# Patient Record
Sex: Female | Born: 1940 | Race: White | Hispanic: No | Smoking: Never smoker
Health system: Southern US, Community
[De-identification: ages and names within clinical notes are randomized; demographics above are authoritative.]

## PROBLEM LIST (undated history)

## (undated) DIAGNOSIS — I2510 Atherosclerotic heart disease of native coronary artery without angina pectoris: Secondary | ICD-10-CM

## (undated) DIAGNOSIS — C179 Malignant neoplasm of small intestine, unspecified: Secondary | ICD-10-CM

## (undated) DIAGNOSIS — D519 Vitamin B12 deficiency anemia, unspecified: Secondary | ICD-10-CM

## (undated) DIAGNOSIS — I209 Angina pectoris, unspecified: Secondary | ICD-10-CM

## (undated) DIAGNOSIS — M1990 Unspecified osteoarthritis, unspecified site: Secondary | ICD-10-CM

## (undated) DIAGNOSIS — F329 Major depressive disorder, single episode, unspecified: Secondary | ICD-10-CM

## (undated) DIAGNOSIS — D649 Anemia, unspecified: Secondary | ICD-10-CM

## (undated) DIAGNOSIS — Z8619 Personal history of other infectious and parasitic diseases: Secondary | ICD-10-CM

## (undated) DIAGNOSIS — I219 Acute myocardial infarction, unspecified: Secondary | ICD-10-CM

## (undated) DIAGNOSIS — R51 Headache: Secondary | ICD-10-CM

## (undated) DIAGNOSIS — J189 Pneumonia, unspecified organism: Secondary | ICD-10-CM

## (undated) DIAGNOSIS — C9110 Chronic lymphocytic leukemia of B-cell type not having achieved remission: Secondary | ICD-10-CM

## (undated) DIAGNOSIS — H269 Unspecified cataract: Secondary | ICD-10-CM

## (undated) DIAGNOSIS — M720 Palmar fascial fibromatosis [Dupuytren]: Secondary | ICD-10-CM

## (undated) DIAGNOSIS — Z860100 Personal history of colon polyps, unspecified: Secondary | ICD-10-CM

## (undated) DIAGNOSIS — I97630 Postprocedural hematoma of a circulatory system organ or structure following a cardiac catheterization: Secondary | ICD-10-CM

## (undated) DIAGNOSIS — K5190 Ulcerative colitis, unspecified, without complications: Secondary | ICD-10-CM

## (undated) DIAGNOSIS — K219 Gastro-esophageal reflux disease without esophagitis: Secondary | ICD-10-CM

## (undated) DIAGNOSIS — E119 Type 2 diabetes mellitus without complications: Secondary | ICD-10-CM

## (undated) DIAGNOSIS — I10 Essential (primary) hypertension: Secondary | ICD-10-CM

## (undated) DIAGNOSIS — Z9289 Personal history of other medical treatment: Secondary | ICD-10-CM

## (undated) DIAGNOSIS — E785 Hyperlipidemia, unspecified: Secondary | ICD-10-CM

## (undated) DIAGNOSIS — I872 Venous insufficiency (chronic) (peripheral): Secondary | ICD-10-CM

## (undated) DIAGNOSIS — F32A Depression, unspecified: Secondary | ICD-10-CM

## (undated) DIAGNOSIS — D689 Coagulation defect, unspecified: Secondary | ICD-10-CM

## (undated) DIAGNOSIS — D62 Acute posthemorrhagic anemia: Secondary | ICD-10-CM

## (undated) DIAGNOSIS — Z8719 Personal history of other diseases of the digestive system: Secondary | ICD-10-CM

## (undated) DIAGNOSIS — K2970 Gastritis, unspecified, without bleeding: Secondary | ICD-10-CM

## (undated) DIAGNOSIS — F419 Anxiety disorder, unspecified: Secondary | ICD-10-CM

## (undated) DIAGNOSIS — T7840XA Allergy, unspecified, initial encounter: Secondary | ICD-10-CM

## (undated) DIAGNOSIS — Z9889 Other specified postprocedural states: Secondary | ICD-10-CM

## (undated) HISTORY — DX: Personal history of colonic polyps: Z86.010

## (undated) HISTORY — DX: Coagulation defect, unspecified: D68.9

## (undated) HISTORY — DX: Atherosclerotic heart disease of native coronary artery without angina pectoris: I25.10

## (undated) HISTORY — DX: Palmar fascial fibromatosis (dupuytren): M72.0

## (undated) HISTORY — PX: CARDIAC CATHETERIZATION: SHX172

## (undated) HISTORY — PX: POLYPECTOMY: SHX149

## (undated) HISTORY — DX: Malignant neoplasm of small intestine, unspecified: C17.9

## (undated) HISTORY — DX: Unspecified osteoarthritis, unspecified site: M19.90

## (undated) HISTORY — DX: Venous insufficiency (chronic) (peripheral): I87.2

## (undated) HISTORY — DX: Anxiety disorder, unspecified: F41.9

## (undated) HISTORY — DX: Personal history of colon polyps, unspecified: Z86.0100

## (undated) HISTORY — DX: Ulcerative colitis, unspecified, without complications: K51.90

## (undated) HISTORY — DX: Personal history of other infectious and parasitic diseases: Z86.19

## (undated) HISTORY — DX: Essential (primary) hypertension: I10

## (undated) HISTORY — PX: TONSILLECTOMY: SUR1361

## (undated) HISTORY — PX: FRACTURE SURGERY: SHX138

## (undated) HISTORY — PX: SMALL INTESTINE SURGERY: SHX150

## (undated) HISTORY — PX: EYE SURGERY: SHX253

## (undated) HISTORY — DX: Gastritis, unspecified, without bleeding: K29.70

## (undated) HISTORY — DX: Acute myocardial infarction, unspecified: I21.9

## (undated) HISTORY — DX: Gastro-esophageal reflux disease without esophagitis: K21.9

## (undated) HISTORY — PX: HERNIA REPAIR: SHX51

## (undated) HISTORY — DX: Chronic lymphocytic leukemia of B-cell type not having achieved remission: C91.10

## (undated) HISTORY — DX: Headache: R51

## (undated) HISTORY — PX: COLONOSCOPY: SHX174

## (undated) HISTORY — DX: Allergy, unspecified, initial encounter: T78.40XA

## (undated) HISTORY — DX: Depression, unspecified: F32.A

## (undated) HISTORY — DX: Hyperlipidemia, unspecified: E78.5

## (undated) HISTORY — DX: Unspecified cataract: H26.9

## (undated) HISTORY — DX: Major depressive disorder, single episode, unspecified: F32.9

---

## 1964-01-03 HISTORY — PX: TUBAL LIGATION: SHX77

## 1994-11-03 HISTORY — PX: PATELLA FRACTURE SURGERY: SHX735

## 1994-12-03 HISTORY — PX: TOTAL KNEE ARTHROPLASTY WITH HARDWARE REMOVAL: SHX6437

## 1995-01-03 HISTORY — PX: SHOULDER ARTHROSCOPY W/ ROTATOR CUFF REPAIR: SHX2400

## 1995-01-03 HISTORY — PX: CORONARY ARTERY BYPASS GRAFT: SHX141

## 1997-06-18 ENCOUNTER — Inpatient Hospital Stay (HOSPITAL_COMMUNITY): Admission: EM | Admit: 1997-06-18 | Discharge: 1997-06-22 | Payer: Self-pay | Admitting: Emergency Medicine

## 1997-07-28 ENCOUNTER — Ambulatory Visit (HOSPITAL_COMMUNITY): Admission: RE | Admit: 1997-07-28 | Discharge: 1997-07-28 | Payer: Self-pay | Admitting: Internal Medicine

## 1997-07-31 ENCOUNTER — Ambulatory Visit (HOSPITAL_COMMUNITY): Admission: RE | Admit: 1997-07-31 | Discharge: 1997-07-31 | Payer: Self-pay | Admitting: Internal Medicine

## 1997-09-11 ENCOUNTER — Encounter: Payer: Self-pay | Admitting: Internal Medicine

## 1997-09-11 ENCOUNTER — Ambulatory Visit (HOSPITAL_COMMUNITY): Admission: RE | Admit: 1997-09-11 | Discharge: 1997-09-11 | Payer: Self-pay | Admitting: Internal Medicine

## 1997-11-10 ENCOUNTER — Encounter: Payer: Self-pay | Admitting: Internal Medicine

## 1997-11-10 ENCOUNTER — Ambulatory Visit (HOSPITAL_COMMUNITY): Admission: RE | Admit: 1997-11-10 | Discharge: 1997-11-10 | Payer: Self-pay | Admitting: Internal Medicine

## 1998-01-02 DIAGNOSIS — C179 Malignant neoplasm of small intestine, unspecified: Secondary | ICD-10-CM

## 1998-01-02 HISTORY — DX: Malignant neoplasm of small intestine, unspecified: C17.9

## 1998-01-29 ENCOUNTER — Encounter: Payer: Self-pay | Admitting: Internal Medicine

## 1998-01-29 ENCOUNTER — Ambulatory Visit (HOSPITAL_COMMUNITY): Admission: RE | Admit: 1998-01-29 | Discharge: 1998-01-29 | Payer: Self-pay | Admitting: Internal Medicine

## 1998-04-19 ENCOUNTER — Emergency Department (HOSPITAL_COMMUNITY): Admission: EM | Admit: 1998-04-19 | Discharge: 1998-04-20 | Payer: Self-pay | Admitting: Emergency Medicine

## 1998-04-28 ENCOUNTER — Encounter: Payer: Self-pay | Admitting: Internal Medicine

## 1998-04-28 ENCOUNTER — Inpatient Hospital Stay (HOSPITAL_COMMUNITY): Admission: EM | Admit: 1998-04-28 | Discharge: 1998-05-08 | Payer: Self-pay | Admitting: Emergency Medicine

## 1998-04-29 ENCOUNTER — Encounter: Payer: Self-pay | Admitting: Internal Medicine

## 1998-04-30 ENCOUNTER — Encounter: Payer: Self-pay | Admitting: Internal Medicine

## 1998-05-01 ENCOUNTER — Encounter: Payer: Self-pay | Admitting: Internal Medicine

## 1998-06-03 HISTORY — PX: OTHER SURGICAL HISTORY: SHX169

## 1998-06-07 ENCOUNTER — Encounter: Payer: Self-pay | Admitting: *Deleted

## 1998-06-07 ENCOUNTER — Ambulatory Visit (HOSPITAL_COMMUNITY): Admission: RE | Admit: 1998-06-07 | Discharge: 1998-06-07 | Payer: Self-pay | Admitting: *Deleted

## 1998-11-24 ENCOUNTER — Ambulatory Visit (HOSPITAL_COMMUNITY): Admission: RE | Admit: 1998-11-24 | Discharge: 1998-11-24 | Payer: Self-pay | Admitting: Hematology and Oncology

## 1998-11-24 ENCOUNTER — Encounter: Payer: Self-pay | Admitting: Hematology and Oncology

## 1998-12-24 ENCOUNTER — Encounter: Admission: RE | Admit: 1998-12-24 | Discharge: 1998-12-24 | Payer: Self-pay | Admitting: Hematology and Oncology

## 1998-12-24 ENCOUNTER — Encounter: Payer: Self-pay | Admitting: Hematology and Oncology

## 1998-12-31 ENCOUNTER — Ambulatory Visit (HOSPITAL_BASED_OUTPATIENT_CLINIC_OR_DEPARTMENT_OTHER): Admission: RE | Admit: 1998-12-31 | Discharge: 1998-12-31 | Payer: Self-pay | Admitting: *Deleted

## 1999-05-03 ENCOUNTER — Other Ambulatory Visit: Admission: RE | Admit: 1999-05-03 | Discharge: 1999-05-03 | Payer: Self-pay | Admitting: Gynecology

## 1999-05-26 ENCOUNTER — Encounter: Admission: RE | Admit: 1999-05-26 | Discharge: 1999-05-26 | Payer: Self-pay | Admitting: Hematology and Oncology

## 1999-05-26 ENCOUNTER — Encounter: Payer: Self-pay | Admitting: Hematology and Oncology

## 2000-06-29 ENCOUNTER — Encounter: Payer: Self-pay | Admitting: Internal Medicine

## 2000-06-29 ENCOUNTER — Ambulatory Visit (HOSPITAL_COMMUNITY): Admission: RE | Admit: 2000-06-29 | Discharge: 2000-06-29 | Payer: Self-pay | Admitting: Internal Medicine

## 2000-09-18 ENCOUNTER — Encounter: Admission: RE | Admit: 2000-09-18 | Discharge: 2000-09-18 | Payer: Self-pay | Admitting: Hematology and Oncology

## 2000-09-18 ENCOUNTER — Encounter: Payer: Self-pay | Admitting: Hematology and Oncology

## 2000-10-04 ENCOUNTER — Encounter: Admission: RE | Admit: 2000-10-04 | Discharge: 2000-10-04 | Payer: Self-pay | Admitting: Hematology and Oncology

## 2000-10-04 ENCOUNTER — Encounter: Payer: Self-pay | Admitting: Hematology and Oncology

## 2003-12-30 ENCOUNTER — Ambulatory Visit: Payer: Self-pay | Admitting: Pulmonary Disease

## 2003-12-31 ENCOUNTER — Ambulatory Visit: Payer: Self-pay | Admitting: Pulmonary Disease

## 2004-04-01 ENCOUNTER — Ambulatory Visit: Payer: Self-pay | Admitting: Pulmonary Disease

## 2004-04-06 ENCOUNTER — Ambulatory Visit (HOSPITAL_COMMUNITY): Admission: RE | Admit: 2004-04-06 | Discharge: 2004-04-06 | Payer: Self-pay | Admitting: Cardiovascular Disease

## 2004-10-26 ENCOUNTER — Ambulatory Visit: Payer: Self-pay | Admitting: Pulmonary Disease

## 2004-11-21 ENCOUNTER — Ambulatory Visit: Payer: Self-pay | Admitting: Internal Medicine

## 2004-11-25 ENCOUNTER — Ambulatory Visit: Payer: Self-pay | Admitting: Cardiology

## 2004-12-15 ENCOUNTER — Ambulatory Visit: Payer: Self-pay | Admitting: Internal Medicine

## 2004-12-22 ENCOUNTER — Ambulatory Visit: Payer: Self-pay | Admitting: Internal Medicine

## 2005-01-05 ENCOUNTER — Ambulatory Visit (HOSPITAL_COMMUNITY): Admission: RE | Admit: 2005-01-05 | Discharge: 2005-01-05 | Payer: Self-pay | Admitting: Internal Medicine

## 2005-01-05 ENCOUNTER — Ambulatory Visit: Payer: Self-pay | Admitting: Internal Medicine

## 2005-03-06 ENCOUNTER — Ambulatory Visit: Payer: Self-pay | Admitting: Pulmonary Disease

## 2005-03-20 ENCOUNTER — Ambulatory Visit: Payer: Self-pay | Admitting: Pulmonary Disease

## 2005-11-28 ENCOUNTER — Ambulatory Visit: Payer: Self-pay | Admitting: Pulmonary Disease

## 2006-04-24 ENCOUNTER — Ambulatory Visit: Payer: Self-pay | Admitting: Internal Medicine

## 2006-04-24 LAB — CONVERTED CEMR LAB
Albumin: 4.1 g/dL (ref 3.5–5.2)
BUN: 25 mg/dL — ABNORMAL HIGH (ref 6–23)
Basophils Absolute: 0 10*3/uL (ref 0.0–0.1)
Basophils Relative: 0.4 % (ref 0.0–1.0)
Eosinophils Absolute: 0.1 10*3/uL (ref 0.0–0.6)
GFR calc Af Amer: 64 mL/min
GFR calc non Af Amer: 53 mL/min
Iron: 63 ug/dL (ref 42–145)
Lymphocytes Relative: 42 % (ref 12.0–46.0)
MCHC: 34.4 g/dL (ref 30.0–36.0)
MCV: 90.7 fL (ref 78.0–100.0)
Monocytes Relative: 9.9 % (ref 3.0–11.0)
Neutro Abs: 3.3 10*3/uL (ref 1.4–7.7)
Phosphorus: 4 mg/dL (ref 2.3–4.6)
Platelets: 272 10*3/uL (ref 150–400)
Potassium: 3.8 meq/L (ref 3.5–5.1)
Sodium: 141 meq/L (ref 135–145)

## 2006-04-27 ENCOUNTER — Ambulatory Visit: Payer: Self-pay | Admitting: Cardiology

## 2006-06-04 ENCOUNTER — Ambulatory Visit: Payer: Self-pay | Admitting: Pulmonary Disease

## 2006-06-04 LAB — CONVERTED CEMR LAB
Alkaline Phosphatase: 69 units/L (ref 39–117)
BUN: 22 mg/dL (ref 6–23)
Bilirubin, Direct: 0.1 mg/dL (ref 0.0–0.3)
CO2: 27 meq/L (ref 19–32)
Eosinophils Relative: 1.5 % (ref 0.0–5.0)
GFR calc Af Amer: 71 mL/min
HCT: 31.3 % — ABNORMAL LOW (ref 36.0–46.0)
LDL Cholesterol: 46 mg/dL (ref 0–99)
Monocytes Absolute: 0.6 10*3/uL (ref 0.2–0.7)
Neutro Abs: 3.7 10*3/uL (ref 1.4–7.7)
Platelets: 196 10*3/uL (ref 150–400)
Potassium: 4.4 meq/L (ref 3.5–5.1)
RBC: 3.38 M/uL — ABNORMAL LOW (ref 3.87–5.11)
RDW: 12.8 % (ref 11.5–14.6)
TSH: 0.39 microintl units/mL (ref 0.35–5.50)
Total CHOL/HDL Ratio: 2.9
Total Protein: 6.6 g/dL (ref 6.0–8.3)
Triglycerides: 155 mg/dL — ABNORMAL HIGH (ref 0–149)
WBC: 7 10*3/uL (ref 4.5–10.5)

## 2006-10-16 DIAGNOSIS — Z87898 Personal history of other specified conditions: Secondary | ICD-10-CM

## 2006-10-16 DIAGNOSIS — Z951 Presence of aortocoronary bypass graft: Secondary | ICD-10-CM

## 2006-10-16 DIAGNOSIS — E785 Hyperlipidemia, unspecified: Secondary | ICD-10-CM | POA: Insufficient documentation

## 2006-10-16 DIAGNOSIS — M1990 Unspecified osteoarthritis, unspecified site: Secondary | ICD-10-CM

## 2006-10-16 DIAGNOSIS — Z794 Long term (current) use of insulin: Secondary | ICD-10-CM

## 2006-10-16 DIAGNOSIS — K219 Gastro-esophageal reflux disease without esophagitis: Secondary | ICD-10-CM | POA: Insufficient documentation

## 2006-10-16 DIAGNOSIS — E1122 Type 2 diabetes mellitus with diabetic chronic kidney disease: Secondary | ICD-10-CM | POA: Insufficient documentation

## 2006-10-16 DIAGNOSIS — I10 Essential (primary) hypertension: Secondary | ICD-10-CM | POA: Insufficient documentation

## 2006-10-16 DIAGNOSIS — E118 Type 2 diabetes mellitus with unspecified complications: Secondary | ICD-10-CM

## 2006-12-20 ENCOUNTER — Ambulatory Visit: Payer: Self-pay | Admitting: Pulmonary Disease

## 2006-12-20 DIAGNOSIS — I872 Venous insufficiency (chronic) (peripheral): Secondary | ICD-10-CM | POA: Insufficient documentation

## 2006-12-20 DIAGNOSIS — C179 Malignant neoplasm of small intestine, unspecified: Secondary | ICD-10-CM | POA: Insufficient documentation

## 2006-12-20 DIAGNOSIS — D126 Benign neoplasm of colon, unspecified: Secondary | ICD-10-CM | POA: Insufficient documentation

## 2007-01-17 ENCOUNTER — Encounter: Payer: Self-pay | Admitting: Pulmonary Disease

## 2007-02-15 LAB — CONVERTED CEMR LAB
Albumin: 3.9 g/dL (ref 3.5–5.2)
Alkaline Phosphatase: 73 units/L (ref 39–117)
BUN: 20 mg/dL (ref 6–23)
Basophils Absolute: 0 10*3/uL (ref 0.0–0.1)
Cholesterol: 129 mg/dL (ref 0–200)
Direct LDL: 55.8 mg/dL
GFR calc Af Amer: 71 mL/min
Hemoglobin: 12.6 g/dL (ref 12.0–15.0)
Hgb A1c MFr Bld: 7.2 % — ABNORMAL HIGH (ref 4.6–6.0)
Iron: 66 ug/dL (ref 42–145)
Lymphocytes Relative: 37.5 % (ref 12.0–46.0)
MCHC: 35.1 g/dL (ref 30.0–36.0)
MCV: 92.1 fL (ref 78.0–100.0)
Monocytes Absolute: 0.5 10*3/uL (ref 0.2–0.7)
Monocytes Relative: 7.6 % (ref 3.0–11.0)
Neutro Abs: 3.5 10*3/uL (ref 1.4–7.7)
Potassium: 4.4 meq/L (ref 3.5–5.1)
Total CHOL/HDL Ratio: 3.5
Total Protein: 7 g/dL (ref 6.0–8.3)

## 2007-06-21 ENCOUNTER — Ambulatory Visit: Payer: Self-pay | Admitting: Pulmonary Disease

## 2007-06-22 LAB — CONVERTED CEMR LAB
ALT: 13 units/L (ref 0–35)
Albumin: 4 g/dL (ref 3.5–5.2)
BUN: 17 mg/dL (ref 6–23)
Basophils Relative: 0.2 % (ref 0.0–1.0)
Bilirubin, Direct: 0.1 mg/dL (ref 0.0–0.3)
CO2: 29 meq/L (ref 19–32)
Calcium: 9.4 mg/dL (ref 8.4–10.5)
Cholesterol: 113 mg/dL (ref 0–200)
Creatinine, Ser: 1 mg/dL (ref 0.4–1.2)
GFR calc Af Amer: 71 mL/min
Glucose, Bld: 210 mg/dL — ABNORMAL HIGH (ref 70–99)
HCT: 33.8 % — ABNORMAL LOW (ref 36.0–46.0)
Hemoglobin: 11.9 g/dL — ABNORMAL LOW (ref 12.0–15.0)
Hgb A1c MFr Bld: 8 % — ABNORMAL HIGH (ref 4.6–6.0)
Monocytes Absolute: 0.6 10*3/uL (ref 0.1–1.0)
Monocytes Relative: 7.6 % (ref 3.0–12.0)
Neutro Abs: 4.6 10*3/uL (ref 1.4–7.7)
RBC: 3.67 M/uL — ABNORMAL LOW (ref 3.87–5.11)
RDW: 12.1 % (ref 11.5–14.6)
Sodium: 143 meq/L (ref 135–145)
TSH: 0.38 microintl units/mL (ref 0.35–5.50)
Total CHOL/HDL Ratio: 3.3
Total Protein: 6.8 g/dL (ref 6.0–8.3)
Triglycerides: 162 mg/dL — ABNORMAL HIGH (ref 0–149)

## 2007-06-24 ENCOUNTER — Encounter: Payer: Self-pay | Admitting: Pulmonary Disease

## 2007-08-01 ENCOUNTER — Telehealth (INDEPENDENT_AMBULATORY_CARE_PROVIDER_SITE_OTHER): Payer: Self-pay | Admitting: *Deleted

## 2007-08-30 ENCOUNTER — Ambulatory Visit: Payer: Self-pay | Admitting: Internal Medicine

## 2007-09-02 LAB — CONVERTED CEMR LAB
Bilirubin Urine: NEGATIVE
Hemoglobin, Urine: NEGATIVE
Ketones, ur: NEGATIVE mg/dL
Leukocytes, UA: NEGATIVE
Nitrite: NEGATIVE
Urobilinogen, UA: 0.2 (ref 0.0–1.0)

## 2007-09-17 ENCOUNTER — Ambulatory Visit: Payer: Self-pay | Admitting: Pulmonary Disease

## 2007-09-20 LAB — CONVERTED CEMR LAB
Fecal Occult Blood: NEGATIVE
OCCULT 3: NEGATIVE

## 2007-11-14 ENCOUNTER — Encounter: Payer: Self-pay | Admitting: Pulmonary Disease

## 2007-12-09 ENCOUNTER — Ambulatory Visit: Payer: Self-pay | Admitting: Pulmonary Disease

## 2008-06-29 ENCOUNTER — Ambulatory Visit: Payer: Self-pay | Admitting: Internal Medicine

## 2008-06-30 ENCOUNTER — Ambulatory Visit: Payer: Self-pay | Admitting: Adult Health

## 2008-07-02 LAB — CONVERTED CEMR LAB
AST: 22 units/L (ref 0–37)
Albumin: 3.7 g/dL (ref 3.5–5.2)
Basophils Relative: 0.4 % (ref 0.0–3.0)
CO2: 28 meq/L (ref 19–32)
Chloride: 108 meq/L (ref 96–112)
Cholesterol: 128 mg/dL (ref 0–200)
Direct LDL: 51 mg/dL
Eosinophils Relative: 2 % (ref 0.0–5.0)
HCT: 34.2 % — ABNORMAL LOW (ref 36.0–46.0)
Hgb A1c MFr Bld: 7.9 % — ABNORMAL HIGH (ref 4.6–6.5)
Lymphs Abs: 2.4 10*3/uL (ref 0.7–4.0)
MCV: 94 fL (ref 78.0–100.0)
Monocytes Absolute: 0.6 10*3/uL (ref 0.1–1.0)
Monocytes Relative: 9.5 % (ref 3.0–12.0)
Neutrophils Relative %: 51.1 % (ref 43.0–77.0)
Nitrite: NEGATIVE
Platelets: 175 10*3/uL (ref 150.0–400.0)
Potassium: 4.8 meq/L (ref 3.5–5.1)
RBC: 3.64 M/uL — ABNORMAL LOW (ref 3.87–5.11)
Specific Gravity, Urine: 1.02 (ref 1.000–1.030)
TSH: 0.95 microintl units/mL (ref 0.35–5.50)
Total Bilirubin: 0.6 mg/dL (ref 0.3–1.2)
Total CHOL/HDL Ratio: 3
Total Protein, Urine: NEGATIVE mg/dL
Triglycerides: 298 mg/dL — ABNORMAL HIGH (ref 0.0–149.0)
VLDL: 59.6 mg/dL — ABNORMAL HIGH (ref 0.0–40.0)
WBC: 6.6 10*3/uL (ref 4.5–10.5)
pH: 5.5 (ref 5.0–8.0)

## 2008-07-28 ENCOUNTER — Encounter: Payer: Self-pay | Admitting: Internal Medicine

## 2008-08-15 ENCOUNTER — Inpatient Hospital Stay (HOSPITAL_COMMUNITY): Admission: EM | Admit: 2008-08-15 | Discharge: 2008-08-17 | Payer: Self-pay | Admitting: Emergency Medicine

## 2008-08-15 ENCOUNTER — Encounter (INDEPENDENT_AMBULATORY_CARE_PROVIDER_SITE_OTHER): Payer: Self-pay | Admitting: *Deleted

## 2008-08-15 ENCOUNTER — Ambulatory Visit: Payer: Self-pay | Admitting: Pulmonary Disease

## 2008-08-17 ENCOUNTER — Telehealth (INDEPENDENT_AMBULATORY_CARE_PROVIDER_SITE_OTHER): Payer: Self-pay | Admitting: *Deleted

## 2008-08-17 ENCOUNTER — Telehealth: Payer: Self-pay | Admitting: Internal Medicine

## 2008-08-27 DIAGNOSIS — Z8719 Personal history of other diseases of the digestive system: Secondary | ICD-10-CM

## 2008-08-27 DIAGNOSIS — K439 Ventral hernia without obstruction or gangrene: Secondary | ICD-10-CM | POA: Insufficient documentation

## 2008-09-01 ENCOUNTER — Ambulatory Visit: Payer: Self-pay | Admitting: Internal Medicine

## 2008-09-02 HISTORY — PX: LAPAROSCOPIC ASSISTED VENTRAL HERNIA REPAIR: SHX6312

## 2008-09-02 LAB — CONVERTED CEMR LAB
Ferritin: 30.4 ng/mL (ref 10.0–291.0)
Iron: 63 ug/dL (ref 42–145)

## 2008-09-09 ENCOUNTER — Ambulatory Visit (HOSPITAL_COMMUNITY): Admission: RE | Admit: 2008-09-09 | Discharge: 2008-09-09 | Payer: Self-pay | Admitting: Internal Medicine

## 2008-09-11 ENCOUNTER — Encounter: Payer: Self-pay | Admitting: Internal Medicine

## 2008-09-11 ENCOUNTER — Encounter: Payer: Self-pay | Admitting: Pulmonary Disease

## 2008-09-18 ENCOUNTER — Encounter: Admission: RE | Admit: 2008-09-18 | Discharge: 2008-09-18 | Payer: Self-pay | Admitting: Surgery

## 2008-09-22 ENCOUNTER — Inpatient Hospital Stay (HOSPITAL_COMMUNITY): Admission: RE | Admit: 2008-09-22 | Discharge: 2008-09-27 | Payer: Self-pay | Admitting: Surgery

## 2008-09-22 ENCOUNTER — Ambulatory Visit: Payer: Self-pay | Admitting: Pulmonary Disease

## 2008-09-30 ENCOUNTER — Telehealth (INDEPENDENT_AMBULATORY_CARE_PROVIDER_SITE_OTHER): Payer: Self-pay | Admitting: *Deleted

## 2008-10-14 ENCOUNTER — Encounter: Payer: Self-pay | Admitting: Internal Medicine

## 2008-11-05 ENCOUNTER — Ambulatory Visit: Payer: Self-pay | Admitting: Pulmonary Disease

## 2008-11-05 LAB — CONVERTED CEMR LAB
AST: 26 units/L (ref 0–37)
Alkaline Phosphatase: 53 units/L (ref 39–117)
Basophils Absolute: 0.1 10*3/uL (ref 0.0–0.1)
Calcium: 9.7 mg/dL (ref 8.4–10.5)
Eosinophils Relative: 1.7 % (ref 0.0–5.0)
GFR calc non Af Amer: 39.61 mL/min (ref >60)
Glucose, Bld: 189 mg/dL — ABNORMAL HIGH (ref 70–99)
HDL: 48.9 mg/dL (ref >39.00)
Hemoglobin: 11.8 g/dL — ABNORMAL LOW (ref 12.0–15.0)
Lymphocytes Relative: 33.5 % (ref 12.0–46.0)
Monocytes Relative: 5.8 % (ref 3.0–12.0)
Neutro Abs: 6 10*3/uL (ref 1.4–7.7)
Platelets: 241 10*3/uL (ref 150.0–400.0)
RDW: 12.4 % (ref 11.5–14.6)
Sodium: 141 meq/L (ref 135–145)
Total Bilirubin: 0.7 mg/dL (ref 0.3–1.2)
Total CHOL/HDL Ratio: 4
Triglycerides: 347 mg/dL — ABNORMAL HIGH (ref 0.0–149.0)
VLDL: 69.4 mg/dL — ABNORMAL HIGH (ref 0.0–40.0)
WBC: 10.4 10*3/uL (ref 4.5–10.5)

## 2008-11-17 ENCOUNTER — Telehealth: Payer: Self-pay | Admitting: Pulmonary Disease

## 2008-11-17 LAB — CONVERTED CEMR LAB: Vit D, 25-Hydroxy: 25 ng/mL — ABNORMAL LOW (ref 30–89)

## 2008-11-18 ENCOUNTER — Telehealth: Payer: Self-pay | Admitting: Pulmonary Disease

## 2008-12-04 ENCOUNTER — Encounter: Payer: Self-pay | Admitting: Internal Medicine

## 2008-12-21 ENCOUNTER — Encounter: Payer: Self-pay | Admitting: Pulmonary Disease

## 2009-05-04 ENCOUNTER — Ambulatory Visit: Payer: Self-pay | Admitting: Pulmonary Disease

## 2009-05-05 ENCOUNTER — Ambulatory Visit: Payer: Self-pay | Admitting: Pulmonary Disease

## 2009-05-09 LAB — CONVERTED CEMR LAB
Albumin: 3.8 g/dL (ref 3.5–5.2)
Basophils Relative: 0.3 % (ref 0.0–3.0)
Chloride: 105 meq/L (ref 96–112)
Cholesterol: 147 mg/dL (ref 0–200)
Eosinophils Relative: 1.8 % (ref 0.0–5.0)
HCT: 33 % — ABNORMAL LOW (ref 36.0–46.0)
HDL: 40.3 mg/dL (ref >39.00)
Hgb A1c MFr Bld: 7.4 % — ABNORMAL HIGH (ref 4.6–6.5)
Lymphs Abs: 4.5 10*3/uL — ABNORMAL HIGH (ref 0.7–4.0)
MCV: 93.4 fL (ref 78.0–100.0)
Monocytes Absolute: 0.6 10*3/uL (ref 0.1–1.0)
Neutrophils Relative %: 32.7 % — ABNORMAL LOW (ref 43.0–77.0)
Potassium: 4.7 meq/L (ref 3.5–5.1)
RBC: 3.53 M/uL — ABNORMAL LOW (ref 3.87–5.11)
Sodium: 142 meq/L (ref 135–145)
Total Protein: 6.4 g/dL (ref 6.0–8.3)
VLDL: 61.4 mg/dL — ABNORMAL HIGH (ref 0.0–40.0)
WBC: 7.7 10*3/uL (ref 4.5–10.5)

## 2009-05-13 ENCOUNTER — Encounter: Payer: Self-pay | Admitting: Pulmonary Disease

## 2009-05-19 ENCOUNTER — Ambulatory Visit: Payer: Self-pay | Admitting: Internal Medicine

## 2009-05-20 ENCOUNTER — Ambulatory Visit: Payer: Self-pay | Admitting: Internal Medicine

## 2009-05-20 DIAGNOSIS — E539 Vitamin B deficiency, unspecified: Secondary | ICD-10-CM

## 2009-06-02 ENCOUNTER — Ambulatory Visit: Payer: Self-pay | Admitting: Internal Medicine

## 2009-06-03 ENCOUNTER — Encounter: Payer: Self-pay | Admitting: Internal Medicine

## 2009-06-16 ENCOUNTER — Ambulatory Visit: Payer: Self-pay | Admitting: Gastroenterology

## 2009-06-16 ENCOUNTER — Telehealth (INDEPENDENT_AMBULATORY_CARE_PROVIDER_SITE_OTHER): Payer: Self-pay | Admitting: *Deleted

## 2009-07-12 ENCOUNTER — Telehealth (INDEPENDENT_AMBULATORY_CARE_PROVIDER_SITE_OTHER): Payer: Self-pay | Admitting: *Deleted

## 2009-07-15 ENCOUNTER — Ambulatory Visit: Payer: Self-pay | Admitting: Gastroenterology

## 2009-07-30 ENCOUNTER — Encounter: Payer: Self-pay | Admitting: Pulmonary Disease

## 2009-08-16 ENCOUNTER — Ambulatory Visit: Payer: Self-pay | Admitting: Internal Medicine

## 2009-08-18 LAB — CONVERTED CEMR LAB
CEA: 0.9 ng/mL (ref 0.0–5.0)
Saturation Ratios: 23.9 % (ref 20.0–50.0)
Vitamin B-12: 250 pg/mL (ref 211–911)

## 2009-09-17 ENCOUNTER — Ambulatory Visit: Payer: Self-pay | Admitting: Internal Medicine

## 2009-10-13 ENCOUNTER — Encounter: Admission: RE | Admit: 2009-10-13 | Discharge: 2009-10-13 | Payer: Self-pay | Admitting: Pulmonary Disease

## 2009-10-15 ENCOUNTER — Ambulatory Visit: Payer: Self-pay | Admitting: Internal Medicine

## 2009-11-16 ENCOUNTER — Telehealth: Payer: Self-pay | Admitting: Internal Medicine

## 2009-11-16 ENCOUNTER — Ambulatory Visit: Payer: Self-pay | Admitting: Internal Medicine

## 2009-11-22 ENCOUNTER — Encounter: Payer: Self-pay | Admitting: Internal Medicine

## 2009-11-22 ENCOUNTER — Encounter: Payer: Self-pay | Admitting: Pulmonary Disease

## 2009-12-16 ENCOUNTER — Ambulatory Visit: Payer: Self-pay | Admitting: Internal Medicine

## 2009-12-31 ENCOUNTER — Ambulatory Visit: Payer: Self-pay | Admitting: Pulmonary Disease

## 2010-01-14 ENCOUNTER — Ambulatory Visit
Admission: RE | Admit: 2010-01-14 | Discharge: 2010-01-14 | Payer: Self-pay | Source: Home / Self Care | Attending: Internal Medicine | Admitting: Internal Medicine

## 2010-02-01 NOTE — Assessment & Plan Note (Signed)
Summary: MONTHLY B12 SHOT...LSW  Nurse Visit   Allergies: 1)  ! Advil 2)  Codeine 3)  * Aleve 4)  Demerol  Medication Administration  Injection # 1:    Medication: Vit B12 1000 mcg    Diagnosis: VITAMIN B12 DEFICIENCY (ICD-266.2)    Route: IM    Site: L deltoid    Exp Date: 12/2011    Lot #: [id number]    Mfr: American Regent    Patient tolerated injection without complications    Given by: Christie Nottingham CMA (AAMA) (July 15, 2009 10:01 AM)  Orders Added: 1)  Vit B12 1000 mcg [J3420]

## 2010-02-01 NOTE — Letter (Signed)
Summary: Patient Notice- Polyp Results  [facility]  [address] [place], [place] [address]   Phone: [phone]  Fax: [phone]        June 03, 2009 MRN: [id number]    [facility] [address] [place], [place]  [address]    Dear Ms. Rosalene Billings,  I am pleased to inform you that the colon polyp(s) removed during your recent colonoscopy was (were) found to be benign (no cancer detected) upon pathologic examination.The polyp is tubular adenoma ( precancerous)  I recommend you have a repeat colonoscopy examination in 5_ years to look for recurrent polyps, as having colon polyps increases your risk for having recurrent polyps or even colon cancer in the future.  Should you develop new or worsening symptoms of abdominal pain, bowel habit changes or bleeding from the rectum or bowels, please schedule an evaluation with either your primary care physician or with me.  Additional information/recommendations:  _x_ No further action with gastroenterology is needed at this time. Please      follow-up with your primary care physician for your other healthcare      needs.  __ Please call [phone] to schedule a return visit to review your      situation.  __ Please keep your follow-up visit as already scheduled.  __ Continue treatment plan as outlined the day of your exam.  Please call us if you are having persistent problems or have questions about your condition that have not been fully answered at this time.  Sincerely,  Hart Carwin MD  This letter has been electronically signed by your physician.  Appended Document: Patient Notice- Polyp Results Recall is in IDX for 06/2014. Letter mailed to patient.

## 2010-02-01 NOTE — Progress Notes (Signed)
Summary: Lab Results   Phone Note Call from Patient Call back at Home Phone [phone]   Caller: Patient Call For: Dr. Juanda Chance Reason for Call: Talk to Nurse Details for Reason: Lab Results Summary of Call: Pt. called to see if lab results were available. Pt. feels the B12 injections are not working for her.  Initial call taken by: Schuyler Amor,  November 16, 2009 2:41 PM  Follow-up for Phone Call        I have left a message on patient's voicemail that b12 levels are not available to [place] yet and Dr Juanda Chance must review them once they are back and that may take 1-2 days. Patient advised we will call her when results are available and have been reviewed at which time we can give her recommendations. Follow-up by: Lamona Curl CMA Duncan Dull),  November 16, 2009 3:04 PM

## 2010-02-01 NOTE — Letter (Signed)
Summary: [facility]  [facility]   Imported By: Sherian Rein 06/01/2009 13:26:54  _____________________________________________________________________  External Attachment:    Type:   Image     Comment:   External Document

## 2010-02-01 NOTE — Assessment & Plan Note (Signed)
Summary: B12 SHOT  Nurse Visit   Allergies: 1)  ! Advil 2)  Codeine 3)  * Aleve 4)  Demerol  Medication Administration  Injection # 1:    Medication: Vit B12 1000 mcg    Diagnosis: VITAMIN B12 DEFICIENCY (ICD-266.2)    Route: IM    Site: L deltoid    Exp Date: 7/13    Lot #: [id number]    Mfr: American Regent    Patient tolerated injection without complications    Given by: Lamona Curl CMA (AAMA) (September 17, 2009 11:37 AM)  Orders Added: 1)  Vit B12 1000 mcg [J3420]   Medication Administration  Injection # 1:    Medication: Vit B12 1000 mcg    Diagnosis: VITAMIN B12 DEFICIENCY (ICD-266.2)    Route: IM    Site: L deltoid    Exp Date: 7/13    Lot #: [id number]    Mfr: American Regent    Patient tolerated injection without complications    Given by: Lamona Curl CMA (AAMA) (September 17, 2009 11:37 AM)  Orders Added: 1)  Vit B12 1000 mcg [J3420]

## 2010-02-01 NOTE — Assessment & Plan Note (Signed)
Summary: rov 6 months///kp   CC:  6 month follow up--needs rx for cloraZepate and actos--both ears are stopped up and drain at night--abd pain at times that hurts most all of the time.  History of Present Illness: 70 y/o WF here for a follow up visit... she has mult med problems as noted below...   ~  November 05, 2008:  she was last seen in office 6/09 & hosp x2 recently- 8/13-16 and 9/21-26/10 w/ ventral hernia containing incarcerated colon... had lap ventral hernia repair by [facility] on 09/22/08... she has improved but still weak, wt down 5#, feeling sl depressed & asking for Lexapro Rx... she's already had the 2010 flu vaccine...    ~  May 04, 2009:  17mo f/u & feeling better overall but still has some GI issuesand needs ROV w/ DrDBrodie... hasn't been taking Prilosec & will write for OMEP40 daily before dinner... also c/o hear discomfort & exam shows 4+wax w/ prev disimpaction by [facility] & she will f/u w/ him as well... she denies CP, BP controlled on meds, due for f/u FLP & DM labs- she wants to start back on Actos she says...    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 denies CP, palpit, syncope, edema, etc.  ~  Myoview 12/08 showed perfusion defect w/ infarct/ scar & perinfarct ischemia...  ~  subseq Cath 12/08 showed progressive CIRC dis & patent grafts- med Rx...  ~  seen by [name]/ [facility] 9/10 w/ Myoview & 2DEcho- cleared for ventral hernia surg...  HYPERTENSION (ICD-401.9) - on ATENOLOL 50mg /d, ALTACE 10mg /d, HCTZ 25mg /d... BP= 140/80, feeling better, taking meds regularly... denies HA, visual changes, CP, palipit, syncope, dyspnea, edema, etc...   VENOUS INSUFFICIENCY (ICD-459.81) - on low sodium diet, elevates legs, wear support hose when able...  HYPERLIPIDEMIA (ICD-272.4) - on SIMVASTATIN 40mg /d & NIASPAN 500mg /d... tolerates meds fair & takes her ASA prior  to the Niacin Qhs... prev on Lip20 but stopped due to $$...  ~  FLP 6/08 on Lip20 showed TChol 118, TG 155, HDL 41, LDL 46  ~  FLP 12/08 on Lip20 showed TChol 129, TG 201, HDL 37, LDL 56  ~  FLP 6/09 on Lip20 showed TChol 113, TG 162, HDL 34, LDL 47  ~  FLP 11/10 on Simva40+Niasp500 showed TChol 183, TG 347, HDL 49, LDL 72...  ~  FLP 5/11 on Simva40+Niasp500 showed   DM (ICD-250.00) - on METFORMIN 1000mg Bid, & AMARYL 4mg /d...  ~  labs 6/08 showed BS 110, HgA1C 6.6  ~  labs 12/08 showed BS= 170, HgA1c= 7.2 (she was up 10# in weight)  ~  labs 6/09 showed BS= 210, A1c= 8.0.Marland Kitchen. rec> add Actos 15mg /d but pt never did due to $$.  ~  labs 11/10 showed BS= 189, A1c= 7.6  ~  she refuses Januvia- "it made my sugar go higher"  ~  5/11> she is asking to restart ACTOS 30mg /d because home BS= 150-200 range.  ~  labs 5/11 showed > pending  GERD (ICD-530.81) - prev on Prilosec OTC Prn- rec ch to OMEPRAZOLE 40mg /d before dinner...  ~  last EGD 12/06 by DrDBrodie showed mild antral gastritis, RUT neg, ?from ASA... Rx'd Pepcid 40mg /d.  ~  5/11:  c/o indigestion in eves> rec Omep40 before dinner & antireflux regimen... f/u w/ GI.  COLONIC POLYPS (ICD-211.3)  ~  colonoscopy 12/06 by DrDBrodie showed 8mm  polyp in sigmoid area= ?path not avail...  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 post op oncology DrShinn w/ adjuvant chemotherapy- 5-FU/ Leucovorin x 26weeks finished 12/00...  ~  she has been followed by DrDBrodie since DrShinn left [place]...  ~  small bowel capsule endoscopy 1/07 was neg- no recurrent tumor seen, 2 tiny AVM's noted...  ~  CT Abd/ Pelvis 8/10 w/ large ventral hernia containing transverse colon & mesentery- no obstruction, ?hep steatosis,  lumbar DJD.  ~  UGI & Sm Bowel Series 9/10 showed no HH or reflux seem, ventral hernia w/ transverse colon, no obstruction or mass in sm bowel.  HERNIA, VENTRAL (ICD-553.20) ** see 9/10 Hosp>> lap ventral hernia repairw/ reduction incarcerated colon...  DEGENERATIVE JOINT DISEASE (ICD-715.90) - she is s/p left knee arthroscopy 1996...  HEADACHE (ICD-784.0) - hx mixed HA's in the past...  DEPRESSION (ICD-311) - husb passed away several yrs ago...  ~  11/10: pt requesting LEXAPRO 10mg /d... in f/u she notes improvement & decided to stop Rx.  SHINGLES, HX OF (ICD-V13.8)  ANEMIA, HX OF (ICD-V12.3)  ~  labs in hosp 9/10 showed Hg down to 9.7 at disch...  ~  labs 11/10 showed Hg= 11.8  ~  labs 5/11 showed    Allergies: 1)  ! Advil 2)  Codeine 3)  * Aleve 4)  Demerol  Comments:  Nurse/Medical Assistant: The patient's medications and allergies were reviewed with the patient and were updated in the Medication and Allergy Lists.  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 mother died age 31 from heart attack father died age 77 from massive heart attack 1 sibling 1 brother died age 27 from emphysema Family History Rheumatoid Arthritis No FH of Colon Cancer:  Social  History: Reviewed history from 08/27/2008 and no changes required. retired widowed 2 children never smoked exercises 3 x a week drinks caffeinated sodas  Review of Systems      See HPI       The patient complains of decreased hearing, dyspnea on exertion, and severe indigestion/heartburn.  The patient denies anorexia, fever, weight loss, weight gain, vision loss, hoarseness, chest pain, syncope, peripheral edema, prolonged cough, headaches, hemoptysis, abdominal pain, melena, hematochezia, hematuria, incontinence, muscle weakness, suspicious skin lesions, transient blindness, difficulty walking, depression, unusual weight change, abnormal bleeding, enlarged lymph nodes, and angioedema.    Vital Signs:  Patient profile:   70 year old female Height:      66.5 inches Weight:      160.50 pounds BMI:     25.61 O2 Sat:      98 % on Room air Temp:     97.1 degrees F oral Pulse rate:   73 / minute BP sitting:   140 / 80  (left arm) Cuff  size:   regular  Vitals Entered By: Randell Loop CMA (May 04, 2009 11:35 AM)  O2 Sat at Rest %:  98 O2 Flow:  Room air CC: 6 month follow up--needs rx for cloraZepate and actos--both ears are stopped up and drain at night--abd pain at times that hurts most all of the time Is Patient Diabetic? Yes Pain Assessment Patient in pain? yes      Onset of pain  abd pain at times Comments meds updated today   Physical Exam  Additional Exam:  WD, WN, 70 y/o WF in NAD... GENERAL:  Alert & oriented; pleasant & cooperative... HEENT:  [place]/AT, EOM-wnl, PERRLA, EACs- sl red w/ cerumen; NOSE-clear, THROAT-mild redness, no exudate NECK:  Supple w/ adeq ROM; no JVD; normal carotid impulses w/o bruits; no thyromegaly or nodules palpated; no lymphadenopathy. CHEST:  Clear to P & A; without wheezes/ rales/ or rhonchi heard... HEART:  Regular Rhythm; without murmurs/ rubs/ or gallops detected... ABDOMEN: scar of prev surg, abd panniculus, normal bowel sounds; no  organomegaly or masses palpated... EXT: mild dupytren's scarring in both palms, no obvious arthritic changes, no varicose veins/ venous insuffic/ or edema. NEURO:  CN's intact; motor testing normal; sensory testing normal; gait normal & balance OK. DERM:  No lesions noted; no rash etc...    MISC. Report  Procedure date:  05/04/2009  Findings:      Pt will return to our lab one morning this week for FASTINg blood work & we will notify her of the results...     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 continue Rx, diet, exercise. Her updated medication list for this problem includes:    Aspirin 81 Mg Tbec (Aspirin) .Marland Kitchen... Take by mouth once daily    Plavix 75 Mg Tabs (Clopidogrel bisulfate) .Marland Kitchen... Take 1 tablet by mouth once a day    Atenolol 50 Mg Tabs (Atenolol) .Marland Kitchen... Take by mouth once daily    Altace 10 Mg Tabs (Ramipril) .Marland Kitchen... Take by mouth once daily    Hydrochlorothiazide 25 Mg Tabs (Hydrochlorothiazide) .Marland Kitchen... Take by mouth once daily  Problem # 2:  HYPERTENSION (ICD-401.9) Controlled on meds-  continue same + better diet, exercise. Her updated medication list for this problem includes:    Atenolol 50 Mg Tabs (Atenolol) .Marland Kitchen... Take by mouth once daily    Altace 10 Mg Tabs (Ramipril) .Marland Kitchen... Take by mouth once daily    Hydrochlorothiazide 25 Mg Tabs (Hydrochlorothiazide) .Marland Kitchen... Take by mouth once daily  Problem # 3:  HYPERLIPIDEMIA (ICD-272.4) Due for f/u FLP & will return later this week. Her updated medication list for this problem includes:    Simvastatin 40 Mg Tabs (Simvastatin) ..... One tablet by mouth once daily    Niaspan 500 Mg Tbcr (Niacin (antihyperlipidemic)) .Marland Kitchen... Take by mouth once daily  Problem # 4:  DM (ICD-250.00) Due for f/u labs and she is asking to restart the ACTOS 30mg /d... Her updated medication list for this problem includes:    Aspirin 81 Mg Tbec  (Aspirin) .Marland Kitchen... Take by mouth once daily    Altace 10 Mg Tabs (Ramipril) .Marland Kitchen... Take by mouth once daily    Metformin Hcl 1000 Mg Tabs (Metformin hcl) .Marland Kitchen... Take by mouth two times a day    Glimepiride 4 Mg Tabs (Glimepiride) .Marland Kitchen... Take by mouth once daily    Actos 30 Mg Tabs (Pioglitazone hcl) .Marland Kitchen... Take 1 tab by  mouth once daily...  Orders: Prescription Created Electronically [phone])  Problem # 5:  GERD (ICD-530.81) We discussed starting OMEPRAZOLE 40mg  before dinner & due for f/u w/ GI DrDBrodie... Her updated medication list for this problem includes:    Omeprazole 40 Mg Cpdr (Omeprazole) .Marland Kitchen... Take 1 cap by mouth once daily 30 min before dinner...  Orders: Gastroenterology Referral (GI)  Problem # 6:  COLONIC POLYPS (ICD-211.3) Last colon was 12/06 w/ f/u planned 46yrs...  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  Problem # 8:  OTHER MEDICAL PROBLEMS AS NOTED>>>  Complete Medication List: 1)  Aspirin 81 Mg Tbec (Aspirin) .... Take by mouth once daily 2)  Plavix 75 Mg Tabs (Clopidogrel bisulfate) .... Take 1 tablet by mouth once a day 3)  Atenolol 50 Mg Tabs (Atenolol) .... Take by mouth once daily 4)  Altace 10 Mg Tabs (Ramipril) .... Take by mouth once daily 5)  Hydrochlorothiazide 25 Mg Tabs (Hydrochlorothiazide) .... Take by mouth once daily 6)  Metformin Hcl 1000 Mg Tabs (Metformin hcl) .... Take by mouth two times a day 7)  Glimepiride 4 Mg Tabs (Glimepiride) .... Take by mouth once daily 8)  Simvastatin 40 Mg Tabs (Simvastatin) .... One tablet by mouth once daily 9)  Niaspan 500 Mg Tbcr (Niacin (antihyperlipidemic)) .... Take by mouth once daily 10)  Hydrocodone-acetaminophen 5-500 Mg Tabs (Hydrocodone-acetaminophen) .Marland Kitchen.. 1 tab by mouth q6-8h as needed pain - not to exceed 3 per day. 11)  Clorazepate Dipotassium 7.5 Mg Tabs (Clorazepate dipotassium) .... Take one to three  tabs by mouth as needed 12)  Vitamin D 1000 Unit Tabs (Cholecalciferol) .... Take 2 tablets by mouth once daily 13)  Actos 30 Mg Tabs (Pioglitazone hcl) .... Take 1 tab by mouth once daily... 14)  Omeprazole 40 Mg Cpdr (Omeprazole) .... Take 1 cap by mouth once daily 30 min before dinner...  Patient Instructions: 1)  Today we updated your med list- see below.... 2)  We wrote a new perscription for OMEPRAZOLE to take 30 min before the evening meal... this should help the indigestion.Marland KitchenMarland Kitchen 3)  We will arrange for a follow up appt w/ DrDBrodie for you.... 4)  We also wrote a new perscription for the ACTOS for your Diabetes.Marland KitchenMarland Kitchen 5)  Don't forget to return to our lab one morning this week for your FASTING blood work... please call the "phone tree" in a few days for your lab results.Marland KitchenMarland Kitchen 6)  Stay as active as poss, and stay on the low carb, low fat diet.Marland KitchenMarland Kitchen 7)  Call for any questions... 8)  Please schedule a follow-up appointment in 6 months. Prescriptions: OMEPRAZOLE 40 MG CPDR (OMEPRAZOLE) take 1 cap by mouth once daily 30 min before dinner...  #30 x prn   Entered and Authorized by:   Michele Mcalpine MD   Signed by:   Michele Mcalpine MD on 05/04/2009   Method used:   Print then Give to Patient   RxID:   [id number] ACTOS 30 MG TABS (PIOGLITAZONE HCL) take 1 tab by mouth once daily...  #30 x prn   Entered and Authorized by:   Michele Mcalpine MD   Signed by:   Michele Mcalpine MD on 05/04/2009   Method used:   Print then Give to Patient   RxID:   [id number] CLORAZEPATE DIPOTASSIUM 7.5 MG  TABS (CLORAZEPATE DIPOTASSIUM) take one to three tabs by  mouth as needed  #90 x prn   Entered and Authorized by:   Michele Mcalpine MD   Signed by:   Michele Mcalpine MD on 05/04/2009   Method used:   Print then Give to Patient   RxID:   [id number]

## 2010-02-01 NOTE — Medication Information (Signed)
Summary: [facility]ification/[facility]  Clarification/[facility]   Imported By: Lester New Alluwe 08/05/2009 09:06:59  _____________________________________________________________________  External Attachment:    Type:   Image     Comment:   External Document

## 2010-02-01 NOTE — Miscellaneous (Signed)
Summary: Omeprazole Rx  Clinical Lists Changes  Medications: Added new medication of OMEPRAZOLE 40 MG CPDR (OMEPRAZOLE) take 1 by mouth once daily 30 minutes before breakfast - Signed Rx of OMEPRAZOLE 40 MG CPDR (OMEPRAZOLE) take 1 by mouth once daily 30 minutes before breakfast;  #90 x 3;  Signed;  Entered by: Jennye Boroughs RN;  Authorized by: Hart Carwin MD;  Method used: Print then Give to Patient    Prescriptions: OMEPRAZOLE 40 MG CPDR (OMEPRAZOLE) take 1 by mouth once daily 30 minutes before breakfast  #90 x 3   Entered by:   Jennye Boroughs RN   Authorized by:   Hart Carwin MD   Signed by:   Jennye Boroughs RN on 06/02/2009   Method used:   Print then Give to Patient   RxID:   [id number]

## 2010-02-01 NOTE — Assessment & Plan Note (Signed)
Summary: b12 injection/ monthly/sheri  Nurse Visit   Allergies: 1)  ! Advil 2)  Codeine 3)  * Aleve 4)  Demerol  Medication Administration  Injection # 1:    Medication: Vit B12 1000 mcg    Diagnosis: VITAMIN B12 DEFICIENCY (ICD-266.2)    Route: IM    Site: L deltoid    Exp Date: 02/03/2011    Lot #: [id number]    Mfr: American Regent    Patient tolerated injection without complications    Given by: Lowry Ram NCMA (May 20, 2009 11:04 AM)  Orders Added: 1)  Vit B12 1000 mcg [J3420] Pt's next appt scheduled for 06-16-09 at 10AM for monthly B112 Injection.

## 2010-02-01 NOTE — Letter (Signed)
Summary: Diabetic Instructions  [facility]  [address] [place], [place] [address]   Phone: [phone]  Fax: [phone]    [name] 09-10-1940 MRN: [id number]   _x  _   ORAL DIABETIC MEDICATION INSTRUCTIONS  The day before your procedure:   Take your diabetic pill as you do normally  The day of your procedure:   Do not take your diabetic pill    We will check your blood sugar levels during the admission process and again in Recovery before discharging you home  ________________________________________________________________________

## 2010-02-01 NOTE — Letter (Signed)
Summary: Patient Notice-Endo Biopsy Results  [facility]  [address] [place], [place] [address]   Phone: [phone]  Fax: [phone]        June 03, 2009 MRN: [id number]    [facility] [address] [place], [place]  [address]    Dear Ms. Rosalene Billings,  I am pleased to inform you that the biopsies taken during your recent endoscopic examination did not show any evidence of cancer upon pathologic examination.  Additional information/recommendations:  __No further action is needed at this time.  Please follow-up with      your primary care physician for your other healthcare needs.  __ Please call [phone] to schedule a return visit to review      your condition.  _x_ Continue with the treatment plan as outlined on the day of your      exam.  _x_ You should have a repeat endoscopic examination for this problem              in 5_/years.   Please call us if you are having persistent problems or have questions about your condition that have not been fully answered at this time.  Sincerely,  Hart Carwin MD  This letter has been electronically signed by your physician.  Appended Document: Patient Notice-Endo Biopsy Results letter mailed.

## 2010-02-01 NOTE — Progress Notes (Signed)
Summary: rx's  Phone Note Call from Patient Call back at Home Phone [phone]   Caller: Patient Call For: nadel Summary of Call: pt dropped off rx's to [facility]. fax # [phone]. contact # for [facility] is [phone] NOTE: no one in triage at the moment (lunch) so i put this envelope in the refill req box on the 1st desk (as you walk in to triage).  Initial call taken by: Tivis Ringer, CNA,  July 12, 2009 12:39 PM  Follow-up for Phone Call        Rxs were faxed to [facility] to the above number requested. Follow-up by: Vernie Murders,  July 12, 2009 2:40 PM

## 2010-02-01 NOTE — Letter (Signed)
Summary: [facility] Instructions  [facility]  [address] [place], [place] [address]   Phone: [phone]  Fax: [phone]       [name]    1940/07/14    MRN: [id number]       Procedure Day /Date: 06/02/09 Wednesday     Arrival Time: 9:00 am     Procedure Time: 10:00 am     Location of Procedure:                    _ x_  [facility] (4th Floor)  PREPARATION FOR COLONOSCOPY WITH MIRALAX  Starting 5 days prior to your procedure (05/28/09) do not eat nuts, seeds, popcorn, corn, beans, peas,  salads, or any raw vegetables.  Do not take any fiber supplements (e.g. Metamucil, Citrucel, and Benefiber). ____________________________________________________________________________________________________   THE DAY BEFORE YOUR PROCEDURE         DATE: 06/01/09 DAY: Tuesday  1   Drink clear liquids the entire day-NO SOLID FOOD  2   Do not drink anything colored red or purple.  Avoid juices with pulp.  No orange juice.  3   Drink at least 64 oz. (8 glasses) of fluid/clear liquids during the day to prevent dehydration and help the prep work efficiently.  CLEAR LIQUIDS INCLUDE: Water Jello Ice Popsicles Tea (sugar ok, no milk/cream) Powdered fruit flavored drinks Coffee (sugar ok, no milk/cream) Gatorade Juice: apple, white grape, white cranberry  Lemonade Clear bullion, consomm, broth Carbonated beverages (any kind) Strained chicken noodle soup Hard Candy  4   Mix the entire bottle of Miralax with 64 oz. of Gatorade/Powerade in the morning and put in the refrigerator to chill.  5   At 3:00 pm take 2 Dulcolax/Bisacodyl tablets.  6   At 4:30 pm take one Reglan/Metoclopramide tablet.  7  Starting at 5:00 pm drink one 8 oz glass of the Miralax mixture every 15-20 minutes until you have finished drinking the entire 64 oz.  You should finish drinking prep around 7:30 or 8:00 pm.  8   If you are nauseated, you may take the 2nd Reglan/Metoclopramide  tablet at 6:30 pm.        9    At 8:00 pm take 2 more DULCOLAX/Bisacodyl tablets.     THE DAY OF YOUR PROCEDURE      DATE:  06/02/09 DAY: Wednesday  You may drink clear liquids until 8:00 am  (2 HOURS BEFORE PROCEDURE).   MEDICATION INSTRUCTIONS  Unless otherwise instructed, you should take regular prescription medications with a small sip of water as early as possible the morning of your procedure.  Continue taking your Plavix for your procedure per Dr. Lina Sar.        OTHER INSTRUCTIONS  You will need a responsible adult at least 71 years of age to accompany you and drive you home.   This person must remain in the waiting room during your procedure.  Wear loose fitting clothing that is easily removed.  Leave jewelry and other valuables at home.  However, you may wish to bring a book to read or an iPod/MP3 player to listen to music as you wait for your procedure to start.  Remove all body piercing jewelry and leave at home.  Total time from sign-in until discharge is approximately 2-3 hours.  You should go home directly after your procedure and rest.  You can resume normal activities the day after your procedure.  The day of your procedure  you should not:   Drive   Make legal decisions   Operate machinery   Drink alcohol   Return to work  You will receive specific instructions about eating, activities and medications before you leave.   The above instructions have been reviewed and explained to me by   Lamona Curl CMA Duncan Dull)  May 19, 2009 8:31 AM     I fully understand and can verbalize these instructions _____________________________ Date 05/19/09

## 2010-02-01 NOTE — Assessment & Plan Note (Signed)
Summary: MONTHLY B12 SHOT...LSW.  Nurse Visit   Allergies: 1)  ! Advil 2)  Codeine 3)  * Aleve 4)  Demerol  Medication Administration  Injection # 1:    Medication: Vit B12 1000 mcg    Diagnosis: VITAMIN B12 DEFICIENCY (ICD-266.2)    Route: IM    Site: L deltoid    Exp Date: 04/03/2011    Lot #: [id number]    Mfr: [employer]    Patient tolerated injection without complications    Given by: Harlow Mares CMA (AAMA) (August 16, 2009 9:49 AM)  Orders Added: 1)  Vit B12 1000 mcg [J3420]

## 2010-02-01 NOTE — Procedures (Signed)
Summary: Upper Endoscopy  Patient: [name] Note: All result statuses are Final unless otherwise noted.  Tests: (1) Upper Endoscopy (EGD)   EGD Upper Endoscopy       DONE     [facility]     [address]     [place], [place]  [address]           ENDOSCOPY PROCEDURE REPORT           PATIENT:  [name], [name]  MR#:  [id number]     BIRTHDATE:  1940-07-25, 68 yrs. old  GENDER:  female           ENDOSCOPIST:  Hedwig Morton. Juanda Chance, MD     Referred by:  Alroy Dust, M.D.           PROCEDURE DATE:  06/02/2009     PROCEDURE:  EGD with biopsy     ASA CLASS:  Class III     INDICATIONS:  abdominal pain hematochezia, hx of jejunal     adenocarcinoma resected in 2000, she is on Plavix,     anemia H/H 11.5/33.3           MEDICATIONS:   Versed 7 mg, Fentanyl 75 mcg     TOPICAL ANESTHETIC:  Exactacain Spray           DESCRIPTION OF PROCEDURE:   After the risks benefits and     alternatives of the procedure were thoroughly explained, informed     consent was obtained.  The LB GIF-H180 [id number] endoscope was     introduced through the mouth and advanced to the second portion of     the duodenum, without limitations.  The instrument was slowly     withdrawn as the mucosa was fully examined.     <<PROCEDUREIMAGES>>           Severe gastritis was found in the antrum. bleeding mucosa,     multiple erosions, edema, With standard forceps, a biopsy was     obtained and sent to pathology (see image2, image5, and image6).     Retained food was present (see image8). small amount of food     particles in the stomach  Otherwise the examination was normal     (see image9, image7, image4, image3, and image1).    Retroflexed     views revealed no abnormalities.    The scope was then withdrawn     from the patient and the procedure completed.           COMPLICATIONS:  None           ENDOSCOPIC IMPRESSION:     1) Severe gastritis in the antrum     2) Food, retained     3) Otherwise normal  examination     gastritis in the antrum likely causing low grade GI blood loss,     Plavix is a contributing factor     RECOMMENDATIONS:     1) Await pathology results     Omeprazole 40 mg po qd     continue Plavix     follow H/H,give Iron supplements           REPEAT EXAM:  In 5 year(s) for.           ______________________________     Hedwig Morton. Juanda Chance, MD           CC:           n.     eSIGNED:  Hedwig Morton. Sylvanus Telford at 06/02/2009 10:50 AM           Page 2 of 3   [place], [place], [id number]  Note: An exclamation mark (!) indicates a result that was not dispersed into the flowsheet. Document Creation Date: 06/02/2009 10:51 AM _______________________________________________________________________  (1) Order result status: Final Collection or observation date-time: 06/02/2009 10:27 Requested date-time:  Receipt date-time:  Reported date-time:  Referring Physician:   Ordering Physician: Lina Sar [phone]) Specimen Source:  Source: Launa Grill Order Number: [id number] Lab site:   Appended Document: Upper Endoscopy recall EGD 5 years  Appended Document: Upper Endoscopy     Procedures Next Due Date:    EGD: 06/2014

## 2010-02-01 NOTE — Procedures (Signed)
Summary: Colonoscopy  Patient: [name] Note: All result statuses are Final unless otherwise noted.  Tests: (1) Colonoscopy (COL)   COL Colonoscopy           DONE     [facility]     [address]     [place], [place]  [address]           COLONOSCOPY PROCEDURE REPORT           PATIENT:  [name], [name]  MR#:  [id number]     BIRTHDATE:  1940-07-08, 68 yrs. old  GENDER:  female     ENDOSCOPIST:  Hedwig Morton. Juanda Chance, MD     REF. BY:  Alroy Dust, M.D.     PROCEDURE DATE:  06/02/2009     PROCEDURE:  Colonoscopy [id number]     ASA CLASS:  Class III     INDICATIONS:  hematochezia, anemia hx of adenoca of the jejunum     resected in 2000     last colon 2006,polyp snared     MEDICATIONS:   Versed 3 mg, Fentanyl 25 mcg           DESCRIPTION OF PROCEDURE:   After the risks benefits and     alternatives of the procedure were thoroughly explained, informed     consent was obtained.  Digital rectal exam was performed and     revealed no rectal masses.   The LB CF-H180AL [id number] endoscope     was introduced through the anus and advanced to the cecum, which     was identified by both the appendix and ileocecal valve, without     limitations.  The quality of the prep was good, using MiraLax.     The instrument was then slowly withdrawn as the colon was fully     examined.     <<PROCEDUREIMAGES>>           FINDINGS:  Two polyps were found in the cecum. 2 and 3 mm sessile     polyps removed from cecum The polyps were removed using cold     biopsy forceps (see image3 and image4).  Internal hemorrhoids were     found (see image5).  This was otherwise a normal examination of     the colon (see image2 and image1).   Retroflexed views in the     rectum revealed no abnormalities.    The scope was then withdrawn     from the patient and the procedure completed.           COMPLICATIONS:  None     ENDOSCOPIC IMPRESSION:     1) Two polyps in the cecum     2) Internal hemorrhoids     3)  Otherwise normal examination     RECOMMENDATIONS:     1) Await pathology results     continue Plavix     continue Iron supplements, follow H/H with Dr Kriste Basque     REPEAT EXAM:  In 5 year(s) for.           ______________________________     Hedwig Morton. Juanda Chance, MD           CC:           n.     eSIGNED:   Hedwig Morton. Brodie at 06/02/2009 10:56 AM           [place], [place], [place] [id number]  Note: An exclamation mark (!) indicates a result that was not dispersed into the  flowsheet. Document Creation Date: 06/02/2009 10:57 AM _______________________________________________________________________  (1) Order result status: Final Collection or observation date-time: 06/02/2009 10:39 Requested date-time:  Receipt date-time:  Reported date-time:  Referring Physician:   Ordering Physician: Lina Sar [phone]) Specimen Source:  Source: Launa Grill Order Number: [id number] Lab site:   Appended Document: Colonoscopy recall colon 5 years  Appended Document: Colonoscopy     Procedures Next Due Date:    Colonoscopy: 06/2014

## 2010-02-01 NOTE — Assessment & Plan Note (Signed)
Summary: Monthly B12 Injection  Nurse Visit   Allergies: 1)  ! Advil 2)  Codeine 3)  * Aleve 4)  Demerol  Medication Administration  Injection # 1:    Medication: Vit B12 1000 mcg    Diagnosis: VITAMIN B12 DEFICIENCY (ICD-266.2)    Route: IM    Site: L deltoid    Exp Date: 02/2011    Lot #: [id number]    Mfr: [employer]    Comments: pt to schedule next monthly b12 at front desk    Patient tolerated injection without complications    Given by: Chales Abrahams CMA Duncan Dull) (June 16, 2009 10:23 AM)  Orders Added: 1)  Vit B12 1000 mcg [J3420]

## 2010-02-01 NOTE — Progress Notes (Signed)
Summary: rx refills  Phone Note Call from Patient Call back at Home Phone [phone]   Caller: Patient Call For: nadel Summary of Call: pt dropped off her med list. wants rx's for 3 months supply for all her rx meds EXCEPT omeprazole (which she just filled -thru dr Juanda Chance). please mail rx's to her home address.  Initial call taken by: Tivis Ringer, CNA,  June 16, 2009 10:38 AM  Follow-up for Phone Call        rx have been printed out and placed in the mail to pts home address per her request. Randell Loop CMA  June 16, 2009 3:38 PM     Prescriptions: ACTOS 30 MG TABS (PIOGLITAZONE HCL) take 1 tab by mouth once daily...  #90 x 3   Entered by:   Randell Loop CMA   Authorized by:   Michele Mcalpine MD   Signed by:   Randell Loop CMA on 06/16/2009   Method used:   Print then Give to Patient   RxID:   [id number] NIASPAN 500 MG  TBCR (NIACIN (ANTIHYPERLIPIDEMIC)) take by mouth once daily  #90 x 3   Entered by:   Randell Loop CMA   Authorized by:   Michele Mcalpine MD   Signed by:   Randell Loop CMA on 06/16/2009   Method used:   Print then Give to Patient   RxID:   [id number] SIMVASTATIN 40 MG TABS (SIMVASTATIN) one tablet by mouth once daily  #90 x 3   Entered by:   Randell Loop CMA   Authorized by:   Michele Mcalpine MD   Signed by:   Randell Loop CMA on 06/16/2009   Method used:   Print then Give to Patient   RxID:   [id number] METFORMIN HCL 1000 MG  TABS (METFORMIN HCL) take by mouth two times a day  #180 x 3   Entered by:   Randell Loop CMA   Authorized by:   Michele Mcalpine MD   Signed by:   Randell Loop CMA on 06/16/2009   Method used:   Print then Give to Patient   RxID:   [id number] GLIMEPIRIDE 4 MG  TABS (GLIMEPIRIDE) take by mouth once daily  #90 x 3   Entered by:   Randell Loop CMA   Authorized by:   Michele Mcalpine MD   Signed by:   Randell Loop CMA on 06/16/2009   Method used:   Print then Give to Patient   RxID:    [id number] HYDROCHLOROTHIAZIDE 25 MG  TABS (HYDROCHLOROTHIAZIDE) take by mouth once daily  #90 x 3   Entered by:   Randell Loop CMA   Authorized by:   Michele Mcalpine MD   Signed by:   Randell Loop CMA on 06/16/2009   Method used:   Print then Give to Patient   RxID:   [id number] ALTACE 10 MG  TABS (RAMIPRIL) take by mouth once daily  #90 x 3   Entered by:   Randell Loop CMA   Authorized by:   Michele Mcalpine MD   Signed by:   Randell Loop CMA on 06/16/2009   Method used:   Print then Give to Patient   RxID:   [id number] ATENOLOL 50 MG  TABS (ATENOLOL) take by mouth once daily  #90 x 3   Entered by:   Randell Loop CMA   Authorized by:   Michele Mcalpine MD   Signed by:   Randell Loop CMA  on 06/16/2009   Method used:   Print then Give to Patient   RxID:   [id number] PLAVIX 75 MG  TABS (CLOPIDOGREL BISULFATE) Take 1 tablet by mouth once a day  #90 x 3   Entered by:   Randell Loop CMA   Authorized by:   Michele Mcalpine MD   Signed by:   Randell Loop CMA on 06/16/2009   Method used:   Print then Give to Patient   RxID:   [id number]

## 2010-02-01 NOTE — Assessment & Plan Note (Signed)
Summary: MONTHLY B12 SHOT/JMS  Nurse Visit   Allergies: 1)  ! Advil 2)  Codeine 3)  * Aleve 4)  Demerol  Immunizations Administered:  Influenza Vaccine # 1:    Vaccine Type: Fluvirin    Site: right deltoid    Mfr: [employer]    Dose: 0.5 ml    Route: IM    Given by: Christie Nottingham CMA (AAMA)    Exp. Date: 06/03/2010    Lot #: [id number]    VIS given: 07/27/09 version given October 15, 2009.  Flu Vaccine Consent Questions:    Do you have a history of severe allergic reactions to this vaccine? no    Any prior history of allergic reactions to egg and/or gelatin? no    Do you have a sensitivity to the preservative Thimersol? no    Do you have a past history of Guillan-Barre Syndrome? no    Do you currently have an acute febrile illness? no    Have you ever had a severe reaction to latex? no    Vaccine information given and explained to patient? yes    Are you currently pregnant? no  Medication Administration  Injection # 1:    Medication: Vit B12 1000 mcg    Diagnosis: VITAMIN B12 DEFICIENCY (ICD-266.2)    Route: IM    Site: R deltoid    Exp Date: 07/03/2011    Lot #: [id number]    Mfr: [employer]    Patient tolerated injection without complications    Given by: Christie Nottingham CMA Duncan Dull) (October 15, 2009 11:42 AM)  Orders Added: 1)  Vit B12 1000 mcg [J3420]  Pt will come in for a Vitamin b-12 level that day of her next monthly b-12 injection.

## 2010-02-01 NOTE — Assessment & Plan Note (Signed)
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 She is status post laparoscopic repair of a large ventral hernia by Dr. Ezzard Standing in September 2010. She is on Plavix after a coronary artery bypass graft in 1997. Patient has had recent renal insufficiency with a creatinine of 1.5 and a BUN 31. She has also been mildly anemic with a hemoglobin of 11.5 and a hematocrit of 33.3. Her last CEA level was 1.2 in August 2010. She comes today with a 2 month history of periumbilical abdominal pain which is not related to meals but is worse when she works in the yard, when she turns at night or when she bends over. It is an achy pain rather than a crampy abdominal pain. Her bowel habits have been regular. Her weight has been stable. She has been having increasing acid reflux at night as well.   GI Review of Systems    Reports abdominal pain, acid reflux, and  bloating.     Location of  Abdominal pain: generalized.    Denies belching, chest pain, dysphagia with liquids, dysphagia with solids, heartburn, loss of appetite, nausea, vomiting, vomiting blood, weight loss, and  weight gain.      Reports rectal bleeding.     Denies anal fissure, black tarry stools, change in bowel habit, constipation, diarrhea, diverticulosis, fecal incontinence, heme positive stool, hemorrhoids, irritable bowel syndrome, jaundice, light color stool, liver problems, and  rectal pain.    Current Medications (verified): 1)  Aspirin 81 Mg  Tbec (Aspirin) .... Take By Mouth Once Daily 2)  Plavix 75 Mg  Tabs (Clopidogrel  Bisulfate) .... Take 1 Tablet By Mouth Once A Day 3)  Atenolol 50 Mg  Tabs (Atenolol) .... Take By Mouth Once Daily 4)  Altace 10 Mg  Tabs (Ramipril) .... Take By Mouth Once Daily 5)  Hydrochlorothiazide 25 Mg  Tabs (Hydrochlorothiazide) .... Take By Mouth Once Daily 6)  Metformin Hcl 1000 Mg  Tabs (Metformin Hcl) .... Take By Mouth Two Times A Day 7)  Glimepiride 4 Mg  Tabs (Glimepiride) .... Take By Mouth Once Daily 8)  Simvastatin 40 Mg Tabs (Simvastatin) .... One Tablet By Mouth Once Daily 9)  Niaspan 500 Mg  Tbcr (Niacin (Antihyperlipidemic)) .... Take By Mouth Once Daily 10)  Hydrocodone-Acetaminophen 5-500 Mg  Tabs (Hydrocodone-Acetaminophen) .Marland Kitchen.. 1 Tab By Mouth Q6-8h As Needed Pain - Not To Exceed 3 Per Day. 11)  Clorazepate Dipotassium 7.5 Mg  Tabs (Clorazepate Dipotassium) .... Take One To Three Tabs By Mouth As Needed 12)  Vitamin D 1000 Unit Tabs (Cholecalciferol) .... Take 2 Tablets By Mouth Once Daily 13)  Actos 30 Mg Tabs (Pioglitazone Hcl) .... Take 1 Tab By Mouth Once Daily... 14)  Omeprazole 40 Mg Cpdr (Omeprazole) .... Take 1 Cap By Mouth Once Daily 30 Min Before Dinner... 15)  Multivitamins  Tabs (Multiple Vitamin) .... Once Daily  Allergies (verified): 1)  ! Advil 2)  Codeine 3)  * Aleve 4)  Demerol  Past History:  Past Medical History: Reviewed history from 05/04/2009 and no changes required. 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 mother died age 78 from heart attack father died age 68 from massive heart attack 1 sibling 1 brother died age 46 from emphysema Family History Rheumatoid Arthritis No FH of Colon Cancer:  Social History: Reviewed history from 08/27/2008 and no changes required. retired widowed 2 children never smoked exercises 3 x a week drinks caffeinated sodas  Review of Systems       The patient complains of allergy/sinus, back pain, fatigue, headaches-new, muscle pains/cramps, night sweats, and swelling of feet/legs.  The patient denies anemia, anxiety-new, arthritis/joint pain, blood in urine, breast changes/lumps, change in vision, confusion, cough, coughing up blood, depression-new, fainting, fever, hearing problems, heart murmur, heart rhythm changes, itching, menstrual pain, nosebleeds, pregnancy symptoms, shortness of breath, skin rash, sleeping problems, sore throat, swollen lymph glands, thirst - excessive , urination - excessive , urination changes/pain, urine leakage, vision changes, and voice change.         Pertinent positive and negative review of systems were noted in the above HPI. All other ROS was otherwise negative.   Vital Signs:  Patient profile:   70 year old female Height:      66.5 inches Weight:      164 pounds BMI:     26.17 Pulse rate:   68 / minute Pulse rhythm:   regular BP sitting:   124 / 72  (left arm) Cuff size:   regular  Vitals Entered By: June McMurray CMA Duncan Dull) (May 19, 2009 8:11 AM)  Physical Exam  General:  Well developed, well nourished, no acute distress. Mouth:  No deformity or lesions, dentition normal. Neck:  Supple; no masses or thyromegaly. Chest Wall:  well-healed sternotomy scar. Lungs:  Clear throughout to auscultation. Heart:   Regular rate and rhythm; no murmurs, rubs,  or bruits. Abdomen:  well-healed surgical scars mild tenderness in the periumbilical area, normal active bowel sounds,no palpable mass or distention. Rectal:  solid Hemoccult negative stool. Extremities:  No clubbing, cyanosis, edema or deformities noted. Skin:  Dupuytren contractures. Psych:  Alert and cooperative. Normal mood and affect.   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 Her last upper endoscopy and colonoscopy was in 2006. Patient is due for repeat endoscopic exams. Her abdominal pain is more suggestive of adhesions then of an intestinal obstruction. She will take Prilosec 40 mg daily as per Dr. Kriste Basque. We are checking her CEA level and iron levels today. Orders: TLB-CEA (Carcinoembryonic Antigen) (82378-CEA) TLB-B12, Serum-Total ONLY (82607-B12) TLB-IBC Pnl (Iron/FE;Transferrin) (83550-IBC) Colon/Endo (Colon/Endo)  Problem # 2:  GASTRITIS, HX OF (ICD-V12.79) Patient's last upper endoscopy was in 2006 and a small bowel capsule endoscopy showed AVMs. We will assess patient for gastroesophageal reflux, gastritis or AVMs.  Problem # 3:  COLONIC POLYPS (ICD-211.3) Assessment: Comment Only We will plan for a repeat colonoscopy at this time. Orders: Colon/Endo (Colon/Endo)  Patient  Instructions: 1)  upper endoscopy. 2)  Colonoscopy. 3)  Continue Plavix prior to the Endo  and colonoscopy. 4)  Prilosec 40 mg daily. 5)  CEA, Iron studies and B12 to be drawn today. 6)  If pain continues, depending on the results of the endoscopic studies, consider CT scan of the abdomen. 7)  The medication list was reviewed and reconciled.  All changed / newly prescribed medications were explained.  A complete medication list was provided to the patient / caregiver. Prescriptions: DULCOLAX 5 MG  TBEC (BISACODYL)  Day before procedure take 2 at 3pm and 2 at 8pm.  #4 x 0   Entered by:   Lamona Curl CMA (AAMA)   Authorized by:   Hart Carwin MD   Signed by:   Lamona Curl CMA (AAMA) on 05/19/2009   Method used:   Electronically to        [facility] [address] [id number]* (retail)       [address] [place], [place]  [address]       Ph: [phone]       Fax: [phone]   RxID:   [id number] REGLAN 10 MG  TABS (METOCLOPRAMIDE HCL) As per prep instructions.  #2 x 0   Entered by:   Lamona Curl CMA (AAMA)   Authorized by:   Hart Carwin MD   Signed by:   Lamona Curl CMA (AAMA) on 05/19/2009   Method used:   Electronically to        [id number]* (retail)       [address] [place], [place]  [address]       Ph: [phone]       Fax: [phone]   RxID:   [id number] MIRALAX   POWD (POLYETHYLENE GLYCOL 3350) As per prep  instructions.  #255gm x 0   Entered by:   Lamona Curl CMA (AAMA)   Authorized by:   Hart Carwin MD   Signed by:   Lamona Curl CMA (AAMA) on 05/19/2009   Method used:   Electronically to        [id number]* (retail)       [address] [place], [place]  [address]       Ph: [phone]       Fax: [phone]   RxID:   [id number]

## 2010-02-01 NOTE — Assessment & Plan Note (Signed)
Summary: MONTHLY B12 W/LABS/JMS  Nurse Visit   Allergies: 1)  ! Advil 2)  Codeine 3)  * Aleve 4)  Demerol  Medication Administration  Injection # 1:    Medication: Vit B12 1000 mcg    Diagnosis: VITAMIN B12 DEFICIENCY (ICD-266.2)    Route: IM    Site: L deltoid    Exp Date: 08/03/2011    Lot #: [id number]    Mfr: [employer]    Comments: Patient wants to wait for lab results before scheduling another B12 injection. Called patient per Karen Kitchens to explain that we have to schedule next injection, that can be cancelled if results are positive and injection is not needed.    Patient tolerated injection without complications    Given by: June McMurray CMA Duncan Dull) (November 16, 2009 1:54 PM)  Orders Added: 1)  Vit B12 1000 mcg [J3420]   Medication Administration  Injection # 1:    Medication: Vit B12 1000 mcg    Diagnosis: VITAMIN B12 DEFICIENCY (ICD-266.2)    Route: IM    Site: L deltoid    Exp Date: 08/03/2011    Lot #: [id number]    Mfr: [employer]    Comments: Patient wants to wait for lab results before scheduling another B12 injection. Called patient per Karen Kitchens to explain that we have to schedule next injection, that can be cancelled if results are positive and injection is not needed.    Patient tolerated injection without complications    Given by: June McMurray CMA Duncan Dull) (November 16, 2009 1:54 PM)  Orders Added: 1)  Vit B12 1000 mcg [J3420]

## 2010-02-03 NOTE — Assessment & Plan Note (Signed)
Summary: rov 6 months///kp   Primary Care Provider:  Alroy Dust, MD  CC:  8 month ROV & review of mult medical problems....  History of Present Illness: 70 y/o WF here for a follow up visit... she has mult med problems as noted below...   ~  November 05, 2008:  she was last seen in office 6/09 & hosp x2 recently- 8/13-16 and 9/21-26/10 w/ ventral hernia containing incarcerated colon... had lap ventral hernia repair by [facility] on 09/22/08... she has improved but still weak, wt down 5#, feeling sl depressed & asking for Lexapro Rx... she's already had the 2010 flu vaccine...    ~  May 04, 2009:  35mo f/u & feeling better overall but still has some GI issues and needs ROV w/ DrDBrodie... hasn't been taking Prilosec & will write for OMEP40 daily before dinner... also c/o ear discomfort & exam shows 4+wax w/ prev disimpaction by [facility] & she will f/u w/ him as well... she denies CP, BP controlled on meds, due for f/u FLP & DM labs- she wants to start back on Actos she says...   ~  December 31, 2009:  54mo ROV- c/o sinus symptoms & we discussed OTC antihist, saline, Mucinex, & Flonase Qhs... she had f/u DrWeintraub 11/11 (HBP, ASHD, Hyperlipid, DM) w/ labs done> see below... she also had f/u DrDBrodie 6/11 w/ EGD showing antral gastritis (neg HPylori) & retained food- rx'd OMEP40 but she has since stopped this med; & Colonoscopy w/ 2 polyps & int hems- path= tub adenomas & f/u planned 3yrs... Fe normal at 93, B12 was borderline low at 250 (could be Metformin related) & she started B12 shots thru GI dept...  BP controlled on meds;  she will f/u FLP later;  recent A1c= 7.4 on Metform & Glimep- she refuses Januvia & Actos;  requesting refill Vicodin today.    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 denies CP, palpit, syncope, edema, etc.  ~  Myoview 12/08 showed perfusion defect  w/ infarct/ scar & perinfarct ischemia...  ~  subseq Cath 12/08 showed progressive CIRC dis & patent grafts- med Rx...  ~  seen by [name]/ [facility] 9/10 w/ Myoview & 2DEcho- cleared for ventral hernia surg.  ~  she continues regular f/u w/ [name], [facility]... his notes are reviewed.  HYPERTENSION (ICD-401.9) - on ATENOLOL 50mg /d, ALTACE 10mg /d, HCTZ 25mg /d... BP= 114/64, feeling better, taking meds regularly... denies HA, visual changes, CP, palipit, syncope, dyspnea, edema, etc...   VENOUS INSUFFICIENCY (ICD-459.81) - on low sodium diet, elevates legs, wear support hose when able...  HYPERLIPIDEMIA (ICD-272.4) - on SIMVASTATIN 40mg /d & NIASPAN 500mg /d... tolerates meds fair & takes her ASA prior to the Niacin Qhs... prev on Lip20 but stopped due to $$...  ~  FLP 6/08 on Lip20 showed TChol 118, TG 155, HDL 41, LDL 46  ~  FLP 12/08 on Lip20 showed TChol 129, TG 201, HDL 37, LDL 56  ~  FLP 6/09 on Lip20 showed TChol 113, TG 162, HDL 34, LDL 47  ~  FLP 11/10 on Simva40+Niasp500 showed TChol 183, TG 347, HDL 49, LDL 72  ~  FLP 5/11 on Simva40+Niasp500 showed TChol 147, TG 307, HDL 40, LDL 49... rec better low fat diet.  DM (ICD-250.00) - on METFORMIN 1000mg Bid, & AMARYL 4mg /d... she refuses Januvia ($$), and Actos (worried about bladder cancer).  ~  labs 6/08  showed BS 110, HgA1C 6.6  ~  labs 12/08 showed BS= 170, HgA1c= 7.2  ~  labs 6/09 showed BS= 210, A1c= 8.0.Marland Kitchen. rec> add Actos 15mg /d but pt never did due to $$.  ~  labs 11/10 showed BS= 189, A1c= 7.6  ~  she refuses Januvia- "it made my sugar go higher"  ~  5/11> she is asking to restart ACTOS 30mg /d because home BS= 150-200 range.  ~  labs 5/11 showed BS= 162, A1c= 7.4... later refused Actos due to risk of bladder cancer.  ~  labs 11/11 by [facility] showed BS= 117, A1c= 7.4.Marland KitchenMarland Kitchen rec to continue Metform & Glimep.  GERD (ICD-530.81) - prev on Prilosec OTC Prn- rec ch to OMEPRAZOLE 40mg /d before dinner...  ~  last EGD 12/06 by DrDBrodie showed mild antral  gastritis, RUT neg, ?from ASA... Rx'd Pepcid 40mg /d.  ~  5/11:  c/o indigestion in eve> rec Omep40 before dinner & antireflux regimen... f/u w/ GI.  ~  EGD 6/11 by DrDBrodie w/ antral gastritis & retained food, neg HPylori- Rx'd PPI.  COLONIC POLYPS (ICD-211.3)  ~  colonoscopy 12/06 by DrDBrodie showed 8mm polyp in sigmoid area= ?path not avail...  ~  colonoscopy 6/11 showed 2 polyps & int hems, bx= tub adenomas & f/u planned 61yrs.  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 post op oncology DrShinn w/ adjuvant chemotherapy- 5-FU/ Leucovorin x 26weeks finished 12/00...  ~  she has been followed by DrDBrodie since DrShinn left [place]...  ~  small bowel capsule endoscopy 1/07 was neg- no recurrent tumor seen, 2 tiny AVM's noted...  ~  CT Abd/ Pelvis 8/10 w/ large ventral hernia containing transverse colon & mesentery- no obstruction, ?hep steatosis, lumbar DJD.  ~  UGI & Sm Bowel Series 9/10 showed no HH or reflux seem, ventral hernia w/ transverse colon, no obstruction or mass in sm bowel. HERNIA, VENTRAL (ICD-553.20) ** see 9/10 Hosp>> lap ventral hernia repairw/ reduction incarcerated colon...  DEGENERATIVE JOINT DISEASE (ICD-715.90) - she is s/p left knee arthroscopy 05/29/1994...  ~  labs 5/11 showed Vit D level = 43  HEADACHE (ICD-784.0) - hx mixed HA's in the past...  ANXIETY & DEPRESSION (ICD-311) - husb passed away in May 29, 2002... she has CHLORAZEPATE 7.5mg  as needed...  ~  11/10: pt requesting LEXAPRO 10mg /d... in f/u she notes improvement & decided to stop Rx.  SHINGLES, HX OF (ICD-V13.8)  ANEMIA, HX OF (ICD-V12.3) & Borderline VITAMIN B12 DEFICIENCY (ICD-266.2)  ~  labs in hosp 9/10 showed Hg down to 9.7 at disch...  ~  labs 11/10 showed  Hg= 11.8  ~  labs 5/11 showed Hg= 11.5, MCV= 93, Fe= 93 (24%sat), B12= 250  ~  labs 11/11 by [facility] showed Hg= 12.2, B12= 544   Preventive Screening-Counseling & Management  Alcohol-Tobacco     Smoking Status: never  Allergies: 1)  ! Advil 2)  Codeine 3)  * Aleve 4)  Demerol  Comments:  Nurse/Medical Assistant: The patient's medications and allergies were reviewed with the patient and were updated in the Medication and Allergy Lists.  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 mother died age 76 from heart attack father died age 12 from massive heart attack 1 sibling 1 brother died age 62 from emphysema Family History Rheumatoid Arthritis No FH of Colon Cancer:  Social History: Reviewed history from 08/27/2008 and no changes required. retired widowed 2 children never smoked exercises 3 x a week drinks caffeinated sodas  Review of Systems      See HPI       The patient complains of depression.  The patient denies anorexia, fever, weight loss, weight gain, vision loss, decreased hearing, hoarseness, chest pain, syncope, dyspnea on exertion, peripheral edema, prolonged cough, headaches,  hemoptysis, abdominal pain, melena, hematochezia, severe indigestion/heartburn, hematuria, incontinence, muscle weakness, suspicious skin lesions, transient blindness, difficulty walking, unusual weight change, abnormal bleeding, enlarged lymph nodes, and angioedema.    Vital Signs:  Patient profile:   70 year old female Height:      66.5 inches Weight:      173.38 pounds O2 Sat:      100 % on Room air Temp:     96.9 degrees F oral Pulse rate:   60 / minute BP sitting:   114 / 64  (right arm) Cuff size:   regular  Vitals Entered By: Randell Loop CMA (December 31, 2009 11:28 AM)  O2 Sat at Rest %:  100 O2 Flow:  Room air CC: 8 month ROV & review of mult medical problems... Is Patient Diabetic? Yes Pain Assessment Patient in pain? yes      Onset of pain  sinus pressure and pain Comments meds updated today with pt   Physical Exam  Additional Exam:  WD, WN, 70 y/o WF in NAD... GENERAL:  Alert & oriented; pleasant & cooperative... HEENT:  [address]/AT, EOM-wnl, PERRLA, EACs- sl red w/ cerumen; NOSE-clear, THROAT-mild redness, no exudate NECK:  Supple w/ adeq ROM; no JVD; normal carotid impulses w/o bruits; no thyromegaly or nodules palpated; no lymphadenopathy. CHEST:  Clear to P & A; without wheezes/ rales/ or rhonchi heard... HEART:  Regular Rhythm; without murmurs/ rubs/ or gallops detected... ABDOMEN: scar of prev surg, abd panniculus, normal bowel sounds; no organomegaly or masses palpated... EXT: mild dupytren's scarring in both palms, no obvious arthritic changes, no varicose veins/ venous insuffic/ or edema. NEURO:  CN's intact; motor testing normal; sensory testing normal; gait normal & balance OK. DERM:  No lesions noted; no rash etc...    MISC. Report  Procedure date:  12/31/2009  Findings:      DATA REVIEWED:  ~  EMR note & EGD, Colon, Labs per DrDBrodie from 5/11 & 6/11...  ~  Office visit & labs from DrWeintraub [facility] 11/11...   Impression &  Recommendations:  Problem # 1:  HYPERTENSION (ICD-401.9) BP controlled>  continue meds. Her updated medication list for this problem includes:    Atenolol 50 Mg Tabs (Atenolol) .Marland Kitchen... Take by mouth once daily    Altace 10 Mg Tabs (Ramipril) .Marland Kitchen... Take by mouth once daily    Hydrochlorothiazide 25 Mg Tabs (Hydrochlorothiazide) .Marland Kitchen... Take by mouth once daily  Problem # 2:  ARTERIOSCLEROTIC HEART DISEASE (ICD-414.00) Followed  by [name]>  stable, continue his Rx... Her updated medication list for this problem includes:    Aspirin 81 Mg Tbec (Aspirin) .Marland Kitchen... Take by mouth once daily    Plavix 75 Mg Tabs (Clopidogrel bisulfate) .Marland Kitchen... Take 1 tablet by mouth once a day    Atenolol 50 Mg Tabs (Atenolol) .Marland Kitchen... Take by mouth once daily    Altace 10 Mg Tabs (Ramipril) .Marland Kitchen... Take by mouth once daily    Hydrochlorothiazide 25 Mg Tabs (Hydrochlorothiazide) .Marland Kitchen... Take by mouth once daily  Problem # 3:  HYPERLIPIDEMIA (ICD-272.4) FLP stable on Simva40... not sure the Niaspan is adding anything & she will discuss discontinuing this med w/ [name]. Her updated medication list for this problem includes:    Simvastatin 40 Mg Tabs (Simvastatin) ..... One tablet by mouth once daily    Niaspan 500 Mg Tbcr (Niacin (antihyperlipidemic)) .Marland Kitchen... Take by mouth once daily  Problem # 4:  DM (ICD-250.00) She wouldn't take the Actos after all... stable on Metform + Amaryl but A1c= 7.4, rrefuses insulin as well... we discussed diet, exercise, etc. The following medications were removed from the medication list:    Actos 30 Mg Tabs (Pioglitazone hcl) .Marland Kitchen... Take 1 tab by mouth once daily... Her updated medication list for this problem includes:    Aspirin 81 Mg Tbec (Aspirin) .Marland Kitchen... Take by mouth once daily    Altace 10 Mg Tabs (Ramipril) .Marland Kitchen... Take by mouth once daily    Metformin Hcl 1000 Mg Tabs (Metformin hcl) .Marland Kitchen... Take by mouth two times a day    Glimepiride 4 Mg Tabs (Glimepiride) .Marland Kitchen... Take by mouth once daily  Problem  # 5:  GASTRITIS, HX OF (ICD-V12.79) She was on the PPI but stopped this on her own...  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   Problem # 7:  VITAMIN B12 DEFICIENCY (ICD-266.2) DrDBrodie still has her on Vit B12 IM Qmo, & plans Rx thru 5/12... we can consider oral supplementation after that due to Metformin Rx at 1000mg  Bid...  Problem # 8:  OTHER MEDICAL PROBLEMS AS NOTED>>>  Complete Medication List: 1)  Aspirin 81 Mg Tbec (Aspirin) .... Take by mouth once daily 2)  Plavix 75 Mg Tabs (Clopidogrel bisulfate) .... Take 1 tablet by mouth once a day 3)  Atenolol 50 Mg Tabs (Atenolol) .... Take by mouth once daily 4)  Altace 10 Mg Tabs (Ramipril) .... Take by mouth once daily 5)  Hydrochlorothiazide 25 Mg Tabs (Hydrochlorothiazide) .... Take by mouth once daily 6)  Metformin Hcl 1000 Mg Tabs (Metformin hcl) .... Take by mouth two times a day 7)  Glimepiride 4 Mg Tabs (Glimepiride) .... Take by mouth once daily 8)  Simvastatin 40 Mg Tabs (Simvastatin) .... One tablet by mouth once daily 9)  Niaspan 500 Mg Tbcr (Niacin (antihyperlipidemic)) .... Take by mouth once daily 10)  Hydrocodone-acetaminophen 5-500 Mg Tabs (Hydrocodone-acetaminophen) .Marland Kitchen.. 1 tab by mouth q6-8h as needed pain - not to exceed 3 per day. 11)  Clorazepate Dipotassium 7.5 Mg Tabs (Clorazepate dipotassium) .... Take one to three tabs by mouth as needed 12)  Multivitamins Tabs (Multiple vitamin) .... Once daily 13)  Vitamin D 1000 Unit Tabs (Cholecalciferol) .... Take 1  tablets by mouth once daily 14)  Fluticasone Propionate 50 Mcg/act Susp (Fluticasone propionate) .... 2 spin each nostril at bedtime...  Patient Instructions: 1)  Today we updated your med list- see below.... 2)  We refilled your Vicodin per request... 3)  Keep up the good work w/ diet & exercise... 4)  Call for any problems.Marland KitchenMarland Kitchen 5)  Please schedule  a follow-up appointment in 6 months. Prescriptions: FLUTICASONE PROPIONATE 50 MCG/ACT SUSP (FLUTICASONE PROPIONATE) 2 spin each nostril at bedtime...  #1 x prn   Entered and Authorized by:   Michele Mcalpine MD   Signed by:   Michele Mcalpine MD on 12/31/2009   Method used:   Print then Give to Patient   RxID:   [id number] HYDROCODONE-ACETAMINOPHEN 5-500 MG  TABS (HYDROCODONE-ACETAMINOPHEN) 1 tab by mouth q6-8H as needed pain - not to exceed 3 per day.  #100 x 6   Entered and Authorized by:   Michele Mcalpine MD   Signed by:   Michele Mcalpine MD on 12/31/2009   Method used:   Print then Give to Patient   RxID:   [id number]    Immunization History:  Pneumovax Immunization History:    Pneumovax:  historical (10/13/2008)

## 2010-02-03 NOTE — Letter (Signed)
Summary: [facility]   Imported By: Lester McKinnon 12/16/2009 10:08:20  _____________________________________________________________________  External Attachment:    Type:   Image     Comment:   External Document

## 2010-02-03 NOTE — Assessment & Plan Note (Signed)
Summary: EAVWUJW J19/147.8  Nurse Visit   Allergies: 1)  ! Advil 2)  Codeine 3)  * Aleve 4)  Demerol  Medication Administration  Injection # 1:    Medication: Vit B12 1000 mcg    Diagnosis: VITAMIN B12 DEFICIENCY (ICD-266.2)    Route: IM    Site: L deltoid    Exp Date: 10/2011    Lot #: [id number]    Mfr: [employer] Regent    Patient tolerated injection without complications    Given by: Milford Cage NCMA (January 14, 2010 10:12 AM)  Orders Added: 1)  Vit B12 1000 mcg [J3420]

## 2010-02-03 NOTE — Assessment & Plan Note (Signed)
Summary: 266.2  Nurse Visit   Medication Administration  Injection # 1:    Medication: Vit B12 1000 mcg    Diagnosis: VITAMIN B12 DEFICIENCY (ICD-266.2)    Route: IM    Site: L deltoid    Exp Date: 10/2011    Lot #: [id number]    Mfr: [employer] Regent    Comments: Pt will return on 01/13/10 for next injection    Patient tolerated injection without complications    Given by: Francee Piccolo CMA Duncan Dull) (December 16, 2009 10:24 AM)  Orders Added: 1)  Vit B12 1000 mcg [J3420]

## 2010-02-18 ENCOUNTER — Encounter (INDEPENDENT_AMBULATORY_CARE_PROVIDER_SITE_OTHER): Payer: Medicare Other

## 2010-02-18 ENCOUNTER — Encounter: Payer: Self-pay | Admitting: Internal Medicine

## 2010-02-18 DIAGNOSIS — E538 Deficiency of other specified B group vitamins: Secondary | ICD-10-CM

## 2010-02-23 NOTE — Assessment & Plan Note (Signed)
Summary: MONTHLY B12 SHOT...LSW  Nurse Visit   Allergies: 1)  ! Advil 2)  Codeine 3)  * Aleve 4)  Demerol  Medication Administration  Injection # 1:    Medication: Vit B12 1000 mcg    Diagnosis: VITAMIN B12 DEFICIENCY (ICD-266.2)    Route: IM    Site: L deltoid    Exp Date: 11/2011    Lot #: [id number]    Mfr: [employer] Regent    Comments: pt to schedule next monthly b12 at front desk    Patient tolerated injection without complications    Given by: Chales Abrahams CMA Duncan Dull) (February 18, 2010 10:36 AM)  Orders Added: 1)  Vit B12 1000 mcg [J3420]

## 2010-03-03 ENCOUNTER — Telehealth (INDEPENDENT_AMBULATORY_CARE_PROVIDER_SITE_OTHER): Payer: Self-pay | Admitting: *Deleted

## 2010-03-10 NOTE — Progress Notes (Signed)
  Phone Note Other Incoming   Request: Send information Summary of Call: Request for records received from [place] [facility] [employer]. Request forwarded to [facility].  All records

## 2010-03-21 ENCOUNTER — Encounter (INDEPENDENT_AMBULATORY_CARE_PROVIDER_SITE_OTHER): Payer: Self-pay | Admitting: *Deleted

## 2010-03-21 ENCOUNTER — Encounter (INDEPENDENT_AMBULATORY_CARE_PROVIDER_SITE_OTHER): Payer: Medicare Other

## 2010-03-21 DIAGNOSIS — E538 Deficiency of other specified B group vitamins: Secondary | ICD-10-CM

## 2010-03-21 LAB — GLUCOSE, CAPILLARY: Glucose-Capillary: 139 mg/dL — ABNORMAL HIGH (ref 70–99)

## 2010-03-31 NOTE — Assessment & Plan Note (Signed)
Summary: monthly b12  Nurse Visit   Allergies: 1)  ! Advil 2)  Codeine 3)  * Aleve 4)  Demerol  Medication Administration  Injection # 1:    Medication: Vit B12 1000 mcg    Diagnosis: VITAMIN B12 DEFICIENCY (ICD-266.2)    Route: IM    Site: L deltoid    Exp Date: 11/2011    Lot #: [id number]    Mfr: [employer] Regent    Patient tolerated injection without complications    Given by: Merri Ray CMA (AAMA) (March 21, 2010 11:42 AM)  Orders Added: 1)  Vit B12 1000 mcg [J3420]

## 2010-04-08 LAB — GLUCOSE, CAPILLARY
Glucose-Capillary: 118 mg/dL — ABNORMAL HIGH (ref 70–99)
Glucose-Capillary: 120 mg/dL — ABNORMAL HIGH (ref 70–99)
Glucose-Capillary: 127 mg/dL — ABNORMAL HIGH (ref 70–99)
Glucose-Capillary: 130 mg/dL — ABNORMAL HIGH (ref 70–99)
Glucose-Capillary: 138 mg/dL — ABNORMAL HIGH (ref 70–99)
Glucose-Capillary: 148 mg/dL — ABNORMAL HIGH (ref 70–99)
Glucose-Capillary: 161 mg/dL — ABNORMAL HIGH (ref 70–99)
Glucose-Capillary: 164 mg/dL — ABNORMAL HIGH (ref 70–99)
Glucose-Capillary: 172 mg/dL — ABNORMAL HIGH (ref 70–99)
Glucose-Capillary: 177 mg/dL — ABNORMAL HIGH (ref 70–99)
Glucose-Capillary: 184 mg/dL — ABNORMAL HIGH (ref 70–99)
Glucose-Capillary: 221 mg/dL — ABNORMAL HIGH (ref 70–99)
Glucose-Capillary: 230 mg/dL — ABNORMAL HIGH (ref 70–99)

## 2010-04-08 LAB — COMPREHENSIVE METABOLIC PANEL
ALT: 16 U/L (ref 0–35)
AST: 19 U/L (ref 0–37)
Albumin: 3 g/dL — ABNORMAL LOW (ref 3.5–5.2)
Alkaline Phosphatase: 49 U/L (ref 39–117)
Alkaline Phosphatase: 66 U/L (ref 39–117)
BUN: 11 mg/dL (ref 6–23)
BUN: 25 mg/dL — ABNORMAL HIGH (ref 6–23)
CO2: 26 mEq/L (ref 19–32)
Chloride: 104 mEq/L (ref 96–112)
Chloride: 107 mEq/L (ref 96–112)
GFR calc Af Amer: >60 mL/min (ref >60)
GFR calc non Af Amer: 42 mL/min — ABNORMAL LOW (ref >60)
Glucose, Bld: 196 mg/dL — ABNORMAL HIGH (ref 70–99)
Potassium: 4.2 mEq/L (ref 3.5–5.1)
Potassium: 4.2 mEq/L (ref 3.5–5.1)
Total Bilirubin: 0.5 mg/dL (ref 0.3–1.2)
Total Bilirubin: 0.8 mg/dL (ref 0.3–1.2)
Total Protein: 5.7 g/dL — ABNORMAL LOW (ref 6.0–8.3)

## 2010-04-08 LAB — DIFFERENTIAL
Basophils Absolute: 0 10*3/uL (ref 0.0–0.1)
Basophils Absolute: 0 10*3/uL (ref 0.0–0.1)
Basophils Relative: 0 % (ref 0–1)
Basophils Relative: 0 % (ref 0–1)
Eosinophils Absolute: 0 10*3/uL (ref 0.0–0.7)
Eosinophils Absolute: 0.1 10*3/uL (ref 0.0–0.7)
Eosinophils Relative: 0 % (ref 0–5)
Monocytes Absolute: 0.9 10*3/uL (ref 0.1–1.0)
Monocytes Relative: 9 % (ref 3–12)
Neutro Abs: 5.1 10*3/uL (ref 1.7–7.7)
Neutrophils Relative %: 52 % (ref 43–77)

## 2010-04-08 LAB — CBC
HCT: 36.4 % (ref 36.0–46.0)
Hemoglobin: 12.5 g/dL (ref 12.0–15.0)
MCHC: 34.8 g/dL (ref 30.0–36.0)
MCV: 94.1 fL (ref 78.0–100.0)
RBC: 2.96 MIL/uL — ABNORMAL LOW (ref 3.87–5.11)
RBC: 3.86 MIL/uL — ABNORMAL LOW (ref 3.87–5.11)
RDW: 12.3 % (ref 11.5–15.5)

## 2010-04-09 LAB — STOOL CULTURE

## 2010-04-09 LAB — COMPREHENSIVE METABOLIC PANEL
ALT: 23 U/L (ref 0–35)
AST: 29 U/L (ref 0–37)
Albumin: 4.4 g/dL (ref 3.5–5.2)
Alkaline Phosphatase: 68 U/L (ref 39–117)
BUN: 27 mg/dL — ABNORMAL HIGH (ref 6–23)
CO2: 23 mEq/L (ref 19–32)
Calcium: 10.3 mg/dL (ref 8.4–10.5)
Chloride: 106 mEq/L (ref 96–112)
Creatinine, Ser: 1.35 mg/dL — ABNORMAL HIGH (ref 0.4–1.2)
GFR calc Af Amer: 47 mL/min — ABNORMAL LOW (ref >60)
GFR calc non Af Amer: 39 mL/min — ABNORMAL LOW (ref >60)
Glucose, Bld: 228 mg/dL — ABNORMAL HIGH (ref 70–99)
Potassium: 5.3 mEq/L — ABNORMAL HIGH (ref 3.5–5.1)
Sodium: 140 mEq/L (ref 135–145)
Total Bilirubin: 0.7 mg/dL (ref 0.3–1.2)
Total Protein: 7.5 g/dL (ref 6.0–8.3)

## 2010-04-09 LAB — GLUCOSE, CAPILLARY
Glucose-Capillary: 134 mg/dL — ABNORMAL HIGH (ref 70–99)
Glucose-Capillary: 185 mg/dL — ABNORMAL HIGH (ref 70–99)
Glucose-Capillary: 164 mg/dL — ABNORMAL HIGH (ref 70–99)
Glucose-Capillary: 171 mg/dL — ABNORMAL HIGH (ref 70–99)
Glucose-Capillary: 207 mg/dL — ABNORMAL HIGH (ref 70–99)
Glucose-Capillary: 220 mg/dL — ABNORMAL HIGH (ref 70–99)
Glucose-Capillary: 223 mg/dL — ABNORMAL HIGH (ref 70–99)

## 2010-04-09 LAB — URINALYSIS, ROUTINE W REFLEX MICROSCOPIC
Bilirubin Urine: NEGATIVE
Glucose, UA: 100 mg/dL — AB
Hgb urine dipstick: NEGATIVE
Ketones, ur: NEGATIVE mg/dL
Nitrite: NEGATIVE
Protein, ur: NEGATIVE mg/dL
Specific Gravity, Urine: 1.017 (ref 1.005–1.030)
Urobilinogen, UA: 0.2 mg/dL (ref 0.0–1.0)
pH: 5 (ref 5.0–8.0)

## 2010-04-09 LAB — CBC
HCT: 38.6 % (ref 36.0–46.0)
Hemoglobin: 11.2 g/dL — ABNORMAL LOW (ref 12.0–15.0)
Hemoglobin: 13.1 g/dL (ref 12.0–15.0)
MCHC: 34 g/dL (ref 30.0–36.0)
MCV: 95.2 fL (ref 78.0–100.0)
Platelets: 143 10*3/uL — ABNORMAL LOW (ref 150–400)
Platelets: 199 10*3/uL (ref 150–400)
RBC: 3.6 MIL/uL — ABNORMAL LOW (ref 3.87–5.11)
RBC: 4.06 MIL/uL (ref 3.87–5.11)
RDW: 12.5 % (ref 11.5–15.5)
RDW: 12.6 % (ref 11.5–15.5)
WBC: 7.6 10*3/uL (ref 4.0–10.5)
WBC: 16.3 10*3/uL — ABNORMAL HIGH (ref 4.0–10.5)

## 2010-04-09 LAB — BASIC METABOLIC PANEL
BUN: 15 mg/dL (ref 6–23)
BUN: 12 mg/dL (ref 6–23)
CO2: 25 mEq/L (ref 19–32)
Calcium: 8.7 mg/dL (ref 8.4–10.5)
Calcium: 8.7 mg/dL (ref 8.4–10.5)
Chloride: 108 mEq/L (ref 96–112)
Creatinine, Ser: 1.03 mg/dL (ref 0.4–1.2)
GFR calc Af Amer: >60 mL/min (ref >60)
GFR calc non Af Amer: 53 mL/min — ABNORMAL LOW (ref >60)
GFR calc non Af Amer: 52 mL/min — ABNORMAL LOW (ref >60)
Glucose, Bld: 162 mg/dL — ABNORMAL HIGH (ref 70–99)
Glucose, Bld: 228 mg/dL — ABNORMAL HIGH (ref 70–99)
Potassium: 3.8 mEq/L (ref 3.5–5.1)
Sodium: 140 mEq/L (ref 135–145)
Sodium: 141 mEq/L (ref 135–145)

## 2010-04-09 LAB — DIFFERENTIAL
Eosinophils Absolute: 0.1 10*3/uL (ref 0.0–0.7)
Lymphs Abs: 2 10*3/uL (ref 0.7–4.0)
Monocytes Relative: 7 % (ref 3–12)
Neutrophils Relative %: 80 % — ABNORMAL HIGH (ref 43–77)

## 2010-04-09 LAB — APTT: aPTT: 24 seconds (ref 24–37)

## 2010-04-09 LAB — PROTIME-INR
INR: 1 (ref 0.00–1.49)
Prothrombin Time: 13.2 seconds (ref 11.6–15.2)

## 2010-04-09 LAB — URINE MICROSCOPIC-ADD ON

## 2010-04-09 LAB — CLOSTRIDIUM DIFFICILE EIA: C difficile Toxins A+B, EIA: NEGATIVE

## 2010-04-09 LAB — CK TOTAL AND CKMB (NOT AT ARMC)
CK, MB: 3.2 ng/mL (ref 0.3–4.0)
Relative Index: 2.1 (ref 0.0–2.5)

## 2010-04-09 LAB — POCT CARDIAC MARKERS
CKMB, poc: 3.3 ng/mL (ref 1.0–8.0)
Troponin i, poc: <0.05 ng/mL (ref 0.00–0.09)

## 2010-04-09 LAB — LIPASE, BLOOD: Lipase: 48 U/L (ref 11–59)

## 2010-04-22 ENCOUNTER — Ambulatory Visit (INDEPENDENT_AMBULATORY_CARE_PROVIDER_SITE_OTHER): Payer: Medicare Other | Admitting: Internal Medicine

## 2010-04-22 ENCOUNTER — Other Ambulatory Visit: Payer: Self-pay | Admitting: Internal Medicine

## 2010-04-22 DIAGNOSIS — E538 Deficiency of other specified B group vitamins: Secondary | ICD-10-CM

## 2010-04-22 MED ORDER — CYANOCOBALAMIN 1000 MCG/ML IJ SOLN
1000.0000 ug | Freq: Once | INTRAMUSCULAR | Status: AC
  Administered 2010-04-22: 1000 ug via INTRAMUSCULAR

## 2010-05-17 NOTE — H&P (Signed)
[name], [name]            ACCOUNT NO.:  [id number]   MEDICAL RECORD NO.:  [id number]          PATIENT TYPE:  INP   LOCATION:  1823                         FACILITY:  [facility]   PHYSICIAN:  Felipa Evener, MD  DATE OF BIRTH:  [birth year]   DATE OF ADMISSION:  08/14/2008  DATE OF DISCHARGE:                              HISTORY & PHYSICAL   CHIEF COMPLAINTS:  Nausea, vomiting, and diarrhea along with abdominal  pain.   HISTORY OF PRESENT ILLNESS:  Ms. [name] is a 70 year old white female,  never smoker.  Primary care, patient of Dr. Alroy Dust, GI, patient of  Dr. Lina Sar and Cardiac, patient of Dr. Susa Griffins who has a  history of small-bowel carcinoma with resection in 2000 and followed by  6 months of chemotherapy.  She presents with a sudden onset at 2030  hours on August 14, 2008, of nausea, vomiting, diarrhea, and abdominal  pain.  She does note she has had increased abdominal tenderness over 3-4  months that had prevented her from sleeping on her abdomen, which is her  normal method of sleep.  She denies fevers, chills, or sweats, recent  foreign travel, or exposure to any sick individual.  She also denies  rectal bleeding.  Her stools were described as being watery.  She has  point tenderness at the site of a large ventral hernia on her abdomen.  The hernia has been documented in 2008 per GI, Dr. Juanda Chance.  Due to her  nausea, vomiting, diarrhea, abdominal pain, hernia, and her history of  small-bowel carcinoma, she will be admitted for further evaluation and  treatment.   ALLERGIES:  ADVIL, DEMEROL, and TYLENOL No. 3.   PAST MEDICAL HISTORY:  Significant for,  1. Diabetes mellitus.  2. Small bowel carcinoma with resection in 2000.  3. Coronary artery disease status post coronary bypass graft in 1996.  4. Left knee fracture x2 with methicillin-resistant staph aureus      infection requiring redo surgery and prolonged vancomycin      treatment.  5.  Hypertension.  6. Elevated lipids.   MEDICATIONS:  1. Hydrochlorothiazide 25 mg daily.  2. Ramipril 10 mg daily.  3. Simvastatin 20 mg daily.  4. Atenolol 50 mg daily.  5. Aspirin 81 mg daily.  6. Niaspan 500 mg daily.  7. Multivitamins 1 tablet daily.  8. Glimepiride 4 mg daily.  9. Metformin 100 mg daily.  10.Plavix 75 mg daily.   SOCIAL HISTORY:  She is a never smoker.  She is widow.  She just stopped  working for [employer] approximately 4 or 5 months ago.  She has 2 children.   FAMILY HISTORY:  Mother and father both died at early ages of coronary  artery disease.   REVIEW OF SYSTEMS:  Review of systems has been taken extensively.  See  HPI for details.   PHYSICAL EXAMINATION:  GENERAL:  Well-nourished, well-developed white  female in no acute distress at rest.  VITAL SIGNS:  Blood pressure 149/73, heart rate is 81 and sinus,  respiratory rate is 18 and unlabored, sats are 99% on room air,  temperature 97.0.  HEENT:  Without JVD or adenopathy.  Oral mucosa is pink and moist.  CHEST:  Clear to auscultation.  CARDIAC:  Heart sounds are regular rate and rhythm.  ABDOMEN:  Slightly distended, tender especially to touch at the large  ventral hernia site.  She has decreased bowel sounds.  EXTREMITIES:  Warm.  Left knee shows multiple surgical interventions.   LABORATORY DATA:  WBC is elevated at 16.3, hemoglobin 13.1, hematocrit  36.6, platelets are 199.  Sodium 140, potassium is elevated at 5.3,  chloride is 106, CO2 of 23, BUN is 27, creatinine is 1.35, of note at  her baseline, creatinine is 1.0 on July 11, 2008, glucose is 228 and is  elevated.  Troponin-I is less than 0.05.  Her MB is elevated at 304.  CK-  MB is 3.3.  CT of her abdomen was negative except for large ventral  hernia.  INR is 1.0.  AST is 29.  Lipase is 48.  Lactic acid is 3.0.   IMPRESSION AND PLAN:  1. Nausea, vomiting, diarrhea, and abdominal pain in this 70 year old      white female with a large ventral  hernia and a history of small-      bowel carcinoma, therefore,      a.     We will admit for further evaluation and treatment per       Surgery and GI Services.      b.     IV fluids for rehydration.      c.     Antiemetics.      d.     Pain control.  2. Hypertension.  We will hold her anti-hypertensives for now.  3. Diabetes mellitus.  We will,      a.     Hold all her p.o. hypoglycemics at this time.      b.     Place her on sliding-scale sensitive on a q.4 h. basis.  4. History of small bowel carcinoma.  Again she will have followup      with GI and Surgery.  There is no evidence of recurrence on her CT      scan.  5. History of coronary artery disease with coronary artery bypass      graft x5 in 1996 and with increased MB it is most likely secondary      to abdominal process, but we will,      a.     Recheck MBs in the a.m.      b.     Have to get a 12-lead EKG.      c.     Placed her in the unit for completeness.     Devra Dopp, MSN, ACNP      Felipa Evener, MD  Electronically Signed   SM/MEDQ  D:  08/15/2008  T:  08/15/2008  Job:  [id number]

## 2010-05-17 NOTE — Consult Note (Signed)
[name], [name]            ACCOUNT NO.:  [id number]   MEDICAL RECORD NO.:  [id number]          PATIENT TYPE:  INP   LOCATION:  6742                         FACILITY:  [facility]   PHYSICIAN:  Wilmon Arms. Corliss Skains, M.D. DATE OF BIRTH:  05/19/1940   DATE OF CONSULTATION:  08/15/2008  DATE OF DISCHARGE:                                 CONSULTATION   CONSULTING PHYSICIAN:  Felipa Evener, MD   REASON FOR EVALUATION:  Abdominal pain, nausea, vomiting, and diarrhea.   PRIMARY CARE PHYSICIAN:  Lonzo Cloud. Kriste Basque, MD   CARDIOLOGIST:  Pearletha Furl. Alanda Amass, MD   GASTROENTEROLOGIST:  Hedwig Morton. Juanda Chance, MD   HISTORY OF PRESENT ILLNESS:  This is a healthy 70 year old female, who  has a history of small bowel carcinoma, status post small bowel  resection in 2000 by Dr. Rozetta Nunnery.  The patient states that over the  last several years, she has developed an enlarging ventral hernia, which  has been documented by Dr. Juanda Chance.  However, she is now referred for  surgical evaluation.  It has enlarged, and the patient can no longer  sleep on her abdomen as she is used to.  Over the last several months,  she has noticed decreasing amounts of energy.  Over the last several  days, she noticed some vague abdominal discomfort and some mild nausea.  The patient is fairly active and she did a lot of yard work yesterday.  Late in the evening, she began experiencing acute onset of nausea,  vomiting, and diarrhea as well as mid abdominal pain.  This feels  similar to her presentation prior to her small bowel cancer in 2000,  this was true, she became quite concerned, and presented to the  emergency department.  She was admitted by Dr. Molli Knock for hydration and  further evaluation.   A CT scan showed a large midline ventral hernia, but no sign of  obstruction or incarceration.   CURRENT MEDICATIONS:  1. HCTZ 25 mg daily.  2. Ramipril 10 mg daily.  3. Simvastatin 20 mg daily.  4. Atenolol 50 mg daily.  5.  Aspirin 81 mg daily.  6. Niaspan 500 mg daily.  7. Daily multivitamin.  8. Glimepiride 4 mg daily.  9. Metformin 100 mg daily.  10.Plavix 75 mg daily.   ALLERGIES:  TYLENOL NO. 3, DEMEROL, and ADVIL.   PAST MEDICAL HISTORY:  1. Diabetes.  2. Small bowel carcinoma.  3. Coronary artery disease.  4. History of MRSA wound infection.  5. Hypertension.  6. Hyperlipidemia.   PAST SURGICAL HISTORY:  1. Coronary artery bypass graft in 1996.  2. Small bowel resection in 2000.   SOCIAL HISTORY:  The patient is a nonsmoker.  She is widowed.   FAMILY HISTORY:  Positive for coronary artery disease in both parents.   PHYSICAL EXAMINATION:  VITAL SIGNS:  Temperature 97.9, pulse 72, blood  pressure 126/64, respirations 18, and sats 97% on room air.  GENERAL:  This is a well-developed and well-nourished female, in no  apparent distress.  HEENT:  EOMI.  Sclerae anicteric.  NECK:  No mass.  No thyromegaly.  LUNGS:  Clear.  Normal respiratory effort.  HEART:  Regular rate and rhythm.  No murmur.  ABDOMEN:  Positive bowel sounds, soft, visible palpable midline ventral  hernia located above the umbilicus.  When she is relaxed supine, this is  mostly reducible.  It does cause some discomfort with reducing the  hernia.  However, the abdomen does not seem tense or tight.   LABORATORIES:  White count 16.3, hemoglobin 13.1, and platelet count  199.  Potassium 5.3, BUN 27, and creatinine 1.35.   CT scan shows a large ventral hernia with no sign of obstruction.  Plain  films also showed no sign of obstruction.  The studies were otherwise  negative.   IMPRESSION:  There is no clear sign of obstruction from the ventral  hernia.  The hernia remains reducible.  We will continue to follow with  you.  The patient may start clear liquids.  She will need repair of her  hernia soon, but this should be schedule on elective basis.  She may  follow up with [place] as an outpatient.  We will continue follow along  with  you here in the hospital.  We will continue with IV hydration and slowly  advancing her diet.      Wilmon Arms. Tsuei, M.D.  Electronically Signed     MKT/MEDQ  D:  08/15/2008  T:  08/15/2008  Job:  [id number]   cc:   Lonzo Cloud. Kriste Basque, MD  Richard A. Alanda Amass, M.D.  Hedwig Morton. Juanda Chance, MD

## 2010-05-17 NOTE — Discharge Summary (Signed)
[name]            ACCOUNT NO.:  [id number]   MEDICAL RECORD NO.:  [id number]          PATIENT TYPE:  INP   LOCATION:  6742                         FACILITY:  [facility]   PHYSICIAN:  Felipa Evener, MD  DATE OF BIRTH:  04-14-1940   DATE OF ADMISSION:  08/14/2008  DATE OF DISCHARGE:  08/17/2008                               DISCHARGE SUMMARY   DISCHARGE DIAGNOSES:  1. Ventral hernia with abdominal pain, nausea, and vomiting.  2. Dehydration.  3. Hypertension.  4. Diabetes.  5. History of coronary artery disease.   HISTORY OF PRESENT ILLNESS:  Please see full history and physical  dictated by Dr. Jefm Miles on the day of admission.  [name] is a  70 year old female who presented on August 15, 2008, with sudden onset  of abdominal pain, nausea, vomiting, and diarrhea, did note that she has  had increased abdominal tenderness over the past several months, which  occasionally disrupted her sleeping, however, the abdominal pain as well  as nausea and vomiting did begin abruptly.  She denied fevers, chills,  sweats, or any sick exposures.  She also denied any rectal bleeding.  The patient was admitted for further evaluation as well as a surgical  evaluation for her large ventral hernia.   RADIOLOGY DATA:  At the time of admission August 15, 2008.  CT abdomen  and pelvis showed stable large ventral abdominal hernia containing  transverse colon without evidence of bowel obstruction.  Stable  abdominal viscera had no acute findings in the pelvis.   LABORATORY DATA:  At time of admission on August 15, 2008.  CBC white  blood cells 16.3, hemoglobin 13.1, hematocrit 38.6, and platelets 199.  Cardiac panel:  CK-MB of 3.3 and troponin less than 0.05.  PT 13.2 and  INR 1.0.  Complete metabolic panel:  Sodium 140, potassium 5.3, glucose  228, BUN 27, creatinine 1.35, lipase of 48, and lactic acid 3.0.  Urinalysis was negative except for moderate leukocyte.   Most recent  laboratory data at the time of discharge on August 17, 2008:  Basic metabolic panel:  Sodium 140, potassium 3.8, glucose 162, BUN 15,  and creatinine 1.03.  CBC:  White blood cells 7.6, hemoglobin 11.8,  hematocrit 34.4, and platelets 176.  Hemoglobin A1c was 8.3.   CULTURE DATA:  Stool culture preliminary report from August 15, 2008,  shows no suspicious colonies of C. diff toxin from August 15, 2008,  final report is negative.   HOSPITAL COURSE BY DISCHARGE DIAGNOSES:  1. Ventral hernia with abdominal pain, nausea, and vomiting:  At time      of discharge, this has significantly improved.  [facility], Dr. Jamey Ripa did consult on the patient during this      admission and did recommend an elective repair of [name]'s      large ventral hernia.  She will set up an outpatient appointment      for further evaluation by surgery and discussion of an elective      ventral hernia repair.  The patient's white blood cells are back  to      normal.  At time of discharge, she is afebrile.  She is tolerating      a p.o. diet with minimal abdominal pain, nausea and vomiting have      resolved.  2. Dehydration:  This is also improved.  Renal function is back to the      patient's baseline.  After approximately 2 days of IV hydration,      she is again tolerating full p.o. diet without any nausea or      vomiting.  The patient is encouraged to continue eating and      drinking well.  3. Hypertension.  Initially, on admission, the patient was slightly      dehydrated and her blood pressure was borderline.  As she has been      rehydrated, blood pressure has come back to normal.  Her home      antihypertensives were restarted.  She has been tolerating those      well and she will go home on her normal antihypertensives.  4. Diabetes.  Again, when the patient was initially admitted and was      nauseous and having some vomiting, her oral diabetic medications      were held.   However, as she began to tolerate p.o.'s, her blood      sugars increased rapidly and she was started back on her home      diabetes meds with some improvement in her blood sugar.  She will      be discharged on her regular diabetic medications.  She was treated      on an inpatient basis with sliding scale insulin.  5. History of coronary artery disease and coronary artery bypass      graft.  A 12-lead was performed during this hospitalization was      unchanged from the patient's previous.  Her cardiac enzymes were      within normal limits.  She will go home and continue to take her      aspirin and Plavix.   DISCHARGE DIET:  The patient is to be discharged on regular diabetic  carbohydrate-modified diet as tolerated.  She is to call the office if  she continues to have any nausea, vomiting, or is unable to keep food or  fluids down.   ACTIVITY:  The patient is discharged with activity as tolerated.   DISCHARGE MEDICATIONS:  1. Metformin 1000 mg p.o. of b.i.d. with meals.  2. Atenolol 50 mg p.o. daily.  3. Ramipril 10 mg p.o. daily.  4. Niaspan 500 mg extended release p.o. daily.  5. Aspirin 81 mg p.o. daily.  6. B12 2000 mEq p.o. daily.  7. Plavix 75 mg p.o. daily.  8. Multivitamin p.o. daily.  9. Simvastatin 40 mg p.o. daily.  10.Glimepiride 4 mg p.o. daily.  11.Hydrochlorothiazide 25 mg p.o. daily.  12.Flonase 2 sprays each nostril daily.  Note, this is a new      medication for this patient.  A prescription was given.   FOLLOWUP:  The patient is to follow up with Dr. Ezzard Standing at [facility] in approximately 2-4 weeks.  She has been given the  phone number and she is to make this appointment herself.  The patient  is also to return to Dr. Kriste Basque, her primary care physician in  approximately 4-6 weeks, phone number was given to the patient to make  this appointment herself.   DISPOSITION:  The patient  is to be discharged home in improved  condition.  She has  met maximum benefit from her inpatient  hospitalization.  She is no longer having nausea and vomiting, and her  abdominal pain has subsided.  She will follow up an outpatient basis  with [facility] for work up for an elective ventral hernia  repair.      Dirk Dress, NP      Marius Ditch Jefm Miles, MD  Electronically Signed    KW/MEDQ  D:  08/17/2008  T:  08/17/2008  Job:  [id number]   cc:   Lonzo Cloud. Kriste Basque, MD  Sandria Bales. Ezzard Standing, M.D.

## 2010-05-18 ENCOUNTER — Telehealth: Payer: Self-pay | Admitting: *Deleted

## 2010-05-18 ENCOUNTER — Other Ambulatory Visit (INDEPENDENT_AMBULATORY_CARE_PROVIDER_SITE_OTHER): Payer: Medicare Other

## 2010-05-18 DIAGNOSIS — E538 Deficiency of other specified B group vitamins: Secondary | ICD-10-CM

## 2010-05-18 NOTE — Telephone Encounter (Signed)
Message copied by Jesse Fall on Wed May 18, 2010  1:52 PM ------      Message from: [place], [place]      Created: Wed May 18, 2010  1:42 PM       Please call pt with normal B 12 level. OK to stop x 6 months, then recheck. B12

## 2010-05-18 NOTE — Telephone Encounter (Signed)
Patient given lab results and Dr. Regino Schultze recommendation. Lab in EPIC and note to remind patient. Patient states her B12 level was 544 last time and Dr. Juanda Chance wanted her to get 6 more months of B12. Today, B 12 level is 465 and she said to stop for 6 months and recheck. She wants to know why stop now? Please, advise.

## 2010-05-19 ENCOUNTER — Telehealth: Payer: Self-pay | Admitting: *Deleted

## 2010-05-19 NOTE — Telephone Encounter (Signed)
Per Dr. Juanda Chance may stop VItamin B 12

## 2010-05-20 NOTE — Assessment & Plan Note (Signed)
[facility]                         GASTROENTEROLOGY OFFICE NOTE   [name], [name]                     MRN:          [id number]  DATE:04/24/2006                            DOB:          11-Aug-1940    [name] is a delightful, 70 year old, white female with a history  of small bowel carcinoma in 2000 resected and doing quite well. 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 She, one week ago, developed crampy abdominal pain  and diarrhea. She also has been nauseated. There has been no fever or  rectal bleeding. She has been able to work every day so far. Stools are  watery. She denies any foreign travel or taking any recent antibiotics.  Her weight has decreased about 3 pounds over the past week. In the last  24-48 hours, the patient has been clinically improved. She has not been  eating much except for liquids.   MEDICATIONS:  1. Glimepiride 4 mg p.o. daily.  2. Altace 10 mg p.o. daily.  3. Lipitor 20 mg p.o. daily.  4. Atenolol 50 mg p.o. daily.  5. Niaspan 500 mg p.o. daily.  6. HCTZ 25 mg p.o. daily.  7. Protegra vitamin.  8. Aspirin 81 mg p.o. daily.   PHYSICAL EXAMINATION:  VITAL SIGNS:  Blood pressure 120/74, pulse 64 and  weight 152.8 pounds.  GENERAL:  She was in no distress.  HEENT:  Her color was good. Sclera is nonicteric.  LUNGS:  Clear to auscultation.  COR:  Normal S1, normal S2.  ABDOMEN:  Soft, protuberant with large ventral hernia which was rather  firm and could not be easily reduced. This was above the umbilicus and  extended over across the mid abdomen. It was tender in several areas  especially the superior aspect of the hernia and left aspect. The loops  of the bowel which were palpable through the large hernia. There was no  rebound. No erythema of the overlying skin. Bowel sounds  were normal  active.  RECTAL:  Showed yellow Hemoccult negative stool.   IMPRESSION:  5. 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  2. Large ventral hernia postoperatively. Rule out partial obstruction      due to encapsulated hernia. There is no evidence of acute abdomen.   PLAN:  1. CT scan of the abdomen with IV and oral contrast.  2. CBC. CEA level and BMET today.  3. Phenergan 12.5 mg, dispensed 20, one p.o. q.6 h p.r.n. nausea.  4. Cipro 250 p.o. b.i.d. empirically for diarrhea.  5. Stay on liquids and low-residue diet until nausea subsides.  6. Depending on the results of the CT scan, she may need  further      evaluation.     Hedwig Morton. Juanda Chance, MD  Electronically Signed    DMB/MedQ  DD: 04/24/2006  DT: 04/24/2006  Job #: [id number]   cc:   Lonzo Cloud. Kriste Basque, MD

## 2010-05-20 NOTE — Procedures (Signed)
[facility]  Patient:    [name], [name]                   MRN: [id number] Proc. Date: 06/29/00 Adm. Date:  [id number] Attending:  Mervin Hack CC:         Genene Churn. Cyndie Chime, M.D.  Lowell C. Catha Gosselin, M.D.   Procedure Report  PROCEDURE:  Enteroscopy and colonoscopy.  SURGEON:  Hedwig Morton. Juanda Chance, M.D.  INDICATIONS:  This is a 70 year old white female who was diagnosed with adenocarcinoma of the small bowel approximately three years ago after she presented with intermittent small bowel obstruction and Hemoccult positive stools.  She had a resection and has done very well in terms of symptoms.  She has gained weight and denies any abdominal pain.  On physical exam on June 04, 2000, her stool was Hemoccult negative.  She is now undergoing follow up enteroscopy and colonoscopy.  Previous exams prior to the surgery did fail to reach the lesion in the small bowel.  ENDOSCOPE:  Olympus single channel video endoscope.  SEDATION:  Versed 8 mg IV and Demerol 80 mg IV.  FINDINGS:  The Olympus single channel video endoscope was passed under direct vision through the posterior pharynx into the esophagus.  The patient was monitored by pulse oximeter and oxygen saturations were normal.  The proximal and distal esophageal mucosa was unremarkable.  The squamocolumnar junction appears normal.  There was no hiatal hernia or stricturing.  Stomach:  The stomach was insufflated air.  There were a few superficial erosions scattered throughout the prepyloric antrum.  These were of no clinical significance.  Retroflexion of the endoscope revealed normal fundus and cardia.  Pyloric outlet was normal.  Duodenum:  The duodenal bulb was unremarkable.  The endoscope passed easily through the descending duodenum, second portion, third portion, through the ligament of Treitz through the jejunum to the level of 140 cm from the incisors.  The lumen appears normal.   There was no dilatation, inflammatory changes, or obstruction.  The endoscope passed over the entire length of the scope into the small bowel.  We were unable to reach the anastomosis which was clearly localized distal to the 140 cm from the incisors.  The endoscope was then slowly retracted as the mucosa was observed and no abnormality was noted.  IMPRESSION:  Normal enteroscopy to 140 cm from the incisors.  PLAN:  Colonoscopy. DD:  06/29/00 TD:  06/29/00 Job: [id number]

## 2010-05-20 NOTE — Procedures (Signed)
[facility]  Patient:    [name], [name]                   MRN: [id number] Proc. Date: 06/29/00 Adm. Date:  [id number] Attending:  Mervin Hack CC:         Genene Churn. Cyndie Chime, M.D.  Lowell C. Catha Gosselin, M.D.   Procedure Report  PROCEDURE:  Colonoscopy.  SURGEON:  Hedwig Morton. Juanda Chance, M.D.  ENDOSCOPE:  Olympus single channel video endoscope.  SEDATION:  Additional Versed 2 mg IV and Demerol 20 mg IV.  FINDINGS:  The Olympus single channel videoscope was passed from the rectum to the sigmoid colon.  The patient again monitored by pulse oximeter.  Oxygen saturation remains normal.  Her prep was excellent.  Anal canal and rectal ampulla was normal.  There were no significant hemorrhoids.  The sigmoid colon also showed normal mucosa.  There were no significant diverticula.  The patient has a history of colon polyps, but there were no recurrent polyps on this exam.  The splenic flexure, transverse colon, and hepatic flexure was unremarkable.  The right colon and cecal pouch were reached without difficulty.  The ileocecal valve as well as the appendiceal ________ was normal.  The colonoscope was then retracted and the colon decompressed.  The patient tolerated the procedure well.  IMPRESSION:  Normal colonoscopy to the cecum.  PLAN:  Since the patients enteroscopy and colonoscopy is normal, she will follow up with her oncologist.  We suggest that she has a repeat exam in three years or as per suggestion of Dr. Catha Gosselin.  She is currently asymptomatic.  We will see her on a yearly basis.  She needs mainly frequent Hemoccult cards to rule out occult GI blood loss and possible CT scan of the abdomen if she develops specific symptoms. DD:  06/29/00 TD:  06/29/00 Job: [id number]

## 2010-05-20 NOTE — Telephone Encounter (Signed)
Patient notified that per Dr. Juanda Chance, she had has 2 normal Vit. B 12 levels so she may stop the injections for 6 months then recheck.

## 2010-06-29 ENCOUNTER — Encounter: Payer: Self-pay | Admitting: Pulmonary Disease

## 2010-06-30 ENCOUNTER — Other Ambulatory Visit (INDEPENDENT_AMBULATORY_CARE_PROVIDER_SITE_OTHER): Payer: Medicare Other

## 2010-06-30 ENCOUNTER — Ambulatory Visit (INDEPENDENT_AMBULATORY_CARE_PROVIDER_SITE_OTHER): Payer: Medicare Other | Admitting: Pulmonary Disease

## 2010-06-30 DIAGNOSIS — I10 Essential (primary) hypertension: Secondary | ICD-10-CM

## 2010-06-30 DIAGNOSIS — E538 Deficiency of other specified B group vitamins: Secondary | ICD-10-CM

## 2010-06-30 DIAGNOSIS — I2510 Atherosclerotic heart disease of native coronary artery without angina pectoris: Secondary | ICD-10-CM

## 2010-06-30 DIAGNOSIS — F329 Major depressive disorder, single episode, unspecified: Secondary | ICD-10-CM

## 2010-06-30 DIAGNOSIS — Z79899 Other long term (current) drug therapy: Secondary | ICD-10-CM

## 2010-06-30 DIAGNOSIS — I872 Venous insufficiency (chronic) (peripheral): Secondary | ICD-10-CM

## 2010-06-30 DIAGNOSIS — F3289 Other specified depressive episodes: Secondary | ICD-10-CM

## 2010-06-30 DIAGNOSIS — M1990 Unspecified osteoarthritis, unspecified site: Secondary | ICD-10-CM

## 2010-06-30 DIAGNOSIS — E119 Type 2 diabetes mellitus without complications: Secondary | ICD-10-CM

## 2010-06-30 DIAGNOSIS — D126 Benign neoplasm of colon, unspecified: Secondary | ICD-10-CM

## 2010-06-30 DIAGNOSIS — K219 Gastro-esophageal reflux disease without esophagitis: Secondary | ICD-10-CM

## 2010-06-30 DIAGNOSIS — E785 Hyperlipidemia, unspecified: Secondary | ICD-10-CM

## 2010-06-30 LAB — BASIC METABOLIC PANEL
BUN: 32 mg/dL — ABNORMAL HIGH (ref 6–23)
Creatinine, Ser: 1.5 mg/dL — ABNORMAL HIGH (ref 0.4–1.2)
GFR: 35.31 mL/min — ABNORMAL LOW (ref >60.00)

## 2010-06-30 LAB — HEMOGLOBIN A1C: Hgb A1c MFr Bld: 9.4 % — ABNORMAL HIGH (ref 4.6–6.5)

## 2010-06-30 NOTE — Progress Notes (Signed)
Subjective:    Patient ID: [employer], female    DOB: Jul 22, 1940, 70 y.o.   MRN: [id number]  HPI 69 y/o WF here for a follow up visit... she has mult med problems as noted below...  ~  November 05, 2008:  she was last seen in office 6/09 & hosp x2 recently- 8/13-16 and 9/21-26/10 w/ ventral hernia containing incarcerated colon... had lap ventral hernia repair by [facility] on 09/22/08... she has improved but still weak, wt down 5#, feeling sl depressed & asking for Lexapro Rx... she's already had the 2010 flu vaccine...   ~  May 04, 2009:  15mo f/u & feeling better overall but still has some GI issues and needs ROV w/ DrDBrodie... hasn't been taking Prilosec & will write for OMEP40 daily before dinner... also c/o ear discomfort & exam shows 4+wax w/ prev disimpaction by [facility] & she will f/u w/ him as well... she denies CP, BP controlled on meds, due for f/u FLP & DM labs- she wants to start back on Actos she says...  ~  December 31, 2009:  88mo ROV- c/o sinus symptoms & we discussed OTC antihist, saline, Mucinex, & Flonase Qhs... she had f/u DrWeintraub 11/11 (HBP, ASHD, Hyperlipid, DM) w/ labs done> see below... she also had f/u DrDBrodie 6/11 w/ EGD showing antral gastritis (neg HPylori) & retained food- rx'd OMEP40 but she has since stopped this med; & Colonoscopy w/ 2 polyps & int hems- path= tub adenomas & f/u planned 88yrs... Fe normal at 93, B12 was borderline low at 250 (could be Metformin related) & she started B12 shots thru GI dept...  BP controlled on meds;  she will f/u FLP later;  recent A1c= 7.4 on Metform & Glimep- she refuses Januvia & Actos;  requesting refill Vicodin today.  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 & stopped her Simva40 at that time, he did some blood work & the pt tells  me that everything was normal x her BS=250 range (we don't have copies);  Since stopping the Simva40 she feels perhaps sl better; she is also c/o some loose stool/ diarrhea & she is on Metformin 1000mg  bid> asked to incr fiber intake for now & watch this; she also reiterated her concern over DrBrodie stopping her B12 shots> avail labs in EPIC shows that 1st B12 in 5/11 was 250 & DrBrodie started monthly B12 shots (see below); pt is concerned about stopping this Rx but she was never given a trial of oral medication> rec starting 1021mcg/d oral B12 supplement & we will f/u level...  We discussed the need for BMet (BS=207) & A1c (9.4) now for f/u of her DM & medication adjustment accordingly (rec> incr Glimep 4mg Bid & add Onglyza 5mg /d); she will try to get labs from [facility] for [place] to review; I will see her back here in 68mo for recheck of all this...   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 denies CP, palpit, syncope, edema, etc. ~  Myoview 12/08 showed perfusion defect w/ infarct/ scar & perinfarct ischemia... ~  subseq Cath 12/08 showed progressive CIRC dis & patent grafts- med Rx... ~  seen  by [name]/ [facility] 9/10 w/ Myoview & 2DEcho- cleared for ventral hernia surg. ~  she continues regular f/u w/ [name], [facility]... his notes are reviewed.  HYPERTENSION (ICD-401.9) - on ATENOLOL 50mg /d, ALTACE 10mg /d, HCTZ 25mg /d... BP= 114/70 & taking meds regularly... denies HA, visual changes, CP, palipit, syncope, dyspnea, edema, etc...   VENOUS INSUFFICIENCY (ICD-459.81) - on low sodium diet, elevates legs, wear support hose when able...  HYPERLIPIDEMIA (ICD-272.4) - on SIMVASTATIN 40mg /d & NIASPAN 500mg /d... tolerates meds fair & takes her ASA prior to the Niacin Qhs... prev on Lip20 but stopped due to $$... ~  FLP 6/08 on Lip20 showed TChol 118, TG 155, HDL 41, LDL 46 ~  FLP 12/08 on Lip20 showed  TChol 129, TG 201, HDL 37, LDL 56 ~  FLP 6/09 on Lip20 showed TChol 113, TG 162, HDL 34, LDL 47 ~  FLP 11/10 on Simva40+Niasp500 showed TChol 183, TG 347, HDL 49, LDL 72 ~  FLP 5/11 on Simva40+Niasp500 showed TChol 147, TG 307, HDL 40, LDL 49... rec better low fat diet.  DM (ICD-250.00) - on METFORMIN 1000mg Bid, & AMARYL 4mg /d... she refuses Januvia ($$), and Actos (worried about bladder cancer). ~  labs 6/08 showed BS 110, HgA1C 6.6 ~  labs 12/08 showed BS= 170, HgA1c= 7.2 ~  labs 6/09 showed BS= 210, A1c= 8.0.Marland Kitchen. rec> add Actos 15mg /d but pt never did due to $$. ~  labs 11/10 showed BS= 189, A1c= 7.6 ~  she refuses Januvia- "it made my sugar go higher" ~  5/11> she is asking to restart ACTOS 30mg /d because home BS= 150-200 range. ~  labs 5/11 showed BS= 162, A1c= 7.4... later refused Actos due to risk of bladder cancer. ~  labs 11/11 by [facility] showed BS= 117, A1c= 7.4.Marland KitchenMarland Kitchen rec to continue Metform & Glimep. ~  Labs 6/12 showed BS= 207, A1c= 9.4.Marland KitchenMarland Kitchen rec to continue Metform, incr Glimep4mg Bid & add Onglyza5mg /d.  GERD (ICD-530.81) - prev on Prilosec OTC Prn- rec ch to OMEPRAZOLE 40mg /d before dinner... ~  last EGD 12/06 by DrDBrodie showed mild antral gastritis, RUT neg, ?from ASA... Rx'd Pepcid 40mg /d. ~  5/11:  c/o indigestion in eve> rec Omep40 before dinner & antireflux regimen... f/u w/ GI. ~  EGD 6/11 by DrDBrodie w/ antral gastritis & retained food, neg HPylori- Rx'd PPI.  COLONIC POLYPS (ICD-211.3) ~  colonoscopy 12/06 by DrDBrodie showed 8mm polyp in sigmoid area= ?path not avail... ~  colonoscopy 6/11 showed 2 polyps & int hems, bx= tub adenomas & f/u planned 54yrs.  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 post op oncology DrShinn w/  adjuvant chemotherapy- 5-FU/ Leucovorin x 26weeks finished 12/00... ~  she has been followed by DrDBrodie since DrShinn left [place]... ~  small bowel capsule endoscopy 1/07 was neg- no recurrent tumor seen, 2 tiny AVM's noted... ~  CT Abd/ Pelvis 8/10 w/ large ventral hernia containing transverse colon & mesentery- no obstruction, ?hep steatosis, lumbar DJD. ~  UGI & Sm Bowel Series 9/10 showed no HH or reflux seem, ventral hernia w/ transverse colon, no obstruction or mass in sm bowel. HERNIA, VENTRAL (ICD-553.20) ** see 9/10 Hosp>> lap ventral hernia repairw/ reduction incarcerated colon...  DEGENERATIVE JOINT DISEASE (ICD-715.90) - she is s/p left knee arthroscopy 1996... ~  labs 5/11 showed Vit D level =  43  HEADACHE (ICD-784.0) - hx mixed HA's in the past...  ANXIETY & DEPRESSION (ICD-311) - husb passed away in Jun 01, 2002... she has CHLORAZEPATE 7.5mg  as needed... ~  11/10: pt requesting LEXAPRO 10mg /d... in f/u she notes improvement & decided to stop Rx.  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 ~  labs 11/10 showed Hg= 11.8 ~  labs 5/11 showed Hg= 11.5, MCV= 93, Fe= 93 (24%sat), B12= 250 (DrDBrodie started B12 IM monthly. ~  labs 11/11 by [facility] showed Hg= 12.2, B12= 544 & DrBrodie continued the monthly shots. ~  Labs 06/01/2022 showed B12= 465 & pt indicates that DrBrodie stopped the B12 shots... ~  6/12:  I reviewed her chart & discussed w/ pt> we decided to Rx w/ OTC Women's Formula MVI AND Vit B12 1051mcg/d orally for now...   Past Surgical History  Procedure Date  . Cabg x 5 1997  . Cesarean section   . Left knee surgery   . Tonsillectomy   . Tubal ligation   . Resection of small bowel carcinoma 06/1998    Dr. Samuella Cota  . Lap ventral hernia repair with incarcerated colon 09/2008    Dr. Ezzard Standing    Outpatient Encounter Prescriptions as of 06/30/2010  Medication Sig  Dispense Refill  . aspirin 81 MG tablet Take 81 mg by mouth daily.        Marland Kitchen atenolol (TENORMIN) 50 MG tablet Take 50 mg by mouth daily.        . cholecalciferol (VITAMIN D) 1000 UNITS tablet Take 1,000 Units by mouth daily.        . clopidogrel (PLAVIX) 75 MG tablet Take 75 mg by mouth daily.        . clorazepate (TRANXENE) 7.5 MG tablet Take 7.5 mg by mouth 3 (three) times daily.        . fluticasone (FLONASE) 50 MCG/ACT nasal spray Place 2 sprays into the nose daily.        Marland Kitchen glimepiride (AMARYL) 4 MG tablet Take 4 mg by mouth daily before breakfast.        . hydrochlorothiazide 25 MG tablet Take 25 mg by mouth daily.        Marland Kitchen HYDROcodone-acetaminophen (VICODIN) 5-500 MG per tablet Take 1 tablet by mouth every 6 (six) hours as needed. For pain---not to exceed 3 per day       . metFORMIN (GLUCOPHAGE) 1000 MG tablet Take 1,000 mg by mouth 2 (two) times daily with a meal.        . Multiple Vitamin (MULTIVITAMIN) capsule Take 1 capsule by mouth daily.        . niacin (NIASPAN) 500 MG CR tablet Take 500 mg by mouth at bedtime.        . ramipril (ALTACE) 10 MG capsule Take 10 mg by mouth daily.        . simvastatin (ZOCOR) 40 MG tablet Take 40 mg by mouth at bedtime.          Allergies  Allergen Reactions  . Codeine     REACTION: makes her nervous  . Ibuprofen     REACTION: nervous  . Meperidine Hcl     REACTION: nasuea and vomitting  . Naproxen Sodium     REACTION: nervous    Review of Systems       See HPI - all other systems neg except  as noted...      The patient complains of depression.  The patient denies anorexia, fever, weight loss, weight gain, vision loss, decreased hearing, hoarseness, chest pain, syncope, dyspnea on exertion, peripheral edema, prolonged cough, headaches, hemoptysis, abdominal pain, melena, hematochezia, severe indigestion/heartburn, hematuria, incontinence, muscle weakness, suspicious skin lesions, transient blindness, difficulty walking, unusual weight  change, abnormal bleeding, enlarged lymph nodes, and angioedema.     Objective:   Physical Exam      WD, WN, 70 y/o WF in NAD... GENERAL:  Alert & oriented; pleasant & cooperative... HEENT:  [place]/AT, EOM-wnl, PERRLA, EACs- sl red w/ cerumen; NOSE-clear, THROAT-mild redness, no exudate NECK:  Supple w/ adeq ROM; no JVD; normal carotid impulses w/o bruits; no thyromegaly or nodules palpated; no lymphadenopathy. CHEST:  Clear to P & A; without wheezes/ rales/ or rhonchi heard... HEART:  Regular Rhythm; without murmurs/ rubs/ or gallops detected... ABDOMEN: scar of prev surg, abd panniculus, normal bowel sounds; no organomegaly or masses palpated... EXT: mild dupytren's scarring in both palms, no obvious arthritic changes, no varicose veins/ venous insuffic/ or edema. NEURO:  CN's intact; motor testing normal; sensory testing normal; gait normal & balance OK. DERM:  No lesions noted; no rash etc...   Assessment & Plan:   DM>  Poorly controlled & we reviewed diet, exercise, & wt reduction;  W/ A1c up to 9.5 she is warned that Insulin is her next step> for now continue Metformin 1000mg Bid, incr Glimepiride to 4mg Bid, ands add ONGLYZA 5mg /d...  ASHD>  Followed by [name] & his 6/12 note is reviewed... He plans f/u 2DEcho & Stress test...  HBP>  Controlled on meds, continue same, get wt down some...  CHOL>  [name] held her Statin Rx (Simva40) due to weakness, she says sl better off this med, & she will f/u w/ him & his suggestions...  GI>  GERD, Colon Polyps>  On Omep40mg /d & followed by DrDBrodie...  DJD>  She uses Vicodin as needed...  Anxiety>  She takes Tranxene as needed...  Anemia>  We reviewed her prev B12 labs & decided to start her on oral B12 supplement ~1043mcg/d... We will f/u labs later.

## 2010-06-30 NOTE — Patient Instructions (Signed)
Today we updated your med list in EPIC...    Be sure to take a good Women's Formula Multivit daily & Vit B12 daily...  Today we did your follow up blood work to check your diabetes...    Please call the [facility] in a few days for your results...    Dial [id number] & when prompted enter your patient number followed by the # symbol...    Your patient number is:  [id number]#  Let's get on track w/ our low carb, no sweets diet!!!  Increase your exercise program...  Call for any questions...  Let's plan a recheck to be sure your sugars are improving in 3 months.Marland KitchenMarland Kitchen

## 2010-07-01 ENCOUNTER — Other Ambulatory Visit: Payer: Self-pay | Admitting: *Deleted

## 2010-07-01 MED ORDER — SAXAGLIPTIN HCL 5 MG PO TABS: 5.0000 mg | ORAL_TABLET | Freq: Every day | ORAL | Status: DC

## 2010-07-01 MED ORDER — GLIMEPIRIDE 4 MG PO TABS: 4.0000 mg | ORAL_TABLET | Freq: Two times a day (BID) | ORAL | Status: DC

## 2010-07-10 ENCOUNTER — Other Ambulatory Visit: Payer: Self-pay | Admitting: Adult Health

## 2010-07-11 ENCOUNTER — Encounter: Payer: Self-pay | Admitting: Pulmonary Disease

## 2010-07-14 NOTE — Telephone Encounter (Signed)
Electronic refill request from [facility] [address] for: metformin 1000mg  bid, hctz 25mg  qd, niaspan 500 qd, plavix 75mg  qd, ramipril 10mg  qd, and atenolol 50mg  qd.  Pt last seen by SN 6.28.12 and has upcoming appt sched for 10.1.12.  90day supply given with 3 refills on each.

## 2010-08-18 ENCOUNTER — Other Ambulatory Visit: Payer: Self-pay | Admitting: Pulmonary Disease

## 2010-08-23 ENCOUNTER — Other Ambulatory Visit: Payer: Self-pay | Admitting: *Deleted

## 2010-08-23 MED ORDER — HYDROCODONE-ACETAMINOPHEN 5-500 MG PO TABS: 1.0000 | ORAL_TABLET | Freq: Four times a day (QID) | ORAL | Status: DC | PRN

## 2010-09-01 ENCOUNTER — Ambulatory Visit (INDEPENDENT_AMBULATORY_CARE_PROVIDER_SITE_OTHER): Payer: Medicare Other | Admitting: Pulmonary Disease

## 2010-09-01 ENCOUNTER — Encounter: Payer: Self-pay | Admitting: Pulmonary Disease

## 2010-09-01 DIAGNOSIS — E785 Hyperlipidemia, unspecified: Secondary | ICD-10-CM

## 2010-09-01 DIAGNOSIS — I10 Essential (primary) hypertension: Secondary | ICD-10-CM

## 2010-09-01 DIAGNOSIS — R109 Unspecified abdominal pain: Secondary | ICD-10-CM

## 2010-09-01 DIAGNOSIS — K589 Irritable bowel syndrome without diarrhea: Secondary | ICD-10-CM

## 2010-09-01 DIAGNOSIS — I2510 Atherosclerotic heart disease of native coronary artery without angina pectoris: Secondary | ICD-10-CM

## 2010-09-01 DIAGNOSIS — C179 Malignant neoplasm of small intestine, unspecified: Secondary | ICD-10-CM

## 2010-09-01 DIAGNOSIS — F329 Major depressive disorder, single episode, unspecified: Secondary | ICD-10-CM

## 2010-09-01 DIAGNOSIS — E119 Type 2 diabetes mellitus without complications: Secondary | ICD-10-CM

## 2010-09-01 DIAGNOSIS — Z23 Encounter for immunization: Secondary | ICD-10-CM

## 2010-09-01 MED ORDER — DICYCLOMINE HCL 20 MG PO TABS: 20.0000 mg | ORAL_TABLET | Freq: Three times a day (TID) | ORAL | Status: DC | PRN

## 2010-09-01 MED ORDER — TETANUS-DIPHTH-ACELL PERTUSSIS 5-2.5-18.5 LF-MCG/0.5 IM SUSP: 0.5000 mL | Freq: Once | INTRAMUSCULAR | Status: DC

## 2010-09-01 NOTE — Progress Notes (Signed)
Subjective:    Patient ID: [employer], female    DOB: Feb 12, 1940, 70 y.o.   MRN: [id number]  HPI  70 y/o WF here for a follow up visit... she has mult med problems as noted below...  ~  November 05, 2008:  she was last seen in office 6/09 & hosp x2 recently- 8/13-16 and 9/21-26/10 w/ ventral hernia containing incarcerated colon... had lap ventral hernia repair by [facility] on 09/22/08... she has improved but still weak, wt down 5#, feeling sl depressed & asking for Lexapro Rx... she's already had the 2010 flu vaccine...   ~  May 04, 2009:  74mo f/u & feeling better overall but still has some GI issues and needs ROV w/ DrDBrodie... hasn't been taking Prilosec & will write for OMEP40 daily before dinner... also c/o ear discomfort & exam shows 4+wax w/ prev disimpaction by [facility] & she will f/u w/ him as well... she denies CP, BP controlled on meds, due for f/u FLP & DM labs- she wants to start back on Actos she says...  ~  December 31, 2009:  40mo ROV- c/o sinus symptoms & we discussed OTC antihist, saline, Mucinex, & Flonase Qhs... she had f/u DrWeintraub 11/11 (HBP, ASHD, Hyperlipid, DM) w/ labs done> see below... she also had f/u DrDBrodie 6/11 w/ EGD showing antral gastritis (neg HPylori) & retained food- rx'd OMEP40 but she has since stopped this med; & Colonoscopy w/ 2 polyps & int hems- path= tub adenomas & f/u planned 35yrs... Fe normal at 93, B12 was borderline low at 250 (could be Metformin related) & she started B12 shots thru GI dept...  BP controlled on meds;  she will f/u FLP later;  recent A1c= 7.4 on Metform & Glimep- she refuses Januvia & Actos;  requesting refill Vicodin today.  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 & stopped her Simva40 at that time, he did some blood work & the pt  tells me that everything was normal x her BS=250 range (we don't have copies);  Since stopping the Simva40 she feels perhaps sl better; she is also c/o some loose stool/ diarrhea & she is on Metformin 1000mg  bid> asked to incr fiber intake for now & watch this; she also reiterated her concern over DrBrodie stopping her B12 shots> avail labs in EPIC shows that 1st B12 in 5/11 was 250 & DrBrodie started monthly B12 shots (see below); pt is concerned about stopping this Rx but she was never given a trial of oral medication> rec starting 1042mcg/d oral B12 supplement & we will f/u level...  We discussed the need for BMet (BS=207) & A1c (9.4) now for f/u of her DM & medication adjustment accordingly (rec> incr Glimep 4mg Bid & add Onglyza 5mg /d); she will try to get labs from [facility] for [place] to review; I will see her back here in 31mo for recheck of all this...  ~  September 01, 2010:  23mo ROV & ADD-ON FOR RASH> noticed painful spot RUQ area & she was concerned for shingles- exam shows min red over site if trocar insertion from prev lap surg, min tender, no rash, no blisters, no signs shingles, etc;  Min tender palp lower abd, not really over site of scar, incr BS> suggestive IBS-like  symptoms & we discussed fiber, moist heat, try BENTYL 20mg  Tid prn...    She reports stress fx left foot from walking (doing this for exercise to help her DM etc); given cream to use by [facility]endall at [facility]; she has Vicodin to use prn mod pain;  Hasn't been checking BS at home due to lancet device broken;  Requesting TDAP today & Rx for Shingles vaccine...   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 denies CP, palpit, syncope, edema, etc. ~  Myoview 12/08 showed perfusion defect w/ infarct/ scar & perinfarct ischemia... ~  subseq Cath 12/08 showed progressive CIRC dis & patent grafts- med Rx... ~  seen by [name]/ [facility]  9/10 w/ Myoview & 2DEcho- cleared for ventral hernia surg. ~  she continues regular f/u w/ [name], [facility]... his notes are reviewed.  HYPERTENSION (ICD-401.9) - on ATENOLOL 50mg /d, ALTACE 10mg /d, HCTZ 25mg /d... BP= 114/70 & taking meds regularly... denies HA, visual changes, CP, palipit, syncope, dyspnea, edema, etc...   VENOUS INSUFFICIENCY (ICD-459.81) - on low sodium diet, elevates legs, wear support hose when able...  HYPERLIPIDEMIA (ICD-272.4) - on SIMVASTATIN 40mg /d & NIASPAN 500mg /d... tolerates meds fair & takes her ASA prior to the Niacin Qhs... prev on Lip20 but stopped due to $$... ~  FLP 6/08 on Lip20 showed TChol 118, TG 155, HDL 41, LDL 46 ~  FLP 12/08 on Lip20 showed TChol 129, TG 201, HDL 37, LDL 56 ~  FLP 6/09 on Lip20 showed TChol 113, TG 162, HDL 34, LDL 47 ~  FLP 11/10 on Simva40+Niasp500 showed TChol 183, TG 347, HDL 49, LDL 72 ~  FLP 5/11 on Simva40+Niasp500 showed TChol 147, TG 307, HDL 40, LDL 49... rec better low fat diet.  DM (ICD-250.00) - on METFORMIN 1000mg Bid, & AMARYL 4mg /d... she refuses Januvia ($$), and Actos (worried about bladder cancer). ~  labs 6/08 showed BS 110, HgA1C 6.6 ~  labs 12/08 showed BS= 170, HgA1c= 7.2 ~  labs 6/09 showed BS= 210, A1c= 8.0.Marland Kitchen. rec> add Actos 15mg /d but pt never did due to $$. ~  labs 11/10 showed BS= 189, A1c= 7.6 ~  she refuses Januvia- "it made my sugar go higher" ~  5/11> she is asking to restart ACTOS 30mg /d because home BS= 150-200 range. ~  labs 5/11 showed BS= 162, A1c= 7.4... later refused Actos due to risk of bladder cancer. ~  labs 11/11 by [facility] showed BS= 117, A1c= 7.4.Marland KitchenMarland Kitchen rec to continue Metform & Glimep. ~  Labs 6/12 showed BS= 207, A1c= 9.4.Marland KitchenMarland Kitchen rec to continue Metform, incr Glimep4mg Bid & add Onglyza5mg /d.  GERD (ICD-530.81) - prev on Prilosec OTC Prn- rec ch to OMEPRAZOLE 40mg /d before dinner... ~  last EGD 12/06 by DrDBrodie showed mild antral gastritis, RUT neg, ?from ASA... Rx'd Pepcid 40mg /d. ~  5/11:  c/o  indigestion in eve> rec Omep40 before dinner & antireflux regimen... f/u w/ GI. ~  EGD 6/11 by DrDBrodie w/ antral gastritis & retained food, neg HPylori- Rx'd PPI.  COLONIC POLYPS (ICD-211.3) & Prob IBS >> ~  colonoscopy 12/06 by DrDBrodie showed 8mm polyp in sigmoid area= ?path not avail... ~  colonoscopy 6/11 showed 2 polyps & int hems, bx= tub adenomas & f/u planned 75yrs. ~  8/12:  Symptoms suggesting IBS & we discussed fiber, and trial BENTYL 20mg  Tid as needed for cramping pain...  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 post op oncology DrShinn w/ adjuvant chemotherapy- 5-FU/ Leucovorin x 26weeks finished 12/00... ~  she has been followed by DrDBrodie since DrShinn left [place]... ~  small bowel capsule endoscopy 1/07 was neg- no recurrent tumor seen, 2 tiny AVM's noted... ~  CT Abd/ Pelvis 8/10 w/ large ventral hernia containing transverse colon & mesentery- no obstruction, ?hep steatosis, lumbar DJD. ~  UGI & Sm Bowel Series 9/10 showed no HH or reflux seem, ventral hernia w/ transverse colon, no obstruction or mass in sm bowel. HERNIA, VENTRAL (ICD-553.20) ** see 9/10 Hosp>> lap ventral hernia repairw/ reduction incarcerated colon...  DEGENERATIVE JOINT DISEASE (ICD-715.90) - she is s/p left knee arthroscopy 24-May-1994... ~  labs 5/11 showed Vit D level = 43  HEADACHE (ICD-784.0) - hx mixed HA's in the past...  ANXIETY & DEPRESSION (ICD-311) - husb passed away in 05-24-02... she has CHLORAZEPATE 7.5mg  as needed... ~  11/10: pt requesting LEXAPRO 10mg /d... in f/u she notes improvement & decided to stop Rx.  SHINGLES, HX OF (ICD-V13.8)  ANEMIA, HX OF (ICD-V12.3) & Borderline VITAMIN B12 DEFICIENCY (ICD-266.2) ~  labs in hosp 9/10 showed Hg  down to 9.7 at disch... ~  labs 11/10 showed Hg= 11.8 ~  labs 5/11 showed Hg= 11.5, MCV= 93, Fe= 93 (24%sat), B12= 250 (DrDBrodie started B12 IM monthly. ~  labs 11/11 by [facility] showed Hg= 12.2, B12= 544 & DrBrodie continued the monthly shots. ~  Labs 05/24/2022 showed B12= 465 & pt indicates that DrBrodie stopped the B12 shots... ~  6/12:  I reviewed her chart & discussed w/ pt> we decided to Rx w/ OTC Women's Formula MVI AND Vit B12 1037mcg/d orally for now...   Past Surgical History  Procedure Date  . Cabg x 5 1997  . Cesarean section   . Left knee surgery   . Tonsillectomy   . Tubal ligation   . Resection of small bowel carcinoma 06/1998    Dr. Samuella Cota  . Lap ventral hernia repair with incarcerated colon 09/2008    Dr. Ezzard Standing    Outpatient Encounter Prescriptions as of 09/01/2010  Medication Sig Dispense Refill  . aspirin 81 MG tablet Take 81 mg by mouth daily.        Marland Kitchen atenolol (TENORMIN) 50 MG tablet TAKE 1 TABLET BY MOUTH DAILY  90 tablet  3  . cholecalciferol (VITAMIN D) 1000 UNITS tablet Take 1,000 Units by mouth daily.        . clorazepate (TRANXENE) 7.5 MG tablet Take 7.5 mg by mouth 3 (three) times daily.        . fluticasone (FLONASE) 50 MCG/ACT nasal spray Place 2 sprays into the nose daily.        Marland Kitchen glimepiride (AMARYL) 4 MG tablet Take 1 tablet (4 mg total) by mouth 2 (two) times daily.  60 tablet  11  . hydrochlorothiazide 25 MG tablet TAKE 1 TABLET BY MOUTH ONCE DAILY  90 tablet  3  . HYDROcodone-acetaminophen (VICODIN) 5-500 MG per tablet Take 1 tablet by mouth every 6 (six) hours as needed for pain (do not exceed 3 tablets per day).  90 tablet  5  . metFORMIN (GLUCOPHAGE) 1000 MG tablet TAKE 1 TABLET BY MOUTH TWICE DAILY  180 tablet  3  . Multiple Vitamin (MULTIVITAMIN) capsule Take 1 capsule  by mouth daily.        Marland Kitchen NIASPAN 500 MG CR tablet TAKE 1 TABLET BY MOUTH DAILY  90 tablet  3  . PLAVIX 75 MG tablet TAKE 1 TABLET BY MOUTH DAILY  90 tablet  3  . ramipril  (ALTACE) 10 MG capsule TAKE ONE CAPSULE BY MOUTH DAILY  90 capsule  3  . saxagliptin HCl (ONGLYZA) 5 MG TABS tablet Take 1 tablet (5 mg total) by mouth daily.  30 tablet  11  . vitamin B-12 (CYANOCOBALAMIN) 1000 MCG tablet Take 1,000 mcg by mouth daily.        . simvastatin (ZOCOR) 40 MG tablet Take 40 mg by mouth at bedtime. ON HOLD SINCE JUNE        Allergies  Allergen Reactions  . Codeine     REACTION: makes her nervous  . Ibuprofen     REACTION: nervous  . Meperidine Hcl     REACTION: nasuea and vomitting  . Naproxen Sodium     REACTION: nervous    Review of Systems       See HPI - all other systems neg except as noted...      The patient complains of depression.  The patient denies anorexia, fever, weight loss, weight gain, vision loss, decreased hearing, hoarseness, chest pain, syncope, dyspnea on exertion, peripheral edema, prolonged cough, headaches, hemoptysis, abdominal pain, melena, hematochezia, severe indigestion/heartburn, hematuria, incontinence, muscle weakness, suspicious skin lesions, transient blindness, difficulty walking, unusual weight change, abnormal bleeding, enlarged lymph nodes, and angioedema.     Objective:   Physical Exam      WD, WN, 70 y/o WF in NAD... GENERAL:  Alert & oriented; pleasant & cooperative... HEENT:  [place]/AT, EOM-wnl, PERRLA, EACs- sl red w/ cerumen; NOSE-clear, THROAT-mild redness, no exudate NECK:  Supple w/ adeq ROM; no JVD; normal carotid impulses w/o bruits; no thyromegaly or nodules palpated; no lymphadenopathy. CHEST:  Clear to P & A; without wheezes/ rales/ or rhonchi heard... HEART:  Regular Rhythm; without murmurs/ rubs/ or gallops detected... ABDOMEN: scar of prev surg, abd panniculus, normal bowel sounds; min LLQ discomfort, no organomegaly or masses palpated... EXT: mild dupytren's scarring in both palms, no obvious arthritic changes, no varicose veins/ venous insuffic/ or edema. NEURO:  CN's intact; motor testing normal;  sensory testing normal; gait normal & balance OK. DERM:  No lesions noted; min red at RUQ site of prev trocar insertion for laparoscopic surg- no blister, no rash, not tender...   Assessment & Plan:   "Rash" & Abd discomfort at site of remote laparoscopic trocar insertion RUQ- really quite min symptom & no signif rash- rec to treat w/ topical moist heat & use Vicodin as needed;  She has signs & symptoms suggesting IBS> discussed fiber & trial BENTYL 20mg  Tid prn...  DM>  Poorly controlled & we reviewed diet, exercise, & wt reduction;  A1c up to 9.5 & she is warned that Insulin is her next step> for now continue Metformin 1000mg Bid, incr Glimepiride to 4mg Bid, ands add ONGLYZA 5mg /d ==> she will get new Lancet device from her home delivery service.  ASHD>  Followed by [name] & his 6/12 note is reviewed... He plans f/u 2DEcho & Stress test...  HBP>  Controlled on meds, continue same, get wt down some...  CHOL>  [name] held her Statin Rx (Simva40) due to weakness, she says sl better off this med, & she will f/u w/ him & his suggestions...  GI>  GERD, Colon Polyps>  On  Omep40mg /d & followed by DrDBrodie...  DJD>  She uses Vicodin as needed...  Anxiety>  She takes Tranxene as needed...  Anemia>  We reviewed her prev B12 labs & decided to start her on oral B12 supplement ~1041mcg/d... We will f/u labs later.

## 2010-09-01 NOTE — Patient Instructions (Signed)
Today we updated your med list in EPIC...  The painful area on your abd is NOT shingles, it is right over the site of prev instrumentation w/ your laparoscopic surg and likely related to inflammation> REC heat to the area, and use your VICODIN pain pill as needed...  You may also use the new BENTYL (Dicyclomine) up to three times daily as needed for abd cramping...  We gave you the TDAP today (combination Tetanus vaccine- good for 10 yrs)...  We wrote you a prescription for the Shingles vaccine you may fill at any area Pharmacy shot clinic...  Call for any questions...  Let's keep our planned f/u in October.Marland KitchenMarland Kitchen

## 2010-10-03 ENCOUNTER — Ambulatory Visit (INDEPENDENT_AMBULATORY_CARE_PROVIDER_SITE_OTHER): Payer: Medicare Other | Admitting: Pulmonary Disease

## 2010-10-03 ENCOUNTER — Encounter: Payer: Self-pay | Admitting: Pulmonary Disease

## 2010-10-03 ENCOUNTER — Other Ambulatory Visit (INDEPENDENT_AMBULATORY_CARE_PROVIDER_SITE_OTHER): Payer: Medicare Other

## 2010-10-03 DIAGNOSIS — I2510 Atherosclerotic heart disease of native coronary artery without angina pectoris: Secondary | ICD-10-CM

## 2010-10-03 DIAGNOSIS — F329 Major depressive disorder, single episode, unspecified: Secondary | ICD-10-CM | POA: Insufficient documentation

## 2010-10-03 DIAGNOSIS — I10 Essential (primary) hypertension: Secondary | ICD-10-CM

## 2010-10-03 DIAGNOSIS — E119 Type 2 diabetes mellitus without complications: Secondary | ICD-10-CM

## 2010-10-03 DIAGNOSIS — Z23 Encounter for immunization: Secondary | ICD-10-CM

## 2010-10-03 DIAGNOSIS — K219 Gastro-esophageal reflux disease without esophagitis: Secondary | ICD-10-CM

## 2010-10-03 DIAGNOSIS — F419 Anxiety disorder, unspecified: Secondary | ICD-10-CM | POA: Insufficient documentation

## 2010-10-03 DIAGNOSIS — M1990 Unspecified osteoarthritis, unspecified site: Secondary | ICD-10-CM

## 2010-10-03 DIAGNOSIS — E785 Hyperlipidemia, unspecified: Secondary | ICD-10-CM

## 2010-10-03 DIAGNOSIS — F341 Dysthymic disorder: Secondary | ICD-10-CM

## 2010-10-03 LAB — BASIC METABOLIC PANEL
BUN: 27 mg/dL — ABNORMAL HIGH (ref 6–23)
GFR: 48.94 mL/min — ABNORMAL LOW (ref >60.00)
Potassium: 4.5 mEq/L (ref 3.5–5.1)
Sodium: 140 mEq/L (ref 135–145)

## 2010-10-03 LAB — HEMOGLOBIN A1C: Hgb A1c MFr Bld: 7.6 % — ABNORMAL HIGH (ref 4.6–6.5)

## 2010-10-03 MED ORDER — HYDROCODONE-ACETAMINOPHEN 5-500 MG PO TABS: 1.0000 | ORAL_TABLET | Freq: Four times a day (QID) | ORAL | Status: DC | PRN

## 2010-10-03 NOTE — Patient Instructions (Signed)
Today we updated your med list in EPIC...    We refilled your Vicodin per request...  Today we did your follow up fasting blood work...    Please call the PHONE TREE in a few days for your results...    Dial [id number] & when prompted enter your patient number followed by the # symbol...    Your patient number is:  [id number]#  We gave you the 2012 FLU vaccine today & wrote another prescription for the Shingles shot...  Call for any questions...  Let's plan another follow up visit after the holidays in about 4 months w/ FASTING blood work at that time.

## 2010-10-04 ENCOUNTER — Encounter: Payer: Self-pay | Admitting: Pulmonary Disease

## 2010-10-04 NOTE — Progress Notes (Signed)
Subjective:    Patient ID: [employer], female    DOB: [birth year], 70 y.o.   MRN: [id number]  HPI 70 y/o WF here for a follow up visit... she has mult med problems as noted below...  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 & stopped her Simva40 at that time, he did some blood work & the pt tells me that everything was normal x her BS=250 range (we don't have copies);  Since stopping the Simva40 she feels perhaps sl better; she is also c/o some loose stool/ diarrhea & she is on Metformin 1000mg  bid> asked to incr fiber intake for now & watch this; she also reiterated her concern over DrBrodie stopping her B12 shots> avail labs in EPIC shows that 1st B12 in 5/11 was 250 & DrBrodie started monthly B12 shots (see below); pt is concerned about stopping this Rx but she was never given a trial of oral medication> rec starting 1032mcg/d oral B12 supplement & we will f/u level...  We discussed the need for BMet (BS=207) & A1c (9.4) now for f/u of her DM & medication adjustment accordingly (rec> incr Glimep 4mg Bid & add Onglyza 5mg /d); she will try to get labs from [facility] for [place] to review; I will see her back here in 8mo for recheck of all this...  ~  September 01, 2010:  78mo ROV & ADD-ON FOR RASH> noticed painful spot RUQ area & she was concerned for shingles- exam shows min red over site if trocar insertion from prev lap surg, min tender, no rash, no blisters, no signs shingles, etc;  Min tender palp lower abd, not really over site of scar, incr BS> suggestive IBS-like symptoms & we discussed fiber, moist heat, try BENTYL 20mg  Tid prn...    She reports stress fx left foot from walking (doing this for exercise to help her DM etc); given cream to use by DrKendall at [facility]; she has Vicodin to use prn mod pain;  Hasn't  been checking BS at home due to lancet device broken;  Requesting TDAP today & Rx for Shingles vaccine...  ~  October 03, 2010:  21mo ROV & she reports that IBS symptoms improved w/ Bentyl prn; here for 8mo f/u from June visit when BS=207 & A1c=9.4 (we incr Glimep4 to Bid & added Ongly5); labs today show BS=145 & A1c=7.6, improved & we will continue same meds, better diet, get wt down!!!  OK FLU vaccine today...   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 denies CP, palpit, syncope, edema, etc. ~  Myoview 12/08 showed perfusion defect w/ infarct/ scar & perinfarct ischemia... ~  subseq Cath 12/08 showed progressive CIRC dis & patent grafts- med Rx... ~  seen by [name]/ [facility] 9/10 w/ Myoview & 2DEcho- cleared for ventral hernia surg. ~  she continues regular f/u w/ [name], [facility]... his notes are reviewed.  HYPERTENSION (ICD-401.9) - on ATENOLOL 50mg /d, ALTACE 10mg /d, HCTZ 25mg /d... BP= 114/70 & taking meds regularly... denies HA, visual changes, CP, palipit, syncope, dyspnea, edema, etc...   VENOUS INSUFFICIENCY (ICD-459.81) - on low sodium diet, elevates legs, wear support hose when able...  HYPERLIPIDEMIA (ICD-272.4) - on SIMVASTATIN  40mg /d & NIASPAN 500mg /d> takes her ASA prior to the Niacin Qhs; prev on Lip20 but stopped due to $$... ~  FLP 6/08 on Lip20 showed TChol 118, TG 155, HDL 41, LDL 46 ~  FLP 12/08 on Lip20 showed TChol 129, TG 201, HDL 37, LDL 56 ~  FLP 6/09 on Lip20 showed TChol 113, TG 162, HDL 34, LDL 47 ~  FLP 11/10 on Simva40+Niasp500 showed TChol 183, TG 347, HDL 49, LDL 72 ~  FLP 5/11 on Simva40+Niasp500 showed TChol 147, TG 307, HDL 40, LDL 49... rec better low fat diet. ~  6/12:  She tells me that [name] stopped her Simva40 due to ?myopathy; she states feeling min better off statin.  DM (ICD-250.00) - on METFORMIN 1000mg Bid, & AMARYL 4mg /d... she refuses  Januvia ($$), and Actos (worried about bladder cancer). ~  labs 6/08 showed BS 110, HgA1C 6.6 ~  labs 12/08 showed BS= 170, HgA1c= 7.2 ~  labs 6/09 showed BS= 210, A1c= 8.0.Marland Kitchen. rec> add Actos 15mg /d but pt never did due to $$. ~  labs 11/10 showed BS= 189, A1c= 7.6 ~  she refuses Januvia- "it made my sugar go higher" ~  5/11> she is asking to restart ACTOS 30mg /d because home BS= 150-200 range. ~  labs 5/11 showed BS= 162, A1c= 7.4... later refused Actos due to risk of bladder cancer. ~  labs 11/11 by [facility] showed BS= 117, A1c= 7.4.Marland KitchenMarland Kitchen rec to continue Metform & Glimep. ~  Labs 6/12 showed BS= 207, A1c= 9.4.Marland KitchenMarland Kitchen rec to continue Metform, incr Glimep4mg Bid & add Onglyza5mg /d. ~  Labs 10/12 showed BS= 145, A1c= 7.6.Marland KitchenMarland Kitchen Continue same, get wt down...  GERD (ICD-530.81) - prev on Prilosec OTC Prn- rec ch to OMEPRAZOLE 40mg /d before dinner... ~  last EGD 12/06 by DrDBrodie showed mild antral gastritis, RUT neg, ?from ASA... Rx'd Pepcid 40mg /d. ~  5/11:  c/o indigestion in eve> rec Omep40 before dinner & antireflux regimen... f/u w/ GI. ~  EGD 6/11 by DrDBrodie w/ antral gastritis & retained food, neg HPylori- Rx'd PPI.  COLONIC POLYPS (ICD-211.3) & Prob IBS >> ~  colonoscopy 12/06 by DrDBrodie showed 8mm polyp in sigmoid area= ?path not avail... ~  colonoscopy 6/11 showed 2 polyps & int hems, bx= tub adenomas & f/u planned 81yrs. ~  8/12:  Symptoms suggesting IBS & we discussed fiber, and trial BENTYL 20mg  Tid as needed for cramping pain...  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 post op oncology DrShinn w/ adjuvant chemotherapy- 5-FU/ Leucovorin x 26weeks finished 12/00... ~  she has been followed by DrDBrodie since DrShinn left [place]... ~  small  bowel capsule endoscopy 1/07 was neg- no recurrent tumor seen, 2 tiny AVM's noted... ~  CT Abd/ Pelvis 8/10 w/ large ventral hernia containing transverse colon & mesentery- no obstruction, ?hep steatosis, lumbar DJD. ~  UGI & Sm Bowel Series 9/10 showed no HH or reflux seem, ventral hernia w/ transverse colon, no obstruction or mass in sm bowel. HERNIA, VENTRAL (ICD-553.20) << see 9/10 Hosp >> lap ventral hernia repair w/ reduction incarcerated colon by DrNewman.  DEGENERATIVE JOINT DISEASE (ICD-715.90) - she is s/p left knee arthroscopy 1996... ~  labs 5/11 showed Vit D level = 43  HEADACHE (ICD-784.0) - hx mixed HA's in the past...  ANXIETY &  DEPRESSION (ICD-311) - husb passed away in 2002-06-01... she has CHLORAZEPATE 7.5mg  as needed... ~  11/10: pt requesting LEXAPRO 10mg /d... in f/u she notes improvement & decided to stop Rx.  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 ~  labs 11/10 showed Hg= 11.8 ~  labs 5/11 showed Hg= 11.5, MCV= 93, Fe= 93 (24%sat), B12= 250 (DrDBrodie started B12 IM monthly. ~  labs 11/11 by [facility] showed Hg= 12.2, B12= 544 & DrBrodie continued the monthly shots. ~  Labs 06-01-22 showed B12= 465 & pt indicates that DrBrodie stopped the B12 shots... ~  6/12:  I reviewed her chart & discussed w/ pt> we decided to Rx w/ OTC Women's Formula MVI AND Vit B12 1054mcg/d orally for now...   Past Surgical History  Procedure Date  . Cabg x 5 1997  . Cesarean section   . Left knee surgery   . Tonsillectomy   . Tubal ligation   . Resection of small bowel carcinoma 06/1998    Dr. Samuella Cota  . Lap ventral hernia repair with incarcerated colon 09/2008    Dr. Ezzard Standing    Outpatient Encounter Prescriptions as of 10/03/2010  Medication Sig Dispense Refill  . aspirin 81 MG tablet Take 81 mg by mouth daily.        Marland Kitchen atenolol (TENORMIN) 50 MG tablet TAKE 1 TABLET BY MOUTH  DAILY  90 tablet  3  . cholecalciferol (VITAMIN D) 1000 UNITS tablet Take 1,000 Units by mouth daily.        . clorazepate (TRANXENE) 7.5 MG tablet Take 7.5 mg by mouth 3 (three) times daily.        Marland Kitchen dicyclomine (BENTYL) 20 MG tablet Take 1 tablet (20 mg total) by mouth 3 (three) times daily as needed (for abdominal cramping).  50 tablet  5  . fluticasone (FLONASE) 50 MCG/ACT nasal spray Place 2 sprays into the nose daily.        Marland Kitchen glimepiride (AMARYL) 4 MG tablet Take 1 tablet (4 mg total) by mouth 2 (two) times daily.  60 tablet  11  . hydrochlorothiazide 25 MG tablet TAKE 1 TABLET BY MOUTH ONCE DAILY  90 tablet  3  . HYDROcodone-acetaminophen (VICODIN) 5-500 MG per tablet Take 1 tablet by mouth every 6 (six) hours as needed for pain (do not exceed 3 tablets per day).  90 tablet  5  . metFORMIN (GLUCOPHAGE) 1000 MG tablet TAKE 1 TABLET BY MOUTH TWICE DAILY  180 tablet  3  . Multiple Vitamin (MULTIVITAMIN) capsule Take 1 capsule by mouth daily.        Marland Kitchen NIASPAN 500 MG CR tablet TAKE 1 TABLET BY MOUTH DAILY  90 tablet  3  . PLAVIX 75 MG tablet TAKE 1 TABLET BY MOUTH DAILY  90 tablet  3  . ramipril (ALTACE) 10 MG capsule TAKE ONE CAPSULE BY MOUTH DAILY  90 capsule  3  . saxagliptin HCl (ONGLYZA) 5 MG TABS tablet Take 1 tablet (5 mg total) by mouth daily.  30 tablet  11  . vitamin B-12 (CYANOCOBALAMIN) 1000 MCG tablet Take 1,000 mcg by mouth daily.        Marland Kitchen DISCONTD: HYDROcodone-acetaminophen (VICODIN) 5-500 MG per tablet Take 1 tablet by mouth every 6 (six) hours as needed for pain (do not exceed 3 tablets per day).  90 tablet  5  . simvastatin (ZOCOR) 40  MG tablet Take 40 mg by mouth at bedtime. ON HOLD SINCE JUNE       Facility-Administered Encounter Medications as of 10/03/2010  Medication Dose Route Frequency Provider Last Rate Last Dose  . TDaP (BOOSTRIX) injection 0.5 mL  0.5 mL Intramuscular Once Michele Mcalpine, MD        Allergies  Allergen Reactions  . Codeine     REACTION: makes her  nervous  . Ibuprofen     REACTION: nervous  . Meperidine Hcl     REACTION: nasuea and vomitting  . Naproxen Sodium     REACTION: nervous    Review of Systems       See HPI - all other systems neg except as noted...      The patient complains of depression.  The patient denies anorexia, fever, weight loss, weight gain, vision loss, decreased hearing, hoarseness, chest pain, syncope, dyspnea on exertion, peripheral edema, prolonged cough, headaches, hemoptysis, abdominal pain, melena, hematochezia, severe indigestion/heartburn, hematuria, incontinence, muscle weakness, suspicious skin lesions, transient blindness, difficulty walking, unusual weight change, abnormal bleeding, enlarged lymph nodes, and angioedema.     Objective:   Physical Exam      WD, WN, 70 y/o WF in NAD... GENERAL:  Alert & oriented; pleasant & cooperative... HEENT:  [place]/AT, EOM-wnl, PERRLA, EACs- sl red w/ cerumen; NOSE-clear, THROAT-mild redness, no exudate NECK:  Supple w/ adeq ROM; no JVD; normal carotid impulses w/o bruits; no thyromegaly or nodules palpated; no lymphadenopathy. CHEST:  Clear to P & A; without wheezes/ rales/ or rhonchi heard... HEART:  Regular Rhythm; without murmurs/ rubs/ or gallops detected... ABDOMEN: scar of prev surg, abd panniculus, normal bowel sounds; min LLQ discomfort, no organomegaly or masses palpated... EXT: mild dupytren's scarring in both palms, no obvious arthritic changes, no varicose veins/ venous insuffic/ or edema. NEURO:  CN's intact; motor testing normal; sensory testing normal; gait normal & balance OK. DERM:  No lesions noted; min red at RUQ site of prev trocar insertion for laparoscopic surg- no blister, no rash, not tender...   Assessment & Plan:   DM>  Now on Metform1000Bid, Glimep4mg Bid & Onglyza5mg  (she won't buy it, uses samples); A1c improved to 7.6 and we will continue same Rx, get on diet, get wt down!!!   ASHD>  Followed by [name] & his 6/12 note is reviewed...  He plans f/u 2DEcho & Stress test...  HBP>  Controlled on meds, continue same, get wt down some...  CHOL>  [name] held her Statin Rx (Simva40) due to weakness, she says sl better off this med, & she will f/u w/ him & his suggestions...  GI>  GERD, Colon Polyps, Hx small bowel cancer 2000, s/p ventral hernia repair 2010>  On Omep40mg /d and BENTYL 20mg  Prn & followed by DrDBrodie...  DJD>  She uses Vicodin as needed...  Anxiety>  She takes Tranxene as needed...  Anemia>  We reviewed her prev B12 labs & decided to start her on oral B12 supplement ~1093mcg/d... We will f/u labs later.

## 2010-10-10 ENCOUNTER — Encounter: Payer: Self-pay | Admitting: Pulmonary Disease

## 2010-10-27 ENCOUNTER — Other Ambulatory Visit: Payer: Self-pay | Admitting: Pulmonary Disease

## 2010-10-27 DIAGNOSIS — Z1231 Encounter for screening mammogram for malignant neoplasm of breast: Secondary | ICD-10-CM

## 2010-11-14 ENCOUNTER — Telehealth: Payer: Self-pay | Admitting: *Deleted

## 2010-11-14 NOTE — Telephone Encounter (Signed)
Left a message for patient that she is due for labs on 11/18/10

## 2010-11-14 NOTE — Telephone Encounter (Signed)
Message copied by Daphine Deutscher on Mon Nov 14, 2010  8:38 AM ------      Message from: Daphine Deutscher      Created: Wed May 18, 2010  2:03 PM       Call and remind patient due for Vit B 12 level(Brodie)on 11/18/10

## 2010-11-16 ENCOUNTER — Ambulatory Visit
Admission: RE | Admit: 2010-11-16 | Discharge: 2010-11-16 | Disposition: A | Discharge status: Home / Self Care | Payer: Medicare Other | Source: Ambulatory Visit | Attending: Pulmonary Disease | Admitting: Pulmonary Disease

## 2010-11-16 DIAGNOSIS — Z1231 Encounter for screening mammogram for malignant neoplasm of breast: Secondary | ICD-10-CM

## 2010-11-29 ENCOUNTER — Telehealth: Payer: Self-pay | Admitting: *Deleted

## 2010-11-29 NOTE — Telephone Encounter (Signed)
Message copied by Richardson Chiquito on Tue Nov 29, 2010  9:02 AM ------      Message from: Daphine Deutscher      Created: Mon Nov 14, 2010  8:40 AM       Did patient have labs on 11/18/10(b12 for DB)

## 2010-11-29 NOTE — Telephone Encounter (Signed)
OK , I will discuss it at her next visit.Marland Kitchen Please let Dr Kriste Basque know as well.

## 2010-11-29 NOTE — Telephone Encounter (Signed)
I have called and spoken to patient to advise her that she is due to have b12 level checked. She states that she does not need to have her level checked because the b12 shots "did nothing" for her. I advised her that we still need to see if her levels have remained stable. She refuses to take any more b12 injections even if she is b12 deficient and does not wish to come for labwork. I will make Dr Juanda Chance aware.

## 2011-01-31 DIAGNOSIS — R0989 Other specified symptoms and signs involving the circulatory and respiratory systems: Secondary | ICD-10-CM | POA: Diagnosis not present

## 2011-02-06 ENCOUNTER — Encounter: Payer: Self-pay | Admitting: Pulmonary Disease

## 2011-02-06 ENCOUNTER — Other Ambulatory Visit (INDEPENDENT_AMBULATORY_CARE_PROVIDER_SITE_OTHER): Payer: Medicare Other

## 2011-02-06 ENCOUNTER — Ambulatory Visit (INDEPENDENT_AMBULATORY_CARE_PROVIDER_SITE_OTHER): Payer: Medicare Other | Admitting: Pulmonary Disease

## 2011-02-06 DIAGNOSIS — F341 Dysthymic disorder: Secondary | ICD-10-CM

## 2011-02-06 DIAGNOSIS — I2510 Atherosclerotic heart disease of native coronary artery without angina pectoris: Secondary | ICD-10-CM | POA: Diagnosis not present

## 2011-02-06 DIAGNOSIS — E538 Deficiency of other specified B group vitamins: Secondary | ICD-10-CM | POA: Diagnosis not present

## 2011-02-06 DIAGNOSIS — M1990 Unspecified osteoarthritis, unspecified site: Secondary | ICD-10-CM

## 2011-02-06 DIAGNOSIS — F411 Generalized anxiety disorder: Secondary | ICD-10-CM | POA: Diagnosis not present

## 2011-02-06 DIAGNOSIS — E785 Hyperlipidemia, unspecified: Secondary | ICD-10-CM

## 2011-02-06 DIAGNOSIS — I10 Essential (primary) hypertension: Secondary | ICD-10-CM | POA: Diagnosis not present

## 2011-02-06 DIAGNOSIS — E119 Type 2 diabetes mellitus without complications: Secondary | ICD-10-CM

## 2011-02-06 DIAGNOSIS — I872 Venous insufficiency (chronic) (peripheral): Secondary | ICD-10-CM

## 2011-02-06 DIAGNOSIS — E559 Vitamin D deficiency, unspecified: Secondary | ICD-10-CM | POA: Diagnosis not present

## 2011-02-06 DIAGNOSIS — F419 Anxiety disorder, unspecified: Secondary | ICD-10-CM

## 2011-02-06 DIAGNOSIS — Z862 Personal history of diseases of the blood and blood-forming organs and certain disorders involving the immune mechanism: Secondary | ICD-10-CM | POA: Diagnosis not present

## 2011-02-06 DIAGNOSIS — K219 Gastro-esophageal reflux disease without esophagitis: Secondary | ICD-10-CM

## 2011-02-06 LAB — CBC WITH DIFFERENTIAL/PLATELET
Basophils Relative: 0.5 % (ref 0.0–3.0)
Eosinophils Absolute: 0.1 10*3/uL (ref 0.0–0.7)
Eosinophils Relative: 0.7 % (ref 0.0–5.0)
HCT: 37.8 % (ref 36.0–46.0)
Hemoglobin: 13 g/dL (ref 12.0–15.0)
Lymphs Abs: 4.3 10*3/uL — ABNORMAL HIGH (ref 0.7–4.0)
MCHC: 34.4 g/dL (ref 30.0–36.0)
MCV: 93.4 fl (ref 78.0–100.0)
Monocytes Absolute: 0.7 10*3/uL (ref 0.1–1.0)
Neutro Abs: 5.9 10*3/uL (ref 1.4–7.7)
RBC: 4.05 Mil/uL (ref 3.87–5.11)

## 2011-02-06 LAB — HEPATIC FUNCTION PANEL
ALT: 17 U/L (ref 0–35)
Bilirubin, Direct: 0.1 mg/dL (ref 0.0–0.3)
Total Bilirubin: 0.6 mg/dL (ref 0.3–1.2)

## 2011-02-06 LAB — LIPID PANEL
Cholesterol: 122 mg/dL (ref 0–200)
HDL: 51.3 mg/dL (ref >39.00)
Total CHOL/HDL Ratio: 2
VLDL: 45.4 mg/dL — ABNORMAL HIGH (ref 0.0–40.0)

## 2011-02-06 LAB — BASIC METABOLIC PANEL
Calcium: 9.9 mg/dL (ref 8.4–10.5)
Chloride: 107 mEq/L (ref 96–112)
Creatinine, Ser: 1.2 mg/dL (ref 0.4–1.2)
GFR: 47.47 mL/min — ABNORMAL LOW (ref >60.00)

## 2011-02-06 NOTE — Patient Instructions (Signed)
Today we updated your med list in our EPIC system...    Continue your current medications the same...  Today we did your follow up fasting blood work...    Please call the PHONE TREE in a few days for your results...    Dial [id number] & when prompted enter your patient number followed by the # symbol...    Your patient number is:  [id number]#  Stay as active as possible...  Call for any questions...  Let's plan a follow up visit in 6months.Marland KitchenMarland Kitchen

## 2011-02-06 NOTE — Progress Notes (Signed)
Subjective:    Patient ID: [employer], female    DOB: [birth year], 71 y.o.   MRN: [id number]  HPI 71 y/o WF here for a follow up visit... she has mult med problems as noted below...  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 & stopped her Simva40 at that time, he did some blood work & the pt tells me that everything was normal x her BS=250 range (we don't have copies);  Since stopping the Simva40 she feels perhaps sl better; she is also c/o some loose stool/ diarrhea & she is on Metformin 1000mg  bid> asked to incr fiber intake for now & watch this; she also reiterated her concern over DrBrodie stopping her B12 shots> avail labs in EPIC shows that 1st B12 in 5/11 was 250 & DrBrodie started monthly B12 shots (see below); pt is concerned about stopping this Rx but she was never given a trial of oral medication> rec starting 1072mcg/d oral B12 supplement & we will f/u level...  We discussed the need for BMet (BS=207) & A1c (9.4) now for f/u of her DM & medication adjustment accordingly (rec> incr Glimep 4mg Bid & add Onglyza 5mg /d); she will try to get labs from [facility] for [place] to review; I will see her back here in 59mo for recheck of all this...  ~  September 01, 2010:  29mo ROV & ADD-ON FOR RASH> noticed painful spot RUQ area & she was concerned for shingles- exam shows min red over site of trocar insertion from prev lap surg, min tender, no rash, no blisters, no signs shingles, etc;  Min tender palp lower abd, not really over site of scar, incr BS> suggestive IBS-like symptoms & we discussed fiber, moist heat, try BENTYL 20mg  Tid prn...    She reports stress fx left foot from walking (doing this for exercise to help her DM etc); given cream to use by DrKendall at [facility]; she has Vicodin to use prn mod pain;  Hasn't  been checking BS at home due to lancet device broken;  Requesting TDAP today & Rx for Shingles vaccine...  ~  October 03, 2010:  90mo ROV & she reports that IBS symptoms improved w/ Bentyl prn; here for 59mo f/u from June visit when BS=207 & A1c=9.4 (we incr Glimep4 to Bid & added Ongly5); labs today show BS=145 & A1c=7.6, improved & we will continue same meds, better diet, get wt down!!!  OK FLU vaccine today...  ~  February 06, 2011:  24mo ROV & she reports feeling well and "doing fine"> here for f/u blood work (BS171, A1c7.6)... See prob list below>>    She saw [name] 12/12> he had stopped her Simva40 last yr & reported dramatic improvement off this med, so he started Cres10 in it's place & tol well so far;  He was also planning CDopplers at her request to allay her anxiety about a friend w/ Carotid dis... 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 denies CP, palpit, syncope, edema, etc. ~  Myoview 12/08 showed perfusion defect w/ infarct/ scar & perinfarct ischemia... ~  subseq Cath 12/08 showed progressive CIRC dis & patent grafts- med Rx... ~  seen by [name]/ [facility] 9/10 w/ Myoview & 2DEcho- cleared for ventral hernia surg. ~  she continues regular f/u w/ [name], [facility]... his notes are reviewed.  HYPERTENSION (ICD-401.9) - on ATENOLOL 50mg /d, ALTACE 10mg /d, HCTZ 25mg /d... BP= 140/76 & taking meds regularly... denies HA, visual changes, CP, palipit, syncope, dyspnea, edema, etc...  ~  Last CXR 9/10 showed prev CABG, normal heart size, clear lungs...  VENOUS INSUFFICIENCY (ICD-459.81) - on low sodium diet, elevates legs, wear support hose when able...  HYPERLIPIDEMIA (ICD-272.4) - now on CRESTOR 10mg /d & NIASPAN  500mg /d> takes her ASA prior to the Niacin Qhs; prev on Lip20 (stopped due to $$) & Simva40 (stopped due to myalgias)... ~  FLP 6/08 on Lip20 showed TChol 118, TG 155, HDL 41, LDL 46 ~  FLP 12/08 on Lip20 showed TChol 129, TG 201, HDL 37, LDL 56 ~  FLP 6/09 on Lip20 showed TChol 113, TG 162, HDL 34, LDL 47 ~  FLP 11/10 on Simva40+Niasp500 showed TChol 183, TG 347, HDL 49, LDL 72 ~  FLP 5/11 on Simva40+Niasp500 showed TChol 147, TG 307, HDL 40, LDL 49... rec better low fat diet. ~  6/12:  She tells me that [name] stopped her Simva40 due to ?myopathy; she states feeling better off statin. ~  FLP 2/13 on Cres10+Niasp500 showed TChol 122, TG 227, HDL 51, LDL 40... Needs better low fat diet.  DM (ICD-250.00) - on METFORMIN 1000mg Bid, AMARYL 4mg /d, & ONGL?YZA 5mg /d... she refuses Januvia ($$), and Actos (worried about bladder cancer). ~  labs 6/08 showed BS 110, HgA1C 6.6 ~  labs 12/08 showed BS= 170, HgA1c= 7.2 ~  labs 6/09 showed BS= 210, A1c= 8.0.Marland Kitchen. rec> add Actos 15mg /d but pt never did due to $$. ~  labs 11/10 showed BS= 189, A1c= 7.6 ~  she refuses Januvia- "it made my sugar go higher" ~  5/11> she is asking to restart ACTOS 30mg /d because home BS= 150-200 range. ~  labs 5/11 showed BS= 162, A1c= 7.4... later refused Actos due to risk of bladder cancer. ~  labs 11/11 by [facility] showed BS= 117, A1c= 7.4.Marland KitchenMarland Kitchen rec to continue Metform & Glimep. ~  Labs 6/12 showed BS= 207, A1c= 9.4.Marland KitchenMarland Kitchen rec to continue Metform, incr Glimep4mg Bid & add Onglyza5mg /d. ~  Labs 10/12 showed BS= 145, A1c= 7.6.Marland KitchenMarland Kitchen Continue same, get wt down... ~  Labs 2/13showed BS= 171, A1c= 7.6.Marland KitchenMarland Kitchen Continue same, get wt down.  GERD (ICD-530.81) - prev on Prilosec OTC Prn- rec ch to OMEPRAZOLE 40mg /d before dinner... ~  last EGD 12/06 by DrDBrodie showed mild antral gastritis, RUT neg, ?from ASA... Rx'd Pepcid 40mg /d. ~  5/11:  c/o indigestion in eve> rec Omep40 before dinner & antireflux regimen... f/u w/ GI. ~  EGD 6/11 by DrDBrodie w/ antral  gastritis & retained food, neg HPylori- Rx'd PPI.  COLONIC POLYPS (ICD-211.3) & Prob IBS >> ~  colonoscopy 12/06 by DrDBrodie showed 8mm polyp in sigmoid area= ?path not avail... ~  colonoscopy 6/11 showed 2 polyps & int hems, bx= tub adenomas & f/u planned 69yrs. ~  8/12:  Symptoms suggesting IBS & we discussed fiber, and trial BENTYL 20mg  Tid as needed for cramping pain...  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 post op oncology DrShinn w/ adjuvant chemotherapy- 5-FU/ Leucovorin x 26weeks finished 12/00... ~  she has been followed by DrDBrodie since DrShinn left [place]... ~  small bowel capsule endoscopy 1/07 was neg- no recurrent tumor seen, 2 tiny AVM's noted... ~  CT Abd/ Pelvis 8/10 w/ large ventral hernia containing transverse colon & mesentery- no obstruction, ?hep steatosis, lumbar DJD. ~  UGI & Sm Bowel Series 9/10 showed no HH or reflux seem, ventral hernia w/ transverse colon, no obstruction or mass in sm bowel. HERNIA, VENTRAL (ICD-553.20) << see 9/10 Hosp >> lap ventral hernia repair w/ reduction incarcerated colon by DrNewman.  DEGENERATIVE JOINT DISEASE (ICD-715.90) - she is s/p left knee arthroscopy 06-30-1994... ~  labs 5/11 showed Vit D level = 43  HEADACHE (ICD-784.0) - hx mixed HA's in the past...  ANXIETY & DEPRESSION (ICD-311) - husb passed away in Jun 30, 2002... she has CHLORAZEPATE 7.5mg  as needed... ~  11/10: pt requesting LEXAPRO 10mg /d... in f/u she notes improvement & decided to stop Rx.  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 ~  labs 11/10 showed Hg= 11.8 ~  labs 5/11 showed Hg= 11.5, MCV= 93, Fe= 93  (24%sat), B12= 250 (DrDBrodie started B12 IM monthly. ~  labs 11/11 by [facility] showed Hg= 12.2, B12= 544 & DrBrodie continued the monthly shots. ~  Labs 5/12 showed B12= 465 & pt indicates that DrBrodie stopped the B12 shots... ~  6/12:  I reviewed her chart & discussed w/ pt> we decided to Rx w/ OTC Women's Formula MVI AND Vit B12 1065mcg/d orally for now... ~  Labs 2/13 showed Hg= 13.0, MCV= 93, VitB12= 510... Ok to continue oral B12 supplement...   Past Surgical History  Procedure Date  . Cabg x 5 1997  . Cesarean section   . Left knee surgery   . Tonsillectomy   . Tubal ligation   . Resection of small bowel carcinoma 06/1998    Dr. Samuella Cota  . Lap ventral hernia repair with incarcerated colon 09/2008    Dr. Ezzard Standing    Outpatient Encounter Prescriptions as of 02/06/2011  Medication Sig Dispense Refill  . aspirin 81 MG tablet Take 81 mg by mouth daily.        Marland Kitchen atenolol (TENORMIN) 50 MG tablet TAKE 1 TABLET BY MOUTH DAILY  90 tablet  3  . cholecalciferol (VITAMIN D) 1000 UNITS tablet Take 1,000 Units by mouth daily.        . clorazepate (TRANXENE) 7.5 MG tablet Take 7.5 mg by mouth as needed.       . dicyclomine (BENTYL) 20 MG tablet Take 1 tablet (20 mg total) by mouth 3 (three) times daily as needed (for abdominal cramping).  50 tablet  5  . glimepiride (AMARYL) 4 MG tablet Take 1 tablet (4 mg total) by mouth 2 (two) times daily.  60 tablet  11  . hydrochlorothiazide 25 MG tablet TAKE 1 TABLET BY MOUTH ONCE DAILY  90 tablet  3  . HYDROcodone-acetaminophen (VICODIN) 5-500 MG per tablet Take 1 tablet by mouth every 6 (six) hours as needed for pain (do not exceed 3 tablets per day).  90 tablet  5  . metFORMIN (GLUCOPHAGE) 1000 MG tablet TAKE 1 TABLET  BY MOUTH TWICE DAILY  180 tablet  3  . Multiple Vitamin (MULTIVITAMIN) capsule Take 1 capsule by mouth daily.        Marland Kitchen NIASPAN 500 MG CR tablet TAKE 1 TABLET BY MOUTH DAILY  90 tablet  3  . PLAVIX 75 MG tablet TAKE 1 TABLET BY MOUTH DAILY   90 tablet  3  . ramipril (ALTACE) 10 MG capsule TAKE ONE CAPSULE BY MOUTH DAILY  90 capsule  3  . saxagliptin HCl (ONGLYZA) 5 MG TABS tablet Take 1 tablet (5 mg total) by mouth daily.  30 tablet  11  . CRESTOR 10 MG tablet Take by mouth Daily.      . fluticasone (FLONASE) 50 MCG/ACT nasal spray Place 2 sprays into the nose daily.        . simvastatin (ZOCOR) 40 MG tablet Take 40 mg by mouth at bedtime. ON HOLD SINCE JUNE      . vitamin B-12 (CYANOCOBALAMIN) 1000 MCG tablet Take 1,000 mcg by mouth daily.         Facility-Administered Encounter Medications as of 02/06/2011  Medication Dose Route Frequency Provider Last Rate Last Dose  . TDaP (BOOSTRIX) injection 0.5 mL  0.5 mL Intramuscular Once Michele Mcalpine, MD        Allergies  Allergen Reactions  . Codeine     REACTION: makes her nervous  . Ibuprofen     REACTION: nervous  . Meperidine Hcl     REACTION: nasuea and vomitting  . Naproxen Sodium     REACTION: nervous    Review of Systems       See HPI - all other systems neg except as noted...      The patient complains of depression.  The patient denies anorexia, fever, weight loss, weight gain, vision loss, decreased hearing, hoarseness, chest pain, syncope, dyspnea on exertion, peripheral edema, prolonged cough, headaches, hemoptysis, abdominal pain, melena, hematochezia, severe indigestion/heartburn, hematuria, incontinence, muscle weakness, suspicious skin lesions, transient blindness, difficulty walking, unusual weight change, abnormal bleeding, enlarged lymph nodes, and angioedema.     Objective:   Physical Exam      WD, WN, 71 y/o WF in NAD... GENERAL:  Alert & oriented; pleasant & cooperative... HEENT:  [place]/AT, EOM-wnl, PERRLA, EACs- sl red w/ cerumen; NOSE-clear, THROAT-mild redness, no exudate NECK:  Supple w/ adeq ROM; no JVD; normal carotid impulses w/o bruits; no thyromegaly or nodules palpated; no lymphadenopathy. CHEST:  Clear to P & A; without wheezes/ rales/ or  rhonchi heard... HEART:  Regular Rhythm; without murmurs/ rubs/ or gallops detected... ABDOMEN: scar of prev surg, abd panniculus, normal bowel sounds; min LLQ discomfort, no organomegaly or masses palpated... EXT: mild dupytren's scarring in both palms, no obvious arthritic changes, no varicose veins/ venous insuffic/ or edema. NEURO:  CN's intact; motor testing normal; sensory testing normal; gait normal & balance OK. DERM:  No lesions noted; min red at RUQ site of prev trocar insertion for laparoscopic surg- no blister, no rash, not tender...  RADIOLOGY DATA:  Reviewed in the EPIC EMR & discussed w/ the patient...    >>Last CXR 9/10 showed prev CABG, normal heart size, clear lungs...  LABORATORY DATA:  Reviewed in the EPIC EMR & discussed w/ the patient... LABS 2/13 showed:  FLP- looks good on Cres10 but TG227 & advise low fat diet;  Chems- ok x BS171 A1c7.6 on continue same;  CBC- ok;  TSH- ok;  VitD=51;  VitB12=510   Assessment & Plan:  ASHD>  Followed by [name] & his 12/12 note is reviewed...  HBP>  Controlled on meds, continue same, get wt down some...  CHOL>  [name] held her Statin Rx (Simva40) due to weakness, she says sl better off this med, & he switched her to Crestor10 & FLP looks good x TG> needs better low fat diet.  DM>  Now on Metform1000Bid, Glimep4mg Bid & Onglyza5mg  (she won't buy it, uses samples); A1c improved to 7.6 and we will continue same Rx, get on diet, get wt down!!!  GI>  GERD, Colon Polyps, Hx small bowel cancer 2000, s/p ventral hernia repair 2010>  On Omep40mg /d and BENTYL 20mg  Prn & followed by DrDBrodie...  DJD>  She uses Vicodin as needed...  Anxiety>  She takes Tranxene as needed...  Anemia>  Hg is normal now & on oral B12 supplement ~1050mcg/d w/ B12 level = 510 ok...   Patient's Medications  New Prescriptions   AMOXICILLIN-CLAVULANATE (AUGMENTIN) 875-125 MG PER TABLET    Take 1 tablet by mouth 2 (two) times daily.   PREDNISONE (STERAPRED  UNI-PAK) 5 MG TABS    Take as directed  Previous Medications   ASPIRIN 81 MG TABLET    Take 81 mg by mouth daily.     ATENOLOL (TENORMIN) 50 MG TABLET    TAKE 1 TABLET BY MOUTH DAILY   CHOLECALCIFEROL (VITAMIN D) 1000 UNITS TABLET    Take 1,000 Units by mouth daily.     CLORAZEPATE (TRANXENE) 7.5 MG TABLET    Take 7.5 mg by mouth as needed.    CRESTOR 10 MG TABLET    Take by mouth Daily.   DICYCLOMINE (BENTYL) 20 MG TABLET    Take 1 tablet (20 mg total) by mouth 3 (three) times daily as needed (for abdominal cramping).   FLUTICASONE (FLONASE) 50 MCG/ACT NASAL SPRAY    Place 2 sprays into the nose daily.     GLIMEPIRIDE (AMARYL) 4 MG TABLET    Take 1 tablet (4 mg total) by mouth 2 (two) times daily.   HYDROCHLOROTHIAZIDE 25 MG TABLET    TAKE 1 TABLET BY MOUTH ONCE DAILY   HYDROCODONE-ACETAMINOPHEN (VICODIN) 5-500 MG PER TABLET    Take 1 tablet by mouth every 6 (six) hours as needed for pain (do not exceed 3 tablets per day).   METFORMIN (GLUCOPHAGE) 1000 MG TABLET    TAKE 1 TABLET BY MOUTH TWICE DAILY   MULTIPLE VITAMIN (MULTIVITAMIN) CAPSULE    Take 1 capsule by mouth daily.     NIASPAN 500 MG CR TABLET    TAKE 1 TABLET BY MOUTH DAILY   PLAVIX 75 MG TABLET    TAKE 1 TABLET BY MOUTH DAILY   RAMIPRIL (ALTACE) 10 MG CAPSULE    TAKE ONE CAPSULE BY MOUTH DAILY   SAXAGLIPTIN HCL (ONGLYZA) 5 MG TABS TABLET    Take 1 tablet (5 mg total) by mouth daily.   SIMVASTATIN (ZOCOR) 40 MG TABLET    Take 40 mg by mouth at bedtime. ON HOLD SINCE JUNE   VITAMIN B-12 (CYANOCOBALAMIN) 1000 MCG TABLET    Take 1,000 mcg by mouth daily.    Modified Medications   No medications on file  Discontinued Medications   No medications on file

## 2011-02-07 LAB — HEMOGLOBIN A1C: Hgb A1c MFr Bld: 7.6 % — ABNORMAL HIGH (ref 4.6–6.5)

## 2011-03-01 ENCOUNTER — Telehealth: Payer: Self-pay | Admitting: Pulmonary Disease

## 2011-03-01 MED ORDER — AMOXICILLIN-POT CLAVULANATE 875-125 MG PO TABS: 1.0000 | ORAL_TABLET | Freq: Two times a day (BID) | ORAL | Status: AC

## 2011-03-01 MED ORDER — PREDNISONE (PAK) 5 MG PO TABS: ORAL_TABLET | ORAL | Status: DC

## 2011-03-01 NOTE — Telephone Encounter (Signed)
Per SN-okay to give Prednisone 5mg  6 day pak as directed no refills, Augmentin 875mg  #14 1 po bid no refills, and Align 1 po qd.

## 2011-03-01 NOTE — Telephone Encounter (Signed)
LMTCB

## 2011-03-01 NOTE — Telephone Encounter (Signed)
Pt is returning triage's call.  [name] ° °

## 2011-03-01 NOTE — Telephone Encounter (Signed)
ATC line busy x 4, WCB

## 2011-03-01 NOTE — Telephone Encounter (Signed)
Called and spoke with pt. She is c/o sinus congestion and facial pressure x 4 days. She states that her nose is burning and she has pain behind her eyes.  She also states has dry cough and PND.  She has been taking benadryl and otc cough med without much relief.  Please advise recs thanks! Allergies  Allergen Reactions  . Codeine     REACTION: makes her nervous  . Ibuprofen     REACTION: nervous  . Meperidine Hcl     REACTION: nasuea and vomitting  . Naproxen Sodium     REACTION: nervous

## 2011-03-01 NOTE — Telephone Encounter (Signed)
Spoke with pt and notified of recs per SN. She verbalized understanding and rxs were sent to pharm.

## 2011-05-09 ENCOUNTER — Telehealth: Payer: Self-pay | Admitting: Pulmonary Disease

## 2011-05-09 MED ORDER — HYDROCOD POLST-CHLORPHEN POLST 10-8 MG/5ML PO LQCR: 5.0000 mL | Freq: Two times a day (BID) | ORAL | Status: DC

## 2011-05-09 MED ORDER — AMOXICILLIN-POT CLAVULANATE 875-125 MG PO TABS: 1.0000 | ORAL_TABLET | Freq: Two times a day (BID) | ORAL | Status: AC

## 2011-05-09 NOTE — Telephone Encounter (Signed)
Per SN---ok to call in augmentin 875mg   #14   1 po bid until gone and tussionex  4 oz   1 tsp every 12 hours prn cough.  Called and spoke with pt and she is aware of both meds sent to the pharmacy.

## 2011-05-09 NOTE — Telephone Encounter (Signed)
Called, spoke with pt who states she believes she has a "sinus infection."  C/o head congestion with dark yellow mucus which now seems like it has gone into her chest.  Also, reports she has a nonprod cough, sinus pressure, and PND.  Denies wheezing, chest tightness, or chest pain.  Symptoms started on Sunday and where "really bad" then but now have improved some.  Pt is taking allergy and sinus relief and [place] DM cough syrup which are helping.  She would like to know if she needs an abx and possibly a different cough syrup.  Dr. Kriste Basque, pls advise.  Thank you.    [facility] [address]  Allergies  Allergen Reactions  . Codeine     REACTION: makes her nervous  . Ibuprofen     REACTION: nervous  . Meperidine Hcl     REACTION: nasuea and vomitting  . Naproxen Sodium     REACTION: nervous

## 2011-05-11 ENCOUNTER — Ambulatory Visit (INDEPENDENT_AMBULATORY_CARE_PROVIDER_SITE_OTHER): Payer: Medicare Other | Admitting: Adult Health

## 2011-05-11 ENCOUNTER — Encounter: Payer: Self-pay | Admitting: Adult Health

## 2011-05-11 VITALS — BP 112/60 | HR 76 | Temp 96.9°F | Ht 66.0 in | Wt 162.2 lb

## 2011-05-11 DIAGNOSIS — J209 Acute bronchitis, unspecified: Secondary | ICD-10-CM | POA: Diagnosis not present

## 2011-05-11 MED ORDER — LEVALBUTEROL HCL 0.63 MG/3ML IN NEBU
0.6300 mg | INHALATION_SOLUTION | Freq: Once | RESPIRATORY_TRACT | Status: AC
  Administered 2011-05-11: 0.63 mg via RESPIRATORY_TRACT

## 2011-05-11 MED ORDER — METHYLPREDNISOLONE ACETATE 80 MG/ML IJ SUSP
80.0000 mg | Freq: Once | INTRAMUSCULAR | Status: AC
  Administered 2011-05-11: 80 mg via INTRAMUSCULAR

## 2011-05-11 NOTE — Patient Instructions (Signed)
Mucinex DM Twice daily  As needed  Cough/congestion.  Fluids and rest  Tylenol As needed   Saline nasal rinses As needed   Finish Augmentin as planned  Nasonex 2 puffs Twice daily  Until sample is gone.  Please contact office for sooner follow up if symptoms do not improve or worsen or seek emergency care  follow up Dr. Kriste Basque  As planned

## 2011-05-11 NOTE — Assessment & Plan Note (Signed)
Flare  Albuterol neb in office  Depo 80mg  IM x 1   Plan;  Mucinex DM Twice daily  As needed  Cough/congestion.  Fluids and rest  Tylenol As needed   Saline nasal rinses As needed   Finish Augmentin as planned  Nasonex 2 puffs Twice daily  Until sample is gone.  Please contact office for sooner follow up if symptoms do not improve or worsen or seek emergency care  follow up Dr. Kriste Basque  As planned

## 2011-05-11 NOTE — Progress Notes (Signed)
Subjective:    Patient ID: [employer], female    DOB: Apr 07, 1940, 71 y.o.   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 & stopped her Simva40 at that time, he did some blood work & the pt tells me that everything was normal x her BS=250 range (we don't have copies);  Since stopping the Simva40 she feels perhaps sl better; she is also c/o some loose stool/ diarrhea & she is on Metformin 1000mg  bid> asked to incr fiber intake for now & watch this; she also reiterated her concern over DrBrodie stopping her B12 shots> avail labs in EPIC shows that 1st B12 in 5/11 was 250 & DrBrodie started monthly B12 shots (see below); pt is concerned about stopping this Rx but she was never given a trial of oral medication> rec starting 1033mcg/d oral B12 supplement & we will f/u level...  We discussed the need for BMet (BS=207) & A1c (9.4) now for f/u of her DM & medication adjustment accordingly (rec> incr Glimep 4mg Bid & add Onglyza 5mg /d); she will try to get labs from [facility] for [place] to review; I will see her back here in 525mo for recheck of all this...  ~  September 01, 2010:  30mo ROV & ADD-ON FOR RASH> noticed painful spot RUQ area & she was concerned for shingles- exam shows min red over site of trocar insertion from prev lap surg, min tender, no rash, no blisters, no signs shingles, etc;  Min tender palp lower abd, not really over site of scar, incr BS> suggestive IBS-like symptoms & we discussed fiber, moist heat, try BENTYL 20mg  Tid prn...    She reports stress fx left foot from walking (doing this for exercise to help her DM etc); given cream to [facility] by [facility]Kendall at [facility]; she has Vicodin to use prn mod pain;  Hasn't been checking BS at home due to lancet device broken;  Requesting TDAP  today & Rx for Shingles vaccine...  ~  October 03, 2010:  25mo ROV & she reports that IBS symptoms improved w/ Bentyl prn; here for 525mo f/u from June visit when BS=207 & A1c=9.4 (we incr Glimep4 to Bid & added Ongly5); labs today show BS=145 & A1c=7.6, improved & we will continue same meds, better diet, get wt down!!!  OK FLU vaccine today...  ~  February 06, 2011:  44mo ROV & she reports feeling well and "doing fine"> here for f/u blood work (BS171, A1c7.6)... See prob list below>>    She saw [name] 12/12> he had stopped her Simva40 last yr & reported dramatic improvement off this med, so he started Cres10 in it's place & tol well so far;  He was also planning CDopplers at her request to allay her anxiety about a friend w/ Carotid dis... LABS 2/13 showed:  FLP- looks good on Cres10 but TG227 & advise low fat diet;  Chems- ok x BS171 A1c7.6 on continue same;  CBC- ok;  TSH- ok;  VitD=51;  VitB12=510  05/11/2011 Acute OV  Complains of head congestion, hoarseness, sore throat, PND, dry cough, chest congestion, chills x5days - currently taking augmentin from 5.7.13 phone note. Complains of persistent cough, nasal congestion, and drainage. She  has not taken any over-the-counter medications. She is using Tussionex for cough. She denies any fever, hemoptysis, chest pain, orthopnea, PND, or leg swelling. She is requesting a steroid shot today to help her get over the worst  symptoms.  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 denies CP, palpit, syncope, edema, etc. ~  Myoview 12/08 showed perfusion defect w/ infarct/ scar & perinfarct ischemia... ~  subseq Cath 12/08 showed progressive CIRC dis & patent grafts- med Rx... ~  seen by [name]/ [facility] 9/10 w/ Myoview & 2DEcho- cleared for ventral hernia surg. ~  she continues regular f/u w/ [name], [facility]... his notes are reviewed.  HYPERTENSION  (ICD-401.9) - on ATENOLOL 50mg /d, ALTACE 10mg /d, HCTZ 25mg /d... BP= 140/76 & taking meds regularly... denies HA, visual changes, CP, palipit, syncope, dyspnea, edema, etc...  ~  Last CXR 9/10 showed prev CABG, normal heart size, clear lungs...  VENOUS INSUFFICIENCY (ICD-459.81) - on low sodium diet, elevates legs, wear support hose when able...  HYPERLIPIDEMIA (ICD-272.4) - now on CRESTOR 10mg /d & NIASPAN 500mg /d> takes her ASA prior to the Niacin Qhs; prev on Lip20 (stopped due to $$) & Simva40 (stopped due to myalgias)... ~  FLP 6/08 on Lip20 showed TChol 118, TG 155, HDL 41, LDL 46 ~  FLP 12/08 on Lip20 showed TChol 129, TG 201, HDL 37, LDL 56 ~  FLP 6/09 on Lip20 showed TChol 113, TG 162, HDL 34, LDL 47 ~  FLP 11/10 on Simva40+Niasp500 showed TChol 183, TG 347, HDL 49, LDL 72 ~  FLP 5/11 on Simva40+Niasp500 showed TChol 147, TG 307, HDL 40, LDL 49... rec better low fat diet. ~  6/12:  She tells me that [name] stopped her Simva40 due to ?myopathy; she states feeling better off statin. ~  FLP 2/13 on Cres10+Niasp500 showed TChol 122, TG 227, HDL 51, LDL 40... Needs better low fat diet.  DM (ICD-250.00) - on METFORMIN 1000mg Bid, AMARYL 4mg /d, & ONGL?YZA 5mg /d... she refuses Januvia ($$), and Actos (worried about bladder cancer). ~  labs 6/08 showed BS 110, HgA1C 6.6 ~  labs 12/08 showed BS= 170, HgA1c= 7.2 ~  labs 6/09 showed BS= 210, A1c= 8.0.Marland Kitchen. rec> add Actos 15mg /d but pt never did due to $$. ~  labs 11/10 showed BS= 189, A1c= 7.6 ~  she refuses Januvia- "it made my sugar go higher" ~  5/11> she is asking to restart ACTOS 30mg /d because home BS= 150-200 range. ~  labs 5/11 showed BS= 162, A1c= 7.4... later refused Actos due to risk of bladder cancer. ~  labs 11/11 by [facility] showed BS= 117, A1c= 7.4.Marland KitchenMarland Kitchen rec to continue Metform & Glimep. ~  Labs 6/12 showed BS= 207, A1c= 9.4.Marland KitchenMarland Kitchen rec to continue Metform, incr Glimep4mg Bid & add Onglyza5mg /d. ~  Labs 10/12 showed BS= 145, A1c= 7.6.Marland KitchenMarland Kitchen Continue same,  get wt down... ~  Labs 2/13showed BS= 171, A1c= 7.6.Marland KitchenMarland Kitchen Continue same, get wt down.  GERD (ICD-530.81) - prev on Prilosec OTC Prn- rec ch to OMEPRAZOLE 40mg /d before dinner... ~  last EGD 12/06 by DrDBrodie showed mild antral gastritis, RUT neg, ?from ASA... Rx'd Pepcid 40mg /d. ~  5/11:  c/o indigestion in eve> rec Omep40 before dinner & antireflux regimen... f/u w/ GI. ~  EGD 6/11 by DrDBrodie w/ antral gastritis & retained food, neg HPylori- Rx'd PPI.  COLONIC POLYPS (ICD-211.3) & Prob IBS >> ~  colonoscopy 12/06 by DrDBrodie showed 8mm  polyp in sigmoid area= ?path not avail... ~  colonoscopy 6/11 showed 2 polyps & int hems, bx= tub adenomas & f/u planned 55yrs. ~  8/12:  Symptoms suggesting IBS & we discussed fiber, and trial BENTYL 20mg  Tid as needed for cramping pain...  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 post op oncology DrShinn w/ adjuvant chemotherapy- 5-FU/ Leucovorin x 26weeks finished 12/00... ~  she has been followed by DrDBrodie since DrShinn left [place]... ~  small bowel capsule endoscopy 1/07 was neg- no recurrent tumor seen, 2 tiny AVM's noted... ~  CT Abd/ Pelvis 8/10 w/ large ventral hernia containing transverse colon & mesentery- no obstruction, ?hep steatosis, lumbar DJD. ~  UGI & Sm Bowel Series 9/10 showed no HH or reflux seem, ventral hernia w/ transverse colon, no obstruction or mass in sm bowel. HERNIA, VENTRAL (ICD-553.20) << see 9/10 Hosp >> lap ventral hernia repair w/ reduction incarcerated colon by DrNewman.  DEGENERATIVE JOINT DISEASE (ICD-715.90) - she is s/p left knee arthroscopy Jun 28, 1994... ~  labs 5/11 showed Vit D level = 43  HEADACHE (ICD-784.0) - hx mixed HA's in the past...  ANXIETY & DEPRESSION  (ICD-311) - husb passed away in 06-28-02... she has CHLORAZEPATE 7.5mg  as needed... ~  11/10: pt requesting LEXAPRO 10mg /d... in f/u she notes improvement & decided to stop Rx.  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 ~  labs 11/10 showed Hg= 11.8 ~  labs 5/11 showed Hg= 11.5, MCV= 93, Fe= 93 (24%sat), B12= 250 (DrDBrodie started B12 IM monthly. ~  labs 11/11 by [facility] showed Hg= 12.2, B12= 544 & DrBrodie continued the monthly shots. ~  Labs 5/12 showed B12= 465 & pt indicates that DrBrodie stopped the B12 shots... ~  6/12:  I reviewed her chart & discussed w/ pt> we decided to Rx w/ OTC Women's Formula MVI AND Vit B12 1044mcg/d orally for now... ~  Labs 2/13 showed Hg= 13.0, MCV= 93, VitB12= 510... Ok to continue oral B12 supplement...   Past Surgical History  Procedure Date  . Cabg x 5 1997  . Cesarean section   . Left knee surgery   . Tonsillectomy   . Tubal ligation   . Resection of small bowel carcinoma 06/1998    Dr. Samuella Cota  . Lap ventral hernia repair with incarcerated colon 09/2008    Dr. Ezzard Standing    Outpatient Encounter Prescriptions as of 05/11/2011  Medication Sig Dispense Refill  . amoxicillin-clavulanate (AUGMENTIN) 875-125 MG per tablet Take 1 tablet by mouth 2 (two) times daily.  14 tablet  0  . aspirin 81 MG tablet Take 81 mg by mouth daily.        Marland Kitchen atenolol (TENORMIN) 50 MG tablet TAKE 1 TABLET BY MOUTH DAILY  90 tablet  3  . chlorpheniramine-HYDROcodone (TUSSIONEX PENNKINETIC ER) 10-8 MG/5ML LQCR Take 5 mLs by mouth every 12 (twelve) hours.  140 mL  0  . cholecalciferol (VITAMIN D) 1000 UNITS tablet Take 1,000 Units by mouth daily.        . clorazepate (TRANXENE) 7.5 MG tablet Take 7.5 mg by mouth as needed.       . CRESTOR 10 MG tablet  Take by mouth Daily.      Marland Kitchen dicyclomine (BENTYL) 20 MG tablet Take 1 tablet (20 mg total) by mouth 3 (three) times  daily as needed (for abdominal cramping).  50 tablet  5  . fluticasone (FLONASE) 50 MCG/ACT nasal spray Place 2 sprays into the nose daily.        Marland Kitchen glimepiride (AMARYL) 4 MG tablet Take 1 tablet (4 mg total) by mouth 2 (two) times daily.  60 tablet  11  . hydrochlorothiazide 25 MG tablet TAKE 1 TABLET BY MOUTH ONCE DAILY  90 tablet  3  . HYDROcodone-acetaminophen (VICODIN) 5-500 MG per tablet Take 1 tablet by mouth every 6 (six) hours as needed for pain (do not exceed 3 tablets per day).  90 tablet  5  . metFORMIN (GLUCOPHAGE) 1000 MG tablet TAKE 1 TABLET BY MOUTH TWICE DAILY  180 tablet  3  . Multiple Vitamin (MULTIVITAMIN) capsule Take 1 capsule by mouth daily.        Marland Kitchen NIASPAN 500 MG CR tablet TAKE 1 TABLET BY MOUTH DAILY  90 tablet  3  . PLAVIX 75 MG tablet TAKE 1 TABLET BY MOUTH DAILY  90 tablet  3  . ramipril (ALTACE) 10 MG capsule TAKE ONE CAPSULE BY MOUTH DAILY  90 capsule  3  . saxagliptin HCl (ONGLYZA) 5 MG TABS tablet Take 1 tablet (5 mg total) by mouth daily.  30 tablet  11  . DISCONTD: predniSONE (STERAPRED UNI-PAK) 5 MG TABS Take as directed  21 tablet  0  . DISCONTD: simvastatin (ZOCOR) 40 MG tablet Take 40 mg by mouth at bedtime. ON HOLD SINCE JUNE      . DISCONTD: vitamin B-12 (CYANOCOBALAMIN) 1000 MCG tablet Take 1,000 mcg by mouth daily.         Facility-Administered Encounter Medications as of 05/11/2011  Medication Dose Route Frequency Provider Last Rate Last Dose  . DISCONTD: TDaP (BOOSTRIX) injection 0.5 mL  0.5 mL Intramuscular Once Michele Mcalpine, MD        Allergies  Allergen Reactions  . Codeine     REACTION: makes her nervous  . Ibuprofen     REACTION: nervous  . Meperidine Hcl     REACTION: nasuea and vomitting  . Naproxen Sodium     REACTION: nervous    Review of Systems       Constitutional:   No  weight loss, night sweats,  Fevers, chills, +fatigue, or  lassitude.  HEENT:   No headaches,  Difficulty swallowing,  Tooth/dental problems, or  Sore throat,                 No sneezing, itching, ear ache,  +nasal congestion, post nasal drip,   CV:  No chest pain,  Orthopnea, PND, swelling in lower extremities, anasarca, dizziness, palpitations, syncope.   GI  No heartburn, indigestion, abdominal pain, nausea, vomiting, diarrhea, change in bowel habits, loss of appetite, bloody stools.   Resp:   No coughing up of blood.   No chest wall deformity  Skin: no rash or lesions.  GU: no dysuria, change in color of urine, no urgency or frequency.  No flank pain, no hematuria   MS:  No joint pain or swelling.  No decreased range of motion.  No back pain.  Psych:  No change in mood or affect. No depression or anxiety.  No memory loss.       Objective:   Physical Exam      WD,  WN, 71 y/o WF in NAD... GENERAL:  Alert & oriented; pleasant & cooperative... HEENT:  [place]/AT, EOM-wnl, PERRLA, EACs- sl red w/ cerumen; NOSE-clear drainage , THROAT-mild redness, no exudate NECK:  Supple w/ adeq ROM; no JVD; normal carotid impulses w/o bruits; no thyromegaly or nodules palpated; no lymphadenopathy. CHEST:  Coarse BS  without wheezes/ rales/ or rhonchi heard... HEART:  Regular Rhythm; without murmurs/ rubs/ or gallops detected... ABDOMEN: scar of prev surg,   normal bowel sounds;     EXT:  , no varicose veins/ venous insuffic/ or edema. NEURO:    gait normal & balance OK. DERM:  No lesions noted;    Assessment & Plan:

## 2011-05-11 NOTE — Progress Notes (Signed)
Addended by: Boone Master E on: 05/11/2011 03:14 PM   Modules accepted: Orders

## 2011-05-22 ENCOUNTER — Telehealth: Payer: Self-pay | Admitting: Pulmonary Disease

## 2011-05-22 MED ORDER — METHYLPREDNISOLONE 4 MG PO KIT: PACK | ORAL | Status: AC

## 2011-05-22 NOTE — Telephone Encounter (Signed)
Per SN---ok for pt to have medrol dosepak  #1  Take as directed with no refills.  thanks

## 2011-05-22 NOTE — Telephone Encounter (Signed)
Pt was seen on 05/11/11 by TP and was advised to:  Patient Instructions     Mucinex DM Twice daily As needed Cough/congestion.  Fluids and rest  Tylenol As needed  Saline nasal rinses As needed  Finish Augmentin as planned  Nasonex 2 puffs Twice daily Until sample is gone.  Please contact office for sooner follow up if symptoms do not improve or worsen or seek emergency care  follow up Dr. Kriste Basque As planned    Called, spoke with pt who states she is some better since this but not completely well.  She c/o chest congestion. nonprod cough, feels weak, has no energy, loss of appetite, PND, increased SOB, and wheezing.  Denies f/c/s.  She has taken mucinex dm with no relief and reports no relief from tussionex cough syrup either.  She is requesting further recs -- Dr. Kriste Basque, pls advise.  Thank you.  [facility] on [address]   Allergies verified:  Allergies  Allergen Reactions  . Codeine     REACTION: makes her nervous  . Ibuprofen     REACTION: nervous  . Meperidine Hcl     REACTION: nasuea and vomitting  . Naproxen Sodium     REACTION: nervous

## 2011-05-22 NOTE — Telephone Encounter (Signed)
Called, spoke with pt.  I informed her of below per Dr. Kriste Basque.  She verbalized understanding of this and will call back if symptoms do not improve or worsen or will seek emergency if needed.  Dr. Kriste Basque, while trying to send medrol dosepak, epic only gave the option for 4mg  tablets with #21 tablets.  I just want to clarify this is what you want.  Thank you.

## 2011-06-05 ENCOUNTER — Other Ambulatory Visit: Payer: Self-pay | Admitting: Adult Health

## 2011-06-20 DIAGNOSIS — I70219 Atherosclerosis of native arteries of extremities with intermittent claudication, unspecified extremity: Secondary | ICD-10-CM | POA: Diagnosis not present

## 2011-06-20 DIAGNOSIS — I4510 Unspecified right bundle-branch block: Secondary | ICD-10-CM | POA: Diagnosis not present

## 2011-06-20 DIAGNOSIS — R5381 Other malaise: Secondary | ICD-10-CM | POA: Diagnosis not present

## 2011-06-20 DIAGNOSIS — R079 Chest pain, unspecified: Secondary | ICD-10-CM | POA: Diagnosis not present

## 2011-06-20 DIAGNOSIS — R5383 Other fatigue: Secondary | ICD-10-CM | POA: Diagnosis not present

## 2011-06-20 DIAGNOSIS — E782 Mixed hyperlipidemia: Secondary | ICD-10-CM | POA: Diagnosis not present

## 2011-06-20 DIAGNOSIS — Z79899 Other long term (current) drug therapy: Secondary | ICD-10-CM | POA: Diagnosis not present

## 2011-06-25 ENCOUNTER — Other Ambulatory Visit: Payer: Self-pay | Admitting: Pulmonary Disease

## 2011-07-05 ENCOUNTER — Other Ambulatory Visit: Payer: Self-pay | Admitting: Pulmonary Disease

## 2011-07-07 ENCOUNTER — Other Ambulatory Visit: Payer: Self-pay | Admitting: Pulmonary Disease

## 2011-07-12 DIAGNOSIS — I70219 Atherosclerosis of native arteries of extremities with intermittent claudication, unspecified extremity: Secondary | ICD-10-CM | POA: Diagnosis not present

## 2011-07-14 ENCOUNTER — Other Ambulatory Visit: Payer: Self-pay | Admitting: Pulmonary Disease

## 2011-08-07 ENCOUNTER — Other Ambulatory Visit (INDEPENDENT_AMBULATORY_CARE_PROVIDER_SITE_OTHER): Payer: Medicare Other

## 2011-08-07 ENCOUNTER — Other Ambulatory Visit: Payer: Self-pay | Admitting: Pulmonary Disease

## 2011-08-07 ENCOUNTER — Encounter: Payer: Self-pay | Admitting: Pulmonary Disease

## 2011-08-07 ENCOUNTER — Ambulatory Visit (INDEPENDENT_AMBULATORY_CARE_PROVIDER_SITE_OTHER): Payer: Medicare Other | Admitting: Pulmonary Disease

## 2011-08-07 VITALS — BP 142/70 | HR 70 | Temp 97.0°F | Ht 66.0 in | Wt 159.8 lb

## 2011-08-07 DIAGNOSIS — H60399 Other infective otitis externa, unspecified ear: Secondary | ICD-10-CM

## 2011-08-07 DIAGNOSIS — E785 Hyperlipidemia, unspecified: Secondary | ICD-10-CM

## 2011-08-07 DIAGNOSIS — K219 Gastro-esophageal reflux disease without esophagitis: Secondary | ICD-10-CM

## 2011-08-07 DIAGNOSIS — K589 Irritable bowel syndrome without diarrhea: Secondary | ICD-10-CM

## 2011-08-07 DIAGNOSIS — I10 Essential (primary) hypertension: Secondary | ICD-10-CM

## 2011-08-07 DIAGNOSIS — I2510 Atherosclerotic heart disease of native coronary artery without angina pectoris: Secondary | ICD-10-CM | POA: Diagnosis not present

## 2011-08-07 DIAGNOSIS — I872 Venous insufficiency (chronic) (peripheral): Secondary | ICD-10-CM | POA: Diagnosis not present

## 2011-08-07 DIAGNOSIS — F32A Depression, unspecified: Secondary | ICD-10-CM

## 2011-08-07 DIAGNOSIS — F419 Anxiety disorder, unspecified: Secondary | ICD-10-CM

## 2011-08-07 DIAGNOSIS — M1990 Unspecified osteoarthritis, unspecified site: Secondary | ICD-10-CM

## 2011-08-07 DIAGNOSIS — H6090 Unspecified otitis externa, unspecified ear: Secondary | ICD-10-CM

## 2011-08-07 DIAGNOSIS — D126 Benign neoplasm of colon, unspecified: Secondary | ICD-10-CM

## 2011-08-07 DIAGNOSIS — E119 Type 2 diabetes mellitus without complications: Secondary | ICD-10-CM | POA: Diagnosis not present

## 2011-08-07 LAB — LIPID PANEL
Cholesterol: 106 mg/dL (ref 0–200)
HDL: 54 mg/dL (ref >39.00)
LDL Cholesterol: 14 mg/dL (ref 0–99)
Total CHOL/HDL Ratio: 2
Triglycerides: 192 mg/dL — ABNORMAL HIGH (ref 0.0–149.0)
VLDL: 38.4 mg/dL (ref 0.0–40.0)

## 2011-08-07 LAB — BASIC METABOLIC PANEL
BUN: 32 mg/dL — ABNORMAL HIGH (ref 6–23)
CO2: 28 mEq/L (ref 19–32)
Calcium: 9.6 mg/dL (ref 8.4–10.5)
GFR: 34.68 mL/min — ABNORMAL LOW (ref >60.00)
Glucose, Bld: 168 mg/dL — ABNORMAL HIGH (ref 70–99)
Sodium: 140 mEq/L (ref 135–145)

## 2011-08-07 MED ORDER — CLORAZEPATE DIPOTASSIUM 7.5 MG PO TABS: 7.5000 mg | ORAL_TABLET | ORAL | Status: DC | PRN

## 2011-08-07 MED ORDER — NEOMYCIN-POLYMYXIN-HC 3.5-10000-1 OT SOLN: OTIC | Status: DC

## 2011-08-07 MED ORDER — AMOXICILLIN-POT CLAVULANATE 875-125 MG PO TABS: 1.0000 | ORAL_TABLET | Freq: Two times a day (BID) | ORAL | Status: AC

## 2011-08-07 NOTE — Patient Instructions (Addendum)
Today we updated your med list in our EPIC system...    Continue your current medications the same...    We refilled the meds you requested...  For your EAR infection:    Start the AUGMENTIN 875mg - one tab twice daily x 10d...    Start the CORTISPORIN OTIC drops- 2-3 drops in each ear three times daily...  Today we did your follow up labs...    We will call you w/ the results...  Call for any questions...  Let's plan another follow up visit in 6 months.Marland KitchenMarland Kitchen

## 2011-08-07 NOTE — Progress Notes (Signed)
Subjective:    Patient ID: [employer], female    DOB: 11/26/1940, 71 y.o.   MRN: [id number]  HPI 71 y/o WF here for a follow up visit... she has mult med problems as noted below...  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 & stopped her Simva40 at that time, he did some blood work & the pt tells me that everything was normal x her BS=250 range (we don't have copies);  Since stopping the Simva40 she feels perhaps sl better; she is also c/o some loose stool/ diarrhea & she is on Metformin 1000mg  bid> asked to incr fiber intake for now & watch this; she also reiterated her concern over DrBrodie stopping her B12 shots> avail labs in EPIC shows that 1st B12 in 5/11 was 250 & DrBrodie started monthly B12 shots (see below); pt is concerned about stopping this Rx but she was never given a trial of oral medication> rec starting 1066mcg/d oral B12 supplement & we will f/u level...  We discussed the need for BMet (BS=207) & A1c (9.4) now for f/u of her DM & medication adjustment accordingly (rec> incr Glimep 4mg Bid & add Onglyza 5mg /d); she will try to get labs from [facility] for [place] to review; I will see her back here in 19mo for recheck of all this...  ~  September 01, 2010:  62mo ROV & ADD-ON FOR RASH> noticed painful spot RUQ area & she was concerned for shingles- exam shows min red over site of trocar insertion from prev lap surg, min tender, no rash, no blisters, no signs shingles, etc;  Min tender palp lower abd, not really over site of scar, incr BS> suggestive IBS-like symptoms & we discussed fiber, moist heat, try BENTYL 20mg  Tid prn...    She reports stress fx left foot from walking (doing this for exercise to help her DM etc); given cream to use by [facility]all at [facility]; she has Vicodin to use prn mod pain;  Hasn't  been checking BS at home due to lancet device broken;  Requesting TDAP today & Rx for Shingles vaccine...  ~  October 03, 2010:  40mo ROV & she reports that IBS symptoms improved w/ Bentyl prn; here for 19mo f/u from June visit when BS=207 & A1c=9.4 (we incr Glimep4 to Bid & added Ongly5); labs today show BS=145 & A1c=7.6, improved & we will continue same meds, better diet, get wt down!!!  OK FLU vaccine today...  ~  February 06, 2011:  23mo ROV & she reports feeling well and "doing fine"> here for f/u blood work (BS171, A1c7.6)... See prob list below>>    She saw [name] 12/12> he had stopped her Simva40 last yr & reported dramatic improvement off this med, so he started Cres10 in it's place & tol well so far;  He was also planning CDopplers at her request to allay her anxiety about a friend w/ Carotid dis... LABS 2/13 showed:  FLP- looks good on Cres10 but TG227 & advise low fat diet;  Chems- ok x BS171 A1c7.6 on continue same;  CBC- ok;  TSH- ok;  VitD=51;  VitB12=510  ~  August 5,2013:  740mo ROV & Zamantha is c/o L>R ear pain x several days> exam shows left otitis externa &  we discussed treating this vigorously w/ Augmentin orally & Cortisporin Otic topically (she has seen [name] in the past for ear wax)...     >She saw [name] 6/13> ASHD, s/p CABG, HBP- on ASA/ Plavix, Aten50, Altace10, Hct25; he rec adding Protonix40 but she never filled this Rx; he placed her on MagOx400 for leg cramps & tested LE Dopplers but we don't have this result...    >CHOL> on Cres10 & Niasp500; FLP shows that Chol values are good but TG=192 & we discussed better low fat diet...    >DM> on Metform500-2Bid, Glimep4Bid, Ongly5; Labs today- BS=168, A1c=8.0, and we discussed adding Actos30 but she refused more meds (can't afford she says) & I noted that next step would be to go to Levemir Insulin...     >Renal Insuffic> likely due to HBP & DM; Creat has been 1.2 to 1.6 but she has taken extra Lasix that she has stashed from [name] &  I've asked her NOT to do this due to renal insuffic...   We reviewed prob list, meds, xrays and labs> see below for updates >> LABS 8/13:  FLP- ok on Cres10 x TG=192;  Chems- BS=168 A1c=8.0 on ;  BUN=32 Creat=1.6 on Lisin/ Hct...     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 denies CP, palpit, syncope, edema, etc. ~  Myoview 12/08 showed perfusion defect w/ infarct/ scar & perinfarct ischemia... ~  subseq Cath 12/08 showed progressive CIRC dis & patent grafts- med Rx... ~  [facility] by [name]/ [facility] 9/10 w/ Myoview & 2DEcho- cleared for ventral hernia surg. ~  she continues regular f/u w/ [name], [facility]... his notes are reviewed.  HYPERTENSION (ICD-401.9) - on ATENOLOL 50mg /d, ALTACE 10mg /d, HCTZ 25mg /d...  ~  Last CXR 9/10 showed prev CABG, normal heart size, clear lungs... ~  2/13:  BP= 140/76 & taking meds regularly... denies HA, visual changes, CP, palipit, syncope, dyspnea, edema, etc...  ~  8/13:  BP= 142/70 & denies CP, palpit, SOB, edema, etc...  VENOUS INSUFFICIENCY (ICD-459.81) - on low sodium diet, elevates legs, wear support hose when able...  HYPERLIPIDEMIA (ICD-272.4) - now on CRESTOR 10mg /d & NIASPAN 500mg /d> takes her ASA prior to the Niacin Qhs; prev on Lip20 (stopped due to $$) & Simva40 (stopped due to myalgias)... ~  FLP 6/08 on Lip20 showed TChol 118, TG 155, HDL 41, LDL 46 ~  FLP 12/08 on Lip20 showed TChol 129, TG 201, HDL 37, LDL 56 ~  FLP 6/09 on Lip20 showed TChol 113, TG 162, HDL 34, LDL 47 ~  FLP 11/10 on Simva40+Niasp500 showed TChol 183, TG 347, HDL 49, LDL 72 ~  FLP 5/11 on Simva40+Niasp500 showed TChol 147, TG 307, HDL 40, LDL 49... rec better low fat diet. ~  6/12:  She tells me that [name] stopped her Simva40 due to ?myopathy; she states feeling better off statin. ~  FLP 2/13 on Cres10+Niasp500 showed TChol 122, TG 227, HDL 51, LDL 40... Needs  better low fat diet. ~  FLP 8/13 on Cres10+Niasp500 showed TChol 106, TG 192, HDL 54, LDL 14  DM (ICD-250.00) - on METFORMIN 1000mg Bid, AMARYL 4mg /d, & ONGL?YZA 5mg /d... she refuses Januvia ($$), and Actos (worried about bladder cancer). ~  labs 6/08 showed BS 110, HgA1C 6.6 ~  labs 12/08 showed BS= 170, HgA1c= 7.2 ~  labs 6/09 showed BS= 210, A1c= 8.0.Marland Kitchen. rec> add Actos 15mg /d but  pt never did due to $$. ~  labs 11/10 showed BS= 189, A1c= 7.6 ~  she refuses Januvia- "it made my sugar go higher" ~  5/11> she is asking to restart ACTOS 30mg /d because home BS= 150-200 range. ~  labs 5/11 showed BS= 162, A1c= 7.4... later refused Actos due to risk of bladder cancer. ~  labs 11/11 by [facility] showed BS= 117, A1c= 7.4.Marland KitchenMarland Kitchen rec to continue Metform & Glimep. ~  Labs 6/12 showed BS= 207, A1c= 9.4.Marland KitchenMarland Kitchen rec to continue Metform, incr Glimep4mg Bid & add Onglyza5mg /d. ~  Labs 10/12 showed BS= 145, A1c= 7.6.Marland KitchenMarland Kitchen Continue same, get wt down... ~  Labs 2/13 showed BS= 171, A1c= 7.6.Marland KitchenMarland Kitchen Continue same, get wt down. ~  Labs 8/13 on showed BS= 168, A1c= 8.0.Marland KitchenMarland Kitchen rec to add Actos30 but she refuses more meds & we discussed need to start Insulin if not better on ret.  GERD (ICD-530.81) - prev on Prilosec OTC Prn- rec ch to OMEPRAZOLE 40mg /d before dinner... ~  last EGD 12/06 by DrDBrodie showed mild antral gastritis, RUT neg, ?from ASA... Rx'd Pepcid 40mg /d. ~  5/11:  c/o indigestion in eve> rec Omep40 before dinner & antireflux regimen... f/u w/ GI. ~  EGD 6/11 by DrDBrodie w/ antral gastritis & retained food, neg HPylori- Rx'd PPI.  COLONIC POLYPS (ICD-211.3) & Prob IBS >> ~  colonoscopy 12/06 by DrDBrodie showed 8mm polyp in sigmoid area= ?path not avail... ~  colonoscopy 6/11 showed 2 polyps & int hems, bx= tub adenomas & f/u planned 25yrs. ~  8/12:  Symptoms suggesting IBS & we discussed fiber, and trial BENTYL 20mg  Tid as needed for cramping pain...  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 post op oncology DrShinn w/ adjuvant chemotherapy- 5-FU/ Leucovorin x 26weeks finished 12/00... ~  she has been followed by DrDBrodie since DrShinn left [place]... ~  small bowel capsule endoscopy 1/07 was neg- no recurrent tumor seen, 2 tiny AVM's noted... ~  CT Abd/ Pelvis 8/10 w/ large ventral hernia containing transverse colon & mesentery- no obstruction, ?hep steatosis, lumbar DJD. ~  UGI & Sm Bowel Series 9/10 showed no HH or reflux seem, ventral hernia w/ transverse colon, no obstruction or mass in sm bowel. HERNIA, VENTRAL (ICD-553.20) << see 9/10 Hosp >> lap ventral hernia repair w/ reduction incarcerated colon by DrNewman.  DEGENERATIVE JOINT DISEASE (ICD-715.90) - on VICODIN as needed; she is s/p left knee arthroscopy 1994-06-15... ~  labs 5/11 showed Vit D level = 43  HEADACHE (ICD-784.0) - hx mixed HA's in the past...  ANXIETY & DEPRESSION (ICD-311) - husb passed away in 06-15-2002... she has CHLORAZEPATE 7.5mg  as needed... ~  11/10: pt requesting Lexapro 10mg /d... in f/u she notes improvement & decided to stop Rx.  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 ~  labs 11/10 showed Hg= 11.8 ~  labs 5/11 showed Hg= 11.5, MCV= 93, Fe= 93 (24%sat), B12= 250 (DrDBrodie started B12 IM monthly. ~  [facility] showed Hg= 12.2, B12= 544 & DrBrodie continued the monthly shots. ~  Labs 5/12 showed B12= 465 & pt indicates that DrBrodie stopped the B12 shots... ~  6/12:  I  reviewed her chart & discussed w/ pt> we decided to Rx w/ OTC Women's Formula MVI AND Vit B12 1054mcg/d orally for now... ~  Labs 2/13 showed Hg= 13.0, MCV= 93, VitB12= 510... Ok to  continue oral B12 supplement...   Past Surgical History  Procedure Date  . Cabg x 5 1997  . Cesarean section   . Left knee surgery   . Tonsillectomy   . Tubal ligation   . Resection of small bowel carcinoma 06/1998    Dr. Samuella Cota  . Lap ventral hernia repair with incarcerated colon 09/2008    Dr. Ezzard Standing    Outpatient Encounter Prescriptions as of 08/07/2011  Medication Sig Dispense Refill  . aspirin 325 MG tablet Take 325 mg by mouth daily.      Marland Kitchen atenolol (TENORMIN) 50 MG tablet TAKE 1 TABLET BY MOUTH EVERY DAY  90 tablet  2  . cholecalciferol (VITAMIN D) 1000 UNITS tablet Take 1,000 Units by mouth daily.        . clorazepate (TRANXENE) 7.5 MG tablet Take 7.5 mg by mouth as needed.       . CRESTOR 10 MG tablet Take by mouth Daily.      Marland Kitchen dicyclomine (BENTYL) 20 MG tablet Take 1 tablet (20 mg total) by mouth 3 (three) times daily as needed (for abdominal cramping).  50 tablet  5  . glimepiride (AMARYL) 4 MG tablet TAKE 1 TABLET BY MOUTH TWICE DAILY  60 tablet  10  . hydrochlorothiazide (HYDRODIURIL) 25 MG tablet TAKE 1 TABLET BY MOUTH EVERY DAY  90 tablet  2  . HYDROcodone-acetaminophen (VICODIN) 5-500 MG per tablet TAKE 1 TABLET BY MOUTH EVERY 6 HOURS AS NEEDED FOR PAIN . MAX OF 3 TABLETS DAILY  90 tablet  1  . metFORMIN (GLUCOPHAGE) 1000 MG tablet TAKE 1 TABLET BY MOUTH TWICE DAILY  180 tablet  2  . Multiple Vitamin (MULTIVITAMIN) capsule Take 1 capsule by mouth daily.        Marland Kitchen NIASPAN 500 MG CR tablet TAKE 1 TABLET BY MOUTH EVERY DAY  90 tablet  2  . ONGLYZA 5 MG TABS tablet TAKE 1 TABLET BY MOUTH DAILY  30 tablet  10  . PLAVIX 75 MG tablet TAKE 1 TABLET BY MOUTH DAILY  90 tablet  3  . ramipril (ALTACE) 10 MG capsule TAKE ONE CAPSULE BY MOUTH DAILY  90 capsule  2  . DISCONTD: aspirin 81 MG tablet Take 81 mg by mouth daily.        Marland Kitchen DISCONTD: chlorpheniramine-HYDROcodone (TUSSIONEX PENNKINETIC ER) 10-8 MG/5ML LQCR Take 5 mLs by mouth every 12 (twelve) hours.  140 mL  0  . DISCONTD:  fluticasone (FLONASE) 50 MCG/ACT nasal spray Place 2 sprays into the nose daily.        Marland Kitchen DISCONTD: glimepiride (AMARYL) 4 MG tablet TAKE 1 TABLET BY MOUTH ONCE DAILY  90 tablet  1    Allergies  Allergen Reactions  . Codeine     REACTION: makes her nervous  . Ibuprofen     REACTION: nervous  . Meperidine Hcl     REACTION: nasuea and vomitting  . Naproxen Sodium     REACTION: nervous    Review of Systems       See HPI - all other systems neg except as noted...      The patient complains of depression.  The patient denies anorexia, fever, weight loss, weight gain, vision loss, decreased hearing, hoarseness, chest pain, syncope, dyspnea on exertion,  peripheral edema, prolonged cough, headaches, hemoptysis, abdominal pain, melena, hematochezia, severe indigestion/heartburn, hematuria, incontinence, muscle weakness, suspicious skin lesions, transient blindness, difficulty walking, unusual weight change, abnormal bleeding, enlarged lymph nodes, and angioedema.     Objective:   Physical Exam      WD, WN, 71 y/o WF in NAD... GENERAL:  Alert & oriented; pleasant & cooperative... HEENT:  [place]/AT, EOM-wnl, PERRLA, EACs- sl red w/ cerumen; NOSE-clear, THROAT-mild redness, no exudate NECK:  Supple w/ adeq ROM; no JVD; normal carotid impulses w/o bruits; no thyromegaly or nodules palpated; no lymphadenopathy. CHEST:  Clear to P & A; without wheezes/ rales/ or rhonchi heard... HEART:  Regular Rhythm; without murmurs/ rubs/ or gallops detected... ABDOMEN: scar of prev surg, abd panniculus, normal bowel sounds; min LLQ discomfort, no organomegaly or masses palpated... EXT: mild dupytren's scarring in both palms, no obvious arthritic changes, no varicose veins/ venous insuffic/ or edema. NEURO:  CN's intact; motor testing normal; sensory testing normal; gait normal & balance OK. DERM:  No lesions noted; min red at RUQ site of prev trocar insertion for laparoscopic surg- no blister, no rash, not  tender...  RADIOLOGY DATA:  Reviewed in the EPIC EMR & discussed w/ the patient...    >>Last CXR 9/10 showed prev CABG, normal heart size, clear lungs...  LABORATORY DATA:  Reviewed in the EPIC EMR & discussed w/ the patient... LABS 2/13 showed:  FLP- looks good on Cres10 but TG227 & advise low fat diet;  Chems- ok x BS171 A1c7.6 on continue same;  CBC- ok;  TSH- ok;  VitD=51;  VitB12=510   Assessment & Plan:   External Otitis>  We discussed treatment w/ Augmentin 875Bid & Cortisporin Otic drops; if not responding we will consult [name]...   ASHD>  Followed by [name] & his 6/13 note is reviewed...  HBP>  Controlled on meds, continue same, get wt down some...  CHOL>  [name] held her Statin Rx (Simva40) due to weakness, she says sl better off this med, & he switched her to Crestor10 & FLP looks good x TG> needs better low fat diet.  DM>  Now on Metform1000Bid, Glimep4mg Bid & Onglyza5mg  (she won't buy it, uses samples); A1c improved to 7.6 and we will continue same Rx, get on diet, get wt down!!!  GI>  GERD, Colon Polyps, Hx small bowel cancer 2000, s/p ventral hernia repair 2010>  On Omep40mg /d and BENTYL 20mg  Prn & followed by DrDBrodie...  DJD>  She uses Vicodin as needed...  Anxiety>  She takes Tranxene as needed...  Anemia>  Hg is normal now & on oral B12 supplement ~1036mcg/d w/ B12 level = 510 ok...   Patient's Medications  New Prescriptions   AMOXICILLIN-CLAVULANATE (AUGMENTIN) 875-125 MG PER TABLET    Take 1 tablet by mouth 2 (two) times daily.   NEOMYCIN-POLYMYXIN-HYDROCORTISONE (CORTISPORIN) OTIC SOLUTION    2-3 drops in each ear three times daily  Previous Medications   ASPIRIN 325 MG TABLET    Take 325 mg by mouth daily.   ATENOLOL (TENORMIN) 50 MG TABLET    TAKE 1 TABLET BY MOUTH EVERY DAY   CHOLECALCIFEROL (VITAMIN D) 1000 UNITS TABLET    Take 1,000 Units by mouth daily.     CRESTOR 10 MG TABLET    Take by mouth Daily.   DICYCLOMINE (BENTYL) 20 MG TABLET     Take 1 tablet (20 mg total) by mouth 3 (three) times daily as needed (for abdominal cramping).   GLIMEPIRIDE (AMARYL) 4 MG TABLET  TAKE 1 TABLET BY MOUTH TWICE DAILY   HYDROCHLOROTHIAZIDE (HYDRODIURIL) 25 MG TABLET    TAKE 1 TABLET BY MOUTH EVERY DAY   HYDROCODONE-ACETAMINOPHEN (VICODIN) 5-500 MG PER TABLET    TAKE 1 TABLET BY MOUTH EVERY 6 HOURS AS NEEDED FOR PAIN . MAX OF 3 TABLETS DAILY   METFORMIN (GLUCOPHAGE) 1000 MG TABLET    TAKE 1 TABLET BY MOUTH TWICE DAILY   MULTIPLE VITAMIN (MULTIVITAMIN) CAPSULE    Take 1 capsule by mouth daily.     NIASPAN 500 MG CR TABLET    TAKE 1 TABLET BY MOUTH EVERY DAY   ONGLYZA 5 MG TABS TABLET    TAKE 1 TABLET BY MOUTH DAILY   RAMIPRIL (ALTACE) 10 MG CAPSULE    TAKE ONE CAPSULE BY MOUTH DAILY  Modified Medications   Modified Medication Previous Medication   CLOPIDOGREL (PLAVIX) 75 MG TABLET PLAVIX 75 MG tablet      TAKE ONE TABLET BY MOUTH DAILY    TAKE 1 TABLET BY MOUTH DAILY   CLORAZEPATE (TRANXENE) 7.5 MG TABLET clorazepate (TRANXENE) 7.5 MG tablet      Take 1 tablet (7.5 mg total) by mouth as needed.    Take 7.5 mg by mouth as needed.   Discontinued Medications   ASPIRIN 81 MG TABLET    Take 81 mg by mouth daily.     CHLORPHENIRAMINE-HYDROCODONE (TUSSIONEX PENNKINETIC ER) 10-8 MG/5ML LQCR    Take 5 mLs by mouth every 12 (twelve) hours.   FLUTICASONE (FLONASE) 50 MCG/ACT NASAL SPRAY    Place 2 sprays into the nose daily.     GLIMEPIRIDE (AMARYL) 4 MG TABLET    TAKE 1 TABLET BY MOUTH ONCE DAILY

## 2011-08-15 ENCOUNTER — Telehealth: Payer: Self-pay | Admitting: Pulmonary Disease

## 2011-08-15 NOTE — Telephone Encounter (Signed)
Received a fax from [facility] [address] for [place] for clorazepate 7.5mg . I called [phone] and gave pt ID # [id number]. Spoke with Mardelle Matte and gave ICD 9 code of 300.2, Anxiety. Medication approved through 08-14-12. Pharmacy is aware. Carron Curie, CMA

## 2011-10-14 ENCOUNTER — Telehealth: Payer: Self-pay | Admitting: Pulmonary Disease

## 2011-10-14 ENCOUNTER — Other Ambulatory Visit: Payer: Self-pay | Admitting: Pulmonary Disease

## 2011-10-14 NOTE — Telephone Encounter (Signed)
I received a call from the pharmacy; the patient needed it a refill on her Glimepiride.   I verbally refilled it for 90 tablets, prn refills

## 2011-10-18 ENCOUNTER — Other Ambulatory Visit: Payer: Self-pay | Admitting: Pulmonary Disease

## 2011-10-18 ENCOUNTER — Telehealth: Payer: Self-pay | Admitting: Pulmonary Disease

## 2011-10-18 DIAGNOSIS — I10 Essential (primary) hypertension: Secondary | ICD-10-CM

## 2011-10-18 DIAGNOSIS — E119 Type 2 diabetes mellitus without complications: Secondary | ICD-10-CM

## 2011-10-18 MED ORDER — GLIMEPIRIDE 4 MG PO TABS: 4.0000 mg | ORAL_TABLET | Freq: Two times a day (BID) | ORAL | Status: DC

## 2011-10-18 MED ORDER — HYDROCODONE-ACETAMINOPHEN 5-325 MG PO TABS: 1.0000 | ORAL_TABLET | Freq: Three times a day (TID) | ORAL | Status: DC | PRN

## 2011-10-18 NOTE — Telephone Encounter (Signed)
Spoke with pt and she is aware that rx for the norco has been called to the pharmacy and i advised her that i would have to call the insurance company to get the to override her rx for the glimepiride since it was sent in incorrectly.  Pt stated that she has been taking this med bid and ran out early and they would not refill it again.  i called the pharmacy and i have sent in new rx with correct directions and the pharmacy will call with any new problems for this new rx that has been sent in.

## 2011-10-19 NOTE — Telephone Encounter (Signed)
i called and spoke with pharmacy and they stated that the rx did go through.  i have called and spoke with pt and she is aware of the norco that has been called to the pharmacy and the glimepiride 1 po bid has been sent in as well. Nothing further is needed.

## 2011-11-07 ENCOUNTER — Other Ambulatory Visit (INDEPENDENT_AMBULATORY_CARE_PROVIDER_SITE_OTHER): Payer: Medicare Other

## 2011-11-07 DIAGNOSIS — I10 Essential (primary) hypertension: Secondary | ICD-10-CM

## 2011-11-07 DIAGNOSIS — E119 Type 2 diabetes mellitus without complications: Secondary | ICD-10-CM

## 2011-11-07 LAB — BASIC METABOLIC PANEL
BUN: 29 mg/dL — ABNORMAL HIGH (ref 6–23)
CO2: 24 mEq/L (ref 19–32)
Glucose, Bld: 159 mg/dL — ABNORMAL HIGH (ref 70–99)
Potassium: 4.4 mEq/L (ref 3.5–5.1)
Sodium: 139 mEq/L (ref 135–145)

## 2011-11-08 ENCOUNTER — Encounter: Payer: Self-pay | Admitting: *Deleted

## 2011-11-09 ENCOUNTER — Encounter: Payer: Self-pay | Admitting: Pulmonary Disease

## 2011-11-09 ENCOUNTER — Ambulatory Visit (INDEPENDENT_AMBULATORY_CARE_PROVIDER_SITE_OTHER): Payer: Medicare Other | Admitting: Pulmonary Disease

## 2011-11-09 VITALS — BP 124/70 | HR 69 | Temp 98.1°F | Ht 66.0 in | Wt 156.2 lb

## 2011-11-09 DIAGNOSIS — Z862 Personal history of diseases of the blood and blood-forming organs and certain disorders involving the immune mechanism: Secondary | ICD-10-CM

## 2011-11-09 DIAGNOSIS — I2510 Atherosclerotic heart disease of native coronary artery without angina pectoris: Secondary | ICD-10-CM | POA: Diagnosis not present

## 2011-11-09 DIAGNOSIS — Z23 Encounter for immunization: Secondary | ICD-10-CM

## 2011-11-09 DIAGNOSIS — I872 Venous insufficiency (chronic) (peripheral): Secondary | ICD-10-CM

## 2011-11-09 DIAGNOSIS — D126 Benign neoplasm of colon, unspecified: Secondary | ICD-10-CM

## 2011-11-09 DIAGNOSIS — R51 Headache: Secondary | ICD-10-CM

## 2011-11-09 DIAGNOSIS — F329 Major depressive disorder, single episode, unspecified: Secondary | ICD-10-CM

## 2011-11-09 DIAGNOSIS — K219 Gastro-esophageal reflux disease without esophagitis: Secondary | ICD-10-CM

## 2011-11-09 DIAGNOSIS — E119 Type 2 diabetes mellitus without complications: Secondary | ICD-10-CM

## 2011-11-09 DIAGNOSIS — I10 Essential (primary) hypertension: Secondary | ICD-10-CM

## 2011-11-09 DIAGNOSIS — K589 Irritable bowel syndrome without diarrhea: Secondary | ICD-10-CM

## 2011-11-09 DIAGNOSIS — E785 Hyperlipidemia, unspecified: Secondary | ICD-10-CM

## 2011-11-09 DIAGNOSIS — M1990 Unspecified osteoarthritis, unspecified site: Secondary | ICD-10-CM

## 2011-11-09 DIAGNOSIS — C179 Malignant neoplasm of small intestine, unspecified: Secondary | ICD-10-CM

## 2011-11-09 MED ORDER — OMEPRAZOLE 40 MG PO CPDR: 40.0000 mg | DELAYED_RELEASE_CAPSULE | Freq: Every day | ORAL | Status: DC

## 2011-11-09 MED ORDER — ROSUVASTATIN CALCIUM 10 MG PO TABS: 10.0000 mg | ORAL_TABLET | Freq: Every day | ORAL | Status: DC

## 2011-11-09 MED ORDER — SAXAGLIPTIN HCL 5 MG PO TABS: 5.0000 mg | ORAL_TABLET | Freq: Every day | ORAL | Status: DC

## 2011-11-09 NOTE — Progress Notes (Signed)
Subjective:    Patient ID: [employer], female    DOB: Apr 20, 1940, 71 y.o.   MRN: [id number]  HPI 71 y/o WF here for a follow up visit... she has mult med problems as noted below...  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 & stopped her Simva40 at that time, he did some blood work & the pt tells me that everything was normal x her BS=250 range (we don't have copies);  Since stopping the Simva40 she feels perhaps sl better; she is also c/o some loose stool/ diarrhea & she is on Metformin 1000mg  bid> asked to incr fiber intake for now & watch this; she also reiterated her concern over DrBrodie stopping her B12 shots> avail labs in EPIC shows that 1st B12 in 5/11 was 250 & DrBrodie started monthly B12 shots (see below); pt is concerned about stopping this Rx but she was never given a trial of oral medication> rec starting 1075mcg/d oral B12 supplement & we will f/u level...  We discussed the need for BMet (BS=207) & A1c (9.4) now for f/u of her DM & medication adjustment accordingly (rec> incr Glimep 4mg Bid & add Onglyza 5mg /d); she will try to get labs from [facility] for [place] to review; I will see her back here in 539mo for recheck of all this...  ~  September 01, 2010:  30mo ROV & ADD-ON FOR RASH> noticed painful spot RUQ area & she was concerned for shingles- exam shows min red over site of trocar insertion from prev lap surg, min tender, no rash, no blisters, no signs shingles, etc;  Min tender palp lower abd, not really over site of scar, incr BS> suggestive IBS-like symptoms & we discussed fiber, moist heat, try BENTYL 20mg  Tid prn...    She reports stress fx left foot from walking (doing this for exercise to help her DM etc); given cream to use by [facility]Kendall [facility]; she has Vicodin to use prn mod pain;  Hasn't  been checking BS at home due to lancet device broken;  Requesting TDAP today & Rx for Shingles vaccine...  ~  October 03, 2010:  27mo ROV & she reports that IBS symptoms improved w/ Bentyl prn; here for 539mo f/u from June visit when BS=207 & A1c=9.4 (we incr Glimep4 to Bid & added Ongly5); labs today show BS=145 & A1c=7.6, improved & we will continue same meds, better diet, get wt down!!!  OK FLU vaccine today...  ~  February 06, 2011:  63mo ROV & she reports feeling well and "doing fine"> here for f/u blood work (BS171, A1c7.6)... See prob list below>>    She saw [name] 12/12> he had stopped her Simva40 last yr & reported dramatic improvement off this med, so he started Cres10 in it's place & tol well so far;  He was also planning CDopplers at her request to allay her anxiety about a friend w/ Carotid dis... LABS 2/13 showed:  FLP- looks good on Cres10 but TG227 & advise low fat diet;  Chems- ok x BS171 A1c7.6 on continue same;  CBC- ok;  TSH- ok;  VitD=51;  VitB12=510  ~  August 5,2013:  65mo ROV & [name] is c/o L>R ear pain x several days> exam shows left otitis externa &  we discussed treating this vigorously w/ Augmentin orally & Cortisporin Otic topically (she has seen [name] in the past for ear wax)...     >She saw [name] 6/13> ASHD, s/p CABG, HBP- on ASA/ Plavix, Aten50, Altace10, Hct25; he rec adding Protonix40 but she never filled this Rx; he placed her on MagOx400 for leg cramps & tested LE Dopplers but we don't have this result...    >CHOL> on Cres10 & Niasp500; FLP shows that Chol values are good but TG=192 & we discussed better low fat diet...    >DM> on Metform500-2Bid, Glimep4Bid, Ongly5; Labs today- BS=168, A1c=8.0, and we discussed adding Actos30 but she refused more meds (can't afford she says) & I noted that next step would be to go to Levemir Insulin...     >Renal Insuffic> likely due to HBP & DM; Creat has been 1.2 to 1.6 but she has taken extra Lasix that she has stashed from [name] &  I've asked her NOT to do this due to renal insuffic...   We reviewed prob list, meds, xrays and labs> see below for updates >> LABS 8/13:  FLP- ok on Cres10 x TG=192;  Chems- BS=168 A1c=8.0 on ;  BUN=32 Creat=1.6 on Lisin/ Hct...   ~  November 09, 2011:  46mo ROV & [name] is c/o fall allergies w/ nasal congestion etc; we reviewed Recs for Allegra180 & nasal saline Q1-2H while awake...    HBP> on Aten50, Altace5, Hct25; BP= 124/70 & she denies CP, palpit, dizzy, SOB, edema, etc...    ASHD> on ASA/ Plavix; she is s/p CABG 1997; followed by [name]- denies angina, palpit, syncope etc...    VI> on low sodium diet, elevation, support hose, etc...    Hyperlipid> on Cres10, Niasp500; last FLP 8/13 showed TChol 106, TG 192, HDL 54, LDL 14; needs better low fat diet...    DM> on Metform1000Bid, Glim4Bid, Onglyza50; labs showed BS=159, A1c=7.9 and she does not want to increase her meds...    GI> GERD, IBS, Polyps> on Bentyl20 prn; c/o incr reflux symptoms, but off his PPI; rec to incr Omep40Bid...    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    DJD> On Vicodin, MVI, VitD1000; she claims her arthritis is "bad" but the Vicodin helps...    Others... On Vicodin, MVI, VitD1000... We reviewed prob list, meds, xrays and labs> see below for updates >> ok flu shot today... 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 denies CP, palpit, syncope, edema, etc. ~  Myoview 12/08 showed perfusion defect w/ infarct/ scar & perinfarct ischemia... ~  subseq Cath 12/08 showed progressive CIRC dis & patent grafts- med Rx... ~  seen by [name]/ [facility] 9/10 w/ Myoview & 2DEcho- cleared for ventral hernia surg. ~  she continues regular f/u w/ [name], [facility]... his  notes are reviewed.  HYPERTENSION (ICD-401.9) - on ATENOLOL 50mg /d, ALTACE 10mg /d, HCTZ 25mg /d...  ~  Last CXR 9/10 showed prev CABG, normal heart size, clear lungs... ~  2/13:  BP= 140/76 & taking meds regularly... denies HA, visual changes, CP, palipit, syncope, dyspnea, edema, etc...  ~  8/13:  BP= 142/70 & denies CP, palpit, SOB, edema, etc... ~  11/13:  BP= 124/70 & she remains largely asymptomatic...  VENOUS INSUFFICIENCY (ICD-459.81) - on low sodium diet, elevates legs, wear support hose when able...  HYPERLIPIDEMIA (ICD-272.4) - now on CRESTOR 10mg /d & NIASPAN 500mg /d> takes her ASA prior to the Niacin Qhs; prev on Lip20 (stopped due to $$) & Simva40 (stopped due to myalgias)... ~  FLP 6/08 on Lip20 showed TChol 118, TG 155, HDL 41, LDL 46 ~  FLP 12/08 on Lip20 showed TChol 129, TG 201, HDL 37, LDL 56 ~  FLP 6/09 on Lip20 showed TChol 113, TG 162, HDL 34, LDL 47 ~  FLP 11/10 on Simva40+Niasp500 showed TChol 183, TG 347, HDL 49, LDL 72 ~  FLP 5/11 on Simva40+Niasp500 showed TChol 147, TG 307, HDL 40, LDL 49... rec better low fat diet. ~  6/12:  She tells me that [name] stopped her Simva40 due to ?myopathy; she states feeling better off statin. ~  FLP 2/13 on Cres10+Niasp500 showed TChol 122, TG 227, HDL 51, LDL 40... Needs better low fat diet. ~  FLP 8/13 on Cres10+Niasp500 showed TChol 106, TG 192, HDL 54, LDL 14  DM (ICD-250.00) - on METFORMIN 1000mg Bid, AMARYL 4mg /d, & ONGL?YZA 5mg /d... she refuses Januvia ($$), and Actos (worried about bladder cancer). ~  labs 6/08 showed BS 110, HgA1C 6.6 ~  labs 12/08 showed BS= 170, HgA1c= 7.2 ~  labs 6/09 showed BS= 210, A1c= 8.0.Marland Kitchen. rec> add Actos 15mg /d but pt never did due to $$. ~  labs 11/10 showed BS= 189, A1c= 7.6 ~  she refuses Januvia- "it made my sugar go higher" ~  5/11> she is asking to restart ACTOS 30mg /d because home BS= 150-200 range. ~  labs 5/11 showed BS= 162, A1c= 7.4... later refused Actos due to risk of bladder cancer. ~   labs 11/11 by [facility] showed BS= 117, A1c= 7.4.Marland KitchenMarland Kitchen rec to continue Metform & Glimep. ~  Labs 6/12 showed BS= 207, A1c= 9.4.Marland KitchenMarland Kitchen rec to continue Metform, incr Glimep4mg Bid & add Onglyza5mg /d. ~  Labs 10/12 showed BS= 145, A1c= 7.6.Marland KitchenMarland Kitchen Continue same, get wt down... ~  Labs 2/13 showed BS= 171, A1c= 7.6.Marland KitchenMarland Kitchen Continue same, get wt down. ~  Labs 8/13 on showed BS= 168, A1c= 8.0.Marland KitchenMarland Kitchen rec to add Actos30 but she refuses more meds & we discussed need to start Insulin if not better on ret. ~  Labs 11/13 on 3 meds showed BS= 159, A1c= 7.9 & she does not wish to increase her meds at this point...  GERD (ICD-530.81) - prev on Prilosec OTC Prn- rec ch to OMEPRAZOLE 40mg /d before dinner... ~  last EGD 12/06 by DrDBrodie showed mild antral gastritis, RUT neg, ?from ASA... Rx'd Pepcid 40mg /d. ~  5/11:  c/o indigestion in eve> rec Omep40 before dinner & antireflux regimen... f/u w/ GI. ~  EGD 6/11 by DrDBrodie w/ antral gastritis & retained food, neg HPylori- Rx'd PPI.  COLONIC POLYPS (ICD-211.3) & Prob IBS >> ~  colonoscopy 12/06 by DrDBrodie showed 8mm polyp in sigmoid area= ?path not avail... ~  colonoscopy 6/11 showed 2 polyps & int hems, bx= tub adenomas & f/u planned 53yrs. ~  8/12:  Symptoms suggesting IBS & we discussed fiber, and trial BENTYL 20mg  Tid as needed for cramping pain...  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 post op oncology  DrShinn w/ adjuvant chemotherapy- 5-FU/ Leucovorin x 26weeks finished 12/00... ~  she has been followed by DrDBrodie since DrShinn left [place]... ~  small bowel capsule endoscopy 1/07 was neg- no recurrent tumor seen, 2 tiny AVM's noted... ~  CT Abd/ Pelvis 8/10 w/ large ventral hernia containing transverse colon &  mesentery- no obstruction, ?hep steatosis, lumbar DJD. ~  UGI & Sm Bowel Series 9/10 showed no HH or reflux seem, ventral hernia w/ transverse colon, no obstruction or mass in sm bowel. HERNIA, VENTRAL (ICD-553.20) << see 9/10 Hosp >> lap ventral hernia repair w/ reduction incarcerated colon by DrNewman.  DEGENERATIVE JOINT DISEASE (ICD-715.90) - on VICODIN as needed; she is s/p left knee arthroscopy 06/05/1994... ~  labs 5/11 showed Vit D level = 43  HEADACHE (ICD-784.0) - hx mixed HA's in the past...  ANXIETY & DEPRESSION (ICD-311) - husb passed away in 06/05/2002... she has CHLORAZEPATE 7.5mg  as needed... ~  11/10: pt requesting Lexapro 10mg /d... in f/u she notes improvement & decided to stop Rx.  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 ~  labs 11/10 showed Hg= 11.8 ~  labs 5/11 showed Hg= 11.5, MCV= 93, Fe= 93 (24%sat), B12= 250 (DrDBrodie started B12 IM monthly. ~  labs 11/11 by [facility] showed Hg= 12.2, B12= 544 & DrBrodie continued the monthly shots. ~  Labs 5/12 showed B12= 465 & pt indicates that DrBrodie stopped the B12 shots... ~  6/12:  I reviewed her chart & discussed w/ pt> we decided to Rx w/ OTC Women's Formula MVI AND Vit B12 1058mcg/d orally for now... ~  Labs 2/13 showed Hg= 13.0, MCV= 93, VitB12= 510... Ok to continue oral B12 supplement...   Past Surgical History  Procedure Date  . Cabg x 5 1997  . Cesarean section   . Left knee surgery   . Tonsillectomy   . Tubal ligation   . Resection of small bowel carcinoma 06/1998    Dr. Samuella Cota  . Lap ventral hernia repair with incarcerated colon 09/2008    Dr. Ezzard Standing    Outpatient Encounter Prescriptions as of 11/09/2011  Medication Sig Dispense Refill  . aspirin 325 MG tablet Take 325 mg by mouth daily.      Marland Kitchen atenolol (TENORMIN) 50 MG tablet TAKE 1 TABLET BY MOUTH EVERY DAY  90 tablet  2  . cholecalciferol  (VITAMIN D) 1000 UNITS tablet Take 1,000 Units by mouth daily.        . clopidogrel (PLAVIX) 75 MG tablet TAKE ONE TABLET BY MOUTH DAILY  90 tablet  2  . clorazepate (TRANXENE) 7.5 MG tablet Take 1 tablet (7.5 mg total) by mouth as needed.  30 tablet  5  . CRESTOR 10 MG tablet Take by mouth Daily.      Marland Kitchen dicyclomine (BENTYL) 20 MG tablet Take 1 tablet (20 mg total) by mouth 3 (three) times daily as needed (for abdominal cramping).  50 tablet  5  . glimepiride (AMARYL) 4 MG tablet Take 1 tablet (4 mg total) by mouth 2 (two) times daily.  180 tablet  3  . hydrochlorothiazide (HYDRODIURIL) 25 MG tablet TAKE 1 TABLET BY MOUTH EVERY DAY  90 tablet  2  . HYDROcodone-acetaminophen (NORCO/VICODIN) 5-325 MG per tablet Take 1 tablet by mouth 3 (three) times daily as needed for pain.  90 tablet  5  . metFORMIN (GLUCOPHAGE) 1000 MG  tablet TAKE 1 TABLET BY MOUTH TWICE DAILY  180 tablet  2  . Multiple Vitamin (MULTIVITAMIN) capsule Take 1 capsule by mouth daily.        Marland Kitchen neomycin-polymyxin-hydrocortisone (CORTISPORIN) otic solution 2-3 drops in each ear three times daily  10 mL  2  . NIASPAN 500 MG CR tablet TAKE 1 TABLET BY MOUTH EVERY DAY  90 tablet  2  . ONGLYZA 5 MG TABS tablet TAKE 1 TABLET BY MOUTH DAILY  30 tablet  10  . ramipril (ALTACE) 10 MG capsule TAKE ONE CAPSULE BY MOUTH DAILY  90 capsule  2    Allergies  Allergen Reactions  . Codeine     REACTION: makes her nervous  . Ibuprofen     REACTION: nervous  . Meperidine Hcl     REACTION: nasuea and vomitting  . Naproxen Sodium     REACTION: nervous    Review of Systems       See HPI - all other systems neg except as noted...      The patient complains of depression.  The patient denies anorexia, fever, weight loss, weight gain, vision loss, decreased hearing, hoarseness, chest pain, syncope, dyspnea on exertion, peripheral edema, prolonged cough, headaches, hemoptysis, abdominal pain, melena, hematochezia, severe indigestion/heartburn,  hematuria, incontinence, muscle weakness, suspicious skin lesions, transient blindness, difficulty walking, unusual weight change, abnormal bleeding, enlarged lymph nodes, and angioedema.     Objective:   Physical Exam      WD, WN, 71 y/o WF in NAD... GENERAL:  Alert & oriented; pleasant & cooperative... HEENT:  [place]/AT, EOM-wnl, PERRLA, EACs- sl red w/ cerumen; NOSE-clear, THROAT-mild redness, no exudate NECK:  Supple w/ adeq ROM; no JVD; normal carotid impulses w/o bruits; no thyromegaly or nodules palpated; no lymphadenopathy. CHEST:  Clear to P & A; without wheezes/ rales/ or rhonchi heard... HEART:  Regular Rhythm; without murmurs/ rubs/ or gallops detected... ABDOMEN: scar of prev surg, abd panniculus, normal bowel sounds; min LLQ discomfort, no organomegaly or masses palpated... EXT: mild dupytren's scarring in both palms, no obvious arthritic changes, no varicose veins/ venous insuffic/ or edema. NEURO:  CN's intact; motor testing normal; sensory testing normal; gait normal & balance OK. DERM:  No lesions noted; min red at RUQ site of prev trocar insertion for laparoscopic surg- no blister, no rash, not tender...  RADIOLOGY DATA:  Reviewed in the EPIC EMR & discussed w/ the patient...    >>Last CXR 9/10 showed prev CABG, normal heart size, clear lungs...  LABORATORY DATA:  Reviewed in the EPIC EMR & discussed w/ the patient... LABS 2/13 showed:  FLP- looks good on Cres10 but TG227 & advise low fat diet;  Chems- ok x BS171 A1c7.6 on continue same;  CBC- ok;  TSH- ok;  VitD=51;  VitB12=510   Assessment & Plan:    ASHD>  Followed by [name] & his 6/13 note is reviewed...  HBP>  Controlled on meds, continue same, get wt down some...  CHOL>  [name] held her Statin Rx (Simva40) due to weakness, she says sl better off this med, & he switched her to Crestor10 & FLP looks good x TG> needs better low fat diet.  DM>  Now on Metform1000Bid, Glimep4mg Bid & Onglyza5mg  (she won't buy it,  uses samples); A1c improved to 7.6 and we will continue same Rx, get on diet, get wt down!!!  GI>  GERD, Colon Polyps, Hx small bowel cancer 2000, s/p ventral hernia repair 2010>  On Omep40mg /d and BENTYL  20mg  Prn & followed by DrDBrodie...  DJD>  She uses Vicodin as needed...  Anxiety>  She takes Tranxene as needed...  Anemia>  Hg is normal now & on oral B12 supplement ~1067mcg/d w/ B12 level = 510 ok...   Patient's Medications  New Prescriptions   OMEPRAZOLE (PRILOSEC) 40 MG CAPSULE    Take 1 capsule (40 mg total) by mouth daily.  Previous Medications   ASPIRIN 325 MG TABLET    Take 325 mg by mouth daily.   ATENOLOL (TENORMIN) 50 MG TABLET    TAKE 1 TABLET BY MOUTH EVERY DAY   CHOLECALCIFEROL (VITAMIN D) 1000 UNITS TABLET    Take 1,000 Units by mouth daily.     CLOPIDOGREL (PLAVIX) 75 MG TABLET    TAKE ONE TABLET BY MOUTH DAILY   CLORAZEPATE (TRANXENE) 7.5 MG TABLET    Take 1 tablet (7.5 mg total) by mouth as needed.   DICYCLOMINE (BENTYL) 20 MG TABLET    Take 1 tablet (20 mg total) by mouth 3 (three) times daily as needed (for abdominal cramping).   GLIMEPIRIDE (AMARYL) 4 MG TABLET    Take 1 tablet (4 mg total) by mouth 2 (two) times daily.   HYDROCHLOROTHIAZIDE (HYDRODIURIL) 25 MG TABLET    TAKE 1 TABLET BY MOUTH EVERY DAY   HYDROCODONE-ACETAMINOPHEN (NORCO/VICODIN) 5-325 MG PER TABLET    Take 1 tablet by mouth 3 (three) times daily as needed for pain.   METFORMIN (GLUCOPHAGE) 1000 MG TABLET    TAKE 1 TABLET BY MOUTH TWICE DAILY   MULTIPLE VITAMIN (MULTIVITAMIN) CAPSULE    Take 1 capsule by mouth daily.     NEOMYCIN-POLYMYXIN-HYDROCORTISONE (CORTISPORIN) OTIC SOLUTION    2-3 drops in each ear three times daily   NIASPAN 500 MG CR TABLET    TAKE 1 TABLET BY MOUTH EVERY DAY   RAMIPRIL (ALTACE) 10 MG CAPSULE    TAKE ONE CAPSULE BY MOUTH DAILY  Modified Medications   Modified Medication Previous Medication   ROSUVASTATIN (CRESTOR) 10 MG TABLET CRESTOR 10 MG tablet      Take 1 tablet  (10 mg total) by mouth daily.    Take by mouth Daily.   SAXAGLIPTIN HCL (ONGLYZA) 5 MG TABS TABLET ONGLYZA 5 MG TABS tablet      Take 1 tablet (5 mg total) by mouth daily.    TAKE 1 TABLET BY MOUTH DAILY  Discontinued Medications   No medications on file

## 2011-11-09 NOTE — Patient Instructions (Addendum)
Today we updated your med list in our EPIC system...    Continue your current medications the same...  For your reflux we decided to re-start your OMEPRAZOLE 40mg  - taken daily 30 min before a meal...  We gave you the 2013 Flu vaccine today...  Keep up the good work w/ diet & exercise... The goal is to lose 10-15 lbs...  Continue the same 3 diabetic meds regularly...  Call for any questions...  Let's plan a follow up visit in about 6 months.Marland KitchenMarland Kitchen

## 2012-02-06 ENCOUNTER — Ambulatory Visit: Payer: Medicare Other | Admitting: Pulmonary Disease

## 2012-03-14 ENCOUNTER — Other Ambulatory Visit: Payer: Self-pay | Admitting: Pulmonary Disease

## 2012-03-14 MED ORDER — CLOPIDOGREL BISULFATE 75 MG PO TABS: 75.0000 mg | ORAL_TABLET | Freq: Every day | ORAL | Status: DC

## 2012-04-02 ENCOUNTER — Other Ambulatory Visit: Payer: Self-pay | Admitting: Pulmonary Disease

## 2012-05-08 ENCOUNTER — Other Ambulatory Visit (INDEPENDENT_AMBULATORY_CARE_PROVIDER_SITE_OTHER): Payer: Medicare Other

## 2012-05-08 ENCOUNTER — Encounter: Payer: Self-pay | Admitting: Pulmonary Disease

## 2012-05-08 ENCOUNTER — Ambulatory Visit (INDEPENDENT_AMBULATORY_CARE_PROVIDER_SITE_OTHER): Payer: Medicare Other | Admitting: Pulmonary Disease

## 2012-05-08 VITALS — BP 140/78 | HR 65 | Temp 98.0°F | Ht 66.0 in | Wt 163.4 lb

## 2012-05-08 DIAGNOSIS — E119 Type 2 diabetes mellitus without complications: Secondary | ICD-10-CM | POA: Diagnosis not present

## 2012-05-08 DIAGNOSIS — F411 Generalized anxiety disorder: Secondary | ICD-10-CM

## 2012-05-08 DIAGNOSIS — Z862 Personal history of diseases of the blood and blood-forming organs and certain disorders involving the immune mechanism: Secondary | ICD-10-CM

## 2012-05-08 DIAGNOSIS — I2510 Atherosclerotic heart disease of native coronary artery without angina pectoris: Secondary | ICD-10-CM | POA: Diagnosis not present

## 2012-05-08 DIAGNOSIS — I872 Venous insufficiency (chronic) (peripheral): Secondary | ICD-10-CM

## 2012-05-08 DIAGNOSIS — M1990 Unspecified osteoarthritis, unspecified site: Secondary | ICD-10-CM

## 2012-05-08 DIAGNOSIS — E785 Hyperlipidemia, unspecified: Secondary | ICD-10-CM

## 2012-05-08 DIAGNOSIS — K219 Gastro-esophageal reflux disease without esophagitis: Secondary | ICD-10-CM

## 2012-05-08 DIAGNOSIS — H9201 Otalgia, right ear: Secondary | ICD-10-CM

## 2012-05-08 DIAGNOSIS — D126 Benign neoplasm of colon, unspecified: Secondary | ICD-10-CM

## 2012-05-08 DIAGNOSIS — F329 Major depressive disorder, single episode, unspecified: Secondary | ICD-10-CM

## 2012-05-08 DIAGNOSIS — I10 Essential (primary) hypertension: Secondary | ICD-10-CM | POA: Diagnosis not present

## 2012-05-08 DIAGNOSIS — C179 Malignant neoplasm of small intestine, unspecified: Secondary | ICD-10-CM

## 2012-05-08 DIAGNOSIS — R51 Headache: Secondary | ICD-10-CM

## 2012-05-08 DIAGNOSIS — F419 Anxiety disorder, unspecified: Secondary | ICD-10-CM

## 2012-05-08 LAB — CBC WITH DIFFERENTIAL/PLATELET
Basophils Relative: 0.4 % (ref 0.0–3.0)
Eosinophils Relative: 1.5 % (ref 0.0–5.0)
HCT: 37.2 % (ref 36.0–46.0)
Lymphs Abs: 6 10*3/uL — ABNORMAL HIGH (ref 0.7–4.0)
MCV: 90.6 fl (ref 78.0–100.0)
Monocytes Absolute: 0.7 10*3/uL (ref 0.1–1.0)
Neutro Abs: 5.4 10*3/uL (ref 1.4–7.7)
Platelets: 196 10*3/uL (ref 150.0–400.0)
WBC: 12.3 10*3/uL — ABNORMAL HIGH (ref 4.5–10.5)

## 2012-05-08 LAB — BASIC METABOLIC PANEL
BUN: 30 mg/dL — ABNORMAL HIGH (ref 6–23)
GFR: 34.61 mL/min — ABNORMAL LOW (ref >60.00)
Potassium: 4.6 mEq/L (ref 3.5–5.1)
Sodium: 138 mEq/L (ref 135–145)

## 2012-05-08 LAB — LIPID PANEL
HDL: 45.1 mg/dL (ref >39.00)
VLDL: 41.8 mg/dL — ABNORMAL HIGH (ref 0.0–40.0)

## 2012-05-08 LAB — HEPATIC FUNCTION PANEL
AST: 20 U/L (ref 0–37)
Total Bilirubin: 0.5 mg/dL (ref 0.3–1.2)

## 2012-05-08 LAB — LDL CHOLESTEROL, DIRECT: Direct LDL: 43.1 mg/dL

## 2012-05-08 MED ORDER — HYDROCODONE-ACETAMINOPHEN 5-325 MG PO TABS: 1.0000 | ORAL_TABLET | Freq: Three times a day (TID) | ORAL | Status: DC | PRN

## 2012-05-08 MED ORDER — ACYCLOVIR 200 MG PO CAPS: 200.0000 mg | ORAL_CAPSULE | Freq: Four times a day (QID) | ORAL | Status: DC
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
## 2012-05-08 NOTE — Patient Instructions (Signed)
Today we updated your med list in our EPIC system...    Continue your current medications the same...    We refilled your Hydrocodone...    We wrote a new prescription for Acyclovir to take one cap 4 times daily for the fever blisters...  Today we did your follow up FASTING blood work...    We will contact you w/ the results when available...   Let's get on react w/ our diet...  Call for any questions...  Let's plan a follow up visit in 37mo, sooner if needed for problems.Marland KitchenMarland Kitchen

## 2012-05-10 DIAGNOSIS — H6120 Impacted cerumen, unspecified ear: Secondary | ICD-10-CM | POA: Diagnosis not present

## 2012-05-10 DIAGNOSIS — H60399 Other infective otitis externa, unspecified ear: Secondary | ICD-10-CM | POA: Diagnosis not present

## 2012-05-17 ENCOUNTER — Ambulatory Visit (INDEPENDENT_AMBULATORY_CARE_PROVIDER_SITE_OTHER): Payer: Medicare Other | Admitting: Adult Health

## 2012-05-17 ENCOUNTER — Encounter: Payer: Self-pay | Admitting: Adult Health

## 2012-05-17 VITALS — BP 132/84 | HR 66 | Temp 97.6°F | Ht 66.0 in | Wt 166.0 lb

## 2012-05-17 DIAGNOSIS — E119 Type 2 diabetes mellitus without complications: Secondary | ICD-10-CM

## 2012-05-17 MED ORDER — GLUCOSE BLOOD VI STRP: ORAL_STRIP | Status: DC

## 2012-05-17 MED ORDER — INSULIN GLARGINE 100 UNIT/ML ~~LOC~~ SOLN: 15.0000 [IU] | Freq: Every day | SUBCUTANEOUS | Status: DC

## 2012-05-17 MED ORDER — ONETOUCH ULTRA SYSTEM W/DEVICE KIT: 1.0000 | PACK | Freq: Once | Status: DC

## 2012-05-17 MED ORDER — METFORMIN HCL 1000 MG PO TABS: 500.0000 mg | ORAL_TABLET | Freq: Two times a day (BID) | ORAL | Status: DC

## 2012-05-17 MED ORDER — INSULIN PEN NEEDLE 32G X 6 MM MISC: Status: DC

## 2012-05-17 NOTE — Assessment & Plan Note (Signed)
Uncontrolled DM w/ worsening renal insufficiency  Pt education given , to begin daily glucose checks   Plan  Begin Lantus Insulin 15 units At bedtime   Keep unopened lantus pen in refrig.  Keep opened lantus pen at room temp.  May stop Onglyza -do not take tonight  Decrease Metformin 1000mg  1/2 tab  Twice daily   Low sweet diet.  Check blood sugars daily in am before eating. , call if sugars >250 or <80  Hold amaryl and metformin tonight -may restart tomorrow at recommended dose.  Check blood sugar tonight before bedtime.  Please contact office for sooner follow up if symptoms do not improve or worsen or seek emergency care  follow up in 4 weeks with Dr. Kriste Basque  Or Jaxen Samples

## 2012-05-17 NOTE — Addendum Note (Signed)
Addended by: Boone Master E on: 05/17/2012 05:54 PM   Modules accepted: Orders

## 2012-05-17 NOTE — Progress Notes (Signed)
Subjective:    Patient ID: [employer], female    DOB: 1940/04/24, 72 y.o.   MRN: [id number]  HPI 72 y/o WF         05/17/2012 Acute OV  Returns for follow up for DM  Seen last week for follow up with Dr. Kriste Basque   A1C trending up at 8.8 with worsening scr at 1.6 .  Set up to begin Lantus insulin today .  We discussed her DM w/ pt education on diet and meds.  Insulin instruction with return shot given per pt.  She can not afford Onglyza . She meets her gap insurance mid year with large out of pocket costs We discussed decreasing some of her to help with cost since we are beginning insulin therapy BS today is 243 in office.  She denies chest pain, edema polyuria, polydipsia.  Not checking blood sugars .     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 denies CP, palpit, syncope, edema, etc. ~  Myoview 12/08 showed perfusion defect w/ infarct/ scar & perinfarct ischemia... ~  subseq Cath 12/08 showed progressive CIRC dis & patent grafts- med Rx... ~  seen by [name]/ [facility] 9/10 w/ Myoview & 2DEcho- cleared for ventral hernia surg. ~  she continues regular f/u w/ [name], [facility]... his notes are reviewed.  HYPERTENSION (ICD-401.9) - on ATENOLOL 50mg /d, ALTACE 10mg /d, HCTZ 25mg /d... BP= 140/76 & taking meds regularly... denies HA, visual changes, CP, palipit, syncope, dyspnea, edema, etc...  ~  Last CXR 9/10 showed prev CABG, normal heart size, clear lungs...  VENOUS INSUFFICIENCY (ICD-459.81) - on low sodium diet, elevates legs, wear support hose when able...  HYPERLIPIDEMIA (ICD-272.4) - now on CRESTOR 10mg /d & NIASPAN 500mg /d> takes her ASA prior to the Niacin Qhs; prev on Lip20 (stopped due to $$) & Simva40 (stopped due to myalgias)... ~  FLP 6/08 on Lip20 showed TChol 118, TG 155, HDL 41, LDL 46 ~  FLP 12/08 on Lip20 showed TChol 129, TG 201, HDL 37, LDL 56 ~  FLP 6/09  on Lip20 showed TChol 113, TG 162, HDL 34, LDL 47 ~  FLP 11/10 on Simva40+Niasp500 showed TChol 183, TG 347, HDL 49, LDL 72 ~  FLP 5/11 on Simva40+Niasp500 showed TChol 147, TG 307, HDL 40, LDL 49... rec better low fat diet. ~  6/12:  She tells me that [name] stopped her Simva40 due to ?myopathy; she states feeling better off statin. ~  FLP 2/13 on Cres10+Niasp500 showed TChol 122, TG 227, HDL 51, LDL 40... Needs better low fat diet.  DM (ICD-250.00) - on METFORMIN 1000mg Bid, AMARYL 4mg /d, & ONGL?YZA 5mg /d... she refuses Januvia ($$), and Actos (worried about bladder cancer). ~  labs 6/08 showed BS 110, HgA1C 6.6 ~  labs 12/08 showed BS= 170, HgA1c= 7.2 ~  labs 6/09 showed BS= 210, A1c= 8.0.Marland Kitchen. rec> add Actos 15mg /d but pt never did due to $$. ~  labs 11/10 showed BS= 189, A1c= 7.6 ~  she refuses Januvia- "it made my sugar go higher" ~  5/11> she is asking to restart ACTOS 30mg /d because home BS= 150-200 range. ~  labs 5/11 showed BS= 162, A1c= 7.4... later refused Actos due to risk of bladder cancer. ~  labs 11/11 by [facility] showed BS= 117, A1c= 7.4.Marland KitchenMarland Kitchen rec to continue Metform & Glimep. ~  Labs 6/12 showed BS= 207, A1c= 9.4.Marland KitchenMarland Kitchen rec to continue Metform, incr Glimep4mg Bid &  add Onglyza5mg /d. ~  Labs 10/12 showed BS= 145, A1c= 7.6.Marland KitchenMarland Kitchen Continue same, get wt down... ~  Labs 2/13showed BS= 171, A1c= 7.6.Marland KitchenMarland Kitchen Continue same, get wt down.  GERD (ICD-530.81) - prev on Prilosec OTC Prn- rec ch to OMEPRAZOLE 40mg /d before dinner... ~  last EGD 12/06 by DrDBrodie showed mild antral gastritis, RUT neg, ?from ASA... Rx'd Pepcid 40mg /d. ~  May 20, 2022:  c/o indigestion in eve> rec Omep40 before dinner & antireflux regimen... f/u w/ GI. ~  EGD 6/11 by DrDBrodie w/ antral gastritis & retained food, neg HPylori- Rx'd PPI.  COLONIC POLYPS (ICD-211.3) & Prob IBS >> ~  colonoscopy 12/06 by DrDBrodie showed 8mm polyp in sigmoid area= ?path not avail... ~  colonoscopy 6/11 showed 2 polyps & int hems, bx= tub adenomas & f/u planned  54yrs. ~  8/12:  Symptoms suggesting IBS & we discussed fiber, and trial BENTYL 20mg  Tid as needed for cramping pain...  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 post op oncology DrShinn w/ adjuvant chemotherapy- 5-FU/ Leucovorin x 26weeks finished 12/00... ~  she has been followed by DrDBrodie since DrShinn left [place]... ~  small bowel capsule endoscopy 1/07 was neg- no recurrent tumor seen, 2 tiny AVM's noted... ~  CT Abd/ Pelvis 8/10 w/ large ventral hernia containing transverse colon & mesentery- no obstruction, ?hep steatosis, lumbar DJD. ~  UGI & Sm Bowel Series 9/10 showed no HH or reflux seem, ventral hernia w/ transverse colon, no obstruction or mass in sm bowel. HERNIA, VENTRAL (ICD-553.20) << see 9/10 Hosp >> lap ventral hernia repair w/ reduction incarcerated colon by DrNewman.  DEGENERATIVE JOINT DISEASE (ICD-715.90) - she is s/p left knee arthroscopy 05-20-1994... ~  labs 05-20-22 showed Vit D level = 43  HEADACHE (ICD-784.0) - hx mixed HA's in the past...  ANXIETY & DEPRESSION (ICD-311) - husb passed away in May 20, 2002... she has CHLORAZEPATE 7.5mg  as needed... ~  11/10: pt requesting LEXAPRO 10mg /d... in f/u she notes improvement & decided to stop Rx.  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 ~  labs 11/10 showed Hg= 11.8 ~  labs 2022-05-20 showed Hg= 11.5, MCV= 93, Fe= 93 (24%sat), B12= 250 (DrDBrodie started B12 IM monthly. ~  labs 11/11 by [facility] showed Hg= 12.2, B12= 544 & DrBrodie continued the monthly shots. ~  Labs 5/12 showed B12= 465 & pt indicates that DrBrodie stopped the B12 shots... ~  6/12:  I reviewed her  chart & discussed w/ pt> we decided to Rx w/ OTC Women's Formula MVI AND Vit B12 1058mcg/d orally for now... ~  Labs 2/13 showed Hg= 13.0, MCV= 93, VitB12= 510... Ok to continue oral B12 supplement...   Past Surgical History  Procedure Laterality Date  . Cabg x 5  1997  . Cesarean section    . Left knee surgery    . Tonsillectomy    . Tubal ligation    . 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 Samuella Cota  . Lap ventral hernia repair with incarcerated colon  09/2008    Dr. Ezzard Standing    Outpatient Encounter Prescriptions as of 05/17/2012  Medication Sig Dispense Refill  . acyclovir (ZOVIRAX)  200 MG capsule Take 1 capsule (200 mg total) by mouth 4 (four) times daily. As directed  100 capsule  5  . aspirin 325 MG tablet Take 325 mg by mouth daily.      Marland Kitchen atenolol (TENORMIN) 50 MG tablet TAKE 1 TABLET BY MOUTH EVERY DAY  90 tablet  0  . cholecalciferol (VITAMIN D) 1000 UNITS tablet Take 1,000 Units by mouth daily.        . clopidogrel (PLAVIX) 75 MG tablet Take 1 tablet (75 mg total) by mouth daily.  90 tablet  3  . clorazepate (TRANXENE) 7.5 MG tablet Take 1 tablet (7.5 mg total) by mouth as needed.  30 tablet  5  . dicyclomine (BENTYL) 20 MG tablet Take 1 tablet (20 mg total) by mouth 3 (three) times daily as needed (for abdominal cramping).  50 tablet  5  . glimepiride (AMARYL) 4 MG tablet Take 1 tablet (4 mg total) by mouth 2 (two) times daily.  180 tablet  3  . hydrochlorothiazide (HYDRODIURIL) 25 MG tablet Take 12.5 mg by mouth daily.       Marland Kitchen HYDROcodone-acetaminophen (NORCO/VICODIN) 5-325 MG per tablet Take 1 tablet by mouth 3 (three) times daily as needed for pain.  90 tablet  5  . metFORMIN (GLUCOPHAGE) 1000 MG tablet TAKE 1 TABLET BY MOUTH TWICE DAILY  180 tablet  0  . Multiple Vitamin (MULTIVITAMIN) capsule Take 1 capsule by mouth daily.        Marland Kitchen neomycin-polymyxin-hydrocortisone (CORTISPORIN) otic solution 2-3 drops in each ear three times daily  10 mL  2  . NIASPAN 500 MG CR  tablet TAKE 1 TABLET BY MOUTH EVERY DAY  90 tablet  2  . omeprazole (PRILOSEC) 40 MG capsule Take 1 capsule (40 mg total) by mouth daily.  90 capsule  3  . ramipril (ALTACE) 10 MG capsule TAKE ONE CAPSULE BY MOUTH DAILY  90 capsule  0  . rosuvastatin (CRESTOR) 10 MG tablet Take 1 tablet (10 mg total) by mouth daily.  21 tablet  0  . saxagliptin HCl (ONGLYZA) 5 MG TABS tablet Take 1 tablet (5 mg total) by mouth daily.  21 tablet  0   No facility-administered encounter medications on file as of 05/17/2012.    Allergies  Allergen Reactions  . Codeine     REACTION: makes her nervous  . Ibuprofen     REACTION: nervous  . Meperidine Hcl     REACTION: nasuea and vomitting  . Naproxen Sodium     REACTION: nervous    Review of Systems       Constitutional:   No  weight loss, night sweats,  Fevers, chills, +fatigue, or  lassitude.  HEENT:   No headaches,  Difficulty swallowing,  Tooth/dental problems, or  Sore throat,                No sneezing, itching, ear ache, nasal congestion, post nasal drip,   CV:  No chest pain,  Orthopnea, PND, swelling in lower extremities, anasarca, dizziness, palpitations, syncope.   GI  No heartburn, indigestion, abdominal pain, nausea, vomiting, diarrhea, change in bowel habits, loss of appetite, bloody stools.   Resp:   No coughing up of blood.   No chest wall deformity  Skin: no rash or lesions.  GU: no dysuria, change in color of urine, no urgency or frequency.  No flank pain, no hematuria   MS:  No joint pain or swelling.  No decreased range of motion.  No back pain.  Psych:  No change in mood or affect. No depression or anxiety.  No memory loss.       Objective:   Physical Exam      WD, WN, 72  y/o WF in NAD... GENERAL:  Alert & oriented; pleasant & cooperative... HEENT:  [place]/AT, NECK:  Supple w/ adeq ROM; no JVD; normal carotid impulses w/o bruits; no thyromegaly or nodules palpated; no lymphadenopathy. CHEST:  CTA   without wheezes/  rales/ or rhonchi heard... HEART:  Regular Rhythm; without murmurs/ rubs/ or gallops detected... ABDOMEN: scar of prev surg,   normal bowel sounds;     EXT:  , no varicose veins/ venous insuffic/ or edema. NEURO:    gait normal & balance OK. DERM:  No lesions noted;    Assessment & Plan:

## 2012-05-17 NOTE — Patient Instructions (Addendum)
Begin Lantus Insulin 15 units At bedtime   Keep unopened lantus pen in refrig.  Keep opened lantus pen at room temp.  May stop Onglyza -do not take tonight  Decrease Metformin 1000mg  1/2 tab  Twice daily   Low sweet diet.  Check blood sugars daily in am before eating. , call if sugars >250 or <80  Hold amaryl and metformin tonight -may restart tomorrow at recommended dose.  Check blood sugar tonight before bedtime.  Please contact office for sooner follow up if symptoms do not improve or worsen or seek emergency care  follow up in 4 weeks with Dr. Kriste Basque  Or Halima Fogal

## 2012-06-19 ENCOUNTER — Encounter: Payer: Self-pay | Admitting: Adult Health

## 2012-06-19 ENCOUNTER — Other Ambulatory Visit (INDEPENDENT_AMBULATORY_CARE_PROVIDER_SITE_OTHER): Payer: Medicare Other

## 2012-06-19 ENCOUNTER — Ambulatory Visit (INDEPENDENT_AMBULATORY_CARE_PROVIDER_SITE_OTHER): Payer: Medicare Other | Admitting: Adult Health

## 2012-06-19 VITALS — BP 122/80 | HR 61 | Temp 97.5°F | Ht 65.5 in | Wt 166.0 lb

## 2012-06-19 DIAGNOSIS — E119 Type 2 diabetes mellitus without complications: Secondary | ICD-10-CM | POA: Diagnosis not present

## 2012-06-19 LAB — BASIC METABOLIC PANEL
BUN: 30 mg/dL — ABNORMAL HIGH (ref 6–23)
CO2: 24 mEq/L (ref 19–32)
Calcium: 9.7 mg/dL (ref 8.4–10.5)
Glucose, Bld: 260 mg/dL — ABNORMAL HIGH (ref 70–99)
Potassium: 4.3 mEq/L (ref 3.5–5.1)
Sodium: 140 mEq/L (ref 135–145)

## 2012-06-19 MED ORDER — ONETOUCH DELICA LANCETS 33G MISC: 1.0000 | Freq: Every morning | Status: DC

## 2012-06-19 MED ORDER — DICYCLOMINE HCL 20 MG PO TABS: 20.0000 mg | ORAL_TABLET | Freq: Three times a day (TID) | ORAL | Status: DC | PRN

## 2012-06-19 MED ORDER — CLORAZEPATE DIPOTASSIUM 7.5 MG PO TABS: 7.5000 mg | ORAL_TABLET | ORAL | Status: DC | PRN

## 2012-06-19 MED ORDER — INSULIN GLARGINE 100 UNIT/ML SOLOSTAR PEN: 25.0000 [IU] | PEN_INJECTOR | Freq: Every evening | SUBCUTANEOUS | Status: DC | PRN

## 2012-06-19 MED ORDER — INSULIN GLARGINE 100 UNIT/ML ~~LOC~~ SOLN: 25.0000 [IU] | Freq: Every day | SUBCUTANEOUS | Status: DC

## 2012-06-19 NOTE — Patient Instructions (Addendum)
Increase Lantus Insulin 25 units At bedtime   Keep unopened lantus pen in refrig.  Keep opened lantus pen at room temp.  Low sweet diet.  Check blood sugars daily in am before eating. , call if sugars >250 or <80  Please contact office for sooner follow up if symptoms do not improve or worsen or seek emergency care  Follow up in 2 months weeks with Dr. Kriste Basque    Late add:  BS 260 on bmet  Advised pt to increase Lantus 2 units every 3 d until BS consistently <200. Pt aware

## 2012-06-21 ENCOUNTER — Telehealth: Payer: Self-pay | Admitting: *Deleted

## 2012-06-21 NOTE — Progress Notes (Signed)
Quick Note:  Pt aware of lab results per call from TP See 6.20.14 phone note for details regarding the Lantus ______

## 2012-06-21 NOTE — Progress Notes (Signed)
Subjective:    Patient ID: [employer], female    DOB: [birth year], 72 y.o.   MRN: [id number]  HPI 72 y/o WF         05/17/12 Acute OV  Returns for follow up for DM  Seen last week for follow up with Dr. Kriste Basque   A1C trending up at 8.8 with worsening scr at 1.6 .  Set up to begin Lantus insulin today .  We discussed her DM w/ pt education on diet and meds.  Insulin instruction with return shot given per pt.  She can not afford Onglyza . She meets her gap insurance mid year with large out of pocket costs We discussed decreasing some of her to help with cost since we are beginning insulin therapy BS today is 243 in office.  She denies chest pain, edema polyuria, polydipsia.  Not checking blood sugars .  >>begin Lantus 15u , stop onglyza , metformin decreased 500mg  Twice daily    06/19/12 Follow up DM  4 week follow up new insulin start - reports BS have been running from 190-265.  has been doing 15units of lantus QHS. Last visit unable to afford Onglyza. A1C tr up at 8.8.  She was started on Lantus at 15 units daily  Says she is still seeing sugars ~200s .  No low sugars.  No polyuria/polydipsia.   . Metformin was decreased 500mg  Twice daily  Last visit.  Scr improved today at 1.4 No chest pain or edema.   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 denies CP, palpit, syncope, edema, etc. ~  Myoview 12/08 showed perfusion defect w/ infarct/ scar & perinfarct ischemia... ~  subseq Cath 12/08 showed progressive CIRC dis & patent grafts- med Rx... ~  seen by [name]/ [facility] 9/10 w/ Myoview & 2DEcho- cleared for ventral hernia surg. ~  she continues regular f/u w/ [name], [facility]... his notes are reviewed.  HYPERTENSION (ICD-401.9) - on ATENOLOL 50mg /d, ALTACE 10mg /d, HCTZ 25mg /d... BP= 140/76 & taking meds regularly... denies HA, visual changes, CP, palipit, syncope,  dyspnea, edema, etc...  ~  Last CXR 9/10 showed prev CABG, normal heart size, clear lungs...  VENOUS INSUFFICIENCY (ICD-459.81) - on low sodium diet, elevates legs, wear support hose when able...  HYPERLIPIDEMIA (ICD-272.4) - now on CRESTOR 10mg /d & NIASPAN 500mg /d> takes her ASA prior to the Niacin Qhs; prev on Lip20 (stopped due to $$) & Simva40 (stopped due to myalgias)... ~  FLP 6/08 on Lip20 showed TChol 118, TG 155, HDL 41, LDL 46 ~  FLP 12/08 on Lip20 showed TChol 129, TG 201, HDL 37, LDL 56 ~  FLP 6/09 on Lip20 showed TChol 113, TG 162, HDL 34, LDL 47 ~  FLP 11/10 on Simva40+Niasp500 showed TChol 183, TG 347, HDL 49, LDL 72 ~  FLP 5/11 on Simva40+Niasp500 showed TChol 147, TG 307, HDL 40, LDL 49... rec better low fat diet. ~  6/12:  She tells me that [name] stopped her Simva40 due to ?myopathy; she states feeling better off statin. ~  FLP 2/13 on Cres10+Niasp500 showed TChol 122, TG 227, HDL 51, LDL 40... Needs better low fat diet.  DM (ICD-250.00) - on METFORMIN 1000mg Bid, AMARYL 4mg /d, & ONGL?YZA 5mg /d... she refuses Januvia ($$), and Actos (worried about bladder cancer). ~  labs 6/08 showed BS 110, HgA1C 6.6 ~  labs 12/08 showed BS= 170, HgA1c= 7.2 ~  labs  6/09 showed BS= 210, A1c= 8.0.Marland Kitchen. rec> add Actos 15mg /d but pt never did due to $$. ~  labs 11/10 showed BS= 189, A1c= 7.6 ~  she refuses Januvia- "it made my sugar go higher" ~  06-06-22 she is asking to restart ACTOS 30mg /d because home BS= 150-200 range. ~  labs 2022/06/06 showed BS= 162, A1c= 7.4... later refused Actos due to risk of bladder cancer. ~  labs 11/11 by [facility] showed BS= 117, A1c= 7.4.Marland KitchenMarland Kitchen rec to continue Metform & Glimep. ~  Labs 6/12 showed BS= 207, A1c= 9.4.Marland KitchenMarland Kitchen rec to continue Metform, incr Glimep4mg Bid & add Onglyza5mg /d. ~  Labs 10/12 showed BS= 145, A1c= 7.6.Marland KitchenMarland Kitchen Continue same, get wt down... ~  Labs 2/13showed BS= 171, A1c= 7.6.Marland KitchenMarland Kitchen Continue same, get wt down.  GERD (ICD-530.81) - prev on Prilosec OTC Prn- rec ch to  OMEPRAZOLE 40mg /d before dinner... ~  last EGD 12/06 by DrDBrodie showed mild antral gastritis, RUT neg, ?from ASA... Rx'd Pepcid 40mg /d. ~  2022-06-06:  c/o indigestion in eve> rec Omep40 before dinner & antireflux regimen... f/u w/ GI. ~  EGD 6/11 by DrDBrodie w/ antral gastritis & retained food, neg HPylori- Rx'd PPI.  COLONIC POLYPS (ICD-211.3) & Prob IBS >> ~  colonoscopy 12/06 by DrDBrodie showed 8mm polyp in sigmoid area= ?path not avail... ~  colonoscopy 6/11 showed 2 polyps & int hems, bx= tub adenomas & f/u planned 7yrs. ~  8/12:  Symptoms suggesting IBS & we discussed fiber, and trial BENTYL 20mg  Tid as needed for cramping pain...  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 post op oncology DrShinn w/ adjuvant chemotherapy- 5-FU/ Leucovorin x 26weeks finished 12/00... ~  she has been followed by DrDBrodie since DrShinn left [place]... ~  small bowel capsule endoscopy 1/07 was neg- no recurrent tumor seen, 2 tiny AVM's noted... ~  CT Abd/ Pelvis 8/10 w/ large ventral hernia containing transverse colon & mesentery- no obstruction, ?hep steatosis, lumbar DJD. ~  UGI & Sm Bowel Series 9/10 showed no HH or reflux seem, ventral hernia w/ transverse colon, no obstruction or mass in sm bowel. HERNIA, VENTRAL (ICD-553.20) << see 9/10 Hosp >> lap ventral hernia repair w/ reduction incarcerated colon by DrNewman.  DEGENERATIVE JOINT DISEASE (ICD-715.90) - she is s/p left knee arthroscopy 06-06-94... ~  labs 06-06-2022 showed Vit D level = 43  HEADACHE (ICD-784.0) - hx mixed HA's in the past...  ANXIETY & DEPRESSION (ICD-311) - husb passed away in 06-06-2002... she has CHLORAZEPATE 7.5mg  as needed... ~  11/10: pt requesting LEXAPRO 10mg /d... in f/u she notes  improvement & decided to stop Rx.  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 ~  labs 11/10 showed Hg= 11.8 ~  labs 06-Jun-2022 showed Hg= 11.5, MCV= 93, Fe= 93 (24%sat), B12= 250 (DrDBrodie started B12 IM monthly. ~  labs 11/11 by [facility] showed Hg= 12.2, B12= 544 & DrBrodie continued the monthly shots. ~  Labs 5/12 showed B12= 465 & pt indicates that DrBrodie stopped the B12 shots... ~  6/12:  I reviewed her chart & discussed w/ pt> we decided to Rx w/ OTC Women's Formula MVI AND Vit B12 1027mcg/d orally for now... ~  Labs 2/13 showed Hg= 13.0, MCV= 93, VitB12= 510... Ok to continue oral B12 supplement...   Past Surgical History  Procedure Laterality Date  . Cabg x 5  1997  . Cesarean section    . Left knee surgery    . Tonsillectomy    . Tubal ligation    . 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 Samuella Cota  . Lap ventral hernia repair with incarcerated colon  09/2008    Dr. Ezzard Standing    Outpatient Encounter Prescriptions as of 06/19/2012  Medication Sig Dispense Refill  . acyclovir (ZOVIRAX) 200 MG capsule Take 1 capsule (200 mg total) by mouth 4 (four) times daily. As directed  100 capsule  5  . aspirin 325 MG tablet Take 325 mg by mouth daily.      Marland Kitchen atenolol (TENORMIN) 50 MG tablet TAKE 1 TABLET BY MOUTH EVERY DAY  90 tablet  0  . Blood Glucose Monitoring Suppl (ONE TOUCH ULTRA SYSTEM KIT) W/DEVICE KIT 1 kit by Does not apply route once.  1 each  0  . cholecalciferol (VITAMIN D) 1000 UNITS tablet Take 1,000 Units by mouth daily.        . clopidogrel (PLAVIX) 75 MG tablet Take 1 tablet (75 mg total) by mouth daily.  90 tablet  3  . clorazepate (TRANXENE) 7.5 MG tablet Take 1 tablet (7.5 mg total) by mouth as needed.  30 tablet  5  . glimepiride (AMARYL) 4 MG tablet Take 1 tablet (4 mg total) by mouth 2 (two) times daily.  180 tablet  3  . glucose blood test strip  Use as instructed  100 each  12  . hydrochlorothiazide (HYDRODIURIL) 25 MG tablet Take 12.5 mg by mouth daily.       Marland Kitchen HYDROcodone-acetaminophen (NORCO/VICODIN) 5-325 MG per tablet Take 1 tablet by mouth 3 (three) times daily as needed for pain.  90 tablet  5  . Insulin Glargine (LANTUS SOLOSTAR) 100 UNIT/ML SOPN Inject 25 Units into the skin at bedtime and may repeat dose one time if needed.  3 mL  12  . Insulin Pen Needle (NOVOFINE) 32G X 6 MM MISC Use as directed to check blood sugar  100 each  6  . metFORMIN (GLUCOPHAGE) 1000 MG tablet Take 0.5 tablets (500 mg total) by mouth 2 (two) times daily with a meal.      . Multiple Vitamin (MULTIVITAMIN) capsule Take 1 capsule by mouth daily.        Marland Kitchen neomycin-polymyxin-hydrocortisone (CORTISPORIN) otic solution 2-3 drops in each ear three times daily  10 mL  2  . NIASPAN 500 MG CR tablet TAKE 1 TABLET BY MOUTH EVERY DAY  90 tablet  2  . omeprazole (PRILOSEC) 40 MG capsule Take 1 capsule (40 mg total) by mouth daily.  90 capsule  3  . ramipril (ALTACE) 10 MG capsule TAKE ONE CAPSULE BY MOUTH DAILY  90 capsule  0  . rosuvastatin (CRESTOR) 10 MG tablet Take 1 tablet (10 mg total) by mouth daily.  21 tablet  0  . [DISCONTINUED] clorazepate (TRANXENE) 7.5 MG tablet Take 1 tablet (7.5 mg total) by mouth as needed.  30 tablet  5  . [DISCONTINUED] dicyclomine (BENTYL) 20 MG tablet Take 1 tablet (20 mg total) by mouth 3 (three) times daily as needed (for abdominal cramping).  50 tablet  5  . [DISCONTINUED] insulin glargine (LANTUS) 100 UNIT/ML injection Inject 0.25 mLs (25 Units total) into the skin at bedtime.  10 mL  12  . dicyclomine (BENTYL) 20 MG tablet Take 1 tablet (20 mg total) by mouth 3 (three) times daily as needed (for abdominal cramping).  50 tablet  5  . ONETOUCH DELICA LANCETS 33G MISC 1 Device by Does not apply route every morning.  100 each  12  . [DISCONTINUED] insulin glargine (LANTUS) 100 UNIT/ML injection Inject 0.15 mLs (15 Units total)  into the skin at bedtime.  10 mL  12  . [DISCONTINUED] saxagliptin HCl (ONGLYZA) 5 MG TABS tablet Take 1 tablet (5 mg total) by mouth daily.  21 tablet  0   No facility-administered encounter medications on file as of 06/19/2012.    Allergies  Allergen Reactions  . Codeine     REACTION: makes her nervous  . Ibuprofen     REACTION: nervous  . Meperidine Hcl     REACTION: nasuea and vomitting  . Naproxen Sodium     REACTION: nervous    Review of Systems       Constitutional:   No  weight loss, night sweats,  Fevers, chills, +fatigue, or  lassitude.  HEENT:   No headaches,  Difficulty swallowing,  Tooth/dental problems, or  Sore throat,                No sneezing, itching, ear ache, nasal congestion, post nasal drip,   CV:  No chest pain,  Orthopnea, PND, swelling in lower extremities, anasarca, dizziness, palpitations, syncope.   GI  No heartburn, indigestion, abdominal pain, nausea, vomiting, diarrhea, change in bowel habits, loss of appetite, bloody stools.   Resp:   No coughing up of blood.   No chest wall deformity  Skin: no rash or lesions.  GU: no dysuria, change in color of urine, no urgency or frequency.  No flank pain, no hematuria   MS:  No joint pain or swelling.  No decreased range of motion.  No back pain.  Psych:  No change in mood or affect. No depression or anxiety.  No memory loss.       Objective:   Physical Exam      WD, WN, 72  y/o WF in NAD... GENERAL:  Alert & oriented; pleasant & cooperative... HEENT:  [place]/AT, NECK:  Supple w/ adeq ROM; no JVD; normal carotid impulses w/o bruits; no thyromegaly or nodules palpated; no lymphadenopathy. CHEST:  CTA   without wheezes/ rales/ or rhonchi heard... HEART:  Regular Rhythm; without murmurs/ rubs/ or gallops detected... ABDOMEN: scar of prev surg,   normal bowel sounds;     EXT:  , no varicose veins/ venous insuffic/ or edema. NEURO:    gait normal & balance OK. DERM:  No lesions noted;    Assessment  & Plan:

## 2012-06-21 NOTE — Telephone Encounter (Signed)
Message copied by Lowell Bouton on Fri Jun 21, 2012  5:24 PM ------      Message from: Julio Sicks      Created: Fri Jun 21, 2012  1:38 PM       Please call pharm Jess      Lantus pen is too expensive for pt.       Check to see if vials are cheaper        ------

## 2012-06-21 NOTE — Assessment & Plan Note (Signed)
Not at goal , BS still tr in 200s  Diet discussed  Cost is issue wth rx.    Plan  Increase Lantus Insulin 25 units At bedtime   Keep unopened lantus pen in refrig.  Keep opened lantus pen at room temp.  Low sweet diet.  Check blood sugars daily in am before eating. , call if sugars >250 or <80  Please contact office for sooner follow up if symptoms do not improve or worsen or seek emergency care  Follow up in 2 months weeks with Dr. Kriste Basque

## 2012-06-21 NOTE — Telephone Encounter (Signed)
[facility] [employer], spoke with pharmacist Riley Lam Per pharmacist, pt's copay was $156 for 5 solostar pens Unable to run a script for insulin vials as it is too soon > pt can either call number on the back of her insurance card or we can try this again when it's time for a refill  LMOM TCB x1 for patient to discuss this with her; would probably be best if she calls me when time for her next refill

## 2012-06-27 NOTE — Telephone Encounter (Signed)
Pt returned call. [name]  

## 2012-06-28 NOTE — Telephone Encounter (Signed)
Called spoke with patient She will call here when close to time for Lantus refill so that we may see if vials are more affordable than the solostar pens Pt lost her AVS Discussed with her again how to increase her lantus as directed w/ TP and written on AVS AVS mailed to pt's home address Will sign off

## 2012-07-02 ENCOUNTER — Other Ambulatory Visit: Payer: Self-pay | Admitting: Pulmonary Disease

## 2012-07-11 ENCOUNTER — Other Ambulatory Visit: Payer: Self-pay | Admitting: Pulmonary Disease

## 2012-07-12 ENCOUNTER — Other Ambulatory Visit: Payer: Self-pay | Admitting: *Deleted

## 2012-07-12 DIAGNOSIS — Z950 Presence of cardiac pacemaker: Secondary | ICD-10-CM

## 2012-07-12 DIAGNOSIS — E782 Mixed hyperlipidemia: Secondary | ICD-10-CM | POA: Diagnosis not present

## 2012-07-12 DIAGNOSIS — I4510 Unspecified right bundle-branch block: Secondary | ICD-10-CM | POA: Diagnosis not present

## 2012-07-12 DIAGNOSIS — I10 Essential (primary) hypertension: Secondary | ICD-10-CM | POA: Diagnosis not present

## 2012-07-12 DIAGNOSIS — R0989 Other specified symptoms and signs involving the circulatory and respiratory systems: Secondary | ICD-10-CM

## 2012-07-18 ENCOUNTER — Ambulatory Visit (HOSPITAL_COMMUNITY)
Admission: RE | Admit: 2012-07-18 | Discharge: 2012-07-18 | Disposition: A | Discharge status: Home / Self Care | Payer: Medicare Other | Source: Ambulatory Visit | Attending: Cardiovascular Disease | Admitting: Cardiovascular Disease

## 2012-07-18 DIAGNOSIS — E785 Hyperlipidemia, unspecified: Secondary | ICD-10-CM | POA: Diagnosis not present

## 2012-07-18 DIAGNOSIS — I10 Essential (primary) hypertension: Secondary | ICD-10-CM | POA: Diagnosis not present

## 2012-07-18 DIAGNOSIS — E119 Type 2 diabetes mellitus without complications: Secondary | ICD-10-CM | POA: Insufficient documentation

## 2012-07-18 DIAGNOSIS — K219 Gastro-esophageal reflux disease without esophagitis: Secondary | ICD-10-CM | POA: Insufficient documentation

## 2012-07-18 DIAGNOSIS — I2510 Atherosclerotic heart disease of native coronary artery without angina pectoris: Secondary | ICD-10-CM | POA: Diagnosis not present

## 2012-07-18 DIAGNOSIS — R0989 Other specified symptoms and signs involving the circulatory and respiratory systems: Secondary | ICD-10-CM | POA: Insufficient documentation

## 2012-07-18 DIAGNOSIS — R011 Cardiac murmur, unspecified: Secondary | ICD-10-CM | POA: Diagnosis not present

## 2012-07-18 DIAGNOSIS — Z950 Presence of cardiac pacemaker: Secondary | ICD-10-CM

## 2012-07-18 NOTE — Progress Notes (Signed)
2D Echo Performed 07/18/2012    Clearence Ped, RCS

## 2012-07-18 NOTE — Progress Notes (Signed)
Carotid Duplex Completed. Jenni Thew, RDMS, RVT  

## 2012-08-21 ENCOUNTER — Other Ambulatory Visit (INDEPENDENT_AMBULATORY_CARE_PROVIDER_SITE_OTHER): Payer: Medicare Other

## 2012-08-21 ENCOUNTER — Ambulatory Visit (INDEPENDENT_AMBULATORY_CARE_PROVIDER_SITE_OTHER): Payer: Medicare Other | Admitting: Pulmonary Disease

## 2012-08-21 ENCOUNTER — Encounter: Payer: Self-pay | Admitting: Pulmonary Disease

## 2012-08-21 VITALS — BP 144/80 | HR 54 | Temp 97.7°F | Ht 66.0 in | Wt 165.2 lb

## 2012-08-21 DIAGNOSIS — F341 Dysthymic disorder: Secondary | ICD-10-CM

## 2012-08-21 DIAGNOSIS — I872 Venous insufficiency (chronic) (peripheral): Secondary | ICD-10-CM | POA: Diagnosis not present

## 2012-08-21 DIAGNOSIS — I2510 Atherosclerotic heart disease of native coronary artery without angina pectoris: Secondary | ICD-10-CM

## 2012-08-21 DIAGNOSIS — E119 Type 2 diabetes mellitus without complications: Secondary | ICD-10-CM | POA: Diagnosis not present

## 2012-08-21 DIAGNOSIS — I10 Essential (primary) hypertension: Secondary | ICD-10-CM

## 2012-08-21 DIAGNOSIS — F329 Major depressive disorder, single episode, unspecified: Secondary | ICD-10-CM

## 2012-08-21 DIAGNOSIS — E785 Hyperlipidemia, unspecified: Secondary | ICD-10-CM

## 2012-08-21 DIAGNOSIS — K219 Gastro-esophageal reflux disease without esophagitis: Secondary | ICD-10-CM

## 2012-08-21 DIAGNOSIS — N289 Disorder of kidney and ureter, unspecified: Secondary | ICD-10-CM

## 2012-08-21 DIAGNOSIS — M1990 Unspecified osteoarthritis, unspecified site: Secondary | ICD-10-CM

## 2012-08-21 LAB — BASIC METABOLIC PANEL
BUN: 25 mg/dL — ABNORMAL HIGH (ref 6–23)
Creatinine, Ser: 1.7 mg/dL — ABNORMAL HIGH (ref 0.4–1.2)
GFR: 31.97 mL/min — ABNORMAL LOW (ref >60.00)
Potassium: 4.9 mEq/L (ref 3.5–5.1)

## 2012-08-21 LAB — CBC WITH DIFFERENTIAL/PLATELET
Basophils Relative: 0.4 % (ref 0.0–3.0)
Eosinophils Relative: 1.3 % (ref 0.0–5.0)
Monocytes Relative: 5.8 % (ref 3.0–12.0)
Neutrophils Relative %: 43.4 % (ref 43.0–77.0)
Platelets: 226 10*3/uL (ref 150.0–400.0)
RBC: 4.01 Mil/uL (ref 3.87–5.11)
WBC: 11.9 10*3/uL — ABNORMAL HIGH (ref 4.5–10.5)

## 2012-08-21 LAB — HEMOGLOBIN A1C: Hgb A1c MFr Bld: 8.9 % — ABNORMAL HIGH (ref 4.6–6.5)

## 2012-08-21 NOTE — Patient Instructions (Addendum)
Today we updated your med list in our EPIC system...    Continue your current medications the same...  We decided to increase the LANTUS insulin to 30 units daily...    Continue the Metformin & Glimepiride the same...   Today we rechecked your Diabetic labs and a blood count...    We will contact you w/ the results when available...   Remember to stay on the low carb diabetic diet...  Call for any questions...  Let's plan a follow up visit in 3-70mo, sooner if needed for problems.Marland KitchenMarland Kitchen
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
## 2012-10-08 ENCOUNTER — Encounter: Payer: Self-pay | Admitting: Cardiovascular Disease

## 2012-11-01 ENCOUNTER — Other Ambulatory Visit: Payer: Self-pay | Admitting: Pulmonary Disease

## 2012-11-12 ENCOUNTER — Other Ambulatory Visit (INDEPENDENT_AMBULATORY_CARE_PROVIDER_SITE_OTHER): Payer: Medicare Other

## 2012-11-12 ENCOUNTER — Encounter: Payer: Self-pay | Admitting: Pulmonary Disease

## 2012-11-12 ENCOUNTER — Ambulatory Visit (INDEPENDENT_AMBULATORY_CARE_PROVIDER_SITE_OTHER): Payer: Medicare Other | Admitting: Pulmonary Disease

## 2012-11-12 VITALS — BP 128/62 | HR 59 | Temp 97.8°F | Ht 66.0 in | Wt 166.8 lb

## 2012-11-12 DIAGNOSIS — E119 Type 2 diabetes mellitus without complications: Secondary | ICD-10-CM

## 2012-11-12 DIAGNOSIS — F419 Anxiety disorder, unspecified: Secondary | ICD-10-CM

## 2012-11-12 DIAGNOSIS — Z23 Encounter for immunization: Secondary | ICD-10-CM

## 2012-11-12 DIAGNOSIS — K589 Irritable bowel syndrome without diarrhea: Secondary | ICD-10-CM

## 2012-11-12 DIAGNOSIS — K219 Gastro-esophageal reflux disease without esophagitis: Secondary | ICD-10-CM

## 2012-11-12 DIAGNOSIS — F329 Major depressive disorder, single episode, unspecified: Secondary | ICD-10-CM

## 2012-11-12 DIAGNOSIS — M1990 Unspecified osteoarthritis, unspecified site: Secondary | ICD-10-CM

## 2012-11-12 DIAGNOSIS — E785 Hyperlipidemia, unspecified: Secondary | ICD-10-CM

## 2012-11-12 DIAGNOSIS — N289 Disorder of kidney and ureter, unspecified: Secondary | ICD-10-CM

## 2012-11-12 DIAGNOSIS — I10 Essential (primary) hypertension: Secondary | ICD-10-CM

## 2012-11-12 DIAGNOSIS — R51 Headache: Secondary | ICD-10-CM

## 2012-11-12 DIAGNOSIS — C179 Malignant neoplasm of small intestine, unspecified: Secondary | ICD-10-CM

## 2012-11-12 DIAGNOSIS — I2510 Atherosclerotic heart disease of native coronary artery without angina pectoris: Secondary | ICD-10-CM | POA: Diagnosis not present

## 2012-11-12 DIAGNOSIS — D126 Benign neoplasm of colon, unspecified: Secondary | ICD-10-CM

## 2012-11-12 LAB — BASIC METABOLIC PANEL
CO2: 23 mEq/L (ref 19–32)
Chloride: 104 mEq/L (ref 96–112)
Potassium: 4.2 mEq/L (ref 3.5–5.1)
Sodium: 136 mEq/L (ref 135–145)
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
## 2012-11-12 NOTE — Patient Instructions (Signed)
Today we updated your med list in our EPIC system...    Continue your current medications the same...  Today we did your follow up DM blood work...    We will contact you w/ the results when available & determine any changes in your meds...  Let's get on track w/ our LOW CARB diavetic diet...    Look up "the glycemic index" and avoid foods w/ the high Glycemic indicies...  Call for any questions...  Let's plan a follow up visit in 40mo, sooner if needed for problems.Marland KitchenMarland Kitchen

## 2012-12-04 ENCOUNTER — Telehealth: Payer: Self-pay | Admitting: Pulmonary Disease

## 2012-12-04 MED ORDER — HYDROCODONE-ACETAMINOPHEN 5-325 MG PO TABS: 1.0000 | ORAL_TABLET | Freq: Three times a day (TID) | ORAL | Status: DC | PRN

## 2012-12-04 NOTE — Telephone Encounter (Signed)
rx was given to the pt. Nothing further is needed.

## 2012-12-04 NOTE — Telephone Encounter (Signed)
Last OV 11/12/12 Pending OV 05/12/13 Last fill 05/08/12 #90 with 5 additional fills  SN - please advise. Thanks.

## 2012-12-04 NOTE — Telephone Encounter (Signed)
rx has been printed and placed on SN cart to be signed.  Will call pt once this is done.

## 2013-01-07 ENCOUNTER — Other Ambulatory Visit: Payer: Self-pay | Admitting: Pulmonary Disease

## 2013-01-07 ENCOUNTER — Telehealth: Payer: Self-pay | Admitting: Pulmonary Disease

## 2013-01-07 MED ORDER — HYDROCODONE-ACETAMINOPHEN 5-325 MG PO TABS: 1.0000 | ORAL_TABLET | Freq: Three times a day (TID) | ORAL | Status: DC | PRN

## 2013-01-07 NOTE — Telephone Encounter (Signed)
rx has been printed out and placed on SN cart to be signed.  Will call pt once this is ready to be picked up.  

## 2013-01-07 NOTE — Telephone Encounter (Signed)
Rx signed by SN & given to patient.  Nothing further needed at this time.  [name]

## 2013-01-18 ENCOUNTER — Other Ambulatory Visit: Payer: Self-pay | Admitting: Pulmonary Disease

## 2013-02-17 ENCOUNTER — Telehealth: Payer: Self-pay | Admitting: Pulmonary Disease

## 2013-02-17 MED ORDER — HYDROCODONE-ACETAMINOPHEN 5-325 MG PO TABS: 1.0000 | ORAL_TABLET | Freq: Three times a day (TID) | ORAL | Status: DC | PRN

## 2013-02-17 NOTE — Telephone Encounter (Signed)
Spoke with pt. Advised her will print off rx and have sign this. Pt wants this mailed to her. Confirmed mailing. Address nothing further needed Will forward to Leigh to f/u on

## 2013-03-19 DIAGNOSIS — Z23 Encounter for immunization: Secondary | ICD-10-CM | POA: Diagnosis not present

## 2013-03-25 ENCOUNTER — Encounter: Payer: Self-pay | Admitting: Cardiovascular Disease

## 2013-03-25 ENCOUNTER — Ambulatory Visit (INDEPENDENT_AMBULATORY_CARE_PROVIDER_SITE_OTHER): Payer: Medicare Other | Admitting: Cardiovascular Disease

## 2013-03-25 VITALS — BP 148/88 | HR 58 | Ht 66.0 in | Wt 167.5 lb

## 2013-03-25 DIAGNOSIS — E785 Hyperlipidemia, unspecified: Secondary | ICD-10-CM | POA: Diagnosis not present

## 2013-03-25 DIAGNOSIS — Z79899 Other long term (current) drug therapy: Secondary | ICD-10-CM | POA: Diagnosis not present

## 2013-03-25 DIAGNOSIS — I10 Essential (primary) hypertension: Secondary | ICD-10-CM

## 2013-03-25 DIAGNOSIS — I2510 Atherosclerotic heart disease of native coronary artery without angina pectoris: Secondary | ICD-10-CM | POA: Diagnosis not present

## 2013-03-25 LAB — LIPID PANEL
CHOLESTEROL: 104 mg/dL (ref 0–200)
HDL: 40 mg/dL (ref >39)
LDL Cholesterol: 25 mg/dL (ref 0–99)
Total CHOL/HDL Ratio: 2.6 Ratio
Triglycerides: 193 mg/dL — ABNORMAL HIGH (ref <150)
VLDL: 39 mg/dL (ref 0–40)

## 2013-03-25 LAB — HEPATIC FUNCTION PANEL
ALK PHOS: 75 U/L (ref 39–117)
ALT: 18 U/L (ref 0–35)
AST: 22 U/L (ref 0–37)
Albumin: 4.1 g/dL (ref 3.5–5.2)
BILIRUBIN DIRECT: 0.1 mg/dL (ref 0.0–0.3)
Indirect Bilirubin: 0.2 mg/dL (ref 0.2–1.2)
Total Bilirubin: 0.3 mg/dL (ref 0.2–1.2)
Total Protein: 6.8 g/dL (ref 6.0–8.3)

## 2013-03-25 NOTE — Assessment & Plan Note (Signed)
Controlled on current medications 

## 2013-03-25 NOTE — Assessment & Plan Note (Signed)
History of coronary artery bypass grafting in 1997 by Dr. Tharon Aquas Trigt. Her last catheterization performed by Dr. Rollene Fare 12/13/06 revealed patent grafts with normal LV function. She had a LIMA to the LAD, a vein to a high marginal branch, and a vein to the distal right coronary artery.her last Myoview stress test performed 3 years ago was nonischemic. She denies chest pain or shortness of breath.

## 2013-03-25 NOTE — Patient Instructions (Signed)
Your physician wants you to follow-up in: 1 year with Dr Gwenlyn Found. You will receive a reminder letter in the mail two months in advance. If you don't receive a letter, please call our office to schedule the follow-up appointment.  Dr Cathlean Cower [phone] Dr Cristie Hem Plotnikov [phone] Dr Emi Belfast [phone]

## 2013-03-25 NOTE — Assessment & Plan Note (Signed)
On statin therapy with recent lipid profile performed 05/08/12 revealing a total cholesterol of 121, HDL 45.

## 2013-03-25 NOTE — Progress Notes (Signed)
03/25/2013 [place]   06-19-1940  [id number]  Primary Physician Noralee Space, MD Primary Cardiologist: Lorretta Harp MD [name]   HPI:  [name] is a 73 year old mildly overweight widowed Caucasian female mother of 2 living children (son at age 51 died of an overdose), grandmother of 5 grandchildren is a patient of Dr. Terance Ice. I'm assuming her care. Her cardiac risk factor profile is walkover treated diabetes, hypertension and hyperlipidemia. She has a strong family history of heart disease with a father who died of a myocardial infarction at age 55 and her mother at age 53. She had a heart attack in 1997 at which time I performed cardiac catheterization. Apparently I placed a balloon pump at that time and sent her to open heart surgery. Dr. Collier Salina eventuate a coronary artery bypass grafting x5. She had her last catheterization performed by Dr. Rollene Fare December 2008 revealing patent grafts with normal LV function. She had a negative Myoview back in 2012. She is currently asymptomatic.   Current Outpatient Prescriptions  Medication Sig Dispense Refill  . aspirin 325 MG tablet Take 325 mg by mouth daily.      Marland Kitchen atenolol (TENORMIN) 50 MG tablet TAKE 1 TABLET BY MOUTH EVERY DAY  90 tablet  2  . Blood Glucose Monitoring Suppl (ONE TOUCH ULTRA SYSTEM KIT) W/DEVICE KIT 1 kit by Does not apply route once.  1 each  0  . cholecalciferol (VITAMIN D) 1000 UNITS tablet Take 1,000 Units by mouth daily.        . clopidogrel (PLAVIX) 75 MG tablet Take 1 tablet (75 mg total) by mouth daily.  90 tablet  3  . clorazepate (TRANXENE) 7.5 MG tablet Take 1 tablet (7.5 mg total) by mouth as needed.  30 tablet  5  . dicyclomine (BENTYL) 20 MG tablet Take 1 tablet (20 mg total) by mouth 3 (three) times daily as needed (for abdominal cramping).  50 tablet  5  . glimepiride (AMARYL) 4 MG tablet TAKE 1 TABLET BY MOUTH TWICE DAILY  180 tablet  3  . glucose blood test strip Use  as instructed  100 each  12  . hydrochlorothiazide (HYDRODIURIL) 25 MG tablet Take 12.5 mg by mouth daily.       Marland Kitchen HYDROcodone-acetaminophen (NORCO/VICODIN) 5-325 MG per tablet Take 1 tablet by mouth 3 (three) times daily as needed.  90 tablet  0  . Insulin Glargine 100 UNIT/ML SOPN Inject 40 Units into the skin at bedtime.       . Insulin Pen Needle (NOVOFINE) 32G X 6 MM MISC Use as directed to check blood sugar  100 each  6  . metFORMIN (GLUCOPHAGE) 1000 MG tablet Take 0.5 tablets (500 mg total) by mouth 2 (two) times daily with a meal.      . Multiple Vitamin (MULTIVITAMIN) capsule Take 1 capsule by mouth daily.        Marland Kitchen neomycin-polymyxin-hydrocortisone (CORTISPORIN) otic solution 2-3 drops in each ear three times daily  10 mL  2  . NIASPAN 500 MG CR tablet TAKE 1 TABLET BY MOUTH EVERY DAY  90 tablet  2  . omeprazole (PRILOSEC) 40 MG capsule Take 1 capsule (40 mg total) by mouth daily.  90 capsule  3  . ONETOUCH DELICA LANCETS 74Q MISC 1 Device by Does not apply route every morning.  100 each  12  . ramipril (ALTACE) 10 MG capsule TAKE ONE CAPSULE BY MOUTH DAILY  90 capsule  1  .  rosuvastatin (CRESTOR) 10 MG tablet Take 1 tablet (10 mg total) by mouth daily.  21 tablet  0   No current facility-administered medications for this visit.    Allergies  Allergen Reactions  . Codeine     REACTION: makes her nervous  . Ibuprofen     REACTION: nervous  . Meperidine Hcl     REACTION: nasuea and vomitting  . Naproxen Sodium     REACTION: nervous    History   Social History  . Marital Status: Widowed    Spouse Name: [name]    Number of Children: 2  . Years of Education: [name]   Occupational History  . retired    Social History Main Topics  . Smoking status: Never Smoker   . Smokeless tobacco: Not on file  . Alcohol Use: No  . Drug Use: No  . Sexual Activity: Not on file   Other Topics Concern  . Not on file   Social History Narrative  . No narrative on file     Review of  Systems: General: negative for chills, fever, night sweats or weight changes.  Cardiovascular: negative for chest pain, dyspnea on exertion, edema, orthopnea, palpitations, paroxysmal nocturnal dyspnea or shortness of breath Dermatological: negative for rash Respiratory: negative for cough or wheezing Urologic: negative for hematuria Abdominal: negative for nausea, vomiting, diarrhea, bright red blood per rectum, melena, or hematemesis Neurologic: negative for visual changes, syncope, or dizziness All other systems reviewed and are otherwise negative except as noted above.    Blood pressure 148/88, pulse 58, height 5' 6"  (1.676 m), weight 167 lb 8 oz (75.978 kg).  General appearance: alert and no distress Neck: no adenopathy, no carotid bruit, no JVD, supple, symmetrical, trachea midline and thyroid not enlarged, symmetric, no tenderness/mass/nodules Lungs: clear to auscultation bilaterally Heart: regular rate and rhythm, S1, S2 normal, no murmur, click, rub or gallop Abdomen: soft, non-tender; bowel sounds normal; no masses,  no organomegaly Extremities: extremities normal, atraumatic, no cyanosis or edema  EKG sinus bradycardia at 58 with right bundle-branch block.  ASSESSMENT AND PLAN:   ARTERIOSCLEROTIC HEART DISEASE History of coronary artery bypass grafting in 1997 by Dr. Tharon Aquas Trigt. Her last catheterization performed by Dr. Rollene Fare 12/13/06 revealed patent grafts with normal LV function. She had a LIMA to the LAD, a vein to a high marginal branch, and a vein to the distal right coronary artery.her last Myoview stress test performed 3 years ago was nonischemic. She denies chest pain or shortness of breath.  HYPERTENSION Controlled on current medications  HYPERLIPIDEMIA On statin therapy with recent lipid profile performed 05/08/12 revealing a total cholesterol of 121, HDL 45.      Lorretta Harp MD FACP,FACC,FAHA, [facility] 03/25/2013 8:51 AM

## 2013-03-26 ENCOUNTER — Telehealth: Payer: Self-pay | Admitting: Pulmonary Disease

## 2013-03-26 NOTE — Telephone Encounter (Signed)
Called and spoke with pt and she is aware that she can call to [facility] to try and see who is taking on new pts.  Pt was given the number to [place] office and to the Dr. Ernie Hew.  Pt is aware.

## 2013-03-26 NOTE — Telephone Encounter (Signed)
Pt also states she needs refill on her meds - HCT, Clopidogrel, Niacin, Ramipril, Crestor, Atenolol, Hydocodone, Glimepiride.  She would like samples of Crestor.  Uses [facility] at [address].

## 2013-03-27 NOTE — Telephone Encounter (Signed)
Pt is still waiting to get samples and rx for Vicodin. Pt has not heard from nurse. Pt will stop by tomorrow

## 2013-03-27 NOTE — Telephone Encounter (Signed)
LMTCB

## 2013-03-28 MED ORDER — HYDROCODONE-ACETAMINOPHEN 5-325 MG PO TABS: 1.0000 | ORAL_TABLET | Freq: Three times a day (TID) | ORAL | Status: DC | PRN

## 2013-03-28 NOTE — Telephone Encounter (Signed)
Pt returned call & can be reached [phone].  [name]

## 2013-03-28 NOTE — Telephone Encounter (Signed)
Called and spoke with pt and she is aware of the rx for the hydrocodone that is up front to be picked up.  Pt may come by today to pick this up.  If not then it will be Monday.  Nothing further is needed and pt is aware

## 2013-04-01 ENCOUNTER — Encounter: Payer: Self-pay | Admitting: *Deleted

## 2013-04-20 ENCOUNTER — Other Ambulatory Visit: Payer: Self-pay | Admitting: Pulmonary Disease

## 2013-05-06 ENCOUNTER — Telehealth: Payer: Self-pay | Admitting: Pulmonary Disease

## 2013-05-06 MED ORDER — ROSUVASTATIN CALCIUM 10 MG PO TABS: 10.0000 mg | ORAL_TABLET | Freq: Every day | ORAL | Status: DC

## 2013-05-06 MED ORDER — HYDROCODONE-ACETAMINOPHEN 5-325 MG PO TABS: 1.0000 | ORAL_TABLET | Freq: Three times a day (TID) | ORAL | Status: DC | PRN

## 2013-05-06 NOTE — Telephone Encounter (Signed)
Pt called back and she is aware of samples up front and the rx for the hydrocodone.  rx has been sent to her pharmacy.  Pt will call Dr. Ival Bible office to see if they will take her insurance.  She stated that she will call me back next week to let me know.  If she is not able to get an appt with new pcp soon, then we will schedule her an appt with SN.  Pt is aware. Nothing further is needed.

## 2013-05-06 NOTE — Telephone Encounter (Signed)
rx has been printed out and placed on SN cart to be signed for the vicodin.

## 2013-05-06 NOTE — Telephone Encounter (Signed)
rx has been signed by SN and left up front with samples of the crestor.  Need to speak with pt about new pcp.

## 2013-05-12 ENCOUNTER — Ambulatory Visit: Payer: Medicare Other | Admitting: Pulmonary Disease

## 2013-06-17 ENCOUNTER — Telehealth: Payer: Self-pay | Admitting: Pulmonary Disease

## 2013-06-17 MED ORDER — HYDROCODONE-ACETAMINOPHEN 5-325 MG PO TABS: 1.0000 | ORAL_TABLET | Freq: Three times a day (TID) | ORAL | Status: DC | PRN

## 2013-06-17 NOTE — Telephone Encounter (Signed)
rx has been printed out and placed on SN cart to be signed.  Will call pt once this is ready.

## 2013-06-17 NOTE — Telephone Encounter (Signed)
Spoke with pt and given name and numbers for Dr Ernie Hew and Elvia Collum.  Pt states that she will call one of them today to schedule appt.  Pt states she is out of Vicodin and is requesting to come pick up rx.  Last filled on 05/06/13 for # 90 tablets.  Please advise.

## 2013-06-17 NOTE — Telephone Encounter (Signed)
rx has been signed by SN and i have called and lmom to make the pt aware of rx is ready.

## 2013-06-18 ENCOUNTER — Telehealth: Payer: Self-pay | Admitting: Pulmonary Disease

## 2013-06-18 MED ORDER — ROSUVASTATIN CALCIUM 10 MG PO TABS: 10.0000 mg | ORAL_TABLET | Freq: Every day | ORAL | Status: DC

## 2013-06-18 NOTE — Telephone Encounter (Signed)
Pt has bene giving samples. Nothing further needed

## 2013-06-19 ENCOUNTER — Encounter: Payer: Medicare Other | Admitting: Family Medicine

## 2013-06-21 ENCOUNTER — Other Ambulatory Visit: Payer: Self-pay | Admitting: Adult Health

## 2013-06-28 ENCOUNTER — Other Ambulatory Visit: Payer: Self-pay | Admitting: Pulmonary Disease

## 2013-07-04 ENCOUNTER — Other Ambulatory Visit: Payer: Self-pay | Admitting: Adult Health

## 2013-07-04 ENCOUNTER — Other Ambulatory Visit: Payer: Self-pay | Admitting: Pulmonary Disease

## 2013-07-07 ENCOUNTER — Telehealth: Payer: Self-pay | Admitting: Pulmonary Disease

## 2013-07-07 MED ORDER — CLOPIDOGREL BISULFATE 75 MG PO TABS: ORAL_TABLET | ORAL | Status: DC

## 2013-07-07 MED ORDER — INSULIN PEN NEEDLE 32G X 6 MM MISC: Status: DC

## 2013-07-07 MED ORDER — NIACIN ER (ANTIHYPERLIPIDEMIC) 500 MG PO TBCR: EXTENDED_RELEASE_TABLET | ORAL | Status: DC

## 2013-07-07 MED ORDER — INSULIN GLARGINE 100 UNIT/ML SOLOSTAR PEN: 40.0000 [IU] | PEN_INJECTOR | Freq: Every day | SUBCUTANEOUS | Status: DC

## 2013-07-07 MED ORDER — HYDROCHLOROTHIAZIDE 25 MG PO TABS: 12.5000 mg | ORAL_TABLET | Freq: Every day | ORAL | Status: DC

## 2013-07-07 MED ORDER — GLUCOSE BLOOD VI STRP: ORAL_STRIP | Status: DC

## 2013-07-07 MED ORDER — ATENOLOL 50 MG PO TABS: ORAL_TABLET | ORAL | Status: DC

## 2013-07-07 MED ORDER — HYDROCODONE-ACETAMINOPHEN 5-325 MG PO TABS: 1.0000 | ORAL_TABLET | Freq: Three times a day (TID) | ORAL | Status: DC | PRN

## 2013-07-07 MED ORDER — ROSUVASTATIN CALCIUM 10 MG PO TABS: 10.0000 mg | ORAL_TABLET | Freq: Every day | ORAL | Status: DC

## 2013-07-07 NOTE — Telephone Encounter (Signed)
Called and spoke with pt and she is aware of refills that have been sent to the pharmacy per pts request.  rx for the hydrocodone has been printed out and will leave up front for the pt to pick up tomorrow.

## 2013-07-21 ENCOUNTER — Other Ambulatory Visit: Payer: Self-pay | Admitting: Pulmonary Disease

## 2013-08-05 ENCOUNTER — Other Ambulatory Visit: Payer: Self-pay | Admitting: Pulmonary Disease

## 2013-08-25 ENCOUNTER — Encounter: Payer: Medicare Other | Admitting: Family Medicine

## 2013-08-26 ENCOUNTER — Encounter: Payer: Self-pay | Admitting: Internal Medicine

## 2013-08-26 ENCOUNTER — Ambulatory Visit (INDEPENDENT_AMBULATORY_CARE_PROVIDER_SITE_OTHER): Payer: Medicare Other | Admitting: Internal Medicine

## 2013-08-26 ENCOUNTER — Ambulatory Visit: Payer: Medicare Other

## 2013-08-26 VITALS — BP 132/82 | HR 66 | Temp 98.1°F | Ht 66.0 in | Wt 171.5 lb

## 2013-08-26 DIAGNOSIS — I10 Essential (primary) hypertension: Secondary | ICD-10-CM

## 2013-08-26 DIAGNOSIS — M1990 Unspecified osteoarthritis, unspecified site: Secondary | ICD-10-CM | POA: Diagnosis not present

## 2013-08-26 DIAGNOSIS — I2510 Atherosclerotic heart disease of native coronary artery without angina pectoris: Secondary | ICD-10-CM

## 2013-08-26 DIAGNOSIS — D72829 Elevated white blood cell count, unspecified: Secondary | ICD-10-CM

## 2013-08-26 DIAGNOSIS — E785 Hyperlipidemia, unspecified: Secondary | ICD-10-CM

## 2013-08-26 DIAGNOSIS — E119 Type 2 diabetes mellitus without complications: Secondary | ICD-10-CM

## 2013-08-26 LAB — BASIC METABOLIC PANEL
BUN: 23 mg/dL (ref 6–23)
CO2: 27 meq/L (ref 19–32)
Calcium: 9.8 mg/dL (ref 8.4–10.5)
Chloride: 103 mEq/L (ref 96–112)
Creatinine, Ser: 1.4 mg/dL — ABNORMAL HIGH (ref 0.4–1.2)
GFR: 38.44 mL/min — ABNORMAL LOW (ref >60.00)
GLUCOSE: 304 mg/dL — AB (ref 70–99)
Potassium: 4.4 mEq/L (ref 3.5–5.1)
SODIUM: 137 meq/L (ref 135–145)

## 2013-08-26 LAB — CBC WITH DIFFERENTIAL/PLATELET
Basophils Absolute: 0.1 10*3/uL (ref 0.0–0.1)
Basophils Relative: 0.4 % (ref 0.0–3.0)
EOS PCT: 1.3 % (ref 0.0–5.0)
Eosinophils Absolute: 0.2 10*3/uL (ref 0.0–0.7)
HCT: 36.7 % (ref 36.0–46.0)
HEMOGLOBIN: 12.2 g/dL (ref 12.0–15.0)
Lymphs Abs: 8.6 10*3/uL — ABNORMAL HIGH (ref 0.7–4.0)
MCHC: 33.4 g/dL (ref 30.0–36.0)
MCV: 93.4 fl (ref 78.0–100.0)
Monocytes Absolute: 0.8 10*3/uL (ref 0.1–1.0)
Monocytes Relative: 5.6 % (ref 3.0–12.0)
NEUTROS PCT: 30.8 % — AB (ref 43.0–77.0)
Neutro Abs: 4.3 10*3/uL (ref 1.4–7.7)
PLATELETS: 185 10*3/uL (ref 150.0–400.0)
RBC: 3.93 Mil/uL (ref 3.87–5.11)
RDW: 13 % (ref 11.5–15.5)
WBC: 14 10*3/uL — ABNORMAL HIGH (ref 4.0–10.5)

## 2013-08-26 LAB — HEMOGLOBIN A1C: HEMOGLOBIN A1C: 10.2 % — AB (ref 4.6–6.5)

## 2013-08-26 MED ORDER — HYDROCODONE-ACETAMINOPHEN 5-325 MG PO TABS: 1.0000 | ORAL_TABLET | Freq: Three times a day (TID) | ORAL | Status: DC | PRN

## 2013-08-26 NOTE — Assessment & Plan Note (Signed)
On hydrocodone as needed, Most pain at the left knee and hands

## 2013-08-26 NOTE — Progress Notes (Signed)
Pre-visit discussion using our clinic review tool. No additional management support is needed unless otherwise documented below in the visit note.  

## 2013-08-26 NOTE — Progress Notes (Signed)
Subjective:    Patient ID: [employer], female    DOB: [birth year], 73 y.o.   MRN: [id number]  DOS:  08/26/2013 Type of visit - description: New patient, to get established , Chart reviewed History: History of diabetes, on Lantus for several months, CBGs with insulin initially improve but now in the mornings are around 200. Anxiety, on Tranxene as needed, doing well. Pain management, has primarily hand and left knee pain on hydrocodone as needed High cholesterol, good medication compliance including Crestor Niaspan , niacin Hypertension, good medication compliance, ambulatory BPs are similar to today's BP which is very good. CAD, last visit with her new cardiologist Dr. Gwenlyn Found in March 2015, she was felt to be stable   ROS She is physically active mostly doing yardwork. Tries to follow a healthy diet although no formal visitation about diet in years. Denies chest pain or difficulty breathing. No nausea, vomiting, diarrhea  Past Medical History  Diagnosis Date  . Hypertension   . CAD (coronary artery disease)     Dr Gwenlyn Found  . Venous insufficiency   . Hyperlipidemia   . Diabetes mellitus   . GERD (gastroesophageal reflux disease)   . Gastritis   . Hx of colonic polyps   . Malignant neoplasm of small intestine   . Ventral hernia   . DJD (degenerative joint disease)   . Headache(784.0)   . Depression   . History of shingles   . History of anemia   . Vitamin B12 deficiency     Past Surgical History  Procedure Laterality Date  . Cabg x 5  1997  . Cesarean section    . Left knee surgery    . Tonsillectomy    . Tubal ligation    . 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 March Rummage  . Lap ventral hernia repair with incarcerated colon  09/2008    Dr. Lucia Gaskins    History   Social History  . Marital Status: Widowed    Spouse Name: N/A    Number of Children: 2  . Years of Education: N/A   Occupational History  . retired-- [employer]    .     Social History  Main Topics  . Smoking status: Never Smoker   . Smokeless tobacco: Never Used  . Alcohol Use: No  . Drug Use: No  . Sexual Activity: Not on file   Other Topics Concern  . Not on file   Social History Narrative   Lives by herself, lost husband ~ 2004        Medication List       This list is accurate as of: 08/26/13 11:59 PM.  Always use your most recent med list.               aspirin 325 MG tablet  Take 325 mg by mouth daily.     atenolol 50 MG tablet  Commonly known as:  TENORMIN  TAKE 1 TABLET BY MOUTH DAILY     cholecalciferol 1000 UNITS tablet  Commonly known as:  VITAMIN D  Take 1,000 Units by mouth daily.     clopidogrel 75 MG tablet  Commonly known as:  PLAVIX  TAKE 1 TABLET BY MOUTH DAILY     clorazepate 7.5 MG tablet  Commonly known as:  TRANXENE  Take 1 tablet (7.5 mg total) by mouth as needed.     dicyclomine 20 MG tablet  Commonly known as:  BENTYL  Take 1 tablet (  20 mg total) by mouth 3 (three) times daily as needed (for abdominal cramping).     glimepiride 4 MG tablet  Commonly known as:  AMARYL  TAKE 1 TABLET BY MOUTH TWICE DAILY     glucose blood test strip  Commonly known as:  ONE TOUCH ULTRA TEST  USE AS DIRECTED     hydrochlorothiazide 25 MG tablet  Commonly known as:  HYDRODIURIL  Take 0.5 tablets (12.5 mg total) by mouth daily.     HYDROcodone-acetaminophen 5-325 MG per tablet  Commonly known as:  NORCO/VICODIN  Take 1 tablet by mouth 3 (three) times daily as needed.     Insulin Glargine 100 UNIT/ML Solostar Pen  Commonly known as:  LANTUS  Inject 40 Units into the skin at bedtime.     Insulin Pen Needle 32G X 6 MM Misc  Commonly known as:  NOVOFINE  Use as directed to check blood sugar     metFORMIN 1000 MG tablet  Commonly known as:  GLUCOPHAGE  Take 0.5 tablets (500 mg total) by mouth 2 (two) times daily with a meal.     multivitamin capsule  Take 1 capsule by mouth daily.     neomycin-polymyxin-hydrocortisone otic  solution  Commonly known as:  CORTISPORIN  2-3 drops in each ear three times daily     niacin 500 MG CR tablet  Commonly known as:  NIASPAN  TAKE 1 TABLET BY MOUTH EVERY DAY     omeprazole 40 MG capsule  Commonly known as:  PRILOSEC  Take 1 capsule (40 mg total) by mouth daily.     ONE TOUCH ULTRA SYSTEM KIT W/DEVICE Kit  1 kit by Does not apply route once.     ONETOUCH DELICA LANCETS 81J Misc  1 Device by Does not apply route every morning.     ramipril 10 MG capsule  Commonly known as:  ALTACE  TAKE ONE CAPSULE BY MOUTH DAILY     rosuvastatin 10 MG tablet  Commonly known as:  CRESTOR  Take 1 tablet (10 mg total) by mouth daily.           Objective:   Physical Exam BP 132/82  Pulse 66  Temp(Src) 98.1 F (36.7 C) (Oral)  Ht 5' 6"  (1.676 m)  Wt 171 lb 8 oz (77.792 kg)  BMI 27.69 kg/m2  SpO2 96% General -- alert, well-developed, NAD.  HEENT-- Not pale.  Lungs -- normal respiratory effort, no intercostal retractions, no accessory muscle use, and normal breath sounds.  Heart-- normal rate, regular rhythm, no murmur.   Extremities-- no pretibial edema bilaterally  Neurologic--  alert & oriented X3. Speech normal, gait appropriate for age, strength symmetric and appropriate for age.  Psych-- Cognition and judgment appear intact. Cooperative with normal attention span and concentration. No anxious or depressed appearing.      Assessment & Plan:   F2F > 30 min

## 2013-08-26 NOTE — Assessment & Plan Note (Addendum)
Long  history of diabetes, started Lantus approximately 05-2012. At the same time the patient reports metformin dose was reduced. Last creatinine 1.4. Last A1c ~ 9. Plan: Labs consider increase Lantus, discontinue metformin if creatinine elevated and introduce other medications such as tradjenta Further advice which results

## 2013-08-26 NOTE — Assessment & Plan Note (Signed)
Seems to be well controlled, continue with aspirin, beta blockers and ACE inhibitors. Will work on her diabetes control

## 2013-08-26 NOTE — Assessment & Plan Note (Signed)
Well-controlled with current medications, reassess on return to the office

## 2013-08-26 NOTE — Patient Instructions (Signed)
Get your blood work before you leave   Two great books to learn about diabetes Diabetes fro [employer]umm[employer] "The [facility] Diabetes diet" book     Next visit is for routine check up   in 2-3 months ,  fasting Please make an appointment

## 2013-08-26 NOTE — Assessment & Plan Note (Signed)
Well-controlled on Tenormin, Altace and HCTZ. Check a BMP

## 2013-08-27 LAB — PATHOLOGIST SMEAR REVIEW

## 2013-08-29 NOTE — Addendum Note (Signed)
Addended by: Wilfrid Lund on: 08/29/2013 10:44 AM   Modules accepted: Orders

## 2013-09-01 ENCOUNTER — Other Ambulatory Visit: Payer: Self-pay

## 2013-09-01 DIAGNOSIS — E119 Type 2 diabetes mellitus without complications: Secondary | ICD-10-CM

## 2013-09-01 MED ORDER — INSULIN GLARGINE 100 UNIT/ML SOLOSTAR PEN: 50.0000 [IU] | PEN_INJECTOR | Freq: Every day | SUBCUTANEOUS | Status: DC

## 2013-09-09 DIAGNOSIS — H6120 Impacted cerumen, unspecified ear: Secondary | ICD-10-CM | POA: Diagnosis not present

## 2013-09-09 DIAGNOSIS — H60399 Other infective otitis externa, unspecified ear: Secondary | ICD-10-CM | POA: Diagnosis not present

## 2013-09-18 ENCOUNTER — Other Ambulatory Visit: Payer: Self-pay | Admitting: *Deleted

## 2013-09-18 ENCOUNTER — Ambulatory Visit (INDEPENDENT_AMBULATORY_CARE_PROVIDER_SITE_OTHER): Payer: Medicare Other | Admitting: Endocrinology

## 2013-09-18 ENCOUNTER — Encounter: Payer: Self-pay | Admitting: Endocrinology

## 2013-09-18 VITALS — BP 124/78 | HR 66 | Temp 98.3°F | Resp 16 | Ht 66.0 in | Wt 170.6 lb

## 2013-09-18 DIAGNOSIS — IMO0001 Reserved for inherently not codable concepts without codable children: Secondary | ICD-10-CM

## 2013-09-18 DIAGNOSIS — N183 Chronic kidney disease, stage 3 unspecified: Secondary | ICD-10-CM

## 2013-09-18 DIAGNOSIS — E1165 Type 2 diabetes mellitus with hyperglycemia: Principal | ICD-10-CM

## 2013-09-18 DIAGNOSIS — I2510 Atherosclerotic heart disease of native coronary artery without angina pectoris: Secondary | ICD-10-CM | POA: Diagnosis not present

## 2013-09-18 DIAGNOSIS — I10 Essential (primary) hypertension: Secondary | ICD-10-CM | POA: Diagnosis not present

## 2013-09-18 MED ORDER — "INSULIN SYRINGE 31G X 5/16"" 0.3 ML MISC": Status: DC

## 2013-09-18 MED ORDER — INSULIN REGULAR HUMAN 100 UNIT/ML IJ SOLN: 8.0000 [IU] | Freq: Three times a day (TID) | INTRAMUSCULAR | Status: DC

## 2013-09-18 NOTE — Progress Notes (Signed)
Patient ID: [employer], female   DOB: August 24, 1940, 73 y.o.   MRN: [id number]            Reason for Appointment: Consultation for Type 2 Diabetes  Referring physician:  History of Present Illness:          Diagnosis: Type 2 diabetes mellitus, date of diagnosis: 1998       Past history:   She was treated with metformin at diagnosis when this had been continued until 2015 Subsequently Amaryl was also added several years ago and this has been continued Her blood sugars were under fair control between 2010 and early 2013 with A1c ranging from 7.4-9.4, mostly under 8% However since 08/2011 her A1c has been mostly over 8%  Insulin was added in 2014 with small doses of Lantus and this has been progressively increased  Recent history:  Metformin stopped in 8/15 because of renal dysfunction and her insulin dose was increased from 40 up to 50 units of Lantus once a day She typically checks her blood sugars only in the morning She does not think her sugars have been under control for several months with persistently high A1c readings She also has been unable to lose any weight and is at her maximum weight currently Because of persistently poor control she has been referred here for further management She does desire more information on meal planning and she considers her diet as her main problem       Oral hypoglycemic drugs the patient is taking are: Amaryl 4 mg twice a day      Side effects from medications have been: None INSULIN regimen is described as: 50 units hs since 8/15  Compliance with the medical regimen: good  Hypoglycemia: none  Glucose monitoring:  done one time a day         Glucometer: One Touch.      Blood Glucose readings by time of day   PREMEAL Breakfast Lunch Dinner Bedtime  Overall   Glucose range: 158-292      Median:         Self-care: The diet that the patient has been following is: none, usually eating low fat meals Meals: 3 meals per day at irregular  times. Breakfast is a Cookie, usually eating sandwiches for meals, snacks will be with crackers or ice cream           Exercise: Walking at times and doing some household activities         [place] visit, most recent: never               Weight history: 150-170  Wt Readings from Last 3 Encounters:  09/18/13 170 lb 9.6 oz (77.384 kg)  08/26/13 171 lb 8 oz (77.792 kg)  03/25/13 167 lb 8 oz (75.978 kg)    Glycemic control:   Lab Results  Component Value Date   HGBA1C 10.2* 08/26/2013   HGBA1C 9.2* 11/12/2012   HGBA1C 8.9* 08/21/2012   Lab Results  Component Value Date   LDLCALC 25 03/25/2013   CREATININE 1.4* 08/26/2013        Medication List       This list is accurate as of: 09/18/13  9:23 AM.  Always use your most recent med list.               aspirin 325 MG tablet  Take 325 mg by mouth daily.     atenolol 50 MG tablet  Commonly known as:  TENORMIN  TAKE 1 TABLET BY MOUTH DAILY     cholecalciferol 1000 UNITS tablet  Commonly known as:  VITAMIN D  Take 1,000 Units by mouth daily.     clopidogrel 75 MG tablet  Commonly known as:  PLAVIX  TAKE 1 TABLET BY MOUTH DAILY     clorazepate 7.5 MG tablet  Commonly known as:  TRANXENE  Take 1 tablet (7.5 mg total) by mouth as needed.     dicyclomine 20 MG tablet  Commonly known as:  BENTYL  Take 1 tablet (20 mg total) by mouth 3 (three) times daily as needed (for abdominal cramping).     glimepiride 4 MG tablet  Commonly known as:  AMARYL  TAKE 1 TABLET BY MOUTH TWICE DAILY     glucose blood test strip  Commonly known as:  ONE TOUCH ULTRA TEST  USE AS DIRECTED     hydrochlorothiazide 25 MG tablet  Commonly known as:  HYDRODIURIL  Take 0.5 tablets (12.5 mg total) by mouth daily.     HYDROcodone-acetaminophen 5-325 MG per tablet  Commonly known as:  NORCO/VICODIN  Take 1 tablet by mouth 3 (three) times daily as needed.     Insulin Glargine 100 UNIT/ML Solostar Pen  Commonly known as:  LANTUS  Inject 50  Units into the skin at bedtime.     Insulin Pen Needle 32G X 6 MM Misc  Commonly known as:  NOVOFINE  Use as directed to check blood sugar     multivitamin capsule  Take 1 capsule by mouth daily.     neomycin-polymyxin-hydrocortisone otic solution  Commonly known as:  CORTISPORIN  2-3 drops in each ear three times daily     niacin 500 MG CR tablet  Commonly known as:  NIASPAN  TAKE 1 TABLET BY MOUTH EVERY DAY     omeprazole 40 MG capsule  Commonly known as:  PRILOSEC  Take 1 capsule (40 mg total) by mouth daily.     ONE TOUCH ULTRA SYSTEM KIT W/DEVICE Kit  1 kit by Does not apply route once.     ONETOUCH DELICA LANCETS 88F Misc  1 Device by Does not apply route every morning.     ramipril 10 MG capsule  Commonly known as:  ALTACE  TAKE ONE CAPSULE BY MOUTH DAILY     rosuvastatin 10 MG tablet  Commonly known as:  CRESTOR  Take 1 tablet (10 mg total) by mouth daily.        Allergies:  Allergies  Allergen Reactions  . Codeine     REACTION: makes her nervous  . Ibuprofen     REACTION: nervous  . Meperidine Hcl     REACTION: nasuea and vomitting  . Naproxen Sodium     REACTION: nervous    Past Medical History  Diagnosis Date  . Hypertension   . CAD (coronary artery disease)     Dr Gwenlyn Found  . Venous insufficiency   . Hyperlipidemia   . Diabetes mellitus   . GERD (gastroesophageal reflux disease)   . Gastritis   . Hx of colonic polyps   . Malignant neoplasm of small intestine   . Ventral hernia   . DJD (degenerative joint disease)   . Headache(784.0)   . Depression   . History of shingles   . History of anemia   . Vitamin B12 deficiency     Past Surgical History  Procedure Laterality Date  . Cabg x 5  1997  . Cesarean section    .  Left knee surgery    . Tonsillectomy    . Tubal ligation    . 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 March Rummage  . Lap ventral hernia repair with incarcerated colon  09/2008    Dr. Lucia Gaskins    No family history  on file.  Social History:  reports that she has never smoked. She has never used smokeless tobacco. She reports that she does not drink alcohol or use illicit drugs.    Review of Systems       Vision is normal. Most recent eye exam was in 2013       Lipids: Has had excellent control of hypercholesterolemia with Crestor which she has taken for several years, on lipid-lowering therapy since her coronary bypass surgery       Lab Results  Component Value Date   CHOL 104 03/25/2013   HDL 40 03/25/2013   LDLCALC 25 03/25/2013   LDLDIRECT 43.1 05/08/2012   TRIG 193* 03/25/2013   CHOLHDL 2.6 03/25/2013                  Skin: No rash or infections     Thyroid:  No  unusual fatigue.     The blood pressure has been well controlled and she is taking HCTZ, ramipril and atenolol      No swelling of feet recently.     No shortness of breath or chest tightness  on exertion.     Bowel habits: Normal, may take Benadryl for cramps.      She has finger joint  pains and morning stiffness.          No history of Numbness, tingling or burning in feet but occasional tingling in her left leg which has had severe injuries in the past   LABS:    Physical Examination:  BP 124/78  Pulse 66  Temp(Src) 98.3 F (36.8 C)  Resp 16  Ht 5' 6"  (1.676 m)  Wt 170 lb 9.6 oz (77.384 kg)  BMI 27.55 kg/m2  SpO2 95%  She is averagely built and nourished  HEENT:         Eye exam shows normal external appearance. Fundus exam shows no retinopathy. Oral exam shows normal mucosa .  NECK:         General:  Neck exam shows no lymphadenopathy. Carotids are normal to palpation and no bruit heard.  Thyroid is not enlarged and no nodules felt.   LUNGS:         Chest is symmetrical. Lungs are clear to auscultation.Marland Kitchen   HEART:         Heart sounds:  S1 and S2 are normal. No murmurs or clicks heard., no S3 or S4.   ABDOMEN:   There is no distention present. Liver and spleen are not palpable. No other mass or tenderness  present.  EXTREMITIES:     There is no edema. No skin lesions present.Marland Kitchen  NEUROLOGICAL:   Vibration sense is  reduced in toes. Ankle jerks are absent bilaterally.          Diabetic foot exam: Decreased monofilament in toes and plantar surfaces, skin exam is normal Pedal pulses absent left DP   MUSCULOSKELETAL:       There is no enlargement or deformity of the joints. She has mild Dupuytren's contractures on her hands in the midline.  Spine is normal to inspection without kyphosis.Marland Kitchen   SKIN:       No rash  or lesions        ASSESSMENT:  Diabetes type 2, uncontrolled with minimal obesity and A1c recently 9-10% range Although she does have some insulin resistance she is only taking basal insulin and only checking her sugar in the morning fasting Discussed that she likely has significant postprandial hyperglycemia also which is not controlled She does need better me planning and agrees to see the dietitian Although she does not exercise much she is relatively active for her age and other medical problems Also unlikely that Amaryl is effective in her pancreatic insulin secretion and she will need mealtime insulin coverage She is also a candidate for either adding either Victoza or Invokana but the cost of this would be prohibitive since she is in the donut hole  Complications: Peripheral neuropathy with mild sensory loss Microalbuminuria needs to be ruled out She has had macrovascular disease with CAD  Hypertension: Well controlled, she is also on an ACE inhibitor  Hyperlipidemia: Well controlled on Crestor  PLAN:   Start regular insulin before meals, she can start doing this with her evening meal first. Discussed timing of the injection as well as action to control postprandial hyperglycemia as well as duration of action and potential for hypoglycemia. Discussed adjusting the dose based on postprandial readings in the next few days  She will followup with nurse educator for more detailed  diabetes education including foot care as well as discussion on adjusting her insulin doses  She will need to check her blood sugars after breakfast or lunch by rotation also and consider mealtime coverage at those meals also  Potentially she could be a candidate for a V go pump which would be more convenient and effective, cost may be a factor  Better meal planning and balanced meals  More consistent walking for exercise  Consider Victoza in January when she is not concerned about out-of-pocket expense  Patient Instructions  Please check blood sugars at least half the time about 2 hours after any meal and 3-4 times a week on waking up.  Please bring blood sugar monitor to each visit  Continue Lantus 50 units at night REGULAR insulin: Take this 15-30 minutes before evening meal. Start taking 8 units tonight  If the blood sugar about 2 hours after her evening meal is still over 200 by Sunday increase the regular insulin by 2 units  Walk as tolerated daily Avoid high fat and high carbohydrate meals, make sure you have some protein at each meal  Counseling time over 50% of today's 60 minute visit   Devaris Quirk 09/18/2013, 9:23 AM   Note: This office note was prepared with Estate agent. Any transcriptional errors that result from this process are unintentional.

## 2013-09-18 NOTE — Patient Instructions (Signed)
Please check blood sugars at least half the time about 2 hours after any meal and 3-4 times a week on waking up.  Please bring blood sugar monitor to each visit  Continue Lantus 50 units at night REGULAR insulin: Take this 15-30 minutes before evening meal. Start taking 8 units tonight  If the blood sugar about 2 hours after her evening meal is still over 200 by Sunday increase the regular insulin by 2 units  Walk as tolerated daily Avoid high fat and high carbohydrate meals, make sure you have some protein at each meal

## 2013-09-23 ENCOUNTER — Other Ambulatory Visit (INDEPENDENT_AMBULATORY_CARE_PROVIDER_SITE_OTHER): Payer: Medicare Other

## 2013-09-23 ENCOUNTER — Other Ambulatory Visit: Payer: Self-pay | Admitting: Pulmonary Disease

## 2013-09-23 ENCOUNTER — Encounter: Payer: Medicare Other | Attending: Endocrinology | Admitting: Nutrition

## 2013-09-23 VITALS — Wt 168.2 lb

## 2013-09-23 DIAGNOSIS — IMO0001 Reserved for inherently not codable concepts without codable children: Secondary | ICD-10-CM | POA: Diagnosis not present

## 2013-09-23 DIAGNOSIS — E1165 Type 2 diabetes mellitus with hyperglycemia: Principal | ICD-10-CM

## 2013-09-23 DIAGNOSIS — Z713 Dietary counseling and surveillance: Secondary | ICD-10-CM | POA: Diagnosis not present

## 2013-09-23 DIAGNOSIS — Z794 Long term (current) use of insulin: Secondary | ICD-10-CM | POA: Insufficient documentation

## 2013-09-23 LAB — BASIC METABOLIC PANEL
BUN: 26 mg/dL — AB (ref 6–23)
CALCIUM: 9.8 mg/dL (ref 8.4–10.5)
CO2: 25 mEq/L (ref 19–32)
Chloride: 106 mEq/L (ref 96–112)
Creatinine, Ser: 1.3 mg/dL — ABNORMAL HIGH (ref 0.4–1.2)
GFR: 41.44 mL/min — ABNORMAL LOW (ref >60.00)
GLUCOSE: 213 mg/dL — AB (ref 70–99)
Potassium: 4.2 mEq/L (ref 3.5–5.1)
Sodium: 137 mEq/L (ref 135–145)

## 2013-09-23 LAB — MICROALBUMIN / CREATININE URINE RATIO
Creatinine,U: 91.8 mg/dL
Microalb Creat Ratio: 4.7 mg/g (ref 0.0–30.0)
Microalb, Ur: 4.3 mg/dL — ABNORMAL HIGH (ref 0.0–1.9)

## 2013-09-23 NOTE — Progress Notes (Signed)
Pt. Reports that she has reduced the amount of carbs eaten in the past 2 weeks.  Wt. Is down 2.5 pounds since last visit.  Gave much praise for this. Taking Lantus: qHS as directed, but only taking Humalog for 2 meals, not three. Blood sugars Fasting: low 200s, today 176.  2hr. pc still [id number].   Discussed the need to take the Humalog  Before all meals as directed.  She agreed to do this.   REviewed the need to wait 30 min. After injecting before eating, and she is doing this.

## 2013-09-23 NOTE — Patient Instructions (Signed)
Take Humalog 30 min. Before all meals Increase the dose to 10u if the 2hr. After meals are still over 200.

## 2013-09-26 ENCOUNTER — Other Ambulatory Visit: Payer: Self-pay

## 2013-09-26 ENCOUNTER — Telehealth: Payer: Self-pay | Admitting: Internal Medicine

## 2013-09-26 DIAGNOSIS — E119 Type 2 diabetes mellitus without complications: Secondary | ICD-10-CM

## 2013-09-26 MED ORDER — CLORAZEPATE DIPOTASSIUM 7.5 MG PO TABS: 7.5000 mg | ORAL_TABLET | ORAL | Status: DC | PRN

## 2013-09-26 MED ORDER — OMEPRAZOLE 40 MG PO CPDR: 40.0000 mg | DELAYED_RELEASE_CAPSULE | Freq: Every day | ORAL | Status: DC

## 2013-09-26 MED ORDER — HYDROCHLOROTHIAZIDE 25 MG PO TABS: 12.5000 mg | ORAL_TABLET | Freq: Every day | ORAL | Status: DC

## 2013-09-26 MED ORDER — CLOPIDOGREL BISULFATE 75 MG PO TABS: ORAL_TABLET | ORAL | Status: DC

## 2013-09-26 MED ORDER — RAMIPRIL 10 MG PO CAPS: ORAL_CAPSULE | ORAL | Status: DC

## 2013-09-26 MED ORDER — INSULIN GLARGINE 100 UNIT/ML SOLOSTAR PEN: 50.0000 [IU] | PEN_INJECTOR | Freq: Every day | SUBCUTANEOUS | Status: DC

## 2013-09-26 MED ORDER — ATENOLOL 50 MG PO TABS: ORAL_TABLET | ORAL | Status: DC

## 2013-09-26 MED ORDER — DICYCLOMINE HCL 20 MG PO TABS: 20.0000 mg | ORAL_TABLET | Freq: Three times a day (TID) | ORAL | Status: DC | PRN

## 2013-09-26 MED ORDER — ROSUVASTATIN CALCIUM 10 MG PO TABS: 10.0000 mg | ORAL_TABLET | Freq: Every day | ORAL | Status: DC

## 2013-09-26 MED ORDER — NIACIN ER (ANTIHYPERLIPIDEMIC) 500 MG PO TBCR: EXTENDED_RELEASE_TABLET | ORAL | Status: DC

## 2013-09-26 MED ORDER — GLIMEPIRIDE 4 MG PO TABS: ORAL_TABLET | ORAL | Status: DC

## 2013-09-26 MED ORDER — INSULIN REGULAR HUMAN 100 UNIT/ML IJ SOLN: 8.0000 [IU] | Freq: Three times a day (TID) | INTRAMUSCULAR | Status: DC

## 2013-09-26 NOTE — Telephone Encounter (Signed)
Caller name: Coral Relation to pt: self  Call back number: [phone] Pharmacy: [facility] on [address]  Reason for call:    Patient states that she needs refills of all medications to be sent in.

## 2013-09-26 NOTE — Telephone Encounter (Signed)
All medications refilled.

## 2013-09-29 ENCOUNTER — Other Ambulatory Visit: Payer: Self-pay | Admitting: Pulmonary Disease

## 2013-10-09 ENCOUNTER — Telehealth: Payer: Self-pay | Admitting: Internal Medicine

## 2013-10-09 MED ORDER — INSULIN PEN NEEDLE 32G X 6 MM MISC: Status: DC

## 2013-10-09 NOTE — Telephone Encounter (Signed)
Insulin needles sent to [facility].

## 2013-10-09 NOTE — Telephone Encounter (Signed)
LMOM at [place] for [facility] to call back regarding medication.

## 2013-10-09 NOTE — Telephone Encounter (Signed)
Caller name: Eloni  Call back McCreary at [place] [facility]   Reason for call:  Pt is needing these RX's sent to the pharmacy:  Insulin Pen Needle (NOVOFINE) 32G X 6 MM MISC

## 2013-10-09 NOTE — Telephone Encounter (Signed)
Please call Megan at [facility]. Chelsea needs clarification regarding this medication that was sent in. Thanks!

## 2013-10-16 ENCOUNTER — Other Ambulatory Visit: Payer: Self-pay | Admitting: *Deleted

## 2013-10-16 ENCOUNTER — Ambulatory Visit (INDEPENDENT_AMBULATORY_CARE_PROVIDER_SITE_OTHER): Payer: Medicare Other | Admitting: Endocrinology

## 2013-10-16 ENCOUNTER — Encounter: Payer: Self-pay | Admitting: Endocrinology

## 2013-10-16 VITALS — BP 138/82 | HR 62 | Temp 97.8°F | Resp 16 | Ht 66.0 in | Wt 170.4 lb

## 2013-10-16 DIAGNOSIS — I2510 Atherosclerotic heart disease of native coronary artery without angina pectoris: Secondary | ICD-10-CM | POA: Diagnosis not present

## 2013-10-16 DIAGNOSIS — I10 Essential (primary) hypertension: Secondary | ICD-10-CM | POA: Diagnosis not present

## 2013-10-16 DIAGNOSIS — E1165 Type 2 diabetes mellitus with hyperglycemia: Secondary | ICD-10-CM | POA: Diagnosis not present

## 2013-10-16 DIAGNOSIS — IMO0002 Reserved for concepts with insufficient information to code with codable children: Secondary | ICD-10-CM

## 2013-10-16 MED ORDER — GLUCOSE BLOOD VI STRP: ORAL_STRIP | Status: DC

## 2013-10-16 NOTE — Patient Instructions (Addendum)
Please check blood sugars at least half the time about 2 hours after any meal and 4-5 times per week on waking up. Please bring blood sugar monitor to each visit  Regular insulin 12 units before each meal; sugars after meals should be under 180  Metformin 500mg  twice daily, stop Glimeperide

## 2013-10-16 NOTE — Progress Notes (Signed)
Patient ID: [employer], female   DOB: 1940/02/28, 73 y.o.   MRN: [id number]            Reason for Appointment: Followup for Type 2 Diabetes  Referring physician:  History of Present Illness:          Diagnosis: Type 2 diabetes mellitus, date of diagnosis: 1998       Past history:   She was treated with metformin at diagnosis when this had been continued until 2015 Subsequently Amaryl was also added several years ago and this has been continued Her blood sugars were under fair control between 2010 and early 2013 with A1c ranging from 7.4-9.4, mostly under 8% However since 08/2011 her A1c has been mostly over 8%  Insulin was added in 2014 with small doses of Lantus and this has been progressively increased Metformin stopped in 8/15 because of renal dysfunction and her insulin dose was increased from 40 up to 50 units of Lantus once a day  Recent history:  She was started on mealtime insulin when her initial consultation in 9/15 She was seen by the nurse educator in followup and was advised to increase her dose to 10 units or more if her postprandial readings were high However she still does not understand the concept of mealtime insulin and postprandial hyperglycemia She is only taking her 8 units and generally taking it at breakfast and suppertime  She will occasionally forget the doses especially when not at home Despite her blood sugars being as high as 334 after eating she has not increased her mealtime dose She has however started checking her blood sugars after meals periodically and mostly after dinner Her blood sugar is still averaging over 200 at home Fasting blood sugars: Not clear why these are variable, she has been consistent with her Lantus; overall are slightly better       Oral hypoglycemic drugs the patient is taking are: Amaryl 4 mg twice a day      Side effects from medications have been: None INSULIN regimen is described as: 50 units Lantus, Regular 8  units Compliance with the medical regimen: good  Hypoglycemia: none  Glucose monitoring:  done one time a day         Glucometer: One Touch.      Blood Glucose readings by time of day   PREMEAL Breakfast  PCL   pcs Overall  Glucose range:  76-239   231, 314    179-334    Mean/median:  162     242  198    Self-care: The diet that the patient has been following is: none, usually eating low fat meals Meals: 2-3 meals per day at irregular times.  Breakfast is a Cookie, usually eating sandwiches for meals, snacks will be with crackers or ice cream           Exercise: Walking at times and doing some household activities         [place] visit, most recent: never               Weight history: 150-170 previously  Wt Readings from Last 3 Encounters:  10/16/13 170 lb 6.4 oz (77.293 kg)  09/23/13 168 lb 3.2 oz (76.295 kg)  09/18/13 170 lb 9.6 oz (77.384 kg)    Glycemic control:   Lab Results  Component Value Date   HGBA1C 10.2* 08/26/2013   HGBA1C 9.2* 11/12/2012   HGBA1C 8.9* 08/21/2012   Lab Results  Component Value Date  MICROALBUR 4.3* 09/23/2013   LDLCALC 25 03/25/2013   CREATININE 1.3* 09/23/2013        Medication List       This list is accurate as of: 10/16/13  3:31 PM.  Always use your most recent med list.               aspirin 325 MG tablet  Take 325 mg by mouth daily.     atenolol 50 MG tablet  Commonly known as:  TENORMIN  TAKE 1 TABLET BY MOUTH DAILY     cholecalciferol 1000 UNITS tablet  Commonly known as:  VITAMIN D  Take 1,000 Units by mouth daily.     clopidogrel 75 MG tablet  Commonly known as:  PLAVIX  TAKE 1 TABLET BY MOUTH DAILY     clorazepate 7.5 MG tablet  Commonly known as:  TRANXENE  Take 1 tablet (7.5 mg total) by mouth as needed.     dicyclomine 20 MG tablet  Commonly known as:  BENTYL  Take 1 tablet (20 mg total) by mouth 3 (three) times daily as needed (for abdominal cramping).     glucose blood test strip  Commonly known  as:  ONE TOUCH ULTRA TEST  USE AS DIRECTED to check blood sugar 3 times per day dx code E11.65     hydrochlorothiazide 25 MG tablet  Commonly known as:  HYDRODIURIL  Take 0.5 tablets (12.5 mg total) by mouth daily.     HYDROcodone-acetaminophen 5-325 MG per tablet  Commonly known as:  NORCO/VICODIN  Take 1 tablet by mouth 3 (three) times daily as needed.     Insulin Glargine 100 UNIT/ML Solostar Pen  Commonly known as:  LANTUS  Inject 50 Units into the skin at bedtime.     Insulin Pen Needle 32G X 6 MM Misc  Commonly known as:  NOVOFINE  Use as directed to check blood sugar     insulin regular 100 units/mL injection  Commonly known as:  HUMULIN R  Inject 0.08 mLs (8 Units total) into the skin 3 (three) times daily before meals.     INSULIN SYRINGE .3CC/31GX5/16" 31G X 5/16" 0.3 ML Misc  Use three needles per day     multivitamin capsule  Take 1 capsule by mouth daily.     neomycin-polymyxin-hydrocortisone otic solution  Commonly known as:  CORTISPORIN  2-3 drops in each ear three times daily     niacin 500 MG CR tablet  Commonly known as:  NIASPAN  TAKE 1 TABLET BY MOUTH EVERY DAY     omeprazole 40 MG capsule  Commonly known as:  PRILOSEC  Take 1 capsule (40 mg total) by mouth daily.     ONE TOUCH ULTRA SYSTEM KIT W/DEVICE Kit  1 kit by Does not apply route once.     ONETOUCH DELICA LANCETS 58N Misc  1 Device by Does not apply route every morning.     ramipril 10 MG capsule  Commonly known as:  ALTACE  TAKE ONE CAPSULE BY MOUTH DAILY     rosuvastatin 10 MG tablet  Commonly known as:  CRESTOR  Take 1 tablet (10 mg total) by mouth daily.        Allergies:  Allergies  Allergen Reactions  . Codeine     REACTION: makes her nervous  . Ibuprofen     REACTION: nervous  . Meperidine Hcl     REACTION: nasuea and vomitting  . Naproxen Sodium     REACTION: nervous  Past Medical History  Diagnosis Date  . Hypertension   . CAD (coronary artery disease)      Dr Gwenlyn Found  . Venous insufficiency   . Hyperlipidemia   . Diabetes mellitus   . GERD (gastroesophageal reflux disease)   . Gastritis   . Hx of colonic polyps   . Malignant neoplasm of small intestine   . Ventral hernia   . DJD (degenerative joint disease)   . Headache(784.0)   . Depression   . History of shingles   . History of anemia   . Vitamin B12 deficiency     Past Surgical History  Procedure Laterality Date  . Cabg x 5  1997  . Cesarean section    . Left knee surgery    . Tonsillectomy    . Tubal ligation    . 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 March Rummage  . Lap ventral hernia repair with incarcerated colon  09/2008    Dr. Lucia Gaskins    No family history on file.  Social History:  reports that she has never smoked. She has never used smokeless tobacco. She reports that she does not drink alcohol or use illicit drugs.    Review of Systems       Vision is normal. Most recent eye exam was in 2013       Lipids: Has had excellent control of hypercholesterolemia with Crestor which she has taken for several years, on lipid-lowering therapy since her coronary bypass surgery       Lab Results  Component Value Date   CHOL 104 03/25/2013   HDL 40 03/25/2013   LDLCALC 25 03/25/2013   LDLDIRECT 43.1 05/08/2012   TRIG 193* 03/25/2013   CHOLHDL 2.6 03/25/2013                 The blood pressure has been well controlled and she is taking HCTZ, ramipril and atenolol       No history of Numbness, tingling or burning in feet but occasional tingling in her left leg which has had severe injuries in the past Diabetic foot exam in 9/15 showed:  Decreased monofilament in toes and plantar surfaces, skin exam is normal. Pedal pulses: absent left DP    Physical Examination:  BP 138/82  Pulse 62  Temp(Src) 97.8 F (36.6 C)  Resp 16  Ht 5' 6"  (1.676 m)  Wt 170 lb 6.4 oz (77.293 kg)  BMI 27.52 kg/m2  SpO2 96%       not indicated    ASSESSMENT:  Diabetes type 2,  uncontrolled with BMI 27 and A1c recently 9-10% range She has started taking small doses of mealtime insulin but her sugars are still not controlled She does have some insulin resistance and is inquiring about 50 units of basal insulin Discussed with her that she would probably need about 25 or more units of mealtime coverage for control Also emphasized the need to control postprandial readings with her regular insulin with target of at least 180 or less Also discussed that since her renal function is nearly normal she should be able to take metformin safely and this will benefit her especially because of the potential for continued weight gain with increasing insulin doses She is not benefiting from Amaryl and can stop this  She is also a candidate for the V.-go pump which will simplify her management  and improve her control and compliance This was demonstrated to her and discussed advantages  PLAN:  Start increasing the regular insulin to 12 units and may need further adjustment based on the two-hour glucose  More blood sugars after breakfast  Bring blood sugar meter on each visit  Needs followup eye exam  Brochures on V.-go pump given and she will call for insurance benefit determination and if interested contact the nurse educator to train her on this  Patient Instructions  Please check blood sugars at least half the time about 2 hours after any meal and 4-5 times per week on waking up. Please bring blood sugar monitor to each visit  Regular insulin 12 units before each meal; sugars after meals should be under 180  Metformin 59m twice daily, stop Glimeperide  Counseling time over 50% of today's 25 minute visit   [name] 10/16/2013, 3:31 PM   Note: This office note was prepared with [employer] agent Any transcriptional errors that result from this process are unintentional. workup

## 2013-10-22 ENCOUNTER — Other Ambulatory Visit: Payer: Self-pay | Admitting: *Deleted

## 2013-10-22 ENCOUNTER — Telehealth: Payer: Self-pay | Admitting: Endocrinology

## 2013-10-22 MED ORDER — GLUCOSE BLOOD VI STRP: ORAL_STRIP | Status: DC

## 2013-10-22 NOTE — Telephone Encounter (Signed)
rx faxed

## 2013-10-22 NOTE — Telephone Encounter (Signed)
Pt needs new rx for her test strip at least 3 per day one touch ultra blue. Called into [facility] on [address]

## 2013-10-25 ENCOUNTER — Other Ambulatory Visit: Payer: Self-pay | Admitting: Internal Medicine

## 2013-10-30 ENCOUNTER — Telehealth: Payer: Self-pay | Admitting: Hematology & Oncology

## 2013-10-30 NOTE — Telephone Encounter (Signed)
Left vm w NEW PATIENT today to remind them of their appointment with Dr. Ennever. Also, advised them to bring all medication bottles and insurance card information. ° °

## 2013-10-31 ENCOUNTER — Ambulatory Visit: Payer: Medicare Other

## 2013-10-31 ENCOUNTER — Other Ambulatory Visit (HOSPITAL_BASED_OUTPATIENT_CLINIC_OR_DEPARTMENT_OTHER): Payer: Medicare Other | Admitting: Lab

## 2013-10-31 ENCOUNTER — Encounter: Payer: Self-pay | Admitting: Family

## 2013-10-31 ENCOUNTER — Other Ambulatory Visit (HOSPITAL_COMMUNITY)
Admission: RE | Admit: 2013-10-31 | Discharge: 2013-10-31 | Disposition: A | Discharge status: Home / Self Care | Payer: Medicare Other | Source: Ambulatory Visit | Attending: Hematology & Oncology | Admitting: Hematology & Oncology

## 2013-10-31 ENCOUNTER — Ambulatory Visit (HOSPITAL_BASED_OUTPATIENT_CLINIC_OR_DEPARTMENT_OTHER): Payer: Medicare Other | Admitting: Family

## 2013-10-31 VITALS — BP 183/81 | HR 59 | Temp 97.8°F | Resp 20 | Ht 66.0 in | Wt 170.0 lb

## 2013-10-31 DIAGNOSIS — C9110 Chronic lymphocytic leukemia of B-cell type not having achieved remission: Secondary | ICD-10-CM | POA: Diagnosis not present

## 2013-10-31 DIAGNOSIS — Z85038 Personal history of other malignant neoplasm of large intestine: Secondary | ICD-10-CM | POA: Diagnosis not present

## 2013-10-31 DIAGNOSIS — D7289 Other specified disorders of white blood cells: Secondary | ICD-10-CM | POA: Diagnosis not present

## 2013-10-31 LAB — CHCC SATELLITE - SMEAR

## 2013-10-31 LAB — CBC WITH DIFFERENTIAL (CANCER CENTER ONLY)
BASO#: 0 10*3/uL (ref 0.0–0.2)
BASO%: 0.2 % (ref 0.0–2.0)
EOS%: 1.3 % (ref 0.0–7.0)
Eosinophils Absolute: 0.2 10*3/uL (ref 0.0–0.5)
HCT: 36.6 % (ref 34.8–46.6)
HGB: 12.4 g/dL (ref 11.6–15.9)
LYMPH#: 9.3 10*3/uL — ABNORMAL HIGH (ref 0.9–3.3)
LYMPH%: 61.9 % — ABNORMAL HIGH (ref 14.0–48.0)
MCH: 31.6 pg (ref 26.0–34.0)
MCHC: 33.9 g/dL (ref 32.0–36.0)
MCV: 93 fL (ref 81–101)
MONO#: 0.9 10*3/uL (ref 0.1–0.9)
MONO%: 6.2 % (ref 0.0–13.0)
NEUT#: 4.6 10*3/uL (ref 1.5–6.5)
NEUT%: 30.4 % — ABNORMAL LOW (ref 39.6–80.0)
PLATELETS: 186 10*3/uL (ref 145–400)
RBC: 3.92 10*6/uL (ref 3.70–5.32)
RDW: 12.7 % (ref 11.1–15.7)
WBC: 15.1 10*3/uL — AB (ref 3.9–10.0)

## 2013-10-31 LAB — CMP (CANCER CENTER ONLY)
ALT: 27 U/L (ref 10–47)
AST: 27 U/L (ref 11–38)
Albumin: 3.8 g/dL (ref 3.3–5.5)
Alkaline Phosphatase: 73 U/L (ref 26–84)
BILIRUBIN TOTAL: 0.6 mg/dL (ref 0.20–1.60)
BUN, Bld: 20 mg/dL (ref 7–22)
CALCIUM: 9.9 mg/dL (ref 8.0–10.3)
CHLORIDE: 101 meq/L (ref 98–108)
CO2: 26 meq/L (ref 18–33)
Creat: 1.1 mg/dl (ref 0.6–1.2)
GLUCOSE: 202 mg/dL — AB (ref 73–118)
Potassium: 4.1 mEq/L (ref 3.3–4.7)
Sodium: 141 mEq/L (ref 128–145)
Total Protein: 7.4 g/dL (ref 6.4–8.1)

## 2013-10-31 LAB — TECHNOLOGIST REVIEW CHCC SATELLITE

## 2013-11-03 ENCOUNTER — Ambulatory Visit (INDEPENDENT_AMBULATORY_CARE_PROVIDER_SITE_OTHER): Payer: Medicare Other | Admitting: Internal Medicine

## 2013-11-03 ENCOUNTER — Ambulatory Visit (INDEPENDENT_AMBULATORY_CARE_PROVIDER_SITE_OTHER): Payer: Medicare Other

## 2013-11-03 ENCOUNTER — Encounter: Payer: Self-pay | Admitting: Internal Medicine

## 2013-11-03 VITALS — BP 132/78 | HR 62 | Temp 97.8°F | Wt 169.4 lb

## 2013-11-03 DIAGNOSIS — M150 Primary generalized (osteo)arthritis: Secondary | ICD-10-CM

## 2013-11-03 DIAGNOSIS — I25810 Atherosclerosis of coronary artery bypass graft(s) without angina pectoris: Secondary | ICD-10-CM | POA: Diagnosis not present

## 2013-11-03 DIAGNOSIS — E1110 Type 2 diabetes mellitus with ketoacidosis without coma: Secondary | ICD-10-CM

## 2013-11-03 DIAGNOSIS — C9290 Myeloid leukemia, unspecified, not having achieved remission: Secondary | ICD-10-CM

## 2013-11-03 DIAGNOSIS — C9210 Chronic myeloid leukemia, BCR/ABL-positive, not having achieved remission: Secondary | ICD-10-CM

## 2013-11-03 DIAGNOSIS — Z23 Encounter for immunization: Secondary | ICD-10-CM

## 2013-11-03 DIAGNOSIS — Z79899 Other long term (current) drug therapy: Secondary | ICD-10-CM | POA: Diagnosis not present

## 2013-11-03 DIAGNOSIS — M159 Polyosteoarthritis, unspecified: Secondary | ICD-10-CM

## 2013-11-03 DIAGNOSIS — I2510 Atherosclerotic heart disease of native coronary artery without angina pectoris: Secondary | ICD-10-CM | POA: Diagnosis not present

## 2013-11-03 DIAGNOSIS — E1310 Other specified diabetes mellitus with ketoacidosis without coma: Secondary | ICD-10-CM

## 2013-11-03 DIAGNOSIS — Z79891 Long term (current) use of opiate analgesic: Secondary | ICD-10-CM | POA: Diagnosis not present

## 2013-11-03 DIAGNOSIS — I10 Essential (primary) hypertension: Secondary | ICD-10-CM

## 2013-11-03 LAB — FLOW CYTOMETRY - CHCC SATELLITE

## 2013-11-03 MED ORDER — HYDROCODONE-ACETAMINOPHEN 5-325 MG PO TABS: 1.0000 | ORAL_TABLET | Freq: Three times a day (TID) | ORAL | Status: DC | PRN

## 2013-11-03 MED ORDER — BETAMETHASONE DIPROPIONATE 0.05 % EX CREA: TOPICAL_CREAM | Freq: Two times a day (BID) | CUTANEOUS | Status: DC

## 2013-11-03 NOTE — Progress Notes (Signed)
Hematology/Oncology Consultation   Name: [name]      MRN: [id number]    Location: Room/bed info not found  Date: 11/03/2013 Time:9:00 AM   REFERRING PHYSICIAN:  Dresser  REASON FOR CONSULT:  Leukocytosis   DIAGNOSIS:  CLL  HISTORY OF PRESENT ILLNESS:  [name] is a very pleasant female with a recent history of leukocytosis (11.1-15.1). She is asymptomatic at this time and has no complaints. She has a history of colon cancer. This was diagnosed in 2000. She had 6 months of 5FU chemo and a colon resection. She has had no issues since then. She has had no infections. She does not smoke or drink alcohol. She has no bleeding or clotting disorders that she knows of. She Her last mammogram was on 2013 and she states that it was negative. She is a retired Scientist, water quality. She is [place]. She is widowed and has 2 living children. She had one child that was stillborn, one that passed away when they were 82 days old and unfortunately a son that committed suicide years ago. She has no other cancer history in the family.  She denies fever, chills, n/v, cough, rash, headache, dizziness, SOB, chest pain, palpitations, abdominal pain, constipation, diarrhea, blood in urine or stool. She has had no swelling, tenderness, numbness or tingling in her extremities. She has had no bleeding or pain. Her appetite is good and she is well hydrated. Her weight is stable.     ROS: All other 10 point review of systems is negative.   PAST MEDICAL HISTORY:   Past Medical History  Diagnosis Date  . Hypertension   . CAD (coronary artery disease)     Dr Gwenlyn Found  . Venous insufficiency   . Hyperlipidemia   . Diabetes mellitus   . GERD (gastroesophageal reflux disease)   . Gastritis   . Hx of colonic polyps   . Malignant neoplasm of small intestine   . Ventral hernia   . DJD (degenerative joint disease)   . Headache(784.0)   . Depression   . History of shingles   . History of anemia   . Vitamin B12  deficiency     ALLERGIES: Allergies  Allergen Reactions  . Codeine     REACTION: makes her nervous  . Ibuprofen     REACTION: nervous  . Meperidine Hcl     REACTION: nasuea and vomitting  . Naproxen Sodium     REACTION: nervous      MEDICATIONS:  Current Outpatient Prescriptions on File Prior to Visit  Medication Sig Dispense Refill  . aspirin 325 MG tablet Take 325 mg by mouth daily.    Marland Kitchen atenolol (TENORMIN) 50 MG tablet TAKE 1 TABLET BY MOUTH DAILY 90 tablet 1  . Blood Glucose Monitoring Suppl (ONE TOUCH ULTRA SYSTEM KIT) W/DEVICE KIT 1 kit by Does not apply route once. 1 each 0  . cholecalciferol (VITAMIN D) 1000 UNITS tablet Take 1,000 Units by mouth daily.      . clopidogrel (PLAVIX) 75 MG tablet TAKE 1 TABLET BY MOUTH DAILY 90 tablet 1  . clorazepate (TRANXENE) 7.5 MG tablet Take 1 tablet (7.5 mg total) by mouth as needed. 30 tablet 5  . dicyclomine (BENTYL) 20 MG tablet Take 1 tablet (20 mg total) by mouth 3 (three) times daily as needed (for abdominal cramping). 50 tablet 5  . glucose blood (ONE TOUCH ULTRA TEST) test strip USE AS DIRECTED to check blood sugar 3 times per day dx code  E11.65 100 each 3  . hydrochlorothiazide (HYDRODIURIL) 25 MG tablet Take 0.5 tablets (12.5 mg total) by mouth daily. 90 tablet 1  . HYDROcodone-acetaminophen (NORCO/VICODIN) 5-325 MG per tablet Take 1 tablet by mouth 3 (three) times daily as needed. 90 tablet 0  . Insulin Glargine (LANTUS) 100 UNIT/ML Solostar Pen Inject 50 Units into the skin at bedtime. 15 mL 3  . Insulin Pen Needle (NOVOFINE) 32G X 6 MM MISC Use as directed to check blood sugar 100 each 12  . insulin regular (HUMULIN R) 100 units/mL injection Inject 0.08 mLs (8 Units total) into the skin 3 (three) times daily before meals. 10 mL 3  . Insulin Syringe-Needle U-100 (INSULIN SYRINGE .3CC/31GX5/16") 31G X 5/16" 0.3 ML MISC Use three needles per day 100 each 3  . LANTUS SOLOSTAR 100 UNIT/ML Solostar Pen INJECT 50 UNITS INTO THE SKIN  AT BEDTIME 15 mL 2  . Multiple Vitamin (MULTIVITAMIN) capsule Take 1 capsule by mouth daily.      Marland Kitchen neomycin-polymyxin-hydrocortisone (CORTISPORIN) otic solution 2-3 drops in each ear three times daily 10 mL 2  . niacin (NIASPAN) 500 MG CR tablet TAKE 1 TABLET BY MOUTH EVERY DAY 90 tablet 0  . omeprazole (PRILOSEC) 40 MG capsule Take 1 capsule (40 mg total) by mouth daily. 90 capsule 1  . ONETOUCH DELICA LANCETS 44R MISC 1 Device by Does not apply route every morning. 100 each 12  . ramipril (ALTACE) 10 MG capsule TAKE ONE CAPSULE BY MOUTH DAILY 90 capsule 1  . rosuvastatin (CRESTOR) 10 MG tablet Take 1 tablet (10 mg total) by mouth daily. 90 tablet 1   No current facility-administered medications on file prior to visit.     PAST SURGICAL HISTORY Past Surgical History  Procedure Laterality Date  . Cabg x 5  1997  . Cesarean section    . Left knee surgery    . Tonsillectomy    . Tubal ligation    . 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 March Rummage  . Lap ventral hernia repair with incarcerated colon  09/2008    Dr. Lucia Gaskins    FAMILY HISTORY: No family history on file.  SOCIAL HISTORY:  reports that she has never smoked. She has never used smokeless tobacco. She reports that she does not drink alcohol or use illicit drugs.  PERFORMANCE STATUS: The patient's performance status is 0 - Asymptomatic  PHYSICAL EXAM: Most Recent Vital Signs: Blood pressure 183/81, pulse 59, temperature 97.8 F (36.6 C), resp. rate 20, height 5' 6"  (1.676 m), weight 170 lb (77.111 kg). BP 183/81 mmHg  Pulse 59  Temp(Src) 97.8 F (36.6 C)  Resp 20  Ht 5' 6"  (1.676 m)  Wt 170 lb (77.111 kg)  BMI 27.45 kg/m2  General Appearance:    Alert, cooperative, no distress, appears stated age  Head:    Normocephalic, without obvious abnormality, atraumatic  Eyes:    PERRL, conjunctiva/corneas clear, EOM's intact, fundi    benign, both eyes        Throat:   Lips, mucosa, and tongue normal; teeth and  gums normal  Neck:   Supple, symmetrical, trachea midline, no adenopathy;    thyroid:  no enlargement/tenderness/nodules; no carotid   bruit or JVD  Back:     Symmetric, no curvature, ROM normal, no CVA tenderness  Lungs:     Clear to auscultation bilaterally, respirations unlabored  Chest Wall:    No tenderness or deformity   Heart:  Regular rate and rhythm, S1 and S2 normal, no murmur, rub   or gallop     Abdomen:     Soft, non-tender, bowel sounds active all four quadrants,    no masses, no organomegaly        Extremities:   Extremities normal, atraumatic, no cyanosis or edema  Pulses:   2+ and symmetric all extremities  Skin:   Skin color, texture, turgor normal, no rashes or lesions  Lymph nodes:   Cervical, supraclavicular, and axillary nodes normal  Neurologic:   CNII-XII intact, normal strength, sensation and reflexes    throughout   LABORATORY DATA:  No results found for this or any previous visit (from the past 48 hour(s)).    RADIOGRAPHY: No results found.     PATHOLOGY: None  ASSESSMENT/PLAN: Ms. Cumba is a very pleasant female with a recent history of leukocytosis (11.1-15.1). She is asymptomatic at this time and has no complaints. She has a history of colon cancer. This was diagnosed in 2000. She had 6 months of 5FU chemo and a colon resection.  Her blood smear showed lymphocytosis and atypical lymphocytes. Her WBCs today are 15.1. 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 We will wait and see what the rest of her labs show.   At this time we will continue to monitor her closely.  We will see her back in 4 months for labs and follow-up.  All questions were answered. The patient knows to call the clinic with any problems, questions or concerns. We can certainly see the patient much sooner if necessary. The patient was discussed with and also seen by Dr. Marin Olp and he is in agreement with the aforementioned.   [facility]   Addendum:   I saw and examined the  patient with Sarah. 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 This was confirmed by flow cytometry.  Her blood smear is definitely consistent with CLL. She has a lot of smudge cells. She has some atypical lymphocytes. There is no schistocytes or spherocytes. I see no blasts. Platelets looked okay.  Her labs look pretty good. She is asymptomatic.  Her physical exam is pretty much unremarkable. There is no lymphadenopathy. There is no palpable splenomegaly. She has no rashes.  Neurological exam is pretty much non-focal.  We can just follow for right now. There is no indication for [place] to do any type of treatment.  We spent about 45 minutes with her. We reviewed the lab her with her. We explained what CLL was.  We reassured her that everything was going to be okay and that we hopefully will not have to do any treatments for a couple years.  We will plan to see her back in another 4 months.

## 2013-11-03 NOTE — Patient Instructions (Addendum)
Please go to the lab and get a urine sample (UDS)     Please come back to the office in 6 months for a physical exam. Come back fasting

## 2013-11-03 NOTE — Progress Notes (Signed)
Pre visit review using our clinic review tool, if applicable. No additional management support is needed unless otherwise documented below in the visit note. 

## 2013-11-03 NOTE — Assessment & Plan Note (Signed)
Now under the care of endocrinology, on insulin

## 2013-11-03 NOTE — Assessment & Plan Note (Signed)
BP well controlled, recent BMP okay, no change

## 2013-11-03 NOTE — Progress Notes (Signed)
Subjective:    Patient ID: [employer], female    DOB: 08-19-1940, 73 y.o.   MRN: [id number]  DOS:  11/03/2013 Type of visit - description : rov Interval history: Since the last time she was here, she was diagnosed with CML. Good medication compliance Currently under the care off endocrinology, CBGs in the morning is still slightly elevated in the 150s 160s. DJD, needs a refill on hydrocodone.    ROS Denies chest pain or difficulty breathing Occasionally has heartburn but no dysphagia or odynophagia   Past Medical History  Diagnosis Date  . Hypertension   . CAD (coronary artery disease)     Dr Gwenlyn Found  . Venous insufficiency   . Hyperlipidemia   . Diabetes mellitus   . GERD (gastroesophageal reflux disease)   . Gastritis   . Hx of colonic polyps   . Malignant neoplasm of small intestine   . Ventral hernia   . DJD (degenerative joint disease)   . Headache(784.0)   . Depression   . History of shingles   . History of anemia   . Vitamin B12 deficiency     Past Surgical History  Procedure Laterality Date  . Cabg x 5  1997  . Cesarean section    . Left knee surgery    . Tonsillectomy    . Tubal ligation    . 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 March Rummage  . Lap ventral hernia repair with incarcerated colon  09/2008    Dr. Lucia Gaskins    History   Social History  . Marital Status: Widowed    Spouse Name: N/A    Number of Children: 2  . Years of Education: N/A   Occupational History  . retired-- [employer]    .     Social History Main Topics  . Smoking status: Never Smoker   . Smokeless tobacco: Never Used  . Alcohol Use: No  . Drug Use: No  . Sexual Activity: Not on file   Other Topics Concern  . Not on file   Social History Narrative   Lives by herself, lost husband ~ 2004        Medication List       This list is accurate as of: 11/03/13  6:34 PM.  Always use your most recent med list.               aspirin 325 MG tablet    Take 325 mg by mouth daily.     atenolol 50 MG tablet  Commonly known as:  TENORMIN  TAKE 1 TABLET BY MOUTH DAILY     betamethasone dipropionate 0.05 % cream  Commonly known as:  DIPROLENE  Apply topically 2 (two) times daily. To the L wrist x 10 days     cholecalciferol 1000 UNITS tablet  Commonly known as:  VITAMIN D  Take 1,000 Units by mouth daily.     clopidogrel 75 MG tablet  Commonly known as:  PLAVIX  TAKE 1 TABLET BY MOUTH DAILY     clorazepate 7.5 MG tablet  Commonly known as:  TRANXENE  Take 1 tablet (7.5 mg total) by mouth as needed.     dicyclomine 20 MG tablet  Commonly known as:  BENTYL  Take 1 tablet (20 mg total) by mouth 3 (three) times daily as needed (for abdominal cramping).     glucose blood test strip  Commonly known as:  ONE TOUCH ULTRA TEST  USE AS  DIRECTED to check blood sugar 3 times per day dx code E11.65     hydrochlorothiazide 25 MG tablet  Commonly known as:  HYDRODIURIL  Take 0.5 tablets (12.5 mg total) by mouth daily.     HYDROcodone-acetaminophen 5-325 MG per tablet  Commonly known as:  NORCO/VICODIN  Take 1 tablet by mouth 3 (three) times daily as needed.     Insulin Glargine 100 UNIT/ML Solostar Pen  Commonly known as:  LANTUS  Inject 50 Units into the skin at bedtime.     LANTUS SOLOSTAR 100 UNIT/ML Solostar Pen  Generic drug:  Insulin Glargine  INJECT 50 UNITS INTO THE SKIN AT BEDTIME     Insulin Pen Needle 32G X 6 MM Misc  Commonly known as:  NOVOFINE  Use as directed to check blood sugar     insulin regular 100 units/mL injection  Commonly known as:  HUMULIN R  Inject 0.08 mLs (8 Units total) into the skin 3 (three) times daily before meals.     INSULIN SYRINGE .3CC/31GX5/16" 31G X 5/16" 0.3 ML Misc  Use three needles per day     multivitamin capsule  Take 1 capsule by mouth daily.     neomycin-polymyxin-hydrocortisone otic solution  Commonly known as:  CORTISPORIN  2-3 drops in each ear three times daily      niacin 500 MG CR tablet  Commonly known as:  NIASPAN  TAKE 1 TABLET BY MOUTH EVERY DAY     omeprazole 40 MG capsule  Commonly known as:  PRILOSEC  Take 1 capsule (40 mg total) by mouth daily.     ONE TOUCH ULTRA SYSTEM KIT W/DEVICE Kit  1 kit by Does not apply route once.     ONETOUCH DELICA LANCETS 54S Misc  1 Device by Does not apply route every morning.     ramipril 10 MG capsule  Commonly known as:  ALTACE  TAKE ONE CAPSULE BY MOUTH DAILY     rosuvastatin 10 MG tablet  Commonly known as:  CRESTOR  Take 1 tablet (10 mg total) by mouth daily.           Objective:   Physical Exam BP 132/78 mmHg  Pulse 62  Temp(Src) 97.8 F (36.6 C) (Oral)  Wt 169 lb 6 oz (76.828 kg)  SpO2 98%  General -- alert, well-developed, NAD.  Lungs -- normal respiratory effort, no intercostal retractions, no accessory muscle use, and normal breath sounds.  Heart-- normal rate, regular rhythm, no murmur.   Extremities-- no pretibial edema bilaterally  Neurologic--  alert & oriented X3.   Psych-- Cognition and judgment appear intact. Cooperative with normal attention span and concentration. No anxious or depressed appearing.       Assessment & Plan:   Rash-- Also complaining of a itchy rash on the left wrist after she did yard work. There is a patch 32 cm at the left wrist of erythema, scaliness. Likely contact dermatitis: Prescribe beclomethasone

## 2013-11-03 NOTE — Assessment & Plan Note (Signed)
Refill hydrocortisone, check a UDS

## 2013-11-03 NOTE — Assessment & Plan Note (Addendum)
Due to leukocytosis, was seen by hematology, was given the diagnosis of CML. Patient is counseled, will see hematology again in February 2016

## 2013-11-04 LAB — PROTEIN ELECTROPHORESIS, SERUM, WITH REFLEX
ALPHA-1-GLOBULIN: 4.3 % (ref 2.9–4.9)
Albumin ELP: 55 % — ABNORMAL LOW (ref 55.8–66.1)
Alpha-2-Globulin: 12.8 % — ABNORMAL HIGH (ref 7.1–11.8)
Beta 2: 5.7 % (ref 3.2–6.5)
Beta Globulin: 7.2 % (ref 4.7–7.2)
GAMMA GLOBULIN: 15 % (ref 11.1–18.8)
Total Protein, Serum Electrophoresis: 6.9 g/dL (ref 6.0–8.3)

## 2013-11-04 LAB — HAPTOGLOBIN: HAPTOGLOBIN: 172 mg/dL (ref 45–215)

## 2013-11-04 LAB — LACTATE DEHYDROGENASE: LDH: 182 U/L (ref 94–250)

## 2013-11-04 LAB — IGG, IGA, IGM
IGG (IMMUNOGLOBIN G), SERUM: 999 mg/dL (ref 690–1700)
IgA: 277 mg/dL (ref 69–380)
IgM, Serum: 49 mg/dL — ABNORMAL LOW (ref 52–322)

## 2013-11-06 ENCOUNTER — Other Ambulatory Visit (INDEPENDENT_AMBULATORY_CARE_PROVIDER_SITE_OTHER): Payer: Medicare Other

## 2013-11-06 DIAGNOSIS — IMO0002 Reserved for concepts with insufficient information to code with codable children: Secondary | ICD-10-CM

## 2013-11-06 DIAGNOSIS — E1165 Type 2 diabetes mellitus with hyperglycemia: Secondary | ICD-10-CM | POA: Diagnosis not present

## 2013-11-06 LAB — COMPREHENSIVE METABOLIC PANEL
ALT: 21 U/L (ref 0–35)
AST: 25 U/L (ref 0–37)
Albumin: 3.5 g/dL (ref 3.5–5.2)
Alkaline Phosphatase: 64 U/L (ref 39–117)
BILIRUBIN TOTAL: 0.4 mg/dL (ref 0.2–1.2)
BUN: 24 mg/dL — AB (ref 6–23)
CO2: 27 mEq/L (ref 19–32)
CREATININE: 1.5 mg/dL — AB (ref 0.4–1.2)
Calcium: 9.7 mg/dL (ref 8.4–10.5)
Chloride: 106 mEq/L (ref 96–112)
GFR: 36.91 mL/min — ABNORMAL LOW (ref >60.00)
Glucose, Bld: 159 mg/dL — ABNORMAL HIGH (ref 70–99)
Potassium: 4.3 mEq/L (ref 3.5–5.1)
Sodium: 141 mEq/L (ref 135–145)
Total Protein: 7.4 g/dL (ref 6.0–8.3)

## 2013-11-06 LAB — MICROALBUMIN / CREATININE URINE RATIO
Creatinine,U: 164.4 mg/dL
MICROALB/CREAT RATIO: 5.1 mg/g (ref 0.0–30.0)
Microalb, Ur: 8.4 mg/dL — ABNORMAL HIGH (ref 0.0–1.9)

## 2013-11-06 LAB — HEMOGLOBIN A1C: Hgb A1c MFr Bld: 8.7 % — ABNORMAL HIGH (ref 4.6–6.5)

## 2013-11-13 ENCOUNTER — Ambulatory Visit: Payer: Medicare Other | Admitting: Endocrinology

## 2013-11-14 ENCOUNTER — Ambulatory Visit (INDEPENDENT_AMBULATORY_CARE_PROVIDER_SITE_OTHER): Payer: Medicare Other | Admitting: Endocrinology

## 2013-11-14 ENCOUNTER — Encounter: Payer: Self-pay | Admitting: Endocrinology

## 2013-11-14 VITALS — BP 128/82 | HR 66 | Temp 98.6°F | Resp 14 | Ht 66.0 in | Wt 168.2 lb

## 2013-11-14 DIAGNOSIS — I10 Essential (primary) hypertension: Secondary | ICD-10-CM | POA: Diagnosis not present

## 2013-11-14 DIAGNOSIS — E1165 Type 2 diabetes mellitus with hyperglycemia: Secondary | ICD-10-CM | POA: Diagnosis not present

## 2013-11-14 DIAGNOSIS — N289 Disorder of kidney and ureter, unspecified: Secondary | ICD-10-CM | POA: Diagnosis not present

## 2013-11-14 DIAGNOSIS — I2510 Atherosclerotic heart disease of native coronary artery without angina pectoris: Secondary | ICD-10-CM | POA: Diagnosis not present

## 2013-11-14 DIAGNOSIS — IMO0002 Reserved for concepts with insufficient information to code with codable children: Secondary | ICD-10-CM

## 2013-11-14 NOTE — Patient Instructions (Signed)
If sugar after supper stays over 180 go up to 14 regular at supper  Reduce Regular at breakfast to 10  Increase fluids by 2-3 cups daily  Lantus 48 units

## 2013-11-14 NOTE — Progress Notes (Signed)
Patient ID: [employer], female   DOB: 06/01/1940, 73 y.o.   MRN: [id number]            Reason for Appointment: Followup for Type 2 Diabetes  Referring physician: Larose Kells  History of Present Illness:          Diagnosis: Type 2 diabetes mellitus, date of diagnosis: 1998       Past history:   She was treated with metformin at diagnosis when this had been continued until 2015 Subsequently Amaryl was also added several years ago and this has been continued Her blood sugars were under fair control between 2010 and early 2013 with A1c ranging from 7.4-9.4, mostly under 8% However since 08/2011 her A1c has been mostly over 8%  Insulin was added in 2014 with small doses of Lantus and this has been progressively increased Metformin stopped in 8/15 because of renal dysfunction and her insulin dose was increased from 40 up to 50 units of Lantus once a day She was started on mealtime insulin on her initial consultation in 9/15 because of high postprandial readings, sometimes over 300  Recent history:  Her blood sugars are overall somewhat better than on her last visit She was told to increase her mealtime insulin, previously taking 8 units She is now taking 12 units of regular insulin before her main meals which are a late breakfast and suppertime  Also has been trying to be more consistent with taking the insulin She was also started on metformin 500 mg twice a day on her last visit since renal function was nearly normal For some reason her fasting blood sugars have improved significantly in the last 3 days, previously as high as 217 Postprandial readings: she is checking them mostly after her evening meal, about 2-3 hours after eating.  These are mostly high although this week blood sugar was 160 She has only one reading after breakfast which was 80; she usually has a small meal at breakfast She does not think she usually has lunch       Oral hypoglycemic drugs the patient is taking are:  Metform      Side effects from medications have been: None INSULIN regimen is described as: 50 units Lantus, Regular 12 units Compliance with the medical regimen: good  Hypoglycemia: none  Glucose monitoring:  done one time a day         Glucometer: One Touch.      Blood Glucose readings by time of day   PREMEAL Breakfast midday Dinner Bedtime Overall  Glucose range: 104-217 80-166 192 160-267   median: 158   183 178   Self-care: The diet that the patient has been following is: none, usually eating low fat meals Meals: 2-3 meals per day at irregular times. Dinner 7 pm Breakfast is a toast, sausage, usually eating sandwiches for meals, snacks will be with crackers or ice cream           Exercise: Walking at times and doing more household activities         [place] visit, most recent: never               Weight history: 150-170 previously  Wt Readings from Last 3 Encounters:  11/14/13 168 lb 3.2 oz (76.295 kg)  11/03/13 169 lb 6 oz (76.828 kg)  10/31/13 170 lb (77.111 kg)    Glycemic control:   Lab Results  Component Value Date   HGBA1C 8.7* 11/06/2013   HGBA1C 10.2* 08/26/2013  HGBA1C 9.2* 11/12/2012   Lab Results  Component Value Date   MICROALBUR 8.4* 11/06/2013   LDLCALC 25 03/25/2013   CREATININE 1.5* 11/06/2013        Medication List       This list is accurate as of: 11/14/13  1:03 PM.  Always use your most recent med list.               aspirin 325 MG tablet  Take 325 mg by mouth daily.     atenolol 50 MG tablet  Commonly known as:  TENORMIN  TAKE 1 TABLET BY MOUTH DAILY     betamethasone dipropionate 0.05 % cream  Commonly known as:  DIPROLENE  Apply topically 2 (two) times daily. To the L wrist x 10 days     cholecalciferol 1000 UNITS tablet  Commonly known as:  VITAMIN D  Take 1,000 Units by mouth daily.     clopidogrel 75 MG tablet  Commonly known as:  PLAVIX  TAKE 1 TABLET BY MOUTH DAILY     clorazepate 7.5 MG tablet  Commonly  known as:  TRANXENE  Take 1 tablet (7.5 mg total) by mouth as needed.     dicyclomine 20 MG tablet  Commonly known as:  BENTYL  Take 1 tablet (20 mg total) by mouth 3 (three) times daily as needed (for abdominal cramping).     glucose blood test strip  Commonly known as:  ONE TOUCH ULTRA TEST  USE AS DIRECTED to check blood sugar 3 times per day dx code E11.65     hydrochlorothiazide 25 MG tablet  Commonly known as:  HYDRODIURIL  Take 0.5 tablets (12.5 mg total) by mouth daily.     HYDROcodone-acetaminophen 5-325 MG per tablet  Commonly known as:  NORCO/VICODIN  Take 1 tablet by mouth 3 (three) times daily as needed.     Insulin Glargine 100 UNIT/ML Solostar Pen  Commonly known as:  LANTUS  Inject 50 Units into the skin at bedtime.     Insulin Pen Needle 32G X 6 MM Misc  Commonly known as:  NOVOFINE  Use as directed to check blood sugar     insulin regular 100 units/mL injection  Commonly known as:  HUMULIN R  Inject 0.08 mLs (8 Units total) into the skin 3 (three) times daily before meals.     INSULIN SYRINGE .3CC/31GX5/16" 31G X 5/16" 0.3 ML Misc  Use three needles per day     metFORMIN 500 MG tablet  Commonly known as:  GLUCOPHAGE  Take 500 mg by mouth 2 (two) times daily with a meal.     multivitamin capsule  Take 1 capsule by mouth daily.     neomycin-polymyxin-hydrocortisone otic solution  Commonly known as:  CORTISPORIN  2-3 drops in each ear three times daily     niacin 500 MG CR tablet  Commonly known as:  NIASPAN  TAKE 1 TABLET BY MOUTH EVERY DAY     omeprazole 40 MG capsule  Commonly known as:  PRILOSEC  Take 1 capsule (40 mg total) by mouth daily.     ONE TOUCH ULTRA SYSTEM KIT W/DEVICE Kit  1 kit by Does not apply route once.     ONETOUCH DELICA LANCETS 62I Misc  1 Device by Does not apply route every morning.     ramipril 10 MG capsule  Commonly known as:  ALTACE  TAKE ONE CAPSULE BY MOUTH DAILY     rosuvastatin 10 MG tablet  Commonly  known as:  CRESTOR  Take 1 tablet (10 mg total) by mouth daily.        Allergies:  Allergies  Allergen Reactions  . Codeine     REACTION: makes her nervous  . Ibuprofen     REACTION: nervous  . Meperidine Hcl     REACTION: nasuea and vomitting  . Naproxen Sodium     REACTION: nervous    Past Medical History  Diagnosis Date  . Hypertension   . CAD (coronary artery disease)     Dr Gwenlyn Found  . Venous insufficiency   . Hyperlipidemia   . Diabetes mellitus   . GERD (gastroesophageal reflux disease)   . Gastritis   . Hx of colonic polyps   . Malignant neoplasm of small intestine   . Ventral hernia   . DJD (degenerative joint disease)   . Headache(784.0)   . Depression   . History of shingles   . History of anemia   . Vitamin B12 deficiency     Past Surgical History  Procedure Laterality Date  . Cabg x 5  1997  . Cesarean section    . Left knee surgery    . Tonsillectomy    . Tubal ligation    . 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 March Rummage  . Lap ventral hernia repair with incarcerated colon  09/2008    Dr. Lucia Gaskins    No family history on file.  Social History:  reports that she has never smoked. She has never used smokeless tobacco. She reports that she does not drink alcohol or use illicit drugs.    Review of Systems       Vision is normal. Most recent eye exam was in 2013       Lipids: Has had excellent control of hypercholesterolemia with Crestor which she has taken for several years, on lipid-lowering therapy since her coronary bypass surgery       Lab Results  Component Value Date   CHOL 104 03/25/2013   HDL 40 03/25/2013   LDLCALC 25 03/25/2013   LDLDIRECT 43.1 05/08/2012   TRIG 193* 03/25/2013   CHOLHDL 2.6 03/25/2013                 The blood pressure has been well controlled and she is taking HCTZ, ramipril and atenolol       No history of Numbness, tingling or burning in feet but occasional tingling in her left leg which has had  severe injuries in the past Diabetic foot exam in 9/15 showed:  Decreased monofilament in toes and plantar surfaces, skin exam is normal. Pedal pulses: absent left DP  She has a rash on her wrist and is not taking a steroid cream but this is a small area  Physical Examination:  BP 128/82 mmHg  Pulse 66  Temp(Src) 98.6 F (37 C)  Resp 14  Ht 5' 6" (1.676 m)  Wt 168 lb 3.2 oz (76.295 kg)  BMI 27.16 kg/m2  SpO2 97%  Standing blood pressure 124/80 No pedal edema        ASSESSMENT:  Diabetes type 2, uncontrolled with BMI 27   She has improvement in her blood sugars with taking mealtime insulin along with her Lantus For some reason her blood sugars are significantly better in the morning the last 3 days and less increase after dinner this week However she still tends to have readings over 200 after the evening meal and not clear if  these are improving Most likely she has had improvement with starting metformin which she is tolerating  Renal insufficiency: her creatinine was normal about 2 weeks ago and not clear why it is 1.5, she has not had any nonsteroidal anti-inflammatory drugs or change in medications; However she has been more active outside and does not drink enough liquids  PLAN:   Start increasing the regular insulin after supper if the postprandial readings are still around 180 or more.  She will need to take 2-3 units more when eating out or eating a larger meal  Reduce morning regular insulin by 2 units  Reduce Lantus by 2 units  More blood sugars after breakfast and lunch and check them about 2 hours after eating.  Discussed blood sugar targets  Bring blood sugar meter on each visit  Increase fluid intake especially when working outside  Continue to monitor renal function  Continue taking metformin low dose as creatinine clearance is only modestly affected  Patient Instructions  If sugar after supper stays over 180 go up to 14 regular at supper  Reduce  Regular at breakfast to 10  Increase fluids by 2-3 cups daily  Lantus 48 units  Counseling time over 50% of today's 25 minute visit   Parry Po 11/14/2013, 1:03 PM   Note: This office note was prepared with Estate agent. Any transcriptional errors that result from this process are unintentional. workup

## 2013-11-22 ENCOUNTER — Other Ambulatory Visit: Payer: Self-pay | Admitting: Endocrinology

## 2013-12-29 ENCOUNTER — Ambulatory Visit (INDEPENDENT_AMBULATORY_CARE_PROVIDER_SITE_OTHER): Payer: Medicare Other | Admitting: Endocrinology

## 2013-12-29 ENCOUNTER — Encounter: Payer: Self-pay | Admitting: Endocrinology

## 2013-12-29 VITALS — BP 163/83 | HR 68 | Temp 98.1°F | Resp 14 | Ht 66.0 in | Wt 167.2 lb

## 2013-12-29 DIAGNOSIS — I10 Essential (primary) hypertension: Secondary | ICD-10-CM | POA: Diagnosis not present

## 2013-12-29 DIAGNOSIS — I2510 Atherosclerotic heart disease of native coronary artery without angina pectoris: Secondary | ICD-10-CM

## 2013-12-29 DIAGNOSIS — E1165 Type 2 diabetes mellitus with hyperglycemia: Secondary | ICD-10-CM

## 2013-12-29 DIAGNOSIS — IMO0002 Reserved for concepts with insufficient information to code with codable children: Secondary | ICD-10-CM

## 2013-12-29 DIAGNOSIS — N289 Disorder of kidney and ureter, unspecified: Secondary | ICD-10-CM | POA: Diagnosis not present

## 2013-12-29 LAB — BASIC METABOLIC PANEL
BUN: 26 mg/dL — ABNORMAL HIGH (ref 6–23)
CO2: 26 meq/L (ref 19–32)
CREATININE: 1.2 mg/dL (ref 0.4–1.2)
Calcium: 9.5 mg/dL (ref 8.4–10.5)
Chloride: 106 mEq/L (ref 96–112)
GFR: 44.9 mL/min — ABNORMAL LOW (ref >60.00)
Glucose, Bld: 145 mg/dL — ABNORMAL HIGH (ref 70–99)
Potassium: 4.4 mEq/L (ref 3.5–5.1)
Sodium: 141 mEq/L (ref 135–145)

## 2013-12-29 NOTE — Progress Notes (Signed)
Quick Note:  Please let patient know that the kidney test result is normal and no further action needed ______

## 2013-12-29 NOTE — Patient Instructions (Addendum)
Please check blood sugars at least half the time about 2 hours after any meal and 3-4 times per week on waking up. Please bring blood sugar monitor to each visit  Change lantus to am on waking up, if am sugar stays high in am > 140 go up to 54 units  Regular insulin 14 units with meals, take 30 min before the meal  Start walking daily 10 min or so 1-2x daily

## 2013-12-29 NOTE — Progress Notes (Signed)
Patient ID: [employer], female   DOB: 1940-03-08, 73 y.o.   MRN: [id number]            Reason for Appointment: Followup for Type 2 Diabetes  Referring physician: Larose Kells  History of Present Illness:          Diagnosis: Type 2 diabetes mellitus, date of diagnosis: 1998       Past history:   She was treated with metformin at diagnosis when this had been continued until 2015 Subsequently Amaryl was also added several years ago and this has been continued Her blood sugars were under fair control between 2010 and early 2013 with A1c ranging from 7.4-9.4, mostly under 8% However since 08/2011 her A1c has been mostly over 8%  Insulin was added in 2014 with small doses of Lantus and this has been progressively increased Metformin stopped in 8/15 because of renal dysfunction and her insulin dose was increased from 40 up to 50 units of Lantus once a day She was started on mealtime insulin on her initial consultation in 9/15 because of high postprandial readings, sometimes over 300  Recent history:  Her blood sugars are overall somewhat  Higher than on her last visit.    She says that after her last visit in 11/15 and her blood sugars had improved but there have been much higher recently that she does not know why   Although she thinks she is taking her Regular Insulin with her meals she is probably not taking it at lunchtime as all 3 of her readings in the afternoons are over 200.  Her meals are quite variable and not clear when she is actually eating her second meals.  Also her fasting blood sugars are quite variable and she is not able to explain this.  Usually compliant with   50 units of Lantus at night.  She thinks she is fairly consistent with her mealtime insulin  And is taking this 10 minutes before eating.  She thinks her diet has been generally fairly good and she has not been eating many sweets. She is  Still taking 12 units of regular insulin before her main meals which are a late  breakfast and suppertime  Postprandial readings: she has marked variability in her blood sugars in the evenings.  She thinks the 2 readings she has had over 300 were related to not watching her diet.  Also not clear what her readings are after breakfast, usually eating cereal in the morning She does not think she usually has lunch       Oral hypoglycemic drugs the patient is taking are:  Metformin 500bid    Side effects from medications have been: None INSULIN regimen is described as: 50 units Lantus, Regular 12 units Compliance with the medical regimen: good  Hypoglycemia: none  Glucose monitoring:  done one time a day         Glucometer: One Touch.      Blood Glucose readings by time of day   PRE-MEAL Breakfast  PC Lunch  PC Dinner Bedtime Overall  Glucose range:  124-247  261- 270  145- 322  100-331  100-331  Mean:  153  262   198 191   Self-care: The diet that the patient has been following is: none, usually eating low fat meals Meals: 2-3 meals per day at irregular times. Dinner 7 pm Breakfast is a toast, sausage, usually eating sandwiches for meals, snacks will be with crackers  Exercise: Walking less recently; doing more household activities         [place] visit, most recent: never.  She saw the CDE in 9/15                Weight history: 150-170 previously  Wt Readings from Last 3 Encounters:  12/29/13 167 lb 3.2 oz (75.841 kg)  11/14/13 168 lb 3.2 oz (76.295 kg)  11/03/13 169 lb 6 oz (76.828 kg)    Glycemic control:   Lab Results  Component Value Date   HGBA1C 8.7* 11/06/2013   HGBA1C 10.2* 08/26/2013   HGBA1C 9.2* 11/12/2012   Lab Results  Component Value Date   MICROALBUR 8.4* 11/06/2013   LDLCALC 25 03/25/2013   CREATININE 1.5* 11/06/2013        Medication List       This list is accurate as of: 12/29/13 10:30 AM.  Always use your most recent med list.               aspirin 325 MG tablet  Take 325 mg by mouth daily.     atenolol 50 MG  tablet  Commonly known as:  TENORMIN  TAKE 1 TABLET BY MOUTH DAILY     betamethasone dipropionate 0.05 % cream  Commonly known as:  DIPROLENE  Apply topically 2 (two) times daily. To the L wrist x 10 days     cholecalciferol 1000 UNITS tablet  Commonly known as:  VITAMIN D  Take 1,000 Units by mouth daily.     clopidogrel 75 MG tablet  Commonly known as:  PLAVIX  TAKE 1 TABLET BY MOUTH DAILY     clorazepate 7.5 MG tablet  Commonly known as:  TRANXENE  Take 1 tablet (7.5 mg total) by mouth as needed.     dicyclomine 20 MG tablet  Commonly known as:  BENTYL  Take 1 tablet (20 mg total) by mouth 3 (three) times daily as needed (for abdominal cramping).     glucose blood test strip  Commonly known as:  ONE TOUCH ULTRA TEST  USE AS DIRECTED to check blood sugar 3 times per day dx code E11.65     HUMULIN R 100 units/mL injection  Generic drug:  insulin regular  INJECT 8 UNITS INTO THE SKIN THREE TIMES DAILY BEFORE MEALS     hydrochlorothiazide 25 MG tablet  Commonly known as:  HYDRODIURIL  Take 0.5 tablets (12.5 mg total) by mouth daily.     HYDROcodone-acetaminophen 5-325 MG per tablet  Commonly known as:  NORCO/VICODIN  Take 1 tablet by mouth 3 (three) times daily as needed.     Insulin Glargine 100 UNIT/ML Solostar Pen  Commonly known as:  LANTUS  Inject 50 Units into the skin at bedtime.     Insulin Pen Needle 32G X 6 MM Misc  Commonly known as:  NOVOFINE  Use as directed to check blood sugar     INSULIN SYRINGE .3CC/31GX5/16" 31G X 5/16" 0.3 ML Misc  Use three needles per day     metFORMIN 500 MG tablet  Commonly known as:  GLUCOPHAGE  Take 500 mg by mouth 2 (two) times daily with a meal.     multivitamin capsule  Take 1 capsule by mouth daily.     neomycin-polymyxin-hydrocortisone otic solution  Commonly known as:  CORTISPORIN  2-3 drops in each ear three times daily     niacin 500 MG CR tablet  Commonly known as:  NIASPAN  TAKE 1 TABLET BY MOUTH  EVERY  DAY     omeprazole 40 MG capsule  Commonly known as:  PRILOSEC  Take 1 capsule (40 mg total) by mouth daily.     ONE TOUCH ULTRA SYSTEM KIT W/DEVICE Kit  1 kit by Does not apply route once.     ONETOUCH DELICA LANCETS 30Q Misc  1 Device by Does not apply route every morning.     ramipril 10 MG capsule  Commonly known as:  ALTACE  TAKE ONE CAPSULE BY MOUTH DAILY     rosuvastatin 10 MG tablet  Commonly known as:  CRESTOR  Take 1 tablet (10 mg total) by mouth daily.        Allergies:  Allergies  Allergen Reactions  . Codeine     REACTION: makes her nervous  . Ibuprofen     REACTION: nervous  . Meperidine Hcl     REACTION: nasuea and vomitting  . Naproxen Sodium     REACTION: nervous    Past Medical History  Diagnosis Date  . Hypertension   . CAD (coronary artery disease)     Dr Gwenlyn Found  . Venous insufficiency   . Hyperlipidemia   . Diabetes mellitus   . GERD (gastroesophageal reflux disease)   . Gastritis   . Hx of colonic polyps   . Malignant neoplasm of small intestine   . Ventral hernia   . DJD (degenerative joint disease)   . Headache(784.0)   . Depression   . History of shingles   . History of anemia   . Vitamin B12 deficiency     Past Surgical History  Procedure Laterality Date  . Cabg x 5  1997  . Cesarean section    . Left knee surgery    . Tonsillectomy    . Tubal ligation    . 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 March Rummage  . Lap ventral hernia repair with incarcerated colon  09/2008    Dr. Lucia Gaskins    No family history on file.  Social History:  reports that she has never smoked. She has never used smokeless tobacco. She reports that she does not drink alcohol or use illicit drugs.    Review of Systems         Lipids: Has had excellent control of hypercholesterolemia with Crestor which she has taken for several years, on lipid-lowering therapy since her coronary bypass surgery.  Complaining about the cost       Lab Results    Component Value Date   CHOL 104 03/25/2013   HDL 40 03/25/2013   LDLCALC 25 03/25/2013   LDLDIRECT 43.1 05/08/2012   TRIG 193* 03/25/2013   CHOLHDL 2.6 03/25/2013                 The blood pressure has been previously well controlled and she is taking 12.5 mg HCTZ, ramipril and atenolol  Her blood pressure is unusually high.  She is not checking this at home      No history of Numbness, tingling or burning in feet but occasional tingling in her left leg which has had severe injuries in the past Diabetic foot exam in 9/15 showed:  Decreased monofilament in toes and plantar surfaces, skin exam is normal. Pedal pulses: absent left DP   Physical Examination:  BP 163/83 mmHg  Pulse 68  Temp(Src) 98.1 F (36.7 C)  Resp 14  Ht _0  (1.676 m)  Wt 167 lb 3.2 oz (75.841 kg)  BMI  27.00 kg/m2  SpO2 97%  Repeat blood pressure 160/85 No pedal edema present        ASSESSMENT:  Diabetes type 2, uncontrolled with BMI 27   She has had worsening of her control with her blood sugars averaging nearly 200 now at home. Although she has had occasional inconsistency in her diet she has variable readings and evenings and also not clear of her Lantus is working 24 hours. She may not be covering her lunch consistently and does not give a good history about her day-to-day routine. She has also not been exercising She will probably need larger doses of insulin to cover her meals and to take the insulin 30 minutes before eating She may need to be seen by dietitian again  Renal insufficiency: her creatinine was higher on the last visit and will recheck this today May need nephrology consultation   PLAN:   Start taking Lantus in the mornings and may need a higher dose or to change this to [place]  More blood sugars after breakfast  Go up at least 2 units on the Regular Insulin  Although she is a good candidate for adding Victoza she is concerned about the cost  Start at least a little  walking  She is concerned about her history of stress fractures and may need bone density  Check renal function today  Patient Instructions  Please check blood sugars at least half the time about 2 hours after any meal and 3-4 times per week on waking up. Please bring blood sugar monitor to each visit  Change lantus to am on waking up, if am sugar stays high in am > 140 go up to 54 units  Regular insulin 14 units with meals, take 30 min before the meal  Start walking daily 10 min or so 1-2x daily    Counseling time over 50% of today's 25 minute visit   Hope Holst 12/29/2013, 10:30 AM   Note: This office note was prepared with Estate agent. Any transcriptional errors that result from this process are unintentional.

## 2013-12-30 ENCOUNTER — Other Ambulatory Visit: Payer: Self-pay | Admitting: Internal Medicine

## 2013-12-31 LAB — FRUCTOSAMINE: Fructosamine: 287 umol/L — ABNORMAL HIGH (ref 190–270)

## 2014-01-03 ENCOUNTER — Other Ambulatory Visit: Payer: Self-pay | Admitting: Pulmonary Disease

## 2014-01-28 ENCOUNTER — Other Ambulatory Visit: Payer: Self-pay

## 2014-01-28 ENCOUNTER — Telehealth: Payer: Self-pay | Admitting: Internal Medicine

## 2014-01-28 NOTE — Telephone Encounter (Signed)
Caller name: Asare, Breiana Relation to pt: self  Call back number: [phone] Pharmacy:  Reason for call:  Pt requesting   HYDROcodone-acetaminophen (NORCO/VICODIN) 5-325 MG per tablet    Please leave detail message pt has a doctor appointment at 2:30

## 2014-01-28 NOTE — Telephone Encounter (Signed)
Pt is requesting refills on Hydrocodone. Dr. Larose Kells Pt.  Last OV: 11/03/2013 Last Fill: 11/03/2013 # [phone] UDS: None  Please advise.

## 2014-01-29 MED ORDER — HYDROCODONE-ACETAMINOPHEN 5-325 MG PO TABS: 1.0000 | ORAL_TABLET | Freq: Three times a day (TID) | ORAL | Status: DC | PRN

## 2014-01-29 NOTE — Telephone Encounter (Signed)
Pt of Dr. Larose Kells who needs refill of hydrocodone. Wrote #[phone]. Will sign and pt can pick up.

## 2014-02-05 ENCOUNTER — Other Ambulatory Visit (INDEPENDENT_AMBULATORY_CARE_PROVIDER_SITE_OTHER): Payer: Medicare Other

## 2014-02-05 DIAGNOSIS — IMO0002 Reserved for concepts with insufficient information to code with codable children: Secondary | ICD-10-CM

## 2014-02-05 DIAGNOSIS — E1165 Type 2 diabetes mellitus with hyperglycemia: Secondary | ICD-10-CM

## 2014-02-05 LAB — COMPREHENSIVE METABOLIC PANEL
ALT: 16 U/L (ref 0–35)
AST: 19 U/L (ref 0–37)
Albumin: 3.9 g/dL (ref 3.5–5.2)
Alkaline Phosphatase: 85 U/L (ref 39–117)
BILIRUBIN TOTAL: 0.3 mg/dL (ref 0.2–1.2)
BUN: 20 mg/dL (ref 6–23)
CALCIUM: 9.7 mg/dL (ref 8.4–10.5)
CHLORIDE: 107 meq/L (ref 96–112)
CO2: 28 meq/L (ref 19–32)
CREATININE: 1.22 mg/dL — AB (ref 0.40–1.20)
GFR: 45.74 mL/min — ABNORMAL LOW (ref >60.00)
GLUCOSE: 218 mg/dL — AB (ref 70–99)
Potassium: 4.6 mEq/L (ref 3.5–5.1)
SODIUM: 140 meq/L (ref 135–145)
Total Protein: 6.8 g/dL (ref 6.0–8.3)

## 2014-02-05 LAB — HEMOGLOBIN A1C: Hgb A1c MFr Bld: 8.4 % — ABNORMAL HIGH (ref 4.6–6.5)

## 2014-02-10 ENCOUNTER — Other Ambulatory Visit: Payer: Self-pay | Admitting: *Deleted

## 2014-02-10 ENCOUNTER — Encounter: Payer: Self-pay | Admitting: Endocrinology

## 2014-02-10 ENCOUNTER — Ambulatory Visit (INDEPENDENT_AMBULATORY_CARE_PROVIDER_SITE_OTHER): Payer: Medicare Other | Admitting: Endocrinology

## 2014-02-10 VITALS — BP 170/86 | HR 67 | Temp 98.1°F | Resp 16 | Ht 66.0 in | Wt 172.8 lb

## 2014-02-10 DIAGNOSIS — E1165 Type 2 diabetes mellitus with hyperglycemia: Secondary | ICD-10-CM

## 2014-02-10 DIAGNOSIS — I10 Essential (primary) hypertension: Secondary | ICD-10-CM

## 2014-02-10 DIAGNOSIS — N289 Disorder of kidney and ureter, unspecified: Secondary | ICD-10-CM

## 2014-02-10 DIAGNOSIS — IMO0002 Reserved for concepts with insufficient information to code with codable children: Secondary | ICD-10-CM

## 2014-02-10 MED ORDER — HYDROCHLOROTHIAZIDE 25 MG PO TABS: ORAL_TABLET | ORAL | Status: DC

## 2014-02-10 MED ORDER — GLUCOSE BLOOD VI STRP: ORAL_STRIP | Status: DC

## 2014-02-10 MED ORDER — "INSULIN SYRINGE 31G X 5/16"" 0.3 ML MISC": Status: DC

## 2014-02-10 MED ORDER — LIRAGLUTIDE 18 MG/3ML ~~LOC~~ SOPN: PEN_INJECTOR | SUBCUTANEOUS | Status: DC

## 2014-02-10 NOTE — Progress Notes (Signed)
Patient ID: [employer], female   DOB: 06/11/1940, 74 y.o.   MRN: [id number]            Reason for Appointment: Followup for Type 2 Diabetes  Referring physician: Larose Kells  History of Present Illness:          Diagnosis: Type 2 diabetes mellitus, date of diagnosis: 1998       Past history:   She was treated with metformin at diagnosis when this had been continued until 2015 Subsequently Amaryl was also added several years ago and this has been continued Her blood sugars were under fair control between 2010 and early 2013 with A1c ranging from 7.4-9.4, mostly under 8% However since 08/2011 her A1c has been mostly over 8%  Insulin was added in 2014 with small doses of Lantus and this has been progressively increased Metformin stopped in 8/15 because of renal dysfunction and her insulin dose was increased from 40 up to 50 units of Lantus once a day She was started on mealtime insulin on her initial consultation in 9/15 because of high postprandial readings, sometimes over 300  Recent history:  Her blood sugars are still poorly controlled despite increasing her Regular Insulin and changing her Lantus to the morning. Previously was having high readings later in the day but morning readings are averaging about 150. Metformin dose was limited because of renal dysfunction. Although she was trying to exercise last month she has not done any recently. Also appears to have gained weight. Fasting blood sugars: These are consistently high except for the last 2 days and averaging about 200.  She does not know why some days blood sugars are much higher.  overall somewhat  Higher than on her last visit.     Postprandial readings: she has not checked as many readings after meals and mostly after supper.  These are still fairly consistently high with the lowest reading 160 last night. Blood sugars after her first meal are only mildly increased but occasionally can be as high as 351 This is despite her  trying to take the Regular Insulin about 20-30 minute before meals and increasing the dose to 14 units. She says that at times she has difficulty controlling her appetite but does not eat a lot of snacks. She does not think she usually has lunch and mostly eating a meal at 10 AM and 6 PM  Although her A1c is slightly better her average blood sugar recently is higher than before       Oral hypoglycemic drugs the patient is taking are:  Metformin 500bid    Side effects from medications have been: None INSULIN regimen is described as: 50 units Lantus, Regular 12 units Compliance with the medical regimen: good  Hypoglycemia: none  Glucose monitoring:  done one time a day         Glucometer: One Touch.      Blood Glucose readings by time of day   PRE-MEAL Breakfast  1-3 PM  Dinner  PCS  Overall  Glucose range:  122-275   181-374   175, 246   149-293    Median:      203    Self-care: The diet that the patient has been following is: none, usually eating low fat meals Meals: 2-3 meals per day at irregular times. Dinner 6-7 pm Breakfast is a toast, sausage, usually eating sandwiches for meals, snacks will be with crackers         Exercise: Walking at mall until  recently Dietician visit, most recent: never.  She saw the CDE in 9/15                Weight history: 150-170 previously  Wt Readings from Last 3 Encounters:  02/10/14 172 lb 12.8 oz (78.382 kg)  12/29/13 167 lb 3.2 oz (75.841 kg)  11/14/13 168 lb 3.2 oz (76.295 kg)    Glycemic control:   Lab Results  Component Value Date   HGBA1C 8.4* 02/05/2014   HGBA1C 8.7* 11/06/2013   HGBA1C 10.2* 08/26/2013   Lab Results  Component Value Date   MICROALBUR 8.4* 11/06/2013   LDLCALC 25 03/25/2013   CREATININE 1.22* 02/05/2014        Medication List       This list is accurate as of: 02/10/14  5:35 PM.  Always use your most recent med list.               aspirin 325 MG tablet  Take 325 mg by mouth daily.     atenolol 50  MG tablet  Commonly known as:  TENORMIN  TAKE 1 TABLET BY MOUTH DAILY     betamethasone dipropionate 0.05 % cream  Commonly known as:  DIPROLENE  Apply topically 2 (two) times daily. To the L wrist x 10 days     cholecalciferol 1000 UNITS tablet  Commonly known as:  VITAMIN D  Take 1,000 Units by mouth daily.     clopidogrel 75 MG tablet  Commonly known as:  PLAVIX  TAKE 1 TABLET BY MOUTH DAILY     clorazepate 7.5 MG tablet  Commonly known as:  TRANXENE  Take 1 tablet (7.5 mg total) by mouth as needed.     dicyclomine 20 MG tablet  Commonly known as:  BENTYL  Take 1 tablet (20 mg total) by mouth 3 (three) times daily as needed (for abdominal cramping).     glucose blood test strip  Commonly known as:  ONE TOUCH ULTRA TEST  USE AS DIRECTED to check blood sugar 3 times per day dx code E11.65     HUMULIN R 100 units/mL injection  Generic drug:  insulin regular  INJECT 8 UNITS INTO THE SKIN THREE TIMES DAILY BEFORE MEALS     hydrochlorothiazide 25 MG tablet  Commonly known as:  HYDRODIURIL  Take 1 tablet daily     HYDROcodone-acetaminophen 5-325 MG per tablet  Commonly known as:  NORCO/VICODIN  Take 1 tablet by mouth 3 (three) times daily as needed.     Insulin Glargine 100 UNIT/ML Solostar Pen  Commonly known as:  LANTUS  Inject 50 Units into the skin at bedtime.     Insulin Pen Needle 32G X 6 MM Misc  Commonly known as:  NOVOFINE  Use as directed to check blood sugar     NOVOFINE 32G X 6 MM Misc  Generic drug:  Insulin Pen Needle  USE AS DIRECTED TO CHECK BLOOD SUGAR     INSULIN SYRINGE .3CC/31GX5/16" 31G X 5/16" 0.3 ML Misc  Use three needles per day     Liraglutide 18 MG/3ML Sopn  Commonly known as:  VICTOZA  Inject 1.2 mg daily     metFORMIN 500 MG tablet  Commonly known as:  GLUCOPHAGE  Take 500 mg by mouth 2 (two) times daily with a meal.     multivitamin capsule  Take 1 capsule by mouth daily.     neomycin-polymyxin-hydrocortisone otic solution    Commonly known as:  CORTISPORIN  2-3 drops  in each ear three times daily     niacin 500 MG CR tablet  Commonly known as:  NIASPAN  TAKE 1 TABLET BY MOUTH DAILY     omeprazole 40 MG capsule  Commonly known as:  PRILOSEC  Take 1 capsule (40 mg total) by mouth daily.     ONE TOUCH ULTRA SYSTEM KIT W/DEVICE Kit  1 kit by Does not apply route once.     ONETOUCH DELICA LANCETS 84X Misc  1 Device by Does not apply route every morning.     ramipril 10 MG capsule  Commonly known as:  ALTACE  TAKE ONE CAPSULE BY MOUTH DAILY     rosuvastatin 10 MG tablet  Commonly known as:  CRESTOR  Take 1 tablet (10 mg total) by mouth daily.        Allergies:  Allergies  Allergen Reactions  . Codeine     REACTION: makes her nervous  . Ibuprofen     REACTION: nervous  . Meperidine Hcl     REACTION: nasuea and vomitting  . Naproxen Sodium     REACTION: nervous    Past Medical History  Diagnosis Date  . Hypertension   . CAD (coronary artery disease)     Dr Gwenlyn Found  . Venous insufficiency   . Hyperlipidemia   . Diabetes mellitus   . GERD (gastroesophageal reflux disease)   . Gastritis   . Hx of colonic polyps   . Malignant neoplasm of small intestine   . Ventral hernia   . DJD (degenerative joint disease)   . Headache(784.0)   . Depression   . History of shingles   . History of anemia   . Vitamin B12 deficiency     Past Surgical History  Procedure Laterality Date  . Cabg x 5  1997  . Cesarean section    . Left knee surgery    . Tonsillectomy    . Tubal ligation    . 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 March Rummage  . Lap ventral hernia repair with incarcerated colon  09/2008    Dr. Lucia Gaskins    No family history on file.  Social History:  reports that she has never smoked. She has never used smokeless tobacco. She reports that she does not drink alcohol or use illicit drugs.    Review of Systems         Lipids: Has had excellent control of hypercholesterolemia  with Crestor which she has taken for several years, on lipid-lowering therapy since her coronary bypass surgery.         Lab Results  Component Value Date   CHOL 104 03/25/2013   HDL 40 03/25/2013   LDLCALC 25 03/25/2013   LDLDIRECT 43.1 05/08/2012   TRIG 193* 03/25/2013   CHOLHDL 2.6 03/25/2013                 The blood pressure has been previously well controlled and she is taking 12.5 mg HCTZ, ramipril and atenolol  Her blood pressure is again significantly high and she has not seen her PCP recently for this.  She is not checking this at home      No history of Numbness, tingling or burning in feet but occasional tingling in her left leg which has had severe injuries in the past Diabetic foot exam in 9/15 showed:  Decreased monofilament in toes and plantar surfaces, skin exam is normal. Pedal pulses: absent left DP   Physical Examination:  BP 170/86 mmHg  Pulse 67  Temp(Src) 98.1 F (36.7 C)  Resp 16  Ht 5' 6" (1.676 m)  Wt 172 lb 12.8 oz (78.382 kg)  BMI 27.90 kg/m2  SpO2 95%  Repeat blood pressure 170/86 No pedal edema present        ASSESSMENT:  Diabetes type 2, uncontrolled with BMI 27   She has had worsening of her control with her blood sugars averaging over 200 now at home. Since her fasting blood sugars are high with taking Lantus in the morning it is likely not working 24 hours. Also appears to be somewhat insulin resistant with taking relatively large amounts of insulin Postprandial readings are not much higher than her fasting readings and are showing a median of about 220 with some significant variability.  She is a good candidate for adding a GLP-1 drug to help her control and unable weight loss. Discussed with the patient the nature of GLP-1 drugs, the actions on various organ systems, how they benefit blood glucose control, as well as the benefit of weight loss and  increase satiety . Explained possible side effects especially nausea and vomiting  initially; discussed safety information in package insert.  Described the injection technique and dosage titration of Victoza  starting with 0.6 mg once a day at the same time for the first week and then increasing to 1.2 mg if no symptoms of nausea.  Educational brochure on Victoza and co-pay card given  She may need to be seen by dietitian again, otherwise will have her see the nurse educator to help insulin adjustment and revealed a today management  HYPERTENSION: This appears to be poorly controlled for a second time and will increase her HCTZ for now.  She is reluctant to add another medication  Renal insufficiency: her creatinine is relatively better     PLAN:   Start taking Lantus 54 units for now and may need to adjust this based on fasting blood sugar trend  May need to change this to [facility].  Start Victoza as above, discussed possibly needing to reduce insulin once this is more effective.  More blood sugars after meals in general  Although she is a good candidate for adding Victoza she is concerned about the cost  Start walking at the mall again  Increase HCTZ to 25 mg and follow-up with PCP  Patient Instructions  Start VICTOZA injection as shown once daily at the same time of the day.  Dial the dose to 0.6 mg on the pen for the first week.   You may inject in the stomach, thigh or arm. You may experience nausea in the first few days which usually goes away.   You will feel fullness of the stomach with starting the medication and should try to keep the portions at meals small. After 1 week increase the dose to 1.68m daily if no nausea present.   If any questions or concerns are present call the office or the [facility] at [phone] Visit [email or web address].[email or web address] more useful information  Increase Lantus 54 units, if sugars < 120 in am reduce to 50 units  May need to reduce Regular to 10-12 units if sugars stay <150 after meals  Take  full tab of HCTZ     Counseling time over 50% of today's 25 minute visit   , 02/10/2014, 5:35 PM   Note: This office note was prepared with Dragon voice recognition system technology. Any transcriptional errors that result from this process are  unintentional.

## 2014-02-10 NOTE — Patient Instructions (Addendum)
Start VICTOZA injection as shown once daily at the same time of the day.  Dial the dose to 0.6 mg on the pen for the first week.   You may inject in the stomach, thigh or arm. You may experience nausea in the first few days which usually goes away.   You will feel fullness of the stomach with starting the medication and should try to keep the portions at meals small. After 1 week increase the dose to 1.2mg  daily if no nausea present.   If any questions or concerns are present call the office or the [place] helpline at [phone]. Visit [email or web address].info/ for more useful information  Increase Lantus 54 units, if sugars < 120 in am reduce to 50 units  May need to reduce Regular to 10-12 units if sugars stay <150 after meals  Take full tab of HCTZ

## 2014-02-20 ENCOUNTER — Other Ambulatory Visit: Payer: Self-pay | Admitting: Pulmonary Disease

## 2014-02-24 ENCOUNTER — Encounter: Payer: Medicare Other | Attending: Endocrinology | Admitting: Nutrition

## 2014-02-24 ENCOUNTER — Other Ambulatory Visit: Payer: Self-pay | Admitting: *Deleted

## 2014-02-24 DIAGNOSIS — Z794 Long term (current) use of insulin: Secondary | ICD-10-CM | POA: Diagnosis not present

## 2014-02-24 DIAGNOSIS — E1110 Type 2 diabetes mellitus with ketoacidosis without coma: Secondary | ICD-10-CM

## 2014-02-24 DIAGNOSIS — Z713 Dietary counseling and surveillance: Secondary | ICD-10-CM | POA: Insufficient documentation

## 2014-02-24 DIAGNOSIS — E1310 Other specified diabetes mellitus with ketoacidosis without coma: Secondary | ICD-10-CM | POA: Diagnosis not present

## 2014-02-24 DIAGNOSIS — C9210 Chronic myeloid leukemia, BCR/ABL-positive, not having achieved remission: Secondary | ICD-10-CM

## 2014-02-24 NOTE — Patient Instructions (Signed)
1. Have a small breakfast of 1/2 to one serving of carbohydrate, and one ounce of protein.      2.  Start back walking for 20-40 min. 4x/wk.  3.  Reduce the R insulin at supper from 14u to 12u.   4.  Limit high fat foods/meals to once a week. 5.  If exercising after a meal, reduce the R insulin by 1-2u.

## 2014-02-24 NOTE — Progress Notes (Signed)
She did not bring her meter.  She says that her blood sugar was 67 last night at 9:30PM (3hr. PcS), and she ate 2 scoops of ice cream, and it was 134 this AM.  Says this is the 2nd time she was low at this time of night She reports going up on the dose of Victoza last night to 1.2, and that she is eating less. " Could only eat 1/2 of her supper at [employer] of spagetti with broccoli and cheese.  Feels like she is eating less now, but continues to take 14u of R insulin before her two meals.  Typical day: 10-11 Up.  Coffee with spenda and cream 12-1PM: first meal:  Sandwich or ham biscuit, or whatever is in the fridge from supper the night before.  Usually some protein with veg. And a starch.  Diet drink 4PM: occasionally has a afternoon snack of 4 crackers with peanut butter.   7-9PM: supper usually eaten out of meat, or seafood with bake potato and diet drink 11-12MN sometimes a late night snack (2-3 times/wk. Of single serving popcorn. 2AM:  Goes to sleep. Denies snacking between meals, or deserts.   Exercise: none.  Was walking, but non in 3-4 months.  Discussed the importance of exercise, and the need to reduce the R insulin before the meal, which she will be exercising afterwards.  She reported good understanding of this.   Insulin dose: Lantus: 54u q AM, Humulin R 14u acl and acS. Victoza 1.2 at HS.  Suggestions given: 1. Have a little breakfast.  Gave suggestions for 10-15 grams of carb and one ounce of protein.      2.  Start back walking for 20-40 min. 4x/wk.  3.  Reduce the R insulin at supper from 14u to 12u.   4.  Limit high fat foods/meals to once a week.  She was given a list of carbs, proteins and fat to choose, for meals, and was told to have 1 carb serving at breakfast, 2 at lunch and 2-3 at supper.  From the protein list, she will take 1 at breakfast, and 2-3 at lunch and 3 at supper.  She was encouraged to limit the fat at each meal to the least amount possible.  We reviewed what  fats were, and she reported good understanding of this.  She had no final questions.

## 2014-02-25 ENCOUNTER — Other Ambulatory Visit (HOSPITAL_BASED_OUTPATIENT_CLINIC_OR_DEPARTMENT_OTHER): Payer: Medicare Other | Admitting: Lab

## 2014-02-25 ENCOUNTER — Ambulatory Visit (HOSPITAL_BASED_OUTPATIENT_CLINIC_OR_DEPARTMENT_OTHER): Payer: Medicare Other | Admitting: Hematology & Oncology

## 2014-02-25 ENCOUNTER — Encounter: Payer: Self-pay | Admitting: Hematology & Oncology

## 2014-02-25 ENCOUNTER — Other Ambulatory Visit (HOSPITAL_COMMUNITY)
Admission: RE | Admit: 2014-02-25 | Discharge: 2014-02-25 | Disposition: A | Discharge status: Home / Self Care | Payer: Medicare Other | Source: Ambulatory Visit | Attending: Hematology & Oncology | Admitting: Hematology & Oncology

## 2014-02-25 VITALS — BP 167/81 | HR 78 | Temp 98.0°F | Resp 14 | Ht 66.0 in | Wt 169.0 lb

## 2014-02-25 DIAGNOSIS — D729 Disorder of white blood cells, unspecified: Secondary | ICD-10-CM | POA: Diagnosis not present

## 2014-02-25 DIAGNOSIS — C9290 Myeloid leukemia, unspecified, not having achieved remission: Secondary | ICD-10-CM | POA: Diagnosis not present

## 2014-02-25 DIAGNOSIS — C9210 Chronic myeloid leukemia, BCR/ABL-positive, not having achieved remission: Secondary | ICD-10-CM

## 2014-02-25 DIAGNOSIS — C9110 Chronic lymphocytic leukemia of B-cell type not having achieved remission: Secondary | ICD-10-CM | POA: Insufficient documentation

## 2014-02-25 DIAGNOSIS — C8590 Non-Hodgkin lymphoma, unspecified, unspecified site: Secondary | ICD-10-CM | POA: Insufficient documentation

## 2014-02-25 HISTORY — DX: Chronic lymphocytic leukemia of B-cell type not having achieved remission: C91.10

## 2014-02-25 LAB — TECHNOLOGIST REVIEW CHCC SATELLITE

## 2014-02-25 LAB — CBC WITH DIFFERENTIAL (CANCER CENTER ONLY)
BASO#: 0 10*3/uL (ref 0.0–0.2)
BASO%: 0.2 % (ref 0.0–2.0)
EOS%: 1.3 % (ref 0.0–7.0)
Eosinophils Absolute: 0.2 10*3/uL (ref 0.0–0.5)
HCT: 38.5 % (ref 34.8–46.6)
HGB: 12.8 g/dL (ref 11.6–15.9)
LYMPH#: 10.5 10*3/uL — AB (ref 0.9–3.3)
LYMPH%: 64 % — ABNORMAL HIGH (ref 14.0–48.0)
MCH: 30.9 pg (ref 26.0–34.0)
MCHC: 33.2 g/dL (ref 32.0–36.0)
MCV: 93 fL (ref 81–101)
MONO#: 1.2 10*3/uL — AB (ref 0.1–0.9)
MONO%: 7.3 % (ref 0.0–13.0)
NEUT#: 4.5 10*3/uL (ref 1.5–6.5)
NEUT%: 27.2 % — ABNORMAL LOW (ref 39.6–80.0)
PLATELETS: 200 10*3/uL (ref 145–400)
RBC: 4.14 10*6/uL (ref 3.70–5.32)
RDW: 13 % (ref 11.1–15.7)
WBC: 16.5 10*3/uL — AB (ref 3.9–10.0)

## 2014-02-25 LAB — CHCC SATELLITE - SMEAR

## 2014-02-25 NOTE — Progress Notes (Signed)
Hematology and Oncology Follow Up Visit  [name] [id number] [birth year] 74 y.o. 02/25/2014   Principle Diagnosis:   CLL -stage A  Remote history of colon cancer  Current Therapy:    Observation     Interim History:  [name] is back for follow-up. This is her second office visit. We first saw her back in October 2015. She has CLL. She's been totally asymptomatic with this. If she has have diabetes. She is on insulin for this.  She has had no problems with fever. There has been no problems with swollen lymph nodes. She has not had abdominal pain. She is at had not had any change in bowel or bladder habits.  There's been no rashes. She's had no leg swelling.  There's been no neuropathy issues.  Medications:  Current outpatient prescriptions:  .  aspirin 325 MG tablet, Take 325 mg by mouth daily., Disp: , Rfl:  .  atenolol (TENORMIN) 50 MG tablet, TAKE 1 TABLET BY MOUTH DAILY, Disp: 90 tablet, Rfl: 1 .  Blood Glucose Monitoring Suppl (ONE TOUCH ULTRA SYSTEM KIT) W/DEVICE KIT, 1 kit by Does not apply route once., Disp: 1 each, Rfl: 0 .  cholecalciferol (VITAMIN D) 1000 UNITS tablet, Take 1,000 Units by mouth daily.  , Disp: , Rfl:  .  clopidogrel (PLAVIX) 75 MG tablet, TAKE 1 TABLET BY MOUTH DAILY, Disp: 90 tablet, Rfl: 1 .  dicyclomine (BENTYL) 20 MG tablet, Take 1 tablet (20 mg total) by mouth 3 (three) times daily as needed (for abdominal cramping)., Disp: 50 tablet, Rfl: 5 .  docusate sodium (COLACE) 100 MG capsule, Take 100 mg by mouth 2 (two) times daily., Disp: , Rfl:  .  glucose blood (ONE TOUCH ULTRA TEST) test strip, USE AS DIRECTED to check blood sugar 3 times per day dx code E11.65, Disp: 100 each, Rfl: 3 .  HUMULIN R 100 UNIT/ML injection, INJECT 8 UNITS INTO THE SKIN THREE TIMES DAILY BEFORE MEALS (Patient taking differently: INJECT 12 UNITS INTO THE SKIN THREE TIMES DAILY BEFORE MEALS), Disp: 10 mL, Rfl: 3 .  hydrochlorothiazide (HYDRODIURIL) 25 MG tablet,  Take 1 tablet daily, Disp: 90 tablet, Rfl: 1 .  HYDROcodone-acetaminophen (NORCO/VICODIN) 5-325 MG per tablet, Take 1 tablet by mouth 3 (three) times daily as needed., Disp: 45 tablet, Rfl: 0 .  Insulin Glargine (LANTUS) 100 UNIT/ML Solostar Pen, Inject 50 Units into the skin at bedtime., Disp: 15 mL, Rfl: 3 .  Insulin Syringe-Needle U-100 (INSULIN SYRINGE .3CC/31GX5/16") 31G X 5/16" 0.3 ML MISC, Use three needles per day, Disp: 100 each, Rfl: 3 .  Liraglutide (VICTOZA) 18 MG/3ML SOPN, Inject 1.2 mg daily, Disp: 6 mL, Rfl: 3 .  metFORMIN (GLUCOPHAGE) 500 MG tablet, Take 500 mg by mouth 2 (two) times daily with a meal., Disp: , Rfl:  .  Multiple Vitamin (MULTIVITAMIN) capsule, Take 1 capsule by mouth daily.  , Disp: , Rfl:  .  niacin (NIASPAN) 500 MG CR tablet, TAKE 1 TABLET BY MOUTH DAILY, Disp: 90 tablet, Rfl: 2 .  omeprazole (PRILOSEC) 40 MG capsule, Take 1 capsule (40 mg total) by mouth daily., Disp: 90 capsule, Rfl: 1 .  ramipril (ALTACE) 10 MG capsule, TAKE ONE CAPSULE BY MOUTH DAILY, Disp: 90 capsule, Rfl: 1 .  rosuvastatin (CRESTOR) 10 MG tablet, Take 1 tablet (10 mg total) by mouth daily., Disp: 90 tablet, Rfl: 1 .  betamethasone dipropionate (DIPROLENE) 0.05 % cream, Apply topically 2 (two) times daily. To the L wrist x 10  days (Patient not taking: Reported on 02/25/2014), Disp: 30 g, Rfl: 0 .  clorazepate (TRANXENE) 7.5 MG tablet, Take 1 tablet (7.5 mg total) by mouth as needed. (Patient not taking: Reported on 02/25/2014), Disp: 30 tablet, Rfl: 5 .  neomycin-polymyxin-hydrocortisone (CORTISPORIN) otic solution, 2-3 drops in each ear three times daily (Patient not taking: Reported on 02/25/2014), Disp: 10 mL, Rfl: 2  Allergies:  Allergies  Allergen Reactions  . Codeine     REACTION: makes her nervous  . Ibuprofen     REACTION: nervous  . Meperidine Hcl     REACTION: nasuea and vomitting  . Naproxen Sodium     REACTION: nervous    Past Medical History, Surgical history, Social  history, and Family History were reviewed and updated.  Review of Systems: As above  Physical Exam:  height is _0  (1.676 m) and weight is 169 lb (76.658 kg). Her oral temperature is 98 F (36.7 C). Her blood pressure is 167/81 and her pulse is 78. Her respiration is 14.   Well-developed well-nourished white female. Head and neck exam shows no ocular or oral lesions. She has no palpable cervical or supraclavicular lymph nodes. Lungs are clear. Cardiac exam regular rate and rhythm with no murmurs, rubs or bruits. Abdomen is soft. She has good bowel sounds. There is no fluid wave. There is no palpable liver or spleen tip. She has well-healed laparotomy scar. Extremities shows no clubbing, cyanosis or edema. Skin exam no rashes, ecchymoses or petechia. Neurological exam is nonfocal.  Lab Results  Component Value Date   WBC 16.5* 02/25/2014   HGB 12.8 02/25/2014   HCT 38.5 02/25/2014   MCV 93 02/25/2014   PLT 200 02/25/2014     Chemistry      Component Value Date/Time   NA 140 02/05/2014 0852   NA 141 10/31/2013 1041   K 4.6 02/05/2014 0852   K 4.1 10/31/2013 1041   CL 107 02/05/2014 0852   CL 101 10/31/2013 1041   CO2 28 02/05/2014 0852   CO2 26 10/31/2013 1041   BUN 20 02/05/2014 0852   BUN 20 10/31/2013 1041   CREATININE 1.22* 02/05/2014 0852   CREATININE 1.1 10/31/2013 1041      Component Value Date/Time   CALCIUM 9.7 02/05/2014 0852   CALCIUM 9.9 10/31/2013 1041   ALKPHOS 85 02/05/2014 0852   ALKPHOS 73 10/31/2013 1041   AST 19 02/05/2014 0852   AST 27 10/31/2013 1041   ALT 16 02/05/2014 0852   ALT 27 10/31/2013 1041   BILITOT 0.3 02/05/2014 0852   BILITOT 0.60 10/31/2013 1041         Impression and Plan: [name] is 74 year old white female with CLL. She has stage A disease. She is asymptomatic. There is no indication that we have to treat her. She is not anemic or thrombocytopenic.  I think we probably get her back in 6 months. I don't see that would  need any blood work in between visits.   [name], MD 2/24/20162:10 PM

## 2014-02-27 LAB — COMPREHENSIVE METABOLIC PANEL
ALT: 17 U/L (ref 0–35)
AST: 19 U/L (ref 0–37)
Albumin: 4.2 g/dL (ref 3.5–5.2)
Alkaline Phosphatase: 87 U/L (ref 39–117)
BILIRUBIN TOTAL: 0.4 mg/dL (ref 0.2–1.2)
BUN: 28 mg/dL — AB (ref 6–23)
CALCIUM: 9.9 mg/dL (ref 8.4–10.5)
CHLORIDE: 103 meq/L (ref 96–112)
CO2: 27 mEq/L (ref 19–32)
Creatinine, Ser: 1.33 mg/dL — ABNORMAL HIGH (ref 0.50–1.10)
Glucose, Bld: 142 mg/dL — ABNORMAL HIGH (ref 70–99)
POTASSIUM: 4.2 meq/L (ref 3.5–5.3)
Sodium: 140 mEq/L (ref 135–145)
Total Protein: 7.2 g/dL (ref 6.0–8.3)

## 2014-02-27 LAB — IGG, IGA, IGM
IGG (IMMUNOGLOBIN G), SERUM: 1030 mg/dL (ref 690–1700)
IgA: 281 mg/dL (ref 69–380)
IgM, Serum: 41 mg/dL — ABNORMAL LOW (ref 52–322)

## 2014-02-27 LAB — PROTEIN ELECTROPHORESIS, SERUM
ALPHA-2-GLOBULIN: 13.7 % — AB (ref 7.1–11.8)
Albumin ELP: 54.3 % — ABNORMAL LOW (ref 55.8–66.1)
Alpha-1-Globulin: 4.4 % (ref 2.9–4.9)
BETA 2: 6.2 % (ref 3.2–6.5)
Beta Globulin: 7 % (ref 4.7–7.2)
GAMMA GLOBULIN: 14.4 % (ref 11.1–18.8)
Total Protein, Serum Electrophoresis: 7.2 g/dL (ref 6.0–8.3)

## 2014-02-27 LAB — HAPTOGLOBIN: Haptoglobin: 241 mg/dL — ABNORMAL HIGH (ref 43–212)

## 2014-02-27 LAB — LACTATE DEHYDROGENASE: LDH: 156 U/L (ref 94–250)

## 2014-03-02 ENCOUNTER — Other Ambulatory Visit: Payer: Self-pay

## 2014-03-02 MED ORDER — INSULIN PEN NEEDLE 32G X 4 MM MISC: Status: DC

## 2014-03-02 NOTE — Telephone Encounter (Signed)
Rx request for Novofine 32G pen needles 100'2- use as directed to check blood sugar #100  Pharm:  [facility]   Rx sent to pharmacy.

## 2014-03-03 ENCOUNTER — Encounter: Payer: Self-pay | Admitting: Endocrinology

## 2014-03-03 ENCOUNTER — Ambulatory Visit (INDEPENDENT_AMBULATORY_CARE_PROVIDER_SITE_OTHER): Payer: Medicare Other | Admitting: Endocrinology

## 2014-03-03 ENCOUNTER — Other Ambulatory Visit: Payer: Self-pay | Admitting: *Deleted

## 2014-03-03 VITALS — BP 131/86 | HR 80 | Temp 98.0°F | Resp 14 | Ht 66.0 in | Wt 167.6 lb

## 2014-03-03 DIAGNOSIS — E1165 Type 2 diabetes mellitus with hyperglycemia: Secondary | ICD-10-CM

## 2014-03-03 DIAGNOSIS — K529 Noninfective gastroenteritis and colitis, unspecified: Secondary | ICD-10-CM | POA: Diagnosis not present

## 2014-03-03 DIAGNOSIS — I10 Essential (primary) hypertension: Secondary | ICD-10-CM | POA: Diagnosis not present

## 2014-03-03 DIAGNOSIS — IMO0002 Reserved for concepts with insufficient information to code with codable children: Secondary | ICD-10-CM

## 2014-03-03 MED ORDER — PROMETHAZINE HCL 12.5 MG PO TABS: ORAL_TABLET | ORAL | Status: DC

## 2014-03-03 NOTE — Progress Notes (Signed)
Patient ID: [name], female   DOB: 1940-10-06, 74 y.o.   MRN: [id number]            Reason for Appointment: Followup for Type 2 Diabetes  Referring physician: Larose Kells  History of Present Illness:          Diagnosis: Type 2 diabetes mellitus, date of diagnosis: 1998       Past history:   She was treated with metformin at diagnosis when this had been continued until 2015 Subsequently Amaryl was also added several years ago and this has been continued Her blood sugars were under fair control between 2010 and early 2013 with A1c ranging from 7.4-9.4, mostly under 8% However since 08/2011 her A1c has been mostly over 8%  Insulin was added in 2014 with small doses of Lantus and this has been progressively increased Metformin stopped in 8/15 because of renal dysfunction and her insulin dose was increased from 40 up to 50 units of Lantus once a day She was started on mealtime insulin on her initial consultation in 9/15 because of high postprandial readings, sometimes over 300  Recent history:  Her blood sugars were  poorly controlled on her last visit despite increasing her Regular Insulin and changing her Lantus to the morning. Because of this she was given a trial of Victoza 1.2 mg daily which she started in early 02/2014 She did have mild nausea and initially but this was better in a few days and she was able to go up to 1.2 mg without any further nausea.  She has taken this dose for at least a week now Also her blood sugars appear to be improving overall and fluctuating much less She is able to reduce her portion somewhat Has been on 4 units more of the Lantus as directed and taking this in the morning and Victoza at night  Current blood sugar patterns: Fasting blood sugars: These are not as high and only mild increased, previously were averaging around 200 She has relatively high readings at times in the morning compared to the night before   Postprandial readings: she still has  not checked as many readings after meals. Has a couple of mildly increased readings before supper at about 5-6 PM Has variable readings after supper but  lower on an average compared to the last visit She does not check any readings after breakfast even though she has a relatively high carbohydrate meal in the morning. She does not think she usually has lunch and mostly eating a meal at 10 AM and 6 PM      Oral hypoglycemic drugs the patient is taking are:  Metformin 500bid    Side effects from medications have been: None INSULIN regimen is described as: 54 units Lantus in a.m., Regular 12 units before meals Compliance with the medical regimen: good  Hypoglycemia: Minimal, had one low sugar of 67 after supper  Glucose monitoring:  done one time a day         Glucometer: One Touch.      Blood Glucose readings by time of day   PRE-MEAL Breakfast Lunch Dinner  PCS  Overall  Glucose range:  107-186  ?    186, 199   67-226    Mean  160     142  153    Self-care: The diet that the patient has been following is: none, usually eating low fat meals Meals: 2-3 meals per day at irregular times. Dinner 6-7 pm Breakfast is a  toast, sausage but sometimes oatmeal, usually eating sandwiches for meals, snacks will be with crackers         Exercise: Walking at mall at times Dietician visit, most recent: never.  She saw the CDE in 9/15                Weight history: 150-170 previously  167.6  Wt Readings from Last 3 Encounters:  03/03/14 167 lb 9.6 oz (76.023 kg)  02/25/14 169 lb (76.658 kg)  02/10/14 172 lb 12.8 oz (78.382 kg)    Glycemic control:   Lab Results  Component Value Date   HGBA1C 8.4* 02/05/2014   HGBA1C 8.7* 11/06/2013   HGBA1C 10.2* 08/26/2013   Lab Results  Component Value Date   MICROALBUR 8.4* 11/06/2013   LDLCALC 25 03/25/2013   CREATININE 1.33* 02/25/2014        Medication List       This list is accurate as of: 03/03/14  3:32 PM.  Always use your most recent med  list.               aspirin 325 MG tablet  Take 325 mg by mouth daily.     atenolol 50 MG tablet  Commonly known as:  TENORMIN  TAKE 1 TABLET BY MOUTH DAILY     betamethasone dipropionate 0.05 % cream  Commonly known as:  DIPROLENE  Apply topically 2 (two) times daily. To the L wrist x 10 days     cholecalciferol 1000 UNITS tablet  Commonly known as:  VITAMIN D  Take 1,000 Units by mouth daily.     clopidogrel 75 MG tablet  Commonly known as:  PLAVIX  TAKE 1 TABLET BY MOUTH DAILY     clorazepate 7.5 MG tablet  Commonly known as:  TRANXENE  Take 1 tablet (7.5 mg total) by mouth as needed.     dicyclomine 20 MG tablet  Commonly known as:  BENTYL  Take 1 tablet (20 mg total) by mouth 3 (three) times daily as needed (for abdominal cramping).     docusate sodium 100 MG capsule  Commonly known as:  COLACE  Take 100 mg by mouth 2 (two) times daily.     glucose blood test strip  Commonly known as:  ONE TOUCH ULTRA TEST  USE AS DIRECTED to check blood sugar 3 times per day dx code E11.65     HUMULIN R 100 units/mL injection  Generic drug:  insulin regular  INJECT 8 UNITS INTO THE SKIN THREE TIMES DAILY BEFORE MEALS     hydrochlorothiazide 25 MG tablet  Commonly known as:  HYDRODIURIL  Take 1 tablet daily     HYDROcodone-acetaminophen 5-325 MG per tablet  Commonly known as:  NORCO/VICODIN  Take 1 tablet by mouth 3 (three) times daily as needed.     Insulin Glargine 100 UNIT/ML Solostar Pen  Commonly known as:  LANTUS  Inject 50 Units into the skin at bedtime.     Insulin Pen Needle 32G X 4 MM Misc  Commonly known as:  BD PEN NEEDLE NANO U/F  Use 3 needles per day.     INSULIN SYRINGE .3CC/31GX5/16" 31G X 5/16" 0.3 ML Misc  Use three needles per day     Liraglutide 18 MG/3ML Sopn  Commonly known as:  VICTOZA  Inject 1.2 mg daily     metFORMIN 500 MG tablet  Commonly known as:  GLUCOPHAGE  Take 500 mg by mouth 2 (two) times daily with a meal.  multivitamin capsule  Take 1 capsule by mouth daily.     neomycin-polymyxin-hydrocortisone otic solution  Commonly known as:  CORTISPORIN  2-3 drops in each ear three times daily     niacin 500 MG CR tablet  Commonly known as:  NIASPAN  TAKE 1 TABLET BY MOUTH DAILY     omeprazole 40 MG capsule  Commonly known as:  PRILOSEC  Take 1 capsule (40 mg total) by mouth daily.     ONE TOUCH ULTRA SYSTEM KIT W/DEVICE Kit  1 kit by Does not apply route once.     promethazine 12.5 MG tablet  Commonly known as:  PHENERGAN  1-2 tabs 3x daily as needed for nausea     ramipril 10 MG capsule  Commonly known as:  ALTACE  TAKE ONE CAPSULE BY MOUTH DAILY     rosuvastatin 10 MG tablet  Commonly known as:  CRESTOR  Take 1 tablet (10 mg total) by mouth daily.        Allergies:  Allergies  Allergen Reactions  . Codeine     REACTION: makes her nervous  . Ibuprofen     REACTION: nervous  . Meperidine Hcl     REACTION: nasuea and vomitting  . Naproxen Sodium     REACTION: nervous    Past Medical History  Diagnosis Date  . Hypertension   . CAD (coronary artery disease)     Dr Gwenlyn Found  . Venous insufficiency   . Hyperlipidemia   . Diabetes mellitus   . GERD (gastroesophageal reflux disease)   . Gastritis   . Hx of colonic polyps   . Malignant neoplasm of small intestine   . Ventral hernia   . DJD (degenerative joint disease)   . Headache(784.0)   . Depression   . History of shingles   . History of anemia   . Vitamin B12 deficiency   . 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 Cabg x 5  1997  . Cesarean section    . Left knee surgery    . Tonsillectomy    . Tubal ligation    . 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 March Rummage  . Lap ventral hernia repair with incarcerated colon  09/2008    Dr. Lucia Gaskins    No family history on file.  Social History:  reports that she has never smoked. She has never used  smokeless tobacco. She reports that she does not drink alcohol or use illicit drugs.    Review of Systems   She is complaining about having nausea and abdominal pain since last night. She thinks she had significant abdominal pain diffusely associated with significant nausea late at night.  Also had mild diarrhea. Her abdominal pain is improved but she has a little nausea persisting.  No fever Had not had any nausea for at least a week previously with taking Victoza and no previous abdominal pain       Lipids: Has had excellent control of hypercholesterolemia with Crestor which she has taken for several years, on lipid-lowering therapy since her coronary bypass surgery.         Lab Results  Component Value Date   CHOL 104 03/25/2013   HDL 40 03/25/2013   LDLCALC 25 03/25/2013   LDLDIRECT 43.1 05/08/2012   TRIG 193* 03/25/2013   CHOLHDL 2.6 03/25/2013  The blood pressure has been previously well controlled and she on 25 mg HCTZ, ramipril and atenolol  HCTZ was increased on her last visit Her blood pressure is better today      No history of Numbness, tingling or burning in feet but occasional tingling in her left leg which has had severe injuries in the past Diabetic foot exam in 9/15 showed:  Decreased monofilament in toes and plantar surfaces, skin exam is normal. Pedal pulses: absent left DP   Physical Examination:  BP 131/86 mmHg  Pulse 80  Temp(Src) 98 F (36.7 C)  Resp 14  Ht 5' 6" (1.676 m)  Wt 167 lb 9.6 oz (76.023 kg)  BMI 27.06 kg/m2  SpO2 98%  She does not look dehydrated.  No pallor Bowel sounds do not appear increased. She has no tenderness of the abdomen No pedal edema present        ASSESSMENT:  Diabetes type 2, uncontrolled with BMI 27   She has had overall much better blood sugars with adding Victoza to her basal bolus regimen She is tolerating 1.2 mg daily Her average blood sugar is down at least 50-60 mg since starting the  Victoza Also blood sugars are fluctuating less See history of present illness for detailed discussion  Currently since she has mildly increased fasting blood sugar she may do well with switching her Lantus to the evening Not clear if she has coverage for Toujeo on her insurance Also she does need to check more readings after meals and adjust Regular Insulin based on meal size  Nausea/abdominal discomfort and mild diarrhea: Appears to have mild gastroenteritis and is improved this morning, does not appear to have pancreatitis clinically  HYPERTENSION: This appears to be better controlled   PLAN:   Start taking Lantus in the morning and adjust the dose as directed based on fasting blood sugars  May hold Victoza this evening since she has some nausea  Discussed how to adjust Regular Insulin for meal size   Start walking at the mall again  May take Phenergan as needed and if not improved discuss GI symptoms with PCP    Patient Instructions  Change lantus to 9 pm; if am sugar stays > 140 then go 56 units  Skip Victoza tonight  Regular insulin 9-10 units for small meals and for large meals upto 14 units  Please check blood sugars at least half the time about 2 hours after any meal and 3 times per week on waking up. Please bring blood sugar monitor to each visit. Recommended blood sugar levels about 2 hours after meal is 140-180 and on waking up 90-130      Counseling time over 50% of today's 25 minute visit   [name] 03/03/2014, 3:32 PM   Note: This office note was prepared with Estate agent. Any transcriptional errors that result from this process are unintentional.

## 2014-03-03 NOTE — Patient Instructions (Addendum)
Change lantus to 9 pm; if am sugar stays > 140 then go 56 units  Skip Victoza tonight  Regular insulin 9-10 units for small meals and for large meals upto 14 units  Please check blood sugars at least half the time about 2 hours after any meal and 3 times per week on waking up. Please bring blood sugar monitor to each visit. Recommended blood sugar levels about 2 hours after meal is 140-180 and on waking up 90-130

## 2014-03-04 LAB — FLOW CYTOMETRY - CHCC SATELLITE

## 2014-03-30 ENCOUNTER — Other Ambulatory Visit: Payer: Self-pay | Admitting: Internal Medicine

## 2014-04-01 ENCOUNTER — Telehealth: Payer: Self-pay | Admitting: Cardiovascular Disease

## 2014-04-01 DIAGNOSIS — E785 Hyperlipidemia, unspecified: Secondary | ICD-10-CM

## 2014-04-01 MED ORDER — ATORVASTATIN CALCIUM 20 MG PO TABS: 20.0000 mg | ORAL_TABLET | Freq: Every day | ORAL | Status: DC

## 2014-04-01 NOTE — Telephone Encounter (Signed)
Pt would like to get another medicine in the place of Crestor,it is too expensive. Please call the new medicine to [facility]-[phone].

## 2014-04-01 NOTE — Telephone Encounter (Signed)
Returned call to patient Dr.Berry advised to change crestor to lipitor 20 mg daily.Recheck fasting lipid and liver profile in 6 to 8 weeks.Lab order mailed.

## 2014-04-01 NOTE — Telephone Encounter (Signed)
Returned call to patient she stated crestor is too expensive.Stated she would like Dr.Berry to prescribe lipitor.Stated she cannot take zocor.Message sent to [facility].

## 2014-04-01 NOTE — Telephone Encounter (Signed)
Change Crestor 10 to Lipitor 20 mg, recheck lipid and liver profile in 6-8 weeks

## 2014-04-11 ENCOUNTER — Encounter: Payer: Self-pay | Admitting: Family Medicine

## 2014-04-11 ENCOUNTER — Ambulatory Visit (INDEPENDENT_AMBULATORY_CARE_PROVIDER_SITE_OTHER): Payer: Medicare Other | Admitting: Family Medicine

## 2014-04-11 VITALS — BP 150/90 | Temp 97.8°F | Wt 165.0 lb

## 2014-04-11 DIAGNOSIS — J329 Chronic sinusitis, unspecified: Secondary | ICD-10-CM | POA: Diagnosis not present

## 2014-04-11 DIAGNOSIS — B9789 Other viral agents as the cause of diseases classified elsewhere: Secondary | ICD-10-CM

## 2014-04-11 DIAGNOSIS — B349 Viral infection, unspecified: Secondary | ICD-10-CM | POA: Diagnosis not present

## 2014-04-11 MED ORDER — BENZONATATE 100 MG PO CAPS: 100.0000 mg | ORAL_CAPSULE | Freq: Three times a day (TID) | ORAL | Status: DC | PRN

## 2014-04-11 NOTE — Patient Instructions (Addendum)
Follow up with Dr. Larose Kells as both blood pressure #s slightly elevated today  Go ahead and schedule a follow up with him on Monday or Tuesday  You did not meet criteria for bacterial sinusitis. You do have viral sinusitis-if your symptoms get better then get worse or you have persistent symptoms beyond 10 days you may need antibiotics  I am calling in a cough medicine for you-pick it up if not too expensive, otherwise the OTC medicines you have are ok to take

## 2014-04-11 NOTE — Progress Notes (Signed)
[facility]  Subjective:  [name] is a 74 y.o. year old very pleasant female patient who presents with sinusitis concern.   Symptoms started 3 days ago with sinus congestion, stuffy nose, maxillary sinus pressure. Cough is dry. Scratchy sore throat improving. Slightly better than yesterday can now breath out of left side. Cough has not responded well to delsym. Keeping her up at night. No sick contacts.  -previous treatments: mucinex and delsym have been used. Low energy.   ROS-denies fever, SOB, NV, tooth pain  Pertinent Past Medical History- CLL, DM  With last a1c 8.4, HLD, HTN, IBS, CKD III. History of being seen by Dr. Velora Heckler in past after a sinusitis that later progressed to pneumonia. Denies history of seasonal allergies.   Medications- reviewed  Current Outpatient Prescriptions  Medication Sig Dispense Refill  . atenolol (TENORMIN) 50 MG tablet TAKE 1 TABLET BY MOUTH DAILY 90 tablet 1  . atorvastatin (LIPITOR) 20 MG tablet Take 1 tablet (20 mg total) by mouth daily. 30 tablet 6  . Blood Glucose Monitoring Suppl (ONE TOUCH ULTRA SYSTEM KIT) W/DEVICE KIT 1 kit by Does not apply route once. 1 each 0  . cholecalciferol (VITAMIN D) 1000 UNITS tablet Take 1,000 Units by mouth daily.      . clopidogrel (PLAVIX) 75 MG tablet TAKE 1 TABLET BY MOUTH DAILY 90 tablet 1  . clorazepate (TRANXENE) 7.5 MG tablet Take 1 tablet (7.5 mg total) by mouth as needed. 30 tablet 5  . docusate sodium (COLACE) 100 MG capsule Take 100 mg by mouth 2 (two) times daily.    Marland Kitchen glucose blood (ONE TOUCH ULTRA TEST) test strip USE AS DIRECTED to check blood sugar 3 times per day dx code E11.65 100 each 3  . HUMULIN R 100 UNIT/ML injection INJECT 8 UNITS INTO THE SKIN THREE TIMES DAILY BEFORE MEALS (Patient taking differently: INJECT 12 UNITS INTO THE SKIN THREE TIMES DAILY BEFORE MEALS) 10 mL 3  . hydrochlorothiazide (HYDRODIURIL) 25 MG tablet Take 1 tablet daily 90 tablet 1  . Insulin  Glargine (LANTUS) 100 UNIT/ML Solostar Pen Inject 50 Units into the skin at bedtime. 15 mL 3  . Insulin Pen Needle (BD PEN NEEDLE NANO U/F) 32G X 4 MM MISC Use 3 needles per day. 100 each 3  . Insulin Syringe-Needle U-100 (INSULIN SYRINGE .3CC/31GX5/16") 31G X 5/16" 0.3 ML MISC Use three needles per day 100 each 3  . Liraglutide (VICTOZA) 18 MG/3ML SOPN Inject 1.2 mg daily 6 mL 3  . metFORMIN (GLUCOPHAGE) 500 MG tablet Take 500 mg by mouth 2 (two) times daily with a meal.    . Multiple Vitamin (MULTIVITAMIN) capsule Take 1 capsule by mouth daily.      . niacin (NIASPAN) 500 MG CR tablet TAKE 1 TABLET BY MOUTH DAILY 90 tablet 2  . omeprazole (PRILOSEC) 40 MG capsule Take 1 capsule (40 mg total) by mouth daily. 90 capsule 1  . ramipril (ALTACE) 10 MG capsule Take 1 capsule (10 mg total) by mouth daily. 90 capsule 2  . aspirin 325 MG tablet Take 325 mg by mouth daily.    Marland Kitchen dicyclomine (BENTYL) 20 MG tablet Take 1 tablet (20 mg total) by mouth 3 (three) times daily as needed (for abdominal cramping). (Patient not taking: Reported on 04/11/2014) 50 tablet 5  . HYDROcodone-acetaminophen (NORCO/VICODIN) 5-325 MG per tablet Take 1 tablet by mouth 3 (three) times daily as needed. (Patient not taking: Reported on 04/11/2014) 45 tablet 0  .  neomycin-polymyxin-hydrocortisone (CORTISPORIN) otic solution 2-3 drops in each ear three times daily (Patient not taking: Reported on 02/25/2014) 10 mL 2   No current facility-administered medications for this visit.    Objective: BP 150/90 mmHg  Temp(Src) 97.8 F (36.6 C)  Wt 165 lb (74.844 kg) Gen: NAD, resting comfortably HEENT: Turbinates erythematous with clear drainage only, TM normal, pharynx mildly erythematous with no tonsilar exudate or edema, maxilary sinus tenderness noted CV: RRR no murmurs rubs or gallops Lungs: CTAB no crackles, wheeze, rhonchi Abdomen: soft/nontender/nondistended/normal bowel sounds.   Ext: no edema Skin: warm, dry, no rash    Assessment/Plan:  Viral sinusitis Does not meet criteria for bacterial sinusitis. No other signs bacterial infection on exam or history. Trial tessalon pearls for cough as codeine allergy. Avoid prednisone with a1c 8.4. Rest will be her best option along with OTC meds she has.    We discussed that a serious infection or illness is unlikely. We also discussed other potential etiologies none suggestive of bacterial infection at this time. We discussed treatment side effects, likely course, antibiotic misuse, transmission, and signs of developing a serious illness.  Finally, we reviewed reasons to return to care including if symptoms worsen or persist or new concerns arise.  Meds ordered this encounter  Medications  . benzonatate (TESSALON) 100 MG capsule    Sig: Take 1 capsule (100 mg total) by mouth 3 (three) times daily as needed for cough.    Dispense:  20 capsule    Refill:  0

## 2014-04-16 ENCOUNTER — Other Ambulatory Visit (INDEPENDENT_AMBULATORY_CARE_PROVIDER_SITE_OTHER): Payer: Medicare Other

## 2014-04-16 DIAGNOSIS — IMO0002 Reserved for concepts with insufficient information to code with codable children: Secondary | ICD-10-CM

## 2014-04-16 DIAGNOSIS — E1165 Type 2 diabetes mellitus with hyperglycemia: Secondary | ICD-10-CM

## 2014-04-16 LAB — BASIC METABOLIC PANEL
BUN: 22 mg/dL (ref 6–23)
CHLORIDE: 103 meq/L (ref 96–112)
CO2: 29 mEq/L (ref 19–32)
Calcium: 10 mg/dL (ref 8.4–10.5)
Creatinine, Ser: 1.38 mg/dL — ABNORMAL HIGH (ref 0.40–1.20)
GFR: 39.66 mL/min — ABNORMAL LOW (ref >60.00)
Glucose, Bld: 170 mg/dL — ABNORMAL HIGH (ref 70–99)
Potassium: 4.4 mEq/L (ref 3.5–5.1)
Sodium: 139 mEq/L (ref 135–145)

## 2014-04-16 LAB — HEMOGLOBIN A1C: Hgb A1c MFr Bld: 7.7 % — ABNORMAL HIGH (ref 4.6–6.5)

## 2014-04-21 ENCOUNTER — Other Ambulatory Visit: Payer: Self-pay | Admitting: *Deleted

## 2014-04-21 ENCOUNTER — Encounter: Payer: Self-pay | Admitting: Endocrinology

## 2014-04-21 ENCOUNTER — Ambulatory Visit (INDEPENDENT_AMBULATORY_CARE_PROVIDER_SITE_OTHER): Payer: Medicare Other | Admitting: Endocrinology

## 2014-04-21 VITALS — BP 124/68 | HR 82 | Temp 97.6°F | Resp 16 | Ht 66.0 in | Wt 168.0 lb

## 2014-04-21 DIAGNOSIS — IMO0002 Reserved for concepts with insufficient information to code with codable children: Secondary | ICD-10-CM

## 2014-04-21 DIAGNOSIS — E1165 Type 2 diabetes mellitus with hyperglycemia: Secondary | ICD-10-CM | POA: Diagnosis not present

## 2014-04-21 DIAGNOSIS — I10 Essential (primary) hypertension: Secondary | ICD-10-CM | POA: Diagnosis not present

## 2014-04-21 MED ORDER — METFORMIN HCL 500 MG PO TABS: 500.0000 mg | ORAL_TABLET | Freq: Two times a day (BID) | ORAL | Status: DC

## 2014-04-21 MED ORDER — "INSULIN SYRINGE 31G X 5/16"" 0.5 ML MISC": Status: DC

## 2014-04-21 NOTE — Progress Notes (Signed)
Patient ID: [employer], female   DOB: 1940/07/01, 74 y.o.   MRN: [id number]            Reason for Appointment: Followup for Type 2 Diabetes  Referring physician: Larose Kells  History of Present Illness:          Diagnosis: Type 2 diabetes mellitus, date of diagnosis: 1998       Past history:   She was treated with metformin at diagnosis when this had been continued until 2015 Subsequently Amaryl was also added several years ago and this has been continued Her blood sugars were under fair control between 2010 and early 2013 with A1c ranging from 7.4-9.4, mostly under 8% However since 08/2011 her A1c has been mostly over 8%  Insulin was added in 2014 with small doses of Lantus and this has been progressively increased She was started on mealtime insulin on her initial consultation in 9/15 because of high postprandial readings, sometimes over 300  Recent history:  Her blood sugars are relatively difficult to control despite taking large doses of insulin.   On the last visit since blood sugars were higher in the afternoon that in the morning her Lantus was changed to the morning time Currently she is taking Regular Insulin before meals also With adding Victoza in 02/2014 her blood sugars overall improved significantly Her A1c is now 7.7, previously 8.4 Today she is complaining about the cost of her insulin despite having insurance and now being in the donut hole  Current blood sugar patterns and problems identified: Fasting blood sugars: These are relatively higher now with some readings over 200 and averaging about 190 She has relatively high readings at times in the morning compared to the night before  Postprandial readings: she still has not checked as many readings after meals. She is not checking readings usually before or after lunch but blood sugars are mostly high after 5 PM with only one or 2 good readings Has variable readings after supper with a couple of readings over 200  also Readings after breakfast are not significantly high More recently has had respiratory infection and did have a reading of 119 when she did not eat much      Oral hypoglycemic drugs the patient is taking are:  Metformin 500 bid    Side effects from medications have been: None INSULIN regimen is described as: 50 units Lantus in a.m., Regular 12 units before meals Compliance with the medical regimen: good  Hypoglycemia: Minimal, had one low sugar of 67 after supper  Glucose monitoring:  done one time a day         Glucometer: One Touch.      Blood Glucose readings by time of day   PRE-MEAL Breakfast  9-10 AM   3-6 PM   8 PM-11 PM  Overall  Glucose range:  157-234   169-219   184-255   119-227    Mean:  192    180   192     Self-care: The diet that the patient has been following is: none, usually eating low fat meals Meals: 2-3 meals per day at irregular times. Dinner 6-7 pm Breakfast is a toast and sausage but sometimes oatmeal, usually eating sandwiches for meals, snacks will be with crackers         Exercise: Walking at mall at times Dietician visit, most recent: never.  She saw the CDE in 9/15  Weight history: 150-170 previously  Wt Readings from Last 3 Encounters:  04/21/14 168 lb (76.204 kg)  04/11/14 165 lb (74.844 kg)  03/03/14 167 lb 9.6 oz (76.023 kg)    Glycemic control:   Lab Results  Component Value Date   HGBA1C 7.7* 04/16/2014   HGBA1C 8.4* 02/05/2014   HGBA1C 8.7* 11/06/2013   Lab Results  Component Value Date   MICROALBUR 8.4* 11/06/2013   LDLCALC 25 03/25/2013   CREATININE 1.38* 04/16/2014        Medication List       This list is accurate as of: 04/21/14  8:39 PM.  Always use your most recent med list.               aspirin 325 MG tablet  Take 325 mg by mouth daily.     atenolol 50 MG tablet  Commonly known as:  TENORMIN  TAKE 1 TABLET BY MOUTH DAILY     atorvastatin 20 MG tablet  Commonly known as:  LIPITOR  Take 1  tablet (20 mg total) by mouth daily.     benzonatate 100 MG capsule  Commonly known as:  TESSALON  Take 1 capsule (100 mg total) by mouth 3 (three) times daily as needed for cough.     cholecalciferol 1000 UNITS tablet  Commonly known as:  VITAMIN D  Take 1,000 Units by mouth daily.     clopidogrel 75 MG tablet  Commonly known as:  PLAVIX  TAKE 1 TABLET BY MOUTH DAILY     clorazepate 7.5 MG tablet  Commonly known as:  TRANXENE  Take 1 tablet (7.5 mg total) by mouth as needed.     dicyclomine 20 MG tablet  Commonly known as:  BENTYL  Take 1 tablet (20 mg total) by mouth 3 (three) times daily as needed (for abdominal cramping).     docusate sodium 100 MG capsule  Commonly known as:  COLACE  Take 100 mg by mouth 2 (two) times daily.     glucose blood test strip  Commonly known as:  ONE TOUCH ULTRA TEST  USE AS DIRECTED to check blood sugar 3 times per day dx code E11.65     HUMULIN R 100 units/mL injection  Generic drug:  insulin regular  INJECT 8 UNITS INTO THE SKIN THREE TIMES DAILY BEFORE MEALS     hydrochlorothiazide 25 MG tablet  Commonly known as:  HYDRODIURIL  Take 1 tablet daily     HYDROcodone-acetaminophen 5-325 MG per tablet  Commonly known as:  NORCO/VICODIN  Take 1 tablet by mouth 3 (three) times daily as needed.     Insulin Glargine 100 UNIT/ML Solostar Pen  Commonly known as:  LANTUS  Inject 50 Units into the skin at bedtime.     Insulin Pen Needle 32G X 4 MM Misc  Commonly known as:  BD PEN NEEDLE NANO U/F  Use 3 needles per day.     INSULIN SYRINGE .5CC/31GX5/16" 31G X 5/16" 0.5 ML Misc  Use 5 syringes per day     Liraglutide 18 MG/3ML Sopn  Commonly known as:  VICTOZA  Inject 1.2 mg daily     metFORMIN 500 MG tablet  Commonly known as:  GLUCOPHAGE  Take 1 tablet (500 mg total) by mouth 2 (two) times daily with a meal.     multivitamin capsule  Take 1 capsule by mouth daily.     neomycin-polymyxin-hydrocortisone otic solution  Commonly  known as:  CORTISPORIN  2-3 drops in each  ear three times daily     niacin 500 MG CR tablet  Commonly known as:  NIASPAN  TAKE 1 TABLET BY MOUTH DAILY     omeprazole 40 MG capsule  Commonly known as:  PRILOSEC  Take 1 capsule (40 mg total) by mouth daily.     ONE TOUCH ULTRA SYSTEM KIT W/DEVICE Kit  1 kit by Does not apply route once.     ramipril 10 MG capsule  Commonly known as:  ALTACE  Take 1 capsule (10 mg total) by mouth daily.        Allergies:  Allergies  Allergen Reactions  . Codeine     REACTION: makes her nervous  . Ibuprofen     REACTION: nervous  . Meperidine Hcl     REACTION: nasuea and vomitting  . Naproxen Sodium     REACTION: nervous    Past Medical History  Diagnosis Date  . Hypertension   . CAD (coronary artery disease)     Dr Gwenlyn Found  . Venous insufficiency   . Hyperlipidemia   . Diabetes mellitus   . GERD (gastroesophageal reflux disease)   . Gastritis   . Hx of colonic polyps   . Malignant neoplasm of small intestine   . Ventral hernia   . DJD (degenerative joint disease)   . Headache(784.0)   . Depression   . History of shingles   . History of anemia   . Vitamin B12 deficiency   . 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 Cabg x 5  1997  . Cesarean section    . Left knee surgery    . Tonsillectomy    . Tubal ligation    . 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 March Rummage  . Lap ventral hernia repair with incarcerated colon  09/2008    Dr. Lucia Gaskins    Family History  Problem Relation Age of Onset  . Heart disease Mother   . Heart disease Father   . Hypertension Child     Social History:  reports that she has never smoked. She has never used smokeless tobacco. She reports that she does not drink alcohol or use illicit drugs.    Review of Systems   She is complaining about cough and respiratory symptoms and has discussed with PCP open-ended was viral        Lipids: Has had excellent control of hypercholesterolemia with Crestor which she has taken for several years, on lipid-lowering therapy since her coronary bypass surgery.         Lab Results  Component Value Date   CHOL 104 03/25/2013   HDL 40 03/25/2013   LDLCALC 25 03/25/2013   LDLDIRECT 43.1 05/08/2012   TRIG 193* 03/25/2013   CHOLHDL 2.6 03/25/2013                  The blood pressure has been  well controlled and she on 25 mg HCTZ, ramipril and atenolol  No hypokalemia  Lab Results  Component Value Date   CREATININE 1.38* 04/16/2014   BUN 22 04/16/2014   NA 139 04/16/2014   K 4.4 04/16/2014   CL 103 04/16/2014   CO2 29 04/16/2014   Mild CKD: Stable   Lab Results  Component Value Date   CREATININE 1.38* 04/16/2014   CREATININE 1.33* 02/25/2014   CREATININE 1.22* 02/05/2014       No history of Numbness, tingling  or burning in feet but occasional tingling in her left leg which has had severe injuries in the past Diabetic foot exam in 9/15 showed:  Decreased monofilament in toes and plantar surfaces, skin exam is normal. Pedal pulses: absent left DP   Physical Examination:  BP 124/68 mmHg  Pulse 82  Temp(Src) 97.6 F (36.4 C)  Resp 16  Ht 5' 6"  (1.676 m)  Wt 168 lb (76.204 kg)  BMI 27.13 kg/m2  SpO2 97%   No pedal edema present        ASSESSMENT:  Diabetes type 2, uncontrolled with BMI 27   See history of present illness for detailed discussion of her current blood sugar patterns, management and problems identified  She has higher readings on this visit even though her A1c appears to be better than usual  Previously had better blood sugars with adding Victoza to her basal bolus regimen However with changing her Lantus to the morning her fasting readings have gone up indicating less than 24 control However she has reduced her Lantus by at least 4 units on her own probably because of the cost She is complaining of significant expense of current medications  including her regular insulin She is compliant with her insulin and generally compliant with diet and exercise  HYPERTENSION: This appears to be consistently controlled  Mild CKD: Has minimal increase in creatinine now  PLAN:   She will switch her Lantus to NPH when she runs out  Meanwhile will add 8 units in the evening and given her instructions of how to adjust the dose based on fasting blood sugar trend over 3 days  Since the sugars are higher in the afternoon she'll increase her lunchtime dose to 14 regular  Reminded her that she does need to check more readings after meals and adjust Regular Insulin based on meal size  When Lantus runs out she will switch to NPH twice a day as detailed in the patient instructions; she will start with 34 units in the morning and 18 at night and adjust evening dose based on fasting reading  She'll continue taking Regular Insulin but will try getting the generic brand from [facility]  Detailed review of insulin instructions were done with the patient  Given her forms and application for indigent program for Victoza  Follow-up with diabetes nurse educator about a week after she starts the new regimen  Counseling time over 50% of today's 25 minute visit    Counseling time over 50% of today's 25 minute visit   [place] 04/21/2014, 8:39 PM   Note: This office note was prepared with Dragon voice recognition system technology. Any transcriptional errors that result from this process are unintentional.

## 2014-04-21 NOTE — Patient Instructions (Addendum)
Lantus 50 units in am and ADD 8 units at supper also, adjust pm dose up 2 units more if am sugar still >140.  May need to reduce the dose down to 6 units if morning sugars start getting below 90  HUMULIN R: Increase the dose at lunchtime to 14 units unless eating a smaller meal and continue 12 units with other meals This will be replaced with NOVOLIN R insulin in exactly the same doses when you run out of the Humulin  When Lantus runs out start taking NOVOLIN N insulin twice a day as follows:  Morning dose at breakfast time: 34 units, may take with the regular insulin and may be mixed in the same syringe BEDTIME dose: 18 units.  This will need to be adjusted up or down 2 units based on the morning sugar which needs to be between 90-130  Continue Victoza

## 2014-04-22 ENCOUNTER — Encounter: Payer: Self-pay | Admitting: Internal Medicine

## 2014-04-30 ENCOUNTER — Other Ambulatory Visit: Payer: Self-pay

## 2014-05-20 ENCOUNTER — Encounter: Payer: Medicare Other | Admitting: Nutrition

## 2014-05-27 ENCOUNTER — Ambulatory Visit (INDEPENDENT_AMBULATORY_CARE_PROVIDER_SITE_OTHER): Payer: Medicare Other | Admitting: Cardiovascular Disease

## 2014-05-27 ENCOUNTER — Encounter: Payer: Self-pay | Admitting: Cardiovascular Disease

## 2014-05-27 VITALS — BP 164/86 | HR 86 | Ht 66.5 in | Wt 167.9 lb

## 2014-05-27 DIAGNOSIS — I10 Essential (primary) hypertension: Secondary | ICD-10-CM

## 2014-05-27 DIAGNOSIS — E785 Hyperlipidemia, unspecified: Secondary | ICD-10-CM | POA: Diagnosis not present

## 2014-05-27 DIAGNOSIS — I2510 Atherosclerotic heart disease of native coronary artery without angina pectoris: Secondary | ICD-10-CM

## 2014-05-27 MED ORDER — LOSARTAN POTASSIUM 100 MG PO TABS: 100.0000 mg | ORAL_TABLET | Freq: Every day | ORAL | Status: DC

## 2014-05-27 NOTE — Progress Notes (Signed)
05/27/2014 [name]   08-26-1940  [id number]  Primary Physician Kathlene November, MD Primary Cardiologist: Lorretta Harp MD Renae Gloss   HPI:  [name] is a 74 year old mildly overweight widowed Caucasian female mother of 2 living children (son at age 36 died of an overdose), grandmother of 5 grandchildren is a patient of Dr. Terance Ice. I last saw her in the office 03/25/13.Her cardiac risk factor profile is walkover treated diabetes, hypertension and hyperlipidemia. She has a strong family history of heart disease with a father who died of a myocardial infarction at age 52 and her mother at age 50. She had a heart attack in 1997 at which time I performed cardiac catheterization. Apparently I placed a balloon pump at that time and sent her to open heart surgery. Dr. Collier Salina eventuate a coronary artery bypass grafting x5. She had her last catheterization performed by Dr. Rollene Fare December 2008 revealing patent grafts with normal LV function. She had a negative Myoview back in 2012. She is currently asymptomatic. Since I saw her last she has been completely asymptomatic.   Current Outpatient Prescriptions  Medication Sig Dispense Refill  . aspirin 325 MG tablet Take 325 mg by mouth daily.    Marland Kitchen atenolol (TENORMIN) 50 MG tablet TAKE 1 TABLET BY MOUTH DAILY 90 tablet 1  . atorvastatin (LIPITOR) 20 MG tablet Take 1 tablet (20 mg total) by mouth daily. 30 tablet 6  . benzonatate (TESSALON) 100 MG capsule Take 1 capsule (100 mg total) by mouth 3 (three) times daily as needed for cough. 20 capsule 0  . Blood Glucose Monitoring Suppl (ONE TOUCH ULTRA SYSTEM KIT) W/DEVICE KIT 1 kit by Does not apply route once. 1 each 0  . cholecalciferol (VITAMIN D) 1000 UNITS tablet Take 1,000 Units by mouth daily.      . clopidogrel (PLAVIX) 75 MG tablet TAKE 1 TABLET BY MOUTH DAILY 90 tablet 1  . clorazepate (TRANXENE) 7.5 MG tablet Take 1 tablet (7.5 mg total) by mouth as needed. 30  tablet 5  . dicyclomine (BENTYL) 20 MG tablet Take 1 tablet (20 mg total) by mouth 3 (three) times daily as needed (for abdominal cramping). 50 tablet 5  . docusate sodium (COLACE) 100 MG capsule Take 100 mg by mouth 2 (two) times daily.    Marland Kitchen glucose blood (ONE TOUCH ULTRA TEST) test strip USE AS DIRECTED to check blood sugar 3 times per day dx code E11.65 100 each 3  . hydrochlorothiazide (HYDRODIURIL) 25 MG tablet Take 1 tablet daily 90 tablet 1  . HYDROcodone-acetaminophen (NORCO/VICODIN) 5-325 MG per tablet Take 1 tablet by mouth 3 (three) times daily as needed. 45 tablet 0  . insulin NPH Human (HUMULIN N,NOVOLIN N) 100 UNIT/ML injection Inject 34 units into the skin every morning and inject 18 units into the skin at bedtime.    . Insulin Pen Needle (BD PEN NEEDLE NANO U/F) 32G X 4 MM MISC Use 3 needles per day. 100 each 3  . insulin regular (NOVOLIN R,HUMULIN R) 100 units/mL injection Inject into the skin 3 (three) times daily before meals. Inject 12 units every morning and evening with meals and inject 14 units with lunch.    . Insulin Syringe-Needle U-100 (INSULIN SYRINGE .5CC/31GX5/16") 31G X 5/16" 0.5 ML MISC Use 5 syringes per day 150 each 3  . Liraglutide (VICTOZA) 18 MG/3ML SOPN Inject 1.2 mg daily 6 mL 3  . metFORMIN (GLUCOPHAGE) 500 MG tablet Take 1 tablet (500 mg total)  by mouth 2 (two) times daily with a meal. 60 tablet 3  . Multiple Vitamin (MULTIVITAMIN) capsule Take 1 capsule by mouth daily.      Marland Kitchen neomycin-polymyxin-hydrocortisone (CORTISPORIN) otic solution 2-3 drops in each ear three times daily 10 mL 2  . niacin (NIASPAN) 500 MG CR tablet TAKE 1 TABLET BY MOUTH DAILY 90 tablet 2  . omeprazole (PRILOSEC) 40 MG capsule Take 1 capsule (40 mg total) by mouth daily. 90 capsule 1  . losartan (COZAAR) 100 MG tablet Take 1 tablet (100 mg total) by mouth daily. 30 tablet 6   No current facility-administered medications for this visit.    Allergies  Allergen Reactions  . Codeine       REACTION: makes her nervous  . Ibuprofen     REACTION: nervous  . Meperidine Hcl     REACTION: nasuea and vomitting  . Naproxen Sodium     REACTION: nervous    History   Social History  . Marital Status: Widowed    Spouse Name: N/A  . Number of Children: 2  . Years of Education: N/A   Occupational History  . retired-- [employer]    .     Social History Main Topics  . Smoking status: Never Smoker   . Smokeless tobacco: Never Used     Comment: NEVER SMOKED  . Alcohol Use: No  . Drug Use: No  . Sexual Activity: Not on file   Other Topics Concern  . Not on file   Social History Narrative   Lives by herself, lost husband ~ 2004     Review of Systems: General: negative for chills, fever, night sweats or weight changes.  Cardiovascular: negative for chest pain, dyspnea on exertion, edema, orthopnea, palpitations, paroxysmal nocturnal dyspnea or shortness of breath Dermatological: negative for rash Respiratory: negative for cough or wheezing Urologic: negative for hematuria Abdominal: negative for nausea, vomiting, diarrhea, bright red blood per rectum, melena, or hematemesis Neurologic: negative for visual changes, syncope, or dizziness All other systems reviewed and are otherwise negative except as noted above.    Blood pressure 164/86, pulse 86, height 5' 6.5" (1.689 m), weight 167 lb 14.4 oz (76.159 kg).  General appearance: alert and no distress Neck: no adenopathy, no carotid bruit, no JVD, supple, symmetrical, trachea midline and thyroid not enlarged, symmetric, no tenderness/mass/nodules Lungs: clear to auscultation bilaterally Heart: regular rate and rhythm, S1, S2 normal, no murmur, click, rub or gallop Extremities: extremities normal, atraumatic, no cyanosis or edema  EKG normal sinus rhythm at 86 with right bundle-branch block. Procedure reviewed this EKG  ASSESSMENT AND PLAN:   Hyperlipidemia History of hyperlipidemia on atorvastatin. We will  recheck a lipid and liver profile   Essential hypertension History of hypertension and blood pressure measured today at 164/86. She is on atenolol and ramipril as well as Hydrocort thiazide. She does have a dry hacking cough. I'm going to transition her from an ACE inhibitor to losartan and instructed her to take daily blood pressures. She'll be seen by Erasmo Downer in the office in one month one to 2 months for follow-up   Coronary atherosclerosis History of CAD status post acute myocardial infarction in 1997 which time I performed crit catheterization on Saturday and referred her for open heart surgery. I placed an intra-aortic balloon pump at that time. Dr. Tharon Aquas Trigt placed 5 grafts. Her last Myoview was in 2012 which was nonischemic. She denies chest pain or shortness of breath.  Lorretta Harp MD FACP,FACC,FAHA, [facility] 05/27/2014 3:10 PM

## 2014-05-27 NOTE — Assessment & Plan Note (Signed)
History of CAD status post acute myocardial infarction in 1997 which time I performed crit catheterization on Saturday and referred her for open heart surgery. I placed an intra-aortic balloon pump at that time. Dr. Tharon Aquas Trigt placed 5 grafts. Her last Myoview was in 2012 which was nonischemic. She denies chest pain or shortness of breath.

## 2014-05-27 NOTE — Patient Instructions (Signed)
We request that you follow-up in: 2 months with the pharmacist, Erasmo Downer and in 1 year with Dr Andria Rhein will receive a reminder letter in the mail two months in advance. If you don't receive a letter, please call our office to schedule the follow-up appointment.   Stop Altace Start Losartan 100mg  daily

## 2014-05-27 NOTE — Assessment & Plan Note (Signed)
History of hyperlipidemia on atorvastatin. We will recheck a lipid and liver profile 

## 2014-05-27 NOTE — Assessment & Plan Note (Signed)
History of hypertension and blood pressure measured today at 164/86. She is on atenolol and ramipril as well as Hydrocort thiazide. She does have a dry hacking cough. I'm going to transition her from an ACE inhibitor to losartan and instructed her to take daily blood pressures. She'll be seen by [name] in the office in one month one to 2 months for follow-up

## 2014-06-03 ENCOUNTER — Ambulatory Visit (INDEPENDENT_AMBULATORY_CARE_PROVIDER_SITE_OTHER): Payer: Medicare Other | Admitting: Endocrinology

## 2014-06-03 ENCOUNTER — Encounter: Payer: Self-pay | Admitting: Endocrinology

## 2014-06-03 VITALS — BP 158/92 | HR 84 | Temp 97.9°F | Resp 14 | Ht 66.5 in | Wt 168.6 lb

## 2014-06-03 DIAGNOSIS — I10 Essential (primary) hypertension: Secondary | ICD-10-CM

## 2014-06-03 DIAGNOSIS — E1165 Type 2 diabetes mellitus with hyperglycemia: Secondary | ICD-10-CM

## 2014-06-03 DIAGNOSIS — I2510 Atherosclerotic heart disease of native coronary artery without angina pectoris: Secondary | ICD-10-CM | POA: Diagnosis not present

## 2014-06-03 DIAGNOSIS — N289 Disorder of kidney and ureter, unspecified: Secondary | ICD-10-CM

## 2014-06-03 DIAGNOSIS — IMO0002 Reserved for concepts with insufficient information to code with codable children: Secondary | ICD-10-CM

## 2014-06-03 NOTE — Progress Notes (Signed)
Patient ID: [employer], female   DOB: December 15, 1940, 74 y.o.   MRN: [id number]            Reason for Appointment: Followup for Type 2 Diabetes  Referring physician: Larose Kells  History of Present Illness:          Diagnosis: Type 2 diabetes mellitus, date of diagnosis: 1998       Past history:   She was treated with metformin at diagnosis when this had been continued until 2015 Subsequently Amaryl was also added several years ago and this has been continued Her blood sugars were under fair control between 2010 and early 2013 with A1c ranging from 7.4-9.4, mostly under 8% However since 08/2011 her A1c has been mostly over 8%  Insulin was added in 2014 with small doses of Lantus and this has been progressively increased She was started on mealtime insulin on her initial consultation in 9/15 because of high postprandial readings, sometimes over 300  Recent history:   INSULIN regimen: Regular 14 units before meals.  NPH 34 units in the morning and 18 at night  Her blood sugars are still relatively difficult to control despite taking large doses of insulin along with Victoza.   With adding Victoza in 02/2014 her blood sugars were somewhat better with A1c coming down below 8% However she was concerned about the cost of her Lantus insulin in April and this was changed to NPH insulin twice a day Today she is complaining about the number of injections she has to take  Current blood sugar patterns and problems identified: Fasting blood sugars: These are still mostly high but overall slightly better compared to Lantus Postprandial readings: she still has not checked blood sugars after lunch and only occasionally after breakfast; these appear to be fairly good but not consistently as many readings after meals. She is not checking readings usually before or after lunch but blood sugars are mostly high after 5 PM with only one or 2 good readings Has variable readings after supper but mostly running  high with the highest reading 322 last night with eating spaghetti      Oral hypoglycemic drugs the patient is taking are:  Metformin 500 bid    Side effects from medications have been: None Compliance with the medical regimen: good, she is taking her insulin consistently  Hypoglycemia: None  Glucose monitoring:  done 1-2 time a day         Glucometer: One Touch.      Blood Glucose readings by time of day from download:   Mean values apply above for all meters except median for One Touch  PRE-MEAL Fasting Lunch Dinner  PCS  Overall  Glucose range: 157-218  166-222  ?  193   131-322    Mean/median:  180      187     Self-care: The diet that the patient has been following is: none, usually eating low fat meals Meals: 2-3 meals per day at irregular times. Dinner 6-7 pm Breakfast is a toast and sausage but sometimes oatmeal, usually eating sandwiches for meals, snacks will be with crackers         Dietician visit, most recent: never.  She saw the CDE in 02/2014              Exercise:  none recently because of fatigue  Weight history: 150-170 previously  Wt Readings from Last 3 Encounters:  06/03/14 168 lb 9.6 oz (76.476 kg)  05/27/14 167 lb 14.4  oz (76.159 kg)  04/21/14 168 lb (76.204 kg)    Glycemic control:   Lab Results  Component Value Date   HGBA1C 7.7* 04/16/2014   HGBA1C 8.4* 02/05/2014   HGBA1C 8.7* 11/06/2013   Lab Results  Component Value Date   MICROALBUR 8.4* 11/06/2013   LDLCALC 25 03/25/2013   CREATININE 1.38* 04/16/2014        Medication List       This list is accurate as of: 06/03/14 11:53 AM.  Always use your most recent med list.               aspirin 325 MG tablet  Take 325 mg by mouth daily.     atenolol 50 MG tablet  Commonly known as:  TENORMIN  TAKE 1 TABLET BY MOUTH DAILY     atorvastatin 20 MG tablet  Commonly known as:  LIPITOR  Take 1 tablet (20 mg total) by mouth daily.     benzonatate 100 MG capsule  Commonly known as:   TESSALON  Take 1 capsule (100 mg total) by mouth 3 (three) times daily as needed for cough.     cholecalciferol 1000 UNITS tablet  Commonly known as:  VITAMIN D  Take 1,000 Units by mouth daily.     clopidogrel 75 MG tablet  Commonly known as:  PLAVIX  TAKE 1 TABLET BY MOUTH DAILY     clorazepate 7.5 MG tablet  Commonly known as:  TRANXENE  Take 1 tablet (7.5 mg total) by mouth as needed.     dicyclomine 20 MG tablet  Commonly known as:  BENTYL  Take 1 tablet (20 mg total) by mouth 3 (three) times daily as needed (for abdominal cramping).     docusate sodium 100 MG capsule  Commonly known as:  COLACE  Take 100 mg by mouth 2 (two) times daily.     glucose blood test strip  Commonly known as:  ONE TOUCH ULTRA TEST  USE AS DIRECTED to check blood sugar 3 times per day dx code E11.65     hydrochlorothiazide 25 MG tablet  Commonly known as:  HYDRODIURIL  Take 1 tablet daily     HYDROcodone-acetaminophen 5-325 MG per tablet  Commonly known as:  NORCO/VICODIN  Take 1 tablet by mouth 3 (three) times daily as needed.     insulin NPH Human 100 UNIT/ML injection  Commonly known as:  HUMULIN N,NOVOLIN N  Inject 34 units into the skin every morning and inject 18 units into the skin at bedtime.     Insulin Pen Needle 32G X 4 MM Misc  Commonly known as:  BD PEN NEEDLE NANO U/F  Use 3 needles per day.     insulin regular 100 units/mL injection  Commonly known as:  NOVOLIN R,HUMULIN R  Inject into the skin 3 (three) times daily before meals. Inject 12 units every morning and evening with meals and inject 14 units with lunch.     INSULIN SYRINGE .5CC/31GX5/16" 31G X 5/16" 0.5 ML Misc  Use 5 syringes per day     Liraglutide 18 MG/3ML Sopn  Commonly known as:  VICTOZA  Inject 1.2 mg daily     losartan 100 MG tablet  Commonly known as:  COZAAR  Take 1 tablet (100 mg total) by mouth daily.     metFORMIN 500 MG tablet  Commonly known as:  GLUCOPHAGE  Take 1 tablet (500 mg total)  by mouth 2 (two) times daily with a meal.  multivitamin capsule  Take 1 capsule by mouth daily.     neomycin-polymyxin-hydrocortisone otic solution  Commonly known as:  CORTISPORIN  2-3 drops in each ear three times daily     niacin 500 MG CR tablet  Commonly known as:  NIASPAN  TAKE 1 TABLET BY MOUTH DAILY     omeprazole 40 MG capsule  Commonly known as:  PRILOSEC  Take 1 capsule (40 mg total) by mouth daily.     ONE TOUCH ULTRA SYSTEM KIT W/DEVICE Kit  1 kit by Does not apply route once.        Allergies:  Allergies  Allergen Reactions  . Codeine     REACTION: makes her nervous  . Ibuprofen     REACTION: nervous  . Meperidine Hcl     REACTION: nasuea and vomitting  . Naproxen Sodium     REACTION: nervous    Past Medical History  Diagnosis Date  . Hypertension   . CAD (coronary artery disease)     Dr Gwenlyn Found  . Venous insufficiency   . Hyperlipidemia   . Diabetes mellitus   . GERD (gastroesophageal reflux disease)   . Gastritis   . Hx of colonic polyps   . Malignant neoplasm of small intestine   . Ventral hernia   . DJD (degenerative joint disease)   . Headache(784.0)   . Depression   . History of shingles   . History of anemia   . Vitamin B12 deficiency   . 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 Cabg x 5  1997  . Cesarean section    . Left knee surgery    . Tonsillectomy    . Tubal ligation    . 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 March Rummage  . Lap ventral hernia repair with incarcerated colon  09/2008    Dr. Lucia Gaskins    Family History  Problem Relation Age of Onset  . Heart disease Mother   . Heart disease Father   . Hypertension Child     Social History:  reports that she has never smoked. She has never used smokeless tobacco. She reports that she does not drink alcohol or use illicit drugs.    Review of Systems   Feels tired and is not able to do much, feels like  she has to rest more.  Has not discussed with PCP  She is complaining about cough for 2 months and her ramipril was changed to losartan but she still has a cough       Lipids: Has had excellent control of hypercholesterolemia with Crestor which she has taken for several years, on lipid-lowering therapy since her coronary bypass surgery.         Lab Results  Component Value Date   CHOL 104 03/25/2013   HDL 40 03/25/2013   LDLCALC 25 03/25/2013   LDLDIRECT 43.1 05/08/2012   TRIG 193* 03/25/2013   CHOLHDL 2.6 03/25/2013                  The blood pressure has been not well controlled recently and she on 25 mg HCTZ, Losartan, and atenolol.  Is going to be followed by cardiologist for this  No hypokalemia  Lab Results  Component Value Date   CREATININE 1.38* 04/16/2014   BUN 22 04/16/2014   NA 139 04/16/2014   K 4.4 04/16/2014   CL 103 04/16/2014  CO2 29 04/16/2014   Mild CKD: Stable   Lab Results  Component Value Date   CREATININE 1.38* 04/16/2014   CREATININE 1.33* 02/25/2014   CREATININE 1.22* 02/05/2014       No history of Numbness, tingling or burning in feet but occasional tingling in her left leg which has had severe injuries in the past Diabetic foot exam in 9/15 showed:  Decreased monofilament in toes and plantar surfaces, skin exam is normal. Pedal pulses: absent left DP  She is asking about itching and rash on her right forearm and in spots in different places recently  Physical Examination:  BP 158/92 mmHg  Pulse 84  Temp(Src) 97.9 F (36.6 C)  Resp 14  Ht 5' 6.5" (1.689 m)  Wt 168 lb 9.6 oz (76.476 kg)  BMI 26.81 kg/m2  SpO2 98%  She has small areas of light reddish macular rash on the right forearm        ASSESSMENT:  Diabetes type 2, uncontrolled with BMI 27   See history of present illness for detailed discussion of her current blood sugar patterns, management and problems identified  She has been getting blood sugars about the same with  using NPH compared to Lantus She is still requiring large doses of insulin and blood sugars are mostly high Most of her high readings appear to be fasting and after supper but she has not done enough readings during the day Blood sugars are higher on Monday she is eating more carbohydrate and high her glycemic index foods Since she has benefited from Victoza to some degree will continue this but asked her to apply for assistance from the drug company  She was shown the V-go pump and discussed that this would allow her better control with less insulin as well as avoiding all the injection she is taking but she is concerned about the cost.  Currently being in the donut hole she will not be covered  HYPERTENSION: This appears to be not consistently controlled, to be followed by cardiologist   PLAN:   She will increase the dose of NPH by 4 units in the morning and 2 at night, will not increase nighttime dose aggressively because of potential for overnight hypoglycemia  She will go up at least 2 units on her regular insulin at lunch and supper  Needs to avoid high glycemic index foods  Encouraged her to start walking when she can as she needs to be exercising  She will follow-up with her PCP for various issues such as fatigue, skin rash, cough, blood pressure control  Patient Instructions  N insulin 38 units in the morning and 20 at night  Increase Regular insulin to 16 at lunch and supper and stay on 14 at breakfast  Check blood sugars on waking up .Marland Kitchen 3-4 .Marland Kitchen times a week Also check blood sugars about 2 hours after a meal and do this after different meals by rotation  Recommended blood sugar levels on waking up is 90-130 and about 2 hours after meal is 140-180 Please bring blood sugar monitor to each visit.    Counseling time on subjects discussed above is over 50% of today's 25 minute visit   Tesean Stump 06/03/2014, 11:53 AM   Note: This office note was prepared with [employer]. Any transcriptional errors that result from this process are unintentional.

## 2014-06-03 NOTE — Patient Instructions (Signed)
N insulin 38 units in the morning and 20 at night  Increase Regular insulin to 16 at lunch and supper and stay on 14 at breakfast  Check blood sugars on waking up .Marland Kitchen 3-4 .Marland Kitchen times a week Also check blood sugars about 2 hours after a meal and do this after different meals by rotation  Recommended blood sugar levels on waking up is 90-130 and about 2 hours after meal is 140-180 Please bring blood sugar monitor to each visit.

## 2014-06-06 ENCOUNTER — Other Ambulatory Visit: Payer: Self-pay | Admitting: Endocrinology

## 2014-06-08 ENCOUNTER — Ambulatory Visit (INDEPENDENT_AMBULATORY_CARE_PROVIDER_SITE_OTHER): Payer: Medicare Other | Admitting: Internal Medicine

## 2014-06-08 ENCOUNTER — Encounter: Payer: Self-pay | Admitting: Internal Medicine

## 2014-06-08 VITALS — BP 138/78 | HR 76 | Temp 97.6°F | Ht 67.0 in | Wt 168.2 lb

## 2014-06-08 DIAGNOSIS — I10 Essential (primary) hypertension: Secondary | ICD-10-CM | POA: Diagnosis not present

## 2014-06-08 DIAGNOSIS — M150 Primary generalized (osteo)arthritis: Secondary | ICD-10-CM | POA: Diagnosis not present

## 2014-06-08 DIAGNOSIS — I2510 Atherosclerotic heart disease of native coronary artery without angina pectoris: Secondary | ICD-10-CM

## 2014-06-08 DIAGNOSIS — M159 Polyosteoarthritis, unspecified: Secondary | ICD-10-CM

## 2014-06-08 LAB — BASIC METABOLIC PANEL
BUN: 25 mg/dL — ABNORMAL HIGH (ref 6–23)
CALCIUM: 10.2 mg/dL (ref 8.4–10.5)
CO2: 25 meq/L (ref 19–32)
Chloride: 105 mEq/L (ref 96–112)
Creatinine, Ser: 1.3 mg/dL — ABNORMAL HIGH (ref 0.40–1.20)
GFR: 42.47 mL/min — AB (ref >60.00)
GLUCOSE: 149 mg/dL — AB (ref 70–99)
Potassium: 3.9 mEq/L (ref 3.5–5.1)
Sodium: 138 mEq/L (ref 135–145)

## 2014-06-08 MED ORDER — HYDROCODONE-ACETAMINOPHEN 5-325 MG PO TABS: 1.0000 | ORAL_TABLET | Freq: Three times a day (TID) | ORAL | Status: DC | PRN

## 2014-06-08 NOTE — Progress Notes (Signed)
Subjective:    Patient ID: [name], female    DOB: [birth year], 74 y.o.   MRN: [id number]  DOS:  06/08/2014 Type of visit - description :  Acute Interval history: Started with cough early in April, dry, day or night, persistent. Was seen at the [facility], rx Tessalon with no help. She was seen by cardiology and on 05/27/2014 ACE inhibitors were changed to losartan. Since then the cough is slightly better  Developed a rash on the right arm, leg and abdomen, used a steroid cream and is now better.  Hypertension, the last 2 times she went to visit one of her doctors BP has been elevated 158/93 and 164/86. One of the reading was before she was changed to losartan.    Review of Systems + Postnasal dripping, no sinus pain or congestion No chest pain or difficulty breathing No nausea, vomiting, diarrhea. Occasionally she has heartburn  Past Medical History  Diagnosis Date  . Hypertension   . CAD (coronary artery disease)     Dr Gwenlyn Found  . Venous insufficiency   . Hyperlipidemia   . Diabetes mellitus   . GERD (gastroesophageal reflux disease)   . Gastritis   . Hx of colonic polyps   . Malignant neoplasm of small intestine   . Ventral hernia   . DJD (degenerative joint disease)   . Headache(784.0)   . Depression   . History of shingles   . History of anemia   . Vitamin B12 deficiency   . 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 Cabg x 5  1997  . Cesarean section    . Left knee surgery    . Tonsillectomy    . Tubal ligation    . 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 March Rummage  . Lap ventral hernia repair with incarcerated colon  09/2008    Dr. Lucia Gaskins    History   Social History  . Marital Status: Widowed    Spouse Name: N/A  . Number of Children: 2  . Years of Education: N/A   Occupational History  . retired-- [employer]    .     Social History Main Topics  . Smoking status: Never  Smoker   . Smokeless tobacco: Never Used     Comment: NEVER SMOKED  . Alcohol Use: No  . Drug Use: No  . Sexual Activity: Not on file   Other Topics Concern  . Not on file   Social History Narrative   Lives by herself, lost husband ~ 2004        Medication List       This list is accurate as of: 06/08/14  7:13 PM.  Always use your most recent med list.               aspirin 325 MG tablet  Take 325 mg by mouth daily.     atenolol 50 MG tablet  Commonly known as:  TENORMIN  TAKE 1 TABLET BY MOUTH DAILY     atorvastatin 20 MG tablet  Commonly known as:  LIPITOR  Take 1 tablet (20 mg total) by mouth daily.     cholecalciferol 1000 UNITS tablet  Commonly known as:  VITAMIN D  Take 1,000 Units by mouth daily.     clopidogrel 75 MG tablet  Commonly known as:  PLAVIX  TAKE 1 TABLET BY MOUTH DAILY     clorazepate  7.5 MG tablet  Commonly known as:  TRANXENE  Take 1 tablet (7.5 mg total) by mouth as needed.     dicyclomine 20 MG tablet  Commonly known as:  BENTYL  Take 1 tablet (20 mg total) by mouth 3 (three) times daily as needed (for abdominal cramping).     docusate sodium 100 MG capsule  Commonly known as:  COLACE  Take 100 mg by mouth 2 (two) times daily.     glucose blood test strip  Commonly known as:  ONE TOUCH ULTRA TEST  USE AS DIRECTED to check blood sugar 3 times per day dx code E11.65     hydrochlorothiazide 25 MG tablet  Commonly known as:  HYDRODIURIL  Take 1 tablet daily     HYDROcodone-acetaminophen 5-325 MG per tablet  Commonly known as:  NORCO/VICODIN  Take 1 tablet by mouth 3 (three) times daily as needed.     insulin NPH Human 100 UNIT/ML injection  Commonly known as:  HUMULIN N,NOVOLIN N  Inject 34 units into the skin every morning and inject 18 units into the skin at bedtime.     Insulin Pen Needle 32G X 4 MM Misc  Commonly known as:  BD PEN NEEDLE NANO U/F  Use 3 needles per day.     insulin regular 100 units/mL injection    Commonly known as:  NOVOLIN R,HUMULIN R  Inject into the skin 3 (three) times daily before meals. Inject 12 units every morning and evening with meals and inject 14 units with lunch.     INSULIN SYRINGE .5CC/31GX5/16" 31G X 5/16" 0.5 ML Misc  Use 5 syringes per day     Liraglutide 18 MG/3ML Sopn  Commonly known as:  VICTOZA  Inject 1.2 mg daily     losartan 100 MG tablet  Commonly known as:  COZAAR  Take 1 tablet (100 mg total) by mouth daily.     metFORMIN 500 MG tablet  Commonly known as:  GLUCOPHAGE  TAKE 1 TABLET(500 MG) BY MOUTH TWICE DAILY WITH A MEAL     multivitamin capsule  Take 1 capsule by mouth daily.     neomycin-polymyxin-hydrocortisone otic solution  Commonly known as:  CORTISPORIN  2-3 drops in each ear three times daily     niacin 500 MG CR tablet  Commonly known as:  NIASPAN  TAKE 1 TABLET BY MOUTH DAILY     omeprazole 40 MG capsule  Commonly known as:  PRILOSEC  Take 1 capsule (40 mg total) by mouth daily.     ONE TOUCH ULTRA SYSTEM KIT W/DEVICE Kit  1 kit by Does not apply route once.           Objective:   Physical Exam BP 138/78 mmHg  Pulse 76  Temp(Src) 97.6 F (36.4 C) (Oral)  Ht _0  (1.702 m)  Wt 168 lb 4 oz (76.318 kg)  BMI 26.35 kg/m2  SpO2 97%  General:   Well developed, well nourished . NAD.  HEENT:  Normocephalic . Face symmetric, atraumatic Right TM normal, left TM hard to assess due to now broken out. No discharge Lungs:  CTA B Normal respiratory effort, no intercostal retractions, no accessory muscle use. Heart: RRR,  no murmur.  No pretibial edema bilaterally  Skin: Not pale. Not jaundice. Places and the abdomen, arm and leg where she was itching and had a rash looked normal today. Neurologic:  alert & oriented X3.  Speech normal, gait appropriate for age and unassisted Psych--  Cognition and judgment appear intact.  Cooperative with normal attention span and concentration.  Behavior appropriate. No anxious or  depressed appearing.       Assessment & Plan:    Cough, Improving since ACE inhibitors were stopped, recommend the following: Flonase for one month Prilosec for one month If not back to normal she will let me know  Rash: Resolved

## 2014-06-08 NOTE — Assessment & Plan Note (Signed)
Refill pain medication

## 2014-06-08 NOTE — Patient Instructions (Signed)
Get your blood work before you leave   Flonase or Nasacort 2 sprays in each side of the nose daily for a month Prilosec 20 mg OTC: 1 tablet before breakfast every morning for a month If the cough does not continue to gradually improve let me know  Check the  blood pressure 2 or 3 times a  Week  Be sure your blood pressure is between 110/65 and  145/85.  if it is consistently higher or lower, let me know

## 2014-06-08 NOTE — Assessment & Plan Note (Addendum)
Recently switch from ACE inhibitors to losartan (05/27/2014) d/t cough, cough subsiding, BP has been elevated twice recently but today is very good. Plan: Continue current meds Check a BMP Monitor BPs See instructions

## 2014-06-08 NOTE — Progress Notes (Signed)
Pre visit review using our clinic review tool, if applicable. No additional management support is needed unless otherwise documented below in the visit note. 

## 2014-06-20 ENCOUNTER — Other Ambulatory Visit: Payer: Self-pay | Admitting: Internal Medicine

## 2014-06-20 ENCOUNTER — Other Ambulatory Visit: Payer: Self-pay | Admitting: Endocrinology

## 2014-06-25 ENCOUNTER — Other Ambulatory Visit: Payer: Self-pay | Admitting: Pulmonary Disease

## 2014-07-13 ENCOUNTER — Other Ambulatory Visit: Payer: Self-pay | Admitting: Internal Medicine

## 2014-07-16 ENCOUNTER — Ambulatory Visit: Payer: Medicare Other | Admitting: Endocrinology

## 2014-07-22 DIAGNOSIS — E785 Hyperlipidemia, unspecified: Secondary | ICD-10-CM | POA: Diagnosis not present

## 2014-07-23 LAB — LIPID PANEL
CHOL/HDL RATIO: 2.8 ratio
CHOLESTEROL: 126 mg/dL (ref 0–200)
HDL: 45 mg/dL — AB (ref >=46)
LDL Cholesterol: 40 mg/dL (ref 0–99)
Triglycerides: 205 mg/dL — ABNORMAL HIGH (ref <150)
VLDL: 41 mg/dL — AB (ref 0–40)

## 2014-07-27 ENCOUNTER — Encounter: Payer: Self-pay | Admitting: *Deleted

## 2014-07-27 ENCOUNTER — Ambulatory Visit (INDEPENDENT_AMBULATORY_CARE_PROVIDER_SITE_OTHER): Payer: Medicare Other | Admitting: Pharmacist Clinician (PhC)/ Clinical Pharmacy Specialist

## 2014-07-27 ENCOUNTER — Encounter: Payer: Self-pay | Admitting: Pharmacist Clinician (PhC)/ Clinical Pharmacy Specialist

## 2014-07-27 ENCOUNTER — Other Ambulatory Visit: Payer: Self-pay | Admitting: Cardiovascular Disease

## 2014-07-27 VITALS — BP 180/100 | HR 76 | Ht 66.5 in | Wt 169.7 lb

## 2014-07-27 DIAGNOSIS — I10 Essential (primary) hypertension: Secondary | ICD-10-CM

## 2014-07-27 DIAGNOSIS — I2510 Atherosclerotic heart disease of native coronary artery without angina pectoris: Secondary | ICD-10-CM

## 2014-07-27 MED ORDER — IRBESARTAN 300 MG PO TABS: 300.0000 mg | ORAL_TABLET | Freq: Every day | ORAL | Status: DC

## 2014-07-27 NOTE — Assessment & Plan Note (Signed)
Today her BP is actually higher at 180/100.  She states compliance with medications and is not eager to add more, as she already has difficulty with her prescription expenses due to costs of insulin.  Will switch her losartan to irbesartan 300 mg once daily.  Suggested she try and find her home BP cuff and check daily readings between now and her next appointment with me.  Gave detailed directions in print on home BP monitoring.  Will see her back in 3 weeks for follow up.

## 2014-07-27 NOTE — Telephone Encounter (Signed)
Rx(s) sent to pharmacy electronically.  

## 2014-07-27 NOTE — Patient Instructions (Signed)
Return for a a follow up appointment in 3 weeks   Your blood pressure today is 180/100  Check your blood pressure at home daily (if able) and keep record of the readings.  Take your BP meds as follows: stop taking losartan 100 mg and start with irbesartan 300mg  once daily; continue all other medication  Bring all of your meds, your BP cuff and your record of home blood pressures to your next appointment.  Exercise as you're able, try to walk approximately 30 minutes per day.  Keep salt intake to a minimum, especially watch canned and prepared boxed foods.  Eat more fresh fruits and vegetables and fewer canned items.  Avoid eating in fast food restaurants.    HOW TO TAKE YOUR BLOOD PRESSURE: . Rest 5 minutes before taking your blood pressure. .  Don't smoke or drink caffeinated beverages for at least 30 minutes before. . Take your blood pressure before (not after) you eat. . Sit comfortably with your back supported and both feet on the floor (don't cross your legs). . Elevate your arm to heart level on a table or a desk. . Use the proper sized cuff. It should fit smoothly and snugly around your bare upper arm. There should be enough room to slip a fingertip under the cuff. The bottom edge of the cuff should be 1 inch above the crease of the elbow. . Ideally, take 3 measurements at one sitting and record the average.

## 2014-07-27 NOTE — Progress Notes (Signed)
07/27/2014 [place] 1940/01/24 [id number]   HPI:  [name] is a 74 y.o. female patient of Dr Gwenlyn Found, with a [place] below who presents today for hypertension clinic evaluation.  When she saw Dr. Gwenlyn Found in May her BP was elevated somewhat, but she was having problems with a persistent dry hacking cough.  He switched her from ramipril to losartan 100 mg daily.  Pt reports her cough dissipated within 1-2 weeks.  Has been to 2 different MD appointments since then, one with a BP of 158/93, the other 138/72.  Cardiac Hx: MI with CABG x 5 in 1997,   Family Hx: both parents died from [place], dad at 67, mom at 6.    Social Hx: does not use tobacco or alcohol, drinks 2 cans of diet soda (with caffeine) daily  Diet: eats many meals out or take out.  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   Home BP readings: none, thinks she has a cuff at home, hasn't found it   Current antihypertensive medications: losartan 100 mg qd, hctz 25 mg qd, atenolol 50 mg qd   Current Outpatient Prescriptions  Medication Sig Dispense Refill  . aspirin 325 MG tablet Take 325 mg by mouth daily.    Marland Kitchen atenolol (TENORMIN) 50 MG tablet Take 1 tablet (50 mg total) by mouth daily. 90 tablet 2  . atorvastatin (LIPITOR) 20 MG tablet Take 1 tablet (20 mg total) by mouth daily. 30 tablet 6  . Blood Glucose Monitoring Suppl (ONE TOUCH ULTRA SYSTEM KIT) W/DEVICE KIT 1 kit by Does not apply route once. (Patient not taking: Reported on 06/08/2014) 1 each 0  . cholecalciferol (VITAMIN D) 1000 UNITS tablet Take 1,000 Units by mouth daily.      . clopidogrel (PLAVIX) 75 MG tablet Take 1 tablet (75 mg total) by mouth daily. 90 tablet 1  . clorazepate (TRANXENE) 7.5 MG tablet Take 1 tablet (7.5 mg total) by mouth as needed. 30 tablet 5  . dicyclomine (BENTYL) 20 MG tablet Take 1 tablet (20 mg total) by mouth 3 (three) times  daily as needed (for abdominal cramping). 50 tablet 5  . docusate sodium (COLACE) 100 MG capsule Take 100 mg by mouth 2 (two) times daily.    Marland Kitchen glucose blood (ONE TOUCH ULTRA TEST) test strip USE AS DIRECTED to check blood sugar 3 times per day dx code E11.65 (Patient not taking: Reported on 06/08/2014) 100 each 3  . hydrochlorothiazide (HYDRODIURIL) 25 MG tablet Take 1 tablet daily 90 tablet 1  . HYDROcodone-acetaminophen (NORCO/VICODIN) 5-325 MG per tablet Take 1 tablet by mouth 3 (three) times daily as needed. 90 tablet 0  . insulin NPH Human (HUMULIN N,NOVOLIN N) 100 UNIT/ML injection Inject 34 units into the skin every morning and inject 18 units into the skin at bedtime.    . Insulin Pen Needle (BD PEN NEEDLE NANO U/F) 32G X 4 MM MISC Use 3 needles per day. (Patient not taking: Reported on 06/08/2014) 100 each 3  . insulin regular (NOVOLIN R,HUMULIN R) 100 units/mL injection Inject into the skin 3 (three) times daily before meals. Inject 12 units every morning and evening with meals and inject 14 units with lunch.    . Insulin Syringe-Needle U-100 (INSULIN SYRINGE .5CC/31GX5/16") 31G X 5/16" 0.5 ML MISC Use 5 syringes per day (Patient not taking: Reported on 06/08/2014) 150 each 3  .  irbesartan (AVAPRO) 300 MG tablet TAKE 1 TABLET(300 MG) BY MOUTH DAILY 90 tablet 1  . losartan (COZAAR) 100 MG tablet Take 1 tablet (100 mg total) by mouth daily. 30 tablet 6  . metFORMIN (GLUCOPHAGE) 500 MG tablet TAKE 1 TABLET(500 MG) BY MOUTH TWICE DAILY WITH A MEAL 180 tablet 2  . Multiple Vitamin (MULTIVITAMIN) capsule Take 1 capsule by mouth daily.      Marland Kitchen neomycin-polymyxin-hydrocortisone (CORTISPORIN) otic solution 2-3 drops in each ear three times daily 10 mL 2  . niacin (NIASPAN) 500 MG CR tablet TAKE 1 TABLET BY MOUTH DAILY 90 tablet 2  . omeprazole (PRILOSEC) 40 MG capsule Take 1 capsule (40 mg total) by mouth daily. 90 capsule 1  . VICTOZA 18 MG/3ML SOPN INJECT 1.2 MG AS DIRECTED DAILY 6 mL 5   No current  facility-administered medications for this visit.    Allergies  Allergen Reactions  . Codeine     REACTION: makes her nervous  . Ibuprofen     REACTION: nervous  . Meperidine Hcl     REACTION: nasuea and vomitting  . Naproxen Sodium     REACTION: nervous    Past Medical History  Diagnosis Date  . Hypertension   . CAD (coronary artery disease)     Dr Gwenlyn Found  . Venous insufficiency   . Hyperlipidemia   . Diabetes mellitus   . GERD (gastroesophageal reflux disease)   . Gastritis   . Hx of colonic polyps   . Malignant neoplasm of small intestine   . Ventral hernia   . DJD (degenerative joint disease)   . Headache(784.0)   . Depression   . History of shingles   . History of anemia   . Vitamin B12 deficiency   . 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    Tommy Medal PharmD CPP [facility]

## 2014-07-28 ENCOUNTER — Encounter: Payer: Self-pay | Admitting: Gastroenterology

## 2014-08-06 ENCOUNTER — Other Ambulatory Visit: Payer: Self-pay | Admitting: Cardiovascular Disease

## 2014-08-06 NOTE — Telephone Encounter (Signed)
REFILL 

## 2014-08-19 ENCOUNTER — Encounter: Payer: Self-pay | Admitting: Endocrinology

## 2014-08-19 ENCOUNTER — Ambulatory Visit (INDEPENDENT_AMBULATORY_CARE_PROVIDER_SITE_OTHER): Payer: Medicare Other | Admitting: Endocrinology

## 2014-08-19 ENCOUNTER — Other Ambulatory Visit (INDEPENDENT_AMBULATORY_CARE_PROVIDER_SITE_OTHER): Payer: Medicare Other | Admitting: *Deleted

## 2014-08-19 VITALS — BP 120/68 | HR 77 | Temp 97.8°F | Resp 12 | Wt 168.6 lb

## 2014-08-19 DIAGNOSIS — E1165 Type 2 diabetes mellitus with hyperglycemia: Secondary | ICD-10-CM

## 2014-08-19 DIAGNOSIS — E1310 Other specified diabetes mellitus with ketoacidosis without coma: Secondary | ICD-10-CM

## 2014-08-19 DIAGNOSIS — I2510 Atherosclerotic heart disease of native coronary artery without angina pectoris: Secondary | ICD-10-CM | POA: Diagnosis not present

## 2014-08-19 DIAGNOSIS — IMO0002 Reserved for concepts with insufficient information to code with codable children: Secondary | ICD-10-CM

## 2014-08-19 DIAGNOSIS — E1110 Type 2 diabetes mellitus with ketoacidosis without coma: Secondary | ICD-10-CM

## 2014-08-19 LAB — POCT GLYCOSYLATED HEMOGLOBIN (HGB A1C): Hemoglobin A1C: 7.2

## 2014-08-19 NOTE — Progress Notes (Signed)
Patient ID: [name], female   DOB: [birth year], 74 y.o.   MRN: [id number]            Reason for Appointment: Followup for Type 2 Diabetes  Referring physician: Larose Kells  History of Present Illness:          Diagnosis: Type 2 diabetes mellitus, date of diagnosis: 1998       Past history:   She was treated with metformin at diagnosis when this had been continued until 2015 Subsequently Amaryl was also added several years ago and this has been continued Her blood sugars were under fair control between 2010 and early 2013 with A1c ranging from 7.4-9.4, mostly under 8% However since 08/2011 her A1c has been mostly over 8%  Insulin was added in 2014 with small doses of Lantus and this has been progressively increased She was started on mealtime insulin on her initial consultation in 9/15 because of high postprandial readings, sometimes over 300 With adding Victoza in 02/2014 her blood sugars were somewhat better with A1c coming down below 8%  Recent history:   INSULIN regimen: Regular 14--16 units before meals.  NPH 38 units in the morning and 20at night Because of the cost of her Lantus insulin in April and this was changed to NPH insulin twice a day  Her blood sugars are finally improving with gradually increasing her insulin doses and continuing Victoza A1c is now down to 7.2  Current blood sugar patterns and problems identified: Fasting blood sugars: These are still mostly high but somewhat inconsistent and appear to be relatively higher for no apparent reason; may be relatively higher early in the morning Postprandial readings: she still has not checked blood sugars after lunch and only occasionally after breakfast; these appear to be fairly good but not consistently.  Her regular insulin was increased at suppertime 1 last visit as many readings after meals. She is not checking readings usually before or after lunch  At sugars around supper time are relatively good but not  consistent Blood sugars after her evening meal are variable but relatively better than before      Oral hypoglycemic drugs the patient is taking are:  Metformin 500 bid    Side effects from medications have been: None Compliance with the medical regimen: good, she is taking her insulin consistently  Hypoglycemia: None  Glucose monitoring:  done 1-2 time a day         Glucometer: One Touch.      Blood Glucose readings by time of day from download:   Mean values apply above for all meters except median for One Touch  PRE-MEAL Fasting Lunch Dinner Bedtime Overall  Glucose range:  117-198    90-203   121-219    Mean/median:  165     172  163   Self-care: The diet that the patient has been following is: none, usually eating low fat meals Meals: 2-3 meals per day at irregular times. Dinner 6-7 pm Breakfast is a toast and sausage but sometimes oatmeal at 10 am, usually eating sandwiches for meals, snacks will be with crackers         Dietician visit, most recent: never.  She saw the CDE in 02/2014              Exercise: yardwork, is more active recently  Weight history: 150-170 previously  Wt Readings from Last 3 Encounters:  08/20/14 166 lb 11.2 oz (75.615 kg)  08/19/14 168 lb 9.6 oz (76.476  kg)  07/27/14 169 lb 11.2 oz (76.975 kg)    Glycemic control:   Lab Results  Component Value Date   HGBA1C 7.2 08/19/2014   HGBA1C 7.7* 04/16/2014   HGBA1C 8.4* 02/05/2014   Lab Results  Component Value Date   MICROALBUR 8.4* 11/06/2013   LDLCALC 40 07/22/2014   CREATININE 1.30* 06/08/2014        Medication List       This list is accurate as of: 08/19/14 11:59 PM.  Always use your most recent med list.               aspirin 325 MG tablet  Take 325 mg by mouth daily.     atenolol 50 MG tablet  Commonly known as:  TENORMIN  Take 1 tablet (50 mg total) by mouth daily.     atorvastatin 20 MG tablet  Commonly known as:  LIPITOR  TAKE 1 TABLET(20 MG) BY MOUTH DAILY      cholecalciferol 1000 UNITS tablet  Commonly known as:  VITAMIN D  Take 1,000 Units by mouth daily.     clopidogrel 75 MG tablet  Commonly known as:  PLAVIX  Take 1 tablet (75 mg total) by mouth daily.     clorazepate 7.5 MG tablet  Commonly known as:  TRANXENE  Take 1 tablet (7.5 mg total) by mouth as needed.     dicyclomine 20 MG tablet  Commonly known as:  BENTYL  Take 1 tablet (20 mg total) by mouth 3 (three) times daily as needed (for abdominal cramping).     docusate sodium 100 MG capsule  Commonly known as:  COLACE  Take 100 mg by mouth 2 (two) times daily.     glucose blood test strip  Commonly known as:  ONE TOUCH ULTRA TEST  USE AS DIRECTED to check blood sugar 3 times per day dx code E11.65     hydrochlorothiazide 25 MG tablet  Commonly known as:  HYDRODIURIL  Take 1 tablet daily     HYDROcodone-acetaminophen 5-325 MG per tablet  Commonly known as:  NORCO/VICODIN  Take 1 tablet by mouth 3 (three) times daily as needed.     insulin NPH Human 100 UNIT/ML injection  Commonly known as:  HUMULIN N,NOVOLIN N  Inject 34 units into the skin every morning and inject 18 units into the skin at bedtime.     Insulin Pen Needle 32G X 4 MM Misc  Commonly known as:  BD PEN NEEDLE NANO U/F  Use 3 needles per day.     insulin regular 100 units/mL injection  Commonly known as:  NOVOLIN R,HUMULIN R  Inject into the skin 3 (three) times daily before meals. Inject 12 units every morning and evening with meals and inject 14 units with lunch.     INSULIN SYRINGE .5CC/31GX5/16" 31G X 5/16" 0.5 ML Misc  Use 5 syringes per day     irbesartan 300 MG tablet  Commonly known as:  AVAPRO  TAKE 1 TABLET(300 MG) BY MOUTH DAILY     losartan 100 MG tablet  Commonly known as:  COZAAR  Take 1 tablet (100 mg total) by mouth daily.     metFORMIN 500 MG tablet  Commonly known as:  GLUCOPHAGE  TAKE 1 TABLET(500 MG) BY MOUTH TWICE DAILY WITH A MEAL     multivitamin capsule  Take 1 capsule  by mouth daily.     neomycin-polymyxin-hydrocortisone otic solution  Commonly known as:  CORTISPORIN  2-3 drops in each  ear three times daily     niacin 500 MG CR tablet  Commonly known as:  NIASPAN  TAKE 1 TABLET BY MOUTH DAILY     omeprazole 40 MG capsule  Commonly known as:  PRILOSEC  Take 1 capsule (40 mg total) by mouth daily.     ONE TOUCH ULTRA SYSTEM KIT W/DEVICE Kit  1 kit by Does not apply route once.     VICTOZA 18 MG/3ML Sopn  Generic drug:  Liraglutide  INJECT 1.2 MG AS DIRECTED DAILY        Allergies:  Allergies  Allergen Reactions  . Codeine     REACTION: makes her nervous  . Ibuprofen     REACTION: nervous  . Meperidine Hcl     REACTION: nasuea and vomitting  . Naproxen Sodium     REACTION: nervous    Past Medical History  Diagnosis Date  . Hypertension   . CAD (coronary artery disease)     Dr Gwenlyn Found  . Venous insufficiency   . Hyperlipidemia   . Diabetes mellitus   . GERD (gastroesophageal reflux disease)   . Gastritis   . Hx of colonic polyps   . Malignant neoplasm of small intestine   . Ventral hernia   . DJD (degenerative joint disease)   . Headache(784.0)   . Depression   . History of shingles   . History of anemia   . Vitamin B12 deficiency   . 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 Cabg x 5  1997  . Cesarean section    . Left knee surgery    . Tonsillectomy    . Tubal ligation    . 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 March Rummage  . Lap ventral hernia repair with incarcerated colon  09/2008    Dr. Lucia Gaskins    Family History  Problem Relation Age of Onset  . Heart disease Mother   . Heart disease Father   . Hypertension Child     Social History:  reports that she has never smoked. She has never used smokeless tobacco. She reports that she does not drink alcohol or use illicit drugs.    Review of Systems        Lipids: Has had excellent control of  hypercholesterolemia with Crestor which she has taken for several years, on lipid-lowering therapy since her coronary bypass surgery.         Lab Results  Component Value Date   CHOL 126 07/22/2014   HDL 45* 07/22/2014   LDLCALC 40 07/22/2014   LDLDIRECT 43.1 05/08/2012   TRIG 205* 07/22/2014   CHOLHDL 2.8 07/22/2014                  The blood pressure has been  well controlled recently and she on 25 mg HCTZ, Losartan, and atenolol.   Is going to be followed by cardiologist for this  No hypokalemia  Lab Results  Component Value Date   CREATININE 1.30* 06/08/2014   BUN 25* 06/08/2014   NA 138 06/08/2014   K 3.9 06/08/2014   CL 105 06/08/2014   CO2 25 06/08/2014   Mild CKD: Stable   Lab Results  Component Value Date   CREATININE 1.30* 06/08/2014   CREATININE 1.38* 04/16/2014   CREATININE 1.33* 02/25/2014       No history of Numbness, tingling or burning in feet but occasional tingling in her  left leg which has had severe injuries in the past Diabetic foot exam in 9/15 showed:  Decreased monofilament in toes and plantar surfaces, skin exam is normal. Pedal pulses: absent left DP  She is asking about itching and rash on her extremities again, has relatively large spots and does not get any pus in them.  Has not discussed with PCP  Physical Examination:  BP 120/68 mmHg  Pulse 77  Temp(Src) 97.8 F (36.6 C) (Oral)  Resp 12  Wt 168 lb 9.6 oz (76.476 kg)  SpO2 95%  She has scattered maculopapular noninfected lesions on her left lower leg        ASSESSMENT:  Diabetes type 2, uncontrolled with BMI 27   See history of present illness for detailed discussion of her current blood sugar patterns, management and problems identified  She has been getting better blood sugar control now with gradually increasing her NPH and regular insulin  She is also taking Victoza For some reason she has not been given more than 1000 mg a day of metformin even though her renal function is  relatively good  She does fairly well with compliance and is not concerned about the cost of NPH and Regular Insulin as well as multiple injections Previously had been reluctant to try the V-go pump because of the cost  Currently her highest blood sugars are in the morning before breakfast  HYPERTENSION: This appears to be not consistently controlled, to be followed by cardiologist    PLAN:  Increase metformin to 1000 mg in the evening More consistent glucose monitoring after breakfast Consider increasing NPH of fasting readings continue to be high  She will follow-up with her PCP for various issues such as  skin rash,blood pressure control  Patient Instructions  Metformin 1 am and 2 pm  Check blood sugars on waking up .Marland Kitchen3-4  .Marland Kitchen times a week Also check blood sugars about 2 hours after a meal and do this after different meals by rotation  Recommended blood sugar levels on waking up is 90-130 and about 2 hours after meal is 140-180 Please bring blood sugar monitor to each visit.      Davione Lenker 08/20/2014, 12:38 PM   Note: This office note was prepared with Dragon voice recognition system technology. Any transcriptional errors that result from this process are unintentional.

## 2014-08-19 NOTE — Patient Instructions (Signed)
Metformin 1 am and 2 pm  Check blood sugars on waking up .Marland Kitchen3-4  .Marland Kitchen times a week Also check blood sugars about 2 hours after a meal and do this after different meals by rotation  Recommended blood sugar levels on waking up is 90-130 and about 2 hours after meal is 140-180 Please bring blood sugar monitor to each visit.

## 2014-08-20 ENCOUNTER — Ambulatory Visit (INDEPENDENT_AMBULATORY_CARE_PROVIDER_SITE_OTHER): Payer: Medicare Other | Admitting: Pharmacist Clinician (PhC)/ Clinical Pharmacy Specialist

## 2014-08-20 ENCOUNTER — Encounter: Payer: Self-pay | Admitting: Pharmacist Clinician (PhC)/ Clinical Pharmacy Specialist

## 2014-08-20 ENCOUNTER — Telehealth: Payer: Self-pay | Admitting: Internal Medicine

## 2014-08-20 VITALS — BP 106/62 | HR 72 | Ht 66.5 in | Wt 166.7 lb

## 2014-08-20 DIAGNOSIS — I2510 Atherosclerotic heart disease of native coronary artery without angina pectoris: Secondary | ICD-10-CM

## 2014-08-20 DIAGNOSIS — I10 Essential (primary) hypertension: Secondary | ICD-10-CM

## 2014-08-20 NOTE — Assessment & Plan Note (Signed)
Her blood pressure is much improved after switching to irbesartan.  She feels no dizziness, lightheadedness or weakness from the lower BP readings.  I have encouraged her to continue checking her BP at home 3-4 times each week, and let us know if the numbers start trending <100 regularly or >140.  Otherwise she will continue on the current regimen.  Her home BP cuff read within 10 points of the office reading, her home cuff actually lower.

## 2014-08-20 NOTE — Progress Notes (Signed)
08/20/2014 [place] 29-Nov-1940 [id number]   HPI:  [name] is a 74 y.o. female patient of Dr Gwenlyn Found, with a PMH below who presents today for hypertension clinic follow up.  When she saw Dr. Gwenlyn Found in May her BP was elevated somewhat, but she was having problems with a persistent dry hacking cough.  He switched her from ramipril to losartan 100 mg daily.  While the cough disappeared, her BP increased, as was 180/100 at her last visit with me.  We increased the irbesartan to 300 mg daily and she returns today for follow uo..  Cardiac Hx: MI with CABG x 5 in 1997,   Family Hx: both parents died from [place], dad at 47, mom at 74.    Social Hx: does not use tobacco or alcohol, drinks 2 cans of diet soda (with caffeine) daily  Diet: eats many meals out or take out.  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   Home BP readings: [employer] machine with large cuff (her arm is within the parameters of the cuff, but at the very small end), readings from 97-137/60-79.  Average of the 9 readings is 116/67  Current antihypertensive medications: irbesartan 300 mg qd, hctz 25 mg qd, atenolol 50 mg qd   Current Outpatient Prescriptions  Medication Sig Dispense Refill  . aspirin 325 MG tablet Take 325 mg by mouth daily.    Marland Kitchen atenolol (TENORMIN) 50 MG tablet Take 1 tablet (50 mg total) by mouth daily. 90 tablet 2  . atorvastatin (LIPITOR) 20 MG tablet TAKE 1 TABLET(20 MG) BY MOUTH DAILY 30 tablet 6  . Blood Glucose Monitoring Suppl (ONE TOUCH ULTRA SYSTEM KIT) W/DEVICE KIT 1 kit by Does not apply route once. 1 each 0  . cholecalciferol (VITAMIN D) 1000 UNITS tablet Take 1,000 Units by mouth daily.      . clopidogrel (PLAVIX) 75 MG tablet Take 1 tablet (75 mg total) by mouth daily. 90 tablet 1  . clorazepate (TRANXENE) 7.5 MG tablet Take 1 tablet (7.5 mg total) by mouth as needed. 30  tablet 5  . dicyclomine (BENTYL) 20 MG tablet Take 1 tablet (20 mg total) by mouth 3 (three) times daily as needed (for abdominal cramping). 50 tablet 5  . docusate sodium (COLACE) 100 MG capsule Take 100 mg by mouth 2 (two) times daily.    Marland Kitchen glucose blood (ONE TOUCH ULTRA TEST) test strip USE AS DIRECTED to check blood sugar 3 times per day dx code E11.65 100 each 3  . hydrochlorothiazide (HYDRODIURIL) 25 MG tablet Take 1 tablet daily 90 tablet 1  . HYDROcodone-acetaminophen (NORCO/VICODIN) 5-325 MG per tablet Take 1 tablet by mouth 3 (three) times daily as needed. 90 tablet 0  . insulin NPH Human (HUMULIN N,NOVOLIN N) 100 UNIT/ML injection Inject 34 units into the skin every morning and inject 18 units into the skin at bedtime.    . Insulin Pen Needle (BD PEN NEEDLE NANO U/F) 32G X 4 MM MISC Use 3 needles per day. 100 each 3  . insulin regular (NOVOLIN R,HUMULIN R) 100 units/mL injection Inject into the skin 3 (three) times daily before meals. Inject 12 units every morning and evening with meals and inject 14 units with lunch.    . Insulin Syringe-Needle U-100 (INSULIN SYRINGE .5CC/31GX5/16") 31G X 5/16" 0.5 ML MISC Use 5 syringes per day 150 each 3  .  irbesartan (AVAPRO) 300 MG tablet TAKE 1 TABLET(300 MG) BY MOUTH DAILY 90 tablet 1  . losartan (COZAAR) 100 MG tablet Take 1 tablet (100 mg total) by mouth daily. 30 tablet 6  . metFORMIN (GLUCOPHAGE) 500 MG tablet TAKE 1 TABLET(500 MG) BY MOUTH TWICE DAILY WITH A MEAL 180 tablet 2  . Multiple Vitamin (MULTIVITAMIN) capsule Take 1 capsule by mouth daily.      Marland Kitchen neomycin-polymyxin-hydrocortisone (CORTISPORIN) otic solution 2-3 drops in each ear three times daily 10 mL 2  . niacin (NIASPAN) 500 MG CR tablet TAKE 1 TABLET BY MOUTH DAILY 90 tablet 2  . omeprazole (PRILOSEC) 40 MG capsule Take 1 capsule (40 mg total) by mouth daily. 90 capsule 1  . VICTOZA 18 MG/3ML SOPN INJECT 1.2 MG AS DIRECTED DAILY 6 mL 5   No current facility-administered  medications for this visit.    Allergies  Allergen Reactions  . Codeine     REACTION: makes her nervous  . Ibuprofen     REACTION: nervous  . Meperidine Hcl     REACTION: nasuea and vomitting  . Naproxen Sodium     REACTION: nervous    Past Medical History  Diagnosis Date  . Hypertension   . CAD (coronary artery disease)     Dr Gwenlyn Found  . Venous insufficiency   . Hyperlipidemia   . Diabetes mellitus   . GERD (gastroesophageal reflux disease)   . Gastritis   . Hx of colonic polyps   . Malignant neoplasm of small intestine   . Ventral hernia   . DJD (degenerative joint disease)   . Headache(784.0)   . Depression   . History of shingles   . History of anemia   . Vitamin B12 deficiency   . 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    Tommy Medal PharmD CPP [facility]

## 2014-08-20 NOTE — Telephone Encounter (Signed)
Pre visit letter mailed 08/07/14

## 2014-08-20 NOTE — Patient Instructions (Addendum)
Your blood pressure today is 106/62  (goal <150/90)  Check your blood pressure at home 3-4 times each week and keep record of the readings.  Take your BP meds as follows: continue all of your current medications  Bring all of your meds, your BP cuff and your record of home blood pressures to your next appointment.  Exercise as you're able, try to walk approximately 30 minutes per day.  Keep salt intake to a minimum, especially watch canned and prepared boxed foods.  Eat more fresh fruits and vegetables and fewer canned items.  Avoid eating in fast food restaurants.    HOW TO TAKE YOUR BLOOD PRESSURE: . Rest 5 minutes before taking your blood pressure. .  Don't smoke or drink caffeinated beverages for at least 30 minutes before. . Take your blood pressure before (not after) you eat. . Sit comfortably with your back supported and both feet on the floor (don't cross your legs). . Elevate your arm to heart level on a table or a desk. . Use the proper sized cuff. It should fit smoothly and snugly around your bare upper arm. There should be enough room to slip a fingertip under the cuff. The bottom edge of the cuff should be 1 inch above the crease of the elbow. . Ideally, take 3 measurements at one sitting and record the average.

## 2014-08-26 ENCOUNTER — Telehealth: Payer: Self-pay

## 2014-08-26 ENCOUNTER — Other Ambulatory Visit (HOSPITAL_BASED_OUTPATIENT_CLINIC_OR_DEPARTMENT_OTHER): Payer: Medicare Other

## 2014-08-26 ENCOUNTER — Encounter: Payer: Self-pay | Admitting: Hematology & Oncology

## 2014-08-26 ENCOUNTER — Ambulatory Visit (HOSPITAL_BASED_OUTPATIENT_CLINIC_OR_DEPARTMENT_OTHER): Payer: Medicare Other | Admitting: Hematology & Oncology

## 2014-08-26 VITALS — BP 144/68 | HR 74 | Temp 97.4°F | Resp 18 | Ht 66.0 in | Wt 169.0 lb

## 2014-08-26 DIAGNOSIS — C9002 Multiple myeloma in relapse: Secondary | ICD-10-CM

## 2014-08-26 DIAGNOSIS — C9110 Chronic lymphocytic leukemia of B-cell type not having achieved remission: Secondary | ICD-10-CM | POA: Diagnosis not present

## 2014-08-26 DIAGNOSIS — Z85038 Personal history of other malignant neoplasm of large intestine: Secondary | ICD-10-CM

## 2014-08-26 LAB — CBC WITH DIFFERENTIAL (CANCER CENTER ONLY)
BASO#: 0.1 10*3/uL (ref 0.0–0.2)
BASO%: 0.3 % (ref 0.0–2.0)
EOS%: 1.4 % (ref 0.0–7.0)
Eosinophils Absolute: 0.3 10*3/uL (ref 0.0–0.5)
HCT: 35.1 % (ref 34.8–46.6)
HGB: 11.9 g/dL (ref 11.6–15.9)
LYMPH#: 13.6 10*3/uL — ABNORMAL HIGH (ref 0.9–3.3)
LYMPH%: 72.5 % — AB (ref 14.0–48.0)
MCH: 32.1 pg (ref 26.0–34.0)
MCHC: 33.9 g/dL (ref 32.0–36.0)
MCV: 95 fL (ref 81–101)
MONO#: 1.2 10*3/uL — ABNORMAL HIGH (ref 0.1–0.9)
MONO%: 6.1 % (ref 0.0–13.0)
NEUT#: 3.7 10*3/uL (ref 1.5–6.5)
NEUT%: 19.7 % — AB (ref 39.6–80.0)
Platelets: 190 10*3/uL (ref 145–400)
RBC: 3.71 10*6/uL (ref 3.70–5.32)
RDW: 13.4 % (ref 11.1–15.7)
WBC: 18.7 10*3/uL — ABNORMAL HIGH (ref 3.9–10.0)

## 2014-08-26 LAB — CHCC SATELLITE - SMEAR

## 2014-08-26 NOTE — Telephone Encounter (Signed)
LMOVM

## 2014-08-26 NOTE — Progress Notes (Signed)
Hematology and Oncology Follow Up Visit  [place] [id number] [birth year] 74 y.o. 08/26/2014   Principle Diagnosis:   CLL -stage A  Remote history of colon cancer  Current Therapy:    Observation     Interim History:  [name] is back for follow-up. She is doing okay. She's gained a little bit of weight. She try to watch out for her blood sugars.  She's had some sores on her legs. These are macular 5 areas. They seem to heal up slowly. I told her to try some cortisone cream. She has some at home.  She's had no problems with fever. There's been no bleeding. She is on Plavix. She's not noted any bruising.  There's been no change in bowel or bladder habits.  She's not noted any swollen lymph glands.  Overall, her performance status is ECOG 1. Medications:  Current outpatient prescriptions:  .  aspirin 325 MG tablet, Take 325 mg by mouth daily., Disp: , Rfl:  .  atenolol (TENORMIN) 50 MG tablet, Take 1 tablet (50 mg total) by mouth daily., Disp: 90 tablet, Rfl: 2 .  atorvastatin (LIPITOR) 20 MG tablet, TAKE 1 TABLET(20 MG) BY MOUTH DAILY, Disp: 30 tablet, Rfl: 6 .  Blood Glucose Monitoring Suppl (ONE TOUCH ULTRA SYSTEM KIT) W/DEVICE KIT, 1 kit by Does not apply route once., Disp: 1 each, Rfl: 0 .  cholecalciferol (VITAMIN D) 1000 UNITS tablet, Take 1,000 Units by mouth daily.  , Disp: , Rfl:  .  clopidogrel (PLAVIX) 75 MG tablet, Take 1 tablet (75 mg total) by mouth daily., Disp: 90 tablet, Rfl: 1 .  clorazepate (TRANXENE) 7.5 MG tablet, Take 1 tablet (7.5 mg total) by mouth as needed., Disp: 30 tablet, Rfl: 5 .  dicyclomine (BENTYL) 20 MG tablet, Take 1 tablet (20 mg total) by mouth 3 (three) times daily as needed (for abdominal cramping)., Disp: 50 tablet, Rfl: 5 .  docusate sodium (COLACE) 100 MG capsule, Take 100 mg by mouth 2 (two) times daily., Disp: , Rfl:  .  glucose blood (ONE TOUCH ULTRA TEST) test strip, USE AS DIRECTED to check blood sugar 3 times per day dx  code E11.65, Disp: 100 each, Rfl: 3 .  hydrochlorothiazide (HYDRODIURIL) 25 MG tablet, Take 1 tablet daily, Disp: 90 tablet, Rfl: 1 .  HYDROcodone-acetaminophen (NORCO/VICODIN) 5-325 MG per tablet, Take 1 tablet by mouth 3 (three) times daily as needed., Disp: 90 tablet, Rfl: 0 .  insulin NPH Human (HUMULIN N,NOVOLIN N) 100 UNIT/ML injection, Inject 34 units into the skin every morning and inject 18 units into the skin at bedtime., Disp: , Rfl:  .  Insulin Pen Needle (BD PEN NEEDLE NANO U/F) 32G X 4 MM MISC, Use 3 needles per day., Disp: 100 each, Rfl: 3 .  insulin regular (NOVOLIN R,HUMULIN R) 100 units/mL injection, Inject into the skin 3 (three) times daily before meals. Inject 12 units every morning and evening with meals and inject 14 units with lunch., Disp: , Rfl:  .  Insulin Syringe-Needle U-100 (INSULIN SYRINGE .5CC/31GX5/16") 31G X 5/16" 0.5 ML MISC, Use 5 syringes per day, Disp: 150 each, Rfl: 3 .  irbesartan (AVAPRO) 300 MG tablet, TAKE 1 TABLET(300 MG) BY MOUTH DAILY, Disp: 90 tablet, Rfl: 1 .  metFORMIN (GLUCOPHAGE) 500 MG tablet, TAKE 1 TABLET(500 MG) BY MOUTH TWICE DAILY WITH A MEAL (Patient taking differently: TAKE 2 TABLETS AT BEDTIME), Disp: 180 tablet, Rfl: 2 .  Multiple Vitamin (MULTIVITAMIN) capsule, Take 1 capsule by  mouth daily.  , Disp: , Rfl:  .  neomycin-polymyxin-hydrocortisone (CORTISPORIN) otic solution, 2-3 drops in each ear three times daily, Disp: 10 mL, Rfl: 2 .  niacin (NIASPAN) 500 MG CR tablet, TAKE 1 TABLET BY MOUTH DAILY, Disp: 90 tablet, Rfl: 2 .  omeprazole (PRILOSEC) 40 MG capsule, Take 1 capsule (40 mg total) by mouth daily., Disp: 90 capsule, Rfl: 1 .  VICTOZA 18 MG/3ML SOPN, INJECT 1.2 MG AS DIRECTED DAILY, Disp: 6 mL, Rfl: 5  Allergies:  Allergies  Allergen Reactions  . Codeine     REACTION: makes her nervous  . Ibuprofen     REACTION: nervous  . Meperidine Hcl     REACTION: nasuea and vomitting  . Naproxen Sodium     REACTION: nervous    Past  Medical History, Surgical history, Social history, and Family History were reviewed and updated.  Review of Systems: As above  Physical Exam:  height is _0  (1.676 m) and weight is 169 lb (76.658 kg). Her oral temperature is 97.4 F (36.3 C). Her blood pressure is 144/68 and her pulse is 74. Her respiration is 18.   Well-developed well-nourished white female. Head and neck exam shows no ocular or oral lesions. She has no palpable cervical or supraclavicular lymph nodes. Lungs are clear. Cardiac exam regular rate and rhythm with no murmurs, rubs or bruits. Abdomen is soft. She has good bowel sounds. There is no fluid wave. There is no palpable liver or spleen tip. She has well-healed laparotomy scar. Extremities shows no clubbing, cyanosis or edema. Skin exam no rashes, ecchymoses or petechia. She has some healing macular type lesions on her left thigh. Neurological exam is nonfocal.  Lab Results  Component Value Date   WBC 18.7* 08/26/2014   HGB 11.9 08/26/2014   HCT 35.1 08/26/2014   MCV 95 08/26/2014   PLT 190 08/26/2014     Chemistry      Component Value Date/Time   NA 138 06/08/2014 0942   NA 141 10/31/2013 1041   K 3.9 06/08/2014 0942   K 4.1 10/31/2013 1041   CL 105 06/08/2014 0942   CL 101 10/31/2013 1041   CO2 25 06/08/2014 0942   CO2 26 10/31/2013 1041   BUN 25* 06/08/2014 0942   BUN 20 10/31/2013 1041   CREATININE 1.30* 06/08/2014 0942   CREATININE 1.1 10/31/2013 1041      Component Value Date/Time   CALCIUM 10.2 06/08/2014 0942   CALCIUM 9.9 10/31/2013 1041   ALKPHOS 87 02/25/2014 1003   ALKPHOS 73 10/31/2013 1041   AST 19 02/25/2014 1003   AST 27 10/31/2013 1041   ALT 17 02/25/2014 1003   ALT 27 10/31/2013 1041   BILITOT 0.4 02/25/2014 1003   BILITOT 0.60 10/31/2013 1041         Impression and Plan: [name] is 74 year old white female with CLL. She has stage A disease. She is asymptomatic. There is no indication that we have to treat her. She is  not anemic or thrombocytopenic.  I think we probably get her back in 6 months. Her white cell count is up a little bit. This has been going up slowly. We will have to be careful with this.  She is on quite a few medications. She has to be careful taking all these medicines.   I don't see that would need any blood work in between visits.   [name], MD 8/24/201611:23 AM

## 2014-08-26 NOTE — Telephone Encounter (Signed)
Pre visit call completed 

## 2014-08-28 ENCOUNTER — Ambulatory Visit (INDEPENDENT_AMBULATORY_CARE_PROVIDER_SITE_OTHER): Payer: Medicare Other | Admitting: Internal Medicine

## 2014-08-28 ENCOUNTER — Encounter: Payer: Self-pay | Admitting: Internal Medicine

## 2014-08-28 VITALS — BP 118/76 | HR 73 | Temp 97.6°F | Ht 67.0 in | Wt 170.1 lb

## 2014-08-28 DIAGNOSIS — Z0000 Encounter for general adult medical examination without abnormal findings: Secondary | ICD-10-CM

## 2014-08-28 DIAGNOSIS — I2510 Atherosclerotic heart disease of native coronary artery without angina pectoris: Secondary | ICD-10-CM

## 2014-08-28 DIAGNOSIS — M150 Primary generalized (osteo)arthritis: Secondary | ICD-10-CM

## 2014-08-28 DIAGNOSIS — N959 Unspecified menopausal and perimenopausal disorder: Secondary | ICD-10-CM

## 2014-08-28 DIAGNOSIS — E785 Hyperlipidemia, unspecified: Secondary | ICD-10-CM | POA: Diagnosis not present

## 2014-08-28 DIAGNOSIS — Z1231 Encounter for screening mammogram for malignant neoplasm of breast: Secondary | ICD-10-CM

## 2014-08-28 DIAGNOSIS — E539 Vitamin B deficiency, unspecified: Secondary | ICD-10-CM | POA: Diagnosis not present

## 2014-08-28 DIAGNOSIS — I10 Essential (primary) hypertension: Secondary | ICD-10-CM | POA: Diagnosis not present

## 2014-08-28 DIAGNOSIS — M720 Palmar fascial fibromatosis [Dupuytren]: Secondary | ICD-10-CM

## 2014-08-28 DIAGNOSIS — N63 Unspecified lump in unspecified breast: Secondary | ICD-10-CM

## 2014-08-28 DIAGNOSIS — M159 Polyosteoarthritis, unspecified: Secondary | ICD-10-CM

## 2014-08-28 DIAGNOSIS — Z79891 Long term (current) use of opiate analgesic: Secondary | ICD-10-CM | POA: Diagnosis not present

## 2014-08-28 MED ORDER — HYDROCODONE-ACETAMINOPHEN 5-325 MG PO TABS: 1.0000 | ORAL_TABLET | Freq: Three times a day (TID) | ORAL | Status: DC | PRN

## 2014-08-28 NOTE — Patient Instructions (Addendum)
Continue taking the same medications as you are doing  Next visit in 6 months, fasting for a routine office visit     Prevention of falls. OUTDOORS  Repair cracks and edges of walkways and driveways.  Remove high doorway thresholds.  Trim shrubbery on the main path into your home.  Have good outside lighting.  Clear walkways of tools, rocks, debris, and clutter.  Check that handrails are not broken and are securely fastened. Both sides of steps should have handrails.  Have leaves, snow, and ice cleared regularly.  Use sand or salt on walkways during winter months.  In the garage, clean up grease or oil spills. BATHROOM  Install night lights.  Install grab bars by the toilet and in the tub and shower.  Use non-skid mats or decals in the tub or shower.  Place a plastic non-slip stool in the shower to sit on, if needed.  Keep floors dry and clean up all water on the floor immediately.  Remove soap buildup in the tub or shower on a regular basis.  Secure bath mats with non-slip, double-sided rug tape.  Remove throw rugs and tripping hazards from the floors. BEDROOMS  Install night lights.  Make sure a bedside light is easy to reach.  Do not use oversized bedding.  Keep a telephone by your bedside.  Have a firm chair with side arms to use for getting dressed.  Remove throw rugs and tripping hazards from the floor. KITCHEN  Keep handles on pots and pans turned toward the center of the stove. Use back burners when possible.  Clean up spills quickly and allow time for drying.  Avoid walking on wet floors.  Avoid hot utensils and knives.  Position shelves so they are not too high or low.  Place commonly used objects within easy reach.  If necessary, use a sturdy step stool with a grab bar when reaching.  Keep electrical cables out of the way.  Do not use floor polish or wax that makes floors slippery. If you must use wax, use non-skid floor  wax.  Remove throw rugs and tripping hazards from the floor. STAIRWAYS  Never leave objects on stairs.  Place handrails on both sides of stairways and use them. Fix any loose handrails. Make sure handrails on both sides of the stairways are as long as the stairs.  Check carpeting to make sure it is firmly attached along stairs. Make repairs to worn or loose carpet promptly.  Avoid placing throw rugs at the top or bottom of stairways, or properly secure the rug with carpet tape to prevent slippage. Get rid of throw rugs, if possible.  Have an electrician put in a light switch at the top and bottom of the stairs. OTHER FALL PREVENTION TIPS  Wear low-heel or rubber-soled shoes that are supportive and fit well. Wear closed toe shoes.  When using a stepladder, make sure it is fully opened and both spreaders are firmly locked. Do not climb a closed stepladder.  Add color or contrast paint or tape to grab bars and handrails in your home. Place contrasting color strips on first and last steps.  Learn and use mobility aids as needed. Install an electrical emergency response system.  Turn on lights to avoid dark areas. Replace light bulbs that burn out immediately. Get light switches that glow.  Arrange furniture to create clear pathways. Keep furniture in the same place.  Firmly attach carpet with non-skid or double-sided tape.  Eliminate uneven floor surfaces.  Select a carpet pattern that does not visually hide the edge of steps.  Be aware of all pets. OTHER HOME SAFETY TIPS  Set the water temperature for 120 F (48.8 C).  Keep emergency numbers on or near the telephone.  Keep smoke detectors on every level of the home and near sleeping areas. Document Released: 12/09/2001 Document Revised: 06/20/2011 Document Reviewed: 03/10/2011 [facility] Patient Information 2015 [place], [place]. This information is not intended to replace advice given to you by your health care provider. Make  sure you discuss any questions you have with your health care provider.   Preventive Care for Adults Ages 45 and over  Blood pressure check.** / Every 1 to 2 years.  Lipid and cholesterol check.**/ Every 5 years beginning at age 8.  Lung cancer screening. / Every year if you are aged 12-80 years and have a 30-pack-year history of smoking and currently smoke or have quit within the past 15 years. Yearly screening is stopped once you have quit smoking for at least 15 years or develop a health problem that would prevent you from having lung cancer treatment.  Fecal occult blood test (FOBT) of stool. / Every year beginning at age 59 and continuing until age 70. You may not have to do this test if you get a colonoscopy every 10 years.  Flexible sigmoidoscopy** or colonoscopy.** / Every 5 years for a flexible sigmoidoscopy or every 10 years for a colonoscopy beginning at age 23 and continuing until age 74.  Hepatitis C blood test.** / For all people born from [birth year] through 1966 and any individual with known risks for hepatitis C.  Abdominal aortic aneurysm (AAA) screening.** / A one-time screening for ages 75 to 62 years who are current or former smokers.  Skin self-exam. / Monthly.  Influenza vaccine. / Every year.  Tetanus, diphtheria, and acellular pertussis (Tdap/Td) vaccine.** / 1 dose of Td every 10 years.  Varicella vaccine.** / Consult your health care provider.  Zoster vaccine.** / 1 dose for adults aged 64 years or older.  Pneumococcal 13-valent conjugate (PCV13) vaccine.** / Consult your health care provider.  Pneumococcal polysaccharide (PPSV23) vaccine.** / 1 dose for all adults aged 83 years and older.  Meningococcal vaccine.** / Consult your health care provider.  Hepatitis A vaccine.** / Consult your health care provider.  Hepatitis B vaccine.** / Consult your health care provider.  Haemophilus influenzae type b (Hib) vaccine.** / Consult your health care  provider. **Family history and personal history of risk and conditions may change your health care provider's recommendations. Document Released: 02/14/2001 Document Revised: 12/24/2012 Document Reviewed: 05/16/2010 [facility] Patient Information 2015 [place], [place]. This information is not intended to replace advice given to you by your health care provider. Make sure you discuss any questions you have with your health care provider.

## 2014-08-28 NOTE — Progress Notes (Signed)
Pre visit review using our clinic review tool, if applicable. No additional management support is needed unless otherwise documented below in the visit note. 

## 2014-08-28 NOTE — Progress Notes (Signed)
Subjective:    Patient ID: [place], female    DOB: April 18, 1940, 74 y.o.   MRN: [id number]  DOS:  08/28/2014 Type of visit - description :  Here for Medicare AWV:  1. Risk factors based on Past M, S, F history: reviewed 2. Physical Activities:  Takes walks qd  3. Depression/mood: neg screening  4. Hearing:  No problems noted or reported  5. ADL's: independent, drives  6. Fall Risk: no recent falls, prevention discussed , see AVS 7. home Safety: does feel safe at home  8. Height, weight, & visual acuity: see VS, no recent eye check ---- referral done  9. Counseling: provided 10. Labs ordered based on risk factors: if needed  11. Referral Coordination: if needed 12. Care Plan, see assessment and plan , written personalized plan provided , see AVS 13. Cognitive Assessment: motor skills and cognition appropriate for age 16. Care team updated  15. End-of-life care regards  HC-POA  In addition, today we discussed the following: Diabetes: Per endocrinology Hypertension: Good compliance with medication, BP today is very good. High cholesterol, on 2 medications, good DJD: on going L leg pain despite taking hydrocodone    Review of Systems Constitutional: No fever. No chills. No unexplained wt changes. No unusual sweats  HEENT: No dental problems, no ear discharge, no facial swelling, no voice changes. No eye discharge, no eye  redness , no  intolerance to light   Respiratory: No wheezing , no  difficulty breathing. No cough , no mucus production  Cardiovascular: No CP, no leg swelling , no  Palpitations  GI: no nausea, no vomiting, no diarrhea , no  abdominal pain.  No blood in the stools. No dysphagia, no odynophagia    Endocrine: No polyphagia, no polyuria , no polydipsia  GU: No dysuria, gross hematuria, difficulty urinating. No urinary urgency, no frequency. No vaginal bleeding or vaginal discharge  Musculoskeletal: No joint swellings or unusual aches or  pains  Skin: No change in the color of the skin, palor , no  Rash. Had 2 boils on the left leg, they resolved but  skin is not completely back to normal.  Allergic, immunologic: No environmental allergies , no  food allergies  Neurological: No dizziness no  syncope. No headaches. No diplopia, no slurred, no slurred speech, no motor deficits, no facial  Numbness  Hematological: No enlarged lymph nodes, no easy bruising , no unusual bleedings  Psychiatry: No suicidal ideas, no hallucinations, no beavior problems, no confusion.  No unusual/severe anxiety, no depression   Past Medical History  Diagnosis Date  . Hypertension   . CAD (coronary artery disease)     Dr Gwenlyn Found  . Venous insufficiency   . Hyperlipidemia   . Diabetes mellitus   . GERD (gastroesophageal reflux disease)   . Gastritis   . Hx of colonic polyps   . Malignant neoplasm of small intestine   . Ventral hernia   . DJD (degenerative joint disease)   . Headache(784.0)   . Depression   . History of shingles   . History of anemia   . Vitamin B12 deficiency   . 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 Cabg x 5  1997  . Cesarean section    . Left knee surgery    . Tonsillectomy    . Tubal ligation    . 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 March Rummage  .  Lap ventral hernia repair with incarcerated colon  09/2008    Dr. Lucia Gaskins    Social History   Social History  . Marital Status: Widowed    Spouse Name: N/A  . Number of Children: 2  . Years of Education: N/A   Occupational History  . retired-- [employer]    .     Social History Main Topics  . Smoking status: Never Smoker   . Smokeless tobacco: Never Used     Comment: NEVER SMOKED  . Alcohol Use: No  . Drug Use: No  . Sexual Activity: Not on file   Other Topics Concern  . Not on file   Social History Narrative   Lives by herself, lost husband ~ 2004     Family History   Problem Relation Age of Onset  . Heart disease Mother   . Heart disease Father   . Hypertension Child   . Colon cancer Neg Hx   . Breast cancer Neg Hx        Medication List       This list is accurate as of: 08/28/14  8:18 AM.  Always use your most recent med list.               aspirin 325 MG tablet  Take 325 mg by mouth daily.     atenolol 50 MG tablet  Commonly known as:  TENORMIN  Take 1 tablet (50 mg total) by mouth daily.     atorvastatin 20 MG tablet  Commonly known as:  LIPITOR  TAKE 1 TABLET(20 MG) BY MOUTH DAILY     cholecalciferol 1000 UNITS tablet  Commonly known as:  VITAMIN D  Take 1,000 Units by mouth daily.     clopidogrel 75 MG tablet  Commonly known as:  PLAVIX  Take 1 tablet (75 mg total) by mouth daily.     clorazepate 7.5 MG tablet  Commonly known as:  TRANXENE  Take 1 tablet (7.5 mg total) by mouth as needed.     dicyclomine 20 MG tablet  Commonly known as:  BENTYL  Take 1 tablet (20 mg total) by mouth 3 (three) times daily as needed (for abdominal cramping).     docusate sodium 100 MG capsule  Commonly known as:  COLACE  Take 100 mg by mouth 2 (two) times daily.     glucose blood test strip  Commonly known as:  ONE TOUCH ULTRA TEST  USE AS DIRECTED to check blood sugar 3 times per day dx code E11.65     hydrochlorothiazide 25 MG tablet  Commonly known as:  HYDRODIURIL  Take 1 tablet daily     HYDROcodone-acetaminophen 5-325 MG per tablet  Commonly known as:  NORCO/VICODIN  Take 1 tablet by mouth 3 (three) times daily as needed.     insulin NPH Human 100 UNIT/ML injection  Commonly known as:  HUMULIN N,NOVOLIN N  Inject 34 units into the skin every morning and inject 18 units into the skin at bedtime.     Insulin Pen Needle 32G X 4 MM Misc  Commonly known as:  BD PEN NEEDLE NANO U/F  Use 3 needles per day.     insulin regular 100 units/mL injection  Commonly known as:  NOVOLIN R,HUMULIN R  Inject into the skin 3 (three)  times daily before meals. Inject 12 units every morning and evening with meals and inject 14 units with lunch.     INSULIN SYRINGE .5CC/31GX5/16" 31G X 5/16" 0.5 ML  Misc  Use 5 syringes per day     irbesartan 300 MG tablet  Commonly known as:  AVAPRO  TAKE 1 TABLET(300 MG) BY MOUTH DAILY     metFORMIN 500 MG tablet  Commonly known as:  GLUCOPHAGE  TAKE 1 TABLET(500 MG) BY MOUTH TWICE DAILY WITH A MEAL     multivitamin capsule  Take 1 capsule by mouth daily.     neomycin-polymyxin-hydrocortisone otic solution  Commonly known as:  CORTISPORIN  2-3 drops in each ear three times daily     niacin 500 MG CR tablet  Commonly known as:  NIASPAN  TAKE 1 TABLET BY MOUTH DAILY     omeprazole 40 MG capsule  Commonly known as:  PRILOSEC  Take 1 capsule (40 mg total) by mouth daily.     ONE TOUCH ULTRA SYSTEM KIT W/DEVICE Kit  1 kit by Does not apply route once.     VICTOZA 18 MG/3ML Sopn  Generic drug:  Liraglutide  INJECT 1.2 MG AS DIRECTED DAILY           Objective:   Physical Exam BP 118/76 mmHg  Pulse 73  Temp(Src) 97.6 F (36.4 C) (Oral)  Ht 5' 7"  (1.702 m)  Wt 170 lb 2 oz (77.168 kg)  BMI 26.64 kg/m2  SpO2 95% General:   Well developed, well nourished . NAD.  HEENT:  Normocephalic . Face symmetric, atraumatic Neck: No thyromegaly Lungs:  CTA B Normal respiratory effort, no intercostal retractions, no accessory muscle use. Breast: Right breast without no other mass Left breast: Has a well-healed surgical scar on the upper medial quadrant (from a port-o-cath) the area around  is quite nodular and slightly tender. Axillary areas: No lymphadenopathies Heart: RRR,  no murmur.  no pretibial edema bilaterally  MSK: Hands with fibrotic cords B Abdomen:  Not distended, soft, non-tender. No rebound or rigidity. No bruit  Skin: Not pale. Not jaundice. And the external aspect of the left thigh has what seems to be dry up small boil, less than 1 cm in size. No dark  lesions. Neurologic:  alert & oriented X3.  Speech normal, gait appropriate for age and unassisted Psych--  Cognition and judgment appear intact.  Cooperative with normal attention span and concentration.  Behavior appropriate. No anxious or depressed appearing.    Assessment & Plan:      Resolved boil, L leg, recommend observation

## 2014-08-30 ENCOUNTER — Encounter: Payer: Self-pay | Admitting: Internal Medicine

## 2014-08-30 DIAGNOSIS — Z0000 Encounter for general adult medical examination without abnormal findings: Secondary | ICD-10-CM | POA: Insufficient documentation

## 2014-08-30 DIAGNOSIS — M720 Palmar fascial fibromatosis [Dupuytren]: Secondary | ICD-10-CM | POA: Insufficient documentation

## 2014-08-30 HISTORY — DX: Palmar fascial fibromatosis (dupuytren): M72.0

## 2014-08-30 NOTE — Assessment & Plan Note (Signed)
asx

## 2014-08-30 NOTE — Assessment & Plan Note (Signed)
Hypertension: was  switch   losartan to irbesartan 300 mg once daily ~07-27-14, subsequent BMP satisfactory with a  creatinine of 1.3, close to baseline. Continue with present care

## 2014-08-30 NOTE — Assessment & Plan Note (Signed)
choesterol: Last cholesterol panel excellent. Continue current medications

## 2014-08-30 NOTE — Assessment & Plan Note (Signed)
DJD: Ongoing pain, left leg proximal to the knee. Refer to orthopedic surgery

## 2014-08-30 NOTE — Assessment & Plan Note (Signed)
Dupuytren's contractures: Was seen by orthopedics before, at the time there was no indication for surgery, the patient is not interested on further eval at this time.

## 2014-08-30 NOTE — Assessment & Plan Note (Signed)
Vit B 12 has been consistently wnl, will recheck in the fututre

## 2014-08-30 NOTE — Assessment & Plan Note (Addendum)
Tdap--09/01/10 ;PNA--2010 ;prevnar--2015; Shingles--03/19/13    Pap--no recent PAP, married one time, had numerous Paps before, no h/o abnormal PAPs, asx  MMG--last on record 2012, refer for a MMG, breast exam today shows some abnormalities in the left breast, will also get ultrasound  Bone Density--DUE, order entered  CCS--cscope 2011, due 2016, already working on get that done   diet and exercise discussed

## 2014-09-01 ENCOUNTER — Encounter: Payer: Self-pay | Admitting: Internal Medicine

## 2014-09-04 ENCOUNTER — Telehealth: Payer: Self-pay

## 2014-09-04 NOTE — Telephone Encounter (Signed)
UDS: 08/28/2014  Positive for Hydrocodone   Low risk per Dr. Larose Kells 09/04/2014

## 2014-09-17 DIAGNOSIS — H25013 Cortical age-related cataract, bilateral: Secondary | ICD-10-CM | POA: Diagnosis not present

## 2014-09-17 DIAGNOSIS — H2513 Age-related nuclear cataract, bilateral: Secondary | ICD-10-CM | POA: Diagnosis not present

## 2014-09-17 DIAGNOSIS — E119 Type 2 diabetes mellitus without complications: Secondary | ICD-10-CM | POA: Diagnosis not present

## 2014-09-17 LAB — HM DIABETES EYE EXAM

## 2014-09-18 ENCOUNTER — Telehealth: Payer: Self-pay | Admitting: Endocrinology

## 2014-09-18 ENCOUNTER — Other Ambulatory Visit: Payer: Self-pay | Admitting: Endocrinology

## 2014-09-18 ENCOUNTER — Other Ambulatory Visit: Payer: Self-pay | Admitting: *Deleted

## 2014-09-18 MED ORDER — METFORMIN HCL 500 MG PO TABS: ORAL_TABLET | ORAL | Status: DC

## 2014-09-18 NOTE — Telephone Encounter (Signed)
Pharmacy states metformin is 3 times daily please send new script to [facility] on [address]

## 2014-09-18 NOTE — Telephone Encounter (Signed)
rx sent

## 2014-09-23 ENCOUNTER — Encounter: Payer: Self-pay | Admitting: Physician Assistant

## 2014-09-27 ENCOUNTER — Other Ambulatory Visit: Payer: Self-pay | Admitting: Internal Medicine

## 2014-10-01 ENCOUNTER — Telehealth: Payer: Self-pay | Admitting: *Deleted

## 2014-10-01 ENCOUNTER — Encounter: Payer: Self-pay | Admitting: Physician Assistant

## 2014-10-01 ENCOUNTER — Ambulatory Visit (INDEPENDENT_AMBULATORY_CARE_PROVIDER_SITE_OTHER): Payer: Medicare Other | Admitting: Physician Assistant

## 2014-10-01 VITALS — BP 118/76 | HR 76 | Ht 65.25 in | Wt 168.0 lb

## 2014-10-01 DIAGNOSIS — R1310 Dysphagia, unspecified: Secondary | ICD-10-CM

## 2014-10-01 DIAGNOSIS — Z7901 Long term (current) use of anticoagulants: Secondary | ICD-10-CM | POA: Diagnosis not present

## 2014-10-01 DIAGNOSIS — Z1211 Encounter for screening for malignant neoplasm of colon: Secondary | ICD-10-CM | POA: Diagnosis not present

## 2014-10-01 DIAGNOSIS — I2510 Atherosclerotic heart disease of native coronary artery without angina pectoris: Secondary | ICD-10-CM | POA: Diagnosis not present

## 2014-10-01 DIAGNOSIS — Z86010 Personal history of colonic polyps: Secondary | ICD-10-CM

## 2014-10-01 NOTE — Patient Instructions (Signed)
You have been scheduled for an endoscopy and colonoscopy. Please follow the written instructions given to you at your visit today. We have given you a free prepsample. If you use inhalers (even only as needed), please bring them with you on the day of your procedure.

## 2014-10-01 NOTE — Telephone Encounter (Signed)
  10/01/2014   RE: [name] DOB: 1940/06/16 MRN: [id number]   Dear  Dr. Quay Burow,    We have scheduled the above patient for an endoscopic procedure. Our records show that she is on anticoagulation therapy.   Please advise as to how long the patient may come off her therapy of Plavix prior to the procedure, which is scheduled for 11-03-2014.  Please fax back/ or route the completed form to [facility] at [phone].   Sincerely,    Amy Esterwood PA-C

## 2014-10-01 NOTE — Progress Notes (Signed)
Patient ID: [name], female   DOB: 1940-04-27, 74 y.o.   MRN: [id number]   Subjective:    Patient ID: [name], female    DOB: [birth year], 74 y.o.   MRN: [id number]  HPI [name] is a pleasant 74 year old female, former patient of Dr. Sydell Axon Brodie's who comes in today to discuss recall colonoscopy. She is on Plavix and aspirin for history of coronary disease. Patient has history of CLL, hypertension, coronary artery disease status post CABG 55. 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  She has history of adenomatous colon polyps and had colonoscopy in June 2011 with 2 polyps removed from the cecum both of which were tubular adenomas also has internal hemorrhoids. She was slated for 5 year interval follow-up. Patient is not having any problems with abdominal pain and changes in bowel habits melena or hematochezia. She does mention that she's having frequent episodes of a sensation of her food lodging. She says this happens very often eating out and is happening more and more often at home with solid food. She says she eats a bite or 2 of food becomes lodged and she's unable to continue eating. She will eventually have to regurgitate and then brings up food and a lot of mucus. Generally no difficulty with liquids. Some choking episodes- she gets burning and indigestion. She is on Prilosec 40 mg chronically. She has  had prior EGD in 2011 with finding of severe gastritis in the antrum otherwise a negative exam.  Review of Systems Pertinent positive and negative review of systems were noted in the above HPI section.  All other review of systems was otherwise negative.  Outpatient Encounter Prescriptions as of 10/01/2014  Medication Sig  . aspirin 325 MG tablet Take 325 mg by mouth daily.  Marland Kitchen atenolol (TENORMIN) 50 MG tablet Take 1 tablet (50 mg total) by mouth daily.  Marland Kitchen atorvastatin (LIPITOR) 20 MG tablet TAKE  1 TABLET(20 MG) BY MOUTH DAILY  . cholecalciferol (VITAMIN D) 1000 UNITS tablet Take 1,000 Units by mouth daily.    . clopidogrel (PLAVIX) 75 MG tablet Take 1 tablet (75 mg total) by mouth daily.  . clorazepate (TRANXENE) 7.5 MG tablet Take 1 tablet (7.5 mg total) by mouth as needed.  . dicyclomine (BENTYL) 20 MG tablet Take 1 tablet (20 mg total) by mouth 3 (three) times daily as needed (for abdominal cramping).  Marland Kitchen docusate sodium (COLACE) 100 MG capsule Take 100 mg by mouth 2 (two) times daily.  Marland Kitchen glucose blood (ONE TOUCH ULTRA TEST) test strip USE AS DIRECTED to check blood sugar 3 times per day dx code E11.65  . hydrochlorothiazide (HYDRODIURIL) 25 MG tablet TAKE 1 TABLET BY MOUTH DAILY  . HYDROcodone-acetaminophen (NORCO/VICODIN) 5-325 MG per tablet Take 1 tablet by mouth 3 (three) times daily as needed.  . insulin NPH Human (HUMULIN N,NOVOLIN N) 100 UNIT/ML injection Inject 34 units into the skin every morning and inject 18 units into the skin at bedtime.  . insulin regular (NOVOLIN R,HUMULIN R) 100 units/mL injection Inject into the skin 3 (three) times daily before meals. Inject 12 units every morning and evening with meals and inject 14 units with lunch.  . irbesartan (AVAPRO) 300 MG tablet TAKE 1 TABLET(300 MG) BY MOUTH DAILY  . metFORMIN (GLUCOPHAGE) 500 MG tablet Take 1 tablet in the AM and 2 tablets in the PM.  . Multiple Vitamin (MULTIVITAMIN) capsule Take 1 capsule  by mouth daily.    Marland Kitchen neomycin-polymyxin-hydrocortisone (CORTISPORIN) otic solution 2-3 drops in each ear three times daily  . niacin (NIASPAN) 500 MG CR tablet Take 1 tablet (500 mg total) by mouth daily.  Marland Kitchen omeprazole (PRILOSEC) 40 MG capsule Take 1 capsule (40 mg total) by mouth daily. (Patient taking differently: Take 40 mg by mouth as needed. )  . VICTOZA 18 MG/3ML SOPN INJECT 1.2 MG AS DIRECTED DAILY  . Blood Glucose Monitoring Suppl (ONE TOUCH ULTRA SYSTEM KIT) W/DEVICE KIT 1 kit by Does not apply route once.  (Patient not taking: Reported on 10/01/2014)  . Insulin Pen Needle (BD PEN NEEDLE NANO U/F) 32G X 4 MM MISC Use 3 needles per day. (Patient not taking: Reported on 10/01/2014)  . Insulin Syringe-Needle U-100 (INSULIN SYRINGE .5CC/31GX5/16") 31G X 5/16" 0.5 ML MISC Use 5 syringes per day (Patient not taking: Reported on 10/01/2014)   No facility-administered encounter medications on file as of 10/01/2014.   Allergies  Allergen Reactions  . Aleve [Naproxen Sodium]     Or Advil  . Codeine     REACTION: makes her nervous, orTylenol #3  . Ibuprofen     REACTION: nervous  . Meperidine Hcl     REACTION: nasuea and vomitting  . Naproxen Sodium     REACTION: nervous   Patient Active Problem List   Diagnosis Date Noted  . Annual physical exam 08/30/2014  . 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 CLL (chronic lymphocytic leukemia) 02/25/2014  . Chronic kidney disease, stage III (moderate) 09/18/2013  . Anxiety and depression 10/03/2010  . IBS (irritable bowel syndrome) 09/01/2010  . Vitamin B deficiency 05/20/2009  . HERNIA, VENTRAL 08/27/2008  . ANEMIA, HX OF 08/27/2008  . GASTRITIS, HX OF 08/27/2008  . MALIGNANT NEOPLASM SMALL INTESTINE UNSPEC SITE 12/20/2006  . COLONIC POLYPS 12/20/2006  . VENOUS INSUFFICIENCY 12/20/2006  . DM ketoacidosis type II, uncontrolled-- per endocrinology 10/16/2006  . Hyperlipidemia 10/16/2006  . Essential hypertension 10/16/2006  . Coronary atherosclerosis 10/16/2006  . GERD 10/16/2006  . 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 HEADACHE 10/16/2006  . SHINGLES, HX OF 10/16/2006   Social History   Social History  . Marital Status: Widowed    Spouse Name: N/A  . Number of Children: 2  . Years of Education: N/A   Occupational History  . retired-- [employer]    .     Social History Main Topics  . Smoking status: Never Smoker   . Smokeless tobacco: Never Used     Comment: NEVER SMOKED  . Alcohol Use: No  . Drug Use: No  . Sexual  Activity: Not on file   Other Topics Concern  . Not on file   Social History Narrative   Lives by herself, lost husband ~ 2004    Ms. Wolgamott's family history includes Emphysema (age of onset: 38) in her brother; Heart disease in her maternal aunt and maternal uncle; Heart disease (age of onset: 67) in her father; Heart disease (age of onset: 38) in her mother; Hypertension in her child. There is no history of Colon cancer or Breast cancer.      Objective:    Filed Vitals:   10/01/14 0854  BP: 118/76  Pulse: 76    Physical Exam  well-developed elderly female in no acute distress, quite pleasant blood pressure 118/76 pulse 76 height 5 foot 5 weight 168. HEENT nontraumatic normocephalic EOMI PERRLA sclera anicteric, Supple; no JVD, Cardiovascular; reg;ular rate and rhythm with  S1-S2 to does have a sternal incisional scar, Pulmonary; clear bilaterally, Abdomen; soft nontender nondistended bowel sounds are active there is no palpable mass or hepatosplenomegaly she does and midline incisional scar, Rectal; exam not done, Extremities; no clubbing cyanosis or edema skin warm and dry, Neuropsych; mood and affect appropriate       Assessment & Plan:   #1 74 yo female with hx of adenomatous polyps- due for follow up colonoscopy #2 Solid food dysphagia with episodes of transient  impaction- r/o stricture #3 chronic antiplatelet therapy- ASA and Plavix #3 CAD-s/p CABG  #4 hx of small bowel cancer with obstruction 2000- s/p resection/chemo #5 CLL #6 AODM- ID  Plan; Will schedule for Colonoscopy and EGD with possible dilation with Dr Silverio Decamp . Procedures discussed in detail  with pt and she is agreeable to proceed.  Will communicate with her cardiologist Dr. Gwenlyn Found regarding holding Plavix and  ASA for 5 days prior to procedures to assure this is reasonable for this pt.  She will continue Omeprazole 40 mg po daily in am  Discussed careful chewing, and cutting food in to small pieces,  avoiding meat etc in interim   [name] 10/01/2014   Cc: Colon Branch, MD

## 2014-10-04 NOTE — Progress Notes (Signed)
Agree with Ms. Genia Harold assessment and plan.  I would add a barium swallow with tablet also - she is on for EGD/colonoscopy 11/1 with Dr.Namdigan and Ba swallow will help triage the dysphagia in the interim.   Gatha Mayer, MD, [facility]

## 2014-10-05 NOTE — Telephone Encounter (Signed)
Can safely interrupt plavix Rx for procedure

## 2014-10-06 NOTE — Progress Notes (Signed)
Please call pt and see if she has had any more episodes of food lodging since I saw her .Marland Kitchen If so please order a Barium swallow with tablet.Rance Muir suggesting she might need EGD sooner than Nov 1 st when she is scheduled for EGD and Colon

## 2014-10-07 NOTE — Telephone Encounter (Signed)
Lm for the patient on # [phone].  Advised her to stop the Plavix on 10-27 and resume it on 11-04-2014.  Asked on my message for her to call me if she has any questions.

## 2014-10-07 NOTE — Telephone Encounter (Signed)
Called the patient on her cell # [phone], and advised her to hold the Plavix on 10-27 and resume it on 11-04-2014, the day after her colonoscopy.  Patient verbalized understanding the instructions.

## 2014-10-09 ENCOUNTER — Encounter: Payer: Self-pay | Admitting: *Deleted

## 2014-10-09 DIAGNOSIS — M65342 Trigger finger, left ring finger: Secondary | ICD-10-CM | POA: Diagnosis not present

## 2014-10-09 DIAGNOSIS — M65332 Trigger finger, left middle finger: Secondary | ICD-10-CM | POA: Diagnosis not present

## 2014-10-09 DIAGNOSIS — M720 Palmar fascial fibromatosis [Dupuytren]: Secondary | ICD-10-CM | POA: Diagnosis not present

## 2014-10-09 DIAGNOSIS — M1812 Unilateral primary osteoarthritis of first carpometacarpal joint, left hand: Secondary | ICD-10-CM | POA: Diagnosis not present

## 2014-10-12 ENCOUNTER — Encounter: Payer: Medicare Other | Admitting: Gastroenterology

## 2014-10-16 DIAGNOSIS — M25562 Pain in left knee: Secondary | ICD-10-CM | POA: Diagnosis not present

## 2014-10-16 DIAGNOSIS — M170 Bilateral primary osteoarthritis of knee: Secondary | ICD-10-CM | POA: Diagnosis not present

## 2014-10-19 DIAGNOSIS — H31093 Other chorioretinal scars, bilateral: Secondary | ICD-10-CM | POA: Diagnosis not present

## 2014-10-19 DIAGNOSIS — H43813 Vitreous degeneration, bilateral: Secondary | ICD-10-CM | POA: Diagnosis not present

## 2014-10-19 DIAGNOSIS — E113291 Type 2 diabetes mellitus with mild nonproliferative diabetic retinopathy without macular edema, right eye: Secondary | ICD-10-CM | POA: Diagnosis not present

## 2014-10-19 DIAGNOSIS — E113212 Type 2 diabetes mellitus with mild nonproliferative diabetic retinopathy with macular edema, left eye: Secondary | ICD-10-CM | POA: Diagnosis not present

## 2014-11-01 ENCOUNTER — Telehealth: Payer: Self-pay | Admitting: Internal Medicine

## 2014-11-01 NOTE — Telephone Encounter (Signed)
At the time of her last visitI recommended a MMG and [place], don't see results, please ask pt if had tests done, if not  >>>> schedule

## 2014-11-02 NOTE — Telephone Encounter (Signed)
We'll discuss at the next opportunity

## 2014-11-02 NOTE — Telephone Encounter (Signed)
Per referral notes, Pt declined to have MMG and [place].

## 2014-11-03 ENCOUNTER — Encounter: Payer: Self-pay | Admitting: Gastroenterology

## 2014-11-03 ENCOUNTER — Ambulatory Visit (AMBULATORY_SURGERY_CENTER): Payer: Medicare Other | Admitting: Gastroenterology

## 2014-11-03 VITALS — BP 168/73 | HR 69 | Temp 97.4°F | Resp 17 | Ht 65.0 in | Wt 168.0 lb

## 2014-11-03 DIAGNOSIS — E119 Type 2 diabetes mellitus without complications: Secondary | ICD-10-CM | POA: Diagnosis not present

## 2014-11-03 DIAGNOSIS — K219 Gastro-esophageal reflux disease without esophagitis: Secondary | ICD-10-CM | POA: Diagnosis not present

## 2014-11-03 DIAGNOSIS — R1310 Dysphagia, unspecified: Secondary | ICD-10-CM | POA: Diagnosis present

## 2014-11-03 DIAGNOSIS — D122 Benign neoplasm of ascending colon: Secondary | ICD-10-CM

## 2014-11-03 DIAGNOSIS — D123 Benign neoplasm of transverse colon: Secondary | ICD-10-CM

## 2014-11-03 DIAGNOSIS — Z86010 Personal history of colonic polyps: Secondary | ICD-10-CM

## 2014-11-03 DIAGNOSIS — K2950 Unspecified chronic gastritis without bleeding: Secondary | ICD-10-CM | POA: Diagnosis not present

## 2014-11-03 DIAGNOSIS — K2990 Gastroduodenitis, unspecified, without bleeding: Secondary | ICD-10-CM

## 2014-11-03 DIAGNOSIS — I2510 Atherosclerotic heart disease of native coronary artery without angina pectoris: Secondary | ICD-10-CM | POA: Diagnosis not present

## 2014-11-03 DIAGNOSIS — K2970 Gastritis, unspecified, without bleeding: Secondary | ICD-10-CM

## 2014-11-03 DIAGNOSIS — K228 Other specified diseases of esophagus: Secondary | ICD-10-CM | POA: Diagnosis not present

## 2014-11-03 DIAGNOSIS — Z951 Presence of aortocoronary bypass graft: Secondary | ICD-10-CM | POA: Diagnosis not present

## 2014-11-03 DIAGNOSIS — K589 Irritable bowel syndrome without diarrhea: Secondary | ICD-10-CM | POA: Diagnosis not present

## 2014-11-03 LAB — GLUCOSE, CAPILLARY
GLUCOSE-CAPILLARY: 113 mg/dL — AB (ref 65–99)
Glucose-Capillary: 83 mg/dL (ref 65–99)

## 2014-11-03 MED ORDER — SODIUM CHLORIDE 0.9 % IV SOLN: 500.0000 mL | INTRAVENOUS | Status: DC

## 2014-11-03 NOTE — Progress Notes (Signed)
Stable to RR 

## 2014-11-03 NOTE — Op Note (Addendum)
[place]  [address] [place], [address]   COLONOSCOPY PROCEDURE REPORT  PATIENT: [name], [name]  MR#: [id number] BIRTHDATE: 19-Nov-1940 , 74  yrs. old GENDER: female ENDOSCOPIST: Harl Bowie, MD REFERRED ET:KKOE Larose Kells, M.D. PROCEDURE DATE:  11/03/2014 PROCEDURE:   Colonoscopy with snare polypectomy and Colonoscopy with cold biopsy polypectomy First Screening Colonoscopy - Avg.  risk and is 50 yrs.  old or older - No.  Prior Negative Screening - Now for repeat screening. N/A  History of Adenoma - Now for follow-up colonoscopy & has been > or = to 3 yrs.  Yes hx of adenoma.  Has been 3 or more years since last colonoscopy.  Polyps removed today? Yes ASA CLASS:   Class II INDICATIONS:Surveillance due to prior colonic neoplasia and PH Colon Adenoma. MEDICATIONS: Propofol 250 mg IV  DESCRIPTION OF PROCEDURE:   After the risks benefits and alternatives of the procedure were thoroughly explained, informed consent was obtained.  The digital rectal exam revealed no abnormalities of the rectum.   The LB [id number]  endoscope was introduced through the anus and advanced to the terminal ileum which was intubated for a short distance. No adverse events experienced.   The quality of the prep was excellent.  The instrument was then slowly withdrawn as the colon was fully examined. Estimated blood loss is zero unless otherwise noted in this procedure report.  COLON FINDINGS: Terminal ileum appeared normal.  2 mm sessile polyp in ascending colon, 3 mm sessile polyp in transverse colon removed with cold biopsy forceps.  5 mm sessile polyp in ascending colon and 7 mm sessile polyp in transverse colon removed by cold snare, tissue retrieved.  Small internal hemorrhoids, non bleeding. Retroflexed views revealed internal hemorrhoids. The time to cecum = 3.0 Withdrawal time = 15.8   The scope was withdrawn and the procedure completed. COMPLICATIONS: There were  no immediate complications.  ENDOSCOPIC IMPRESSION: 5 small sub cm sessile polyps removed Small internal hemorrhoids  RECOMMENDATIONS: 1.  If the polyp(s) removed today are proven to be adenomatous (pre-cancerous) polyps, you will need a colonoscopy in 3 years. Otherwise you should continue to follow colorectal cancer screening guidelines for "routine risk" patients with a colonoscopy in 10 years.  You will receive a letter within 1-2 weeks with the results of your biopsy as well as final recommendations.  Please call my office if you have not received a letter after 3 weeks. 2.  Await pathology results 3. Ok to restart Plavix tomorrow  eSigned:  Harl Bowie, MD 11/03/2014 9:25 AM Revised: 11/03/2014 9:25 AM

## 2014-11-03 NOTE — Progress Notes (Signed)
Pt was in recovery longer, waiting on Dr and going over instructions took longer.

## 2014-11-03 NOTE — Patient Instructions (Addendum)

## 2014-11-03 NOTE — Progress Notes (Signed)
Called to room to assist during endoscopic procedure.  Patient ID and intended procedure confirmed with present staff. Received instructions for my participation in the procedure from the performing physician.  

## 2014-11-03 NOTE — Op Note (Addendum)
[place]  [address]. [place], [address]   ENDOSCOPY PROCEDURE REPORT  PATIENT: [name], [name]  MR#: [id number] BIRTHDATE: Nov 15, 1940 , 74  yrs. old GENDER: female ENDOSCOPIST: Harl Bowie, MD REFERRED BY:  Kathlene November, M.D. PROCEDURE DATE:  11/03/2014 PROCEDURE:  EGD w/ biopsy ASA CLASS:     Class II INDICATIONS:  dysphagia. MEDICATIONS: Propofol 150 mg IV and Lidocaine 40 mg IV TOPICAL ANESTHETIC: none  DESCRIPTION OF PROCEDURE: After the risks benefits and alternatives of the procedure were thoroughly explained, informed consent was obtained.  The LB [id number] endoscope was introduced through the mouth and advanced to the second portion of the duodenum , Without limitations.  The instrument was slowly withdrawn as the mucosa was fully examined.    GE junction at 36 cm, LA grade B erosive esophagitis.  No distal esophageal stricture or ring.  No difficulty traversing the GE junction. Random biopsies were obtained from distal and proximal esophagus. Multiple erosions with gastritis, predominantly in the antrum. small sliding hiatal hernia.  Random biopsies were obtained from antrum and body of stomach. Mild duodenitis in the bulb, second part of duodenum appeared normal.  Retroflexed views revealed a hiatal hernia.     The scope was then withdrawn from the patient and the procedure completed.  COMPLICATIONS: There were no immediate complications.  ENDOSCOPIC IMPRESSION: LA grade B esophagitis Erosive gastritis  RECOMMENDATIONS: Prilosec 40 mg daily Follow up biopsy results OK to restart Plavix tomorrow    eSigned:  Harl Bowie, MD 11/03/2014 9:24 AM Revised: 11/03/2014 9:24 AM

## 2014-11-04 ENCOUNTER — Telehealth: Payer: Self-pay

## 2014-11-04 NOTE — Telephone Encounter (Signed)
  Follow up Call-  Call back number 11/03/2014  Post procedure Call Back phone  # [phone]  Permission to leave phone message Yes     Patient questions:  Do you have a fever, pain , or abdominal swelling? No. Pain Score  0 *  Have you tolerated food without any problems? Yes.    Have you been able to return to your normal activities? Yes.    Do you have any questions about your discharge instructions: Diet   No. Medications  No. Follow up visit  No.  Do you have questions or concerns about your Care? No.  Actions: * If pain score is 4 or above: No action needed, pain <4.  No problems per the pt. maw

## 2014-11-06 DIAGNOSIS — M720 Palmar fascial fibromatosis [Dupuytren]: Secondary | ICD-10-CM | POA: Diagnosis not present

## 2014-11-06 DIAGNOSIS — M65342 Trigger finger, left ring finger: Secondary | ICD-10-CM | POA: Diagnosis not present

## 2014-11-06 DIAGNOSIS — M65331 Trigger finger, right middle finger: Secondary | ICD-10-CM | POA: Diagnosis not present

## 2014-11-13 ENCOUNTER — Encounter: Payer: Self-pay | Admitting: Gastroenterology

## 2014-11-13 DIAGNOSIS — Z23 Encounter for immunization: Secondary | ICD-10-CM | POA: Diagnosis not present

## 2014-11-16 ENCOUNTER — Other Ambulatory Visit (INDEPENDENT_AMBULATORY_CARE_PROVIDER_SITE_OTHER): Payer: Medicare Other

## 2014-11-16 ENCOUNTER — Other Ambulatory Visit: Payer: Medicare Other

## 2014-11-16 DIAGNOSIS — E1165 Type 2 diabetes mellitus with hyperglycemia: Secondary | ICD-10-CM | POA: Diagnosis not present

## 2014-11-16 DIAGNOSIS — IMO0002 Reserved for concepts with insufficient information to code with codable children: Secondary | ICD-10-CM

## 2014-11-16 LAB — BASIC METABOLIC PANEL
BUN: 23 mg/dL (ref 6–23)
CALCIUM: 9.3 mg/dL (ref 8.4–10.5)
CO2: 28 mEq/L (ref 19–32)
Chloride: 110 mEq/L (ref 96–112)
Creatinine, Ser: 1.49 mg/dL — ABNORMAL HIGH (ref 0.40–1.20)
GFR: 36.24 mL/min — AB (ref >60.00)
Glucose, Bld: 107 mg/dL — ABNORMAL HIGH (ref 70–99)
POTASSIUM: 4.5 meq/L (ref 3.5–5.1)
SODIUM: 144 meq/L (ref 135–145)

## 2014-11-16 LAB — MICROALBUMIN / CREATININE URINE RATIO
Creatinine,U: 81.5 mg/dL
MICROALB UR: 2.8 mg/dL — AB (ref 0.0–1.9)
MICROALB/CREAT RATIO: 3.4 mg/g (ref 0.0–30.0)

## 2014-11-16 LAB — HEMOGLOBIN A1C: Hgb A1c MFr Bld: 6.6 % — ABNORMAL HIGH (ref 4.6–6.5)

## 2014-11-19 ENCOUNTER — Encounter: Payer: Self-pay | Admitting: Endocrinology

## 2014-11-19 ENCOUNTER — Ambulatory Visit (INDEPENDENT_AMBULATORY_CARE_PROVIDER_SITE_OTHER): Payer: Medicare Other | Admitting: Endocrinology

## 2014-11-19 VITALS — BP 130/68 | HR 74 | Temp 98.4°F | Resp 14 | Ht 65.25 in | Wt 168.0 lb

## 2014-11-19 DIAGNOSIS — I10 Essential (primary) hypertension: Secondary | ICD-10-CM | POA: Diagnosis not present

## 2014-11-19 DIAGNOSIS — I2510 Atherosclerotic heart disease of native coronary artery without angina pectoris: Secondary | ICD-10-CM

## 2014-11-19 DIAGNOSIS — Z794 Long term (current) use of insulin: Secondary | ICD-10-CM | POA: Diagnosis not present

## 2014-11-19 DIAGNOSIS — N289 Disorder of kidney and ureter, unspecified: Secondary | ICD-10-CM

## 2014-11-19 DIAGNOSIS — E1165 Type 2 diabetes mellitus with hyperglycemia: Secondary | ICD-10-CM

## 2014-11-19 NOTE — Patient Instructions (Signed)
Reduce Metformin to 1 twice daily; use 22 N at bedtime if am sugars stay > 150  Reduce Avapro to 1/2 tab

## 2014-11-19 NOTE — Progress Notes (Signed)
Patient ID: [name], female   DOB: 1940/01/26, 74 y.o.   MRN: [id number]            Reason for Appointment: Followup for Type 2 Diabetes  Referring physician: Larose Kells  History of Present Illness:          Diagnosis: Type 2 diabetes mellitus, date of diagnosis: 1998       Past history:   She was treated with metformin at diagnosis when this had been continued until 2015 Subsequently Amaryl was also added several years ago and this has been continued Her blood sugars were under fair control between 2010 and early 2013 with A1c ranging from 7.4-9.4, mostly under 8% However since 08/2011 her A1c has been mostly over 8%  Insulin was added in 2014 with small doses of Lantus and this has been progressively increased She was started on mealtime insulin on her initial consultation in 9/15 because of high postprandial readings, sometimes over 300 With adding Victoza in 02/2014 her blood sugars were somewhat better with A1c coming down below 8%  Recent history:   INSULIN regimen: Regular 16 units before meals.  NPH 38 units in the morning and 20 at night Because of the cost of her Lantus insulin in April and this was changed to NPH insulin twice a day  Her blood sugars are more consistently controlled  with gradually increasing her insulin doses and continuing Victoza Recently he also appears to be doing better with her exercise regimen and watching diet A1c is now down to 6.6, previously 7.2  Current blood sugar patterns, daily management and problems identified:  Fasting blood sugars: These are still occasionally high but overall fairly well controlled now and better than on her last visit without any change in insulin. She may be benefiting from adding another metformin in the evening  Postprandial readings: she has only a few readings in the evenings after supper and these look fairly good Most of her sugars are being checked before supper, these are only occasionally  high Mealtime insulin: She is taking the same dose at breakfast and supper even though her meal is a relatively smaller in the morning which he does not report any symptomatic hypoglycemia      Oral hypoglycemic drugs the patient is taking are:  Metformin 500 a.m. and 1000 mg at dinner Side effects from medications have been: None Compliance with the medical regimen: good, she is taking her insulin consistently  Hypoglycemia: None  Glucose monitoring:  done 1-2 times a day         Glucometer: One Touch.      Blood Glucose readings by time of day from download:   Mean values apply above for all meters except median for One Touch  PRE-MEAL Fasting Lunch Dinner Bedtime Overall  Glucose range: 91-171   78-187   106-172    Mean/median: 130   133   142  132    Self-care: The diet that the patient has been following is: none, usually eating low fat meals Meals: 2-3 meals per day at irregular times. Dinner 6-7 pm Breakfast is a poptart but sometimes oatmeal at 10 am, usually eating sandwiches for meals, snacks will be with crackers         Dietician visit, most recent: never.  She saw the CDE in 02/2014              Exercise: yardwork, is walking recently  Weight history: 150-170 previously  Wt Readings from Last  3 Encounters:  11/19/14 168 lb (76.204 kg)  11/03/14 168 lb (76.204 kg)  10/01/14 168 lb (76.204 kg)    Glycemic control:   Lab Results  Component Value Date   HGBA1C 6.6* 11/16/2014   HGBA1C 7.2 08/19/2014   HGBA1C 7.7* 04/16/2014   Lab Results  Component Value Date   MICROALBUR 2.8* 11/16/2014   LDLCALC 40 07/22/2014   CREATININE 1.49* 11/16/2014        Medication List       This list is accurate as of: 11/19/14  9:07 PM.  Always use your most recent med list.               aspirin 325 MG tablet  Take 325 mg by mouth daily.     atenolol 50 MG tablet  Commonly known as:  TENORMIN  Take 1 tablet (50 mg total) by mouth daily.     atorvastatin 20 MG  tablet  Commonly known as:  LIPITOR  TAKE 1 TABLET(20 MG) BY MOUTH DAILY     cholecalciferol 1000 UNITS tablet  Commonly known as:  VITAMIN D  Take 1,000 Units by mouth daily.     clopidogrel 75 MG tablet  Commonly known as:  PLAVIX  Take 1 tablet (75 mg total) by mouth daily.     clorazepate 7.5 MG tablet  Commonly known as:  TRANXENE  Take 1 tablet (7.5 mg total) by mouth as needed.     dicyclomine 20 MG tablet  Commonly known as:  BENTYL  Take 1 tablet (20 mg total) by mouth 3 (three) times daily as needed (for abdominal cramping).     docusate sodium 100 MG capsule  Commonly known as:  COLACE  Take 100 mg by mouth 2 (two) times daily.     FLUZONE HIGH-DOSE 0.5 ML Susy  Generic drug:  Influenza Vac Split High-Dose  ADM 0.5ML IM UTD     glucose blood test strip  Commonly known as:  ONE TOUCH ULTRA TEST  USE AS DIRECTED to check blood sugar 3 times per day dx code E11.65     hydrochlorothiazide 25 MG tablet  Commonly known as:  HYDRODIURIL  TAKE 1 TABLET BY MOUTH DAILY     HYDROcodone-acetaminophen 5-325 MG tablet  Commonly known as:  NORCO/VICODIN  Take 1 tablet by mouth 3 (three) times daily as needed.     insulin NPH Human 100 UNIT/ML injection  Commonly known as:  HUMULIN N,NOVOLIN N  Inject 38 units into the skin every morning and inject 20 units into the skin at bedtime.     Insulin Pen Needle 32G X 4 MM Misc  Commonly known as:  BD PEN NEEDLE NANO U/F  Use 3 needles per day.     insulin regular 100 units/mL injection  Commonly known as:  NOVOLIN R,HUMULIN R  Inject into the skin 3 (three) times daily before meals. Inject 12 units every morning and evening with meals and inject 16 units with lunch.     INSULIN SYRINGE .5CC/31GX5/16" 31G X 5/16" 0.5 ML Misc  Use 5 syringes per day     irbesartan 300 MG tablet  Commonly known as:  AVAPRO  TAKE 1 TABLET(300 MG) BY MOUTH DAILY     metFORMIN 500 MG tablet  Commonly known as:  GLUCOPHAGE  Take 1 tablet in  the AM and 2 tablets in the PM.     multivitamin capsule  Take 1 capsule by mouth daily.     neomycin-polymyxin-hydrocortisone otic  solution  Commonly known as:  CORTISPORIN  2-3 drops in each ear three times daily     niacin 500 MG CR tablet  Commonly known as:  NIASPAN  Take 1 tablet (500 mg total) by mouth daily.     omeprazole 40 MG capsule  Commonly known as:  PRILOSEC  Take 1 capsule (40 mg total) by mouth daily.     ONE TOUCH ULTRA SYSTEM KIT W/DEVICE Kit  1 kit by Does not apply route once.     VICTOZA 18 MG/3ML Sopn  Generic drug:  Liraglutide  INJECT 1.2 MG AS DIRECTED DAILY        Allergies:  Allergies  Allergen Reactions  . Aleve [Naproxen Sodium]     Or Advil  . Codeine     REACTION: makes her nervous, orTylenol #3  . Ibuprofen     REACTION: nervous  . Meperidine Hcl     REACTION: nasuea and vomitting  . Naproxen Sodium     REACTION: nervous    Past Medical History  Diagnosis Date  . Hypertension   . CAD (coronary artery disease)     Dr Gwenlyn Found  . Venous insufficiency   . Hyperlipidemia   . Diabetes mellitus   . GERD (gastroesophageal reflux disease)   . Gastritis   . Hx of colonic polyps   . 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 Ventral hernia   . DJD (degenerative joint disease)   . Headache(784.0)   . Depression   . History of shingles   . History of anemia   . Vitamin B12 deficiency   . 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 Cabg x 5  1997  . Cesarean section    . Knee surgery Left 1996    11-1994 and revision 12-1994 (infex, hardware removed )  . Tonsillectomy    . Tubal ligation    . 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 March Rummage  . Lap ventral hernia repair with incarcerated colon  09/2008    Dr. Lucia Gaskins  . Rotator cuff  repair Right 1997    Family History  Problem Relation Age of Onset  . Heart disease Mother 10  . Heart disease Father 18    MI  . Hypertension Child   . Colon cancer Neg Hx   . Breast cancer Neg Hx   . Heart disease Maternal Aunt     x 2, all deceased  . Heart disease Maternal Uncle     x 4, all deceased  . Emphysema Brother 65    Social History:  reports that she has never smoked. She has never used smokeless tobacco. She reports that she does not drink alcohol or use illicit drugs.    Review of Systems        Lipids: Has had excellent control of hypercholesterolemia with Crestor which she has taken for several years She has a history of coronary bypass surgery.   Also has been started on Niaspan by PCP       Lab Results  Component Value Date   CHOL 126 07/22/2014   HDL 45* 07/22/2014   LDLCALC 40 07/22/2014   LDLDIRECT 43.1 05/08/2012   TRIG 205* 07/22/2014   CHOLHDL 2.8 07/22/2014  The blood pressure has been  well controlled  and she on 25 mg HCTZ, Avapro, and atenolol.  No hypokalemia  Lab Results  Component Value Date   CREATININE 1.49* 11/16/2014   BUN 23 11/16/2014   NA 144 11/16/2014   K 4.5 11/16/2014   CL 110 11/16/2014   CO2 28 11/16/2014    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 creatinine is relatively higher than before for no apparent reason.  Does not take any Aleve or aspirin/Motrin Appears that her losartan was changed to Avapro in 7/16    Lab Results  Component Value Date   CREATININE 1.49* 11/16/2014   CREATININE 1.30* 06/08/2014   CREATININE 1.38* 04/16/2014       No history of Numbness, tingling or burning in feet but occasional tingling in her left leg which has had severe injuries in the past Diabetic foot exam in 9/15 showed:  Decreased monofilament in toes and plantar surfaces, skin exam is normal. Pedal pulses: absent left DP   Physical Examination:  BP 130/68 mmHg  Pulse 74  Temp(Src) 98.4 F (36.9 C)  Resp 14  Ht 5' 5.25"  (1.657 m)  Wt 168 lb (76.204 kg)  BMI 27.75 kg/m2  SpO2 99%  No ankle edema Standing blood pressure same        ASSESSMENT:  Diabetes type 2, uncontrolled with BMI 27   See history of present illness for detailed discussion of her current blood sugar patterns, management and problems identified  She has been getting better blood sugar control now with increasing her metformin as well as being more active and starting a walking program. Glucose readings are better both fasting and postprandial evening without any change in her insulin on the last visit She is also taking Victoza consistently  RENAL insufficiency: Her creatinine is higher for no apparent reason.  No orthostatic drop in blood pressure and she is not on any nephrotoxic drugs. Her losartan as been changed to Avapro which is more potent ARB drug  HYPERTENSION: This appears to be better now    PLAN:  Trial of 150 mg of Avapro instead of 300 to see if renal function will improve She will try to check her blood pressure at home periodically Recheck renal function in one month  No change in NPH insulin but will reduce her regular insulin by 2 units in the morning as postprandial readings are lower and she has smaller meal in the morning Reduce metformin back to 1000 daily for now and consider increasing if creatinine better next month  More consistent glucose monitoring after all meals Discussed blood sugar targets Reminded her to take her regular insulin 30 minutes before eating  Patient Instructions  Reduce Metformin to 1 twice daily; use 22 N at bedtime if am sugars stay > 150  Reduce Avapro to 1/2 tab   Counseling time on subjects discussed above is over 50% of today's 25 minute visit   Taelyn Nemes 11/19/2014, 9:07 PM   Note: This office note was prepared with Estate agent. Any transcriptional errors that result from this process are unintentional.

## 2014-11-20 DIAGNOSIS — E113212 Type 2 diabetes mellitus with mild nonproliferative diabetic retinopathy with macular edema, left eye: Secondary | ICD-10-CM | POA: Diagnosis not present

## 2014-11-20 DIAGNOSIS — E113291 Type 2 diabetes mellitus with mild nonproliferative diabetic retinopathy without macular edema, right eye: Secondary | ICD-10-CM | POA: Diagnosis not present

## 2014-11-24 ENCOUNTER — Telehealth: Payer: Self-pay | Admitting: *Deleted

## 2014-11-24 NOTE — Telephone Encounter (Signed)
===  View-only below this line===  ----- Message -----    From: Mauri Pole, MD    Sent: 11/24/2014   1:45 PM      To: Oda Kilts, CMA Subject: RE: eSOPHAGEAL mANO                            Can we please schedule her for office visit with me first at next available appt and I can discuss the next thing to do based on her symptoms.  Thanks VN ----- Message -----    From: Oda Kilts, CMA    Sent: 11/23/2014   9:13 AM      To: Mauri Pole, MD Subject: FW: eSOPHAGEAL mANO                              ----- Message -----    From: Oda Kilts, CMA    Sent: 11/23/2014   9:12 AM      To: Oda Kilts, CMA Subject: RE: eSOPHAGEAL mANO                            Dr Silverio Decamp, Does this patient still need an Esophageal mano? ----- Message -----    From: Oda Kilts, CMA    Sent: 11/06/2014      To: Oda Kilts, CMA Subject: eSOPHAGEAL mANO                                ORDER ESOPHAGEAL MANO ON PT IF BIOPSYIES ARE NORMAL FROM EGD       L/M for patient of appointment date and time    11/30/2014 at 1:45pm

## 2014-11-30 ENCOUNTER — Other Ambulatory Visit: Payer: Self-pay

## 2014-11-30 ENCOUNTER — Ambulatory Visit (INDEPENDENT_AMBULATORY_CARE_PROVIDER_SITE_OTHER): Payer: Medicare Other | Admitting: Gastroenterology

## 2014-11-30 ENCOUNTER — Encounter: Payer: Self-pay | Admitting: Gastroenterology

## 2014-11-30 VITALS — BP 150/80 | HR 80 | Ht 65.25 in | Wt 164.5 lb

## 2014-11-30 DIAGNOSIS — K2100 Gastro-esophageal reflux disease with esophagitis, without bleeding: Secondary | ICD-10-CM

## 2014-11-30 DIAGNOSIS — I2510 Atherosclerotic heart disease of native coronary artery without angina pectoris: Secondary | ICD-10-CM

## 2014-11-30 DIAGNOSIS — R1310 Dysphagia, unspecified: Secondary | ICD-10-CM | POA: Diagnosis not present

## 2014-11-30 DIAGNOSIS — D369 Benign neoplasm, unspecified site: Secondary | ICD-10-CM | POA: Diagnosis not present

## 2014-11-30 MED ORDER — OMEPRAZOLE 40 MG PO CPDR
40.0000 mg | DELAYED_RELEASE_CAPSULE | Freq: Every day | ORAL | Status: DC
Start: 2014-11-30 — End: 2015-12-17

## 2014-11-30 NOTE — Progress Notes (Signed)
[name]    [id number]    28-Aug-1940  Primary Care Physician:Jose Larose Kells, MD  Referring Physician: Colon Branch, MD [address] [place], [place] [address]  Chief complaint:  GERD HPI:   74 year old white female, former patient of Dr. Sydell Axon Brodie's who is here for follow-up after recent EGD and colonoscopy.  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  She feels her dysphagia has improved significantly since she is taking omeprazole on consistent basis every day, no longer regurgitating or choking on solids. She complains of cough with postnasal drip for the past 2 weeks. Denies any heartburn and acid taste or reflux related symptoms. On EGD was noted to have erosive esophagitis and gastritis, biopsy were negative for H. Pylori and showed reflux related esophagitis. She did not have a ring or stricture in the esophagus and no dilation was performed. She has history of adenomatous colon polyps and colonoscopy in June 2011 with 2 polyps removed from the cecum both of which were tubular adenomas also has internal hemorrhoids. On most recent colonoscopy for sessile tubular adenomas were removed.     Outpatient Encounter Prescriptions as of 11/30/2014  Medication Sig  . aspirin 325 MG tablet Take 325 mg by mouth daily.  Marland Kitchen atenolol (TENORMIN) 50 MG tablet Take 1 tablet (50 mg total) by mouth daily.  Marland Kitchen atorvastatin (LIPITOR) 20 MG tablet TAKE 1 TABLET(20 MG) BY MOUTH DAILY  . Blood Glucose Monitoring Suppl (ONE TOUCH ULTRA SYSTEM KIT) W/DEVICE KIT 1 kit by Does not apply route once.  . cholecalciferol (VITAMIN D) 1000 UNITS tablet Take 1,000 Units by mouth daily.    . clopidogrel (PLAVIX) 75 MG tablet Take 1 tablet (75 mg total) by mouth daily.  . clorazepate (TRANXENE) 7.5 MG tablet Take 1 tablet (7.5 mg total) by mouth as needed.  . dicyclomine (BENTYL) 20 MG tablet Take 1 tablet  (20 mg total) by mouth 3 (three) times daily as needed (for abdominal cramping).  Marland Kitchen docusate sodium (COLACE) 100 MG capsule Take 100 mg by mouth 2 (two) times daily.  Marland Kitchen FLUZONE HIGH-DOSE 0.5 ML SUSY ADM 0.5ML IM UTD  . glucose blood (ONE TOUCH ULTRA TEST) test strip USE AS DIRECTED to check blood sugar 3 times per day dx code E11.65  . hydrochlorothiazide (HYDRODIURIL) 25 MG tablet TAKE 1 TABLET BY MOUTH DAILY  . HYDROcodone-acetaminophen (NORCO/VICODIN) 5-325 MG per tablet Take 1 tablet by mouth 3 (three) times daily as needed.  . insulin NPH Human (HUMULIN N,NOVOLIN N) 100 UNIT/ML injection Inject 38 units into the skin every morning and inject 20 units into the skin at bedtime.  . Insulin Pen Needle (BD PEN NEEDLE NANO U/F) 32G X 4 MM MISC Use 3 needles per day.  . insulin regular (NOVOLIN R,HUMULIN R) 100 units/mL injection Inject into the skin 3 (three) times daily before meals. Inject 12 units every morning and evening with meals and inject 16 units with lunch.  . Insulin Syringe-Needle U-100 (INSULIN SYRINGE .5CC/31GX5/16") 31G X 5/16" 0.5 ML MISC Use 5 syringes per day  . irbesartan (AVAPRO) 300 MG tablet TAKE 1 TABLET(300 MG) BY MOUTH DAILY  . metFORMIN (GLUCOPHAGE) 500 MG tablet Take 1 tablet in the AM and 2 tablets in the PM.  . Multiple Vitamin (MULTIVITAMIN) capsule Take 1 capsule by mouth daily.    Marland Kitchen neomycin-polymyxin-hydrocortisone (CORTISPORIN)  otic solution 2-3 drops in each ear three times daily  . niacin (NIASPAN) 500 MG CR tablet Take 1 tablet (500 mg total) by mouth daily.  Marland Kitchen omeprazole (PRILOSEC) 40 MG capsule Take 1 capsule (40 mg total) by mouth daily.  Marland Kitchen VICTOZA 18 MG/3ML SOPN INJECT 1.2 MG AS DIRECTED DAILY  . [DISCONTINUED] omeprazole (PRILOSEC) 40 MG capsule Take 1 capsule (40 mg total) by mouth daily. (Patient taking differently: Take 40 mg by mouth as needed. )   No facility-administered encounter medications on file as of 11/30/2014.    Allergies as of 11/30/2014  - Review Complete 11/30/2014  Allergen Reaction Noted  . Aleve [naproxen sodium]  10/01/2014  . Codeine    . Ibuprofen    . Meperidine hcl    . Naproxen sodium      Past Medical History  Diagnosis Date  . Hypertension   . CAD (coronary artery disease)     Dr Gwenlyn Found  . Venous insufficiency   . Hyperlipidemia   . Diabetes mellitus   . GERD (gastroesophageal reflux disease)   . Gastritis   . Hx of colonic polyps   . 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 Ventral hernia   . DJD (degenerative joint disease)   . Headache(784.0)   . Depression   . History of shingles   . History of anemia   . Vitamin B12 deficiency   . 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[facility])     as a child    Past Surgical History  Procedure Laterality Date  . Cabg x 5  1997  . Cesarean section    . Knee surgery Left 1996    11-1994 and revision 12-1994 (infex, hardware removed )  . Tonsillectomy    . Tubal ligation    . 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 March Rummage  . Lap ventral hernia repair with incarcerated colon  09/2008    Dr. Lucia Gaskins  . Rotator cuff repair Right 1997    Family History  Problem Relation Age of Onset  . Heart disease Mother 47  . Heart disease Father 45    MI  . Hypertension Child   . Colon cancer Neg Hx   . Breast cancer Neg Hx   . Heart disease Maternal Aunt     x 2, all deceased  . Heart disease Maternal Uncle     x 4, all deceased  . Emphysema Brother 44    Social History   Social History  . Marital Status: Widowed    Spouse Name: N/A  . Number of Children: 2  . Years of Education: N/A   Occupational History  . retired-- [employer]    .     Social History Main Topics  . Smoking status: Never Smoker   . Smokeless tobacco: Never Used     Comment: NEVER SMOKED  . Alcohol Use: No  . Drug Use: No  . Sexual Activity: Not on file     Other Topics Concern  . Not on file   Social History Narrative   Lives by herself, lost husband ~ 2004      Review of systems: Review of Systems  Constitutional: Negative for fever and chills.  HENT: post nasal drip, cough  + Eyes: Negative for blurred vision.  Respiratory: Negative for  shortness of breath and wheezing.   Cardiovascular:  Negative for chest pain and palpitations.  Gastrointestinal: as per HPI Genitourinary: Negative for dysuria, urgency, frequency and hematuria.  Musculoskeletal: Negative for myalgias, back pain and joint pain.  Skin: Negative for itching and rash.  Neurological: Negative for dizziness, tremors, focal weakness, seizures and loss of consciousness.  Endo/Heme/Allergies: Negative for environmental allergies.  Psychiatric/Behavioral: Negative for depression, suicidal ideas and hallucinations.  All other systems reviewed and are negative.   Physical Exam: Filed Vitals:   11/30/14 1340  BP: 150/80  Pulse: 80   Gen:      No acute distress HEENT:  EOMI, sclera anicteric Neck:     No masses; no thyromegaly Lungs:    Clear to auscultation bilaterally; normal respiratory effort CV:         Regular rate and rhythm; no murmurs Abd:      + bowel sounds; soft, non-tender; no palpable masses, no distension Ext:    No edema; adequate peripheral perfusion Skin:      Warm and dry; no rash Neuro: alert and oriented x 3 Psych: normal mood and affect  Data Reviewed:  As per HPI   Assessment and Plan/Recommendations:  74 year old white female with history of small bowel adenocarcinoma status post resection and chemotherapy here for follow-up visit Dysphagia improved with once daily PPI, will hold off esophageal manometry for now Advised patient to continue omeprazole 40 mg daily, 30 minutes before breakfast Avoid meals 3 hours before bedtime and sleep with head end elevation She is due for recall colonoscopy in 3 years  Return in 3  months      K. Denzil Magnuson , MD [phone] Mon-Fri 8a-5p [phone] after 5p, weekends, holidays

## 2014-11-30 NOTE — Patient Instructions (Signed)
Follow up in 3 months We will refill your omeprazole today

## 2014-12-01 ENCOUNTER — Encounter: Payer: Self-pay | Admitting: Internal Medicine

## 2014-12-01 ENCOUNTER — Ambulatory Visit (INDEPENDENT_AMBULATORY_CARE_PROVIDER_SITE_OTHER): Payer: Medicare Other | Admitting: Internal Medicine

## 2014-12-01 VITALS — BP 132/84 | HR 83 | Temp 97.5°F | Ht 65.25 in | Wt 165.5 lb

## 2014-12-01 DIAGNOSIS — I2510 Atherosclerotic heart disease of native coronary artery without angina pectoris: Secondary | ICD-10-CM

## 2014-12-01 DIAGNOSIS — J0100 Acute maxillary sinusitis, unspecified: Secondary | ICD-10-CM

## 2014-12-01 DIAGNOSIS — Z09 Encounter for follow-up examination after completed treatment for conditions other than malignant neoplasm: Secondary | ICD-10-CM | POA: Diagnosis not present

## 2014-12-01 MED ORDER — AMOXICILLIN 500 MG PO CAPS: 1000.0000 mg | ORAL_CAPSULE | Freq: Two times a day (BID) | ORAL | Status: DC

## 2014-12-01 MED ORDER — HYDROCODONE-ACETAMINOPHEN 5-325 MG PO TABS: 1.0000 | ORAL_TABLET | Freq: Three times a day (TID) | ORAL | Status: DC | PRN

## 2014-12-01 MED ORDER — FLUTICASONE PROPIONATE 50 MCG/ACT NA SUSP: 2.0000 | Freq: Every day | NASAL | Status: DC

## 2014-12-01 MED ORDER — AZELASTINE HCL 0.1 % NA SOLN: 2.0000 | Freq: Every evening | NASAL | Status: DC | PRN

## 2014-12-01 NOTE — Progress Notes (Signed)
Pre visit review using our clinic review tool, if applicable. No additional management support is needed unless otherwise documented below in the visit note. 

## 2014-12-01 NOTE — Progress Notes (Signed)
Subjective:    Patient ID: Franklin, female    DOB: [birth year], 74 y.o.   MRN: [id number]  DOS:  12/01/2014 Type of visit - description : Acute Interval history: Symptoms started a few days ago with cough, is getting more intense, persistent to the point that she can't stop it and can't sleep. Also having sinus congestion and a lot of postnasal dripping, she thinks they dripping is making the cough worse. Mild relief with Robitussin . + Sore throat.    Review of Systems No fever or chills No chest pain Nausea only with the spells of cough, no vomiting. Some shortness of breath with cough as well.  Past Medical History  Diagnosis Date  . Hypertension   . CAD (coronary artery disease)     Dr Gwenlyn Found  . Venous insufficiency   . Hyperlipidemia   . Diabetes mellitus   . GERD (gastroesophageal reflux disease)   . Gastritis   . Hx of colonic polyps   . 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 Ventral hernia   . DJD (degenerative joint disease)   . Headache(784.0)   . Depression   . History of shingles   . History of anemia   . Vitamin B12 deficiency   . 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 Cabg x 5  1997  . Cesarean section    . Knee surgery Left 1996    11-1994 and revision 12-1994 (infex, hardware removed )  . Tonsillectomy    . Tubal ligation    . 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 March Rummage  . Lap ventral hernia repair with incarcerated colon  09/2008    Dr. Lucia Gaskins  . Rotator cuff repair Right 1997    Social History   Social History  . Marital Status: Widowed    Spouse Name: N/A  . Number of Children: 2  . Years of Education: N/A   Occupational History  . retired-- [employer]    .     Social History Main Topics  . Smoking status: Never  Smoker   . Smokeless tobacco: Never Used     Comment: NEVER SMOKED  . Alcohol Use: No  . Drug Use: No  . Sexual Activity: Not on file   Other Topics Concern  . Not on file   Social History Narrative   Lives by herself, lost husband ~ 2004        Medication List       This list is accurate as of: 12/01/14 11:59 PM.  Always use your most recent med list.               amoxicillin 500 MG capsule  Commonly known as:  AMOXIL  Take 2 capsules (1,000 mg total) by mouth 2 (two) times daily.     aspirin 325 MG tablet  Take 325 mg by mouth daily.     atenolol 50 MG tablet  Commonly known as:  TENORMIN  Take 1 tablet (50 mg total) by mouth daily.     atorvastatin 20 MG tablet  Commonly known as:  LIPITOR  TAKE 1 TABLET(20 MG) BY MOUTH DAILY     azelastine 0.1 % nasal spray  Commonly known as:  ASTELIN  Place 2 sprays  into both nostrils at bedtime as needed for rhinitis. Use in each nostril as directed     cholecalciferol 1000 UNITS tablet  Commonly known as:  VITAMIN D  Take 1,000 Units by mouth daily.     clopidogrel 75 MG tablet  Commonly known as:  PLAVIX  Take 1 tablet (75 mg total) by mouth daily.     clorazepate 7.5 MG tablet  Commonly known as:  TRANXENE  Take 1 tablet (7.5 mg total) by mouth as needed.     dicyclomine 20 MG tablet  Commonly known as:  BENTYL  Take 1 tablet (20 mg total) by mouth 3 (three) times daily as needed (for abdominal cramping).     docusate sodium 100 MG capsule  Commonly known as:  COLACE  Take 100 mg by mouth 2 (two) times daily.     fluticasone 50 MCG/ACT nasal spray  Commonly known as:  FLONASE  Place 2 sprays into both nostrils daily.     glucose blood test strip  Commonly known as:  ONE TOUCH ULTRA TEST  USE AS DIRECTED to check blood sugar 3 times per day dx code E11.65     hydrochlorothiazide 25 MG tablet  Commonly known as:  HYDRODIURIL  TAKE 1 TABLET BY MOUTH DAILY     HYDROcodone-acetaminophen 5-325 MG tablet   Commonly known as:  NORCO/VICODIN  Take 1 tablet by mouth 3 (three) times daily as needed.     insulin NPH Human 100 UNIT/ML injection  Commonly known as:  HUMULIN N,NOVOLIN N  Inject 38 units into the skin every morning and inject 20 units into the skin at bedtime.     Insulin Pen Needle 32G X 4 MM Misc  Commonly known as:  BD PEN NEEDLE NANO U/F  Use 3 needles per day.     insulin regular 100 units/mL injection  Commonly known as:  NOVOLIN R,HUMULIN R  Inject into the skin 3 (three) times daily before meals. Inject 12 units every morning and evening with meals and inject 16 units with lunch.     INSULIN SYRINGE .5CC/31GX5/16" 31G X 5/16" 0.5 ML Misc  Use 5 syringes per day     irbesartan 300 MG tablet  Commonly known as:  AVAPRO  TAKE 1 TABLET(300 MG) BY MOUTH DAILY     metFORMIN 500 MG tablet  Commonly known as:  GLUCOPHAGE  Take 1 tablet in the AM and 2 tablets in the PM.     multivitamin capsule  Take 1 capsule by mouth daily.     neomycin-polymyxin-hydrocortisone otic solution  Commonly known as:  CORTISPORIN  2-3 drops in each ear three times daily     niacin 500 MG CR tablet  Commonly known as:  NIASPAN  Take 1 tablet (500 mg total) by mouth daily.     omeprazole 40 MG capsule  Commonly known as:  PRILOSEC  Take 1 capsule (40 mg total) by mouth daily.     ONE TOUCH ULTRA SYSTEM KIT W/DEVICE Kit  1 kit by Does not apply route once.     VICTOZA 18 MG/3ML Sopn  Generic drug:  Liraglutide  INJECT 1.2 MG AS DIRECTED DAILY           Objective:   Physical Exam BP 132/84 mmHg  Pulse 83  Temp(Src) 97.5 F (36.4 C) (Oral)  Ht 5' 5.25" (1.657 m)  Wt 165 lb 8 oz (75.07 kg)  BMI 27.34 kg/m2  SpO2 97% General:   Well developed, well  nourished . NAD.  HEENT:  Normocephalic . Face symmetric, atraumatic. TMs normal. Nose not congested, sinuses slightly TTP throughout. Lungs:  CTA B Normal respiratory effort, no intercostal retractions, no accessory muscle  use. Frequent cough noted but again no distress Heart: RRR,  no murmur.  No pretibial edema bilaterally  Skin: Not pale. Not jaundice Neurologic:  alert & oriented X3.  Speech normal, gait appropriate for age and unassisted Psych--  Cognition and judgment appear intact.  Cooperative with normal attention span and concentration.  Behavior appropriate. No anxious or depressed appearing.      Assessment & Plan:  Assessment> DM  Dr Dwyane Dee  HTN Hyperlipidemia CRI creat ~1.4 Depression DJD -- hydrocodone  Chronic lymphocytic leukemia-2016 H/o anemia  B12 def CAD, Dr Gwenlyn Found MI 97 >> CABG, cath 12-13-2006, myoview 2012 no ischemic Venous insuff GI:  Dr Silverio Decamp ---GERD ---IBS ---Gastritis ---H/o ulcerative colitis as a child H/o shingles    PLAN Sinusitis: Persisting cough likely due to sinusitis, she also has abundant postnasal dripping per history. Plan: see AVS

## 2014-12-01 NOTE — Patient Instructions (Signed)
Rest, fluids , tylenol  For cough:  Take Mucinex DM twice a day as needed until better If the cough is persistent, okay to take hydrocodone which not only help with pain but also with cough  For nasal congestion Use OTC   Flonase : 2 nasal sprays on each side of the nose daily in the morning until you feel better Also use Astelin 2 sprays in each side of the nose every night     Take the antibiotic as prescribed  (Amoxicillin)  Call if not gradually better over the next  10 days  Call anytime if the symptoms are severe

## 2014-12-02 DIAGNOSIS — Z09 Encounter for follow-up examination after completed treatment for conditions other than malignant neoplasm: Secondary | ICD-10-CM | POA: Insufficient documentation

## 2014-12-02 NOTE — Assessment & Plan Note (Signed)
Sinusitis: Persisting cough likely due to sinusitis, she also has abundant postnasal dripping per history. Plan: see AVS

## 2014-12-07 ENCOUNTER — Encounter: Payer: Self-pay | Admitting: Internal Medicine

## 2014-12-11 DIAGNOSIS — M65331 Trigger finger, right middle finger: Secondary | ICD-10-CM | POA: Diagnosis not present

## 2014-12-11 DIAGNOSIS — M65332 Trigger finger, left middle finger: Secondary | ICD-10-CM | POA: Diagnosis not present

## 2014-12-11 DIAGNOSIS — M720 Palmar fascial fibromatosis [Dupuytren]: Secondary | ICD-10-CM | POA: Diagnosis not present

## 2014-12-11 DIAGNOSIS — M65341 Trigger finger, right ring finger: Secondary | ICD-10-CM | POA: Diagnosis not present

## 2014-12-15 ENCOUNTER — Other Ambulatory Visit: Payer: Self-pay | Admitting: Internal Medicine

## 2014-12-16 ENCOUNTER — Other Ambulatory Visit: Payer: Self-pay | Admitting: Endocrinology

## 2014-12-17 ENCOUNTER — Other Ambulatory Visit (INDEPENDENT_AMBULATORY_CARE_PROVIDER_SITE_OTHER): Payer: Medicare Other

## 2014-12-17 DIAGNOSIS — IMO0002 Reserved for concepts with insufficient information to code with codable children: Secondary | ICD-10-CM

## 2014-12-17 DIAGNOSIS — E1165 Type 2 diabetes mellitus with hyperglycemia: Secondary | ICD-10-CM | POA: Diagnosis not present

## 2014-12-17 DIAGNOSIS — N289 Disorder of kidney and ureter, unspecified: Secondary | ICD-10-CM | POA: Diagnosis not present

## 2014-12-17 LAB — HEMOGLOBIN A1C: Hgb A1c MFr Bld: 6.5 % (ref 4.6–6.5)

## 2014-12-17 LAB — BASIC METABOLIC PANEL
BUN: 26 mg/dL — AB (ref 6–23)
CALCIUM: 9.3 mg/dL (ref 8.4–10.5)
CO2: 30 meq/L (ref 19–32)
Chloride: 105 mEq/L (ref 96–112)
Creatinine, Ser: 1.46 mg/dL — ABNORMAL HIGH (ref 0.40–1.20)
GFR: 37.09 mL/min — AB (ref >60.00)
GLUCOSE: 143 mg/dL — AB (ref 70–99)
POTASSIUM: 4.4 meq/L (ref 3.5–5.1)
SODIUM: 140 meq/L (ref 135–145)

## 2014-12-17 NOTE — Progress Notes (Signed)
Quick Note:  Please let patient know that the kidney test is about the same, stay on same medications ______

## 2014-12-31 ENCOUNTER — Telehealth: Payer: Self-pay | Admitting: Internal Medicine

## 2014-12-31 NOTE — Telephone Encounter (Signed)
Scheduled appt for patient next week

## 2014-12-31 NOTE — Telephone Encounter (Signed)
Pt last seen 1 month ago, if Sxs persisting will need to be seen again.

## 2014-12-31 NOTE — Telephone Encounter (Signed)
Caller name: Self   Can be reached: [phone]  Pharmacy:  [id number] - [place], [place] [phone] (Phone) [phone] (Fax)         Reason for call: Patient called requesting a cough medication. States she has taken the rx that was given to her by Dr. Larose Kells 11/29 but she still has a severe cough

## 2015-01-03 DIAGNOSIS — I209 Angina pectoris, unspecified: Secondary | ICD-10-CM

## 2015-01-03 HISTORY — DX: Angina pectoris, unspecified: I20.9

## 2015-01-07 ENCOUNTER — Ambulatory Visit: Payer: Medicare Other | Admitting: Medical

## 2015-01-16 ENCOUNTER — Other Ambulatory Visit: Payer: Self-pay | Admitting: Cardiovascular Disease

## 2015-02-05 ENCOUNTER — Other Ambulatory Visit: Payer: Self-pay | Admitting: Endocrinology

## 2015-02-16 ENCOUNTER — Other Ambulatory Visit (INDEPENDENT_AMBULATORY_CARE_PROVIDER_SITE_OTHER): Payer: Medicare Other

## 2015-02-16 DIAGNOSIS — E1165 Type 2 diabetes mellitus with hyperglycemia: Secondary | ICD-10-CM | POA: Diagnosis not present

## 2015-02-16 DIAGNOSIS — IMO0002 Reserved for concepts with insufficient information to code with codable children: Secondary | ICD-10-CM

## 2015-02-16 LAB — COMPREHENSIVE METABOLIC PANEL
ALBUMIN: 4.1 g/dL (ref 3.5–5.2)
ALK PHOS: 88 U/L (ref 39–117)
ALT: 15 U/L (ref 0–35)
AST: 19 U/L (ref 0–37)
BUN: 23 mg/dL (ref 6–23)
CO2: 28 mEq/L (ref 19–32)
CREATININE: 1.32 mg/dL — AB (ref 0.40–1.20)
Calcium: 9.5 mg/dL (ref 8.4–10.5)
Chloride: 106 mEq/L (ref 96–112)
GFR: 41.65 mL/min — ABNORMAL LOW (ref >60.00)
Glucose, Bld: 118 mg/dL — ABNORMAL HIGH (ref 70–99)
Potassium: 4.2 mEq/L (ref 3.5–5.1)
SODIUM: 142 meq/L (ref 135–145)
TOTAL PROTEIN: 7.1 g/dL (ref 6.0–8.3)
Total Bilirubin: 0.3 mg/dL (ref 0.2–1.2)

## 2015-02-17 DIAGNOSIS — H43813 Vitreous degeneration, bilateral: Secondary | ICD-10-CM | POA: Diagnosis not present

## 2015-02-17 DIAGNOSIS — E113212 Type 2 diabetes mellitus with mild nonproliferative diabetic retinopathy with macular edema, left eye: Secondary | ICD-10-CM | POA: Diagnosis not present

## 2015-02-17 DIAGNOSIS — H35432 Paving stone degeneration of retina, left eye: Secondary | ICD-10-CM | POA: Diagnosis not present

## 2015-02-17 DIAGNOSIS — E113291 Type 2 diabetes mellitus with mild nonproliferative diabetic retinopathy without macular edema, right eye: Secondary | ICD-10-CM | POA: Diagnosis not present

## 2015-02-19 ENCOUNTER — Encounter: Payer: Self-pay | Admitting: Endocrinology

## 2015-02-19 ENCOUNTER — Ambulatory Visit (INDEPENDENT_AMBULATORY_CARE_PROVIDER_SITE_OTHER): Payer: Medicare Other | Admitting: Endocrinology

## 2015-02-19 VITALS — BP 124/76 | HR 73 | Temp 97.9°F | Resp 16 | Ht 65.25 in | Wt 169.2 lb

## 2015-02-19 DIAGNOSIS — Z794 Long term (current) use of insulin: Secondary | ICD-10-CM | POA: Diagnosis not present

## 2015-02-19 DIAGNOSIS — E1165 Type 2 diabetes mellitus with hyperglycemia: Secondary | ICD-10-CM | POA: Diagnosis not present

## 2015-02-19 NOTE — Progress Notes (Signed)
Patient ID: [name], female   DOB: [birth year], 75 y.o.   MRN: [id number]            Reason for Appointment: Followup for Type 2 Diabetes  Referring physician: Larose Kells  History of Present Illness:          Diagnosis: Type 2 diabetes mellitus, date of diagnosis: 1998       Past history:   She was treated with metformin at diagnosis when this had been continued until 2015 Subsequently Amaryl was also added several years ago and this has been continued Her blood sugars were under fair control between 2010 and early 2013 with A1c ranging from 7.4-9.4, mostly under 8% However since 08/2011 her A1c has been mostly over 8%  Insulin was added in 2014 with small doses of Lantus and this has been progressively increased She was started on mealtime insulin on her initial consultation in 9/15 because of high postprandial readings, sometimes over 300 With adding Victoza in 02/2014 her blood sugars were somewhat better with A1c coming down below 8%  Recent history:   INSULIN regimen: Regular 14--16 units before meals.  NPH 38 units in the morning and 20 at night Non-insulin hypoglycemic drugs the patient is taking are:  Metformin 500 milligrams twice a day and Victoza 1.2 mg daily   Her blood sugars are fairly well controlled with current regimen above Metformin was reduced on her last visit because of cracking clearance of 37 A1c has been consistently under 7  Current blood sugar patterns, daily management and problems identified:  Fasting blood sugars: These are still somewhat high but inconsistent  Postprandial readings: she has only 2 glucose readings in the evenings after supper and these look fairly good Most of her sugars are being checked before supper, these are  occasionally high and she does not know why Has a few readings late morning which are not significantly high Mealtime insulin: She is taking 2 units less at breakfast since her last visit No coverage for lunch No  symptomatic hypoglycemia  She has gained weight recently      Side effects from medications have been: None Compliance with the medical regimen: good, she is taking her insulin consistently before meals   Glucose monitoring:  done 1-2 times a day         Glucometer: One Touch.      Blood Glucose readings by time of day from download:   Mean values apply above for all meters except median for One Touch  PRE-MEAL Fasting Lunch Dinner Bedtime Overall  Glucose range: 126-183   80-275     Mean/median: 136   134   141   POST-MEAL PC Breakfast PC Lunch PC Dinner  Glucose range:  142, 145    149, 163   Mean/median:       Self-care: The diet that the patient has been following is: none, usually eating low fat meals Meals: 2-3 meals per day at irregular times. Dinner 6-7 pm Breakfast is a poptart but sometimes oatmeal at 10 am, usually eating sandwiches for meals, snacks will be with crackers         Dietician visit, most recent: never.  She saw the CDE in 02/2014              Exercise:  less recently, is walking when it is warm  Weight history: 150-170 previously  Wt Readings from Last 3 Encounters:  02/19/15 169 lb 3.2 oz (76.749 kg)  12/01/14 165  lb 8 oz (75.07 kg)  11/30/14 164 lb 8 oz (74.617 kg)    Glycemic control:   Lab Results  Component Value Date   HGBA1C 6.5 12/17/2014   HGBA1C 6.6* 11/16/2014   HGBA1C 7.2 08/19/2014   Lab Results  Component Value Date   MICROALBUR 2.8* 11/16/2014   LDLCALC 40 07/22/2014   CREATININE 1.32* 02/16/2015    OTHER active problems  discussed in review of systems     Medication List       This list is accurate as of: 02/19/15  9:46 AM.  Always use your most recent med list.               amoxicillin 500 MG capsule  Commonly known as:  AMOXIL  Take 2 capsules (1,000 mg total) by mouth 2 (two) times daily.     aspirin 325 MG tablet  Take 325 mg by mouth daily.     atenolol 50 MG tablet  Commonly known as:  TENORMIN    Take 1 tablet (50 mg total) by mouth daily.     atorvastatin 20 MG tablet  Commonly known as:  LIPITOR  TAKE 1 TABLET(20 MG) BY MOUTH DAILY     azelastine 0.1 % nasal spray  Commonly known as:  ASTELIN  Place 2 sprays into both nostrils at bedtime as needed for rhinitis. Use in each nostril as directed     cholecalciferol 1000 units tablet  Commonly known as:  VITAMIN D  Take 1,000 Units by mouth daily.     clopidogrel 75 MG tablet  Commonly known as:  PLAVIX  Take 1 tablet (75 mg total) by mouth daily.     clorazepate 7.5 MG tablet  Commonly known as:  TRANXENE  Take 1 tablet (7.5 mg total) by mouth as needed.     dicyclomine 20 MG tablet  Commonly known as:  BENTYL  Take 1 tablet (20 mg total) by mouth 3 (three) times daily as needed (for abdominal cramping).     docusate sodium 100 MG capsule  Commonly known as:  COLACE  Take 100 mg by mouth 2 (two) times daily.     fluticasone 50 MCG/ACT nasal spray  Commonly known as:  FLONASE  Place 2 sprays into both nostrils daily.     hydrochlorothiazide 25 MG tablet  Commonly known as:  HYDRODIURIL  TAKE 1 TABLET BY MOUTH DAILY     HYDROcodone-acetaminophen 5-325 MG tablet  Commonly known as:  NORCO/VICODIN  Take 1 tablet by mouth 3 (three) times daily as needed.     insulin NPH Human 100 UNIT/ML injection  Commonly known as:  HUMULIN N,NOVOLIN N  Inject 38 units into the skin every morning and inject 20 units into the skin at bedtime.     Insulin Pen Needle 32G X 4 MM Misc  Commonly known as:  BD PEN NEEDLE NANO U/F  Use 3 needles per day.     insulin regular 100 units/mL injection  Commonly known as:  NOVOLIN R,HUMULIN R  Inject into the skin 3 (three) times daily before meals. Inject 12 units every morning and evening with meals and inject 16 units with lunch.     INSULIN SYRINGE .5CC/31GX5/16" 31G X 5/16" 0.5 ML Misc  Use 5 syringes per day     irbesartan 300 MG tablet  Commonly known as:  AVAPRO  TAKE 1  TABLET BY MOUTH DAILY     metFORMIN 500 MG tablet  Commonly known as:  GLUCOPHAGE  Take 1 tablet in the AM and 2 tablets in the PM.     multivitamin capsule  Take 1 capsule by mouth daily.     neomycin-polymyxin-hydrocortisone otic solution  Commonly known as:  CORTISPORIN  2-3 drops in each ear three times daily     niacin 500 MG CR tablet  Commonly known as:  NIASPAN  Take 1 tablet (500 mg total) by mouth daily.     omeprazole 40 MG capsule  Commonly known as:  PRILOSEC  Take 1 capsule (40 mg total) by mouth daily.     ONE TOUCH ULTRA SYSTEM KIT w/Device Kit  1 kit by Does not apply route once.     ONE TOUCH ULTRA TEST test strip  Generic drug:  glucose blood  USE TO CHECK BLOOD SUGARS TWICE DAILY AS DIRECTED     VICTOZA 18 MG/3ML Sopn  Generic drug:  Liraglutide  INJECT 1.2MG AS DIRECTED EVERY DAY        Allergies:  Allergies  Allergen Reactions  . Aleve [Naproxen Sodium]     Or Advil  . Codeine     REACTION: makes her nervous, orTylenol #3  . Ibuprofen     REACTION: nervous  . Meperidine Hcl     REACTION: nasuea and vomitting  . Naproxen Sodium     REACTION: nervous    Past Medical History  Diagnosis Date  . Hypertension   . CAD (coronary artery disease)     Dr Gwenlyn Found  . Venous insufficiency   . Hyperlipidemia   . Diabetes mellitus   . GERD (gastroesophageal reflux disease)   . Gastritis   . Hx of colonic polyps   . 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 Ventral hernia   . DJD (degenerative joint disease)   . Headache(784.0)   . Depression   . History of shingles   . History of anemia   . Vitamin B12 deficiency   . CLL (chronic lymphocytic leukemia) ([place]) 02/25/2014  . 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 Cabg x 5  1997  . Cesarean section    . Knee surgery Left 1996    11-1994 and revision 12-1994  (infex, hardware removed )  . Tonsillectomy    . Tubal ligation    . 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 March Rummage  . Lap ventral hernia repair with incarcerated colon  09/2008    Dr. Lucia Gaskins  . Rotator cuff repair Right 1997    Family History  Problem Relation Age of Onset  . Heart disease Mother 67  . Heart disease Father 53    MI  . Hypertension Child   . Colon cancer Neg Hx   . Breast cancer Neg Hx   . Heart disease Maternal Aunt     x 2, all deceased  . Heart disease Maternal Uncle     x 4, all deceased  . Emphysema Brother 62    Social History:  reports that she has never smoked. She has never used smokeless tobacco. She reports that she does not drink alcohol or use illicit drugs.    Review of Systems        Lipids: Has had excellent control of hypercholesterolemia with long-term use of Crestor She has a history of coronary bypass surgery.   Also has  been  on Niaspan by PCP       Lab Results  Component Value Date   CHOL 126 07/22/2014   HDL 45* 07/22/2014   LDLCALC 40 07/22/2014   LDLDIRECT 43.1 05/08/2012   TRIG 205* 07/22/2014   CHOLHDL 2.8 07/22/2014                  The blood pressure has been  well controlled  and she on 25 mg HCTZ, 150 mg Avapro, and atenolol.  No hypokalemia  Lab Results  Component Value Date   CREATININE 1.32* 02/16/2015   BUN 23 02/16/2015   NA 142 02/16/2015   K 4.2 02/16/2015   CL 106 02/16/2015   CO2 28 02/16/2015    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 creatinine was relatively higher previously but appears to be better with reducing Avapro down to 150 mg    Lab Results  Component Value Date   CREATININE 1.32* 02/16/2015   CREATININE 1.46* 12/17/2014   CREATININE 1.49* 11/16/2014       No history of Numbness, tingling or burning in feet but occasional tingling in her left leg which has had severe injuries in the past  Diabetic foot exam in 9/15 showed:  Decreased monofilament in toes and plantar surfaces, skin exam is  normal. Pedal pulses: absent left DP  She has had some laser treatments to her eyes, possibly from regular edema  Physical Examination:  BP 124/76 mmHg  Pulse 73  Temp(Src) 97.9 F (36.6 C)  Resp 16  Ht 5' 5.25" (1.657 m)  Wt 169 lb 3.2 oz (76.749 kg)  BMI 27.95 kg/m2  SpO2 96%  Diabetes type 2, uncontrolled with BMI 27   See history of present illness for detailed discussion of her current blood sugar patterns, management and problems identified  Although she has not had an A1c on this visit her blood sugars overall look similar to her last visit Has relatively higher fasting readings at times but this may improve with increasing her metformin back again  She has been checking readings mostly at breakfast and supper and not enough post prandial readings  RENAL insufficiency: Her creatinine is better with reducing Avapro to 150 mg   PLAN:  Increased metformin to 1500 mg No change in insulin More readings after supper Increase exercise as tolerated Continue Victoza 1.2 mg  Patient Instructions  Metformin 1 in am and 2 at dinner  Check blood sugars on waking up   times a week Also check blood sugars about 2 hours after a meal and do this after different meals by rotation  Recommended blood sugar levels on waking up is 90-130 and about 2 hours after meal is 130-160  Please bring your blood sugar monitor to each visit, thank you      [facility] 02/19/2015, 9:46 AM   Note: This office note was prepared with Estate agent. Any transcriptional errors that result from this process are unintentional.

## 2015-02-19 NOTE — Patient Instructions (Signed)
Metformin 1 in am and 2 at dinner  Check blood sugars on waking up   times a week Also check blood sugars about 2 hours after a meal and do this after different meals by rotation  Recommended blood sugar levels on waking up is 90-130 and about 2 hours after meal is 130-160  Please bring your blood sugar monitor to each visit, thank you

## 2015-02-24 ENCOUNTER — Other Ambulatory Visit (HOSPITAL_BASED_OUTPATIENT_CLINIC_OR_DEPARTMENT_OTHER): Payer: Medicare Other

## 2015-02-24 ENCOUNTER — Ambulatory Visit (INDEPENDENT_AMBULATORY_CARE_PROVIDER_SITE_OTHER): Payer: Medicare Other | Admitting: Internal Medicine

## 2015-02-24 ENCOUNTER — Encounter: Payer: Self-pay | Admitting: Hematology & Oncology

## 2015-02-24 ENCOUNTER — Encounter: Payer: Self-pay | Admitting: Internal Medicine

## 2015-02-24 ENCOUNTER — Ambulatory Visit (HOSPITAL_BASED_OUTPATIENT_CLINIC_OR_DEPARTMENT_OTHER): Payer: Medicare Other | Admitting: Hematology & Oncology

## 2015-02-24 VITALS — BP 149/70 | HR 69 | Temp 97.7°F | Resp 18 | Ht 65.0 in | Wt 167.0 lb

## 2015-02-24 VITALS — BP 128/76 | HR 81 | Temp 97.5°F | Ht 65.25 in | Wt 167.5 lb

## 2015-02-24 DIAGNOSIS — I10 Essential (primary) hypertension: Secondary | ICD-10-CM

## 2015-02-24 DIAGNOSIS — D72829 Elevated white blood cell count, unspecified: Secondary | ICD-10-CM

## 2015-02-24 DIAGNOSIS — F418 Other specified anxiety disorders: Secondary | ICD-10-CM

## 2015-02-24 DIAGNOSIS — C9110 Chronic lymphocytic leukemia of B-cell type not having achieved remission: Secondary | ICD-10-CM

## 2015-02-24 DIAGNOSIS — M150 Primary generalized (osteo)arthritis: Secondary | ICD-10-CM

## 2015-02-24 DIAGNOSIS — F329 Major depressive disorder, single episode, unspecified: Secondary | ICD-10-CM

## 2015-02-24 DIAGNOSIS — F419 Anxiety disorder, unspecified: Secondary | ICD-10-CM

## 2015-02-24 DIAGNOSIS — Z09 Encounter for follow-up examination after completed treatment for conditions other than malignant neoplasm: Secondary | ICD-10-CM

## 2015-02-24 DIAGNOSIS — M159 Polyosteoarthritis, unspecified: Secondary | ICD-10-CM

## 2015-02-24 LAB — CBC WITH DIFFERENTIAL (CANCER CENTER ONLY)
BASO#: 0 10*3/uL (ref 0.0–0.2)
BASO%: 0.2 % (ref 0.0–2.0)
EOS ABS: 0.2 10*3/uL (ref 0.0–0.5)
EOS%: 1.1 % (ref 0.0–7.0)
HCT: 34.8 % (ref 34.8–46.6)
HGB: 11.7 g/dL (ref 11.6–15.9)
LYMPH#: 13.6 10*3/uL — ABNORMAL HIGH (ref 0.9–3.3)
LYMPH%: 70.8 % — AB (ref 14.0–48.0)
MCH: 31.5 pg (ref 26.0–34.0)
MCHC: 33.6 g/dL (ref 32.0–36.0)
MCV: 94 fL (ref 81–101)
MONO#: 0.7 10*3/uL (ref 0.1–0.9)
MONO%: 3.6 % (ref 0.0–13.0)
NEUT#: 4.7 10*3/uL (ref 1.5–6.5)
NEUT%: 24.3 % — AB (ref 39.6–80.0)
PLATELETS: 207 10*3/uL (ref 145–400)
RBC: 3.72 10*6/uL (ref 3.70–5.32)
RDW: 13.7 % (ref 11.1–15.7)
WBC: 19.2 10*3/uL — ABNORMAL HIGH (ref 3.9–10.0)

## 2015-02-24 LAB — COMPREHENSIVE METABOLIC PANEL
ALT: 18 U/L (ref 0–55)
ANION GAP: 13 meq/L — AB (ref 3–11)
AST: 22 U/L (ref 5–34)
Albumin: 3.6 g/dL (ref 3.5–5.0)
Alkaline Phosphatase: 104 U/L (ref 40–150)
BILIRUBIN TOTAL: 0.36 mg/dL (ref 0.20–1.20)
BUN: 18.4 mg/dL (ref 7.0–26.0)
CHLORIDE: 109 meq/L (ref 98–109)
CO2: 22 meq/L (ref 22–29)
Calcium: 9 mg/dL (ref 8.4–10.4)
Creatinine: 1.4 mg/dL — ABNORMAL HIGH (ref 0.6–1.1)
EGFR: 38 mL/min/{1.73_m2} — AB (ref >90)
Glucose: 123 mg/dl (ref 70–140)
Potassium: 3.8 mEq/L (ref 3.5–5.1)
Sodium: 144 mEq/L (ref 136–145)
TOTAL PROTEIN: 7 g/dL (ref 6.4–8.3)

## 2015-02-24 LAB — CHCC SATELLITE - SMEAR

## 2015-02-24 LAB — TECHNOLOGIST REVIEW CHCC SATELLITE

## 2015-02-24 MED ORDER — HYDROCODONE-ACETAMINOPHEN 5-325 MG PO TABS: 1.0000 | ORAL_TABLET | Freq: Three times a day (TID) | ORAL | Status: DC | PRN

## 2015-02-24 NOTE — Patient Instructions (Signed)
GO TO THE FRONT DESK  Schedule a complete physical exam to be done in 6 months  Please be fasting

## 2015-02-24 NOTE — Progress Notes (Signed)
Hematology and Oncology Follow Up Visit  Cochran [id number] 1940/02/13 74 y.o. 02/24/2015   Principle Diagnosis:   CLL -stage A  Remote history of colon cancer  Current Therapy:    Observation     Interim History:  [name] is back for follow-up. She is doing okay. She's gained a little bit of weight. She try to watch out for her blood sugars.  She's had no problems with fever. She did have a bowel type syndrome after Thanksgiving. This lasted probably for about 4 or 5 weeks. She is feeling much better.   There's been no bleeding. She is on Plavix. She's not noted any bruising.  There's been no change in bowel or bladder habits.  She's not noted any swollen lymph glands.  Overall, her performance status is ECOG 1. Medications:  Current outpatient prescriptions:  .  aspirin 325 MG tablet, Take 325 mg by mouth daily., Disp: , Rfl:  .  atenolol (TENORMIN) 50 MG tablet, Take 1 tablet (50 mg total) by mouth daily., Disp: 90 tablet, Rfl: 2 .  atorvastatin (LIPITOR) 20 MG tablet, TAKE 1 TABLET(20 MG) BY MOUTH DAILY, Disp: 30 tablet, Rfl: 6 .  azelastine (ASTELIN) 0.1 % nasal spray, Place 2 sprays into both nostrils at bedtime as needed for rhinitis. Use in each nostril as directed, Disp: 30 mL, Rfl: 3 .  Blood Glucose Monitoring Suppl (ONE TOUCH ULTRA SYSTEM KIT) W/DEVICE KIT, 1 kit by Does not apply route once., Disp: 1 each, Rfl: 0 .  cholecalciferol (VITAMIN D) 1000 UNITS tablet, Take 1,000 Units by mouth daily.  , Disp: , Rfl:  .  clopidogrel (PLAVIX) 75 MG tablet, Take 1 tablet (75 mg total) by mouth daily., Disp: 90 tablet, Rfl: 2 .  clorazepate (TRANXENE) 7.5 MG tablet, Take 1 tablet (7.5 mg total) by mouth as needed., Disp: 30 tablet, Rfl: 5 .  dicyclomine (BENTYL) 20 MG tablet, Take 1 tablet (20 mg total) by mouth 3 (three) times daily as needed (for abdominal cramping)., Disp: 50 tablet, Rfl: 5 .  docusate sodium (COLACE) 100 MG capsule, Take 100 mg by mouth 2  (two) times daily., Disp: , Rfl:  .  fluticasone (FLONASE) 50 MCG/ACT nasal spray, Place 2 sprays into both nostrils daily., Disp: 16 g, Rfl: 3 .  hydrochlorothiazide (HYDRODIURIL) 25 MG tablet, TAKE 1 TABLET BY MOUTH DAILY, Disp: 90 tablet, Rfl: 1 .  HYDROcodone-acetaminophen (NORCO/VICODIN) 5-325 MG tablet, Take 1 tablet by mouth 3 (three) times daily as needed., Disp: 90 tablet, Rfl: 0 .  insulin NPH Human (HUMULIN N,NOVOLIN N) 100 UNIT/ML injection, Inject 38 units into the skin every morning and inject 20 units into the skin at bedtime., Disp: , Rfl:  .  Insulin Pen Needle (BD PEN NEEDLE NANO U/F) 32G X 4 MM MISC, Use 3 needles per day., Disp: 100 each, Rfl: 3 .  insulin regular (NOVOLIN R,HUMULIN R) 100 units/mL injection, Inject into the skin 3 (three) times daily before meals. Inject 12 units every morning and evening with meals and inject 16 units with lunch., Disp: , Rfl:  .  Insulin Syringe-Needle U-100 (INSULIN SYRINGE .5CC/31GX5/16") 31G X 5/16" 0.5 ML MISC, Use 5 syringes per day, Disp: 150 each, Rfl: 3 .  irbesartan (AVAPRO) 300 MG tablet, TAKE 1 TABLET BY MOUTH DAILY, Disp: 90 tablet, Rfl: 1 .  metFORMIN (GLUCOPHAGE) 500 MG tablet, Take 1 tablet in the AM and 2 tablets in the PM., Disp: 270 tablet, Rfl: 1 .  Multiple Vitamin (MULTIVITAMIN) capsule, Take 1 capsule by mouth daily.  , Disp: , Rfl:  .  neomycin-polymyxin-hydrocortisone (CORTISPORIN) otic solution, 2-3 drops in each ear three times daily, Disp: 10 mL, Rfl: 2 .  niacin (NIASPAN) 500 MG CR tablet, Take 1 tablet (500 mg total) by mouth daily., Disp: 90 tablet, Rfl: 1 .  omeprazole (PRILOSEC) 40 MG capsule, Take 1 capsule (40 mg total) by mouth daily., Disp: 90 capsule, Rfl: 3 .  ONE TOUCH ULTRA TEST test strip, USE TO CHECK BLOOD SUGARS TWICE DAILY AS DIRECTED, Disp: 100 each, Rfl: 3 .  VICTOZA 18 MG/3ML SOPN, INJECT 1.2MG AS DIRECTED EVERY DAY, Disp: 6 mL, Rfl: 3  Allergies:  Allergies  Allergen Reactions  . Aleve  [Naproxen Sodium]     Or Advil  . Codeine     REACTION: makes her nervous, orTylenol #3  . Ibuprofen     REACTION: nervous  . Meperidine Hcl     REACTION: nasuea and vomitting  . Naproxen Sodium     REACTION: nervous    Past Medical History, Surgical history, Social history, and Family History were reviewed and updated.  Review of Systems: As above  Physical Exam:  height is _0  (1.651 m) and weight is 167 lb (75.751 kg). Her oral temperature is 97.7 F (36.5 C). Her blood pressure is 149/70 and her pulse is 69. Her respiration is 18.   Well-developed well-nourished white female. Head and neck exam shows no ocular or oral lesions. She has no palpable cervical or supraclavicular lymph nodes. Lungs are clear. Cardiac exam regular rate and rhythm with no murmurs, rubs or bruits. Abdomen is soft. She has good bowel sounds. There is no fluid wave. There is no palpable liver or spleen tip. She has well-healed laparotomy scar. Extremities shows no clubbing, cyanosis or edema. Skin exam no rashes, ecchymoses or petechia. She has some healing macular type lesions on her left thigh. Neurological exam is nonfocal.  Lab Results  Component Value Date   WBC 19.2* 02/24/2015   HGB 11.7 02/24/2015   HCT 34.8 02/24/2015   MCV 94 02/24/2015   PLT 207 02/24/2015     Chemistry      Component Value Date/Time   NA 142 02/16/2015 0858   NA 141 10/31/2013 1041   K 4.2 02/16/2015 0858   K 4.1 10/31/2013 1041   CL 106 02/16/2015 0858   CL 101 10/31/2013 1041   CO2 28 02/16/2015 0858   CO2 26 10/31/2013 1041   BUN 23 02/16/2015 0858   BUN 20 10/31/2013 1041   CREATININE 1.32* 02/16/2015 0858   CREATININE 1.1 10/31/2013 1041      Component Value Date/Time   CALCIUM 9.5 02/16/2015 0858   CALCIUM 9.9 10/31/2013 1041   ALKPHOS 88 02/16/2015 0858   ALKPHOS 73 10/31/2013 1041   AST 19 02/16/2015 0858   AST 27 10/31/2013 1041   ALT 15 02/16/2015 0858   ALT 27 10/31/2013 1041   BILITOT 0.3  02/16/2015 0858   BILITOT 0.60 10/31/2013 1041         Impression and Plan: [name] is 75 year old white female with CLL. She has stage A disease. She is asymptomatic. There is no indication that we have to treat her. She is not anemic or thrombocytopenic.  I think we probably get her back in 6 months. Her white cell count is up a little bit. This has been going up slowly. We will have to be careful with this.  She is on quite a few medications. She has to be careful taking all these medicines.   I don't see that would need any blood work in between visits.   [name], MD 2/22/201710:43 AM

## 2015-02-24 NOTE — Progress Notes (Signed)
Pre visit review using our clinic review tool, if applicable. No additional management support is needed unless otherwise documented below in the visit note. 

## 2015-02-24 NOTE — Progress Notes (Signed)
Subjective:    Patient ID: [place], female    DOB: Apr 30, 1940, 75 y.o.   MRN: [id number]  DOS:  02/24/2015 Type of visit - description : Routine office visit Interval history: No major concerns, feeling great. Medications reviewed, good compliance. Recent labs reviewed, all seem to be very good. No amb BPs but BP is documented in the chart are very good  BP Readings from Last 3 Encounters:  02/24/15 149/70  02/24/15 128/76  02/19/15 124/76     Review of Systems Denies chest pain or difficulty breathing. No lower extremity edema No nausea, vomiting, diarrhea. No respiratory symptoms  Past Medical History  Diagnosis Date  . Hypertension   . CAD (coronary artery disease)     Dr Gwenlyn Found  . Venous insufficiency   . Hyperlipidemia   . Diabetes mellitus   . GERD (gastroesophageal reflux disease)   . Gastritis   . Hx of colonic polyps   . 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 Ventral hernia   . DJD (degenerative joint disease)   . Headache(784.0)   . Depression   . History of shingles   . History of anemia   . Vitamin B12 deficiency   . CLL (chronic lymphocytic leukemia) ([place]) 02/25/2014  . 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 Cabg x 5  1997  . Cesarean section    . Knee surgery Left 1996    11-1994 and revision 12-1994 (infex, hardware removed )  . Tonsillectomy    . Tubal ligation    . 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 March Rummage  . Lap ventral hernia repair with incarcerated colon  09/2008    Dr. Lucia Gaskins  . Rotator cuff repair Right 1997    Social History   Social History  . Marital Status: Widowed    Spouse Name: N/A  . Number of Children: 2  . Years of Education: N/A   Occupational History  . retired-- [employer]    .     Social History Main Topics  . Smoking status: Never Smoker   .  Smokeless tobacco: Never Used     Comment: NEVER SMOKED  . Alcohol Use: No  . Drug Use: No  . Sexual Activity: Not on file   Other Topics Concern  . Not on file   Social History Narrative   Lives by herself, lost husband ~ 2004        Medication List       This list is accurate as of: 02/24/15 11:59 PM.  Always use your most recent med list.               aspirin 325 MG tablet  Take 325 mg by mouth daily.     atenolol 50 MG tablet  Commonly known as:  TENORMIN  Take 1 tablet (50 mg total) by mouth daily.     atorvastatin 20 MG tablet  Commonly known as:  LIPITOR  TAKE 1 TABLET(20 MG) BY MOUTH DAILY     azelastine 0.1 % nasal spray  Commonly known as:  ASTELIN  Place 2 sprays into both nostrils at bedtime as needed for rhinitis. Use in each nostril as directed     cholecalciferol 1000 units tablet  Commonly known as:  VITAMIN D  Take 1,000 Units by  mouth daily.     clopidogrel 75 MG tablet  Commonly known as:  PLAVIX  Take 1 tablet (75 mg total) by mouth daily.     clorazepate 7.5 MG tablet  Commonly known as:  TRANXENE  Take 1 tablet (7.5 mg total) by mouth as needed.     dicyclomine 20 MG tablet  Commonly known as:  BENTYL  Take 1 tablet (20 mg total) by mouth 3 (three) times daily as needed (for abdominal cramping).     docusate sodium 100 MG capsule  Commonly known as:  COLACE  Take 100 mg by mouth 2 (two) times daily.     fluticasone 50 MCG/ACT nasal spray  Commonly known as:  FLONASE  Place 2 sprays into both nostrils daily.     hydrochlorothiazide 25 MG tablet  Commonly known as:  HYDRODIURIL  TAKE 1 TABLET BY MOUTH DAILY     HYDROcodone-acetaminophen 5-325 MG tablet  Commonly known as:  NORCO/VICODIN  Take 1 tablet by mouth 3 (three) times daily as needed.     insulin NPH Human 100 UNIT/ML injection  Commonly known as:  HUMULIN N,NOVOLIN N  Inject 38 units into the skin every morning and inject 20 units into the skin at bedtime.      Insulin Pen Needle 32G X 4 MM Misc  Commonly known as:  BD PEN NEEDLE NANO U/F  Use 3 needles per day.     insulin regular 100 units/mL injection  Commonly known as:  NOVOLIN R,HUMULIN R  Inject into the skin 3 (three) times daily before meals. Inject 12 units every morning and evening with meals and inject 16 units with lunch.     INSULIN SYRINGE .5CC/31GX5/16" 31G X 5/16" 0.5 ML Misc  Use 5 syringes per day     irbesartan 300 MG tablet  Commonly known as:  AVAPRO  TAKE 1 TABLET BY MOUTH DAILY     metFORMIN 500 MG tablet  Commonly known as:  GLUCOPHAGE  Take 1 tablet in the AM and 2 tablets in the PM.     multivitamin capsule  Take 1 capsule by mouth daily.     neomycin-polymyxin-hydrocortisone otic solution  Commonly known as:  CORTISPORIN  2-3 drops in each ear three times daily     niacin 500 MG CR tablet  Commonly known as:  NIASPAN  Take 1 tablet (500 mg total) by mouth daily.     omeprazole 40 MG capsule  Commonly known as:  PRILOSEC  Take 1 capsule (40 mg total) by mouth daily.     ONE TOUCH ULTRA SYSTEM KIT w/Device Kit  1 kit by Does not apply route once.     ONE TOUCH ULTRA TEST test strip  Generic drug:  glucose blood  USE TO CHECK BLOOD SUGARS TWICE DAILY AS DIRECTED     VICTOZA 18 MG/3ML Sopn  Generic drug:  Liraglutide  INJECT 1.2MG AS DIRECTED EVERY DAY           Objective:   Physical Exam BP 128/76 mmHg  Pulse 81  Temp(Src) 97.5 F (36.4 C) (Oral)  Ht 5' 5.25" (1.657 m)  Wt 167 lb 8 oz (75.978 kg)  BMI 27.67 kg/m2  SpO2 98% General:   Well developed, well nourished . NAD.  HEENT:  Normocephalic . Face symmetric, atraumatic Lungs:  CTA B Normal respiratory effort, no intercostal retractions, no accessory muscle use. Heart: RRR,  no murmur.  No pretibial edema bilaterally  Skin: Not pale. Not jaundice Neurologic:  alert & oriented X3.  Speech normal, gait appropriate for age and unassisted Psych--  Cognition and judgment appear  intact.  Cooperative with normal attention span and concentration.  Behavior appropriate. No anxious or depressed appearing.      Assessment & Plan:   Assessment> DM  Dr Dwyane Dee  HTN Hyperlipidemia CRI creat ~1.4 Depression/anxiety: tranxene qhs prn (takes rarely) DJD -- hydrocodone  Chronic lymphocytic leukemia-2016 H/o anemia  B12 def CAD, Dr Gwenlyn Found [place] 97 >> CABG, cath 12-13-2006, myoview 2012 no ischemic Venous insuff GI:  Dr Silverio Decamp ---GERD ---IBS ---Gastritis ---H/o ulcerative colitis as a child H/o shingles    PLAN DM: Follow-up closely by endocrinology HTN: Recent BMP normal, continue Tenormin, avapro DJD: Symptoms well-controlled with sporadic Vicodin. Refill printed. Depression anxiety: Takes Tranxene sometimes at night, will call for a refill when needed. RTC 6 months, CPX

## 2015-02-25 NOTE — Assessment & Plan Note (Signed)
DM: Follow-up closely by endocrinology HTN: Recent BMP normal, continue Tenormin, avapro DJD: Symptoms well-controlled with sporadic Vicodin. Refill printed. Depression anxiety: Takes Tranxene sometimes at night, will call for a refill when needed. RTC 6 months, CPX

## 2015-03-13 ENCOUNTER — Other Ambulatory Visit: Payer: Self-pay | Admitting: Endocrinology

## 2015-03-20 ENCOUNTER — Other Ambulatory Visit: Payer: Self-pay | Admitting: Internal Medicine

## 2015-03-24 ENCOUNTER — Other Ambulatory Visit: Payer: Self-pay | Admitting: Endocrinology

## 2015-03-24 ENCOUNTER — Telehealth: Payer: Self-pay | Admitting: Endocrinology

## 2015-03-24 NOTE — Telephone Encounter (Signed)
rx has been sent 

## 2015-03-24 NOTE — Telephone Encounter (Signed)
Pt forgot her victoza and she is not at home and will not be home until Sunday afternoon please advise

## 2015-04-14 DIAGNOSIS — M7541 Impingement syndrome of right shoulder: Secondary | ICD-10-CM | POA: Diagnosis not present

## 2015-04-23 ENCOUNTER — Other Ambulatory Visit: Payer: Self-pay | Admitting: Endocrinology

## 2015-05-18 ENCOUNTER — Other Ambulatory Visit (INDEPENDENT_AMBULATORY_CARE_PROVIDER_SITE_OTHER): Payer: Medicare Other

## 2015-05-18 DIAGNOSIS — E1165 Type 2 diabetes mellitus with hyperglycemia: Secondary | ICD-10-CM

## 2015-05-18 DIAGNOSIS — Z794 Long term (current) use of insulin: Secondary | ICD-10-CM

## 2015-05-18 LAB — BASIC METABOLIC PANEL
BUN: 36 mg/dL — AB (ref 6–23)
CO2: 24 mEq/L (ref 19–32)
CREATININE: 1.66 mg/dL — AB (ref 0.40–1.20)
Calcium: 9.3 mg/dL (ref 8.4–10.5)
Chloride: 107 mEq/L (ref 96–112)
GFR: 31.95 mL/min — AB (ref >60.00)
Glucose, Bld: 123 mg/dL — ABNORMAL HIGH (ref 70–99)
POTASSIUM: 4.1 meq/L (ref 3.5–5.1)
Sodium: 142 mEq/L (ref 135–145)

## 2015-05-18 LAB — HEMOGLOBIN A1C: Hgb A1c MFr Bld: 7 % — ABNORMAL HIGH (ref 4.6–6.5)

## 2015-05-20 ENCOUNTER — Encounter: Payer: Self-pay | Admitting: Endocrinology

## 2015-05-20 ENCOUNTER — Ambulatory Visit (INDEPENDENT_AMBULATORY_CARE_PROVIDER_SITE_OTHER): Payer: Medicare Other | Admitting: Endocrinology

## 2015-05-20 VITALS — BP 128/70 | HR 79 | Wt 168.0 lb

## 2015-05-20 DIAGNOSIS — E1165 Type 2 diabetes mellitus with hyperglycemia: Secondary | ICD-10-CM

## 2015-05-20 DIAGNOSIS — Z794 Long term (current) use of insulin: Secondary | ICD-10-CM

## 2015-05-20 NOTE — Progress Notes (Signed)
Patient ID: Windell Moulding Gellatly, female   DOB: 1940/11/13, 75 y.o.   MRN: [id number]            Reason for Appointment: Followup for Type 2 Diabetes  Referring physician: Larose Kells  History of Present Illness:          Diagnosis: Type 2 diabetes mellitus, date of diagnosis: 1998       Past history:   She was treated with metformin at diagnosis when this had been continued until 2015 Subsequently Amaryl was also added several years ago and this has been continued Her blood sugars were under fair control between 2010 and early 2013 with A1c ranging from 7.4-9.4, mostly under 8% However since 08/2011 her A1c has been mostly over 8%  Insulin was added in 2014 with small doses of Lantus and this has been progressively increased She was started on mealtime insulin on her initial consultation in 9/15 because of high postprandial readings, sometimes over 300 With adding Victoza in 02/2014 her blood sugars were somewhat better with A1c coming down below 8%  Recent history:   INSULIN regimen: Regular 16 units before meals.  NPH 38 units in the morning and 20 hs  Non-insulin hypoglycemic drugs the patient is taking are:  Metformin 500 a.m., 1000 mg p.m. and Victoza 1.2 mg daily  Her blood sugars are overall higher than before and more labile She thinks blood sugars were higher after getting her shoulder injected with a steroid by a month ago This is despite taking 1500 mg of metformin also and Victoza A1c has been consistently under 7, now 7%  Current blood sugar patterns, daily management and problems identified:  Fasting blood sugars: These are mostly high but variable, occasionally over 200  Postprandial readings: she has only occasional blood sugars after her evening meals and difficult sort them out since she is sometimes eating late Does have a couple of readings over 220 Also variable readings in the afternoon with some high readings She does take her insulin at lunchtime if she is eating  a meal  She has been variably active and occasionally when she is active all day her blood sugars seem to be better in the evenings and the next day Her treatment program is limited by coverage on Medicare and the doughnut hole      Side effects from medications have been: None Compliance with the medical regimen: good, she is taking her insulin consistently before meals   Glucose monitoring:  done 1-2 times a day         Glucometer: One Touch.      Blood Glucose readings by time of day from download:   Mean values apply above for all meters except median for One Touch  PRE-MEAL Fasting Lunch Dinner Bedtime Overall  Glucose range: 110-259   100-233  159-222    Mean/median: 161  163 184 160   Self-care: The diet that the patient has been following is: none, usually eating low fat meals Meals: 2-3 meals per day at irregular times. Dinner 6-7 pm Breakfast is a poptart but sometimes oatmeal at 10 am, usually eating sandwiches for meals, snacks will be with crackers         Dietician visit, most recent: never.  She saw the CDE in 02/2014              Exercise:  less recently, is walking when it is warm  Weight history: 150-170 previously  Wt Readings from Last 3 Encounters:  05/20/15 168 lb (76.204 kg)  02/24/15 167 lb (75.751 kg)  02/24/15 167 lb 8 oz (75.978 kg)    Glycemic control:   Lab Results  Component Value Date   HGBA1C 7.0* 05/18/2015   HGBA1C 6.5 12/17/2014   HGBA1C 6.6* 11/16/2014   Lab Results  Component Value Date   MICROALBUR 2.8* 11/16/2014   LDLCALC 40 07/22/2014   CREATININE 1.66* 05/18/2015    OTHER active problems  discussed in review of systems     Medication List       This list is accurate as of: 05/20/15 12:31 PM.  Always use your most recent med list.               aspirin 325 MG tablet  Take 325 mg by mouth daily.     atenolol 50 MG tablet  Commonly known as:  TENORMIN  Take 1 tablet (50 mg total) by mouth daily.     atorvastatin  20 MG tablet  Commonly known as:  LIPITOR  TAKE 1 TABLET(20 MG) BY MOUTH DAILY     azelastine 0.1 % nasal spray  Commonly known as:  ASTELIN  Place 2 sprays into both nostrils at bedtime as needed for rhinitis. Use in each nostril as directed     cholecalciferol 1000 units tablet  Commonly known as:  VITAMIN D  Take 1,000 Units by mouth daily.     clopidogrel 75 MG tablet  Commonly known as:  PLAVIX  Take 1 tablet (75 mg total) by mouth daily.     clorazepate 7.5 MG tablet  Commonly known as:  TRANXENE  Take 1 tablet (7.5 mg total) by mouth as needed.     dicyclomine 20 MG tablet  Commonly known as:  BENTYL  Take 1 tablet (20 mg total) by mouth 3 (three) times daily as needed (for abdominal cramping).     docusate sodium 100 MG capsule  Commonly known as:  COLACE  Take 100 mg by mouth 2 (two) times daily.     fluticasone 50 MCG/ACT nasal spray  Commonly known as:  FLONASE  Place 2 sprays into both nostrils daily.     hydrochlorothiazide 25 MG tablet  Commonly known as:  HYDRODIURIL  TAKE 1 TABLET BY MOUTH DAILY     HYDROcodone-acetaminophen 5-325 MG tablet  Commonly known as:  NORCO/VICODIN  Take 1 tablet by mouth 3 (three) times daily as needed.     insulin NPH Human 100 UNIT/ML injection  Commonly known as:  HUMULIN N,NOVOLIN N  Inject 38 units into the skin every morning and inject 20 units into the skin at bedtime.     insulin regular 100 units/mL injection  Commonly known as:  NOVOLIN R,HUMULIN R  Inject 16 Units into the skin 3 (three) times daily before meals.     INSULIN SYRINGE .5CC/31GX5/16" 31G X 5/16" 0.5 ML Misc  Use 5 syringes per day     irbesartan 300 MG tablet  Commonly known as:  AVAPRO  TAKE 1 TABLET BY MOUTH DAILY     metFORMIN 500 MG tablet  Commonly known as:  GLUCOPHAGE  TAKE 1 TABLET BY MOUTH EVERY MORNING AND 2 TABLETS EVERY EVENING     multivitamin capsule  Take 1 capsule by mouth daily.     neomycin-polymyxin-hydrocortisone otic  solution  Commonly known as:  CORTISPORIN  2-3 drops in each ear three times daily     niacin 500 MG CR tablet  Commonly known as:  NIASPAN  Take 1 tablet (500 mg total) by mouth daily.     NOVOFINE 32G X 6 MM Misc  Generic drug:  Insulin Pen Needle  USE TO INJECT INSULIN THREE TIMES DAILY     omeprazole 40 MG capsule  Commonly known as:  PRILOSEC  Take 1 capsule (40 mg total) by mouth daily.     ONE TOUCH ULTRA SYSTEM KIT w/Device Kit  1 kit by Does not apply route once.     ONE TOUCH ULTRA TEST test strip  Generic drug:  glucose blood  USE TO CHECK BLOOD SUGARS TWICE DAILY AS DIRECTED     VICTOZA 18 MG/3ML Sopn  Generic drug:  Liraglutide  INJECT 1.2MG AS DIRECTED EVERY DAY        Allergies:  Allergies  Allergen Reactions  . Aleve [Naproxen Sodium]     Or Advil  . Codeine     REACTION: makes her nervous, orTylenol #3  . Ibuprofen     REACTION: nervous  . Meperidine Hcl     REACTION: nasuea and vomitting  . Naproxen Sodium     REACTION: nervous    Past Medical History  Diagnosis Date  . Hypertension   . CAD (coronary artery disease)     Dr Gwenlyn Found  . Venous insufficiency   . Hyperlipidemia   . Diabetes mellitus   . GERD (gastroesophageal reflux disease)   . Gastritis   . Hx of colonic polyps   . 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 Ventral hernia   . DJD (degenerative joint disease)   . Headache(784.0)   . Depression   . History of shingles   . History of anemia   . Vitamin B12 deficiency   . 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 Cabg x 5  1997  . Cesarean section    . Knee surgery Left 1996    11-1994 and revision 12-1994 (infex, hardware removed )  . Tonsillectomy    . Tubal ligation    . 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 March Rummage  .  Lap ventral hernia repair with incarcerated colon  09/2008    Dr. Lucia Gaskins  . Rotator cuff repair Right 1997    Family History  Problem Relation Age of Onset  . Heart disease Mother 5  . Heart disease Father 39    MI  . Hypertension Child   . Colon cancer Neg Hx   . Breast cancer Neg Hx   . Heart disease Maternal Aunt     x 2, all deceased  . Heart disease Maternal Uncle     x 4, all deceased  . Emphysema Brother 76    Social History:  reports that she has never smoked. She has never used smokeless tobacco. She reports that she does not drink alcohol or use illicit drugs.    Review of Systems        Lipids: Has had excellent control of hypercholesterolemia with long-term use of Crestor She has a history of coronary bypass surgery.   Also has been  on Niaspan, Prescribed by PCP and is concerned about the cost       Lab Results  Component Value Date   CHOL 126 07/22/2014   HDL 45* 07/22/2014   [place]  40 07/22/2014   LDLDIRECT 43.1 05/08/2012   TRIG 205* 07/22/2014   CHOLHDL 2.8 07/22/2014                  The blood pressure has been  well controlled  and she on 25 mg HCTZ, 150 mg Avapro, and atenolol.  No hypokalemia  Lab Results  Component Value Date   CREATININE 1.66* 05/18/2015   BUN 36* 05/18/2015   NA 142 05/18/2015   K 4.1 05/18/2015   CL 107 05/18/2015   CO2 24 05/18/2015    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 creatinine was relatively higher at times, does not see a nephrologist    Lab Results  Component Value Date   CREATININE 1.66* 05/18/2015   CREATININE 1.4* 02/24/2015   CREATININE 1.32* 02/16/2015       No history of Numbness, tingling or burning in feet but occasional tingling in her left leg   Diabetic foot exam in 9/15 showed:  Decreased monofilament in toes and plantar surfaces, skin exam is normal. Pedal pulses: absent left DP   Physical Examination:  BP 128/70 mmHg  Pulse 79  Wt 168 lb (76.204 kg)  SpO2 96%  Diabetes type 2, uncontrolled with BMI  27   See history of present illness for detailed discussion of her current blood sugar patterns, management and problems identified  Her blood sugars are more difficult to control and somewhat labile at various times including fasting She thinks her blood sugars are higher after getting her shoulder injected with steroid but even fasting blood sugars can be high at times Not clear when her sugars are high in the evening since her mealtimes are variable  Still using relatively large amount of insulin even though she is taking Victoza and metformin  Discussed today that she will get better and more consistent control with the V-go pump compared to NPH and regular regimen which causes variability and also probably difficulty losing weight She is not very keen on doing this even though it was explained how this would be beneficial and limited injections.  She is concerned about the cost   RENAL insufficiency: Her creatinine is relatively higher, unclear why   PLAN:  If she decides to do the V-go pump will give her a trial. Otherwise increase suppertime coverage by 2 units at least More consistent monitoring after meals including lunch and supper No change in NPH as yet Call if blood sugars are not well controlled, discussed blood sugar targets Discussed trying to get patient assistance from the company if she goes in the doughnut hole for the Victoza  Patient Instructions  Increase supper dose to 18  More sugars after supper   Counseling time on subjects discussed above is over 50% of today's 25 minute visit   Jailey Booton 05/20/2015, 12:31 PM   Note: This office note was prepared with Dragon voice recognition system technology. Any transcriptional errors that result from this process are unintentional.

## 2015-05-20 NOTE — Patient Instructions (Signed)
Increase supper dose to 18  More sugars after supper

## 2015-05-28 ENCOUNTER — Other Ambulatory Visit: Payer: Self-pay | Admitting: Endocrinology

## 2015-06-07 ENCOUNTER — Telehealth: Payer: Self-pay | Admitting: Internal Medicine

## 2015-06-07 MED ORDER — HYDROCHLOROTHIAZIDE 25 MG PO TABS: 25.0000 mg | ORAL_TABLET | Freq: Every day | ORAL | Status: DC

## 2015-06-07 NOTE — Telephone Encounter (Signed)
Rx sent 

## 2015-06-07 NOTE — Telephone Encounter (Signed)
Caller name: Self  Can be reached: [phone]    Reason for call: Request refill on hydrochlorothiazide (HYDRODIURIL) 25 MG tablet [id number]

## 2015-06-18 DIAGNOSIS — H35432 Paving stone degeneration of retina, left eye: Secondary | ICD-10-CM | POA: Diagnosis not present

## 2015-06-18 DIAGNOSIS — E113291 Type 2 diabetes mellitus with mild nonproliferative diabetic retinopathy without macular edema, right eye: Secondary | ICD-10-CM | POA: Diagnosis not present

## 2015-06-18 DIAGNOSIS — E113212 Type 2 diabetes mellitus with mild nonproliferative diabetic retinopathy with macular edema, left eye: Secondary | ICD-10-CM | POA: Diagnosis not present

## 2015-06-18 DIAGNOSIS — H43813 Vitreous degeneration, bilateral: Secondary | ICD-10-CM | POA: Diagnosis not present

## 2015-06-24 ENCOUNTER — Other Ambulatory Visit: Payer: Self-pay | Admitting: Cardiovascular Disease

## 2015-06-24 NOTE — Telephone Encounter (Signed)
Rx(s) sent to pharmacy electronically.  

## 2015-07-27 ENCOUNTER — Ambulatory Visit (INDEPENDENT_AMBULATORY_CARE_PROVIDER_SITE_OTHER): Payer: Medicare Other | Admitting: Cardiovascular Disease

## 2015-07-27 ENCOUNTER — Encounter: Payer: Self-pay | Admitting: Cardiovascular Disease

## 2015-07-27 VITALS — BP 144/80 | HR 79 | Ht 66.0 in | Wt 170.4 lb

## 2015-07-27 DIAGNOSIS — I2510 Atherosclerotic heart disease of native coronary artery without angina pectoris: Secondary | ICD-10-CM | POA: Diagnosis not present

## 2015-07-27 DIAGNOSIS — Z79899 Other long term (current) drug therapy: Secondary | ICD-10-CM | POA: Diagnosis not present

## 2015-07-27 DIAGNOSIS — R079 Chest pain, unspecified: Secondary | ICD-10-CM

## 2015-07-27 DIAGNOSIS — I10 Essential (primary) hypertension: Secondary | ICD-10-CM

## 2015-07-27 DIAGNOSIS — E785 Hyperlipidemia, unspecified: Secondary | ICD-10-CM | POA: Diagnosis not present

## 2015-07-27 LAB — LIPID PANEL
Cholesterol: 128 mg/dL (ref 125–200)
HDL: 48 mg/dL (ref >=46)
LDL CALC: 45 mg/dL (ref <130)
Total CHOL/HDL Ratio: 2.7 Ratio (ref <=5.0)
Triglycerides: 176 mg/dL — ABNORMAL HIGH (ref <150)
VLDL: 35 mg/dL — ABNORMAL HIGH (ref <30)

## 2015-07-27 LAB — HEPATIC FUNCTION PANEL
ALBUMIN: 4.1 g/dL (ref 3.6–5.1)
ALK PHOS: 103 U/L (ref 33–130)
ALT: 17 U/L (ref 6–29)
AST: 22 U/L (ref 10–35)
Bilirubin, Direct: 0.1 mg/dL (ref <=0.2)
Indirect Bilirubin: 0.2 mg/dL (ref 0.2–1.2)
TOTAL PROTEIN: 6.8 g/dL (ref 6.1–8.1)
Total Bilirubin: 0.3 mg/dL (ref 0.2–1.2)

## 2015-07-27 NOTE — Patient Instructions (Signed)
Medication Instructions:  Your physician recommends that you continue on your current medications as directed. Please refer to the Current Medication list given to you today.   Labwork: Your physician recommends that you return for lab work AT [place]. The lab can be found on the FIRST FLOOR of out building in [address]   Testing/Procedures: Your physician has requested that you have a lexiscan myoview. For further information please visit [email or web address]. Please follow instruction sheet, as given.    Follow-Up: Your physician wants you to follow-up in: [place]. You will receive a reminder letter in the mail two months in advance. If you don't receive a letter, please call our office to schedule the follow-up appointment.    Pharmacologic Stress Echocardiogram A pharmacologic stress echocardiogram is a heart (cardiac) test used to check the function of your heart. This test may also be called a pharmacologic stress echocardiography. Pharmacologic means that a medicine is used to increase your heart rate and blood pressure.  This stress test will check how well your heart muscle and valves are working and determine if your heart muscle is getting enough blood. Some people exercise on a treadmill, which naturally increases or stresses the functioning of their heart. For those people unable to exercise on a treadmill, a medicine is used. This medicine stimulates your heart and will cause your heart to beat harder and more quickly, as if you were exercising.  An echocardiogram uses sound waves (ultrasound) to produce an image of your heart. If your heart does not work normally, it may indicate coronary artery disease with poor coronary blood supply. The coronary arteries are the arteries that bring blood and oxygen to your heart. LET [facility] CARE PROVIDER KNOW ABOUT:  Any allergies you have.  All medicines you are taking,  including vitamins, herbs, eye drops, creams, and over-the-counter medicines.  Previous problems you or members of your family have had with the use of anesthetics.  Any blood disorders you have.  Previous surgeries you have had.  Medical conditions you have.  Possibility of pregnancy, if this applies. RISKS AND COMPLICATIONS Generally, this is a safe procedure. However, as with any procedure, complications can occur. Possible complications can include:  You develop pain or pressure in the following areas:  Chest.  Jaw or neck.  Between your shoulder blades.  Radiating down your left arm.  Headache.  Dizziness or light-headedness.  Shortness of breath.  Increased or irregular heartbeat.  Low blood pressure.  Nausea or vomiting.  Flushing.  Redness going up the arm and slight pain during injection of medicine.  Heart attack (rare). BEFORE THE PROCEDURE  Avoid all forms of caffeine for 24 hours before your test or as directed by your health care provider. This includes coffee, tea (even decaffeinated tea), caffeinated sodas, chocolate, cocoa, and certain pain medicines.  Follow your health care provider's instructions regarding eating and drinking before the test.  Take your medicines as directed at regular times with water unless instructed otherwise. Exceptions may include:  If you have diabetes, ask how you are to take your insulin or pills. It is common to adjust insulin dosing the morning of the test.  If you are taking beta-blocker medicines, it is important to talk to your health care provider about these medicines well before the date of your test. Taking beta-blocker medicines may interfere with the test. In some cases, these medicines need to be changed or  stopped 24 hours or more before the test.  If you wear a nitroglycerin patch, it may need to be removed prior to the test. Ask your health care provider if the patch should be removed before the  test.  If you use an inhaler for any breathing condition, bring it with you to the test.  If you are an outpatient, bring a snack so you can eat right after the stress phase of the test.  Do not smoke for 4 hours prior to the test or as directed by your health care provider.  Wear comfortable shoes and clothing. Let your health care provider know if you were unable to complete or follow the preparations for your test. PROCEDURE   Multiple electrodes will be put on your chest. If needed, small areas of your chest may be shaved to get better contact with the electrodes. Once the electrodes are attached to your body, multiple wires will be attached to the electrodes, and your heart rate will be monitored.  An IV access will be started, and medicine will be given.  You will have an echocardiogram done at rest and done again at peak heart rate.  To produce an image of the heart, gel is applied to your chest, and a wand-like tool (transducer) is moved over the chest. The transducer sends the sound waves through the chest to create the moving images of your heart. AFTER THE PROCEDURE  Your heart rate and blood pressure will be monitored after the test.  You may return to your normal schedule, including diet, activities, and medicines, unless your health care provider tells you otherwise.   This information is not intended to replace advice given to you by your health care provider. Make sure you discuss any questions you have with your health care provider.   Document Released: 03/11/2003 Document Revised: 12/24/2012 Document Reviewed: 08/26/2012 Elsevier Interactive Patient Education [employer].    If you need a refill on your cardiac medications before your next appointment, please call your pharmacy.

## 2015-07-27 NOTE — Progress Notes (Signed)
   07/27/2015 [name]   09/09/1940  [id number]  Primary Physician Jose Paz, MD Primary Cardiologist:  J  MD FACP, FACC, FAHA, FSCAI  HPI:   [name] is a 74-year-old mildly overweight widowed Caucasian female mother of 2 living children (son at age 34 died of an overdose), grandmother of 5 grandchildren is a patient of Dr. Richard Weintraub. I last saw her in the office 05/27/14. Her cardiac risk factor profile is walkover treated diabetes, hypertension and hyperlipidemia. She has a strong family history of heart disease with a father who died of a myocardial infarction at age 53 and her mother at age 73. She had a heart attack in 1997 at which time I performed cardiac catheterization. Apparently I placed a balloon pump at that time and sent her to open heart surgery. Dr. Peter eventuate a coronary artery bypass grafting x5. She had her last catheterization performed by Dr. Weintraub December 2008 revealing patent grafts with normal LV function. She had a negative Myoview back in 2012. Since I saw her years ago she's had several episodes of chest pain most recent one this past Sunday which was more intense and prolonged.   Current Outpatient Prescriptions  Medication Sig Dispense Refill  . aspirin 325 MG tablet Take 325 mg by mouth daily.    . atenolol (TENORMIN) 50 MG tablet Take 1 tablet (50 mg total) by mouth daily. 90 tablet 2  . atorvastatin (LIPITOR) 20 MG tablet TAKE 1 TABLET(20 MG) BY MOUTH DAILY 30 tablet 9  . azelastine (ASTELIN) 0.1 % nasal spray Place 2 sprays into both nostrils at bedtime as needed for rhinitis. Use in each nostril as directed 30 mL 3  . Blood Glucose Monitoring Suppl (ONE TOUCH ULTRA SYSTEM KIT) W/DEVICE KIT 1 kit by Does not apply route once. 1 each 0  . cholecalciferol (VITAMIN D) 1000 UNITS tablet Take 1,000 Units by mouth daily.      . clopidogrel (PLAVIX) 75 MG tablet Take 1 tablet (75 mg total) by mouth daily. 90 tablet 2  .  clorazepate (TRANXENE) 7.5 MG tablet Take 1 tablet (7.5 mg total) by mouth as needed. 30 tablet 5  . dicyclomine (BENTYL) 20 MG tablet Take 1 tablet (20 mg total) by mouth 3 (three) times daily as needed (for abdominal cramping). 50 tablet 5  . docusate sodium (COLACE) 100 MG capsule Take 100 mg by mouth 2 (two) times daily.    . fluticasone (FLONASE) 50 MCG/ACT nasal spray Place 2 sprays into both nostrils daily. 16 g 3  . hydrochlorothiazide (HYDRODIURIL) 25 MG tablet Take 1 tablet (25 mg total) by mouth daily. 90 tablet 2  . HYDROcodone-acetaminophen (NORCO/VICODIN) 5-325 MG tablet Take 1 tablet by mouth 3 (three) times daily as needed. 90 tablet 0  . insulin NPH Human (HUMULIN N,NOVOLIN N) 100 UNIT/ML injection Inject 38 units into the skin every morning and inject 20 units into the skin at bedtime.    . insulin regular (NOVOLIN R,HUMULIN R) 100 units/mL injection Inject 16 Units into the skin 3 (three) times daily before meals.     . Insulin Syringe-Needle U-100 (INSULIN SYRINGE .5CC/31GX5/16") 31G X 5/16" 0.5 ML MISC Use 5 syringes per day 150 each 3  . irbesartan (AVAPRO) 300 MG tablet TAKE 1 TABLET BY MOUTH DAILY 90 tablet 3  . metFORMIN (GLUCOPHAGE) 500 MG tablet TAKE 1 TABLET BY MOUTH EVERY MORNING AND 2 TABLETS EVERY EVENING 270 tablet 1  . Multiple Vitamin (MULTIVITAMIN) capsule Take   1 capsule by mouth daily.      . neomycin-polymyxin-hydrocortisone (CORTISPORIN) otic solution 2-3 drops in each ear three times daily 10 mL 2  . niacin (NIASPAN) 500 MG CR tablet Take 1 tablet (500 mg total) by mouth daily. 90 tablet 2  . NOVOFINE 32G X 6 MM MISC USE TO INJECT INSULIN THREE TIMES DAILY 100 each 3  . omeprazole (PRILOSEC) 40 MG capsule Take 1 capsule (40 mg total) by mouth daily. 90 capsule 3  . ONE TOUCH ULTRA TEST test strip USE TO CHECK BLOOD SUGARS TWICE DAILY AS DIRECTED 100 each 3  . VICTOZA 18 MG/3ML SOPN INJECT 1.2MG AS DIRECTED EVERY DAY 6 mL 5   No current facility-administered  medications for this visit.     Allergies  Allergen Reactions  . Aleve [Naproxen Sodium]     Or Advil  . Codeine     REACTION: makes her nervous, orTylenol #3  . Ibuprofen     REACTION: nervous  . Meperidine Hcl     REACTION: nasuea and vomitting  . Naproxen Sodium     REACTION: nervous    Social History   Social History  . Marital status: Widowed    Spouse name: N/A  . Number of children: 2  . Years of education: N/A   Occupational History  . retired-- [employer]    .  Retired   Social History Main Topics  . Smoking status: Never Smoker  . Smokeless tobacco: Never Used     Comment: NEVER SMOKED  . Alcohol use No  . Drug use: No  . Sexual activity: Not on file   Other Topics Concern  . Not on file   Social History Narrative   Lives by herself, lost husband ~ 2004     Review of Systems: General: negative for chills, fever, night sweats or weight changes.  Cardiovascular: negative for chest pain, dyspnea on exertion, edema, orthopnea, palpitations, paroxysmal nocturnal dyspnea or shortness of breath Dermatological: negative for rash Respiratory: negative for cough or wheezing Urologic: negative for hematuria Abdominal: negative for nausea, vomiting, diarrhea, bright red blood per rectum, melena, or hematemesis Neurologic: negative for visual changes, syncope, or dizziness All other systems reviewed and are otherwise negative except as noted above.    Blood pressure (!) 144/80, pulse 79, height 5' 6" (1.676 m), weight 170 lb 6.4 oz (77.3 kg).  General appearance: alert and no distress Neck: no adenopathy, no carotid bruit, no JVD, supple, symmetrical, trachea midline and thyroid not enlarged, symmetric, no tenderness/mass/nodules Lungs: clear to auscultation bilaterally Heart: regular rate and rhythm, S1, S2 normal, no murmur, click, rub or gallop  EKG normal sinus rhythm at 79 with right bundle branch block. I personally reviewed this EKG.  ASSESSMENT  AND PLAN:   Hyperlipidemia History of hyperlipidemia on statin therapy with her last liver profile performed 07/22/14 revealing total cholesterol 126, LDL 40 and HDL of 45. We will recheck a lipid and liver profile  Essential hypertension History of hypertension blood pressure measured 144/80. She is on atenolol, hydrochlorothiazide and Avapro. Continued current meds at current dosing  Coronary atherosclerosis History of CAD status post urgent placement of an intra-aortic balloon pump in 1997 followed by coronary artery bypass grafting X 5 by Dr. Peter Van Trigt. Cardiac catheterization performed 2008 by Dr. Weintraub revealed widely patent grafts with normal LV function. Her last Myoview was performed in 2012 and was negative. Since I saw her a year ago she's had several episodes of chest   pain the most recent one was this past Sunday. I'm going to repeat a pharmacologic Myoview stress test to reassess her 76 year old grafts.Lorretta Harp MD FACP,FACC,FAHA, [facility] 07/27/2015 9:43 AM

## 2015-07-27 NOTE — Assessment & Plan Note (Signed)
History of hypertension blood pressure measured 144/80. She is on atenolol, hydrochlorothiazide and Avapro. Continued current meds at current dosing

## 2015-07-27 NOTE — Assessment & Plan Note (Signed)
History of CAD status post urgent placement of an intra-aortic balloon pump in 1997 followed by coronary artery bypass grafting X 5 by Dr. Tharon Aquas Trigt. Cardiac catheterization performed 2008 by Dr. Rollene Fare revealed widely patent grafts with normal LV function. Her last Myoview was performed in 2012 and was negative. Since I saw her a year ago she's had several episodes of chest pain the most recent one was this past Sunday. I'm going to repeat a pharmacologic Myoview stress test to reassess her 76 year old grafts.[name]

## 2015-07-27 NOTE — Addendum Note (Signed)
Addended by: Zebedee Iba on: 07/27/2015 10:15 AM   Modules accepted: Orders

## 2015-07-27 NOTE — Assessment & Plan Note (Signed)
History of hyperlipidemia on statin therapy with her last liver profile performed 07/22/14 revealing total cholesterol 126, LDL 40 and HDL of 45. We will recheck a lipid and liver profile

## 2015-07-28 ENCOUNTER — Encounter: Payer: Self-pay | Admitting: *Deleted

## 2015-07-30 DIAGNOSIS — E113291 Type 2 diabetes mellitus with mild nonproliferative diabetic retinopathy without macular edema, right eye: Secondary | ICD-10-CM | POA: Diagnosis not present

## 2015-07-30 DIAGNOSIS — E113212 Type 2 diabetes mellitus with mild nonproliferative diabetic retinopathy with macular edema, left eye: Secondary | ICD-10-CM | POA: Diagnosis not present

## 2015-07-30 HISTORY — PX: REFRACTIVE SURGERY: SHX103

## 2015-08-03 ENCOUNTER — Telehealth (HOSPITAL_COMMUNITY): Payer: Self-pay

## 2015-08-03 NOTE — Telephone Encounter (Signed)
I did not leave detailed message. I was unable to locate DPR for [facility] in EPIC.  Awaiting return call at this time. I will attempt again tomorrow if pt has not called back by that time.

## 2015-08-04 ENCOUNTER — Telehealth (HOSPITAL_COMMUNITY): Payer: Self-pay

## 2015-08-04 NOTE — Telephone Encounter (Signed)
Encounter complete. 

## 2015-08-05 ENCOUNTER — Ambulatory Visit (HOSPITAL_COMMUNITY)
Admission: RE | Admit: 2015-08-05 | Discharge: 2015-08-05 | Disposition: A | Discharge status: Home / Self Care | Payer: Medicare Other | Source: Ambulatory Visit | Attending: Cardiology | Admitting: Cardiology

## 2015-08-05 DIAGNOSIS — E785 Hyperlipidemia, unspecified: Secondary | ICD-10-CM

## 2015-08-05 DIAGNOSIS — R079 Chest pain, unspecified: Secondary | ICD-10-CM

## 2015-08-05 DIAGNOSIS — E119 Type 2 diabetes mellitus without complications: Secondary | ICD-10-CM | POA: Insufficient documentation

## 2015-08-05 DIAGNOSIS — Z79899 Other long term (current) drug therapy: Secondary | ICD-10-CM

## 2015-08-05 DIAGNOSIS — R0609 Other forms of dyspnea: Secondary | ICD-10-CM | POA: Diagnosis not present

## 2015-08-05 DIAGNOSIS — I10 Essential (primary) hypertension: Secondary | ICD-10-CM | POA: Diagnosis not present

## 2015-08-05 DIAGNOSIS — R5383 Other fatigue: Secondary | ICD-10-CM | POA: Diagnosis not present

## 2015-08-05 DIAGNOSIS — R9439 Abnormal result of other cardiovascular function study: Secondary | ICD-10-CM | POA: Insufficient documentation

## 2015-08-05 LAB — MYOCARDIAL PERFUSION IMAGING
CHL CUP NUCLEAR SRS: 1
CHL CUP NUCLEAR SSS: 9
LV sys vol: 27 mL
LVDIAVOL: 65 mL (ref 46–106)
NUC STRESS TID: 1.16
Peak HR: 84 {beats}/min
Rest HR: 68 {beats}/min
SDS: 8

## 2015-08-05 MED ORDER — TECHNETIUM TC 99M TETROFOSMIN IV KIT
31.2000 | PACK | Freq: Once | INTRAVENOUS | Status: AC | PRN
  Administered 2015-08-05: 31.2 via INTRAVENOUS
  Filled 2015-08-05: qty 31

## 2015-08-05 MED ORDER — REGADENOSON 0.4 MG/5ML IV SOLN
0.4000 mg | Freq: Once | INTRAVENOUS | Status: AC
  Administered 2015-08-05: 0.4 mg via INTRAVENOUS

## 2015-08-05 MED ORDER — TECHNETIUM TC 99M TETROFOSMIN IV KIT
10.2000 | PACK | Freq: Once | INTRAVENOUS | Status: AC | PRN
  Administered 2015-08-05: 10 via INTRAVENOUS
  Filled 2015-08-05: qty 10

## 2015-08-05 MED ORDER — AMINOPHYLLINE 25 MG/ML IV SOLN
75.0000 mg | Freq: Once | INTRAVENOUS | Status: AC
  Administered 2015-08-05: 75 mg via INTRAVENOUS

## 2015-08-05 NOTE — Telephone Encounter (Signed)
Encounter complete. 

## 2015-08-17 ENCOUNTER — Other Ambulatory Visit (INDEPENDENT_AMBULATORY_CARE_PROVIDER_SITE_OTHER): Payer: Medicare Other

## 2015-08-17 ENCOUNTER — Other Ambulatory Visit: Payer: Medicare Other

## 2015-08-17 DIAGNOSIS — Z794 Long term (current) use of insulin: Secondary | ICD-10-CM

## 2015-08-17 DIAGNOSIS — E1165 Type 2 diabetes mellitus with hyperglycemia: Secondary | ICD-10-CM | POA: Diagnosis not present

## 2015-08-17 LAB — COMPREHENSIVE METABOLIC PANEL
ALK PHOS: 76 U/L (ref 39–117)
ALT: 17 U/L (ref 0–35)
AST: 21 U/L (ref 0–37)
Albumin: 4.4 g/dL (ref 3.5–5.2)
BILIRUBIN TOTAL: 0.5 mg/dL (ref 0.2–1.2)
BUN: 28 mg/dL — AB (ref 6–23)
CO2: 25 meq/L (ref 19–32)
CREATININE: 1.67 mg/dL — AB (ref 0.40–1.20)
Calcium: 10 mg/dL (ref 8.4–10.5)
Chloride: 108 mEq/L (ref 96–112)
GFR: 31.71 mL/min — AB (ref >60.00)
GLUCOSE: 98 mg/dL (ref 70–99)
Potassium: 4.4 mEq/L (ref 3.5–5.1)
Sodium: 143 mEq/L (ref 135–145)
TOTAL PROTEIN: 7.3 g/dL (ref 6.0–8.3)

## 2015-08-18 LAB — FRUCTOSAMINE: Fructosamine: 262 umol/L (ref 0–285)

## 2015-08-20 ENCOUNTER — Other Ambulatory Visit: Payer: Self-pay | Admitting: Endocrinology

## 2015-08-20 ENCOUNTER — Ambulatory Visit: Payer: Medicare Other | Admitting: Endocrinology

## 2015-08-23 ENCOUNTER — Other Ambulatory Visit: Payer: Self-pay

## 2015-08-23 MED ORDER — LIRAGLUTIDE 18 MG/3ML ~~LOC~~ SOPN: PEN_INJECTOR | SUBCUTANEOUS | 2 refills | Status: DC

## 2015-08-25 ENCOUNTER — Other Ambulatory Visit: Payer: Self-pay

## 2015-08-25 MED ORDER — GLUCOSE BLOOD VI STRP: ORAL_STRIP | 3 refills | Status: DC

## 2015-08-26 ENCOUNTER — Encounter: Payer: Self-pay | Admitting: Endocrinology

## 2015-08-26 ENCOUNTER — Ambulatory Visit (INDEPENDENT_AMBULATORY_CARE_PROVIDER_SITE_OTHER): Payer: Medicare Other | Admitting: Endocrinology

## 2015-08-26 VITALS — BP 117/70 | HR 74 | Ht 66.0 in | Wt 169.0 lb

## 2015-08-26 DIAGNOSIS — E1165 Type 2 diabetes mellitus with hyperglycemia: Secondary | ICD-10-CM

## 2015-08-26 DIAGNOSIS — R5383 Other fatigue: Secondary | ICD-10-CM | POA: Diagnosis not present

## 2015-08-26 DIAGNOSIS — I2510 Atherosclerotic heart disease of native coronary artery without angina pectoris: Secondary | ICD-10-CM

## 2015-08-26 DIAGNOSIS — Z794 Long term (current) use of insulin: Secondary | ICD-10-CM

## 2015-08-26 DIAGNOSIS — I10 Essential (primary) hypertension: Secondary | ICD-10-CM | POA: Diagnosis not present

## 2015-08-26 DIAGNOSIS — N289 Disorder of kidney and ureter, unspecified: Secondary | ICD-10-CM | POA: Diagnosis not present

## 2015-08-26 NOTE — Progress Notes (Signed)
Patient ID: [name], female   DOB: 1940-05-30, 75 y.o.   MRN: [id number]            Reason for Appointment: Followup for Type 2 Diabetes  Referring physician: Larose Kells  History of Present Illness:          Diagnosis: Type 2 diabetes mellitus, date of diagnosis: 1998       Past history:   She was treated with metformin at diagnosis when this had been continued until 2015 Subsequently Amaryl was also added several years ago and this has been continued Her blood sugars were under fair control between 2010 and early 2013 with A1c ranging from 7.4-9.4, mostly under 8% However since 08/2011 her A1c has been mostly over 8%  Insulin was added in 2014 with small doses of Lantus and this has been progressively increased She was started on mealtime insulin on her initial consultation in 9/15 because of high postprandial readings, sometimes over 300 With adding Victoza in 02/2014 her blood sugars were somewhat better with A1c coming down below 8%  Recent history:   INSULIN regimen: Regular 18 units before meals.  NPH 38 units in the morning and 20 hs  Non-insulin hypoglycemic drugs the patient is taking are:  Metformin 500 a.m., 1000 mg p.m. and Victoza 1.2 mg daily  A1c has been consistently under 7 previously, was 7% on her last visit but fructosamine of 262 indicates better control  Current blood sugar patterns, daily management and problems identified:  Fasting blood sugars: These are relatively better but variable , averaging about 142  Postprandial readings: she has only occasional blood sugars after her evening meal Does have a only rare number of readings over 200 On her last visit her suppertime dose was increased to 16 units but she has also increased her breakfast dose She does take her insulin at lunchtime if she is eating a meal but usually does not  She has been variably active and less recently because of the hot weather Her treatment program is limited by coverage on  Medicare and the doughnut hole However she continues to take Victoza      No hypoglycemia  Side effects from medications have been: None Compliance with the medical regimen: good, she is taking her insulin consistently before meals   Glucose monitoring:  done 1-2 times a day         Glucometer: One Touch.      Blood Glucose readings by time of day from download:  Mean values apply above for all meters except median for One Touch  PRE-MEAL Fasting Lunch Dinner Bedtime Overall  Glucose range: 92-195   90-173  82-209   Mean/median: 142  125   141     Self-care: The diet that the patient has been following is: none, usually eating low fat meals Meals: 2-3 meals per day at irregular times. Dinner 6-7 pm Breakfast is a cereal but sometimes cheese toast; oatmeal at 10 am, usually eating sandwiches for meals, snacks will be with crackers         Dietician visit, most recent: never.  She saw the CDE in 02/2014              Exercise:  less recently, is walking when it is not hot  Weight history: 150-170 previously  Wt Readings from Last 3 Encounters:  08/26/15 169 lb (76.7 kg)  08/05/15 170 lb (77.1 kg)  07/27/15 170 lb 6.4 oz (77.3 kg)    Glycemic  control:   Lab Results  Component Value Date   HGBA1C 7.0 (H) 05/18/2015   HGBA1C 6.5 12/17/2014   HGBA1C 6.6 (H) 11/16/2014   Lab Results  Component Value Date   MICROALBUR 2.8 (H) 11/16/2014   LDLCALC 45 07/27/2015   CREATININE 1.67 (H) 08/17/2015    OTHER active problems  discussed in review of systems     Medication List       Accurate as of 08/26/15  8:40 AM. Always use your most recent med list.          aspirin 325 MG tablet Take 325 mg by mouth daily.   atenolol 50 MG tablet Commonly known as:  TENORMIN Take 1 tablet (50 mg total) by mouth daily.   atorvastatin 20 MG tablet Commonly known as:  LIPITOR TAKE 1 TABLET(20 MG) BY MOUTH DAILY   azelastine 0.1 % nasal spray Commonly known as:  ASTELIN Place 2  sprays into both nostrils at bedtime as needed for rhinitis. Use in each nostril as directed   cholecalciferol 1000 units tablet Commonly known as:  VITAMIN D Take 1,000 Units by mouth daily.   clopidogrel 75 MG tablet Commonly known as:  PLAVIX Take 1 tablet (75 mg total) by mouth daily.   clorazepate 7.5 MG tablet Commonly known as:  TRANXENE Take 1 tablet (7.5 mg total) by mouth as needed.   dicyclomine 20 MG tablet Commonly known as:  BENTYL Take 1 tablet (20 mg total) by mouth 3 (three) times daily as needed (for abdominal cramping).   docusate sodium 100 MG capsule Commonly known as:  COLACE Take 100 mg by mouth 2 (two) times daily.   fluticasone 50 MCG/ACT nasal spray Commonly known as:  FLONASE Place 2 sprays into both nostrils daily.   glucose blood test strip Commonly known as:  ONE TOUCH ULTRA TEST USE TO CHECK BLOOD SUGARS TWICE DAILY AS DIRECTED   hydrochlorothiazide 25 MG tablet Commonly known as:  HYDRODIURIL Take 1 tablet (25 mg total) by mouth daily.   HYDROcodone-acetaminophen 5-325 MG tablet Commonly known as:  NORCO/VICODIN Take 1 tablet by mouth 3 (three) times daily as needed.   insulin NPH Human 100 UNIT/ML injection Commonly known as:  HUMULIN N,NOVOLIN N Inject 38 units into the skin every morning and inject 20 units into the skin at bedtime.   insulin regular 100 units/mL injection Commonly known as:  NOVOLIN R,HUMULIN R Inject 16 Units into the skin 3 (three) times daily before meals.   INSULIN SYRINGE .5CC/31GX5/16" 31G X 5/16" 0.5 ML Misc Use 5 syringes per day   irbesartan 300 MG tablet Commonly known as:  AVAPRO TAKE 1 TABLET BY MOUTH DAILY   metFORMIN 500 MG tablet Commonly known as:  GLUCOPHAGE TAKE 1 TABLET BY MOUTH EVERY MORNING AND 2 TABLETS EVERY EVENING   multivitamin capsule Take 1 capsule by mouth daily.   neomycin-polymyxin-hydrocortisone otic solution Commonly known as:  CORTISPORIN 2-3 drops in each ear three  times daily   niacin 500 MG CR tablet Commonly known as:  NIASPAN Take 1 tablet (500 mg total) by mouth daily.   NOVOFINE 32G X 6 MM Misc Generic drug:  Insulin Pen Needle USE TO INJECT INSULIN THREE TIMES DAILY   omeprazole 40 MG capsule Commonly known as:  PRILOSEC Take 1 capsule (40 mg total) by mouth daily.   ONE TOUCH ULTRA SYSTEM KIT w/Device Kit 1 kit by Does not apply route once.   VICTOZA 18 MG/3ML Sopn Generic drug:  Liraglutide INJECT 1.2MG AS DIRECTED EVERY DAY   Liraglutide 18 MG/3ML Sopn Commonly known as:  VICTOZA Inject 1.2 mg daily       Allergies:  Allergies  Allergen Reactions  . Aleve [Naproxen Sodium]     Or Advil  . Codeine     REACTION: makes her nervous, orTylenol #3  . Ibuprofen     REACTION: nervous  . Meperidine Hcl     REACTION: nasuea and vomitting  . Naproxen Sodium     REACTION: nervous    Past Medical History:  Diagnosis Date  . CAD (coronary artery disease)    Dr Gwenlyn Found  . 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 Depression   . Diabetes mellitus   . DJD (degenerative joint disease)   . 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 Gastritis   . GERD (gastroesophageal reflux disease)   . Headache(784.0)   . History of anemia   . History of shingles   . Hx of colonic polyps   . Hyperlipidemia   . Hypertension   . 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 Venous insufficiency   . Ventral hernia   . Vitamin B12 deficiency     Past Surgical History:  Procedure Laterality Date  . cabg x 5  1997  . CESAREAN SECTION    . KNEE SURGERY Left 1996   11-1994 and revision 12-1994 (infex, hardware removed )  . lap ventral hernia repair with incarcerated colon  09/2008   Dr. Lucia Gaskins  . 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 March Rummage  . ROTATOR CUFF REPAIR Right 1997  . TONSILLECTOMY    . TUBAL LIGATION      Family History    Problem Relation Age of Onset  . Heart disease Mother 66  . Heart disease Father 52    MI  . Hypertension Child   . Heart attack Child   . Heart disease Maternal Aunt     x 2, all deceased  . Heart disease Maternal Uncle     x 4, all deceased  . Emphysema Brother 19  . Colon cancer Neg Hx   . Breast cancer Neg Hx     Social History:  reports that she has never smoked. She has never used smokeless tobacco. She reports that she does not drink alcohol or use drugs.    Review of Systems    Asking about fatigue      Lipids: Has had excellent control of hypercholesterolemia with long-term use of Crestor She has a history of coronary bypass surgery.   Also has been  on Niaspan, Prescribed by PCP       Lab Results  Component Value Date   CHOL 128 07/27/2015   HDL 48 07/27/2015   LDLCALC 45 07/27/2015   LDLDIRECT 43.1 05/08/2012   TRIG 176 (H) 07/27/2015   CHOLHDL 2.7 07/27/2015                  The blood pressure has been  well controlled  and she on 25 mg HCTZ, 150 mg Avapro, and atenolol.  No hypokalemia  Lab Results  Component Value Date   CREATININE 1.67 (H) 08/17/2015   BUN 28 (H) 08/17/2015   NA 143 08/17/2015   K 4.4 08/17/2015   CL 108 08/17/2015   CO2 25 08/17/2015    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 creatinine  Is again relatively higher, does not see a nephrologist    Lab Results  Component Value Date   CREATININE 1.67 (H) 08/17/2015   CREATININE 1.66 (H) 05/18/2015   CREATININE 1.4 (H) 02/24/2015       No history of Numbness, tingling or burning in feet but occasional tingling in her left leg   Diabetic foot exam in 9/15 showed:  Decreased monofilament in toes and plantar surfaces, skin exam is normal. Pedal pulses: absent left DP   Physical Examination:  BP 117/70   Pulse 74   Ht 5' 6"  (1.676 m)   Wt 169 lb (76.7 kg)   BMI 27.28 kg/m   Standing blood pressure 126/72 No edema present  Diabetes type 2, uncontrolled with BMI 27   See history of present  illness for detailed discussion of her current blood sugar patterns, management and problems identified  Her blood sugars are more difficult to control and somewhat labile at various times including fasting She thinks her blood sugars are higher after getting her shoulder injected with steroid but even fasting blood sugars can be high at times Not clear when her sugars are high in the evening since her mealtimes are variable  Still using relatively large amount of insulin even though she is taking Victoza and metformin  RENAL insufficiency: Her creatinine is relatively higher, unclear why, may be related to low normal blood pressure May benefit from reducing HCTZ to half tablet or every other day, but also defer further management to PCP  Fatigue: She will follow-up with PCP, to be seen on Monday for physical  HYPERTENSION: Blood pressure is controlled although may be low normal for her age   PLAN:  Discussed trying to get patient assistance from the company for the Victoza She'll continue the same insulin regimen She will have to reduce her metformin for now down to 500 mg twice a day since GFR is below 35 now She will call if she has any variability in her blood sugars or hypoglycemia Reduce HCTZ as above Follow-up with PCP   Patient Instructions  Check blood sugars on waking up    Also check blood sugars about 2 hours after a meal and do this after different meals by rotation  Recommended blood sugar levels on waking up is 90-130 and about 2 hours after meal is 130-160  Please bring your blood sugar monitor to each visit, thank you  Reduce metformin to 1 am and pm  HCTZ every 2 days      Counseling time on subjects discussed above is over 50% of today's 25 minute visit  Julieana Eshleman 08/26/2015, 8:40 AM   Note: This office note was prepared with Estate agent. Any transcriptional errors that result from this process are unintentional.

## 2015-08-26 NOTE — Patient Instructions (Addendum)
Check blood sugars on waking up    Also check blood sugars about 2 hours after a meal and do this after different meals by rotation  Recommended blood sugar levels on waking up is 90-130 and about 2 hours after meal is 130-160  Please bring your blood sugar monitor to each visit, thank you  Reduce metformin to 1 am and pm  HCTZ every 2 days

## 2015-08-30 ENCOUNTER — Encounter: Payer: Self-pay | Admitting: Family

## 2015-08-30 ENCOUNTER — Other Ambulatory Visit (HOSPITAL_BASED_OUTPATIENT_CLINIC_OR_DEPARTMENT_OTHER): Payer: Medicare Other

## 2015-08-30 ENCOUNTER — Encounter: Payer: Self-pay | Admitting: Internal Medicine

## 2015-08-30 ENCOUNTER — Ambulatory Visit (INDEPENDENT_AMBULATORY_CARE_PROVIDER_SITE_OTHER): Payer: Medicare Other | Admitting: Internal Medicine

## 2015-08-30 ENCOUNTER — Ambulatory Visit (HOSPITAL_BASED_OUTPATIENT_CLINIC_OR_DEPARTMENT_OTHER): Payer: Medicare Other | Admitting: Family

## 2015-08-30 ENCOUNTER — Other Ambulatory Visit: Payer: Self-pay | Admitting: Family

## 2015-08-30 VITALS — BP 122/61 | HR 71 | Temp 97.4°F | Resp 16 | Ht 66.0 in | Wt 171.0 lb

## 2015-08-30 VITALS — BP 126/72 | HR 72 | Temp 97.4°F | Resp 14 | Ht 66.0 in | Wt 172.0 lb

## 2015-08-30 DIAGNOSIS — C9110 Chronic lymphocytic leukemia of B-cell type not having achieved remission: Secondary | ICD-10-CM

## 2015-08-30 DIAGNOSIS — C179 Malignant neoplasm of small intestine, unspecified: Secondary | ICD-10-CM

## 2015-08-30 DIAGNOSIS — M159 Polyosteoarthritis, unspecified: Secondary | ICD-10-CM

## 2015-08-30 DIAGNOSIS — F418 Other specified anxiety disorders: Secondary | ICD-10-CM | POA: Diagnosis not present

## 2015-08-30 DIAGNOSIS — E119 Type 2 diabetes mellitus without complications: Secondary | ICD-10-CM | POA: Diagnosis not present

## 2015-08-30 DIAGNOSIS — I10 Essential (primary) hypertension: Secondary | ICD-10-CM

## 2015-08-30 DIAGNOSIS — F32A Depression, unspecified: Secondary | ICD-10-CM

## 2015-08-30 DIAGNOSIS — M150 Primary generalized (osteo)arthritis: Secondary | ICD-10-CM | POA: Diagnosis not present

## 2015-08-30 DIAGNOSIS — E539 Vitamin B deficiency, unspecified: Secondary | ICD-10-CM

## 2015-08-30 DIAGNOSIS — I2510 Atherosclerotic heart disease of native coronary artery without angina pectoris: Secondary | ICD-10-CM | POA: Diagnosis not present

## 2015-08-30 DIAGNOSIS — Z1231 Encounter for screening mammogram for malignant neoplasm of breast: Secondary | ICD-10-CM

## 2015-08-30 DIAGNOSIS — F419 Anxiety disorder, unspecified: Secondary | ICD-10-CM

## 2015-08-30 DIAGNOSIS — F329 Major depressive disorder, single episode, unspecified: Secondary | ICD-10-CM

## 2015-08-30 DIAGNOSIS — Z780 Asymptomatic menopausal state: Secondary | ICD-10-CM

## 2015-08-30 LAB — MANUAL DIFFERENTIAL (CHCC SATELLITE)
ALC: 15.7 10*3/uL — AB (ref 0.6–2.2)
ANC (CHCC MAN DIFF): 7 10*3/uL — AB (ref 1.5–6.7)
EOS: 1 % (ref 0–7)
LYMPH: 65 % — ABNORMAL HIGH (ref 14–48)
MONO: 5 % (ref 0–13)
PLT EST ~~LOC~~: ADEQUATE
SEG: 29 % — AB (ref 40–75)

## 2015-08-30 LAB — COMPREHENSIVE METABOLIC PANEL
ALBUMIN: 3.7 g/dL (ref 3.5–5.0)
ALK PHOS: 99 U/L (ref 40–150)
ALT: 19 U/L (ref 0–55)
AST: 27 U/L (ref 5–34)
Anion Gap: 10 mEq/L (ref 3–11)
BUN: 26.9 mg/dL — ABNORMAL HIGH (ref 7.0–26.0)
CO2: 23 mEq/L (ref 22–29)
Calcium: 9.7 mg/dL (ref 8.4–10.4)
Chloride: 111 mEq/L — ABNORMAL HIGH (ref 98–109)
Creatinine: 1.6 mg/dL — ABNORMAL HIGH (ref 0.6–1.1)
EGFR: 31 mL/min/{1.73_m2} — AB (ref >90)
GLUCOSE: 106 mg/dL (ref 70–140)
POTASSIUM: 4.7 meq/L (ref 3.5–5.1)
SODIUM: 144 meq/L (ref 136–145)
Total Bilirubin: 0.44 mg/dL (ref 0.20–1.20)
Total Protein: 7.1 g/dL (ref 6.4–8.3)

## 2015-08-30 LAB — CBC WITH DIFFERENTIAL (CANCER CENTER ONLY)
HEMATOCRIT: 35.8 % (ref 34.8–46.6)
HGB: 11.9 g/dL (ref 11.6–15.9)
MCH: 31.9 pg (ref 26.0–34.0)
MCHC: 33.2 g/dL (ref 32.0–36.0)
MCV: 96 fL (ref 81–101)
PLATELETS: 192 10*3/uL (ref 145–400)
RBC: 3.73 10*6/uL (ref 3.70–5.32)
RDW: 14 % (ref 11.1–15.7)
WBC: 24.1 10*3/uL — ABNORMAL HIGH (ref 3.9–10.0)

## 2015-08-30 LAB — CHCC SATELLITE - SMEAR

## 2015-08-30 MED ORDER — HYDROCODONE-ACETAMINOPHEN 5-325 MG PO TABS: 1.0000 | ORAL_TABLET | Freq: Three times a day (TID) | ORAL | 0 refills | Status: DC | PRN

## 2015-08-30 MED ORDER — CLORAZEPATE DIPOTASSIUM 7.5 MG PO TABS: 7.5000 mg | ORAL_TABLET | ORAL | 5 refills | Status: DC | PRN

## 2015-08-30 NOTE — Progress Notes (Signed)
Hematology and Oncology Follow Up Visit  [place] [id number] 1940/02/08 74 y.o. 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 She continues to do well. She has some occasional fatigue that comes and goes.  There has been  no issue with infections. Her WBC count is 24.1. No anemia or thrombocytopenia.  No fever, chills, n/v, cough, rash, dizziness, headache, vision changes, SOB, chest pain, palpitations, abdominal pain or changes in bowel or bladder habits.  No episodes of bleeding or bruising. No lymphadenopathy found on exam.  She states that her blood sugars have been fairly well controlled. Hgb A1c in May was 7.0. She has maintained a good appetite and is staying well hydrated. Her weight is stable.   Medications:    Medication List       Accurate as of 08/30/15 11:33 AM. Always use your most recent med list.          aspirin 325 MG tablet Take 325 mg by mouth daily.   atenolol 50 MG tablet Commonly known as:  TENORMIN Take 1 tablet (50 mg total) by mouth daily.   atorvastatin 20 MG tablet Commonly known as:  LIPITOR TAKE 1 TABLET(20 MG) BY MOUTH DAILY   azelastine 0.1 % nasal spray Commonly known as:  ASTELIN Place 2 sprays into both nostrils at bedtime as needed for rhinitis. Use in each nostril as directed   cholecalciferol 1000 units tablet Commonly known as:  VITAMIN D Take 1,000 Units by mouth daily.   clopidogrel 75 MG tablet Commonly known as:  PLAVIX Take 1 tablet (75 mg total) by mouth daily.   clorazepate 7.5 MG tablet Commonly known as:  TRANXENE Take 1 tablet (7.5 mg total) by mouth as needed.   dicyclomine 20 MG tablet Commonly known as:  BENTYL Take 1 tablet (20 mg total) by mouth 3 (three) times daily as needed (for abdominal cramping).   docusate sodium 100 MG capsule Commonly known as:   COLACE Take 100 mg by mouth 2 (two) times daily.   fluticasone 50 MCG/ACT nasal spray Commonly known as:  FLONASE Place 2 sprays into both nostrils daily.   glucose blood test strip Commonly known as:  ONE TOUCH ULTRA TEST USE TO CHECK BLOOD SUGARS TWICE DAILY AS DIRECTED   HYDROcodone-acetaminophen 5-325 MG tablet Commonly known as:  NORCO/VICODIN Take 1 tablet by mouth 3 (three) times daily as needed.   insulin NPH Human 100 UNIT/ML injection Commonly known as:  HUMULIN N,NOVOLIN N Inject 38 units into the skin every morning and inject 20 units into the skin at bedtime.   insulin regular 100 units/mL injection Commonly known as:  NOVOLIN R,HUMULIN R Inject 16 Units into the skin 3 (three) times daily before meals.   INSULIN SYRINGE .5CC/31GX5/16" 31G X 5/16" 0.5 ML Misc Use 5 syringes per day   irbesartan 300 MG tablet Commonly known as:  AVAPRO TAKE 1 TABLET BY MOUTH DAILY   metFORMIN 500 MG tablet Commonly known as:  GLUCOPHAGE TAKE 1 TABLET BY MOUTH EVERY MORNING AND 2 TABLETS EVERY EVENING   multivitamin capsule Take 1 capsule by mouth daily.   neomycin-polymyxin-hydrocortisone otic solution Commonly known as:  CORTISPORIN 2-3 drops in each ear three times daily   niacin 500 MG CR tablet Commonly known as:  NIASPAN Take 1 tablet (500 mg total) by mouth  daily.   NOVOFINE 32G X 6 MM Misc Generic drug:  Insulin Pen Needle USE TO INJECT INSULIN THREE TIMES DAILY   omeprazole 40 MG capsule Commonly known as:  PRILOSEC Take 1 capsule (40 mg total) by mouth daily.   ONE TOUCH ULTRA SYSTEM KIT w/Device Kit 1 kit by Does not apply route once.   VICTOZA 18 MG/3ML Sopn Generic drug:  Liraglutide INJECT 1.2MG AS DIRECTED EVERY DAY   Liraglutide 18 MG/3ML Sopn Commonly known as:  VICTOZA Inject 1.2 mg daily       Allergies:  Allergies  Allergen Reactions  . Aleve [Naproxen Sodium]     Or Advil  . Codeine     REACTION: makes her nervous, orTylenol #3   . Ibuprofen     REACTION: nervous  . Meperidine Hcl     REACTION: nasuea and vomitting  . Naproxen Sodium     REACTION: nervous    Past Medical History, Surgical history, Social history, and Family History were reviewed and updated.  Review of Systems: All other 10 point review of systems is negative.   Physical Exam:  height is 5' 6"  (1.676 m) and weight is 171 lb (77.6 kg). Her oral temperature is 97.4 F (36.3 C). Her blood pressure is 122/61 and her pulse is 71. Her respiration is 16.   Wt Readings from Last 3 Encounters:  08/30/15 171 lb (77.6 kg)  08/30/15 172 lb (78 kg)  08/26/15 169 lb (76.7 kg)    Ocular: Sclerae unicteric, pupils equal, round and reactive to light Ear-nose-throat: Oropharynx clear, dentition fair Lymphatic: No cervical supraclavicular or axillary adenopathy Lungs no rales or rhonchi, good excursion bilaterally Heart regular rate and rhythm, no murmur appreciated Abd soft, nontender, positive bowel sounds, no liver or spleen tip palpated on exam  MSK no focal spinal tenderness, no joint edema Neuro: non-focal, well-oriented, appropriate affect Breasts: [place]  Lab Results  Component Value Date   WBC 19.2 (H) 02/24/2015   HGB 11.7 02/24/2015   HCT 34.8 02/24/2015   MCV 94 02/24/2015   PLT 207 02/24/2015   Lab Results  Component Value Date   FERRITIN 30.4 09/01/2008   IRON 93 05/19/2009   IRONPCTSAT 23.9 05/19/2009   Lab Results  Component Value Date   RBC 3.72 02/24/2015   No results found for: Nils Pyle [facility] Lab Results  Component Value Date   IGGSERUM 1,030 02/25/2014   IGA 281 02/25/2014   IGMSERUM 41 (L) 02/25/2014   Lab Results  Component Value Date   TOTALPROTELP 7.2 02/25/2014   ALBUMINELP 54.3 (L) 02/25/2014   A1GS 4.4 02/25/2014   A2GS 13.7 (H) 02/25/2014   BETS 7.0 02/25/2014   BETA2SER 6.2 02/25/2014   GAMS 14.4 02/25/2014   MSPIKE NOT DET 02/25/2014   SPEI * 02/25/2014     Chemistry       Component Value Date/Time   NA 143 08/17/2015 0943   NA 144 02/24/2015 0933   K 4.4 08/17/2015 0943   K 3.8 02/24/2015 0933   CL 108 08/17/2015 0943   CL 101 10/31/2013 1041   CO2 25 08/17/2015 0943   CO2 22 02/24/2015 0933   BUN 28 (H) 08/17/2015 0943   BUN 18.4 02/24/2015 0933   CREATININE 1.67 (H) 08/17/2015 0943   CREATININE 1.4 (H) 02/24/2015 0933      Component Value Date/Time   CALCIUM 10.0 08/17/2015 0943   CALCIUM 9.0 02/24/2015 0933   ALKPHOS 76 08/17/2015 0943   ALKPHOS 104  02/24/2015 0933   AST 21 08/17/2015 0943   AST 22 02/24/2015 0933   ALT 17 08/17/2015 0943   ALT 18 02/24/2015 0933   BILITOT 0.5 08/17/2015 0943   BILITOT 0.36 02/24/2015 0933     Impression and Plan: [name] is 75 yo white female with stage A CLL. She has done well and continues to be asymptomatic. Her WBC count is elevated at 24.1. She has not experienced any anemia or thrombocytopenia.  We will continue to follow along with her and had planned to see her back in 6 months but she has requested that her follow-up be in 1 year. I spoke with Dr. Marin Olp and he feels that this will be ok.  She does see her PCP regularly and they will notify us of any abnormalities with her blood work.  She knows to watch out for weight loss/loss of appetite, increased fatigue, fevers and other symptoms indicative of worsening disease and will contact our office with any questions or concerns. We can certainly see her much sooner if needed.   Eliezer Bottom, NP 8/28/201711:33 AM

## 2015-08-30 NOTE — Assessment & Plan Note (Signed)
DM: Per endocrinology HTN: Recent creatinine slightly increased, endocrinology recommended to decrease HCTZ dose. BP still satisfactory. Plan: Stop HCTZ. Monitor BPs. Will check a BMP in 3 months Depression anxiety: Refill Tranxene today  DJD: Refill hydrocodone today CAD: C/o  chest pain, stress test was intermediate risk, no crescendo sx, has a routine appointment to see cardiology soon B12 deficiency: Labs on RTC RTC 3 months

## 2015-08-30 NOTE — Progress Notes (Signed)
Pre visit review using our clinic review tool, if applicable. No additional management support is needed unless otherwise documented below in the visit note. 

## 2015-08-30 NOTE — Assessment & Plan Note (Signed)
Tdap--09/01/10 ;PNA--2010 ;prevnar--2015; Shingles--03/19/13    Pap--no further screening is recommended  MMG--last on record 2012,   breast exam 2016 abnormal, did not proceed w/ MMG-US. Today exam show no abnormalities. Will schedule a mammogram.  Bone Density--not done last year, will schedule again.  CCS--cscope 2011, again 11-2014: + Polyps, next per GI  diet and exercise discussed

## 2015-08-30 NOTE — Progress Notes (Signed)
Subjective:    Patient ID: Gorman, female    DOB: 06-04-1940, 75 y.o.   MRN: [id number]  DOS:  08/30/2015 Type of visit - description :  Here for Medicare AWV:   1. Risk factors based on Past M, S, F history: reviewed 2. Physical Activities: not very active d/t weather, plans to go back to [place] routine exercise  3. Depression/mood: neg screening  4. Hearing:  No problems noted or reported  5. ADL's: independent, drives  6. Fall Risk: no recent falls, prevention discussed , see AVS 7. home Safety: does feel safe at home  8. Height, weight, & visual acuity: see VS, no recent eye sx or concerns  9. Counseling: provided 10. Labs ordered based on risk factors: if needed  11. Referral Coordination: if needed 12. Care Plan, see assessment and plan , written personalized plan provided , see AVS 13. Cognitive Assessment: motor skills and cognition appropriate for age 69. Care team updated  15. End-of-life care --  rec to get a HC-POA   In addition, today we discussed the following: DM: Note from endocrinology reviewed HTN: Good medication compliance, ambulatory BPs within normal per patient DJD: Rarely takes hydrocodone, due for refills Insomnia: on Tranxene as needed, due for a refill. CAD: Chart reviewed. Had a recent stress test. Hyperlipidemia: Good med compliance.    Review of Systems  Constitutional: No fever. No chills. No unexplained wt changes. No unusual sweats  HEENT: No dental problems, no ear discharge, no facial swelling, no voice changes. No eye discharge, no eye  redness , no  intolerance to light   Respiratory: No wheezing , no  difficulty breathing. No cough , no mucus production  Cardiovascular: Reports occasional chest pain, it could be at the left or right self the chest, sharp, lasts few seconds. Symptoms are not getting more frequent or intense. Recently had a stress test which is reviewed., no leg swelling , no  Palpitations  GI: no nausea, no  vomiting, no diarrhea , no  abdominal pain.  No blood in the stools. No dysphagia, no odynophagia    Endocrine: No polyphagia, no polyuria , no polydipsia  GU: No dysuria, gross hematuria, difficulty urinating. No urinary urgency, no frequency.  Musculoskeletal: No joint swellings or unusual aches or pains  Skin: No change in the color of the skin, palor , no  Rash  Allergic, immunologic: No environmental allergies , no  food allergies  Neurological: No dizziness no  syncope. No headaches. No diplopia, no slurred, no slurred speech, no motor deficits, no facial  Numbness  Hematological: No enlarged lymph nodes, no easy bruising , no unusual bleedings  Psychiatry: No suicidal ideas, no hallucinations, no beavior problems, no confusion.  No unusual/severe anxiety, no depression  Past Medical History:  Diagnosis Date  . CAD (coronary artery disease)    Dr Gwenlyn Found  . CLL (chronic lymphocytic leukemia) ([place]) 02/25/2014  . Depression   . Diabetes mellitus   . DJD (degenerative joint disease)   . 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 Gastritis   . GERD (gastroesophageal reflux disease)   . Headache(784.0)   . History of anemia   . History of shingles   . Hx of colonic polyps   . Hyperlipidemia   . Hypertension   . 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 Venous insufficiency   . Ventral hernia   .  Vitamin B12 deficiency     Past Surgical History:  Procedure Laterality Date  . cabg x 5  1997  . CESAREAN SECTION    . EYE SURGERY Left 07/30/2015   Laser  . KNEE SURGERY Left 1996   11-1994 and revision 12-1994 (infex, hardware removed )  . lap ventral hernia repair with incarcerated colon  09/2008   Dr. Lucia Gaskins  . 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 March Rummage  . ROTATOR CUFF REPAIR Right 1997  . TONSILLECTOMY    . TUBAL LIGATION      Social History   Social History  . Marital  status: Widowed    Spouse name: N/A  . Number of children: 2  . Years of education: N/A   Occupational History  . retired-- [employer]    .  Retired   Social History Main Topics  . Smoking status: Never Smoker  . Smokeless tobacco: Never Used     Comment: NEVER SMOKED  . Alcohol use No  . Drug use: No  . Sexual activity: Not on file   Other Topics Concern  . Not on file   Social History Narrative   Lives by herself, lost husband ~ 2004      Family History  Problem Relation Age of Onset  . Heart disease Mother 64  . Heart disease Father 44    MI  . Hypertension Child   . Heart attack Child   . Heart disease Maternal Aunt     x 2, all deceased  . Heart disease Maternal Uncle     x 4, all deceased  . Emphysema Brother 9  . Colon cancer Neg Hx   . Breast cancer Neg Hx        Medication List       Accurate as of 08/30/15  5:39 PM. Always use your most recent med list.          aspirin 325 MG tablet Take 325 mg by mouth daily.   atenolol 50 MG tablet Commonly known as:  TENORMIN Take 1 tablet (50 mg total) by mouth daily.   atorvastatin 20 MG tablet Commonly known as:  LIPITOR TAKE 1 TABLET(20 MG) BY MOUTH DAILY   azelastine 0.1 % nasal spray Commonly known as:  ASTELIN Place 2 sprays into both nostrils at bedtime as needed for rhinitis. Use in each nostril as directed   cholecalciferol 1000 units tablet Commonly known as:  VITAMIN D Take 1,000 Units by mouth daily.   clopidogrel 75 MG tablet Commonly known as:  PLAVIX Take 1 tablet (75 mg total) by mouth daily.   clorazepate 7.5 MG tablet Commonly known as:  TRANXENE Take 1 tablet (7.5 mg total) by mouth as needed.   dicyclomine 20 MG tablet Commonly known as:  BENTYL Take 1 tablet (20 mg total) by mouth 3 (three) times daily as needed (for abdominal cramping).   docusate sodium 100 MG capsule Commonly known as:  COLACE Take 100 mg by mouth 2 (two) times daily.   fluticasone 50 MCG/ACT  nasal spray Commonly known as:  FLONASE Place 2 sprays into both nostrils daily.   glucose blood test strip Commonly known as:  ONE TOUCH ULTRA TEST USE TO CHECK BLOOD SUGARS TWICE DAILY AS DIRECTED   HYDROcodone-acetaminophen 5-325 MG tablet Commonly known as:  NORCO/VICODIN Take 1 tablet by mouth 3 (three) times daily as needed.   insulin NPH Human 100 UNIT/ML injection Commonly known as:  HUMULIN N,NOVOLIN  N Inject 38 units into the skin every morning and inject 20 units into the skin at bedtime.   insulin regular 100 units/mL injection Commonly known as:  NOVOLIN R,HUMULIN R Inject 16 Units into the skin 3 (three) times daily before meals.   INSULIN SYRINGE .5CC/31GX5/16" 31G X 5/16" 0.5 ML Misc Use 5 syringes per day   irbesartan 300 MG tablet Commonly known as:  AVAPRO TAKE 1 TABLET BY MOUTH DAILY   metFORMIN 500 MG tablet Commonly known as:  GLUCOPHAGE TAKE 1 TABLET BY MOUTH EVERY MORNING AND 2 TABLETS EVERY EVENING   multivitamin capsule Take 1 capsule by mouth daily.   neomycin-polymyxin-hydrocortisone otic solution Commonly known as:  CORTISPORIN 2-3 drops in each ear three times daily   niacin 500 MG CR tablet Commonly known as:  NIASPAN Take 1 tablet (500 mg total) by mouth daily.   NOVOFINE 32G X 6 MM Misc Generic drug:  Insulin Pen Needle USE TO INJECT INSULIN THREE TIMES DAILY   omeprazole 40 MG capsule Commonly known as:  PRILOSEC Take 1 capsule (40 mg total) by mouth daily.   ONE TOUCH ULTRA SYSTEM KIT w/Device Kit 1 kit by Does not apply route once.   VICTOZA 18 MG/3ML Sopn Generic drug:  Liraglutide INJECT 1.2MG AS DIRECTED EVERY DAY   Liraglutide 18 MG/3ML Sopn Commonly known as:  VICTOZA Inject 1.2 mg daily          Objective:   Physical Exam BP 126/72 (BP Location: Left Arm, Patient Position: Sitting, Cuff Size: Normal)   Pulse 72   Temp 97.4 F (36.3 C) (Oral)   Resp 14   Ht _0  (1.676 m)   Wt 172 lb (78 kg)   SpO2 98%    BMI 27.76 kg/m   General:   Well developed, well nourished . NAD.  Neck: No  thyromegaly  HEENT:  Normocephalic . Face symmetric, atraumatic Lungs:  CTA B Normal respiratory effort, no intercostal retractions, no accessory muscle use. Heart: RRR,  no murmur.  No pretibial edema bilaterally  Abdomen:  Not distended, soft, non-tender. No rebound or rigidity.   Breast: no dominant mass, skin and nipples normal to inspection on palpation, axillary areas without mass or lymphadenopathy Skin: Exposed areas without rash. Not pale. Not jaundice Neurologic:  alert & oriented X3.  Speech normal, gait appropriate for age and unassisted Strength symmetric and appropriate for age.  Psych: Cognition and judgment appear intact.  Cooperative with normal attention span and concentration.  Behavior appropriate. No anxious or depressed appearing.    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 BP still satisfactory. Plan: Stop HCTZ. Monitor BPs. Will check a BMP in 3 months Depression anxiety: Refill Tranxene today  DJD: Refill hydrocodone today CAD: C/o  chest pain, stress test was intermediate risk, no crescendo sx, has a routine appointment to see cardiology soon B12 deficiency: Labs on RTC RTC 3 months

## 2015-08-30 NOTE — Patient Instructions (Signed)
GO TO THE FRONT DESK Schedule your next appointment for a  routine checkup in 3 months. No fasting  Stop hydrochlorothiazide completely    Check the  blood pressure 2 or 3 times a week Goal BP between 110/65 and  145/85. If it is consistently higher or lower, let me know       Fall Prevention and Home Safety Falls cause injuries and can affect all age groups. It is possible to use preventive measures to significantly decrease the likelihood of falls. There are many simple measures which can make your home safer and prevent falls. OUTDOORS  Repair cracks and edges of walkways and driveways.  Remove high doorway thresholds.  Trim shrubbery on the main path into your home.  Have good outside lighting.  Clear walkways of tools, rocks, debris, and clutter.  Check that handrails are not broken and are securely fastened. Both sides of steps should have handrails.  Have leaves, snow, and ice cleared regularly.  Use sand or salt on walkways during winter months.  In the garage, clean up grease or oil spills. BATHROOM  Install night lights.  Install grab bars by the toilet and in the tub and shower.  Use non-skid mats or decals in the tub or shower.  Place a plastic non-slip stool in the shower to sit on, if needed.  Keep floors dry and clean up all water on the floor immediately.  Remove soap buildup in the tub or shower on a regular basis.  Secure bath mats with non-slip, double-sided rug tape.  Remove throw rugs and tripping hazards from the floors. BEDROOMS  Install night lights.  Make sure a bedside light is easy to reach.  Do not use oversized bedding.  Keep a telephone by your bedside.  Have a firm chair with side arms to use for getting dressed.  Remove throw rugs and tripping hazards from the floor. KITCHEN  Keep handles on pots and pans turned toward the center of the stove. Use back burners when possible.  Clean up spills quickly and allow time  for drying.  Avoid walking on wet floors.  Avoid hot utensils and knives.  Position shelves so they are not too high or low.  Place commonly used objects within easy reach.  If necessary, use a sturdy step stool with a grab bar when reaching.  Keep electrical cables out of the way.  Do not use floor polish or wax that makes floors slippery. If you must use wax, use non-skid floor wax.  Remove throw rugs and tripping hazards from the floor. STAIRWAYS  Never leave objects on stairs.  Place handrails on both sides of stairways and use them. Fix any loose handrails. Make sure handrails on both sides of the stairways are as long as the stairs.  Check carpeting to make sure it is firmly attached along stairs. Make repairs to worn or loose carpet promptly.  Avoid placing throw rugs at the top or bottom of stairways, or properly secure the rug with carpet tape to prevent slippage. Get rid of throw rugs, if possible.  Have an electrician put in a light switch at the top and bottom of the stairs. OTHER FALL PREVENTION TIPS  Wear low-heel or rubber-soled shoes that are supportive and fit well. Wear closed toe shoes.  When using a stepladder, make sure it is fully opened and both spreaders are firmly locked. Do not climb a closed stepladder.  Add color or contrast paint or tape to grab bars and  handrails in your home. Place contrasting color strips on first and last steps.  Learn and use mobility aids as needed. Install an electrical emergency response system.  Turn on lights to avoid dark areas. Replace light bulbs that burn out immediately. Get light switches that glow.  Arrange furniture to create clear pathways. Keep furniture in the same place.  Firmly attach carpet with non-skid or double-sided tape.  Eliminate uneven floor surfaces.  Select a carpet pattern that does not visually hide the edge of steps.  Be aware of all pets. OTHER HOME SAFETY TIPS  Set the water  temperature for 120 F (48.8 C).  Keep emergency numbers on or near the telephone.  Keep smoke detectors on every level of the home and near sleeping areas. Document Released: 12/09/2001 Document Revised: 06/20/2011 Document Reviewed: 03/10/2011 [facility] Patient Information 2015 [place], [place]. This information is not intended to replace advice given to you by your health care provider. Make sure you discuss any questions you have with your health care provider.   Preventive Care for Adults Ages 69 and over  Blood pressure check.** / Every 1 to 2 years.  Lipid and cholesterol check.**/ Every 5 years beginning at age 13.  Lung cancer screening. / Every year if you are aged 67-80 years and have a 30-pack-year history of smoking and currently smoke or have quit within the past 15 years. Yearly screening is stopped once you have quit smoking for at least 15 years or develop a health problem that would prevent you from having lung cancer treatment.  Fecal occult blood test (FOBT) of stool. / Every year beginning at age 12 and continuing until age 30. You may not have to do this test if you get a colonoscopy every 10 years.  Flexible sigmoidoscopy** or colonoscopy.** / Every 5 years for a flexible sigmoidoscopy or every 10 years for a colonoscopy beginning at age 49 and continuing until age 4.  Hepatitis C blood test.** / For all people born from [birth year] through 1966 and any individual with known risks for hepatitis C.  Abdominal aortic aneurysm (AAA) screening.** / A one-time screening for ages 49 to 45 years who are current or former smokers.  Skin self-exam. / Monthly.  Influenza vaccine. / Every year.  Tetanus, diphtheria, and acellular pertussis (Tdap/Td) vaccine.** / 1 dose of Td every 10 years.  Varicella vaccine.** / Consult your health care provider.  Zoster vaccine.** / 1 dose for adults aged 66 years or older.  Pneumococcal 13-valent conjugate (PCV13) vaccine.** / Consult  your health care provider.  Pneumococcal polysaccharide (PPSV23) vaccine.** / 1 dose for all adults aged 51 years and older.  Meningococcal vaccine.** / Consult your health care provider.  Hepatitis A vaccine.** / Consult your health care provider.  Hepatitis B vaccine.** / Consult your health care provider.  Haemophilus influenzae type b (Hib) vaccine.** / Consult your health care provider. **Family history and personal history of risk and conditions may change your health care provider's recommendations. Document Released: 02/14/2001 Document Revised: 12/24/2012 Document Reviewed: 05/16/2010 [facility] Patient Information 2015 [place], [place]. This information is not intended to replace advice given to you by your health care provider. Make sure you discuss any questions you have with your health care provider.

## 2015-09-07 ENCOUNTER — Encounter: Payer: Self-pay | Admitting: Cardiovascular Disease

## 2015-09-07 ENCOUNTER — Ambulatory Visit
Admission: RE | Admit: 2015-09-07 | Discharge: 2015-09-07 | Disposition: A | Discharge status: Home / Self Care | Payer: Medicare Other | Source: Ambulatory Visit | Attending: Cardiovascular Disease | Admitting: Cardiovascular Disease

## 2015-09-07 ENCOUNTER — Other Ambulatory Visit: Payer: Self-pay | Admitting: *Deleted

## 2015-09-07 ENCOUNTER — Ambulatory Visit (INDEPENDENT_AMBULATORY_CARE_PROVIDER_SITE_OTHER): Payer: Medicare Other | Admitting: Cardiovascular Disease

## 2015-09-07 VITALS — BP 144/86 | HR 72 | Ht 66.0 in | Wt 173.0 lb

## 2015-09-07 DIAGNOSIS — R9439 Abnormal result of other cardiovascular function study: Secondary | ICD-10-CM

## 2015-09-07 DIAGNOSIS — Z79899 Other long term (current) drug therapy: Secondary | ICD-10-CM | POA: Diagnosis not present

## 2015-09-07 DIAGNOSIS — I2510 Atherosclerotic heart disease of native coronary artery without angina pectoris: Secondary | ICD-10-CM

## 2015-09-07 DIAGNOSIS — D689 Coagulation defect, unspecified: Secondary | ICD-10-CM

## 2015-09-07 DIAGNOSIS — Z01818 Encounter for other preprocedural examination: Secondary | ICD-10-CM

## 2015-09-07 DIAGNOSIS — R079 Chest pain, unspecified: Secondary | ICD-10-CM | POA: Diagnosis not present

## 2015-09-07 LAB — BASIC METABOLIC PANEL
BUN: 16 mg/dL (ref 7–25)
CALCIUM: 9.3 mg/dL (ref 8.6–10.4)
CO2: 23 mmol/L (ref 20–31)
CREATININE: 1.39 mg/dL — AB (ref 0.60–0.93)
Chloride: 115 mmol/L — ABNORMAL HIGH (ref 98–110)
Glucose, Bld: 104 mg/dL — ABNORMAL HIGH (ref 65–99)
Potassium: 4.5 mmol/L (ref 3.5–5.3)
Sodium: 146 mmol/L (ref 135–146)

## 2015-09-07 NOTE — Patient Instructions (Signed)
Medication Instructions:  Your physician recommends that you continue on your current medications as directed. Please refer to the Current Medication list given to you today.  PRIOR TO PROCEDURE: 1- DO NOT TAKE YOUR Metformin the day before or morning of the procedure. 2- Take 1/2 your evening dose of NPH insulin the night before procedure. 2- Do not take any of your sliding scale or meal insulin the day of the procedure.   Testing/Procedures: Your physician has requested that you have a LEFT cardiac catheterization WITH GRAFTING. Cardiac catheterization is used to diagnose and/or treat various heart conditions. Doctors may recommend this procedure for a number of different reasons. The most common reason is to evaluate chest pain. Chest pain can be a symptom of coronary artery disease (CAD), and cardiac catheterization can show whether plaque is narrowing or blocking your heart's arteries. This procedure is also used to evaluate the valves, as well as measure the blood flow and oxygen levels in different parts of your heart. For further information please visit [email or web address].   Following your catheterization, you will not be allowed to drive for 3 days.  No lifting, pushing, or pulling greater that 10 pounds is allowed for 1 week.  You will be required to have the following tests prior to the procedure:  1. Blood work-the blood work can be done no more than 14 days prior to the procedure.  It can be done at any [facility] lab.  There is one downstairs on the first floor of this building and one in the [facility] building [phone]).  2. Chest Xray-the chest xray order has already been placed at the [place].      Puncture site RIGHT FEMORAL   If you need a refill on your cardiac medications before your next appointment, please call your pharmacy.

## 2015-09-07 NOTE — Assessment & Plan Note (Signed)
[name] returns today for follow-up of her Myoview stress test which was performed on 08/05/15 which showed an area of anterolateral ischemia. Given her recent episode of chest pain with several subsequent minor episodes and the fact that her bypass grafts are 76 years old decided to proceed with outpatient cardiac catheterization. She does have moderate renal insufficiency and therefore we will need to limit her contrast load. The patient understands that risks included but are not limited to stroke (1 in 1000), death (1 in 67), kidney failure [usually temporary] (1 in 500), bleeding (1 in 200), allergic reaction [possibly serious] (1 in 200). The patient understands and agrees to proceed

## 2015-09-07 NOTE — Progress Notes (Signed)
09/07/2015 [facility]   11/20/1940  [id number]  Primary Physician Kathlene November, MD Primary Cardiologist: Lorretta Harp MD Renae Gloss  HPI:  Ms. Biven is a 75 year old mildly overweight widowed Caucasian female mother of 2 living children (son at age 74 died of an overdose), grandmother of 5 grandchildren is a patient of Dr. Terance Ice. I last saw her in the office 07/27/15. Her cardiac risk factor profile is walkover treated diabetes, hypertension and hyperlipidemia. She has a strong family history of heart disease with a father who died of a myocardial infarction at age 29 and her mother at age 38. She had a heart attack in 1997 at which time I performed cardiac catheterization. Apparently I placed a balloon pump at that time and sent her to open heart surgery. Dr. Tharon Aquas Trigt performed coronary artery bypass grafting x5. She had her last catheterization performed by Dr. Rollene Fare December 2008 revealing patent grafts with normal LV function. She had a negative Myoview back in 2012. Since I saw her years ago she's had several episodes of chest pain most recent one this past Sunday which was more intense and prolonged.She had a Myoview stress test performed 08/05/15 that showed new anterolateral ischemia. She's had several episodes of chest pain since I saw her back a month ago.   Current Outpatient Prescriptions  Medication Sig Dispense Refill  . aspirin 325 MG tablet Take 325 mg by mouth daily.    Marland Kitchen atenolol (TENORMIN) 50 MG tablet Take 1 tablet (50 mg total) by mouth daily. 90 tablet 2  . atorvastatin (LIPITOR) 20 MG tablet TAKE 1 TABLET(20 MG) BY MOUTH DAILY 30 tablet 9  . azelastine (ASTELIN) 0.1 % nasal spray Place 2 sprays into both nostrils at bedtime as needed for rhinitis. Use in each nostril as directed 30 mL 3  . Blood Glucose Monitoring Suppl (ONE TOUCH ULTRA SYSTEM KIT) W/DEVICE KIT 1 kit by Does not apply route once. 1 each 0  . cholecalciferol  (VITAMIN D) 1000 UNITS tablet Take 1,000 Units by mouth daily.      . clopidogrel (PLAVIX) 75 MG tablet Take 1 tablet (75 mg total) by mouth daily. 90 tablet 2  . clorazepate (TRANXENE) 7.5 MG tablet Take 1 tablet (7.5 mg total) by mouth as needed. 30 tablet 5  . dicyclomine (BENTYL) 20 MG tablet Take 1 tablet (20 mg total) by mouth 3 (three) times daily as needed (for abdominal cramping). 50 tablet 5  . docusate sodium (COLACE) 100 MG capsule Take 100 mg by mouth 2 (two) times daily.    . fluticasone (FLONASE) 50 MCG/ACT nasal spray Place 2 sprays into both nostrils daily. 16 g 3  . glucose blood (ONE TOUCH ULTRA TEST) test strip USE TO CHECK BLOOD SUGARS TWICE DAILY AS DIRECTED 100 each 3  . HYDROcodone-acetaminophen (NORCO/VICODIN) 5-325 MG tablet Take 1 tablet by mouth 3 (three) times daily as needed. 90 tablet 0  . insulin NPH Human (HUMULIN N,NOVOLIN N) 100 UNIT/ML injection Inject 38 units into the skin every morning and inject 20 units into the skin at bedtime.    . insulin regular (NOVOLIN R,HUMULIN R) 100 units/mL injection Inject 16 Units into the skin 3 (three) times daily before meals.     . Insulin Syringe-Needle U-100 (INSULIN SYRINGE .5CC/31GX5/16") 31G X 5/16" 0.5 ML MISC Use 5 syringes per day 150 each 3  . irbesartan (AVAPRO) 300 MG tablet TAKE 1 TABLET BY MOUTH DAILY 90 tablet  3  . Liraglutide (VICTOZA) 18 MG/3ML SOPN Inject 1.2 mg daily 6 pen 2  . metFORMIN (GLUCOPHAGE) 500 MG tablet TAKE 1 TABLET BY MOUTH EVERY MORNING AND 2 TABLETS EVERY EVENING (Patient taking differently: TAKE 1 TABLET BY MOUTH EVERY MORNING AND 1 TABLETS EVERY EVENING) 270 tablet 1  . Multiple Vitamin (MULTIVITAMIN) capsule Take 1 capsule by mouth daily.      Marland Kitchen neomycin-polymyxin-hydrocortisone (CORTISPORIN) otic solution 2-3 drops in each ear three times daily 10 mL 2  . niacin (NIASPAN) 500 MG CR tablet Take 1 tablet (500 mg total) by mouth daily. 90 tablet 2  . NOVOFINE 32G X 6 MM MISC USE TO INJECT  INSULIN THREE TIMES DAILY 100 each 3  . omeprazole (PRILOSEC) 40 MG capsule Take 1 capsule (40 mg total) by mouth daily. 90 capsule 3  . VICTOZA 18 MG/3ML SOPN INJECT 1.'2MG'$  AS DIRECTED EVERY DAY 6 mL 5   No current facility-administered medications for this visit.     Allergies  Allergen Reactions  . Aleve [Naproxen Sodium]     Or Advil  . Codeine     REACTION: makes her nervous, orTylenol #3  . Ibuprofen     REACTION: nervous  . Meperidine Hcl     REACTION: nasuea and vomitting  . Naproxen Sodium     REACTION: nervous    Social History   Social History  . Marital status: Widowed    Spouse name: N/A  . Number of children: 2  . Years of education: N/A   Occupational History  . retired-- [employer]    .  Retired   Social History Main Topics  . Smoking status: Never Smoker  . Smokeless tobacco: Never Used     Comment: NEVER SMOKED  . Alcohol use No  . Drug use: No  . Sexual activity: Not on file   Other Topics Concern  . Not on file   Social History Narrative   Lives by herself, lost husband ~ 2004      Review of Systems: General: negative for chills, fever, night sweats or weight changes.  Cardiovascular: negative for chest pain, dyspnea on exertion, edema, orthopnea, palpitations, paroxysmal nocturnal dyspnea or shortness of breath Dermatological: negative for rash Respiratory: negative for cough or wheezing Urologic: negative for hematuria Abdominal: negative for nausea, vomiting, diarrhea, bright red blood per rectum, melena, or hematemesis Neurologic: negative for visual changes, syncope, or dizziness All other systems reviewed and are otherwise negative except as noted above.    Blood pressure (!) 144/86, pulse 72, height '5\' 6"'$  (1.676 m), weight 173 lb (78.5 kg).  General appearance: alert and no distress Neck: no adenopathy, no carotid bruit, no JVD, supple, symmetrical, trachea midline and thyroid not enlarged, symmetric, no  tenderness/mass/nodules Lungs: clear to auscultation bilaterally Heart: regular rate and rhythm, S1, S2 normal, no murmur, click, rub or gallop Extremities: extremities normal, atraumatic, no cyanosis or edema  EKG not performed today  ASSESSMENT AND PLAN:   Coronary atherosclerosis Mrs. Penagos returns today for follow-up of her Myoview stress test which was performed on 08/05/15 which showed an area of anterolateral ischemia. Given her recent episode of chest pain with several subsequent minor episodes and the fact that her bypass grafts are 76 years old decided to proceed with outpatient cardiac catheterization. She does have moderate renal insufficiency and therefore we will need to limit her contrast load. The patient understands that risks included but are not limited to stroke (1 in 1000), death (1 in  1000), kidney failure [usually temporary] (1 in 500), bleeding (1 in 200), allergic reaction [possibly serious] (1 in 200). The patient understands and agrees to proceed      Lorretta Harp MD [facility], [facility] 09/07/2015 10:18 AM

## 2015-09-08 LAB — CBC WITH DIFFERENTIAL/PLATELET
Basophils Absolute: 0 cells/uL (ref 0–200)
Basophils Relative: 0 %
EOS ABS: 205 {cells}/uL (ref 15–500)
Eosinophils Relative: 1 %
HEMATOCRIT: 34.2 % — AB (ref 35.0–45.0)
Hemoglobin: 12 g/dL (ref 11.7–15.5)
LYMPHS PCT: 72 %
Lymphs Abs: 14760 cells/uL — ABNORMAL HIGH (ref 850–3900)
MCH: 32.4 pg (ref 27.0–33.0)
MCHC: 35.1 g/dL (ref 32.0–36.0)
MCV: 92.4 fL (ref 80.0–100.0)
MONO ABS: 820 {cells}/uL (ref 200–950)
MONOS PCT: 4 %
MPV: 10.7 fL (ref 7.5–12.5)
NEUTROS PCT: 23 %
Neutro Abs: 4715 cells/uL (ref 1500–7800)
Platelets: 194 10*3/uL (ref 140–400)
RBC: 3.7 MIL/uL — ABNORMAL LOW (ref 3.80–5.10)
RDW: 13.9 % (ref 11.0–15.0)
WBC: 20.5 10*3/uL — AB (ref 3.8–10.8)

## 2015-09-08 LAB — PROTIME-INR
INR: 1
PROTHROMBIN TIME: 11 s (ref 9.0–11.5)

## 2015-09-08 LAB — TSH: TSH: 1.41 m[IU]/L

## 2015-09-08 LAB — PATHOLOGIST SMEAR REVIEW

## 2015-09-08 LAB — APTT: APTT: 25 s (ref 22–34)

## 2015-09-11 ENCOUNTER — Other Ambulatory Visit: Payer: Self-pay | Admitting: Internal Medicine

## 2015-09-11 ENCOUNTER — Other Ambulatory Visit: Payer: Self-pay | Admitting: Endocrinology

## 2015-09-16 ENCOUNTER — Encounter (HOSPITAL_COMMUNITY)
Admission: RE | Disposition: A | Discharge status: Home / Self Care | Payer: Self-pay | Source: Ambulatory Visit | Attending: Cardiovascular Disease

## 2015-09-16 ENCOUNTER — Encounter (HOSPITAL_COMMUNITY): Payer: Self-pay | Admitting: General Practice

## 2015-09-16 ENCOUNTER — Ambulatory Visit (HOSPITAL_COMMUNITY)
Admission: RE | Admit: 2015-09-16 | Discharge: 2015-09-17 | Disposition: A | Discharge status: Home / Self Care | Payer: Medicare Other | Source: Ambulatory Visit | Attending: Cardiovascular Disease | Admitting: Cardiovascular Disease

## 2015-09-16 DIAGNOSIS — I25719 Atherosclerosis of autologous vein coronary artery bypass graft(s) with unspecified angina pectoris: Secondary | ICD-10-CM | POA: Diagnosis not present

## 2015-09-16 DIAGNOSIS — Z7982 Long term (current) use of aspirin: Secondary | ICD-10-CM | POA: Diagnosis not present

## 2015-09-16 DIAGNOSIS — Z7902 Long term (current) use of antithrombotics/antiplatelets: Secondary | ICD-10-CM | POA: Insufficient documentation

## 2015-09-16 DIAGNOSIS — E119 Type 2 diabetes mellitus without complications: Secondary | ICD-10-CM | POA: Insufficient documentation

## 2015-09-16 DIAGNOSIS — Z7984 Long term (current) use of oral hypoglycemic drugs: Secondary | ICD-10-CM | POA: Diagnosis not present

## 2015-09-16 DIAGNOSIS — D62 Acute posthemorrhagic anemia: Secondary | ICD-10-CM | POA: Diagnosis not present

## 2015-09-16 DIAGNOSIS — Y840 Cardiac catheterization as the cause of abnormal reaction of the patient, or of later complication, without mention of misadventure at the time of the procedure: Secondary | ICD-10-CM | POA: Insufficient documentation

## 2015-09-16 DIAGNOSIS — E785 Hyperlipidemia, unspecified: Secondary | ICD-10-CM | POA: Diagnosis not present

## 2015-09-16 DIAGNOSIS — I25708 Atherosclerosis of coronary artery bypass graft(s), unspecified, with other forms of angina pectoris: Secondary | ICD-10-CM | POA: Diagnosis not present

## 2015-09-16 DIAGNOSIS — I97630 Postprocedural hematoma of a circulatory system organ or structure following a cardiac catheterization: Secondary | ICD-10-CM | POA: Diagnosis not present

## 2015-09-16 DIAGNOSIS — R9439 Abnormal result of other cardiovascular function study: Secondary | ICD-10-CM

## 2015-09-16 DIAGNOSIS — I2584 Coronary atherosclerosis due to calcified coronary lesion: Secondary | ICD-10-CM | POA: Diagnosis not present

## 2015-09-16 DIAGNOSIS — I2582 Chronic total occlusion of coronary artery: Secondary | ICD-10-CM | POA: Insufficient documentation

## 2015-09-16 DIAGNOSIS — I2510 Atherosclerotic heart disease of native coronary artery without angina pectoris: Secondary | ICD-10-CM | POA: Diagnosis present

## 2015-09-16 DIAGNOSIS — I252 Old myocardial infarction: Secondary | ICD-10-CM | POA: Insufficient documentation

## 2015-09-16 DIAGNOSIS — E1169 Type 2 diabetes mellitus with other specified complication: Secondary | ICD-10-CM | POA: Diagnosis present

## 2015-09-16 DIAGNOSIS — I10 Essential (primary) hypertension: Secondary | ICD-10-CM | POA: Diagnosis present

## 2015-09-16 DIAGNOSIS — Z8249 Family history of ischemic heart disease and other diseases of the circulatory system: Secondary | ICD-10-CM | POA: Insufficient documentation

## 2015-09-16 DIAGNOSIS — I209 Angina pectoris, unspecified: Secondary | ICD-10-CM

## 2015-09-16 DIAGNOSIS — E1159 Type 2 diabetes mellitus with other circulatory complications: Secondary | ICD-10-CM | POA: Diagnosis present

## 2015-09-16 DIAGNOSIS — Z794 Long term (current) use of insulin: Secondary | ICD-10-CM | POA: Insufficient documentation

## 2015-09-16 HISTORY — DX: Vitamin B12 deficiency anemia, unspecified: D51.9

## 2015-09-16 HISTORY — DX: Type 2 diabetes mellitus without complications: E11.9

## 2015-09-16 HISTORY — DX: Personal history of other medical treatment: Z92.89

## 2015-09-16 HISTORY — DX: Personal history of other diseases of the digestive system: Z87.19

## 2015-09-16 HISTORY — DX: Angina pectoris, unspecified: I20.9

## 2015-09-16 HISTORY — DX: Unspecified osteoarthritis, unspecified site: M19.90

## 2015-09-16 HISTORY — DX: Pneumonia, unspecified organism: J18.9

## 2015-09-16 HISTORY — DX: Postprocedural hematoma of a circulatory system organ or structure following a cardiac catheterization: I97.630

## 2015-09-16 HISTORY — DX: Acute posthemorrhagic anemia: D62

## 2015-09-16 HISTORY — PX: CARDIAC CATHETERIZATION: SHX172

## 2015-09-16 HISTORY — DX: Anemia, unspecified: D64.9

## 2015-09-16 HISTORY — DX: Other specified postprocedural states: Z98.890

## 2015-09-16 LAB — GLUCOSE, CAPILLARY
GLUCOSE-CAPILLARY: 217 mg/dL — AB (ref 65–99)
GLUCOSE-CAPILLARY: 140 mg/dL — AB (ref 65–99)
Glucose-Capillary: 158 mg/dL — ABNORMAL HIGH (ref 65–99)
Glucose-Capillary: 155 mg/dL — ABNORMAL HIGH (ref 65–99)

## 2015-09-16 SURGERY — RIGHT/LEFT HEART CATH AND CORONARY/GRAFT ANGIOGRAPHY
Anesthesia: LOCAL

## 2015-09-16 MED ORDER — SODIUM CHLORIDE 0.9 % IV SOLN: 250.0000 mL | INTRAVENOUS | Status: DC | PRN

## 2015-09-16 MED ORDER — ACETAMINOPHEN 325 MG PO TABS: 650.0000 mg | ORAL_TABLET | ORAL | Status: DC | PRN

## 2015-09-16 MED ORDER — SODIUM CHLORIDE 0.9% FLUSH
3.0000 mL | Freq: Two times a day (BID) | INTRAVENOUS | Status: DC
  Administered 2015-09-16: 3 mL via INTRAVENOUS

## 2015-09-16 MED ORDER — SODIUM CHLORIDE 0.9% FLUSH: 3.0000 mL | INTRAVENOUS | Status: DC | PRN

## 2015-09-16 MED ORDER — IRBESARTAN 300 MG PO TABS
300.0000 mg | ORAL_TABLET | Freq: Every day | ORAL | Status: DC
  Administered 2015-09-17: 09:00:00 300 mg via ORAL
  Filled 2015-09-16: qty 1

## 2015-09-16 MED ORDER — ONDANSETRON HCL 4 MG/2ML IJ SOLN: 4.0000 mg | Freq: Four times a day (QID) | INTRAMUSCULAR | Status: DC | PRN

## 2015-09-16 MED ORDER — HYDRALAZINE HCL 20 MG/ML IJ SOLN: 10.0000 mg | INTRAMUSCULAR | Status: DC | PRN

## 2015-09-16 MED ORDER — HYDROCODONE-ACETAMINOPHEN 5-325 MG PO TABS: 1.0000 | ORAL_TABLET | Freq: Four times a day (QID) | ORAL | Status: DC | PRN

## 2015-09-16 MED ORDER — ASPIRIN 81 MG PO CHEW
CHEWABLE_TABLET | ORAL | Status: AC
  Administered 2015-09-16: 81 mg via ORAL
  Filled 2015-09-16: qty 1

## 2015-09-16 MED ORDER — MIDAZOLAM HCL 2 MG/2ML IJ SOLN
INTRAMUSCULAR | Status: DC | PRN
  Administered 2015-09-16: 1 mg via INTRAVENOUS

## 2015-09-16 MED ORDER — LIDOCAINE HCL (PF) 1 % IJ SOLN
INTRAMUSCULAR | Status: AC
  Filled 2015-09-16: qty 30

## 2015-09-16 MED ORDER — ONDANSETRON HCL 4 MG/2ML IJ SOLN
4.0000 mg | Freq: Four times a day (QID) | INTRAMUSCULAR | Status: DC | PRN
  Administered 2015-09-16: 4 mg via INTRAVENOUS

## 2015-09-16 MED ORDER — AZELASTINE HCL 0.1 % NA SOLN: 2.0000 | Freq: Every evening | NASAL | Status: DC | PRN

## 2015-09-16 MED ORDER — HEPARIN (PORCINE) IN NACL 2-0.9 UNIT/ML-% IJ SOLN
INTRAMUSCULAR | Status: DC | PRN
  Administered 2015-09-16: 1500 mL

## 2015-09-16 MED ORDER — ACETAMINOPHEN 325 MG PO TABS
650.0000 mg | ORAL_TABLET | ORAL | Status: DC | PRN
  Administered 2015-09-16 – 2015-09-17 (×2): 650 mg via ORAL
  Filled 2015-09-16 (×2): qty 2

## 2015-09-16 MED ORDER — HYDRALAZINE HCL 20 MG/ML IJ SOLN
INTRAMUSCULAR | Status: DC | PRN
  Administered 2015-09-16: 10 mg via INTRAVENOUS

## 2015-09-16 MED ORDER — IOPAMIDOL (ISOVUE-370) INJECTION 76%
INTRAVENOUS | Status: AC
  Filled 2015-09-16: qty 125

## 2015-09-16 MED ORDER — LIRAGLUTIDE 18 MG/3ML ~~LOC~~ SOPN
1.2000 mg | PEN_INJECTOR | Freq: Every day | SUBCUTANEOUS | Status: DC
Start: 2015-09-16 — End: 2015-09-16

## 2015-09-16 MED ORDER — SODIUM CHLORIDE 0.9 % WEIGHT BASED INFUSION: 1.0000 mL/kg/h | INTRAVENOUS | Status: DC

## 2015-09-16 MED ORDER — INSULIN ASPART 100 UNIT/ML ~~LOC~~ SOLN
18.0000 [IU] | Freq: Two times a day (BID) | SUBCUTANEOUS | Status: DC
  Administered 2015-09-17: 18 [IU] via SUBCUTANEOUS

## 2015-09-16 MED ORDER — INSULIN NPH (HUMAN) (ISOPHANE) 100 UNIT/ML ~~LOC~~ SUSP
38.0000 [IU] | Freq: Every day | SUBCUTANEOUS | Status: DC
  Administered 2015-09-17: 38 [IU] via SUBCUTANEOUS
  Filled 2015-09-16: qty 10

## 2015-09-16 MED ORDER — FENTANYL CITRATE (PF) 100 MCG/2ML IJ SOLN
25.0000 ug | Freq: Once | INTRAMUSCULAR | Status: AC
  Administered 2015-09-16: 25 ug via INTRAVENOUS

## 2015-09-16 MED ORDER — INSULIN NPH (HUMAN) (ISOPHANE) 100 UNIT/ML ~~LOC~~ SUSP
20.0000 [IU] | Freq: Every day | SUBCUTANEOUS | Status: DC
  Administered 2015-09-16: 22:00:00 20 [IU] via SUBCUTANEOUS
  Filled 2015-09-16: qty 10

## 2015-09-16 MED ORDER — ASPIRIN 81 MG PO CHEW
81.0000 mg | CHEWABLE_TABLET | ORAL | Status: AC
  Administered 2015-09-16: 81 mg via ORAL

## 2015-09-16 MED ORDER — SODIUM CHLORIDE 0.9 % WEIGHT BASED INFUSION
3.0000 mL/kg/h | INTRAVENOUS | Status: DC
  Administered 2015-09-16: 3 mL/kg/h via INTRAVENOUS

## 2015-09-16 MED ORDER — SODIUM CHLORIDE 0.9 % WEIGHT BASED INFUSION: 1.0000 mL/kg/h | INTRAVENOUS | Status: AC

## 2015-09-16 MED ORDER — DOCUSATE SODIUM 100 MG PO CAPS
200.0000 mg | ORAL_CAPSULE | Freq: Every day | ORAL | Status: DC | PRN
Start: 2015-09-16 — End: 2015-09-17

## 2015-09-16 MED ORDER — IOPAMIDOL (ISOVUE-370) INJECTION 76%
INTRAVENOUS | Status: DC | PRN
  Administered 2015-09-16: 75 mL via INTRAVENOUS

## 2015-09-16 MED ORDER — MIDAZOLAM HCL 2 MG/2ML IJ SOLN
INTRAMUSCULAR | Status: AC
  Filled 2015-09-16: qty 2

## 2015-09-16 MED ORDER — CLOPIDOGREL BISULFATE 75 MG PO TABS
75.0000 mg | ORAL_TABLET | Freq: Every day | ORAL | Status: DC
  Administered 2015-09-17: 09:00:00 75 mg via ORAL
  Filled 2015-09-16: qty 1

## 2015-09-16 MED ORDER — SODIUM CHLORIDE 0.9% FLUSH
3.0000 mL | Freq: Two times a day (BID) | INTRAVENOUS | Status: DC
  Administered 2015-09-16 (×2): 3 mL via INTRAVENOUS

## 2015-09-16 MED ORDER — ATENOLOL 50 MG PO TABS
50.0000 mg | ORAL_TABLET | Freq: Every day | ORAL | Status: DC
  Administered 2015-09-17: 50 mg via ORAL
  Filled 2015-09-16: qty 1

## 2015-09-16 MED ORDER — FENTANYL CITRATE (PF) 100 MCG/2ML IJ SOLN
INTRAMUSCULAR | Status: AC
  Filled 2015-09-16: qty 2

## 2015-09-16 MED ORDER — ONDANSETRON HCL 4 MG/2ML IJ SOLN
INTRAMUSCULAR | Status: AC
  Filled 2015-09-16: qty 2

## 2015-09-16 MED ORDER — LIDOCAINE HCL (PF) 1 % IJ SOLN
INTRAMUSCULAR | Status: DC | PRN
  Administered 2015-09-16: 20 mL

## 2015-09-16 MED ORDER — ATORVASTATIN CALCIUM 20 MG PO TABS
20.0000 mg | ORAL_TABLET | Freq: Every day | ORAL | Status: DC
  Administered 2015-09-16: 22:00:00 20 mg via ORAL
  Filled 2015-09-16: qty 1

## 2015-09-16 MED ORDER — HEPARIN (PORCINE) IN NACL 2-0.9 UNIT/ML-% IJ SOLN
INTRAMUSCULAR | Status: AC
  Filled 2015-09-16: qty 1000

## 2015-09-16 MED ORDER — HYDRALAZINE HCL 20 MG/ML IJ SOLN
INTRAMUSCULAR | Status: AC
  Filled 2015-09-16: qty 1

## 2015-09-16 MED ORDER — FENTANYL CITRATE (PF) 100 MCG/2ML IJ SOLN
INTRAMUSCULAR | Status: DC | PRN
  Administered 2015-09-16: 25 ug via INTRAVENOUS

## 2015-09-16 SURGICAL SUPPLY — 9 items
CATH INFINITI 5FR MULTPACK ANG (CATHETERS) ×2 IMPLANT
KIT HEART LEFT (KITS) ×2 IMPLANT
KIT HEART RIGHT NAMIC (KITS) ×2 IMPLANT
PACK CARDIAC CATHETERIZATION (CUSTOM PROCEDURE TRAY) ×2 IMPLANT
SHEATH PINNACLE 5F 10CM (SHEATH) ×2 IMPLANT
SYR MEDRAD MARK V 150ML (SYRINGE) ×2 IMPLANT
TRANSDUCER W/STOPCOCK (MISCELLANEOUS) ×2 IMPLANT
WIRE EMERALD 3MM-J .035X150CM (WIRE) ×2 IMPLANT
WIRE HI TORQ VERSACORE-J 145CM (WIRE) ×2 IMPLANT

## 2015-09-16 NOTE — H&P (View-Only) (Signed)
09/07/2015 [facility]   [birth year]  [id number]  Primary Physician Kathlene November, MD Primary Cardiologist: Lorretta Harp MD Renae Gloss  HPI:  [name] is a 75 year old mildly overweight widowed Caucasian female mother of 2 living children (son at age 10 died of an overdose), grandmother of 5 grandchildren is a patient of Dr. Terance Ice. I last saw her in the office 07/27/15. Her cardiac risk factor profile is walkover treated diabetes, hypertension and hyperlipidemia. She has a strong family history of heart disease with a father who died of a myocardial infarction at age 7 and her mother at age 62. She had a heart attack in 1997 at which time I performed cardiac catheterization. Apparently I placed a balloon pump at that time and sent her to open heart surgery. Dr. Tharon Aquas Trigt performed coronary artery bypass grafting x5. She had her last catheterization performed by Dr. Rollene Fare December 2008 revealing patent grafts with normal LV function. She had a negative Myoview back in 2012. Since I saw her years ago she's had several episodes of chest pain most recent one this past Sunday which was more intense and prolonged.She had a Myoview stress test performed 08/05/15 that showed new anterolateral ischemia. She's had several episodes of chest pain since I saw her back a month ago.   Current Outpatient Prescriptions  Medication Sig Dispense Refill  . aspirin 325 MG tablet Take 325 mg by mouth daily.    Marland Kitchen atenolol (TENORMIN) 50 MG tablet Take 1 tablet (50 mg total) by mouth daily. 90 tablet 2  . atorvastatin (LIPITOR) 20 MG tablet TAKE 1 TABLET(20 MG) BY MOUTH DAILY 30 tablet 9  . azelastine (ASTELIN) 0.1 % nasal spray Place 2 sprays into both nostrils at bedtime as needed for rhinitis. Use in each nostril as directed 30 mL 3  . Blood Glucose Monitoring Suppl (ONE TOUCH ULTRA SYSTEM KIT) W/DEVICE KIT 1 kit by Does not apply route once. 1 each 0  . cholecalciferol  (VITAMIN D) 1000 UNITS tablet Take 1,000 Units by mouth daily.      . clopidogrel (PLAVIX) 75 MG tablet Take 1 tablet (75 mg total) by mouth daily. 90 tablet 2  . clorazepate (TRANXENE) 7.5 MG tablet Take 1 tablet (7.5 mg total) by mouth as needed. 30 tablet 5  . dicyclomine (BENTYL) 20 MG tablet Take 1 tablet (20 mg total) by mouth 3 (three) times daily as needed (for abdominal cramping). 50 tablet 5  . docusate sodium (COLACE) 100 MG capsule Take 100 mg by mouth 2 (two) times daily.    . fluticasone (FLONASE) 50 MCG/ACT nasal spray Place 2 sprays into both nostrils daily. 16 g 3  . glucose blood (ONE TOUCH ULTRA TEST) test strip USE TO CHECK BLOOD SUGARS TWICE DAILY AS DIRECTED 100 each 3  . HYDROcodone-acetaminophen (NORCO/VICODIN) 5-325 MG tablet Take 1 tablet by mouth 3 (three) times daily as needed. 90 tablet 0  . insulin NPH Human (HUMULIN N,NOVOLIN N) 100 UNIT/ML injection Inject 38 units into the skin every morning and inject 20 units into the skin at bedtime.    . insulin regular (NOVOLIN R,HUMULIN R) 100 units/mL injection Inject 16 Units into the skin 3 (three) times daily before meals.     . Insulin Syringe-Needle U-100 (INSULIN SYRINGE .5CC/31GX5/16") 31G X 5/16" 0.5 ML MISC Use 5 syringes per day 150 each 3  . irbesartan (AVAPRO) 300 MG tablet TAKE 1 TABLET BY MOUTH DAILY 90 tablet  3  . Liraglutide (VICTOZA) 18 MG/3ML SOPN Inject 1.2 mg daily 6 pen 2  . metFORMIN (GLUCOPHAGE) 500 MG tablet TAKE 1 TABLET BY MOUTH EVERY MORNING AND 2 TABLETS EVERY EVENING (Patient taking differently: TAKE 1 TABLET BY MOUTH EVERY MORNING AND 1 TABLETS EVERY EVENING) 270 tablet 1  . Multiple Vitamin (MULTIVITAMIN) capsule Take 1 capsule by mouth daily.      Marland Kitchen neomycin-polymyxin-hydrocortisone (CORTISPORIN) otic solution 2-3 drops in each ear three times daily 10 mL 2  . niacin (NIASPAN) 500 MG CR tablet Take 1 tablet (500 mg total) by mouth daily. 90 tablet 2  . NOVOFINE 32G X 6 MM MISC USE TO INJECT  INSULIN THREE TIMES DAILY 100 each 3  . omeprazole (PRILOSEC) 40 MG capsule Take 1 capsule (40 mg total) by mouth daily. 90 capsule 3  . VICTOZA 18 MG/3ML SOPN INJECT 1.'2MG'$  AS DIRECTED EVERY DAY 6 mL 5   No current facility-administered medications for this visit.     Allergies  Allergen Reactions  . Aleve [Naproxen Sodium]     Or Advil  . Codeine     REACTION: makes her nervous, orTylenol #3  . Ibuprofen     REACTION: nervous  . Meperidine Hcl     REACTION: nasuea and vomitting  . Naproxen Sodium     REACTION: nervous    Social History   Social History  . Marital status: Widowed    Spouse name: N/A  . Number of children: 2  . Years of education: N/A   Occupational History  . retired-- [employer]    .  Retired   Social History Main Topics  . Smoking status: Never Smoker  . Smokeless tobacco: Never Used     Comment: NEVER SMOKED  . Alcohol use No  . Drug use: No  . Sexual activity: Not on file   Other Topics Concern  . Not on file   Social History Narrative   Lives by herself, lost husband ~ 2004      Review of Systems: General: negative for chills, fever, night sweats or weight changes.  Cardiovascular: negative for chest pain, dyspnea on exertion, edema, orthopnea, palpitations, paroxysmal nocturnal dyspnea or shortness of breath Dermatological: negative for rash Respiratory: negative for cough or wheezing Urologic: negative for hematuria Abdominal: negative for nausea, vomiting, diarrhea, bright red blood per rectum, melena, or hematemesis Neurologic: negative for visual changes, syncope, or dizziness All other systems reviewed and are otherwise negative except as noted above.    Blood pressure (!) 144/86, pulse 72, height '5\' 6"'$  (1.676 m), weight 173 lb (78.5 kg).  General appearance: alert and no distress Neck: no adenopathy, no carotid bruit, no JVD, supple, symmetrical, trachea midline and thyroid not enlarged, symmetric, no  tenderness/mass/nodules Lungs: clear to auscultation bilaterally Heart: regular rate and rhythm, S1, S2 normal, no murmur, click, rub or gallop Extremities: extremities normal, atraumatic, no cyanosis or edema  EKG not performed today  ASSESSMENT AND PLAN:   Coronary atherosclerosis [name] returns today for follow-up of her Myoview stress test which was performed on 08/05/15 which showed an area of anterolateral ischemia. Given her recent episode of chest pain with several subsequent minor episodes and the fact that her bypass grafts are 76 years old decided to proceed with outpatient cardiac catheterization. She does have moderate renal insufficiency and therefore we will need to limit her contrast load. The patient understands that risks included but are not limited to stroke (1 in 1000), death (1 in  1000), kidney failure [usually temporary] (1 in 500), bleeding (1 in 200), allergic reaction [possibly serious] (1 in 200). The patient understands and agrees to proceed      Lorretta Harp MD [facility], [facility] 09/07/2015 10:18 AM

## 2015-09-16 NOTE — Progress Notes (Signed)
Pt's bedrest up. Pt ambulated with standby assistance 600 ft. No c/o pain or discomort.  Rt groin level 0

## 2015-09-16 NOTE — Care Management Note (Addendum)
Case Management Note  Patient Details  Name: [name] MRN: [id number] Date of Birth: 1940/10/18  Subjective/Objective:   Patient s/p heart cath, pta indep, she is from home, she has pcp and medication coverage and transportation at dc. NCM will cont to follow for dc needs.                 Action/Plan:   Expected Discharge Date:                  Expected Discharge Plan:  Home/Self Care  In-House Referral:     Discharge planning Services  CM Consult  Post Acute Care Choice:    Choice offered to:     DME Arranged:    DME Agency:     HH Arranged:    HH Agency:     Status of Service:  In process, will continue to follow  If discussed at Long Length of Stay Meetings, dates discussed:    Additional Comments:  Zenon Mayo, RN 09/16/2015, 2:33 PM

## 2015-09-16 NOTE — Progress Notes (Signed)
Site area: Right groin a 5 french arterial sheath was removed  Site Prior to Removal:  Level 2  Pressure Applied For 35 MINUTES    Bedrest Beginning at 1200n  Manual:   Yes.    Patient Status During Pull:  stable  Post Pull Groin Site:  Level 0  Post Pull Instructions Given:  Yes.    Post Pull Pulses Present:  Yes.    Dressing Applied:  Yes.    Comments:  BP dropped to 98/49 ,  Bolus  Fluids of 500cc given, and pt in [place].  BP increased to 132/53.

## 2015-09-16 NOTE — Interval H&P Note (Signed)
Cath Lab Visit (complete for each Cath Lab visit)  Clinical Evaluation Leading to the Procedure:   ACS: No.  Non-ACS:    Anginal Classification: CCS III  Anti-ischemic medical therapy: Minimal Therapy (1 class of medications)  Non-Invasive Test Results: Intermediate-risk stress test findings: cardiac mortality 1-3%/year  Prior CABG: Previous CABG      History and Physical Interval Note:  09/16/2015 9:48 AM  [place]  has presented today for surgery, with the diagnosis of cad, abnormal nuclear stress test  The various methods of treatment have been discussed with the patient and family. After consideration of risks, benefits and other options for treatment, the patient has consented to  Procedure(s): Right/Left Heart Cath and Coronary/Graft Angiography (N/A) as a surgical intervention .  The patient's history has been reviewed, patient examined, no change in status, stable for surgery.  I have reviewed the patient's chart and labs.  Questions were answered to the patient's satisfaction.     Quay Burow

## 2015-09-17 ENCOUNTER — Other Ambulatory Visit: Payer: Self-pay | Admitting: Cardiology

## 2015-09-17 ENCOUNTER — Encounter (HOSPITAL_COMMUNITY): Payer: Self-pay | Admitting: Cardiology

## 2015-09-17 DIAGNOSIS — R9439 Abnormal result of other cardiovascular function study: Secondary | ICD-10-CM | POA: Diagnosis not present

## 2015-09-17 DIAGNOSIS — I97630 Postprocedural hematoma of a circulatory system organ or structure following a cardiac catheterization: Secondary | ICD-10-CM

## 2015-09-17 DIAGNOSIS — E875 Hyperkalemia: Secondary | ICD-10-CM

## 2015-09-17 DIAGNOSIS — I2582 Chronic total occlusion of coronary artery: Secondary | ICD-10-CM | POA: Diagnosis not present

## 2015-09-17 DIAGNOSIS — Z9889 Other specified postprocedural states: Secondary | ICD-10-CM

## 2015-09-17 DIAGNOSIS — D62 Acute posthemorrhagic anemia: Secondary | ICD-10-CM

## 2015-09-17 DIAGNOSIS — I209 Angina pectoris, unspecified: Secondary | ICD-10-CM | POA: Diagnosis not present

## 2015-09-17 DIAGNOSIS — I25719 Atherosclerosis of autologous vein coronary artery bypass graft(s) with unspecified angina pectoris: Secondary | ICD-10-CM | POA: Diagnosis not present

## 2015-09-17 DIAGNOSIS — I252 Old myocardial infarction: Secondary | ICD-10-CM | POA: Diagnosis not present

## 2015-09-17 DIAGNOSIS — I2584 Coronary atherosclerosis due to calcified coronary lesion: Secondary | ICD-10-CM | POA: Diagnosis not present

## 2015-09-17 HISTORY — DX: Acute posthemorrhagic anemia: D62

## 2015-09-17 HISTORY — DX: Other specified postprocedural states: Z98.890

## 2015-09-17 HISTORY — DX: Postprocedural hematoma of a circulatory system organ or structure following a cardiac catheterization: I97.630

## 2015-09-17 LAB — BASIC METABOLIC PANEL
ANION GAP: 5 (ref 5–15)
BUN: 13 mg/dL (ref 6–20)
CO2: 25 mmol/L (ref 22–32)
CREATININE: 1.31 mg/dL — AB (ref 0.44–1.00)
Calcium: 8.5 mg/dL — ABNORMAL LOW (ref 8.9–10.3)
Chloride: 113 mmol/L — ABNORMAL HIGH (ref 101–111)
GFR calc non Af Amer: 38 mL/min — ABNORMAL LOW (ref >60)
GFR, EST AFRICAN AMERICAN: 45 mL/min — AB (ref >60)
Glucose, Bld: 144 mg/dL — ABNORMAL HIGH (ref 65–99)
Potassium: 4 mmol/L (ref 3.5–5.1)
SODIUM: 143 mmol/L (ref 135–145)

## 2015-09-17 LAB — CBC
HCT: 27.5 % — ABNORMAL LOW (ref 36.0–46.0)
Hemoglobin: 8.8 g/dL — ABNORMAL LOW (ref 12.0–15.0)
MCH: 30.8 pg (ref 26.0–34.0)
MCHC: 32 g/dL (ref 30.0–36.0)
MCV: 96.2 fL (ref 78.0–100.0)
PLATELETS: 160 10*3/uL (ref 150–400)
RBC: 2.86 MIL/uL — ABNORMAL LOW (ref 3.87–5.11)
RDW: 14.4 % (ref 11.5–15.5)
WBC: 15.3 10*3/uL — AB (ref 4.0–10.5)

## 2015-09-17 LAB — GLUCOSE, CAPILLARY
GLUCOSE-CAPILLARY: 138 mg/dL — AB (ref 65–99)
Glucose-Capillary: 141 mg/dL — ABNORMAL HIGH (ref 65–99)

## 2015-09-17 MED ORDER — ASPIRIN EC 81 MG PO TBEC
81.0000 mg | DELAYED_RELEASE_TABLET | Freq: Every day | ORAL | Status: DC
Start: 2015-09-17 — End: 2015-09-17

## 2015-09-17 MED ORDER — TRAMADOL HCL 50 MG PO TABS: 50.0000 mg | ORAL_TABLET | Freq: Four times a day (QID) | ORAL | 0 refills | Status: DC | PRN

## 2015-09-17 MED ORDER — METFORMIN HCL 500 MG PO TABS: ORAL_TABLET | ORAL | 0 refills | Status: DC

## 2015-09-17 MED ORDER — ISOSORBIDE MONONITRATE ER 30 MG PO TB24: 15.0000 mg | ORAL_TABLET | Freq: Every day | ORAL | 6 refills | Status: DC

## 2015-09-17 MED ORDER — ASPIRIN 81 MG PO CHEW: 81.0000 mg | CHEWABLE_TABLET | Freq: Every day | ORAL | Status: DC

## 2015-09-17 MED ORDER — NIACIN ER (ANTIHYPERLIPIDEMIC) 500 MG PO TBCR: 500.0000 mg | EXTENDED_RELEASE_TABLET | Freq: Every evening | ORAL | Status: DC

## 2015-09-17 MED ORDER — ASPIRIN 81 MG PO TBEC: 81.0000 mg | DELAYED_RELEASE_TABLET | Freq: Every day | ORAL | Status: DC

## 2015-09-17 MED ORDER — LIRAGLUTIDE 18 MG/3ML ~~LOC~~ SOPN: 1.2000 mg | PEN_INJECTOR | Freq: Every day | SUBCUTANEOUS | Status: DC

## 2015-09-17 MED ORDER — ACETAMINOPHEN 325 MG PO TABS: 650.0000 mg | ORAL_TABLET | ORAL | Status: DC | PRN

## 2015-09-17 NOTE — Progress Notes (Signed)
Subjective:  No CP/SOB. S/P cath with right groin hematoma, looks good today  Objective:  Temp:  [96.7 F (35.9 C)-98.3 F (36.8 C)] 97.7 F (36.5 C) (09/15 0800) Pulse Rate:  [74-101] 74 (09/15 0800) Resp:  [7-38] 16 (09/15 0800) BP: (86-186)/(34-94) 125/59 (09/15 0800) SpO2:  [92 %-100 %] 96 % (09/15 0800) Weight:  [178 lb 4.8 oz (80.9 kg)] 178 lb 4.8 oz (80.9 kg) (09/15 0449) Weight change:   Intake/Output from previous day: 09/14 0701 - 09/15 0700 In: 360 [P.O.:360] Out: 1250 [Urine:1250]  Intake/Output from this shift: Total I/O In: 360 [P.O.:360] Out: -   Physical Exam: General appearance: alert and no distress Neck: no adenopathy, no carotid bruit, no JVD, supple, symmetrical, trachea midline and thyroid not enlarged, symmetric, no tenderness/mass/nodules Lungs: clear to auscultation bilaterally Heart: regular rate and rhythm, S1, S2 normal, no murmur, click, rub or gallop Extremities: extremities normal, atraumatic, no cyanosis or edema  Lab Results: Results for orders placed or performed during the hospital encounter of 09/16/15 (from the past 48 hour(s))  Glucose, capillary     Status: Abnormal   Collection Time: 09/16/15  7:33 AM  Result Value Ref Range   Glucose-Capillary 158 (H) 65 - 99 mg/dL  Glucose, capillary     Status: Abnormal   Collection Time: 09/16/15 11:14 AM  Result Value Ref Range   Glucose-Capillary 155 (H) 65 - 99 mg/dL  Glucose, capillary     Status: Abnormal   Collection Time: 09/16/15  4:58 PM  Result Value Ref Range   Glucose-Capillary 217 (H) 65 - 99 mg/dL  Glucose, capillary     Status: Abnormal   Collection Time: 09/16/15  9:06 PM  Result Value Ref Range   Glucose-Capillary 140 (H) 65 - 99 mg/dL  CBC     Status: Abnormal   Collection Time: 09/17/15  4:38 AM  Result Value Ref Range   WBC 15.3 (H) 4.0 - 10.5 K/uL   RBC 2.86 (L) 3.87 - 5.11 MIL/uL   Hemoglobin 8.8 (L) 12.0 - 15.0 g/dL   HCT 27.5 (L) 36.0 - 46.0 %   MCV  96.2 78.0 - 100.0 fL   MCH 30.8 26.0 - 34.0 pg   MCHC 32.0 30.0 - 36.0 g/dL   RDW 14.4 11.5 - 15.5 %   Platelets 160 150 - 400 K/uL  Basic metabolic panel     Status: Abnormal   Collection Time: 09/17/15  4:38 AM  Result Value Ref Range   Sodium 143 135 - 145 mmol/L   Potassium 4.0 3.5 - 5.1 mmol/L   Chloride 113 (H) 101 - 111 mmol/L   CO2 25 22 - 32 mmol/L   Glucose, Bld 144 (H) 65 - 99 mg/dL   BUN 13 6 - 20 mg/dL   Creatinine, Ser 1.31 (H) 0.44 - 1.00 mg/dL   Calcium 8.5 (L) 8.9 - 10.3 mg/dL   GFR calc non Af Amer 38 (L) >60 mL/min   GFR calc Af Amer 45 (L) >60 mL/min    Comment: (NOTE) The eGFR has been calculated using the CKD EPI equation. This calculation has not been validated in all clinical situations. eGFR's persistently <60 mL/min signify possible Chronic Kidney Disease.    Anion gap 5 5 - 15  Glucose, capillary     Status: Abnormal   Collection Time: 09/17/15  6:07 AM  Result Value Ref Range   Glucose-Capillary 141 (H) 65 - 99 mg/dL    Imaging: Imaging results have  been reviewed  Tele- NSR  Assessment/Plan:   1. Active Problems: 2.   Positive cardiac stress test 3.   Ischemic chest pain ([place]) 4.   Coronary artery disease involving coronary bypass graft 5.   CAD (coronary artery disease) 6.   Time Spent Directly with Patient:  20 minutes  Length of Stay:  LOS: 0 days   S/P LHC revealing patent grafts and nl LV fxn. She did have a hematoma post procedure which was beautifully managed!!! Groin looks good. HGB decreased 12---> 8.8. Exam benign. Cause of DOE and abn MV not clear. Will start on low dose imdur. OK for DC home this AM. ROV with [name] 2 weeks and with me 4-6 weeks  Quay Burow 09/17/2015, 9:12 AM

## 2015-09-17 NOTE — Care Management Note (Signed)
Case Management Note  Patient Details  Name: [name] MRN: [id number] Date of Birth: 1940-10-25  Subjective/Objective:   Patient s/p heart cath, pta indep, she is from home, she has pcp and medication coverage and transportation at dc, no needs.                 Action/Plan:   Expected Discharge Date:                  Expected Discharge Plan:  Home/Self Care  In-House Referral:     Discharge planning Services  CM Consult  Post Acute Care Choice:    Choice offered to:     DME Arranged:    DME Agency:     HH Arranged:    HH Agency:     Status of Service:  Completed, signed off  If discussed at [employer] of Stay Meetings, dates discussed:    Additional Comments:  Zenon Mayo, RN 09/17/2015, 12:14 PM

## 2015-09-17 NOTE — Discharge Instructions (Signed)
Call [place] at [phone] if any bleeding, swelling or drainage at cath site.  May shower, no tub baths for 48 hours for groin sticks. No lifting over 5 pounds for 3 days.  No Driving for 3 days.    We added Imdur to meds ( isosorbide mononitrate) to help prevent SOB.  Heart Healthy Diabetic diet  Hold Metformin until Sunday the 17th it may interact with cath dye otherwise  Have lab work done Tuesday at the [place] office to check anemia and kidney function.

## 2015-09-17 NOTE — Discharge Summary (Signed)
Physician Discharge Summary       Patient ID: [name] MRN: [id number] DOB/AGE: 02-03-1940 75 y.o.  Admit date: 09/16/2015 Discharge date: 09/17/2015 Primary Cardiologist:Dr. Gwenlyn Myers    Discharge Diagnoses:  Principal Problem:   Positive cardiac stress test Active Problems:   Ischemic chest pain (HCC)   S/P cardiac cath 09/16/15   Hyperlipidemia   Essential hypertension   Coronary artery disease involving coronary bypass graft   CAD (coronary artery disease)   Acute blood loss anemia   Postoperative hematoma involving circulatory system following cardiac catheterization   Discharged Condition: good  Procedures: 09/16/15 by Dr. Gwenlyn Myers Procedures   Right/Left Heart Cath and Coronary/Graft Angiography  Conclusion     Ost LM to LM lesion, 90 %stenosed.  Ost Ramus to Ramus lesion, 99 %stenosed.  Ost Cx to Prox Cx lesion, 80 %stenosed.  Prox LAD to Mid LAD lesion, 100 %stenosed.  Mid RCA to Dist RCA lesion, 100 %stenosed.  Ost LAD to Prox LAD lesion, 80 %stenosed.  Ost RCA to Prox RCA lesion, 70 %stenosed.  Prox RCA to Mid RCA lesion, 70 %stenosed.  Post Atrio lesion, 80 %stenosed.  The left ventricular ejection fraction is 50-55% by visual estimate.  The left ventricular systolic function is normal.      Hospital Course: 75 year old mildly overweight widowed Caucasian female mother of 2 living children (son at age 64 died of an overdose), grandmother of 5 grandchildren was a patient of Dr. Terance Myers.  Her cardiac risk factor profile is treated diabetes, hypertension and hyperlipidemia. She has a strong family history of heart disease with a father who died of a myocardial infarction at age 58 and her mother at age 37. She had a heart attack in 1997 at which time cardiac catheterization was done. Apparently she had a balloon pump at that time and was sent to open heart surgery. Dr. Tharon Aquas Myers performed coronary artery bypass grafting x5. She had  her last catheterization performed by Dr. Rollene Myers December 2008 revealing patent grafts with normal LV function. She had a negative Myoview back in 2012.  She's had several episodes of chest pain most recent one this past Sunday (prior to OV) which was more intense and prolonged.She had a Myoview stress test performed 08/05/15 that showed new anterolateral ischemia. She's had several episodes of chest pain since.  She was seen and plans made for cardiac cath.  Results as above  Dr. Gwenlyn Myers suspected the anterolateral ischemia is related to ischemia in the circumflex territory which is not bypassed. This is too small for intervention. She also has distal left main disease as well. Medical therapy will be recommended.  Imdur 15 mg added.  Post procedure she did have a hematoma which was beautifully managed!!! Groin looks good. HGB decreased 12---> 8.8. Exam benign. Cause of DOE and abn MV not clear. Will start on low dose imdur. She was seen and Myers stable for DC home this AM. ROV with [name] 2 weeks and with Dr. Adora Myers 4-6 weeks  With walking prior to discharge her groin was painful so Ultram was prescribed enough for 1 week.  I asked family to stay with her for 24 hours and to call if pain increased.  With drop in Hgb due to acute blood loss anemia will recheck CBC and BMP on the 19th.   We also decreased ASA to 81 mg.   Consults: None  Significant Diagnostic Studies:  BMP Latest Ref Rng & Units 09/17/2015 09/07/2015 08/30/2015  Glucose 65 - 99 mg/dL 144(H) 104(H) 106  BUN 6 - 20 mg/dL 13 16 26.9(H)  Creatinine 0.44 - 1.00 mg/dL 1.31(H) 1.39(H) 1.6(H)  Sodium 135 - 145 mmol/L 143 146 144  Potassium 3.5 - 5.1 mmol/L 4.0 4.5 4.7  Chloride 101 - 111 mmol/L 113(H) 115(H) -  CO2 22 - 32 mmol/L _0 Calcium 8.9 - 10.3 mg/dL 8.5(L) 9.3 9.7   CBC Latest Ref Rng & Units 09/17/2015 09/07/2015 08/30/2015  WBC 4.0 - 10.5 K/uL 15.3(H) 20.5(H) 24.1(H)  Hemoglobin 12.0 - 15.0 g/dL 8.8(L) 12.0 11.9    Hematocrit 36.0 - 46.0 % 27.5(L) 34.2(L) 35.8  Platelets 150 - 400 K/uL 160 194 192     Discharge Exam: Blood pressure (!) 139/58, pulse 73, temperature 97.9 F (36.6 C), temperature source Oral, resp. rate 16, height _1  (1.676 m), weight 178 lb 4.8 oz (80.9 kg), SpO2 95 %.   Disposition: Home     Medication List    STOP taking these medications   aspirin 325 MG tablet Replaced by:  aspirin 81 MG EC tablet     TAKE these medications   acetaminophen 325 MG tablet Commonly known as:  TYLENOL Take 2 tablets (650 mg total) by mouth every 4 (four) hours as needed for headache or mild pain.   aspirin 81 MG EC tablet Take 1 tablet (81 mg total) by mouth daily. Replaces:  aspirin 325 MG tablet   atenolol 50 MG tablet Commonly known as:  TENORMIN Take 1 tablet (50 mg total) by mouth daily.   atorvastatin 20 MG tablet Commonly known as:  LIPITOR TAKE 1 TABLET(20 MG) BY MOUTH DAILY   azelastine 0.1 % nasal spray Commonly known as:  ASTELIN Place 2 sprays into both nostrils at bedtime as needed for rhinitis. Use in each nostril as directed   cholecalciferol 1000 units tablet Commonly known as:  VITAMIN D Take 1,000 Units by mouth daily.   clopidogrel 75 MG tablet Commonly known as:  PLAVIX Take 1 tablet (75 mg total) by mouth daily.   clorazepate 7.5 MG tablet Commonly known as:  TRANXENE Take 1 tablet (7.5 mg total) by mouth as needed. What changed:  when to take this  reasons to take this   dicyclomine 20 MG tablet Commonly known as:  BENTYL Take 1 tablet (20 mg total) by mouth 3 (three) times daily as needed (for abdominal cramping).   docusate sodium 100 MG capsule Commonly known as:  COLACE Take 200 mg by mouth daily as needed for mild constipation.   fluticasone 50 MCG/ACT nasal spray Commonly known as:  FLONASE Place 2 sprays into both nostrils daily. What changed:  when to take this  reasons to take this   glucose blood test strip Commonly  known as:  ONE TOUCH ULTRA TEST USE TO CHECK BLOOD SUGARS TWICE DAILY AS DIRECTED   HYDROcodone-acetaminophen 5-325 MG tablet Commonly known as:  NORCO/VICODIN Take 1 tablet by mouth 3 (three) times daily as needed. What changed:  reasons to take this   insulin NPH Human 100 UNIT/ML injection Commonly known as:  HUMULIN N,NOVOLIN N 20-38 Units 2 (two) times daily before a meal. Inject 38 units into the skin every morning and inject 20 units into the skin at bedtime.   insulin regular 100 units/mL injection Commonly known as:  NOVOLIN R,HUMULIN R Inject 18 Units into the skin 2 (two) times daily before a meal.   INSULIN SYRINGE .5CC/31GX5/16" 31G X 5/16" 0.5 ML  Misc Use 5 syringes per day   irbesartan 300 MG tablet Commonly known as:  AVAPRO TAKE 1 TABLET BY MOUTH DAILY   isosorbide mononitrate 30 MG 24 hr tablet Commonly known as:  IMDUR Take 0.5 tablets (15 mg total) by mouth daily.   Liraglutide 18 MG/3ML Sopn Commonly known as:  VICTOZA Inject 0.2 mLs (1.2 mg total) into the skin at bedtime. Inject 1.2 mg daily What changed:  how much to take  how to take this  when to take this  Another medication with the same name was removed. Continue taking this medication, and follow the directions you see here.   metFORMIN 500 MG tablet Commonly known as:  GLUCOPHAGE TAKE 1 TABLET BY MOUTH EVERY MORNING AND 1 TABLETS EVERY EVENING Start taking on:  09/19/2015 What changed:  See the new instructions.   multivitamin capsule Take 1 capsule by mouth daily.   neomycin-polymyxin-hydrocortisone otic solution Commonly known as:  CORTISPORIN 2-3 drops in each ear three times daily What changed:  how much to take  how to take this  when to take this  reasons to take this  additional instructions   niacin 500 MG CR tablet Commonly known as:  NIASPAN Take 1 tablet (500 mg total) by mouth every evening.   NOVOFINE 32G X 6 MM Misc Generic drug:  Insulin Pen Needle USE  TO INJECT INSULIN THREE TIMES DAILY   omeprazole 40 MG capsule Commonly known as:  PRILOSEC Take 1 capsule (40 mg total) by mouth daily.   ONE TOUCH ULTRA SYSTEM KIT w/Device Kit 1 kit by Does not apply route once.   traMADol 50 MG tablet Commonly known as:  ULTRAM Take 1 tablet (50 mg total) by mouth every 6 (six) hours as needed for moderate pain or severe pain.      Follow-up Information    Sara Ransom, PA-C Follow up on 09/28/2015.   Specialties:  Cardiology, Radiology Why:  at 11:00AM   Contact information: [place] [address] [phone]        Quay Burow, MD Follow up on 10/22/2015.   Specialties:  Cardiology, Radiology Why:  at 10:30 AM   Contact information: [address] [place] [address] [phone]            Discharge Instructions: Call [facility] at [phone] if any bleeding, swelling or drainage at cath site.  May shower, no tub baths for 48 hours for groin sticks. No lifting over 5 pounds for 3 days.  No Driving for 3 days.    We added Imdur to meds ( isosorbide mononitrate) to help prevent SOB.  Heart Healthy Diabetic diet  Hold Metformin until Sunday the 17th it may interact with cath dye otherwise  Have lab work done Tuesday at the [place] office to check anemia and kidney function. Signed: Cecilie Kicks Nurse Practitioner-Certified [facility]: [facility] 09/17/2015, 2:59 PM  Time spent on discharge : > 30 minutes.

## 2015-09-21 DIAGNOSIS — D62 Acute posthemorrhagic anemia: Secondary | ICD-10-CM | POA: Diagnosis not present

## 2015-09-21 DIAGNOSIS — E875 Hyperkalemia: Secondary | ICD-10-CM | POA: Diagnosis not present

## 2015-09-21 LAB — CBC
HEMATOCRIT: 29.8 % — AB (ref 35.0–45.0)
Hemoglobin: 10 g/dL — ABNORMAL LOW (ref 11.7–15.5)
MCH: 31.3 pg (ref 27.0–33.0)
MCHC: 33.6 g/dL (ref 32.0–36.0)
MCV: 93.4 fL (ref 80.0–100.0)
MPV: 10.4 fL (ref 7.5–12.5)
Platelets: 243 10*3/uL (ref 140–400)
RBC: 3.19 MIL/uL — AB (ref 3.80–5.10)
RDW: 13.7 % (ref 11.0–15.0)
WBC: 18 10*3/uL — ABNORMAL HIGH (ref 3.8–10.8)

## 2015-09-22 LAB — BASIC METABOLIC PANEL WITH GFR
BUN: 12 mg/dL (ref 7–25)
CALCIUM: 9.4 mg/dL (ref 8.6–10.4)
CO2: 24 mmol/L (ref 20–31)
Chloride: 109 mmol/L (ref 98–110)
Creat: 1.32 mg/dL — ABNORMAL HIGH (ref 0.60–0.93)
GFR, EST AFRICAN AMERICAN: 45 mL/min — AB (ref >=60)
GFR, EST NON AFRICAN AMERICAN: 39 mL/min — AB (ref >=60)
GLUCOSE: 156 mg/dL — AB (ref 65–99)
POTASSIUM: 4.3 mmol/L (ref 3.5–5.3)
Sodium: 143 mmol/L (ref 135–146)

## 2015-09-28 ENCOUNTER — Encounter: Payer: Self-pay | Admitting: Cardiology

## 2015-09-28 ENCOUNTER — Ambulatory Visit (INDEPENDENT_AMBULATORY_CARE_PROVIDER_SITE_OTHER): Payer: Medicare Other | Admitting: Cardiology

## 2015-09-28 DIAGNOSIS — Z9889 Other specified postprocedural states: Secondary | ICD-10-CM

## 2015-09-28 DIAGNOSIS — I97630 Postprocedural hematoma of a circulatory system organ or structure following a cardiac catheterization: Secondary | ICD-10-CM

## 2015-09-28 DIAGNOSIS — Z951 Presence of aortocoronary bypass graft: Secondary | ICD-10-CM | POA: Diagnosis not present

## 2015-09-28 DIAGNOSIS — I2510 Atherosclerotic heart disease of native coronary artery without angina pectoris: Secondary | ICD-10-CM

## 2015-09-28 DIAGNOSIS — C9110 Chronic lymphocytic leukemia of B-cell type not having achieved remission: Secondary | ICD-10-CM

## 2015-09-28 DIAGNOSIS — N183 Chronic kidney disease, stage 3 unspecified: Secondary | ICD-10-CM

## 2015-09-28 DIAGNOSIS — E1310 Other specified diabetes mellitus with ketoacidosis without coma: Secondary | ICD-10-CM

## 2015-09-28 NOTE — Assessment & Plan Note (Signed)
Patent grafts with native, unprotected, CFX disease and normal LVF-medical Rx

## 2015-09-28 NOTE — Patient Instructions (Signed)
Medication Instructions:  Your physician recommends that you continue on your current medications as directed. Please refer to the Current Medication list given to you today.  Labwork: None   Testing/Procedures: None   Follow-Up: Your physician recommends that you schedule a follow-up appointment in: [place].   Any Other Special Instructions Will Be Listed Below (If Applicable).     If you need a refill on your cardiac medications before your next appointment, please call your pharmacy.

## 2015-09-28 NOTE — Assessment & Plan Note (Signed)
Followed by Dr. Ennever 

## 2015-09-28 NOTE — Progress Notes (Signed)
09/28/2015 [place]   [birth year]  [id number]  Primary Physician Kathlene November, MD Primary Cardiologist: Dr Gwenlyn Found  HPI:  75 y/o female followed by Dr Gwenlyn Found with a long history of CAD. She had CABG in 1997. Recently she had some chest pain worrisome for angina. A Myoview was abnormal in anterior ischemia. She underwent coronary angiogram 09/16/15 which revealed patent grafts with disease in an unprotected CFX. His LVF was normal. The procedure was complicated by a post cath hematoma. F/U HGb was stalbe-10.0. The plan is for continued medical Rx. She is in the office today for follow up. Since discharge she has done pretty well, no angina but she does have some fatigue.    Current Outpatient Prescriptions  Medication Sig Dispense Refill  . aspirin EC 81 MG EC tablet Take 1 tablet (81 mg total) by mouth daily.    Marland Kitchen atenolol (TENORMIN) 50 MG tablet Take 1 tablet (50 mg total) by mouth daily. 90 tablet 2  . atorvastatin (LIPITOR) 20 MG tablet TAKE 1 TABLET(20 MG) BY MOUTH DAILY 30 tablet 9  . azelastine (ASTELIN) 0.1 % nasal spray Place 2 sprays into both nostrils at bedtime as needed for rhinitis. Use in each nostril as directed 30 mL 3  . Blood Glucose Monitoring Suppl (ONE TOUCH ULTRA SYSTEM KIT) W/DEVICE KIT 1 kit by Does not apply route once. 1 each 0  . cholecalciferol (VITAMIN D) 1000 UNITS tablet Take 1,000 Units by mouth daily.      . clopidogrel (PLAVIX) 75 MG tablet Take 1 tablet (75 mg total) by mouth daily. 90 tablet 1  . clorazepate (TRANXENE) 7.5 MG tablet Take 1 tablet (7.5 mg total) by mouth as needed. (Patient taking differently: Take 7.5 mg by mouth daily as needed for anxiety. ) 30 tablet 5  . docusate sodium (COLACE) 100 MG capsule Take 200 mg by mouth daily as needed for mild constipation.     . fluticasone (FLONASE) 50 MCG/ACT nasal spray Place 2 sprays into both nostrils daily. (Patient taking differently: Place 2 sprays into both nostrils daily as needed for allergies.  ) 16 g 3  . glucose blood (ONE TOUCH ULTRA TEST) test strip USE TO CHECK BLOOD SUGARS TWICE DAILY AS DIRECTED 100 each 3  . HYDROcodone-acetaminophen (NORCO/VICODIN) 5-325 MG tablet Take 1 tablet by mouth 3 (three) times daily as needed. (Patient taking differently: Take 1 tablet by mouth 3 (three) times daily as needed for moderate pain. ) 90 tablet 0  . insulin NPH Human (HUMULIN N,NOVOLIN N) 100 UNIT/ML injection 20-38 Units 2 (two) times daily before a meal. Inject 38 units into the skin every morning and inject 20 units into the skin at bedtime.    . insulin regular (NOVOLIN R,HUMULIN R) 100 units/mL injection Inject 18 Units into the skin 2 (two) times daily before a meal.     . irbesartan (AVAPRO) 300 MG tablet TAKE 1 TABLET BY MOUTH DAILY 90 tablet 3  . isosorbide mononitrate (IMDUR) 30 MG 24 hr tablet Take 0.5 tablets (15 mg total) by mouth daily. 16 tablet 6  . Liraglutide (VICTOZA) 18 MG/3ML SOPN Inject 0.2 mLs (1.2 mg total) into the skin at bedtime. Inject 1.2 mg daily    . metFORMIN (GLUCOPHAGE) 500 MG tablet TAKE 1 TABLET BY MOUTH EVERY MORNING AND 1 TABLETS EVERY EVENING 270 tablet 0  . Multiple Vitamin (MULTIVITAMIN) capsule Take 1 capsule by mouth daily.      Marland Kitchen neomycin-polymyxin-hydrocortisone (CORTISPORIN) otic solution 2-3  drops in each ear three times daily (Patient taking differently: Place 3 drops into both ears 3 (three) times daily as needed (Ear Pain). 2-3 drops in each ear three times daily) 10 mL 2  . niacin (NIASPAN) 500 MG CR tablet Take 1 tablet (500 mg total) by mouth every evening.    . omeprazole (PRILOSEC) 40 MG capsule Take 1 capsule (40 mg total) by mouth daily. 90 capsule 3  . traMADol (ULTRAM) 50 MG tablet Take 1 tablet (50 mg total) by mouth every 6 (six) hours as needed for moderate pain or severe pain. 25 tablet 0   No current facility-administered medications for this visit.     Allergies  Allergen Reactions  . Codeine     REACTION: makes her nervous,  orTylenol #3  . Ibuprofen     REACTION: nervous  . Meperidine Hcl     REACTION: nasuea and vomitting  . Naproxen Sodium     REACTION: nervous    Social History   Social History  . Marital status: Widowed    Spouse name: N/A  . Number of children: 2  . Years of education: N/A   Occupational History  . retired-- [employer]    .  Retired   Social History Main Topics  . Smoking status: Never Smoker  . Smokeless tobacco: Never Used  . Alcohol use No  . Drug use: No  . Sexual activity: No   Other Topics Concern  . Not on file   Social History Narrative   Lives by herself, lost husband ~ 2004      Review of Systems: General: negative for chills, fever, night sweats or weight changes.  Cardiovascular: negative for chest pain, dyspnea on exertion, edema, orthopnea, palpitations, paroxysmal nocturnal dyspnea or shortness of breath Dermatological: negative for rash Respiratory: negative for cough or wheezing Urologic: negative for hematuria Abdominal: negative for nausea, vomiting, diarrhea, bright red blood per rectum, melena, or hematemesis Neurologic: negative for visual changes, syncope, or dizziness All other systems reviewed and are otherwise negative except as noted above.    Blood pressure 122/82, pulse 79, height 5' 6" (1.676 m), weight 172 lb 9.6 oz (78.3 kg).  General appearance: alert, cooperative, no distress and mildly obese Neck: no carotid bruit and no JVD Lungs: clear to auscultation bilaterally Heart: regular rate and rhythm Extremities: extremities normal, atraumatic, no cyanosis or edema Neurologic: Grossly normal   ASSESSMENT AND PLAN:   Hx of CABG CABG 1997-patent LIMA-LAD, SVG-RI, SVG-PDA Sept 2017  S/P cardiac cath 09/16/15 Patent grafts with native, unprotected, CFX disease and normal LVF-medical Rx  Chronic kidney disease, stage III (moderate) SCr 1.32 post cath  Post cath hematoma Satble in the offcie today  DM ketoacidosis type  II, uncontrolled-- per endocrinology Dr Kumar follows  CLL (chronic lymphocytic leukemia) Followed by Dr Ennever   PLAN  Same Rx, keep f/u with Dr Berry end of Oct.    PA-C 09/28/2015 12:00 PM 

## 2015-09-28 NOTE — Assessment & Plan Note (Signed)
SCr 1.32 post cath

## 2015-09-28 NOTE — Assessment & Plan Note (Signed)
Satble in the offcie today

## 2015-09-28 NOTE — Assessment & Plan Note (Signed)
CABG 1997 -patent LIMA-LAD, SVG-RI, SVG-PDA Sept 2017 

## 2015-09-28 NOTE — Assessment & Plan Note (Signed)
Dr Dwyane Dee follows

## 2015-10-06 NOTE — Addendum Note (Signed)
Addended by: Zebedee Iba on: 10/06/2015 09:50 AM   Modules accepted: Orders

## 2015-10-22 ENCOUNTER — Encounter: Payer: Self-pay | Admitting: Cardiovascular Disease

## 2015-10-22 ENCOUNTER — Telehealth: Payer: Self-pay | Admitting: Internal Medicine

## 2015-10-22 ENCOUNTER — Ambulatory Visit (INDEPENDENT_AMBULATORY_CARE_PROVIDER_SITE_OTHER): Payer: Medicare Other | Admitting: Cardiovascular Disease

## 2015-10-22 DIAGNOSIS — I10 Essential (primary) hypertension: Secondary | ICD-10-CM | POA: Diagnosis not present

## 2015-10-22 DIAGNOSIS — Z951 Presence of aortocoronary bypass graft: Secondary | ICD-10-CM

## 2015-10-22 DIAGNOSIS — Z780 Asymptomatic menopausal state: Secondary | ICD-10-CM

## 2015-10-22 DIAGNOSIS — I2510 Atherosclerotic heart disease of native coronary artery without angina pectoris: Secondary | ICD-10-CM | POA: Diagnosis not present

## 2015-10-22 DIAGNOSIS — E7800 Pure hypercholesterolemia, unspecified: Secondary | ICD-10-CM

## 2015-10-22 NOTE — Telephone Encounter (Signed)
Bone density re-ordered. Dx: screening for osteoporosis, postmenopausal.

## 2015-10-22 NOTE — Telephone Encounter (Signed)
[name] with [facility] [phone]  Pt wanting to schedule bone density but DX on order needs to be updated. She is requesting call back.

## 2015-10-22 NOTE — Assessment & Plan Note (Signed)
History of hypertension blood pressure measured at 134/76. She is on atenolol and Avapro. She terminates at current dosing

## 2015-10-22 NOTE — Assessment & Plan Note (Signed)
History of coronary artery disease status post bypass grafting in 1997 by Dr. Prescott Gum after catheterization performed by myself reveals surgical anatomy. I did place an intra-aortic balloon pump at that time. She noted catheterization by Dr. Rollene Fare December 2008 revealing patent grafts with normal LV function. Because of recent chest pain she had a stress test performed 08/05/15 that showed new anterolateral ischemia. Because of this she underwent outpatient cardiac catheterization by myself 09/16/15 revealed patent grafts with a nonviable bypassed small nondominant circumflex. Medical therapy was recommended.

## 2015-10-22 NOTE — Patient Instructions (Signed)
Medications:  Your physician recommends that you continue on your current medications as directed. Please refer to the Current Medication list given to you today.   Follow-Up:  Your physician wants you to follow-up in: 6 months with [facility] and 12 months with Dr. Gwenlyn Found. You will receive a reminder letter in the mail two months in advance. If you don't receive a letter, please call our office to schedule the follow-up appointment.  If you need a refill on your cardiac medications before your next appointment, please call your pharmacy.

## 2015-10-22 NOTE — Progress Notes (Signed)
10/22/2015 [place]   03/30/1940  [id number]  Primary Physician Kathlene November, MD Primary Cardiologist: Lorretta Harp MD Renae Gloss  HPI:  Ms. [name] is a 75 year old mildly overweight widowed Caucasian female mother of 2 living children (son at age 26 died of an overdose), grandmother of 5 grandchildren is a patient of Dr. Terance Ice. I last saw her in the office 09/07/15. Her cardiac risk factor profile is walkover treated diabetes, hypertension and hyperlipidemia. She has a strong family history of heart disease with a father who died of a myocardial infarction at age 66 and her mother at age 79. She had a heart attack in 1997 at which time I performed cardiac catheterization. Apparently I placed a balloon pump at that time and sent her to open heart surgery. Dr. Tharon Aquas Trigt performed coronary artery bypass grafting x5. She had her last catheterization performed by Dr. Rollene Fare December 2008 revealing patent grafts with normal LV function. She had a negative Myoview back in 2012. Since I saw her years ago she's had several episodes of chest pain most recent one this past Sunday which was more intense and prolonged.She had a Myoview stress test performed 08/05/15 that showed new anterolateral ischemia. She's had several episodes of chest pain since I saw her back a month ago. She underwent outpatient cardiac catheterization 09/16/15 revealing patent grafts with an unbypassed nondominant circumflex are normal LV function. Medical therapy was recommended. She really has had no significant chest pain since her heart catheterization..   Current Outpatient Prescriptions  Medication Sig Dispense Refill  . aspirin EC 81 MG EC tablet Take 1 tablet (81 mg total) by mouth daily.    Marland Kitchen atenolol (TENORMIN) 50 MG tablet Take 1 tablet (50 mg total) by mouth daily. 90 tablet 2  . atorvastatin (LIPITOR) 20 MG tablet TAKE 1 TABLET(20 MG) BY MOUTH DAILY 30 tablet 9  . azelastine  (ASTELIN) 0.1 % nasal spray Place 2 sprays into both nostrils at bedtime as needed for rhinitis. Use in each nostril as directed 30 mL 3  . Blood Glucose Monitoring Suppl (ONE TOUCH ULTRA SYSTEM KIT) W/DEVICE KIT 1 kit by Does not apply route once. 1 each 0  . cholecalciferol (VITAMIN D) 1000 UNITS tablet Take 1,000 Units by mouth daily.      . clopidogrel (PLAVIX) 75 MG tablet Take 1 tablet (75 mg total) by mouth daily. 90 tablet 1  . clorazepate (TRANXENE) 7.5 MG tablet Take 1 tablet (7.5 mg total) by mouth as needed. (Patient taking differently: Take 7.5 mg by mouth daily as needed for anxiety. ) 30 tablet 5  . docusate sodium (COLACE) 100 MG capsule Take 200 mg by mouth daily as needed for mild constipation.     . fluticasone (FLONASE) 50 MCG/ACT nasal spray Place 2 sprays into both nostrils daily. (Patient taking differently: Place 2 sprays into both nostrils daily as needed for allergies. ) 16 g 3  . glucose blood (ONE TOUCH ULTRA TEST) test strip USE TO CHECK BLOOD SUGARS TWICE DAILY AS DIRECTED 100 each 3  . HYDROcodone-acetaminophen (NORCO/VICODIN) 5-325 MG tablet Take 1 tablet by mouth 3 (three) times daily as needed. (Patient taking differently: Take 1 tablet by mouth 3 (three) times daily as needed for moderate pain. ) 90 tablet 0  . insulin NPH Human (HUMULIN N,NOVOLIN N) 100 UNIT/ML injection 20-38 Units 2 (two) times daily before a meal. Inject 38 units into the skin every morning and inject  20 units into the skin at bedtime.    . insulin regular (NOVOLIN R,HUMULIN R) 100 units/mL injection Inject 18 Units into the skin 2 (two) times daily before a meal.     . irbesartan (AVAPRO) 300 MG tablet TAKE 1 TABLET BY MOUTH DAILY 90 tablet 3  . isosorbide mononitrate (IMDUR) 30 MG 24 hr tablet Take 0.5 tablets (15 mg total) by mouth daily. 16 tablet 6  . Liraglutide (VICTOZA) 18 MG/3ML SOPN Inject 0.2 mLs (1.2 mg total) into the skin at bedtime. Inject 1.2 mg daily    . metFORMIN (GLUCOPHAGE)  500 MG tablet TAKE 1 TABLET BY MOUTH EVERY MORNING AND 1 TABLETS EVERY EVENING 270 tablet 0  . Multiple Vitamin (MULTIVITAMIN) capsule Take 1 capsule by mouth daily.      Marland Kitchen neomycin-polymyxin-hydrocortisone (CORTISPORIN) otic solution 2-3 drops in each ear three times daily (Patient taking differently: Place 3 drops into both ears 3 (three) times daily as needed (Ear Pain). 2-3 drops in each ear three times daily) 10 mL 2  . niacin (NIASPAN) 500 MG CR tablet Take 1 tablet (500 mg total) by mouth every evening.    Marland Kitchen omeprazole (PRILOSEC) 40 MG capsule Take 1 capsule (40 mg total) by mouth daily. 90 capsule 3  . traMADol (ULTRAM) 50 MG tablet Take 1 tablet (50 mg total) by mouth every 6 (six) hours as needed for moderate pain or severe pain. 25 tablet 0   No current facility-administered medications for this visit.     Allergies  Allergen Reactions  . Codeine     REACTION: makes her nervous, orTylenol #3  . Ibuprofen     REACTION: nervous  . Meperidine Hcl     REACTION: nasuea and vomitting  . Naproxen Sodium     REACTION: nervous    Social History   Social History  . Marital status: Widowed    Spouse name: N/A  . Number of children: 2  . Years of education: N/A   Occupational History  . retired-- [employer]    .  Retired   Social History Main Topics  . Smoking status: Never Smoker  . Smokeless tobacco: Never Used  . Alcohol use No  . Drug use: No  . Sexual activity: No   Other Topics Concern  . Not on file   Social History Narrative   Lives by herself, lost husband ~ 2004      Review of Systems: General: negative for chills, fever, night sweats or weight changes.  Cardiovascular: negative for chest pain, dyspnea on exertion, edema, orthopnea, palpitations, paroxysmal nocturnal dyspnea or shortness of breath Dermatological: negative for rash Respiratory: negative for cough or wheezing Urologic: negative for hematuria Abdominal: negative for nausea, vomiting,  diarrhea, bright red blood per rectum, melena, or hematemesis Neurologic: negative for visual changes, syncope, or dizziness All other systems reviewed and are otherwise negative except as noted above.    Blood pressure 134/76, pulse 70, height 5' 6"  (1.676 m), weight 175 lb 3.2 oz (79.5 kg).  General appearance: alert and no distress Neck: no adenopathy, no carotid bruit, no JVD, supple, symmetrical, trachea midline and thyroid not enlarged, symmetric, no tenderness/mass/nodules Lungs: clear to auscultation bilaterally Heart: regular rate and rhythm, S1, S2 normal, no murmur, click, rub or gallop Extremities: extremities normal, atraumatic, no cyanosis or edema  EKG not performed today  ASSESSMENT AND PLAN:   Hyperlipidemia History of hyperlipidemia on statin therapy with recent lipid profile performed 07/27/15 revealed a total cholesterol 120, LDL  45 and HDL 48.  Essential hypertension History of hypertension blood pressure measured at 134/76. She is on atenolol and Avapro. She terminates at current dosing  Hx of CABG History of coronary artery disease status post bypass grafting in 1997 by Dr. Prescott Gum after catheterization performed by myself reveals surgical anatomy. I did place an intra-aortic balloon pump at that time. She noted catheterization by Dr. Rollene Fare December 2008 revealing patent grafts with normal LV function. Because of recent chest pain she had a stress test performed 08/05/15 that showed new anterolateral ischemia. Because of this she underwent outpatient cardiac catheterization by myself 09/16/15 revealed patent grafts with a nonviable bypassed small nondominant circumflex. Medical therapy was recommended.      Lorretta Harp MD FACP,FACC,FAHA, [facility] 10/22/2015 11:24 AM

## 2015-10-22 NOTE — Assessment & Plan Note (Signed)
History of hyperlipidemia on statin therapy with recent lipid profile performed 07/27/15 revealed a total cholesterol 120, LDL 45 and HDL 48.

## 2015-10-26 ENCOUNTER — Other Ambulatory Visit: Payer: Self-pay | Admitting: Endocrinology

## 2015-10-26 MED ORDER — AZELASTINE HCL 0.1 % NA SOLN: 2.0000 | Freq: Every evening | NASAL | 1 refills | Status: DC | PRN

## 2015-10-26 NOTE — Addendum Note (Signed)
Addended byDamita Dunnings D on: 10/26/2015 02:15 PM   Modules accepted: Orders

## 2015-10-26 NOTE — Telephone Encounter (Signed)
Spoke w/ Pt, informed her that [facility] unable to perform Dexa Scan. Informed her that [facility] center not accepting postmenopausal Dx code. Informed to keep [facility] appt but that we will change dexa scan order to [facility] [place] agreed to change order to [facility].

## 2015-10-28 ENCOUNTER — Other Ambulatory Visit: Payer: Self-pay | Admitting: Endocrinology

## 2015-11-02 DIAGNOSIS — E113291 Type 2 diabetes mellitus with mild nonproliferative diabetic retinopathy without macular edema, right eye: Secondary | ICD-10-CM | POA: Diagnosis not present

## 2015-11-02 DIAGNOSIS — E113212 Type 2 diabetes mellitus with mild nonproliferative diabetic retinopathy with macular edema, left eye: Secondary | ICD-10-CM | POA: Diagnosis not present

## 2015-11-02 DIAGNOSIS — H43813 Vitreous degeneration, bilateral: Secondary | ICD-10-CM | POA: Diagnosis not present

## 2015-11-02 DIAGNOSIS — H35432 Paving stone degeneration of retina, left eye: Secondary | ICD-10-CM | POA: Diagnosis not present

## 2015-11-04 ENCOUNTER — Other Ambulatory Visit: Payer: Self-pay | Admitting: Endocrinology

## 2015-11-09 DIAGNOSIS — E113212 Type 2 diabetes mellitus with mild nonproliferative diabetic retinopathy with macular edema, left eye: Secondary | ICD-10-CM | POA: Diagnosis not present

## 2015-11-12 ENCOUNTER — Ambulatory Visit
Admission: RE | Admit: 2015-11-12 | Discharge: 2015-11-12 | Disposition: A | Discharge status: Home / Self Care | Payer: Medicare Other | Source: Ambulatory Visit | Attending: Internal Medicine | Admitting: Internal Medicine

## 2015-11-12 ENCOUNTER — Other Ambulatory Visit: Payer: Medicare Other

## 2015-11-12 DIAGNOSIS — Z1231 Encounter for screening mammogram for malignant neoplasm of breast: Secondary | ICD-10-CM | POA: Diagnosis not present

## 2015-11-18 ENCOUNTER — Other Ambulatory Visit: Payer: Self-pay | Admitting: Internal Medicine

## 2015-11-23 ENCOUNTER — Other Ambulatory Visit (INDEPENDENT_AMBULATORY_CARE_PROVIDER_SITE_OTHER): Payer: Medicare Other

## 2015-11-23 DIAGNOSIS — Z794 Long term (current) use of insulin: Secondary | ICD-10-CM | POA: Diagnosis not present

## 2015-11-23 DIAGNOSIS — E1165 Type 2 diabetes mellitus with hyperglycemia: Secondary | ICD-10-CM | POA: Diagnosis not present

## 2015-11-23 LAB — HEMOGLOBIN A1C: Hgb A1c MFr Bld: 6.2 % (ref 4.6–6.5)

## 2015-11-23 LAB — BASIC METABOLIC PANEL
BUN: 15 mg/dL (ref 6–23)
CO2: 28 mEq/L (ref 19–32)
Calcium: 9.5 mg/dL (ref 8.4–10.5)
Chloride: 109 mEq/L (ref 96–112)
Creatinine, Ser: 1.3 mg/dL — ABNORMAL HIGH (ref 0.40–1.20)
GFR: 42.3 mL/min — AB (ref >60.00)
Glucose, Bld: 94 mg/dL (ref 70–99)
POTASSIUM: 3.9 meq/L (ref 3.5–5.1)
SODIUM: 145 meq/L (ref 135–145)

## 2015-11-29 ENCOUNTER — Encounter: Payer: Self-pay | Admitting: Endocrinology

## 2015-11-29 ENCOUNTER — Ambulatory Visit (INDEPENDENT_AMBULATORY_CARE_PROVIDER_SITE_OTHER): Payer: Medicare Other | Admitting: Endocrinology

## 2015-11-29 VITALS — BP 140/84 | HR 76 | Ht 66.0 in | Wt 175.0 lb

## 2015-11-29 DIAGNOSIS — E1165 Type 2 diabetes mellitus with hyperglycemia: Secondary | ICD-10-CM

## 2015-11-29 DIAGNOSIS — N289 Disorder of kidney and ureter, unspecified: Secondary | ICD-10-CM

## 2015-11-29 DIAGNOSIS — Z794 Long term (current) use of insulin: Secondary | ICD-10-CM

## 2015-11-29 DIAGNOSIS — I2510 Atherosclerotic heart disease of native coronary artery without angina pectoris: Secondary | ICD-10-CM

## 2015-11-29 DIAGNOSIS — I10 Essential (primary) hypertension: Secondary | ICD-10-CM

## 2015-11-29 NOTE — Progress Notes (Signed)
Patient ID: [name], female   DOB: 1940/04/16, 75 y.o.   MRN: [id number]            Reason for Appointment: Followup for Type 2 Diabetes  Referring physician: Larose Kells  History of Present Illness:          Diagnosis: Type 2 diabetes mellitus, date of diagnosis: 1998       Past history:   [name] was treated with metformin at diagnosis when this had been continued until 2015 Subsequently Amaryl was also added several years ago and this has been continued [name] blood sugars were under fair control between 2010 and early 2013 with A1c ranging from 7.4-9.4, mostly under 8% However since 08/2011 [name] A1c has been mostly over 8%  Insulin was added in 2014 with small doses of Lantus and this has been progressively increased [name] was started on mealtime insulin on [name] initial consultation in 9/15 because of high postprandial readings, sometimes over 300 With adding Victoza in 02/2014 [name] blood sugars were somewhat better with A1c coming down below 8%  Recent history:   INSULIN regimen: Regular 18 units before meals.  NPH 38 units in the morning and 20 hs  Non-insulin hypoglycemic drugs the patient is taking are:  Metformin 500 a.m., 1000 mg p.m. and Victoza 1.2 mg daily  A1c has been consistently under 7 It is now 6.2, improved  Current blood sugar patterns, daily management and problems identified:  Fasting blood sugars: These are relatively stable although does have occasional relatively high readings On an average blood sugars are probably higher in the morning recently, usually checking around 10-11 AM  Postprandial readings:  [name] does some readings in the afternoon but these are more before [name] late lunch and difficult to know which readings are after [name] afternoon meal Blood sugars are quite variable before suppertime around 6-8 PM [name] does take [name] insulin at lunchtime if [name] is eating a meal but usually does not  [name] has been variably active and mostly with yardwork        HYPOGLYCEMIA: [name] had an episode of feeling weak and shaky at church once, generally is getting up earlier in the morning on Sundays Also has a low sugar of 62 documented at about 4 PM from being late at lunch May sometimes carry candy with [name] but no other treatments for potential hypoglycemia  Side effects from medications have been: None Compliance with the medical regimen: good, [name] is taking [name] insulin consistently before meals   Glucose monitoring:  done 1-2 times a day         Glucometer: One Touch.      Blood Glucose readings by time of day from download:  Mean values apply above for all meters except median for One Touch  PRE-MEAL Fasting Lunch Dinner Bedtime Overall  Glucose range: 98-254  62-165  76-323  10 3-212    Mean/median: 155   125   143   POST-MEAL PC Breakfast PC Lunch PC Dinner  Glucose range:  116-219    Mean/median:        Self-care: The diet that the patient has been following is: none, usually eating low fat meals Meals: 2-3 meals per day at irregular times. Dinner 6-7 pm Breakfast is a cereal but sometimes cheese toast or oatmeal at 10 am, usually eating sandwiches for meals, snacks will be with crackers         Dietician visit, most recent: never.  [name] saw the CDE in  02/2014              Exercise: none recently, Planning to do some leaf cleanup this week.   Weight history: 150-170 previously  Wt Readings from Last 3 Encounters:  11/29/15 175 lb (79.4 kg)  10/22/15 175 lb 3.2 oz (79.5 kg)  09/28/15 172 lb 9.6 oz (78.3 kg)    Glycemic control:   Lab Results  Component Value Date   HGBA1C 6.2 11/23/2015   HGBA1C 7.0 (H) 05/18/2015   HGBA1C 6.5 12/17/2014   Lab Results  Component Value Date   MICROALBUR 2.8 (H) 11/16/2014   LDLCALC 45 07/27/2015   CREATININE 1.30 (H) 11/23/2015     OTHER active problems  discussed in review of systems     Medication List       Accurate as of 11/29/15  9:40 AM. Always use your most recent med list.           aspirin 81 MG EC tablet Take 1 tablet (81 mg total) by mouth daily.   atenolol 50 MG tablet Commonly known as:  TENORMIN Take 1 tablet (50 mg total) by mouth daily.   atorvastatin 20 MG tablet Commonly known as:  LIPITOR TAKE 1 TABLET(20 MG) BY MOUTH DAILY   azelastine 0.1 % nasal spray Commonly known as:  ASTELIN Place 2 sprays into both nostrils at bedtime as needed for rhinitis. Use in each nostril as directed   cholecalciferol 1000 units tablet Commonly known as:  VITAMIN D Take 1,000 Units by mouth daily.   clopidogrel 75 MG tablet Commonly known as:  PLAVIX Take 1 tablet (75 mg total) by mouth daily.   clorazepate 7.5 MG tablet Commonly known as:  TRANXENE Take 1 tablet (7.5 mg total) by mouth as needed.   docusate sodium 100 MG capsule Commonly known as:  COLACE Take 200 mg by mouth daily as needed for mild constipation.   fluticasone 50 MCG/ACT nasal spray Commonly known as:  FLONASE Place 2 sprays into both nostrils daily as needed for allergies or rhinitis.   glucose blood test strip Commonly known as:  ONE TOUCH ULTRA TEST USE TO CHECK BLOOD SUGARS TWICE DAILY AS DIRECTED   HYDROcodone-acetaminophen 5-325 MG tablet Commonly known as:  NORCO/VICODIN Take 1 tablet by mouth 3 (three) times daily as needed.   insulin NPH Human 100 UNIT/ML injection Commonly known as:  HUMULIN N,NOVOLIN N 20-38 Units 2 (two) times daily before a meal. Inject 38 units into the skin every morning and inject 20 units into the skin at bedtime.   insulin regular 250 units/2.32m (100 units/mL) injection Commonly known as:  NOVOLIN R,HUMULIN R Inject 18 Units into the skin 2 (two) times daily before a meal.   INSULIN SYRINGE .5CC/31GX5/16" 31G X 5/16" 0.5 ML Misc USE TO INJECT INSULIN 5 TIMES PER DAY   irbesartan 300 MG tablet Commonly known as:  AVAPRO TAKE 1 TABLET BY MOUTH DAILY   isosorbide mononitrate 30 MG 24 hr tablet Commonly known as:  IMDUR Take 0.5  tablets (15 mg total) by mouth daily.   liraglutide 18 MG/3ML Sopn Commonly known as:  VICTOZA Inject 0.2 mLs (1.2 mg total) into the skin at bedtime. Inject 1.2 mg daily   metFORMIN 500 MG tablet Commonly known as:  GLUCOPHAGE TAKE 1 TABLET BY MOUTH EVERY MORNING AND 1 TABLETS EVERY EVENING   multivitamin capsule Take 1 capsule by mouth daily.   neomycin-polymyxin-hydrocortisone otic solution Commonly known as:  CORTISPORIN 2-3 drops in each  ear three times daily   niacin 500 MG CR tablet Commonly known as:  NIASPAN Take 1 tablet (500 mg total) by mouth every evening.   omeprazole 40 MG capsule Commonly known as:  PRILOSEC Take 1 capsule (40 mg total) by mouth daily.   ONE TOUCH ULTRA SYSTEM KIT w/Device Kit 1 kit by Does not apply route once.   traMADol 50 MG tablet Commonly known as:  ULTRAM Take 1 tablet (50 mg total) by mouth every 6 (six) hours as needed for moderate pain or severe pain.       Allergies:  Allergies  Allergen Reactions  . Codeine     REACTION: makes [name] nervous, orTylenol #3  . Ibuprofen     REACTION: nervous  . Meperidine Hcl     REACTION: nasuea and vomitting  . Naproxen Sodium     REACTION: nervous    Past Medical History:  Diagnosis Date  . Acute blood loss anemia 09/17/2015  . Anemia   . Anginal pain ([place]) 2017  . Arthritis    "hands and knees mostly" (09/16/2015)  . B12 deficiency anemia   . CAD (coronary artery disease)    Dr Gwenlyn Found  . 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 Depression   . DJD (degenerative joint disease)   . 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 Gastritis   . GERD (gastroesophageal reflux disease)   . Headache(784.0)   . History of blood transfusion    "I"ve had 20 some; thru birth of children, loss of blood, last 2 were in ~ 1999 before my cancer surgery" (09/16/2015)  . History of hiatal hernia   . History of shingles   . Hx of colonic polyps   . Hyperlipidemia   .  Hypertension   . 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 Pneumonia    X 1  . Postoperative hematoma involving circulatory system following cardiac catheterization 09/17/2015  . S/P cardiac cath 09/16/15 09/17/2015  . Type II diabetes mellitus ([place])   . 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[facility])    as a child  . Venous insufficiency     Past Surgical History:  Procedure Laterality Date  . CARDIAC CATHETERIZATION  1997; 2008; 09/16/2015  . CARDIAC CATHETERIZATION N/A 09/16/2015   Procedure: Right/Left Heart Cath and Coronary/Graft Angiography;  Surgeon: Lorretta Harp, MD;  Location: [facility];  Service: Cardiovascular;  Laterality: N/A;  . CESAREAN SECTION  1966  . CORONARY ARTERY BYPASS GRAFT  1997   CABG X5  . FRACTURE SURGERY    . HERNIA REPAIR    . LAPAROSCOPIC ASSISTED VENTRAL HERNIA REPAIR  09/2008    with incarcerated colon;  Dr. Lucia Gaskins  . PATELLA FRACTURE SURGERY Left 11/1994   "crushed my knee"; put in a plate & 6 screws"  . REFRACTIVE SURGERY Left 07/30/2015  . 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 March Rummage  . SHOULDER ARTHROSCOPY W/ ROTATOR CUFF REPAIR Right 1997  . TONSILLECTOMY  1960s  . TOTAL KNEE ARTHROPLASTY WITH HARDWARE REMOVAL Left 12/1994    (infex, hardware removed )  . TUBAL LIGATION  1966    Family History  Problem Relation Age of Onset  . Heart disease Mother 59  . Heart disease Father 20    MI  . Hypertension Child   . Heart attack Child   . Heart disease Maternal Aunt     x 2, all deceased  . Heart disease Maternal  Uncle     x 4, all deceased  . Emphysema Brother 44  . Colon cancer Neg Hx   . Breast cancer Neg Hx     Social History:  reports that [name] has never smoked. [name] has never used smokeless tobacco. [name] reports that [name] does not drink alcohol or use drugs.    Review of Systems         Lipids: Has had excellent control of hypercholesterolemia with long-term use of Crestor [name] has a history of  coronary bypass surgery.   Also has been  on Niaspan, Prescribed by PCP       Lab Results  Component Value Date   CHOL 128 07/27/2015   HDL 48 07/27/2015   LDLCALC 45 07/27/2015   LDLDIRECT 43.1 05/08/2012   TRIG 176 (H) 07/27/2015   CHOLHDL 2.7 07/27/2015                  The blood pressure has been  well controlled and [name] on 150 mg Avapro, and atenolol.  No hypokalemia  Lab Results  Component Value Date   CREATININE 1.30 (H) 11/23/2015   BUN 15 11/23/2015   NA 145 11/23/2015   K 3.9 11/23/2015   CL 109 11/23/2015   CO2 28 11/23/2015    Mild CKD: [name] creatinine Is Upper normal usually This is stable now    Lab Results  Component Value Date   CREATININE 1.30 (H) 11/23/2015   CREATININE 1.32 (H) 09/21/2015   CREATININE 1.31 (H) 09/17/2015       No history of Numbness, tingling or burning in feet   Diabetic foot exam in 9/15 showed:  Decreased monofilament in toes and plantar surfaces, skin exam is normal. Pedal pulses: absent left DP   Physical Examination:  BP 140/84   Pulse 76   Ht _0  (1.676 m)   Wt 175 lb (79.4 kg)   SpO2 98%   BMI 28.25 kg/m   No edema present  Diabetes type 2, uncontrolled with BMI 27   See history of present illness for detailed discussion of [name] current blood sugar patterns, management and problems identified  Although [name] A1c is excellent at 6.2 [name] blood sugars are fluctuating especially in the afternoon Overall control is adequate for [name] age and duration of diabetes  This is because of [name] variability and mealtimes, activity level and inconsistent once meal [name] has had a couple of relatively low sugars midday and afternoon and some of the readings before suppertime are as low as 76 Most likely the variability in blood sugars are related to [name] taking NPH and Regular Insulin [name] will not be able to afford newer analog insulin formulations Currently metformin doses 500 mg twice a day, reduced because of mild renal  dysfunction  RENAL insufficiency: [name] creatinine is relatively better, not taking HCTZ now However blood pressure is higher now  HYPERTENSION: Blood pressure is higher today, previously had been low normal  PLAN:   Reduce both NPH and regular by 2 units in the morning  Emphasized need to eat [name] lunch or mid afternoon snack on time to avoid hypoglycemia.  [name] needs to carry glucose tablets in case [name] gets hypoglycemic, discussed that [name] does have variability effects of [name] NPH and Regular Insulin from day to day  [name] will continue Victoza.  Discussed blood sugar targets at various times and needs to check more readings after supper  If [name] has more physical activity [name] will need to  take an extra snack before going out  Will need to continue following [name] renal function, does not have diabetic nephropathy Follow-up with PCP for management of hypertension, may need to add another medication   Patient Instructions  Check blood sugars on waking up 3-x per week   Also check blood sugars about 2-3 hours after a meal and do this after different meals by rotation  Recommended blood sugar levels on waking up is 90-130 and about 2 hours after meal is 130-160  Please bring your blood sugar monitor to each visit, thank you  Reduce am N to 36 ams R to 16  Use 2-3 glucose tabs when sugar low    Counseling time on subjects discussed above is over 50% of today's 25 minute visit  Giannamarie Paulus 11/29/2015, 9:40 AM   Note: This office note was prepared with Estate agent. Any transcriptional errors that result from this process are unintentional.

## 2015-11-29 NOTE — Patient Instructions (Addendum)
Check blood sugars on waking up 3-x per week   Also check blood sugars about 2-3 hours after a meal and do this after different meals by rotation  Recommended blood sugar levels on waking up is 90-130 and about 2 hours after meal is 130-160  Please bring your blood sugar monitor to each visit, thank you  Reduce am N to 36 ams R to 16  Use 2-3 glucose tabs when sugar low

## 2015-11-30 ENCOUNTER — Ambulatory Visit (INDEPENDENT_AMBULATORY_CARE_PROVIDER_SITE_OTHER): Payer: Medicare Other | Admitting: Internal Medicine

## 2015-11-30 ENCOUNTER — Encounter: Payer: Self-pay | Admitting: Internal Medicine

## 2015-11-30 VITALS — BP 128/76 | HR 76 | Temp 97.5°F | Resp 14 | Ht 66.0 in | Wt 173.0 lb

## 2015-11-30 DIAGNOSIS — I2510 Atherosclerotic heart disease of native coronary artery without angina pectoris: Secondary | ICD-10-CM

## 2015-11-30 DIAGNOSIS — Z780 Asymptomatic menopausal state: Secondary | ICD-10-CM

## 2015-11-30 DIAGNOSIS — M159 Polyosteoarthritis, unspecified: Secondary | ICD-10-CM

## 2015-11-30 DIAGNOSIS — M8580 Other specified disorders of bone density and structure, unspecified site: Secondary | ICD-10-CM

## 2015-11-30 DIAGNOSIS — M150 Primary generalized (osteo)arthritis: Secondary | ICD-10-CM

## 2015-11-30 DIAGNOSIS — I25810 Atherosclerosis of coronary artery bypass graft(s) without angina pectoris: Secondary | ICD-10-CM

## 2015-11-30 DIAGNOSIS — Z23 Encounter for immunization: Secondary | ICD-10-CM | POA: Diagnosis not present

## 2015-11-30 DIAGNOSIS — D62 Acute posthemorrhagic anemia: Secondary | ICD-10-CM | POA: Diagnosis not present

## 2015-11-30 DIAGNOSIS — I10 Essential (primary) hypertension: Secondary | ICD-10-CM

## 2015-11-30 MED ORDER — HYDROCODONE-ACETAMINOPHEN 5-325 MG PO TABS: 1.0000 | ORAL_TABLET | Freq: Three times a day (TID) | ORAL | 0 refills | Status: DC | PRN

## 2015-11-30 NOTE — Patient Instructions (Addendum)
GO TO THE LAB : Get the blood work  . Also provide a urine sample for a UDS   GO TO THE FRONT DESK Schedule your next appointment for a  Check up in 4-5 months     Increase Tenormin 50 mg to 1.5 tablets a day   Check the  blood pressure daily. Call with readings in 2 weeks. Call anytime if side effects. Be sure your blood pressure is between 110/65 and  130/85.

## 2015-11-30 NOTE — Progress Notes (Signed)
Subjective:    Patient ID: [place], female    DOB: [birth year], 75 y.o.   MRN: [id number]  DOS:  11/30/2015 Type of visit - description :  Interval history: Since the last office visit, she had a cardiac catheterization 09/16/2015, medical therapy was indicated. Procedure was complicated by a hematoma, anemia and low blood pressure. Saw endocrinology, medications were adjusted. HTN: Check BP is consistently, BPs for this month: 147/70, 162/76, 140/84. Pulse in the 70s.  Review of Systems In general feeling well, she did report that since the summer is getting more tired and having more difficulty breathing with exertion. Denies exertional chest pain, no cough. Sometimes wheezes>> at rest, usually when seated in a chair not  when she lays down in bed. No lower extremity edema, palpitations, paroxysmal nocturnal dyspnea. No orthopnea.  Past Medical History:  Diagnosis Date  . Acute blood loss anemia 09/17/2015  . Anemia   . Anginal pain ([place]) 2017  . Arthritis    "hands and knees mostly" (09/16/2015)  . B12 deficiency anemia   . CAD (coronary artery disease)    Dr Gwenlyn Found  . CLL (chronic lymphocytic leukemia) ([place]) 02/25/2014  . Depression   . DJD (degenerative joint disease)   . 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 Gastritis   . GERD (gastroesophageal reflux disease)   . Headache(784.0)   . History of blood transfusion    "I"ve had 20 some; thru birth of children, loss of blood, last 2 were in ~ 1999 before my cancer surgery" (09/16/2015)  . History of hiatal hernia   . History of shingles   . Hx of colonic polyps   . Hyperlipidemia   . Hypertension   . 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 Pneumonia    X 1  . Postoperative hematoma involving circulatory system following cardiac catheterization 09/17/2015  . S/P cardiac cath 09/16/15 09/17/2015  . Type II diabetes mellitus ([place])   . 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 Venous insufficiency     Past Surgical History:  Procedure Laterality Date  . CARDIAC CATHETERIZATION  1997; 2008; 09/16/2015  . CARDIAC CATHETERIZATION N/A 09/16/2015   Procedure: Right/Left Heart Cath and Coronary/Graft Angiography;  Surgeon: Lorretta Harp, MD;  Location: [facility];  Service: Cardiovascular;  Laterality: N/A;  . CESAREAN SECTION  1966  . CORONARY ARTERY BYPASS GRAFT  1997   CABG X5  . FRACTURE SURGERY    . HERNIA REPAIR    . LAPAROSCOPIC ASSISTED VENTRAL HERNIA REPAIR  09/2008    with incarcerated colon;  Dr. Lucia Gaskins  . PATELLA FRACTURE SURGERY Left 11/1994   "crushed my knee"; put in a plate & 6 screws"  . REFRACTIVE SURGERY Left 07/30/2015  . 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 March Rummage  . SHOULDER ARTHROSCOPY W/ ROTATOR CUFF REPAIR Right 1997  . TONSILLECTOMY  1960s  . TOTAL KNEE ARTHROPLASTY WITH HARDWARE REMOVAL Left 12/1994    (infex, hardware removed )  . TUBAL LIGATION  1966    Social History   Social History  . Marital status: Widowed    Spouse name: N/A  . Number of children: 2  . Years of education: N/A   Occupational History  . retired-- [employer]    .  Retired   Social History Main Topics  . Smoking status: Never Smoker  . Smokeless tobacco: Never Used  . Alcohol  use No  . Drug use: No  . Sexual activity: No   Other Topics Concern  . Not on file   Social History Narrative   Lives by herself, lost husband ~ 2004         Medication List       Accurate as of 11/30/15 11:59 PM. Always use your most recent med list.          aspirin 81 MG EC tablet Take 1 tablet (81 mg total) by mouth daily.   atenolol 50 MG tablet Commonly known as:  TENORMIN Take 75 mg by mouth daily.   atorvastatin 20 MG tablet Commonly known as:  LIPITOR TAKE 1 TABLET(20 MG) BY MOUTH DAILY   azelastine 0.1 % nasal spray Commonly known as:  ASTELIN Place 2 sprays into both nostrils at bedtime as needed for  rhinitis. Use in each nostril as directed   cholecalciferol 1000 units tablet Commonly known as:  VITAMIN D Take 1,000 Units by mouth daily.   clopidogrel 75 MG tablet Commonly known as:  PLAVIX Take 1 tablet (75 mg total) by mouth daily.   clorazepate 7.5 MG tablet Commonly known as:  TRANXENE Take 1 tablet (7.5 mg total) by mouth as needed.   docusate sodium 100 MG capsule Commonly known as:  COLACE Take 200 mg by mouth daily as needed for mild constipation.   fluticasone 50 MCG/ACT nasal spray Commonly known as:  FLONASE Place 2 sprays into both nostrils daily as needed for allergies or rhinitis.   glucose blood test strip Commonly known as:  ONE TOUCH ULTRA TEST USE TO CHECK BLOOD SUGARS TWICE DAILY AS DIRECTED   HYDROcodone-acetaminophen 5-325 MG tablet Commonly known as:  NORCO/VICODIN Take 1 tablet by mouth 3 (three) times daily as needed for moderate pain.   insulin NPH Human 100 UNIT/ML injection Commonly known as:  HUMULIN N,NOVOLIN N 20-36 Units 2 (two) times daily before a meal. Inject 36 units into the skin every morning and inject 20 units into the skin at bedtime.   insulin regular 250 units/2.36m (100 units/mL) injection Commonly known as:  NOVOLIN R,HUMULIN R Inject 16 Units into the skin 2 (two) times daily before a meal.   INSULIN SYRINGE .5CC/31GX5/16" 31G X 5/16" 0.5 ML Misc USE TO INJECT INSULIN 5 TIMES PER DAY   irbesartan 300 MG tablet Commonly known as:  AVAPRO TAKE 1 TABLET BY MOUTH DAILY   isosorbide mononitrate 30 MG 24 hr tablet Commonly known as:  IMDUR Take 0.5 tablets (15 mg total) by mouth daily.   liraglutide 18 MG/3ML Sopn Commonly known as:  VICTOZA Inject 0.2 mLs (1.2 mg total) into the skin at bedtime. Inject 1.2 mg daily   metFORMIN 500 MG tablet Commonly known as:  GLUCOPHAGE TAKE 1 TABLET BY MOUTH EVERY MORNING AND 1 TABLETS EVERY EVENING   multivitamin capsule Take 1 capsule by mouth daily.     neomycin-polymyxin-hydrocortisone otic solution Commonly known as:  CORTISPORIN 2-3 drops in each ear three times daily   niacin 500 MG CR tablet Commonly known as:  NIASPAN Take 1 tablet (500 mg total) by mouth every evening.   omeprazole 40 MG capsule Commonly known as:  PRILOSEC Take 1 capsule (40 mg total) by mouth daily.   ONE TOUCH ULTRA SYSTEM KIT w/Device Kit 1 kit by Does not apply route once.          Objective:   Physical Exam BP 128/76 (BP Location: Left Arm, Patient Position: Sitting, Cuff  Size: Normal)   Pulse 76   Temp 97.5 F (36.4 C) (Oral)   Resp 14   Ht 5' 6"  (1.676 m)   Wt 173 lb (78.5 kg)   SpO2 96%   BMI 27.92 kg/m  General:   Well developed, well nourished . NAD.  HEENT:  Normocephalic . Face symmetric, atraumatic Lungs:  CTA B Normal respiratory effort, no intercostal retractions, no accessory muscle use. Heart: RRR,  no murmur.  No pretibial edema bilaterally  Skin: Not pale. Not jaundice Neurologic:  alert & oriented X3.  Speech normal, gait appropriate for age and unassisted Psych--  Cognition and judgment appear intact.  Cooperative with normal attention span and concentration.  Behavior appropriate. No anxious or depressed appearing.      Assessment & Plan:    Assessment> DM  Dr Dwyane Dee  +retinopathy  HTN Hyperlipidemia CRI creat ~1.4 Depression/anxiety: tranxene qhs prn (takes rarely) DJD -- hydrocodone (rx pcp) Chronic lymphocytic leukemia-2016, Dr Marin Olp H/o anemia  B12 def CAD, Dr Gwenlyn Found MI 89 >> CABG, cath 12-13-2006, myoview 2012 no ischemic CP: Stress test 08/05/2015, indeterminate risk study, + lateral ischemia: Cardiac catheterization 09/16/2015: Rx medical Venous insuff GI:  Dr Silverio Decamp ---GERD ---IBS ---Gastritis ---H/o ulcerative colitis as a child H/o shingles    PLAN DM: per Endocrinology, sees the eye doctor regularly. HTN: on  Imdur, Avapro, Tenormin 50 mg daily. Last BMP satisfactory.  Ambulatory BP readings in the high 140s and sometimes 160s. Heart rate in the 70s. Room for improvement. Increase Tenormin to 75 mg, check BP is. See instructions. DJD: Well control with hydrocodone, usually takes one tablet a day. Contract and a UDS today CAD: Recent cardiac cath 09/16/2015 reviewed, RX medical therapy. She reports some DOE, since the summer. Last chest x-rays wnl, never smoker. No edema. She did have some anemia after the cardiac catheterization. Recheck CBC, for now recommend observation for DOE; ? deconditioning.  Flu shot today  DEXA reordered today RTC 4-5 months   Today, I spent more than  26  min with the patient: >50% of the time counseling regards dyspnea on exertion,  recommend observation after a thorough review of the chart. Multiple questions answered to the best of my ability.

## 2015-11-30 NOTE — Progress Notes (Signed)
Pre visit review using our clinic review tool, if applicable. No additional management support is needed unless otherwise documented below in the visit note. 

## 2015-12-01 ENCOUNTER — Telehealth: Payer: Self-pay | Admitting: *Deleted

## 2015-12-01 LAB — CBC WITH DIFFERENTIAL/PLATELET
BASOS ABS: 0 10*3/uL (ref 0.0–0.1)
BASOS PCT: 0.1 % (ref 0.0–3.0)
EOS ABS: 0.2 10*3/uL (ref 0.0–0.7)
Eosinophils Relative: 1.1 % (ref 0.0–5.0)
HCT: 34.9 % — ABNORMAL LOW (ref 36.0–46.0)
HEMOGLOBIN: 11.5 g/dL — AB (ref 12.0–15.0)
Lymphocytes Relative: 67 % — ABNORMAL HIGH (ref 12.0–46.0)
Lymphs Abs: 12.4 10*3/uL — ABNORMAL HIGH (ref 0.7–4.0)
MCHC: 33 g/dL (ref 30.0–36.0)
MCV: 92.2 fl (ref 78.0–100.0)
MONO ABS: 0.9 10*3/uL (ref 0.1–1.0)
Monocytes Relative: 5 % (ref 3.0–12.0)
Neutro Abs: 4.9 10*3/uL (ref 1.4–7.7)
Neutrophils Relative %: 26.8 % — ABNORMAL LOW (ref 43.0–77.0)
Platelets: 197 10*3/uL (ref 150.0–400.0)
RBC: 3.79 Mil/uL — ABNORMAL LOW (ref 3.87–5.11)
RDW: 14.4 % (ref 11.5–15.5)

## 2015-12-01 NOTE — Telephone Encounter (Signed)
Is okay, history of CLL

## 2015-12-01 NOTE — Telephone Encounter (Signed)
FYI

## 2015-12-01 NOTE — Assessment & Plan Note (Signed)
DM: per Endocrinology, sees the eye doctor regularly. HTN: on  Imdur, Avapro, Tenormin 50 mg daily. Last BMP satisfactory. Ambulatory BP readings in the high 140s and sometimes 160s. Heart rate in the 70s. Room for improvement. Increase Tenormin to 75 mg, check BP is. See instructions. DJD: Well control with hydrocodone, usually takes one tablet a day. Contract and a UDS today CAD: Recent cardiac cath 09/16/2015 reviewed, RX medical therapy. She reports some DOE, since the summer. Last chest x-rays wnl, never smoker. No edema. She did have some anemia after the cardiac catheterization. Recheck CBC, for now recommend observation for DOE; ? deconditioning.  Flu shot today  DEXA reordered today RTC 4-5 months

## 2015-12-01 NOTE — Telephone Encounter (Signed)
[facility] reporting critical 18.4 white count

## 2015-12-02 DIAGNOSIS — E113212 Type 2 diabetes mellitus with mild nonproliferative diabetic retinopathy with macular edema, left eye: Secondary | ICD-10-CM | POA: Diagnosis not present

## 2015-12-02 DIAGNOSIS — Z79899 Other long term (current) drug therapy: Secondary | ICD-10-CM | POA: Diagnosis not present

## 2015-12-02 DIAGNOSIS — E113291 Type 2 diabetes mellitus with mild nonproliferative diabetic retinopathy without macular edema, right eye: Secondary | ICD-10-CM | POA: Diagnosis not present

## 2015-12-02 DIAGNOSIS — Z79891 Long term (current) use of opiate analgesic: Secondary | ICD-10-CM | POA: Diagnosis not present

## 2015-12-03 ENCOUNTER — Other Ambulatory Visit: Payer: Medicare Other

## 2015-12-09 ENCOUNTER — Telehealth: Payer: Self-pay

## 2015-12-09 NOTE — Telephone Encounter (Signed)
UDS: 12/02/2015  Negative for Hydrocodone: PRN   Low risk per Dr. Larose Kells 12/09/2015

## 2015-12-10 ENCOUNTER — Ambulatory Visit (INDEPENDENT_AMBULATORY_CARE_PROVIDER_SITE_OTHER)
Admission: RE | Admit: 2015-12-10 | Discharge: 2015-12-10 | Disposition: A | Discharge status: Home / Self Care | Payer: Medicare Other | Source: Ambulatory Visit | Attending: Internal Medicine | Admitting: Internal Medicine

## 2015-12-10 DIAGNOSIS — Z780 Asymptomatic menopausal state: Secondary | ICD-10-CM | POA: Diagnosis not present

## 2015-12-13 ENCOUNTER — Other Ambulatory Visit: Payer: Self-pay | Admitting: Endocrinology

## 2015-12-17 ENCOUNTER — Telehealth: Payer: Self-pay | Admitting: Internal Medicine

## 2015-12-17 ENCOUNTER — Other Ambulatory Visit: Payer: Self-pay | Admitting: Gastroenterology

## 2015-12-17 ENCOUNTER — Other Ambulatory Visit: Payer: Self-pay | Admitting: Internal Medicine

## 2015-12-17 MED ORDER — ATENOLOL 100 MG PO TABS: 100.0000 mg | ORAL_TABLET | Freq: Every day | ORAL | 1 refills | Status: DC

## 2015-12-17 NOTE — Telephone Encounter (Signed)
Recommend to increase atenolol to 100 mg daily, send a new prescription if needed. Call in 10 days with readings.

## 2015-12-17 NOTE — Telephone Encounter (Signed)
FYI

## 2015-12-17 NOTE — Telephone Encounter (Signed)
Caller name: Relationship to patient: Can be reached: Pharmacy:  Reason for call: Patient called to give BP readings to provider. States the lowest it has been since 12/01/2015 is 11/29: 130/62   Pulse Rate 79 12/12: 129/73   Pulse Rate 72  The highest it has been is: 12/8: 170/81   Pulse Rate 81  And 166/78   Pulse Rate 81 12/10: 165/77  Pulse Rate 71  Today it is 139/64  Pulse Rate 66  Patient states if you need to leave her a message you can leave it on her VM if she does not answer

## 2015-12-17 NOTE — Telephone Encounter (Signed)
LMOM informing Pt of recommendations. Rx sent to [facility]. Instructed Pt to call in 10 days w/ BP readings and if she has any questions/concerns.

## 2015-12-19 ENCOUNTER — Other Ambulatory Visit: Payer: Self-pay | Admitting: Endocrinology

## 2015-12-20 ENCOUNTER — Telehealth: Payer: Self-pay | Admitting: *Deleted

## 2015-12-20 NOTE — Telephone Encounter (Signed)
Scheduled appt for 01/07/16.

## 2016-01-06 NOTE — Progress Notes (Addendum)
Subjective:   [name] is a 76 y.o. female who presents for Medicare Annual (Subsequent) preventive examination.  Review of Systems:  No ROS.  Medicare Wellness Visit. Cardiac Risk Factors include: advanced age (>72mn, >>14women);diabetes mellitus;dyslipidemia;hypertension Sleep patterns: Falls asleep easy. Has trouble staying asleep. Feels rested most of the time. Home Safety/Smoke Alarms: Smoke and carbon monoxide detectors in place.  Living environment; residence and Firearm Safety: Lives alone in 1 story home. No guns. Feels safe. Seat Belt Safety/Bike Helmet: Wears seat belt.   Counseling:   Eye Exam- Wears glasses for reading. Follows with Dr.Sanders and Dr.Bevis. Dental- upper and lower dentures.Pt states she does gum care.  Female:   Pap- No longer getting pap screening per pt.      Mammo- Last 11/15/15:  BI-RADS CATEGORY  1: Negative.  Dexa scan- Last 12/13/15: Osteoporosis. Repeat 2 yrs per report.       CCS- Last exam 11/04/14: Polyps removed were precancerous. Repeat in 3 yrs per report.    Objective:     Vitals: BP (!) 152/78 (BP Location: Left Arm, Patient Position: Sitting, Cuff Size: Large)   Pulse 76   Ht 5' 6"  (1.676 m)   Wt 171 lb 6.4 oz (77.7 kg)   SpO2 98%   BMI 27.66 kg/m   Body mass index is 27.66 kg/m.   Tobacco History  Smoking Status  . Never Smoker  Smokeless Tobacco  . Never Used     Counseling given: No   Past Medical History:  Diagnosis Date  . Acute blood loss anemia 09/17/2015  . Anemia   . Anginal pain ([place] 2017  . Arthritis    "hands and knees mostly" (09/16/2015)  . B12 deficiency anemia   . CAD (coronary artery disease)    Dr BGwenlyn Found . CLL (ch[place]ocytic leukemia) ([place] 02/25/2014  . Depression   . DJD (degenerative joint disease)   . 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 Gastritis   . GERD (gastroesophageal reflux disease)   . Headache(784.0)   . History of blood transfusion    "I"ve had  20 some; thru birth of children, loss of blood, last 2 were in ~ 1999 before my cancer surgery" (09/16/2015)  . History of hiatal hernia   . History of shingles   . Hx of colonic polyps   . Hyperlipidemia   . Hypertension   . 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[place] 2000  . Pneumonia    X 1  . Postoperative hematoma involving circulatory system following cardiac catheterization 09/17/2015  . S/P cardiac cath 09/16/15 09/17/2015  . Type II diabetes mellitus ([place]   . 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 Venous insufficiency    Past Surgical History:  Procedure Laterality Date  . CARDIAC CATHETERIZATION  1997; 2008; 09/16/2015  . CARDIAC CATHETERIZATION N/A 09/16/2015   Procedure: Right/Left Heart Cath and Coronary/Graft Angiography;  Surgeon: JLorretta Harp MD;  [facility];  Service: Cardiovascular;  Laterality: N/A;  . CESAREAN SECTION  1966  . CORONARY ARTERY BYPASS GRAFT  1997   CABG X5  . EYE SURGERY     2 laser surgeries on left eye with implant  . FRACTURE SURGERY    . HERNIA REPAIR    . LAPAROSCOPIC ASSISTED VENTRAL HERNIA REPAIR  09/2008    with incarcerated colon;  Dr. NLucia Gaskins . PATELLA FRACTURE SURGERY Left 11/1994   "crushed my knee";  put in a plate & 6 screws"  . REFRACTIVE SURGERY Left 07/30/2015  . 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 March Rummage  . SHOULDER ARTHROSCOPY W/ ROTATOR CUFF REPAIR Right 1997  . TONSILLECTOMY  1960s  . TOTAL KNEE ARTHROPLASTY WITH HARDWARE REMOVAL Left 12/1994    (infex, hardware removed )  . TUBAL LIGATION  1966   Family History  Problem Relation Age of Onset  . Heart disease Mother 8  . Heart disease Father 47    MI  . Hypertension Child   . Heart attack Child   . Heart disease Maternal Aunt     x 2, all deceased  . Heart disease Maternal Uncle     x 4, all deceased  . Emphysema Brother 63  . Colon cancer Neg Hx   . Breast cancer Neg Hx    History  Sexual Activity  .  Sexual activity: No    Outpatient Encounter Prescriptions as of 01/07/2016  Medication Sig  . aspirin EC 81 MG EC tablet Take 1 tablet (81 mg total) by mouth daily.  Marland Kitchen atenolol (TENORMIN) 100 MG tablet Take 1 tablet (100 mg total) by mouth daily.  Marland Kitchen atorvastatin (LIPITOR) 20 MG tablet TAKE 1 TABLET(20 MG) BY MOUTH DAILY  . azelastine (ASTELIN) 0.1 % nasal spray Place 2 sprays into both nostrils at bedtime as needed for rhinitis. Use in each nostril as directed  . Blood Glucose Monitoring Suppl (ONE TOUCH ULTRA SYSTEM KIT) W/DEVICE KIT 1 kit by Does not apply route once.  . cholecalciferol (VITAMIN D) 1000 UNITS tablet Take 1,000 Units by mouth daily.    . clopidogrel (PLAVIX) 75 MG tablet Take 1 tablet (75 mg total) by mouth daily.  . clorazepate (TRANXENE) 7.5 MG tablet Take 1 tablet (7.5 mg total) by mouth as needed. (Patient taking differently: Take 7.5 mg by mouth daily as needed for anxiety. )  . docusate sodium (COLACE) 100 MG capsule Take 200 mg by mouth daily as needed for mild constipation.   . fluticasone (FLONASE) 50 MCG/ACT nasal spray Place 2 sprays into both nostrils daily as needed for allergies or rhinitis.  Marland Kitchen glucose blood (ONE TOUCH ULTRA TEST) test strip USE TO CHECK BLOOD SUGARS TWICE DAILY AS DIRECTED  . HYDROcodone-acetaminophen (NORCO/VICODIN) 5-325 MG tablet Take 1 tablet by mouth 3 (three) times daily as needed for moderate pain.  Marland Kitchen insulin regular (NOVOLIN R,HUMULIN R) 100 units/mL injection Inject 16 Units into the skin 2 (two) times daily before a meal.   . Insulin Syringe-Needle U-100 (INSULIN SYRINGE .5CC/31GX5/16") 31G X 5/16" 0.5 ML MISC USE TO INJECT INSULIN 5 TIMES PER DAY  . irbesartan (AVAPRO) 300 MG tablet TAKE 1 TABLET BY MOUTH DAILY  . isosorbide mononitrate (IMDUR) 30 MG 24 hr tablet Take 0.5 tablets (15 mg total) by mouth daily.  . Liraglutide (VICTOZA) 18 MG/3ML SOPN Inject 0.2 mLs (1.2 mg total) into the skin at bedtime. Inject 1.2 mg daily  . metFORMIN  (GLUCOPHAGE) 500 MG tablet TAKE 1 TABLET BY MOUTH EVERY MORNING AND 1 TABLETS EVERY EVENING  . Multiple Vitamin (MULTIVITAMIN) capsule Take 1 capsule by mouth daily.    Marland Kitchen neomycin-polymyxin-hydrocortisone (CORTISPORIN) otic solution 2-3 drops in each ear three times daily (Patient taking differently: Place 3 drops into both ears 3 (three) times daily as needed (Ear Pain). 2-3 drops in each ear three times daily)  . niacin (NIASPAN) 500 MG CR tablet Take 1 tablet (500 mg total) by mouth daily.  Marland Kitchen  omeprazole (PRILOSEC) 40 MG capsule TAKE 1 CAPSULE(40 MG) BY MOUTH DAILY  . VICTOZA 18 MG/3ML SOPN ADMINISTER 1.2 MG UNDER THE SKIN DAILY  . insulin NPH Human (HUMULIN N,NOVOLIN N) 100 UNIT/ML injection 20-36 Units 2 (two) times daily before a meal. Inject 36 units into the skin every morning and inject 20 units into the skin at bedtime.  . metFORMIN (GLUCOPHAGE) 500 MG tablet TAKE 1 TABLET BY MOUTH EVERY MORNING AND 2 TABLETS EVERY EVENING   No facility-administered encounter medications on file as of 01/07/2016.     Activities of Daily Living In your present state of health, do you have any difficulty performing the following activities: 01/07/2016 09/16/2015  Hearing? N -  Vision? N -  Difficulty concentrating or making decisions? N -  Walking or climbing stairs? N -  Dressing or bathing? N -  Doing errands, shopping? N N  Preparing Food and eating ? N -  Using the Toilet? N -  In the past six months, have you accidently leaked urine? N -  Do you have problems with loss of bowel control? N -  Managing your Medications? N -  Managing your Finances? N -  Housekeeping or managing your Housekeeping? N -  Some recent data might be hidden    Patient Care Team: Colon Branch, MD as PCP - General (Internal Medicine) [name], MD as Consulting Physician (Oncology) Elayne Snare, MD as Consulting Physician (Endocrinology)    Assessment:    Physical assessment deferred to PCP.  Exercise Activities  and Dietary recommendations Current Exercise Habits: The patient does not participate in regular exercise at present, Exercise limited by: None identified   Diet (meal preparation, eat out, water intake, caffeinated beverages, dairy products, fruits and vegetables): in general, a "healthy" diet  , on average, 2 meals per day  Goals    . increase water intake.      Fall Risk Fall Risk  01/07/2016 08/30/2015 08/30/2015 02/24/2015 08/28/2014  Falls in the past year? No No No No No   Depression Screen PHQ 2/9 Scores 01/07/2016 08/30/2015 08/28/2014 04/11/2014  PHQ - 2 Score 0 0 0 0     Cognitive Function MMSE - Mini Mental State Exam 01/07/2016  Orientation to time 5  Orientation to Place 5  Registration 3  Attention/ Calculation 5  Recall 3  Language- name 2 objects 2  Language- repeat 1  Language- follow 3 step command 3  Language- read & follow direction 1  Write a sentence 1  Copy design 0  Total score 29        Immunization History  Administered Date(s) Administered  . H1N1 12/10/2007  . Influenza Split 10/03/2010, 11/09/2011  . Influenza Whole 10/26/2004, 11/05/2008, 10/15/2009  . Influenza, High Dose Seasonal PF 11/30/2015  . Influenza,inj,Quad PF,36+ Mos 11/12/2012, 11/03/2013  . Influenza-Unspecified 11/16/2014  . Pneumococcal Conjugate-13 03/19/2013  . Pneumococcal Polysaccharide-23 10/13/2008  . Tdap 09/01/2010  . Zoster 03/19/2013   Screening Tests Health Maintenance  Topic Date Due  . FOOT EXAM  09/19/2014  . OPHTHALMOLOGY EXAM  09/17/2015  . HEMOGLOBIN A1C  05/22/2016  . COLONOSCOPY  11/02/2017  . TETANUS/TDAP  08/31/2020  . INFLUENZA VACCINE  Completed  . DEXA SCAN  Completed  . ZOSTAVAX  Completed  . PNA vac Low Risk Adult  Completed      Plan:     Pick up new medication from pharmacy.  Continue checking blood pressure at home and keeping a record of it.  Follow up 02/11/16 @1045  for blood pressure check with nurse.  Continue to eat heart healthy diet  (full of fruits, vegetables, whole grains, lean protein, water--limit salt, fat, and sugar intake) and increase physical activity as tolerated. Continue doing brain stimulating activities (puzzles, reading, adult coloring books, staying active) to keep memory sharp.   During the course of the visit the patient was educated and counseled about the following appropriate screening and preventive services:   Vaccines to include Pneumoccal, Influenza, Hepatitis B, Td, Zostavax, HCV: UTD  Cardiovascular Disease: BP today 152/78. Pt verbalizes compliance with current medication regimen.Dr.Paz notified. Orders given for pt to start Amlodipine 75m po daily and return in 6 weeks for nurse visit/ BP check. Pt agreeable.  Colorectal cancer screening: UTD  Bone density screening UTD  Diabetes screening Foot exam done today with normal findings.  Glaucoma screening Done by Dr.Sanders per pt.  Mammography/PAP  Nutrition counseling   Patient Instructions (the written plan) was given to the patient.   BNaaman PlummerAStickney [place] 01/07/2016  JKathlene November MD

## 2016-01-06 NOTE — Progress Notes (Signed)
Pre visit review using our clinic review tool, if applicable. No additional management support is needed unless otherwise documented below in the visit note. 

## 2016-01-07 ENCOUNTER — Ambulatory Visit (INDEPENDENT_AMBULATORY_CARE_PROVIDER_SITE_OTHER): Payer: Medicare Other | Admitting: *Deleted

## 2016-01-07 ENCOUNTER — Encounter: Payer: Self-pay | Admitting: *Deleted

## 2016-01-07 VITALS — BP 152/78 | HR 76 | Ht 66.0 in | Wt 171.4 lb

## 2016-01-07 DIAGNOSIS — Z0000 Encounter for general adult medical examination without abnormal findings: Secondary | ICD-10-CM | POA: Diagnosis not present

## 2016-01-07 DIAGNOSIS — I10 Essential (primary) hypertension: Secondary | ICD-10-CM

## 2016-01-07 MED ORDER — AMLODIPINE BESYLATE 5 MG PO TABS: 5.0000 mg | ORAL_TABLET | Freq: Every day | ORAL | 3 refills | Status: DC

## 2016-01-07 NOTE — Patient Instructions (Signed)
Pick up new medication from pharmacy.  Continue checking blood pressure at home and keeping a record of it. Follow up 02/11/16 @1045  for blood pressure check with nurse.  Continue to eat heart healthy diet (full of fruits, vegetables, whole grains, lean protein, water--limit salt, fat, and sugar intake) and increase physical activity as tolerated. Continue doing brain stimulating activities (puzzles, reading, adult coloring books, staying active) to keep memory sharp.

## 2016-02-08 DIAGNOSIS — E113219 Type 2 diabetes mellitus with mild nonproliferative diabetic retinopathy with macular edema, unspecified eye: Secondary | ICD-10-CM | POA: Diagnosis not present

## 2016-02-08 DIAGNOSIS — H25011 Cortical age-related cataract, right eye: Secondary | ICD-10-CM | POA: Diagnosis not present

## 2016-02-08 DIAGNOSIS — H2513 Age-related nuclear cataract, bilateral: Secondary | ICD-10-CM | POA: Diagnosis not present

## 2016-02-08 DIAGNOSIS — H2512 Age-related nuclear cataract, left eye: Secondary | ICD-10-CM | POA: Diagnosis not present

## 2016-02-08 DIAGNOSIS — H18411 Arcus senilis, right eye: Secondary | ICD-10-CM | POA: Diagnosis not present

## 2016-02-08 DIAGNOSIS — H25041 Posterior subcapsular polar age-related cataract, right eye: Secondary | ICD-10-CM | POA: Diagnosis not present

## 2016-02-11 ENCOUNTER — Telehealth: Payer: Self-pay | Admitting: Internal Medicine

## 2016-02-11 ENCOUNTER — Ambulatory Visit (INDEPENDENT_AMBULATORY_CARE_PROVIDER_SITE_OTHER): Payer: Medicare Other | Admitting: Internal Medicine

## 2016-02-11 VITALS — BP 136/70 | HR 74

## 2016-02-11 DIAGNOSIS — I10 Essential (primary) hypertension: Secondary | ICD-10-CM | POA: Diagnosis not present

## 2016-02-11 NOTE — Patient Instructions (Addendum)
Per Dr. Larose Kells: Continue current medications & regimen. Keep follow-up appointment with PCP on 04/04/16 at 10:45 AM.

## 2016-02-11 NOTE — Progress Notes (Signed)
Pre visit review using our clinic review tool, if applicable. No additional management support is needed unless otherwise documented below in the visit note.  Patient presents in clinic for blood pressure check per Clinical Support note 01/07/16. Verified current medications & regimen. Also, patient brought in a daily log of her blood pressure readings. Last 3 readings are as follow: 02/04/16 BP 130/68 P 70; 02/06/16 BP 133/66 P 69; 02/09/16 BP 127/63 P 71. During today's visit the readings were: BP 148/76 P 73 & BP 136/70 P 74. At this time, the patient denies headache, lightheadedness, dizziness & etc.  Per Dr. Larose Kells: Continue current medications & regimen. Keep follow-up appointment with PCP on 04/04/16 at 10:45 AM.  Informed patient of the provider's recommendations. She verbalized understanding and did not have any concerns before leaving the nurse visit.  Kathlene November, MD

## 2016-02-11 NOTE — Telephone Encounter (Signed)
Please advise 

## 2016-02-11 NOTE — Telephone Encounter (Signed)
Caller name: Relationship to patient:Self Can be reached: [phone]  Pharmacy:  Reason for call: Patient is having Eye surgery on 2/19 and needs a release form completed. States her eye doctor is faxing a clearance form to be completed and needs it completed by Tuesday 2/13. Plse adv if patient needs to come in for Surgical Clearance or if form can be completed based on her last visit (11/28).

## 2016-02-12 ENCOUNTER — Other Ambulatory Visit: Payer: Self-pay | Admitting: Internal Medicine

## 2016-02-14 NOTE — Telephone Encounter (Signed)
Let me know when we get the form

## 2016-02-14 NOTE — Telephone Encounter (Signed)
Paperwork received. Forwarded to PCP.

## 2016-02-14 NOTE — Telephone Encounter (Signed)
Form faxed to [place] [facility] [place] at [phone].

## 2016-02-14 NOTE — Telephone Encounter (Signed)
To have cataract surgery, she will have only topical anesthesia and will not need to stop any medication. She is clear for surgery.

## 2016-02-15 NOTE — Telephone Encounter (Signed)
Received fax confirmation 02/15/2016 at 0742.

## 2016-02-16 DIAGNOSIS — E113291 Type 2 diabetes mellitus with mild nonproliferative diabetic retinopathy without macular edema, right eye: Secondary | ICD-10-CM | POA: Diagnosis not present

## 2016-02-16 DIAGNOSIS — Q141 Congenital malformation of retina: Secondary | ICD-10-CM | POA: Diagnosis not present

## 2016-02-16 DIAGNOSIS — E113212 Type 2 diabetes mellitus with mild nonproliferative diabetic retinopathy with macular edema, left eye: Secondary | ICD-10-CM | POA: Diagnosis not present

## 2016-02-16 DIAGNOSIS — H43813 Vitreous degeneration, bilateral: Secondary | ICD-10-CM | POA: Diagnosis not present

## 2016-02-17 ENCOUNTER — Other Ambulatory Visit: Payer: Self-pay | Admitting: Endocrinology

## 2016-02-21 DIAGNOSIS — H2512 Age-related nuclear cataract, left eye: Secondary | ICD-10-CM | POA: Diagnosis not present

## 2016-02-22 DIAGNOSIS — H2511 Age-related nuclear cataract, right eye: Secondary | ICD-10-CM | POA: Diagnosis not present

## 2016-02-25 ENCOUNTER — Other Ambulatory Visit (INDEPENDENT_AMBULATORY_CARE_PROVIDER_SITE_OTHER): Payer: Medicare Other

## 2016-02-25 DIAGNOSIS — Z794 Long term (current) use of insulin: Secondary | ICD-10-CM | POA: Diagnosis not present

## 2016-02-25 DIAGNOSIS — E1165 Type 2 diabetes mellitus with hyperglycemia: Secondary | ICD-10-CM | POA: Diagnosis not present

## 2016-02-25 LAB — COMPREHENSIVE METABOLIC PANEL
ALT: 14 U/L (ref 0–35)
AST: 16 U/L (ref 0–37)
Albumin: 4 g/dL (ref 3.5–5.2)
Alkaline Phosphatase: 101 U/L (ref 39–117)
BILIRUBIN TOTAL: 0.4 mg/dL (ref 0.2–1.2)
BUN: 22 mg/dL (ref 6–23)
CALCIUM: 9.4 mg/dL (ref 8.4–10.5)
CO2: 27 meq/L (ref 19–32)
Chloride: 108 mEq/L (ref 96–112)
Creatinine, Ser: 1.38 mg/dL — ABNORMAL HIGH (ref 0.40–1.20)
GFR: 39.46 mL/min — AB (ref >60.00)
Glucose, Bld: 113 mg/dL — ABNORMAL HIGH (ref 70–99)
POTASSIUM: 4.1 meq/L (ref 3.5–5.1)
Sodium: 143 mEq/L (ref 135–145)
TOTAL PROTEIN: 6.9 g/dL (ref 6.0–8.3)

## 2016-02-25 LAB — MICROALBUMIN / CREATININE URINE RATIO
CREATININE, U: 157.7 mg/dL
MICROALB/CREAT RATIO: 14.2 mg/g (ref 0.0–30.0)
Microalb, Ur: 22.4 mg/dL — ABNORMAL HIGH (ref 0.0–1.9)

## 2016-02-25 LAB — HEMOGLOBIN A1C: Hgb A1c MFr Bld: 6.9 % — ABNORMAL HIGH (ref 4.6–6.5)

## 2016-02-29 ENCOUNTER — Encounter: Payer: Self-pay | Admitting: Endocrinology

## 2016-02-29 ENCOUNTER — Ambulatory Visit (INDEPENDENT_AMBULATORY_CARE_PROVIDER_SITE_OTHER): Payer: Medicare Other | Admitting: Endocrinology

## 2016-02-29 VITALS — BP 150/70 | HR 75 | Ht 66.0 in | Wt 175.0 lb

## 2016-02-29 DIAGNOSIS — E1165 Type 2 diabetes mellitus with hyperglycemia: Secondary | ICD-10-CM | POA: Diagnosis not present

## 2016-02-29 DIAGNOSIS — Z794 Long term (current) use of insulin: Secondary | ICD-10-CM | POA: Diagnosis not present

## 2016-02-29 NOTE — Progress Notes (Signed)
Patient ID: [name], female   DOB: [birth year], 76 y.o.   MRN: [id number]            Reason for Appointment: Followup for Type 2 Diabetes  Referring physician: Larose Kells  History of Present Illness:          Diagnosis: Type 2 diabetes mellitus, date of diagnosis: 1998       Past history:   She was treated with metformin at diagnosis when this had been continued until 2015 Subsequently Amaryl was also added several years ago and this has been continued Her blood sugars were under fair control between 2010 and early 2013 with A1c ranging from 7.4-9.4, mostly under 8% However since 08/2011 her A1c has been mostly over 8%  Insulin was added in 2014 with small doses of Lantus and this has been progressively increased She was started on mealtime insulin on her initial consultation in 9/15 because of high postprandial readings, sometimes over 300 With adding Victoza in 02/2014 her blood sugars were somewhat better with A1c coming down below 8%  Recent history:   INSULIN regimen: Regular 16 units before meals.  NPH 36 units in the morning and 20 hs  Non-insulin hypoglycemic drugs the patient is taking are:  Metformin 500 a.m., 1000 mg p.m. and Victoza 1.2 mg daily  A1c has been consistently under 7 It is now 6.9   Current blood sugar patterns, daily management and problems identified:  Fasting blood sugars: These are fluctuating somewhat although averaging slightly higher at 152; she is checking them at variable times between 6 AM 10:30 AM  Postprandial readings:  She does some readings in the afternoon but these are either before her late lunch or before supper Usually not checking readings after evening meal Blood sugars are quite variable around 6-7 PM and recently relatively higher She does take her insulin at lunchtime if she is eating a meal but usually does not  She has been variably active and much less in winter      HYPOGLYCEMIA: Minimal recently, only one relatively  low reading of 67 at 6 PM Side effects from medications have been: None Compliance with the medical regimen: good, she is taking her insulin consistently before meals   Glucose monitoring:  done 1-2 times a day         Glucometer: One Touch.      Blood Glucose readings by time of day from download:  Mean values apply above for all meters except median for One Touch  PRE-MEAL Fasting Lunch Dinner Bedtime Overall  Glucose range: 114-206  67-210  134, 161    Mean/median: 152  155   153     Self-care: The diet that the patient has been following is: none, usually eating low fat meals Meals: 2-3 meals per day at irregular times. Dinner 6-7 pm Breakfast is a cereal but sometimes cheese toast or oatmeal at 10 am, usually eating sandwiches for meals, snacks will be with crackers          Dietician visit, most recent: never.  She saw the CDE in 02/2014              Exercise: none recently  Weight history: 150-170 previously  Wt Readings from Last 3 Encounters:  02/29/16 175 lb (79.4 kg)  01/07/16 171 lb 6.4 oz (77.7 kg)  11/30/15 173 lb (78.5 kg)    Glycemic control:   Lab Results  Component Value Date   HGBA1C 6.9 (H) 02/25/2016  HGBA1C 6.2 11/23/2015   HGBA1C 7.0 (H) 05/18/2015   Lab Results  Component Value Date   MICROALBUR 22.4 (H) 02/25/2016   LDLCALC 45 07/27/2015   CREATININE 1.38 (H) 02/25/2016     OTHER active problems  discussed in review of systems   Allergies as of 02/29/2016      Reactions   Codeine    REACTION: makes her nervous, orTylenol #3   Ibuprofen    REACTION: nervous   Meperidine Hcl    REACTION: nasuea and vomitting   Naproxen Sodium    REACTION: nervous      Medication List       Accurate as of 02/29/16  8:50 PM. Always use your most recent med list.          amLODipine 5 MG tablet Commonly known as:  NORVASC Take 1 tablet (5 mg total) by mouth daily.   aspirin 81 MG EC tablet Take 1 tablet (81 mg total) by mouth daily.     atenolol 100 MG tablet Commonly known as:  TENORMIN Take 1 tablet (100 mg total) by mouth daily.   atorvastatin 20 MG tablet Commonly known as:  LIPITOR TAKE 1 TABLET(20 MG) BY MOUTH DAILY   azelastine 0.1 % nasal spray Commonly known as:  ASTELIN Place 2 sprays into both nostrils at bedtime as needed for rhinitis. Use in each nostril as directed   cholecalciferol 1000 units tablet Commonly known as:  VITAMIN D Take 1,000 Units by mouth daily.   clopidogrel 75 MG tablet Commonly known as:  PLAVIX Take 1 tablet (75 mg total) by mouth daily.   clorazepate 7.5 MG tablet Commonly known as:  TRANXENE Take 1 tablet (7.5 mg total) by mouth as needed.   docusate sodium 100 MG capsule Commonly known as:  COLACE Take 200 mg by mouth daily as needed for mild constipation.   fluticasone 50 MCG/ACT nasal spray Commonly known as:  FLONASE Place 2 sprays into both nostrils daily as needed for allergies or rhinitis.   glucose blood test strip Commonly known as:  ONE TOUCH ULTRA TEST USE TO CHECK BLOOD SUGARS TWICE DAILY AS DIRECTED   HYDROcodone-acetaminophen 5-325 MG tablet Commonly known as:  NORCO/VICODIN Take 1 tablet by mouth 3 (three) times daily as needed for moderate pain.   insulin NPH Human 100 UNIT/ML injection Commonly known as:  HUMULIN N,NOVOLIN N 20-36 Units 2 (two) times daily before a meal. Inject 36 units into the skin every morning and inject 20 units into the skin at bedtime.   insulin regular 250 units/2.30m (100 units/mL) injection Commonly known as:  NOVOLIN R,HUMULIN R Inject 16 Units into the skin 2 (two) times daily before a meal.   INSULIN SYRINGE .5CC/31GX5/16" 31G X 5/16" 0.5 ML Misc USE TO INJECT INSULIN 5 TIMES PER DAY   irbesartan 300 MG tablet Commonly known as:  AVAPRO TAKE 1 TABLET BY MOUTH DAILY   isosorbide mononitrate 30 MG 24 hr tablet Commonly known as:  IMDUR Take 0.5 tablets (15 mg total) by mouth daily.   metFORMIN 500 MG  tablet Commonly known as:  GLUCOPHAGE TAKE 1 TABLET BY MOUTH EVERY MORNING AND 1 TABLETS EVERY EVENING   multivitamin capsule Take 1 capsule by mouth daily.   neomycin-polymyxin-hydrocortisone otic solution Commonly known as:  CORTISPORIN 2-3 drops in each ear three times daily   niacin 500 MG CR tablet Commonly known as:  NIASPAN Take 1 tablet (500 mg total) by mouth daily.   omeprazole 40  MG capsule Commonly known as:  PRILOSEC TAKE 1 CAPSULE(40 MG) BY MOUTH DAILY   ONE TOUCH ULTRA SYSTEM KIT w/Device Kit 1 kit by Does not apply route once.   VICTOZA 18 MG/3ML Sopn Generic drug:  liraglutide ADMINISTER 1.2 MG UNDER THE SKIN DAILY       Allergies:  Allergies  Allergen Reactions  . Codeine     REACTION: makes her nervous, orTylenol #3  . Ibuprofen     REACTION: nervous  . Meperidine Hcl     REACTION: nasuea and vomitting  . Naproxen Sodium     REACTION: nervous    Past Medical History:  Diagnosis Date  . Acute blood loss anemia 09/17/2015  . Anemia   . Anginal pain ([place]) 2017  . Arthritis    "hands and knees mostly" (09/16/2015)  . B12 deficiency anemia   . CAD (coronary artery disease)    Dr Gwenlyn Found  . 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 Depression   . DJD (degenerative joint disease)   . 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 Gastritis   . GERD (gastroesophageal reflux disease)   . Headache(784.0)   . History of blood transfusion    "I"ve had 20 some; thru birth of children, loss of blood, last 2 were in ~ 1999 before my cancer surgery" (09/16/2015)  . History of hiatal hernia   . History of shingles   . Hx of colonic polyps   . Hyperlipidemia   . Hypertension   . 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 Pneumonia    X 1  . Postoperative hematoma involving circulatory system following cardiac catheterization 09/17/2015  . S/P cardiac cath 09/16/15 09/17/2015  . Type II diabetes mellitus ([place])   . 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 Venous insufficiency     Past Surgical History:  Procedure Laterality Date  . CARDIAC CATHETERIZATION  1997; 2008; 09/16/2015  . CARDIAC CATHETERIZATION N/A 09/16/2015   Procedure: Right/Left Heart Cath and Coronary/Graft Angiography;  Surgeon: Lorretta Harp, MD;  Location: [facility];  Service: Cardiovascular;  Laterality: N/A;  . CESAREAN SECTION  1966  . CORONARY ARTERY BYPASS GRAFT  1997   CABG X5  . EYE SURGERY     2 laser surgeries on left eye with implant  . FRACTURE SURGERY    . HERNIA REPAIR    . LAPAROSCOPIC ASSISTED VENTRAL HERNIA REPAIR  09/2008    with incarcerated colon;  Dr. Lucia Gaskins  . PATELLA FRACTURE SURGERY Left 11/1994   "crushed my knee"; put in a plate & 6 screws"  . REFRACTIVE SURGERY Left 07/30/2015  . 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 March Rummage  . SHOULDER ARTHROSCOPY W/ ROTATOR CUFF REPAIR Right 1997  . TONSILLECTOMY  1960s  . TOTAL KNEE ARTHROPLASTY WITH HARDWARE REMOVAL Left 12/1994    (infex, hardware removed )  . TUBAL LIGATION  1966    Family History  Problem Relation Age of Onset  . Heart disease Mother 49  . Heart disease Father 58    MI  . Hypertension Child   . Heart attack Child   . Heart disease Maternal Aunt     x 2, all deceased  . Heart disease Maternal Uncle     x 4, all deceased  . Emphysema Brother 26  . Colon cancer Neg Hx   . Breast cancer Neg Hx  Social History:  reports that she has never smoked. She has never used smokeless tobacco. She reports that she does not drink alcohol or use drugs.    Review of Systems         Lipids: Has had excellent control of hypercholesterolemia with long-term use of Crestor She has a history of coronary bypass surgery.   Also has been  on Niaspan, Prescribed by PCP, last HDL 48       Lab Results  Component Value Date   CHOL 128 07/27/2015   HDL 48 07/27/2015   LDLCALC 45 07/27/2015   LDLDIRECT 43.1  05/08/2012   TRIG 176 (H) 07/27/2015   CHOLHDL 2.7 07/27/2015                  The blood pressure has been  well controlled and she on 376m Avapro, amlodipine and atenolol.  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 Pedal pulses: absent left DP   Physical Examination:  BP (!) 150/70   Pulse 75   Ht 5' 6"  (1.676 m)   Wt 175 lb (79.4 kg)   SpO2 98%   BMI 28.25 kg/m   No edema present  Diabetes type 2, uncontrolled with BMI 27   See history of present illness for detailed discussion of her current blood sugar patterns, management and problems identified  Although her A1c is fairly good at 6.9 she has again variability in her blood sugars especially late afternoon  This is likely related to her using regular insulin and NPH Also has some variability in her mealtimes and diet Recently not very active excellent at 6.2 her blood sugars are fluctuating especially in the afternoon Overall control is adequate for her age and duration of diabetes  This is because of her variability and mealtimes, activity level and inconsistent once meal She has had a couple of relatively low sugars midday and afternoon and some of the readings before suppertime are as  low as 76 Most likely the variability in blood sugars are related to her taking NPH and Regular Insulin She will not be able to afford newer analog insulin formulations Currently metformin dose is 500 mg twice a day, reduced because of mild renal dysfunction  RENAL insufficiency: Her creatinine is still minimally increased  HYPERTENSION: Blood pressure is higher today, followed by PCP and now on amlodipine also Having mild dependent edema related to amlodipine  PLAN:   No change in insulin at this time, although her blood sugars maybe higher at times in the afternoon because of her variable mealtimes and previous tendency of her to have low  sugars will not increase NPH in the morning  However she does need to check more readings after evening meal to help adjust the dose of her suppertime Regular Insulin  Continue Victoza and metformin unchanged  Follow-up with PCP for management of hypertension, needs lipids on her next visit Needs foot exam on the next visit  There are no Patient Instructions on file for this visit.   Pieper Kasik 02/29/2016, 8:50 PM   Note: This office note was prepared with Dragon voice recognition system technology. Any transcriptional errors that result from this process are unintentional.

## 2016-03-01 ENCOUNTER — Ambulatory Visit: Payer: Medicare Other | Admitting: Family

## 2016-03-01 ENCOUNTER — Other Ambulatory Visit: Payer: Medicare Other

## 2016-03-06 DIAGNOSIS — H2511 Age-related nuclear cataract, right eye: Secondary | ICD-10-CM | POA: Diagnosis not present

## 2016-03-09 ENCOUNTER — Other Ambulatory Visit: Payer: Self-pay | Admitting: Endocrinology

## 2016-03-09 ENCOUNTER — Other Ambulatory Visit: Payer: Self-pay | Admitting: Internal Medicine

## 2016-03-16 ENCOUNTER — Other Ambulatory Visit: Payer: Self-pay | Admitting: Endocrinology

## 2016-03-20 ENCOUNTER — Other Ambulatory Visit: Payer: Self-pay | Admitting: Gastroenterology

## 2016-04-04 ENCOUNTER — Encounter: Payer: Self-pay | Admitting: Internal Medicine

## 2016-04-04 ENCOUNTER — Ambulatory Visit (INDEPENDENT_AMBULATORY_CARE_PROVIDER_SITE_OTHER): Payer: Medicare Other | Admitting: Internal Medicine

## 2016-04-04 VITALS — BP 128/78 | HR 78 | Temp 97.7°F | Resp 14 | Ht 66.0 in | Wt 178.4 lb

## 2016-04-04 DIAGNOSIS — J3489 Other specified disorders of nose and nasal sinuses: Secondary | ICD-10-CM

## 2016-04-04 DIAGNOSIS — I10 Essential (primary) hypertension: Secondary | ICD-10-CM

## 2016-04-04 DIAGNOSIS — R0602 Shortness of breath: Secondary | ICD-10-CM

## 2016-04-04 MED ORDER — HYDROCODONE-ACETAMINOPHEN 5-325 MG PO TABS: 1.0000 | ORAL_TABLET | Freq: Three times a day (TID) | ORAL | 0 refills | Status: DC | PRN

## 2016-04-04 MED ORDER — AMLODIPINE BESYLATE 5 MG PO TABS: 5.0000 mg | ORAL_TABLET | Freq: Every day | ORAL | 1 refills | Status: DC

## 2016-04-04 NOTE — Progress Notes (Signed)
Subjective:    Patient ID: [name], female    DOB: [birth year], 76 y.o.   MRN: [id number]  DOS:  04/04/2016 Type of visit - description : rov Interval history: Good medication compliance. Labs reviewed, up-to-date on all labs Skin lesion @ nose for a while, denies any itching or bleeding. She again complained of decreased energy, when she does yard work or walks up hill in her yard she needs to sit down and rest. She has DOE but denies exertional chest pain.  Review of Systems  Denies cough, wheezing. No claudication Mild left lower extremity edema worse at the end of the day  Past Medical History:  Diagnosis Date  . Acute blood loss anemia 09/17/2015  . Anemia   . Anginal pain ([place]) 2017  . Arthritis    "hands and knees mostly" (09/16/2015)  . B12 deficiency anemia   . CAD (coronary artery disease)    Dr Gwenlyn Found  . CLL (chronic lymphocytic leukemia) ([place]) 02/25/2014  . Depression   . DJD (degenerative joint disease)   . 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 Gastritis   . GERD (gastroesophageal reflux disease)   . Headache(784.0)   . History of blood transfusion    "I"ve had 20 some; thru birth of children, loss of blood, last 2 were in ~ 1999 before my cancer surgery" (09/16/2015)  . History of hiatal hernia   . History of shingles   . Hx of colonic polyps   . Hyperlipidemia   . Hypertension   . 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 Pneumonia    X 1  . Postoperative hematoma involving circulatory system following cardiac catheterization 09/17/2015  . S/P cardiac cath 09/16/15 09/17/2015  . Type II diabetes mellitus ([place])   . 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[facility])    as a child  . Venous insufficiency     Past Surgical History:  Procedure Laterality Date  . CARDIAC CATHETERIZATION  1997; 2008; 09/16/2015  . CARDIAC CATHETERIZATION N/A 09/16/2015   Procedure: Right/Left Heart Cath and Coronary/Graft Angiography;  Surgeon:  Lorretta Harp, MD;  Location: [place];  Service: Cardiovascular;  Laterality: N/A;  . CESAREAN SECTION  1966  . CORONARY ARTERY BYPASS GRAFT  1997   CABG X5  . EYE SURGERY     2 laser surgeries on left eye with implant  . EYE SURGERY Bilateral    cataracts  . FRACTURE SURGERY    . HERNIA REPAIR    . LAPAROSCOPIC ASSISTED VENTRAL HERNIA REPAIR  09/2008    with incarcerated colon;  Dr. Lucia Gaskins  . PATELLA FRACTURE SURGERY Left 11/1994   "crushed my knee"; put in a plate & 6 screws"  . REFRACTIVE SURGERY Left 07/30/2015  . 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 March Rummage  . SHOULDER ARTHROSCOPY W/ ROTATOR CUFF REPAIR Right 1997  . TONSILLECTOMY  1960s  . TOTAL KNEE ARTHROPLASTY WITH HARDWARE REMOVAL Left 12/1994    (infex, hardware removed )  . TUBAL LIGATION  1966    Social History   Social History  . Marital status: Widowed    Spouse name: N/A  . Number of children: 2  . Years of education: N/A   Occupational History  . retired-- [employer]    .  Retired   Social History Main Topics  . Smoking status: Never Smoker  . Smokeless tobacco: Never Used  . Alcohol use No  . Drug use:  No  . Sexual activity: No   Other Topics Concern  . Not on file   Social History Narrative   Lives by herself, lost husband ~ 2004       Allergies as of 04/04/2016      Reactions   Codeine    REACTION: makes her nervous, orTylenol #3   Ibuprofen    REACTION: nervous   Meperidine Hcl    REACTION: nasuea and vomitting   Naproxen Sodium    REACTION: nervous      Medication List       Accurate as of 04/04/16 11:59 PM. Always use your most recent med list.          amLODipine 5 MG tablet Commonly known as:  NORVASC Take 1 tablet (5 mg total) by mouth daily.   aspirin 81 MG EC tablet Take 1 tablet (81 mg total) by mouth daily.   atenolol 100 MG tablet Commonly known as:  TENORMIN Take 1 tablet (100 mg total) by mouth daily.   atorvastatin 20 MG  tablet Commonly known as:  LIPITOR TAKE 1 TABLET(20 MG) BY MOUTH DAILY   azelastine 0.1 % nasal spray Commonly known as:  ASTELIN Place 2 sprays into both nostrils at bedtime as needed for rhinitis. Use in each nostril as directed   cholecalciferol 1000 units tablet Commonly known as:  VITAMIN D Take 1,000 Units by mouth daily.   clopidogrel 75 MG tablet Commonly known as:  PLAVIX Take 1 tablet (75 mg total) by mouth daily.   clorazepate 7.5 MG tablet Commonly known as:  TRANXENE Take 1 tablet (7.5 mg total) by mouth as needed.   docusate sodium 100 MG capsule Commonly known as:  COLACE Take 200 mg by mouth daily as needed for mild constipation.   fluticasone 50 MCG/ACT nasal spray Commonly known as:  FLONASE Place 2 sprays into both nostrils daily as needed for allergies or rhinitis.   glucose blood test strip Commonly known as:  ONE TOUCH ULTRA TEST USE TO CHECK BLOOD SUGARS TWICE DAILY AS DIRECTED   HYDROcodone-acetaminophen 5-325 MG tablet Commonly known as:  NORCO/VICODIN Take 1 tablet by mouth 3 (three) times daily as needed for moderate pain.   insulin NPH Human 100 UNIT/ML injection Commonly known as:  HUMULIN N,NOVOLIN N 20-36 Units 2 (two) times daily before a meal. Inject 36 units into the skin every morning and inject 20 units into the skin at bedtime.   insulin regular 250 units/2.57m (100 units/mL) injection Commonly known as:  NOVOLIN R,HUMULIN R Inject 16 Units into the skin 2 (two) times daily before a meal.   INSULIN SYRINGE .5CC/31GX5/16" 31G X 5/16" 0.5 ML Misc USE TO INJECT INSULIN 5 TIMES PER DAY   irbesartan 300 MG tablet Commonly known as:  AVAPRO TAKE 1 TABLET BY MOUTH DAILY   isosorbide mononitrate 30 MG 24 hr tablet Commonly known as:  IMDUR Take 0.5 tablets (15 mg total) by mouth daily.   metFORMIN 500 MG tablet Commonly known as:  GLUCOPHAGE TAKE 1 TABLET BY MOUTH EVERY MORNING AND 2 TABLETS EVERY EVENING   multivitamin  capsule Take 1 capsule by mouth daily.   neomycin-polymyxin-hydrocortisone otic solution Commonly known as:  CORTISPORIN 2-3 drops in each ear three times daily   niacin 500 MG CR tablet Commonly known as:  NIASPAN Take 1 tablet (500 mg total) by mouth daily.   omeprazole 40 MG capsule Commonly known as:  PRILOSEC TAKE ONE CAPSULE BY MOUTH DAILY  ONE TOUCH ULTRA SYSTEM KIT w/Device Kit 1 kit by Does not apply route once.   VICTOZA 18 MG/3ML Sopn Generic drug:  liraglutide ADMINISTER 1.2 MG UNDER THE SKIN DAILY          Objective:   Physical Exam BP 128/78 (BP Location: Left Arm, Patient Position: Sitting, Cuff Size: Normal)   Pulse 78   Temp 97.7 F (36.5 C) (Oral)   Resp 14   Ht _0  (1.676 m)   Wt 178 lb 6 oz (80.9 kg)   SpO2 97%   BMI 28.79 kg/m  General:   Well developed, well nourished . NAD.  HEENT:  Normocephalic . Face symmetric, atraumatic. Has a slightly hyperpigmented, papular, round, 0.8 cm skin lesion @ nose  Lungs:  CTA B Normal respiratory effort, no intercostal retractions, no accessory muscle use. Heart: RRR,  no murmur.  Trace pretibial edema on the left  Skin: Not pale. Not jaundice Neurologic:  alert & oriented X3.  Speech normal, gait appropriate for age and unassisted Psych--  Cognition and judgment appear intact.  Cooperative with normal attention span and concentration.  Behavior appropriate. No anxious or depressed appearing.      Assessment & Plan:   Assessment> DM  Dr Dwyane Dee  +retinopathy  HTN Hyperlipidemia CRI creat ~1.4  (GFR ~ 38) Depression/anxiety: tranxene qhs prn (takes rarely) DJD -- hydrocodone (rx pcp) Chronic lymphocytic leukemia-2016, Dr Marin Olp H/o anemia  B12 def CAD, Dr Gwenlyn Found MI 97 >> CABG, cath 12-13-2006, myoview 2012 no ischemic CP: Stress test 08/05/2015, indeterminate risk study, + lateral ischemia: Cardiac catheterization 09/16/2015: Rx medical Venous insuff GI:  Dr  Silverio Decamp ---GERD ---IBS ---Gastritis ---H/o ulcerative colitis as a child H/o shingles    PLAN HTN: Seems controlled, continue Imdur, Avapro, Tenormin. Last BMP okay DOE: For a while, not associated with cough or wheezing, she has no asthma or COPD. Chest x-ray and cardiac catheterization okay 09-2015. O2 sat did not drop severely w/ exertion today  (97%  >> 93%). Likely deconditioning. Discussed cardiac rehabilitation-- will refer . Bone density test : T score -0.8 on December 2017, h/o a foot FX d/t walking years ago (likely a stress fracture), discussed treatment, agreed on exercise and cont Vit d Skin lesion, nose: Refer to dermatology RTC 4 months, CPX

## 2016-04-04 NOTE — Progress Notes (Signed)
Pre visit review using our clinic review tool, if applicable. No additional management support is needed unless otherwise documented below in the visit note. 

## 2016-04-04 NOTE — Patient Instructions (Signed)
GO TO THE LAB : Get the blood work     GO TO THE FRONT DESK Schedule your next appointment for a   routine checkup in 4 months 

## 2016-04-05 NOTE — Assessment & Plan Note (Signed)
HTN: Seems controlled, continue Imdur, Avapro, Tenormin. Last BMP okay DOE: For a while, not associated with cough or wheezing, she has no asthma or COPD. Chest x-ray and cardiac catheterization okay 09-2015. O2 sat did not drop severely w/ exertion today  (97%  >> 93%). Likely deconditioning. Discussed cardiac rehabilitation-- will refer . Bone density test : T score -0.8 on December 2017, h/o a foot FX d/t walking years ago (likely a stress fracture), discussed treatment, agreed on exercise and cont Vit d Skin lesion, nose: Refer to dermatology RTC 4 months, CPX

## 2016-04-13 ENCOUNTER — Other Ambulatory Visit: Payer: Self-pay | Admitting: Endocrinology

## 2016-04-17 ENCOUNTER — Telehealth: Payer: Self-pay | Admitting: Internal Medicine

## 2016-04-17 ENCOUNTER — Other Ambulatory Visit (HOSPITAL_COMMUNITY): Payer: Self-pay | Admitting: *Deleted

## 2016-04-17 DIAGNOSIS — R0602 Shortness of breath: Secondary | ICD-10-CM

## 2016-04-17 DIAGNOSIS — I25810 Atherosclerosis of coronary artery bypass graft(s) without angina pectoris: Secondary | ICD-10-CM

## 2016-04-17 DIAGNOSIS — I208 Other forms of angina pectoris: Secondary | ICD-10-CM

## 2016-04-17 NOTE — Telephone Encounter (Signed)
Referral reordered.

## 2016-04-17 NOTE — Telephone Encounter (Signed)
Cardiac rehab , dx CAD, SOB

## 2016-04-17 NOTE — Telephone Encounter (Signed)
Caller name: [place] Relation to pt: nurse  Call back number: [place]:  Reason for call: Pt was referral with a diagnose of shortness of breath, [place] wants to know if provider wanted pt to have pulmonary rehab or cardiac rehab instead, if cardiac rehab nurse from [facility] stated referral needs to be changed with diagnose. Please advise.

## 2016-04-17 NOTE — Telephone Encounter (Signed)
Please advise 

## 2016-04-18 ENCOUNTER — Other Ambulatory Visit: Payer: Self-pay | Admitting: Dermatology

## 2016-04-18 DIAGNOSIS — L218 Other seborrheic dermatitis: Secondary | ICD-10-CM | POA: Diagnosis not present

## 2016-04-18 DIAGNOSIS — D485 Neoplasm of uncertain behavior of skin: Secondary | ICD-10-CM | POA: Diagnosis not present

## 2016-04-18 DIAGNOSIS — C44311 Basal cell carcinoma of skin of nose: Secondary | ICD-10-CM | POA: Diagnosis not present

## 2016-04-19 ENCOUNTER — Encounter: Payer: Self-pay | Admitting: Cardiology

## 2016-04-19 ENCOUNTER — Telehealth (HOSPITAL_COMMUNITY): Payer: Self-pay | Admitting: Internal Medicine

## 2016-04-19 ENCOUNTER — Ambulatory Visit (INDEPENDENT_AMBULATORY_CARE_PROVIDER_SITE_OTHER): Payer: Medicare Other | Admitting: Cardiology

## 2016-04-19 VITALS — BP 142/82 | HR 74 | Ht 66.0 in | Wt 177.0 lb

## 2016-04-19 DIAGNOSIS — Z951 Presence of aortocoronary bypass graft: Secondary | ICD-10-CM | POA: Diagnosis not present

## 2016-04-19 DIAGNOSIS — N183 Chronic kidney disease, stage 3 unspecified: Secondary | ICD-10-CM

## 2016-04-19 DIAGNOSIS — Z794 Long term (current) use of insulin: Secondary | ICD-10-CM | POA: Diagnosis not present

## 2016-04-19 DIAGNOSIS — E118 Type 2 diabetes mellitus with unspecified complications: Secondary | ICD-10-CM

## 2016-04-19 DIAGNOSIS — R0602 Shortness of breath: Secondary | ICD-10-CM

## 2016-04-19 DIAGNOSIS — R0609 Other forms of dyspnea: Secondary | ICD-10-CM | POA: Diagnosis not present

## 2016-04-19 DIAGNOSIS — IMO0001 Reserved for inherently not codable concepts without codable children: Secondary | ICD-10-CM

## 2016-04-19 DIAGNOSIS — E785 Hyperlipidemia, unspecified: Secondary | ICD-10-CM

## 2016-04-19 DIAGNOSIS — I208 Other forms of angina pectoris: Secondary | ICD-10-CM

## 2016-04-19 DIAGNOSIS — C9110 Chronic lymphocytic leukemia of B-cell type not having achieved remission: Secondary | ICD-10-CM

## 2016-04-19 DIAGNOSIS — I10 Essential (primary) hypertension: Secondary | ICD-10-CM | POA: Diagnosis not present

## 2016-04-19 DIAGNOSIS — C9190 Lymphoid leukemia, unspecified not having achieved remission: Secondary | ICD-10-CM | POA: Diagnosis not present

## 2016-04-19 DIAGNOSIS — R0600 Dyspnea, unspecified: Secondary | ICD-10-CM

## 2016-04-19 MED ORDER — ISOSORBIDE MONONITRATE ER 30 MG PO TB24: 30.0000 mg | ORAL_TABLET | Freq: Every day | ORAL | 6 refills | Status: DC

## 2016-04-19 NOTE — Assessment & Plan Note (Signed)
CABG 1997 -patent LIMA-LAD, SVG-RI, SVG-PDA with unprotected native CFX disease- plan is for medical Rx

## 2016-04-19 NOTE — Assessment & Plan Note (Signed)
Last HGb A1c- 6.9

## 2016-04-19 NOTE — Assessment & Plan Note (Signed)
LDL was 45 July 2017

## 2016-04-19 NOTE — Progress Notes (Signed)
04/19/2016 [name]   04/18/1940  [id number]  Primary Physician Kathlene November, MD Primary Cardiologist: Dr Gwenlyn Found  HPI:  76 y/o female followed by Dr Gwenlyn Found with a long history of CAD. She had CABG in 1997. A Myoview done in Aug 2017 for chest pain was abnormal. She underwent coronary angiogram 09/16/15 which revealed patent grafts with disease in an unprotected CFX. Her LVF was normal. The plan was for continued medical Rx and Dr Gwenlyn Found added low dose Imdur (15 mg). She is in the office today for follow up. She has noted some atypical "sharp" shooting chest pain, but more concerning is she has noticed increased DOE doing housework and yard work. She denies orthopnea. She says she has noticed LE edema but I did not see this on exam today.    Current Outpatient Prescriptions  Medication Sig Dispense Refill  . amLODipine (NORVASC) 5 MG tablet Take 1 tablet (5 mg total) by mouth daily. 90 tablet 1  . aspirin EC 81 MG EC tablet Take 1 tablet (81 mg total) by mouth daily.    Marland Kitchen atenolol (TENORMIN) 100 MG tablet Take 1 tablet (100 mg total) by mouth daily. 90 tablet 1  . atorvastatin (LIPITOR) 20 MG tablet TAKE 1 TABLET(20 MG) BY MOUTH DAILY 30 tablet 9  . azelastine (ASTELIN) 0.1 % nasal spray Place 2 sprays into both nostrils at bedtime as needed for rhinitis. Use in each nostril as directed 30 mL 1  . Blood Glucose Monitoring Suppl (ONE TOUCH ULTRA SYSTEM KIT) W/DEVICE KIT 1 kit by Does not apply route once. 1 each 0  . cholecalciferol (VITAMIN D) 1000 UNITS tablet Take 1,000 Units by mouth daily.      . clopidogrel (PLAVIX) 75 MG tablet Take 1 tablet (75 mg total) by mouth daily. 90 tablet 2  . clorazepate (TRANXENE) 7.5 MG tablet Take 1 tablet (7.5 mg total) by mouth as needed. (Patient taking differently: Take 7.5 mg by mouth daily as needed for anxiety. ) 30 tablet 5  . docusate sodium (COLACE) 100 MG capsule Take 200 mg by mouth daily as needed for mild constipation.     . fluticasone  (FLONASE) 50 MCG/ACT nasal spray Place 2 sprays into both nostrils daily as needed for allergies or rhinitis. 16 g 5  . HYDROcodone-acetaminophen (NORCO/VICODIN) 5-325 MG tablet Take 1 tablet by mouth 3 (three) times daily as needed for moderate pain. 90 tablet 0  . insulin NPH Human (HUMULIN N,NOVOLIN N) 100 UNIT/ML injection 20-36 Units 2 (two) times daily before a meal. Inject 36 units into the skin every morning and inject 20 units into the skin at bedtime.    . insulin regular (NOVOLIN R,HUMULIN R) 100 units/mL injection Inject 16 Units into the skin 2 (two) times daily before a meal.     . Insulin Syringe-Needle U-100 (INSULIN SYRINGE .5CC/31GX5/16") 31G X 5/16" 0.5 ML MISC USE TO INJECT INSULIN 5 TIMES PER DAY 200 each 0  . irbesartan (AVAPRO) 300 MG tablet TAKE 1 TABLET BY MOUTH DAILY 90 tablet 3  . isosorbide mononitrate (IMDUR) 30 MG 24 hr tablet Take 1 tablet (30 mg total) by mouth daily. 30 tablet 6  . metFORMIN (GLUCOPHAGE) 500 MG tablet TAKE 1 TABLET BY MOUTH EVERY MORNING AND 2 TABLETS EVERY EVENING 270 tablet 0  . Multiple Vitamin (MULTIVITAMIN) capsule Take 1 capsule by mouth daily.      Marland Kitchen neomycin-polymyxin-hydrocortisone (CORTISPORIN) otic solution 2-3 drops in each ear three times daily (Patient  taking differently: Place 3 drops into both ears 3 (three) times daily as needed (Ear Pain). 2-3 drops in each ear three times daily) 10 mL 2  . niacin (NIASPAN) 500 MG CR tablet Take 1 tablet (500 mg total) by mouth daily. 90 tablet 1  . omeprazole (PRILOSEC) 40 MG capsule TAKE ONE CAPSULE BY MOUTH DAILY 90 capsule 0  . ONE TOUCH ULTRA TEST test strip USE TO CHECK BLOOD SUGARS TWICE DAILY AS DIRECTED 100 each 2  . VICTOZA 18 MG/3ML SOPN ADMINISTER 1.2 MG UNDER THE SKIN DAILY 6 mL 0  . mometasone (ELOCON) 0.1 % cream Use as directed     No current facility-administered medications for this visit.     Allergies  Allergen Reactions  . Codeine     REACTION: makes her nervous, orTylenol  #3  . Ibuprofen     REACTION: nervous  . Meperidine Hcl     REACTION: nasuea and vomitting  . Naproxen Sodium     REACTION: nervous    Past Medical History:  Diagnosis Date  . Acute blood loss anemia 09/17/2015  . Anemia   . Anginal pain ([place]) 2017  . Arthritis    "hands and knees mostly" (09/16/2015)  . B12 deficiency anemia   . CAD (coronary artery disease)    Dr Gwenlyn Found  . CLL (chronic lymphocytic leukemia) ([place]) 02/25/2014  . Depression   . DJD (degenerative joint disease)   . 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 Gastritis   . GERD (gastroesophageal reflux disease)   . Headache(784.0)   . History of blood transfusion    "I"ve had 20 some; thru birth of children, loss of blood, last 2 were in ~ 1999 before my cancer surgery" (09/16/2015)  . History of hiatal hernia   . History of shingles   . Hx of colonic polyps   . Hyperlipidemia   . Hypertension   . 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 Pneumonia    X 1  . Postoperative hematoma involving circulatory system following cardiac catheterization 09/17/2015  . S/P cardiac cath 09/16/15 09/17/2015  . Type II diabetes mellitus ([place])   . 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 Venous insufficiency     Social History   Social History  . Marital status: Widowed    Spouse name: N/A  . Number of children: 2  . Years of education: N/A   Occupational History  . retired-- [employer]    .  Retired   Social History Main Topics  . Smoking status: Never Smoker  . Smokeless tobacco: Never Used  . Alcohol use No  . Drug use: No  . Sexual activity: No   Other Topics Concern  . Not on file   Social History Narrative   Lives by herself, lost husband ~ 2004      Family History  Problem Relation Age of Onset  . Heart disease Mother 87  . Heart disease Father 74    MI  . Hypertension Child   . Heart attack Child   . Heart disease Maternal Aunt     x 2, all deceased  .  Heart disease Maternal Uncle     x 4, all deceased  . Emphysema Brother 12  . Colon cancer Neg Hx   . Breast cancer Neg Hx      Review of Systems: General: negative for chills, fever, night sweats or weight changes.  Cardiovascular:  negative for chest pain, dyspnea on exertion, edema, orthopnea, palpitations, paroxysmal nocturnal dyspnea or shortness of breath Dermatological: negative for rash Respiratory: negative for cough or wheezing Urologic: negative for hematuria Abdominal: negative for nausea, vomiting, diarrhea, bright red blood per rectum, melena, or hematemesis Neurologic: negative for visual changes, syncope, or dizziness All other systems reviewed and are otherwise negative except as noted above.    Blood pressure (!) 142/82, pulse 74, height 5' 6"  (1.676 m), weight 177 lb (80.3 kg).  General appearance: alert, cooperative, no distress and mildly obese Neck: no carotid bruit and no JVD Lungs: clear to auscultation bilaterally Heart: regular rate and rhythm Extremities: no edema Skin: Skin color, texture, turgor normal. No rashes or lesions Neurologic: Grossly normal  EKG NSR, incomplete RBBB  ASSESSMENT AND PLAN:   DOE (dyspnea on exertion) R/O anginal equivalent  Hx of CABG CABG 1997 -patent LIMA-LAD, SVG-RI, SVG-PDA with unprotected native CFX disease- plan is for medical Rx  Chronic kidney disease, stage III (moderate) Last SCr 1.38 Feb 2018  Essential hypertension Controlled  Dyslipidemia LDL was 45 July 2017  CLL (chronic lymphocytic leukemia) Followed by Dr York Pellant WBC 18K Nov 2017  Insulin dependent diabetes mellitus with complications (HCC) Last HGb A1c- 6.9   PLAN  Ms Metayer has chest pain that sounds atypical but she also has what she describes as new DOE which is more concerning. I suggested an echo and that she increase her Imdur to 30 mg daily. In a couple weeks she could increase her Imdur further to 60 mg daily if she felt it  helped.   [name] PA-C 04/19/2016 10:30 AM

## 2016-04-19 NOTE — Assessment & Plan Note (Signed)
Controlled.  

## 2016-04-19 NOTE — Telephone Encounter (Signed)
Patient insurance active and benefits verified. Medicare A/B - no co-payment, deductible $183.00/$183.00 has been met, 20% co-insurance, no pre-authorization and no limit on visit. Passport/reference # [id number]. AARP medicare supplement - follows medicare guidelines - no co-payment, no deductible, no co-insurance, no out of pocket, no limit on visit and no pre-authorization. Passport/reference # [id number].

## 2016-04-19 NOTE — Assessment & Plan Note (Signed)
Last SCr 1.38 Feb 2018

## 2016-04-19 NOTE — Patient Instructions (Addendum)
Medication Instructions:  INCREASE IMDUR TO 30 MG DAILY (WHOLE TABLET)  If you need a refill on your cardiac medications before your next appointment, please call your pharmacy.  Testing/Procedures: Echocardiogram - Your physician has requested that you have an echocardiogram. Echocardiography is a painless test that uses sound waves to create images of your heart. It provides your doctor with information about the size and shape of your heart and how well your heart's chambers and valves are working. This procedure takes approximately one hour. There are no restrictions for this procedure. This will be performed at our [facility] location - [address].  Follow-Up: Your physician wants you to follow-up in: [place] will receive a reminder letter in the mail two months in advance. If you don't receive a letter, please call our office July 2018 to schedule theOctober 2018   follow-up appointment.   Special Instructions: IF AFTER 2 WEEKS IF SYMPTOMS ARE BETTER BUT ARE STILL THERE OK TO INCREASE TO 60 MG DAILY (2 TABLETS) PLEASE CALL AND LET us KNOW ABOUT THE CHANGE    Thank you for choosing [facility] at [facility]!!

## 2016-04-19 NOTE — Telephone Encounter (Signed)
I called and left message on patient voicemail to call office about scheduling and participating in Cardiac Rehab. I left my contact information information on patient voicemail to return call.

## 2016-04-19 NOTE — Assessment & Plan Note (Signed)
Followed by Dr York Pellant WBC 18K Nov 2017

## 2016-04-19 NOTE — Assessment & Plan Note (Signed)
R/O anginal equivalent 

## 2016-05-01 ENCOUNTER — Other Ambulatory Visit: Payer: Self-pay | Admitting: Internal Medicine

## 2016-05-01 DIAGNOSIS — I10 Essential (primary) hypertension: Secondary | ICD-10-CM

## 2016-05-02 ENCOUNTER — Other Ambulatory Visit: Payer: Self-pay | Admitting: Internal Medicine

## 2016-05-02 DIAGNOSIS — I10 Essential (primary) hypertension: Secondary | ICD-10-CM

## 2016-05-03 ENCOUNTER — Ambulatory Visit (HOSPITAL_COMMUNITY): Payer: Medicare Other | Attending: Cardiology

## 2016-05-03 ENCOUNTER — Other Ambulatory Visit: Payer: Self-pay | Admitting: Internal Medicine

## 2016-05-03 ENCOUNTER — Telehealth: Payer: Self-pay | Admitting: *Deleted

## 2016-05-03 ENCOUNTER — Other Ambulatory Visit: Payer: Self-pay

## 2016-05-03 DIAGNOSIS — I10 Essential (primary) hypertension: Secondary | ICD-10-CM

## 2016-05-03 DIAGNOSIS — R0602 Shortness of breath: Secondary | ICD-10-CM | POA: Diagnosis not present

## 2016-05-03 NOTE — Telephone Encounter (Signed)
Left msg to call regarding results.

## 2016-05-03 NOTE — Telephone Encounter (Signed)
-----   Message from Erlene Quan, [place] sent at 05/03/2016  1:44 PM EDT ----- Please let the pt know her echo was OK- if she is doing OK f/u with Dr Gwenlyn Found in 6 months, if not she should see Dr Gwenlyn Found sooner.  [name] PA-C 05/03/2016 1:44 PM

## 2016-05-09 NOTE — Telephone Encounter (Signed)
i've spoken w patient regarding results - see f/u documentation on results.

## 2016-05-10 ENCOUNTER — Other Ambulatory Visit: Payer: Self-pay | Admitting: Internal Medicine

## 2016-05-15 ENCOUNTER — Ambulatory Visit (INDEPENDENT_AMBULATORY_CARE_PROVIDER_SITE_OTHER): Payer: Medicare Other | Admitting: Internal Medicine

## 2016-05-15 ENCOUNTER — Encounter: Payer: Self-pay | Admitting: Internal Medicine

## 2016-05-15 ENCOUNTER — Other Ambulatory Visit: Payer: Self-pay | Admitting: Endocrinology

## 2016-05-15 VITALS — BP 132/76 | HR 86 | Temp 98.6°F | Resp 14 | Ht 66.0 in | Wt 173.4 lb

## 2016-05-15 DIAGNOSIS — E785 Hyperlipidemia, unspecified: Secondary | ICD-10-CM

## 2016-05-15 DIAGNOSIS — J069 Acute upper respiratory infection, unspecified: Secondary | ICD-10-CM

## 2016-05-15 DIAGNOSIS — I10 Essential (primary) hypertension: Secondary | ICD-10-CM

## 2016-05-15 DIAGNOSIS — I208 Other forms of angina pectoris: Secondary | ICD-10-CM

## 2016-05-15 MED ORDER — AMOXICILLIN 500 MG PO CAPS: 1000.0000 mg | ORAL_CAPSULE | Freq: Two times a day (BID) | ORAL | 0 refills | Status: DC

## 2016-05-15 NOTE — Patient Instructions (Addendum)
Rest, fluids , tylenol  For cough:  Take Mucinex DM twice a day as needed until better  For nasal congestion: Use OTC Nasocort or Flonase : 2 nasal sprays on each side of the nose in the morning until you feel better   Take the antibiotic as prescribed  (Amoxicillin) only if you are not gradually improving in the next 4-5 days  Call if not gradually better over the next  10 days  Call anytime if the symptoms are severe  ======================  No more amlodipine but please check your blood pressure weekly. If is more than 140/90 consistently let me know  Okay to stop Niaspan

## 2016-05-15 NOTE — Progress Notes (Signed)
Pre visit review using our clinic review tool, if applicable. No additional management support is needed unless otherwise documented below in the visit note. 

## 2016-05-15 NOTE — Progress Notes (Signed)
Subjective:    Patient ID: Mar[place], female    DOB: [birth year], 76 y.o.   MRN: [id number]  DOS:  05/15/2016 Type of visit - description : acute Interval history:  Symptoms started a couple of days ago with sinus congestion, hoarseness. Was able to blow a small amount of mucus from the nose. She is coughing up some, very small amount of thick yellow sputum . Also, has not been able to get amlodipine for a while (apparently a problem with the pharmacy), no ambulatory BPs, wonders if she still needs the medication   Review of Systems No actual fever Some nausea and vomited once yesterday. Occasional wheezing, no difficulty breathing  Past Medical History:  Diagnosis Date  . Acute blood loss anemia 09/17/2015  . Anemia   . Anginal pain ([place]) 2017  . Arthritis    "hands and knees mostly" (09/16/2015)  . B12 deficiency anemia   . CAD (coronary artery disease)    Dr Gwenlyn Found  . CLL (chronic lymphocytic leukemia) ([place]) 02/25/2014  . Depression   . DJD (degenerative joint disease)   . 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 Gastritis   . GERD (gastroesophageal reflux disease)   . Headache(784.0)   . History of blood transfusion    "I"ve had 20 some; thru birth of children, loss of blood, last 2 were in ~ 1999 before my cancer surgery" (09/16/2015)  . History of hiatal hernia   . History of shingles   . Hx of colonic polyps   . Hyperlipidemia   . Hypertension   . 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 Pneumonia    X 1  . Postoperative hematoma involving circulatory system following cardiac catheterization 09/17/2015  . S/P cardiac cath 09/16/15 09/17/2015  . Type II diabetes mellitus ([place])   . 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 Venous insufficiency     Past Surgical History:  Procedure Laterality Date  . CARDIAC CATHETERIZATION  1997; 2008; 09/16/2015  . CARDIAC CATHETERIZATION N/A 09/16/2015   Procedure: Right/Left Heart  Cath and Coronary/Graft Angiography;  Surgeon: Lorretta Harp, MD;  Location: [place] [facility];  Service: Cardiovascular;  Laterality: N/A;  . CESAREAN SECTION  1966  . CORONARY ARTERY BYPASS GRAFT  1997   CABG X5  . EYE SURGERY     2 laser surgeries on left eye with implant  . EYE SURGERY Bilateral    cataracts  . FRACTURE SURGERY    . HERNIA REPAIR    . LAPAROSCOPIC ASSISTED VENTRAL HERNIA REPAIR  09/2008    with incarcerated colon;  Dr. Lucia Gaskins  . PATELLA FRACTURE SURGERY Left 11/1994   "crushed my knee"; put in a plate & 6 screws"  . REFRACTIVE SURGERY Left 07/30/2015  . 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 March Rummage  . SHOULDER ARTHROSCOPY W/ ROTATOR CUFF REPAIR Right 1997  . TONSILLECTOMY  1960s  . TOTAL KNEE ARTHROPLASTY WITH HARDWARE REMOVAL Left 12/1994    (infex, hardware removed )  . TUBAL LIGATION  1966    Social History   Social History  . Marital status: Widowed    Spouse name: N/A  . Number of children: 2  . Years of education: N/A   Occupational History  . retired-- [employer]    .  Retired   Social History Main Topics  . Smoking status: Never Smoker  . Smokeless tobacco: Never Used  .  Alcohol use No  . Drug use: No  . Sexual activity: No   Other Topics Concern  . Not on file   Social History Narrative   Lives by herself, lost husband ~ 2004       Allergies as of 05/15/2016      Reactions   Codeine    REACTION: makes her nervous, orTylenol #3   Ibuprofen    REACTION: nervous   Meperidine Hcl    REACTION: nasuea and vomitting   Naproxen Sodium    REACTION: nervous      Medication List       Accurate as of 05/15/16 11:59 PM. Always use your most recent med list.          amoxicillin 500 MG capsule Commonly known as:  AMOXIL Take 2 capsules (1,000 mg total) by mouth 2 (two) times daily.   aspirin 81 MG EC tablet Take 1 tablet (81 mg total) by mouth daily.   atenolol 100 MG tablet Commonly known as:  TENORMIN Take 1  tablet (100 mg total) by mouth daily.   atorvastatin 20 MG tablet Commonly known as:  LIPITOR TAKE 1 TABLET(20 MG) BY MOUTH DAILY   azelastine 0.1 % nasal spray Commonly known as:  ASTELIN Place 2 sprays into both nostrils at bedtime as needed for rhinitis. Use in each nostril as directed   cholecalciferol 1000 units tablet Commonly known as:  VITAMIN D Take 1,000 Units by mouth daily.   clopidogrel 75 MG tablet Commonly known as:  PLAVIX Take 1 tablet (75 mg total) by mouth daily.   clorazepate 7.5 MG tablet Commonly known as:  TRANXENE Take 1 tablet (7.5 mg total) by mouth as needed.   docusate sodium 100 MG capsule Commonly known as:  COLACE Take 200 mg by mouth daily as needed for mild constipation.   fluticasone 50 MCG/ACT nasal spray Commonly known as:  FLONASE Place 2 sprays into both nostrils daily as needed for allergies or rhinitis.   HYDROcodone-acetaminophen 5-325 MG tablet Commonly known as:  NORCO/VICODIN Take 1 tablet by mouth 3 (three) times daily as needed for moderate pain.   insulin NPH Human 100 UNIT/ML injection Commonly known as:  HUMULIN N,NOVOLIN N 20-36 Units 2 (two) times daily before a meal. Inject 36 units into the skin every morning and inject 20 units into the skin at bedtime.   insulin regular 100 units/mL injection Commonly known as:  NOVOLIN R,HUMULIN R Inject 16 Units into the skin 2 (two) times daily before a meal.   INSULIN SYRINGE .5CC/31GX5/16" 31G X 5/16" 0.5 ML Misc USE TO INJECT INSULIN 5 TIMES PER DAY   irbesartan 300 MG tablet Commonly known as:  AVAPRO TAKE 1 TABLET BY MOUTH DAILY   isosorbide mononitrate 30 MG 24 hr tablet Commonly known as:  IMDUR Take 1 tablet (30 mg total) by mouth daily.   metFORMIN 500 MG tablet Commonly known as:  GLUCOPHAGE TAKE 1 TABLET BY MOUTH EVERY MORNING AND 2 TABLETS EVERY EVENING   mometasone 0.1 % cream Commonly known as:  ELOCON Use as directed   multivitamin capsule Take 1  capsule by mouth daily.   neomycin-polymyxin-hydrocortisone otic solution Commonly known as:  CORTISPORIN 2-3 drops in each ear three times daily   omeprazole 40 MG capsule Commonly known as:  PRILOSEC TAKE ONE CAPSULE BY MOUTH DAILY   ONE TOUCH ULTRA SYSTEM KIT w/Device Kit 1 kit by Does not apply route once.   ONE TOUCH ULTRA TEST test  strip Generic drug:  glucose blood USE TO CHECK BLOOD SUGARS TWICE DAILY AS DIRECTED   VICTOZA 18 MG/3ML Sopn Generic drug:  liraglutide ADMINISTER 1.2 MG UNDER THE SKIN DAILY          Objective:   Physical Exam BP 132/76 (BP Location: Left Arm, Patient Position: Sitting, Cuff Size: Normal)   Pulse 86   Temp 98.6 F (37 C) (Oral)   Resp 14   Ht 5' 6"  (1.676 m)   Wt 173 lb 6 oz (78.6 kg)   SpO2 97%   BMI 27.98 kg/m  General:   Well developed, well nourished . NAD.  HEENT:  Normocephalic . Face symmetric, atraumatic. TMs normal, throat symmetric and not red, nose congested, sinuses no TTP. Voice hoarse Lungs:  Very few rhonchi with cough only. No wheezing. Normal respiratory effort, no intercostal retractions, no accessory muscle use. Heart: RRR,  no murmur.  No pretibial edema bilaterally  Skin: Not pale. Not jaundice Neurologic:  alert & oriented X3.  Speech normal, gait appropriate for age and unassisted Psych--  Cognition and judgment appear intact.  Cooperative with normal attention span and concentration.  Behavior appropriate. No anxious or depressed appearing.      Assessment & Plan:   Assessment DM  Dr Dwyane Dee  +retinopathy  HTN Hyperlipidemia CRI creat ~1.4  (GFR ~ 38) Depression/anxiety: tranxene qhs prn (takes rarely) DJD -- hydrocodone (rx pcp) Chronic lymphocytic leukemia-2016, Dr Marin Olp H/o anemia  B12 def CAD, Dr Gwenlyn Found MI 97 >> CABG, cath 12-13-2006, myoview 2012 no ischemic CP: Stress test 08/05/2015, indeterminate risk study, + lateral ischemia: Cardiac catheterization 09/16/2015: Rx  medical Venous insuff GI:  Dr Silverio Decamp ---GERD, IBS, h/o Gastritis ---H/o ulcerative colitis as a child H/o shingles  BCC Dr Allyson Sabal, bx 04-2015  PLAN URI, mild bronchitis: Supportive treatment, if not better will start amoxicillin. HTN: Started amlodipine ~ 01-2016, unable to get it for several weeks, BP remains normal, for now recommend to stop amlodipine, will restart if needed, continue Imdur, Avapro and Tenormin.  High cholesterol: Continue Lipitor, stop Niaspan

## 2016-05-16 ENCOUNTER — Ambulatory Visit (HOSPITAL_COMMUNITY): Payer: Medicare Other

## 2016-05-16 NOTE — Assessment & Plan Note (Signed)
URI, mild bronchitis: Supportive treatment, if not better will start amoxicillin. HTN: Started amlodipine ~ 01-2016, unable to get it for several weeks, BP remains normal, for now recommend to stop amlodipine, will restart if needed, continue Imdur, Avapro and Tenormin.  High cholesterol: Continue Lipitor, stop Niaspan

## 2016-05-22 ENCOUNTER — Ambulatory Visit (HOSPITAL_COMMUNITY): Payer: Medicare Other

## 2016-05-24 ENCOUNTER — Ambulatory Visit (HOSPITAL_COMMUNITY): Payer: Medicare Other

## 2016-05-26 ENCOUNTER — Other Ambulatory Visit (INDEPENDENT_AMBULATORY_CARE_PROVIDER_SITE_OTHER): Payer: Medicare Other

## 2016-05-26 ENCOUNTER — Ambulatory Visit (HOSPITAL_COMMUNITY): Payer: Medicare Other

## 2016-05-26 DIAGNOSIS — E1165 Type 2 diabetes mellitus with hyperglycemia: Secondary | ICD-10-CM

## 2016-05-26 DIAGNOSIS — Z794 Long term (current) use of insulin: Secondary | ICD-10-CM

## 2016-05-26 LAB — COMPREHENSIVE METABOLIC PANEL
ALT: 19 U/L (ref 0–35)
AST: 19 U/L (ref 0–37)
Albumin: 4.2 g/dL (ref 3.5–5.2)
Alkaline Phosphatase: 83 U/L (ref 39–117)
BUN: 20 mg/dL (ref 6–23)
CALCIUM: 9.9 mg/dL (ref 8.4–10.5)
CO2: 26 meq/L (ref 19–32)
Chloride: 105 mEq/L (ref 96–112)
Creatinine, Ser: 1.44 mg/dL — ABNORMAL HIGH (ref 0.40–1.20)
GFR: 37.54 mL/min — AB (ref >60.00)
Glucose, Bld: 148 mg/dL — ABNORMAL HIGH (ref 70–99)
Potassium: 4.3 mEq/L (ref 3.5–5.1)
Sodium: 140 mEq/L (ref 135–145)
Total Bilirubin: 0.5 mg/dL (ref 0.2–1.2)
Total Protein: 7.2 g/dL (ref 6.0–8.3)

## 2016-05-26 LAB — LIPID PANEL
CHOL/HDL RATIO: 3
Cholesterol: 104 mg/dL (ref 0–200)
HDL: 30.9 mg/dL — AB (ref >39.00)
NONHDL: 73.12
TRIGLYCERIDES: 257 mg/dL — AB (ref 0.0–149.0)
VLDL: 51.4 mg/dL — ABNORMAL HIGH (ref 0.0–40.0)

## 2016-05-26 LAB — LDL CHOLESTEROL, DIRECT: Direct LDL: 36 mg/dL

## 2016-05-26 LAB — HEMOGLOBIN A1C: Hgb A1c MFr Bld: 6.8 % — ABNORMAL HIGH (ref 4.6–6.5)

## 2016-05-30 ENCOUNTER — Encounter: Payer: Self-pay | Admitting: Endocrinology

## 2016-05-30 ENCOUNTER — Ambulatory Visit (INDEPENDENT_AMBULATORY_CARE_PROVIDER_SITE_OTHER): Payer: Medicare Other | Admitting: Endocrinology

## 2016-05-30 VITALS — BP 146/84 | HR 80 | Ht 66.0 in | Wt 165.0 lb

## 2016-05-30 DIAGNOSIS — I208 Other forms of angina pectoris: Secondary | ICD-10-CM

## 2016-05-30 DIAGNOSIS — N289 Disorder of kidney and ureter, unspecified: Secondary | ICD-10-CM | POA: Diagnosis not present

## 2016-05-30 DIAGNOSIS — E1165 Type 2 diabetes mellitus with hyperglycemia: Secondary | ICD-10-CM | POA: Diagnosis not present

## 2016-05-30 DIAGNOSIS — E782 Mixed hyperlipidemia: Secondary | ICD-10-CM | POA: Diagnosis not present

## 2016-05-30 DIAGNOSIS — I10 Essential (primary) hypertension: Secondary | ICD-10-CM | POA: Diagnosis not present

## 2016-05-30 DIAGNOSIS — Z794 Long term (current) use of insulin: Secondary | ICD-10-CM | POA: Diagnosis not present

## 2016-05-30 NOTE — Progress Notes (Signed)
Patient ID: [name], female   DOB: 1940/03/05, 76 y.o.   MRN: [id number]            Reason for Appointment: Followup for Type 2 Diabetes  Referring physician: Larose Kells  History of Present Illness:          Diagnosis: Type 2 diabetes mellitus, date of diagnosis: 1998       Past history:   She was treated with metformin at diagnosis when this had been continued until 2015 Subsequently Amaryl was also added several years ago and this has been continued Her blood sugars were under fair control between 2010 and early 2013 with A1c ranging from 7.4-9.4, mostly under 8% However since 08/2011 her A1c has been mostly over 8%  Insulin was added in 2014 with small doses of Lantus and this has been progressively increased She was started on mealtime insulin on her initial consultation in 9/15 because of high postprandial readings, sometimes over 300 With adding Victoza in 02/2014 her blood sugars were somewhat better with A1c coming down below 8%  Recent history:   INSULIN regimen: Regular 16 units before meals.  NPH 36 units in the morning and 20 hs  Non-insulin hypoglycemic drugs the patient is taking are:  Metformin 500 a.m., 1000 mg p.m. and Victoza 1.2 mg daily  A1c has been consistently under 7, now 6.8  Current blood sugar patterns, daily management and problems identified:  Fasting blood sugars: These are fluctuating again although mostly relatively high again, however today glucose was 83 fasting  Postprandial readings:  She does some readings in the afternoon but none after supper She has significant variability in the afternoon blood sugars depending on her diet which has recently been inconsistent and she is getting more liquids and until recently very little solid food because of decreased appetite The last 5 readings are about 170+  Insulin regimen:  She is usually taking regular insulin before meals as directed but not always 30 minutes before  Since she is eating  mostly 2 meals a day she is usually taking 2 injections of regular insulin  She has been variably active and much less in winter      HYPOGLYCEMIA: None including overnight, lowest blood sugar 70 4 in the afternoon  Glucose monitoring:  done 1-2 times a day         Glucometer: One Touch.      Blood Glucose readings by time of day from download:  Mean values apply above for all meters except median for One Touch  PRE-MEAL Fasting Lunch Dinner 7 PM-10PM  Overall  Glucose range: 83-219   74-240  90-216    Mean/median:     144+/-41     Self-care: The diet that the patient has been following is: none, usually eating low fat meals Meals: 2-3 meals per day at irregular times. Dinner 6-7 pm Breakfast is cereal but sometimes cheese toast or oatmeal, Recently yogurt, often eating sandwiches for meals, snacks will be with crackers          Dietician visit, most recent: never.  She saw the CDE in 02/2014              Exercise: none   Weight history: 150-170 previously  Wt Readings from Last 3 Encounters:  05/30/16 165 lb (74.8 kg)  05/15/16 173 lb 6 oz (78.6 kg)  04/19/16 177 lb (80.3 kg)    Glycemic control:   Lab Results  Component Value Date   HGBA1C  6.8 (H) 05/26/2016   HGBA1C 6.9 (H) 02/25/2016   HGBA1C 6.2 11/23/2015   Lab Results  Component Value Date   MICROALBUR 22.4 (H) 02/25/2016   LDLCALC 45 07/27/2015   CREATININE 1.44 (H) 05/26/2016     OTHER active problems  discussed in review of systems   Allergies as of 05/30/2016      Reactions   Codeine    REACTION: makes her nervous, orTylenol #3   Ibuprofen    REACTION: nervous   Meperidine Hcl    REACTION: nasuea and vomitting   Naproxen Sodium    REACTION: nervous      Medication List       Accurate as of 05/30/16  9:00 PM. Always use your most recent med list.          amoxicillin 500 MG capsule Commonly known as:  AMOXIL Take 2 capsules (1,000 mg total) by mouth 2 (two) times daily.   aspirin 81 MG  EC tablet Take 1 tablet (81 mg total) by mouth daily.   atenolol 100 MG tablet Commonly known as:  TENORMIN Take 1 tablet (100 mg total) by mouth daily.   atorvastatin 20 MG tablet Commonly known as:  LIPITOR TAKE 1 TABLET(20 MG) BY MOUTH DAILY   azelastine 0.1 % nasal spray Commonly known as:  ASTELIN Place 2 sprays into both nostrils at bedtime as needed for rhinitis. Use in each nostril as directed   cholecalciferol 1000 units tablet Commonly known as:  VITAMIN D Take 1,000 Units by mouth daily.   clopidogrel 75 MG tablet Commonly known as:  PLAVIX Take 1 tablet (75 mg total) by mouth daily.   clorazepate 7.5 MG tablet Commonly known as:  TRANXENE Take 1 tablet (7.5 mg total) by mouth as needed.   docusate sodium 100 MG capsule Commonly known as:  COLACE Take 200 mg by mouth daily as needed for mild constipation.   fluticasone 50 MCG/ACT nasal spray Commonly known as:  FLONASE Place 2 sprays into both nostrils daily as needed for allergies or rhinitis.   HYDROcodone-acetaminophen 5-325 MG tablet Commonly known as:  NORCO/VICODIN Take 1 tablet by mouth 3 (three) times daily as needed for moderate pain.   insulin NPH Human 100 UNIT/ML injection Commonly known as:  HUMULIN N,NOVOLIN N 20-36 Units 2 (two) times daily before a meal. Inject 36 units into the skin every morning and inject 20 units into the skin at bedtime.   insulin regular 100 units/mL injection Commonly known as:  NOVOLIN R,HUMULIN R Inject 16 Units into the skin 2 (two) times daily before a meal.   INSULIN SYRINGE .5CC/31GX5/16" 31G X 5/16" 0.5 ML Misc USE TO INJECT INSULIN 5 TIMES PER DAY   irbesartan 300 MG tablet Commonly known as:  AVAPRO TAKE 1 TABLET BY MOUTH DAILY   isosorbide mononitrate 30 MG 24 hr tablet Commonly known as:  IMDUR Take 1 tablet (30 mg total) by mouth daily.   metFORMIN 500 MG tablet Commonly known as:  GLUCOPHAGE TAKE 1 TABLET BY MOUTH EVERY MORNING AND 2 TABLETS  EVERY EVENING   mometasone 0.1 % cream Commonly known as:  ELOCON Use as directed   multivitamin capsule Take 1 capsule by mouth daily.   neomycin-polymyxin-hydrocortisone otic solution Commonly known as:  CORTISPORIN 2-3 drops in each ear three times daily   omeprazole 40 MG capsule Commonly known as:  PRILOSEC TAKE ONE CAPSULE BY MOUTH DAILY   ONE TOUCH ULTRA SYSTEM KIT w/Device Kit 1 kit by  Does not apply route once.   ONE TOUCH ULTRA TEST test strip Generic drug:  glucose blood USE TO CHECK BLOOD SUGARS TWICE DAILY AS DIRECTED   VICTOZA 18 MG/3ML Sopn Generic drug:  liraglutide ADMINISTER 1.2 MG UNDER THE SKIN DAILY       Allergies:  Allergies  Allergen Reactions  . Codeine     REACTION: makes her nervous, orTylenol #3  . Ibuprofen     REACTION: nervous  . Meperidine Hcl     REACTION: nasuea and vomitting  . Naproxen Sodium     REACTION: nervous    Past Medical History:  Diagnosis Date  . Acute blood loss anemia 09/17/2015  . Anemia   . Anginal pain ([place]) 2017  . Arthritis    "hands and knees mostly" (09/16/2015)  . B12 deficiency anemia   . CAD (coronary artery disease)    Dr Gwenlyn Found  . 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 Depression   . DJD (degenerative joint disease)   . 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 Gastritis   . GERD (gastroesophageal reflux disease)   . Headache(784.0)   . History of blood transfusion    "I"ve had 20 some; thru birth of children, loss of blood, last 2 were in ~ 1999 before my cancer surgery" (09/16/2015)  . History of hiatal hernia   . History of shingles   . Hx of colonic polyps   . Hyperlipidemia   . Hypertension   . 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 Pneumonia    X 1  . Postoperative hematoma involving circulatory system following cardiac catheterization 09/17/2015  . S/P cardiac cath 09/16/15 09/17/2015  . Type II diabetes mellitus ([place])   . Ulcerative colitis in  pediatric patient [facility])    as a child  . Venous insufficiency     Past Surgical History:  Procedure Laterality Date  . CARDIAC CATHETERIZATION  1997; 2008; 09/16/2015  . CARDIAC CATHETERIZATION N/A 09/16/2015   Procedure: Right/Left Heart Cath and Coronary/Graft Angiography;  Surgeon: Lorretta Harp, MD;  Location: [place] [facility];  Service: Cardiovascular;  Laterality: N/A;  . CESAREAN SECTION  1966  . CORONARY ARTERY BYPASS GRAFT  1997   CABG X5  . EYE SURGERY     2 laser surgeries on left eye with implant  . EYE SURGERY Bilateral    cataracts  . FRACTURE SURGERY    . HERNIA REPAIR    . LAPAROSCOPIC ASSISTED VENTRAL HERNIA REPAIR  09/2008    with incarcerated colon;  Dr. Lucia Gaskins  . PATELLA FRACTURE SURGERY Left 11/1994   "crushed my knee"; put in a plate & 6 screws"  . REFRACTIVE SURGERY Left 07/30/2015  . 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 March Rummage  . SHOULDER ARTHROSCOPY W/ ROTATOR CUFF REPAIR Right 1997  . TONSILLECTOMY  1960s  . TOTAL KNEE ARTHROPLASTY WITH HARDWARE REMOVAL Left 12/1994    (infex, hardware removed )  . TUBAL LIGATION  1966    Family History  Problem Relation Age of Onset  . Heart disease Mother 34  . Heart disease Father 61       MI  . Hypertension Child   . Heart attack Child   . Heart disease Maternal Aunt        x 2, all deceased  . Heart disease Maternal Uncle        x 4, all deceased  . Emphysema Brother 50  .  Colon cancer Neg Hx   . Breast cancer Neg Hx     Social History:  reports that she has never smoked. She has never used smokeless tobacco. She reports that she does not drink alcohol or use drugs.    Review of Systems    She recently had upper as per day infection including sinusitis and laryngitis and lost significant amount of weight because of decreased appetite, is improving      Lipids: Has had excellent control of hypercholesterolemia with long-term use of Crestor She has a history of coronary bypass  surgery.   Also has been  on Niaspan, Prescribed by PCP       Lab Results  Component Value Date   CHOL 104 05/26/2016   HDL 30.90 (L) 05/26/2016   LDLCALC 45 07/27/2015   LDLDIRECT 36.0 05/26/2016   TRIG 257.0 (H) 05/26/2016   CHOLHDL 3 05/26/2016                  The blood pressure has been  well controlled and she on 368m Avapro and atenolol.  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 Is upper normal usually This is Slightly higher and she is asking about what chronic kidney disease stage III means and this was explained in detail    Lab Results  Component Value Date   CREATININE 1.44 (H) 05/26/2016   CREATININE 1.38 (H) 02/25/2016   CREATININE 1.30 (H) 11/23/2015       No history of Numbness, tingling or burning in feet   Diabetic foot exam Done in 5/18    Physical Examination:  BP (!) 146/84   Pulse 80   Ht _0  (1.676 m)   Wt 165 lb (74.8 kg)   SpO2 97%   BMI 26.63 kg/m   No edema present  Diabetic Foot Exam - Simple   Simple Foot Form Diabetic Foot exam was performed with the following findings:  Yes 05/30/2016  9:16 AM  Visual Inspection No deformities, no ulcerations, no other skin breakdown bilaterally:  Yes See comments:  Yes Sensation Testing Intact to touch and monofilament testing bilaterally:  Yes See comments:  Yes Pulse Check Posterior Tibialis and Dorsalis pulse intact bilaterally:  Yes Comments Bunions bilaterally, minimal redness on the left Mild inconsistent decrease in distal monofilament sensation      Diabetes type 2, uncontrolled with BMI 27   See history of present illness for  detailed discussion of her current blood sugar patterns, management and problems identified  Considering her age, duration of diabetes and use of generic insulin her blood sugar control is fairly good She has overall high readings in the mornings and more recently higher readings at suppertime also Most of this is related to acute illness, use of more high glycemic index liquids rather than balanced meals and blood sugars are overall variable but no consistent trends She is again not checking readings after evening meal She is also on metformin and Victoza  Exam shows mild NEUROPATHY  RENAL insufficiency: Her creatinine is slightly higher, likely to be  from acute illness and some dehydration  HYPERTENSION: Blood pressure is higher today, followed by PCP   Edema better with her weight loss and not taking amlodipine  PLAN:   No change in insulin doses at this time  Discussed that we will need to adjust her suppertime dose when she has more readings after supper and discussed postprandial sugars targets at that time  Again reminded her to take insulin 30 minutes before eating and the evening NPH at bedtime  Discussed balanced meals with some protein at every meal  Victoza to half dosage until her appetite is improving  May not need to change metformin with current renal function  She needs to check her feet regularly  Follow-up with PCP for management of hypertension, Meanwhile needs to start checking blood pressure regularly at home and report readings to PCP if needed, may need to start back on amlodipine  Patient Instructions  Check blood sugars on waking up  3x weekly  Also check blood sugars about 2 hours after a meal and do this after different meals by rotation  Recommended blood sugar levels on waking up is 90-130 and about 2 hours after meal is 130-160  Please bring your blood sugar monitor to each visit, thank you  Reduce Victoza to 0.48m till appetite back then 1.2  when eating better   Check BP 3/7 days    Currently face-to-face time for evaluation and management of multiple problems, physical exam,, counseling, discussion of with patient about her chronic condition, review of labs and records = 25 minutes  [name] 05/30/2016, 9:00 PM   Note: This office note was prepared with [employer] agent Any transcriptional errors that result from this process are unintentional.

## 2016-05-30 NOTE — Patient Instructions (Addendum)
Check blood sugars on waking up  3x weekly  Also check blood sugars about 2 hours after a meal and do this after different meals by rotation  Recommended blood sugar levels on waking up is 90-130 and about 2 hours after meal is 130-160  Please bring your blood sugar monitor to each visit, thank you  Reduce Victoza to 0.6mg  till appetite back then 1.2 when eating better   Check BP 3/7 days

## 2016-05-31 ENCOUNTER — Ambulatory Visit (HOSPITAL_COMMUNITY): Payer: Medicare Other

## 2016-06-02 ENCOUNTER — Ambulatory Visit (HOSPITAL_COMMUNITY): Payer: Medicare Other

## 2016-06-02 HISTORY — PX: MOHS SURGERY: SUR867

## 2016-06-05 ENCOUNTER — Ambulatory Visit (HOSPITAL_COMMUNITY): Payer: Medicare Other

## 2016-06-06 ENCOUNTER — Other Ambulatory Visit: Payer: Self-pay | Admitting: Endocrinology

## 2016-06-07 ENCOUNTER — Ambulatory Visit (HOSPITAL_COMMUNITY): Payer: Medicare Other

## 2016-06-09 ENCOUNTER — Ambulatory Visit (HOSPITAL_COMMUNITY): Payer: Medicare Other

## 2016-06-11 ENCOUNTER — Other Ambulatory Visit: Payer: Self-pay | Admitting: Internal Medicine

## 2016-06-12 ENCOUNTER — Other Ambulatory Visit: Payer: Self-pay | Admitting: Cardiovascular Disease

## 2016-06-12 ENCOUNTER — Other Ambulatory Visit: Payer: Self-pay | Admitting: Endocrinology

## 2016-06-12 ENCOUNTER — Other Ambulatory Visit: Payer: Self-pay | Admitting: Gastroenterology

## 2016-06-12 ENCOUNTER — Ambulatory Visit (HOSPITAL_COMMUNITY): Payer: Medicare Other

## 2016-06-13 DIAGNOSIS — Q141 Congenital malformation of retina: Secondary | ICD-10-CM | POA: Diagnosis not present

## 2016-06-13 DIAGNOSIS — H43813 Vitreous degeneration, bilateral: Secondary | ICD-10-CM | POA: Diagnosis not present

## 2016-06-13 DIAGNOSIS — E113291 Type 2 diabetes mellitus with mild nonproliferative diabetic retinopathy without macular edema, right eye: Secondary | ICD-10-CM | POA: Diagnosis not present

## 2016-06-13 DIAGNOSIS — E113212 Type 2 diabetes mellitus with mild nonproliferative diabetic retinopathy with macular edema, left eye: Secondary | ICD-10-CM | POA: Diagnosis not present

## 2016-06-14 ENCOUNTER — Ambulatory Visit (HOSPITAL_COMMUNITY): Payer: Medicare Other

## 2016-06-14 DIAGNOSIS — C44311 Basal cell carcinoma of skin of nose: Secondary | ICD-10-CM | POA: Diagnosis not present

## 2016-06-14 DIAGNOSIS — D489 Neoplasm of uncertain behavior, unspecified: Secondary | ICD-10-CM | POA: Diagnosis not present

## 2016-06-16 ENCOUNTER — Ambulatory Visit (HOSPITAL_COMMUNITY): Payer: Medicare Other

## 2016-06-19 ENCOUNTER — Ambulatory Visit (HOSPITAL_COMMUNITY): Payer: Medicare Other

## 2016-06-21 ENCOUNTER — Ambulatory Visit (HOSPITAL_COMMUNITY): Payer: Medicare Other

## 2016-06-23 ENCOUNTER — Ambulatory Visit (HOSPITAL_COMMUNITY): Payer: Medicare Other

## 2016-06-26 ENCOUNTER — Ambulatory Visit (HOSPITAL_COMMUNITY): Payer: Medicare Other

## 2016-06-28 ENCOUNTER — Ambulatory Visit (HOSPITAL_COMMUNITY): Payer: Medicare Other

## 2016-06-30 ENCOUNTER — Ambulatory Visit (HOSPITAL_COMMUNITY): Payer: Medicare Other

## 2016-07-03 ENCOUNTER — Ambulatory Visit (HOSPITAL_COMMUNITY): Payer: Medicare Other

## 2016-07-07 ENCOUNTER — Ambulatory Visit (HOSPITAL_COMMUNITY): Payer: Medicare Other

## 2016-07-10 ENCOUNTER — Ambulatory Visit (HOSPITAL_COMMUNITY): Payer: Medicare Other

## 2016-07-12 ENCOUNTER — Ambulatory Visit (HOSPITAL_COMMUNITY): Payer: Medicare Other

## 2016-07-12 DIAGNOSIS — E113212 Type 2 diabetes mellitus with mild nonproliferative diabetic retinopathy with macular edema, left eye: Secondary | ICD-10-CM | POA: Diagnosis not present

## 2016-07-14 ENCOUNTER — Ambulatory Visit (HOSPITAL_COMMUNITY): Payer: Medicare Other

## 2016-07-17 ENCOUNTER — Ambulatory Visit (HOSPITAL_COMMUNITY): Payer: Medicare Other

## 2016-07-19 ENCOUNTER — Ambulatory Visit (HOSPITAL_COMMUNITY): Payer: Medicare Other

## 2016-07-21 ENCOUNTER — Ambulatory Visit (HOSPITAL_COMMUNITY): Payer: Medicare Other

## 2016-07-24 ENCOUNTER — Ambulatory Visit (HOSPITAL_COMMUNITY): Payer: Medicare Other

## 2016-07-26 ENCOUNTER — Ambulatory Visit (HOSPITAL_COMMUNITY): Payer: Medicare Other

## 2016-07-28 ENCOUNTER — Ambulatory Visit (HOSPITAL_COMMUNITY): Payer: Medicare Other

## 2016-07-31 ENCOUNTER — Ambulatory Visit (HOSPITAL_COMMUNITY): Payer: Medicare Other

## 2016-08-02 ENCOUNTER — Ambulatory Visit (HOSPITAL_COMMUNITY): Payer: Medicare Other

## 2016-08-03 ENCOUNTER — Ambulatory Visit (INDEPENDENT_AMBULATORY_CARE_PROVIDER_SITE_OTHER): Payer: Medicare Other | Admitting: Internal Medicine

## 2016-08-03 ENCOUNTER — Encounter: Payer: Self-pay | Admitting: Internal Medicine

## 2016-08-03 VITALS — BP 134/72 | HR 81 | Temp 98.4°F | Resp 14 | Ht 66.0 in | Wt 171.1 lb

## 2016-08-03 DIAGNOSIS — I10 Essential (primary) hypertension: Secondary | ICD-10-CM

## 2016-08-03 DIAGNOSIS — C9110 Chronic lymphocytic leukemia of B-cell type not having achieved remission: Secondary | ICD-10-CM

## 2016-08-03 DIAGNOSIS — E785 Hyperlipidemia, unspecified: Secondary | ICD-10-CM | POA: Diagnosis not present

## 2016-08-03 DIAGNOSIS — I208 Other forms of angina pectoris: Secondary | ICD-10-CM | POA: Diagnosis not present

## 2016-08-03 DIAGNOSIS — C9190 Lymphoid leukemia, unspecified not having achieved remission: Secondary | ICD-10-CM | POA: Diagnosis not present

## 2016-08-03 DIAGNOSIS — M150 Primary generalized (osteo)arthritis: Secondary | ICD-10-CM | POA: Diagnosis not present

## 2016-08-03 DIAGNOSIS — M159 Polyosteoarthritis, unspecified: Secondary | ICD-10-CM

## 2016-08-03 MED ORDER — HYDROCODONE-ACETAMINOPHEN 5-325 MG PO TABS: 1.0000 | ORAL_TABLET | Freq: Three times a day (TID) | ORAL | 0 refills | Status: DC | PRN

## 2016-08-03 NOTE — Assessment & Plan Note (Signed)
HTN: Recent BMP satisfactory, continue Tenormin, Avapro. Hyperlipidemia: On Lipitor, last FLP satisfactory DJD: Refill hydrocodone. CLL: Reports she will see Dr. Marin Olp. Denies night sweats or weight loss. BCC: Had moh's surgery a few weeks ago, doing well. Allergies: Reports "behind the eyes headache" associated with postnasal dripping. Recommend to use Astelin and Flonase consistently, call if no better. Exam is benign. RTC 4-5 months.

## 2016-08-03 NOTE — Progress Notes (Signed)
Pre visit review using our clinic review tool, if applicable. No additional management support is needed unless otherwise documented below in the visit note. 

## 2016-08-03 NOTE — Assessment & Plan Note (Signed)
Bone density test on 12/2015, T score -0.8. Elected conservative treatment. Mammogram done 11-2015

## 2016-08-03 NOTE — Progress Notes (Signed)
Subjective:    Patient ID: [name], female    DOB: [birth year], 76 y.o.   MRN: [id number]  DOS:  08/03/2016 Type of visit - description : rov Interval history: In general feeling well. 2 days ago developed some headache located behind the eyes associated with postnasal dripping. She feels is allergies. Symptoms overall  are better. HTN: Good compliance with medication, BPs at home reportedly satisfactory. Saw endocrinology, last A1c very good.   Review of Systems Denies fever chills No nasal discharge Denies weight loss or night sweats  Past Medical History:  Diagnosis Date  . Acute blood loss anemia 09/17/2015  . Anemia   . Anginal pain ([place]) 2017  . Arthritis    "hands and knees mostly" (09/16/2015)  . B12 deficiency anemia   . CAD (coronary artery disease)    Dr Gwenlyn Found  . CLL (chronic lymphocytic leukemia) ([place]) 02/25/2014  . Depression   . DJD (degenerative joint disease)   . 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 Gastritis   . GERD (gastroesophageal reflux disease)   . Headache(784.0)   . History of blood transfusion    "I"ve had 20 some; thru birth of children, loss of blood, last 2 were in ~ 1999 before my cancer surgery" (09/16/2015)  . History of hiatal hernia   . History of shingles   . Hx of colonic polyps   . Hyperlipidemia   . Hypertension   . 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 Pneumonia    X 1  . Postoperative hematoma involving circulatory system following cardiac catheterization 09/17/2015  . S/P cardiac cath 09/16/15 09/17/2015  . Type II diabetes mellitus ([place])   . 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[facility])    as a child  . Venous insufficiency     Past Surgical History:  Procedure Laterality Date  . CARDIAC CATHETERIZATION  1997; 2008; 09/16/2015  . CARDIAC CATHETERIZATION N/A 09/16/2015   Procedure: Right/Left Heart Cath and Coronary/Graft Angiography;  Surgeon: Lorretta Harp, MD;  Location: [place] [facility];  Service: Cardiovascular;  Laterality: N/A;  . CESAREAN SECTION  1966  . CORONARY ARTERY BYPASS GRAFT  1997   CABG X5  . EYE SURGERY     2 laser surgeries on left eye with implant  . EYE SURGERY Bilateral    cataracts  . FRACTURE SURGERY    . HERNIA REPAIR    . LAPAROSCOPIC ASSISTED VENTRAL HERNIA REPAIR  09/2008    with incarcerated colon;  Dr. Lucia Gaskins  . MOHS SURGERY  06/2016   BCC  . PATELLA FRACTURE SURGERY Left 11/1994   "crushed my knee"; put in a plate & 6 screws"  . REFRACTIVE SURGERY Left 07/30/2015  . 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 March Rummage  . SHOULDER ARTHROSCOPY W/ ROTATOR CUFF REPAIR Right 1997  . TONSILLECTOMY  1960s  . TOTAL KNEE ARTHROPLASTY WITH HARDWARE REMOVAL Left 12/1994    (infex, hardware removed )  . TUBAL LIGATION  1966    Social History   Social History  . Marital status: Widowed    Spouse name: N/A  . Number of children: 2  . Years of education: N/A   Occupational History  . retired-- [employer]    .  Retired   Social History Main Topics  . Smoking status: Never Smoker  . Smokeless tobacco: Never Used  . Alcohol use No  . Drug use: No  . Sexual activity: No  Other Topics Concern  . Not on file   Social History Narrative   Lives by herself, lost husband ~ 2004       Allergies as of 08/03/2016      Reactions   Codeine    REACTION: makes her nervous, orTylenol #3   Ibuprofen    REACTION: nervous   Meperidine Hcl    REACTION: nasuea and vomitting   Naproxen Sodium    REACTION: nervous      Medication List       Accurate as of 08/03/16  8:37 PM. Always use your most recent med list.          aspirin 81 MG EC tablet Take 1 tablet (81 mg total) by mouth daily.   atenolol 100 MG tablet Commonly known as:  TENORMIN Take 1 tablet (100 mg total) by mouth daily.   atorvastatin 20 MG tablet Commonly known as:  LIPITOR TAKE 1 TABLET(20 MG) BY MOUTH DAILY   azelastine 0.1 % nasal spray Commonly known as:   ASTELIN Place 2 sprays into both nostrils at bedtime as needed for rhinitis. Use in each nostril as directed   cholecalciferol 1000 units tablet Commonly known as:  VITAMIN D Take 1,000 Units by mouth daily.   clopidogrel 75 MG tablet Commonly known as:  PLAVIX Take 1 tablet (75 mg total) by mouth daily.   clorazepate 7.5 MG tablet Commonly known as:  TRANXENE Take 1 tablet (7.5 mg total) by mouth as needed.   docusate sodium 100 MG capsule Commonly known as:  COLACE Take 200 mg by mouth daily as needed for mild constipation.   fluticasone 50 MCG/ACT nasal spray Commonly known as:  FLONASE Place 2 sprays into both nostrils daily as needed for allergies or rhinitis.   HYDROcodone-acetaminophen 5-325 MG tablet Commonly known as:  NORCO/VICODIN Take 1 tablet by mouth 3 (three) times daily as needed for moderate pain.   insulin NPH Human 100 UNIT/ML injection Commonly known as:  HUMULIN N,NOVOLIN N 20-36 Units 2 (two) times daily before a meal. Inject 36 units into the skin every morning and inject 20 units into the skin at bedtime.   insulin regular 100 units/mL injection Commonly known as:  NOVOLIN R,HUMULIN R Inject 16 Units into the skin 2 (two) times daily before a meal.   INSULIN SYRINGE .5CC/31GX5/16" 31G X 5/16" 0.5 ML Misc USE TO INJECT INSULIN 5 TIMES PER DAY   irbesartan 300 MG tablet Commonly known as:  AVAPRO TAKE 1 TABLET BY MOUTH DAILY   isosorbide mononitrate 30 MG 24 hr tablet Commonly known as:  IMDUR Take 1 tablet (30 mg total) by mouth daily.   metFORMIN 500 MG tablet Commonly known as:  GLUCOPHAGE TAKE 1 TABLET BY MOUTH EVERY MORNING AND 2 TABLETS EVERY EVENING   mometasone 0.1 % cream Commonly known as:  ELOCON Use as directed   multivitamin capsule Take 1 capsule by mouth daily.   neomycin-polymyxin-hydrocortisone OTIC solution Commonly known as:  CORTISPORIN 2-3 drops in each ear three times daily   omeprazole 40 MG capsule Commonly  known as:  PRILOSEC TAKE 1 CAPSULE BY MOUTH DAILY   ONE TOUCH ULTRA SYSTEM KIT w/Device Kit 1 kit by Does not apply route once.   ONE TOUCH ULTRA TEST test strip Generic drug:  glucose blood USE TO CHECK BLOOD SUGARS TWICE DAILY AS DIRECTED   VICTOZA 18 MG/3ML Sopn Generic drug:  liraglutide ADMINISTER 1.2 MG UNDER THE SKIN DAILY  Objective:   Physical Exam BP 134/72 (BP Location: Left Arm, Patient Position: Sitting, Cuff Size: Small)   Pulse 81   Temp 98.4 F (36.9 C) (Oral)   Resp 14   Ht 5' 6"  (1.676 m)   Wt 171 lb 2 oz (77.6 kg)   SpO2 98%   BMI 27.62 kg/m  General:   Well developed, well nourished . NAD.  HEENT:  Normocephalic . Face symmetric, atraumatic. Nose not congested. Sinuses no TTP. Lungs:  CTA B Normal respiratory effort, no intercostal retractions, no accessory muscle use. Heart: RRR,  no murmur.  No pretibial edema bilaterally  Skin: Not pale. Not jaundice Neurologic:  alert & oriented X3.  Speech normal, gait appropriate for age and unassisted Psych--  Cognition and judgment appear intact.  Cooperative with normal attention span and concentration.  Behavior appropriate. No anxious or depressed appearing.      Assessment & Plan:  Assessment DM  Dr Dwyane Dee  +retinopathy  HTN Hyperlipidemia CRI creat ~1.4  (GFR ~ 38) Depression/anxiety: tranxene qhs prn (takes rarely) DJD -- hydrocodone (rx pcp) Chronic lymphocytic leukemia-2016, Dr Marin Olp H/o anemia  B12 def CAD, Dr Gwenlyn Found MI 97 >> CABG, cath 12-13-2006, myoview 2012 no ischemic CP: Stress test 08/05/2015, indeterminate risk study, + lateral ischemia: Cardiac catheterization 09/16/2015: Rx medical Venous insuff GI:  Dr Silverio Decamp ---GERD, IBS, h/o Gastritis ---H/o ulcerative colitis as a child H/o shingles  BCC Dr Allyson Sabal, bx 04-2015, MOH's 06-2016   PLAN HTN: Recent BMP satisfactory, continue Tenormin, Avapro. Hyperlipidemia: On Lipitor, last FLP satisfactory DJD: Refill  hydrocodone. CLL: Reports she will see Dr. Marin Olp. Denies night sweats or weight loss. BCC: Had moh's surgery a few weeks ago, doing well. Allergies: Reports "behind the eyes headache" associated with postnasal dripping. Recommend to use Astelin and Flonase consistently, call if no better. Exam is benign. RTC 4-5 months.

## 2016-08-03 NOTE — Patient Instructions (Signed)
Next visit in 4-5 months, please make an appointment.   For your allergis:  Use Flonase and Astelin every day until you feel better.  Okay to use Mucinex DM if you develop some cough.  Call me if you are not gradually improving  Call if he has severe symptoms, fever, chills.

## 2016-08-04 ENCOUNTER — Ambulatory Visit (HOSPITAL_COMMUNITY): Payer: Medicare Other

## 2016-08-04 ENCOUNTER — Ambulatory Visit: Payer: Medicare Other | Admitting: Internal Medicine

## 2016-08-07 ENCOUNTER — Ambulatory Visit (HOSPITAL_COMMUNITY): Payer: Medicare Other

## 2016-08-09 ENCOUNTER — Ambulatory Visit (HOSPITAL_COMMUNITY): Payer: Medicare Other

## 2016-08-11 ENCOUNTER — Ambulatory Visit (HOSPITAL_COMMUNITY): Payer: Medicare Other

## 2016-08-14 ENCOUNTER — Ambulatory Visit (HOSPITAL_COMMUNITY): Payer: Medicare Other

## 2016-08-16 ENCOUNTER — Ambulatory Visit (HOSPITAL_COMMUNITY): Payer: Medicare Other

## 2016-08-18 ENCOUNTER — Ambulatory Visit (HOSPITAL_COMMUNITY): Payer: Medicare Other

## 2016-08-21 ENCOUNTER — Ambulatory Visit (HOSPITAL_COMMUNITY): Payer: Medicare Other

## 2016-08-23 ENCOUNTER — Ambulatory Visit (HOSPITAL_COMMUNITY): Payer: Medicare Other

## 2016-08-25 ENCOUNTER — Other Ambulatory Visit: Payer: Medicare Other

## 2016-08-28 ENCOUNTER — Other Ambulatory Visit: Payer: Self-pay | Admitting: Cardiovascular Disease

## 2016-08-30 ENCOUNTER — Ambulatory Visit: Payer: Medicare Other

## 2016-08-30 ENCOUNTER — Other Ambulatory Visit (HOSPITAL_BASED_OUTPATIENT_CLINIC_OR_DEPARTMENT_OTHER): Payer: Medicare Other

## 2016-08-30 ENCOUNTER — Telehealth: Payer: Self-pay | Admitting: *Deleted

## 2016-08-30 ENCOUNTER — Other Ambulatory Visit: Payer: Self-pay | Admitting: Family

## 2016-08-30 ENCOUNTER — Ambulatory Visit (HOSPITAL_BASED_OUTPATIENT_CLINIC_OR_DEPARTMENT_OTHER): Payer: Medicare Other | Admitting: Family

## 2016-08-30 ENCOUNTER — Ambulatory Visit: Payer: Medicare Other | Admitting: Endocrinology

## 2016-08-30 ENCOUNTER — Other Ambulatory Visit: Payer: Self-pay | Admitting: *Deleted

## 2016-08-30 VITALS — BP 178/81 | HR 74 | Temp 98.1°F | Resp 16 | Wt 172.0 lb

## 2016-08-30 DIAGNOSIS — Z862 Personal history of diseases of the blood and blood-forming organs and certain disorders involving the immune mechanism: Secondary | ICD-10-CM

## 2016-08-30 DIAGNOSIS — D509 Iron deficiency anemia, unspecified: Secondary | ICD-10-CM | POA: Insufficient documentation

## 2016-08-30 DIAGNOSIS — D508 Other iron deficiency anemias: Secondary | ICD-10-CM

## 2016-08-30 DIAGNOSIS — C9110 Chronic lymphocytic leukemia of B-cell type not having achieved remission: Secondary | ICD-10-CM | POA: Diagnosis not present

## 2016-08-30 LAB — COMPREHENSIVE METABOLIC PANEL
ALT: 20 U/L (ref 0–55)
AST: 23 U/L (ref 5–34)
Albumin: 3.6 g/dL (ref 3.5–5.0)
Alkaline Phosphatase: 113 U/L (ref 40–150)
Anion Gap: 8 mEq/L (ref 3–11)
BUN: 20.5 mg/dL (ref 7.0–26.0)
CALCIUM: 9.8 mg/dL (ref 8.4–10.4)
CHLORIDE: 110 meq/L — AB (ref 98–109)
CO2: 24 mEq/L (ref 22–29)
CREATININE: 1.4 mg/dL — AB (ref 0.6–1.1)
EGFR: 38 mL/min/{1.73_m2} — ABNORMAL LOW (ref >90)
GLUCOSE: 117 mg/dL (ref 70–140)
Potassium: 4.5 mEq/L (ref 3.5–5.1)
SODIUM: 143 meq/L (ref 136–145)
Total Bilirubin: 0.38 mg/dL (ref 0.20–1.20)
Total Protein: 6.9 g/dL (ref 6.4–8.3)

## 2016-08-30 LAB — CBC WITH DIFFERENTIAL (CANCER CENTER ONLY)
HCT: 34.4 % — ABNORMAL LOW (ref 34.8–46.6)
HGB: 11.3 g/dL — ABNORMAL LOW (ref 11.6–15.9)
MCH: 31.1 pg (ref 26.0–34.0)
MCHC: 32.8 g/dL (ref 32.0–36.0)
MCV: 95 fL (ref 81–101)
PLATELETS: 195 10*3/uL (ref 145–400)
RBC: 3.63 10*6/uL — AB (ref 3.70–5.32)
RDW: 14.1 % (ref 11.1–15.7)
WBC: 25.5 10*3/uL — AB (ref 3.9–10.0)

## 2016-08-30 LAB — MANUAL DIFFERENTIAL (CHCC SATELLITE)
ALC: 18.6 10*3/uL — ABNORMAL HIGH (ref 0.6–2.2)
ANC (CHCC MAN DIFF): 6.1 10*3/uL (ref 1.5–6.7)
EOS: 1 % (ref 0–7)
LYMPH: 73 % — ABNORMAL HIGH (ref 14–48)
MONO: 2 % (ref 0–13)
PLT EST ~~LOC~~: ADEQUATE
RBC COMMENTS: NORMAL
SEG: 24 % — ABNORMAL LOW (ref 40–75)

## 2016-08-30 LAB — FERRITIN: FERRITIN: 18 ng/mL (ref 9–269)

## 2016-08-30 LAB — CHCC SATELLITE - SMEAR

## 2016-08-30 LAB — IRON AND TIBC
%SAT: 14 % — AB (ref 21–57)
Iron: 51 ug/dL (ref 41–142)
TIBC: 358 ug/dL (ref 236–444)
UIBC: 306 ug/dL (ref 120–384)

## 2016-08-30 NOTE — Progress Notes (Signed)
Hematology and Oncology Follow Up Visit  [place] [id number] Jun 27, 1940 76 y.o. 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 She is doing well but having some intermittent fatigue and occasional SOB. She states that has a child and young woman she had history of iron deficiency anemia, so those lab have been added today. She states that she required multiple blood transfusions during her youth.  Her WBC count is stable at 25.5. Hgb 11.3 with an MCV of 95. We will see what her iron studies show.  No episodes of bleeding or bruising. No lymphadenopathy found on exam.  She has had a few episodes of chest pain and cardiology has increased her Atenolol and added another medication as well. She follows up with them again on October 19th.  She denies fever, chills, n/v, cough, rash, dizziness, headache, vision changes, palpitations, abdominal pain or changes in bowel or bladder habits.  No numbness or tingling in her extremities. 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 She receives injections periodically.  No c/o numbness and tingling.  She has maintained a good appetite and is staying hydrated. Her weight is stable.   ECOG Performance Status: 1 - Symptomatic but completely ambulatory  Medications:  Allergies as of 08/30/2016      Reactions   Codeine    REACTION: makes her nervous, orTylenol #3   Ibuprofen    REACTION: nervous   Meperidine Hcl    REACTION: nasuea and vomitting   Naproxen Sodium    REACTION: nervous      Medication List       Accurate as of 08/30/16  1:14 PM. Always use your most recent med list.          aspirin 81 MG EC tablet Take 1 tablet (81 mg total) by mouth daily.   atenolol 100 MG tablet Commonly known as:  TENORMIN Take 1 tablet (100 mg total)  by mouth daily.   atorvastatin 20 MG tablet Commonly known as:  LIPITOR TAKE 1 TABLET(20 MG) BY MOUTH DAILY   azelastine 0.1 % nasal spray Commonly known as:  ASTELIN Place 2 sprays into both nostrils at bedtime as needed for rhinitis. Use in each nostril as directed   cholecalciferol 1000 units tablet Commonly known as:  VITAMIN D Take 1,000 Units by mouth daily.   clopidogrel 75 MG tablet Commonly known as:  PLAVIX Take 1 tablet (75 mg total) by mouth daily.   clorazepate 7.5 MG tablet Commonly known as:  TRANXENE Take 1 tablet (7.5 mg total) by mouth as needed.   docusate sodium 100 MG capsule Commonly known as:  COLACE Take 200 mg by mouth daily as needed for mild constipation.   fluticasone 50 MCG/ACT nasal spray Commonly known as:  FLONASE Place 2 sprays into both nostrils daily as needed for allergies or rhinitis.   HYDROcodone-acetaminophen 5-325 MG tablet Commonly known as:  NORCO/VICODIN Take 1 tablet by mouth 3 (three) times daily as needed for moderate pain.   insulin NPH Human 100 UNIT/ML injection Commonly known as:  HUMULIN N,NOVOLIN N 20-36 Units 2 (two) times daily before a meal. Inject 36 units into the skin every morning and inject 20 units into the skin at bedtime.   insulin regular 100 units/mL injection Commonly known as:  NOVOLIN R,HUMULIN  R Inject 16 Units into the skin 2 (two) times daily before a meal.   INSULIN SYRINGE .5CC/31GX5/16" 31G X 5/16" 0.5 ML Misc USE TO INJECT INSULIN 5 TIMES PER DAY   irbesartan 300 MG tablet Commonly known as:  AVAPRO TAKE 1 TABLET BY MOUTH DAILY   isosorbide mononitrate 30 MG 24 hr tablet Commonly known as:  IMDUR Take 1 tablet (30 mg total) by mouth daily.   metFORMIN 500 MG tablet Commonly known as:  GLUCOPHAGE TAKE 1 TABLET BY MOUTH EVERY MORNING AND 2 TABLETS EVERY EVENING   mometasone 0.1 % cream Commonly known as:  ELOCON Use as directed   multivitamin capsule Take 1 capsule by mouth daily.    neomycin-polymyxin-hydrocortisone OTIC solution Commonly known as:  CORTISPORIN 2-3 drops in each ear three times daily   omeprazole 40 MG capsule Commonly known as:  PRILOSEC TAKE 1 CAPSULE BY MOUTH DAILY   ONE TOUCH ULTRA SYSTEM KIT w/Device Kit 1 kit by Does not apply route once.   ONE TOUCH ULTRA TEST test strip Generic drug:  glucose blood USE TO CHECK BLOOD SUGARS TWICE DAILY AS DIRECTED   VICTOZA 18 MG/3ML Sopn Generic drug:  liraglutide ADMINISTER 1.2 MG UNDER THE SKIN DAILY            Discharge Care Instructions        Start     Ordered   08/30/16 0000  Ferritin     08/30/16 1052   08/30/16 0000  Iron and TIBC     08/30/16 1052      Allergies:  Allergies  Allergen Reactions  . Codeine     REACTION: makes her nervous, orTylenol #3  . Ibuprofen     REACTION: nervous  . Meperidine Hcl     REACTION: nasuea and vomitting  . Naproxen Sodium     REACTION: nervous    Past Medical History, Surgical history, Social history, and Family History were reviewed and updated.  Review of Systems: All other 10 point review of systems is negative.   Physical Exam:  weight is 172 lb (78 kg). Her oral temperature is 98.1 F (36.7 C). Her blood pressure is 178/81 (abnormal) and her pulse is 74. Her respiration is 16 and oxygen saturation is 97%.   Wt Readings from Last 3 Encounters:  08/30/16 172 lb (78 kg)  08/03/16 171 lb 2 oz (77.6 kg)  05/30/16 165 lb (74.8 kg)    Ocular: Sclerae unicteric, pupils equal, round and reactive to light Ear-nose-throat: Oropharynx clear, dentition fair Lymphatic: No cervical, supraclavicular or axillary adenopathy Lungs no rales or rhonchi, good excursion bilaterally Heart regular rate and rhythm, no murmur appreciated Abd soft, nontender, positive bowel sounds, no liver or spleen tip palpated on exam, no fluid wave  MSK no focal spinal tenderness, no joint edema Neuro: non-focal, well-oriented, appropriate affect Breasts:  Deferred   Lab Results  Component Value Date   WBC 25.5 (H) 08/30/2016   HGB 11.3 (L) 08/30/2016   HCT 34.4 (L) 08/30/2016   MCV 95 08/30/2016   PLT 195 08/30/2016   Lab Results  Component Value Date   FERRITIN 30.4 09/01/2008   IRON 93 05/19/2009   IRONPCTSAT 23.9 05/19/2009   Lab Results  Component Value Date   RBC 3.63 (L) 08/30/2016   No results found for: Nils Pyle, [facility] Lab Results  Component Value Date   IGGSERUM 1,030 02/25/2014   IGA 281 02/25/2014   IGMSERUM 41 (L) 02/25/2014   Lab Results  Component Value Date   TOTALPROTELP 7.2 02/25/2014   ALBUMINELP 54.3 (L) 02/25/2014   A1GS 4.4 02/25/2014   A2GS 13.7 (H) 02/25/2014   BETS 7.0 02/25/2014   BETA2SER 6.2 02/25/2014   GAMS 14.4 02/25/2014   MSPIKE NOT DET 02/25/2014   SPEI * 02/25/2014     Chemistry      Component Value Date/Time   NA 140 05/26/2016 0946   NA 144 08/30/2015 1054   K 4.3 05/26/2016 0946   K 4.7 08/30/2015 1054   CL 105 05/26/2016 0946   CL 101 10/31/2013 1041   CO2 26 05/26/2016 0946   CO2 23 08/30/2015 1054   BUN 20 05/26/2016 0946   BUN 26.9 (H) 08/30/2015 1054   CREATININE 1.44 (H) 05/26/2016 0946   CREATININE 1.32 (H) 09/21/2015 1241   CREATININE 1.6 (H) 08/30/2015 1054      Component Value Date/Time   CALCIUM 9.9 05/26/2016 0946   CALCIUM 9.7 08/30/2015 1054   ALKPHOS 83 05/26/2016 0946   ALKPHOS 99 08/30/2015 1054   AST 19 05/26/2016 0946   AST 27 08/30/2015 1054   ALT 19 05/26/2016 0946   ALT 19 08/30/2015 1054   BILITOT 0.5 05/26/2016 0946   BILITOT 0.44 08/30/2015 1054      Impression and Plan: [name] is a 76 yo caucasian female with stage A CLL. She continues to do well and WBC count is stable at 25.5. Hgb 11.3 with MCV of 95.  She is symptomatic at this time with fatigue and occasional SOB with exertion. She has history of anemia so we will check her iron studies. Results are pending and we will bring her back in next week for  infusion if needed.  We will continue to follow-up with her and plan to see her back again in another year.  She will contact our office with any questions or concerns. We can certainly see her sooner if need be.   Eliezer Bottom, NP 8/29/20181:14 PM

## 2016-08-30 NOTE — Telephone Encounter (Addendum)
Patient aware of results. Appointment made.   ----- Message from Eliezer Bottom, NP sent at 08/30/2016  2:13 PM EDT ----- Regarding: Iron  She will need IV iron this week or next. LOS sent to [facility]. Thanks!  Sarah  ----- Message ----- From: Interface, Lab In Three Zero One Sent: 08/30/2016   1:59 PM To: Eliezer Bottom, NP

## 2016-08-31 ENCOUNTER — Ambulatory Visit (HOSPITAL_BASED_OUTPATIENT_CLINIC_OR_DEPARTMENT_OTHER): Payer: Medicare Other

## 2016-08-31 VITALS — BP 188/76 | HR 74 | Temp 97.9°F | Resp 17

## 2016-08-31 DIAGNOSIS — D509 Iron deficiency anemia, unspecified: Secondary | ICD-10-CM

## 2016-08-31 DIAGNOSIS — D508 Other iron deficiency anemias: Secondary | ICD-10-CM

## 2016-08-31 MED ORDER — SODIUM CHLORIDE 0.9 % IV SOLN
510.0000 mg | Freq: Once | INTRAVENOUS | Status: AC
  Administered 2016-08-31: 510 mg via INTRAVENOUS
  Filled 2016-08-31: qty 17

## 2016-08-31 MED ORDER — SODIUM CHLORIDE 0.9 % IV SOLN
Freq: Once | INTRAVENOUS | Status: AC
  Administered 2016-08-31: 13:00:00 via INTRAVENOUS

## 2016-08-31 NOTE — Progress Notes (Signed)
Patient reports taking her medications around 9am. 188/73 when repeated. She has no symptoms of HTN. Will be given documentation of VS when discharged and will take them to her PCP

## 2016-08-31 NOTE — Patient Instructions (Signed)

## 2016-09-04 ENCOUNTER — Other Ambulatory Visit: Payer: Self-pay | Admitting: Endocrinology

## 2016-09-04 ENCOUNTER — Other Ambulatory Visit: Payer: Self-pay | Admitting: Cardiovascular Disease

## 2016-09-05 NOTE — Telephone Encounter (Signed)
Rx(s) sent to pharmacy electronically.  

## 2016-09-14 ENCOUNTER — Other Ambulatory Visit (INDEPENDENT_AMBULATORY_CARE_PROVIDER_SITE_OTHER): Payer: Medicare Other

## 2016-09-14 ENCOUNTER — Other Ambulatory Visit: Payer: Self-pay | Admitting: Gastroenterology

## 2016-09-14 DIAGNOSIS — E1165 Type 2 diabetes mellitus with hyperglycemia: Secondary | ICD-10-CM | POA: Diagnosis not present

## 2016-09-14 DIAGNOSIS — Z794 Long term (current) use of insulin: Secondary | ICD-10-CM

## 2016-09-14 LAB — COMPREHENSIVE METABOLIC PANEL
ALBUMIN: 4.1 g/dL (ref 3.5–5.2)
ALK PHOS: 92 U/L (ref 39–117)
ALT: 17 U/L (ref 0–35)
AST: 21 U/L (ref 0–37)
BUN: 23 mg/dL (ref 6–23)
CO2: 25 mEq/L (ref 19–32)
CREATININE: 1.33 mg/dL — AB (ref 0.40–1.20)
Calcium: 9.8 mg/dL (ref 8.4–10.5)
Chloride: 109 mEq/L (ref 96–112)
GFR: 41.11 mL/min — ABNORMAL LOW (ref >60.00)
GLUCOSE: 64 mg/dL — AB (ref 70–99)
Potassium: 4.2 mEq/L (ref 3.5–5.1)
SODIUM: 143 meq/L (ref 135–145)
TOTAL PROTEIN: 6.7 g/dL (ref 6.0–8.3)
Total Bilirubin: 0.4 mg/dL (ref 0.2–1.2)

## 2016-09-15 ENCOUNTER — Other Ambulatory Visit: Payer: Medicare Other

## 2016-09-19 ENCOUNTER — Ambulatory Visit (INDEPENDENT_AMBULATORY_CARE_PROVIDER_SITE_OTHER): Payer: Medicare Other | Admitting: Endocrinology

## 2016-09-19 ENCOUNTER — Encounter: Payer: Self-pay | Admitting: Endocrinology

## 2016-09-19 VITALS — BP 150/94 | HR 73 | Ht 66.0 in | Wt 170.8 lb

## 2016-09-19 DIAGNOSIS — I10 Essential (primary) hypertension: Secondary | ICD-10-CM | POA: Diagnosis not present

## 2016-09-19 DIAGNOSIS — E1165 Type 2 diabetes mellitus with hyperglycemia: Secondary | ICD-10-CM

## 2016-09-19 DIAGNOSIS — I208 Other forms of angina pectoris: Secondary | ICD-10-CM | POA: Diagnosis not present

## 2016-09-19 DIAGNOSIS — N289 Disorder of kidney and ureter, unspecified: Secondary | ICD-10-CM

## 2016-09-19 DIAGNOSIS — Z794 Long term (current) use of insulin: Secondary | ICD-10-CM | POA: Diagnosis not present

## 2016-09-19 LAB — POCT GLYCOSYLATED HEMOGLOBIN (HGB A1C): HEMOGLOBIN A1C: 5.7

## 2016-09-19 MED ORDER — AMLODIPINE BESYLATE 5 MG PO TABS
5.0000 mg | ORAL_TABLET | Freq: Every day | ORAL | 3 refills | Status: DC
Start: 2016-09-19 — End: 2017-01-11

## 2016-09-19 MED ORDER — LIRAGLUTIDE 18 MG/3ML ~~LOC~~ SOPN: PEN_INJECTOR | SUBCUTANEOUS | 3 refills | Status: DC

## 2016-09-19 NOTE — Patient Instructions (Addendum)
N insulin 36 units  Snack mid-day  Victoza 0.6 + 5 clicks

## 2016-09-19 NOTE — Progress Notes (Signed)
Patient ID: [name], female   DOB: [birth year], 76 y.o.   MRN: [id number]            Reason for Appointment: Followup for Type 2 Diabetes   History of Present Illness:          Diagnosis: Type 2 diabetes mellitus, date of diagnosis: 1998       Past history:   She was treated with metformin at diagnosis when this had been continued until 2015 Subsequently Amaryl was also added several years ago and this has been continued Her blood sugars were under fair control between 2010 and early 2013 with A1c ranging from 7.4-9.4, mostly under 8% However since 08/2011 her A1c has been mostly over 8%  Insulin was added in 2014 with small doses of Lantus and this has been progressively increased She was started on mealtime insulin on her initial consultation in 9/15 because of high postprandial readings, sometimes over 300 With adding Victoza in 02/2014 her blood sugars were somewhat better with A1c coming down below 8%  Recent history:   INSULIN regimen: Regular 16 units before meals.  NPH 38 units in the morning and 20 hs  Non-insulin hypoglycemic drugs the patient is taking are:  Metformin 500 a.m., 1000 mg p.m. and Victoza 1.2 mg daily  A1c has been consistently under 7, now lower than usual at 5.7  Current blood sugar patterns, daily management and problems identified:  Fasting blood sugars: Recently fasting blood sugars are fairly within the target range, usually more than 100 but not fluctuating as much  Postprandial readings:  She does some readings after supper These are relatively well controlled She thinks that blood sugars maybe higher when she is occasionally eating sweets which she likes Now not checking readings after breakfast and usually not in the afternoon when she is not eating much However blood sugar was 64 in the lab in the early afternoon  Insulin regimen:  She is taking 38 units of NPH in the morning instead of 36 that was prescribed before  Currently  consistent with taking the night dose at bedtime  Since she is eating mostly 2 meals a day she is usually taking 2 injections of regular insulin, not reducing the dose in the morning even if eating smaller meals  Does not adjust the dose based on what she is eating       HYPOGLYCEMIA: None including overnight, lowest blood sugar 77 in the afternoon  Glucose monitoring:  done 1-2 times a day         Glucometer: One Touch.      Blood Glucose readings by time of day from download:  Mean values apply above for all meters except median for One Touch  PRE-MEAL Fasting Lunch Dinner Bedtime Overall  Glucose range: 108-155   102-178   Mean/median: 130  119  127    Self-care: The diet that the patient has been following is: none, usually eating low fat meals Meals: 2-3 meals per day at irregular times. Dinner 6-7 pm Breakfast is cereal but sometimes cheese toast or oatmeal, Recently yogurt, often eating sandwiches for meals, snacks will be with crackers          Dietician visit, most recent: never.  She saw the CDE in 02/2014              Exercise: none   Weight history: 150-170 previously  Wt Readings from Last 3 Encounters:  09/19/16 170 lb 12.8 oz (77.5 kg)  08/30/16 172 lb (78 kg)  08/03/16 171 lb 2 oz (77.6 kg)    Glycemic control:   Lab Results  Component Value Date   HGBA1C 5.7 09/19/2016   HGBA1C 6.8 (H) 05/26/2016   HGBA1C 6.9 (H) 02/25/2016   Lab Results  Component Value Date   MICROALBUR 22.4 (H) 02/25/2016   LDLCALC 45 07/27/2015   CREATININE 1.33 (H) 09/14/2016     OTHER active problems  discussed in review of systems   Allergies as of 09/19/2016      Reactions   Codeine    REACTION: makes her nervous, orTylenol #3   Ibuprofen    REACTION: nervous   Meperidine Hcl    REACTION: nasuea and vomitting   Naproxen Sodium    REACTION: nervous      Medication List       Accurate as of 09/19/16  3:11 PM. Always use your most recent med list.            amLODipine 5 MG tablet Commonly known as:  NORVASC Take 1 tablet (5 mg total) by mouth daily.   aspirin 81 MG EC tablet Take 1 tablet (81 mg total) by mouth daily.   atenolol 100 MG tablet Commonly known as:  TENORMIN Take 1 tablet (100 mg total) by mouth daily.   atorvastatin 20 MG tablet Commonly known as:  LIPITOR TAKE 1 TABLET(20 MG) BY MOUTH DAILY   azelastine 0.1 % nasal spray Commonly known as:  ASTELIN Place 2 sprays into both nostrils at bedtime as needed for rhinitis. Use in each nostril as directed   cholecalciferol 1000 units tablet Commonly known as:  VITAMIN D Take 1,000 Units by mouth daily.   clopidogrel 75 MG tablet Commonly known as:  PLAVIX Take 1 tablet (75 mg total) by mouth daily.   clorazepate 7.5 MG tablet Commonly known as:  TRANXENE Take 1 tablet (7.5 mg total) by mouth as needed.   docusate sodium 100 MG capsule Commonly known as:  COLACE Take 200 mg by mouth daily as needed for mild constipation.   fluticasone 50 MCG/ACT nasal spray Commonly known as:  FLONASE Place 2 sprays into both nostrils daily as needed for allergies or rhinitis.   HYDROcodone-acetaminophen 5-325 MG tablet Commonly known as:  NORCO/VICODIN Take 1 tablet by mouth 3 (three) times daily as needed for moderate pain.   insulin NPH Human 100 UNIT/ML injection Commonly known as:  HUMULIN N,NOVOLIN N 20-38 Units 2 (two) times daily before a meal. Inject 38 units into the skin every morning and inject 20 units into the skin at bedtime.   insulin regular 100 units/mL injection Commonly known as:  NOVOLIN R,HUMULIN R Inject 16 Units into the skin 2 (two) times daily before a meal.   INSULIN SYRINGE .5CC/31GX5/16" 31G X 5/16" 0.5 ML Misc USE TO INJECT INSULIN 5 TIMES PER DAY   irbesartan 300 MG tablet Commonly known as:  AVAPRO TAKE 1 TABLET BY MOUTH DAILY   isosorbide mononitrate 30 MG 24 hr tablet Commonly known as:  IMDUR Take 1 tablet (30 mg total) by mouth  daily.   liraglutide 18 MG/3ML Sopn Commonly known as:  VICTOZA ADMINISTER 1.2 MG UNDER THE SKIN DAILY   metFORMIN 500 MG tablet Commonly known as:  GLUCOPHAGE TAKE 1 TABLET BY MOUTH EVERY MORNING AND 2 TABLETS EVERY EVENING   mometasone 0.1 % cream Commonly known as:  ELOCON Use as directed   multivitamin capsule Take 1 capsule by mouth daily.  neomycin-polymyxin-hydrocortisone OTIC solution Commonly known as:  CORTISPORIN 2-3 drops in each ear three times daily   omeprazole 40 MG capsule Commonly known as:  PRILOSEC TAKE 1 CAPSULE BY MOUTH DAILY   ONE TOUCH ULTRA SYSTEM KIT w/Device Kit 1 kit by Does not apply route once.   ONE TOUCH ULTRA TEST test strip Generic drug:  glucose blood USE TO CHECK BLOOD SUGARS TWICE DAILY AS DIRECTED            Discharge Care Instructions        Start     Ordered   09/19/16 0000  POCT glycosylated hemoglobin (Hb A1C)     09/19/16 0911   09/19/16 0000  liraglutide (VICTOZA) 18 MG/3ML SOPN     09/19/16 0911   09/19/16 0000  amLODipine (NORVASC) 5 MG tablet  Daily     09/19/16 0917      Allergies:  Allergies  Allergen Reactions  . Codeine     REACTION: makes her nervous, orTylenol #3  . Ibuprofen     REACTION: nervous  . Meperidine Hcl     REACTION: nasuea and vomitting  . Naproxen Sodium     REACTION: nervous    Past Medical History:  Diagnosis Date  . Acute blood loss anemia 09/17/2015  . Anemia   . Anginal pain ([place]) 2017  . Arthritis    "hands and knees mostly" (09/16/2015)  . B12 deficiency anemia   . CAD (coronary artery disease)    Dr Gwenlyn Found  . CLL (chronic lymphocytic leukemia) ([place]) 02/25/2014  . Depression   . DJD (degenerative joint disease)   . 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 Gastritis   . GERD (gastroesophageal reflux disease)   . Headache(784.0)   . History of blood transfusion    "I"ve had 20 some; thru birth of children, loss of blood, last 2 were in ~ 1999 before my  cancer surgery" (09/16/2015)  . History of hiatal hernia   . History of shingles   . Hx of colonic polyps   . Hyperlipidemia   . Hypertension   . 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 Pneumonia    X 1  . Postoperative hematoma involving circulatory system following cardiac catheterization 09/17/2015  . S/P cardiac cath 09/16/15 09/17/2015  . Type II diabetes mellitus ([place])   . 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 Venous insufficiency     Past Surgical History:  Procedure Laterality Date  . CARDIAC CATHETERIZATION  1997; 2008; 09/16/2015  . CARDIAC CATHETERIZATION N/A 09/16/2015   Procedure: Right/Left Heart Cath and Coronary/Graft Angiography;  Surgeon: Lorretta Harp, MD;  Location: [facility];  Service: Cardiovascular;  Laterality: N/A;  . CESAREAN SECTION  1966  . CORONARY ARTERY BYPASS GRAFT  1997   CABG X5  . EYE SURGERY     2 laser surgeries on left eye with implant  . EYE SURGERY Bilateral    cataracts  . FRACTURE SURGERY    . HERNIA REPAIR    . LAPAROSCOPIC ASSISTED VENTRAL HERNIA REPAIR  09/2008    with incarcerated colon;  Dr. Lucia Gaskins  . MOHS SURGERY  06/2016   BCC  . PATELLA FRACTURE SURGERY Left 11/1994   "crushed my knee"; put in a plate & 6 screws"  . REFRACTIVE SURGERY Left 07/30/2015  . 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 March Rummage  . SHOULDER ARTHROSCOPY W/ ROTATOR CUFF  REPAIR Right 1997  . TONSILLECTOMY  1960s  . TOTAL KNEE ARTHROPLASTY WITH HARDWARE REMOVAL Left 12/1994    (infex, hardware removed )  . TUBAL LIGATION  1966    Family History  Problem Relation Age of Onset  . Heart disease Mother 57  . Heart disease Father 51       MI  . Hypertension Child   . Heart attack Child   . Heart disease Maternal Aunt        x 2, all deceased  . Heart disease Maternal Uncle        x 4, all deceased  . Emphysema Brother 78  . Colon cancer Neg Hx   . Breast cancer Neg Hx     Social History:   reports that she has never smoked. She has never used smokeless tobacco. She reports that she does not drink alcohol or use drugs.    Review of Systems    Still complaining of fatigue, maybe slightly better with an iron infusion given by hematologist      Lipids: Has had excellent control of hypercholesterolemia with long-term use of Crestor She has a history of coronary bypass surgery.   Also has been  on Niaspan, Prescribed by PCP       Lab Results  Component Value Date   CHOL 104 05/26/2016   HDL 30.90 (L) 05/26/2016   LDLCALC 45 07/27/2015   LDLDIRECT 36.0 05/26/2016   TRIG 257.0 (H) 05/26/2016   CHOLHDL 3 05/26/2016                  The blood pressure has been Treated with atenolol 100 mg and currently on half of 300 mg Avapro because of renal dysfunction Previously on amlodipine and HCTZ also No recent edema Not checking at home Has not had a recent follow-up with PCP for this   BP Readings from Last 3 Encounters:  09/19/16 (!) 150/94  08/31/16 (!) 188/76  08/30/16 (!) 178/81     Lab Results  Component Value Date   CREATININE 1.33 (H) 09/14/2016   BUN 23 09/14/2016   NA 143 09/14/2016   K 4.2 09/14/2016   CL 109 09/14/2016   CO2 25 09/14/2016    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 creatinine Is upper normal usually, Slightly better now No history of hyperkalemia    Lab Results  Component Value Date   CREATININE 1.33 (H) 09/14/2016   CREATININE 1.4 (H) 08/30/2016   CREATININE 1.44 (H) 05/26/2016       No history of Numbness, tingling or burning in feet   Diabetic foot exam Done in 5/18 showing mild neuropathy    Physical Examination:  BP (!) 150/94   Pulse 73   Ht _0  (1.676 m)   Wt 170 lb 12.8 oz (77.5 kg)   SpO2 95%   BMI 27.57 kg/m   Repeat blood pressure 180/80 left arm  No edema present   Diabetes type 2, uncontrolled with BMI 27   See history of present illness for detailed discussion of her current blood sugar patterns, management and problems  identified  Her A1c is surprisingly low at 5.7 Although she is showing less fluctuation in her blood sugars still has sporadically higher readings after evening meal and sometimes low normal or early evening Checking blood sugars mostly in the morning and after supper but has had any symptomatic glucose of 64 in the afternoon from taking a relatively higher dose of NPH in the morning She is complaining  about the cost of Victoza Otherwise doing well with maintaining her weight Only occasionally will go off her diet causing readings above 180 Symptomatic hypoglycemia   RENAL insufficiency: Her creatinine is slightly improved  HYPERTENSION: Blood pressure is significantly high recently including with the hematologist She will go back to 5 mg, amlodipine along with her other 2 medications Recommended that she follow-up with PCP couple of weeks for further adjustment   PLAN:   Since she has had a low normal blood sugar in the afternoon she can cut back on her NPH by 2 units in the morning  Also since she is concerned about the cost of Victoza she can use 0.9 mg instead of 1.2 and showed her how to do 5 clicks beyond the 0.6  He does need to check some readings after breakfast  Also if she is having any symptoms suggestive of low blood sugar she should monitor glucose at that time  Make sure she has protein at breakfast  Encourage her to be active with regular walking  May take additional 2-4 units of regular insulin at suppertime if planning to eat dessert   Today's face-to-face time for evaluation and management of multiple problems, physical exam,, counseling, review of medications, review of labs and records = 25 minutes  [name] 09/19/2016, 3:11 PM   Note: This office note was prepared with Dragon voice recognition system technology. Any transcriptional errors that result from this process are unintentional.

## 2016-09-26 ENCOUNTER — Other Ambulatory Visit: Payer: Self-pay | Admitting: Cardiovascular Disease

## 2016-10-10 DIAGNOSIS — E113212 Type 2 diabetes mellitus with mild nonproliferative diabetic retinopathy with macular edema, left eye: Secondary | ICD-10-CM | POA: Diagnosis not present

## 2016-10-10 DIAGNOSIS — E113291 Type 2 diabetes mellitus with mild nonproliferative diabetic retinopathy without macular edema, right eye: Secondary | ICD-10-CM | POA: Diagnosis not present

## 2016-10-10 LAB — HM DIABETES EYE EXAM

## 2016-10-12 ENCOUNTER — Other Ambulatory Visit (HOSPITAL_BASED_OUTPATIENT_CLINIC_OR_DEPARTMENT_OTHER): Payer: Medicare Other

## 2016-10-12 ENCOUNTER — Other Ambulatory Visit: Payer: Medicare Other

## 2016-10-12 ENCOUNTER — Ambulatory Visit (HOSPITAL_BASED_OUTPATIENT_CLINIC_OR_DEPARTMENT_OTHER): Payer: Medicare Other | Admitting: Hematology & Oncology

## 2016-10-12 ENCOUNTER — Ambulatory Visit: Payer: Medicare Other | Admitting: Family

## 2016-10-12 VITALS — BP 157/67 | HR 74 | Temp 98.2°F | Resp 18 | Wt 173.2 lb

## 2016-10-12 DIAGNOSIS — C9190 Lymphoid leukemia, unspecified not having achieved remission: Secondary | ICD-10-CM | POA: Diagnosis not present

## 2016-10-12 DIAGNOSIS — D508 Other iron deficiency anemias: Secondary | ICD-10-CM

## 2016-10-12 DIAGNOSIS — D72829 Elevated white blood cell count, unspecified: Secondary | ICD-10-CM | POA: Diagnosis not present

## 2016-10-12 DIAGNOSIS — Z85038 Personal history of other malignant neoplasm of large intestine: Secondary | ICD-10-CM

## 2016-10-12 DIAGNOSIS — C9110 Chronic lymphocytic leukemia of B-cell type not having achieved remission: Secondary | ICD-10-CM

## 2016-10-12 DIAGNOSIS — D509 Iron deficiency anemia, unspecified: Secondary | ICD-10-CM

## 2016-10-12 DIAGNOSIS — I519 Heart disease, unspecified: Secondary | ICD-10-CM | POA: Diagnosis not present

## 2016-10-12 DIAGNOSIS — Z23 Encounter for immunization: Secondary | ICD-10-CM | POA: Diagnosis not present

## 2016-10-12 DIAGNOSIS — C179 Malignant neoplasm of small intestine, unspecified: Secondary | ICD-10-CM

## 2016-10-12 LAB — CMP (CANCER CENTER ONLY)
ALBUMIN: 3.6 g/dL (ref 3.3–5.5)
ALT(SGPT): 32 U/L (ref 10–47)
AST: 28 U/L (ref 11–38)
Alkaline Phosphatase: 99 U/L — ABNORMAL HIGH (ref 26–84)
BUN: 20 mg/dL (ref 7–22)
CHLORIDE: 109 meq/L — AB (ref 98–108)
CO2: 27 mEq/L (ref 18–33)
Calcium: 9.6 mg/dL (ref 8.0–10.3)
Creat: 1.5 mg/dl — ABNORMAL HIGH (ref 0.6–1.2)
Glucose, Bld: 136 mg/dL — ABNORMAL HIGH (ref 73–118)
POTASSIUM: 4.1 meq/L (ref 3.3–4.7)
SODIUM: 147 meq/L — AB (ref 128–145)
TOTAL PROTEIN: 7 g/dL (ref 6.4–8.1)
Total Bilirubin: 0.6 mg/dl (ref 0.20–1.60)

## 2016-10-12 LAB — CBC WITH DIFFERENTIAL (CANCER CENTER ONLY)
HCT: 37.9 % (ref 34.8–46.6)
HEMOGLOBIN: 12.6 g/dL (ref 11.6–15.9)
MCH: 31.5 pg (ref 26.0–34.0)
MCHC: 33.2 g/dL (ref 32.0–36.0)
MCV: 95 fL (ref 81–101)
Platelets: 197 10*3/uL (ref 145–400)
RBC: 4 10*6/uL (ref 3.70–5.32)
RDW: 14 % (ref 11.1–15.7)
WBC: 32.9 10*3/uL — AB (ref 3.9–10.0)

## 2016-10-12 LAB — MANUAL DIFFERENTIAL (CHCC SATELLITE)
ALC: 20.4 10*3/uL — ABNORMAL HIGH (ref 0.6–2.2)
ANC (CHCC HP manual diff): 11.2 10*3/uL — ABNORMAL HIGH (ref 1.5–6.7)
EOS: 1 % (ref 0–7)
LYMPH: 62 % — AB (ref 14–48)
MONO: 3 % (ref 0–13)
PLT EST ~~LOC~~: ADEQUATE
SEG: 34 % — AB (ref 40–75)

## 2016-10-12 LAB — CHCC SATELLITE - SMEAR

## 2016-10-12 MED ORDER — INFLUENZA VAC SPLIT QUAD 0.5 ML IM SUSY
0.5000 mL | PREFILLED_SYRINGE | Freq: Once | INTRAMUSCULAR | Status: AC
  Administered 2016-10-12: 0.5 mL via INTRAMUSCULAR

## 2016-10-12 MED ORDER — INFLUENZA VAC SPLIT QUAD 0.5 ML IM SUSY
PREFILLED_SYRINGE | INTRAMUSCULAR | Status: AC
  Filled 2016-10-12: qty 0.5

## 2016-10-12 NOTE — Progress Notes (Signed)
Hematology and Oncology Follow Up Visit  Reader [id number] December 11, 1940 76 y.o. 10/12/2016   Principle Diagnosis:  CLL -stage A Remote history of colon cancer Iron deficiency anemia  Current Therapy:   IV iron as indicated-patient received a dose in September 2018   Interim History:  [name] is here today for her follow-up. She's not feeling too well. She feels tired. She does not have a lot of energy.  She has had no chest pain. She's had no nausea or vomiting. She's had no change in bowel or bladder habits.  She has had no fever. There's been no rashes.  Her appetite has been okay. There's been no weight loss or weight gain. She's had no headache. There's been no mouth sores.  She did get some iron back in early September.  At that point, her ferritin was only 18 with an iron saturation of 14%. She get 1 dose of Feraheme. This did make her feel better. However, she still does not feel as well as she should.   Overall, her performance status is ECOG 1.   Medications:  Allergies as of 10/12/2016      Reactions   Codeine    REACTION: makes her nervous, orTylenol #3   Ibuprofen    REACTION: nervous   Meperidine Hcl    REACTION: nasuea and vomitting   Naproxen Sodium    REACTION: nervous      Medication List       Accurate as of 10/12/16  3:16 PM. Always use your most recent med list.          amLODipine 5 MG tablet Commonly known as:  NORVASC Take 1 tablet (5 mg total) by mouth daily.   aspirin 81 MG EC tablet Take 1 tablet (81 mg total) by mouth daily.   atenolol 100 MG tablet Commonly known as:  TENORMIN Take 1 tablet (100 mg total) by mouth daily.   atorvastatin 20 MG tablet Commonly known as:  LIPITOR TAKE 1 TABLET(20 MG) BY MOUTH DAILY   azelastine 0.1 % nasal spray Commonly known as:  ASTELIN Place 2 sprays into both nostrils at bedtime as needed for rhinitis. Use in each nostril as directed   cholecalciferol 1000 units  tablet Commonly known as:  VITAMIN D Take 1,000 Units by mouth daily.   clopidogrel 75 MG tablet Commonly known as:  PLAVIX Take 1 tablet (75 mg total) by mouth daily.   clorazepate 7.5 MG tablet Commonly known as:  TRANXENE Take 1 tablet (7.5 mg total) by mouth as needed.   docusate sodium 100 MG capsule Commonly known as:  COLACE Take 200 mg by mouth daily as needed for mild constipation.   fluticasone 50 MCG/ACT nasal spray Commonly known as:  FLONASE Place 2 sprays into both nostrils daily as needed for allergies or rhinitis.   HYDROcodone-acetaminophen 5-325 MG tablet Commonly known as:  NORCO/VICODIN Take 1 tablet by mouth 3 (three) times daily as needed for moderate pain.   insulin NPH Human 100 UNIT/ML injection Commonly known as:  HUMULIN N,NOVOLIN N 20-38 Units 2 (two) times daily before a meal. Inject 38 units into the skin every morning and inject 20 units into the skin at bedtime.   insulin regular 100 units/mL injection Commonly known as:  NOVOLIN R,HUMULIN R Inject 16 Units into the skin 2 (two) times daily before a meal.   INSULIN SYRINGE .5CC/31GX5/16" 31G X 5/16" 0.5 ML Misc USE TO INJECT INSULIN 5 TIMES PER DAY  irbesartan 300 MG tablet Commonly known as:  AVAPRO TAKE 1 TABLET BY MOUTH DAILY   isosorbide mononitrate 30 MG 24 hr tablet Commonly known as:  IMDUR Take 1 tablet (30 mg total) by mouth daily.   liraglutide 18 MG/3ML Sopn Commonly known as:  VICTOZA ADMINISTER 1.2 MG UNDER THE SKIN DAILY   metFORMIN 500 MG tablet Commonly known as:  GLUCOPHAGE TAKE 1 TABLET BY MOUTH EVERY MORNING AND 2 TABLETS EVERY EVENING   mometasone 0.1 % cream Commonly known as:  ELOCON Use as directed   multivitamin capsule Take 1 capsule by mouth daily.   neomycin-polymyxin-hydrocortisone OTIC solution Commonly known as:  CORTISPORIN 2-3 drops in each ear three times daily   omeprazole 40 MG capsule Commonly known as:  PRILOSEC TAKE 1 CAPSULE BY  MOUTH DAILY   ONE TOUCH ULTRA SYSTEM KIT w/Device Kit 1 kit by Does not apply route once.   ONE TOUCH ULTRA TEST test strip Generic drug:  glucose blood USE TO CHECK BLOOD SUGARS TWICE DAILY AS DIRECTED       Allergies:  Allergies  Allergen Reactions  . Codeine     REACTION: makes her nervous, orTylenol #3  . Ibuprofen     REACTION: nervous  . Meperidine Hcl     REACTION: nasuea and vomitting  . Naproxen Sodium     REACTION: nervous    Past Medical History, Surgical history, Social history, and Family History were reviewed and updated.  Review of Systems: As stated in the interim history  Physical Exam:  weight is 173 lb 4 oz (78.6 kg). Her oral temperature is 98.2 F (36.8 C). Her blood pressure is 157/67 (abnormal) and her pulse is 74. Her respiration is 18 and oxygen saturation is 98%.   Wt Readings from Last 3 Encounters:  10/12/16 173 lb 4 oz (78.6 kg)  09/19/16 170 lb 12.8 oz (77.5 kg)  08/30/16 172 lb (78 kg)    Well-developed but elderly white female in no obvious distress. Head and neck exam shows no ocular or oral lesions. She has no obvious adenopathy in the neck. Lungs are clear bilaterally. Cardiac exam regular rate and rhythm with no murmurs, rubs or bruits. Axillary exam shows no obvious bilateral axillary adenopathy. Abdomen is soft. She has decent bowel sounds. There is no fluid wave. There is no palpable liver or spleen tip. Back exam shows no tenderness over the spine, ribs or hips. Extremities shows no clubbing, cyanosis or edema. Neurological exam shows no focal neurological deficits. Skin exam shows no rashes, ecchymoses or petechia. red   Lab Results  Component Value Date   WBC 25.5 (H) 08/30/2016   HGB 11.3 (L) 08/30/2016   HCT 34.4 (L) 08/30/2016   MCV 95 08/30/2016   PLT 195 08/30/2016   Lab Results  Component Value Date   FERRITIN 18 08/30/2016   IRON 51 08/30/2016   TIBC 358 08/30/2016   UIBC 306 08/30/2016   IRONPCTSAT 14 (L)  08/30/2016   Lab Results  Component Value Date   RBC 3.63 (L) 08/30/2016   No results found for: Nils Pyle [facility] Lab Results  Component Value Date   IGGSERUM 1,030 02/25/2014   IGA 281 02/25/2014   IGMSERUM 41 (L) 02/25/2014   Lab Results  Component Value Date   TOTALPROTELP 7.2 02/25/2014   ALBUMINELP 54.3 (L) 02/25/2014   A1GS 4.4 02/25/2014   A2GS 13.7 (H) 02/25/2014   BETS 7.0 02/25/2014   BETA2SER 6.2 02/25/2014   GAMS 14.4  02/25/2014   MSPIKE NOT DET 02/25/2014   SPEI * 02/25/2014     Chemistry      Component Value Date/Time   NA 147 (H) 10/12/2016 1346   NA 143 08/30/2016 0954   K 4.1 10/12/2016 1346   K 4.5 08/30/2016 0954   CL 109 (H) 10/12/2016 1346   CO2 27 10/12/2016 1346   CO2 24 08/30/2016 0954   BUN 20 10/12/2016 1346   BUN 20.5 08/30/2016 0954   CREATININE 1.5 (H) 10/12/2016 1346   CREATININE 1.4 (H) 08/30/2016 0954      Component Value Date/Time   CALCIUM 9.6 10/12/2016 1346   CALCIUM 9.8 08/30/2016 0954   ALKPHOS 99 (H) 10/12/2016 1346   ALKPHOS 113 08/30/2016 0954   AST 28 10/12/2016 1346   AST 23 08/30/2016 0954   ALT 32 10/12/2016 1346   ALT 20 08/30/2016 0954   BILITOT 0.60 10/12/2016 1346   BILITOT 0.38 08/30/2016 0954      Impression and Plan: [name] is a 76 yo caucasian female with stage A CLL.   I am a little worried about the white cell count going up. I have to think that her CLL might becoming more active and this might be causing her symptoms.  I want to get a CT scan. I want to see what this looks like. If we see a lot of lymphadenopathy, then we might have to consider her for treatment. I probably would consider chlorambucil/Gazyva.  She also has cardiac disease. 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 So we have other things that we have to watch out for.  I'll like to see her back in 3 weeks.  I spent about 40 minutes with her today.    [name],  MD 10/11/20183:16 PM

## 2016-10-13 ENCOUNTER — Other Ambulatory Visit: Payer: Self-pay | Admitting: Cardiology

## 2016-10-13 LAB — IRON AND TIBC
%SAT: 31 % (ref 21–57)
Iron: 90 ug/dL (ref 41–142)
TIBC: 291 ug/dL (ref 236–444)
UIBC: 201 ug/dL (ref 120–384)

## 2016-10-13 LAB — RETICULOCYTES: RETICULOCYTE COUNT: 1.8 % (ref 0.6–2.6)

## 2016-10-13 LAB — FERRITIN: Ferritin: 118 ng/ml (ref 9–269)

## 2016-10-13 NOTE — Telephone Encounter (Signed)
REFILL 

## 2016-10-18 ENCOUNTER — Ambulatory Visit (HOSPITAL_COMMUNITY)
Admission: RE | Admit: 2016-10-18 | Discharge: 2016-10-18 | Disposition: A | Discharge status: Home / Self Care | Payer: Medicare Other | Source: Ambulatory Visit | Attending: Hematology & Oncology | Admitting: Hematology & Oncology

## 2016-10-18 ENCOUNTER — Encounter (HOSPITAL_COMMUNITY): Payer: Self-pay

## 2016-10-18 DIAGNOSIS — C9110 Chronic lymphocytic leukemia of B-cell type not having achieved remission: Secondary | ICD-10-CM

## 2016-10-18 DIAGNOSIS — I2510 Atherosclerotic heart disease of native coronary artery without angina pectoris: Secondary | ICD-10-CM | POA: Diagnosis not present

## 2016-10-18 DIAGNOSIS — Z23 Encounter for immunization: Secondary | ICD-10-CM

## 2016-10-18 DIAGNOSIS — C179 Malignant neoplasm of small intestine, unspecified: Secondary | ICD-10-CM

## 2016-10-18 MED ORDER — IOPAMIDOL (ISOVUE-300) INJECTION 61%
100.0000 mL | Freq: Once | INTRAVENOUS | Status: AC | PRN
  Administered 2016-10-18: 80 mL via INTRAVENOUS

## 2016-10-18 MED ORDER — IOPAMIDOL (ISOVUE-300) INJECTION 61%
INTRAVENOUS | Status: AC
  Filled 2016-10-18: qty 100

## 2016-10-19 ENCOUNTER — Telehealth: Payer: Self-pay | Admitting: *Deleted

## 2016-10-19 NOTE — Telephone Encounter (Addendum)
Patient is aware of results  ----- Message from [name], MD sent at 10/19/2016  8:09 AM EDT ----- Call - the Ct scan is totally normal!!  No CLL, cancer, etc..  pete

## 2016-10-20 ENCOUNTER — Encounter: Payer: Self-pay | Admitting: Cardiovascular Disease

## 2016-10-20 ENCOUNTER — Ambulatory Visit (INDEPENDENT_AMBULATORY_CARE_PROVIDER_SITE_OTHER): Payer: Medicare Other | Admitting: Cardiovascular Disease

## 2016-10-20 VITALS — BP 182/86 | HR 74 | Ht 66.0 in | Wt 172.6 lb

## 2016-10-20 DIAGNOSIS — I10 Essential (primary) hypertension: Secondary | ICD-10-CM | POA: Diagnosis not present

## 2016-10-20 DIAGNOSIS — E785 Hyperlipidemia, unspecified: Secondary | ICD-10-CM

## 2016-10-20 DIAGNOSIS — I208 Other forms of angina pectoris: Secondary | ICD-10-CM | POA: Diagnosis not present

## 2016-10-20 DIAGNOSIS — Z951 Presence of aortocoronary bypass graft: Secondary | ICD-10-CM | POA: Diagnosis not present

## 2016-10-20 NOTE — Progress Notes (Signed)
10/20/2016 [place]   1940/02/12  [id number]  Primary Physician Colon Branch, MD Primary Cardiologist: Lorretta Harp MD Lupe Carney, [place]  HPI:  [name] is a 76 y.o. female mildly overweight widowed Caucasian female mother of 2 living children (son at age 46 died of an overdose), grandmother of 5 grandchildren is a patient of Dr. Terance Ice. I last saw her in the office 10/22/15. Her cardiac risk factor profile is walkover treated diabetes, hypertension and hyperlipidemia. She has a strong family history of heart disease with a father who died of a myocardial infarction at age 32 and her mother at age 68. She had a heart attack in 1997 at which time I performed cardiac catheterization. Apparently I placed a balloon pump at that time and sent her to open heart surgery. Dr. Tharon Aquas Trigt performed coronary artery bypass grafting x5. She had her last catheterization performed by Dr. Rollene Fare December 2008 revealing patent grafts with normal LV function. She had a negative Myoview back in 2012. Since I saw her years ago she's had several episodes of chest pain most recent one this past Sunday which was more intense and prolonged.She had a Myoview stress test performed 08/05/15 that showed new anterolateral ischemia. She's had several episodes of chest pain since I saw her back a month ago. She underwent outpatient cardiac catheterization 09/16/15 revealing patent grafts with an unbypassed nondominant circumflex are normal LV function. Medical therapy was recommended. She really has had no significant chest pain since her heart catheterization. She saw [name] in the office 04/19/16 with atypical chest pain, dyspnea or lower extremity edema. A 2-D echo was ordered and was normal. 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 She did get iron infusion by Dr. Elnoria Howard . Her symptoms have somewhat improved.   No outpatient  prescriptions have been marked as taking for the 10/20/16 encounter (Office Visit) with Lorretta Harp, MD.     Allergies  Allergen Reactions  . Codeine     REACTION: makes her nervous, orTylenol #3  . Ibuprofen     REACTION: nervous  . Meperidine Hcl     REACTION: nasuea and vomitting  . Naproxen Sodium     REACTION: nervous    Social History   Social History  . Marital status: Widowed    Spouse name: N/A  . Number of children: 2  . Years of education: N/A   Occupational History  . retired-- [employer]    .  Retired   Social History Main Topics  . Smoking status: Never Smoker  . Smokeless tobacco: Never Used  . Alcohol use No  . Drug use: No  . Sexual activity: No   Other Topics Concern  . Not on file   Social History Narrative   Lives by herself, lost husband ~ 2004      Review of Systems: General: negative for chills, fever, night sweats or weight changes.  Cardiovascular: negative for chest pain, dyspnea on exertion, edema, orthopnea, palpitations, paroxysmal nocturnal dyspnea or shortness of breath Dermatological: negative for rash Respiratory: negative for cough or wheezing Urologic: negative for hematuria Abdominal: negative for nausea, vomiting, diarrhea, bright red blood per rectum, melena, or hematemesis Neurologic: negative for visual changes, syncope, or dizziness All other systems reviewed and are otherwise negative except as noted above.    Blood pressure (!) 182/86, pulse 74, height 5\' 6"  (1.676 m), weight  172 lb 9.6 oz (78.3 kg).  General appearance: alert and no distress Neck: no adenopathy, no carotid bruit, no JVD, supple, symmetrical, trachea midline and thyroid not enlarged, symmetric, no tenderness/mass/nodules Lungs: clear to auscultation bilaterally Heart: regular rate and rhythm, S1, S2 normal, no murmur, click, rub or gallop Extremities: extremities normal, atraumatic, no cyanosis or edema Pulses: 2+ and symmetric Skin: Skin  color, texture, turgor normal. No rashes or lesions Neurologic: Alert and oriented X 3, normal strength and tone. Normal symmetric reflexes. Normal coordination and gait  EKG sinus rhythm at 74 with fibrillar branch block. I personally reviewed his EKG.  ASSESSMENT AND PLAN:   Dyslipidemia History of dyslipidemia on statin therapy with recent lipid profile performed 05/26/16 revealing a total cholesterol of 4, triglyceride level of 257 and HDL of 90.  Essential hypertension History of essential hypertension with blood pressure measured today of 182/86 falling to 136/64 at the end of the visit.. Normally it runs between 00/34/9179 systolic. She is on atenolol and amlodipine as well as Avapro. Continued current meds at current dosing.  Hx of CABG History of coronary bypass grafting in 1997. I performed cardiac catheterization at that time and place an intra-aortic balloon pump. She went urgently to bypass grafting performed by Dr. Prescott Gum where she had 5 grafts placed. She underwent outpatient cardiac 09/16/15 revealing patent grafts with an unbypassed nondominant circumflex and normal LV function. Medical therapy was recommended. She is currently a symptomatically.      Lorretta Harp MD FACP,FACC,FAHA, [facility] 10/20/2016 9:24 AM

## 2016-10-20 NOTE — Assessment & Plan Note (Signed)
History of dyslipidemia on statin therapy with recent lipid profile performed 05/26/16 revealing a total cholesterol of 4, triglyceride level of 257 and HDL of 90.

## 2016-10-20 NOTE — Patient Instructions (Signed)
Medication Instructions: Your physician recommends that you continue on your current medications as directed. Please refer to the Current Medication list given to you today.   Follow-Up: We request that you follow-up in: 6 months with Luke Kilroy, PA and in 12 months with Dr Berry  You will receive a reminder letter in the mail two months in advance. If you don't receive a letter, please call our office to schedule the follow-up appointment.  If you need a refill on your cardiac medications before your next appointment, please call your pharmacy.  

## 2016-10-20 NOTE — Assessment & Plan Note (Addendum)
History of essential hypertension with blood pressure measured today of 182/86 falling to 136/64 at the end of the visit.. Normally it runs between 75/30/0511 systolic. She is on atenolol and amlodipine as well as Avapro. Continued current meds at current dosing.

## 2016-10-20 NOTE — Assessment & Plan Note (Signed)
History of coronary bypass grafting in 1997. I performed cardiac catheterization at that time and place an intra-aortic balloon pump. She went urgently to bypass grafting performed by Dr. Prescott Gum where she had 5 grafts placed. She underwent outpatient cardiac 09/16/15 revealing patent grafts with an unbypassed nondominant circumflex and normal LV function. Medical therapy was recommended. She is currently a symptomatically.

## 2016-10-25 ENCOUNTER — Telehealth: Payer: Self-pay | Admitting: *Deleted

## 2016-10-25 NOTE — Telephone Encounter (Signed)
Received Medical records from [facility]; forwarded to provider/SLS 10/24

## 2016-10-26 ENCOUNTER — Other Ambulatory Visit: Payer: Self-pay | Admitting: Cardiovascular Disease

## 2016-10-30 ENCOUNTER — Encounter: Payer: Self-pay | Admitting: Internal Medicine

## 2016-11-03 ENCOUNTER — Other Ambulatory Visit (HOSPITAL_BASED_OUTPATIENT_CLINIC_OR_DEPARTMENT_OTHER): Payer: Medicare Other

## 2016-11-03 ENCOUNTER — Ambulatory Visit (HOSPITAL_BASED_OUTPATIENT_CLINIC_OR_DEPARTMENT_OTHER): Payer: Medicare Other | Admitting: Hematology & Oncology

## 2016-11-03 VITALS — BP 168/72 | HR 73 | Temp 97.8°F | Resp 16 | Wt 175.0 lb

## 2016-11-03 DIAGNOSIS — Z23 Encounter for immunization: Secondary | ICD-10-CM | POA: Diagnosis not present

## 2016-11-03 DIAGNOSIS — C9110 Chronic lymphocytic leukemia of B-cell type not having achieved remission: Secondary | ICD-10-CM

## 2016-11-03 DIAGNOSIS — C179 Malignant neoplasm of small intestine, unspecified: Secondary | ICD-10-CM

## 2016-11-03 DIAGNOSIS — D509 Iron deficiency anemia, unspecified: Secondary | ICD-10-CM | POA: Diagnosis not present

## 2016-11-03 DIAGNOSIS — C9190 Lymphoid leukemia, unspecified not having achieved remission: Secondary | ICD-10-CM | POA: Diagnosis not present

## 2016-11-03 LAB — CBC WITH DIFFERENTIAL (CANCER CENTER ONLY)
HCT: 38.6 % (ref 34.8–46.6)
HGB: 12.6 g/dL (ref 11.6–15.9)
MCH: 31.1 pg (ref 26.0–34.0)
MCHC: 32.6 g/dL (ref 32.0–36.0)
MCV: 95 fL (ref 81–101)
PLATELETS: 204 10*3/uL (ref 145–400)
RBC: 4.05 10*6/uL (ref 3.70–5.32)
RDW: 13.9 % (ref 11.1–15.7)
WBC: 30.7 10*3/uL — AB (ref 3.9–10.0)

## 2016-11-03 LAB — CMP (CANCER CENTER ONLY)
ALK PHOS: 112 U/L — AB (ref 26–84)
ALT: 29 U/L (ref 10–47)
AST: 33 U/L (ref 11–38)
Albumin: 3.7 g/dL (ref 3.3–5.5)
BUN: 25 mg/dL — AB (ref 7–22)
CO2: 27 mEq/L (ref 18–33)
Calcium: 10.1 mg/dL (ref 8.0–10.3)
Chloride: 110 mEq/L — ABNORMAL HIGH (ref 98–108)
Creat: 1.6 mg/dl — ABNORMAL HIGH (ref 0.6–1.2)
GLUCOSE: 95 mg/dL (ref 73–118)
POTASSIUM: 3.9 meq/L (ref 3.3–4.7)
Sodium: 147 mEq/L — ABNORMAL HIGH (ref 128–145)
Total Bilirubin: 0.6 mg/dl (ref 0.20–1.60)
Total Protein: 7.4 g/dL (ref 6.4–8.1)

## 2016-11-03 LAB — LACTATE DEHYDROGENASE: LDH: 194 U/L (ref 125–245)

## 2016-11-03 LAB — FERRITIN: Ferritin: 105 ng/ml (ref 9–269)

## 2016-11-03 LAB — IRON AND TIBC
%SAT: 26 % (ref 21–57)
IRON: 79 ug/dL (ref 41–142)
TIBC: 310 ug/dL (ref 236–444)
UIBC: 231 ug/dL (ref 120–384)

## 2016-11-03 LAB — MANUAL DIFFERENTIAL (CHCC SATELLITE)
ALC: 22.1 10*3/uL — AB (ref 0.6–2.2)
ANC (CHCC HP manual diff): 7 10*3/uL — ABNORMAL HIGH (ref 1.5–6.7)
BASO: 1 % (ref 0–2)
EOS: 1 % (ref 0–7)
LYMPH: 72 % — ABNORMAL HIGH (ref 14–48)
MONO: 3 % (ref 0–13)
PLT EST ~~LOC~~: ADEQUATE
SEG: 23 % — ABNORMAL LOW (ref 40–75)

## 2016-11-03 LAB — CHCC SATELLITE - SMEAR

## 2016-11-03 LAB — CEA (IN HOUSE-CHCC): CEA (CHCC-IN HOUSE): 1.2 ng/mL (ref 0.00–5.00)

## 2016-11-03 NOTE — Progress Notes (Signed)
Hematology and Oncology Follow Up Visit  [place] [id number] 04/01/1940 76 y.o. 11/03/2016   Principle Diagnosis:  CLL -stage A Remote history of colon cancer Iron deficiency anemia  Current Therapy:   IV iron as indicated-patient received a dose in September 2018   Interim History:  [name] is here today for her follow-up.  She is feeling better.  The IV iron that we gave her has helped.  When I saw her, her white cell count was up a little bit.  We went ahead and did CT scans of her neck down to her pelvis.  She had some small lymph nodes in the neck.  Nothing was over 5 mm.  She had no splenomegaly.  There is no bulky adenopathy.  She manages the [place] okay.  She did not lose power.  She has had no fever.  She has had no rashes.  She said that she developed an area on her lower legs that was erythematous.  It sounds like this may have been some transient vasculitis.  I know this can happen with CLL.  She has not noted any palpable lymph nodes.  There is been no change in bowel or bladder habits.    Overall, her performance status is ECOG 1.   Medications:  Allergies as of 11/03/2016      Reactions   Codeine    REACTION: makes her nervous, orTylenol #3   Ibuprofen    REACTION: nervous   Meperidine Hcl    REACTION: nasuea and vomitting   Naproxen Sodium    REACTION: nervous      Medication List       Accurate as of 11/03/16 11:21 AM. Always use your most recent med list.          amLODipine 5 MG tablet Commonly known as:  NORVASC Take 1 tablet (5 mg total) by mouth daily.   aspirin 81 MG EC tablet Take 1 tablet (81 mg total) by mouth daily.   atenolol 100 MG tablet Commonly known as:  TENORMIN Take 1 tablet (100 mg total) by mouth daily.   atorvastatin 20 MG tablet Commonly known as:  LIPITOR TAKE 1 TABLET(20 MG) BY MOUTH DAILY   azelastine 0.1 % nasal spray Commonly known as:  ASTELIN Place 2 sprays into both nostrils at bedtime as  needed for rhinitis. Use in each nostril as directed   cholecalciferol 1000 units tablet Commonly known as:  VITAMIN D Take 1,000 Units by mouth daily.   clopidogrel 75 MG tablet Commonly known as:  PLAVIX Take 1 tablet (75 mg total) by mouth daily.   clorazepate 7.5 MG tablet Commonly known as:  TRANXENE Take 1 tablet (7.5 mg total) by mouth as needed.   docusate sodium 100 MG capsule Commonly known as:  COLACE Take 200 mg by mouth daily as needed for mild constipation.   fluticasone 50 MCG/ACT nasal spray Commonly known as:  FLONASE Place 2 sprays into both nostrils daily as needed for allergies or rhinitis.   HYDROcodone-acetaminophen 5-325 MG tablet Commonly known as:  NORCO/VICODIN Take 1 tablet by mouth 3 (three) times daily as needed for moderate pain.   insulin NPH Human 100 UNIT/ML injection Commonly known as:  HUMULIN N,NOVOLIN N 20-38 Units 2 (two) times daily before a meal. Inject 38 units into the skin every morning and inject 20 units into the skin at bedtime.   insulin regular 100 units/mL injection Commonly known as:  NOVOLIN R,HUMULIN R Inject 16 Units  into the skin 2 (two) times daily before a meal.   INSULIN SYRINGE .5CC/31GX5/16" 31G X 5/16" 0.5 ML Misc USE TO INJECT INSULIN 5 TIMES PER DAY   irbesartan 300 MG tablet Commonly known as:  AVAPRO TAKE 1 TABLET BY MOUTH DAILY   isosorbide mononitrate 30 MG 24 hr tablet Commonly known as:  IMDUR TAKE 1 TABLET(30 MG) BY MOUTH DAILY   liraglutide 18 MG/3ML Sopn Commonly known as:  VICTOZA ADMINISTER 1.2 MG UNDER THE SKIN DAILY   metFORMIN 500 MG tablet Commonly known as:  GLUCOPHAGE TAKE 1 TABLET BY MOUTH EVERY MORNING AND 2 TABLETS EVERY EVENING   mometasone 0.1 % cream Commonly known as:  ELOCON Use as directed   multivitamin capsule Take 1 capsule by mouth daily.   neomycin-polymyxin-hydrocortisone OTIC solution Commonly known as:  CORTISPORIN 2-3 drops in each ear three times daily     omeprazole 40 MG capsule Commonly known as:  PRILOSEC TAKE 1 CAPSULE BY MOUTH DAILY   ONE TOUCH ULTRA SYSTEM KIT w/Device Kit 1 kit by Does not apply route once.   ONE TOUCH ULTRA TEST test strip Generic drug:  glucose blood USE TO CHECK BLOOD SUGARS TWICE DAILY AS DIRECTED       Allergies:  Allergies  Allergen Reactions  . Codeine     REACTION: makes her nervous, orTylenol #3  . Ibuprofen     REACTION: nervous  . Meperidine Hcl     REACTION: nasuea and vomitting  . Naproxen Sodium     REACTION: nervous    Past Medical History, Surgical history, Social history, and Family History were reviewed and updated.  Review of Systems: As stated in the interim history  Physical Exam:  vitals were not taken for this visit.  Wt Readings from Last 3 Encounters:  10/20/16 172 lb 9.6 oz (78.3 kg)  10/12/16 173 lb 4 oz (78.6 kg)  09/19/16 170 lb 12.8 oz (77.5 kg)    I examined Ms. Production manager.  Results of my examination are noted below: Well-developed but elderly white female in no obvious distress.  Her vital signs show a temperature of 97.8.  Pulse 73.  Blood pressure 168/72.  Weight is 175 pounds.  Head and neck exam shows no ocular or oral lesions. She has no obvious adenopathy in the neck. Lungs are clear bilaterally. Cardiac exam regular rate and rhythm with no murmurs, rubs or bruits. Axillary exam shows no obvious bilateral axillary adenopathy. Abdomen is soft. She has decent bowel sounds. There is no fluid wave. There is no palpable liver or spleen tip. Back exam shows no tenderness over the spine, ribs or hips. Extremities shows no clubbing, cyanosis or edema. Neurological exam shows no focal neurological deficits. Skin exam shows no rashes, ecchymoses or petechia. red   Lab Results  Component Value Date   WBC 30.7 (H) 11/03/2016   HGB 12.6 11/03/2016   HCT 38.6 11/03/2016   MCV 95 11/03/2016   PLT 204 11/03/2016   Lab Results  Component Value Date   FERRITIN 118  10/12/2016   IRON 90 10/12/2016   TIBC 291 10/12/2016   UIBC 201 10/12/2016   IRONPCTSAT 31 10/12/2016   Lab Results  Component Value Date   RBC 4.05 11/03/2016   No results found for: Nils Pyle, [facility] Lab Results  Component Value Date   IGGSERUM 1,030 02/25/2014   IGA 281 02/25/2014   IGMSERUM 41 (L) 02/25/2014   Lab Results  Component Value Date   TOTALPROTELP 7.2  02/25/2014   ALBUMINELP 54.3 (L) 02/25/2014   A1GS 4.4 02/25/2014   A2GS 13.7 (H) 02/25/2014   BETS 7.0 02/25/2014   BETA2SER 6.2 02/25/2014   GAMS 14.4 02/25/2014   MSPIKE NOT DET 02/25/2014   SPEI * 02/25/2014     Chemistry      Component Value Date/Time   NA 147 (H) 11/03/2016 1018   NA 143 08/30/2016 0954   K 3.9 11/03/2016 1018   K 4.5 08/30/2016 0954   CL 110 (H) 11/03/2016 1018   CO2 27 11/03/2016 1018   CO2 24 08/30/2016 0954   BUN 25 (H) 11/03/2016 1018   BUN 20.5 08/30/2016 0954   CREATININE 1.6 (H) 11/03/2016 1018   CREATININE 1.4 (H) 08/30/2016 0954      Component Value Date/Time   CALCIUM 10.1 11/03/2016 1018   CALCIUM 9.8 08/30/2016 0954   ALKPHOS 112 (H) 11/03/2016 1018   ALKPHOS 113 08/30/2016 0954   AST 33 11/03/2016 1018   AST 23 08/30/2016 0954   ALT 29 11/03/2016 1018   ALT 20 08/30/2016 0954   BILITOT 0.60 11/03/2016 1018   BILITOT 0.38 08/30/2016 0954      Impression and Plan: [name] is a 76 yo caucasian female with stage A CLL.   I am happy that her white cell count has come down a little bit.  I am also quite pleased that her CT scans really did not show any issues with adenopathy.  I think we can now get her back in 4 months.  We will get her through the holidays and get her through the wintertime.  I went over her lab work with her.  I went over the x-rays with her.    I spent about 40 minutes with her today.    [name], MD 11/2/201811:21 AM

## 2016-11-04 LAB — IGG, IGA, IGM
IGA/IMMUNOGLOBULIN A, SERUM: 243 mg/dL (ref 64–422)
IGM (IMMUNOGLOBIN M), SRM: 27 mg/dL (ref 26–217)
IgG, Qn, Serum: 844 mg/dL (ref 700–1600)

## 2016-11-04 LAB — BETA 2 MICROGLOBULIN, SERUM: Beta-2: 2.8 mg/L — ABNORMAL HIGH (ref 0.6–2.4)

## 2016-11-06 ENCOUNTER — Telehealth: Payer: Self-pay

## 2016-11-06 LAB — KAPPA/LAMBDA LIGHT CHAINS
IG KAPPA FREE LIGHT CHAIN: 33.5 mg/L — AB (ref 3.3–19.4)
IG LAMBDA FREE LIGHT CHAIN: 27.4 mg/L — AB (ref 5.7–26.3)
KAPPA/LAMBDA FLC RATIO: 1.22 (ref 0.26–1.65)

## 2016-11-06 NOTE — Telephone Encounter (Addendum)
  Call - the iron level is ok!!  [name]  Above message given to pt via phone. Pt verbalizes understanding and appreciation. dph

## 2016-11-08 LAB — PROTEIN ELECTROPHORESIS, SERUM, WITH REFLEX
A/G Ratio: 1.2 (ref 0.7–1.7)
ALPHA 1: 0.2 g/dL (ref 0.0–0.4)
ALPHA 2: 1 g/dL (ref 0.4–1.0)
Albumin: 3.7 g/dL (ref 2.9–4.4)
Beta: 1.1 g/dL (ref 0.7–1.3)
GAMMA GLOBULIN: 0.8 g/dL (ref 0.4–1.8)
Globulin, Total: 3 g/dL (ref 2.2–3.9)
Total Protein: 6.7 g/dL (ref 6.0–8.5)

## 2016-11-13 ENCOUNTER — Other Ambulatory Visit: Payer: Self-pay | Admitting: Cardiology

## 2016-11-13 NOTE — Telephone Encounter (Signed)
REFILL 

## 2016-11-24 ENCOUNTER — Other Ambulatory Visit: Payer: Self-pay | Admitting: Endocrinology

## 2016-11-27 ENCOUNTER — Emergency Department (HOSPITAL_COMMUNITY): Payer: No Typology Code available for payment source

## 2016-11-27 ENCOUNTER — Other Ambulatory Visit: Payer: Self-pay | Admitting: Endocrinology

## 2016-11-27 ENCOUNTER — Emergency Department (HOSPITAL_COMMUNITY)
Admission: EM | Admit: 2016-11-27 | Discharge: 2016-11-27 | Disposition: A | Discharge status: Home / Self Care | Payer: No Typology Code available for payment source | Attending: Emergency Medicine | Admitting: Emergency Medicine

## 2016-11-27 ENCOUNTER — Encounter (HOSPITAL_COMMUNITY): Payer: Self-pay | Admitting: Emergency Medicine

## 2016-11-27 DIAGNOSIS — S59902A Unspecified injury of left elbow, initial encounter: Secondary | ICD-10-CM | POA: Diagnosis not present

## 2016-11-27 DIAGNOSIS — Z951 Presence of aortocoronary bypass graft: Secondary | ICD-10-CM | POA: Insufficient documentation

## 2016-11-27 DIAGNOSIS — N183 Chronic kidney disease, stage 3 (moderate): Secondary | ICD-10-CM | POA: Insufficient documentation

## 2016-11-27 DIAGNOSIS — I2510 Atherosclerotic heart disease of native coronary artery without angina pectoris: Secondary | ICD-10-CM | POA: Insufficient documentation

## 2016-11-27 DIAGNOSIS — M25522 Pain in left elbow: Secondary | ICD-10-CM | POA: Diagnosis not present

## 2016-11-27 DIAGNOSIS — E1122 Type 2 diabetes mellitus with diabetic chronic kidney disease: Secondary | ICD-10-CM | POA: Insufficient documentation

## 2016-11-27 DIAGNOSIS — M25512 Pain in left shoulder: Secondary | ICD-10-CM | POA: Diagnosis not present

## 2016-11-27 DIAGNOSIS — Z96652 Presence of left artificial knee joint: Secondary | ICD-10-CM | POA: Diagnosis not present

## 2016-11-27 DIAGNOSIS — S4992XA Unspecified injury of left shoulder and upper arm, initial encounter: Secondary | ICD-10-CM | POA: Diagnosis not present

## 2016-11-27 DIAGNOSIS — I129 Hypertensive chronic kidney disease with stage 1 through stage 4 chronic kidney disease, or unspecified chronic kidney disease: Secondary | ICD-10-CM | POA: Insufficient documentation

## 2016-11-27 DIAGNOSIS — M79602 Pain in left arm: Secondary | ICD-10-CM | POA: Diagnosis not present
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
## 2016-11-27 NOTE — Discharge Instructions (Signed)
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
## 2016-12-01 ENCOUNTER — Other Ambulatory Visit: Payer: Self-pay | Admitting: Cardiovascular Disease

## 2016-12-01 ENCOUNTER — Other Ambulatory Visit: Payer: Self-pay | Admitting: Internal Medicine

## 2016-12-04 ENCOUNTER — Encounter: Payer: Self-pay | Admitting: Internal Medicine

## 2016-12-04 ENCOUNTER — Ambulatory Visit (INDEPENDENT_AMBULATORY_CARE_PROVIDER_SITE_OTHER): Payer: Medicare Other | Admitting: Internal Medicine

## 2016-12-04 VITALS — BP 132/80 | HR 81 | Temp 98.1°F | Resp 14 | Ht 66.0 in | Wt 175.0 lb

## 2016-12-04 DIAGNOSIS — M150 Primary generalized (osteo)arthritis: Secondary | ICD-10-CM

## 2016-12-04 DIAGNOSIS — I10 Essential (primary) hypertension: Secondary | ICD-10-CM | POA: Diagnosis not present

## 2016-12-04 DIAGNOSIS — C9190 Lymphoid leukemia, unspecified not having achieved remission: Secondary | ICD-10-CM | POA: Diagnosis not present

## 2016-12-04 DIAGNOSIS — C9110 Chronic lymphocytic leukemia of B-cell type not having achieved remission: Secondary | ICD-10-CM

## 2016-12-04 DIAGNOSIS — Z79899 Other long term (current) drug therapy: Secondary | ICD-10-CM | POA: Diagnosis not present

## 2016-12-04 DIAGNOSIS — I208 Other forms of angina pectoris: Secondary | ICD-10-CM

## 2016-12-04 DIAGNOSIS — F419 Anxiety disorder, unspecified: Secondary | ICD-10-CM | POA: Diagnosis not present

## 2016-12-04 DIAGNOSIS — M159 Polyosteoarthritis, unspecified: Secondary | ICD-10-CM

## 2016-12-04 MED ORDER — HYDROCODONE-ACETAMINOPHEN 5-325 MG PO TABS: 1.0000 | ORAL_TABLET | Freq: Three times a day (TID) | ORAL | 0 refills | Status: DC | PRN
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
## 2016-12-04 NOTE — Progress Notes (Signed)
Pre visit review using our clinic review tool, if applicable. No additional management support is needed unless otherwise documented below in the visit note. 

## 2016-12-04 NOTE — Patient Instructions (Addendum)
GO TO THE LAB : Please provide a urine sample  GO TO THE FRONT DESK Schedule your next appointment for a yearly checkup in 4-5 months.  Fasting   Rest, fluids , tylenol  For cough:  Take Mucinex DM twice a day as needed until better  For nasal congestion: Use OTC Nasocort or Flonase : 2 nasal sprays on each side of the nose in the morning until you feel better    Call if not gradually better over the next  10 days  Call anytime if the symptoms are severe
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
## 2016-12-09 LAB — PAIN MGMT, PROFILE 8 W/CONF, U
6 Acetylmorphine: NEGATIVE ng/mL (ref <10)
ALCOHOL METABOLITES: NEGATIVE ng/mL (ref <500)
AMINOCLONAZEPAM: NEGATIVE ng/mL (ref <25)
AMPHETAMINES: NEGATIVE ng/mL (ref <500)
Alphahydroxyalprazolam: NEGATIVE ng/mL (ref <25)
Alphahydroxymidazolam: NEGATIVE ng/mL (ref <50)
Alphahydroxytriazolam: NEGATIVE ng/mL (ref <50)
BUPRENORPHINE, URINE: NEGATIVE ng/mL (ref <5)
Benzodiazepines: NEGATIVE ng/mL (ref <100)
COCAINE METABOLITE: NEGATIVE ng/mL (ref <150)
CODEINE: NEGATIVE ng/mL (ref <50)
Creatinine: 35.3 mg/dL
HYDROMORPHONE: 61 ng/mL — AB (ref <50)
HYDROXYETHYLFLURAZEPAM: NEGATIVE ng/mL (ref <50)
Hydrocodone: NEGATIVE ng/mL (ref <50)
LORAZEPAM: NEGATIVE ng/mL (ref <50)
MARIJUANA METABOLITE: NEGATIVE ng/mL (ref <20)
MDMA: NEGATIVE ng/mL (ref <500)
MORPHINE: NEGATIVE ng/mL (ref <50)
Nordiazepam: NEGATIVE ng/mL (ref <50)
Norhydrocodone: NEGATIVE ng/mL (ref <50)
Opiates: POSITIVE ng/mL — AB (ref <100)
Oxazepam: NEGATIVE ng/mL (ref <50)
Oxidant: NEGATIVE ug/mL (ref <200)
Oxycodone: NEGATIVE ng/mL (ref <100)
TEMAZEPAM: NEGATIVE ng/mL (ref <50)
pH: 5.97 (ref 4.5–9.0)

## 2016-12-10 ENCOUNTER — Other Ambulatory Visit: Payer: Self-pay | Admitting: Gastroenterology

## 2016-12-14 ENCOUNTER — Telehealth: Payer: Self-pay | Admitting: Endocrinology

## 2016-12-15 ENCOUNTER — Other Ambulatory Visit: Payer: Medicare Other

## 2016-12-18 ENCOUNTER — Other Ambulatory Visit: Payer: Self-pay | Admitting: Internal Medicine

## 2016-12-18 DIAGNOSIS — Z1231 Encounter for screening mammogram for malignant neoplasm of breast: Secondary | ICD-10-CM

## 2016-12-19 ENCOUNTER — Ambulatory Visit: Payer: Medicare Other | Admitting: Endocrinology

## 2017-01-11 ENCOUNTER — Other Ambulatory Visit: Payer: Self-pay | Admitting: Endocrinology

## 2017-01-16 ENCOUNTER — Ambulatory Visit
Admission: RE | Admit: 2017-01-16 | Discharge: 2017-01-16 | Disposition: A | Discharge status: Home / Self Care | Payer: Medicare Other | Source: Ambulatory Visit | Attending: Internal Medicine | Admitting: Internal Medicine

## 2017-01-16 DIAGNOSIS — Z1231 Encounter for screening mammogram for malignant neoplasm of breast: Secondary | ICD-10-CM | POA: Diagnosis not present

## 2017-01-26 ENCOUNTER — Other Ambulatory Visit (INDEPENDENT_AMBULATORY_CARE_PROVIDER_SITE_OTHER): Payer: Medicare Other

## 2017-01-26 DIAGNOSIS — Z794 Long term (current) use of insulin: Secondary | ICD-10-CM | POA: Diagnosis not present

## 2017-01-26 DIAGNOSIS — E1165 Type 2 diabetes mellitus with hyperglycemia: Secondary | ICD-10-CM

## 2017-01-26 LAB — BASIC METABOLIC PANEL
BUN: 25 mg/dL — ABNORMAL HIGH (ref 6–23)
CHLORIDE: 105 meq/L (ref 96–112)
CO2: 27 meq/L (ref 19–32)
CREATININE: 1.39 mg/dL — AB (ref 0.40–1.20)
Calcium: 9.4 mg/dL (ref 8.4–10.5)
GFR: 39.04 mL/min — ABNORMAL LOW (ref >60.00)
Glucose, Bld: 184 mg/dL — ABNORMAL HIGH (ref 70–99)
POTASSIUM: 4.5 meq/L (ref 3.5–5.1)
SODIUM: 139 meq/L (ref 135–145)

## 2017-01-26 LAB — MICROALBUMIN / CREATININE URINE RATIO
CREATININE, U: 107 mg/dL
MICROALB/CREAT RATIO: 30.7 mg/g — AB (ref 0.0–30.0)
Microalb, Ur: 32.8 mg/dL — ABNORMAL HIGH (ref 0.0–1.9)

## 2017-01-26 LAB — HEMOGLOBIN A1C: HEMOGLOBIN A1C: 6.5 % (ref 4.6–6.5)

## 2017-01-31 ENCOUNTER — Other Ambulatory Visit: Payer: Self-pay | Admitting: Endocrinology

## 2017-01-31 ENCOUNTER — Encounter: Payer: Self-pay | Admitting: Endocrinology

## 2017-01-31 ENCOUNTER — Ambulatory Visit (INDEPENDENT_AMBULATORY_CARE_PROVIDER_SITE_OTHER): Payer: Medicare Other | Admitting: Endocrinology

## 2017-01-31 VITALS — BP 133/71 | HR 73 | Ht 66.0 in | Wt 175.0 lb

## 2017-01-31 DIAGNOSIS — R809 Proteinuria, unspecified: Secondary | ICD-10-CM | POA: Diagnosis not present

## 2017-01-31 DIAGNOSIS — E1129 Type 2 diabetes mellitus with other diabetic kidney complication: Secondary | ICD-10-CM

## 2017-01-31 DIAGNOSIS — Z794 Long term (current) use of insulin: Secondary | ICD-10-CM | POA: Diagnosis not present

## 2017-01-31 DIAGNOSIS — E1165 Type 2 diabetes mellitus with hyperglycemia: Secondary | ICD-10-CM

## 2017-01-31 NOTE — Patient Instructions (Signed)
Check blood sugars on waking up  3/7  Also check blood sugars about 2 hours after a meal and do this after different meals by rotation  Recommended blood sugar levels on waking up is 90-130 and about 2 hours after meal is 130-180  Please bring your blood sugar monitor to each visit, thank you

## 2017-01-31 NOTE — Progress Notes (Signed)
Patient ID: [name], female   DOB: 02/01/1940, 77 y.o.   MRN: [id number]            Reason for Appointment: Followup for Type 2 Diabetes   History of Present Illness:          Diagnosis: Type 2 diabetes mellitus, date of diagnosis: 1998       Past history:   She was treated with metformin at diagnosis when this had been continued until 2015 Subsequently Amaryl was also added several years ago and this has been continued Her blood sugars were under fair control between 2010 and early 2013 with A1c ranging from 7.4-9.4, mostly under 8% However since 08/2011 her A1c has been mostly over 8%  Insulin was added in 2014 with small doses of Lantus and this has been progressively increased She was started on mealtime insulin on her initial consultation in 9/15 because of high postprandial readings, sometimes over 300 With adding Victoza in 02/2014 her blood sugars were somewhat better with A1c coming down below 8%  Recent history:   INSULIN regimen: Regular 16 units before meals.  NPH 38 units in the morning and 20 hs  Non-insulin hypoglycemic drugs the patient is taking are:  Metformin 500 a.m., 1000 mg p.m. and Victoza 1.2 mg daily  A1c has been consistently under 7, now 6.5  Current blood sugar patterns, daily management and problems identified:  Fasting blood sugars: Recently her morning blood sugars are variably high; difficult to know which readings are before eating although he does not think she is eating a meal until late morning.  Lowest blood sugar 83 around midday  Postprandial readings:  She does check her readings after supper These are relatively well controlled ith probably an average about 140 and highest 185 Not clear if she is checking her blood sugar after her first meal which is mostly a yogurt in the morning   Insulin regimen:  She is taking 38 units of NPH in the morning and 20 at bedtime  However even though she is not eating much in the morning she  is still taking 16 units of regular insulin  Discussed does not appear to cause hypoglycemia  Her mealtimes and food intake during the day are quite variable  Does not adjust the dose based on what she is eating      HYPOGLYCEMIA: None including overnight   Glucose monitoring:  done 1-2 times a day         Glucometer: One Touch.      Blood Glucose readings by time of day from download:  Mean values apply above for all meters except median for One Touch  PRE-MEAL 9-11 AM Lunch Dinner Bedtime Overall  Glucose range: 105-209 83-184     Mean/median: 160 154   54+/-26   POST-MEAL PC Breakfast PC Lunch PC Dinner  Glucose range:   112-185  Mean/median:   139     Self-care: The diet that the patient has been following is: none, usually eating low fat meals Meals: 2-3 meals per day at irregular times.   Bfst skipped, Dinner 6-7 pm  Breakfast is  sometimes crackers  cheese toast or oatmeal, yogurt, occ. eating sandwiches for meals, snacks will be with crackers          Dietician visit, most recent: never.  She saw the CDE in 02/2014              Exercise: none   Weight history: 150-170 previously  Wt Readings from Last 3 Encounters:  01/31/17 175 lb (79.4 kg)  12/04/16 175 lb (79.4 kg)  11/03/16 175 lb (79.4 kg)    Glycemic control:   Lab Results  Component Value Date   HGBA1C 6.5 01/26/2017   HGBA1C 5.7 09/19/2016   HGBA1C 6.8 (H) 05/26/2016   Lab Results  Component Value Date   MICROALBUR 32.8 (H) 01/26/2017   LDLCALC 45 07/27/2015   CREATININE 1.39 (H) 01/26/2017     OTHER active problems  discussed in review of systems   Allergies as of 01/31/2017      Reactions   Codeine    REACTION: makes her nervous, orTylenol #3   Ibuprofen    REACTION: nervous   Meperidine Hcl    REACTION: nasuea and vomitting   Naproxen Sodium    REACTION: nervous      Medication List        Accurate as of 01/31/17  3:07 PM. Always use your most recent med list.            amLODipine 5 MG tablet Commonly known as:  NORVASC TAKE 1 TABLET(5 MG) BY MOUTH DAILY   aspirin 81 MG EC tablet Take 1 tablet (81 mg total) by mouth daily.   atenolol 100 MG tablet Commonly known as:  TENORMIN Take 1 tablet (100 mg total) by mouth daily.   atorvastatin 20 MG tablet Commonly known as:  LIPITOR TAKE 1 TABLET(20 MG) BY MOUTH DAILY   azelastine 0.1 % nasal spray Commonly known as:  ASTELIN Place 2 sprays into both nostrils at bedtime as needed for rhinitis. Use in each nostril as directed   cholecalciferol 1000 units tablet Commonly known as:  VITAMIN D Take 1,000 Units by mouth daily.   clopidogrel 75 MG tablet Commonly known as:  PLAVIX Take 1 tablet (75 mg total) by mouth daily.   clorazepate 7.5 MG tablet Commonly known as:  TRANXENE Take 1 tablet (7.5 mg total) by mouth as needed.   docusate sodium 100 MG capsule Commonly known as:  COLACE Take 200 mg by mouth daily as needed for mild constipation.   fluticasone 50 MCG/ACT nasal spray Commonly known as:  FLONASE Place 2 sprays into both nostrils daily as needed for allergies or rhinitis.   HYDROcodone-acetaminophen 5-325 MG tablet Commonly known as:  NORCO/VICODIN Take 1 tablet by mouth 3 (three) times daily as needed for moderate pain.   HYDROcodone-acetaminophen 5-325 MG tablet Commonly known as:  NORCO/VICODIN Take 1 tablet by mouth 3 (three) times daily as needed for moderate pain.   insulin NPH Human 100 UNIT/ML injection Commonly known as:  HUMULIN N,NOVOLIN N 20-38 Units 2 (two) times daily before a meal. Inject 38 units into the skin every morning and inject 20 units into the skin at bedtime.   insulin regular 100 units/mL injection Commonly known as:  NOVOLIN R,HUMULIN R Inject 16 Units into the skin 2 (two) times daily before a meal.   INSULIN SYRINGE .5CC/31GX5/16" 31G X 5/16" 0.5 ML Misc USE TO INJECT INSULIN 5 TIMES PER DAY   irbesartan 300 MG tablet Commonly known as:   AVAPRO Take 1 tablet (300 mg total) by mouth daily.   isosorbide mononitrate 30 MG 24 hr tablet Commonly known as:  IMDUR TAKE 1 TABLET(30 MG) BY MOUTH DAILY   liraglutide 18 MG/3ML Sopn Commonly known as:  VICTOZA ADMINISTER 1.2 MG UNDER THE SKIN DAILY   metFORMIN 500 MG tablet Commonly known as:  GLUCOPHAGE TAKE 1 TABLET  BY MOUTH EVERY MORNING AND 2 TABLETS EVERY EVENING   mometasone 0.1 % cream Commonly known as:  ELOCON Use as directed   multivitamin capsule Take 1 capsule by mouth daily.   neomycin-polymyxin-hydrocortisone OTIC solution Commonly known as:  CORTISPORIN 2-3 drops in each ear three times daily   omeprazole 40 MG capsule Commonly known as:  PRILOSEC TAKE 1 CAPSULE BY MOUTH DAILY   ONE TOUCH ULTRA SYSTEM KIT w/Device Kit 1 kit by Does not apply route once.   ONE TOUCH ULTRA TEST test strip Generic drug:  glucose blood TEST BLOOD SUGAR TWICE DAILY AS DIRECTED       Allergies:  Allergies  Allergen Reactions  . Codeine     REACTION: makes her nervous, orTylenol #3  . Ibuprofen     REACTION: nervous  . Meperidine Hcl     REACTION: nasuea and vomitting  . Naproxen Sodium     REACTION: nervous    Past Medical History:  Diagnosis Date  . Acute blood loss anemia 09/17/2015  . Anemia   . Anginal pain ([place]) 2017  . Arthritis    "hands and knees mostly" (09/16/2015)  . B12 deficiency anemia   . CAD (coronary artery disease)    Dr Gwenlyn Found  . 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 Depression   . DJD (degenerative joint disease)   . 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 Gastritis   . GERD (gastroesophageal reflux disease)   . Headache(784.0)   . History of blood transfusion    "I"ve had 20 some; thru birth of children, loss of blood, last 2 were in ~ 1999 before my cancer surgery" (09/16/2015)  . History of hiatal hernia   . History of shingles   . Hx of colonic polyps   . Hyperlipidemia   . Hypertension   .  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 Pneumonia    X 1  . Postoperative hematoma involving circulatory system following cardiac catheterization 09/17/2015  . S/P cardiac cath 09/16/15 09/17/2015  . Type II diabetes mellitus ([place])   . 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 Venous insufficiency     Past Surgical History:  Procedure Laterality Date  . CARDIAC CATHETERIZATION  1997; 2008; 09/16/2015  . CARDIAC CATHETERIZATION N/A 09/16/2015   Procedure: Right/Left Heart Cath and Coronary/Graft Angiography;  Surgeon: Lorretta Harp, MD;  Location: [facility];  Service: Cardiovascular;  Laterality: N/A;  . CESAREAN SECTION  1966  . CORONARY ARTERY BYPASS GRAFT  1997   CABG X5  . EYE SURGERY     2 laser surgeries on left eye with implant  . EYE SURGERY Bilateral    cataracts  . FRACTURE SURGERY    . HERNIA REPAIR    . LAPAROSCOPIC ASSISTED VENTRAL HERNIA REPAIR  09/2008    with incarcerated colon;  Dr. Lucia Gaskins  . MOHS SURGERY  06/2016   BCC  . PATELLA FRACTURE SURGERY Left 11/1994   "crushed my knee"; put in a plate & 6 screws"  . REFRACTIVE SURGERY Left 07/30/2015  . 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 March Rummage  . SHOULDER ARTHROSCOPY W/ ROTATOR CUFF REPAIR Right 1997  . TONSILLECTOMY  1960s  . TOTAL KNEE ARTHROPLASTY WITH HARDWARE REMOVAL Left 12/1994    (infex, hardware removed )  . TUBAL LIGATION  1966    Family History  Problem Relation Age of Onset  .  Heart disease Mother 39  . Heart disease Father 21       MI  . Hypertension Child   . Heart attack Child   . Heart disease Maternal Aunt        x 2, all deceased  . Heart disease Maternal Uncle        x 4, all deceased  . Emphysema Brother 38  . Colon cancer Neg Hx   . Breast cancer Neg Hx     Social History:  reports that  has never smoked. she has never used smokeless tobacco. She reports that she does not drink alcohol or use drugs.    Review of Systems           Lipids: Has had excellent control of hypercholesterolemia with long-term use of Lipitor She has a history of coronary bypass surgery.   Also has been  on Niaspan, Prescribed by PCP       Lab Results  Component Value Date   CHOL 104 05/26/2016   HDL 30.90 (L) 05/26/2016   LDLCALC 45 07/27/2015   LDLDIRECT 36.0 05/26/2016   TRIG 257.0 (H) 05/26/2016   CHOLHDL 3 05/26/2016                  The blood pressure has been Treated with atenolol 100 mg and currently on half of 300 mg Avapro because of renal dysfunction  Also on amlodipine   No recent edema Not checking at home  Also does have borderline high microalbumin level  BP Readings from Last 3 Encounters:  01/31/17 133/71  12/04/16 132/80  11/27/16 (!) 149/76     Lab Results  Component Value Date   CREATININE 1.39 (H) 01/26/2017   BUN 25 (H) 01/26/2017   NA 139 01/26/2017   K 4.5 01/26/2017   CL 105 01/26/2017   CO2 27 01/26/2017    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 creatinine Is upper normal usually, Slightly better now No history of hyperkalemia    Lab Results  Component Value Date   CREATININE 1.39 (H) 01/26/2017   CREATININE 1.6 (H) 11/03/2016   CREATININE 1.5 (H) 10/12/2016       No history of Numbness, tingling or burning in feet   Diabetic foot exam Done in 5/18 showing mild neuropathy    Physical Examination:  BP 133/71 (BP Location: Left Arm, Patient Position: Sitting, Cuff Size: Large)   Pulse 73   Ht _0  (1.676 m)   Wt 175 lb (79.4 kg)   BMI 28.25 kg/m    No edema present   Diabetes type 2, uncontrolled with BMI 27   See history of present illness for detailed discussion of her current blood sugar patterns, management and problems identified  Her A1c is excellent at 6.5 This is despite her blood sugars being mostly high in the morning hours She does not have much post prandial hyperglycemia after supper which is her main meal  She does appear to have variable intake in the morning and  early afternoon hours and not at consistent meals Probably does benefit from continuing Victoza also No hypoglycemia with lowest blood sugar 83   RENAL insufficiency: Her creatinine is better  HYPERTENSION: Blood pressure is better controlled now She can continue amlodipine that was added Although she does have of slightly high microalbumin not able to increase her Avapro as yet because of tendency to high creatinine May do this if her blood pressure goes up  Skin rash on leg:She will discuss with PCP  PLAN:    she needs to label her blood sugars with postprandial readings marked as shown today  She needs to check blood sugars early in the morning when she wakes up and then some readings after her breakfast on other days  If her blood sugars are lower in the afternoon she can reduce her morning regular insulin 2-4 units  No change in NPH at this time or Victoza     Elayne Snare 01/31/2017, 3:07 PM   Note: This office note was prepared with Dragon voice recognition system technology. Any transcriptional errors that result from this process are unintentional.

## 2017-02-08 ENCOUNTER — Other Ambulatory Visit: Payer: Self-pay | Admitting: Endocrinology

## 2017-02-08 ENCOUNTER — Other Ambulatory Visit: Payer: Self-pay | Admitting: Internal Medicine

## 2017-02-20 DIAGNOSIS — E113311 Type 2 diabetes mellitus with moderate nonproliferative diabetic retinopathy with macular edema, right eye: Secondary | ICD-10-CM | POA: Diagnosis not present

## 2017-02-20 DIAGNOSIS — E113212 Type 2 diabetes mellitus with mild nonproliferative diabetic retinopathy with macular edema, left eye: Secondary | ICD-10-CM | POA: Diagnosis not present

## 2017-02-20 DIAGNOSIS — Q141 Congenital malformation of retina: Secondary | ICD-10-CM | POA: Diagnosis not present

## 2017-02-20 DIAGNOSIS — H43813 Vitreous degeneration, bilateral: Secondary | ICD-10-CM | POA: Diagnosis not present

## 2017-03-08 ENCOUNTER — Inpatient Hospital Stay: Payer: Medicare Other

## 2017-03-08 ENCOUNTER — Encounter: Payer: Self-pay | Admitting: Hematology & Oncology

## 2017-03-08 ENCOUNTER — Inpatient Hospital Stay: Payer: Medicare Other | Attending: Hematology & Oncology | Admitting: Hematology & Oncology

## 2017-03-08 ENCOUNTER — Other Ambulatory Visit: Payer: Self-pay

## 2017-03-08 VITALS — BP 146/80 | HR 70 | Temp 97.5°F | Resp 16 | Wt 174.0 lb

## 2017-03-08 DIAGNOSIS — Z794 Long term (current) use of insulin: Secondary | ICD-10-CM | POA: Insufficient documentation

## 2017-03-08 DIAGNOSIS — Z7982 Long term (current) use of aspirin: Secondary | ICD-10-CM | POA: Insufficient documentation

## 2017-03-08 DIAGNOSIS — Z85038 Personal history of other malignant neoplasm of large intestine: Secondary | ICD-10-CM | POA: Diagnosis not present

## 2017-03-08 DIAGNOSIS — C9110 Chronic lymphocytic leukemia of B-cell type not having achieved remission: Secondary | ICD-10-CM

## 2017-03-08 DIAGNOSIS — M79662 Pain in left lower leg: Secondary | ICD-10-CM | POA: Diagnosis not present

## 2017-03-08 DIAGNOSIS — E119 Type 2 diabetes mellitus without complications: Secondary | ICD-10-CM | POA: Diagnosis not present

## 2017-03-08 DIAGNOSIS — C9190 Lymphoid leukemia, unspecified not having achieved remission: Secondary | ICD-10-CM | POA: Diagnosis not present

## 2017-03-08 DIAGNOSIS — Z79899 Other long term (current) drug therapy: Secondary | ICD-10-CM | POA: Insufficient documentation

## 2017-03-08 DIAGNOSIS — D509 Iron deficiency anemia, unspecified: Secondary | ICD-10-CM | POA: Insufficient documentation

## 2017-03-08 LAB — CBC WITH DIFFERENTIAL (CANCER CENTER ONLY)
BASOS PCT: 0 %
Band Neutrophils: 0 %
Basophils Absolute: 0 10*3/uL (ref 0.0–0.1)
Blasts: 0 %
EOS PCT: 0 %
Eosinophils Absolute: 0 10*3/uL (ref 0.0–0.5)
HCT: 37.7 % (ref 34.8–46.6)
HEMOGLOBIN: 12.4 g/dL (ref 11.6–15.9)
LYMPHS ABS: 26.7 10*3/uL — AB (ref 0.9–3.3)
Lymphocytes Relative: 78 %
MCH: 31.4 pg (ref 26.0–34.0)
MCHC: 32.9 g/dL (ref 32.0–36.0)
MCV: 95.4 fL (ref 81.0–101.0)
MONO ABS: 2.4 10*3/uL — AB (ref 0.1–0.9)
MONOS PCT: 7 %
MYELOCYTES: 0 %
Metamyelocytes Relative: 0 %
NEUTROS ABS: 5.1 10*3/uL (ref 1.5–6.5)
NEUTROS PCT: 15 %
NRBC: 0 /100{WBCs}
Other: 0 %
PLATELETS: 210 10*3/uL (ref 145–400)
Promyelocytes Absolute: 0 %
RBC: 3.95 MIL/uL (ref 3.70–5.32)
RDW: 13.5 % (ref 11.1–15.7)
WBC: 34.2 10*3/uL — AB (ref 3.9–10.0)

## 2017-03-08 LAB — CMP (CANCER CENTER ONLY)
ALBUMIN: 3.8 g/dL (ref 3.5–5.0)
ALK PHOS: 103 U/L (ref 40–150)
ALT: 21 U/L (ref 0–55)
ANION GAP: 9 (ref 3–11)
AST: 25 U/L (ref 5–34)
BUN: 25 mg/dL (ref 7–26)
CALCIUM: 9.9 mg/dL (ref 8.4–10.4)
CHLORIDE: 108 mmol/L (ref 98–109)
CO2: 25 mmol/L (ref 22–29)
Creatinine: 1.42 mg/dL — ABNORMAL HIGH (ref 0.60–1.10)
GFR, EST AFRICAN AMERICAN: 40 mL/min — AB (ref >60)
GFR, Estimated: 35 mL/min — ABNORMAL LOW (ref >60)
GLUCOSE: 83 mg/dL (ref 70–140)
Potassium: 4.1 mmol/L (ref 3.5–5.1)
SODIUM: 142 mmol/L (ref 136–145)
Total Bilirubin: 0.5 mg/dL (ref 0.2–1.2)
Total Protein: 7 g/dL (ref 6.4–8.3)

## 2017-03-08 LAB — LACTATE DEHYDROGENASE: LDH: 181 U/L (ref 125–245)

## 2017-03-08 LAB — IRON AND TIBC
Iron: 69 ug/dL (ref 41–142)
Saturation Ratios: 22 % (ref 21–57)
TIBC: 308 ug/dL (ref 236–444)
UIBC: 239 ug/dL

## 2017-03-08 LAB — SAVE SMEAR

## 2017-03-08 LAB — FERRITIN: FERRITIN: 58 ng/mL (ref 9–269)

## 2017-03-08 NOTE — Progress Notes (Signed)
Hematology and Oncology Follow Up Visit  [place] [id number] 1940-06-08 77 y.o. 03/08/2017   Principle Diagnosis:  CLL -stage A Remote history of colon cancer Iron deficiency anemia  Current Therapy:   IV iron as indicated-patient received a dose in September 2018   Interim History:  Ms. [name] is here today for her follow-up.  We last saw her back in November.  Since then, she is been doing pretty well.  She is had no problems over the holidays.  A lot of her family was home.  She only has a complaint of some pain with the left lower leg.  She has a little bit of discomfort in the back of the left lower leg.  There is no swelling.  There is no obvious pain.  She has had no redness.  She is able to walk without difficulty.  She is diabetic.  She said that her last hemoglobin A1c was about 6.  She is had no fever.  She is had no cough or shortness of breath.  She is had no palpable lymph nodes.      Overall, her performance status is ECOG 1.   Medications:  Allergies as of 03/08/2017      Reactions   Codeine    REACTION: makes her nervous, orTylenol #3   Ibuprofen    REACTION: nervous   Meperidine Hcl    REACTION: nasuea and vomitting   Naproxen Sodium    REACTION: nervous      Medication List        Accurate as of 03/08/17 12:32 PM. Always use your most recent med list.          amLODipine 5 MG tablet Commonly known as:  NORVASC TAKE 1 TABLET(5 MG) BY MOUTH DAILY   aspirin 81 MG EC tablet Take 1 tablet (81 mg total) by mouth daily.   atenolol 100 MG tablet Commonly known as:  TENORMIN TAKE 1 TABLET(100 MG) BY MOUTH DAILY   atorvastatin 20 MG tablet Commonly known as:  LIPITOR TAKE 1 TABLET(20 MG) BY MOUTH DAILY   azelastine 0.1 % nasal spray Commonly known as:  ASTELIN Place 2 sprays into both nostrils at bedtime as needed for rhinitis. Use in each nostril as directed   cholecalciferol 1000 units tablet Commonly known as:  VITAMIN D Take 1,000  Units by mouth daily.   clopidogrel 75 MG tablet Commonly known as:  PLAVIX Take 1 tablet (75 mg total) by mouth daily.   clorazepate 7.5 MG tablet Commonly known as:  TRANXENE Take 1 tablet (7.5 mg total) by mouth as needed.   docusate sodium 100 MG capsule Commonly known as:  COLACE Take 200 mg by mouth daily as needed for mild constipation.   fluticasone 50 MCG/ACT nasal spray Commonly known as:  FLONASE Place 2 sprays into both nostrils daily as needed for allergies or rhinitis.   HYDROcodone-acetaminophen 5-325 MG tablet Commonly known as:  NORCO/VICODIN Take 1 tablet by mouth 3 (three) times daily as needed for moderate pain.   HYDROcodone-acetaminophen 5-325 MG tablet Commonly known as:  NORCO/VICODIN Take 1 tablet by mouth 3 (three) times daily as needed for moderate pain.   insulin NPH Human 100 UNIT/ML injection Commonly known as:  HUMULIN N,NOVOLIN N 20-38 Units 2 (two) times daily before a meal. Inject 38 units into the skin every morning and inject 20 units into the skin at bedtime.   insulin regular 100 units/mL injection Commonly known as:  NOVOLIN R,HUMULIN R  Inject 16 Units into the skin 2 (two) times daily before a meal.   INSULIN SYRINGE .5CC/31GX5/16" 31G X 5/16" 0.5 ML Misc USE TO INJECT INSULIN 5 TIMES PER DAY   irbesartan 300 MG tablet Commonly known as:  AVAPRO Take 1 tablet (300 mg total) by mouth daily.   isosorbide mononitrate 30 MG 24 hr tablet Commonly known as:  IMDUR TAKE 1 TABLET(30 MG) BY MOUTH DAILY   liraglutide 18 MG/3ML Sopn Commonly known as:  VICTOZA ADMINISTER 1.2 MG UNDER THE SKIN DAILY   metFORMIN 500 MG tablet Commonly known as:  GLUCOPHAGE TAKE 1 TABLET BY MOUTH EVERY MORNING AND 2 TABLETS EVERY EVENING   mometasone 0.1 % cream Commonly known as:  ELOCON Use as directed   multivitamin capsule Take 1 capsule by mouth daily.   neomycin-polymyxin-hydrocortisone OTIC solution Commonly known as:  CORTISPORIN 2-3  drops in each ear three times daily   omeprazole 40 MG capsule Commonly known as:  PRILOSEC TAKE 1 CAPSULE BY MOUTH DAILY   ONE TOUCH ULTRA SYSTEM KIT w/Device Kit 1 kit by Does not apply route once.   ONE TOUCH ULTRA TEST test strip Generic drug:  glucose blood TEST BLOOD SUGAR TWICE DAILY AS DIRECTED       Allergies:  Allergies  Allergen Reactions  . Codeine     REACTION: makes her nervous, orTylenol #3  . Ibuprofen     REACTION: nervous  . Meperidine Hcl     REACTION: nasuea and vomitting  . Naproxen Sodium     REACTION: nervous    Past Medical History, Surgical history, Social history, and Family History were reviewed and updated.  Review of Systems: Review of Systems  Constitutional: Negative.   HENT: Negative.   Eyes: Negative.   Respiratory: Negative.   Cardiovascular: Negative.   Gastrointestinal: Negative.   Genitourinary: Negative.   Musculoskeletal: Positive for myalgias.  Skin: Negative.   Neurological: Negative.   Endo/Heme/Allergies: Negative.   Psychiatric/Behavioral: Negative.     Physical Exam:  weight is 174 lb (78.9 kg). Her oral temperature is 97.5 F (36.4 C) (abnormal). Her blood pressure is 146/80 (abnormal) and her pulse is 70. Her respiration is 16 and oxygen saturation is 98%.   Wt Readings from Last 3 Encounters:  03/08/17 174 lb (78.9 kg)  01/31/17 175 lb (79.4 kg)  12/04/16 175 lb (79.4 kg)    Physical Exam  Constitutional: She is oriented to person, place, and time.  HENT:  Head: Normocephalic and atraumatic.  Mouth/Throat: Oropharynx is clear and moist.  Eyes: EOM are normal. Pupils are equal, round, and reactive to light.  Neck: Normal range of motion.  Cardiovascular: Normal rate, regular rhythm and normal heart sounds.  Pulmonary/Chest: Effort normal and breath sounds normal.  Abdominal: Soft. Bowel sounds are normal.  Musculoskeletal: Normal range of motion. She exhibits no edema, tenderness or deformity.    Lymphadenopathy:    She has no cervical adenopathy.  Neurological: She is alert and oriented to person, place, and time.  Skin: Skin is warm and dry. No rash noted. No erythema.  Psychiatric: She has a normal mood and affect. Her behavior is normal. Judgment and thought content normal.  Vitals reviewed.     Lab Results  Component Value Date   WBC 34.2 (H) 03/08/2017   HGB 12.6 11/03/2016   HCT 37.7 03/08/2017   MCV 95.4 03/08/2017   PLT 210 03/08/2017   Lab Results  Component Value Date   FERRITIN 105 11/03/2016  IRON 79 11/03/2016   TIBC 310 11/03/2016   UIBC 231 11/03/2016   IRONPCTSAT 26 11/03/2016   Lab Results  Component Value Date   RBC 3.95 03/08/2017   Lab Results  Component Value Date   KAPLAMBRATIO 1.22 11/03/2016   Lab Results  Component Value Date   IGGSERUM 844 11/03/2016   IGA 281 02/25/2014   IGMSERUM 27 11/03/2016   Lab Results  Component Value Date   TOTALPROTELP 7.2 02/25/2014   ALBUMINELP 54.3 (L) 02/25/2014   A1GS 4.4 02/25/2014   A2GS 13.7 (H) 02/25/2014   BETS 7.0 02/25/2014   BETA2SER 6.2 02/25/2014   GAMS 14.4 02/25/2014   MSPIKE Not Observed 11/03/2016   SPEI * 02/25/2014     Chemistry      Component Value Date/Time   NA 139 01/26/2017 1130   NA 147 (H) 11/03/2016 1018   NA 143 08/30/2016 0954   K 4.5 01/26/2017 1130   K 3.9 11/03/2016 1018   K 4.5 08/30/2016 0954   CL 105 01/26/2017 1130   CL 110 (H) 11/03/2016 1018   CO2 27 01/26/2017 1130   CO2 27 11/03/2016 1018   CO2 24 08/30/2016 0954   BUN 25 (H) 01/26/2017 1130   BUN 25 (H) 11/03/2016 1018   BUN 20.5 08/30/2016 0954   CREATININE 1.39 (H) 01/26/2017 1130   CREATININE 1.6 (H) 11/03/2016 1018   CREATININE 1.4 (H) 08/30/2016 0954      Component Value Date/Time   CALCIUM 9.4 01/26/2017 1130   CALCIUM 10.1 11/03/2016 1018   CALCIUM 9.8 08/30/2016 0954   ALKPHOS 112 (H) 11/03/2016 1018   ALKPHOS 113 08/30/2016 0954   AST 33 11/03/2016 1018   AST 23 08/30/2016  0954   ALT 29 11/03/2016 1018   ALT 20 08/30/2016 0954   BILITOT 0.60 11/03/2016 1018   BILITOT 0.38 08/30/2016 0954      Impression and Plan: Ms. [name] is a 77 yo caucasian female with stage A CLL.   So far, everything is holding pretty stable for her CLL.  I think we can get her back in 6 months.  I do not think there is anything with her left lower leg.  I do not know if this is a little bit of phlebitis that she might have.  She is on Plavix.  She is taking quite a few medications.  She is diabetic so this may also be playing a role.  I do not think she needs any Dopplers.  I do not think that there is a blood clot there.  My exam of her legs are pretty unremarkable.  It is always nice to talk to her.     [name], MD 3/7/201912:32 PM

## 2017-03-09 ENCOUNTER — Telehealth: Payer: Self-pay | Admitting: *Deleted

## 2017-03-09 ENCOUNTER — Other Ambulatory Visit: Payer: Self-pay | Admitting: Endocrinology

## 2017-03-09 ENCOUNTER — Telehealth: Payer: Self-pay | Admitting: Internal Medicine

## 2017-03-09 NOTE — Telephone Encounter (Signed)
Pt called wanting to get her rx for amLODipine (NORVASC) 5 MG tablet for 90 days instead of 30 Pt is wanting to pick up her rx today if possible  Please advise pt  [facility] [address] [place] [employer] number [phone]

## 2017-03-09 NOTE — Telephone Encounter (Addendum)
Patient is aware of results  ----- Message from [name], MD sent at 03/08/2017  3:16 PM EST ----- call - iron level is ok!!  [name]

## 2017-03-12 ENCOUNTER — Other Ambulatory Visit: Payer: Self-pay

## 2017-03-12 ENCOUNTER — Other Ambulatory Visit: Payer: Self-pay | Admitting: Gastroenterology

## 2017-03-12 MED ORDER — AMLODIPINE BESYLATE 5 MG PO TABS: ORAL_TABLET | ORAL | 1 refills | Status: DC

## 2017-03-12 NOTE — Telephone Encounter (Signed)
Done

## 2017-03-14 ENCOUNTER — Telehealth: Payer: Self-pay | Admitting: Gastroenterology

## 2017-03-14 ENCOUNTER — Telehealth: Payer: Self-pay | Admitting: Cardiology

## 2017-03-14 MED ORDER — ISOSORBIDE MONONITRATE ER 30 MG PO TB24: ORAL_TABLET | ORAL | 3 refills | Status: DC

## 2017-03-14 MED ORDER — OMEPRAZOLE 40 MG PO CPDR: 40.0000 mg | DELAYED_RELEASE_CAPSULE | Freq: Every day | ORAL | 0 refills | Status: DC

## 2017-03-14 NOTE — Telephone Encounter (Signed)
New Message    *STAT* If patient is at the pharmacy, call can be transferred to refill team.   1. Which medications need to be refilled? (please list name of each medication and dose if known) isosorbide mononitrate (IMDUR) 30 MG 24 hr tablet  2. Which pharmacy/location (including street and city if local pharmacy) is medication to be sent to? [place]  3. Do they need a 30 day or 90 day supply? 90 day  Patient is requesting a new rx to be sent so that she can obtain a 90 day supply.

## 2017-03-14 NOTE — Telephone Encounter (Signed)
Patient states she needs a refill of medication of omeprazole but wants to know if Dr.Nandigam still wants her to take it. Patient states she doesn't seem to be having any gi symptoms. Patient due for recall colon later this year.

## 2017-03-14 NOTE — Telephone Encounter (Signed)
Sent in omeprazole for patient enough to last until her appointment  05/29/2017 at 3pm

## 2017-04-04 ENCOUNTER — Other Ambulatory Visit: Payer: Self-pay | Admitting: Endocrinology

## 2017-04-04 ENCOUNTER — Encounter: Payer: Self-pay | Admitting: Internal Medicine

## 2017-04-04 ENCOUNTER — Ambulatory Visit (INDEPENDENT_AMBULATORY_CARE_PROVIDER_SITE_OTHER): Payer: Medicare Other | Admitting: Internal Medicine

## 2017-04-04 VITALS — BP 126/72 | HR 71 | Temp 98.2°F | Resp 16 | Ht 66.0 in | Wt 176.1 lb

## 2017-04-04 DIAGNOSIS — M150 Primary generalized (osteo)arthritis: Secondary | ICD-10-CM | POA: Diagnosis not present

## 2017-04-04 DIAGNOSIS — I10 Essential (primary) hypertension: Secondary | ICD-10-CM | POA: Diagnosis not present

## 2017-04-04 DIAGNOSIS — C9190 Lymphoid leukemia, unspecified not having achieved remission: Secondary | ICD-10-CM

## 2017-04-04 DIAGNOSIS — C9110 Chronic lymphocytic leukemia of B-cell type not having achieved remission: Secondary | ICD-10-CM

## 2017-04-04 DIAGNOSIS — E785 Hyperlipidemia, unspecified: Secondary | ICD-10-CM

## 2017-04-04 DIAGNOSIS — M159 Polyosteoarthritis, unspecified: Secondary | ICD-10-CM

## 2017-04-04 DIAGNOSIS — Z23 Encounter for immunization: Secondary | ICD-10-CM

## 2017-04-04 DIAGNOSIS — K2100 Gastro-esophageal reflux disease with esophagitis, without bleeding: Secondary | ICD-10-CM

## 2017-04-04 LAB — LIPID PANEL
CHOLESTEROL: 114 mg/dL (ref 0–200)
HDL: 38.3 mg/dL — AB (ref >39.00)
LDL CALC: 41 mg/dL (ref 0–99)
NonHDL: 75.53
Total CHOL/HDL Ratio: 3
Triglycerides: 174 mg/dL — ABNORMAL HIGH (ref 0.0–149.0)
VLDL: 34.8 mg/dL (ref 0.0–40.0)

## 2017-04-04 MED ORDER — HYDROCODONE-ACETAMINOPHEN 5-325 MG PO TABS: 1.0000 | ORAL_TABLET | Freq: Three times a day (TID) | ORAL | 0 refills | Status: DC | PRN

## 2017-04-04 NOTE — Progress Notes (Signed)
Subjective:    Patient ID: [name], female    DOB: 1940-09-06, 77 y.o.   MRN: [id number]  DOS:  04/04/2017 Type of visit - description : rov Interval history: Feeling well, no major concerns DJD: Needs a refill on hydrocodone. She did complain about bilateral deltoid pain on and off, typically when she pushed herself up from a chair. ROS: No fever, chills, jaw claudication, weight loss.  Has occasional headache which is not uncommon for her. High cholesterol: Due for FLP Depression, anxiety, insomnia: On Tranxene, take it regularly, usually she is unable to sleep sometimes due to "legs will not keep still". HTN: Ambulatory BPs normal CAD: Note from cardiology reviewed CML: Saw Dr. Marin Olp 03/08/2017, felt to be stable.    Review of Systems Currently with no chest pain, difficulty breathing No nausea, vomiting, diarrhea. No blood in the stools or blood in the urine  Past Medical History:  Diagnosis Date  . Acute blood loss anemia 09/17/2015  . Anemia   . Anginal pain ([place]) 2017  . Arthritis    "hands and knees mostly" (09/16/2015)  . B12 deficiency anemia   . CAD (coronary artery disease)    Dr Gwenlyn Found  . CLL (chronic lymphocytic leukemia) ([place]) 02/25/2014  . Depression   . DJD (degenerative joint disease)   . 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 Gastritis   . GERD (gastroesophageal reflux disease)   . Headache(784.0)   . History of blood transfusion    "I"ve had 20 some; thru birth of children, loss of blood, last 2 were in ~ 1999 before my cancer surgery" (09/16/2015)  . History of hiatal hernia   . History of shingles   . Hx of colonic polyps   . Hyperlipidemia   . Hypertension   . 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 Pneumonia    X 1  . Postoperative hematoma involving circulatory system following cardiac catheterization 09/17/2015  . S/P cardiac cath 09/16/15 09/17/2015  . Type II diabetes mellitus ([place])   . Ulcerative colitis in  pediatric patient [facility])    as a child  . Venous insufficiency     Past Surgical History:  Procedure Laterality Date  . CARDIAC CATHETERIZATION  1997; 2008; 09/16/2015  . CARDIAC CATHETERIZATION N/A 09/16/2015   Procedure: Right/Left Heart Cath and Coronary/Graft Angiography;  Surgeon: Lorretta Harp, MD;  Location: [facility];  Service: Cardiovascular;  Laterality: N/A;  . CESAREAN SECTION  1966  . CORONARY ARTERY BYPASS GRAFT  1997   CABG X5  . EYE SURGERY     2 laser surgeries on left eye with implant  . EYE SURGERY Bilateral    cataracts  . FRACTURE SURGERY    . HERNIA REPAIR    . LAPAROSCOPIC ASSISTED VENTRAL HERNIA REPAIR  09/2008    with incarcerated colon;  Dr. Lucia Gaskins  . MOHS SURGERY  06/2016   BCC  . PATELLA FRACTURE SURGERY Left 11/1994   "crushed my knee"; put in a plate & 6 screws"  . REFRACTIVE SURGERY Left 07/30/2015  . 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 March Rummage  . SHOULDER ARTHROSCOPY W/ ROTATOR CUFF REPAIR Right 1997  . TONSILLECTOMY  1960s  . TOTAL KNEE ARTHROPLASTY WITH HARDWARE REMOVAL Left 12/1994    (infex, hardware removed )  . TUBAL LIGATION  1966    Social History   Socioeconomic History  . Marital status: Widowed    Spouse name: Not  on file  . Number of children: 2  . Years of education: Not on file  . Highest education level: Not on file  Occupational History  . Occupation: retired-- Research officer, trade union: RETIRED  Social Needs  . Financial resource strain: Not on file  . Food insecurity:    Worry: Not on file    Inability: Not on file  . Transportation needs:    Medical: Not on file    Non-medical: Not on file  Tobacco Use  . Smoking status: Never Smoker  . Smokeless tobacco: Never Used  Substance and Sexual Activity  . Alcohol use: No    Alcohol/week: 0.0 oz  . Drug use: No  . Sexual activity: Never  Lifestyle  . Physical activity:    Days per week: Not on file    Minutes per session: Not on file  .  Stress: Not on file  Relationships  . Social connections:    Talks on phone: Not on file    Gets together: Not on file    Attends religious service: Not on file    Active member of club or organization: Not on file    Attends meetings of clubs or organizations: Not on file    Relationship status: Not on file  . Intimate partner violence:    Fear of current or ex partner: Not on file    Emotionally abused: Not on file    Physically abused: Not on file    Forced sexual activity: Not on file  Other Topics Concern  . Not on file  Social History Narrative   Lives by herself, lost husband ~ 2004    1 child in [place]   I child in [place] as of 04/04/2017      Reactions   Codeine    REACTION: makes her nervous, orTylenol #3   Ibuprofen    REACTION: nervous   Meperidine Hcl    REACTION: nasuea and vomitting   Naproxen Sodium    REACTION: nervous      Medication List        Accurate as of 04/04/17 11:59 PM. Always use your most recent med list.          amLODipine 5 MG tablet Commonly known as:  NORVASC TAKE 1 TABLET(5 MG) BY MOUTH DAILY   aspirin 81 MG EC tablet Take 1 tablet (81 mg total) by mouth daily.   atenolol 100 MG tablet Commonly known as:  TENORMIN TAKE 1 TABLET(100 MG) BY MOUTH DAILY   atorvastatin 20 MG tablet Commonly known as:  LIPITOR TAKE 1 TABLET(20 MG) BY MOUTH DAILY   azelastine 0.1 % nasal spray Commonly known as:  ASTELIN Place 2 sprays into both nostrils at bedtime as needed for rhinitis. Use in each nostril as directed   cholecalciferol 1000 units tablet Commonly known as:  VITAMIN D Take 1,000 Units by mouth daily.   clopidogrel 75 MG tablet Commonly known as:  PLAVIX Take 1 tablet (75 mg total) by mouth daily.   clorazepate 7.5 MG tablet Commonly known as:  TRANXENE Take 1 tablet (7.5 mg total) by mouth as needed.   docusate sodium 100 MG capsule Commonly known as:  COLACE Take 200 mg by mouth daily as needed for mild  constipation.   fluticasone 50 MCG/ACT nasal spray Commonly known as:  FLONASE Place 2 sprays into both nostrils daily as needed for allergies or rhinitis.   HYDROcodone-acetaminophen 5-325 MG  tablet Commonly known as:  NORCO/VICODIN Take 1 tablet by mouth 3 (three) times daily as needed for moderate pain.   HYDROcodone-acetaminophen 5-325 MG tablet Commonly known as:  NORCO/VICODIN Take 1 tablet by mouth 3 (three) times daily as needed for moderate pain.   insulin NPH Human 100 UNIT/ML injection Commonly known as:  HUMULIN N,NOVOLIN N 20-38 Units 2 (two) times daily before a meal. Inject 38 units into the skin every morning and inject 20 units into the skin at bedtime.   insulin regular 100 units/mL injection Commonly known as:  NOVOLIN R,HUMULIN R Inject 16 Units into the skin 2 (two) times daily before a meal.   INSULIN SYRINGE .5CC/31GX5/16" 31G X 5/16" 0.5 ML Misc USE TO INJECT INSULIN 5 TIMES PER DAY   irbesartan 300 MG tablet Commonly known as:  AVAPRO Take 1 tablet (300 mg total) by mouth daily.   isosorbide mononitrate 30 MG 24 hr tablet Commonly known as:  IMDUR TAKE 1 TABLET(30 MG) BY MOUTH DAILY   liraglutide 18 MG/3ML Sopn Commonly known as:  VICTOZA ADMINISTER 1.2 MG UNDER THE SKIN EVERY DAY   metFORMIN 500 MG tablet Commonly known as:  GLUCOPHAGE TAKE 1 TABLET BY MOUTH EVERY MORNING AND 2 TABLETS EVERY EVENING   mometasone 0.1 % cream Commonly known as:  ELOCON Use as directed   multivitamin capsule Take 1 capsule by mouth daily.   neomycin-polymyxin-hydrocortisone OTIC solution Commonly known as:  CORTISPORIN 2-3 drops in each ear three times daily   omeprazole 40 MG capsule Commonly known as:  PRILOSEC Take 1 capsule (40 mg total) by mouth daily.   ONE TOUCH ULTRA SYSTEM KIT w/Device Kit 1 kit by Does not apply route once.   ONE TOUCH ULTRA TEST test strip Generic drug:  glucose blood TEST BLOOD SUGAR TWICE DAILY AS DIRECTED            Objective:   Physical Exam BP 126/72 (BP Location: Left Arm, Patient Position: Sitting, Cuff Size: Small)   Pulse 71   Temp 98.2 F (36.8 C) (Oral)   Resp 16   Ht 5' 6"  (1.676 m)   Wt 176 lb 2 oz (79.9 kg)   SpO2 97%   BMI 28.43 kg/m  General:   Well developed, well nourished . NAD.  Neck: No  thyromegaly  HEENT:  Normocephalic . Face symmetric, atraumatic Lungs:  CTA B Normal respiratory effort, no intercostal retractions, no accessory muscle use. Heart: RRR,  no murmur.  No pretibial edema bilaterally  Abdomen:  Not distended, soft, non-tender. No rebound or rigidity.   Skin: Exposed areas without rash. Not pale. Not jaundice Neurologic:  alert & oriented X3.  Speech normal, gait appropriate for age and unassisted Strength symmetric and appropriate for age.  Psych: Cognition and judgment appear intact.  Cooperative with normal attention span and concentration.  Behavior appropriate. No anxious or depressed appearing.     Assessment & Plan:     Assessment DM  Dr Dwyane Dee  +retinopathy  HTN Hyperlipidemia CRI creat ~1.4  (GFR ~ 38) Depression/anxiety: tranxene qhs prn (takes rarely) MSK: -DJD: hydrocodone (rx pcp) - T score -0.8 on December 2017, h/o a foot FX d/t walking years ago (likely a stress fracture), discussed treatment 04/2016:  exercise and cont Vit d Hem/Onc: Dr Marin Olp  -CLL -iron deficiency anemia: IV iron 2018 -B12 def CAD, Dr Gwenlyn Found MI 97 >> CABG, cath 12-13-2006, myoview 2012 no ischemic CP: Stress test 08/05/2015, indeterminate risk study, + lateral ischemia: Cardiac  catheterization 09/16/2015: Rx medical Venous insuff GI:  Dr Silverio Decamp ---GERD, IBS, h/o Gastritis ---H/o ulcerative colitis as a child H/o shingles  BCC Dr Allyson Sabal, bx 04-2015, MOH's 06-2016   PLAN DM: Per Endo HTN: Seems well controlled on amlodipine, atenolol, Avapro, Imdur.  Last BMP satisfactory High cholesterol: On Lipitor, check a FLP Depression, anxiety, insomnia:  Well controlled on as needed Tranxene. CAD: Stable per last visit with cardiology 10-2016, asx, on multiple meds including aspirin and Plavix.  No sxs of bleeding.  Last hemoglobin satisfactory. GERD: Has an appointment to see GI next month CLL: Careful follow-up by hematology DJD: Refill hydrocodone, has older pain on and off but no generalized or worrisome symptoms.  See ROS Preventive care reviewed RTC 6 months

## 2017-04-04 NOTE — Assessment & Plan Note (Signed)
--  Tdap--09/01/10 ;PNA 23: 2010 and 04-2017 ;prevnar--2015; Shingles--03/19/13; shingrix not available -PAP: no further screening is recommended  -MMG 01-2017 MMG wnl per [facility] - Bone density test on 12/2015, T score -0.8. Elected conservative treatment. -CCS--cscope 2011, again 11-2014: + Polyps, 3 years per letter - diet and exercise discussed  - labs: reviewed, due for a FLP

## 2017-04-04 NOTE — Patient Instructions (Addendum)
GO TO THE LAB : Get the blood work     GO TO THE FRONT DESK Schedule your next appointment for a checkup in 6 - 8 months  Schedule Medicare wellness exam with one of our  nurses

## 2017-04-04 NOTE — Progress Notes (Signed)
Pre visit review using our clinic review tool, if applicable. No additional management support is needed unless otherwise documented below in the visit note. 

## 2017-04-05 NOTE — Assessment & Plan Note (Signed)
DM: Per Endo HTN: Seems well controlled on amlodipine, atenolol, Avapro, Imdur.  Last BMP satisfactory High cholesterol: On Lipitor, check a FLP Depression, anxiety, insomnia: Well controlled on as needed Tranxene. CAD: Stable per last visit with cardiology 10-2016, asx, on multiple meds including aspirin and Plavix.  No sxs of bleeding.  Last hemoglobin satisfactory. GERD: Has an appointment to see GI next month CLL: Careful follow-up by hematology DJD: Refill hydrocodone, has older pain on and off but no generalized or worrisome symptoms.  See ROS Preventive care reviewed RTC 6 months

## 2017-04-09 ENCOUNTER — Other Ambulatory Visit: Payer: Self-pay | Admitting: Endocrinology

## 2017-04-17 DIAGNOSIS — E119 Type 2 diabetes mellitus without complications: Secondary | ICD-10-CM | POA: Diagnosis not present

## 2017-04-17 LAB — HM DIABETES EYE EXAM

## 2017-04-19 ENCOUNTER — Encounter: Payer: Self-pay | Admitting: Cardiology

## 2017-04-19 ENCOUNTER — Ambulatory Visit (INDEPENDENT_AMBULATORY_CARE_PROVIDER_SITE_OTHER): Payer: Medicare Other | Admitting: Cardiology

## 2017-04-19 DIAGNOSIS — N183 Chronic kidney disease, stage 3 unspecified: Secondary | ICD-10-CM

## 2017-04-19 DIAGNOSIS — Z951 Presence of aortocoronary bypass graft: Secondary | ICD-10-CM

## 2017-04-19 DIAGNOSIS — C9190 Lymphoid leukemia, unspecified not having achieved remission: Secondary | ICD-10-CM

## 2017-04-19 DIAGNOSIS — I10 Essential (primary) hypertension: Secondary | ICD-10-CM | POA: Diagnosis not present

## 2017-04-19 DIAGNOSIS — R079 Chest pain, unspecified: Secondary | ICD-10-CM

## 2017-04-19 DIAGNOSIS — C9110 Chronic lymphocytic leukemia of B-cell type not having achieved remission: Secondary | ICD-10-CM

## 2017-04-19 NOTE — Assessment & Plan Note (Signed)
CABG 1997 -patent LIMA-LAD, SVG-RI, SVG-PDA Sept 2017

## 2017-04-19 NOTE — Assessment & Plan Note (Signed)
Controlled.  

## 2017-04-19 NOTE — Assessment & Plan Note (Signed)
CRI-3- SCr 1.5

## 2017-04-19 NOTE — Assessment & Plan Note (Signed)
WBC 34k- Dr Marin Olp follows

## 2017-04-19 NOTE — Progress Notes (Signed)
04/19/2017 [name]   Oct 15, 1940  [id number]  Primary Physician Colon Branch, MD Primary Cardiologist: Dr Gwenlyn Found  HPI:  Pleasant 77 y/o female followed by Dr Gwenlyn Found with a history of CAD, s/p CABG in 1997. Cath in Sept 2017 showed patent SVG to PDA, patent LIMA-LAD, patent SVG-RI.  Her CFX was small and had an 80% stenosis. Her EF was 55%. Plan was for medical; Rx. She is in the office today for routine follow up. Since she saw Dr Gwenlyn Found last she has done well. She did say she occasional has a "sharp" shooting pain in her left chest. This was at rest, not with exertion. Overall she feels like she doesn't have the energy to do the things she had in the past like yard work. She also notes some DOE but denies any chest pain or dyspnea doing house work like vacuuming or sweeping.    Current Outpatient Medications  Medication Sig Dispense Refill  . amLODipine (NORVASC) 5 MG tablet TAKE 1 TABLET(5 MG) BY MOUTH DAILY 90 tablet 1  . aspirin EC 81 MG EC tablet Take 1 tablet (81 mg total) by mouth daily.    Marland Kitchen atenolol (TENORMIN) 100 MG tablet TAKE 1 TABLET(100 MG) BY MOUTH DAILY 90 tablet 3  . atorvastatin (LIPITOR) 20 MG tablet TAKE 1 TABLET(20 MG) BY MOUTH DAILY 30 tablet 11  . azelastine (ASTELIN) 0.1 % nasal spray Place 2 sprays into both nostrils at bedtime as needed for rhinitis. Use in each nostril as directed 30 mL 1  . Blood Glucose Monitoring Suppl (ONE TOUCH ULTRA SYSTEM KIT) W/DEVICE KIT 1 kit by Does not apply route once. 1 each 0  . cholecalciferol (VITAMIN D) 1000 UNITS tablet Take 1,000 Units by mouth daily.      . clopidogrel (PLAVIX) 75 MG tablet Take 1 tablet (75 mg total) by mouth daily. 90 tablet 1  . docusate sodium (COLACE) 100 MG capsule Take 200 mg by mouth daily as needed for mild constipation.     . fluticasone (FLONASE) 50 MCG/ACT nasal spray Place 2 sprays into both nostrils daily as needed for allergies or rhinitis. 16 g 5  . HYDROcodone-acetaminophen  (NORCO/VICODIN) 5-325 MG tablet Take 1 tablet by mouth 3 (three) times daily as needed for moderate pain. 90 tablet 0  . insulin NPH Human (HUMULIN N,NOVOLIN N) 100 UNIT/ML injection 20-38 Units 2 (two) times daily before a meal. Inject 38 units into the skin every morning and inject 20 units into the skin at bedtime.    . insulin regular (NOVOLIN R,HUMULIN R) 100 units/mL injection Inject 16 Units into the skin 2 (two) times daily before a meal.     . Insulin Syringe-Needle U-100 (INSULIN SYRINGE .5CC/31GX5/16") 31G X 5/16" 0.5 ML MISC USE TO INJECT INSULIN 5 TIMES PER DAY 200 each 0  . irbesartan (AVAPRO) 300 MG tablet Take 1 tablet (300 mg total) by mouth daily. 90 tablet 3  . isosorbide mononitrate (IMDUR) 30 MG 24 hr tablet TAKE 1 TABLET(30 MG) BY MOUTH DAILY 90 tablet 3  . liraglutide (VICTOZA) 18 MG/3ML SOPN ADMINISTER 1.2 MG UNDER THE SKIN EVERY DAY 6 mL 0  . metFORMIN (GLUCOPHAGE) 500 MG tablet TAKE 1 TABLET BY MOUTH EVERY MORNING AND 2 TABLETS EVERY EVENING 270 tablet 3  . mometasone (ELOCON) 0.1 % cream Use as directed    . Multiple Vitamin (MULTIVITAMIN) capsule Take 1 capsule by mouth daily.      Marland Kitchen neomycin-polymyxin-hydrocortisone (CORTISPORIN) otic solution  2-3 drops in each ear three times daily 10 mL 2  . omeprazole (PRILOSEC) 40 MG capsule Take 1 capsule (40 mg total) by mouth daily. 90 capsule 0  . ONE TOUCH ULTRA TEST test strip TEST BLOOD SUGAR TWICE DAILY AS DIRECTED 300 each 2  . clorazepate (TRANXENE) 7.5 MG tablet Take 1 tablet (7.5 mg total) by mouth as needed. (Patient not taking: Reported on 04/19/2017) 30 tablet 5   No current facility-administered medications for this visit.     Allergies  Allergen Reactions  . Codeine     REACTION: makes her nervous, orTylenol #3  . Ibuprofen     REACTION: nervous  . Meperidine Hcl     REACTION: nasuea and vomitting  . Naproxen Sodium     REACTION: nervous    Past Medical History:  Diagnosis Date  . Acute blood loss  anemia 09/17/2015  . Anemia   . Anginal pain ([place]) 2017  . Arthritis    "hands and knees mostly" (09/16/2015)  . B12 deficiency anemia   . CAD (coronary artery disease)    Dr Gwenlyn Found  . CLL (chronic lymphocytic leukemia) ([place]) 02/25/2014  . Depression   . DJD (degenerative joint disease)   . 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 Gastritis   . GERD (gastroesophageal reflux disease)   . Headache(784.0)   . History of blood transfusion    "I"ve had 20 some; thru birth of children, loss of blood, last 2 were in ~ 1999 before my cancer surgery" (09/16/2015)  . History of hiatal hernia   . History of shingles   . Hx of colonic polyps   . Hyperlipidemia   . Hypertension   . 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 Pneumonia    X 1  . Postoperative hematoma involving circulatory system following cardiac catheterization 09/17/2015  . S/P cardiac cath 09/16/15 09/17/2015  . Type II diabetes mellitus ([place])   . 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[facility])    as a child  . Venous insufficiency     Social History   Socioeconomic History  . Marital status: Widowed    Spouse name: Not on file  . Number of children: 2  . Years of education: Not on file  . Highest education level: Not on file  Occupational History  . Occupation: retired-- Research officer, trade union: RETIRED  Social Needs  . Financial resource strain: Not on file  . Food insecurity:    Worry: Not on file    Inability: Not on file  . Transportation needs:    Medical: Not on file    Non-medical: Not on file  Tobacco Use  . Smoking status: Never Smoker  . Smokeless tobacco: Never Used  Substance and Sexual Activity  . Alcohol use: No    Alcohol/week: 0.0 oz  . Drug use: No  . Sexual activity: Never  Lifestyle  . Physical activity:    Days per week: Not on file    Minutes per session: Not on file  . Stress: Not on file  Relationships  . Social connections:    Talks on phone: Not on file     Gets together: Not on file    Attends religious service: Not on file    Active member of club or organization: Not on file    Attends meetings of clubs or organizations: Not on file    Relationship status: Not on file  . Intimate partner violence:  Fear of current or ex partner: Not on file    Emotionally abused: Not on file    Physically abused: Not on file    Forced sexual activity: Not on file  Other Topics Concern  . Not on file  Social History Narrative   Lives by herself, lost husband ~ 2004    1 child in [place]   I child in [place]     Family History  Problem Relation Age of Onset  . Heart disease Mother 33  . Heart disease Father 42       MI  . Hypertension Child   . Heart attack Child   . Heart disease Maternal Aunt        x 2, all deceased  . Heart disease Maternal Uncle        x 4, all deceased  . Emphysema Brother 76  . Colon cancer Neg Hx   . Breast cancer Neg Hx      Review of Systems: General: negative for chills, fever, night sweats or weight changes.  Cardiovascular: negative for chest pain, dyspnea on exertion, edema, orthopnea, palpitations, paroxysmal nocturnal dyspnea or shortness of breath Dermatological: negative for rash Respiratory: negative for cough or wheezing Urologic: negative for hematuria Abdominal: negative for nausea, vomiting, diarrhea, bright red blood per rectum, melena, or hematemesis Neurologic: negative for visual changes, syncope, or dizziness All other systems reviewed and are otherwise negative except as noted above.    Blood pressure 130/78, pulse 70, height 5' 6"  (1.676 m), weight 175 lb (79.4 kg).  General appearance: alert, cooperative and no distress Neck: no carotid bruit and no JVD Lungs: clear to auscultation bilaterally Heart: regular rate and rhythm Abdomen: mid line surgical scar, non tender Extremities: trace edema Skin: Skin color, texture, turgor normal. 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 Her symptoms are not clearly anginal. I offered to add low dose Imdur to see if that didn't help her vague chest pain and DOE symptoms. At this time she declines any change in her medical Rx. She will contact us if she has any change in her symptoms. F/U DFr Berry 6 months.   [name] PA-C 04/19/2017 10:40 AM

## 2017-04-19 NOTE — Assessment & Plan Note (Signed)
Vague chest pain, not clearly anginal

## 2017-04-19 NOTE — Patient Instructions (Signed)
Medication Instructions:  Your physician recommends that you continue on your current medications as directed. Please refer to the Current Medication list given to you today.  Labwork: None   Testing/Procedures: None   Follow-Up: Your physician wants you to follow-up in: 6 months with Dr Berry. You will receive a reminder letter in the mail two months in advance. If you don't receive a letter, please call our office to schedule the follow-up appointment.  Any Other Special Instructions Will Be Listed Below (If Applicable). If you need a refill on your cardiac medications before your next appointment, please call your pharmacy.  

## 2017-05-08 ENCOUNTER — Other Ambulatory Visit: Payer: Self-pay | Admitting: Internal Medicine

## 2017-05-24 ENCOUNTER — Other Ambulatory Visit (INDEPENDENT_AMBULATORY_CARE_PROVIDER_SITE_OTHER): Payer: Medicare Other

## 2017-05-24 DIAGNOSIS — R809 Proteinuria, unspecified: Secondary | ICD-10-CM

## 2017-05-24 DIAGNOSIS — Z794 Long term (current) use of insulin: Secondary | ICD-10-CM

## 2017-05-24 DIAGNOSIS — E1129 Type 2 diabetes mellitus with other diabetic kidney complication: Secondary | ICD-10-CM

## 2017-05-24 DIAGNOSIS — E1165 Type 2 diabetes mellitus with hyperglycemia: Secondary | ICD-10-CM

## 2017-05-24 LAB — COMPREHENSIVE METABOLIC PANEL
ALK PHOS: 76 U/L (ref 39–117)
ALT: 21 U/L (ref 0–35)
AST: 22 U/L (ref 0–37)
Albumin: 3.9 g/dL (ref 3.5–5.2)
BILIRUBIN TOTAL: 0.4 mg/dL (ref 0.2–1.2)
BUN: 22 mg/dL (ref 6–23)
CALCIUM: 9.3 mg/dL (ref 8.4–10.5)
CO2: 27 meq/L (ref 19–32)
Chloride: 110 mEq/L (ref 96–112)
Creatinine, Ser: 1.19 mg/dL (ref 0.40–1.20)
GFR: 46.66 mL/min — AB (ref >60.00)
GLUCOSE: 169 mg/dL — AB (ref 70–99)
POTASSIUM: 4.3 meq/L (ref 3.5–5.1)
Sodium: 144 mEq/L (ref 135–145)
TOTAL PROTEIN: 6.5 g/dL (ref 6.0–8.3)

## 2017-05-24 LAB — MICROALBUMIN / CREATININE URINE RATIO
CREATININE, U: 119.2 mg/dL
MICROALB UR: 57.4 mg/dL — AB (ref 0.0–1.9)
MICROALB/CREAT RATIO: 48.1 mg/g — AB (ref 0.0–30.0)

## 2017-05-24 LAB — HEMOGLOBIN A1C: Hgb A1c MFr Bld: 6.4 % (ref 4.6–6.5)

## 2017-05-29 ENCOUNTER — Ambulatory Visit (INDEPENDENT_AMBULATORY_CARE_PROVIDER_SITE_OTHER): Payer: Medicare Other | Admitting: Gastroenterology

## 2017-05-29 ENCOUNTER — Encounter: Payer: Self-pay | Admitting: Gastroenterology

## 2017-05-29 ENCOUNTER — Ambulatory Visit (INDEPENDENT_AMBULATORY_CARE_PROVIDER_SITE_OTHER): Payer: Medicare Other | Admitting: Endocrinology

## 2017-05-29 ENCOUNTER — Encounter: Payer: Self-pay | Admitting: Endocrinology

## 2017-05-29 VITALS — BP 142/82 | HR 61 | Ht 66.0 in | Wt 178.6 lb

## 2017-05-29 VITALS — BP 156/88 | HR 64 | Ht 66.0 in | Wt 178.4 lb

## 2017-05-29 DIAGNOSIS — D126 Benign neoplasm of colon, unspecified: Secondary | ICD-10-CM | POA: Diagnosis not present

## 2017-05-29 DIAGNOSIS — E1165 Type 2 diabetes mellitus with hyperglycemia: Secondary | ICD-10-CM

## 2017-05-29 DIAGNOSIS — N289 Disorder of kidney and ureter, unspecified: Secondary | ICD-10-CM | POA: Diagnosis not present

## 2017-05-29 DIAGNOSIS — I10 Essential (primary) hypertension: Secondary | ICD-10-CM

## 2017-05-29 DIAGNOSIS — Z794 Long term (current) use of insulin: Secondary | ICD-10-CM

## 2017-05-29 DIAGNOSIS — K2100 Gastro-esophageal reflux disease with esophagitis, without bleeding: Secondary | ICD-10-CM

## 2017-05-29 MED ORDER — RANITIDINE HCL 150 MG PO TABS: 150.0000 mg | ORAL_TABLET | Freq: Every day | ORAL | 3 refills | Status: DC

## 2017-05-29 NOTE — Patient Instructions (Addendum)
Check blood sugars on waking up  3-4/7  Also check blood sugars about 2 hours after a meal and do this after different meals by rotation  Recommended blood sugar levels on waking up is 90-130 and about 2 hours after meal is 130-160  Please bring your blood sugar monitor to each visit, thank you  May try 0.6 Victoza 2x daily Call when out

## 2017-05-29 NOTE — Patient Instructions (Addendum)
Decrease omeprazole to 20 mg daily before breakfast  We will send Zantac to your pharmacy  Follow up in 4 months   If you are age 78 or older, your body mass index should be between 23-30. Your Body mass index is 28.79 kg/m. If this is out of the aforementioned range listed, please consider follow up with your Primary Care Provider.  If you are age 70 or younger, your body mass index should be between 19-25. Your Body mass index is 28.79 kg/m. If this is out of the aformentioned range listed, please consider follow up with your Primary Care Provider.

## 2017-05-29 NOTE — Progress Notes (Signed)
Patient ID: [name], female   DOB: 11-07-1940, 77 y.o.   MRN: [id number]            Reason for Appointment: Followup for Type 2 Diabetes   History of Present Illness:          Diagnosis: Type 2 diabetes mellitus, date of diagnosis: 1998       Past history:   She was treated with metformin at diagnosis when this had been continued until 2015 Subsequently Amaryl was also added several years ago and this has been continued Her blood sugars were under fair control between 2010 and early 2013 with A1c ranging from 7.4-9.4, mostly under 8% However since 08/2011 her A1c has been mostly over 8%  Insulin was added in 2014 with small doses of Lantus and this has been progressively increased She was started on mealtime insulin on her initial consultation in 9/15 because of high postprandial readings, sometimes over 300 With adding Victoza in 02/2014 her blood sugars were somewhat better with A1c coming down below 8%  Recent history:   INSULIN regimen: Regular 16 units before meals.  NPH 38 units in the morning and 20 hs  Non-insulin hypoglycemic drugs the patient is taking are:  Metformin 500 a.m., 1000 mg p.m. and Victoza 1.2 mg daily  A1c has been consistently under 7, now 6.4  Current blood sugar patterns, daily management and problems identified:  Fasting blood sugars: Again her blood sugars are inconsistent and higher at times for no apparent reason Difficulty no her sugars are higher because of her diet the night before but also because of variability in her NPH insulin However no hypoglycemia after waking up or during the night  Daytime readings: She is not doing any readings during the day and mostly around suppertime or later with only one reading of 89 at 7 PM  Postprandial readings:  Renal blood sugars are fluctuating considerably after supper and as high as 259 However this was related to eating a higher fat meat at suppertime at a restaurant but her blood sugars  have been as low as 85 after supper She says that she is tending to eat out more often  Insulin regimen:  She is taking 38 units of NPH in the morning and 20 at bedtime  She does not adjust her regular insulin based on what she is eating  Also not clear if her morning insulin is adequate since she does not check readings after eating  Again she is taking the same dose of insulin at breakfast and suppertime despite different intake  VICTOZA: Recently she thinks that it is causing itching when she takes it at night as she will wake up with itching in her extremities which may be severe.  On reducing the dose or skipping the dose a couple of times at least her itching is much less      HYPOGLYCEMIA: None including overnight   Glucose monitoring:  done 1-2 times a day         Glucometer: One Touch.       Blood Glucose readings by time of day from download:  Mean values apply above for all meters except median for One Touch  PRE-MEAL Fasting Lunch Dinner Bedtime Overall  Glucose range:  103-201      Mean/median:  154    152   POST-MEAL PC Breakfast PC Lunch PC Dinner  Glucose range:    85-259  Mean/median:    150   Previous readings:  Mean values apply above for all meters except median for One Touch  PRE-MEAL 9-11 AM Lunch Dinner Bedtime Overall  Glucose range: 105-209 83-184     Mean/median: 160 154   54+/-26   POST-MEAL PC Breakfast PC Lunch PC Dinner  Glucose range:   112-185  Mean/median:   139     Self-care: The diet that the patient has been following is: none, usually eating low fat meals Meals: 2-3 meals per day at irregular but overall still about.  Dinner 6-7 pm  Breakfast is  sometimes crackers  cheese toast or oatmeal, yogurt, occ. eating sandwiches for meals, snacks will be with crackers          Dietician visit, most recent: never.  She saw the CDE in 02/2014              Exercise: none except some yard work  Weight history: 150-170 previously  Wt  Readings from Last 3 Encounters:  05/29/17 178 lb 6.4 oz (80.9 kg)  05/29/17 178 lb 9.6 oz (81 kg)  04/19/17 175 lb (79.4 kg)    Glycemic control:   Lab Results  Component Value Date   HGBA1C 6.4 05/24/2017   HGBA1C 6.5 01/26/2017   HGBA1C 5.7 09/19/2016   Lab Results  Component Value Date   MICROALBUR 57.4 (H) 05/24/2017   LDLCALC 41 04/04/2017   CREATININE 1.19 05/24/2017     OTHER active problems  discussed in review of systems   Allergies as of 05/29/2017      Reactions   Codeine    REACTION: makes her nervous, orTylenol #3   Ibuprofen    REACTION: nervous   Meperidine Hcl    REACTION: nasuea and vomitting   Naproxen Sodium    REACTION: nervous      Medication List        Accurate as of 05/29/17  4:03 PM. Always use your most recent med list.          amLODipine 5 MG tablet Commonly known as:  NORVASC TAKE 1 TABLET(5 MG) BY MOUTH DAILY   aspirin 81 MG EC tablet Take 1 tablet (81 mg total) by mouth daily.   atenolol 100 MG tablet Commonly known as:  TENORMIN Take 1 tablet (100 mg total) by mouth daily.   atorvastatin 20 MG tablet Commonly known as:  LIPITOR TAKE 1 TABLET(20 MG) BY MOUTH DAILY   azelastine 0.1 % nasal spray Commonly known as:  ASTELIN Place 2 sprays into both nostrils at bedtime as needed for rhinitis. Use in each nostril as directed   cholecalciferol 1000 units tablet Commonly known as:  VITAMIN D Take 1,000 Units by mouth daily.   clopidogrel 75 MG tablet Commonly known as:  PLAVIX Take 1 tablet (75 mg total) by mouth daily.   clorazepate 7.5 MG tablet Commonly known as:  TRANXENE Take 1 tablet (7.5 mg total) by mouth as needed.   docusate sodium 100 MG capsule Commonly known as:  COLACE Take 200 mg by mouth daily as needed for mild constipation.   fluticasone 50 MCG/ACT nasal spray Commonly known as:  FLONASE Place 2 sprays into both nostrils daily as needed for allergies or rhinitis.   HYDROcodone-acetaminophen  5-325 MG tablet Commonly known as:  NORCO/VICODIN Take 1 tablet by mouth 3 (three) times daily as needed for moderate pain.   insulin NPH Human 100 UNIT/ML injection Commonly known as:  HUMULIN N,NOVOLIN N 20-38 Units 2 (two) times daily before a meal. Inject 38   units into the skin every morning and inject 20 units into the skin at bedtime.   insulin regular 100 units/mL injection Commonly known as:  NOVOLIN R,HUMULIN R Inject 16 Units into the skin 2 (two) times daily before a meal.   INSULIN SYRINGE .5CC/31GX5/16" 31G X 5/16" 0.5 ML Misc USE TO INJECT INSULIN 5 TIMES PER DAY   irbesartan 300 MG tablet Commonly known as:  AVAPRO Take 1 tablet (300 mg total) by mouth daily.   isosorbide mononitrate 30 MG 24 hr tablet Commonly known as:  IMDUR TAKE 1 TABLET(30 MG) BY MOUTH DAILY   liraglutide 18 MG/3ML Sopn Commonly known as:  VICTOZA ADMINISTER 1.2 MG UNDER THE SKIN EVERY DAY   metFORMIN 500 MG tablet Commonly known as:  GLUCOPHAGE TAKE 1 TABLET BY MOUTH EVERY MORNING AND 2 TABLETS EVERY EVENING   mometasone 0.1 % cream Commonly known as:  ELOCON Use as directed   multivitamin capsule Take 1 capsule by mouth daily.   neomycin-polymyxin-hydrocortisone OTIC solution Commonly known as:  CORTISPORIN 2-3 drops in each ear three times daily   omeprazole 40 MG capsule Commonly known as:  PRILOSEC Take 1 capsule (40 mg total) by mouth daily.   ONE TOUCH ULTRA SYSTEM KIT w/Device Kit 1 kit by Does not apply route once.   ONE TOUCH ULTRA TEST test strip Generic drug:  glucose blood TEST BLOOD SUGAR TWICE DAILY AS DIRECTED   ranitidine 150 MG tablet Commonly known as:  ZANTAC Take 1 tablet (150 mg total) by mouth at bedtime.       Allergies:  Allergies  Allergen Reactions  . Codeine     REACTION: makes her nervous, orTylenol #3  . Ibuprofen     REACTION: nervous  . Meperidine Hcl     REACTION: nasuea and vomitting  . Naproxen Sodium     REACTION: nervous     Past Medical History:  Diagnosis Date  . Acute blood loss anemia 09/17/2015  . Anemia   . Anginal pain (HCC) 2017  . Arthritis    "hands and knees mostly" (09/16/2015)  . B12 deficiency anemia   . CAD (coronary artery disease)    Dr Berry  . 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 Depression   . DJD (degenerative joint disease)   . 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 Gastritis   . GERD (gastroesophageal reflux disease)   . Headache(784.0)   . History of blood transfusion    "I"ve had 20 some; thru birth of children, loss of blood, last 2 were in ~ 1999 before my cancer surgery" (09/16/2015)  . History of hiatal hernia   . History of shingles   . Hx of colonic polyps   . Hyperlipidemia   . Hypertension   . 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HCC) 2000  . Pneumonia    X 1  . Postoperative hematoma involving circulatory system following cardiac catheterization 09/17/2015  . S/P cardiac cath 09/16/15 09/17/2015  . Type II diabetes mellitus (HCC)   . 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 Venous insufficiency     Past Surgical History:  Procedure Laterality Date  . CARDIAC CATHETERIZATION  1997; 2008; 09/16/2015  . CARDIAC CATHETERIZATION N/A 09/16/2015   Procedure: Right/Left Heart Cath and Coronary/Graft Angiography;  Surgeon: Jonathan J Berry, MD;  Location: [facility];  Service: Cardiovascular;  Laterality: N/A;  . CESAREAN SECTION  1966  . CORONARY ARTERY BYPASS   GRAFT  1997   CABG X5  . EYE SURGERY     2 laser surgeries on left eye with implant  . EYE SURGERY Bilateral    cataracts  . FRACTURE SURGERY    . HERNIA REPAIR    . LAPAROSCOPIC ASSISTED VENTRAL HERNIA REPAIR  09/2008    with incarcerated colon;  Dr. Newman  . MOHS SURGERY  06/2016   [facility]  . PATELLA FRACTURE SURGERY Left 11/1994   "crushed my knee"; put in a plate & 6 screws"  . REFRACTIVE SURGERY Left 07/30/2015  . 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 Price  . SHOULDER ARTHROSCOPY W/ ROTATOR CUFF REPAIR Right 1997  . TONSILLECTOMY  1960s  . TOTAL KNEE ARTHROPLASTY WITH HARDWARE REMOVAL Left 12/1994    (infex, hardware removed )  . TUBAL LIGATION  1966    Family History  Problem Relation Age of Onset  . Heart disease Mother 73  . Heart disease Father 53       MI  . Hypertension Child   . Heart attack Child   . Heart disease Maternal Aunt        x 2, all deceased  . Heart disease Maternal Uncle        x 4, all deceased  . Emphysema Brother 59  . Colon cancer Neg Hx   . Breast cancer Neg Hx     Social History:  reports that she has never smoked. She has never used smokeless tobacco. She reports that she does not drink alcohol or use drugs.    Review of Systems          Lipids: Has had excellent control of hypercholesterolemia with long-term use of Lipitor She has a history of coronary bypass surgery.   Also has been  on Niaspan, Prescribed by PCP       Lab Results  Component Value Date   CHOL 114 04/04/2017   HDL 38.30 (L) 04/04/2017   LDLCALC 41 04/04/2017   LDLDIRECT 36.0 05/26/2016   TRIG 174.0 (H) 04/04/2017   CHOLHDL 3 04/04/2017                  The blood pressure has been Treated with atenolol 100 mg and currently on half of 300 mg Avapro  Also on amlodipine  She may have some swelling at times in her legs  Also does have borderline high microalbumin level  BP Readings from Last 3 Encounters:  05/29/17 (!) 156/88  05/29/17 (!) 142/82  04/19/17 130/78     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 creatinine Is upper normal usually, better now No history of hyperkalemia    Lab Results  Component Value Date   CREATININE 1.19 05/24/2017   CREATININE 1.42 (H) 03/08/2017   CREATININE 1.39 (H) 01/26/2017       No history of Numbness, tingling or burning in feet   Diabetic foot exam Done in 5/18 showing mild neuropathy    Physical Examination:  BP (!) 142/82 (BP Location: Left Arm, Patient  Position: Sitting, Cuff Size: Normal)   Pulse 61   Ht 5' 6" (1.676 m)   Wt 178 lb 9.6 oz (81 kg)   SpO2 97%   BMI 28.83 kg/m   Diabetic Foot Exam - Simple   Simple Foot Form Diabetic Foot exam was performed with the following findings:  Yes   Visual Inspection No deformities, no ulcerations, no other skin breakdown bilaterally:  Yes See comments:  Yes Sensation Testing   Intact to touch and monofilament testing bilaterally:  Yes Pulse Check Posterior Tibialis and Dorsalis pulse intact bilaterally:  Yes See comments:  Yes Comments Relatively decreased pulses on the left foot Decreased monofilament sensation on the left plantar surface only    She has 1-2+ edema on lower legs and around ankles bilaterally   Diabetes type 2, insulin requiring with BMI 29   See history of present illness for detailed discussion of her current blood sugar patterns, management and problems identified  Her A1c is fairly good but her blood sugars are averaging about 150 even though her A1c indicates lower readings  Not clear why her blood sugars are fluctuating although some of the readings are higher from eating out in the evening However her fasting readings are variably high also She does not want to use brand-name insulin products because of cost Also still concerned about the cost of Victoza  Recently appears to be having itching from Victoza but she wants to use up what she has because of the high cost  No hypoglycemia with lowest blood sugar 85 recently   RENAL insufficiency: Her creatinine is better  HYPERTENSION: Blood pressure is controlled now She may tend to have some edema from amlodipine but it is helping her pressure   PLAN:   Discussed need to adjust her insulin at mealtimes based on what she is eating  Also needs to do more readings after her breakfast or lunch and not just fasting and bedtime, also can cut back on frequency of testing in the morning  She will need to add  2 to 4 units more than she is eating out especially if she is having relatively higher fat intake.  She can take her insulin at the time of her meal if needed  If her morning sugars are consistently high we will increase her NPH otherwise not  Rotate injection sites consistently  Showed her how the Trulicity pen would work and this would replace Victoza with a weekly dose.  Discussed how this is different and since she may potentially have nausea will start her on 0.75 mg weekly first and then 1.5 on the second month.  Also given her a coupon for 1 months free supply  She can switch to Trulicity after finishing the dose and she will call for prescription  She will continue to follow-up for other problems with PCP  Encouraged her to be more active  Elevate feet when sitting to reduce edema   Counseling time on subjects discussed in assessment and plan sections is over 50% of today's 25 minute visit    05/29/2017, 4:03 PM   Note: This office note was prepared with Dragon voice recognition system technology. Any transcriptional errors that result from this process are unintentional.  

## 2017-05-29 NOTE — Progress Notes (Signed)
[name]    [id number]    1940/02/20  Primary Care Physician:Paz, Alda Berthold, MD  Referring Physician: Colon Branch, MD [address] [place], [place] [address]  Chief complaint:  GERD  HPI:  77 year old female with history of chronic GERD is here for follow-up visit.  Patient was last seen in office November 2016. 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  Colonoscopy November 2016: 5 small polyps removed, tubular adenomas EGD November 2016 LA grade B erosive esophagitis and gastritis  GERD symptoms currently well controlled on PPI daily.  Denies any dysphagia, odynophagia, nausea, vomiting, abdominal pain, change in bowel habits, melena or blood per rectum.  Outpatient Encounter Medications as of 05/29/2017  Medication Sig  . amLODipine (NORVASC) 5 MG tablet TAKE 1 TABLET(5 MG) BY MOUTH DAILY  . aspirin EC 81 MG EC tablet Take 1 tablet (81 mg total) by mouth daily.  Marland Kitchen atenolol (TENORMIN) 100 MG tablet Take 1 tablet (100 mg total) by mouth daily.  Marland Kitchen atorvastatin (LIPITOR) 20 MG tablet TAKE 1 TABLET(20 MG) BY MOUTH DAILY  . azelastine (ASTELIN) 0.1 % nasal spray Place 2 sprays into both nostrils at bedtime as needed for rhinitis. Use in each nostril as directed  . Blood Glucose Monitoring Suppl (ONE TOUCH ULTRA SYSTEM KIT) W/DEVICE KIT 1 kit by Does not apply route once.  . cholecalciferol (VITAMIN D) 1000 UNITS tablet Take 1,000 Units by mouth daily.    . clopidogrel (PLAVIX) 75 MG tablet Take 1 tablet (75 mg total) by mouth daily.  . clorazepate (TRANXENE) 7.5 MG tablet Take 1 tablet (7.5 mg total) by mouth as needed.  . docusate sodium (COLACE) 100 MG capsule Take 200 mg by mouth daily as needed for mild constipation.   . fluticasone (FLONASE) 50 MCG/ACT nasal spray Place 2 sprays into both nostrils daily as needed for allergies or rhinitis.  Marland Kitchen HYDROcodone-acetaminophen (NORCO/VICODIN) 5-325 MG  tablet Take 1 tablet by mouth 3 (three) times daily as needed for moderate pain.  Marland Kitchen insulin NPH Human (HUMULIN N,NOVOLIN N) 100 UNIT/ML injection 20-38 Units 2 (two) times daily before a meal. Inject 38 units into the skin every morning and inject 20 units into the skin at bedtime.  . insulin regular (NOVOLIN R,HUMULIN R) 100 units/mL injection Inject 16 Units into the skin 2 (two) times daily before a meal.   . Insulin Syringe-Needle U-100 (INSULIN SYRINGE .5CC/31GX5/16") 31G X 5/16" 0.5 ML MISC USE TO INJECT INSULIN 5 TIMES PER DAY  . irbesartan (AVAPRO) 300 MG tablet Take 1 tablet (300 mg total) by mouth daily.  . isosorbide mononitrate (IMDUR) 30 MG 24 hr tablet TAKE 1 TABLET(30 MG) BY MOUTH DAILY  . liraglutide (VICTOZA) 18 MG/3ML SOPN ADMINISTER 1.2 MG UNDER THE SKIN EVERY DAY  . metFORMIN (GLUCOPHAGE) 500 MG tablet TAKE 1 TABLET BY MOUTH EVERY MORNING AND 2 TABLETS EVERY EVENING  . mometasone (ELOCON) 0.1 % cream Use as directed  . Multiple Vitamin (MULTIVITAMIN) capsule Take 1 capsule by mouth daily.    Marland Kitchen neomycin-polymyxin-hydrocortisone (CORTISPORIN) otic solution 2-3 drops in each ear three times daily (Patient taking differently: 2-3 drops in each ear three times daily)  . omeprazole (PRILOSEC) 40 MG capsule Take 1 capsule (40 mg total) by mouth daily.  . ONE TOUCH ULTRA TEST test strip TEST BLOOD SUGAR TWICE DAILY AS DIRECTED   No facility-administered encounter medications  on file as of 05/29/2017.     Allergies as of 05/29/2017 - Review Complete 05/29/2017  Allergen Reaction Noted  . Codeine    . Ibuprofen    . Meperidine hcl    . Naproxen sodium      Past Medical History:  Diagnosis Date  . Acute blood loss anemia 09/17/2015  . Anemia   . Anginal pain ([place]) 2017  . Arthritis    "hands and knees mostly" (09/16/2015)  . B12 deficiency anemia   . CAD (coronary artery disease)    Dr Gwenlyn Found  . 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 Depression   . DJD  (degenerative joint disease)   . 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 Gastritis   . GERD (gastroesophageal reflux disease)   . Headache(784.0)   . History of blood transfusion    "I"ve had 20 some; thru birth of children, loss of blood, last 2 were in ~ 1999 before my cancer surgery" (09/16/2015)  . History of hiatal hernia   . History of shingles   . Hx of colonic polyps   . Hyperlipidemia   . Hypertension   . 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[place]) 2000  . Pneumonia    X 1  . Postoperative hematoma involving circulatory system following cardiac catheterization 09/17/2015  . S/P cardiac cath 09/16/15 09/17/2015  . Type II diabetes mellitus ([place])   . 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 Venous insufficiency     Past Surgical History:  Procedure Laterality Date  . CARDIAC CATHETERIZATION  1997; 2008; 09/16/2015  . CARDIAC CATHETERIZATION N/A 09/16/2015   Procedure: Right/Left Heart Cath and Coronary/Graft Angiography;  Surgeon: Lorretta Harp, MD;  Location: [facility];  Service: Cardiovascular;  Laterality: N/A;  . CESAREAN SECTION  1966  . CORONARY ARTERY BYPASS GRAFT  1997   CABG X5  . EYE SURGERY     2 laser surgeries on left eye with implant  . EYE SURGERY Bilateral    cataracts  . FRACTURE SURGERY    . HERNIA REPAIR    . LAPAROSCOPIC ASSISTED VENTRAL HERNIA REPAIR  09/2008    with incarcerated colon;  Dr. Lucia Gaskins  . MOHS SURGERY  06/2016   BCC  . PATELLA FRACTURE SURGERY Left 11/1994   "crushed my knee"; put in a plate & 6 screws"  . REFRACTIVE SURGERY Left 07/30/2015  . 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 March Rummage  . SHOULDER ARTHROSCOPY W/ ROTATOR CUFF REPAIR Right 1997  . TONSILLECTOMY  1960s  . TOTAL KNEE ARTHROPLASTY WITH HARDWARE REMOVAL Left 12/1994    (infex, hardware removed )  . TUBAL LIGATION  1966    Family History  Problem Relation Age of Onset  . Heart disease Mother 13  . Heart disease  Father 71       MI  . Hypertension Child   . Heart attack Child   . Heart disease Maternal Aunt        x 2, all deceased  . Heart disease Maternal Uncle        x 4, all deceased  . Emphysema Brother 46  . Colon cancer Neg Hx   . Breast cancer Neg Hx     Social History   Socioeconomic History  . Marital status: Widowed    Spouse name: Not on file  . Number of children: 2  . Years of education: Not on  file  . Highest education level: Not on file  Occupational History  . Occupation: retired-- Research officer, trade union: RETIRED  Social Needs  . Financial resource strain: Not on file  . Food insecurity:    Worry: Not on file    Inability: Not on file  . Transportation needs:    Medical: Not on file    Non-medical: Not on file  Tobacco Use  . Smoking status: Never Smoker  . Smokeless tobacco: Never Used  Substance and Sexual Activity  . Alcohol use: No    Alcohol/week: 0.0 oz  . Drug use: No  . Sexual activity: Never  Lifestyle  . Physical activity:    Days per week: Not on file    Minutes per session: Not on file  . Stress: Not on file  Relationships  . Social connections:    Talks on phone: Not on file    Gets together: Not on file    Attends religious service: Not on file    Active member of club or organization: Not on file    Attends meetings of clubs or organizations: Not on file    Relationship status: Not on file  . Intimate partner violence:    Fear of current or ex partner: Not on file    Emotionally abused: Not on file    Physically abused: Not on file    Forced sexual activity: Not on file  Other Topics Concern  . Not on file  Social History Narrative   Lives by herself, lost husband ~ 2004    1 child in [place]   I child in [place]      Review of systems: Review of Systems  Constitutional: Negative for fever and chills.  Positive for lack of energy HENT: Negative.   Eyes: Negative for blurred vision.  Respiratory: Negative for cough and  wheezing.   Cardiovascular: Negative for chest pain and palpitations.  Gastrointestinal: as per HPI Genitourinary: Negative for dysuria, urgency, frequency and hematuria.  Musculoskeletal: Positive for myalgias, back pain and joint pain.  Skin: Negative for itching and rash.  Neurological: Negative for dizziness, tremors, focal weakness, seizures and loss of consciousness.  Endo/Heme/Allergies: Negative for seasonal allergies.  Positive for easy bruising Psychiatric/Behavioral: Negative for depression, suicidal ideas and hallucinations.  All other systems reviewed and are negative.   Physical Exam: Vitals:   05/29/17 1444  BP: (!) 156/88  Pulse: 64   Body mass index is 28.79 kg/m. 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, chronic GERD with erosive esophagitis and gastritis here for follow-up visit  Discussed with patient in detail about potential side effects with long-term PPI use Will decrease omeprazole to 20 mg daily, 30 minutes before breakfast given symptoms are well controlled currently, will  consider to taper off if patient continues to do well with no or minimal symptoms Add Zantac at bedtime as needed Discussed antireflux measures and lifestyle modifications in detail  Colorectal cancer screening: Increased risk with history of multiple  adenomatous Due for recall colonoscopy November 2019  Return in 4 months   25 minutes was spent face-to-face with the patient. Greater than 50% of the time used for counseling as well as treatment plan and follow-up. She had multiple questions which were answered to her satisfaction  K. Denzil Magnuson , MD [phone]    CC: Colon Branch, MD

## 2017-05-30 ENCOUNTER — Encounter: Payer: Self-pay | Admitting: Endocrinology

## 2017-06-01 ENCOUNTER — Encounter: Payer: Self-pay | Admitting: Gastroenterology

## 2017-06-19 DIAGNOSIS — E113312 Type 2 diabetes mellitus with moderate nonproliferative diabetic retinopathy with macular edema, left eye: Secondary | ICD-10-CM | POA: Diagnosis not present

## 2017-06-19 DIAGNOSIS — E113491 Type 2 diabetes mellitus with severe nonproliferative diabetic retinopathy without macular edema, right eye: Secondary | ICD-10-CM | POA: Diagnosis not present

## 2017-06-19 DIAGNOSIS — Q141 Congenital malformation of retina: Secondary | ICD-10-CM | POA: Diagnosis not present

## 2017-06-19 DIAGNOSIS — H3582 Retinal ischemia: Secondary | ICD-10-CM | POA: Diagnosis not present

## 2017-06-25 ENCOUNTER — Other Ambulatory Visit: Payer: Self-pay

## 2017-06-25 ENCOUNTER — Other Ambulatory Visit: Payer: Self-pay | Admitting: *Deleted

## 2017-06-25 ENCOUNTER — Telehealth: Payer: Self-pay | Admitting: Endocrinology

## 2017-06-25 ENCOUNTER — Telehealth: Payer: Self-pay | Admitting: *Deleted

## 2017-06-25 ENCOUNTER — Telehealth: Payer: Self-pay | Admitting: Gastroenterology

## 2017-06-25 DIAGNOSIS — C9110 Chronic lymphocytic leukemia of B-cell type not having achieved remission: Secondary | ICD-10-CM

## 2017-06-25 MED ORDER — OMEPRAZOLE 20 MG PO CPDR: 20.0000 mg | DELAYED_RELEASE_CAPSULE | Freq: Every day | ORAL | 3 refills | Status: DC

## 2017-06-25 MED ORDER — LIRAGLUTIDE 18 MG/3ML ~~LOC~~ SOPN: PEN_INJECTOR | SUBCUTANEOUS | 0 refills | Status: DC

## 2017-06-25 NOTE — Telephone Encounter (Signed)
She will going to switch because of itching from Victoza.  Need to find out why she does not want to change

## 2017-06-25 NOTE — Telephone Encounter (Signed)
Patient states she has a rash to her BLE. It's red bumps that are itchy. She states they resemble bug bites, but are not bug bites. She believes that Dr Marin Olp has seen this rash in the past, and thought that maybe a biopsy would be a good thing if the rash continued. She would like to follow up at this time. Patient denies any new contacts. Patient also states there is some spread to her arms.   Reviewed with Dr Marin Olp. He would like to know if patient has a dermatologist. Patient states she has seen a dermatologist once, but the MD has since retired. Dr Marin Olp would like [place] to send a new referral to [facility], requesting the new physician, Dr Pearline Cables to see her.

## 2017-06-25 NOTE — Telephone Encounter (Signed)
Sent in Omeprazole 20 mg to pharmacy and discontinued the 40 mg

## 2017-06-25 NOTE — Telephone Encounter (Signed)
Patient stated she do not want to change to her medication to Trulicity she want to stay on Victoza. Please advise

## 2017-06-25 NOTE — Telephone Encounter (Signed)
Please advise 

## 2017-06-25 NOTE — Telephone Encounter (Signed)
Please let her know that Trulicity does not cause cancer and all of these medications have labeled for thyroid cancer in rats and mice only

## 2017-06-25 NOTE — Telephone Encounter (Signed)
Pt was called and notified of MD message. Pt stated that she would like Dr. Dwyane Dee to be aware that she wants to stick with Victoza.

## 2017-06-25 NOTE — Telephone Encounter (Signed)
Pt stated that she is still having some itching, but it is not bad. Also states that she read the information Dr. Dwyane Dee gave her on Trulicity and it stated that it can cause cancer and she is refusing to take it because of this. She states that she will deal with the itching because she is not going to take Trulicity.

## 2017-07-23 ENCOUNTER — Other Ambulatory Visit: Payer: Self-pay | Admitting: Endocrinology

## 2017-07-25 ENCOUNTER — Ambulatory Visit (HOSPITAL_BASED_OUTPATIENT_CLINIC_OR_DEPARTMENT_OTHER)
Admission: RE | Admit: 2017-07-25 | Discharge: 2017-07-25 | Disposition: A | Discharge status: Home / Self Care | Payer: Medicare Other | Source: Ambulatory Visit | Attending: Medical | Admitting: Medical

## 2017-07-25 ENCOUNTER — Ambulatory Visit (INDEPENDENT_AMBULATORY_CARE_PROVIDER_SITE_OTHER): Payer: Medicare Other | Admitting: Medical

## 2017-07-25 ENCOUNTER — Encounter: Payer: Self-pay | Admitting: Medical

## 2017-07-25 ENCOUNTER — Other Ambulatory Visit: Payer: Self-pay | Admitting: Internal Medicine

## 2017-07-25 ENCOUNTER — Other Ambulatory Visit: Payer: Self-pay | Admitting: Medical

## 2017-07-25 VITALS — BP 135/74 | HR 75 | Temp 98.6°F | Resp 16 | Ht 66.0 in | Wt 172.0 lb

## 2017-07-25 DIAGNOSIS — R05 Cough: Secondary | ICD-10-CM

## 2017-07-25 DIAGNOSIS — C9110 Chronic lymphocytic leukemia of B-cell type not having achieved remission: Secondary | ICD-10-CM

## 2017-07-25 DIAGNOSIS — J40 Bronchitis, not specified as acute or chronic: Secondary | ICD-10-CM | POA: Diagnosis not present

## 2017-07-25 DIAGNOSIS — C9190 Lymphoid leukemia, unspecified not having achieved remission: Secondary | ICD-10-CM | POA: Diagnosis not present

## 2017-07-25 DIAGNOSIS — R059 Cough, unspecified: Secondary | ICD-10-CM

## 2017-07-25 DIAGNOSIS — J0100 Acute maxillary sinusitis, unspecified: Secondary | ICD-10-CM | POA: Diagnosis not present

## 2017-07-25 DIAGNOSIS — R509 Fever, unspecified: Secondary | ICD-10-CM | POA: Insufficient documentation

## 2017-07-25 MED ORDER — BENZONATATE 100 MG PO CAPS: 100.0000 mg | ORAL_CAPSULE | Freq: Three times a day (TID) | ORAL | 0 refills | Status: DC | PRN

## 2017-07-25 MED ORDER — AMOXICILLIN-POT CLAVULANATE 875-125 MG PO TABS: 1.0000 | ORAL_TABLET | Freq: Two times a day (BID) | ORAL | 0 refills | Status: DC

## 2017-07-25 MED ORDER — FLUTICASONE PROPIONATE 50 MCG/ACT NA SUSP: 2.0000 | Freq: Every day | NASAL | 1 refills | Status: DC

## 2017-07-25 NOTE — Progress Notes (Signed)
Subjective:    Patient ID: [name], female    DOB: 01-24-1940, 77 y.o.   MRN: [id number]  HPI  Pt in with nasal and chest congestion. She thinks may have sinus infection. Also expresses concern for bronchitis. Rt side sinus pain feel worse.   Symptoms over past 3 days.   She is coughing up some mucus thick green mucus. Pt state has been running low grade fever at night 99-100. Some chills past 2 days.    Review of Systems  Constitutional: Positive for chills and fever. Negative for fatigue.  HENT: Positive for congestion, sinus pressure and sinus pain.   Respiratory: Positive for cough. Negative for chest tightness, shortness of breath and wheezing.   Cardiovascular: Negative for chest pain and palpitations.  Gastrointestinal: Negative for abdominal pain.  Musculoskeletal: Negative for back pain.  Neurological: Negative for dizziness and headaches.  Hematological: Negative for adenopathy. Does not bruise/bleed easily.  Psychiatric/Behavioral: Negative for behavioral problems and confusion.    Past Medical History:  Diagnosis Date  . Acute blood loss anemia 09/17/2015  . Anemia   . Anginal pain ([place]) 2017  . Arthritis    "hands and knees mostly" (09/16/2015)  . B12 deficiency anemia   . CAD (coronary artery disease)    Dr Gwenlyn Found  . CLL (chronic lymphocytic leukemia) ([place]) 02/25/2014  . Depression   . DJD (degenerative joint disease)   . 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 Gastritis   . GERD (gastroesophageal reflux disease)   . Headache(784.0)   . History of blood transfusion    "I"ve had 20 some; thru birth of children, loss of blood, last 2 were in ~ 1999 before my cancer surgery" (09/16/2015)  . History of hiatal hernia   . History of shingles   . Hx of colonic polyps   . Hyperlipidemia   . Hypertension   . 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[place]) 2000  . Pneumonia    X 1  . Postoperative hematoma involving circulatory system following  cardiac catheterization 09/17/2015  . S/P cardiac cath 09/16/15 09/17/2015  . Type II diabetes mellitus ([place])   . 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[facility])    as a child  . Venous insufficiency      Social History   Socioeconomic History  . Marital status: Widowed    Spouse name: Not on file  . Number of children: 2  . Years of education: Not on file  . Highest education level: Not on file  Occupational History  . Occupation: retired-- Research officer, trade union: RETIRED  Social Needs  . Financial resource strain: Not on file  . Food insecurity:    Worry: Not on file    Inability: Not on file  . Transportation needs:    Medical: Not on file    Non-medical: Not on file  Tobacco Use  . Smoking status: Never Smoker  . Smokeless tobacco: Never Used  Substance and Sexual Activity  . Alcohol use: No    Alcohol/week: 0.0 oz  . Drug use: No  . Sexual activity: Never  Lifestyle  . Physical activity:    Days per week: Not on file    Minutes per session: Not on file  . Stress: Not on file  Relationships  . Social connections:    Talks on phone: Not on file    Gets together: Not on file    Attends religious service: Not on file    Active  member of club or organization: Not on file    Attends meetings of clubs or organizations: Not on file    Relationship status: Not on file  . Intimate partner violence:    Fear of current or ex partner: Not on file    Emotionally abused: Not on file    Physically abused: Not on file    Forced sexual activity: Not on file  Other Topics Concern  . Not on file  Social History Narrative   Lives by herself, lost husband ~ 2004    1 child in [place]   I child in [place]    Past Surgical History:  Procedure Laterality Date  . CARDIAC CATHETERIZATION  1997; 2008; 09/16/2015  . CARDIAC CATHETERIZATION N/A 09/16/2015   Procedure: Right/Left Heart Cath and Coronary/Graft Angiography;  Surgeon: Lorretta Harp, MD;  Location: [facility];   Service: Cardiovascular;  Laterality: N/A;  . CESAREAN SECTION  1966  . CORONARY ARTERY BYPASS GRAFT  1997   CABG X5  . EYE SURGERY     2 laser surgeries on left eye with implant  . EYE SURGERY Bilateral    cataracts  . FRACTURE SURGERY    . HERNIA REPAIR    . LAPAROSCOPIC ASSISTED VENTRAL HERNIA REPAIR  09/2008    with incarcerated colon;  Dr. Lucia Gaskins  . MOHS SURGERY  06/2016   BCC  . PATELLA FRACTURE SURGERY Left 11/1994   "crushed my knee"; put in a plate & 6 screws"  . REFRACTIVE SURGERY Left 07/30/2015  . 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 March Rummage  . SHOULDER ARTHROSCOPY W/ ROTATOR CUFF REPAIR Right 1997  . TONSILLECTOMY  1960s  . TOTAL KNEE ARTHROPLASTY WITH HARDWARE REMOVAL Left 12/1994    (infex, hardware removed )  . TUBAL LIGATION  1966    Family History  Problem Relation Age of Onset  . Heart disease Mother 37  . Heart disease Father 56       MI  . Hypertension Child   . Heart attack Child   . Heart disease Maternal Aunt        x 2, all deceased  . Heart disease Maternal Uncle        x 4, all deceased  . Emphysema Brother 72  . Colon cancer Neg Hx   . Breast cancer Neg Hx     Allergies  Allergen Reactions  . Codeine     REACTION: makes her nervous, orTylenol #3  . Ibuprofen     REACTION: nervous  . Meperidine Hcl     REACTION: nasuea and vomitting  . Naproxen Sodium     REACTION: nervous    Current Outpatient Medications on File Prior to Visit  Medication Sig Dispense Refill  . amLODipine (NORVASC) 5 MG tablet TAKE 1 TABLET(5 MG) BY MOUTH DAILY 90 tablet 1  . aspirin EC 81 MG EC tablet Take 1 tablet (81 mg total) by mouth daily.    Marland Kitchen atenolol (TENORMIN) 100 MG tablet Take 1 tablet (100 mg total) by mouth daily. 90 tablet 3  . atorvastatin (LIPITOR) 20 MG tablet TAKE 1 TABLET(20 MG) BY MOUTH DAILY 30 tablet 11  . azelastine (ASTELIN) 0.1 % nasal spray Place 2 sprays into both nostrils at bedtime as needed for rhinitis. Use in each  nostril as directed 30 mL 1  . Blood Glucose Monitoring Suppl (ONE TOUCH ULTRA SYSTEM KIT) W/DEVICE KIT 1 kit by Does not apply route once. 1  each 0  . cholecalciferol (VITAMIN D) 1000 UNITS tablet Take 1,000 Units by mouth daily.      . clopidogrel (PLAVIX) 75 MG tablet Take 1 tablet (75 mg total) by mouth daily. 90 tablet 1  . clorazepate (TRANXENE) 7.5 MG tablet Take 1 tablet (7.5 mg total) by mouth as needed. 30 tablet 5  . docusate sodium (COLACE) 100 MG capsule Take 200 mg by mouth daily as needed for mild constipation.     . fluticasone (FLONASE) 50 MCG/ACT nasal spray Place 2 sprays into both nostrils daily as needed for allergies or rhinitis. 16 g 5  . HYDROcodone-acetaminophen (NORCO/VICODIN) 5-325 MG tablet Take 1 tablet by mouth 3 (three) times daily as needed for moderate pain. 90 tablet 0  . insulin NPH Human (HUMULIN N,NOVOLIN N) 100 UNIT/ML injection 20-38 Units 2 (two) times daily before a meal. Inject 38 units into the skin every morning and inject 20 units into the skin at bedtime.    . insulin regular (NOVOLIN R,HUMULIN R) 100 units/mL injection Inject 16 Units into the skin 2 (two) times daily before a meal.     . Insulin Syringe-Needle U-100 (INSULIN SYRINGE .5CC/31GX5/16") 31G X 5/16" 0.5 ML MISC USE TO INJECT INSULIN 5 TIMES PER DAY 200 each 0  . irbesartan (AVAPRO) 300 MG tablet Take 1 tablet (300 mg total) by mouth daily. 90 tablet 3  . isosorbide mononitrate (IMDUR) 30 MG 24 hr tablet TAKE 1 TABLET(30 MG) BY MOUTH DAILY 90 tablet 3  . liraglutide (VICTOZA) 18 MG/3ML SOPN ADMINISTER 1.2 MG UNDER THE SKIN EVERY DAY 6 mL 0  . metFORMIN (GLUCOPHAGE) 500 MG tablet TAKE 1 TABLET BY MOUTH EVERY MORNING AND 2 TABLETS EVERY EVENING 270 tablet 3  . mometasone (ELOCON) 0.1 % cream Use as directed    . Multiple Vitamin (MULTIVITAMIN) capsule Take 1 capsule by mouth daily.      Marland Kitchen neomycin-polymyxin-hydrocortisone (CORTISPORIN) otic solution 2-3 drops in each ear three times daily  (Patient taking differently: 2-3 drops in each ear three times daily) 10 mL 2  . omeprazole (PRILOSEC) 20 MG capsule Take 1 capsule (20 mg total) by mouth daily. 90 capsule 3  . ONE TOUCH ULTRA TEST test strip TEST BLOOD SUGAR TWICE DAILY AS DIRECTED 300 each 2  . ranitidine (ZANTAC) 150 MG tablet Take 1 tablet (150 mg total) by mouth at bedtime. 30 tablet 3   No current facility-administered medications on file prior to visit.     BP 135/74   Pulse 75   Temp 98.6 F (37 C) (Oral)   Resp 16   Ht 5' 6"  (1.676 m)   Wt 172 lb (78 kg)   SpO2 99%   BMI 27.76 kg/m       Objective:   Physical Exam   General  Mental Status - Alert. General Appearance - Well groomed. Not in acute distress.  Skin Rashes- No Rashes.  HEENT Head- Normal. Ear Auditory Canal - Left- Normal. Right - Normal.Tympanic Membrane- Left- Normal. Right- Normal. Eye Sclera/Conjunctiva- Left- Normal. Right- Normal. Nose & Sinuses Nasal Mucosa- Left-  Boggy and Congested. Right-  Boggy and  Congested.Bilateral rt side  maxillary and frontal sinus pressure. Mouth & Throat Lips: Upper Lip- Normal: no dryness, cracking, pallor, cyanosis, or vesicular eruption. Lower Lip-Normal: no dryness, cracking, pallor, cyanosis or vesicular eruption. Buccal Mucosa- Bilateral- No Aphthous ulcers. Oropharynx- No Discharge or Erythema. Tonsils: Characteristics- Bilateral- No Erythema or Congestion. Size/Enlargement- Bilateral- No enlargement. Discharge- bilateral-None.  Neck  Neck- Supple. No Masses.   Chest and Lung Exam Auscultation: Breath Sounds:-Clear even and unlabored.  Cardiovascular Auscultation:Rythm- Regular, rate and rhythm. Murmurs & Other Heart Sounds:Ausculatation of the heart reveal- No Murmurs.  Lymphatic Head & Neck General Head & Neck Lymphatics: Bilateral: Description- No Localized lymphadenopathy.      Assessment & Plan:   You appear to have bronchitis and sinusitis. Rest hydrate and tylenol  for fever. I am prescribing cough medicine benzonatate, and augmentin  antibiotic. For your nasal congestion rx flonase  With low grade fever and hx of CLL do want to get cbc to evaluate wbc. Also will get chest xray.  Follow up in 7-10 days or as needed  [employer], [employer]

## 2017-07-25 NOTE — Patient Instructions (Signed)
You appear to have bronchitis and sinusitis. Rest hydrate and tylenol for fever. I am prescribing cough medicine benzonatate, and augmentin  antibiotic. For your nasal congestion rx flonase  With low grade fever and hx of CLL do want to get cbc to evaluate wbc. Also will get chest xray.  Follow up in 7-10 days or as needed

## 2017-07-26 LAB — CBC WITH DIFFERENTIAL/PLATELET
BASOS ABS: 0.1 10*3/uL (ref 0.0–0.1)
Basophils Relative: 0.3 % (ref 0.0–3.0)
Eosinophils Absolute: 0.4 10*3/uL (ref 0.0–0.7)
Eosinophils Relative: 0.9 % (ref 0.0–5.0)
HEMATOCRIT: 37.6 % (ref 36.0–46.0)
Hemoglobin: 12.5 g/dL (ref 12.0–15.0)
Lymphocytes Relative: 72.2 % — ABNORMAL HIGH (ref 12.0–46.0)
Lymphs Abs: 29.4 10*3/uL — ABNORMAL HIGH (ref 0.7–4.0)
MCHC: 33.1 g/dL (ref 30.0–36.0)
MCV: 92.8 fl (ref 78.0–100.0)
MONOS PCT: 0.3 % — AB (ref 3.0–12.0)
Monocytes Absolute: 0.1 10*3/uL (ref 0.1–1.0)
NEUTROS ABS: 10.7 10*3/uL — AB (ref 1.4–7.7)
Neutrophils Relative %: 26.3 % — ABNORMAL LOW (ref 43.0–77.0)
PLATELETS: 224 10*3/uL (ref 150.0–400.0)
RBC: 4.05 Mil/uL (ref 3.87–5.11)
RDW: 13.5 % (ref 11.5–15.5)
WBC: 40.8 10*3/uL (ref 4.0–10.5)

## 2017-07-30 ENCOUNTER — Other Ambulatory Visit (INDEPENDENT_AMBULATORY_CARE_PROVIDER_SITE_OTHER): Payer: Medicare Other

## 2017-07-30 DIAGNOSIS — Z794 Long term (current) use of insulin: Secondary | ICD-10-CM

## 2017-07-30 DIAGNOSIS — E1165 Type 2 diabetes mellitus with hyperglycemia: Secondary | ICD-10-CM

## 2017-07-30 LAB — BASIC METABOLIC PANEL
BUN: 21 mg/dL (ref 6–23)
CO2: 24 mEq/L (ref 19–32)
CREATININE: 1.48 mg/dL — AB (ref 0.40–1.20)
Calcium: 9.7 mg/dL (ref 8.4–10.5)
Chloride: 104 mEq/L (ref 96–112)
GFR: 36.26 mL/min — AB (ref >60.00)
Glucose, Bld: 125 mg/dL — ABNORMAL HIGH (ref 70–99)
Potassium: 4.2 mEq/L (ref 3.5–5.1)
Sodium: 139 mEq/L (ref 135–145)

## 2017-07-31 LAB — FRUCTOSAMINE: FRUCTOSAMINE: 228 umol/L (ref 0–285)

## 2017-08-02 ENCOUNTER — Other Ambulatory Visit: Payer: Self-pay | Admitting: Family

## 2017-08-02 ENCOUNTER — Ambulatory Visit: Payer: Medicare Other | Admitting: Endocrinology

## 2017-08-02 ENCOUNTER — Encounter: Payer: Self-pay | Admitting: Family Medicine

## 2017-08-02 ENCOUNTER — Ambulatory Visit (INDEPENDENT_AMBULATORY_CARE_PROVIDER_SITE_OTHER): Payer: Medicare Other | Admitting: Family Medicine

## 2017-08-02 VITALS — BP 124/60 | HR 71 | Temp 98.2°F | Resp 16 | Ht 66.0 in | Wt 167.0 lb

## 2017-08-02 DIAGNOSIS — R509 Fever, unspecified: Secondary | ICD-10-CM

## 2017-08-02 DIAGNOSIS — Z794 Long term (current) use of insulin: Secondary | ICD-10-CM | POA: Diagnosis not present

## 2017-08-02 DIAGNOSIS — E118 Type 2 diabetes mellitus with unspecified complications: Secondary | ICD-10-CM

## 2017-08-02 DIAGNOSIS — C9190 Lymphoid leukemia, unspecified not having achieved remission: Secondary | ICD-10-CM

## 2017-08-02 DIAGNOSIS — R5381 Other malaise: Secondary | ICD-10-CM

## 2017-08-02 DIAGNOSIS — C9110 Chronic lymphocytic leukemia of B-cell type not having achieved remission: Secondary | ICD-10-CM

## 2017-08-02 DIAGNOSIS — IMO0001 Reserved for inherently not codable concepts without codable children: Secondary | ICD-10-CM

## 2017-08-02 LAB — CBC WITH DIFFERENTIAL/PLATELET
BASOS ABS: 0.1 10*3/uL (ref 0.0–0.1)
Basophils Relative: 0.3 % (ref 0.0–3.0)
EOS ABS: 0.3 10*3/uL (ref 0.0–0.7)
Eosinophils Relative: 0.9 % (ref 0.0–5.0)
HEMATOCRIT: 37.3 % (ref 36.0–46.0)
Hemoglobin: 12.4 g/dL (ref 12.0–15.0)
Lymphs Abs: 26.3 10*3/uL — ABNORMAL HIGH (ref 0.7–4.0)
MCHC: 33.1 g/dL (ref 30.0–36.0)
MCV: 91.6 fl (ref 78.0–100.0)
Monocytes Absolute: 1.1 10*3/uL — ABNORMAL HIGH (ref 0.1–1.0)
Monocytes Relative: 3.1 % (ref 3.0–12.0)
NEUTROS ABS: 6.4 10*3/uL (ref 1.4–7.7)
Neutrophils Relative %: 18.7 % — ABNORMAL LOW (ref 43.0–77.0)
PLATELETS: 319 10*3/uL (ref 150.0–400.0)
RBC: 4.07 Mil/uL (ref 3.87–5.11)
RDW: 13.4 % (ref 11.5–15.5)

## 2017-08-02 LAB — POCT URINALYSIS DIP (MANUAL ENTRY)
BILIRUBIN UA: NEGATIVE mg/dL
Bilirubin, UA: NEGATIVE
Blood, UA: NEGATIVE
Glucose, UA: NEGATIVE mg/dL
Nitrite, UA: NEGATIVE
Spec Grav, UA: 1.025 (ref 1.010–1.025)
UROBILINOGEN UA: 0.2 U/dL
pH, UA: 5.5 (ref 5.0–8.0)

## 2017-08-02 LAB — BASIC METABOLIC PANEL
BUN: 22 mg/dL (ref 6–23)
CHLORIDE: 105 meq/L (ref 96–112)
CO2: 26 mEq/L (ref 19–32)
Calcium: 9.6 mg/dL (ref 8.4–10.5)
Creatinine, Ser: 1.48 mg/dL — ABNORMAL HIGH (ref 0.40–1.20)
GFR: 36.26 mL/min — ABNORMAL LOW (ref >60.00)
Glucose, Bld: 112 mg/dL — ABNORMAL HIGH (ref 70–99)
POTASSIUM: 4 meq/L (ref 3.5–5.1)
Sodium: 140 mEq/L (ref 135–145)

## 2017-08-02 LAB — GLUCOSE, POCT (MANUAL RESULT ENTRY): POC Glucose: 136 mg/dl — AB (ref 70–99)

## 2017-08-02 NOTE — Patient Instructions (Addendum)
I am a bit concerned about your symptoms since you do have leukemia  We will check your blood counts for you today and I will be in touch with this report asap  For the time being continue to take your augmentin Please do monitor your blood sugar and your temperature

## 2017-08-02 NOTE — Progress Notes (Addendum)
[place] [facility] [address], [place], [place] [address] [phone]  Date:  08/02/2017   Name:  [name]   DOB:  July 01, 1940   MRN:  [id number]  PCP:  Colon Branch, MD    Chief Complaint: Cough (saw edward-xray normal, feeling better but the cough not resolved,drainage no energy, nausea, no apetite 2 weeks)   History of Present Illness:  [name] is a 77 y.o. very pleasant female patient who presents with the following:  Pt of Dr. Larose Kells who I have not seen in the past She was here on 7/24 and saw Percell Miller-  Pt in with nasal and chest congestion. She thinks may have sinus infection. Also expresses concern for bronchitis. Rt side sinus pain feel worse.  Symptoms over past 3 days.  She is coughing up some mucus thick green mucus. Pt state has been running low grade fever at night 99-100. Some chills past 2 days.//////////////////////////// You appear to have bronchitis and sinusitis. Rest hydrate and tylenol for fever. I am prescribing cough medicine benzonatate, and augmentin  antibiotic. For your nasal congestion rx flonase With low grade fever and hx of CLL do want to get cbc to evaluate wbc. Also will get chest xray. Dg Chest 2 View  Result Date: 07/25/2017 CLINICAL DATA:  Cough and fever EXAM: CHEST - 2 VIEW COMPARISON:  Chest CT 10/18/2016 FINDINGS: Normal heart size. Lobulation along the left heart border is from fat based on 2018 CT. Status post CABG. There is no edema, consolidation, effusion, or pneumothorax. Abdominal hernia repair IMPRESSION: No evidence of active disease. Electronically Signed   By: Monte Fantasia M.D.   On: 07/25/2017 16:42   Lab Results  Component Value Date   WBC 34.2 Repeated and verified X2. (HH) 08/02/2017   HGB 12.4 08/02/2017   HCT 37.3 08/02/2017   MCV 91.6 08/02/2017   PLT 319.0 08/02/2017   She reports that she is still feeling weak, and does not have much appetite She  is feeling nauseated Not vomiting however She is having chills Also diarrhea 2-3x a day since she started on the abx Cough is now minor She just feels really tired and washed out She feels like she has PND  She has not been sleeping well She did take some tylenol a couple of days ago and it seemed to help- no antipyretics more recently  She has not checked her temp recently but it did get up to 100 prior to her visit last week per her report   She does have DM and also CLL which is currently being monitored by Dr. Marin Olp and [facility] Lab Results  Component Value Date   HGBA1C 6.4 05/24/2017   She is on insulin for her DM, as well as victoza and metformin  She notes that during this illess she has not been checking her glucose as she normally does so she does not know how it is running She did have some labs drawn on monday per DR. Dwyane Dee, but did not keep her appt to see him as she "felt so bad." No rash     Patient Active Problem List   Diagnosis Date Noted  . Chest pain 04/19/2017  . IDA (iron deficiency anemia) 08/30/2016  . DOE (dyspnea on exertion) 04/19/2016  . Post cath hematoma 09/17/2015  . Positive cardiac stress test   . PCP NOTES >>>>>>>>>>>>>>>>>>> 12/02/2014  . Annual physical exam  08/30/2014  . 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 CLL (chronic lymphocytic leukemia) ([place]) 02/25/2014  . Chronic kidney disease, stage III (moderate) ([place]) 09/18/2013  . Anxiety and depression 10/03/2010  . IBS (irritable bowel syndrome) 09/01/2010  . Vitamin B deficiency 05/20/2009  . HERNIA, VENTRAL 08/27/2008  . GASTRITIS, HX OF 08/27/2008  . 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 COLONIC POLYPS 12/20/2006  . VENOUS INSUFFICIENCY 12/20/2006  . Insulin dependent diabetes mellitus with complications ([place]) 10/18/5100  . Dyslipidemia 10/16/2006  . Essential hypertension 10/16/2006  . Hx of CABG 10/16/2006  . GERD 10/16/2006  . DJD , hydrocodone,  UDS 10/16/2006  . SHINGLES, HX OF 10/16/2006    Past Medical History:  Diagnosis Date  . Acute blood loss anemia 09/17/2015  . Anemia   . Anginal pain ([place]) 2017  . Arthritis    "hands and knees mostly" (09/16/2015)  . B12 deficiency anemia   . CAD (coronary artery disease)    Dr Gwenlyn Found  . CLL (chronic lymphocytic leukemia) ([place]) 02/25/2014  . Depression   . DJD (degenerative joint disease)   . 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 Gastritis   . GERD (gastroesophageal reflux disease)   . Headache(784.0)   . History of blood transfusion    "I"ve had 20 some; thru birth of children, loss of blood, last 2 were in ~ 1999 before my cancer surgery" (09/16/2015)  . History of hiatal hernia   . History of shingles   . Hx of colonic polyps   . Hyperlipidemia   . Hypertension   . 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[place]) 2000  . Pneumonia    X 1  . Postoperative hematoma involving circulatory system following cardiac catheterization 09/17/2015  . S/P cardiac cath 09/16/15 09/17/2015  . Type II diabetes mellitus ([place])   . 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 Venous insufficiency     Past Surgical History:  Procedure Laterality Date  . CARDIAC CATHETERIZATION  1997; 2008; 09/16/2015  . CARDIAC CATHETERIZATION N/A 09/16/2015   Procedure: Right/Left Heart Cath and Coronary/Graft Angiography;  Surgeon: Lorretta Harp, MD;  Location: [facility];  Service: Cardiovascular;  Laterality: N/A;  . CESAREAN SECTION  1966  . CORONARY ARTERY BYPASS GRAFT  1997   CABG X5  . EYE SURGERY     2 laser surgeries on left eye with implant  . EYE SURGERY Bilateral    cataracts  . FRACTURE SURGERY    . HERNIA REPAIR    . LAPAROSCOPIC ASSISTED VENTRAL HERNIA REPAIR  09/2008    with incarcerated colon;  Dr. Lucia Gaskins  . MOHS SURGERY  06/2016   [facility]  . PATELLA FRACTURE SURGERY Left 11/1994   "crushed my knee"; put in a plate & 6 screws"  . REFRACTIVE SURGERY  Left 07/30/2015  . 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 March Rummage  . SHOULDER ARTHROSCOPY W/ ROTATOR CUFF REPAIR Right 1997  . TONSILLECTOMY  1960s  . TOTAL KNEE ARTHROPLASTY WITH HARDWARE REMOVAL Left 12/1994    (infex, hardware removed )  . TUBAL LIGATION  1966    Social History   Tobacco Use  . Smoking status: Never Smoker  . Smokeless tobacco: Never Used  Substance Use Topics  . Alcohol use: No    Alcohol/week: 0.0 oz  . Drug use: No    Family History  Problem Relation Age of Onset  .  Heart disease Mother 71  . Heart disease Father 32       MI  . Hypertension Child   . Heart attack Child   . Heart disease Maternal Aunt        x 2, all deceased  . Heart disease Maternal Uncle        x 4, all deceased  . Emphysema Brother 82  . Colon cancer Neg Hx   . Breast cancer Neg Hx     Allergies  Allergen Reactions  . Codeine     REACTION: makes her nervous, orTylenol #3  . Ibuprofen     REACTION: nervous  . Meperidine Hcl     REACTION: nasuea and vomitting  . Naproxen Sodium     REACTION: nervous    Medication list has been reviewed and updated.  Current Outpatient Medications on File Prior to Visit  Medication Sig Dispense Refill  . amLODipine (NORVASC) 5 MG tablet TAKE 1 TABLET(5 MG) BY MOUTH DAILY 90 tablet 1  . amoxicillin-clavulanate (AUGMENTIN) 875-125 MG tablet Take 1 tablet by mouth 2 (two) times daily. 20 tablet 0  . aspirin EC 81 MG EC tablet Take 1 tablet (81 mg total) by mouth daily.    Marland Kitchen atenolol (TENORMIN) 100 MG tablet Take 1 tablet (100 mg total) by mouth daily. 90 tablet 3  . atorvastatin (LIPITOR) 20 MG tablet TAKE 1 TABLET(20 MG) BY MOUTH DAILY 30 tablet 11  . azelastine (ASTELIN) 0.1 % nasal spray Place 2 sprays into both nostrils at bedtime as needed for rhinitis. Use in each nostril as directed 30 mL 1  . Blood Glucose Monitoring Suppl (ONE TOUCH ULTRA SYSTEM KIT) W/DEVICE KIT 1 kit by Does not apply route once. 1 each 0  .  cholecalciferol (VITAMIN D) 1000 UNITS tablet Take 1,000 Units by mouth daily.      . clopidogrel (PLAVIX) 75 MG tablet Take 1 tablet (75 mg total) by mouth daily. 90 tablet 3  . clorazepate (TRANXENE) 7.5 MG tablet Take 1 tablet (7.5 mg total) by mouth as needed. 30 tablet 5  . docusate sodium (COLACE) 100 MG capsule Take 200 mg by mouth daily as needed for mild constipation.     . fluticasone (FLONASE) 50 MCG/ACT nasal spray Place 2 sprays into both nostrils daily as needed for allergies or rhinitis. 16 g 5  . fluticasone (FLONASE) 50 MCG/ACT nasal spray Place 2 sprays into both nostrils daily. 16 g 1  . insulin NPH Human (HUMULIN N,NOVOLIN N) 100 UNIT/ML injection 20-38 Units 2 (two) times daily before a meal. Inject 38 units into the skin every morning and inject 20 units into the skin at bedtime.    . insulin regular (NOVOLIN R,HUMULIN R) 100 units/mL injection Inject 16 Units into the skin 2 (two) times daily before a meal.     . Insulin Syringe-Needle U-100 (INSULIN SYRINGE .5CC/31GX5/16") 31G X 5/16" 0.5 ML MISC USE TO INJECT INSULIN 5 TIMES PER DAY 200 each 0  . irbesartan (AVAPRO) 300 MG tablet Take 1 tablet (300 mg total) by mouth daily. 90 tablet 3  . isosorbide mononitrate (IMDUR) 30 MG 24 hr tablet TAKE 1 TABLET(30 MG) BY MOUTH DAILY 90 tablet 3  . liraglutide (VICTOZA) 18 MG/3ML SOPN ADMINISTER 1.2 MG UNDER THE SKIN EVERY DAY 6 mL 0  . metFORMIN (GLUCOPHAGE) 500 MG tablet TAKE 1 TABLET BY MOUTH EVERY MORNING AND 2 TABLETS EVERY EVENING 270 tablet 3  . mometasone (ELOCON) 0.1 % cream Use as  directed    . Multiple Vitamin (MULTIVITAMIN) capsule Take 1 capsule by mouth daily.      Marland Kitchen neomycin-polymyxin-hydrocortisone (CORTISPORIN) otic solution 2-3 drops in each ear three times daily (Patient taking differently: 2-3 drops in each ear three times daily) 10 mL 2  . omeprazole (PRILOSEC) 20 MG capsule Take 1 capsule (20 mg total) by mouth daily. 90 capsule 3  . ONE TOUCH ULTRA TEST test  strip TEST BLOOD SUGAR TWICE DAILY AS DIRECTED 300 each 2  . ranitidine (ZANTAC) 150 MG tablet Take 1 tablet (150 mg total) by mouth at bedtime. 30 tablet 3  . benzonatate (TESSALON) 100 MG capsule Take 1 capsule (100 mg total) by mouth 3 (three) times daily as needed for cough. (Patient not taking: Reported on 08/02/2017) 30 capsule 0   No current facility-administered medications on file prior to visit.     Review of Systems:  As per HPI- otherwise negative. No rash No specific pains Just feels tired and not her usual self    Physical Examination: Vitals:   08/02/17 0941  BP: 124/60  Pulse: 71  Resp: 16  Temp: 98.2 F (36.8 C)  SpO2: 97%   Vitals:   08/02/17 0941  Weight: 167 lb (75.8 kg)  Height: _0  (1.676 m)   Body mass index is 26.95 kg/m. Ideal Body Weight: Weight in (lb) to have BMI = 25: 154.6  GEN: WDWN, NAD, Non-toxic, A & O x 3, looks well and quite spry for age, minimal overweight HEENT: Atraumatic, Normocephalic. Neck supple. No masses, No LAD.  Bilateral TM wnl, oropharynx normal.  PEERL,EOMI.   Ears and Nose: No external deformity. CV: RRR, No M/G/R. No JVD. No thrill. No extra heart sounds. PULM: CTA B, no wheezes, crackles, rhonchi. No retractions. No resp. distress. No accessory muscle use. ABD: S, NT, ND, +BS. No rebound. No HSM.  Belly is benign today  EXTR: No c/c/e NEURO Normal gait.  PSYCH: Normally interactive. Conversant. Not depressed or anxious appearing.  Calm demeanor.   Results for orders placed or performed in visit on 08/02/17  Urine Culture  Result Value Ref Range   MICRO NUMBER: [id number]    SPECIMEN QUALITY: ADEQUATE    Sample Source NOT GIVEN    STATUS: FINAL    Result: No Growth   CBC with Differential/Platelet  Result Value Ref Range   WBC 34.2 Repeated and verified X2. (HH) 4.0 - 10.5 K/uL   RBC 4.07 3.87 - 5.11 Mil/uL   Hemoglobin 12.4 12.0 - 15.0 g/dL   HCT 37.3 36.0 - 46.0 %   MCV 91.6 78.0 - 100.0 fl   MCHC 33.1  30.0 - 36.0 g/dL   RDW 13.4 11.5 - 15.5 %   Platelets 319.0 150.0 - 400.0 K/uL   Neutrophils Relative % 18.7 (L) 43.0 - 77.0 %   Lymphocytes Relative 77.0 Repeated and verified X2. (H) 12.0 - 46.0 %   Monocytes Relative 3.1 3.0 - 12.0 %   Eosinophils Relative 0.9 0.0 - 5.0 %   Basophils Relative 0.3 0.0 - 3.0 %   Neutro Abs 6.4 1.4 - 7.7 K/uL   Lymphs Abs 26.3 (H) 0.7 - 4.0 K/uL   Monocytes Absolute 1.1 (H) 0.1 - 1.0 K/uL   Eosinophils Absolute 0.3 0.0 - 0.7 K/uL   Basophils Absolute 0.1 0.0 - 0.1 K/uL  Basic metabolic panel  Result Value Ref Range   Sodium 140 135 - 145 mEq/L   Potassium 4.0 3.5 - 5.1 mEq/L  Chloride 105 96 - 112 mEq/L   CO2 26 19 - 32 mEq/L   Glucose, Bld 112 (H) 70 - 99 mg/dL   BUN 22 6 - 23 mg/dL   Creatinine, Ser 1.48 (H) 0.40 - 1.20 mg/dL   Calcium 9.6 8.4 - 10.5 mg/dL   GFR 36.26 (L) >60.00 mL/min  POCT urinalysis dipstick  Result Value Ref Range   Color, UA yellow yellow   Clarity, UA clear clear   Glucose, UA negative negative mg/dL   Bilirubin, UA negative negative   Ketones, POC UA negative negative mg/dL   Spec Grav, UA 1.025 1.010 - 1.025   Blood, UA negative negative   pH, UA 5.5 5.0 - 8.0   Protein Ur, POC =100 (A) negative mg/dL   Urobilinogen, UA 0.2 0.2 or 1.0 E.U./dL   Nitrite, UA Negative Negative   Leukocytes, UA Trace (A) Negative  POCT glucose (manual entry)  Result Value Ref Range   POC Glucose 136 (A) 70 - 99 mg/dl    Assessment and Plan: CLL (chronic lymphocytic leukemia) (HCC) - Plan: CBC with Differential/Platelet, Basic metabolic panel  Malaise - Plan: Urine Culture, POCT urinalysis dipstick  Low grade fever  Insulin dependent diabetes mellitus with complications (HCC) - Plan: POCT glucose (manual entry)  History of CLL and IDDM Here today as she is just not feeling herself- she looks well but describes feeling quite tired and not her normal self  She was in a week ago with similar sx, started on augmentin for  possible sinusitis. However despite appropriate treatment she is not feeling much better.  She has not measured a fever today or in the last few days however Consider UTI and will get a urine culture CXR negative last week Will repeat her CBC today as I am concerned that her CLL is becoming active  Asked her to monitor her temperature with a thermometer at home so we can know if she is running a fever Also asked her to return to her usual home glucose monitoring   Signed Lamar Blinks, MD   Received her labs and [facility]- she will speak to Dr. Marin Olp about the next step.   Called pt and explained this plan to her. Asked her to hold metformin for now while I speak to Dr. Dwyane Dee- her creatinine clearance if about 38, which is borderline for continuing to use metformin.  Will run this by Dr. Lenon Ahmadi back from [facility]-  I spoke with [name] and he looked over her labs. He felt as long as she does not have any palpable lymph nodes on her exam, we will just watch and plan to see her 9/5 for her follow-up. Let me know if there is anything else you need!  Linna Hoff pt and relayed this message.  She has felt pretty good today since our visit this am and will continue to closely monitor her condition If any fever or if not doing ok she will seek care immediately   Received her urine culture 8/4- gave her a call She still does not have a lot of energy but has no fever or other specific sx She will reschedule to see Dwyane Dee soon Asked her to continue to rest, and to come and see me or Dr. Larose Kells if not feeling better in a week or so, she is to seek care right away if getting worse

## 2017-08-03 LAB — URINE CULTURE
MICRO NUMBER: 90910640
RESULT: NO GROWTH
SPECIMEN QUALITY: ADEQUATE

## 2017-08-09 ENCOUNTER — Encounter: Payer: Self-pay | Admitting: Endocrinology

## 2017-08-09 ENCOUNTER — Ambulatory Visit (INDEPENDENT_AMBULATORY_CARE_PROVIDER_SITE_OTHER): Payer: Medicare Other | Admitting: Endocrinology

## 2017-08-09 VITALS — BP 134/80 | HR 86 | Ht 66.0 in | Wt 165.0 lb

## 2017-08-09 DIAGNOSIS — Z794 Long term (current) use of insulin: Secondary | ICD-10-CM

## 2017-08-09 DIAGNOSIS — E1165 Type 2 diabetes mellitus with hyperglycemia: Secondary | ICD-10-CM | POA: Diagnosis not present

## 2017-08-09 NOTE — Patient Instructions (Addendum)
Take 10 units R in am and 12 at dinner until eating better  See Dr Larose Kells

## 2017-08-09 NOTE — Progress Notes (Signed)
Patient ID: [name], female   DOB: [birth year], 77 y.o.   MRN: [id number]            Reason for Appointment: Followup for Type 2 Diabetes   History of Present Illness:          Diagnosis: Type 2 diabetes mellitus, date of diagnosis: 1998       Past history:   She was treated with metformin at diagnosis when this had been continued until 2015 Subsequently Amaryl was also added several years ago and this has been continued Her blood sugars were under fair control between 2010 and early 2013 with A1c ranging from 7.4-9.4, mostly under 8% However since 08/2011 her A1c has been mostly over 8%  Insulin was added in 2014 with small doses of Lantus and this has been progressively increased She was started on mealtime insulin on her initial consultation in 9/15 because of high postprandial readings, sometimes over 300 With adding Victoza in 02/2014 her blood sugars were somewhat better with A1c coming down below 8%  Recent history:   INSULIN regimen: Regular 16 units before meals.  NPH 38 units in the morning and 20 hs  Non-insulin hypoglycemic drugs the patient is taking are: Currently off Metformin 500 a.m., 1000 mg p.m., taking Victoza 0.6 mg daily  A1c has been consistently under 7, last 6.4 Also fructosamine of 228 recently indicates good control  Current blood sugar patterns, daily management and problems identified:  Fasting blood sugars: Recently her fasting readings have been relatively good with not as much variability, may have slightly high readings after breakfast but mostly like checking after eating  Postprandial readings:  She is checking her blood sugars between about 9-11 PM and these are generally fairly good with 3 or 4 readings around 200 or more She says that she is not having much of an appetite recently because of intercurrent illness but has not changed her insulin  Insulin regimen:  She is taking the same dose of 38 units of NPH in the morning and 20  at bedtime along with 16 regular insulin twice daily  She does not adjust her regular insulin based on meal size currently  Even with her reduced appetite her blood sugars are not getting lower than usual  VICTOZA: She has gone back on this.  She was tried on Trulicity but she had side effects from this and she is only taking 0.6 mg Victoza.  She thinks she may have had itching with the higher dose Metformin was stopped with her renal function getting worse after discussion with PCP      HYPOGLYCEMIA: She felt hypoglycemic once when she took her insulin in the morning and did not eat for 30 minutes  Glucose monitoring:  done 1-2 times a day         Glucometer: One Touch.       Blood Glucose readings by time of day from download:  Mean values apply above for all meters except median for One Touch  PRE-MEAL Fasting Lunch Dinner Bedtime Overall  Glucose range:  90-160  107-204     Mean/median:  129  171   142   POST-MEAL PC Breakfast PC Lunch PC Dinner  Glucose range:    114-286  Mean/median:    158   Previous readings:  PRE-MEAL Fasting Lunch Dinner Bedtime Overall  Glucose range:  103-201      Mean/median:  154    152   POST-MEAL PC Breakfast PC Lunch  bedtime  Glucose range:    85-259  Mean/median:    150     Self-care: The diet that the patient has been following is: none, usually eating low fat meals Meals: 2-3 meals per day at irregular but overall still about.  Dinner 6-7 pm  Breakfast is  sometimes crackers  cheese toast or oatmeal, yogurt, occ. eating sandwiches for meals, snacks will be with crackers          Dietician visit, most recent: never.  She saw the CDE in 02/2014              Exercise: none except some yard work  Weight history: 150-170 previously  Wt Readings from Last 3 Encounters:  08/09/17 165 lb (74.8 kg)  08/02/17 167 lb (75.8 kg)  07/25/17 172 lb (78 kg)    Glycemic control:   Lab Results  Component Value Date   HGBA1C 6.4 05/24/2017     HGBA1C 6.5 01/26/2017   HGBA1C 5.7 09/19/2016   Lab Results  Component Value Date   MICROALBUR 57.4 (H) 05/24/2017   LDLCALC 41 04/04/2017   CREATININE 1.48 (H) 08/02/2017     OTHER active problems  discussed in review of systems   Allergies as of 08/09/2017      Reactions   Codeine    REACTION: makes her nervous, orTylenol #3   Ibuprofen    REACTION: nervous   Meperidine Hcl    REACTION: nasuea and vomitting   Naproxen Sodium    REACTION: nervous      Medication List        Accurate as of 08/09/17 11:59 PM. Always use your most recent med list.          amLODipine 5 MG tablet Commonly known as:  NORVASC TAKE 1 TABLET(5 MG) BY MOUTH DAILY   aspirin 81 MG EC tablet Take 1 tablet (81 mg total) by mouth daily.   atenolol 100 MG tablet Commonly known as:  TENORMIN Take 1 tablet (100 mg total) by mouth daily.   atorvastatin 20 MG tablet Commonly known as:  LIPITOR TAKE 1 TABLET(20 MG) BY MOUTH DAILY   azelastine 0.1 % nasal spray Commonly known as:  ASTELIN Place 2 sprays into both nostrils at bedtime as needed for rhinitis. Use in each nostril as directed   cholecalciferol 1000 units tablet Commonly known as:  VITAMIN D Take 1,000 Units by mouth daily.   clopidogrel 75 MG tablet Commonly known as:  PLAVIX Take 1 tablet (75 mg total) by mouth daily.   clorazepate 7.5 MG tablet Commonly known as:  TRANXENE Take 1 tablet (7.5 mg total) by mouth as needed.   docusate sodium 100 MG capsule Commonly known as:  COLACE Take 200 mg by mouth daily as needed for mild constipation.   fluticasone 50 MCG/ACT nasal spray Commonly known as:  FLONASE Place 2 sprays into both nostrils daily as needed for allergies or rhinitis.   fluticasone 50 MCG/ACT nasal spray Commonly known as:  FLONASE Place 2 sprays into both nostrils daily.   insulin NPH Human 100 UNIT/ML injection Commonly known as:  HUMULIN N,NOVOLIN N 20-38 Units 2 (two) times daily before a meal.  Inject 38 units into the skin every morning and inject 20 units into the skin at bedtime.   insulin regular 100 units/mL injection Commonly known as:  NOVOLIN R,HUMULIN R Inject 16 Units into the skin 2 (two) times daily before a meal.   INSULIN SYRINGE .5CC/31GX5/16" 31G X 5/16"  0.5 ML Misc USE TO INJECT INSULIN 5 TIMES PER DAY   irbesartan 300 MG tablet Commonly known as:  AVAPRO Take 1 tablet (300 mg total) by mouth daily.   isosorbide mononitrate 30 MG 24 hr tablet Commonly known as:  IMDUR TAKE 1 TABLET(30 MG) BY MOUTH DAILY   liraglutide 18 MG/3ML Sopn Commonly known as:  VICTOZA ADMINISTER 1.2 MG UNDER THE SKIN EVERY DAY   mometasone 0.1 % cream Commonly known as:  ELOCON Use as directed   multivitamin capsule Take 1 capsule by mouth daily.   neomycin-polymyxin-hydrocortisone OTIC solution Commonly known as:  CORTISPORIN 2-3 drops in each ear three times daily   omeprazole 20 MG capsule Commonly known as:  PRILOSEC Take 1 capsule (20 mg total) by mouth daily.   ONE TOUCH ULTRA SYSTEM KIT w/Device Kit 1 kit by Does not apply route once.   ONE TOUCH ULTRA TEST test strip Generic drug:  glucose blood TEST BLOOD SUGAR TWICE DAILY AS DIRECTED   ranitidine 150 MG tablet Commonly known as:  ZANTAC Take 1 tablet (150 mg total) by mouth at bedtime.       Allergies:  Allergies  Allergen Reactions  . Codeine     REACTION: makes her nervous, orTylenol #3  . Ibuprofen     REACTION: nervous  . Meperidine Hcl     REACTION: nasuea and vomitting  . Naproxen Sodium     REACTION: nervous    Past Medical History:  Diagnosis Date  . Acute blood loss anemia 09/17/2015  . Anemia   . Anginal pain ([place]) 2017  . Arthritis    "hands and knees mostly" (09/16/2015)  . B12 deficiency anemia   . CAD (coronary artery disease)    Dr Gwenlyn Found  . 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 Depression   . DJD (degenerative joint disease)   . Dupuytren's contracture  of both hands 08/30/2014  . Gastritis   . GERD (gastroesophageal reflux disease)   . Headache(784.0)   . History of blood transfusion    "I"ve had 20 some; thru birth of children, loss of blood, last 2 were in ~ 1999 before my cancer surgery" (09/16/2015)  . History of hiatal hernia   . History of shingles   . Hx of colonic polyps   . Hyperlipidemia   . Hypertension   . 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 Pneumonia    X 1  . Postoperative hematoma involving circulatory system following cardiac catheterization 09/17/2015  . S/P cardiac cath 09/16/15 09/17/2015  . Type II diabetes mellitus ([place])   . 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 Venous insufficiency     Past Surgical History:  Procedure Laterality Date  . CARDIAC CATHETERIZATION  1997; 2008; 09/16/2015  . CARDIAC CATHETERIZATION N/A 09/16/2015   Procedure: Right/Left Heart Cath and Coronary/Graft Angiography;  Surgeon: Lorretta Harp, MD;  Location: [facility];  Service: Cardiovascular;  Laterality: N/A;  . CESAREAN SECTION  1966  . CORONARY ARTERY BYPASS GRAFT  1997   CABG X5  . EYE SURGERY     2 laser surgeries on left eye with implant  . EYE SURGERY Bilateral    cataracts  . FRACTURE SURGERY    . HERNIA REPAIR    . LAPAROSCOPIC ASSISTED VENTRAL HERNIA REPAIR  09/2008    with incarcerated colon;  Dr. Lucia Gaskins  . MOHS SURGERY  06/2016   [facility]  .  PATELLA FRACTURE SURGERY Left 11/1994   "crushed my knee"; put in a plate & 6 screws"  . REFRACTIVE SURGERY Left 07/30/2015  . 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 March Rummage  . SHOULDER ARTHROSCOPY W/ ROTATOR CUFF REPAIR Right 1997  . TONSILLECTOMY  1960s  . TOTAL KNEE ARTHROPLASTY WITH HARDWARE REMOVAL Left 12/1994    (infex, hardware removed )  . TUBAL LIGATION  1966    Family History  Problem Relation Age of Onset  . Heart disease Mother 34  . Heart disease Father 72       MI  . Hypertension Child   . Heart  attack Child   . Heart disease Maternal Aunt        x 2, all deceased  . Heart disease Maternal Uncle        x 4, all deceased  . Emphysema Brother 26  . Colon cancer Neg Hx   . Breast cancer Neg Hx     Social History:  reports that she has never smoked. She has never used smokeless tobacco. She reports that she does not drink alcohol or use drugs.    Review of Systems         Lipids: Has had excellent control of hypercholesterolemia with long-term use of Lipitor She has a history of coronary bypass surgery.   Also has been  on Niaspan, Prescribed by PCP However still tends to have low HDL and slightly high triglycerides       Lab Results  Component Value Date   CHOL 114 04/04/2017   HDL 38.30 (L) 04/04/2017   LDLCALC 41 04/04/2017   LDLDIRECT 36.0 05/26/2016   TRIG 174.0 (H) 04/04/2017   CHOLHDL 3 04/04/2017                  The blood pressure has been Treated with atenolol 100 mg and half of 300 mg Avapro  Also on amlodipine 5 mg  Also does have mildly high microalbumin level  BP Readings from Last 3 Encounters:  08/09/17 134/80  08/02/17 124/60  07/25/17 135/74     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 creatinine Is upper normal usually, recently appears to be higher but she has had some intercurrent illnesses    Lab Results  Component Value Date   CREATININE 1.48 (H) 08/02/2017   CREATININE 1.48 (H) 07/30/2017   CREATININE 1.19 05/24/2017       No history of Numbness, tingling or burning in feet   Diabetic foot exam Done in 5/18 showing mild neuropathy    Physical Examination:  BP 134/80 (BP Location: Left Arm, Patient Position: Sitting, Cuff Size: Normal)   Pulse 86   Ht _0  (1.676 m)   Wt 165 lb (74.8 kg)   SpO2 97%   BMI 26.63 kg/m     Diabetes type 2, insulin requiring with BMI 29   See history of present illness for detailed discussion of her current blood sugar patterns, management and problems identified  Her blood sugars are looking fairly good even  with taking only 0.6 mg Victoza Her average blood sugar at home is under 150 Considering her age and comorbid conditions her control is excellent with last A1c 6.4 and fructosamine 228 now  She does have fluctuation in her blood sugars as before because of taking NPH and regular A1c is fairly good but her blood sugars are averaging about 150 even though her A1c indicates lower readings Currently because of decreased appetite she can  probably not go up on the higher dose of Victoza even though it is doubtful that she was having itching just with a higher dose of 1.2 mg as she believes   RENAL insufficiency: Her creatinine is worse and she was asked to follow-up with her PCP next week Etiology not clear currently, blood pressure is not unusually low  HYPERTENSION: Blood pressure is controlled    PLAN:   Currently since her appetite is decreased she can reduce her morning NPH to 10 units and evening dose to 12 units  She should try and take her insulin within 15 to 20 minutes of her planned meal and not delay further  To try and check blood sugars more after breakfast  Continue 0.6 mg Victoza but she is having higher postprandial readings may go back up to 1.2  She will need to discuss all of her other problems with PCP  For her symptoms of post voiding dysuria she should try Vagisil or Monistat  May consider restarting metformin when renal function better   Elayne Snare 08/10/2017, 4:43 PM   Note: This office note was prepared with Dragon voice recognition system technology. Any transcriptional errors that result from this process are unintentional.

## 2017-08-30 ENCOUNTER — Other Ambulatory Visit: Payer: Medicare Other

## 2017-08-30 ENCOUNTER — Ambulatory Visit: Payer: Medicare Other | Admitting: Hematology & Oncology

## 2017-09-06 ENCOUNTER — Other Ambulatory Visit: Payer: Self-pay

## 2017-09-06 ENCOUNTER — Encounter: Payer: Self-pay | Admitting: Family

## 2017-09-06 ENCOUNTER — Inpatient Hospital Stay: Payer: Medicare Other | Attending: Hematology & Oncology

## 2017-09-06 ENCOUNTER — Inpatient Hospital Stay (HOSPITAL_BASED_OUTPATIENT_CLINIC_OR_DEPARTMENT_OTHER): Payer: Medicare Other | Admitting: Family

## 2017-09-06 VITALS — BP 154/70 | HR 66 | Temp 97.8°F | Resp 19 | Wt 170.0 lb

## 2017-09-06 DIAGNOSIS — Z85038 Personal history of other malignant neoplasm of large intestine: Secondary | ICD-10-CM | POA: Insufficient documentation

## 2017-09-06 DIAGNOSIS — Z862 Personal history of diseases of the blood and blood-forming organs and certain disorders involving the immune mechanism: Secondary | ICD-10-CM | POA: Diagnosis not present

## 2017-09-06 DIAGNOSIS — C9110 Chronic lymphocytic leukemia of B-cell type not having achieved remission: Secondary | ICD-10-CM

## 2017-09-06 LAB — CBC WITH DIFFERENTIAL/PLATELET
Band Neutrophils: 0 %
Basophils Absolute: 0 10*3/uL (ref 0.0–0.1)
Basophils Relative: 0 %
Blasts: 0 %
EOS ABS: 0.4 10*3/uL (ref 0.0–0.5)
EOS PCT: 1 %
HCT: 37.7 % (ref 36.0–46.0)
Hemoglobin: 12.1 g/dL (ref 12.0–15.0)
Lymphocytes Relative: 78 %
Lymphs Abs: 29.3 10*3/uL — ABNORMAL HIGH (ref 0.9–3.3)
MCH: 30.6 pg (ref 26.0–34.0)
MCHC: 32.1 g/dL (ref 30.0–36.0)
MCV: 95.2 fL (ref 78.0–100.0)
METAMYELOCYTES PCT: 0 %
MONO ABS: 1.5 10*3/uL — AB (ref 0.1–0.9)
Monocytes Relative: 4 %
Myelocytes: 0 %
NEUTROS PCT: 17 %
Neutro Abs: 6.4 10*3/uL (ref 1.5–6.5)
Platelets: 209 10*3/uL (ref 150–400)
Promyelocytes Relative: 0 %
RBC: 3.96 MIL/uL (ref 3.87–5.11)
RDW: 14.1 % (ref 11.5–15.5)
WBC: 37.5 10*3/uL — AB (ref 3.9–10.0)
nRBC: 0 /100 WBC

## 2017-09-06 LAB — CMP (CANCER CENTER ONLY)
ALK PHOS: 96 U/L (ref 38–126)
ALT: 17 U/L (ref 0–44)
ANION GAP: 10 (ref 5–15)
AST: 24 U/L (ref 15–41)
Albumin: 3.8 g/dL (ref 3.5–5.0)
BILIRUBIN TOTAL: 0.4 mg/dL (ref 0.3–1.2)
BUN: 22 mg/dL (ref 8–23)
CALCIUM: 9.9 mg/dL (ref 8.9–10.3)
CO2: 27 mmol/L (ref 22–32)
Chloride: 107 mmol/L (ref 98–111)
Creatinine: 1.48 mg/dL — ABNORMAL HIGH (ref 0.44–1.00)
GFR, EST NON AFRICAN AMERICAN: 33 mL/min — AB (ref >60)
GFR, Est AFR Am: 38 mL/min — ABNORMAL LOW (ref >60)
Glucose, Bld: 124 mg/dL — ABNORMAL HIGH (ref 70–99)
POTASSIUM: 4.6 mmol/L (ref 3.5–5.1)
Sodium: 144 mmol/L (ref 135–145)
TOTAL PROTEIN: 7 g/dL (ref 6.5–8.1)

## 2017-09-06 LAB — LACTATE DEHYDROGENASE: LDH: 175 U/L (ref 98–192)

## 2017-09-06 LAB — TECHNOLOGIST SMEAR REVIEW

## 2017-09-06 NOTE — Progress Notes (Signed)
Hematology and Oncology Follow Up Visit  [place] [id number] 08/02/1940 77 y.o. 09/06/2017   Principle Diagnosis:  CLL -stage A Remote history of colon cancer Iron deficiency anemia  Current Therapy:   IV iron as indicated-patient received a dose in September 2018   Interim History: [name] is here today for follow-up. She is doing well and has no complaints at this time.  She recently had sinusitis with bronchitis and was treated with Augmentin and Flonase. This has since resolved.  Her WBC count is stable at 37.5, Hgb 12.1, MCV 95.2, platelet count 209.  No fever, chills, n/v, cough, rash, dizziness, SOB, chest pain, palpitations, abdominal pain or changes in bowel or bladder habits.  She said she does not feel that her bladder empties all the way. She states that this is not a new issue and is unchanged. No burning/pain with urination.  No swelling, tenderness, numbness or tingling in her extremities. No c/o pain.  No lymphadenopathy noted on her exam.  No episodes or bleeding, no bruising or petechiae.  She has a good appetite and is staying well hydrated. Her weight is stable.   ECOG Performance Status: 1 - Symptomatic but completely ambulatory  Medications:  Allergies as of 09/06/2017      Reactions   Codeine    REACTION: makes her nervous, orTylenol #3   Ibuprofen    REACTION: nervous   Meperidine Hcl    REACTION: nasuea and vomitting   Naproxen Sodium    REACTION: nervous      Medication List        Accurate as of 09/06/17  2:06 PM. Always use your most recent med list.          amLODipine 5 MG tablet Commonly known as:  NORVASC TAKE 1 TABLET(5 MG) BY MOUTH DAILY   aspirin 81 MG EC tablet Take 1 tablet (81 mg total) by mouth daily.   atenolol 100 MG tablet Commonly known as:  TENORMIN Take 1 tablet (100 mg total) by mouth daily.   atorvastatin 20 MG tablet Commonly known as:  LIPITOR TAKE 1 TABLET(20 MG) BY MOUTH DAILY   azelastine 0.1 %  nasal spray Commonly known as:  ASTELIN Place 2 sprays into both nostrils at bedtime as needed for rhinitis. Use in each nostril as directed   cholecalciferol 1000 units tablet Commonly known as:  VITAMIN D Take 1,000 Units by mouth daily.   clopidogrel 75 MG tablet Commonly known as:  PLAVIX Take 1 tablet (75 mg total) by mouth daily.   clorazepate 7.5 MG tablet Commonly known as:  TRANXENE Take 1 tablet (7.5 mg total) by mouth as needed.   docusate sodium 100 MG capsule Commonly known as:  COLACE Take 200 mg by mouth daily as needed for mild constipation.   fluticasone 50 MCG/ACT nasal spray Commonly known as:  FLONASE Place 2 sprays into both nostrils daily as needed for allergies or rhinitis.   fluticasone 50 MCG/ACT nasal spray Commonly known as:  FLONASE Place 2 sprays into both nostrils daily.   insulin NPH Human 100 UNIT/ML injection Commonly known as:  HUMULIN N,NOVOLIN N 20-38 Units 2 (two) times daily before a meal. Inject 38 units into the skin every morning and inject 20 units into the skin at bedtime.   insulin regular 100 units/mL injection Commonly known as:  NOVOLIN R,HUMULIN R Inject 16 Units into the skin 2 (two) times daily before a meal.   INSULIN SYRINGE .5CC/31GX5/16" 31G X 5/16"  0.5 ML Misc USE TO INJECT INSULIN 5 TIMES PER DAY   irbesartan 300 MG tablet Commonly known as:  AVAPRO Take 1 tablet (300 mg total) by mouth daily.   isosorbide mononitrate 30 MG 24 hr tablet Commonly known as:  IMDUR TAKE 1 TABLET(30 MG) BY MOUTH DAILY   liraglutide 18 MG/3ML Sopn Commonly known as:  VICTOZA ADMINISTER 1.2 MG UNDER THE SKIN EVERY DAY   mometasone 0.1 % cream Commonly known as:  ELOCON Use as directed   multivitamin capsule Take 1 capsule by mouth daily.   neomycin-polymyxin-hydrocortisone OTIC solution Commonly known as:  CORTISPORIN 2-3 drops in each ear three times daily   omeprazole 20 MG capsule Commonly known as:  PRILOSEC Take 1  capsule (20 mg total) by mouth daily.   ONE TOUCH ULTRA SYSTEM KIT w/Device Kit 1 kit by Does not apply route once.   ONE TOUCH ULTRA TEST test strip Generic drug:  glucose blood TEST BLOOD SUGAR TWICE DAILY AS DIRECTED   ranitidine 150 MG tablet Commonly known as:  ZANTAC Take 1 tablet (150 mg total) by mouth at bedtime.       Allergies:  Allergies  Allergen Reactions  . Codeine     REACTION: makes her nervous, orTylenol #3  . Ibuprofen     REACTION: nervous  . Meperidine Hcl     REACTION: nasuea and vomitting  . Naproxen Sodium     REACTION: nervous    Past Medical History, Surgical history, Social history, and Family History were reviewed and updated.  Review of Systems: All other 10 point review of systems is negative.   Physical Exam:  weight is 170 lb (77.1 kg). Her oral temperature is 97.8 F (36.6 C). Her blood pressure is 154/70 (abnormal) and her pulse is 66. Her respiration is 19 and oxygen saturation is 100%.   Wt Readings from Last 3 Encounters:  09/06/17 170 lb (77.1 kg)  08/09/17 165 lb (74.8 kg)  08/02/17 167 lb (75.8 kg)    Ocular: Sclerae unicteric, pupils equal, round and reactive to light Ear-nose-throat: Oropharynx clear, dentition fair Lymphatic: No cervical, supraclavicular or axillary adenopathy Lungs no rales or rhonchi, good excursion bilaterally Heart regular rate and rhythm, no murmur appreciated Abd soft, nontender, positive bowel sounds, no liver or spleen tip palpated on exam, no fluid wave  MSK no focal spinal tenderness, no joint edema Neuro: non-focal, well-oriented, appropriate affect Breasts: Deferred   Lab Results  Component Value Date   WBC 37.5 (H) 09/06/2017   HGB 12.1 09/06/2017   HCT 37.7 09/06/2017   MCV 95.2 09/06/2017   PLT 209 09/06/2017   Lab Results  Component Value Date   FERRITIN 58 03/08/2017   IRON 69 03/08/2017   TIBC 308 03/08/2017   UIBC 239 03/08/2017   IRONPCTSAT 22 03/08/2017   Lab Results    Component Value Date   RBC 3.96 09/06/2017   Lab Results  Component Value Date   KAPLAMBRATIO 1.22 11/03/2016   Lab Results  Component Value Date   IGGSERUM 844 11/03/2016   IGA 281 02/25/2014   IGMSERUM 27 11/03/2016   Lab Results  Component Value Date   TOTALPROTELP 7.2 02/25/2014   ALBUMINELP 54.3 (L) 02/25/2014   A1GS 4.4 02/25/2014   A2GS 13.7 (H) 02/25/2014   BETS 7.0 02/25/2014   BETA2SER 6.2 02/25/2014   GAMS 14.4 02/25/2014   MSPIKE Not Observed 11/03/2016   SPEI * 02/25/2014     Chemistry  Component Value Date/Time   NA 140 08/02/2017 1044   NA 147 (H) 11/03/2016 1018   NA 143 08/30/2016 0954   K 4.0 08/02/2017 1044   K 3.9 11/03/2016 1018   K 4.5 08/30/2016 0954   CL 105 08/02/2017 1044   CL 110 (H) 11/03/2016 1018   CO2 26 08/02/2017 1044   CO2 27 11/03/2016 1018   CO2 24 08/30/2016 0954   BUN 22 08/02/2017 1044   BUN 25 (H) 11/03/2016 1018   BUN 20.5 08/30/2016 0954   CREATININE 1.48 (H) 08/02/2017 1044   CREATININE 1.42 (H) 03/08/2017 1121   CREATININE 1.6 (H) 11/03/2016 1018   CREATININE 1.4 (H) 08/30/2016 0954      Component Value Date/Time   CALCIUM 9.6 08/02/2017 1044   CALCIUM 10.1 11/03/2016 1018   CALCIUM 9.8 08/30/2016 0954   ALKPHOS 76 05/24/2017 1339   ALKPHOS 112 (H) 11/03/2016 1018   ALKPHOS 113 08/30/2016 0954   AST 22 05/24/2017 1339   AST 25 03/08/2017 1121   AST 23 08/30/2016 0954   ALT 21 05/24/2017 1339   ALT 21 03/08/2017 1121   ALT 29 11/03/2016 1018   ALT 20 08/30/2016 0954   BILITOT 0.4 05/24/2017 1339   BILITOT 0.5 03/08/2017 1121   BILITOT 0.38 08/30/2016 0954      Impression and Plan: [name] is a very pleasant 77 yo caucasian female with CLL stage A. She also has history of colon cancer as well as iron deficiency.  She is doing well and is asymptomatic at this time.  Her WBC count is stable at 37.5. Dr. Marin Olp was able to review her lab work and no intervention needed at this time.  We will  continue to follow along with her and plan to see her back in another 6 months.  She will contact our office with any questions or concerns. We can certainly see her sooner if need be.   Laverna Peace, NP 9/5/20192:06 PM

## 2017-09-19 ENCOUNTER — Other Ambulatory Visit: Payer: Self-pay | Admitting: Medical

## 2017-09-24 ENCOUNTER — Ambulatory Visit: Payer: Medicare Other | Admitting: Endocrinology

## 2017-09-25 ENCOUNTER — Other Ambulatory Visit: Payer: Self-pay | Admitting: Endocrinology

## 2017-10-03 NOTE — Progress Notes (Addendum)
Subjective:   Jamiyah H Deike is a 77 y.o. female who presents for Medicare Annual (Subsequent) preventive examination.  Review of Systems: No ROS.  Medicare Wellness Visit. Additional risk factors are reflected in the social history. Cardiac Risk Factors include: advanced age (>59mn, >>8women);diabetes mellitus;dyslipidemia;hypertension Sleep patterns: Sleeps well. Home Safety/Smoke Alarms: Feels safe in home. Smoke alarms in place. Lives alone in 1 story home.  Female:     Mammo- utd      Dexa scan- utd       CCS- due 11/2017 Eye- Dr. SBaird Canceryearly. UTD per pt    Objective:     Vitals: BP (!) 142/78 (BP Location: Left Arm, Patient Position: Sitting, Cuff Size: Normal)   Pulse 68   Ht 5' 6"  (1.676 m)   Wt 174 lb 12.8 oz (79.3 kg)   SpO2 98%   BMI 28.21 kg/m   Body mass index is 28.21 kg/m.  Advanced Directives 10/04/2017 09/06/2017 11/27/2016 11/03/2016 10/12/2016 08/30/2016 01/07/2016  Does Patient Have a Medical Advance Directive? Yes No Yes No Yes No Yes  Type of AParamedicof ASt. BerniceLiving will - HV[place]Living will - [place]owpensLiving will - [place]k ParkLiving will  Does patient want to make changes to medical advance directive? - - - - - - -  Copy of [place] Chart? No - copy requested - - - No - copy requested - No - copy requested  Would patient like information on creating a medical advance directive? - - - - - - -    Tobacco Social History   Tobacco Use  Smoking Status Never Smoker  Smokeless Tobacco Never Used     Counseling given: Not Answered   Clinical Intake:     Pain : No/denies pain                 Past Medical History:  Diagnosis Date  . Acute blood loss anemia 09/17/2015  . Anemia   . Anginal pain (HCable 2017  . Arthritis    "hands and knees mostly" (09/16/2015)  . B12 deficiency anemia   . CAD (coronary artery disease)    Dr BGwenlyn Found . CLL (chronic lymphocytic leukemia) ([place] 02/25/2014  . Depression   . DJD (degenerative joint disease)   . 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 Gastritis   . GERD (gastroesophageal reflux disease)   . Headache(784.0)   . History of blood transfusion    "I"ve had 20 some; thru birth of children, loss of blood, last 2 were in ~ 1999 before my cancer surgery" (09/16/2015)  . History of hiatal hernia   . History of shingles   . Hx of colonic polyps   . Hyperlipidemia   . Hypertension   . 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[place] 2000  . Pneumonia    X 1  . Postoperative hematoma involving circulatory system following cardiac catheterization 09/17/2015  . S/P cardiac cath 09/16/15 09/17/2015  . Type II diabetes mellitus ([place]   . 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 Venous insufficiency    Past Surgical History:  Procedure Laterality Date  . CARDIAC CATHETERIZATION  1997; 2008; 09/16/2015  . CARDIAC CATHETERIZATION N/A 09/16/2015   Procedure: Right/Left Heart Cath and Coronary/Graft Angiography;  Surgeon: JLorretta Harp MD;  [facility];  Service: Cardiovascular;  Laterality: N/A;  . CESAREAN SECTION  1966  . CORONARY ARTERY BYPASS GRAFT  1997   CABG X5  . EYE SURGERY     2 laser surgeries on left eye with implant  . EYE SURGERY Bilateral    cataracts  . FRACTURE SURGERY    . HERNIA REPAIR    . LAPAROSCOPIC ASSISTED VENTRAL HERNIA REPAIR  09/2008    with incarcerated colon;  Dr. Lucia Gaskins  . MOHS SURGERY  06/2016   [facility]  . PATELLA FRACTURE SURGERY Left 11/1994   "crushed my knee"; put in a plate & 6 screws"  . REFRACTIVE SURGERY Left 07/30/2015  . 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 March Rummage  . SHOULDER ARTHROSCOPY W/ ROTATOR CUFF REPAIR Right 1997  . TONSILLECTOMY  1960s  . TOTAL KNEE ARTHROPLASTY WITH HARDWARE REMOVAL Left 12/1994    (infex, hardware removed )  . TUBAL LIGATION  1966   Family  History  Problem Relation Age of Onset  . Heart disease Mother 91  . Heart disease Father 27       MI  . Hypertension Child   . Heart attack Child   . Heart disease Maternal Aunt        x 2, all deceased  . Heart disease Maternal Uncle        x 4, all deceased  . Emphysema Brother 68  . Colon cancer Neg Hx   . Breast cancer Neg Hx    Social History   Socioeconomic History  . Marital status: Widowed    Spouse name: Not on file  . Number of children: 2  . Years of education: Not on file  . Highest education level: Not on file  Occupational History  . Occupation: retired-- Research officer, trade union: RETIRED  Social Needs  . Financial resource strain: Not on file  . Food insecurity:    Worry: Not on file    Inability: Not on file  . Transportation needs:    Medical: Not on file    Non-medical: Not on file  Tobacco Use  . Smoking status: Never Smoker  . Smokeless tobacco: Never Used  Substance and Sexual Activity  . Alcohol use: No    Alcohol/week: 0.0 standard drinks  . Drug use: No  . Sexual activity: Never  Lifestyle  . Physical activity:    Days per week: Not on file    Minutes per session: Not on file  . Stress: Not on file  Relationships  . Social connections:    Talks on phone: Not on file    Gets together: Not on file    Attends religious service: Not on file    Active member of club or organization: Not on file    Attends meetings of clubs or organizations: Not on file    Relationship status: Not on file  Other Topics Concern  . Not on file  Social History Narrative   Lives by herself, lost husband ~ 2004    1 child in [place]   I child in [place]    Outpatient Encounter Medications as of 10/04/2017  Medication Sig  . amLODipine (NORVASC) 5 MG tablet TAKE 1 TABLET(5 MG) BY MOUTH DAILY  . aspirin EC 81 MG EC tablet Take 1 tablet (81 mg total) by mouth daily.  Marland Kitchen atenolol (TENORMIN) 100 MG tablet Take 1 tablet (100 mg total) by mouth daily.  Marland Kitchen atorvastatin  (LIPITOR) 20 MG tablet TAKE 1 TABLET(20 MG) BY MOUTH DAILY  . azelastine (ASTELIN)  0.1 % nasal spray Place 2 sprays into both nostrils at bedtime as needed for rhinitis. Use in each nostril as directed  . Blood Glucose Monitoring Suppl (ONE TOUCH ULTRA SYSTEM KIT) W/DEVICE KIT 1 kit by Does not apply route once.  . cholecalciferol (VITAMIN D) 1000 UNITS tablet Take 1,000 Units by mouth daily.    . clopidogrel (PLAVIX) 75 MG tablet Take 1 tablet (75 mg total) by mouth daily.  . clorazepate (TRANXENE) 7.5 MG tablet Take 1 tablet (7.5 mg total) by mouth as needed.  . docusate sodium (COLACE) 100 MG capsule Take 200 mg by mouth daily as needed for mild constipation.   . fluticasone (FLONASE) 50 MCG/ACT nasal spray SHAKE LIQUID AND USE 2 SPRAYS IN EACH NOSTRIL DAILY  . insulin NPH Human (HUMULIN N,NOVOLIN N) 100 UNIT/ML injection 20-38 Units 2 (two) times daily before a meal. Inject 38 units into the skin every morning and inject 20 units into the skin at bedtime.  . insulin regular (NOVOLIN R,HUMULIN R) 100 units/mL injection Inject 16 Units into the skin 2 (two) times daily before a meal.   . Insulin Syringe-Needle U-100 (INSULIN SYRINGE .5CC/31GX5/16") 31G X 5/16" 0.5 ML MISC USE TO INJECT INSULIN 5 TIMES PER DAY  . irbesartan (AVAPRO) 300 MG tablet Take 1 tablet (300 mg total) by mouth daily.  . isosorbide mononitrate (IMDUR) 30 MG 24 hr tablet TAKE 1 TABLET(30 MG) BY MOUTH DAILY  . liraglutide (VICTOZA) 18 MG/3ML SOPN ADMINISTER 1.2 MG UNDER THE SKIN EVERY DAY  . mometasone (ELOCON) 0.1 % cream Use as directed  . Multiple Vitamin (MULTIVITAMIN) capsule Take 1 capsule by mouth daily.    Marland Kitchen neomycin-polymyxin-hydrocortisone (CORTISPORIN) otic solution 2-3 drops in each ear three times daily (Patient taking differently: 2-3 drops in each ear three times daily)  . omeprazole (PRILOSEC) 20 MG capsule Take 1 capsule (20 mg total) by mouth daily.  . ONE TOUCH ULTRA TEST test strip TEST BLOOD SUGAR TWICE  DAILY AS DIRECTED  . ranitidine (ZANTAC) 150 MG tablet Take 1 tablet (150 mg total) by mouth at bedtime. (Patient not taking: Reported on 10/04/2017)   No facility-administered encounter medications on file as of 10/04/2017.     Activities of Daily Living In your present state of health, do you have any difficulty performing the following activities: 10/04/2017 04/04/2017  Hearing? N N  Vision? N N  Difficulty concentrating or making decisions? N N  Walking or climbing stairs? N N  Dressing or bathing? N N  Doing errands, shopping? N N  Preparing Food and eating ? N -  Using the Toilet? N -  In the past six months, have you accidently leaked urine? N -  Do you have problems with loss of bowel control? N -  Managing your Medications? N -  Managing your Finances? N -  Housekeeping or managing your Housekeeping? N -  Some recent data might be hidden    Patient Care Team: Colon Branch, MD as PCP - General (Internal Medicine) Marin Olp Rudell Cobb, MD as Consulting Physician (Oncology) Elayne Snare, MD as Consulting Physician (Endocrinology) Druscilla Brownie, MD as Consulting Physician (Dermatology)    Assessment:   This is a routine wellness examination for Oralia. Physical assessment deferred to PCP.  Exercise Activities and Dietary recommendations Current Exercise Habits: The patient does not participate in regular exercise at present, Exercise limited by: None identified Diet (meal preparation, eat out, water intake, caffeinated beverages, dairy products, fruits and vegetables): well balanced  Goals    . increase water intake.       Fall Risk Fall Risk  10/04/2017 04/04/2017 01/07/2016 08/30/2015 08/30/2015  Falls in the past year? No No No No No   Depression Screen PHQ 2/9 Scores 10/04/2017 04/04/2017 01/07/2016 08/30/2015  PHQ - 2 Score 0 0 0 0     Cognitive Function Ad8 score reviewed for issues:  Issues making decisions:no  Less interest in hobbies / activities:no  Repeats  questions, stories (family complaining):no  Trouble using ordinary gadgets (microwave, computer, phone):no  Forgets the month or year: no  Mismanaging finances: no  Remembering appts:no  Daily problems with thinking and/or memory:no Ad8 score is=0   MMSE - Mini Mental State Exam 01/07/2016  Orientation to time 5  Orientation to Place 5  Registration 3  Attention/ Calculation 5  Recall 3  Language- name 2 objects 2  Language- repeat 1  Language- follow 3 step command 3  Language- read & follow direction 1  Write a sentence 1  Copy design 0  Total score 29        Immunization History  Administered Date(s) Administered  . H1N1 12/10/2007  . Influenza Split 10/03/2010, 11/09/2011  . Influenza Whole 10/26/2004, 11/05/2008, 10/15/2009  . Influenza, High Dose Seasonal PF 11/30/2015  . Influenza,inj,Quad PF,6+ Mos 11/12/2012, 11/03/2013, 10/12/2016  . Influenza-Unspecified 11/16/2014  . Pneumococcal Conjugate-13 03/19/2013  . Pneumococcal Polysaccharide-23 10/13/2008, 04/04/2017  . Tdap 09/01/2010  . Zoster 03/19/2013     Screening Tests Health Maintenance  Topic Date Due  . INFLUENZA VACCINE  08/02/2017  . COLONOSCOPY  11/02/2017  . HEMOGLOBIN A1C  11/24/2017  . OPHTHALMOLOGY EXAM  04/18/2018  . FOOT EXAM  05/30/2018  . TETANUS/TDAP  08/31/2020  . DEXA SCAN  Completed  . PNA vac Low Risk Adult  Completed      Plan:    Please schedule your next medicare wellness visit with me in 1 yr.  Continue to eat heart healthy diet (full of fruits, vegetables, whole grains, lean protein, water--limit salt, fat, and sugar intake) and increase physical activity as tolerated.  Continue doing brain stimulating activities (puzzles, reading, adult coloring books, staying active) to keep memory sharp.   Bring a copy of your living will and/or healthcare power of attorney to your next office visit.    I have personally reviewed and noted the following in the patient's chart:     . Medical and social history . Use of alcohol, tobacco or illicit drugs  . Current medications and supplements . Functional ability and status . Nutritional status . Physical activity . Advanced directives . List of other physicians . Hospitalizations, surgeries, and ER visits in previous 12 months . Vitals . Screenings to include cognitive, depression, and falls . Referrals and appointments  In addition, I have reviewed and discussed with patient certain preventive protocols, quality metrics, and best practice recommendations. A written personalized care plan for preventive services as well as general preventive health recommendations were provided to patient.     Naaman Plummer [place], [place]  10/04/2017 Kathlene November, MD

## 2017-10-04 ENCOUNTER — Encounter: Payer: Self-pay | Admitting: Internal Medicine

## 2017-10-04 ENCOUNTER — Encounter: Payer: Self-pay | Admitting: *Deleted

## 2017-10-04 ENCOUNTER — Ambulatory Visit: Payer: Medicare Other | Admitting: Internal Medicine

## 2017-10-04 ENCOUNTER — Ambulatory Visit (INDEPENDENT_AMBULATORY_CARE_PROVIDER_SITE_OTHER): Payer: Medicare Other | Admitting: Internal Medicine

## 2017-10-04 ENCOUNTER — Ambulatory Visit (INDEPENDENT_AMBULATORY_CARE_PROVIDER_SITE_OTHER): Payer: Medicare Other | Admitting: *Deleted

## 2017-10-04 VITALS — BP 142/78 | HR 68 | Ht 66.0 in | Wt 174.8 lb

## 2017-10-04 DIAGNOSIS — E785 Hyperlipidemia, unspecified: Secondary | ICD-10-CM | POA: Diagnosis not present

## 2017-10-04 DIAGNOSIS — Z0000 Encounter for general adult medical examination without abnormal findings: Secondary | ICD-10-CM

## 2017-10-04 DIAGNOSIS — Z23 Encounter for immunization: Secondary | ICD-10-CM | POA: Diagnosis not present

## 2017-10-04 DIAGNOSIS — E118 Type 2 diabetes mellitus with unspecified complications: Secondary | ICD-10-CM

## 2017-10-04 DIAGNOSIS — F329 Major depressive disorder, single episode, unspecified: Secondary | ICD-10-CM | POA: Diagnosis not present

## 2017-10-04 DIAGNOSIS — C9110 Chronic lymphocytic leukemia of B-cell type not having achieved remission: Secondary | ICD-10-CM

## 2017-10-04 DIAGNOSIS — M159 Polyosteoarthritis, unspecified: Secondary | ICD-10-CM

## 2017-10-04 DIAGNOSIS — I10 Essential (primary) hypertension: Secondary | ICD-10-CM | POA: Diagnosis not present

## 2017-10-04 DIAGNOSIS — N183 Chronic kidney disease, stage 3 unspecified: Secondary | ICD-10-CM

## 2017-10-04 DIAGNOSIS — F419 Anxiety disorder, unspecified: Secondary | ICD-10-CM

## 2017-10-04 DIAGNOSIS — K219 Gastro-esophageal reflux disease without esophagitis: Secondary | ICD-10-CM

## 2017-10-04 DIAGNOSIS — M150 Primary generalized (osteo)arthritis: Secondary | ICD-10-CM

## 2017-10-04 MED ORDER — CLORAZEPATE DIPOTASSIUM 7.5 MG PO TABS: 7.5000 mg | ORAL_TABLET | ORAL | 1 refills | Status: DC | PRN

## 2017-10-04 MED ORDER — HYDROCODONE-ACETAMINOPHEN 5-325 MG PO TABS: 1.0000 | ORAL_TABLET | Freq: Three times a day (TID) | ORAL | 0 refills | Status: DC | PRN

## 2017-10-04 NOTE — Patient Instructions (Signed)
Please schedule your next medicare wellness visit with me in 1 yr.  Continue to eat heart healthy diet (full of fruits, vegetables, whole grains, lean protein, water--limit salt, fat, and sugar intake) and increase physical activity as tolerated.  Continue doing brain stimulating activities (puzzles, reading, adult coloring books, staying active) to keep memory sharp.   Bring a copy of your living will and/or healthcare power of attorney to your next office visit.   [name] , Thank you for taking time to come for your Medicare Wellness Visit. I appreciate your ongoing commitment to your health goals. Please review the following plan we discussed and let me know if I can assist you in the future.   These are the goals we discussed: Goals    . increase water intake.       This is a list of the screening recommended for you and due dates:  Health Maintenance  Topic Date Due  . Flu Shot  08/02/2017  . Colon Cancer Screening  11/02/2017  . Hemoglobin A1C  11/24/2017  . Eye exam for diabetics  04/18/2018  . Complete foot exam   05/30/2018  . Tetanus Vaccine  08/31/2020  . DEXA scan (bone density measurement)  Completed  . Pneumonia vaccines  Completed    Health Maintenance for Postmenopausal Women Menopause is a normal process in which your reproductive ability comes to an end. This process happens gradually over a span of months to years, usually between the ages of 46 and 60. Menopause is complete when you have missed 12 consecutive menstrual periods. It is important to talk with your health care provider about some of the most common conditions that affect postmenopausal women, such as heart disease, cancer, and bone loss (osteoporosis). Adopting a healthy lifestyle and getting preventive care can help to promote your health and wellness. Those actions can also lower your chances of developing some of these common conditions. What should I know about menopause? During menopause,  you may experience a number of symptoms, such as:  Moderate-to-severe hot flashes.  Night sweats.  Decrease in sex drive.  Mood swings.  Headaches.  Tiredness.  Irritability.  Memory problems.  Insomnia.  Choosing to treat or not to treat menopausal changes is an individual decision that you make with your health care provider. What should I know about hormone replacement therapy and supplements? Hormone therapy products are effective for treating symptoms that are associated with menopause, such as hot flashes and night sweats. Hormone replacement carries certain risks, especially as you become older. If you are thinking about using estrogen or estrogen with progestin treatments, discuss the benefits and risks with your health care provider. What should I know about heart disease and stroke? Heart disease, heart attack, and stroke become more likely as you age. This may be due, in part, to the hormonal changes that your body experiences during menopause. These can affect how your body processes dietary fats, triglycerides, and cholesterol. Heart attack and stroke are both medical emergencies. There are many things that you can do to help prevent heart disease and stroke:  Have your blood pressure checked at least every 1-2 years. High blood pressure causes heart disease and increases the risk of stroke.  If you are 7-50 years old, ask your health care provider if you should take aspirin to prevent a heart attack or a stroke.  Do not use any tobacco products, including cigarettes, chewing tobacco, or electronic cigarettes. If you need help quitting, ask your health  care provider.  It is important to eat a healthy diet and maintain a healthy weight. ? Be sure to include plenty of vegetables, fruits, low-fat dairy products, and lean protein. ? Avoid eating foods that are high in solid fats, added sugars, or salt (sodium).  Get regular exercise. This is one of the most important  things that you can do for your health. ? Try to exercise for at least 150 minutes each week. The type of exercise that you do should increase your heart rate and make you sweat. This is known as moderate-intensity exercise. ? Try to do strengthening exercises at least twice each week. Do these in addition to the moderate-intensity exercise.  Know your numbers.Ask your health care provider to check your cholesterol and your blood glucose. Continue to have your blood tested as directed by your health care provider.  What should I know about cancer screening? There are several types of cancer. Take the following steps to reduce your risk and to catch any cancer development as early as possible. Breast Cancer  Practice breast self-awareness. ? This means understanding how your breasts normally appear and feel. ? It also means doing regular breast self-exams. Let your health care provider know about any changes, no matter how small.  If you are 8 or older, have a clinician do a breast exam (clinical breast exam or CBE) every year. Depending on your age, family history, and medical history, it may be recommended that you also have a yearly breast X-ray (mammogram).  If you have a family history of breast cancer, talk with your health care provider about genetic screening.  If you are at high risk for breast cancer, talk with your health care provider about having an MRI and a mammogram every year.  Breast cancer (BRCA) gene test is recommended for women who have family members with BRCA-related cancers. Results of the assessment will determine the need for genetic counseling and BRCA1 and for BRCA2 testing. BRCA-related cancers include these types: ? Breast. This occurs in males or females. ? Ovarian. ? Tubal. This may also be called fallopian tube cancer. ? Cancer of the abdominal or pelvic lining (peritoneal cancer). ? Prostate. ? Pancreatic.  Cervical, Uterine, and Ovarian Cancer Your  health care provider may recommend that you be screened regularly for cancer of the pelvic organs. These include your ovaries, uterus, and vagina. This screening involves a pelvic exam, which includes checking for microscopic changes to the surface of your cervix (Pap test).  For women ages 21-65, health care providers may recommend a pelvic exam and a Pap test every three years. For women ages 93-65, they may recommend the Pap test and pelvic exam, combined with testing for human papilloma virus (HPV), every five years. Some types of HPV increase your risk of cervical cancer. Testing for HPV may also be done on women of any age who have unclear Pap test results.  Other health care providers may not recommend any screening for nonpregnant women who are considered low risk for pelvic cancer and have no symptoms. Ask your health care provider if a screening pelvic exam is right for you.  If you have had past treatment for cervical cancer or a condition that could lead to cancer, you need Pap tests and screening for cancer for at least 20 years after your treatment. If Pap tests have been discontinued for you, your risk factors (such as having a new sexual partner) need to be reassessed to determine if you should  start having screenings again. Some women have medical problems that increase the chance of getting cervical cancer. In these cases, your health care provider may recommend that you have screening and Pap tests more often.  If you have a family history of uterine cancer or ovarian cancer, talk with your health care provider about genetic screening.  If you have vaginal bleeding after reaching menopause, tell your health care provider.  There are currently no reliable tests available to screen for ovarian cancer.  Lung Cancer Lung cancer screening is recommended for adults 51-65 years old who are at high risk for lung cancer because of a history of smoking. A yearly low-dose CT scan of the lungs  is recommended if you:  Currently smoke.  Have a history of at least 30 pack-years of smoking and you currently smoke or have quit within the past 15 years. A pack-year is smoking an average of one pack of cigarettes per day for one year.  Yearly screening should:  Continue until it has been 15 years since you quit.  Stop if you develop a health problem that would prevent you from having lung cancer treatment.  Colorectal Cancer  This type of cancer can be detected and can often be prevented.  Routine colorectal cancer screening usually begins at age 62 and continues through age 5.  If you have risk factors for colon cancer, your health care provider may recommend that you be screened at an earlier age.  If you have a family history of colorectal cancer, talk with your health care provider about genetic screening.  Your health care provider may also recommend using home test kits to check for hidden blood in your stool.  A small camera at the end of a tube can be used to examine your colon directly (sigmoidoscopy or colonoscopy). This is done to check for the earliest forms of colorectal cancer.  Direct examination of the colon should be repeated every 5-10 years until age 84. However, if early forms of precancerous polyps or small growths are found or if you have a family history or genetic risk for colorectal cancer, you may need to be screened more often.  Skin Cancer  Check your skin from head to toe regularly.  Monitor any moles. Be sure to tell your health care provider: ? About any new moles or changes in moles, especially if there is a change in a mole's shape or color. ? If you have a mole that is larger than the size of a pencil eraser.  If any of your family members has a history of skin cancer, especially at a young age, talk with your health care provider about genetic screening.  Always use sunscreen. Apply sunscreen liberally and repeatedly throughout the  day.  Whenever you are outside, protect yourself by wearing long sleeves, pants, a wide-brimmed hat, and sunglasses.  What should I know about osteoporosis? Osteoporosis is a condition in which bone destruction happens more quickly than new bone creation. After menopause, you may be at an increased risk for osteoporosis. To help prevent osteoporosis or the bone fractures that can happen because of osteoporosis, the following is recommended:  If you are 43-94 years old, get at least 1,000 mg of calcium and at least 600 mg of vitamin D per day.  If you are older than age 74 but younger than age 64, get at least 1,200 mg of calcium and at least 600 mg of vitamin D per day.  If you are older  than age 71, get at least 1,200 mg of calcium and at least 800 mg of vitamin D per day.  Smoking and excessive alcohol intake increase the risk of osteoporosis. Eat foods that are rich in calcium and vitamin D, and do weight-bearing exercises several times each week as directed by your health care provider. What should I know about how menopause affects my mental health? Depression may occur at any age, but it is more common as you become older. Common symptoms of depression include:  Low or sad mood.  Changes in sleep patterns.  Changes in appetite or eating patterns.  Feeling an overall lack of motivation or enjoyment of activities that you previously enjoyed.  Frequent crying spells.  Talk with your health care provider if you think that you are experiencing depression. What should I know about immunizations? It is important that you get and maintain your immunizations. These include:  Tetanus, diphtheria, and pertussis (Tdap) booster vaccine.  Influenza every year before the flu season begins.  Pneumonia vaccine.  Shingles vaccine.  Your health care provider may also recommend other immunizations. This information is not intended to replace advice given to you by your health care provider.  Make sure you discuss any questions you have with your health care provider. Document Released: 02/10/2005 Document Revised: 07/09/2015 Document Reviewed: 09/22/2014 Elsevier Interactive Patient Education  2018 [employer].

## 2017-10-04 NOTE — Assessment & Plan Note (Signed)
  AWV today DM: Per Endo, last A1c 6.4 on May 2019 HTN: Currently on amlodipine, Tenormin, Avapro, last BMP satisfactory, creatinine at baseline at 1.48.  BP slightly elevated, no change for now Hyperlipidemia: On Lipitor, controlled. Depression anxiety: If Tranxene, take at night rarely. DJD: Refill hydrocodone, takes it sporadically. CAD: Cardiology added Imdur back in April 2019, reports having no further chest pain. GERD: On omeprazole, self DC Zantac.  No symptoms. Had a flu shot CLL, iron deficiency, colon cancer: Last visit with oncology few weeks ago, felt to be stable. RTC 4 to 5 months

## 2017-10-04 NOTE — Progress Notes (Signed)
Pre visit review using our clinic review tool, if applicable. No additional management support is needed unless otherwise documented below in the visit note. 

## 2017-10-04 NOTE — Progress Notes (Signed)
Subjective:    Patient ID: [place], female    DOB: [birth year], 77 y.o.   MRN: [id number]  DOS:  10/04/2017 Type of visit - description : rov Interval history: Here for follow-up. Notes from oncology and cardiology reviewed Recent labs reviewed. Needs a refill on Tranxene and hydrocodone.  BP Readings from Last 3 Encounters:  10/04/17 (!) 142/78  10/04/17 (!) 142/78  09/06/17 (!) 154/70    Review of Systems Reports she is doing well, CBGs were elevated in the last few weeks but they are now coming back to more "normal" levels, CBG  was 124 this morning. Denies chest pain currently.   Past Medical History:  Diagnosis Date  . Acute blood loss anemia 09/17/2015  . Anemia   . Anginal pain ([place]) 2017  . Arthritis    "hands and knees mostly" (09/16/2015)  . B12 deficiency anemia   . CAD (coronary artery disease)    Dr Gwenlyn Found  . 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 Depression   . DJD (degenerative joint disease)   . 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 Gastritis   . GERD (gastroesophageal reflux disease)   . Headache(784.0)   . History of blood transfusion    "I"ve had 20 some; thru birth of children, loss of blood, last 2 were in ~ 1999 before my cancer surgery" (09/16/2015)  . History of hiatal hernia   . History of shingles   . Hx of colonic polyps   . Hyperlipidemia   . Hypertension   . 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 Pneumonia    X 1  . Postoperative hematoma involving circulatory system following cardiac catheterization 09/17/2015  . S/P cardiac cath 09/16/15 09/17/2015  . Type II diabetes mellitus ([place])   . 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)    as a child  . Venous insufficiency     Past Surgical History:  Procedure Laterality Date  . CARDIAC CATHETERIZATION  1997; 2008; 09/16/2015  . CARDIAC CATHETERIZATION N/A 09/16/2015   Procedure: Right/Left Heart Cath and Coronary/Graft Angiography;   Surgeon: Lorretta Harp, MD;  Location: [facility];  Service: Cardiovascular;  Laterality: N/A;  . CESAREAN SECTION  1966  . CORONARY ARTERY BYPASS GRAFT  1997   CABG X5  . EYE SURGERY     2 laser surgeries on left eye with implant  . EYE SURGERY Bilateral    cataracts  . FRACTURE SURGERY    . HERNIA REPAIR    . LAPAROSCOPIC ASSISTED VENTRAL HERNIA REPAIR  09/2008    with incarcerated colon;  Dr. Lucia Gaskins  . MOHS SURGERY  06/2016   BCC  . PATELLA FRACTURE SURGERY Left 11/1994   "crushed my knee"; put in a plate & 6 screws"  . REFRACTIVE SURGERY Left 07/30/2015  . 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 March Rummage  . SHOULDER ARTHROSCOPY W/ ROTATOR CUFF REPAIR Right 1997  . TONSILLECTOMY  1960s  . TOTAL KNEE ARTHROPLASTY WITH HARDWARE REMOVAL Left 12/1994    (infex, hardware removed )  . TUBAL LIGATION  1966    Social History   Socioeconomic History  . Marital status: Widowed    Spouse name: Not on file  . Number of children: 2  . Years of education: Not on file  . Highest education level: Not on file  Occupational History  . Occupation: retired-- Research officer, trade union: RETIRED  Social Needs  .  Financial resource strain: Not on file  . Food insecurity:    Worry: Not on file    Inability: Not on file  . Transportation needs:    Medical: Not on file    Non-medical: Not on file  Tobacco Use  . Smoking status: Never Smoker  . Smokeless tobacco: Never Used  Substance and Sexual Activity  . Alcohol use: No    Alcohol/week: 0.0 standard drinks  . Drug use: No  . Sexual activity: Not Currently  Lifestyle  . Physical activity:    Days per week: Not on file    Minutes per session: Not on file  . Stress: Not on file  Relationships  . Social connections:    Talks on phone: Not on file    Gets together: Not on file    Attends religious service: Not on file    Active member of club or organization: Not on file    Attends meetings of clubs or  organizations: Not on file    Relationship status: Not on file  . Intimate partner violence:    Fear of current or ex partner: Not on file    Emotionally abused: Not on file    Physically abused: Not on file    Forced sexual activity: Not on file  Other Topics Concern  . Not on file  Social History Narrative   Lives by herself, lost husband ~ 2004    1 child in [place]   I child in [place] as of 10/04/2017      Reactions   Codeine    REACTION: makes her nervous, orTylenol #3   Ibuprofen    REACTION: nervous   Meperidine Hcl    REACTION: nasuea and vomitting   Naproxen Sodium    REACTION: nervous      Medication List        Accurate as of 10/04/17  9:33 PM. Always use your most recent med list.          amLODipine 5 MG tablet Commonly known as:  NORVASC TAKE 1 TABLET(5 MG) BY MOUTH DAILY   aspirin 81 MG EC tablet Take 1 tablet (81 mg total) by mouth daily.   atenolol 100 MG tablet Commonly known as:  TENORMIN Take 1 tablet (100 mg total) by mouth daily.   atorvastatin 20 MG tablet Commonly known as:  LIPITOR TAKE 1 TABLET(20 MG) BY MOUTH DAILY   azelastine 0.1 % nasal spray Commonly known as:  ASTELIN Place 2 sprays into both nostrils at bedtime as needed for rhinitis. Use in each nostril as directed   cholecalciferol 1000 units tablet Commonly known as:  VITAMIN D Take 1,000 Units by mouth daily.   clopidogrel 75 MG tablet Commonly known as:  PLAVIX Take 1 tablet (75 mg total) by mouth daily.   clorazepate 7.5 MG tablet Commonly known as:  TRANXENE Take 1 tablet (7.5 mg total) by mouth as needed.   docusate sodium 100 MG capsule Commonly known as:  COLACE Take 200 mg by mouth daily as needed for mild constipation.   fluticasone 50 MCG/ACT nasal spray Commonly known as:  FLONASE SHAKE LIQUID AND USE 2 SPRAYS IN EACH NOSTRIL DAILY   HYDROcodone-acetaminophen 5-325 MG tablet Commonly known as:  NORCO/VICODIN Take 1 tablet by mouth 3  (three) times daily as needed for moderate pain.   insulin NPH Human 100 UNIT/ML injection Commonly known as:  HUMULIN N,NOVOLIN N 20-38 Units 2 (two) times daily  before a meal. Inject 38 units into the skin every morning and inject 20 units into the skin at bedtime.   insulin regular 100 units/mL injection Commonly known as:  NOVOLIN R,HUMULIN R Inject 16 Units into the skin 2 (two) times daily before a meal.   INSULIN SYRINGE .5CC/31GX5/16" 31G X 5/16" 0.5 ML Misc USE TO INJECT INSULIN 5 TIMES PER DAY   irbesartan 300 MG tablet Commonly known as:  AVAPRO Take 1 tablet (300 mg total) by mouth daily.   isosorbide mononitrate 30 MG 24 hr tablet Commonly known as:  IMDUR TAKE 1 TABLET(30 MG) BY MOUTH DAILY   liraglutide 18 MG/3ML Sopn Commonly known as:  VICTOZA ADMINISTER 1.2 MG UNDER THE SKIN EVERY DAY   mometasone 0.1 % cream Commonly known as:  ELOCON Use as directed   multivitamin capsule Take 1 capsule by mouth daily.   neomycin-polymyxin-hydrocortisone OTIC solution Commonly known as:  CORTISPORIN 2-3 drops in each ear three times daily   omeprazole 20 MG capsule Commonly known as:  PRILOSEC Take 1 capsule (20 mg total) by mouth daily.   ONE TOUCH ULTRA SYSTEM KIT w/Device Kit 1 kit by Does not apply route once.   ONE TOUCH ULTRA TEST test strip Generic drug:  glucose blood TEST BLOOD SUGAR TWICE DAILY AS DIRECTED          Objective:   Physical Exam BP (!) 142/78 (BP Location: Left Arm, Patient Position: Sitting, Cuff Size: Normal)   Pulse 68   Ht _0  (1.676 m)   Wt 174 lb 12 oz (79.3 kg)   SpO2 98%   BMI 28.21 kg/m  General:   Well developed, NAD, see BMI.  HEENT:  Normocephalic . Face symmetric, atraumatic Lungs:  CTA B Normal respiratory effort, no intercostal retractions, no accessory muscle use. Heart: RRR,  no murmur.  No pretibial edema bilaterally  Skin: Not pale. Not jaundice Neurologic:  alert & oriented X3.  Speech normal, gait  appropriate for age and unassisted Psych--  Cognition and judgment appear intact.  Cooperative with normal attention span and concentration.  Behavior appropriate. No anxious or depressed appearing.       Assessment & Plan:      Assessment DM  Dr Dwyane Dee  +retinopathy  HTN Hyperlipidemia CRI creat ~1.4  (GFR ~ 38) Depression/anxiety: tranxene qhs prn (takes rarely) MSK: -DJD: hydrocodone (rx pcp) - T score -0.8 on December 2017, h/o a foot FX d/t walking years ago (likely a stress fracture), discussed treatment 04/2016:  exercise and cont Vit d Hem/Onc: Dr Marin Olp  -CLL -iron deficiency anemia: IV iron 2018 -B12 def CAD, Dr Gwenlyn Found MI 89 >> CABG, cath 12-13-2006, myoview 2012 no ischemic CP: Stress test 08/05/2015, indeterminate risk study, + lateral ischemia: Cardiac catheterization 09/16/2015: Rx medical Venous insuff GI:  Dr Silverio Decamp ---GERD, IBS, h/o Gastritis ---H/o ulcerative colitis as a child H/o shingles  BCC Dr Allyson Sabal, bx 04-2015, MOH's 06-2016   PLAN AWV today DM: Per Endo, last A1c 6.4 on May 2019 HTN: Currently on amlodipine, Tenormin, Avapro, last BMP satisfactory, creatinine at baseline at 1.48.  BP slightly elevated, no change for now Hyperlipidemia: On Lipitor, controlled. Depression anxiety: If Tranxene, take at night rarely. DJD: Refill hydrocodone, takes it sporadically. CAD: Cardiology added Imdur back in April 2019, reports having no further chest pain. GERD: On omeprazole, self DC Zantac.  No symptoms. Had a flu shot CLL, iron deficiency, colon cancer: Last visit with oncology few weeks ago, felt to be  stable. RTC 4 to 5 months

## 2017-10-04 NOTE — Patient Instructions (Signed)
  GO TO THE FRONT DESK Schedule your next appointment for a checkup in  4 to 5  months

## 2017-10-05 ENCOUNTER — Other Ambulatory Visit (INDEPENDENT_AMBULATORY_CARE_PROVIDER_SITE_OTHER): Payer: Medicare Other

## 2017-10-05 DIAGNOSIS — E1165 Type 2 diabetes mellitus with hyperglycemia: Secondary | ICD-10-CM

## 2017-10-05 DIAGNOSIS — Z794 Long term (current) use of insulin: Secondary | ICD-10-CM | POA: Diagnosis not present

## 2017-10-05 LAB — COMPREHENSIVE METABOLIC PANEL
ALBUMIN: 3.8 g/dL (ref 3.5–5.2)
ALK PHOS: 86 U/L (ref 39–117)
ALT: 19 U/L (ref 0–35)
AST: 23 U/L (ref 0–37)
BILIRUBIN TOTAL: 0.4 mg/dL (ref 0.2–1.2)
BUN: 25 mg/dL — ABNORMAL HIGH (ref 6–23)
CALCIUM: 9.1 mg/dL (ref 8.4–10.5)
CO2: 28 mEq/L (ref 19–32)
CREATININE: 1.62 mg/dL — AB (ref 0.40–1.20)
Chloride: 108 mEq/L (ref 96–112)
GFR: 32.65 mL/min — AB (ref >60.00)
Glucose, Bld: 175 mg/dL — ABNORMAL HIGH (ref 70–99)
Potassium: 4.1 mEq/L (ref 3.5–5.1)
Sodium: 140 mEq/L (ref 135–145)
Total Protein: 6.6 g/dL (ref 6.0–8.3)

## 2017-10-05 LAB — HEMOGLOBIN A1C: HEMOGLOBIN A1C: 6.9 % — AB (ref 4.6–6.5)

## 2017-10-08 ENCOUNTER — Other Ambulatory Visit: Payer: Self-pay | Admitting: Cardiovascular Disease

## 2017-10-08 NOTE — Progress Notes (Signed)
Patient ID: [name], female   DOB: [birth year], 77 y.o.   MRN: [id number]            Reason for Appointment: Followup for Type 2 Diabetes   History of Present Illness:          Diagnosis: Type 2 diabetes mellitus, date of diagnosis: 1998       Past history:   She was treated with metformin at diagnosis when this had been continued until 2015 Subsequently Amaryl was also added several years ago and this has been continued Her blood sugars were under fair control between 2010 and early 2013 with A1c ranging from 7.4-9.4, mostly under 8% However since 08/2011 her A1c has been mostly over 8%  Insulin was added in 2014 with small doses of Lantus and this has been progressively increased She was started on mealtime insulin on her initial consultation in 9/15 because of high postprandial readings, sometimes over 300 With adding Victoza in 02/2014 her blood sugars were somewhat better with A1c coming down below 8%  Recent history:   INSULIN regimen: Regular 16 units before meals.  NPH 38 units in the morning and 20 hs  Non-insulin hypoglycemic drugs the patient is taking are: Victoza 1.2 mg daily  A1c has been consistently under 7, now 6.9 compared to 6.4 Last fructosamine of 228   Current blood sugar patterns, daily management and problems identified:  Fasting blood sugars: Overall her fasting readings have been significantly high With her starting to improve her food intake her blood sugar started going up, previously her insulin had to be reduced and also Victoza More recently her fasting readings in the last week have been as low as 124 but not consistently good also   Postprandial readings:  She is checking her blood sugars between about 9-11 PM but not after breakfast As above her blood sugars were higher but in the last week or so more consistently near normal and as low as 122, in the last month as high as 318 No hypoglycemia also  Insulin regimen:  She is back  to taking the previous dose of 38 units of NPH in the morning and 20 at bedtime  She takes the same amount of insulin for breakfast and dinner but not clear what her blood sugars are after breakfast  She does not adjust her regular insulin based on meal size and this causes some variability in readings after supper  She does try to take insulin 30 minutes before eating  VICTOZA: She has gone back to 1.2 mg when her appetite is improved and her blood sugars were higher about 3 weeks ago Although she thinks she was having itching from that she is not complaining of this now  Metformin was stopped with her renal function getting worse after discussion with PCP      HYPOGLYCEMIA: None   Glucose monitoring:  done 1-2 times a day         Glucometer: One Touch.       Blood Glucose readings by time of day from download:   PRE-MEAL Fasting Lunch Dinner Bedtime Overall  Glucose range:  124-208     122-318  Mean/median: 155    168   POST-MEAL PC Breakfast PC Lunch PC Dinner  Glucose range:   122-318  Mean/median:   180    Previous readings:  Mean values apply above for all meters except median for One Touch  PRE-MEAL Fasting Lunch Dinner Bedtime Overall  Glucose range:  90-160  107-204     Mean/median:  129  171   142   POST-MEAL PC Breakfast PC Lunch PC Dinner  Glucose range:    114-286  Mean/median:    158      Self-care: The diet that the patient has been following is: none, usually eating low fat meals Meals: 2-3 meals per day   Dinner 6-7 pm  Breakfast is  sometimes crackers  cheese toast or oatmeal, yogurt, occ. eating sandwiches for meals, snacks will be with crackers          Dietician visit, most recent: never.  She saw the CDE in 02/2014              Exercise: yard work  Weight history: 150-170 previously  Wt Readings from Last 3 Encounters:  10/09/17 176 lb (79.8 kg)  10/04/17 174 lb 12 oz (79.3 kg)  10/04/17 174 lb 12.8 oz (79.3 kg)    Glycemic control:    Lab Results  Component Value Date   HGBA1C 6.9 (H) 10/05/2017   HGBA1C 6.4 05/24/2017   HGBA1C 6.5 01/26/2017   Lab Results  Component Value Date   MICROALBUR 57.4 (H) 05/24/2017   LDLCALC 41 04/04/2017   CREATININE 1.62 (H) 10/05/2017     OTHER active problems  discussed in review of systems   Allergies as of 10/09/2017      Reactions   Codeine    REACTION: makes her nervous, orTylenol #3   Ibuprofen    REACTION: nervous   Meperidine Hcl    REACTION: nasuea and vomitting   Naproxen Sodium    REACTION: nervous      Medication List        Accurate as of 10/09/17 10:38 AM. Always use your most recent med list.          amLODipine 5 MG tablet Commonly known as:  NORVASC TAKE 1 TABLET(5 MG) BY MOUTH DAILY   aspirin 81 MG EC tablet Take 1 tablet (81 mg total) by mouth daily.   atenolol 100 MG tablet Commonly known as:  TENORMIN Take 1 tablet (100 mg total) by mouth daily.   atorvastatin 20 MG tablet Commonly known as:  LIPITOR TAKE 1 TABLET(20 MG) BY MOUTH DAILY   azelastine 0.1 % nasal spray Commonly known as:  ASTELIN Place 2 sprays into both nostrils at bedtime as needed for rhinitis. Use in each nostril as directed   cholecalciferol 1000 units tablet Commonly known as:  VITAMIN D Take 1,000 Units by mouth daily.   clopidogrel 75 MG tablet Commonly known as:  PLAVIX Take 1 tablet (75 mg total) by mouth daily.   clorazepate 7.5 MG tablet Commonly known as:  TRANXENE Take 1 tablet (7.5 mg total) by mouth as needed.   docusate sodium 100 MG capsule Commonly known as:  COLACE Take 200 mg by mouth daily as needed for mild constipation.   fluticasone 50 MCG/ACT nasal spray Commonly known as:  FLONASE SHAKE LIQUID AND USE 2 SPRAYS IN EACH NOSTRIL DAILY   HYDROcodone-acetaminophen 5-325 MG tablet Commonly known as:  NORCO/VICODIN Take 1 tablet by mouth 3 (three) times daily as needed for moderate pain.   insulin NPH Human 100 UNIT/ML  injection Commonly known as:  HUMULIN N,NOVOLIN N 20-38 Units 2 (two) times daily before a meal. Inject 38 units into the skin every morning and inject 20 units into the skin at bedtime.   insulin regular 100 units/mL injection Commonly known as:  NOVOLIN  R,HUMULIN R Inject 16 Units into the skin 2 (two) times daily before a meal.   INSULIN SYRINGE .5CC/31GX5/16" 31G X 5/16" 0.5 ML Misc USE TO INJECT INSULIN 5 TIMES PER DAY   irbesartan 300 MG tablet Commonly known as:  AVAPRO Take 1 tablet (300 mg total) by mouth daily.   isosorbide mononitrate 30 MG 24 hr tablet Commonly known as:  IMDUR TAKE 1 TABLET(30 MG) BY MOUTH DAILY   liraglutide 18 MG/3ML Sopn Commonly known as:  VICTOZA ADMINISTER 1.2 MG UNDER THE SKIN EVERY DAY   mometasone 0.1 % cream Commonly known as:  ELOCON Use as directed   multivitamin capsule Take 1 capsule by mouth daily.   neomycin-polymyxin-hydrocortisone OTIC solution Commonly known as:  CORTISPORIN 2-3 drops in each ear three times daily   omeprazole 20 MG capsule Commonly known as:  PRILOSEC Take 1 capsule (20 mg total) by mouth daily.   ONE TOUCH ULTRA SYSTEM KIT w/Device Kit 1 kit by Does not apply route once.   ONE TOUCH ULTRA TEST test strip Generic drug:  glucose blood TEST BLOOD SUGAR TWICE DAILY AS DIRECTED       Allergies:  Allergies  Allergen Reactions  . Codeine     REACTION: makes her nervous, orTylenol #3  . Ibuprofen     REACTION: nervous  . Meperidine Hcl     REACTION: nasuea and vomitting  . Naproxen Sodium     REACTION: nervous    Past Medical History:  Diagnosis Date  . Acute blood loss anemia 09/17/2015  . Anemia   . Anginal pain ([place]) 2017  . Arthritis    "hands and knees mostly" (09/16/2015)  . B12 deficiency anemia   . CAD (coronary artery disease)    Dr Gwenlyn Found  . CLL (chronic lymphocytic leukemia) ([place]) 02/25/2014  . Depression   . DJD (degenerative joint disease)   . Dupuytren's contracture of both  hands 08/30/2014  . Gastritis   . GERD (gastroesophageal reflux disease)   . Headache(784.0)   . History of blood transfusion    "I"ve had 20 some; thru birth of children, loss of blood, last 2 were in ~ 1999 before my cancer surgery" (09/16/2015)  . History of hiatal hernia   . History of shingles   . Hx of colonic polyps   . Hyperlipidemia   . Hypertension   . 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 Pneumonia    X 1  . Postoperative hematoma involving circulatory system following cardiac catheterization 09/17/2015  . S/P cardiac cath 09/16/15 09/17/2015  . Type II diabetes mellitus ([place])   . 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[facility])    as a child  . Venous insufficiency     Past Surgical History:  Procedure Laterality Date  . CARDIAC CATHETERIZATION  1997; 2008; 09/16/2015  . CARDIAC CATHETERIZATION N/A 09/16/2015   Procedure: Right/Left Heart Cath and Coronary/Graft Angiography;  Surgeon: Lorretta Harp, MD;  Location: [facility];  Service: Cardiovascular;  Laterality: N/A;  . CESAREAN SECTION  1966  . CORONARY ARTERY BYPASS GRAFT  1997   CABG X5  . EYE SURGERY     2 laser surgeries on left eye with implant  . EYE SURGERY Bilateral    cataracts  . FRACTURE SURGERY    . HERNIA REPAIR    . LAPAROSCOPIC ASSISTED VENTRAL HERNIA REPAIR  09/2008    with incarcerated colon;  Dr. Lucia Gaskins  . MOHS SURGERY  06/2016   [facility]  .  PATELLA FRACTURE SURGERY Left 11/1994   "crushed my knee"; put in a plate & 6 screws"  . REFRACTIVE SURGERY Left 07/30/2015  . 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 March Rummage  . SHOULDER ARTHROSCOPY W/ ROTATOR CUFF REPAIR Right 1997  . TONSILLECTOMY  1960s  . TOTAL KNEE ARTHROPLASTY WITH HARDWARE REMOVAL Left 12/1994    (infex, hardware removed )  . TUBAL LIGATION  1966    Family History  Problem Relation Age of Onset  . Heart disease Mother 15  . Heart disease Father 13       MI  . Hypertension Child   . Heart attack  Child   . Heart disease Maternal Aunt        x 2, all deceased  . Heart disease Maternal Uncle        x 4, all deceased  . Emphysema Brother 68  . Colon cancer Neg Hx   . Breast cancer Neg Hx     Social History:  reports that she has never smoked. She has never used smokeless tobacco. She reports that she does not drink alcohol or use drugs.    Review of Systems         Lipids: Has had excellent control of hypercholesterolemia with long-term use of Lipitor She has a history of coronary bypass surgery.   Also has been  on Niaspan, Prescribed by PCP Last labs as follows:       Lab Results  Component Value Date   CHOL 114 04/04/2017   HDL 38.30 (L) 04/04/2017   LDLCALC 41 04/04/2017   LDLDIRECT 36.0 05/26/2016   TRIG 174.0 (H) 04/04/2017   CHOLHDL 3 04/04/2017                  The blood pressure has been Treated with atenolol 100 mg and half of 300 mg Avapro  Also on amlodipine 5 mg Followed by PCP Blood pressure is improved  She does have mildly high microalbumin level  BP Readings from Last 3 Encounters:  10/09/17 122/60  10/04/17 (!) 142/78  10/04/17 (!) 142/78     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 creatinine has been higher this year and now increased compared to last month Not on diuretics    Lab Results  Component Value Date   CREATININE 1.62 (H) 10/05/2017   CREATININE 1.48 (H) 09/06/2017   CREATININE 1.48 (H) 08/02/2017       No history of Numbness, tingling or burning in feet   Diabetic foot exam Done in 5/19 showing mild neuropathy    Physical Examination:  BP 122/60   Pulse 96   Ht _0  (1.676 m)   Wt 176 lb (79.8 kg)   SpO2 96%   BMI 28.41 kg/m   Standing blood pressure 124/62  Diabetes type 2, insulin requiring with BMI 29   See history of present illness for detailed discussion of her current blood sugar patterns, management and problems identified  Her blood sugars are variable over the last month but increased until about a week ago because of  her previously needing to reduce her insulin and Victoza She does have variability related to her food intake, intercurrent illnesses, types of insulin that she is taking and not adjusting insulin based on what she is eating  RENAL insufficiency: Her creatinine is worse and she was asked to follow-up with her PCP and also discussed with cardiologist Also has mild microalbuminuria  HYPERTENSION: Blood pressure is controlled  PLAN:   No change in insulin at present  Does need to start checking some readings after breakfast and not every morning  May consider restarting metformin if renal function better  There are no Patient Instructions on file for this visit.   Elayne Snare 10/09/2017, 10:38 AM   Note: This office note was prepared with Dragon voice recognition system technology. Any transcriptional errors that result from this process are unintentional.

## 2017-10-09 ENCOUNTER — Encounter: Payer: Self-pay | Admitting: Endocrinology

## 2017-10-09 ENCOUNTER — Ambulatory Visit (INDEPENDENT_AMBULATORY_CARE_PROVIDER_SITE_OTHER): Payer: Medicare Other | Admitting: Endocrinology

## 2017-10-09 VITALS — BP 122/60 | HR 96 | Ht 66.0 in | Wt 176.0 lb

## 2017-10-09 DIAGNOSIS — E1165 Type 2 diabetes mellitus with hyperglycemia: Secondary | ICD-10-CM

## 2017-10-09 DIAGNOSIS — Z794 Long term (current) use of insulin: Secondary | ICD-10-CM | POA: Diagnosis not present

## 2017-10-12 ENCOUNTER — Encounter: Payer: Self-pay | Admitting: Cardiovascular Disease

## 2017-10-12 ENCOUNTER — Ambulatory Visit (INDEPENDENT_AMBULATORY_CARE_PROVIDER_SITE_OTHER): Payer: Medicare Other | Admitting: Cardiovascular Disease

## 2017-10-12 DIAGNOSIS — I10 Essential (primary) hypertension: Secondary | ICD-10-CM

## 2017-10-12 DIAGNOSIS — Z951 Presence of aortocoronary bypass graft: Secondary | ICD-10-CM

## 2017-10-12 DIAGNOSIS — E785 Hyperlipidemia, unspecified: Secondary | ICD-10-CM

## 2017-10-12 NOTE — Assessment & Plan Note (Signed)
History of CABG in 1997 catheterization performed by myself in the setting of chest pain 09/16/2015 revealing patent grafts.  She currently denies chest pain.

## 2017-10-12 NOTE — Assessment & Plan Note (Signed)
History of essential hypertension her blood pressure measured at 120/86.  She is on atenolol, amlodipine and Avapro.  Her hydrochlorothiazide was apparently discontinued by her endocrinologist because of rising creatinine.

## 2017-10-12 NOTE — Progress Notes (Signed)
10/12/2017 [place]   07-19-1940  [id number]  Primary Physician Colon Branch, MD Primary Cardiologist: Lorretta Harp MD Lupe Carney, [place]  HPI:  [name] is a 77 y.o.  mildly overweight widowed Caucasian female mother of 2 living children (son at age 64 died of an overdose), grandmother of 5 grandchildren is a patient of Dr. Terance Ice. I last saw her in the office  10/20/2016. Her cardiac risk factor profile is walkover treated diabetes, hypertension and hyperlipidemia. She has a strong family history of heart disease with a father who died of a myocardial infarction at age 83 and her mother at age 38. She had a heart attack in 1997 at which time I performed cardiac catheterization. Apparently I placed a balloon pump at that time and sent her to open heart surgery. Dr. Tharon Aquas Trigt performed coronary artery bypass grafting x5. She had her last catheterization performed by Dr. Rollene Fare December 2008 revealing patent grafts with normal LV function. She had a negative Myoview back in 2012. Since I saw her years ago she's had several episodes of chest pain most recent one this past Sunday which was more intense and prolonged.She had a Myoview stress test performed 08/05/15 that showed new anterolateral ischemia. She's had several episodes of chest pain since I saw her back a month ago. She underwent outpatient cardiac catheterization 09/16/15 revealing patent grafts with an unbypassed nondominant circumflex are normal LV function. Medical therapy was recommended. She really has had no significant chest pain since her heart catheterization. She saw [name] in the office 04/19/16 with atypical chest pain, dyspnea or lower extremity edema. A 2-D echo was ordered and was normal. 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 She did get iron infusion by Dr. Elnoria Howard . Her symptoms have somewhat improved.  Since I saw her a year ago  she has seen [name] 04/19/2017 at which time he adjusted her M door.  She is had no recurrent chest pain.  She is somewhat worried about her renal function with elevated creatinine followed by Dr. Dwyane Dee.  Current Meds  Medication Sig  . amLODipine (NORVASC) 5 MG tablet TAKE 1 TABLET(5 MG) BY MOUTH DAILY  . aspirin EC 81 MG EC tablet Take 1 tablet (81 mg total) by mouth daily.  Marland Kitchen atenolol (TENORMIN) 100 MG tablet Take 1 tablet (100 mg total) by mouth daily.  Marland Kitchen atorvastatin (LIPITOR) 20 MG tablet TAKE 1 TABLET(20 MG) BY MOUTH DAILY  . azelastine (ASTELIN) 0.1 % nasal spray Place 2 sprays into both nostrils at bedtime as needed for rhinitis. Use in each nostril as directed  . Blood Glucose Monitoring Suppl (ONE TOUCH ULTRA SYSTEM KIT) W/DEVICE KIT 1 kit by Does not apply route once.  . cholecalciferol (VITAMIN D) 1000 UNITS tablet Take 1,000 Units by mouth daily.    . clopidogrel (PLAVIX) 75 MG tablet Take 1 tablet (75 mg total) by mouth daily.  . clorazepate (TRANXENE) 7.5 MG tablet Take 1 tablet (7.5 mg total) by mouth as needed.  . docusate sodium (COLACE) 100 MG capsule Take 200 mg by mouth daily as needed for mild constipation.   . fluticasone (FLONASE) 50 MCG/ACT nasal spray SHAKE LIQUID AND USE 2 SPRAYS IN EACH NOSTRIL DAILY  . HYDROcodone-acetaminophen (NORCO/VICODIN) 5-325 MG tablet Take 1 tablet by mouth 3 (three) times daily as needed for moderate pain.  Marland Kitchen insulin NPH Human (HUMULIN N,NOVOLIN  N) 100 UNIT/ML injection 20-38 Units 2 (two) times daily before a meal. Inject 38 units into the skin every morning and inject 20 units into the skin at bedtime.  . insulin regular (NOVOLIN R,HUMULIN R) 100 units/mL injection Inject 16 Units into the skin 2 (two) times daily before a meal.   . Insulin Syringe-Needle U-100 (INSULIN SYRINGE .5CC/31GX5/16") 31G X 5/16" 0.5 ML MISC USE TO INJECT INSULIN 5 TIMES PER DAY  . irbesartan (AVAPRO) 300 MG tablet Take 1 tablet (300 mg total) by mouth daily.    . isosorbide mononitrate (IMDUR) 30 MG 24 hr tablet TAKE 1 TABLET(30 MG) BY MOUTH DAILY  . liraglutide (VICTOZA) 18 MG/3ML SOPN ADMINISTER 1.2 MG UNDER THE SKIN EVERY DAY  . mometasone (ELOCON) 0.1 % cream Use as directed  . Multiple Vitamin (MULTIVITAMIN) capsule Take 1 capsule by mouth daily.    Marland Kitchen neomycin-polymyxin-hydrocortisone (CORTISPORIN) otic solution 2-3 drops in each ear three times daily (Patient taking differently: 2-3 drops in each ear three times daily)  . omeprazole (PRILOSEC) 20 MG capsule Take 1 capsule (20 mg total) by mouth daily.  . ONE TOUCH ULTRA TEST test strip TEST BLOOD SUGAR TWICE DAILY AS DIRECTED     Allergies  Allergen Reactions  . Codeine     REACTION: makes her nervous, orTylenol #3  . Ibuprofen     REACTION: nervous  . Meperidine Hcl     REACTION: nasuea and vomitting  . Naproxen Sodium     REACTION: nervous    Social History   Socioeconomic History  . Marital status: Widowed    Spouse name: Not on file  . Number of children: 2  . Years of education: Not on file  . Highest education level: Not on file  Occupational History  . Occupation: retired-- Research officer, trade union: RETIRED  Social Needs  . Financial resource strain: Not on file  . Food insecurity:    Worry: Not on file    Inability: Not on file  . Transportation needs:    Medical: Not on file    Non-medical: Not on file  Tobacco Use  . Smoking status: Never Smoker  . Smokeless tobacco: Never Used  Substance and Sexual Activity  . Alcohol use: No    Alcohol/week: 0.0 standard drinks  . Drug use: No  . Sexual activity: Not Currently  Lifestyle  . Physical activity:    Days per week: Not on file    Minutes per session: Not on file  . Stress: Not on file  Relationships  . Social connections:    Talks on phone: Not on file    Gets together: Not on file    Attends religious service: Not on file    Active member of club or organization: Not on file    Attends meetings of  clubs or organizations: Not on file    Relationship status: Not on file  . Intimate partner violence:    Fear of current or ex partner: Not on file    Emotionally abused: Not on file    Physically abused: Not on file    Forced sexual activity: Not on file  Other Topics Concern  . Not on file  Social History Narrative   Lives by herself, lost husband ~ 2004    1 child in [place]   I child in [place]     Review of Systems: General: negative for chills, fever, night sweats or weight changes.  Cardiovascular: negative for chest  pain, dyspnea on exertion, edema, orthopnea, palpitations, paroxysmal nocturnal dyspnea or shortness of breath Dermatological: negative for rash Respiratory: negative for cough or wheezing Urologic: negative for hematuria Abdominal: negative for nausea, vomiting, diarrhea, bright red blood per rectum, melena, or hematemesis Neurologic: negative for visual changes, syncope, or dizziness All other systems reviewed and are otherwise negative except as noted above.    Blood pressure 128/86, pulse 64, height 5' 6"  (1.676 m), weight 176 lb 3.2 oz (79.9 kg).  General appearance: alert and icteric Neck: no adenopathy, no carotid bruit, no JVD, supple, symmetrical, trachea midline and thyroid not enlarged, symmetric, no tenderness/mass/nodules Lungs: clear to auscultation bilaterally Heart: regular rate and rhythm, S1, S2 normal, no murmur, click, rub or gallop Extremities: extremities normal, atraumatic, no cyanosis or edema Pulses: 2+ and symmetric Skin: Skin color, texture, turgor normal. No rashes or lesions Neurologic: Alert and oriented X 3, normal strength and tone. Normal symmetric reflexes. Normal coordination and gait  EKG sinus rhythm 64 with right bundle branch block.  I personally reviewed this EKG.  ASSESSMENT AND PLAN:   Dyslipidemia History of dyslipidemia on statin therapy with lipid profile performed 04/04/2017 revealing total cholesterol 114 LDL 41 and  HDL of 38.  Essential hypertension History of essential hypertension her blood pressure measured at 120/86.  She is on atenolol, amlodipine and Avapro.  Her hydrochlorothiazide was apparently discontinued by her endocrinologist because of rising creatinine.  Hx of CABG History of CABG in 1997 catheterization performed by myself in the setting of chest pain 09/16/2015 revealing patent grafts.  She currently denies chest pain.      Lorretta Harp MD FACP,FACC,FAHA, [facility] 10/12/2017 10:51 AM

## 2017-10-12 NOTE — Patient Instructions (Signed)
Medication Instructions:  Your physician recommends that you continue on your current medications as directed. Please refer to the Current Medication list given to you today. If you need a refill on your cardiac medications before your next appointment, please call your pharmacy.   Lab work: none If you have labs (blood work) drawn today and your tests are completely normal, you will receive your results only by: Marland Kitchen MyChart Message (if you have MyChart) OR . A paper copy in the mail If you have any lab test that is abnormal or we need to change your treatment, we will call you to review the results.  Testing/Procedures: none  Follow-Up: At [facility], you and your health needs are our priority.  As part of our continuing mission to provide you with exceptional heart care, we have created designated Provider Care Teams.  These Care Teams include your primary Cardiologist (physician) and Advanced Practice Providers (APPs -  Physician Assistants and Nurse Practitioners) who all work together to provide you with the care you need, when you need it. You will need a follow up appointment in 6 months with [name] and 12 months with Dr. Gwenlyn Found.  Please call our office 2 months in advance to schedule this appointment.  You may see Dr. Gwenlyn Found or one of the following Advanced Practice Providers on your designated Care Team:   [name], PA-C Roby Lofts, [place] . Sande Rives, PA-C  Any Other Special Instructions Will Be Listed Below (If Applicable).

## 2017-10-12 NOTE — Assessment & Plan Note (Signed)
History of dyslipidemia on statin therapy with lipid profile performed 04/04/2017 revealing total cholesterol 114 LDL 41 and HDL of 38.

## 2017-10-15 ENCOUNTER — Encounter: Payer: Self-pay | Admitting: Gastroenterology

## 2017-10-15 ENCOUNTER — Telehealth: Payer: Self-pay | Admitting: *Deleted

## 2017-10-15 ENCOUNTER — Ambulatory Visit (INDEPENDENT_AMBULATORY_CARE_PROVIDER_SITE_OTHER): Payer: Medicare Other | Admitting: Gastroenterology

## 2017-10-15 VITALS — BP 124/76 | HR 72 | Ht 66.0 in | Wt 173.0 lb

## 2017-10-15 DIAGNOSIS — Z86010 Personal history of colonic polyps: Secondary | ICD-10-CM

## 2017-10-15 DIAGNOSIS — Z7902 Long term (current) use of antithrombotics/antiplatelets: Secondary | ICD-10-CM | POA: Diagnosis not present

## 2017-10-15 DIAGNOSIS — K219 Gastro-esophageal reflux disease without esophagitis: Secondary | ICD-10-CM | POA: Diagnosis not present

## 2017-10-15 MED ORDER — NA SULFATE-K SULFATE-MG SULF 17.5-3.13-1.6 GM/177ML PO SOLN: 1.0000 | Freq: Once | ORAL | 0 refills | Status: AC

## 2017-10-15 MED ORDER — OMEPRAZOLE 20 MG PO CPDR: 20.0000 mg | DELAYED_RELEASE_CAPSULE | Freq: Every day | ORAL | 3 refills | Status: DC

## 2017-10-15 NOTE — Progress Notes (Signed)
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  She recently remodeled her master back bedroom and bathroom and has some other remodeling plan for the rest of the house.  She was treated with short course of antibiotics for sinusitis and bronchitis in August 2019. She feels heartburn and GERD symptoms are well controlled on PPI daily. Denies any dysphagia, odynophagia, nausea, vomiting, abdominal pain, melena or blood per rectum.  No loss of appetite or weight loss.  Colonoscopy November 2016: 5 small polyps removed, tubular adenomas EGD November 2016 LA grade B erosive esophagitis and gastritis  Outpatient Encounter Medications as of 10/15/2017  Medication Sig  . amLODipine (NORVASC) 5 MG tablet TAKE 1 TABLET(5 MG) BY MOUTH DAILY  . aspirin EC 81 MG EC tablet Take 1 tablet (81 mg total) by mouth daily.  Marland Kitchen atenolol (TENORMIN) 100 MG tablet Take 1 tablet (100 mg total) by mouth daily.  Marland Kitchen atorvastatin (LIPITOR) 20 MG tablet TAKE 1 TABLET(20 MG) BY MOUTH DAILY  . azelastine (ASTELIN) 0.1 % nasal spray Place 2 sprays into both nostrils at bedtime as needed for rhinitis. Use in each nostril as directed  . Blood Glucose Monitoring Suppl (ONE TOUCH ULTRA SYSTEM KIT) W/DEVICE KIT 1 kit by Does not apply route once.  . cholecalciferol (VITAMIN D) 1000 UNITS tablet Take 1,000 Units by mouth daily.    . clopidogrel (PLAVIX) 75 MG tablet Take 1 tablet (75 mg total) by mouth daily.  . clorazepate (TRANXENE) 7.5 MG tablet Take 1 tablet (7.5 mg total) by mouth as  needed.  . docusate sodium (COLACE) 100 MG capsule Take 200 mg by mouth daily as needed for mild constipation.   . fluticasone (FLONASE) 50 MCG/ACT nasal spray SHAKE LIQUID AND USE 2 SPRAYS IN EACH NOSTRIL DAILY  . HYDROcodone-acetaminophen (NORCO/VICODIN) 5-325 MG tablet Take 1 tablet by mouth 3 (three) times daily as needed for moderate pain.  Marland Kitchen insulin NPH Human (HUMULIN N,NOVOLIN N) 100 UNIT/ML injection 20-38 Units 2 (two) times daily before a meal. Inject 38 units into the skin every morning and inject 20 units into the skin at bedtime.  . insulin regular (NOVOLIN R,HUMULIN R) 100 units/mL injection Inject 16 Units into the skin 2 (two) times daily before a meal.   . Insulin Syringe-Needle U-100 (INSULIN SYRINGE .5CC/31GX5/16") 31G X 5/16" 0.5 ML MISC USE TO INJECT INSULIN 5 TIMES PER DAY  . irbesartan (AVAPRO) 300 MG tablet Take 1 tablet (300 mg total) by mouth daily.  . isosorbide mononitrate (IMDUR) 30 MG 24 hr tablet TAKE 1 TABLET(30 MG) BY MOUTH DAILY  . liraglutide (VICTOZA) 18 MG/3ML SOPN ADMINISTER 1.2 MG UNDER THE SKIN EVERY DAY  . mometasone (ELOCON) 0.1 % cream Use as directed  . Multiple Vitamin (MULTIVITAMIN) capsule Take 1 capsule by mouth daily.    Marland Kitchen neomycin-polymyxin-hydrocortisone (CORTISPORIN) otic solution 2-3 drops in each ear three times daily (Patient taking differently: 2-3 drops in each ear three times daily)  . omeprazole (PRILOSEC) 20 MG capsule Take 1 capsule (20  mg total) by mouth daily.  . ONE TOUCH ULTRA TEST test strip TEST BLOOD SUGAR TWICE DAILY AS DIRECTED   No facility-administered encounter medications on file as of 10/15/2017.     Allergies as of 10/15/2017 - Review Complete 10/15/2017  Allergen Reaction Noted  . Codeine    . Ibuprofen    . Meperidine hcl    . Naproxen sodium      Past Medical History:  Diagnosis Date  . Acute blood loss anemia 09/17/2015  . Anemia   . Anginal pain ([place]) 2017  . Arthritis    "hands and knees mostly"  (09/16/2015)  . B12 deficiency anemia   . CAD (coronary artery disease)    Dr Gwenlyn Found  . CLL (chronic lymphocytic leukemia) ([facility]) 02/25/2014  . Depression   . DJD (degenerative joint disease)   . 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 Gastritis   . GERD (gastroesophageal reflux disease)   . Headache(784.0)   . History of blood transfusion    "I"ve had 20 some; thru birth of children, loss of blood, last 2 were in ~ 1999 before my cancer surgery" (09/16/2015)  . History of hiatal hernia   . History of shingles   . Hx of colonic polyps   . Hyperlipidemia   . Hypertension   . 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[place]) 2000  . Pneumonia    X 1  . Postoperative hematoma involving circulatory system following cardiac catheterization 09/17/2015  . S/P cardiac cath 09/16/15 09/17/2015  . Type II diabetes mellitus ([facility])   . 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[facility])    as a child  . Venous insufficiency     Past Surgical History:  Procedure Laterality Date  . CARDIAC CATHETERIZATION  1997; 2008; 09/16/2015  . CARDIAC CATHETERIZATION N/A 09/16/2015   Procedure: Right/Left Heart Cath and Coronary/Graft Angiography;  Surgeon: Lorretta Harp, MD;  Location: [facility];  Service: Cardiovascular;  Laterality: N/A;  . CESAREAN SECTION  1966  . CORONARY ARTERY BYPASS GRAFT  1997   CABG X5  . EYE SURGERY     2 laser surgeries on left eye with implant  . EYE SURGERY Bilateral    cataracts  . FRACTURE SURGERY    . HERNIA REPAIR    . LAPAROSCOPIC ASSISTED VENTRAL HERNIA REPAIR  09/2008    with incarcerated colon;  Dr. Lucia Gaskins  . MOHS SURGERY  06/2016   [facility]  . PATELLA FRACTURE SURGERY Left 11/1994   "crushed my knee"; put in a plate & 6 screws"  . REFRACTIVE SURGERY Left 07/30/2015  . 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 March Rummage  . SHOULDER ARTHROSCOPY W/ ROTATOR CUFF REPAIR Right 1997  . TONSILLECTOMY  1960s  . TOTAL KNEE ARTHROPLASTY WITH HARDWARE REMOVAL  Left 12/1994    (infex, hardware removed )  . TUBAL LIGATION  1966    Family History  Problem Relation Age of Onset  . Heart disease Mother 64  . Heart disease Father 16       MI  . Hypertension Child   . Heart attack Child   . Heart disease Maternal Aunt        x 2, all deceased  . Heart disease Maternal Uncle        x 4, all deceased  . Emphysema Brother 3  . Colon cancer Neg Hx   . Breast cancer Neg Hx     Social History   Socioeconomic History  .  Marital status: Widowed    Spouse name: Not on file  . Number of children: 2  . Years of education: Not on file  . Highest education level: Not on file  Occupational History  . Occupation: retired-- Research officer, trade union: RETIRED  Social Needs  . Financial resource strain: Not on file  . Food insecurity:    Worry: Not on file    Inability: Not on file  . Transportation needs:    Medical: Not on file    Non-medical: Not on file  Tobacco Use  . Smoking status: Never Smoker  . Smokeless tobacco: Never Used  Substance and Sexual Activity  . Alcohol use: No    Alcohol/week: 0.0 standard drinks  . Drug use: No  . Sexual activity: Not Currently  Lifestyle  . Physical activity:    Days per week: Not on file    Minutes per session: Not on file  . Stress: Not on file  Relationships  . Social connections:    Talks on phone: Not on file    Gets together: Not on file    Attends religious service: Not on file    Active member of club or organization: Not on file    Attends meetings of clubs or organizations: Not on file    Relationship status: Not on file  . Intimate partner violence:    Fear of current or ex partner: Not on file    Emotionally abused: Not on file    Physically abused: Not on file    Forced sexual activity: Not on file  Other Topics Concern  . Not on file  Social History Narrative   Lives by herself, lost husband ~ 2004    1 child in [place]   I child in [place]      Review of systems: Review  of Systems  Constitutional: Negative for fever and chills.  Positive for lack of energy HENT: History of her sinus problem Eyes: Negative for blurred vision.  Respiratory: Negative for cough, shortness of breath and wheezing.   Cardiovascular: Negative for chest pain and palpitations.  Gastrointestinal: as per HPI Genitourinary: Negative for dysuria, urgency, frequency and hematuria.  Musculoskeletal: Negative for myalgias, back pain and positive for joint pain.  Skin: Negative for itching and rash.  Neurological: Negative for dizziness, tremors, focal weakness, seizures and loss of consciousness.  Endo/Heme/Allergies: Positive for seasonal allergies.  Psychiatric/Behavioral: Negative for depression, suicidal ideas and hallucinations.  All other systems reviewed and are negative.   Physical Exam: Vitals:   10/15/17 1335  BP: 124/76  Pulse: 72   Body mass index is 27.92 kg/m. Gen:      No acute distress HEENT:  EOMI, sclera anicteric Neck:     No masses; no thyromegaly Lungs:    Clear to auscultation bilaterally; normal respiratory effort CV:         Regular rate and rhythm; no murmurs Abd:      + bowel sounds; soft, non-tender; no palpable masses, no distension Ext:    No edema; adequate peripheral perfusion Skin:      Warm and dry; no rash Neuro: alert and oriented x 3 Psych: normal mood and affect  Data Reviewed:  Reviewed labs, radiology imaging, old records and pertinent past GI work up   Assessment and Plan/Recommendations: 78 yr F with h/o small bowel adenocarcinoma s/p resection 2000 and chemotherapy, CLL, CKD, CAD on chronic plavix, chronic GERD and h/o adenomatous colon polyps X5 in 2016  Due for surveillance colonoscopy in November 2019, will schedule it Will discuss with PMD/cardiology if ok to hold Plavix for 5 days prior to the procedure  GERD symptoms stable, continue Omeprazole 93m daily Continue antireflux measures The risks and benefits as well as  alternatives of endoscopic procedure(s) have been discussed and reviewed. All questions answered. The patient agrees to proceed.    KDamaris Hippo, MD [phone]   CC: PColon Branch MD

## 2017-10-15 NOTE — Patient Instructions (Signed)
You have been scheduled for a colonoscopy. Please follow written instructions given to you at your visit today.  Please pick up your prep supplies at the pharmacy within the next 1-3 days. If you use inhalers (even only as needed), please bring them with you on the day of your procedure. Your physician has requested that you go to www.startemmi.com and enter the access code given to you at your visit today. This web site gives a general overview about your procedure. However, you should still follow specific instructions given to you by our office regarding your preparation for the procedure.  You will be contacted by our office prior to your procedure for directions on holding your Plavix.  If you do not hear from our office 1 week prior to your scheduled procedure, please call [phone] to discuss.   We have sent refills of Omeprazole to your pharmacy  Follow up in 6 months   Thank you for choosing [facility]  Kavitha Nandigam,MD

## 2017-10-17 NOTE — Telephone Encounter (Signed)
[facility] Pre-operative Risk Assessment     Request for surgical clearance:     Endoscopy Procedure  What type of surgery is being performed?     Colonoscopy  When is this surgery scheduled?    10/25  What type of clearance is required ?   Pharmacy  Are there any medications that need to be held prior to surgery and how long? 5 days?  Practice name and name of physician performing surgery?      [facility]  What is your office phone and fax number?      Phone- [phone]  [phone]  Anesthesia type (None, local, MAC, general) ?       MAC

## 2017-10-18 ENCOUNTER — Encounter: Payer: Self-pay | Admitting: Gastroenterology

## 2017-10-18 NOTE — Telephone Encounter (Signed)
  See message from 10/15/17

## 2017-10-18 NOTE — Telephone Encounter (Signed)
   Primary Cardiologist: Quay Burow, MD  Chart reviewed as part of pre-operative protocol coverage.   Dr.Berry, Please provide pharmacy clearance of hold ASA+/- Plavix. You have seen the patient last week. Please forward your response to [facility] PREOP.   Thank you  Leanor Kail, PA 10/18/2017, 4:26 PM

## 2017-10-19 NOTE — Telephone Encounter (Signed)
Spoke to patient and informed her to hold Plavix 5 days prior to her colonoscopy. She verbalized understanding that she will take her last dose tomorrow 10/19 and then hold it until her procedure and resume per discharge instructions.

## 2017-10-19 NOTE — Telephone Encounter (Signed)
Okay to interrupt antiplatelet therapy for colonoscopy 

## 2017-10-26 ENCOUNTER — Encounter: Payer: Self-pay | Admitting: Gastroenterology

## 2017-10-26 ENCOUNTER — Ambulatory Visit (AMBULATORY_SURGERY_CENTER): Payer: Medicare Other | Admitting: Gastroenterology

## 2017-10-26 VITALS — BP 148/75 | HR 66 | Temp 97.5°F | Resp 16 | Ht 66.0 in | Wt 173.0 lb

## 2017-10-26 DIAGNOSIS — Z86010 Personal history of colonic polyps: Secondary | ICD-10-CM | POA: Diagnosis not present

## 2017-10-26 DIAGNOSIS — I10 Essential (primary) hypertension: Secondary | ICD-10-CM | POA: Diagnosis not present

## 2017-10-26 DIAGNOSIS — E109 Type 1 diabetes mellitus without complications: Secondary | ICD-10-CM | POA: Diagnosis not present

## 2017-10-26 DIAGNOSIS — D122 Benign neoplasm of ascending colon: Secondary | ICD-10-CM | POA: Diagnosis not present

## 2017-10-26 DIAGNOSIS — D123 Benign neoplasm of transverse colon: Secondary | ICD-10-CM | POA: Diagnosis not present

## 2017-10-26 DIAGNOSIS — D125 Benign neoplasm of sigmoid colon: Secondary | ICD-10-CM

## 2017-10-26 DIAGNOSIS — I2510 Atherosclerotic heart disease of native coronary artery without angina pectoris: Secondary | ICD-10-CM | POA: Diagnosis not present

## 2017-10-26 DIAGNOSIS — D124 Benign neoplasm of descending colon: Secondary | ICD-10-CM | POA: Diagnosis not present

## 2017-10-26 DIAGNOSIS — K635 Polyp of colon: Secondary | ICD-10-CM | POA: Diagnosis not present

## 2017-10-26 MED ORDER — SODIUM CHLORIDE 0.9 % IV SOLN: 500.0000 mL | Freq: Once | INTRAVENOUS | Status: DC

## 2017-10-26 NOTE — Op Note (Addendum)
[facility] Patient Name: Nena Pedigo Procedure Date: 10/26/2017 7:57 AM MRN: [id number] Endoscopist: Mauri Pole , MD Age: 77 Referring MD:  Date of Birth: 01/21/1940 Gender: Female Account #: [id number] Procedure:                Colonoscopy Indications:              Surveillance: Personal history of adenomatous                            polyps on last colonoscopy 3 years ago, High risk                            colon cancer surveillance: Personal history of                            multiple (3 or more) adenomas Medicines:                Monitored Anesthesia Care Procedure:                Pre-Anesthesia Assessment:                           - Prior to the procedure, a History and Physical                            was performed, and patient medications and                            allergies were reviewed. The patient's tolerance of                            previous anesthesia was also reviewed. The risks                            and benefits of the procedure and the sedation                            options and risks were discussed with the patient.                            All questions were answered, and informed consent                            was obtained. Prior Anticoagulants: The patient                            last took Plavix (clopidogrel) 5 days prior to the                            procedure. ASA Grade Assessment: III - A patient                            with severe systemic disease. After reviewing the  risks and benefits, the patient was deemed in                            satisfactory condition to undergo the procedure.                           After obtaining informed consent, the colonoscope                            was passed under direct vision. Throughout the                            procedure, the patient's blood pressure, pulse, and                            oxygen saturations were  monitored continuously. The                            Model PCF-H190DL [id number]) scope was introduced                            through the anus and advanced to the the terminal                            ileum, with identification of the appendiceal                            orifice and IC valve. The colonoscopy was performed                            without difficulty. The patient tolerated the                            procedure well. The quality of the bowel                            preparation was excellent. The terminal ileum,                            ileocecal valve, appendiceal orifice, and rectum                            were photographed. Scope In: 8:08:56 AM Scope Out: 8:27:43 AM Scope Withdrawal Time: 0 hours 14 minutes 52 seconds  Total Procedure Duration: 0 hours 18 minutes 47 seconds  Findings:                 The perianal and digital rectal examinations were                            normal.                           Four sessile polyps were found in the sigmoid  colon, descending colon, transverse colon and                            ascending colon. The polyps were 5 to 12 mm in                            size. These polyps were removed with a cold snare.                            Resection and retrieval were complete.                           Non-bleeding internal hemorrhoids were found during                            retroflexion. The hemorrhoids were small. Complications:            No immediate complications. Estimated Blood Loss:     Estimated blood loss was minimal. Impression:               - Four 5 to 12 mm polyps in the sigmoid colon, in                            the descending colon, in the transverse colon and                            in the ascending colon, removed with a cold snare.                            Resected and retrieved.                           - Non-bleeding internal  hemorrhoids. Recommendation:           - Patient has a contact number available for                            emergencies. The signs and symptoms of potential                            delayed complications were discussed with the                            patient. Return to normal activities tomorrow.                            Written discharge instructions were provided to the                            patient.                           - Resume previous diet.                           - Continue  present medications.                           - Await pathology results.                           - Repeat colonoscopy date to be determined after                            pending pathology results are reviewed for                            surveillance based on pathology results.                           - Resume Plavix (clopidogrel) at prior dose in 3                            days. Refer to managing physician for further                            adjustment of therapy. Mauri Pole, MD 10/26/2017 8:33:36 AM This report has been signed electronically.

## 2017-10-26 NOTE — Progress Notes (Signed)
To recovery, report to RN, VSS. 

## 2017-10-26 NOTE — Patient Instructions (Signed)
YOU HAD AN ENDOSCOPIC PROCEDURE TODAY AT [facility]:   Refer to the procedure report that was given to you for any specific questions about what was found during the examination.  If the procedure report does not answer your questions, please call your gastroenterologist to clarify.  If you requested that your care partner not be given the details of your procedure findings, then the procedure report has been included in a sealed envelope for you to review at your convenience later.  YOU SHOULD EXPECT: Some feelings of bloating in the abdomen. Passage of more gas than usual.  Walking can help get rid of the air that was put into your GI tract during the procedure and reduce the bloating. If you had a lower endoscopy (such as a colonoscopy or flexible sigmoidoscopy) you may notice spotting of blood in your stool or on the toilet paper. If you underwent a bowel prep for your procedure, you may not have a normal bowel movement for a few days.  Please Note:  You might notice some irritation and congestion in your nose or some drainage.  This is from the oxygen used during your procedure.  There is no need for concern and it should clear up in a day or so.  SYMPTOMS TO REPORT IMMEDIATELY:   Following lower endoscopy (colonoscopy or flexible sigmoidoscopy):  Excessive amounts of blood in the stool  Significant tenderness or worsening of abdominal pains  Swelling of the abdomen that is new, acute  Fever of 100F or higher  For urgent or emergent issues, a gastroenterologist can be reached at any hour by calling [phone].   DIET:  We do recommend a small meal at first, but then you may proceed to your regular diet.  Drink plenty of fluids but you should avoid alcoholic beverages for 24 hours.  ACTIVITY:  You should plan to take it easy for the rest of today and you should NOT DRIVE or use heavy machinery until tomorrow (because of the sedation medicines used during the test).     FOLLOW UP: Our staff will call the number listed on your records the next business day following your procedure to check on you and address any questions or concerns that you may have regarding the information given to you following your procedure. If we do not reach you, we will leave a message.  However, if you are feeling well and you are not experiencing any problems, there is no need to return our call.  We will assume that you have returned to your regular daily activities without incident.  If any biopsies were taken you will be contacted by phone or by letter within the next 1-3 weeks.  Please call us at [phone] if you have not heard about the biopsies in 3 weeks.  Await for biopsy results  Polyps (handout given) Hemorrhoids(handout given) Restart Plavix on Monday October 28,2019.  SIGNATURES/CONFIDENTIALITY: You and/or your care partner have signed paperwork which will be entered into your electronic medical record.  These signatures attest to the fact that that the information above on your After Visit Summary has been reviewed and is understood.  Full responsibility of the confidentiality of this discharge information lies with you and/or your care-partner.

## 2017-10-26 NOTE — Progress Notes (Signed)
Called to room to assist during endoscopic procedure.  Patient ID and intended procedure confirmed with present staff. Received instructions for my participation in the procedure from the performing physician.  

## 2017-10-29 ENCOUNTER — Telehealth: Payer: Self-pay

## 2017-10-29 NOTE — Telephone Encounter (Signed)
  Follow up Call-  Call back number 10/26/2017  Post procedure Call Back phone  # [phone] hm  Permission to leave phone message Yes  Some recent data might be hidden     Patient questions:  Do you have a fever, pain , or abdominal swelling? No. Pain Score  0 *  Have you tolerated food without any problems? Yes.    Have you been able to return to your normal activities? Yes.    Do you have any questions about your discharge instructions: Diet   No. Medications  No. Follow up visit  No.  Do you have questions or concerns about your Care? No.  Actions: * If pain score is 4 or above: No action needed, pain <4.

## 2017-11-05 ENCOUNTER — Other Ambulatory Visit: Payer: Self-pay | Admitting: Endocrinology

## 2017-11-08 ENCOUNTER — Encounter: Payer: Self-pay | Admitting: Gastroenterology

## 2017-11-08 ENCOUNTER — Other Ambulatory Visit: Payer: Self-pay | Admitting: Endocrinology

## 2017-12-06 ENCOUNTER — Other Ambulatory Visit: Payer: Self-pay

## 2017-12-06 MED ORDER — LIRAGLUTIDE 18 MG/3ML ~~LOC~~ SOPN: PEN_INJECTOR | SUBCUTANEOUS | 2 refills | Status: DC

## 2017-12-19 DIAGNOSIS — E113493 Type 2 diabetes mellitus with severe nonproliferative diabetic retinopathy without macular edema, bilateral: Secondary | ICD-10-CM | POA: Diagnosis not present

## 2017-12-19 DIAGNOSIS — H43813 Vitreous degeneration, bilateral: Secondary | ICD-10-CM | POA: Diagnosis not present

## 2017-12-19 DIAGNOSIS — H35432 Paving stone degeneration of retina, left eye: Secondary | ICD-10-CM | POA: Diagnosis not present

## 2017-12-19 DIAGNOSIS — Q141 Congenital malformation of retina: Secondary | ICD-10-CM | POA: Diagnosis not present

## 2018-01-07 ENCOUNTER — Other Ambulatory Visit (INDEPENDENT_AMBULATORY_CARE_PROVIDER_SITE_OTHER): Payer: Medicare Other

## 2018-01-07 DIAGNOSIS — E1165 Type 2 diabetes mellitus with hyperglycemia: Secondary | ICD-10-CM

## 2018-01-07 DIAGNOSIS — Z794 Long term (current) use of insulin: Secondary | ICD-10-CM

## 2018-01-07 LAB — COMPREHENSIVE METABOLIC PANEL
ALBUMIN: 3.9 g/dL (ref 3.5–5.2)
ALT: 20 U/L (ref 0–35)
AST: 20 U/L (ref 0–37)
Alkaline Phosphatase: 90 U/L (ref 39–117)
BUN: 16 mg/dL (ref 6–23)
CALCIUM: 9.4 mg/dL (ref 8.4–10.5)
CO2: 28 mEq/L (ref 19–32)
Chloride: 107 mEq/L (ref 96–112)
Creatinine, Ser: 1.17 mg/dL (ref 0.40–1.20)
GFR: 47.51 mL/min — AB (ref >60.00)
Glucose, Bld: 144 mg/dL — ABNORMAL HIGH (ref 70–99)
POTASSIUM: 4.2 meq/L (ref 3.5–5.1)
Sodium: 142 mEq/L (ref 135–145)
Total Bilirubin: 0.4 mg/dL (ref 0.2–1.2)
Total Protein: 6.4 g/dL (ref 6.0–8.3)

## 2018-01-07 LAB — HEMOGLOBIN A1C: HEMOGLOBIN A1C: 7.1 % — AB (ref 4.6–6.5)

## 2018-01-11 ENCOUNTER — Encounter: Payer: Self-pay | Admitting: Endocrinology

## 2018-01-11 ENCOUNTER — Ambulatory Visit (INDEPENDENT_AMBULATORY_CARE_PROVIDER_SITE_OTHER): Payer: Medicare Other | Admitting: Endocrinology

## 2018-01-11 VITALS — BP 132/82 | HR 78 | Wt 177.2 lb

## 2018-01-11 DIAGNOSIS — Z794 Long term (current) use of insulin: Secondary | ICD-10-CM

## 2018-01-11 DIAGNOSIS — E1165 Type 2 diabetes mellitus with hyperglycemia: Secondary | ICD-10-CM

## 2018-01-11 NOTE — Progress Notes (Signed)
Patient ID: [name], female   DOB: June 14, 1940, 78 y.o.   MRN: [id number]            Reason for Appointment: Followup for Type 2 Diabetes   History of Present Illness:          Diagnosis: Type 2 diabetes mellitus, date of diagnosis: 1998       Past history:   She was treated with metformin at diagnosis when this had been continued until 2015 Subsequently Amaryl was also added several years ago and this has been continued Her blood sugars were under fair control between 2010 and early 2013 with A1c ranging from 7.4-9.4, mostly under 8% However since 08/2011 her A1c has been mostly over 8%  Insulin was added in 2014 with small doses of Lantus and this has been progressively increased She was started on mealtime insulin on her initial consultation in 9/15 because of high postprandial readings, sometimes over 300 With adding Victoza in 02/2014 her blood sugars were somewhat better with A1c coming down below 8%  Recent history:   INSULIN regimen: Regular 16 units before meals twice daily.  NPH 38 units in the morning and 20 hs  Non-insulin hypoglycemic drugs the patient is taking are: Victoza 1.2 mg daily  A1c has been consistently under 7, now higher at 7.1 6   Current blood sugar patterns, daily management and problems identified:  Fasting blood sugars: Blood sugars have improved somewhat but are still mostly high and averaging about 170 with some variability Metformin has been stopped because of renal dysfunction Her blood sugars are being checked at different times of the day in the morning, sometimes not until 1 PM She appears to have gained some weight   Postprandial readings:  She is checking her blood sugars between about 9-11 PM but not after breakfast despite reminders Blood sugars are variable but mostly high Has only a couple of readings in the afternoon which are variable  Insulin regimen:  She is on a consistent dose regimen of 38 units of NPH in the  morning and 20 at bedtime  She takes the same amount of REGULAR insulin for breakfast and dinner despite variable intake  Cannot afford brand-name insulin  She does try to take insulin 30 minutes before eating  VICTOZA: She has continued to take 1.2 mg daily without any side effects      Dinner 6-7 pm  Glucose monitoring:  done 1-2 times a day         Glucometer: One Touch.       Blood Glucose readings by time of day from download:   PRE-MEAL Fasting Lunch Dinner Bedtime Overall  Glucose range:  135-225   150, 262  104-266   Mean/median:  173    167 169   POST-MEAL PC Breakfast PC Lunch PC Dinner  Glucose range: ?    Still not  Mean/median:      PREVIOUS readings:  PRE-MEAL Fasting Lunch Dinner Bedtime Overall  Glucose range:  124-208     122-318  Mean/median: 155    168   POST-MEAL PC Breakfast PC Lunch PC Dinner  Glucose range:   122-318  Mean/median:   180    Self-care: The diet that the patient has been following is: none, usually eating low fat meals Meals: 2-3 meals per day          Dietician visit, most recent: never.  She saw the CDE in 02/2014  Exercise: none   Weight history:  Wt Readings from Last 3 Encounters:  01/11/18 177 lb 3.2 oz (80.4 kg)  10/26/17 173 lb (78.5 kg)  10/15/17 173 lb (78.5 kg)    Glycemic control:   Lab Results  Component Value Date   HGBA1C 7.1 (H) 01/07/2018   HGBA1C 6.9 (H) 10/05/2017   HGBA1C 6.4 05/24/2017   Lab Results  Component Value Date   MICROALBUR 57.4 (H) 05/24/2017   LDLCALC 41 04/04/2017   CREATININE 1.17 01/07/2018     OTHER active problems  discussed in review of systems   Allergies as of 01/11/2018      Reactions   Codeine    REACTION: makes her nervous, orTylenol #3   Ibuprofen    REACTION: nervous   Meperidine Hcl    REACTION: nasuea and vomitting   Naproxen Sodium    REACTION: nervous      Medication List       Accurate as of January 11, 2018 11:59 PM. Always use your  most recent med list.        amLODipine 5 MG tablet Commonly known as:  NORVASC TAKE 1 TABLET(5 MG) BY MOUTH DAILY   aspirin 81 MG EC tablet Take 1 tablet (81 mg total) by mouth daily.   atenolol 100 MG tablet Commonly known as:  TENORMIN Take 1 tablet (100 mg total) by mouth daily.   atorvastatin 20 MG tablet Commonly known as:  LIPITOR TAKE 1 TABLET(20 MG) BY MOUTH DAILY   azelastine 0.1 % nasal spray Commonly known as:  ASTELIN Place 2 sprays into both nostrils at bedtime as needed for rhinitis. Use in each nostril as directed   cholecalciferol 1000 units tablet Commonly known as:  VITAMIN D Take 1,000 Units by mouth daily.   clopidogrel 75 MG tablet Commonly known as:  PLAVIX Take 1 tablet (75 mg total) by mouth daily.   clorazepate 7.5 MG tablet Commonly known as:  TRANXENE Take 1 tablet (7.5 mg total) by mouth as needed.   docusate sodium 100 MG capsule Commonly known as:  COLACE Take 200 mg by mouth daily as needed for mild constipation.   fluticasone 50 MCG/ACT nasal spray Commonly known as:  FLONASE SHAKE LIQUID AND USE 2 SPRAYS IN EACH NOSTRIL DAILY   HYDROcodone-acetaminophen 5-325 MG tablet Commonly known as:  NORCO/VICODIN Take 1 tablet by mouth 3 (three) times daily as needed for moderate pain.   insulin NPH Human 100 UNIT/ML injection Commonly known as:  HUMULIN N,NOVOLIN N 20-38 Units 2 (two) times daily before a meal. Inject 38 units into the skin every morning and inject 20 units into the skin at bedtime.   insulin regular 100 units/mL injection Commonly known as:  NOVOLIN R,HUMULIN R Inject 16 Units into the skin 2 (two) times daily before a meal.   INSULIN SYRINGE .5CC/31GX5/16" 31G X 5/16" 0.5 ML Misc USE TO INJECT INSULIN 5 TIMES PER DAY   irbesartan 300 MG tablet Commonly known as:  AVAPRO Take 1 tablet (300 mg total) by mouth daily.   isosorbide mononitrate 30 MG 24 hr tablet Commonly known as:  IMDUR TAKE 1 TABLET(30 MG) BY  MOUTH DAILY   liraglutide 18 MG/3ML Sopn Commonly known as:  VICTOZA ADMINISTER 1.2 MG UNDER THE SKIN EVERY DAY   mometasone 0.1 % cream Commonly known as:  ELOCON Use as directed   multivitamin capsule Take 1 capsule by mouth daily.   neomycin-polymyxin-hydrocortisone OTIC solution Commonly known as:  CORTISPORIN 2-3  drops in each ear three times daily   omeprazole 20 MG capsule Commonly known as:  PRILOSEC Take 1 capsule (20 mg total) by mouth daily.   ONE TOUCH ULTRA SYSTEM KIT w/Device Kit 1 kit by Does not apply route once.   ONE TOUCH ULTRA TEST test strip Generic drug:  glucose blood TEST BLOOD SUGAR TWICE DAILY AS DIRECTED       Allergies:  Allergies  Allergen Reactions  . Codeine     REACTION: makes her nervous, orTylenol #3  . Ibuprofen     REACTION: nervous  . Meperidine Hcl     REACTION: nasuea and vomitting  . Naproxen Sodium     REACTION: nervous    Past Medical History:  Diagnosis Date  . Acute blood loss anemia 09/17/2015  . Allergy   . Anemia   . Anginal pain ([place]) 2017  . Anxiety   . Arthritis    "hands and knees mostly" (09/16/2015)  . B12 deficiency anemia   . CAD (coronary artery disease)    Dr Gwenlyn Found  . Cataract    bil cataracts removed  . 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 Clotting disorder ([place])   . Depression   . DJD (degenerative joint disease)   . 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 Gastritis   . GERD (gastroesophageal reflux disease)   . Headache(784.0)   . History of blood transfusion    "I"ve had 20 some; thru birth of children, loss of blood, last 2 were in ~ 1999 before my cancer surgery" (09/16/2015)  . History of hiatal hernia   . History of shingles   . Hx of colonic polyps   . Hyperlipidemia   . Hypertension   . 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 Myocardial infarction ([place])    1997  . Pneumonia    X 1  . Postoperative hematoma involving circulatory system  following cardiac catheterization 09/17/2015  . S/P cardiac cath 09/16/15 09/17/2015  . Type II diabetes mellitus ([place])   . 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 Venous insufficiency     Past Surgical History:  Procedure Laterality Date  . CARDIAC CATHETERIZATION  1997; 2008; 09/16/2015  . CARDIAC CATHETERIZATION N/A 09/16/2015   Procedure: Right/Left Heart Cath and Coronary/Graft Angiography;  Surgeon: Lorretta Harp, MD;  Location: [facility];  Service: Cardiovascular;  Laterality: N/A;  . CESAREAN SECTION  1966  . COLONOSCOPY    . CORONARY ARTERY BYPASS GRAFT  1997   CABG X5  . EYE SURGERY     2 laser surgeries on left eye with implant  . EYE SURGERY Bilateral    cataracts  . FRACTURE SURGERY    . HERNIA REPAIR    . LAPAROSCOPIC ASSISTED VENTRAL HERNIA REPAIR  09/2008    with incarcerated colon;  Dr. Lucia Gaskins  . MOHS SURGERY  06/2016   BCC  . PATELLA FRACTURE SURGERY Left 11/1994   "crushed my knee"; put in a plate & 6 screws"  . POLYPECTOMY    . REFRACTIVE SURGERY Left 07/30/2015  . 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 March Rummage  . SHOULDER ARTHROSCOPY W/ ROTATOR CUFF REPAIR Right 1997  . SMALL INTESTINE SURGERY    . TONSILLECTOMY  1960s  . TOTAL KNEE ARTHROPLASTY WITH HARDWARE REMOVAL Left 12/1994    (infex, hardware removed )  . [place]    Family  History  Problem Relation Age of Onset  . Heart disease Mother 92  . Heart disease Father 36       MI  . Hypertension Child   . Heart attack Child   . Diabetes Child   . Heart disease Maternal Aunt        x 2, all deceased  . Heart disease Maternal Uncle        x 4, all deceased  . Emphysema Brother 13  . Colon cancer Neg Hx   . Breast cancer Neg Hx   . Rectal cancer Neg Hx   . Stomach cancer Neg Hx     Social History:  reports that she has never smoked. She has never used smokeless tobacco. She reports that she does not drink alcohol or use drugs.    Review  of Systems         Lipids: Has had excellent control of hypercholesterolemia with long-term use of Lipitor She has a history of coronary bypass surgery.   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 creatinine has been higher in 2019 but now better without any specific intervention Also has mild microalbuminuria   Lab Results  Component Value Date   CREATININE 1.17 01/07/2018   CREATININE 1.62 (H) 10/05/2017   CREATININE 1.48 (H) 09/06/2017       No history of Numbness, tingling or burning in feet   Diabetic foot exam was in 5/19 showing mild neuropathy  History of CLL: She has not followed up with hematologist for some time  Lab Results  Component Value Date   WBC 37.5 (H) 09/06/2017       Physical Examination:  BP 132/82 (BP Location: Left Arm, Patient Position: Sitting, Cuff Size: Normal)   Pulse 78   Wt 177 lb 3.2 oz (80.4 kg)   SpO2 96%   BMI 28.60 kg/m   No pedal edema  Diabetes type 2, insulin requiring with BMI 29   See history of present illness for detailed discussion of her current blood sugar patterns, management and problems identified  Her A1c is slightly higher at 7.1 although still reasonably controlled for her age and comorbid conditions  Fasting readings are mostly high and averaging 170 However because of her variable diet and NPH insulin her morning sugars are inconsistent but appear to be  relatively higher if she checks them earlier in the morning Postprandial readings are only being checked sporadically and may be as high as 266 Usually watching her diet but has gained weight  RENAL insufficiency: Her creatinine is back to normal  HYPERTENSION: Blood pressure is controlled   PLAN:   Restart metformin, she has a prescription at home.  Since her renal function is normal she can take 500  mg in the morning and 1000 in the evening  She will call if her sugars are not improved with starting this  No change in insulin at this time  Reminded her to check more readings after meals  To be as active as possible   Patient Instructions  Metformin 1 in am and 2 at dinner      Elayne Snare 01/12/2018, 2:26 PM   Note: This office note was prepared with Dragon voice recognition system technology. Any transcriptional errors that result from this process are unintentional.

## 2018-01-11 NOTE — Patient Instructions (Signed)
Metformin 1 in am and 2 at dinner

## 2018-01-24 ENCOUNTER — Other Ambulatory Visit: Payer: Self-pay | Admitting: Cardiovascular Disease

## 2018-02-04 ENCOUNTER — Other Ambulatory Visit: Payer: Self-pay | Admitting: Endocrinology

## 2018-02-19 ENCOUNTER — Telehealth: Payer: Self-pay

## 2018-02-19 ENCOUNTER — Encounter: Payer: Self-pay | Admitting: Internal Medicine

## 2018-02-19 ENCOUNTER — Ambulatory Visit (INDEPENDENT_AMBULATORY_CARE_PROVIDER_SITE_OTHER): Payer: Medicare Other | Admitting: Internal Medicine

## 2018-02-19 VITALS — BP 126/72 | HR 74 | Temp 97.6°F | Resp 16 | Ht 66.0 in | Wt 177.0 lb

## 2018-02-19 DIAGNOSIS — F419 Anxiety disorder, unspecified: Secondary | ICD-10-CM

## 2018-02-19 DIAGNOSIS — M159 Polyosteoarthritis, unspecified: Secondary | ICD-10-CM

## 2018-02-19 DIAGNOSIS — K625 Hemorrhage of anus and rectum: Secondary | ICD-10-CM

## 2018-02-19 DIAGNOSIS — F329 Major depressive disorder, single episode, unspecified: Secondary | ICD-10-CM

## 2018-02-19 DIAGNOSIS — M150 Primary generalized (osteo)arthritis: Secondary | ICD-10-CM | POA: Diagnosis not present

## 2018-02-19 DIAGNOSIS — Z79899 Other long term (current) drug therapy: Secondary | ICD-10-CM

## 2018-02-19 LAB — CBC WITH DIFFERENTIAL/PLATELET
Basophils Absolute: 0.1 10*3/uL (ref 0.0–0.1)
Basophils Relative: 0.3 % (ref 0.0–3.0)
Eosinophils Absolute: 0.3 10*3/uL (ref 0.0–0.7)
Eosinophils Relative: 0.7 % (ref 0.0–5.0)
HCT: 37.6 % (ref 36.0–46.0)
HEMOGLOBIN: 12.2 g/dL (ref 12.0–15.0)
Lymphocytes Relative: 84.9 % — ABNORMAL HIGH (ref 12.0–46.0)
Lymphs Abs: 35 10*3/uL — ABNORMAL HIGH (ref 0.7–4.0)
MCHC: 32.3 g/dL (ref 30.0–36.0)
MCV: 93 fl (ref 78.0–100.0)
MONO ABS: 0.7 10*3/uL (ref 0.1–1.0)
Monocytes Relative: 1.7 % — ABNORMAL LOW (ref 3.0–12.0)
Neutro Abs: 5.1 10*3/uL (ref 1.4–7.7)
Neutrophils Relative %: 12.4 % — ABNORMAL LOW (ref 43.0–77.0)
Platelets: 221 10*3/uL (ref 150.0–400.0)
RBC: 4.05 Mil/uL (ref 3.87–5.11)
RDW: 14.1 % (ref 11.5–15.5)

## 2018-02-19 MED ORDER — HYDROCODONE-ACETAMINOPHEN 5-325 MG PO TABS: 1.0000 | ORAL_TABLET | Freq: Three times a day (TID) | ORAL | 0 refills | Status: DC | PRN

## 2018-02-19 NOTE — Progress Notes (Signed)
Subjective:    Patient ID: [place], female    DOB: 07-Jan-1940, 78 y.o.   MRN: [id number]  DOS:  02/19/2018 Type of visit - description: Routine visit Note from Endo, GI and cardiology reviewed. Approximately December 17 and January 2 she had red blood per rectum, the last bleeding was lighter, approximately 2 tablespoons of blood, not associated with a bowel movement. Denies any abdominal or rectal pain  Review of Systems Currently doing well. No nausea or vomiting. No further episodes of red blood per rectum.  Past Medical History:  Diagnosis Date  . Acute blood loss anemia 09/17/2015  . Allergy   . Anemia   . Anginal pain ([place]) 2017  . Anxiety   . Arthritis    "hands and knees mostly" (09/16/2015)  . B12 deficiency anemia   . CAD (coronary artery disease)    Dr Gwenlyn Found  . Cataract    bil cataracts removed  . CLL (chronic lymphocytic leukemia) ([place]) 02/25/2014  . Clotting disorder ([place])   . Depression   . DJD (degenerative joint disease)   . 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 Gastritis   . GERD (gastroesophageal reflux disease)   . Headache(784.0)   . History of blood transfusion    "I"ve had 20 some; thru birth of children, loss of blood, last 2 were in ~ 1999 before my cancer surgery" (09/16/2015)  . History of hiatal hernia   . History of shingles   . Hx of colonic polyps   . Hyperlipidemia   . Hypertension   . 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 Myocardial infarction ([place])    1997  . Pneumonia    X 1  . Postoperative hematoma involving circulatory system following cardiac catheterization 09/17/2015  . S/P cardiac cath 09/16/15 09/17/2015  . Type II diabetes mellitus ([place])   . 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 Venous insufficiency     Past Surgical History:  Procedure Laterality Date  . CARDIAC CATHETERIZATION  1997; 2008; 09/16/2015  . CARDIAC CATHETERIZATION N/A 09/16/2015   Procedure:  Right/Left Heart Cath and Coronary/Graft Angiography;  Surgeon: Lorretta Harp, MD;  Location: [facility];  Service: Cardiovascular;  Laterality: N/A;  . CESAREAN SECTION  1966  . COLONOSCOPY    . CORONARY ARTERY BYPASS GRAFT  1997   CABG X5  . EYE SURGERY     2 laser surgeries on left eye with implant  . EYE SURGERY Bilateral    cataracts  . FRACTURE SURGERY    . HERNIA REPAIR    . LAPAROSCOPIC ASSISTED VENTRAL HERNIA REPAIR  09/2008    with incarcerated colon;  Dr. Lucia Gaskins  . MOHS SURGERY  06/2016   BCC  . PATELLA FRACTURE SURGERY Left 11/1994   "crushed my knee"; put in a plate & 6 screws"  . POLYPECTOMY    . REFRACTIVE SURGERY Left 07/30/2015  . 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 March Rummage  . SHOULDER ARTHROSCOPY W/ ROTATOR CUFF REPAIR Right 1997  . SMALL INTESTINE SURGERY    . TONSILLECTOMY  1960s  . TOTAL KNEE ARTHROPLASTY WITH HARDWARE REMOVAL Left 12/1994    (infex, hardware removed )  . TUBAL LIGATION  1966    Social History   Socioeconomic History  . Marital status: Widowed    Spouse name: Not on file  . Number of children: 2  . Years of education:  Not on file  . Highest education level: Not on file  Occupational History  . Occupation: retired-- Research officer, trade union: RETIRED  Social Needs  . Financial resource strain: Not on file  . Food insecurity:    Worry: Not on file    Inability: Not on file  . Transportation needs:    Medical: Not on file    Non-medical: Not on file  Tobacco Use  . Smoking status: Never Smoker  . Smokeless tobacco: Never Used  Substance and Sexual Activity  . Alcohol use: No    Alcohol/week: 0.0 standard drinks  . Drug use: No  . Sexual activity: Not Currently  Lifestyle  . Physical activity:    Days per week: Not on file    Minutes per session: Not on file  . Stress: Not on file  Relationships  . Social connections:    Talks on phone: Not on file    Gets together: Not on file    Attends religious  service: Not on file    Active member of club or organization: Not on file    Attends meetings of clubs or organizations: Not on file    Relationship status: Not on file  . Intimate partner violence:    Fear of current or ex partner: Not on file    Emotionally abused: Not on file    Physically abused: Not on file    Forced sexual activity: Not on file  Other Topics Concern  . Not on file  Social History Narrative   Lives by herself, lost husband ~ 2004    1 child in [place]   I child in [place] as of 02/19/2018      Reactions   Codeine    REACTION: makes her nervous, orTylenol #3   Ibuprofen    REACTION: nervous   Meperidine Hcl    REACTION: nasuea and vomitting   Naproxen Sodium    REACTION: nervous      Medication List       Accurate as of February 19, 2018  5:52 PM. Always use your most recent med list.        amLODipine 5 MG tablet Commonly known as:  NORVASC TAKE 1 TABLET(5 MG) BY MOUTH DAILY   aspirin 81 MG EC tablet Take 1 tablet (81 mg total) by mouth daily.   atenolol 100 MG tablet Commonly known as:  TENORMIN Take 1 tablet (100 mg total) by mouth daily.   atorvastatin 20 MG tablet Commonly known as:  LIPITOR TAKE 1 TABLET(20 MG) BY MOUTH DAILY   azelastine 0.1 % nasal spray Commonly known as:  ASTELIN Place 2 sprays into both nostrils at bedtime as needed for rhinitis. Use in each nostril as directed   cholecalciferol 1000 units tablet Commonly known as:  VITAMIN D Take 1,000 Units by mouth daily.   clopidogrel 75 MG tablet Commonly known as:  PLAVIX Take 1 tablet (75 mg total) by mouth daily.   clorazepate 7.5 MG tablet Commonly known as:  TRANXENE Take 1 tablet (7.5 mg total) by mouth as needed.   docusate sodium 100 MG capsule Commonly known as:  COLACE Take 200 mg by mouth daily as needed for mild constipation.   fluticasone 50 MCG/ACT nasal spray Commonly known as:  FLONASE SHAKE LIQUID AND USE 2 SPRAYS IN EACH NOSTRIL  DAILY   HYDROcodone-acetaminophen 5-325 MG tablet Commonly known as:  NORCO/VICODIN Take 1 tablet by mouth 3 (  three) times daily as needed for moderate pain.   insulin NPH Human 100 UNIT/ML injection Commonly known as:  HUMULIN N,NOVOLIN N 20-38 Units 2 (two) times daily before a meal. Inject 38 units into the skin every morning and inject 20 units into the skin at bedtime.   insulin regular 100 units/mL injection Commonly known as:  NOVOLIN R,HUMULIN R Inject 16 Units into the skin 2 (two) times daily before a meal.   INSULIN SYRINGE .5CC/31GX5/16" 31G X 5/16" 0.5 ML Misc USE TO INJECT INSULIN 5 TIMES PER DAY   irbesartan 300 MG tablet Commonly known as:  AVAPRO TAKE 1 TABLET(300 MG) BY MOUTH DAILY   isosorbide mononitrate 30 MG 24 hr tablet Commonly known as:  IMDUR TAKE 1 TABLET(30 MG) BY MOUTH DAILY   liraglutide 18 MG/3ML Sopn Commonly known as:  VICTOZA ADMINISTER 1.2 MG UNDER THE SKIN EVERY DAY   metFORMIN 500 MG tablet Commonly known as:  GLUCOPHAGE Take 1 tablet by mouth every morning and 2 tablets by mouth every evening   mometasone 0.1 % cream Commonly known as:  ELOCON Use as directed   multivitamin capsule Take 1 capsule by mouth daily.   neomycin-polymyxin-hydrocortisone OTIC solution Commonly known as:  CORTISPORIN 2-3 drops in each ear three times daily   omeprazole 20 MG capsule Commonly known as:  PRILOSEC Take 1 capsule (20 mg total) by mouth daily.   ONE TOUCH ULTRA SYSTEM KIT w/Device Kit 1 kit by Does not apply route once.   ONE TOUCH ULTRA TEST test strip Generic drug:  glucose blood TEST BLOOD SUGAR TWICE DAILY AS DIRECTED           Objective:   Physical Exam BP 126/72 (BP Location: Left Arm, Patient Position: Sitting, Cuff Size: Small)   Pulse 74   Temp 97.6 F (36.4 C) (Oral)   Resp 16   Ht _0  (1.676 m)   Wt 177 lb (80.3 kg)   SpO2 96%   BMI 28.57 kg/m  General:   Well developed, NAD, BMI noted.  HEENT:    Normocephalic . Face symmetric, atraumatic Lungs:  CTA B Normal respiratory effort, no intercostal retractions, no accessory muscle use. Heart: RRR,  no murmur.  no pretibial edema bilaterally  Abdomen:  Not distended, soft, non-tender. No rebound or rigidity.   Skin: Not pale. Not jaundice Neurologic:  alert & oriented X3.  Speech normal, gait appropriate for age and unassisted Psych--  Cognition and judgment appear intact.  Cooperative with normal attention span and concentration.  Behavior appropriate. No anxious or depressed appearing.     Assessment     Assessment DM  Dr Dwyane Dee  +retinopathy  HTN Hyperlipidemia CRI creat ~1.4  (GFR ~ 38) Depression/anxiety: tranxene qhs prn (takes rarely) MSK: -DJD: hydrocodone (rx pcp) - T score -0.8 on December 2017, h/o a foot FX d/t walking years ago (likely a stress fracture), discussed treatment 04/2016:  exercise and cont Vit d Hem/Onc: Dr Marin Olp  -CLL -iron deficiency anemia: IV iron 2018 -B12 def CAD, Dr Gwenlyn Found MI 97 >> CABG, cath 12-13-2006, myoview 2012 no ischemic CP: Stress test 08/05/2015, indeterminate risk study, + lateral ischemia: Cardiac catheterization 09/16/2015: Rx medical Venous insuff GI:  Dr Silverio Decamp ---GERD, IBS, h/o Gastritis ---H/o ulcerative colitis as a child H/o shingles  BCC Dr Allyson Sabal, bx 04-2015, MOH's 06-2016   PLAN DM: Last visit with endocrinology 01/2018 HTN: Seems well controlled, currently on Tenormin, Avapro, last BMP satisfactory.  Off HCTZ. DJD: On hydrocodone,  uses it rarely, UDS and contract. RF sent  Depression anxiety: On Tranxene, takes it rarely, UDS and contract Red blood per rectum saw GI 10/2018, subsequently had a colonoscopy 10/26/2017 (polyps, internal hemorrhoids).  Had 2 episodes of red blood per rectum since, see HPI, has a follow-up with GI 03/07/2018.  Recommend to call if he has further episodes, check a CBC. CAD: Saw cardiology 10/12/2017, felt to be stable. RTC, 3 or 4,  fasting months fasting

## 2018-02-19 NOTE — Progress Notes (Signed)
Pre visit review using our clinic review tool, if applicable. No additional management support is needed unless otherwise documented below in the visit note. 

## 2018-02-19 NOTE — Telephone Encounter (Signed)
PA approved.  Request Reference Number: [id number]. HYDROCO/APAP TAB 5-325MG  is approved through 01/02/2019. For further questions, call [phone].

## 2018-02-19 NOTE — Patient Instructions (Addendum)
  GO TO THE LAB : Get the blood work     GO TO THE FRONT DESK Schedule your next appointment   a routine checkup in 3 to 4 months

## 2018-02-19 NOTE — Assessment & Plan Note (Signed)
DM: Last visit with endocrinology 01/2018 HTN: Seems well controlled, currently on Tenormin, Avapro, last BMP satisfactory.  Off HCTZ. DJD: On hydrocodone, uses it rarely, UDS and contract. RF sent  Depression anxiety: On Tranxene, takes it rarely, UDS and contract Red blood per rectum saw GI 10/2018, subsequently had a colonoscopy 10/26/2017 (polyps, internal hemorrhoids).  Had 2 episodes of red blood per rectum since, see HPI, has a follow-up with GI 03/07/2018.  Recommend to call if he has further episodes, check a CBC. CAD: Saw cardiology 10/12/2017, felt to be stable. RTC, 3 or 4, fasting months fasting

## 2018-02-19 NOTE — Telephone Encounter (Signed)
PA initiated via Covermymeds; KEY: [id number]. Awaiting determination.

## 2018-02-23 LAB — PAIN MGMT, PROFILE 8 W/CONF, U
6 ACETYLMORPHINE: NEGATIVE ng/mL (ref <10)
Alcohol Metabolites: NEGATIVE ng/mL (ref <500)
Amphetamines: NEGATIVE ng/mL (ref <500)
Benzodiazepines: NEGATIVE ng/mL (ref <100)
Buprenorphine, Urine: NEGATIVE ng/mL (ref <5)
Cocaine Metabolite: NEGATIVE ng/mL (ref <150)
Codeine: NEGATIVE ng/mL (ref <50)
Creatinine: 77.7 mg/dL
Hydrocodone: 127 ng/mL — ABNORMAL HIGH (ref <50)
Hydromorphone: 180 ng/mL — ABNORMAL HIGH (ref <50)
MDMA: NEGATIVE ng/mL (ref <500)
Marijuana Metabolite: NEGATIVE ng/mL (ref <20)
Morphine: NEGATIVE ng/mL (ref <50)
Norhydrocodone: 102 ng/mL — ABNORMAL HIGH (ref <50)
Opiates: POSITIVE ng/mL — AB (ref <100)
Oxidant: NEGATIVE ug/mL (ref <200)
Oxycodone: NEGATIVE ng/mL (ref <100)
pH: 5.62 (ref 4.5–9.0)

## 2018-03-03 ENCOUNTER — Other Ambulatory Visit: Payer: Self-pay | Admitting: Endocrinology

## 2018-03-06 ENCOUNTER — Encounter: Payer: Self-pay | Admitting: Hematology & Oncology

## 2018-03-06 ENCOUNTER — Inpatient Hospital Stay: Payer: Medicare Other

## 2018-03-06 ENCOUNTER — Other Ambulatory Visit: Payer: Self-pay

## 2018-03-06 ENCOUNTER — Inpatient Hospital Stay: Payer: Medicare Other | Attending: Hematology & Oncology | Admitting: Hematology & Oncology

## 2018-03-06 VITALS — BP 199/72 | HR 73 | Temp 97.8°F | Resp 20 | Wt 178.0 lb

## 2018-03-06 DIAGNOSIS — C9110 Chronic lymphocytic leukemia of B-cell type not having achieved remission: Secondary | ICD-10-CM

## 2018-03-06 DIAGNOSIS — E119 Type 2 diabetes mellitus without complications: Secondary | ICD-10-CM | POA: Insufficient documentation

## 2018-03-06 DIAGNOSIS — D509 Iron deficiency anemia, unspecified: Secondary | ICD-10-CM | POA: Insufficient documentation

## 2018-03-06 DIAGNOSIS — Z85038 Personal history of other malignant neoplasm of large intestine: Secondary | ICD-10-CM

## 2018-03-06 DIAGNOSIS — Z794 Long term (current) use of insulin: Secondary | ICD-10-CM | POA: Insufficient documentation

## 2018-03-06 DIAGNOSIS — Z79899 Other long term (current) drug therapy: Secondary | ICD-10-CM | POA: Insufficient documentation

## 2018-03-06 LAB — CBC WITH DIFFERENTIAL (CANCER CENTER ONLY)
Abs Immature Granulocytes: 0.07 10*3/uL (ref 0.00–0.07)
Basophils Absolute: 0.1 10*3/uL (ref 0.0–0.1)
Basophils Relative: 0 %
Eosinophils Absolute: 0.2 10*3/uL (ref 0.0–0.5)
Eosinophils Relative: 1 %
HCT: 35.9 % — ABNORMAL LOW (ref 36.0–46.0)
Hemoglobin: 11.3 g/dL — ABNORMAL LOW (ref 12.0–15.0)
Immature Granulocytes: 0 %
Lymphocytes Relative: 75 %
Lymphs Abs: 30.6 10*3/uL — ABNORMAL HIGH (ref 0.7–4.0)
MCH: 30.2 pg (ref 26.0–34.0)
MCHC: 31.5 g/dL (ref 30.0–36.0)
MCV: 96 fL (ref 80.0–100.0)
Monocytes Absolute: 5.5 10*3/uL — ABNORMAL HIGH (ref 0.1–1.0)
Monocytes Relative: 13 %
NEUTROS ABS: 4.7 10*3/uL (ref 1.7–7.7)
Neutrophils Relative %: 11 %
PLATELETS: 192 10*3/uL (ref 150–400)
RBC: 3.74 MIL/uL — ABNORMAL LOW (ref 3.87–5.11)
RDW: 13.9 % (ref 11.5–15.5)
WBC Count: 41.2 10*3/uL — ABNORMAL HIGH (ref 4.0–10.5)
nRBC: 0 % (ref 0.0–0.2)

## 2018-03-06 LAB — CMP (CANCER CENTER ONLY)
ALT: 15 U/L (ref 0–44)
AST: 17 U/L (ref 15–41)
Albumin: 4.3 g/dL (ref 3.5–5.0)
Alkaline Phosphatase: 81 U/L (ref 38–126)
Anion gap: 7 (ref 5–15)
BUN: 28 mg/dL — ABNORMAL HIGH (ref 8–23)
CHLORIDE: 111 mmol/L (ref 98–111)
CO2: 26 mmol/L (ref 22–32)
Calcium: 9.5 mg/dL (ref 8.9–10.3)
Creatinine: 1.46 mg/dL — ABNORMAL HIGH (ref 0.44–1.00)
GFR, EST AFRICAN AMERICAN: 40 mL/min — AB (ref >60)
GFR, Estimated: 34 mL/min — ABNORMAL LOW (ref >60)
Glucose, Bld: 163 mg/dL — ABNORMAL HIGH (ref 70–99)
Potassium: 4.8 mmol/L (ref 3.5–5.1)
SODIUM: 144 mmol/L (ref 135–145)
Total Bilirubin: 0.4 mg/dL (ref 0.3–1.2)
Total Protein: 6.9 g/dL (ref 6.5–8.1)

## 2018-03-06 LAB — LACTATE DEHYDROGENASE: LDH: 137 U/L (ref 98–192)

## 2018-03-06 LAB — SAVE SMEAR(SSMR), FOR PROVIDER SLIDE REVIEW

## 2018-03-06 NOTE — Progress Notes (Signed)
Hematology and Oncology Follow Up Visit  [place] [id number] 06-26-1940 78 y.o. 03/06/2018   Principle Diagnosis:  CLL -stage A Remote history of colon cancer Iron deficiency anemia  Current Therapy:   IV iron as indicated-patient received a dose in September 2018   Interim History: Ms. Meharg is here today for follow-up.  She is a little bit worried about her white cell count.  Apparently, she got a note from her family doctor telling her that her white cell count was very high.  In fact, her white cell count is holding pretty steady.  I think her biggest problem is going to be her diabetes.  She is showing some renal insufficiency.  Her blood sugar today was 163.  I think that this is going to be an issue for her.  She is a little bit more anemic today.  Again I feel this is from her diabetes and not from the CLL.  We will see her back, we will have to check an erythropoietin level on her.  She has had iron deficiency in the past.  We will have to check this.  She has had no swollen lymph nodes.  Over the holidays, a son in [place] had a stroke.  She had to go out to see him.  Thankfully, he is doing a little bit better.  He was a English as a second language teacher in [employer], academic.  He was at a [facility] in [facility].  There is been no rashes.  She has had no change in bowel or bladder habits.  She has had no cough.  Overall, her performance status is ECOG 1.    Medications:  Allergies as of 03/06/2018      Reactions   Codeine    REACTION: makes her nervous, orTylenol #3   Ibuprofen    REACTION: nervous   Meperidine Hcl    REACTION: nasuea and vomitting   Naproxen Sodium    REACTION: nervous      Medication List       Accurate as of March 06, 2018  1:21 PM. Always use your most recent med list.        amLODipine 5 MG tablet Commonly known as:  NORVASC TAKE 1 TABLET(5 MG) BY MOUTH DAILY   aspirin 81 MG EC tablet Take 1 tablet (81 mg total) by mouth daily.   atenolol 100 MG  tablet Commonly known as:  TENORMIN Take 1 tablet (100 mg total) by mouth daily.   atorvastatin 20 MG tablet Commonly known as:  LIPITOR TAKE 1 TABLET(20 MG) BY MOUTH DAILY   azelastine 0.1 % nasal spray Commonly known as:  ASTELIN Place 2 sprays into both nostrils at bedtime as needed for rhinitis. Use in each nostril as directed   cholecalciferol 1000 units tablet Commonly known as:  VITAMIN D Take 1,000 Units by mouth daily.   clopidogrel 75 MG tablet Commonly known as:  PLAVIX Take 1 tablet (75 mg total) by mouth daily.   clorazepate 7.5 MG tablet Commonly known as:  TRANXENE Take 1 tablet (7.5 mg total) by mouth as needed.   docusate sodium 100 MG capsule Commonly known as:  COLACE Take 200 mg by mouth daily as needed for mild constipation.   fluticasone 50 MCG/ACT nasal spray Commonly known as:  FLONASE SHAKE LIQUID AND USE 2 SPRAYS IN EACH NOSTRIL DAILY   HYDROcodone-acetaminophen 5-325 MG tablet Commonly known as:  NORCO/VICODIN Take 1 tablet by mouth 3 (three) times daily as needed for  moderate pain.   insulin NPH Human 100 UNIT/ML injection Commonly known as:  HUMULIN N,NOVOLIN N 20-38 Units 2 (two) times daily before a meal. Inject 38 units into the skin every morning and inject 20 units into the skin at bedtime.   insulin regular 100 units/mL injection Commonly known as:  NOVOLIN R,HUMULIN R Inject 16 Units into the skin 2 (two) times daily before a meal.   INSULIN SYRINGE .5CC/31GX5/16" 31G X 5/16" 0.5 ML Misc USE TO INJECT INSULIN 5 TIMES PER DAY   irbesartan 300 MG tablet Commonly known as:  AVAPRO TAKE 1 TABLET(300 MG) BY MOUTH DAILY   isosorbide mononitrate 30 MG 24 hr tablet Commonly known as:  IMDUR TAKE 1 TABLET(30 MG) BY MOUTH DAILY   liraglutide 18 MG/3ML Sopn Commonly known as:  VICTOZA ADMINISTER 1.2 MG UNDER THE SKIN EVERY DAY   metFORMIN 500 MG tablet Commonly known as:  GLUCOPHAGE Take 1 tablet by mouth every morning and 2  tablets by mouth every evening   mometasone 0.1 % cream Commonly known as:  ELOCON Use as directed   multivitamin capsule Take 1 capsule by mouth daily.   neomycin-polymyxin-hydrocortisone OTIC solution Commonly known as:  CORTISPORIN 2-3 drops in each ear three times daily   omeprazole 20 MG capsule Commonly known as:  PRILOSEC Take 1 capsule (20 mg total) by mouth daily.   ONE TOUCH ULTRA SYSTEM KIT w/Device Kit 1 kit by Does not apply route once.   ONE TOUCH ULTRA TEST test strip Generic drug:  glucose blood TEST BLOOD SUGAR TWICE DAILY AS DIRECTED       Allergies:  Allergies  Allergen Reactions  . Codeine     REACTION: makes her nervous, orTylenol #3  . Ibuprofen     REACTION: nervous  . Meperidine Hcl     REACTION: nasuea and vomitting  . Naproxen Sodium     REACTION: nervous    Past Medical History, Surgical history, Social history, and Family History were reviewed and updated.  Review of Systems: Review of Systems  Constitutional: Negative.   HENT: Negative.   Eyes: Negative.   Respiratory: Negative.   Cardiovascular: Negative.   Gastrointestinal: Negative.   Genitourinary: Negative.   Musculoskeletal: Negative.   Skin: Negative.   Neurological: Negative.   Endo/Heme/Allergies: Negative.   Psychiatric/Behavioral: Negative.      Physical Exam:  weight is 178 lb (80.7 kg). Her oral temperature is 97.8 F (36.6 C). Her blood pressure is 199/72 (abnormal) and her pulse is 73. Her respiration is 20 and oxygen saturation is 99%.   Wt Readings from Last 3 Encounters:  03/06/18 178 lb (80.7 kg)  02/19/18 177 lb (80.3 kg)  01/11/18 177 lb 3.2 oz (80.4 kg)    Physical Exam Vitals signs reviewed.  HENT:     Head: Normocephalic and atraumatic.  Eyes:     Pupils: Pupils are equal, round, and reactive to light.  Neck:     Musculoskeletal: Normal range of motion.  Cardiovascular:     Rate and Rhythm: Normal rate and regular rhythm.     Heart  sounds: Normal heart sounds.  Pulmonary:     Effort: Pulmonary effort is normal.     Breath sounds: Normal breath sounds.  Abdominal:     General: Bowel sounds are normal.     Palpations: Abdomen is soft.  Musculoskeletal: Normal range of motion.        General: No tenderness or deformity.  Lymphadenopathy:  Cervical: No cervical adenopathy.  Skin:    General: Skin is warm and dry.     Findings: No erythema or rash.  Neurological:     Mental Status: She is alert and oriented to person, place, and time.  Psychiatric:        Behavior: Behavior normal.        Thought Content: Thought content normal.        Judgment: Judgment normal.      Lab Results  Component Value Date   WBC 41.2 (H) 03/06/2018   HGB 11.3 (L) 03/06/2018   HCT 35.9 (L) 03/06/2018   MCV 96.0 03/06/2018   PLT 192 03/06/2018   Lab Results  Component Value Date   FERRITIN 58 03/08/2017   IRON 69 03/08/2017   TIBC 308 03/08/2017   UIBC 239 03/08/2017   IRONPCTSAT 22 03/08/2017   Lab Results  Component Value Date   RBC 3.74 (L) 03/06/2018   Lab Results  Component Value Date   KAPLAMBRATIO 1.22 11/03/2016   Lab Results  Component Value Date   IGGSERUM 844 11/03/2016   IGA 281 02/25/2014   IGMSERUM 27 11/03/2016   Lab Results  Component Value Date   TOTALPROTELP 7.2 02/25/2014   ALBUMINELP 54.3 (L) 02/25/2014   A1GS 4.4 02/25/2014   A2GS 13.7 (H) 02/25/2014   BETS 7.0 02/25/2014   BETA2SER 6.2 02/25/2014   GAMS 14.4 02/25/2014   MSPIKE Not Observed 11/03/2016   SPEI * 02/25/2014     Chemistry      Component Value Date/Time   NA 144 03/06/2018 1144   NA 147 (H) 11/03/2016 1018   NA 143 08/30/2016 0954   K 4.8 03/06/2018 1144   K 3.9 11/03/2016 1018   K 4.5 08/30/2016 0954   CL 111 03/06/2018 1144   CL 110 (H) 11/03/2016 1018   CO2 26 03/06/2018 1144   CO2 27 11/03/2016 1018   CO2 24 08/30/2016 0954   BUN 28 (H) 03/06/2018 1144   BUN 25 (H) 11/03/2016 1018   BUN 20.5 08/30/2016  0954   CREATININE 1.46 (H) 03/06/2018 1144   CREATININE 1.6 (H) 11/03/2016 1018   CREATININE 1.4 (H) 08/30/2016 0954      Component Value Date/Time   CALCIUM 9.5 03/06/2018 1144   CALCIUM 10.1 11/03/2016 1018   CALCIUM 9.8 08/30/2016 0954   ALKPHOS 81 03/06/2018 1144   ALKPHOS 112 (H) 11/03/2016 1018   ALKPHOS 113 08/30/2016 0954   AST 17 03/06/2018 1144   AST 23 08/30/2016 0954   ALT 15 03/06/2018 1144   ALT 29 11/03/2016 1018   ALT 20 08/30/2016 0954   BILITOT 0.4 03/06/2018 1144   BILITOT 0.38 08/30/2016 0954      Impression and Plan: Ms. Gerstenberger is a very pleasant 78 yo caucasian female with CLL stage A.  So far, I see no issues with the CLL.  I just do not think this is going to be a problem for [place] in the near term.  I will plan to get her back in 6 months.  I think 6 months is reasonable.  I try to reassure her that her CLL is not a problem.  I am not sure why she got a letter from her family doctor saying that she had to see [place] for her white cell problem.  Again, I understand how the primary care doctor is looking at white blood cells in our very proactive in making sure that there is no problems.  Hopefully, her son will continue to improve.  We will continue to pray for him.  He served our country.  He is a true American hero.     [name], MD 3/4/20201:21 PM

## 2018-03-07 ENCOUNTER — Ambulatory Visit: Payer: Medicare Other | Admitting: Gastroenterology

## 2018-03-13 ENCOUNTER — Other Ambulatory Visit: Payer: Self-pay | Admitting: Internal Medicine

## 2018-03-13 DIAGNOSIS — Z1231 Encounter for screening mammogram for malignant neoplasm of breast: Secondary | ICD-10-CM

## 2018-03-25 ENCOUNTER — Ambulatory Visit: Payer: Medicare Other | Admitting: Gastroenterology

## 2018-03-26 ENCOUNTER — Other Ambulatory Visit: Payer: Self-pay | Admitting: Cardiovascular Disease

## 2018-03-26 ENCOUNTER — Other Ambulatory Visit: Payer: Self-pay | Admitting: Endocrinology

## 2018-04-11 ENCOUNTER — Ambulatory Visit: Payer: Medicare Other

## 2018-04-16 ENCOUNTER — Other Ambulatory Visit: Payer: Medicare Other

## 2018-04-19 ENCOUNTER — Ambulatory Visit: Payer: Medicare Other | Admitting: Endocrinology

## 2018-05-02 ENCOUNTER — Other Ambulatory Visit: Payer: Self-pay | Admitting: Endocrinology

## 2018-05-09 ENCOUNTER — Other Ambulatory Visit: Payer: Self-pay | Admitting: Internal Medicine

## 2018-05-23 ENCOUNTER — Telehealth: Payer: Self-pay

## 2018-05-23 MED ORDER — HYDROCODONE-ACETAMINOPHEN 5-325 MG PO TABS: 1.0000 | ORAL_TABLET | Freq: Three times a day (TID) | ORAL | 0 refills | Status: DC | PRN

## 2018-05-23 NOTE — Telephone Encounter (Signed)
Hydrocodone refill.   Last OV: 02/19/2018 Last Fill: 02/19/2018 #90 and 0RF UDS: 02/19/2018 Low risk

## 2018-05-23 NOTE — Telephone Encounter (Signed)
Sent!

## 2018-05-28 ENCOUNTER — Other Ambulatory Visit: Payer: Medicare Other

## 2018-05-29 ENCOUNTER — Ambulatory Visit: Payer: Medicare Other

## 2018-05-29 ENCOUNTER — Other Ambulatory Visit: Payer: Self-pay

## 2018-05-29 ENCOUNTER — Ambulatory Visit
Admission: RE | Admit: 2018-05-29 | Discharge: 2018-05-29 | Disposition: A | Discharge status: Home / Self Care | Payer: Medicare Other | Source: Ambulatory Visit | Attending: Internal Medicine | Admitting: Internal Medicine

## 2018-05-29 ENCOUNTER — Other Ambulatory Visit (INDEPENDENT_AMBULATORY_CARE_PROVIDER_SITE_OTHER): Payer: Medicare Other

## 2018-05-29 DIAGNOSIS — Z1231 Encounter for screening mammogram for malignant neoplasm of breast: Secondary | ICD-10-CM

## 2018-05-29 DIAGNOSIS — Z794 Long term (current) use of insulin: Secondary | ICD-10-CM

## 2018-05-29 DIAGNOSIS — E1165 Type 2 diabetes mellitus with hyperglycemia: Secondary | ICD-10-CM | POA: Diagnosis not present

## 2018-05-29 LAB — BASIC METABOLIC PANEL
BUN: 22 mg/dL (ref 6–23)
CO2: 27 mEq/L (ref 19–32)
Calcium: 9.4 mg/dL (ref 8.4–10.5)
Chloride: 107 mEq/L (ref 96–112)
Creatinine, Ser: 1.34 mg/dL — ABNORMAL HIGH (ref 0.40–1.20)
GFR: 38.18 mL/min — ABNORMAL LOW (ref >60.00)
Glucose, Bld: 124 mg/dL — ABNORMAL HIGH (ref 70–99)
Potassium: 4.2 mEq/L (ref 3.5–5.1)
Sodium: 142 mEq/L (ref 135–145)

## 2018-05-29 LAB — HEMOGLOBIN A1C: Hgb A1c MFr Bld: 6.2 % (ref 4.6–6.5)

## 2018-05-30 ENCOUNTER — Encounter: Payer: Self-pay | Admitting: Internal Medicine

## 2018-05-30 ENCOUNTER — Ambulatory Visit (INDEPENDENT_AMBULATORY_CARE_PROVIDER_SITE_OTHER): Payer: Medicare Other | Admitting: Internal Medicine

## 2018-05-30 DIAGNOSIS — E785 Hyperlipidemia, unspecified: Secondary | ICD-10-CM | POA: Diagnosis not present

## 2018-05-30 DIAGNOSIS — Z794 Long term (current) use of insulin: Secondary | ICD-10-CM | POA: Diagnosis not present

## 2018-05-30 DIAGNOSIS — C9110 Chronic lymphocytic leukemia of B-cell type not having achieved remission: Secondary | ICD-10-CM | POA: Diagnosis not present

## 2018-05-30 DIAGNOSIS — I10 Essential (primary) hypertension: Secondary | ICD-10-CM

## 2018-05-30 DIAGNOSIS — E118 Type 2 diabetes mellitus with unspecified complications: Secondary | ICD-10-CM | POA: Diagnosis not present

## 2018-05-30 DIAGNOSIS — IMO0001 Reserved for inherently not codable concepts without codable children: Secondary | ICD-10-CM

## 2018-05-30 MED ORDER — NITROGLYCERIN 0.3 MG SL SUBL: 0.3000 mg | SUBLINGUAL_TABLET | SUBLINGUAL | 1 refills | Status: DC | PRN

## 2018-05-30 NOTE — Progress Notes (Signed)
Subjective:    Patient ID: [place], female    DOB: July 24, 1940, 78 y.o.   MRN: [id number]  DOS:  05/30/2018 Type of visit - description: Attempted  to make this a video visit, due to technical difficulties from the patient side it was not possible  thus we proceeded with a Virtual Visit via Telephone    I connected with@ on 05/31/18 at  9:20 AM EDT by telephone and verified that I am speaking with the correct person using two identifiers.  THIS ENCOUNTER IS A VIRTUAL VISIT DUE TO COVID-19 - PATIENT WAS NOT SEEN IN THE OFFICE. PATIENT HAS CONSENTED TO VIRTUAL VISIT / TELEMEDICINE VISIT   Location of patient: home  Location of provider: office  I discussed the limitations, risks, security and privacy concerns of performing an evaluation and management service by telephone and the availability of in person appointments. I also discussed with the patient that there may be a patient responsible charge related to this service. The patient expressed understanding and agreed to proceed.   History of Present Illness: ROV CLL : concerned about the elevated WBC H/o rectal bleeding: no further sxs HTN: wnl amb BPs, no readings Covid 19: doing well, taking good precautions. DM: Per Endo, reports good med compliance, ambulatory CBGs are good, could not tell me any readings. CAD: See review of systems   Review of Systems Denies fever chills No lower extremity edema She is very active, works on her yard, sometimes go up a small hill she has @ her property and gets tired and somewhat short of breath but no chest pain. She did report that when she rest at night sometimes have a brief, ill described, "discomfort", at the chest, lasts less than a minute. GI: No further rectal bleeding No cough.  Past Medical History:  Diagnosis Date  . Acute blood loss anemia 09/17/2015  . Allergy   . Anemia   . Anginal pain ([place]) 2017  . Anxiety   . Arthritis    "hands and knees mostly" (09/16/2015)   . B12 deficiency anemia   . CAD (coronary artery disease)    Dr Gwenlyn Found  . Cataract    bil cataracts removed  . CLL (chronic lymphocytic leukemia) ([place]) 02/25/2014  . Clotting disorder ([place])   . Depression   . DJD (degenerative joint disease)   . 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 Gastritis   . GERD (gastroesophageal reflux disease)   . Headache(784.0)   . History of blood transfusion    "I"ve had 20 some; thru birth of children, loss of blood, last 2 were in ~ 1999 before my cancer surgery" (09/16/2015)  . History of hiatal hernia   . History of shingles   . Hx of colonic polyps   . Hyperlipidemia   . Hypertension   . 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 Myocardial infarction ([place])    1997  . Pneumonia    X 1  . Postoperative hematoma involving circulatory system following cardiac catheterization 09/17/2015  . S/P cardiac cath 09/16/15 09/17/2015  . Type II diabetes mellitus ([place])   . 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 Venous insufficiency     Past Surgical History:  Procedure Laterality Date  . CARDIAC CATHETERIZATION  1997; 2008; 09/16/2015  . CARDIAC CATHETERIZATION N/A 09/16/2015   Procedure: Right/Left Heart Cath and Coronary/Graft Angiography;  Surgeon: Lorretta Harp, MD;  Location: [facility];  Service: Cardiovascular;  Laterality: N/A;  . CESAREAN SECTION  1966  . COLONOSCOPY    . CORONARY ARTERY BYPASS GRAFT  1997   CABG X5  . EYE SURGERY     2 laser surgeries on left eye with implant  . EYE SURGERY Bilateral    cataracts  . FRACTURE SURGERY    . HERNIA REPAIR    . LAPAROSCOPIC ASSISTED VENTRAL HERNIA REPAIR  09/2008    with incarcerated colon;  Dr. Lucia Gaskins  . MOHS SURGERY  06/2016   BCC  . PATELLA FRACTURE SURGERY Left 11/1994   "crushed my knee"; put in a plate & 6 screws"  . POLYPECTOMY    . REFRACTIVE SURGERY Left 07/30/2015  . 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 March Rummage  .  SHOULDER ARTHROSCOPY W/ ROTATOR CUFF REPAIR Right 1997  . SMALL INTESTINE SURGERY    . TONSILLECTOMY  1960s  . TOTAL KNEE ARTHROPLASTY WITH HARDWARE REMOVAL Left 12/1994    (infex, hardware removed )  . TUBAL LIGATION  1966    Social History   Socioeconomic History  . Marital status: Widowed    Spouse name: Not on file  . Number of children: 2  . Years of education: Not on file  . Highest education level: Not on file  Occupational History  . Occupation: retired-- Research officer, trade union: RETIRED  Social Needs  . Financial resource strain: Not on file  . Food insecurity:    Worry: Not on file    Inability: Not on file  . Transportation needs:    Medical: Not on file    Non-medical: Not on file  Tobacco Use  . Smoking status: Never Smoker  . Smokeless tobacco: Never Used  Substance and Sexual Activity  . Alcohol use: No    Alcohol/week: 0.0 standard drinks  . Drug use: No  . Sexual activity: Not Currently  Lifestyle  . Physical activity:    Days per week: Not on file    Minutes per session: Not on file  . Stress: Not on file  Relationships  . Social connections:    Talks on phone: Not on file    Gets together: Not on file    Attends religious service: Not on file    Active member of club or organization: Not on file    Attends meetings of clubs or organizations: Not on file    Relationship status: Not on file  . Intimate partner violence:    Fear of current or ex partner: Not on file    Emotionally abused: Not on file    Physically abused: Not on file    Forced sexual activity: Not on file  Other Topics Concern  . Not on file  Social History Narrative   Lives by herself, lost husband ~ 2004    1 child in [place]   I child in [place] as of 05/30/2018      Reactions   Codeine    REACTION: makes her nervous, orTylenol #3   Ibuprofen    REACTION: nervous   Meperidine Hcl    REACTION: nasuea and vomitting   Naproxen Sodium    REACTION: nervous       Medication List       Accurate as of May 30, 2018 11:59 PM. If you have any questions, ask your nurse or doctor.        amLODipine 5 MG tablet  Commonly known as:  NORVASC TAKE 1 TABLET(5 MG) BY MOUTH DAILY   aspirin 81 MG EC tablet Take 1 tablet (81 mg total) by mouth daily.   atenolol 100 MG tablet Commonly known as:  TENORMIN Take 1 tablet (100 mg total) by mouth daily.   atorvastatin 20 MG tablet Commonly known as:  LIPITOR TAKE 1 TABLET(20 MG) BY MOUTH DAILY   azelastine 0.1 % nasal spray Commonly known as:  ASTELIN Place 2 sprays into both nostrils at bedtime as needed for rhinitis. Use in each nostril as directed   cholecalciferol 1000 units tablet Commonly known as:  VITAMIN D Take 1,000 Units by mouth daily.   clopidogrel 75 MG tablet Commonly known as:  PLAVIX Take 1 tablet (75 mg total) by mouth daily.   clorazepate 7.5 MG tablet Commonly known as:  TRANXENE Take 1 tablet (7.5 mg total) by mouth as needed.   docusate sodium 100 MG capsule Commonly known as:  COLACE Take 200 mg by mouth daily as needed for mild constipation.   fluticasone 50 MCG/ACT nasal spray Commonly known as:  FLONASE SHAKE LIQUID AND USE 2 SPRAYS IN EACH NOSTRIL DAILY   HYDROcodone-acetaminophen 5-325 MG tablet Commonly known as:  NORCO/VICODIN Take 1 tablet by mouth 3 (three) times daily as needed for moderate pain.   insulin NPH Human 100 UNIT/ML injection Commonly known as:  NOVOLIN N 20-38 Units 2 (two) times daily before a meal. Inject 38 units into the skin every morning and inject 20 units into the skin at bedtime.   insulin regular 100 units/mL injection Commonly known as:  NOVOLIN R Inject 16 Units into the skin 2 (two) times daily before a meal.   INSULIN SYRINGE .5CC/31GX5/16" 31G X 5/16" 0.5 ML Misc USE TO INJECT INSULIN 5 TIMES PER DAY   irbesartan 300 MG tablet Commonly known as:  AVAPRO TAKE 1 TABLET(300 MG) BY MOUTH DAILY   isosorbide mononitrate 30  MG 24 hr tablet Commonly known as:  IMDUR TAKE 1 TABLET(30 MG) BY MOUTH DAILY   liraglutide 18 MG/3ML Sopn Commonly known as:  [place]oza ADMINISTER 1.2 MG UNDER THE SKIN EVERY DAY   metFORMIN 500 MG tablet Commonly known as:  GLUCOPHAGE Take 1 tablet by mouth every morning and 2 tablets by mouth every evening   mometasone 0.1 % cream Commonly known as:  ELOCON Use as directed   multivitamin capsule Take 1 capsule by mouth daily.   neomycin-polymyxin-hydrocortisone OTIC solution Commonly known as:  CORTISPORIN 2-3 drops in each ear three times daily   nitroGLYCERIN 0.3 MG SL tablet Commonly known as:  Nitrostat Place 1 tablet (0.3 mg total) under the tongue every 5 (five) minutes as needed for chest pain (ER if no better after 3 tablets). Started by:  Kathlene November, MD   omeprazole 20 MG capsule Commonly known as:  PRILOSEC Take 1 capsule (20 mg total) by mouth daily.   ONE TOUCH ULTRA SYSTEM KIT w/Device Kit 1 kit by Does not apply route once.   ONE TOUCH ULTRA TEST test strip Generic drug:  glucose blood USE TO TEST BLOOD SUGAR TWICE DAILY AS DIRECTED           Objective:   Physical Exam There were no vitals taken for this visit. This is a virtual phone visit, she is alert oriented x3, no apparent distress    Assessment    Assessment DM  Dr Dwyane Dee  +retinopathy  HTN Hyperlipidemia CRI creat ~1.4  (GFR ~ 38) Depression/anxiety:  tranxene qhs prn (takes rarely) MSK: -DJD: hydrocodone (rx pcp) - T score -0.8 on December 2017, h/o a foot FX d/t walking years ago (likely a stress fracture), discussed treatment 04/2016:  exercise and cont Vit d Hem/Onc: Dr Marin Olp  -CLL -iron deficiency anemia: IV iron 2018 -B12 def CAD, Dr Gwenlyn Found MI 97 >> CABG, cath 12-13-2006, myoview 2012 no ischemic CP: Stress test 08/05/2015, indeterminate risk study, + lateral ischemia: Cardiac catheterization 09/16/2015: Rx medical Venous insuff GI:  Dr Silverio Decamp ---GERD, IBS, h/o Gastritis  ---H/o ulcerative colitis as a child H/o shingles  BCC Dr Allyson Sabal, bx 04-2015, MOH's 06-2016   PLAN DM: Per Endo HTN: Continue atenolol, avapro, Imdur, recent BMP satisfactory.  Ambulatory BPs are "normal" per patient but no readings available. Hyperlipidemia: On Lipitor, due for FLP but will defer until next visit. CRI: Last BMP stable CAD: Has an atypical discomfort at rest, see review of systems.  For now we will continue aspirin, Plavix, Lipitor, ARB is, Imdur, and recommend to call with any change of symptoms.  I sent NTG to be used as needed CLL: She is extremely concerned about the elevated white count, I explained that she has a type of cancer that will keep her white cells elevated.  Fortunately her counts are stable and tried to reassure her. Blood per rectum: See last visit, no further symptoms, has been unable to see GI. RTC 3 to 4 months routine visit    I discussed the assessment and treatment plan with the patient. The patient was provided an opportunity to ask questions and all were answered. The patient agreed with the plan and demonstrated an understanding of the instructions.   The patient was advised to call back or seek an in-person evaluation if the symptoms worsen or if the condition fails to improve as anticipated.  I provided 22 minutes of non-face-to-face time during this encounter.  Kathlene November, MD

## 2018-05-31 NOTE — Assessment & Plan Note (Signed)
DM: Per Endo HTN: Continue atenolol, avapro, Imdur, recent BMP satisfactory.  Ambulatory BPs are "normal" per patient but no readings available. Hyperlipidemia: On Lipitor, due for FLP but will defer until next visit. CRI: Last BMP stable CAD: Has an atypical discomfort at rest, see review of systems.  For now we will continue aspirin, Plavix, Lipitor, ARB is, Imdur, and recommend to call with any change of symptoms.  I sent NTG to be used as needed CLL: She is extremely concerned about the elevated white count, I explained that she has a type of cancer that will keep her white cells elevated.  Fortunately her counts are stable and tried to reassure her. Blood per rectum: See last visit, no further symptoms, has been unable to see GI. RTC 3 to 4 months routine visit

## 2018-06-01 ENCOUNTER — Other Ambulatory Visit: Payer: Self-pay | Admitting: Cardiovascular Disease

## 2018-06-03 ENCOUNTER — Ambulatory Visit (INDEPENDENT_AMBULATORY_CARE_PROVIDER_SITE_OTHER): Payer: Medicare Other | Admitting: Endocrinology

## 2018-06-03 ENCOUNTER — Encounter: Payer: Self-pay | Admitting: Endocrinology

## 2018-06-03 ENCOUNTER — Other Ambulatory Visit: Payer: Self-pay

## 2018-06-03 VITALS — BP 150/80 | HR 74 | Ht 66.0 in | Wt 178.8 lb

## 2018-06-03 DIAGNOSIS — Z794 Long term (current) use of insulin: Secondary | ICD-10-CM

## 2018-06-03 DIAGNOSIS — N289 Disorder of kidney and ureter, unspecified: Secondary | ICD-10-CM | POA: Diagnosis not present

## 2018-06-03 DIAGNOSIS — E1165 Type 2 diabetes mellitus with hyperglycemia: Secondary | ICD-10-CM | POA: Diagnosis not present

## 2018-06-03 NOTE — Patient Instructions (Addendum)
Check blood sugars on waking up days a week  Also check blood sugars about 2 hours after meals and do this after different meals by rotation  Recommended blood sugar levels on waking up are 90-130 and about 2 hours after meal is 130-160  Please bring your blood sugar monitor to each visit, thank you  Reg insulin 14 not 16  Check sugar daily midday or afternoon  Call if sugars get low  Check Bp weekly

## 2018-06-03 NOTE — Progress Notes (Signed)
Patient ID: [name], female   DOB: 21-Jul-1940, 78 y.o.   MRN: [id number]            Reason for Appointment: Followup for Type 2 Diabetes   History of Present Illness:          Diagnosis: Type 2 diabetes mellitus, date of diagnosis: 1998       Past history:   She was treated with metformin at diagnosis when this had been continued until 2015 Subsequently Amaryl was also added several years ago and this has been continued Her blood sugars were under fair control between 2010 and early 2013 with A1c ranging from 7.4-9.4, mostly under 8% However since 08/2011 her A1c has been mostly over 8%  Insulin was added in 2014 with sma limited bottom ll doses of Lantus and this has been progressively increased She was started on mealtime insulin on her initial consultation in 9/15 because of high postprandial readings, sometimes over 300 With adding Victoza in 02/2014 her blood sugars were somewhat better with A1c coming down below 8%  Recent history:   INSULIN regimen: Regular 16 units before meals twice daily.  NPH 38 units in the morning and 20 hs  Non-insulin hypoglycemic drugs the patient is taking are: Victoza 1.2 mg daily. Metformin 500 in the morning and 1000 in the evening  A1c has been consistently under 7, now 6.2   Current blood sugar patterns, daily management and problems identified:  Fasting blood sugars: Blood sugars have relatively stable Compared to her last visit they are significantly better and not fluctuating as much  She has been back on full doses of 1500 mg metformin since her last visit Overall probably more active with yard work and blood sugars may be improving with this Also she is likely not eating a lot of snacks or large portions   Postprandial readings:  These are currently monitored only after her evening meal She has 2 blood sugars below 70 after her evening meal As before there is significant fluctuation in the blood sugars based on her diet  No blood sugars after lunch which is however a small meal or snack mostly  Insulin regimen:  She takes her insulin up to 30 minutes minutes before eating  She takes the same amount of REGULAR insulin for breakfast and dinner and not adjusting based on her blood sugar patterns and the dose has not been adjusted recently  Cannot afford brand-name insulin  No difficulty measuring the insulin in her syringes  VICTOZA: She has continued to take 1.2 mg daily without any side effects      Dinner 6-7 pm  Glucose monitoring:  done 1-2 times a day         Glucometer: One Touch.       Blood Glucose readings by time of day from download:   PRE-MEAL Fasting Lunch Dinner Bedtime Overall  Glucose range:  88-315      Mean/median:  119    121   POST-MEAL PC Breakfast PC Lunch PC Dinner  Glucose range:    58-241  Mean/median:    126   Previous blood sugars  PRE-MEAL Fasting Lunch Dinner Bedtime Overall  Glucose range:  135-225   150, 262  104-266   Mean/median:  173    167 169   POST-MEAL PC Breakfast PC Lunch PC Dinner  Glucose range: ?    Still not  Mean/median:       Self-care: The diet that the patient has  been following is: none, usually eating low fat meals Meals: 2-3 meals per day          Dietician visit, most recent: never.  She saw the CDE in 02/2014              Exercise: Some gardening  Weight history:  Wt Readings from Last 3 Encounters:  06/03/18 178 lb 12.8 oz (81.1 kg)  03/06/18 178 lb (80.7 kg)  02/19/18 177 lb (80.3 kg)    Glycemic control:   Lab Results  Component Value Date   HGBA1C 6.2 05/29/2018   HGBA1C 7.1 (H) 01/07/2018   HGBA1C 6.9 (H) 10/05/2017   Lab Results  Component Value Date   MICROALBUR 57.4 (H) 05/24/2017   LDLCALC 41 04/04/2017   CREATININE 1.34 (H) 05/29/2018     OTHER active problems  discussed in review of systems   Allergies as of 06/03/2018      Reactions   Codeine    REACTION: makes her nervous, orTylenol #3    Ibuprofen    REACTION: nervous   Meperidine Hcl    REACTION: nasuea and vomitting   Naproxen Sodium    REACTION: nervous      Medication List       Accurate as of June 03, 2018 10:15 AM. If you have any questions, ask your nurse or doctor.        STOP taking these medications   docusate sodium 100 MG capsule Commonly known as:  COLACE Stopped by:  Elayne Snare, MD     TAKE these medications   amLODipine 5 MG tablet Commonly known as:  NORVASC TAKE 1 TABLET(5 MG) BY MOUTH DAILY   aspirin 81 MG EC tablet Take 1 tablet (81 mg total) by mouth daily.   atenolol 100 MG tablet Commonly known as:  TENORMIN Take 1 tablet (100 mg total) by mouth daily.   atorvastatin 20 MG tablet Commonly known as:  LIPITOR TAKE 1 TABLET(20 MG) BY MOUTH DAILY   azelastine 0.1 % nasal spray Commonly known as:  ASTELIN Place 2 sprays into both nostrils at bedtime as needed for rhinitis. Use in each nostril as directed   cholecalciferol 1000 units tablet Commonly known as:  VITAMIN D Take 1,000 Units by mouth daily.   clopidogrel 75 MG tablet Commonly known as:  PLAVIX Take 1 tablet (75 mg total) by mouth daily.   clorazepate 7.5 MG tablet Commonly known as:  TRANXENE Take 1 tablet (7.5 mg total) by mouth as needed.   fluticasone 50 MCG/ACT nasal spray Commonly known as:  FLONASE SHAKE LIQUID AND USE 2 SPRAYS IN EACH NOSTRIL DAILY   HYDROcodone-acetaminophen 5-325 MG tablet Commonly known as:  NORCO/VICODIN Take 1 tablet by mouth 3 (three) times daily as needed for moderate pain.   insulin NPH Human 100 UNIT/ML injection Commonly known as:  NOVOLIN N 20-38 Units 2 (two) times daily before a meal. Inject 38 units into the skin every morning and inject 20 units into the skin at bedtime.   insulin regular 100 units/mL injection Commonly known as:  NOVOLIN R Inject 16 Units into the skin 2 (two) times daily before a meal.   INSULIN SYRINGE .5CC/31GX5/16" 31G X 5/16" 0.5 ML Misc USE  TO INJECT INSULIN 5 TIMES PER DAY   irbesartan 300 MG tablet Commonly known as:  AVAPRO TAKE 1 TABLET(300 MG) BY MOUTH DAILY   isosorbide mononitrate 30 MG 24 hr tablet Commonly known as:  IMDUR TAKE 1 TABLET(30 MG) BY  MOUTH DAILY   liraglutide 18 MG/3ML Sopn Commonly known as:  Victoza ADMINISTER 1.2 MG UNDER THE SKIN EVERY DAY   metFORMIN 500 MG tablet Commonly known as:  GLUCOPHAGE Take 1 tablet by mouth every morning and 2 tablets by mouth every evening   mometasone 0.1 % cream Commonly known as:  ELOCON Use as directed   multivitamin capsule Take 1 capsule by mouth daily.   neomycin-polymyxin-hydrocortisone OTIC solution Commonly known as:  CORTISPORIN 2-3 drops in each ear three times daily   nitroGLYCERIN 0.3 MG SL tablet Commonly known as:  Nitrostat Place 1 tablet (0.3 mg total) under the tongue every 5 (five) minutes as needed for chest pain (ER if no better after 3 tablets).   omeprazole 20 MG capsule Commonly known as:  PRILOSEC Take 1 capsule (20 mg total) by mouth daily.   ONE TOUCH ULTRA SYSTEM KIT w/Device Kit 1 kit by Does not apply route once.   ONE TOUCH ULTRA TEST test strip Generic drug:  glucose blood USE TO TEST BLOOD SUGAR TWICE DAILY AS DIRECTED       Allergies:  Allergies  Allergen Reactions  . Codeine     REACTION: makes her nervous, orTylenol #3  . Ibuprofen     REACTION: nervous  . Meperidine Hcl     REACTION: nasuea and vomitting  . Naproxen Sodium     REACTION: nervous    Past Medical History:  Diagnosis Date  . Acute blood loss anemia 09/17/2015  . Allergy   . Anemia   . Anginal pain ([place]) 2017  . Anxiety   . Arthritis    "hands and knees mostly" (09/16/2015)  . B12 deficiency anemia   . CAD (coronary artery disease)    Dr Gwenlyn Found  . Cataract    bil cataracts removed  . 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 Clotting disorder ([place])   . Depression   . DJD (degenerative joint disease)   .  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 Gastritis   . GERD (gastroesophageal reflux disease)   . Headache(784.0)   . History of blood transfusion    "I"ve had 20 some; thru birth of children, loss of blood, last 2 were in ~ 1999 before my cancer surgery" (09/16/2015)  . History of hiatal hernia   . History of shingles   . Hx of colonic polyps   . Hyperlipidemia   . Hypertension   . 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 Myocardial infarction ([place])    1997  . Pneumonia    X 1  . Postoperative hematoma involving circulatory system following cardiac catheterization 09/17/2015  . S/P cardiac cath 09/16/15 09/17/2015  . Type II diabetes mellitus ([place])   . 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 Venous insufficiency     Past Surgical History:  Procedure Laterality Date  . CARDIAC CATHETERIZATION  1997; 2008; 09/16/2015  . CARDIAC CATHETERIZATION N/A 09/16/2015   Procedure: Right/Left Heart Cath and Coronary/Graft Angiography;  Surgeon: Lorretta Harp, MD;  Location: [facility];  Service: Cardiovascular;  Laterality: N/A;  . CESAREAN SECTION  1966  . COLONOSCOPY    . CORONARY ARTERY BYPASS GRAFT  1997   CABG X5  . EYE SURGERY     2 laser surgeries on left eye with implant  . EYE SURGERY Bilateral    cataracts  . FRACTURE SURGERY    . HERNIA REPAIR    .  LAPAROSCOPIC ASSISTED VENTRAL HERNIA REPAIR  09/2008    with incarcerated colon;  Dr. Lucia Gaskins  . MOHS SURGERY  06/2016   B[facility]  . PATELLA FRACTURE SURGERY Left 11/1994   "crushed my knee"; put in a plate & 6 screws"  . POLYPECTOMY    . REFRACTIVE SURGERY Left 07/30/2015  . 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 March Rummage  . SHOULDER ARTHROSCOPY W/ ROTATOR CUFF REPAIR Right 1997  . SMALL INTESTINE SURGERY    . TONSILLECTOMY  1960s  . TOTAL KNEE ARTHROPLASTY WITH HARDWARE REMOVAL Left 12/1994    (infex, hardware removed )  . TUBAL LIGATION  1966    Family History   Problem Relation Age of Onset  . Heart disease Mother 12  . Heart disease Father 43       MI  . Hypertension Child   . Heart attack Child   . Diabetes Child   . Heart disease Maternal Aunt        x 2, all deceased  . Heart disease Maternal Uncle        x 4, all deceased  . Emphysema Brother 1  . Colon cancer Neg Hx   . Breast cancer Neg Hx   . Rectal cancer Neg Hx   . Stomach cancer Neg Hx     Social History:  reports that she has never smoked. She has never used smokeless tobacco. She reports that she does not drink alcohol or use drugs.    Review of Systems         Lipids: Has had excellent control of hypercholesterolemia with long-term use of Lipitor She has a history of coronary bypass surgery.   Also has been  on Niaspan, Prescribed by PCP Last labs as follows:       Lab Results  Component Value Date   CHOL 114 04/04/2017   HDL 38.30 (L) 04/04/2017   LDLCALC 41 04/04/2017   LDLDIRECT 36.0 05/26/2016   TRIG 174.0 (H) 04/04/2017   CHOLHDL 3 04/04/2017                  The blood pressure has been Treated with atenolol 100 mg, 5 mg amlodipine and half of 300 mg Avapro  Also on amlodipine 5 mg Followed by PCP She has not followed up recently and has not check blood pressure at home even though she has a meter  She does have a high microalbumin level  BP Readings from Last 3 Encounters:  06/03/18 (!) 150/80  03/06/18 (!) 199/72  02/19/18 126/72     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 creatinine has been higher in 2019 and still fluctuating   Also has mild microalbuminuria   Lab Results  Component Value Date   CREATININE 1.34 (H) 05/29/2018   CREATININE 1.46 (H) 03/06/2018   CREATININE 1.17 01/07/2018       No history of Numbness, tingling or burning in feet   Diabetic foot exam was in 5/19 showing mild neuropathy  History of CLL: Under the care of hematologist  Lab Results  Component Value Date   WBC 41.2 (H) 03/06/2018       Physical Examination:  BP (!)  150/80 (BP Location: Left Arm, Patient Position: Sitting, Cuff Size: Normal)   Pulse 74   Ht 5' 6"  (1.676 m)   Wt 178 lb 12.8 oz (81.1 kg)   SpO2 97%   BMI 28.86 kg/m   No pedal edema  Diabetes type 2, insulin requiring with BMI  29   See history of present illness for detailed discussion of her current blood sugar patterns, management and problems identified  Her A1c is much better at 6.2  Likely benefiting from increased metformin with better blood sugars and reduce variability Blood sugars are tending to be variable after supper including 2 readings below 70 She usually can recognize symptoms of hypoglycemia but does not check blood sugars after breakfast only in the afternoon routinely Her diet is generally fairly good  RENAL insufficiency: Her creatinine which was back to normal is mildly increased again  HYPERTENSION: Blood pressure is relatively high but she thinks this is from anxiety coming to the office today   PLAN:   No change in metformin currently  She will need to reduce her regular insulin by at least 2 units and take 14 units twice daily  No change in NPH as yet  Stressed the need to check her sugars midday and afternoons periodically and not every morning at breakfast time  To take regular insulin 30-minute before eating  Make sure she has some rapidly acting carbohydrate with her at all times when she is not at home  To call if she has any significant changes in her blood sugar  Recheck blood pressure regularly at home and report to PCP if not normal  Follow-up in 3 months  There are no Patient Instructions on file for this visit.   Elayne Snare 06/03/2018, 10:15 AM   Note: This office note was prepared with Dragon voice recognition system technology. Any transcriptional errors that result from this process are unintentional.

## 2018-06-09 ENCOUNTER — Other Ambulatory Visit: Payer: Self-pay | Admitting: Endocrinology

## 2018-06-14 ENCOUNTER — Ambulatory Visit (INDEPENDENT_AMBULATORY_CARE_PROVIDER_SITE_OTHER): Payer: Medicare Other | Admitting: Cardiovascular Disease

## 2018-06-14 ENCOUNTER — Encounter: Payer: Self-pay | Admitting: Cardiovascular Disease

## 2018-06-14 ENCOUNTER — Other Ambulatory Visit: Payer: Self-pay

## 2018-06-14 VITALS — BP 140/68 | HR 70 | Temp 98.0°F | Ht 66.0 in | Wt 178.0 lb

## 2018-06-14 DIAGNOSIS — I10 Essential (primary) hypertension: Secondary | ICD-10-CM | POA: Diagnosis not present

## 2018-06-14 DIAGNOSIS — Z951 Presence of aortocoronary bypass graft: Secondary | ICD-10-CM | POA: Diagnosis not present

## 2018-06-14 DIAGNOSIS — E785 Hyperlipidemia, unspecified: Secondary | ICD-10-CM | POA: Diagnosis not present

## 2018-06-14 DIAGNOSIS — R0609 Other forms of dyspnea: Secondary | ICD-10-CM | POA: Diagnosis not present

## 2018-06-14 NOTE — Assessment & Plan Note (Signed)
History of CAD status post myocardial infarction in 1997 at which time I placed an intra-aortic balloon pump and she had CABG x5 at that time by Dr. Dahlia Byes.  Because of chest pain a Myoview stress test was performed 08/05/2015 that showed new anterolateral ischemia.  Cardiac catheterization performed 09/16/2015 revealed patent grafts with an unbypassed nondominant circumflex and normal LV function.  Medical therapy was recommended.  She currently denies chest pain but has been complaining of increasing dyspnea on exertion.  We will check a 2D echocardiogram.

## 2018-06-14 NOTE — Assessment & Plan Note (Signed)
History of essential hypertension with blood pressure measured today 140/68.  She is on atenolol and amlodipine.

## 2018-06-14 NOTE — Assessment & Plan Note (Signed)
History of dyslipidemia on statin therapy with lipid profile performed 04/04/2017 revealing total cholesterol 114, LDL 41 and HDL 38.

## 2018-06-14 NOTE — Progress Notes (Signed)
06/14/2018 [place]   [birth year]  [id number]  Primary Physician Colon Branch, MD Primary Cardiologist: Lorretta Harp MD Lupe Carney, [place]  HPI:  [name] is a 78 y.o.  mildly overweight widowed Caucasian female mother of 2 living children (son at age 18 died of an overdose), grandmother of 5 grandchildren is a patient of Dr. Terance Ice. I last saw her in the office  10/12/2017. Her cardiac risk factor profile is walkover treated diabetes, hypertension and hyperlipidemia. She has a strong family history of heart disease with a father who died of a myocardial infarction at age 34 and her mother at age 25. She had a heart attack in 1997 at which time I performed cardiac catheterization. Apparently I placed a balloon pump at that time and sent her to open heart surgery. Dr. Tharon Aquas Trigt performed coronary artery bypass grafting x5. She had her last catheterization performed by Dr. Rollene Fare December 2008 revealing patent grafts with normal LV function. She had a negative Myoview back in 2012. Since I saw her years ago she's had several episodes of chest pain most recent one this past Sunday which was more intense and prolonged.She had a Myoview stress test performed 08/05/15 that showed new anterolateral ischemia. She's had several episodes of chest pain since I saw her back a month ago. She underwent outpatient cardiac catheterization 09/16/15 revealing patent grafts with an unbypassed nondominant circumflex are normal LV function. Medical therapy was recommended. She really has had no significant chest pain since her heart catheterization. She saw [name] in the office 04/19/16 with atypical chest pain, dyspnea or lower extremity edema. A 2-D echo was ordered and was normal. 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 She did get iron infusion by Dr.Ennover. Her symptoms have somewhat improved.  Since I saw her a  approximately 8 months ago she is done well.  She does gardening work.  She is sheltering in place and socially distancing.  Her major complaint is of increasing dyspnea on exertion although this seems to be a chronic complaint.  She denies chest pain.   Current Meds  Medication Sig  . amLODipine (NORVASC) 5 MG tablet TAKE 1 TABLET(5 MG) BY MOUTH DAILY  . aspirin EC 81 MG EC tablet Take 1 tablet (81 mg total) by mouth daily.  Marland Kitchen atenolol (TENORMIN) 100 MG tablet Take 1 tablet (100 mg total) by mouth daily.  Marland Kitchen atorvastatin (LIPITOR) 20 MG tablet TAKE 1 TABLET(20 MG) BY MOUTH DAILY  . azelastine (ASTELIN) 0.1 % nasal spray Place 2 sprays into both nostrils at bedtime as needed for rhinitis. Use in each nostril as directed  . Blood Glucose Monitoring Suppl (ONE TOUCH ULTRA SYSTEM KIT) W/DEVICE KIT 1 kit by Does not apply route once.  . cholecalciferol (VITAMIN D) 1000 UNITS tablet Take 1,000 Units by mouth daily.    . clopidogrel (PLAVIX) 75 MG tablet Take 1 tablet (75 mg total) by mouth daily.  . clorazepate (TRANXENE) 7.5 MG tablet Take 1 tablet (7.5 mg total) by mouth as needed.  . fluticasone (FLONASE) 50 MCG/ACT nasal spray SHAKE LIQUID AND USE 2 SPRAYS IN EACH NOSTRIL DAILY  . HYDROcodone-acetaminophen (NORCO/VICODIN) 5-325 MG tablet Take 1 tablet by mouth 3 (three) times daily as needed for moderate pain.  Marland Kitchen insulin NPH Human (HUMULIN N,NOVOLIN N) 100 UNIT/ML injection 20-38 Units 2 (two) times daily before a meal. Inject 38 units  into the skin every morning and inject 20 units into the skin at bedtime.  . insulin regular (NOVOLIN R,HUMULIN R) 100 units/mL injection Inject 16 Units into the skin 2 (two) times daily before a meal.   . Insulin Syringe-Needle U-100 (INSULIN SYRINGE .5CC/31GX5/16") 31G X 5/16" 0.5 ML MISC USE TO INJECT INSULIN 5 TIMES PER DAY  . irbesartan (AVAPRO) 300 MG tablet TAKE 1 TABLET(300 MG) BY MOUTH DAILY  . isosorbide mononitrate (IMDUR) 30 MG 24 hr tablet TAKE 1  TABLET(30 MG) BY MOUTH DAILY  . liraglutide (VICTOZA) 18 MG/3ML SOPN ADMINISTER 1.2 MG UNDER THE SKIN EVERY DAY  . metFORMIN (GLUCOPHAGE) 500 MG tablet Take 1 tablet by mouth every morning and 2 tablets by mouth every evening  . mometasone (ELOCON) 0.1 % cream Use as directed  . Multiple Vitamin (MULTIVITAMIN) capsule Take 1 capsule by mouth daily.    Marland Kitchen neomycin-polymyxin-hydrocortisone (CORTISPORIN) otic solution 2-3 drops in each ear three times daily (Patient taking differently: 2-3 drops in each ear three times daily)  . nitroGLYCERIN (NITROSTAT) 0.3 MG SL tablet Place 1 tablet (0.3 mg total) under the tongue every 5 (five) minutes as needed for chest pain (ER if no better after 3 tablets).  Marland Kitchen omeprazole (PRILOSEC) 20 MG capsule Take 1 capsule (20 mg total) by mouth daily.  . ONE TOUCH ULTRA TEST test strip USE TO TEST BLOOD SUGAR TWICE DAILY AS DIRECTED     Allergies  Allergen Reactions  . Codeine     REACTION: makes her nervous, orTylenol #3  . Ibuprofen     REACTION: nervous  . Meperidine Hcl     REACTION: nasuea and vomitting  . Naproxen Sodium     REACTION: nervous    Social History   Socioeconomic History  . Marital status: Widowed    Spouse name: Not on file  . Number of children: 2  . Years of education: Not on file  . Highest education level: Not on file  Occupational History  . Occupation: retired-- Research officer, trade union: RETIRED  Social Needs  . Financial resource strain: Not on file  . Food insecurity    Worry: Not on file    Inability: Not on file  . Transportation needs    Medical: Not on file    Non-medical: Not on file  Tobacco Use  . Smoking status: Never Smoker  . Smokeless tobacco: Never Used  Substance and Sexual Activity  . Alcohol use: No    Alcohol/week: 0.0 standard drinks  . Drug use: No  . Sexual activity: Not Currently  Lifestyle  . Physical activity    Days per week: Not on file    Minutes per session: Not on file  . Stress: Not  on file  Relationships  . Social Herbalist on phone: Not on file    Gets together: Not on file    Attends religious service: Not on file    Active member of club or organization: Not on file    Attends meetings of clubs or organizations: Not on file    Relationship status: Not on file  . Intimate partner violence    Fear of current or ex partner: Not on file    Emotionally abused: Not on file    Physically abused: Not on file    Forced sexual activity: Not on file  Other Topics Concern  . Not on file  Social History Narrative   Lives by herself, lost husband ~  2004    1 child in [place]   I child in [place]     Review of Systems: General: negative for chills, fever, night sweats or weight changes.  Cardiovascular: negative for chest pain, dyspnea on exertion, edema, orthopnea, palpitations, paroxysmal nocturnal dyspnea or shortness of breath Dermatological: negative for rash Respiratory: negative for cough or wheezing Urologic: negative for hematuria Abdominal: negative for nausea, vomiting, diarrhea, bright red blood per rectum, melena, or hematemesis Neurologic: negative for visual changes, syncope, or dizziness All other systems reviewed and are otherwise negative except as noted above.    Blood pressure 140/68, pulse 70, temperature 98 F (36.7 C), height _0  (1.676 m), weight 178 lb (80.7 kg), SpO2 96 %.  General appearance: alert and no distress Neck: no adenopathy, no carotid bruit, no JVD, supple, symmetrical, trachea midline and thyroid not enlarged, symmetric, no tenderness/mass/nodules Lungs: clear to auscultation bilaterally Heart: regular rate and rhythm, S1, S2 normal, no murmur, click, rub or gallop Extremities: extremities normal, atraumatic, no cyanosis or edema Pulses: 2+ and symmetric Skin: Skin color, texture, turgor normal. No rashes or lesions Neurologic: Alert and oriented X 3, normal strength and tone. Normal symmetric reflexes. Normal  coordination and gait  EKG not performed today  ASSESSMENT AND PLAN:   Dyslipidemia History of dyslipidemia on statin therapy with lipid profile performed 04/04/2017 revealing total cholesterol 114, LDL 41 and HDL 38.  Essential hypertension History of essential hypertension with blood pressure measured today 140/68.  She is on atenolol and amlodipine.  Hx of CABG History of CAD status post myocardial infarction in 1997 at which time I placed an intra-aortic balloon pump and she had CABG x5 at that time by Dr. Dahlia Byes.  Because of chest pain a Myoview stress test was performed 08/05/2015 that showed new anterolateral ischemia.  Cardiac catheterization performed 09/16/2015 revealed patent grafts with an unbypassed nondominant circumflex and normal LV function.  Medical therapy was recommended.  She currently denies chest pain but has been complaining of increasing dyspnea on exertion.  We will check a 2D echocardiogram.      Lorretta Harp MD [facility], [facility] 06/14/2018 10:32 AM

## 2018-06-14 NOTE — Patient Instructions (Addendum)
Medication Instructions:  Your physician recommends that you continue on your current medications as directed. Please refer to the Current Medication list given to you today.  If you need a refill on your cardiac medications before your next appointment, please call your pharmacy.   Lab work: NONE If you have labs (blood work) drawn today and your tests are completely normal, you will receive your results only by: Marland Kitchen MyChart Message (if you have MyChart) OR . A paper copy in the mail If you have any lab test that is abnormal or we need to change your treatment, we will call you to review the results.  Testing/Procedures: Your physician has requested that you have an echocardiogram. Echocardiography is a painless test that uses sound waves to create images of your heart. It provides your doctor with information about the size and shape of your heart and how well your heart's chambers and valves are working. This procedure takes approximately one hour. There are no restrictions for this procedure. LOCATION: [place], [place], [place] [address]   Follow-Up: At [facility], you and your health needs are our priority.  As part of our continuing mission to provide you with exceptional heart care, we have created designated Provider Care Teams.  These Care Teams include your primary Cardiologist (physician) and Advanced Practice Providers (APPs -  Physician Assistants and Nurse Practitioners) who all work together to provide you with the care you need, when you need it. You will need a follow up appointment in 3 months [place], PA-C AND IN 6 MONTHS WITH DR. Gwenlyn Found.  Please call our office 2 months in advance to schedule each appointment.

## 2018-06-27 ENCOUNTER — Telehealth (HOSPITAL_COMMUNITY): Payer: Self-pay | Admitting: Cardiology

## 2018-06-27 NOTE — Telephone Encounter (Signed)

## 2018-06-28 ENCOUNTER — Ambulatory Visit (HOSPITAL_COMMUNITY): Payer: Medicare Other | Attending: Internal Medicine

## 2018-06-28 ENCOUNTER — Other Ambulatory Visit: Payer: Self-pay

## 2018-06-28 DIAGNOSIS — R0609 Other forms of dyspnea: Secondary | ICD-10-CM | POA: Insufficient documentation

## 2018-06-28 DIAGNOSIS — Z951 Presence of aortocoronary bypass graft: Secondary | ICD-10-CM | POA: Insufficient documentation

## 2018-07-19 ENCOUNTER — Other Ambulatory Visit: Payer: Self-pay | Admitting: Internal Medicine

## 2018-07-28 ENCOUNTER — Other Ambulatory Visit: Payer: Self-pay | Admitting: Endocrinology

## 2018-08-09 ENCOUNTER — Other Ambulatory Visit: Payer: Self-pay | Admitting: Cardiovascular Disease

## 2018-08-16 ENCOUNTER — Telehealth: Payer: Self-pay | Admitting: Endocrinology

## 2018-08-16 ENCOUNTER — Other Ambulatory Visit: Payer: Self-pay

## 2018-08-16 MED ORDER — METFORMIN HCL 500 MG PO TABS: ORAL_TABLET | ORAL | 1 refills | Status: DC

## 2018-08-16 NOTE — Telephone Encounter (Signed)
Rx sent 

## 2018-08-16 NOTE — Telephone Encounter (Signed)
MEDICATION: metFORMIN (GLUCOPHAGE) 500 MG tablet  PHARMACY:   [place], [place] [phone] (Phone) [phone] (Fax)    IS THIS A 90 DAY SUPPLY : Yes  IS PATIENT OUT OF MEDICATION: almost  IF NOT; HOW MUCH IS LEFT: enough for a couple days  LAST APPOINTMENT DATE: @7 /26/2020  NEXT APPOINTMENT DATE:@9 /01/2018  DO WE HAVE YOUR PERMISSION TO LEAVE A DETAILED MESSAGE: Yes  OTHER COMMENTS:    **Let patient know to contact pharmacy at the end of the day to make sure medication is ready. **  ** Please notify patient to allow 48-72 hours to process**  **Encourage patient to contact the pharmacy for refills or they can request refills through [facility]**

## 2018-08-23 DIAGNOSIS — H43813 Vitreous degeneration, bilateral: Secondary | ICD-10-CM | POA: Diagnosis not present

## 2018-08-23 DIAGNOSIS — E113391 Type 2 diabetes mellitus with moderate nonproliferative diabetic retinopathy without macular edema, right eye: Secondary | ICD-10-CM | POA: Diagnosis not present

## 2018-08-23 DIAGNOSIS — E113492 Type 2 diabetes mellitus with severe nonproliferative diabetic retinopathy without macular edema, left eye: Secondary | ICD-10-CM | POA: Diagnosis not present

## 2018-08-23 DIAGNOSIS — H35432 Paving stone degeneration of retina, left eye: Secondary | ICD-10-CM | POA: Diagnosis not present

## 2018-08-27 ENCOUNTER — Emergency Department (HOSPITAL_COMMUNITY): Payer: Medicare Other

## 2018-08-27 ENCOUNTER — Encounter (HOSPITAL_COMMUNITY): Payer: Self-pay

## 2018-08-27 ENCOUNTER — Other Ambulatory Visit: Payer: Self-pay

## 2018-08-27 ENCOUNTER — Inpatient Hospital Stay (HOSPITAL_COMMUNITY)
Admission: EM | Admit: 2018-08-27 | Discharge: 2018-08-31 | DRG: 481 | Disposition: A | Discharge status: Skilled Nursing Facility | Payer: Medicare Other | Attending: Internal Medicine | Admitting: Internal Medicine

## 2018-08-27 DIAGNOSIS — R0902 Hypoxemia: Secondary | ICD-10-CM | POA: Diagnosis not present

## 2018-08-27 DIAGNOSIS — Z79891 Long term (current) use of opiate analgesic: Secondary | ICD-10-CM

## 2018-08-27 DIAGNOSIS — R278 Other lack of coordination: Secondary | ICD-10-CM | POA: Diagnosis not present

## 2018-08-27 DIAGNOSIS — E785 Hyperlipidemia, unspecified: Secondary | ICD-10-CM | POA: Diagnosis present

## 2018-08-27 DIAGNOSIS — Z85068 Personal history of other malignant neoplasm of small intestine: Secondary | ICD-10-CM

## 2018-08-27 DIAGNOSIS — Z79899 Other long term (current) drug therapy: Secondary | ICD-10-CM

## 2018-08-27 DIAGNOSIS — E1159 Type 2 diabetes mellitus with other circulatory complications: Secondary | ICD-10-CM | POA: Diagnosis present

## 2018-08-27 DIAGNOSIS — W19XXXA Unspecified fall, initial encounter: Secondary | ICD-10-CM | POA: Diagnosis not present

## 2018-08-27 DIAGNOSIS — N179 Acute kidney failure, unspecified: Secondary | ICD-10-CM | POA: Diagnosis present

## 2018-08-27 DIAGNOSIS — Z951 Presence of aortocoronary bypass graft: Secondary | ICD-10-CM

## 2018-08-27 DIAGNOSIS — Z794 Long term (current) use of insulin: Secondary | ICD-10-CM

## 2018-08-27 DIAGNOSIS — E875 Hyperkalemia: Secondary | ICD-10-CM | POA: Diagnosis present

## 2018-08-27 DIAGNOSIS — S72142D Displaced intertrochanteric fracture of left femur, subsequent encounter for closed fracture with routine healing: Secondary | ICD-10-CM | POA: Diagnosis not present

## 2018-08-27 DIAGNOSIS — S72145A Nondisplaced intertrochanteric fracture of left femur, initial encounter for closed fracture: Principal | ICD-10-CM

## 2018-08-27 DIAGNOSIS — E118 Type 2 diabetes mellitus with unspecified complications: Secondary | ICD-10-CM | POA: Diagnosis not present

## 2018-08-27 DIAGNOSIS — C9110 Chronic lymphocytic leukemia of B-cell type not having achieved remission: Secondary | ICD-10-CM | POA: Diagnosis present

## 2018-08-27 DIAGNOSIS — M2550 Pain in unspecified joint: Secondary | ICD-10-CM | POA: Diagnosis not present

## 2018-08-27 DIAGNOSIS — Z7982 Long term (current) use of aspirin: Secondary | ICD-10-CM | POA: Diagnosis not present

## 2018-08-27 DIAGNOSIS — Z96659 Presence of unspecified artificial knee joint: Secondary | ICD-10-CM | POA: Diagnosis present

## 2018-08-27 DIAGNOSIS — Z7902 Long term (current) use of antithrombotics/antiplatelets: Secondary | ICD-10-CM

## 2018-08-27 DIAGNOSIS — I2510 Atherosclerotic heart disease of native coronary artery without angina pectoris: Secondary | ICD-10-CM | POA: Diagnosis present

## 2018-08-27 DIAGNOSIS — W010XXA Fall on same level from slipping, tripping and stumbling without subsequent striking against object, initial encounter: Secondary | ICD-10-CM | POA: Diagnosis present

## 2018-08-27 DIAGNOSIS — Z419 Encounter for procedure for purposes other than remedying health state, unspecified: Secondary | ICD-10-CM

## 2018-08-27 DIAGNOSIS — S299XXA Unspecified injury of thorax, initial encounter: Secondary | ICD-10-CM | POA: Diagnosis not present

## 2018-08-27 DIAGNOSIS — K219 Gastro-esophageal reflux disease without esophagitis: Secondary | ICD-10-CM | POA: Diagnosis present

## 2018-08-27 DIAGNOSIS — E1122 Type 2 diabetes mellitus with diabetic chronic kidney disease: Secondary | ICD-10-CM | POA: Diagnosis present

## 2018-08-27 DIAGNOSIS — R079 Chest pain, unspecified: Secondary | ICD-10-CM | POA: Diagnosis not present

## 2018-08-27 DIAGNOSIS — R1312 Dysphagia, oropharyngeal phase: Secondary | ICD-10-CM | POA: Diagnosis not present

## 2018-08-27 DIAGNOSIS — D509 Iron deficiency anemia, unspecified: Secondary | ICD-10-CM | POA: Diagnosis present

## 2018-08-27 DIAGNOSIS — R52 Pain, unspecified: Secondary | ICD-10-CM | POA: Diagnosis not present

## 2018-08-27 DIAGNOSIS — N183 Chronic kidney disease, stage 3 unspecified: Secondary | ICD-10-CM

## 2018-08-27 DIAGNOSIS — I129 Hypertensive chronic kidney disease with stage 1 through stage 4 chronic kidney disease, or unspecified chronic kidney disease: Secondary | ICD-10-CM | POA: Diagnosis present

## 2018-08-27 DIAGNOSIS — S79912A Unspecified injury of left hip, initial encounter: Secondary | ICD-10-CM | POA: Diagnosis not present

## 2018-08-27 DIAGNOSIS — I252 Old myocardial infarction: Secondary | ICD-10-CM | POA: Diagnosis not present

## 2018-08-27 DIAGNOSIS — Z885 Allergy status to narcotic agent status: Secondary | ICD-10-CM

## 2018-08-27 DIAGNOSIS — M25552 Pain in left hip: Secondary | ICD-10-CM | POA: Diagnosis not present

## 2018-08-27 DIAGNOSIS — Z20828 Contact with and (suspected) exposure to other viral communicable diseases: Secondary | ICD-10-CM | POA: Diagnosis present

## 2018-08-27 DIAGNOSIS — I959 Hypotension, unspecified: Secondary | ICD-10-CM | POA: Diagnosis not present

## 2018-08-27 DIAGNOSIS — Z7401 Bed confinement status: Secondary | ICD-10-CM | POA: Diagnosis not present

## 2018-08-27 DIAGNOSIS — E1169 Type 2 diabetes mellitus with other specified complication: Secondary | ICD-10-CM | POA: Diagnosis present

## 2018-08-27 DIAGNOSIS — R1310 Dysphagia, unspecified: Secondary | ICD-10-CM | POA: Diagnosis present

## 2018-08-27 DIAGNOSIS — S2232XA Fracture of one rib, left side, initial encounter for closed fracture: Secondary | ICD-10-CM | POA: Diagnosis present

## 2018-08-27 DIAGNOSIS — Z7951 Long term (current) use of inhaled steroids: Secondary | ICD-10-CM

## 2018-08-27 DIAGNOSIS — R34 Anuria and oliguria: Secondary | ICD-10-CM | POA: Diagnosis present

## 2018-08-27 DIAGNOSIS — R2689 Other abnormalities of gait and mobility: Secondary | ICD-10-CM | POA: Diagnosis not present

## 2018-08-27 DIAGNOSIS — F329 Major depressive disorder, single episode, unspecified: Secondary | ICD-10-CM | POA: Diagnosis not present

## 2018-08-27 DIAGNOSIS — IMO0002 Reserved for concepts with insufficient information to code with codable children: Secondary | ICD-10-CM

## 2018-08-27 DIAGNOSIS — S72142A Displaced intertrochanteric fracture of left femur, initial encounter for closed fracture: Secondary | ICD-10-CM | POA: Diagnosis not present

## 2018-08-27 DIAGNOSIS — I4510 Unspecified right bundle-branch block: Secondary | ICD-10-CM | POA: Diagnosis not present

## 2018-08-27 DIAGNOSIS — Z886 Allergy status to analgesic agent status: Secondary | ICD-10-CM

## 2018-08-27 DIAGNOSIS — Z8249 Family history of ischemic heart disease and other diseases of the circulatory system: Secondary | ICD-10-CM | POA: Diagnosis not present

## 2018-08-27 DIAGNOSIS — Z03818 Encounter for observation for suspected exposure to other biological agents ruled out: Secondary | ICD-10-CM | POA: Diagnosis not present

## 2018-08-27 DIAGNOSIS — R110 Nausea: Secondary | ICD-10-CM | POA: Diagnosis not present

## 2018-08-27 DIAGNOSIS — S2232XD Fracture of one rib, left side, subsequent encounter for fracture with routine healing: Secondary | ICD-10-CM | POA: Diagnosis not present

## 2018-08-27 DIAGNOSIS — IMO0001 Reserved for inherently not codable concepts without codable children: Secondary | ICD-10-CM

## 2018-08-27 DIAGNOSIS — I10 Essential (primary) hypertension: Secondary | ICD-10-CM | POA: Diagnosis not present

## 2018-08-27 DIAGNOSIS — F419 Anxiety disorder, unspecified: Secondary | ICD-10-CM | POA: Diagnosis not present

## 2018-08-27 LAB — BASIC METABOLIC PANEL
Anion gap: 9 (ref 5–15)
BUN: 32 mg/dL — ABNORMAL HIGH (ref 8–23)
CO2: 23 mmol/L (ref 22–32)
Calcium: 9.5 mg/dL (ref 8.9–10.3)
Chloride: 110 mmol/L (ref 98–111)
Creatinine, Ser: 2.38 mg/dL — ABNORMAL HIGH (ref 0.44–1.00)
GFR calc Af Amer: 22 mL/min — ABNORMAL LOW (ref >60)
GFR calc non Af Amer: 19 mL/min — ABNORMAL LOW (ref >60)
Glucose, Bld: 94 mg/dL (ref 70–99)
Potassium: 4.4 mmol/L (ref 3.5–5.1)
Sodium: 142 mmol/L (ref 135–145)

## 2018-08-27 LAB — CBC WITH DIFFERENTIAL/PLATELET
Basophils Absolute: 0 10*3/uL (ref 0.0–0.1)
Basophils Relative: 0 %
Eosinophils Absolute: 0 10*3/uL (ref 0.0–0.5)
Eosinophils Relative: 0 %
HCT: 34.9 % — ABNORMAL LOW (ref 36.0–46.0)
Hemoglobin: 10.9 g/dL — ABNORMAL LOW (ref 12.0–15.0)
Lymphocytes Relative: 49 %
Lymphs Abs: 28.6 10*3/uL — ABNORMAL HIGH (ref 0.7–4.0)
MCH: 30.8 pg (ref 26.0–34.0)
MCHC: 31.2 g/dL (ref 30.0–36.0)
MCV: 98.6 fL (ref 80.0–100.0)
Monocytes Absolute: 2.3 10*3/uL — ABNORMAL HIGH (ref 0.1–1.0)
Monocytes Relative: 4 %
Neutro Abs: 27.4 10*3/uL — ABNORMAL HIGH (ref 1.7–7.7)
Neutrophils Relative %: 47 %
Platelets: 178 10*3/uL (ref 150–400)
RBC: 3.54 MIL/uL — ABNORMAL LOW (ref 3.87–5.11)
RDW: 13.4 % (ref 11.5–15.5)
WBC: 58.3 10*3/uL (ref 4.0–10.5)
nRBC: 0 % (ref 0.0–0.2)
nRBC: 0 /100 WBC

## 2018-08-27 LAB — PROTIME-INR
INR: 1.1 (ref 0.8–1.2)
Prothrombin Time: 14 seconds (ref 11.4–15.2)

## 2018-08-27 MED ORDER — SODIUM CHLORIDE 0.9 % IV BOLUS
1000.0000 mL | Freq: Once | INTRAVENOUS | Status: AC
  Administered 2018-08-28: 01:00:00 1000 mL via INTRAVENOUS

## 2018-08-27 MED ORDER — ONDANSETRON HCL 4 MG/2ML IJ SOLN
4.0000 mg | Freq: Once | INTRAMUSCULAR | Status: AC
  Administered 2018-08-27: 4 mg via INTRAVENOUS
  Filled 2018-08-27: qty 2

## 2018-08-27 MED ORDER — FENTANYL CITRATE (PF) 100 MCG/2ML IJ SOLN
50.0000 ug | Freq: Once | INTRAMUSCULAR | Status: AC
  Administered 2018-08-27: 50 ug via INTRAVENOUS
  Filled 2018-08-27: qty 2

## 2018-08-27 NOTE — ED Notes (Signed)
Patient transported to X-ray 

## 2018-08-27 NOTE — ED Provider Notes (Addendum)
[facility] DEPT Provider Note   CSN: [id number] Arrival date & time: 08/27/18  2150     History   Chief Complaint Chief Complaint  Patient presents with  . Hip Pain  . Fall    HPI [name] is a 78 y.o. female.     The history is provided by the patient and medical records.  Hip Pain  Fall     77 y.o. F with hx of anemia, anxiety, arthritis, B12 deficiency, CAD, CLL, degenerative joint disease, hyperlipidemia, hypertension, prior MI status post CABG, presenting to the ED after a fall.  Patient states she worked in her yard pretty much all day today and had gotten somewhat tired.  She was coming back from the store and lost her footing getting out of the car and fell onto her left side.  There was no head injury or loss of consciousness.  She complains of left hip pain down into her groin.  She has some abrasions to the left elbow but states that is mostly just "stinging" from the wound.  She denies any headache, dizziness, confusion, numbness, or weakness.  She is not having any neck or back pain.  She is on aspirin and Plavix.  Last oral intake around 2 PM.  Cardiologist-- Jacklynn Barnacle-- [place]  Past Medical History:  Diagnosis Date  . Acute blood loss anemia 09/17/2015  . Allergy   . Anemia   . Anginal pain ([place]) 2017  . Anxiety   . Arthritis    "hands and knees mostly" (09/16/2015)  . B12 deficiency anemia   . CAD (coronary artery disease)    Dr Gwenlyn Found  . Cataract    bil cataracts removed  . 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 Clotting disorder ([place])   . Depression   . DJD (degenerative joint disease)   . 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 Gastritis   . GERD (gastroesophageal reflux disease)   . Headache(784.0)   . History of blood transfusion    "I"ve had 20 some; thru birth of children, loss of blood, last 2 were in ~ 1999 before my cancer surgery" (09/16/2015)  . History of hiatal  hernia   . History of shingles   . Hx of colonic polyps   . Hyperlipidemia   . Hypertension   . 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 Myocardial infarction ([place])    1997  . Pneumonia    X 1  . Postoperative hematoma involving circulatory system following cardiac catheterization 09/17/2015  . S/P cardiac cath 09/16/15 09/17/2015  . Type II diabetes mellitus ([place])   . 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 Venous insufficiency     Patient Active Problem List   Diagnosis Date Noted  . Chest pain 04/19/2017  . IDA (iron deficiency anemia) 08/30/2016  . DOE (dyspnea on exertion) 04/19/2016  . Positive cardiac stress test   . PCP NOTES >>>>>>>>>>>>>>>>>>> 12/02/2014  . Annual physical exam  08/30/2014  . 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 Chronic kidney disease, stage III (moderate) ([place]) 09/18/2013  . Anxiety and depression 10/03/2010  . IBS (irritable bowel syndrome) 09/01/2010  . Vitamin B deficiency 05/20/2009  . HERNIA, VENTRAL 08/27/2008  . GASTRITIS, HX OF 08/27/2008  . 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 COLONIC POLYPS 12/20/2006  . VENOUS INSUFFICIENCY 12/20/2006  .  Insulin dependent diabetes mellitus with complications ([place]) 76/73/4193  . Dyslipidemia 10/16/2006  . Essential hypertension 10/16/2006  . Hx of CABG 10/16/2006  . GERD 10/16/2006  . 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 SHINGLES, HX OF 10/16/2006    Past Surgical History:  Procedure Laterality Date  . CARDIAC CATHETERIZATION  1997; 2008; 09/16/2015  . CARDIAC CATHETERIZATION N/A 09/16/2015   Procedure: Right/Left Heart Cath and Coronary/Graft Angiography;  Surgeon: Lorretta Harp, MD;  Location: [facility];  Service: Cardiovascular;  Laterality: N/A;  . CESAREAN SECTION  1966  . COLONOSCOPY    . CORONARY ARTERY BYPASS GRAFT  1997   CABG X5  . EYE SURGERY     2  laser surgeries on left eye with implant  . EYE SURGERY Bilateral    cataracts  . FRACTURE SURGERY    . HERNIA REPAIR    . LAPAROSCOPIC ASSISTED VENTRAL HERNIA REPAIR  09/2008    with incarcerated colon;  Dr. Lucia Gaskins  . MOHS SURGERY  06/2016   BCC  . PATELLA FRACTURE SURGERY Left 11/1994   "crushed my knee"; put in a plate & 6 screws"  . POLYPECTOMY    . REFRACTIVE SURGERY Left 07/30/2015  . 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 March Rummage  . SHOULDER ARTHROSCOPY W/ ROTATOR CUFF REPAIR Right 1997  . SMALL INTESTINE SURGERY    . TONSILLECTOMY  1960s  . TOTAL KNEE ARTHROPLASTY WITH HARDWARE REMOVAL Left 12/1994    (infex, hardware removed )  . TUBAL LIGATION  1966     OB History   No obstetric history on file.      Home Medications    Prior to Admission medications   Medication Sig Start Date End Date Taking? Authorizing Provider  amLODipine (NORVASC) 5 MG tablet TAKE 1 TABLET(5 MG) BY MOUTH DAILY 07/29/18   Elayne Snare, MD  aspirin EC 81 MG EC tablet Take 1 tablet (81 mg total) by mouth daily. 09/17/15   Isaiah Serge, NP  atenolol (TENORMIN) 100 MG tablet Take 1 tablet (100 mg total) by mouth daily. 05/10/18   Colon Branch, MD  atorvastatin (LIPITOR) 20 MG tablet TAKE 1 TABLET(20 MG) BY MOUTH DAILY 03/26/18   Lorretta Harp, MD  azelastine (ASTELIN) 0.1 % nasal spray Place 2 sprays into both nostrils at bedtime as needed for rhinitis. Use in each nostril as directed 10/26/15   Colon Branch, MD  Blood Glucose Monitoring Suppl (ONE TOUCH ULTRA SYSTEM KIT) W/DEVICE KIT 1 kit by Does not apply route once. 05/17/12   Parrett, Fonnie Mu, NP  cholecalciferol (VITAMIN D) 1000 UNITS tablet Take 1,000 Units by mouth daily.      [provider]  clopidogrel (PLAVIX) 75 MG tablet Take 1 tablet (75 mg total) by mouth daily. 07/19/18   Colon Branch, MD  clorazepate (TRANXENE) 7.5 MG tablet Take 1 tablet (7.5 mg total) by mouth as needed. 10/04/17   Colon Branch, MD  fluticasone  [facility]) 50 MCG/ACT nasal spray SHAKE LIQUID AND USE 2 SPRAYS IN [facility] NOSTRIL DAILY 09/19/17   Colon Branch, MD  HYDROcodone-acetaminophen (NORCO/VICODIN) 5-325 MG tablet Take 1 tablet by mouth 3 (three) times daily as needed for moderate pain. 05/23/18   Colon Branch, MD  insulin NPH Human (HUMULIN N,NOVOLIN N) 100 UNIT/ML injection 20-38 Units 2 (two) times daily before a meal. Inject 38 units into the skin every morning and inject 20 units into the skin  at bedtime.    [provider]  insulin regular (NOVOLIN R,HUMULIN R) 100 units/mL injection Inject 16 Units into the skin 2 (two) times daily before a meal.     [provider]  Insulin Syringe-Needle U-100 (INSULIN SYRINGE .5CC/31GX5/16") 31G X 5/16" 0.5 ML MISC USE TO INJECT INSULIN 5 TIMES PER DAY 11/05/15   Elayne Snare, MD  irbesartan (AVAPRO) 300 MG tablet TAKE 1 TABLET(300 MG) BY MOUTH DAILY 08/12/18   Lorretta Harp, MD  isosorbide mononitrate (IMDUR) 30 MG 24 hr tablet TAKE 1 TABLET(30 MG) BY MOUTH DAILY 06/03/18   Lorretta Harp, MD  liraglutide (VICTOZA) 18 MG/3ML SOPN ADMINISTER 1.2 MG UNDER THE SKIN EVERY DAY 06/10/18   Elayne Snare, MD  metFORMIN (GLUCOPHAGE) 500 MG tablet Take 1 tablet by mouth every morning and 2 tablets by mouth every evening 08/16/18   Elayne Snare, MD  mometasone (ELOCON) 0.1 % cream Use as directed 04/18/16   [provider]  Multiple Vitamin (MULTIVITAMIN) capsule Take 1 capsule by mouth daily.      [provider]  neomycin-polymyxin-hydrocortisone (CORTISPORIN) otic solution 2-3 drops in each ear three times daily Patient taking differently: 2-3 drops in each ear three times daily 08/07/11   Noralee Space, MD  nitroGLYCERIN (NITROSTAT) 0.3 MG SL tablet Place 1 tablet (0.3 mg total) under the tongue every 5 (five) minutes as needed for chest pain (ER if no better after 3 tablets). 05/30/18   Colon Branch, MD  omeprazole (PRILOSEC) 20 MG capsule Take 1 capsule (20 mg total) by mouth  daily. 10/15/17   Mauri Pole, MD  ONE TOUCH ULTRA TEST test strip USE TO TEST BLOOD SUGAR TWICE DAILY AS DIRECTED 03/26/18   Elayne Snare, MD    Family History Family History  Problem Relation Age of Onset  . Heart disease Mother 71  . Heart disease Father 79       MI  . Hypertension Child   . Heart attack Child   . Diabetes Child   . Heart disease Maternal Aunt        x 2, all deceased  . Heart disease Maternal Uncle        x 4, all deceased  . Emphysema Brother 15  . Colon cancer Neg Hx   . Breast cancer Neg Hx   . Rectal cancer Neg Hx   . Stomach cancer Neg Hx     Social History Social History   Tobacco Use  . Smoking status: Never Smoker  . Smokeless tobacco: Never Used  Substance Use Topics  . Alcohol use: No    Alcohol/week: 0.0 standard drinks  . Drug use: No     Allergies   Codeine, Ibuprofen, Meperidine hcl, and Naproxen sodium   Review of Systems Review of Systems  Musculoskeletal: Positive for arthralgias.  All other systems reviewed and are negative.    Physical Exam Updated Vital Signs BP (!) 84/59   Pulse 77   Temp 97.8 F (36.6 C) (Oral)   Resp 15   SpO2 100%   Physical Exam Vitals signs and nursing note reviewed.  Constitutional:      Appearance: She is well-developed.  HENT:     Head: Normocephalic and atraumatic.     Comments: No head trauma noted Eyes:     Conjunctiva/sclera: Conjunctivae normal.     Pupils: Pupils are equal, round, and reactive to light.  Neck:     Musculoskeletal: Normal range of motion.  Cardiovascular:     Rate and Rhythm: Normal rate and regular rhythm.     Heart sounds: Normal heart sounds.  Pulmonary:     Effort: Pulmonary effort is normal.     Breath sounds: Normal breath sounds.  Abdominal:     General: Bowel sounds are normal.     Palpations: Abdomen is soft.  Musculoskeletal: Normal range of motion.     Comments: Sheet tied around pelvis for stability Left hip is TTP with leg  shortening and external rotation Abrasions to left elbow, ranging elbow without difficulty or pain  Skin:    General: Skin is warm and dry.  Neurological:     Mental Status: She is alert and oriented to person, place, and time.     Comments: Awake, alert, oriented; moving extremities well aside from left leg due to pain      ED Treatments / Results  Labs (all labs ordered are listed, but only abnormal results are displayed) Labs Reviewed  BASIC METABOLIC PANEL - Abnormal; Notable for the following components:      Result Value   BUN 32 (*)    Creatinine, Ser 2.38 (*)    GFR calc non Af Amer 19 (*)    GFR calc Af Amer 22 (*)    All other components within normal limits  CBC WITH DIFFERENTIAL/PLATELET - Abnormal; Notable for the following components:   WBC 58.3 (*)    RBC 3.54 (*)    Hemoglobin 10.9 (*)    HCT 34.9 (*)    Neutro Abs 27.4 (*)    Lymphs Abs 28.6 (*)    Monocytes Absolute 2.3 (*)    All other components within normal limits  SARS CORONAVIRUS 2  PROTIME-INR  TYPE AND SCREEN    EKG EKG Interpretation  Date/Time:  Tuesday August 27 2018 22:40:26 EDT Ventricular Rate:  74 PR Interval:    QRS Duration: 136 QT Interval:  437 QTC Calculation: 485 R Axis:   46 Text Interpretation:  Sinus rhythm Right bundle branch block No acute changes No significant change since last tracing Confirmed by Varney Biles ([id number]) on 08/27/2018 11:13:41 PM   Radiology Dg Chest 1 View  Result Date: 08/27/2018 CLINICAL DATA:  Hip fracture.  Fall. EXAM: CHEST  1 VIEW COMPARISON:  07/25/2017 FINDINGS: Prior CABG. Scarring in the lingula. Heart is borderline in size. Right lung clear. No effusions. No acute bony abnormality. IMPRESSION: Prior CABG.  Lingular scarring.  No active disease. Electronically Signed   By: Rolm Baptise M.D.   On: 08/27/2018 22:30   Dg Hip Unilat With Pelvis 2-3 Views Left  Result Date: 08/27/2018 CLINICAL DATA:  Fall, hip pain EXAM: DG HIP (WITH OR  WITHOUT PELVIS) 2-3V LEFT COMPARISON:  None. FINDINGS: There is a left femoral intertrochanteric fracture. This is nondisplaced. No angulation. No subluxation or dislocation. IMPRESSION: No displaced left femoral intertrochanteric fracture. Electronically Signed   By: Rolm Baptise M.D.   On: 08/27/2018 22:31    Procedures Procedures (including critical care time)  Medications Ordered in ED Medications  sodium chloride 0.9 % bolus 1,000 mL (has no administration in time range)  fentaNYL (SUBLIMAZE) injection 50 mcg (50 mcg Intravenous Given 08/27/18 2252)  ondansetron (ZOFRAN) injection 4 mg (4 mg Intravenous Given 08/27/18 2252)     Initial Impression / Assessment and Plan / ED Course  I have reviewed the triage vital signs and the nursing notes.  Pertinent labs & imaging results that were available during my care  of the patient were reviewed by me and considered in my medical decision making (see chart for details).  78 year old female here after a fall while getting out of her car.  States her feet got tangled up and she fell onto the left hip.  She denies any head injury or loss of consciousness.  She has pain isolated to the left hip, there is some leg shortening and external rotation.  Some abrasions to the left elbow but no pain with range of motion.  X-ray does confirm nondisplaced intertrochanteric fracture.  Leg remains neurovascularly intact.  Will discuss with orthopedics and plan to admit to medicine.  Labs do reveal AKI-- SrCr 2.38 and BUN 32.  Question some dehydration as she was out in the yard all day working.  Will give IVF.  WBC count 58.3-- has history of CLL so not unexpected.  No infectious symptoms.  11:20 PM Discussed with Dr. Doran Durand-- team will see in the AM.  Hold ASA/plavis, NPO after midnight.  Discussed with Dr. Posey Pronto-- will admit for ongoing care.  COVID screen pending but patient is asymptomatic from respiratory standpoint.  Final Clinical Impressions(s) / ED  Diagnoses   Final diagnoses:  Closed nondisplaced intertrochanteric fracture of left femur, initial encounter [facility])    ED Discharge Orders    None       Larene Pickett, PA-C 08/27/18 2347    Larene Pickett, PA-C 08/27/18 2348    Varney Biles, MD 09/03/18 1332

## 2018-08-27 NOTE — H&P (Signed)
History and Physical    [name] JOI:[id number] DOB: [birth year] DOA: 08/27/2018  PCP: Colon Branch, MD  Patient coming from: Home  I have personally briefly reviewed patient's old medical records in [place]  Chief Complaint: Left hip pain after a fall  HPI: [name] is a 78 y.o. 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  Patient states she was in her usual state of health today.  She was outside working in the yard from 2 PM to 8 PM.  After finishing up she was backing her car up.  She walked out of her car was pouring a cup of tea out when she stumbled outside and fell onto the ground with her hip landing on the cement.  She has significant pain and was unable to stand on her own power or move her left lower extremity much.  She denies any injury to her head or loss of consciousness.  She denies any lightheadedness, dizziness, chest pain, palpitations, cough, dyspnea, abdominal pain, dysuria.  Patient does report about 4-5 episodes of diarrhea day prior to admission which occurred after eating a salmon patty and pinto beans.  She says she has not had any further episodes today (08/27/2018).  ED Course:  Initial vitals showed BP 130/57, pulse 73, RR 13, temp 97.8 Fahrenheit, SPO2 99% on room air.  Labs notable for WBC 58.3 (previously 41.2), hemoglobin 10.9, platelets 178,000, BUN 32, creatinine 2.38, sodium 142, potassium 4.4, bicarb 23, serum glucose 94.  Screening SARS-CoV-2 test was obtained and pending.  Portable chest x-ray showed prior CABG changes with scarring in the lingula, no focal consolidation, effusion, or edema.  Left hip x-ray showed a nondisplaced left femoral intertrochanteric fracture.  Patient was given 1 L normal  saline and fentanyl for pain.  EDP discussed the case with on-call [facility], Dr. Doran Durand, who recommended medical admission and plan for surgical intervention tomorrow.  The hospitalist service was consulted to admit.   Review of Systems: All systems reviewed and are negative except as documented in history of present illness above.   Past Medical History:  Diagnosis Date  . Acute blood loss anemia 09/17/2015  . Allergy   . Anemia   . Anginal pain ([place]) 2017  . Anxiety   . Arthritis    "hands and knees mostly" (09/16/2015)  . B12 deficiency anemia   . CAD (coronary artery disease)    Dr Gwenlyn Found  . Cataract    bil cataracts removed  . CLL (chronic lymphocytic leukemia) ([place]) 02/25/2014  . Clotting disorder ([place])   . Depression   . DJD (degenerative joint disease)   . 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 Gastritis   . GERD (gastroesophageal reflux disease)   . Headache(784.0)   . History of blood transfusion    "I"ve had 20 some; thru birth of children, loss of blood, last 2 were in ~ 1999 before my cancer surgery" (09/16/2015)  . History of hiatal hernia   . History of shingles   . Hx of colonic polyps   . Hyperlipidemia   . Hypertension   . 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 Myocardial infarction ([place])    1997  . Pneumonia    X 1  .  Postoperative hematoma involving circulatory system following cardiac catheterization 09/17/2015  . S/P cardiac cath 09/16/15 09/17/2015  . Type II diabetes mellitus ([place])   . 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 Venous insufficiency     Past Surgical History:  Procedure Laterality Date  . CARDIAC CATHETERIZATION  1997; 2008; 09/16/2015  . CARDIAC CATHETERIZATION N/A 09/16/2015   Procedure: Right/Left Heart Cath and Coronary/Graft Angiography;  Surgeon: Lorretta Harp, MD;  Location: [place];  Service: Cardiovascular;  Laterality: N/A;  . CESAREAN SECTION  1966  .  COLONOSCOPY    . CORONARY ARTERY BYPASS GRAFT  1997   CABG X5  . EYE SURGERY     2 laser surgeries on left eye with implant  . EYE SURGERY Bilateral    cataracts  . FRACTURE SURGERY    . HERNIA REPAIR    . LAPAROSCOPIC ASSISTED VENTRAL HERNIA REPAIR  09/2008    with incarcerated colon;  Dr. Lucia Gaskins  . MOHS SURGERY  06/2016   [facility]  . PATELLA FRACTURE SURGERY Left 11/1994   "crushed my knee"; put in a plate & 6 screws"  . POLYPECTOMY    . REFRACTIVE SURGERY Left 07/30/2015  . 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 March Rummage  . SHOULDER ARTHROSCOPY W/ ROTATOR CUFF REPAIR Right 1997  . SMALL INTESTINE SURGERY    . TONSILLECTOMY  1960s  . TOTAL KNEE ARTHROPLASTY WITH HARDWARE REMOVAL Left 12/1994    (infex, hardware removed )  . TUBAL LIGATION  1966    Social History:  reports that she has never smoked. She has never used smokeless tobacco. She reports that she does not drink alcohol or use drugs.  Allergies  Allergen Reactions  . Codeine     REACTION: makes her nervous, orTylenol #3  . Ibuprofen     REACTION: nervous  . Meperidine Hcl     REACTION: nasuea and vomitting  . Naproxen Sodium     REACTION: nervous    Family History  Problem Relation Age of Onset  . Heart disease Mother 75  . Heart disease Father 53       MI  . Hypertension Child   . Heart attack Child   . Diabetes Child   . Heart disease Maternal Aunt        x 2, all deceased  . Heart disease Maternal Uncle        x 4, all deceased  . Emphysema Brother 35  . Colon cancer Neg Hx   . Breast cancer Neg Hx   . Rectal cancer Neg Hx   . Stomach cancer Neg Hx      Prior to Admission medications   Medication Sig Start Date End Date Taking? Authorizing Provider  amLODipine (NORVASC) 5 MG tablet TAKE 1 TABLET(5 MG) BY MOUTH DAILY Patient taking differently: Take 5 mg by mouth daily. TAKE 1 TABLET(5 MG) BY MOUTH DAILY 07/29/18  Yes Elayne Snare, MD  aspirin EC 81 MG EC tablet Take 1 tablet (81 mg  total) by mouth daily. 09/17/15  Yes Isaiah Serge, NP  atenolol (TENORMIN) 100 MG tablet Take 1 tablet (100 mg total) by mouth daily. 05/10/18  Yes Paz, Alda Berthold, MD  atorvastatin (LIPITOR) 20 MG tablet TAKE 1 TABLET(20 MG) BY MOUTH DAILY Patient taking differently: Take 20 mg by mouth daily.  03/26/18  Yes Lorretta Harp, MD  azelastine (ASTELIN) 0.1 % nasal spray Place 2  sprays into both nostrils at bedtime as needed for rhinitis. Use in each nostril as directed 10/26/15  Yes Paz, Alda Berthold, MD  cholecalciferol (VITAMIN D) 1000 UNITS tablet Take 1,000 Units by mouth daily.     Yes [provider]  clopidogrel (PLAVIX) 75 MG tablet Take 1 tablet (75 mg total) by mouth daily. 07/19/18  Yes Paz, Alda Berthold, MD  clorazepate (TRANXENE) 7.5 MG tablet Take 1 tablet (7.5 mg total) by mouth as needed. Patient taking differently: Take 7.5 mg by mouth daily as needed for anxiety or sleep.  10/04/17  Yes Paz, Alda Berthold, MD  fluticasone (FLONASE) 50 MCG/ACT nasal spray SHAKE LIQUID AND USE 2 SPRAYS IN EACH NOSTRIL DAILY Patient taking differently: Place 2 sprays into both nostrils daily as needed for allergies.  09/19/17  Yes Paz, Alda Berthold, MD  HYDROcodone-acetaminophen (NORCO/VICODIN) 5-325 MG tablet Take 1 tablet by mouth 3 (three) times daily as needed for moderate pain. 05/23/18  Yes Paz, Alda Berthold, MD  insulin NPH Human (HUMULIN N,NOVOLIN N) 100 UNIT/ML injection 20-38 Units 2 (two) times daily before a meal. Inject 38 units into the skin every morning and inject 20 units into the skin at bedtime.   Yes [provider]  insulin regular (NOVOLIN R,HUMULIN R) 100 units/mL injection Inject 14 Units into the skin 2 (two) times daily before a meal.    Yes [provider]  irbesartan (AVAPRO) 300 MG tablet TAKE 1 TABLET(300 MG) BY MOUTH DAILY Patient taking differently: Take 300 mg by mouth every evening.  08/12/18  Yes Lorretta Harp, MD  isosorbide mononitrate (IMDUR) 30 MG 24 hr tablet TAKE 1  TABLET(30 MG) BY MOUTH DAILY Patient taking differently: Take 30 mg by mouth daily. TAKE 1 TABLET(30 MG) BY MOUTH DAILY 06/03/18  Yes Lorretta Harp, MD  liraglutide (VICTOZA) 18 MG/3ML SOPN ADMINISTER 1.2 MG UNDER THE SKIN EVERY DAY Patient taking differently: Inject 1.6 mg into the skin every evening. ADMINISTER 1.6 MG UNDER THE SKIN EVERY DAY 06/10/18  Yes Elayne Snare, MD  metFORMIN (GLUCOPHAGE) 500 MG tablet Take 1 tablet by mouth every morning and 2 tablets by mouth every evening 08/16/18  Yes Elayne Snare, MD  Multiple Vitamin (MULTIVITAMIN) capsule Take 1 capsule by mouth daily.     Yes [provider]  nitroGLYCERIN (NITROSTAT) 0.3 MG SL tablet Place 1 tablet (0.3 mg total) under the tongue every 5 (five) minutes as needed for chest pain (ER if no better after 3 tablets). 05/30/18  Yes Paz, Alda Berthold, MD  omeprazole (PRILOSEC) 20 MG capsule Take 1 capsule (20 mg total) by mouth daily. 10/15/17  Yes Nandigam, Venia Minks, MD  Blood Glucose Monitoring Suppl (ONE TOUCH ULTRA SYSTEM KIT) W/DEVICE KIT 1 kit by Does not apply route once. 05/17/12   Parrett, Fonnie Mu, NP  Insulin Syringe-Needle U-100 (INSULIN SYRINGE .5CC/31GX5/16") 31G X 5/16" 0.5 ML MISC USE TO INJECT INSULIN 5 TIMES PER DAY 11/05/15   Elayne Snare, MD  mometasone (ELOCON) 0.1 % cream Apply 1 application topically daily. Use as directed 04/18/16   [provider]  neomycin-polymyxin-hydrocortisone (CORTISPORIN) otic solution 2-3 drops in each ear three times daily Patient not taking: Reported on 08/27/2018 08/07/11   Noralee Space, MD  ONE [facility] ULTRA TEST test strip USE TO TEST BLOOD SUGAR TWICE DAILY AS DIRECTED 03/26/18   Elayne Snare, MD    Physical Exam: Vitals:   08/27/18 2215 08/27/18 2240 08/27/18 2245 08/27/18 2330  BP: (!) 141/67 Marland Kitchen)  138/59 (!) 130/57 (!) 84/59  Pulse: 73 75 73 77  Resp: _0 Temp: 97.8 F (36.6 C)     TempSrc: Oral     SpO2: 98% 98% 99% 100%    Constitutional: Resting supine in bed,  NAD, calm, intermittent pain when moving left hip otherwise comfortable Eyes: PERRL, lids and conjunctivae normal ENMT: Mucous membranes are moist. Posterior pharynx clear of any exudate or lesions. Neck: normal, supple, no masses. Respiratory: clear to auscultation anteriorly, no wheezing, no crackles. Normal respiratory effort. No accessory muscle use.  Cardiovascular: Regular rate and rhythm, no murmurs / rubs / gallops. No extremity edema. 2+ pedal pulses. Abdomen: no tenderness, no masses palpated. No hepatosplenomegaly. Bowel sounds positive.  Musculoskeletal: no clubbing / cyanosis. No joint deformity upper and lower extremities.  ROM LLE diminished due to left hip fracture. Skin: no rashes, lesions, ulcers. No induration Neurologic: CN 2-12 grossly intact. Sensation intact, Strength 5/5 in upper and RLE, diminished LLE due to hip fracture.  Psychiatric: Normal judgment and insight. Alert and oriented x 3. Normal mood.     Labs on Admission: I have personally reviewed following labs and imaging studies  CBC: Recent Labs  Lab 08/27/18 2210  WBC 58.3*  NEUTROABS 27.4*  HGB 10.9*  HCT 34.9*  MCV 98.6  PLT 468   Basic Metabolic Panel: Recent Labs  Lab 08/27/18 2210  NA 142  K 4.4  CL 110  CO2 23  GLUCOSE 94  BUN 32*  CREATININE 2.38*  CALCIUM 9.5   GFR: CrCl cannot be calculated (Unknown ideal weight.). Liver Function Tests: No results for input(s): AST, ALT, ALKPHOS, BILITOT, PROT, ALBUMIN in the last 168 hours. No results for input(s): LIPASE, AMYLASE in the last 168 hours. No results for input(s): AMMONIA in the last 168 hours. Coagulation Profile: Recent Labs  Lab 08/27/18 2210  INR 1.1   Cardiac Enzymes: No results for input(s): CKTOTAL, CKMB, CKMBINDEX, TROPONINI in the last 168 hours. BNP (last 3 results) No results for input(s): PROBNP in the last 8760 hours. HbA1C: No results for input(s): HGBA1C in the last 72 hours. CBG: No results for  input(s): GLUCAP in the last 168 hours. Lipid Profile: No results for input(s): CHOL, HDL, LDLCALC, TRIG, CHOLHDL, LDLDIRECT in the last 72 hours. Thyroid Function Tests: No results for input(s): TSH, T4TOTAL, FREET4, T3FREE, THYROIDAB in the last 72 hours. Anemia Panel: No results for input(s): VITAMINB12, FOLATE, FERRITIN, TIBC, IRON, RETICCTPCT in the last 72 hours. Urine analysis:    Component Value Date/Time   COLORURINE YELLOW 08/15/2008 0042   APPEARANCEUR CLEAR 08/15/2008 0042   LABSPEC 1.017 08/15/2008 0042   PHURINE 5.0 08/15/2008 0042   GLUCOSEU 100 (A) 08/15/2008 0042   GLUCOSEU NEGATIVE 06/30/2008 0841   HGBUR NEGATIVE 08/15/2008 0042   BILIRUBINUR negative 08/02/2017 1023   KETONESUR negative 08/02/2017 1023   KETONESUR NEGATIVE 08/15/2008 0042   PROTEINUR =100 (A) 08/02/2017 1023   PROTEINUR NEGATIVE 08/15/2008 0042   UROBILINOGEN 0.2 08/02/2017 1023   UROBILINOGEN 0.2 08/15/2008 0042   NITRITE Negative 08/02/2017 1023   NITRITE NEGATIVE 08/15/2008 0042   LEUKOCYTESUR Trace (A) 08/02/2017 1023    Radiological Exams on Admission: Dg Chest 1 View  Result Date: 08/27/2018 CLINICAL DATA:  Hip fracture.  Fall. EXAM: CHEST  1 VIEW COMPARISON:  07/25/2017 FINDINGS: Prior CABG. Scarring in the lingula. Heart is borderline in size. Right lung clear. No effusions. No acute bony abnormality. IMPRESSION: Prior CABG.  Lingular scarring.  No active disease. Electronically Signed   By: Rolm Baptise M.D.   On: 08/27/2018 22:30   Dg Hip Unilat With Pelvis 2-3 Views Left  Result Date: 08/27/2018 CLINICAL DATA:  Fall, hip pain EXAM: DG HIP (WITH OR WITHOUT PELVIS) 2-3V LEFT COMPARISON:  None. FINDINGS: There is a left femoral intertrochanteric fracture. This is nondisplaced. No angulation. No subluxation or dislocation. IMPRESSION: No displaced left femoral intertrochanteric fracture. Electronically Signed   By: Rolm Baptise M.D.   On: 08/27/2018 22:31    EKG: Independently  reviewed. Sinus rhythm with RBBB.  Not significantly changed compared to prior.  Assessment/Plan Principal Problem:   Intertrochanteric fracture of left femur, closed, initial encounter ([place]) Active Problems:   Insulin dependent diabetes mellitus with complications ([place])   Hyperlipidemia associated with type 2 diabetes mellitus ([place])   Hypertension associated with diabetes ([place])   Hx of CABG   GERD   CLL (chronic lymphocytic leukemia) ([place])   Acute kidney injury superimposed on chronic kidney disease ([facility])   CAD (coronary artery disease)  [name] is a 78 y.o. female with medical history significant for CLL, CAD with hx of MI s/p CABG x 5 in 1997, insulin-dependent type 2 diabetes, hypertension, hyperlipidemia, CKD stage III, iron deficiency anemia, and GERD who is admitted with a left intertrochanteric fracture of the left femur.   Intertrochanteric fracture of the left femur: Occurred after a mechanical fall.  She has a 15.0% preoperative 30-day risk of death, MI, or cardiac arrest based on the revised cardiac risk index due to history of ischemic heart disease, insulin treatment, and acute kidney injury with creatinine greater than 2. -Orthopedics consulted, plan for potential surgical intervention on 08/28/2018 -Hold home aspirin, Plavix, pharmacologic VTE prophylaxis -Continue IV fluid resuscitation for likely prerenal kidney injury -Continue Tylenol, OxyIR, and IV Dilaudid as needed for pain -Will keep n.p.o.  Acute kidney injury superimposed on CKD stage III: This is likely prerenal in the setting of GI volume loss from diarrhea day prior to admission in addition to likely dehydration after working outside for 6 hours plus use of ARB. -Continue IV fluids overnight and repeat labs in a.m. -Hold home irbesartan and metformin  CAD s/p CABG: Denies any chest pain.  EKG unchanged from prior and without acute ischemic changes. -Holding home aspirin and Plavix -Continue  Imdur and atorvastatin  CLL: With chronic leukocytosis that is increased from recent baseline on admission.  Likely reactive from acute fracture.  She follows with oncology, Dr. Marin Olp, and has a follow-up appointment on 09/06/2018.  Continue to monitor.  Insulin-dependent type 2 diabetes: A1c 6.2 on 05/29/2018.  Uses Novolin N 38 units a.m. and 20 units p.m., Novolin R 14 units twice daily before meals, metformin, and Victoza. -Reduce home Novolin N to 20 units a.m. and 10 units p.m., use sensitive SSI q4h while n.p.o. and hold home metformin and Victoza  Hypertension: BP is soft on admission.  Holding home atenolol, amlodipine, and irbesartan.  Can resume atenolol and amlodipine as needed.  Continue IV fluids as above.  Hyperlipidemia: Continue atorvastatin.  GERD: Continue PPI.  DVT prophylaxis: SCDs Code Status: Partial code, she does not want to be intubated in a code situation otherwise agrees to full scope of care Family Communication: Discussed with patient, she has discussed with son Disposition Plan: Pending surgical intervention, postop recovery, and PT/OT eval Consults called: Orthopedics Admission status: Inpatient for management of left femoral fracture.   Zada Finders MD [facility]  If  7PM-7AM, please contact night-coverage [email or web address]  08/28/2018, 12:22 AM

## 2018-08-27 NOTE — ED Triage Notes (Signed)
Pt reports getting out of her car and tripped. Pt denies hitting head. Pt reports pain in left hip. Faint pulses present. Pt also has abrasions to left elbow.   134/60 HR 70 RR 18 95% RA CBG 116  18 RAC 100 mcg Fentanyl  4 mg Zofran 250 mL

## 2018-08-28 ENCOUNTER — Encounter (HOSPITAL_COMMUNITY): Payer: Self-pay | Admitting: *Deleted

## 2018-08-28 ENCOUNTER — Inpatient Hospital Stay (HOSPITAL_COMMUNITY): Payer: Medicare Other | Admitting: Certified Registered Nurse Anesthetist

## 2018-08-28 ENCOUNTER — Inpatient Hospital Stay (HOSPITAL_COMMUNITY): Payer: Medicare Other

## 2018-08-28 ENCOUNTER — Other Ambulatory Visit: Payer: Self-pay

## 2018-08-28 ENCOUNTER — Encounter (HOSPITAL_COMMUNITY)
Admission: EM | Disposition: A | Discharge status: Skilled Nursing Facility | Payer: Self-pay | Source: Home / Self Care | Attending: Internal Medicine

## 2018-08-28 HISTORY — PX: FEMUR IM NAIL: SHX1597

## 2018-08-28 LAB — POCT I-STAT 4, (NA,K, GLUC, HGB,HCT)
Glucose, Bld: 120 mg/dL — ABNORMAL HIGH (ref 70–99)
HCT: 31 % — ABNORMAL LOW (ref 36.0–46.0)
Hemoglobin: 10.5 g/dL — ABNORMAL LOW (ref 12.0–15.0)
Potassium: 4.2 mmol/L (ref 3.5–5.1)
Sodium: 142 mmol/L (ref 135–145)

## 2018-08-28 LAB — TYPE AND SCREEN
ABO/RH(D): O POS
Antibody Screen: NEGATIVE

## 2018-08-28 LAB — CBC
HCT: 32.4 % — ABNORMAL LOW (ref 36.0–46.0)
Hemoglobin: 9.9 g/dL — ABNORMAL LOW (ref 12.0–15.0)
MCH: 30.8 pg (ref 26.0–34.0)
MCHC: 30.6 g/dL (ref 30.0–36.0)
MCV: 100.9 fL — ABNORMAL HIGH (ref 80.0–100.0)
Platelets: 167 10*3/uL (ref 150–400)
RBC: 3.21 MIL/uL — ABNORMAL LOW (ref 3.87–5.11)
RDW: 13.5 % (ref 11.5–15.5)
WBC: 60.1 10*3/uL (ref 4.0–10.5)
nRBC: 0 % (ref 0.0–0.2)

## 2018-08-28 LAB — BASIC METABOLIC PANEL
Anion gap: 8 (ref 5–15)
BUN: 29 mg/dL — ABNORMAL HIGH (ref 8–23)
CO2: 21 mmol/L — ABNORMAL LOW (ref 22–32)
Calcium: 8.8 mg/dL — ABNORMAL LOW (ref 8.9–10.3)
Chloride: 111 mmol/L (ref 98–111)
Creatinine, Ser: 1.74 mg/dL — ABNORMAL HIGH (ref 0.44–1.00)
GFR calc Af Amer: 32 mL/min — ABNORMAL LOW (ref >60)
GFR calc non Af Amer: 28 mL/min — ABNORMAL LOW (ref >60)
Glucose, Bld: 112 mg/dL — ABNORMAL HIGH (ref 70–99)
Potassium: 5.7 mmol/L — ABNORMAL HIGH (ref 3.5–5.1)
Sodium: 140 mmol/L (ref 135–145)

## 2018-08-28 LAB — GLUCOSE, CAPILLARY
Glucose-Capillary: 101 mg/dL — ABNORMAL HIGH (ref 70–99)
Glucose-Capillary: 103 mg/dL — ABNORMAL HIGH (ref 70–99)
Glucose-Capillary: 102 mg/dL — ABNORMAL HIGH (ref 70–99)
Glucose-Capillary: 117 mg/dL — ABNORMAL HIGH (ref 70–99)
Glucose-Capillary: 182 mg/dL — ABNORMAL HIGH (ref 70–99)
Glucose-Capillary: 232 mg/dL — ABNORMAL HIGH (ref 70–99)

## 2018-08-28 LAB — MRSA PCR SCREENING: MRSA by PCR: POSITIVE — AB

## 2018-08-28 LAB — SARS CORONAVIRUS 2 (TAT 6-24 HRS): SARS Coronavirus 2: NEGATIVE

## 2018-08-28 LAB — ABO/RH: ABO/RH(D): O POS

## 2018-08-28 LAB — HEMOGLOBIN A1C
Hgb A1c MFr Bld: 6.1 % — ABNORMAL HIGH (ref 4.8–5.6)
Mean Plasma Glucose: 128.37 mg/dL

## 2018-08-28 SURGERY — INSERTION, INTRAMEDULLARY ROD, FEMUR
Anesthesia: General | Site: Hip | Laterality: Left

## 2018-08-28 MED ORDER — METOCLOPRAMIDE HCL 5 MG/ML IJ SOLN: 5.0000 mg | Freq: Three times a day (TID) | INTRAMUSCULAR | Status: DC | PRN

## 2018-08-28 MED ORDER — CEFAZOLIN SODIUM-DEXTROSE 2-4 GM/100ML-% IV SOLN
2.0000 g | INTRAVENOUS | Status: AC
  Administered 2018-08-28: 2 g via INTRAVENOUS
  Filled 2018-08-28: qty 100

## 2018-08-28 MED ORDER — ONDANSETRON HCL 4 MG/2ML IJ SOLN
4.0000 mg | Freq: Four times a day (QID) | INTRAMUSCULAR | Status: DC | PRN
  Administered 2018-08-29 – 2018-08-30 (×3): 4 mg via INTRAVENOUS
  Filled 2018-08-28 (×3): qty 2

## 2018-08-28 MED ORDER — ROCURONIUM BROMIDE 10 MG/ML (PF) SYRINGE
PREFILLED_SYRINGE | INTRAVENOUS | Status: AC
  Filled 2018-08-28: qty 10

## 2018-08-28 MED ORDER — HYDROMORPHONE HCL 1 MG/ML IJ SOLN
0.5000 mg | INTRAMUSCULAR | Status: DC | PRN
  Administered 2018-08-28 – 2018-08-30 (×8): 0.5 mg via INTRAVENOUS
  Filled 2018-08-28 (×9): qty 0.5

## 2018-08-28 MED ORDER — CLOPIDOGREL BISULFATE 75 MG PO TABS
75.0000 mg | ORAL_TABLET | Freq: Every day | ORAL | Status: DC
  Administered 2018-08-29 – 2018-08-31 (×3): 75 mg via ORAL
  Filled 2018-08-28 (×3): qty 1

## 2018-08-28 MED ORDER — PROPOFOL 10 MG/ML IV BOLUS
INTRAVENOUS | Status: AC
  Filled 2018-08-28: qty 20

## 2018-08-28 MED ORDER — DEXAMETHASONE SODIUM PHOSPHATE 4 MG/ML IJ SOLN
INTRAMUSCULAR | Status: DC | PRN
  Administered 2018-08-28: 5 mg via INTRAVENOUS

## 2018-08-28 MED ORDER — INSULIN NPH (HUMAN) (ISOPHANE) 100 UNIT/ML ~~LOC~~ SUSP
20.0000 [IU] | Freq: Every day | SUBCUTANEOUS | Status: DC
  Filled 2018-08-28: qty 10

## 2018-08-28 MED ORDER — INSULIN NPH (HUMAN) (ISOPHANE) 100 UNIT/ML ~~LOC~~ SUSP
10.0000 [IU] | Freq: Every day | SUBCUTANEOUS | Status: DC
  Filled 2018-08-28: qty 10

## 2018-08-28 MED ORDER — SODIUM CHLORIDE 0.9 % IV SOLN
INTRAVENOUS | Status: DC
  Administered 2018-08-28 – 2018-08-31 (×9): via INTRAVENOUS

## 2018-08-28 MED ORDER — ISOSORBIDE MONONITRATE ER 30 MG PO TB24
30.0000 mg | ORAL_TABLET | Freq: Every day | ORAL | Status: DC
  Administered 2018-08-29 – 2018-08-31 (×3): 30 mg via ORAL
  Filled 2018-08-28 (×3): qty 1

## 2018-08-28 MED ORDER — SENNOSIDES-DOCUSATE SODIUM 8.6-50 MG PO TABS: 1.0000 | ORAL_TABLET | Freq: Every evening | ORAL | Status: DC | PRN

## 2018-08-28 MED ORDER — SENNA 8.6 MG PO TABS
1.0000 | ORAL_TABLET | Freq: Two times a day (BID) | ORAL | Status: DC
  Administered 2018-08-29 – 2018-08-31 (×5): 8.6 mg via ORAL
  Filled 2018-08-28 (×6): qty 1

## 2018-08-28 MED ORDER — ONDANSETRON HCL 4 MG/2ML IJ SOLN
INTRAMUSCULAR | Status: AC
  Filled 2018-08-28: qty 2

## 2018-08-28 MED ORDER — HYDROMORPHONE HCL 1 MG/ML IJ SOLN
0.2000 mg | Freq: Once | INTRAMUSCULAR | Status: AC
  Administered 2018-08-28: 04:00:00 0.2 mg via INTRAVENOUS
  Filled 2018-08-28: qty 0.5

## 2018-08-28 MED ORDER — ISOPROPYL ALCOHOL 70 % SOLN
Status: DC | PRN
  Administered 2018-08-28: 1 via TOPICAL

## 2018-08-28 MED ORDER — ONDANSETRON HCL 4 MG PO TABS: 4.0000 mg | ORAL_TABLET | Freq: Four times a day (QID) | ORAL | Status: DC | PRN

## 2018-08-28 MED ORDER — ONDANSETRON HCL 4 MG/2ML IJ SOLN
INTRAMUSCULAR | Status: DC | PRN
  Administered 2018-08-28: 4 mg via INTRAVENOUS

## 2018-08-28 MED ORDER — HYDROMORPHONE HCL 1 MG/ML IJ SOLN
INTRAMUSCULAR | Status: AC
  Filled 2018-08-28: qty 1

## 2018-08-28 MED ORDER — FENTANYL CITRATE (PF) 100 MCG/2ML IJ SOLN
INTRAMUSCULAR | Status: AC
  Filled 2018-08-28: qty 2

## 2018-08-28 MED ORDER — SUGAMMADEX SODIUM 200 MG/2ML IV SOLN
INTRAVENOUS | Status: DC | PRN
  Administered 2018-08-28: 200 mg via INTRAVENOUS

## 2018-08-28 MED ORDER — CHLORHEXIDINE GLUCONATE CLOTH 2 % EX PADS
6.0000 | MEDICATED_PAD | Freq: Every day | CUTANEOUS | Status: DC
  Administered 2018-08-28: 6 via TOPICAL

## 2018-08-28 MED ORDER — PHENYLEPHRINE 40 MCG/ML (10ML) SYRINGE FOR IV PUSH (FOR BLOOD PRESSURE SUPPORT)
PREFILLED_SYRINGE | INTRAVENOUS | Status: DC | PRN
  Administered 2018-08-28 (×3): 80 ug via INTRAVENOUS

## 2018-08-28 MED ORDER — SODIUM CHLORIDE 0.9 % IV SOLN
INTRAVENOUS | Status: DC
  Administered 2018-08-28: 11:00:00 via INTRAVENOUS

## 2018-08-28 MED ORDER — ONDANSETRON HCL 4 MG/2ML IJ SOLN
4.0000 mg | Freq: Four times a day (QID) | INTRAMUSCULAR | Status: DC | PRN
  Administered 2018-08-28: 03:00:00 4 mg via INTRAVENOUS
  Filled 2018-08-28: qty 2

## 2018-08-28 MED ORDER — DEXAMETHASONE SODIUM PHOSPHATE 10 MG/ML IJ SOLN
INTRAMUSCULAR | Status: AC
  Filled 2018-08-28: qty 1

## 2018-08-28 MED ORDER — FENTANYL CITRATE (PF) 100 MCG/2ML IJ SOLN
INTRAMUSCULAR | Status: DC | PRN
  Administered 2018-08-28 (×4): 50 ug via INTRAVENOUS

## 2018-08-28 MED ORDER — MUPIROCIN 2 % EX OINT
1.0000 "application " | TOPICAL_OINTMENT | Freq: Two times a day (BID) | CUTANEOUS | Status: DC
  Administered 2018-08-28 – 2018-08-31 (×7): 1 via NASAL
  Filled 2018-08-28: qty 22

## 2018-08-28 MED ORDER — HYDROMORPHONE HCL 1 MG/ML IJ SOLN
0.2500 mg | INTRAMUSCULAR | Status: DC | PRN
  Administered 2018-08-28 (×3): 0.5 mg via INTRAVENOUS

## 2018-08-28 MED ORDER — PHENOL 1.4 % MT LIQD: 1.0000 | OROMUCOSAL | Status: DC | PRN

## 2018-08-28 MED ORDER — ASPIRIN 81 MG PO CHEW
81.0000 mg | CHEWABLE_TABLET | Freq: Two times a day (BID) | ORAL | Status: DC
  Administered 2018-08-28 – 2018-08-31 (×7): 81 mg via ORAL
  Filled 2018-08-28 (×7): qty 1

## 2018-08-28 MED ORDER — METOCLOPRAMIDE HCL 5 MG PO TABS: 5.0000 mg | ORAL_TABLET | Freq: Three times a day (TID) | ORAL | Status: DC | PRN

## 2018-08-28 MED ORDER — SODIUM CHLORIDE 0.9 % IV SOLN: INTRAVENOUS | Status: DC

## 2018-08-28 MED ORDER — PROPOFOL 10 MG/ML IV BOLUS
INTRAVENOUS | Status: DC | PRN
  Administered 2018-08-28: 120 mg via INTRAVENOUS

## 2018-08-28 MED ORDER — MENTHOL 3 MG MT LOZG
1.0000 | LOZENGE | OROMUCOSAL | Status: DC | PRN
  Filled 2018-08-28: qty 9

## 2018-08-28 MED ORDER — SODIUM ZIRCONIUM CYCLOSILICATE 10 G PO PACK
10.0000 g | PACK | Freq: Once | ORAL | Status: AC
  Administered 2018-08-28: 09:00:00 10 g via ORAL
  Filled 2018-08-28: qty 1

## 2018-08-28 MED ORDER — EPHEDRINE 5 MG/ML INJ
INTRAVENOUS | Status: AC
  Filled 2018-08-28: qty 10

## 2018-08-28 MED ORDER — ACETAMINOPHEN 325 MG PO TABS: 650.0000 mg | ORAL_TABLET | Freq: Four times a day (QID) | ORAL | Status: DC | PRN

## 2018-08-28 MED ORDER — PHENYLEPHRINE 40 MCG/ML (10ML) SYRINGE FOR IV PUSH (FOR BLOOD PRESSURE SUPPORT)
PREFILLED_SYRINGE | INTRAVENOUS | Status: AC
  Filled 2018-08-28: qty 10

## 2018-08-28 MED ORDER — ENSURE PRE-SURGERY PO LIQD
296.0000 mL | Freq: Once | ORAL | Status: DC
  Filled 2018-08-28: qty 296

## 2018-08-28 MED ORDER — PANTOPRAZOLE SODIUM 40 MG PO TBEC
40.0000 mg | DELAYED_RELEASE_TABLET | Freq: Every day | ORAL | Status: DC
  Administered 2018-08-28 – 2018-08-31 (×4): 40 mg via ORAL
  Filled 2018-08-28 (×4): qty 1

## 2018-08-28 MED ORDER — LIDOCAINE 2% (20 MG/ML) 5 ML SYRINGE
INTRAMUSCULAR | Status: AC
  Filled 2018-08-28: qty 5

## 2018-08-28 MED ORDER — CHLORHEXIDINE GLUCONATE 4 % EX LIQD: 60.0000 mL | Freq: Once | CUTANEOUS | Status: DC

## 2018-08-28 MED ORDER — VANCOMYCIN HCL IN DEXTROSE 1-5 GM/200ML-% IV SOLN
1000.0000 mg | Freq: Once | INTRAVENOUS | Status: AC
  Administered 2018-08-28: 11:00:00 1000 mg via INTRAVENOUS
  Filled 2018-08-28: qty 200

## 2018-08-28 MED ORDER — EPHEDRINE SULFATE-NACL 50-0.9 MG/10ML-% IV SOSY
PREFILLED_SYRINGE | INTRAVENOUS | Status: DC | PRN
  Administered 2018-08-28 (×3): 10 mg via INTRAVENOUS

## 2018-08-28 MED ORDER — POVIDONE-IODINE 10 % EX SWAB
2.0000 "application " | Freq: Once | CUTANEOUS | Status: AC
  Administered 2018-08-28: 2 via TOPICAL

## 2018-08-28 MED ORDER — VANCOMYCIN HCL IN DEXTROSE 1-5 GM/200ML-% IV SOLN
1000.0000 mg | Freq: Two times a day (BID) | INTRAVENOUS | Status: AC
  Administered 2018-08-28: 22:00:00 1000 mg via INTRAVENOUS
  Filled 2018-08-28: qty 200

## 2018-08-28 MED ORDER — SODIUM CHLORIDE 0.9 % IV SOLN
INTRAVENOUS | Status: AC
  Administered 2018-08-28 (×2): via INTRAVENOUS

## 2018-08-28 MED ORDER — SODIUM CHLORIDE 0.9 % IR SOLN
Status: DC | PRN
  Administered 2018-08-28: 1000 mL

## 2018-08-28 MED ORDER — LIDOCAINE 2% (20 MG/ML) 5 ML SYRINGE
INTRAMUSCULAR | Status: DC | PRN
  Administered 2018-08-28: 60 mg via INTRAVENOUS

## 2018-08-28 MED ORDER — CLORAZEPATE DIPOTASSIUM 3.75 MG PO TABS: 7.5000 mg | ORAL_TABLET | Freq: Every evening | ORAL | Status: DC | PRN

## 2018-08-28 MED ORDER — TRANEXAMIC ACID-NACL 1000-0.7 MG/100ML-% IV SOLN
1000.0000 mg | INTRAVENOUS | Status: AC
  Administered 2018-08-28: 12:00:00 1000 mg via INTRAVENOUS
  Filled 2018-08-28: qty 100

## 2018-08-28 MED ORDER — DOCUSATE SODIUM 100 MG PO CAPS
100.0000 mg | ORAL_CAPSULE | Freq: Two times a day (BID) | ORAL | Status: DC
  Administered 2018-08-29 – 2018-08-31 (×5): 100 mg via ORAL
  Filled 2018-08-28 (×6): qty 1

## 2018-08-28 MED ORDER — LACTATED RINGERS IV SOLN
INTRAVENOUS | Status: DC | PRN
  Administered 2018-08-28: 11:00:00 via INTRAVENOUS

## 2018-08-28 MED ORDER — ATORVASTATIN CALCIUM 20 MG PO TABS
20.0000 mg | ORAL_TABLET | Freq: Every day | ORAL | Status: DC
  Administered 2018-08-29 – 2018-08-31 (×3): 20 mg via ORAL
  Filled 2018-08-28 (×3): qty 1

## 2018-08-28 MED ORDER — OXYCODONE HCL 5 MG PO TABS
5.0000 mg | ORAL_TABLET | ORAL | Status: DC | PRN
  Administered 2018-08-28: 09:00:00 5 mg via ORAL
  Administered 2018-08-28: 10 mg via ORAL
  Administered 2018-08-28: 18:00:00 5 mg via ORAL
  Administered 2018-08-29: 10 mg via ORAL
  Filled 2018-08-28: qty 1
  Filled 2018-08-28 (×2): qty 2
  Filled 2018-08-28: qty 1

## 2018-08-28 MED ORDER — INSULIN ASPART 100 UNIT/ML ~~LOC~~ SOLN
0.0000 [IU] | SUBCUTANEOUS | Status: DC
  Administered 2018-08-28: 2 [IU] via SUBCUTANEOUS
  Administered 2018-08-28: 20:00:00 3 [IU] via SUBCUTANEOUS
  Administered 2018-08-29: 5 [IU] via SUBCUTANEOUS
  Administered 2018-08-29 (×3): 2 [IU] via SUBCUTANEOUS
  Filled 2018-08-28: qty 0.09

## 2018-08-28 MED ORDER — ATENOLOL 50 MG PO TABS
100.0000 mg | ORAL_TABLET | Freq: Every day | ORAL | Status: DC
  Administered 2018-08-28 – 2018-08-31 (×4): 100 mg via ORAL
  Filled 2018-08-28 (×4): qty 2

## 2018-08-28 MED ORDER — ROCURONIUM BROMIDE 10 MG/ML (PF) SYRINGE
PREFILLED_SYRINGE | INTRAVENOUS | Status: DC | PRN
  Administered 2018-08-28: 50 mg via INTRAVENOUS

## 2018-08-28 MED ORDER — PROMETHAZINE HCL 25 MG/ML IJ SOLN: 6.2500 mg | INTRAMUSCULAR | Status: DC | PRN

## 2018-08-28 SURGICAL SUPPLY — 40 items
BAG ZIPLOCK 12X15 (MISCELLANEOUS) IMPLANT
BIT DRILL AO GAMMA 4.2X340 (BIT) ×2 IMPLANT
CHLORAPREP W/TINT 26 (MISCELLANEOUS) ×2 IMPLANT
COVER PERINEAL POST (MISCELLANEOUS) ×2 IMPLANT
COVER SURGICAL LIGHT HANDLE (MISCELLANEOUS) ×2 IMPLANT
COVER WAND RF STERILE (DRAPES) IMPLANT
DERMABOND ADVANCED (GAUZE/BANDAGES/DRESSINGS) ×1
DERMABOND ADVANCED .7 DNX12 (GAUZE/BANDAGES/DRESSINGS) ×1 IMPLANT
DRAPE C-ARM 42X120 X-RAY (DRAPES) ×2 IMPLANT
DRAPE C-ARMOR (DRAPES) ×2 IMPLANT
DRAPE SHEET LG 3/4 BI-LAMINATE (DRAPES) ×2 IMPLANT
DRAPE STERI IOBAN 125X83 (DRAPES) ×2 IMPLANT
DRAPE U-SHAPE 47X51 STRL (DRAPES) ×4 IMPLANT
DRSG MEPILEX BORDER 4X4 (GAUZE/BANDAGES/DRESSINGS) ×2 IMPLANT
ELECT BLADE TIP CTD 4 INCH (ELECTRODE) IMPLANT
FACESHIELD WRAPAROUND (MASK) ×4 IMPLANT
GAUZE SPONGE 4X4 12PLY STRL (GAUZE/BANDAGES/DRESSINGS) ×2 IMPLANT
GLOVE BIO SURGEON STRL SZ8.5 (GLOVE) ×4 IMPLANT
GLOVE BIOGEL PI IND STRL 8.5 (GLOVE) ×1 IMPLANT
GLOVE BIOGEL PI INDICATOR 8.5 (GLOVE) ×1
GOWN SPEC L3 XXLG W/TWL (GOWN DISPOSABLE) ×2 IMPLANT
K-WIRE  3.2X450M STR (WIRE) ×1
K-WIRE 3.2X450M STR (WIRE) ×1
KIT BASIN OR (CUSTOM PROCEDURE TRAY) ×2 IMPLANT
KIT TURNOVER KIT A (KITS) IMPLANT
KWIRE 3.2X450M STR (WIRE) ×1 IMPLANT
MANIFOLD NEPTUNE II (INSTRUMENTS) ×2 IMPLANT
MARKER SKIN DUAL TIP RULER LAB (MISCELLANEOUS) ×2 IMPLANT
NAIL TROCH GAMMA 11X18 (Nail) ×2 IMPLANT
PACK TOTAL JOINT (CUSTOM PROCEDURE TRAY) ×2 IMPLANT
REAMER SHAFT BIXCUT (INSTRUMENTS) ×2 IMPLANT
SCREW LAG GAMMA 3 TI 10.5X105M (Screw) ×2 IMPLANT
SCREW LOCKING T2 F/T  5MMX40MM (Screw) ×1 IMPLANT
SCREW LOCKING T2 F/T 5MMX40MM (Screw) ×1 IMPLANT
SUT MNCRL AB 3-0 PS2 18 (SUTURE) IMPLANT
SUT MON AB 2-0 CT1 36 (SUTURE) ×2 IMPLANT
SUT VIC AB 1 CT1 36 (SUTURE) ×2 IMPLANT
TOWEL OR 17X26 10 PK STRL BLUE (TOWEL DISPOSABLE) ×2 IMPLANT
TOWEL OR NON WOVEN STRL DISP B (DISPOSABLE) ×2 IMPLANT
YANKAUER SUCT BULB TIP NO VENT (SUCTIONS) ×2 IMPLANT

## 2018-08-28 NOTE — Progress Notes (Signed)
PROGRESS NOTE    [name]  [id number] DOB: Mar 14, 1940 DOA: 08/27/2018 PCP: Colon Branch, MD     Brief Narrative:  From H&P by Dr. Posey Pronto: [name] is a 78 y.o. 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 Patient states she was in her usual state of health today.  She was outside working in the yard from 2 PM to 8 PM.  After finishing up she was backing her car up.  She walked out of her car was pouring a cup of tea out when she stumbled outside and fell onto the ground with her hip landing on the cement.  She has significant pain and was unable to stand on her own power or move her left lower extremity much. She denies any injury to her head or loss of consciousness. Patient does report about 4-5 episodes of diarrhea day prior to admission which occurred after eating a salmon patty and pinto beans.  She says she has not had any further episodes. In the ED, work up revealed nondisplaced left femoral intertrochanteric fracture. Orthopedic surgery consulted.   New events last 24 hours / Subjective: Complains of severe left hip pain.  Otherwise feeling okay.  Denies any chest pain or shortness of breath, nausea or vomiting.  No further episodes of diarrhea.  Assessment & Plan:   Principal Problem:   Intertrochanteric fracture of left femur, closed, initial encounter ([place]) Active Problems:   Insulin dependent diabetes mellitus with complications ([place])   Hyperlipidemia associated with type 2 diabetes mellitus ([place])   Hypertension associated with diabetes ([place])   Hx of CABG   GERD   CLL (chronic lymphocytic leukemia) ([place])   Acute kidney injury superimposed on chronic kidney disease (HCC)   CAD (coronary artery disease)   Left hip fracture -After  mechanical fall -Orthopedic surgery consulted, planning for ORIF 8/26  AKI on CKD stage III -Likely prerenal in setting of GI volume loss -Improved overnight -Continue IV fluids -Hold irbesartan, metformin  Mild hyperkalemia -Lokelma x1 -Resolved   CAD status post CABG -Resume aspirin, Plavix postoperatively.  Continue Imdur, atorvastatin  CLL -Follows with Dr. Marin Olp, oncology.  She has a follow-up appointment on 9/4  Insulin-dependent type 2 diabetes, well controlled -Hemoglobin A1c 6.2 -Holding insulin as patient remains n.p.o. with well-controlled blood sugars  Essential hypertension -Continue atenolol.  Hold amlodipine, irbesartan  Hyperlipidemia -Continue atorvastatin         DVT prophylaxis: SCD Code Status: DNI Family Communication: None Disposition Plan: Pending postoperative care   Consultants:   Orthopedic surgery  Procedures:   None  Antimicrobials:  Anti-infectives (From admission, onward)   Start     Dose/Rate Route Frequency Ordered Stop   08/28/18 1045  vancomycin (VANCOCIN) IVPB 1000 mg/200 mL premix     1,000 mg 200 mL/hr over 60 Minutes Intravenous  Once 08/28/18 1038 08/28/18 1151   08/28/18 1030  ceFAZolin (ANCEF) IVPB 2g/100 mL premix     2 g 200 mL/hr over 30 Minutes Intravenous On call to O.R. 08/28/18 1029 08/28/18 1205        Objective: Vitals:   08/28/18 0212 08/28/18 0627 08/28/18 1015 08/28/18 1030  BP: 136/82 135/61  (!) 154/62  Pulse: 82 75  76  Resp: 19 18  16  Temp: 98.3 F (36.8 C) 98.3 F (36.8 C)  98.5 F (36.9 C)  TempSrc: Oral Oral  Oral  SpO2: 97% 96%  94%  Weight:   80.7 kg   Height:   5\' 6"  (1.676 m)     Intake/Output Summary (Last 24 hours) at 08/28/2018 1258 Last data filed at 08/28/2018 1241 Gross per 24 hour  Intake 2116.64 ml  Output 50 ml  Net 2066.64 ml   Filed Weights   08/28/18 0207 08/28/18 1015  Weight: 80.7 kg 80.7 kg    Examination:  General exam: Appears calm and  comfortable  Respiratory system: Clear to auscultation. Respiratory effort normal. No respiratory distress. No conversational dyspnea.  Cardiovascular system: S1 & S2 heard, RRR. No murmurs. No pedal edema. Gastrointestinal system: Abdomen is nondistended, soft and nontender. Normal bowel sounds heard. Central nervous system: Alert and oriented. No focal neurological deficits. Speech clear.  Extremities: Symmetric in appearance  Skin: No rashes, lesions or ulcers on exposed skin  Psychiatry: Judgement and insight appear normal. Mood & affect appropriate.   Data Reviewed: I have personally reviewed following labs and imaging studies  CBC: Recent Labs  Lab 08/27/18 2210 08/28/18 0526 08/28/18 1048  WBC 58.3* 60.1*  --   NEUTROABS 27.4*  --   --   HGB 10.9* 9.9* 10.5*  HCT 34.9* 32.4* 31.0*  MCV 98.6 100.9*  --   PLT 178 167  --    Basic Metabolic Panel: Recent Labs  Lab 08/27/18 2210 08/28/18 0526 08/28/18 1048  NA 142 140 142  K 4.4 5.7* 4.2  CL 110 111  --   CO2 23 21*  --   GLUCOSE 94 112* 120*  BUN 32* 29*  --   CREATININE 2.38* 1.74*  --   CALCIUM 9.5 8.8*  --    GFR: Estimated Creatinine Clearance: 28.6 mL/min (A) (by C-G formula based on SCr of 1.74 mg/dL (H)). Liver Function Tests: No results for input(s): AST, ALT, ALKPHOS, BILITOT, PROT, ALBUMIN in the last 168 hours. No results for input(s): LIPASE, AMYLASE in the last 168 hours. No results for input(s): AMMONIA in the last 168 hours. Coagulation Profile: Recent Labs  Lab 08/27/18 2210  INR 1.1   Cardiac Enzymes: No results for input(s): CKTOTAL, CKMB, CKMBINDEX, TROPONINI in the last 168 hours. BNP (last 3 results) No results for input(s): PROBNP in the last 8760 hours. HbA1C: Recent Labs    08/27/18 2210  HGBA1C 6.1*   CBG: Recent Labs  Lab 08/28/18 0212 08/28/18 0432 08/28/18 0805 08/28/18 1028  GLUCAP 101* 103* 102* 117*   Lipid Profile: No results for input(s): CHOL, HDL, LDLCALC,  TRIG, CHOLHDL, LDLDIRECT in the last 72 hours. Thyroid Function Tests: No results for input(s): TSH, T4TOTAL, FREET4, T3FREE, THYROIDAB in the last 72 hours. Anemia Panel: No results for input(s): VITAMINB12, FOLATE, FERRITIN, TIBC, IRON, RETICCTPCT in the last 72 hours. Sepsis Labs: No results for input(s): PROCALCITON, LATICACIDVEN in the last 168 hours.  Recent Results (from the past 240 hour(s))  SARS CORONAVIRUS 2 (TAT 6-12 HRS)     Status: None   Collection Time: 08/27/18 10:42 PM  Result Value Ref Range Status   SARS Coronavirus 2 NEGATIVE NEGATIVE Final    Comment: (NOTE) SARS-CoV-2 target nucleic acids are NOT DETECTED. The SARS-CoV-2 RNA is generally detectable in upper and lower respiratory specimens during the acute phase of infection. Negative results do not preclude SARS-CoV-2 infection, do not rule out co-infections with other pathogens, and  should not be used as the sole basis for treatment or other patient management decisions. Negative results must be combined with clinical observations, patient history, and epidemiological information. The expected result is Negative. Fact Sheet for Patients: SugarRoll.be Fact Sheet for Healthcare Providers: https://www.woods-mathews.com/ This test is not yet approved or cleared by the [place] FDA and  has been authorized for detection and/or diagnosis of SARS-CoV-2 by FDA under an Emergency Use Authorization (EUA). This EUA will remain  in effect (meaning this test can be used) for the duration of the COVID-19 declaration under Section 56 4(b)(1) of the Act, 21 U.S.C. section 360bbb-3(b)(1), unless the authorization is terminated or revoked sooner. Performed at [facility], [place] [address], [place], [place] [address]   MRSA PCR Screening     Status: Abnormal   Collection Time: 08/28/18  2:33 AM   Specimen: Nasal Mucosa; Nasopharyngeal  Result Value Ref Range Status    MRSA by PCR POSITIVE (A) NEGATIVE Final    Comment:        The GeneXpert MRSA Assay (FDA approved for NASAL specimens only), is one component of a comprehensive MRSA colonization surveillance program. It is not intended to diagnose MRSA infection nor to guide or monitor treatment for MRSA infections. RESULT CALLED TO, READ BACK BY AND VERIFIED WITH: CARTER,K @ [id number] ON [id number] BY POTEAT,S Performed at [facility] [address], [place], [place] [address]       Radiology Studies: Dg Chest 1 View  Result Date: 08/27/2018 CLINICAL DATA:  Hip fracture.  Fall. EXAM: CHEST  1 VIEW COMPARISON:  07/25/2017 FINDINGS: Prior CABG. Scarring in the lingula. Heart is borderline in size. Right lung clear. No effusions. No acute bony abnormality. IMPRESSION: Prior CABG.  Lingular scarring.  No active disease. Electronically Signed   By: Rolm Baptise M.D.   On: 08/27/2018 22:30   Dg C-arm 1-60 Min-no Report  Result Date: 08/28/2018 Fluoroscopy was utilized by the requesting physician.  No radiographic interpretation.   Dg Hip Unilat With Pelvis 2-3 Views Left  Result Date: 08/27/2018 CLINICAL DATA:  Fall, hip pain EXAM: DG HIP (WITH OR WITHOUT PELVIS) 2-3V LEFT COMPARISON:  None. FINDINGS: There is a left femoral intertrochanteric fracture. This is nondisplaced. No angulation. No subluxation or dislocation. IMPRESSION: No displaced left femoral intertrochanteric fracture. Electronically Signed   By: Rolm Baptise M.D.   On: 08/27/2018 22:31      Scheduled Meds: . [MAR Hold] atenolol  100 mg Oral Daily  . [MAR Hold] atorvastatin  20 mg Oral Daily  . chlorhexidine  60 mL Topical Once  . [MAR Hold] Chlorhexidine Gluconate Cloth  6 each Topical Q0600  . feeding supplement  296 mL Oral Once  . [MAR Hold] insulin aspart  0-9 Units Subcutaneous Q4H  . [MAR Hold] isosorbide mononitrate  30 mg Oral Daily  . [MAR Hold] mupirocin ointment  1 application Nasal BID  . [MAR Hold]  pantoprazole  40 mg Oral Daily   Continuous Infusions: . sodium chloride 50 mL/hr at 08/28/18 1049     LOS: 1 day      Time spent: 40 minutes   Dessa Phi, DO [facility] [email or web address] 08/28/2018, 12:58 PM

## 2018-08-28 NOTE — Anesthesia Preprocedure Evaluation (Signed)
Anesthesia Evaluation  Patient identified by MRN, date of birth, ID band Patient awake    Reviewed: Allergy & Precautions, NPO status , Patient's Chart, lab work & pertinent test results  Airway Mallampati: II  TM Distance: >3 FB Neck ROM: Full    Dental  (+) Dental Advisory Given   Pulmonary pneumonia,    Pulmonary exam normal breath sounds clear to auscultation       Cardiovascular hypertension, + angina + CAD, + Past MI and + DOE  Normal cardiovascular exam Rhythm:Regular Rate:Normal     Neuro/Psych  Headaches, PSYCHIATRIC DISORDERS Anxiety Depression    GI/Hepatic Neg liver ROS, hiatal hernia, PUD, GERD  ,  Endo/Other  negative endocrine ROSdiabetes  Renal/GU Renal disease     Musculoskeletal  (+) Arthritis ,   Abdominal   Peds  Hematology  (+) Blood dyscrasia, anemia ,   Anesthesia Other Findings   Reproductive/Obstetrics negative OB ROS                             Anesthesia Physical Anesthesia Plan  ASA: III  Anesthesia Plan: General   Post-op Pain Management:    Induction: Intravenous  PONV Risk Score and Plan: 4 or greater and Ondansetron, Dexamethasone and Treatment may vary due to age or medical condition  Airway Management Planned: Oral ETT  Additional Equipment:   Intra-op Plan:   Post-operative Plan: Extubation in OR  Informed Consent: I have reviewed the patients History and Physical, chart, labs and discussed the procedure including the risks, benefits and alternatives for the proposed anesthesia with the patient or authorized representative who has indicated his/her understanding and acceptance.   Patient has DNR.  Discussed DNR with patient and Suspend DNR.   Dental advisory given  Plan Discussed with: CRNA  Anesthesia Plan Comments:         Anesthesia Quick Evaluation

## 2018-08-28 NOTE — Consult Note (Signed)
ORTHOPAEDIC CONSULTATION  REQUESTING PHYSICIAN: Dessa Phi, DO  PCP:  Colon Branch, MD  Chief Complaint: Left hip injury  HPI: [name] is a 78 y.o. 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  She landed on her left hip.  She had left hip pain and inability to weight-bear.  She was brought to the emergency department at [facility], where x-rays revealed a nondisplaced left intertrochanteric femur fracture.  She denies other injuries.  She was admitted by the hospitalist for perioperative risk stratification and medical optimization.  Orthopedic surgery consultation was placed for management of the left hip fracture.  The patient states that she lives alone at home.  She does have family in town.  She does not normally use any assist device.  Past Medical History:  Diagnosis Date  . Acute blood loss anemia 09/17/2015  . Allergy   . Anemia   . Anginal pain ([place]) 2017  . Anxiety   . Arthritis    "hands and knees mostly" (09/16/2015)  . B12 deficiency anemia   . CAD (coronary artery disease)    Dr Gwenlyn Found  . Cataract    bil cataracts removed  . CLL (chronic lymphocytic leukemia) ([place]) 02/25/2014  . Clotting disorder ([place])   . Depression   . DJD (degenerative joint disease)   . 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 Gastritis   . GERD (gastroesophageal reflux disease)   . Headache(784.0)   . History of blood transfusion    "I"ve had 20 some; thru birth of children, loss of blood, last 2 were in ~ 1999 before my cancer surgery" (09/16/2015)  . History of hiatal hernia   . History of shingles   . Hx of colonic polyps   . Hyperlipidemia   . Hypertension   . 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 Myocardial  infarction ([place])    1997  . Pneumonia    X 1  . Postoperative hematoma involving circulatory system following cardiac catheterization 09/17/2015  . S/P cardiac cath 09/16/15 09/17/2015  . Type II diabetes mellitus ([place])   . 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 Venous insufficiency    Past Surgical History:  Procedure Laterality Date  . CARDIAC CATHETERIZATION  1997; 2008; 09/16/2015  . CARDIAC CATHETERIZATION N/A 09/16/2015   Procedure: Right/Left Heart Cath and Coronary/Graft Angiography;  Surgeon: Lorretta Harp, MD;  Location: [facility];  Service: Cardiovascular;  Laterality: N/A;  . CESAREAN SECTION  1966  . COLONOSCOPY    . CORONARY ARTERY BYPASS GRAFT  1997   CABG X5  . EYE SURGERY     2 laser surgeries on left eye with implant  . EYE SURGERY Bilateral    cataracts  . FRACTURE SURGERY    . HERNIA REPAIR    . LAPAROSCOPIC ASSISTED VENTRAL HERNIA REPAIR  09/2008    with incarcerated colon;  Dr. Lucia Gaskins  . MOHS SURGERY  06/2016   BCC  . PATELLA FRACTURE SURGERY Left 11/1994   "crushed my knee"; put in a plate & 6 screws"  . POLYPECTOMY    . REFRACTIVE SURGERY Left 07/30/2015  . resection of small bowel carcinoma  06/1998  Dr. March Rummage  . SHOULDER ARTHROSCOPY W/ ROTATOR CUFF REPAIR Right 1997  . SMALL INTESTINE SURGERY    . TONSILLECTOMY  1960s  . TOTAL KNEE ARTHROPLASTY WITH HARDWARE REMOVAL Left 12/1994    (infex, hardware removed )  . TUBAL LIGATION  1966   Social History   Socioeconomic History  . Marital status: Widowed    Spouse name: Not on file  . Number of children: 2  . Years of education: Not on file  . Highest education level: Not on file  Occupational History  . Occupation: retired-- Research officer, trade union: RETIRED  Social Needs  . Financial resource strain: Not hard at all  . Food insecurity    Worry: Never true    Inability: Never true  . Transportation needs    Medical: No    Non-medical: No  Tobacco Use  .  Smoking status: Never Smoker  . Smokeless tobacco: Never Used  Substance and Sexual Activity  . Alcohol use: No    Alcohol/week: 0.0 standard drinks  . Drug use: No  . Sexual activity: Not Currently  Lifestyle  . Physical activity    Days per week: 0 days    Minutes per session: 0 min  . Stress: Not at all  Relationships  . Social connections    Talks on phone: More than three times a week    Gets together: More than three times a week    Attends religious service: More than 4 times per year    Active member of club or organization: Patient refused    Attends meetings of clubs or organizations: Patient refused    Relationship status: Patient refused  Other Topics Concern  . Not on file  Social History Narrative   Lives by herself, lost husband ~ 2004    1 child in [place]   I child in [place]   Family History  Problem Relation Age of Onset  . Heart disease Mother 76  . Heart disease Father 70       MI  . Hypertension Child   . Heart attack Child   . Diabetes Child   . Heart disease Maternal Aunt        x 2, all deceased  . Heart disease Maternal Uncle        x 4, all deceased  . Emphysema Brother 22  . Colon cancer Neg Hx   . Breast cancer Neg Hx   . Rectal cancer Neg Hx   . Stomach cancer Neg Hx    Allergies  Allergen Reactions  . Codeine     REACTION: makes her nervous, orTylenol #3  . Ibuprofen     REACTION: nervous  . Meperidine Hcl     REACTION: nasuea and vomitting  . Naproxen Sodium     REACTION: nervous   Prior to Admission medications   Medication Sig Start Date End Date Taking? Authorizing Provider  amLODipine (NORVASC) 5 MG tablet TAKE 1 TABLET(5 MG) BY MOUTH DAILY Patient taking differently: Take 5 mg by mouth daily. TAKE 1 TABLET(5 MG) BY MOUTH DAILY 07/29/18  Yes Elayne Snare, MD  aspirin EC 81 MG EC tablet Take 1 tablet (81 mg total) by mouth daily. 09/17/15  Yes Isaiah Serge, NP  atenolol (TENORMIN) 100 MG tablet Take 1 tablet (100 mg total) by  mouth daily. 05/10/18  Yes Paz, Alda Berthold, MD  atorvastatin (LIPITOR) 20 MG tablet TAKE 1 TABLET(20 MG) BY MOUTH DAILY Patient taking differently: Take  20 mg by mouth daily.  03/26/18  Yes Lorretta Harp, MD  azelastine (ASTELIN) 0.1 % nasal spray Place 2 sprays into both nostrils at bedtime as needed for rhinitis. Use in each nostril as directed 10/26/15  Yes Paz, Alda Berthold, MD  cholecalciferol (VITAMIN D) 1000 UNITS tablet Take 1,000 Units by mouth daily.     Yes [provider]  clopidogrel (PLAVIX) 75 MG tablet Take 1 tablet (75 mg total) by mouth daily. 07/19/18  Yes Paz, Alda Berthold, MD  clorazepate (TRANXENE) 7.5 MG tablet Take 1 tablet (7.5 mg total) by mouth as needed. Patient taking differently: Take 7.5 mg by mouth daily as needed for anxiety or sleep.  10/04/17  Yes Paz, Alda Berthold, MD  fluticasone (FLONASE) 50 MCG/ACT nasal spray SHAKE LIQUID AND USE 2 SPRAYS IN EACH NOSTRIL DAILY Patient taking differently: Place 2 sprays into both nostrils daily as needed for allergies.  09/19/17  Yes Paz, Alda Berthold, MD  HYDROcodone-acetaminophen (NORCO/VICODIN) 5-325 MG tablet Take 1 tablet by mouth 3 (three) times daily as needed for moderate pain. 05/23/18  Yes Paz, Alda Berthold, MD  insulin NPH Human (HUMULIN N,NOVOLIN N) 100 UNIT/ML injection 20-38 Units 2 (two) times daily before a meal. Inject 38 units into the skin every morning and inject 20 units into the skin at bedtime.   Yes [provider]  insulin regular (NOVOLIN R,HUMULIN R) 100 units/mL injection Inject 14 Units into the skin 2 (two) times daily before a meal.    Yes [provider]  irbesartan (AVAPRO) 300 MG tablet TAKE 1 TABLET(300 MG) BY MOUTH DAILY Patient taking differently: Take 300 mg by mouth every evening.  08/12/18  Yes Lorretta Harp, MD  isosorbide mononitrate (IMDUR) 30 MG 24 hr tablet TAKE 1 TABLET(30 MG) BY MOUTH DAILY Patient taking differently: Take 30 mg by mouth daily. TAKE 1 TABLET(30 MG) BY MOUTH DAILY 06/03/18   Yes Lorretta Harp, MD  liraglutide (VICTOZA) 18 MG/3ML SOPN ADMINISTER 1.2 MG UNDER THE SKIN EVERY DAY Patient taking differently: Inject 1.6 mg into the skin every evening. ADMINISTER 1.6 MG UNDER THE SKIN EVERY DAY 06/10/18  Yes Elayne Snare, MD  metFORMIN (GLUCOPHAGE) 500 MG tablet Take 1 tablet by mouth every morning and 2 tablets by mouth every evening 08/16/18  Yes Elayne Snare, MD  Multiple Vitamin (MULTIVITAMIN) capsule Take 1 capsule by mouth daily.     Yes [provider]  nitroGLYCERIN (NITROSTAT) 0.3 MG SL tablet Place 1 tablet (0.3 mg total) under the tongue every 5 (five) minutes as needed for chest pain (ER if no better after 3 tablets). 05/30/18  Yes Paz, Alda Berthold, MD  omeprazole (PRILOSEC) 20 MG capsule Take 1 capsule (20 mg total) by mouth daily. 10/15/17  Yes Nandigam, Venia Minks, MD  Blood Glucose Monitoring Suppl (ONE TOUCH ULTRA SYSTEM KIT) W/DEVICE KIT 1 kit by Does not apply route once. 05/17/12   Parrett, Fonnie Mu, NP  Insulin Syringe-Needle U-100 (INSULIN SYRINGE .5CC/31GX5/16") 31G X 5/16" 0.5 ML MISC USE TO INJECT INSULIN 5 TIMES PER DAY 11/05/15   Elayne Snare, MD  mometasone (ELOCON) 0.1 % cream Apply 1 application topically daily. Use as directed 04/18/16   [provider]  ONE TOUCH ULTRA TEST test strip USE TO TEST BLOOD SUGAR TWICE DAILY AS DIRECTED 03/26/18   Elayne Snare, MD   Dg Chest 1 View  Result Date: 08/27/2018 CLINICAL DATA:  Hip fracture.  Fall. EXAM: CHEST  1 VIEW COMPARISON:  07/25/2017 FINDINGS: Prior CABG. Scarring in the lingula. Heart is borderline in size. Right lung clear. No effusions. No acute bony abnormality. IMPRESSION: Prior CABG.  Lingular scarring.  No active disease. Electronically Signed   By: Rolm Baptise M.D.   On: 08/27/2018 22:30   Dg Hip Unilat With Pelvis 2-3 Views Left  Result Date: 08/27/2018 CLINICAL DATA:  Fall, hip pain EXAM: DG HIP (WITH OR WITHOUT PELVIS) 2-3V LEFT COMPARISON:  None. FINDINGS: There is a left femoral  intertrochanteric fracture. This is nondisplaced. No angulation. No subluxation or dislocation. IMPRESSION: No displaced left femoral intertrochanteric fracture. Electronically Signed   By: Rolm Baptise M.D.   On: 08/27/2018 22:31    Positive ROS: All other systems have been reviewed and were otherwise negative with the exception of those mentioned in the HPI and as above.  Physical Exam: General: Alert, no acute distress Cardiovascular: No pedal edema Respiratory: No cyanosis, no use of accessory musculature GI: No organomegaly, abdomen is soft and non-tender Skin: No lesions in the area of chief complaint Neurologic: Sensation intact distally Psychiatric: Patient is competent for consent with normal mood and affect Lymphatic: No axillary or cervical lymphadenopathy  MUSCULOSKELETAL: Examination of the left hip reveals no skin wounds or lesions.  There is no shortening.  She is holding the extremity and external rotation.  She does have pain with logrolling of the hip.  She is neurovascularly intact distally.  Assessment: Nondisplaced left intertrochanteric femur fracture.  Plan: I discussed the findings with the patient.  This is a high risk fracture for displacement.  Therefore, I recommend intramedullary fixation.  We discussed the risk, benefits, and alternatives.  Please see statement of risk.  Plan for surgery today.  We will then immobilize her out of bed with physical therapy postoperatively.  All questions were solicited and answered.  The risks, benefits, and alternatives were discussed with the patient. There are risks associated with the surgery including, but not limited to, problems with anesthesia (death), infection, differences in leg length/angulation/rotation, fracture of bones, loosening or failure of implants, malunion, nonunion, hematoma (blood accumulation) which may require surgical drainage, blood clots, pulmonary embolism, nerve injury (foot drop), and blood vessel  injury. The patient understands these risks and elects to proceed.   Bertram Savin, MD Cell [phone]    08/28/2018 10:38 AM

## 2018-08-28 NOTE — Anesthesia Procedure Notes (Signed)
Procedure Name: Intubation Date/Time: 08/28/2018 11:15 AM Performed by: Claudia Desanctis, CRNA Pre-anesthesia Checklist: Patient identified, Emergency Drugs available, Suction available and Patient being monitored Patient Re-evaluated:Patient Re-evaluated prior to induction Oxygen Delivery Method: Circle system utilized Preoxygenation: Pre-oxygenation with 100% oxygen Induction Type: IV induction Ventilation: Mask ventilation without difficulty Laryngoscope Size: 2 and Miller Grade View: Grade I Tube type: Oral Tube size: 7.0 mm Number of attempts: 1 Airway Equipment and Method: Stylet Placement Confirmation: ETT inserted through vocal cords under direct vision,  positive ETCO2 and breath sounds checked- equal and bilateral Secured at: 21 cm Tube secured with: Tape Dental Injury: Teeth and Oropharynx as per pre-operative assessment

## 2018-08-28 NOTE — Progress Notes (Signed)
CRITICAL VALUE STICKER  CRITICAL VALUE: MRSA PCR +  RECEIVER (on-site recipient of call):  Rich Reining   DATE & TIME NOTIFIED: 8/26 0830  MESSENGER (representative from lab):  Standing Order set ordered.

## 2018-08-28 NOTE — Transfer of Care (Signed)
Immediate Anesthesia Transfer of Care Note  Patient: Makena  Procedure(s) Performed: INTRAMEDULLARY (IM) NAIL FEMORAL (Left Hip)  Patient Location: PACU  Anesthesia Type:General  Level of Consciousness: awake and patient cooperative  Airway & Oxygen Therapy: Patient Spontanous Breathing and Patient connected to face mask  Post-op Assessment: Report given to RN and Post -op Vital signs reviewed and stable  Post vital signs: Reviewed and stable  Last Vitals:  Vitals Value Taken Time  BP 121/53 08/28/18 1300  Temp    Pulse 58 08/28/18 1304  Resp 8 08/28/18 1304  SpO2 98 % 08/28/18 1304  Vitals shown include unvalidated device data.  Last Pain:  Vitals:   08/28/18 1030  TempSrc: Oral  PainSc:       Patients Stated Pain Goal: 4 ([id number])  Complications: No apparent anesthesia complications

## 2018-08-28 NOTE — Progress Notes (Signed)
Receieved patient from PACU via bed. Patient drowsy, but arousable. Left leg dressing CDI with ice to site. Patient on 2L 02 from sx. Resting comfortably, will continue to monitor.

## 2018-08-28 NOTE — Plan of Care (Signed)
  Problem: Education: Goal: Knowledge of General Education information will improve Description Including pain rating scale, medication(s)/side effects and non-pharmacologic comfort measures Outcome: Progressing   

## 2018-08-28 NOTE — Discharge Instructions (Signed)
 Dr. Sunset Joshi Adult Hip & Knee Specialist [facility] [address] [place], [place] [address] [phone]   POSTOPERATIVE DIRECTIONS    Hip Rehabilitation, Guidelines Following Surgery   WEIGHT BEARING Weight bearing as tolerated with assist device (walker, cane, etc) as directed, use it as long as suggested by your surgeon or therapist, typically at least 4-6 weeks.   HOME CARE INSTRUCTIONS  Remove items at home which could result in a fall. This includes throw rugs or furniture in walking pathways.  Continue medications as instructed at time of discharge.  You may have some home medications which will be placed on hold until you complete the course of blood thinner medication.  4 days after discharge, you may start showering. No tub baths or soaking your incisions. Do not put on socks or shoes without following the instructions of your caregivers.   Sit on chairs with arms. Use the chair arms to help push yourself up when arising.  Arrange for the use of a toilet seat elevator so you are not sitting low.   Walk with walker as instructed.  You may resume a sexual relationship in one month or when given the OK by your caregiver.  Use walker as long as suggested by your caregivers.  Avoid periods of inactivity such as sitting longer than an hour when not asleep. This helps prevent blood clots.  You may return to work once you are cleared by your surgeon.  Do not drive a car for 6 weeks or until released by your surgeon.  Do not drive while taking narcotics.  Wear elastic stockings for two weeks following surgery during the day but you may remove then at night.  Make sure you keep all of your appointments after your operation with all of your doctors and caregivers. You should call the office at the above phone number and make an appointment for approximately two weeks after the date of your surgery. Please pick up a stool softener and laxative  for home use as long as you are requiring pain medications.  ICE to the affected hip every three hours for 30 minutes at a time and then as needed for pain and swelling. Continue to use ice on the hip for pain and swelling from surgery. You may notice swelling that will progress down to the foot and ankle.  This is normal after surgery.  Elevate the leg when you are not up walking on it.   It is important for you to complete the blood thinner medication as prescribed by your doctor.  Continue to use the breathing machine which will help keep your temperature down.  It is common for your temperature to cycle up and down following surgery, especially at night when you are not up moving around and exerting yourself.  The breathing machine keeps your lungs expanded and your temperature down.  RANGE OF MOTION AND STRENGTHENING EXERCISES  These exercises are designed to help you keep full movement of your hip joint. Follow your caregiver's or physical therapist's instructions. Perform all exercises about fifteen times, three times per day or as directed. Exercise both hips, even if you have had only one joint replacement. These exercises can be done on a training (exercise) mat, on the floor, on a table or on a bed. Use whatever works the best and is most comfortable for you. Use music or television while you are exercising so that the exercises are a pleasant break in your day. This   will make your life better with the exercises acting as a break in routine you can look forward to.  Lying on your back, slowly slide your foot toward your buttocks, raising your knee up off the floor. Then slowly slide your foot back down until your leg is straight again.  Lying on your back spread your legs as far apart as you can without causing discomfort.  Lying on your side, raise your upper leg and foot straight up from the floor as far as is comfortable. Slowly lower the leg and repeat.  Lying on your back, tighten up the  muscle in the front of your thigh (quadriceps muscles). You can do this by keeping your leg straight and trying to raise your heel off the floor. This helps strengthen the largest muscle supporting your knee.  Lying on your back, tighten up the muscles of your buttocks both with the legs straight and with the knee bent at a comfortable angle while keeping your heel on the floor.   SKILLED REHAB INSTRUCTIONS: If the patient is transferred to a skilled rehab facility following release from the hospital, a list of the current medications will be sent to the facility for the patient to continue.  When discharged from the skilled rehab facility, please have the facility set up the patient's Home Health Physical Therapy prior to being released. Also, the skilled facility will be responsible for providing the patient with their medications at time of release from the facility to include their pain medication and their blood thinner medication. If the patient is still at the rehab facility at time of the two week follow up appointment, the skilled rehab facility will also need to assist the patient in arranging follow up appointment in our office and any transportation needs.  MAKE SURE YOU:  Understand these instructions.  Will watch your condition.  Will get help right away if you are not doing well or get worse.  Pick up stool softner and laxative for home use following surgery while on pain medications. Daily dry dressing changes as needed. In 4 days, you may remove your dressings and begin taking showers - no tub baths or soaking the incisions. Continue to use ice for pain and swelling after surgery. Do not use any lotions or creams on the incision until instructed by your surgeon.   

## 2018-08-28 NOTE — Op Note (Signed)
OPERATIVE REPORT  SURGEON: Rod Can, MD   ASSISTANT: Staff.  PREOPERATIVE DIAGNOSIS: Left intertrochanteric femur fracture.   POSTOPERATIVE DIAGNOSIS: Left intertrochanteric femur fracture.   PROCEDURE: Intramedullary fixation, Left femur.   IMPLANTS: Stryker Gamma3 Hip Fracture Nail, 11 by 180 mm, 125 degrees. 10.5 105 mm Hip Fracture Nail Lag Screw. 5 x 40 mm distal interlocking screw 1.  ANESTHESIA:  General  ESTIMATED BLOOD LOSS:-50 mL    ANTIBIOTICS: 2 g Ancef. 1 g vancomycin (positive MRSA screen).  DRAINS: None.  COMPLICATIONS: None.   CONDITION: PACU - hemodynamically stable.   BRIEF CLINICAL NOTE: [name] is a 78 y.o. female who presented with an intertrochanteric femur fracture. The patient was admitted to the hospitalist service and underwent perioperative risk stratification and medical optimization. The risks, benefits, and alternatives to the procedure were explained, and the patient elected to proceed.  PROCEDURE IN DETAIL: Surgical site was marked by myself. The patient was taken to the operating room and anesthesia was induced on the bed. The patient was then transferred to the [facility] table and the nonoperative lower extremity was scissored underneath the operative side. The fracture was reduced with traction, internal rotation, and adduction. The hip was prepped and draped in the normal sterile surgical fashion. Timeout was called verifying side and site of surgery. Preop antibiotics were given with 60 minutes of beginning the procedure.  Fluoroscopy was used to define the patient's anatomy. A 3 cm incision was made just proximal to the tip of the greater trochanter. The awl was used to obtain the standard starting point for a trochanteric entry nail under fluoroscopic control. The guidepin was placed. The entry reamer was used to open the proximal femur.  On the back table, the nail was assembled onto the jig. The nail was placed into the femur  without any difficulty. Through a separate stab incision, the cannula was placed down to the bone in preparation for the cephalomedullary device. A guidepin was placed into the femoral head using AP and lateral fluoroscopy views. The pin was measured, and then reaming was performed to the appropriate depth. The lag screw was inserted to the appropriate depth. The fracture was compressed through the jig. The setscrew was tightened and then loosened one quarter turn. A separate stab incision was created, and the distal interlocking screw was placed using standard AO technique. The jig was removed. Final AP and lateral fluoroscopy views were obtained to confirm fracture reduction and hardware placement. Tip apex distance was appropriate. There was no chondral penetration.  The wounds were copiously irrigated with saline. The wound was closed in layers with #1 Vicryl for the fascia, 2-0 Monocryl for the deep dermal layer, and 3-0 Monocryl subcuticular stitch. Glue was applied to the skin. Once the glue was fully hardened, sterile dressing was applied. The patient was then awakened from anesthesia and taken to the PACU in stable condition. Sponge needle and instrument counts were correct at the end of the case 2. There were no known complications.  We will readmit the patient to the hospitalist. Weightbearing status will be weightbearing as tolerated with a walker. We will resume Plavix and ASA 81 mg PO BID for DVT prophylaxis. The patient will work with physical therapy and undergo disposition planning.

## 2018-08-28 NOTE — Progress Notes (Signed)
On call notified of pain 8/10, and Dilaudid not due again til 5:30am, and pt NPO.

## 2018-08-28 NOTE — ED Notes (Signed)
ED TO INPATIENT HANDOFF REPORT  Name/Age/Gender [name] 78 y.o. female  Code Status    Code Status Orders  (From admission, onward)         Start     Ordered   08/28/18 0017  Limited resuscitation (code)  Continuous    Question Answer Comment  In the event of cardiac or respiratory ARREST: Initiate Code Blue, Call Rapid Response Yes   In the event of cardiac or respiratory ARREST: Perform CPR Yes   In the event of cardiac or respiratory ARREST: Perform Intubation/Mechanical Ventilation No   In the event of cardiac or respiratory ARREST: Use NIPPV/BiPAp only if indicated Yes   In the event of cardiac or respiratory ARREST: Administer ACLS medications if indicated Yes   In the event of cardiac or respiratory ARREST: Perform Defibrillation or Cardioversion if indicated Yes      08/28/18 0020        Code Status History    Date Active Date Inactive Code Status Order ID Comments User Context   09/16/2015 1407 09/17/2015 1628 Full Code [id number]  Lorretta Harp, MD Inpatient   09/16/2015 1407 09/16/2015 1407 Full Code [id number]  Lorretta Harp, MD Inpatient   Advance Care Planning Activity      Home/SNF/Other Home  Chief Complaint Left Hip Pain  Level of Care/Admitting Diagnosis ED Disposition    ED Disposition Condition [place]: [facility] [[id number]]  Level of Care: Med-Surg [16]  Covid Evaluation: Asymptomatic Screening Protocol (No Symptoms)  Diagnosis: Intertrochanteric fracture of left femur, closed[id number]  Admitting Physician: Lenore Cordia [id number]  Attending Physician: Lenore Cordia [id number]  Estimated length of stay: past midnight tomorrow  Certification:: I certify this patient will need inpatient services for at least 2 midnights  PT Class (Do Not Modify): Inpatient [101]  PT Acc Code (Do Not Modify): Private [1]       Medical History Past Medical History:   Diagnosis Date  . Acute blood loss anemia 09/17/2015  . Allergy   . Anemia   . Anginal pain ([place]) 2017  . Anxiety   . Arthritis    "hands and knees mostly" (09/16/2015)  . B12 deficiency anemia   . CAD (coronary artery disease)    Dr Gwenlyn Found  . Cataract    bil cataracts removed  . 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 Clotting disorder ([place])   . Depression   . DJD (degenerative joint disease)   . 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 Gastritis   . GERD (gastroesophageal reflux disease)   . Headache(784.0)   . History of blood transfusion    "I"ve had 20 some; thru birth of children, loss of blood, last 2 were in ~ 1999 before my cancer surgery" (09/16/2015)  . History of hiatal hernia   . History of shingles   . Hx of colonic polyps   . Hyperlipidemia   . Hypertension   . 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 Myocardial infarction ([place])    1997  . Pneumonia    X 1  . Postoperative hematoma involving circulatory system following cardiac catheterization 09/17/2015  . S/P cardiac cath 09/16/15 09/17/2015  . Type II diabetes mellitus ([place])   . 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 Venous insufficiency     Allergies Allergies  Allergen Reactions  . Codeine  REACTION: makes her nervous, orTylenol #3  . Ibuprofen     REACTION: nervous  . Meperidine Hcl     REACTION: nasuea and vomitting  . Naproxen Sodium     REACTION: nervous    IV Location/Drains/Wounds Patient Lines/Drains/Airways Status   Active Line/Drains/Airways    Name:   Placement date:   Placement time:   Site:   Days:   Peripheral IV 08/27/18 Right Antecubital   08/27/18    -    Antecubital   1          Labs/Imaging Results for orders placed or performed during the hospital encounter of 08/27/18 (from the past 48 hour(s))  Basic metabolic panel     Status: Abnormal   Collection Time: 08/27/18 10:10 PM  Result Value Ref Range    Sodium 142 135 - 145 mmol/L   Potassium 4.4 3.5 - 5.1 mmol/L   Chloride 110 98 - 111 mmol/L   CO2 23 22 - 32 mmol/L   Glucose, Bld 94 70 - 99 mg/dL   BUN 32 (H) 8 - 23 mg/dL   Creatinine, Ser 2.38 (H) 0.44 - 1.00 mg/dL   Calcium 9.5 8.9 - 10.3 mg/dL   GFR calc non Af Amer 19 (L) >60 mL/min   GFR calc Af Amer 22 (L) >60 mL/min   Anion gap 9 5 - 15    Comment: Performed at [facility], [place] [address] [place], [place] [address]  CBC WITH DIFFERENTIAL     Status: Abnormal   Collection Time: 08/27/18 10:10 PM  Result Value Ref Range   WBC 58.3 (HH) 4.0 - 10.5 K/uL    Comment: REPEATED TO VERIFY WHITE COUNT CONFIRMED ON SMEAR THIS CRITICAL RESULT HAS VERIFIED AND BEEN CALLED TO RN J OXENDAUN BY ALEXIS [place] ON 08 25 2020 AT 2313, AND HAS BEEN READ BACK.     RBC 3.54 (L) 3.87 - 5.11 MIL/uL   Hemoglobin 10.9 (L) 12.0 - 15.0 g/dL   HCT 34.9 (L) 36.0 - 46.0 %   MCV 98.6 80.0 - 100.0 fL   MCH 30.8 26.0 - 34.0 pg   MCHC 31.2 30.0 - 36.0 g/dL   RDW 13.4 11.5 - 15.5 %   Platelets 178 150 - 400 K/uL   nRBC 0.0 0.0 - 0.2 %   Neutrophils Relative % 47 %   Neutro Abs 27.4 (H) 1.7 - 7.7 K/uL   Lymphocytes Relative 49 %   Lymphs Abs 28.6 (H) 0.7 - 4.0 K/uL   Monocytes Relative 4 %   Monocytes Absolute 2.3 (H) 0.1 - 1.0 K/uL   Eosinophils Relative 0 %   Eosinophils Absolute 0.0 0.0 - 0.5 K/uL   Basophils Relative 0 %   Basophils Absolute 0.0 0.0 - 0.1 K/uL   nRBC 0 0 /100 WBC    Comment: Performed at [facility], [place] [address], [place], [place] [address]  Protime-INR     Status: None   Collection Time: 08/27/18 10:10 PM  Result Value Ref Range   Prothrombin Time 14.0 11.4 - 15.2 seconds   INR 1.1 0.8 - 1.2    Comment: (NOTE) INR goal varies based on device and disease states. Performed at [facility], [place] [address] [place], [place] [address]   Type and screen [place]     Status: None    Collection Time: 08/27/18 10:10 PM  Result Value Ref Range   ABO/RH(D) O POS  Antibody Screen NEG    Sample Expiration      08/30/2018,2359 Performed at [facility], [place] [address], [place], [place] [address]   ABO/Rh     Status: None (Preliminary result)   Collection Time: 08/27/18 10:30 PM  Result Value Ref Range   ABO/RH(D)      O POS Performed at [facility], [place] [address] [place], [place] [address]    Dg Chest 1 View  Result Date: 08/27/2018 CLINICAL DATA:  Hip fracture.  Fall. EXAM: CHEST  1 VIEW COMPARISON:  07/25/2017 FINDINGS: Prior CABG. Scarring in the lingula. Heart is borderline in size. Right lung clear. No effusions. No acute bony abnormality. IMPRESSION: Prior CABG.  Lingular scarring.  No active disease. Electronically Signed   By: Rolm Baptise M.D.   On: 08/27/2018 22:30   Dg Hip Unilat With Pelvis 2-3 Views Left  Result Date: 08/27/2018 CLINICAL DATA:  Fall, hip pain EXAM: DG HIP (WITH OR WITHOUT PELVIS) 2-3V LEFT COMPARISON:  None. FINDINGS: There is a left femoral intertrochanteric fracture. This is nondisplaced. No angulation. No subluxation or dislocation. IMPRESSION: No displaced left femoral intertrochanteric fracture. Electronically Signed   By: Rolm Baptise M.D.   On: 08/27/2018 22:31    Pending Labs Unresulted Labs (From admission, onward)    Start     Ordered   08/28/18 0500  CBC  Tomorrow morning,   R     08/28/18 0020   08/28/18 [id number]  Basic metabolic panel  Tomorrow morning,   R     08/28/18 0020   08/28/18 0500  VITAMIN D 25 Hydroxy (Vit-D Deficiency, Fractures)  Tomorrow morning,   R     08/28/18 0020   08/28/18 0023  Hemoglobin A1c  Add-on,   AD    Comments: To assess prior glycemic control    08/28/18 0022   08/27/18 2242  SARS CORONAVIRUS 2 (TAT 6-12 HRS)  Once,   R     08/27/18 2242          Vitals/Pain Today's Vitals   08/27/18 2338 08/28/18 0015 08/28/18 0108 08/28/18 0130  BP:  134/75  135/68 (!) 134/56  Pulse:  81 73 73  Resp:  19 13 18   Temp:      TempSrc:      SpO2:  98% 99% 98%  PainSc: 4        Isolation Precautions No active isolations  Medications Medications  oxyCODONE (Oxy IR/ROXICODONE) immediate release tablet 5-10 mg (has no administration in time range)  HYDROmorphone (DILAUDID) injection 0.5 mg (has no administration in time range)  senna-docusate (Senokot-S) tablet 1 tablet (has no administration in time range)  acetaminophen (TYLENOL) tablet 650 mg (has no administration in time range)  atorvastatin (LIPITOR) tablet 20 mg (has no administration in time range)  clorazepate (TRANXENE) tablet 7.5 mg (has no administration in time range)  insulin NPH Human (NOVOLIN N) injection 10-20 Units (has no administration in time range)  isosorbide mononitrate (IMDUR) 24 hr tablet 30 mg (has no administration in time range)  pantoprazole (PROTONIX) EC tablet 40 mg (has no administration in time range)  insulin aspart (novoLOG) injection 0-9 Units (has no administration in time range)  0.9 %  sodium chloride infusion (has no administration in time range)  fentaNYL (SUBLIMAZE) injection 50 mcg (50 mcg Intravenous Given 08/27/18 2252)  ondansetron (ZOFRAN) injection 4 mg (4 mg Intravenous Given 08/27/18 2252)  sodium chloride 0.9 % bolus 1,000 mL (1,000 mLs Intravenous  New Bag/Given 08/28/18 0127)    Mobility non-ambulatory; hip fracture

## 2018-08-29 ENCOUNTER — Telehealth: Payer: Self-pay | Admitting: Endocrinology

## 2018-08-29 ENCOUNTER — Inpatient Hospital Stay (HOSPITAL_COMMUNITY): Payer: Medicare Other

## 2018-08-29 ENCOUNTER — Encounter (HOSPITAL_COMMUNITY): Payer: Self-pay | Admitting: Orthopedic Surgery

## 2018-08-29 LAB — GLUCOSE, CAPILLARY
Glucose-Capillary: 169 mg/dL — ABNORMAL HIGH (ref 70–99)
Glucose-Capillary: 174 mg/dL — ABNORMAL HIGH (ref 70–99)
Glucose-Capillary: 184 mg/dL — ABNORMAL HIGH (ref 70–99)
Glucose-Capillary: 197 mg/dL — ABNORMAL HIGH (ref 70–99)
Glucose-Capillary: 199 mg/dL — ABNORMAL HIGH (ref 70–99)
Glucose-Capillary: 254 mg/dL — ABNORMAL HIGH (ref 70–99)

## 2018-08-29 LAB — CBC
HCT: 27.4 % — ABNORMAL LOW (ref 36.0–46.0)
Hemoglobin: 8.4 g/dL — ABNORMAL LOW (ref 12.0–15.0)
MCH: 30.5 pg (ref 26.0–34.0)
MCHC: 30.7 g/dL (ref 30.0–36.0)
MCV: 99.6 fL (ref 80.0–100.0)
Platelets: 156 10*3/uL (ref 150–400)
RBC: 2.75 MIL/uL — ABNORMAL LOW (ref 3.87–5.11)
RDW: 14 % (ref 11.5–15.5)
WBC: 57.3 10*3/uL (ref 4.0–10.5)
nRBC: 0 % (ref 0.0–0.2)

## 2018-08-29 LAB — BASIC METABOLIC PANEL
Anion gap: 9 (ref 5–15)
BUN: 30 mg/dL — ABNORMAL HIGH (ref 8–23)
CO2: 18 mmol/L — ABNORMAL LOW (ref 22–32)
Calcium: 7.5 mg/dL — ABNORMAL LOW (ref 8.9–10.3)
Chloride: 110 mmol/L (ref 98–111)
Creatinine, Ser: 1.71 mg/dL — ABNORMAL HIGH (ref 0.44–1.00)
GFR calc Af Amer: 33 mL/min — ABNORMAL LOW (ref >60)
GFR calc non Af Amer: 28 mL/min — ABNORMAL LOW (ref >60)
Glucose, Bld: 227 mg/dL — ABNORMAL HIGH (ref 70–99)
Potassium: 5.3 mmol/L — ABNORMAL HIGH (ref 3.5–5.1)
Sodium: 137 mmol/L (ref 135–145)

## 2018-08-29 LAB — SARS CORONAVIRUS 2 (TAT 6-24 HRS): SARS Coronavirus 2: NEGATIVE

## 2018-08-29 LAB — VITAMIN D 25 HYDROXY (VIT D DEFICIENCY, FRACTURES): Vit D, 25-Hydroxy: 37.2 ng/mL (ref 30.0–100.0)

## 2018-08-29 MED ORDER — ASPIRIN 81 MG PO CHEW: 81.0000 mg | CHEWABLE_TABLET | Freq: Two times a day (BID) | ORAL | 1 refills | Status: DC

## 2018-08-29 MED ORDER — INSULIN ASPART 100 UNIT/ML ~~LOC~~ SOLN
0.0000 [IU] | Freq: Three times a day (TID) | SUBCUTANEOUS | Status: DC
  Administered 2018-08-29 – 2018-08-30 (×2): 2 [IU] via SUBCUTANEOUS
  Administered 2018-08-30: 1 [IU] via SUBCUTANEOUS
  Administered 2018-08-30 – 2018-08-31 (×3): 2 [IU] via SUBCUTANEOUS
  Administered 2018-08-31: 13:00:00 3 [IU] via SUBCUTANEOUS

## 2018-08-29 MED ORDER — INSULIN NPH (HUMAN) (ISOPHANE) 100 UNIT/ML ~~LOC~~ SUSP
20.0000 [IU] | Freq: Every day | SUBCUTANEOUS | Status: DC
  Administered 2018-08-29 – 2018-08-31 (×3): 20 [IU] via SUBCUTANEOUS
  Filled 2018-08-29: qty 10

## 2018-08-29 MED ORDER — HYDROCODONE-ACETAMINOPHEN 5-325 MG PO TABS
1.0000 | ORAL_TABLET | ORAL | Status: DC | PRN
  Administered 2018-08-29 – 2018-08-31 (×9): 2 via ORAL
  Filled 2018-08-29 (×9): qty 2

## 2018-08-29 MED ORDER — HYDROCODONE-ACETAMINOPHEN 5-325 MG PO TABS: 2.0000 | ORAL_TABLET | Freq: Four times a day (QID) | ORAL | 0 refills | Status: DC | PRN

## 2018-08-29 MED ORDER — AMLODIPINE BESYLATE 5 MG PO TABS
5.0000 mg | ORAL_TABLET | Freq: Every day | ORAL | Status: DC
  Administered 2018-08-29 – 2018-08-31 (×3): 5 mg via ORAL
  Filled 2018-08-29 (×3): qty 1

## 2018-08-29 MED ORDER — SODIUM ZIRCONIUM CYCLOSILICATE 10 G PO PACK
10.0000 g | PACK | Freq: Once | ORAL | Status: AC
  Administered 2018-08-29: 10 g via ORAL
  Filled 2018-08-29: qty 1

## 2018-08-29 NOTE — Anesthesia Postprocedure Evaluation (Signed)
Anesthesia Post Note  Patient: [name]  Procedure(s) Performed: INTRAMEDULLARY (IM) NAIL FEMORAL (Left Hip)     Patient location during evaluation: PACU Anesthesia Type: General Level of consciousness: sedated and patient cooperative Pain management: pain level controlled Vital Signs Assessment: post-procedure vital signs reviewed and stable Respiratory status: spontaneous breathing Cardiovascular status: stable Anesthetic complications: no    Last Vitals:  Vitals:   08/29/18 0013 08/29/18 0350  BP: (!) 107/51 (!) 116/57  Pulse: 66 70  Resp: 18 18  Temp: 36.6 C 36.6 C  SpO2: 94% 99%    Last Pain:  Vitals:   08/29/18 1208  TempSrc:   PainSc: [place]

## 2018-08-29 NOTE — Plan of Care (Signed)
  Problem: Health Behavior/Discharge Planning: Goal: Ability to manage health-related needs will improve Outcome: Progressing   Problem: Clinical Measurements: Goal: Ability to maintain clinical measurements within normal limits will improve Outcome: Progressing Goal: Will remain free from infection Outcome: Progressing Goal: Diagnostic test results will improve Outcome: Progressing Goal: Respiratory complications will improve Outcome: Progressing Goal: Cardiovascular complication will be avoided Outcome: Progressing   Problem: Activity: Goal: Risk for activity intolerance will decrease Outcome: Progressing   Problem: Nutrition: Goal: Adequate nutrition will be maintained Outcome: Progressing   Problem: Coping: Goal: Level of anxiety will decrease Outcome: Progressing   Problem: Elimination: Goal: Will not experience complications related to bowel motility Outcome: Progressing Goal: Will not experience complications related to urinary retention Outcome: Progressing   Problem: Pain Managment: Goal: General experience of comfort will improve Outcome: Progressing   Problem: Safety: Goal: Ability to remain free from injury will improve Outcome: Progressing   Problem: Skin Integrity: Goal: Risk for impaired skin integrity will decrease Outcome: Progressing   Problem: Education: Goal: Verbalization of understanding the information provided (i.e., activity precautions, restrictions, etc) will improve Outcome: Progressing Goal: Individualized Educational Video(s) Outcome: Progressing   Problem: Activity: Goal: Ability to ambulate and perform ADLs will improve Outcome: Progressing   Problem: Clinical Measurements: Goal: Postoperative complications will be avoided or minimized Outcome: Progressing   Problem: Self-Concept: Goal: Ability to maintain and perform role responsibilities to the fullest extent possible will improve Outcome: Progressing   Problem: Pain  Management: Goal: Pain level will decrease Outcome: Progressing

## 2018-08-29 NOTE — Progress Notes (Signed)
    Subjective:  Patient reports pain as moderate to severe.  Denies N/V/CP/SOB. No c/o.  Objective:   VITALS:   Vitals:   08/28/18 1736 08/28/18 2011 08/29/18 0013 08/29/18 0350  BP: 112/69 115/66 (!) 107/51 (!) 116/57  Pulse: 67 66 66 70  Resp: 16 18 18 18   Temp: (!) 97.4 F (36.3 C) 97.7 F (36.5 C) 97.9 F (36.6 C) 97.8 F (36.6 C)  TempSrc: Oral  Oral Oral  SpO2: 98% 97% 94% 99%  Weight:      Height:        NAD ABD soft Sensation intact distally Intact pulses distally Dorsiflexion/Plantar flexion intact Incision: dressing C/D/I Compartment soft   Lab Results  Component Value Date   WBC 57.3 (HH) 08/29/2018   HGB 8.4 (L) 08/29/2018   HCT 27.4 (L) 08/29/2018   MCV 99.6 08/29/2018   PLT 156 08/29/2018   BMET    Component Value Date/Time   NA 137 08/29/2018 0432   NA 147 (H) 11/03/2016 1018   NA 143 08/30/2016 0954   K 5.3 (H) 08/29/2018 0432   K 3.9 11/03/2016 1018   K 4.5 08/30/2016 0954   CL 110 08/29/2018 0432   CL 110 (H) 11/03/2016 1018   CO2 18 (L) 08/29/2018 0432   CO2 27 11/03/2016 1018   CO2 24 08/30/2016 0954   GLUCOSE 227 (H) 08/29/2018 0432   GLUCOSE 95 11/03/2016 1018   BUN 30 (H) 08/29/2018 0432   BUN 25 (H) 11/03/2016 1018   BUN 20.5 08/30/2016 0954   CREATININE 1.71 (H) 08/29/2018 0432   CREATININE 1.46 (H) 03/06/2018 1144   CREATININE 1.6 (H) 11/03/2016 1018   CREATININE 1.4 (H) 08/30/2016 0954   CALCIUM 7.5 (L) 08/29/2018 0432   CALCIUM 10.1 11/03/2016 1018   CALCIUM 9.8 08/30/2016 0954   GFRNONAA 28 (L) 08/29/2018 0432   GFRNONAA 34 (L) 03/06/2018 1144   GFRNONAA 39 (L) 09/21/2015 1241   GFRAA 33 (L) 08/29/2018 0432   GFRAA 40 (L) 03/06/2018 1144   GFRAA 45 (L) 09/21/2015 1241     Assessment/Plan: 1 Day Post-Op   Principal Problem:   Intertrochanteric fracture of left femur, closed, initial encounter ([place]) Active Problems:   Insulin dependent diabetes mellitus with complications (HCC)   Hyperlipidemia associated  with type 2 diabetes mellitus ([place])   Hypertension associated with diabetes (HCC)   Hx of CABG   GERD   CLL (chronic lymphocytic leukemia) (HCC)   Acute kidney injury superimposed on chronic kidney disease (HCC)   CAD (coronary artery disease)   WBAT with walker DVT ppx: ASA + Plavix, SCDs, TEDS PO pain control PT/OT Dispo: D/C planning   Hilton Cork Aleira Deiter 08/29/2018, 10:05 AM   Rod Can, MD Cell: [phone] [place] is now [facility]  [place] [address], [address], [place], [place] [address] Phone: [phone] [email or web address] Facebook  [employer]

## 2018-08-29 NOTE — Evaluation (Signed)
Physical Therapy Evaluation Patient Details Name: [name] MRN: [id number] DOB: 21-Aug-1940 Today's Date: 08/29/2018   History of Present Illness  78 y o female sustained a fall resulting in L femoral fx:  s/p ORIF.  PMH:  CLL, CAD, MI, CABG, DM  Clinical Impression  Patient is s/p above surgery resulting in functional limitations due to the deficits listed below (see PT Problem List).  Patient will benefit from skilled PT to increase their independence and safety with mobility to allow discharge to the venue listed below.  Pt reports receiving pain meds prior to arrival how feels they are not working.  Pt with slow effortful mobility and only able to tolerate assist from recliner back to bed.  Pt requiring increased assist and presents with decreased mobility so recommend SNF at this time.     Follow Up Recommendations SNF    Equipment Recommendations  Rolling walker with 5" wheels    Recommendations for Other Services       Precautions / Restrictions Precautions Precautions: Fall Restrictions LLE Weight Bearing: Weight bearing as tolerated      Mobility  Bed Mobility Overal bed mobility: Needs Assistance Bed Mobility: Sit to Supine       Sit to supine: Mod assist   General bed mobility comments: assist for LEs onto bed  Transfers Overall transfer level: Needs assistance Equipment used: Rolling walker (2 wheeled) Transfers: Sit to/from [facility] Sit to Stand: Mod assist;+2 physical assistance Stand pivot transfers: Mod assist;+2 physical assistance       General transfer comment: increased time required to position pt at edge of recliner due to stiff and painful L hip; verbal cues for UE and LE positioning, pt performing slowly and assisting as able  Ambulation/Gait             General Gait Details: pt not able to tolerate at this time  Stairs            Wheelchair Mobility    Modified Rankin (Stroke Patients Only)        Balance                                             Pertinent Vitals/Pain Pain Assessment: 0-10 Pain Score: 10-Worst pain ever Pain Location: groin with any movement Pain Descriptors / Indicators: Tightness;Sore;Crying;Grimacing Pain Intervention(s): Premedicated before session;Repositioned;Monitored during session    Home Living Family/patient expects to be discharged to:: Skilled nursing facility Living Arrangements: Alone                    Prior Function Level of Independence: Independent         Comments: including home management, yard work     Hand Dominance   Dominant Hand: Right    Extremity/Trunk Assessment        Lower Extremity Assessment Lower Extremity Assessment: LLE deficits/detail LLE Deficits / Details: increased pain with any movement; able to perform ankle pumps LLE: Unable to fully assess due to pain       Communication   Communication: No difficulties  Cognition Arousal/Alertness: Awake/alert Behavior During Therapy: WFL for tasks assessed/performed Overall Cognitive Status: Within Functional Limits for tasks assessed  General Comments      Exercises     Assessment/Plan    PT Assessment Patient needs continued PT services  PT Problem List Decreased activity tolerance;Decreased knowledge of use of DME;Decreased balance;Decreased strength;Decreased mobility;Pain       PT Treatment Interventions DME instruction;Therapeutic exercise;Gait training;Balance training;Functional mobility training;Therapeutic activities;Patient/family education    PT Goals (Current goals can be found in the Care Plan section)  Acute Rehab PT Goals PT Goal Formulation: With patient Time For Goal Achievement: 09/12/18 Potential to Achieve Goals: Good    Frequency Min 3X/week   Barriers to discharge        Co-evaluation               AM-PAC PT "6 Clicks"  Mobility  Outcome Measure Help needed turning from your back to your side while in a flat bed without using bedrails?: A Lot Help needed moving from lying on your back to sitting on the side of a flat bed without using bedrails?: A Lot Help needed moving to and from a bed to a chair (including a wheelchair)?: A Lot Help needed standing up from a chair using your arms (e.g., wheelchair or bedside chair)?: A Lot Help needed to walk in hospital room?: Total Help needed climbing 3-5 steps with a railing? : Total 6 Click Score: 10    End of Session Equipment Utilized During Treatment: Gait belt Activity Tolerance: Patient limited by pain Patient left: in bed;with nursing/sitter in room;with call bell/phone within reach;with bed alarm set   PT Visit Diagnosis: Other abnormalities of gait and mobility (R26.89)    Time: [id number] PT Time Calculation (min) (ACUTE ONLY): 18 min   Charges:   PT Evaluation $PT Eval Low Complexity: [place], PT, DPT Acute Rehabilitation Services Office: [phone] Pager: [phone]  Trena Platt 08/29/2018, 1:33 PM

## 2018-08-29 NOTE — TOC Initial Note (Signed)
Transition of Care [facility]) - Initial/Assessment Note    Patient Details  Name: [name] MRN: [id number] Date of Birth: [birth year]  Transition of Care [facility]) CM/SW Contact:    Dessa Phi, RN Phone Number: 08/29/2018, 2:48 PM  Clinical Narrative: Spoke to patient in rm about d/c plans, & PT recc-SNF-patient agree t SNF, Son [name] also in agreement on phone for SNF. Faxed out-await bed offers.                  Expected Discharge Plan: Skilled Nursing Facility Barriers to Discharge: Continued Medical Work up   Patient Goals and CMS Choice Patient states their goals for this hospitalization and ongoing recovery are:: get stronger CMS Medicare.gov Compare Post Acute Care list provided to:: Patient Choice offered to / list presented to : Patient  Expected Discharge Plan and Services Expected Discharge Plan: [place] Choice: [place] Living arrangements for the past 2 months: Single Family Home Expected Discharge Date: 09/02/18                                    Prior Living Arrangements/Services Living arrangements for the past 2 months: Single Family Home Lives with:: Self Patient language and need for interpreter reviewed:: Yes Do you feel safe going back to the place where you live?: Yes      Need for Family Participation in Patient Care: No (Comment) Care giver support system in place?: Yes (comment)   Criminal Activity/Legal Involvement Pertinent to Current Situation/Hospitalization: No - Comment as needed  Activities of Daily Living Home Assistive Devices/Equipment: CBG Meter, Eyeglasses ADL Screening (condition at time of admission) Patient's cognitive ability adequate to safely complete daily activities?: Yes Is the patient deaf or have difficulty hearing?: No Does the patient have difficulty seeing, even when wearing glasses/contacts?: No Does the patient have difficulty concentrating, remembering,  or making decisions?: No Patient able to express need for assistance with ADLs?: Yes Does the patient have difficulty dressing or bathing?: Yes Independently performs ADLs?: No Communication: Needs assistance Is this a change from baseline?: Change from baseline, expected to last <3 days Dressing (OT): Needs assistance Is this a change from baseline?: Change from baseline, expected to last <3days Grooming: Needs assistance Is this a change from baseline?: Change from baseline, expected to last <3 days Feeding: Independent with device (comment) Bathing: Needs assistance Is this a change from baseline?: Change from baseline, expected to last <3 days Toileting: Needs assistance Is this a change from baseline?: Change from baseline, expected to last <3 days In/Out Bed: Needs assistance Is this a change from baseline?: Change from baseline, expected to last <3 days Walks in Home: Needs assistance Is this a change from baseline?: Change from baseline, expected to last <3 days Does the patient have difficulty walking or climbing stairs?: Yes Weakness of Legs: Left Weakness of Arms/Hands: None  Permission Sought/Granted Permission sought to share information with : Case Manager Permission granted to share information with : Yes, Verbal Permission Granted  Share Information with NAME: [name] granted to share info w AGENCY: SNF  Permission granted to share info w Relationship: Son  Permission granted to share info w Contact Information: [phone]  Emotional Assessment Appearance:: Appears stated age Attitude/Demeanor/Rapport: Gracious   Orientation: : Oriented to Self, Oriented to Place, Oriented to  Time, Oriented to Situation Alcohol /  Substance Use: Never Used Psych Involvement: No (comment)  Admission diagnosis:  Closed nondisplaced intertrochanteric fracture of left femur, initial encounter [facility]) [S72.145A] Patient Active Problem List   Diagnosis Date Noted   . Intertrochanteric fracture of left femur, closed, initial encounter ([place]) 08/27/2018  . Acute kidney injury superimposed on chronic kidney disease ([place]) 08/27/2018  . CAD (coronary artery disease)   . Chest pain 04/19/2017  . IDA (iron deficiency anemia) 08/30/2016  . DOE (dyspnea on exertion) 04/19/2016  . Positive cardiac stress test   . PCP NOTES >>>>>>>>>>>>>>>>>>> 12/02/2014  . Annual physical exam  08/30/2014  . 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 CLL (chronic lymphocytic leukemia) ([place]) 02/25/2014  . Chronic kidney disease, stage III (moderate) ([place]) 09/18/2013  . Anxiety and depression 10/03/2010  . IBS (irritable bowel syndrome) 09/01/2010  . Vitamin B deficiency 05/20/2009  . HERNIA, VENTRAL 08/27/2008  . GASTRITIS, HX OF 08/27/2008  . 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 COLONIC POLYPS 12/20/2006  . VENOUS INSUFFICIENCY 12/20/2006  . Insulin dependent diabetes mellitus with complications ([place]) [id number]  . Hyperlipidemia associated with type 2 diabetes mellitus ([place]) 10/16/2006  . Hypertension associated with diabetes ([place]) 10/16/2006  . Hx of CABG 10/16/2006  . GERD 10/16/2006  . 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 SHINGLES, HX OF 10/16/2006   PCP:  Colon Branch, MD Pharmacy:   [facility] [place], [place] [address] [place] [address] Phone: [phone] Fax: [phone]     Social Determinants of Health (SDOH) Interventions    Readmission Risk Interventions No flowsheet data found.

## 2018-08-29 NOTE — Evaluation (Signed)
Clinical/Bedside Swallow Evaluation Patient Details  Name: [name] MRN: [id number] Date of Birth: 1940/01/28  Today's Date: 08/29/2018 Time: SLP Start Time (ACUTE ONLY): 1435 SLP Stop Time (ACUTE ONLY): 1500 SLP Time Calculation (min) (ACUTE ONLY): 25 min  Past Medical History:  Past Medical History:  Diagnosis Date  . Acute blood loss anemia 09/17/2015  . Allergy   . Anemia   . Anginal pain ([place]) 2017  . Anxiety   . Arthritis    "hands and knees mostly" (09/16/2015)  . B12 deficiency anemia   . CAD (coronary artery disease)    Dr Gwenlyn Found  . Cataract    bil cataracts removed  . CLL (chronic lymphocytic leukemia) ([place]) 02/25/2014  . Clotting disorder ([place])   . Depression   . DJD (degenerative joint disease)   . 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 Gastritis   . GERD (gastroesophageal reflux disease)   . Headache(784.0)   . History of blood transfusion    "I"ve had 20 some; thru birth of children, loss of blood, last 2 were in ~ 1999 before my cancer surgery" (09/16/2015)  . History of hiatal hernia   . History of shingles   . Hx of colonic polyps   . Hyperlipidemia   . Hypertension   . 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 Myocardial infarction ([place])    1997  . Pneumonia    X 1  . Postoperative hematoma involving circulatory system following cardiac catheterization 09/17/2015  . S/P cardiac cath 09/16/15 09/17/2015  . Type II diabetes mellitus ([place])   . 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 Venous insufficiency    Past Surgical History:  Past Surgical History:  Procedure Laterality Date  . CARDIAC CATHETERIZATION  1997; 2008; 09/16/2015  . CARDIAC CATHETERIZATION N/A 09/16/2015   Procedure: Right/Left Heart Cath and Coronary/Graft Angiography;  Surgeon: Lorretta Harp, MD;  Location: [facility];  Service: Cardiovascular;  Laterality: N/A;  . CESAREAN SECTION  1966  . COLONOSCOPY    . CORONARY ARTERY  BYPASS GRAFT  1997   CABG X5  . EYE SURGERY     2 laser surgeries on left eye with implant  . EYE SURGERY Bilateral    cataracts  . FRACTURE SURGERY    . HERNIA REPAIR    . LAPAROSCOPIC ASSISTED VENTRAL HERNIA REPAIR  09/2008    with incarcerated colon;  Dr. Lucia Gaskins  . MOHS SURGERY  06/2016   [facility]  . PATELLA FRACTURE SURGERY Left 11/1994   "crushed my knee"; put in a plate & 6 screws"  . POLYPECTOMY    . REFRACTIVE SURGERY Left 07/30/2015  . 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 March Rummage  . SHOULDER ARTHROSCOPY W/ ROTATOR CUFF REPAIR Right 1997  . SMALL INTESTINE SURGERY    . TONSILLECTOMY  1960s  . TOTAL KNEE ARTHROPLASTY WITH HARDWARE REMOVAL Left 12/1994    (infex, hardware removed )  . TUBAL LIGATION  1966   HPI:  Patient is a 12 y o female sustained a fall resulting in L femoral fx:  s/p ORIF.  PMH:  CLL, CAD, MI, CABG, DM. She reported to MD that she has some difficulty swallowing.   Assessment / Plan / Recommendation Clinical Impression  Patient presents with an oropharyngeal swallow that is WNL without overt s/s of aspiration or penetration. 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 She describes globus sensation with solids, occasional nausea, sometimes with vomitting after eating. She stated that she has been followed by GI MD and takes 20mg  of a PPI (had been decreased from 40mg  but patient did not know why). SLP recommended patient follow up with her PCP and GI to discuss her symptoms and determine need for medication or other management. SLP Visit Diagnosis: Dysphagia, pharyngoesophageal phase (R13.14)    Aspiration Risk  No limitations    Diet Recommendation Regular;Thin liquid   Liquid Administration via: Cup;Straw Medication Administration: Whole meds with liquid Supervision: Patient able to self feed Postural Changes: Seated upright at 90 degrees    Other  Recommendations Oral Care  Recommendations: Oral care BID;Patient independent with oral care   Follow up Recommendations None      Frequency and Duration   N/A         Prognosis   N/A     Swallow Study   General Date of Onset: 08/28/18 HPI: Patient is a 4 y o female sustained a fall resulting in L femoral fx:  s/p ORIF.  PMH:  CLL, CAD, MI, CABG, DM. She reported to MD that she has some difficulty swallowing. Type of Study: Bedside Swallow Evaluation Previous Swallow Assessment: N/A Diet Prior to this Study: Regular;Thin liquids Temperature Spikes Noted: No Respiratory Status: Room air History of Recent Intubation: Yes Length of Intubations (days): 1 days Date extubated: 08/28/18(intubated for surgery only) Behavior/Cognition: Alert;Pleasant mood Oral Cavity Assessment: Within Functional Limits Oral Care Completed by SLP: No Vision: Functional for self-feeding Self-Feeding Abilities: Able to feed self Patient Positioning: Upright in bed Baseline Vocal Quality: Hoarse Volitional Cough: Strong Volitional Swallow: Able to elicit    Oral/Motor/Sensory Function Overall Oral Motor/Sensory Function: Within functional limits   Ice Chips     Thin Liquid Thin Liquid: Within functional limits Other Comments: No overt s/s of aspiration or penetration with thin liquids via straw sips.    Nectar Thick     Honey Thick     Puree Puree: Not tested   Solid     Solid: Not tested Other Comments: Patient reports h/o feeling that solids are getting lodged in throat or not moving well through upper esophagus, but this is not a new complaint.      [name] 08/29/2018,3:16 PM  Sara Baller, MA, CCC-SLP Speech Therapy [facility]

## 2018-08-29 NOTE — NC FL2 (Signed)
[place] MEDICAID FL2 LEVEL OF CARE SCREENING TOOL     IDENTIFICATION  Patient Name: [name] Birthdate: May 23, 1940 Sex: female Admission Date (Current Location): 08/27/2018  [facility] and [place] Number:  Herbalist and Address:  [facility],  [place], [place]      Provider Number: [id number]  Attending Physician Name and Address:  Dessa Phi, DO  Relative Name and Phone Number:  Si Raider. [place]    Current Level of Care: Hospital Recommended Level of Care: [place] Prior Approval Number:    Date Approved/Denied:   PASRR Number: [id number]  Discharge Plan: SNF    Current Diagnoses: Patient Active Problem List   Diagnosis Date Noted  . Intertrochanteric fracture of left femur, closed, initial encounter ([place]) 08/27/2018  . Acute kidney injury superimposed on chronic kidney disease ([place]) 08/27/2018  . CAD (coronary artery disease)   . Chest pain 04/19/2017  . IDA (iron deficiency anemia) 08/30/2016  . DOE (dyspnea on exertion) 04/19/2016  . Positive cardiac stress test   . PCP NOTES >>>>>>>>>>>>>>>>>>> 12/02/2014  . Annual physical exam  08/30/2014  . 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 CLL (chronic lymphocytic leukemia) ([place]) 02/25/2014  . Chronic kidney disease, stage III (moderate) ([place]) 09/18/2013  . Anxiety and depression 10/03/2010  . IBS (irritable bowel syndrome) 09/01/2010  . Vitamin B deficiency 05/20/2009  . HERNIA, VENTRAL 08/27/2008  . GASTRITIS, HX OF 08/27/2008  . 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 COLONIC POLYPS 12/20/2006  . VENOUS INSUFFICIENCY 12/20/2006  . Insulin dependent diabetes mellitus with complications ([place]) [id number]  . Hyperlipidemia associated with type 2 diabetes mellitus ([place]) 10/16/2006  . Hypertension associated with diabetes ([place]) 10/16/2006  . Hx of CABG 10/16/2006  . GERD 10/16/2006  . DJD ,  hydrocodone, UDS 10/16/2006  . SHINGLES, HX OF 10/16/2006    Orientation RESPIRATION BLADDER Height & Weight     Self, Time, Situation, Place  Normal Continent Weight: 80.7 kg Height:  5\' 6"  (167.6 cm)  BEHAVIORAL SYMPTOMS/MOOD NEUROLOGICAL BOWEL NUTRITION STATUS      Continent Diet(Carb modified)  AMBULATORY STATUS COMMUNICATION OF NEEDS Skin   Limited Assist Verbally Skin abrasions, Other (Comment)(L elbow abrasion-pink foam;L hip dsg)                       Personal Care Assistance Level of Assistance  Bathing, Feeding, Dressing Bathing Assistance: Limited assistance Feeding assistance: Limited assistance Dressing Assistance: Limited assistance     Functional Limitations Info  Sight(reading eyeglasses) Sight Info: Impaired(reading eyeglasses)   Speech Info: Impaired(upper, & lower dentures.)    SPECIAL CARE FACTORS FREQUENCY  PT (By licensed PT), OT (By licensed OT)     PT Frequency: 5x week OT Frequency: 5x week            Contractures      Additional Factors Info  Code Status, Allergies, Insulin Sliding Scale Code Status Info: Partial Allergies Info: Codeine, Ibuprofen, Meperidine Hcl, Naproxen Sodium   Insulin Sliding Scale Info: SSI       Current Medications (08/29/2018):  This is the current hospital active medication list Current Facility-Administered Medications  Medication Dose Route Frequency Provider Last Rate Last Dose  . 0.9 %  sodium chloride infusion   Intravenous Continuous Dessa Phi, DO 100 mL/hr at 08/29/18 1148    . acetaminophen (TYLENOL) tablet 650 mg  650 mg Oral Q6H PRN Swinteck, Aaron Edelman,  MD      . amLODipine (NORVASC) tablet 5 mg  5 mg Oral Daily Dessa Phi, DO      . aspirin chewable tablet 81 mg  81 mg Oral BID WC Rod Can, MD   81 mg at 08/29/18 [id number]  . atenolol (TENORMIN) tablet 100 mg  100 mg Oral Daily Rod Can, MD   100 mg at 08/29/18 [id number]  . atorvastatin (LIPITOR) tablet 20 mg  20 mg Oral Daily  Rod Can, MD   20 mg at 08/29/18 [id number]  . clopidogrel (PLAVIX) tablet 75 mg  75 mg Oral Daily Rod Can, MD   75 mg at 08/29/18 [id number]  . clorazepate (TRANXENE) tablet 7.5 mg  7.5 mg Oral QHS PRN Swinteck, Aaron Edelman, MD      . docusate sodium (COLACE) capsule 100 mg  100 mg Oral BID Rod Can, MD   100 mg at 08/29/18 0928  . HYDROcodone-acetaminophen (NORCO/VICODIN) 5-325 MG per tablet 1-2 tablet  1-2 tablet Oral Q4H PRN Dessa Phi, DO   2 tablet at 08/29/18 1350  . HYDROmorphone (DILAUDID) injection 0.5 mg  0.5 mg Intravenous Q3H PRN Rod Can, MD   0.5 mg at 08/29/18 1138  . insulin aspart (novoLOG) injection 0-9 Units  0-9 Units Subcutaneous TID WC Dessa Phi, DO      . insulin NPH Human (NOVOLIN N) injection 20 Units  20 Units Subcutaneous QAC breakfast Dessa Phi, DO      . isosorbide mononitrate (IMDUR) 24 hr tablet 30 mg  30 mg Oral Daily Rod Can, MD   30 mg at 08/29/18 [id number]  . menthol-cetylpyridinium (CEPACOL) lozenge 3 mg  1 lozenge Oral PRN Swinteck, Aaron Edelman, MD       Or  . phenol (CHLORASEPTIC) mouth spray 1 spray  1 spray Mouth/Throat PRN Swinteck, Aaron Edelman, MD      . metoCLOPramide (REGLAN) tablet 5-10 mg  5-10 mg Oral Q8H PRN Swinteck, Aaron Edelman, MD       Or  . metoCLOPramide (REGLAN) injection 5-10 mg  5-10 mg Intravenous Q8H PRN Swinteck, Aaron Edelman, MD      . mupirocin ointment (BACTROBAN) 2 % 1 application  1 application Nasal BID Rod Can, MD   1 application at [id number] [phone]  . ondansetron (ZOFRAN) tablet 4 mg  4 mg Oral Q6H PRN Swinteck, Aaron Edelman, MD       Or  . ondansetron [facility]) injection 4 mg  4 mg Intravenous Q6H PRN Rod Can, MD   4 mg at 08/29/18 [id number]  . pantoprazole (PROTONIX) EC tablet 40 mg  40 mg Oral Daily Rod Can, MD   40 mg at 08/29/18 [id number]  . senna (SENOKOT) tablet 8.6 mg  1 tablet Oral BID Rod Can, MD   8.6 mg at 08/29/18 [id number]  . senna-docusate (Senokot-S) tablet 1 tablet  1 tablet Oral QHS PRN Rod Can, MD         Discharge Medications: Please see discharge summary for a list of discharge medications.  Relevant Imaging Results:  Relevant Lab Results:   Additional Information [id number]  Dessa Phi, RN

## 2018-08-29 NOTE — Telephone Encounter (Signed)
Noted, please have patient call to schedule follow-up sometime in about a month or so

## 2018-08-29 NOTE — Evaluation (Signed)
Occupational Therapy Evaluation Patient Details Name: [name] MRN: [id number] DOB: 1940-10-09 Today's Date: 08/29/2018    History of Present Illness 78 y o female sustained a fall resulting in L femoral fx:  s/p ORIF.  PMH:  CLL, CAD, MI, CABG, DM   Clinical Impression   Pt was admitted for the above injury.  She lives alone and is very independent at baseline, including yard work.  Pt was limited by pain at time of evaluation and RN is working to try to get medication changed.  She needs total +2 assistance for LB adls at this time. Will follow in acute setting with min A level goals.     Follow Up Recommendations  SNF    Equipment Recommendations  3 in 1 bedside commode    Recommendations for Other Services       Precautions / Restrictions Precautions Precautions: Fall Restrictions LLE Weight Bearing: Weight bearing as tolerated      Mobility Bed Mobility               General bed mobility comments: oob  Transfers                 General transfer comment: did not stand; up in chair with +2 nursing assistance early in am    Balance                                           ADL either performed or assessed with clinical judgement   ADL Overall ADL's : Needs assistance/impaired Eating/Feeding: Independent   Grooming: Set up;Wash/dry hands;Wash/dry face;Sitting   Upper Body Bathing: Set up;Sitting   Lower Body Bathing: Total assistance;+2 for physical assistance   Upper Body Dressing : Minimal assistance   Lower Body Dressing: Total assistance;+2 for physical assistance                 General ADL Comments: ADL completed from chair as pt was OOB.  She was unable to tolerate getting feet on floor; any movement shot pain up to 10. Educated on use of AE to increase independence with adls     Vision         Perception     Praxis      Pertinent Vitals/Pain Pain Assessment: 0-10 Pain Score: 10-Worst pain  ever Pain Location: groin with any movement; 5 at rest Pain Descriptors / Indicators: Aching Pain Intervention(s): Limited activity within patient's tolerance;Monitored during session;Repositioned;Ice applied(pain pills not working per [employer]. Got IV earlier)     Hand Dominance Right   Extremity/Trunk Assessment Upper Extremity Assessment Upper Extremity Assessment: Generalized weakness;LUE deficits/detail LUE Deficits / Details: sore from fall; ROM wfls           Communication Communication Communication: No difficulties   Cognition Arousal/Alertness: Awake/alert Behavior During Therapy: WFL for tasks assessed/performed Overall Cognitive Status: Within Functional Limits for tasks assessed                                     General Comments       Exercises     Shoulder Instructions      Home Living Family/patient expects to be discharged to:: Skilled nursing facility Living Arrangements: Alone  Prior Functioning/Environment Level of Independence: Independent        Comments: including home management, yard work        OT Problem List: Decreased strength;Decreased activity tolerance;Impaired balance (sitting and/or standing);Pain;Decreased knowledge of use of DME or AE;Decreased knowledge of precautions      OT Treatment/Interventions: Self-care/ADL training;DME and/or AE instruction;Therapeutic activities;Patient/family education;Balance training    OT Goals(Current goals can be found in the care plan section) Acute Rehab OT Goals Patient Stated Goal: less pain and return to independence OT Goal Formulation: With patient Time For Goal Achievement: 09/12/18 Potential to Achieve Goals: Good ADL Goals Pt Will Transfer to Toilet: with min assist;bedside commode;stand pivot transfer Pt Will Perform Toileting - Clothing Manipulation and hygiene: with min assist;sit to/from stand Additional ADL Goal  #1: pt will perform LB bathing with min A and LB dressing with mod A with ae, sit to stand  OT Frequency: Min 2X/week   Barriers to D/C:            Co-evaluation              AM-PAC OT "6 Clicks" Daily Activity     Outcome Measure Help from another person eating meals?: None Help from another person taking care of personal grooming?: A Little Help from another person toileting, which includes using toliet, bedpan, or urinal?: Total Help from another person bathing (including washing, rinsing, drying)?: A Lot Help from another person to put on and taking off regular upper body clothing?: A Little Help from another person to put on and taking off regular lower body clothing?: Total 6 Click Score: 14   End of Session    Activity Tolerance: Patient tolerated treatment well Patient left: in chair;with call bell/phone within reach  OT Visit Diagnosis: Muscle weakness (generalized) (M62.81);Pain Pain - Right/Left: Left Pain - part of body: Hip(and groin)                Time: [id number] OT Time Calculation (min): 15 min Charges:  OT General Charges $OT Visit: 1 Visit OT Evaluation $OT Eval Low Complexity: [place], OTR/L Acute Rehabilitation Services [phone] [facility] pager [phone] office 08/29/2018  [place] 08/29/2018, 8:51 AM

## 2018-08-29 NOTE — Telephone Encounter (Signed)
Pt daughter in law called to inform that pt is in [facility] in room 1418 broken hip pt fell at surgery yesterday.

## 2018-08-29 NOTE — Progress Notes (Signed)
PROGRESS NOTE    [name]  [id number] DOB: Nov 30, 1940 DOA: 08/27/2018 PCP: Colon Branch, MD     Brief Narrative:  From H&P by Dr. Posey Pronto: [name] is a 78 y.o. 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 Patient states she was in her usual state of health today.  She was outside working in the yard from 2 PM to 8 PM.  After finishing up she was backing her car up.  She walked out of her car was pouring a cup of tea out when she stumbled outside and fell onto the ground with her hip landing on the cement.  She has significant pain and was unable to stand on her own power or move her left lower extremity much. She denies any injury to her head or loss of consciousness. Patient does report about 4-5 episodes of diarrhea day prior to admission which occurred after eating a salmon patty and pinto beans.  She says she has not had any further episodes. In the ED, work up revealed nondisplaced left femoral intertrochanteric fracture. Orthopedic surgery consulted.  Patient underwent left ORIF 8/26 with Dr. Lyla Glassing.  New events last 24 hours / Subjective: Multiple complaints today.  Complains of difficulty swallowing, this is a chronic problem which patient states that has been going on "a while."  She also complains of left anterior chest wall pain that radiates under her left armpit.  Upon questioning, this is also been going on "a while" and had previously been brought up to her PCP as well as cardiologist.  She states that after the fall, the pain has worsened.  Also complains of ongoing left hip pain.  States that oxycodone does not help.  She takes hydrocodone at home as needed.  Also asking about catheter or at least a pure wick placement due to her decrease in  mobility postoperatively  Assessment & Plan:   Principal Problem:   Intertrochanteric fracture of left femur, closed, initial encounter ([place]) Active Problems:   Insulin dependent diabetes mellitus with complications ([place])   Hyperlipidemia associated with type 2 diabetes mellitus ([place])   Hypertension associated with diabetes ([place])   Hx of CABG   GERD   CLL (chronic lymphocytic leukemia) ([place])   Acute kidney injury superimposed on chronic kidney disease ([place])   CAD (coronary artery disease)   Left hip fracture -After mechanical fall -Status post ORIF 8/26.  Orthopedic surgery following -PT OT consulted, recommending SNF placement at this time  AKI on CKD stage III -Likely prerenal in setting of GI volume loss -Baseline creatinine around 1.3-1.7 -Continue IV fluids 1 more day -Hold irbesartan, metformin  Mild hyperkalemia -Lokelma ordered today   CAD status post CABG -Resume aspirin, Plavix.  Continue Imdur, atorvastatin  CLL -Follows with Dr. Marin Olp, oncology.  She has a follow-up appointment on 9/4  Insulin-dependent type 2 diabetes, well controlled -Hemoglobin A1c 6.2 -Resume sliding scale insulin and Novolin N qAM   Essential hypertension -Continue atenolol, amlodipine.  Hold irbesartan  Hyperlipidemia -Continue atorvastatin  Dysphagia -SLP consulted  Chronic left-sided chest wall pain -Will obtain dedicated rib x-rays to rule out fractures in setting of a fall         DVT prophylaxis: SCD Code Status: DNI Family Communication: None Disposition  Plan: SNF placement    Consultants:   Orthopedic surgery  Procedures:   None  Antimicrobials:  Anti-infectives (From admission, onward)   Start     Dose/Rate Route Frequency Ordered Stop   08/28/18 2200  vancomycin (VANCOCIN) IVPB 1000 mg/200 mL premix     1,000 mg 200 mL/hr over 60 Minutes Intravenous Every 12 hours 08/28/18 1453 08/29/18 0809   08/28/18 1045  vancomycin (VANCOCIN) IVPB 1000  mg/200 mL premix     1,000 mg 200 mL/hr over 60 Minutes Intravenous  Once 08/28/18 1038 08/28/18 1453   08/28/18 1030  ceFAZolin (ANCEF) IVPB 2g/100 mL premix     2 g 200 mL/hr over 30 Minutes Intravenous On call to O.R. 08/28/18 1029 08/28/18 1205       Objective: Vitals:   08/28/18 1736 08/28/18 2011 08/29/18 0013 08/29/18 0350  BP: 112/69 115/66 (!) 107/51 (!) 116/57  Pulse: 67 66 66 70  Resp: 16 18 18 18   Temp: (!) 97.4 F (36.3 C) 97.7 F (36.5 C) 97.9 F (36.6 C) 97.8 F (36.6 C)  TempSrc: Oral  Oral Oral  SpO2: 98% 97% 94% 99%  Weight:      Height:        Intake/Output Summary (Last 24 hours) at 08/29/2018 1232 Last data filed at 08/29/2018 0400 Gross per 24 hour  Intake 2888.47 ml  Output 400 ml  Net 2488.47 ml   Filed Weights   08/28/18 0207 08/28/18 1015  Weight: 80.7 kg 80.7 kg    Examination: General exam: Appears calm and comfortable  Respiratory system: Clear to auscultation. Respiratory effort normal. Cardiovascular system: S1 & S2 heard, RRR. No JVD, murmurs, rubs, gallops or clicks. No pedal edema.  Reproducible left anterior chest wall pain Gastrointestinal system: Abdomen is nondistended, soft and nontender. No organomegaly or masses felt. Normal bowel sounds heard. Central nervous system: Alert and oriented. No focal neurological deficits. Extremities: Symmetric 5 x 5 power. Skin: No rashes, lesions or ulcers Psychiatry: Judgement and insight appear normal. Mood & affect appropriate.     Data Reviewed: I have personally reviewed following labs and imaging studies  CBC: Recent Labs  Lab 08/27/18 2210 08/28/18 0526 08/28/18 1048 08/29/18 0432  WBC 58.3* 60.1*  --  57.3*  NEUTROABS 27.4*  --   --   --   HGB 10.9* 9.9* 10.5* 8.4*  HCT 34.9* 32.4* 31.0* 27.4*  MCV 98.6 100.9*  --  99.6  PLT 178 167  --  [id number]   Basic Metabolic Panel: Recent Labs  Lab 08/27/18 2210 08/28/18 0526 08/28/18 1048 08/29/18 0432  NA 142 140 142 137  K 4.4  5.7* 4.2 5.3*  CL 110 111  --  110  CO2 23 21*  --  18*  GLUCOSE 94 112* 120* 227*  BUN 32* 29*  --  30*  CREATININE 2.38* 1.74*  --  1.71*  CALCIUM 9.5 8.8*  --  7.5*   GFR: Estimated Creatinine Clearance: 29.1 mL/min (A) (by C-G formula based on SCr of 1.71 mg/dL (H)). Liver Function Tests: No results for input(s): AST, ALT, ALKPHOS, BILITOT, PROT, ALBUMIN in the last 168 hours. No results for input(s): LIPASE, AMYLASE in the last 168 hours. No results for input(s): AMMONIA in the last 168 hours. Coagulation Profile: Recent Labs  Lab 08/27/18 2210  INR 1.1   Cardiac Enzymes: No results for input(s): CKTOTAL, CKMB, CKMBINDEX, TROPONINI in the last 168 hours. BNP (last 3 results) No results for input(s): PROBNP in the  last 8760 hours. HbA1C: Recent Labs    08/27/18 2210  HGBA1C 6.1*   CBG: Recent Labs  Lab 08/28/18 2011 08/29/18 0011 08/29/18 0402 08/29/18 0759 08/29/18 1140  GLUCAP 232* 254* 199* 197* 184*   Lipid Profile: No results for input(s): CHOL, HDL, LDLCALC, TRIG, CHOLHDL, LDLDIRECT in the last 72 hours. Thyroid Function Tests: No results for input(s): TSH, T4TOTAL, FREET4, T3FREE, THYROIDAB in the last 72 hours. Anemia Panel: No results for input(s): VITAMINB12, FOLATE, FERRITIN, TIBC, IRON, RETICCTPCT in the last 72 hours. Sepsis Labs: No results for input(s): PROCALCITON, LATICACIDVEN in the last 168 hours.  Recent Results (from the past 240 hour(s))  SARS CORONAVIRUS 2 (TAT 6-12 HRS)     Status: None   Collection Time: 08/27/18 10:42 PM  Result Value Ref Range Status   SARS Coronavirus 2 NEGATIVE NEGATIVE Final    Comment: (NOTE) SARS-CoV-2 target nucleic acids are NOT DETECTED. The SARS-CoV-2 RNA is generally detectable in upper and lower respiratory specimens during the acute phase of infection. Negative results do not preclude SARS-CoV-2 infection, do not rule out co-infections with other pathogens, and should not be used as the sole basis  for treatment or other patient management decisions. Negative results must be combined with clinical observations, patient history, and epidemiological information. The expected result is Negative. Fact Sheet for Patients: SugarRoll.be Fact Sheet for Healthcare Providers: https://www.woods-mathews.com/ This test is not yet approved or cleared by the [place] FDA and  has been authorized for detection and/or diagnosis of SARS-CoV-2 by FDA under an Emergency Use Authorization (EUA). This EUA will remain  in effect (meaning this test can be used) for the duration of the COVID-19 declaration under Section 56 4(b)(1) of the Act, 21 U.S.C. section 360bbb-3(b)(1), unless the authorization is terminated or revoked sooner. Performed at [facility], [place] [address] [place], [place] [address]   MRSA PCR Screening     Status: Abnormal   Collection Time: 08/28/18  2:33 AM   Specimen: Nasal Mucosa; Nasopharyngeal  Result Value Ref Range Status   MRSA by PCR POSITIVE (A) NEGATIVE Final    Comment:        The GeneXpert MRSA Assay (FDA approved for NASAL specimens only), is one component of a comprehensive MRSA colonization surveillance program. It is not intended to diagnose MRSA infection nor to guide or monitor treatment for MRSA infections. RESULT CALLED TO, READ BACK BY AND VERIFIED WITH: CARTER,K @ [id number] ON [id number] BY POTEAT,S Performed at [facility] [address] [place], [place] [address]       Radiology Studies: Dg Chest 1 View  Result Date: 08/27/2018 CLINICAL DATA:  Hip fracture.  Fall. EXAM: CHEST  1 VIEW COMPARISON:  07/25/2017 FINDINGS: Prior CABG. Scarring in the lingula. Heart is borderline in size. Right lung clear. No effusions. No acute bony abnormality. IMPRESSION: Prior CABG.  Lingular scarring.  No active disease. Electronically Signed   By: Rolm Baptise M.D.   On: 08/27/2018 22:30   Pelvis  Portable  Result Date: 08/28/2018 CLINICAL DATA:  Left hip intramedullary nail placement EXAM: PORTABLE PELVIS 1-2 VIEWS COMPARISON:  Radiographs August 27, 2018 FINDINGS: Interval placement of a proximal left femoral intramedullary nail with transcervical cancellous screw which transfixes the intertrochanteric femur fracture seen on prior radiographs. No new fracture is evident.There are expected postsurgical changes over the hip with intra articular gas. Abdominal hernia mesh anchors are noted in the abdomen. Remaining soft tissues are unremarkable IMPRESSION: ORIF of an  intertrochanteric left femur fracture with placement of a left femoral intramedullary nail. Electronically Signed   By: Lovena Le M.D.   On: 08/28/2018 14:30   Dg C-arm 1-60 Min-no Report  Result Date: 08/28/2018 Fluoroscopy was utilized by the requesting physician.  No radiographic interpretation.   Dg Hip Unilat With Pelvis 2-3 Views Left  Result Date: 08/27/2018 CLINICAL DATA:  Fall, hip pain EXAM: DG HIP (WITH OR WITHOUT PELVIS) 2-3V LEFT COMPARISON:  None. FINDINGS: There is a left femoral intertrochanteric fracture. This is nondisplaced. No angulation. No subluxation or dislocation. IMPRESSION: No displaced left femoral intertrochanteric fracture. Electronically Signed   By: Rolm Baptise M.D.   On: 08/27/2018 22:31   Dg Femur Min 2 Views Left  Result Date: 08/28/2018 CLINICAL DATA:  Intertrochanteric fracture LEFT femur, ORIF EXAM: LEFT FEMUR 2 VIEWS COMPARISON:  08/27/2018 FLUOROSCOPY TIME:  1 minutes 17 seconds Images submitted: 4 FINDINGS: Osseous demineralization. Images demonstrate placement of an IM nail with a compression screw across a reduced intertrochanteric fracture of the proximal LEFT femur. No additional fracture dislocation seen. IMPRESSION: Post ORIF of intertrochanteric fracture LEFT femur. Electronically Signed   By: Lavonia Dana M.D.   On: 08/28/2018 12:57      Scheduled Meds: . aspirin  81 mg Oral  BID WC  . atenolol  100 mg Oral Daily  . atorvastatin  20 mg Oral Daily  . clopidogrel  75 mg Oral Daily  . docusate sodium  100 mg Oral BID  . insulin aspart  0-9 Units Subcutaneous Q4H  . isosorbide mononitrate  30 mg Oral Daily  . mupirocin ointment  1 application Nasal BID  . pantoprazole  40 mg Oral Daily  . senna  1 tablet Oral BID   Continuous Infusions: . sodium chloride 100 mL/hr at 08/29/18 1148     LOS: 2 days      Time spent: 40 minutes   Dessa Phi, DO [facility] [email or web address] 08/29/2018, 12:32 PM

## 2018-08-30 ENCOUNTER — Ambulatory Visit: Payer: Medicare Other | Admitting: Internal Medicine

## 2018-08-30 LAB — CBC
HCT: 26.7 % — ABNORMAL LOW (ref 36.0–46.0)
Hemoglobin: 8.2 g/dL — ABNORMAL LOW (ref 12.0–15.0)
MCH: 31.1 pg (ref 26.0–34.0)
MCHC: 30.7 g/dL (ref 30.0–36.0)
MCV: 101.1 fL — ABNORMAL HIGH (ref 80.0–100.0)
Platelets: 141 10*3/uL — ABNORMAL LOW (ref 150–400)
RBC: 2.64 MIL/uL — ABNORMAL LOW (ref 3.87–5.11)
RDW: 14.4 % (ref 11.5–15.5)
WBC: 48.7 10*3/uL — ABNORMAL HIGH (ref 4.0–10.5)
nRBC: 0 % (ref 0.0–0.2)

## 2018-08-30 LAB — BASIC METABOLIC PANEL
Anion gap: 6 (ref 5–15)
BUN: 34 mg/dL — ABNORMAL HIGH (ref 8–23)
CO2: 18 mmol/L — ABNORMAL LOW (ref 22–32)
Calcium: 7.7 mg/dL — ABNORMAL LOW (ref 8.9–10.3)
Chloride: 114 mmol/L — ABNORMAL HIGH (ref 98–111)
Creatinine, Ser: 1.57 mg/dL — ABNORMAL HIGH (ref 0.44–1.00)
GFR calc Af Amer: 36 mL/min — ABNORMAL LOW (ref >60)
GFR calc non Af Amer: 31 mL/min — ABNORMAL LOW (ref >60)
Glucose, Bld: 183 mg/dL — ABNORMAL HIGH (ref 70–99)
Potassium: 5.7 mmol/L — ABNORMAL HIGH (ref 3.5–5.1)
Sodium: 138 mmol/L (ref 135–145)

## 2018-08-30 LAB — GLUCOSE, CAPILLARY
Glucose-Capillary: 145 mg/dL — ABNORMAL HIGH (ref 70–99)
Glucose-Capillary: 159 mg/dL — ABNORMAL HIGH (ref 70–99)
Glucose-Capillary: 179 mg/dL — ABNORMAL HIGH (ref 70–99)
Glucose-Capillary: 189 mg/dL — ABNORMAL HIGH (ref 70–99)

## 2018-08-30 MED ORDER — FUROSEMIDE 10 MG/ML IJ SOLN
40.0000 mg | Freq: Once | INTRAMUSCULAR | Status: AC
  Administered 2018-08-30: 40 mg via INTRAVENOUS
  Filled 2018-08-30: qty 4

## 2018-08-30 MED ORDER — METHOCARBAMOL 500 MG PO TABS
750.0000 mg | ORAL_TABLET | Freq: Three times a day (TID) | ORAL | Status: DC | PRN
  Administered 2018-08-30: 750 mg via ORAL
  Filled 2018-08-30: qty 2

## 2018-08-30 MED ORDER — FENTANYL CITRATE (PF) 100 MCG/2ML IJ SOLN
25.0000 ug | INTRAMUSCULAR | Status: DC | PRN
  Administered 2018-08-30 (×2): 25 ug via INTRAVENOUS
  Filled 2018-08-30 (×2): qty 2

## 2018-08-30 NOTE — Progress Notes (Signed)
PROGRESS NOTE    [name]  [id number] DOB: 1940-07-19 DOA: 08/27/2018 PCP: Colon Branch, MD     Brief Narrative:  From H&P by Dr. Posey Pronto: [name] is a 78 y.o. 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 Patient states she was in her usual state of health today.  She was outside working in the yard from 2 PM to 8 PM.  After finishing up she was backing her car up.  She walked out of her car was pouring a cup of tea out when she stumbled outside and fell onto the ground with her hip landing on the cement.  She has significant pain and was unable to stand on her own power or move her left lower extremity much. She denies any injury to her head or loss of consciousness. Patient does report about 4-5 episodes of diarrhea day prior to admission which occurred after eating a salmon patty and pinto beans.  She says she has not had any further episodes. In the ED, work up revealed nondisplaced left femoral intertrochanteric fracture. Orthopedic surgery consulted.  Patient underwent left ORIF 8/26 with Dr. Lyla Glassing.  New events last 24 hours / Subjective: Complaints of left sided chest pain.  Attempting to use incentive spirometry today.  Assessment & Plan:   Principal Problem:   Intertrochanteric fracture of left femur, closed, initial encounter ([place]) Active Problems:   Insulin dependent diabetes mellitus with complications ([place])   Hyperlipidemia associated with type 2 diabetes mellitus ([place])   Hypertension associated with diabetes ([place])   Hx of CABG   GERD   CLL (chronic lymphocytic leukemia) ([place])   Acute kidney injury superimposed on chronic kidney disease ([place])   CAD (coronary artery disease)   Left hip fracture -After mechanical fall -Status post  ORIF 8/26.  Orthopedic surgery following -PT OT consulted, recommending SNF placement at this time  AKI on CKD stage III -Likely prerenal in setting of GI volume loss -Baseline creatinine around 1.3-1.7 -Hold irbesartan, metformin -Cr back to baseline 1.57 this morning  -Continue IVF due to oliguria   Mild hyperkalemia -Lasix IV ordered today  CAD status post CABG -Resume aspirin, Plavix.  Continue Imdur, atorvastatin  CLL -Follows with Dr. Marin Olp, oncology.  She has a follow-up appointment on 9/4  Insulin-dependent type 2 diabetes, well controlled -Hemoglobin A1c 6.2 -Sliding scale insulin and Novolin N qAM   Essential hypertension -Continue atenolol, amlodipine.  Hold irbesartan  Hyperlipidemia -Continue atorvastatin  Dysphagia -SLP consulted  Left 4th rib fracture -S/p mechanical fall -Incentive spirometry           DVT prophylaxis: SCD Code Status: DNI Family Communication: Discussed with daughter in law over the phone  Disposition Plan: SNF placement    Consultants:   Orthopedic surgery  Procedures:   None  Antimicrobials:  Anti-infectives (From admission, onward)   Start     Dose/Rate Route Frequency Ordered Stop   08/28/18 2200  vancomycin (VANCOCIN) IVPB 1000 mg/200 mL premix     1,000 mg 200 mL/hr over 60 Minutes Intravenous Every 12 hours 08/28/18 1453 08/29/18 0809   08/28/18 1045  vancomycin (VANCOCIN) IVPB 1000 mg/200 mL premix     1,000 mg 200 mL/hr over 60 Minutes Intravenous  Once 08/28/18 1038 08/28/18 1453  08/28/18 1030  ceFAZolin (ANCEF) IVPB 2g/100 mL premix     2 g 200 mL/hr over 30 Minutes Intravenous On call to O.R. 08/28/18 1029 08/28/18 1205       Objective: Vitals:   08/29/18 2200 08/30/18 0433 08/30/18 0733 08/30/18 0743  BP: (!) 129/52 102/70 121/61   Pulse: 64 63 69   Resp:  14    Temp: 98.4 F (36.9 C) 97.7 F (36.5 C)    TempSrc: Oral Oral    SpO2: 94% 94% 93% 94%  Weight:      Height:         Intake/Output Summary (Last 24 hours) at 08/30/2018 1023 Last data filed at 08/30/2018 0438 Gross per 24 hour  Intake 655.47 ml  Output 500 ml  Net 155.47 ml   Filed Weights   08/28/18 0207 08/28/18 1015  Weight: 80.7 kg 80.7 kg    Examination: General exam: Appears calm and comfortable  Respiratory system: Clear to auscultation. Respiratory effort normal. Cardiovascular system: S1 & S2 heard, RRR. No JVD, murmurs, rubs, gallops or clicks. No pedal edema. Gastrointestinal system: Abdomen is nondistended, soft and nontender. No organomegaly or masses felt. Normal bowel sounds heard. Central nervous system: Alert and oriented. No focal neurological deficits. Extremities: Symmetric  Skin: No rashes, lesions or ulcers Psychiatry: Judgement and insight appear normal. Mood & affect appropriate.   Data Reviewed: I have personally reviewed following labs and imaging studies  CBC: Recent Labs  Lab 08/27/18 2210 08/28/18 0526 08/28/18 1048 08/29/18 0432 08/30/18 0446  WBC 58.3* 60.1*  --  57.3* 48.7*  NEUTROABS 27.4*  --   --   --   --   HGB 10.9* 9.9* 10.5* 8.4* 8.2*  HCT 34.9* 32.4* 31.0* 27.4* 26.7*  MCV 98.6 100.9*  --  99.6 101.1*  PLT 178 167  --  156 [id number]*   Basic Metabolic Panel: Recent Labs  Lab 08/27/18 2210 08/28/18 0526 08/28/18 1048 08/29/18 0432 08/30/18 0446  NA 142 140 142 137 138  K 4.4 5.7* 4.2 5.3* 5.7*  CL 110 111  --  110 114*  CO2 23 21*  --  18* 18*  GLUCOSE 94 112* 120* 227* 183*  BUN 32* 29*  --  30* 34*  CREATININE 2.38* 1.74*  --  1.71* 1.57*  CALCIUM 9.5 8.8*  --  7.5* 7.7*   GFR: Estimated Creatinine Clearance: 31.7 mL/min (A) (by C-G formula based on SCr of 1.57 mg/dL (H)). Liver Function Tests: No results for input(s): AST, ALT, ALKPHOS, BILITOT, PROT, ALBUMIN in the last 168 hours. No results for input(s): LIPASE, AMYLASE in the last 168 hours. No results for input(s): AMMONIA in the last 168 hours. Coagulation Profile: Recent Labs   Lab 08/27/18 2210  INR 1.1   Cardiac Enzymes: No results for input(s): CKTOTAL, CKMB, CKMBINDEX, TROPONINI in the last 168 hours. BNP (last 3 results) No results for input(s): PROBNP in the last 8760 hours. HbA1C: Recent Labs    08/27/18 2210  HGBA1C 6.1*   CBG: Recent Labs  Lab 08/29/18 0759 08/29/18 1140 08/29/18 1721 08/29/18 2156 08/30/18 0745  GLUCAP 197* 184* 174* 169* 145*   Lipid Profile: No results for input(s): CHOL, HDL, LDLCALC, TRIG, CHOLHDL, LDLDIRECT in the last 72 hours. Thyroid Function Tests: No results for input(s): TSH, T4TOTAL, FREET4, T3FREE, THYROIDAB in the last 72 hours. Anemia Panel: No results for input(s): VITAMINB12, FOLATE, FERRITIN, TIBC, IRON, RETICCTPCT in the last 72 hours. Sepsis Labs: No results for input(s): PROCALCITON,  LATICACIDVEN in the last 168 hours.  Recent Results (from the past 240 hour(s))  SARS CORONAVIRUS 2 (TAT 6-12 HRS)     Status: None   Collection Time: 08/27/18 10:42 PM  Result Value Ref Range Status   SARS Coronavirus 2 NEGATIVE NEGATIVE Final    Comment: (NOTE) SARS-CoV-2 target nucleic acids are NOT DETECTED. The SARS-CoV-2 RNA is generally detectable in upper and lower respiratory specimens during the acute phase of infection. Negative results do not preclude SARS-CoV-2 infection, do not rule out co-infections with other pathogens, and should not be used as the sole basis for treatment or other patient management decisions. Negative results must be combined with clinical observations, patient history, and epidemiological information. The expected result is Negative. Fact Sheet for Patients: SugarRoll.be Fact Sheet for Healthcare Providers: https://www.woods-mathews.com/ This test is not yet approved or cleared by the [place] FDA and  has been authorized for detection and/or diagnosis of SARS-CoV-2 by FDA under an Emergency Use Authorization (EUA). This EUA  will remain  in effect (meaning this test can be used) for the duration of the COVID-19 declaration under Section 56 4(b)(1) of the Act, 21 U.S.C. section 360bbb-3(b)(1), unless the authorization is terminated or revoked sooner. Performed at [facility], [place] [address] [place], [place] [address]   MRSA PCR Screening     Status: Abnormal   Collection Time: 08/28/18  2:33 AM   Specimen: Nasal Mucosa; Nasopharyngeal  Result Value Ref Range Status   MRSA by PCR POSITIVE (A) NEGATIVE Final    Comment:        The GeneXpert MRSA Assay (FDA approved for NASAL specimens only), is one component of a comprehensive MRSA colonization surveillance program. It is not intended to diagnose MRSA infection nor to guide or monitor treatment for MRSA infections. RESULT CALLED TO, READ BACK BY AND VERIFIED WITH: CARTER,K @ [id number] ON [id number] BY POTEAT,S Performed at [place] [address], [place], [place] [address]   SARS CORONAVIRUS 2 (TAT 6-12 HRS) Nasal Swab Aptima Multi Swab     Status: None   Collection Time: 08/29/18  3:17 PM   Specimen: Aptima Multi Swab; Nasal Swab  Result Value Ref Range Status   SARS Coronavirus 2 NEGATIVE NEGATIVE Final    Comment: (NOTE) SARS-CoV-2 target nucleic acids are NOT DETECTED. The SARS-CoV-2 RNA is generally detectable in upper and lower respiratory specimens during the acute phase of infection. Negative results do not preclude SARS-CoV-2 infection, do not rule out co-infections with other pathogens, and should not be used as the sole basis for treatment or other patient management decisions. Negative results must be combined with clinical observations, patient history, and epidemiological information. The expected result is Negative. Fact Sheet for Patients: SugarRoll.be Fact Sheet for Healthcare Providers: https://www.woods-mathews.com/ This test is not yet approved or cleared by  the [place] FDA and  has been authorized for detection and/or diagnosis of SARS-CoV-2 by FDA under an Emergency Use Authorization (EUA). This EUA will remain  in effect (meaning this test can be used) for the duration of the COVID-19 declaration under Section 56 4(b)(1) of the Act, 21 U.S.C. section 360bbb-3(b)(1), unless the authorization is terminated or revoked sooner. Performed at [facility], [place] [address] [place], [place] [address]       Radiology Studies: Dg Ribs Unilateral Left  Result Date: 08/29/2018 CLINICAL DATA:  Left rib pain EXAM: LEFT RIBS - 2 VIEW COMPARISON:  None. FINDINGS: There is a left  lateral 4th rib fracture, nondisplaced. No associated effusion or pneumothorax. Left lung clear. Prior CABG. IMPRESSION: Left lateral nondisplaced 4th rib fracture. Electronically Signed   By: Rolm Baptise M.D.   On: 08/29/2018 19:03   Pelvis Portable  Result Date: 08/28/2018 CLINICAL DATA:  Left hip intramedullary nail placement EXAM: PORTABLE PELVIS 1-2 VIEWS COMPARISON:  Radiographs August 27, 2018 FINDINGS: Interval placement of a proximal left femoral intramedullary nail with transcervical cancellous screw which transfixes the intertrochanteric femur fracture seen on prior radiographs. No new fracture is evident.There are expected postsurgical changes over the hip with intra articular gas. Abdominal hernia mesh anchors are noted in the abdomen. Remaining soft tissues are unremarkable IMPRESSION: ORIF of an intertrochanteric left femur fracture with placement of a left femoral intramedullary nail. Electronically Signed   By: Lovena Le M.D.   On: 08/28/2018 14:30   Dg C-arm 1-60 Min-no Report  Result Date: 08/28/2018 Fluoroscopy was utilized by the requesting physician.  No radiographic interpretation.   Dg Femur Min 2 Views Left  Result Date: 08/28/2018 CLINICAL DATA:  Intertrochanteric fracture LEFT femur, ORIF EXAM: LEFT FEMUR 2 VIEWS COMPARISON:  08/27/2018  FLUOROSCOPY TIME:  1 minutes 17 seconds Images submitted: 4 FINDINGS: Osseous demineralization. Images demonstrate placement of an IM nail with a compression screw across a reduced intertrochanteric fracture of the proximal LEFT femur. No additional fracture dislocation seen. IMPRESSION: Post ORIF of intertrochanteric fracture LEFT femur. Electronically Signed   By: Lavonia Dana M.D.   On: 08/28/2018 12:57      Scheduled Meds: . amLODipine  5 mg Oral Daily  . aspirin  81 mg Oral BID WC  . atenolol  100 mg Oral Daily  . atorvastatin  20 mg Oral Daily  . clopidogrel  75 mg Oral Daily  . docusate sodium  100 mg Oral BID  . insulin aspart  0-9 Units Subcutaneous TID WC  . insulin NPH Human  20 Units Subcutaneous QAC breakfast  . isosorbide mononitrate  30 mg Oral Daily  . mupirocin ointment  1 application Nasal BID  . pantoprazole  40 mg Oral Daily  . senna  1 tablet Oral BID   Continuous Infusions: . sodium chloride 125 mL/hr at 08/30/18 0548     LOS: 3 days      Time spent: 40 minutes   Dessa Phi, DO [facility] [email or web address] 08/30/2018, 10:23 AM
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
## 2018-08-30 NOTE — Telephone Encounter (Signed)
ATC patient to schedule.  Currently still in the hospital.

## 2018-08-30 NOTE — Care Management Important Message (Signed)
Important Message  Patient Details IM Letter given to Dessa Phi RN to present to the Patient Name: [name] Standard MRN: [id number] Date of Birth: 16-Apr-1940   Medicare Important Message Given:  Yes     Kerin Salen 08/30/2018, 11:44 AM

## 2018-08-31 DIAGNOSIS — M2550 Pain in unspecified joint: Secondary | ICD-10-CM | POA: Diagnosis not present

## 2018-08-31 DIAGNOSIS — S72145D Nondisplaced intertrochanteric fracture of left femur, subsequent encounter for closed fracture with routine healing: Secondary | ICD-10-CM | POA: Diagnosis not present

## 2018-08-31 DIAGNOSIS — N183 Chronic kidney disease, stage 3 (moderate): Secondary | ICD-10-CM | POA: Diagnosis not present

## 2018-08-31 DIAGNOSIS — F329 Major depressive disorder, single episode, unspecified: Secondary | ICD-10-CM | POA: Diagnosis not present

## 2018-08-31 DIAGNOSIS — I2510 Atherosclerotic heart disease of native coronary artery without angina pectoris: Secondary | ICD-10-CM | POA: Diagnosis not present

## 2018-08-31 DIAGNOSIS — S72142D Displaced intertrochanteric fracture of left femur, subsequent encounter for closed fracture with routine healing: Secondary | ICD-10-CM | POA: Diagnosis not present

## 2018-08-31 DIAGNOSIS — F411 Generalized anxiety disorder: Secondary | ICD-10-CM | POA: Diagnosis not present

## 2018-08-31 DIAGNOSIS — R0902 Hypoxemia: Secondary | ICD-10-CM | POA: Diagnosis not present

## 2018-08-31 DIAGNOSIS — E1122 Type 2 diabetes mellitus with diabetic chronic kidney disease: Secondary | ICD-10-CM | POA: Diagnosis not present

## 2018-08-31 DIAGNOSIS — F419 Anxiety disorder, unspecified: Secondary | ICD-10-CM | POA: Diagnosis not present

## 2018-08-31 DIAGNOSIS — S2232XD Fracture of one rib, left side, subsequent encounter for fracture with routine healing: Secondary | ICD-10-CM | POA: Diagnosis not present

## 2018-08-31 DIAGNOSIS — E785 Hyperlipidemia, unspecified: Secondary | ICD-10-CM | POA: Diagnosis not present

## 2018-08-31 DIAGNOSIS — R2689 Other abnormalities of gait and mobility: Secondary | ICD-10-CM | POA: Diagnosis not present

## 2018-08-31 DIAGNOSIS — S72142A Displaced intertrochanteric fracture of left femur, initial encounter for closed fracture: Secondary | ICD-10-CM | POA: Diagnosis not present

## 2018-08-31 DIAGNOSIS — R278 Other lack of coordination: Secondary | ICD-10-CM | POA: Diagnosis not present

## 2018-08-31 DIAGNOSIS — Z7401 Bed confinement status: Secondary | ICD-10-CM | POA: Diagnosis not present

## 2018-08-31 DIAGNOSIS — S72002A Fracture of unspecified part of neck of left femur, initial encounter for closed fracture: Secondary | ICD-10-CM | POA: Diagnosis not present

## 2018-08-31 DIAGNOSIS — M79642 Pain in left hand: Secondary | ICD-10-CM | POA: Diagnosis not present

## 2018-08-31 DIAGNOSIS — C9110 Chronic lymphocytic leukemia of B-cell type not having achieved remission: Secondary | ICD-10-CM | POA: Diagnosis not present

## 2018-08-31 DIAGNOSIS — C9111 Chronic lymphocytic leukemia of B-cell type in remission: Secondary | ICD-10-CM | POA: Diagnosis not present

## 2018-08-31 DIAGNOSIS — R1312 Dysphagia, oropharyngeal phase: Secondary | ICD-10-CM | POA: Diagnosis not present

## 2018-08-31 DIAGNOSIS — W19XXXA Unspecified fall, initial encounter: Secondary | ICD-10-CM | POA: Diagnosis not present

## 2018-08-31 DIAGNOSIS — I959 Hypotension, unspecified: Secondary | ICD-10-CM | POA: Diagnosis not present

## 2018-08-31 DIAGNOSIS — K219 Gastro-esophageal reflux disease without esophagitis: Secondary | ICD-10-CM | POA: Diagnosis not present

## 2018-08-31 DIAGNOSIS — E118 Type 2 diabetes mellitus with unspecified complications: Secondary | ICD-10-CM | POA: Diagnosis not present

## 2018-08-31 DIAGNOSIS — N179 Acute kidney failure, unspecified: Secondary | ICD-10-CM | POA: Diagnosis not present

## 2018-08-31 DIAGNOSIS — W19XXXD Unspecified fall, subsequent encounter: Secondary | ICD-10-CM | POA: Diagnosis not present

## 2018-08-31 DIAGNOSIS — I10 Essential (primary) hypertension: Secondary | ICD-10-CM | POA: Diagnosis not present

## 2018-08-31 LAB — CBC
HCT: 28.3 % — ABNORMAL LOW (ref 36.0–46.0)
Hemoglobin: 8.3 g/dL — ABNORMAL LOW (ref 12.0–15.0)
MCH: 30.6 pg (ref 26.0–34.0)
MCHC: 29.3 g/dL — ABNORMAL LOW (ref 30.0–36.0)
MCV: 104.4 fL — ABNORMAL HIGH (ref 80.0–100.0)
Platelets: 144 10*3/uL — ABNORMAL LOW (ref 150–400)
RBC: 2.71 MIL/uL — ABNORMAL LOW (ref 3.87–5.11)
RDW: 14.3 % (ref 11.5–15.5)
WBC: 44.8 10*3/uL — ABNORMAL HIGH (ref 4.0–10.5)
nRBC: 0 % (ref 0.0–0.2)

## 2018-08-31 LAB — BASIC METABOLIC PANEL
Anion gap: 8 (ref 5–15)
BUN: 33 mg/dL — ABNORMAL HIGH (ref 8–23)
CO2: 17 mmol/L — ABNORMAL LOW (ref 22–32)
Calcium: 7.9 mg/dL — ABNORMAL LOW (ref 8.9–10.3)
Chloride: 115 mmol/L — ABNORMAL HIGH (ref 98–111)
Creatinine, Ser: 1.55 mg/dL — ABNORMAL HIGH (ref 0.44–1.00)
GFR calc Af Amer: 37 mL/min — ABNORMAL LOW (ref >60)
GFR calc non Af Amer: 32 mL/min — ABNORMAL LOW (ref >60)
Glucose, Bld: 194 mg/dL — ABNORMAL HIGH (ref 70–99)
Potassium: 4.5 mmol/L (ref 3.5–5.1)
Sodium: 140 mmol/L (ref 135–145)

## 2018-08-31 LAB — GLUCOSE, CAPILLARY
Glucose-Capillary: 226 mg/dL — ABNORMAL HIGH (ref 70–99)
Glucose-Capillary: 158 mg/dL — ABNORMAL HIGH (ref 70–99)

## 2018-08-31 MED ORDER — METHOCARBAMOL 750 MG PO TABS: 750.0000 mg | ORAL_TABLET | Freq: Three times a day (TID) | ORAL | 0 refills | Status: DC | PRN

## 2018-08-31 MED ORDER — FERROUS SULFATE 325 (65 FE) MG PO TABS
325.0000 mg | ORAL_TABLET | Freq: Two times a day (BID) | ORAL | Status: DC
  Administered 2018-08-31: 16:00:00 325 mg via ORAL
  Filled 2018-08-31: qty 1

## 2018-08-31 NOTE — TOC Transition Note (Signed)
Transition of Care [facility]) - CM/SW Discharge Note   Patient Details  Name: [name] MRN: [id number] Date of Birth: [birth year]  [facility] of [facility] CM/SW Contact:  Nila Nephew, LCSW Phone Number: [phone] 08/31/2018, 3:08 PM   Clinical Narrative:   Pt admitting to [facility] today report [phone] [place] selected by pt and daughter-in-law. Will arrange ambulance transportation. Son at bedside going to facility to complete admission paperwork.    Final next level of care: Skilled Nursing Facility Barriers to Discharge: no barriers  Patient Goals and CMS Choice Patient states their goals for this hospitalization and ongoing recovery are:: get stronger CMS Medicare.gov Compare Post Acute Care list provided to:: Patient Choice offered to / list presented to : Patient  Discharge Placement                       Discharge Plan and Services     Post Acute Care Choice: [place]                               Social Determinants of Health (SDOH) Interventions     Readmission Risk Interventions No flowsheet data found.

## 2018-08-31 NOTE — Progress Notes (Signed)
Report called to nurse Mia at [facility]. EMS here now to pick up patient. Pt's pain pre-treated at 1611 with Vicodin. Facility aware pt will want Robaxin tonight. Discharge paperwork & all belongings sent with patient.

## 2018-08-31 NOTE — Discharge Summary (Addendum)
Physician Discharge Summary  Betances IRC:[id number] DOB: [birth year] DOA: 08/27/2018  PCP: Colon Branch, MD  Admit date: 08/27/2018 Discharge date: 08/31/2018  Admitted From: Home Disposition:  SNF   Recommendations for Outpatient Follow-up:  1. Follow up with PCP in 1 week 2. Follow up with Orthopedic surgery in 2 weeks 3. Follow-up with Dr. Marin Olp as previously scheduled  Discharge Condition: Stable CODE STATUS: Partial code, do not intubate   Diet recommendation: Carb modified   Brief/Interim Summary: From H&P by Dr. Posey Pronto: [name] is a 78 y.o.femalewith medical history significant forCLL, CAD withhx of MI s/pCABGx 5 in 1997,insulin-dependent type 2 diabetes, hypertension, hyperlipidemia, CKD stage III, iron deficiency anemia, and GERD who presents to the ED for evaluation of left hip pain after a fall. Patient states she was in her usual state of health today. She was outside working in the yard from 2 PM to 8 PM. After finishing up she was backing her car up. She walked out of her car was pouring a cup of tea out when she stumbled outside and fell onto the ground with her hip landing on the cement. She has significant pain and was unable to stand on her own power or move her left lower extremity much. She denies any injury to her head or loss of consciousness. Patient does report about 4-5 episodes of diarrhea day prior to admission which occurred after eating a salmon patty and pinto beans. She says she has not had any further episodes. In the ED, work up revealed nondisplaced left femoral intertrochanteric fracture. Orthopedic surgery consulted.  Patient underwent left ORIF 8/26 with Dr. Lyla Glassing. She was also found to have nondisplaced left fourth rib fracture.  Discharge Diagnoses:  Principal Problem:   Intertrochanteric fracture of left femur, closed, initial encounter ([place]) Active Problems:   Insulin dependent diabetes mellitus with complications  ([place])   Hyperlipidemia associated with type 2 diabetes mellitus ([place])   Hypertension associated with diabetes ([place])   Hx of CABG   GERD   CLL (chronic lymphocytic leukemia) ([employer])   Acute kidney injury superimposed on chronic kidney disease ([place])   CAD (coronary artery disease)   Left hip fracture -After mechanical fall -Status post ORIF 8/26.  Orthopedic surgery following -PT OT consulted, recommending SNF placement at this time  AKI on CKD stage III -Likely prerenal in setting of GI volume loss -Baseline creatinine around 1.3-1.7 -Hold irbesartan, metformin  -Cr back to baseline 1.55 this morning   Mild hyperkalemia -Resolved  CAD status post CABG -Resume aspirin, Plavix.  Continue Imdur, atorvastatin  CLL -Follows with Dr. Marin Olp, oncology.  She has a follow-up appointment on 9/4  Insulin-dependent type 2 diabetes, well controlled -Hemoglobin A1c 6.2 -Sliding scale insulin and Novolin N qAM  -Stop metformin in setting of CKD  Essential hypertension -Continue atenolol, amlodipine.  Hold irbesartan  Hyperlipidemia -Continue atorvastatin  Dysphagia -SLP consulted, recommend regular diet  Left 4th rib fracture -S/p mechanical fall -Incentive spirometry       Discharge Instructions  Discharge Instructions    Diet Carb Modified   Complete by: As directed    Increase activity slowly   Complete by: As directed      Allergies as of 08/31/2018      Reactions   Codeine    REACTION: makes her nervous, orTylenol #3   Ibuprofen    REACTION: nervous   Meperidine Hcl    REACTION: nasuea and vomitting   Naproxen Sodium  REACTION: nervous      Medication List    STOP taking these medications   aspirin 81 MG EC tablet Replaced by: aspirin 81 MG chewable tablet   irbesartan 300 MG tablet Commonly known as: AVAPRO   metFORMIN 500 MG tablet Commonly known as: GLUCOPHAGE     TAKE these medications   amLODipine 5 MG tablet Commonly known as:  NORVASC TAKE 1 TABLET(5 MG) BY MOUTH DAILY What changed: See the new instructions.   aspirin 81 MG chewable tablet Chew 1 tablet (81 mg total) by mouth 2 (two) times daily with a meal. Replaces: aspirin 81 MG EC tablet   atenolol 100 MG tablet Commonly known as: TENORMIN Take 1 tablet (100 mg total) by mouth daily.   atorvastatin 20 MG tablet Commonly known as: LIPITOR TAKE 1 TABLET(20 MG) BY MOUTH DAILY What changed: See the new instructions.   azelastine 0.1 % nasal spray Commonly known as: ASTELIN Place 2 sprays into both nostrils at bedtime as needed for rhinitis. Use in each nostril as directed   cholecalciferol 1000 units tablet Commonly known as: VITAMIN D Take 1,000 Units by mouth daily.   clopidogrel 75 MG tablet Commonly known as: PLAVIX Take 1 tablet (75 mg total) by mouth daily.   clorazepate 7.5 MG tablet Commonly known as: TRANXENE Take 1 tablet (7.5 mg total) by mouth as needed. What changed:   when to take this  reasons to take this   fluticasone 50 MCG/ACT nasal spray Commonly known as: FLONASE SHAKE LIQUID AND USE 2 SPRAYS IN EACH NOSTRIL DAILY What changed: See the new instructions.   HYDROcodone-acetaminophen 5-325 MG tablet Commonly known as: NORCO/VICODIN Take 2 tablets by mouth every 6 (six) hours as needed for moderate pain or severe pain. What changed:   how much to take  when to take this  reasons to take this   insulin NPH Human 100 UNIT/ML injection Commonly known as: NOVOLIN N 20-38 Units 2 (two) times daily before a meal. Inject 38 units into the skin every morning and inject 20 units into the skin at bedtime.   insulin regular 100 units/mL injection Commonly known as: NOVOLIN R Inject 14 Units into the skin 2 (two) times daily before a meal.   INSULIN SYRINGE .5CC/31GX5/16" 31G X 5/16" 0.5 ML Misc USE TO INJECT INSULIN 5 TIMES PER DAY   isosorbide mononitrate 30 MG 24 hr tablet Commonly known as: IMDUR TAKE 1 TABLET(30  MG) BY MOUTH DAILY What changed: See the new instructions.   liraglutide 18 MG/3ML Sopn Commonly known as: Victoza ADMINISTER 1.2 MG UNDER THE SKIN EVERY DAY What changed:   how much to take  how to take this  when to take this  additional instructions   methocarbamol 750 MG tablet Commonly known as: Robaxin-750 Take 1 tablet (750 mg total) by mouth every 8 (eight) hours as needed for muscle spasms.   mometasone 0.1 % cream Commonly known as: ELOCON Apply 1 application topically daily. Use as directed   multivitamin capsule Take 1 capsule by mouth daily.   nitroGLYCERIN 0.3 MG SL tablet Commonly known as: Nitrostat Place 1 tablet (0.3 mg total) under the tongue every 5 (five) minutes as needed for chest pain (ER if no better after 3 tablets).   omeprazole 20 MG capsule Commonly known as: PRILOSEC Take 1 capsule (20 mg total) by mouth daily.   ONE TOUCH ULTRA SYSTEM KIT w/Device Kit 1 kit by Does not apply route once.   ONE  TOUCH ULTRA TEST test strip Generic drug: glucose blood USE TO TEST BLOOD SUGAR TWICE DAILY AS DIRECTED       Contact information for follow-up providers    Swinteck, Aaron Edelman, MD. Schedule an appointment as soon as possible for a visit in 2 weeks.   Specialty: Orthopedic Surgery Why: For wound re-check Contact information: [address] [place] [address] [phone]        Colon Branch, MD. Schedule an appointment as soon as possible for a visit in 1 week(s).   Specialty: Internal Medicine Contact information: [address] [place] [address] [phone]        [name], MD. Call.   Specialty: Oncology Contact information: [address] [place] [address] [phone]            Contact information for after-discharge care    Destination    [facility] Preferred SNF .   Service: Skilled Nursing Contact information: [place] [phone]                 Allergies  Allergen Reactions  . Codeine     REACTION: makes her nervous, orTylenol #3  . Ibuprofen     REACTION: nervous  . Meperidine Hcl     REACTION: nasuea and vomitting  . Naproxen Sodium     REACTION: nervous    Consultations:  Orthopedic surgery   Procedures/Studies: Dg Chest 1 View  Result Date: 08/27/2018 CLINICAL DATA:  Hip fracture.  Fall. EXAM: CHEST  1 VIEW COMPARISON:  07/25/2017 FINDINGS: Prior CABG. Scarring in the lingula. Heart is borderline in size. Right lung clear. No effusions. No acute bony abnormality. IMPRESSION: Prior CABG.  Lingular scarring.  No active disease. Electronically Signed   By: Rolm Baptise M.D.   On: 08/27/2018 22:30   Dg Ribs Unilateral Left  Result Date: 08/29/2018 CLINICAL DATA:  Left rib pain EXAM: LEFT RIBS - 2 VIEW COMPARISON:  None. FINDINGS: There is a left lateral 4th rib fracture, nondisplaced. No associated effusion or pneumothorax. Left lung clear. Prior CABG. IMPRESSION: Left lateral nondisplaced 4th rib fracture. Electronically Signed   By: Rolm Baptise M.D.   On: 08/29/2018 19:03   Pelvis Portable  Result Date: 08/28/2018 CLINICAL DATA:  Left hip intramedullary nail placement EXAM: PORTABLE PELVIS 1-2 VIEWS COMPARISON:  Radiographs August 27, 2018 FINDINGS: Interval placement of a proximal left femoral intramedullary nail with transcervical cancellous screw which transfixes the intertrochanteric femur fracture seen on prior radiographs. No new fracture is evident.There are expected postsurgical changes over the hip with intra articular gas. Abdominal hernia mesh anchors are noted in the abdomen. Remaining soft tissues are unremarkable IMPRESSION: ORIF of an intertrochanteric left femur fracture with placement of a left femoral intramedullary nail. Electronically Signed   By: Lovena Le M.D.   On: 08/28/2018 14:30   Dg C-arm 1-60 Min-no Report  Result  Date: 08/28/2018 Fluoroscopy was utilized by the requesting physician.  No radiographic interpretation.   Dg Hip Unilat With Pelvis 2-3 Views Left  Result Date: 08/27/2018 CLINICAL DATA:  Fall, hip pain EXAM: DG HIP (WITH OR WITHOUT PELVIS) 2-3V LEFT COMPARISON:  None. FINDINGS: There is a left femoral intertrochanteric fracture. This is nondisplaced. No angulation. No subluxation or dislocation. IMPRESSION: No displaced left femoral intertrochanteric fracture. Electronically Signed   By: Rolm Baptise M.D.   On: 08/27/2018 22:31   Dg Femur  Min 2 Views Left  Result Date: 08/28/2018 CLINICAL DATA:  Intertrochanteric fracture LEFT femur, ORIF EXAM: LEFT FEMUR 2 VIEWS COMPARISON:  08/27/2018 FLUOROSCOPY TIME:  1 minutes 17 seconds Images submitted: 4 FINDINGS: Osseous demineralization. Images demonstrate placement of an IM nail with a compression screw across a reduced intertrochanteric fracture of the proximal LEFT femur. No additional fracture dislocation seen. IMPRESSION: Post ORIF of intertrochanteric fracture LEFT femur. Electronically Signed   By: Lavonia Dana M.D.   On: 08/28/2018 12:57      Discharge Exam: Vitals:   08/31/18 0605 08/31/18 1442  BP: (!) 120/52 127/63  Pulse: 65 61  Resp: 18 18  Temp: 98.3 F (36.8 C) 98.4 F (36.9 C)  SpO2: 97% 92%    General: Pt is alert, awake, not in acute distress Cardiovascular: RRR, S1/S2 +, no edema Respiratory: CTA bilaterally, no wheezing, no rhonchi, no respiratory distress, no conversational dyspnea  Abdominal: Soft, NT, ND, bowel sounds + Extremities: no edema, no cyanosis Psych: Normal mood and affect, stable judgement and insight     The results of significant diagnostics from this hospitalization (including imaging, microbiology, ancillary and laboratory) are listed below for reference.     Microbiology: Recent Results (from the past 240 hour(s))  SARS CORONAVIRUS 2 (TAT 6-12 HRS)     Status: None   Collection Time: 08/27/18  10:42 PM  Result Value Ref Range Status   SARS Coronavirus 2 NEGATIVE NEGATIVE Final    Comment: (NOTE) SARS-CoV-2 target nucleic acids are NOT DETECTED. The SARS-CoV-2 RNA is generally detectable in upper and lower respiratory specimens during the acute phase of infection. Negative results do not preclude SARS-CoV-2 infection, do not rule out co-infections with other pathogens, and should not be used as the sole basis for treatment or other patient management decisions. Negative results must be combined with clinical observations, patient history, and epidemiological information. The expected result is Negative. Fact Sheet for Patients: SugarRoll.be Fact Sheet for Healthcare Providers: https://www.woods-mathews.com/ This test is not yet approved or cleared by the [place] FDA and  has been authorized for detection and/or diagnosis of SARS-CoV-2 by FDA under an Emergency Use Authorization (EUA). This EUA will remain  in effect (meaning this test can be used) for the duration of the COVID-19 declaration under Section 56 4(b)(1) of the Act, 21 U.S.C. section 360bbb-3(b)(1), unless the authorization is terminated or revoked sooner. Performed at [facility], [place] [address] [place], [place] [address]   MRSA PCR Screening     Status: Abnormal   Collection Time: 08/28/18  2:33 AM   Specimen: Nasal Mucosa; Nasopharyngeal  Result Value Ref Range Status   MRSA by PCR POSITIVE (A) NEGATIVE Final    Comment:        The GeneXpert MRSA Assay (FDA approved for NASAL specimens only), is one component of a comprehensive MRSA colonization surveillance program. It is not intended to diagnose MRSA infection nor to guide or monitor treatment for MRSA infections. RESULT CALLED TO, READ BACK BY AND VERIFIED WITH: CARTER,K @ 3154 ON [id number] BY POTEAT,S Performed at [place] [address] [place], [place] [address]    SARS CORONAVIRUS 2 (TAT 6-12 HRS) Nasal Swab Aptima Multi Swab     Status: None   Collection Time: 08/29/18  3:17 PM   Specimen: Aptima Multi Swab; Nasal Swab  Result Value Ref Range Status   SARS Coronavirus 2 NEGATIVE NEGATIVE Final    Comment: (NOTE) SARS-CoV-2 target nucleic acids are  NOT DETECTED. The SARS-CoV-2 RNA is generally detectable in upper and lower respiratory specimens during the acute phase of infection. Negative results do not preclude SARS-CoV-2 infection, do not rule out co-infections with other pathogens, and should not be used as the sole basis for treatment or other patient management decisions. Negative results must be combined with clinical observations, patient history, and epidemiological information. The expected result is Negative. Fact Sheet for Patients: SugarRoll.be Fact Sheet for Healthcare Providers: https://www.woods-mathews.com/ This test is not yet approved or cleared by the [place] FDA and  has been authorized for detection and/or diagnosis of SARS-CoV-2 by FDA under an Emergency Use Authorization (EUA). This EUA will remain  in effect (meaning this test can be used) for the duration of the COVID-19 declaration under Section 56 4(b)(1) of the Act, 21 U.S.C. section 360bbb-3(b)(1), unless the authorization is terminated or revoked sooner. Performed at [facility], [place] [address] [place], [place] [address]      Labs: BNP (last 3 results) No results for input(s): BNP in the last 8760 hours. Basic Metabolic Panel: Recent Labs  Lab 08/27/18 2210 08/28/18 0526 08/28/18 1048 08/29/18 0432 08/30/18 0446 08/31/18 0508  NA 142 140 142 137 138 140  K 4.4 5.7* 4.2 5.3* 5.7* 4.5  CL 110 111  --  110 114* 115*  CO2 23 21*  --  18* 18* 17*  GLUCOSE 94 112* 120* 227* 183* 194*  BUN 32* 29*  --  30* 34* 33*  CREATININE 2.38* 1.74*  --  1.71* 1.57* 1.55*  CALCIUM 9.5 8.8*  --  7.5* 7.7* 7.9*    Liver Function Tests: No results for input(s): AST, ALT, ALKPHOS, BILITOT, PROT, ALBUMIN in the last 168 hours. No results for input(s): LIPASE, AMYLASE in the last 168 hours. No results for input(s): AMMONIA in the last 168 hours. CBC: Recent Labs  Lab 08/27/18 2210 08/28/18 0526 08/28/18 1048 08/29/18 0432 08/30/18 0446 08/31/18 0508  WBC 58.3* 60.1*  --  57.3* 48.7* 44.8*  NEUTROABS 27.4*  --   --   --   --   --   HGB 10.9* 9.9* 10.5* 8.4* 8.2* 8.3*  HCT 34.9* 32.4* 31.0* 27.4* 26.7* 28.3*  MCV 98.6 100.9*  --  99.6 101.1* 104.4*  PLT 178 167  --  156 141* 144*   Cardiac Enzymes: No results for input(s): CKTOTAL, CKMB, CKMBINDEX, TROPONINI in the last 168 hours. BNP: Invalid input(s): POCBNP CBG: Recent Labs  Lab 08/30/18 1159 08/30/18 1640 08/30/18 2203 08/31/18 1140 08/31/18 1617  GLUCAP 159* 179* 189* 226* 158*   D-Dimer No results for input(s): DDIMER in the last 72 hours. Hgb A1c No results for input(s): HGBA1C in the last 72 hours. Lipid Profile No results for input(s): CHOL, HDL, LDLCALC, TRIG, CHOLHDL, LDLDIRECT in the last 72 hours. Thyroid function studies No results for input(s): TSH, T4TOTAL, T3FREE, THYROIDAB in the last 72 hours.  Invalid input(s): FREET3 Anemia work up No results for input(s): VITAMINB12, FOLATE, FERRITIN, TIBC, IRON, RETICCTPCT in the last 72 hours. Urinalysis    Component Value Date/Time   COLORURINE YELLOW 08/15/2008 0042   APPEARANCEUR CLEAR 08/15/2008 0042   LABSPEC 1.017 08/15/2008 0042   PHURINE 5.0 08/15/2008 0042   GLUCOSEU 100 (A) 08/15/2008 0042   GLUCOSEU NEGATIVE 06/30/2008 0841   HGBUR NEGATIVE 08/15/2008 0042   BILIRUBINUR negative 08/02/2017 1023   KETONESUR negative 08/02/2017 1023   KETONESUR NEGATIVE 08/15/2008 0042   PROTEINUR =100 (A) 08/02/2017 1023  PROTEINUR NEGATIVE 08/15/2008 0042   UROBILINOGEN 0.2 08/02/2017 1023   UROBILINOGEN 0.2 08/15/2008 0042   NITRITE Negative 08/02/2017 1023    NITRITE NEGATIVE 08/15/2008 0042   LEUKOCYTESUR Trace (A) 08/02/2017 1023   Sepsis Labs Invalid input(s): PROCALCITONIN,  WBC,  LACTICIDVEN Microbiology Recent Results (from the past 240 hour(s))  SARS CORONAVIRUS 2 (TAT 6-12 HRS)     Status: None   Collection Time: 08/27/18 10:42 PM  Result Value Ref Range Status   SARS Coronavirus 2 NEGATIVE NEGATIVE Final    Comment: (NOTE) SARS-CoV-2 target nucleic acids are NOT DETECTED. The SARS-CoV-2 RNA is generally detectable in upper and lower respiratory specimens during the acute phase of infection. Negative results do not preclude SARS-CoV-2 infection, do not rule out co-infections with other pathogens, and should not be used as the sole basis for treatment or other patient management decisions. Negative results must be combined with clinical observations, patient history, and epidemiological information. The expected result is Negative. Fact Sheet for Patients: SugarRoll.be Fact Sheet for Healthcare Providers: https://www.woods-mathews.com/ This test is not yet approved or cleared by the [place] FDA and  has been authorized for detection and/or diagnosis of SARS-CoV-2 by FDA under an Emergency Use Authorization (EUA). This EUA will remain  in effect (meaning this test can be used) for the duration of the COVID-19 declaration under Section 56 4(b)(1) of the Act, 21 U.S.C. section 360bbb-3(b)(1), unless the authorization is terminated or revoked sooner. Performed at [facility], [place] [address], [place], [place] [address]   MRSA PCR Screening     Status: Abnormal   Collection Time: 08/28/18  2:33 AM   Specimen: Nasal Mucosa; Nasopharyngeal  Result Value Ref Range Status   MRSA by PCR POSITIVE (A) NEGATIVE Final    Comment:        The GeneXpert MRSA Assay (FDA approved for NASAL specimens only), is one component of a comprehensive MRSA colonization surveillance program. It is  not intended to diagnose MRSA infection nor to guide or monitor treatment for MRSA infections. RESULT CALLED TO, READ BACK BY AND VERIFIED WITH: CARTER,K @ 2703 ON [id number] BY POTEAT,S Performed at [facility] [address] [place], [place] [address]   SARS CORONAVIRUS 2 (TAT 6-12 HRS) Nasal Swab Aptima Multi Swab     Status: None   Collection Time: 08/29/18  3:17 PM   Specimen: Aptima Multi Swab; Nasal Swab  Result Value Ref Range Status   SARS Coronavirus 2 NEGATIVE NEGATIVE Final    Comment: (NOTE) SARS-CoV-2 target nucleic acids are NOT DETECTED. The SARS-CoV-2 RNA is generally detectable in upper and lower respiratory specimens during the acute phase of infection. Negative results do not preclude SARS-CoV-2 infection, do not rule out co-infections with other pathogens, and should not be used as the sole basis for treatment or other patient management decisions. Negative results must be combined with clinical observations, patient history, and epidemiological information. The expected result is Negative. Fact Sheet for Patients: SugarRoll.be Fact Sheet for Healthcare Providers: https://www.woods-mathews.com/ This test is not yet approved or cleared by the [place] FDA and  has been authorized for detection and/or diagnosis of SARS-CoV-2 by FDA under an Emergency Use Authorization (EUA). This EUA will remain  in effect (meaning this test can be used) for the duration of the COVID-19 declaration under Section 56 4(b)(1) of the Act, 21 U.S.C. section 360bbb-3(b)(1), unless the authorization is terminated or revoked sooner. Performed at [facility], [place]  [address], [place], [place] [address]      Patient was seen and examined on the day of discharge and was found to be in stable condition. Time coordinating discharge: 40 minutes including assessment and coordination of care, as well as examination of  the patient.   SIGNED:  Dessa Phi, DO [facility] [email or web address] 08/31/2018, 4:24 PM

## 2018-08-31 NOTE — Progress Notes (Signed)
Orthopedics Progress Note  Subjective: Patient is feeling better with the hip  Objective:  Vitals:   08/30/18 2037 08/31/18 0605  BP: (!) 105/48 (!) 120/52  Pulse: 63 65  Resp: 18 18  Temp: 98 F (36.7 C) 98.3 F (36.8 C)  SpO2: 99% 97%    General: Awake and alert  Musculoskeletal: Left hip dressing intact, no erythema and no active drainage Neurovascularly intact  Lab Results  Component Value Date   WBC 44.8 (H) 08/31/2018   HGB 8.3 (L) 08/31/2018   HCT 28.3 (L) 08/31/2018   MCV 104.4 (H) 08/31/2018   PLT 144 (L) 08/31/2018       Component Value Date/Time   NA 140 08/31/2018 0508   NA 147 (H) 11/03/2016 1018   NA 143 08/30/2016 0954   K 4.5 08/31/2018 0508   K 3.9 11/03/2016 1018   K 4.5 08/30/2016 0954   CL 115 (H) 08/31/2018 0508   CL 110 (H) 11/03/2016 1018   CO2 17 (L) 08/31/2018 0508   CO2 27 11/03/2016 1018   CO2 24 08/30/2016 0954   GLUCOSE 194 (H) 08/31/2018 0508   GLUCOSE 95 11/03/2016 1018   BUN 33 (H) 08/31/2018 0508   BUN 25 (H) 11/03/2016 1018   BUN 20.5 08/30/2016 0954   CREATININE 1.55 (H) 08/31/2018 0508   CREATININE 1.46 (H) 03/06/2018 1144   CREATININE 1.6 (H) 11/03/2016 1018   CREATININE 1.4 (H) 08/30/2016 0954   CALCIUM 7.9 (L) 08/31/2018 0508   CALCIUM 10.1 11/03/2016 1018   CALCIUM 9.8 08/30/2016 0954   GFRNONAA 32 (L) 08/31/2018 0508   GFRNONAA 34 (L) 03/06/2018 1144   GFRNONAA 39 (L) 09/21/2015 1241   GFRAA 37 (L) 08/31/2018 0508   GFRAA 40 (L) 03/06/2018 1144   GFRAA 45 (L) 09/21/2015 1241    Lab Results  Component Value Date   INR 1.1 08/27/2018   INR 1.0 09/07/2015   INR 1.0 08/15/2008    Assessment/Plan: POD #3 s/p Procedure(s): INTRAMEDULLARY (IM) NAIL FEMORAL Mobilization with therapy Pulmonary toilet  DVT prophylaxis - aspirin and Plavix, plus mechanical  Doran Heater. Veverly Fells, MD 08/31/2018 9:34 AM

## 2018-09-02 DIAGNOSIS — I2510 Atherosclerotic heart disease of native coronary artery without angina pectoris: Secondary | ICD-10-CM | POA: Diagnosis not present

## 2018-09-02 DIAGNOSIS — E1122 Type 2 diabetes mellitus with diabetic chronic kidney disease: Secondary | ICD-10-CM | POA: Diagnosis not present

## 2018-09-02 DIAGNOSIS — C9111 Chronic lymphocytic leukemia of B-cell type in remission: Secondary | ICD-10-CM | POA: Diagnosis not present

## 2018-09-02 DIAGNOSIS — S72145D Nondisplaced intertrochanteric fracture of left femur, subsequent encounter for closed fracture with routine healing: Secondary | ICD-10-CM | POA: Diagnosis not present

## 2018-09-02 LAB — GLUCOSE, CAPILLARY: Glucose-Capillary: 189 mg/dL — ABNORMAL HIGH (ref 70–99)

## 2018-09-03 ENCOUNTER — Other Ambulatory Visit: Payer: Medicare Other

## 2018-09-03 DIAGNOSIS — M79642 Pain in left hand: Secondary | ICD-10-CM | POA: Diagnosis not present

## 2018-09-04 ENCOUNTER — Other Ambulatory Visit: Payer: Self-pay | Admitting: *Deleted

## 2018-09-04 NOTE — Patient Outreach (Signed)
Member assessed for potential [facility] Care Management needs as a benefit of  [employer].  Member is currently receiving rehab therapy at [facility].  Notification sent to both facility discharge planner and [facility] UM RN to make aware that writer is following for potential [facility] Care Management needs.  Chart review reveals member lives alone. She has a medical history of DM, HLD, CLL, CAD, CKD stage III, HTN.  Will continue to collaborate with [facility] UM team and facility while member is at [facility] SNF.    Marthenia Rolling, MSN-Ed, RN,BSN [place] Acute Care Coordinator [phone] [facility]) [phone]  (Toll free office)

## 2018-09-05 ENCOUNTER — Ambulatory Visit: Payer: Medicare Other | Admitting: Endocrinology

## 2018-09-05 DIAGNOSIS — I10 Essential (primary) hypertension: Secondary | ICD-10-CM | POA: Diagnosis not present

## 2018-09-05 DIAGNOSIS — F411 Generalized anxiety disorder: Secondary | ICD-10-CM | POA: Diagnosis not present

## 2018-09-05 DIAGNOSIS — N183 Chronic kidney disease, stage 3 (moderate): Secondary | ICD-10-CM | POA: Diagnosis not present

## 2018-09-05 DIAGNOSIS — E1122 Type 2 diabetes mellitus with diabetic chronic kidney disease: Secondary | ICD-10-CM | POA: Diagnosis not present

## 2018-09-05 DIAGNOSIS — C9110 Chronic lymphocytic leukemia of B-cell type not having achieved remission: Secondary | ICD-10-CM | POA: Diagnosis not present

## 2018-09-05 DIAGNOSIS — S72002A Fracture of unspecified part of neck of left femur, initial encounter for closed fracture: Secondary | ICD-10-CM | POA: Diagnosis not present

## 2018-09-05 DIAGNOSIS — N179 Acute kidney failure, unspecified: Secondary | ICD-10-CM | POA: Diagnosis not present

## 2018-09-06 ENCOUNTER — Ambulatory Visit: Payer: Medicare Other | Admitting: Hematology & Oncology

## 2018-09-06 ENCOUNTER — Other Ambulatory Visit: Payer: Medicare Other

## 2018-09-07 DIAGNOSIS — N183 Chronic kidney disease, stage 3 (moderate): Secondary | ICD-10-CM | POA: Diagnosis not present

## 2018-09-07 DIAGNOSIS — F419 Anxiety disorder, unspecified: Secondary | ICD-10-CM | POA: Diagnosis not present

## 2018-09-07 DIAGNOSIS — S72145D Nondisplaced intertrochanteric fracture of left femur, subsequent encounter for closed fracture with routine healing: Secondary | ICD-10-CM | POA: Diagnosis not present

## 2018-09-07 DIAGNOSIS — I10 Essential (primary) hypertension: Secondary | ICD-10-CM | POA: Diagnosis not present

## 2018-09-07 DIAGNOSIS — I2510 Atherosclerotic heart disease of native coronary artery without angina pectoris: Secondary | ICD-10-CM | POA: Diagnosis not present

## 2018-09-07 DIAGNOSIS — C9111 Chronic lymphocytic leukemia of B-cell type in remission: Secondary | ICD-10-CM | POA: Diagnosis not present

## 2018-09-07 DIAGNOSIS — W19XXXD Unspecified fall, subsequent encounter: Secondary | ICD-10-CM | POA: Diagnosis not present

## 2018-09-07 DIAGNOSIS — S2232XD Fracture of one rib, left side, subsequent encounter for fracture with routine healing: Secondary | ICD-10-CM | POA: Diagnosis not present

## 2018-09-10 DIAGNOSIS — C9111 Chronic lymphocytic leukemia of B-cell type in remission: Secondary | ICD-10-CM | POA: Diagnosis not present

## 2018-09-10 DIAGNOSIS — S2232XD Fracture of one rib, left side, subsequent encounter for fracture with routine healing: Secondary | ICD-10-CM | POA: Diagnosis not present

## 2018-09-10 DIAGNOSIS — S72145D Nondisplaced intertrochanteric fracture of left femur, subsequent encounter for closed fracture with routine healing: Secondary | ICD-10-CM | POA: Diagnosis not present

## 2018-09-10 DIAGNOSIS — E1122 Type 2 diabetes mellitus with diabetic chronic kidney disease: Secondary | ICD-10-CM | POA: Diagnosis not present

## 2018-09-13 DIAGNOSIS — S72142D Displaced intertrochanteric fracture of left femur, subsequent encounter for closed fracture with routine healing: Secondary | ICD-10-CM | POA: Diagnosis not present

## 2018-09-18 ENCOUNTER — Other Ambulatory Visit: Payer: Self-pay | Admitting: *Deleted

## 2018-09-18 DIAGNOSIS — C9111 Chronic lymphocytic leukemia of B-cell type in remission: Secondary | ICD-10-CM | POA: Diagnosis not present

## 2018-09-18 DIAGNOSIS — I2510 Atherosclerotic heart disease of native coronary artery without angina pectoris: Secondary | ICD-10-CM | POA: Diagnosis not present

## 2018-09-18 DIAGNOSIS — S72145D Nondisplaced intertrochanteric fracture of left femur, subsequent encounter for closed fracture with routine healing: Secondary | ICD-10-CM | POA: Diagnosis not present

## 2018-09-18 DIAGNOSIS — I10 Essential (primary) hypertension: Secondary | ICD-10-CM

## 2018-09-18 DIAGNOSIS — E1122 Type 2 diabetes mellitus with diabetic chronic kidney disease: Secondary | ICD-10-CM | POA: Diagnosis not present

## 2018-09-18 NOTE — Patient Outreach (Signed)
Member assessed for potential [facility] Care Management needs as a benefit of  [employer].  Member is currently receiving rehab therapy at [facility].  Collaboration with [place] after her telephonic IDT meeting with facility. Writer informed that member will dc home tomorrow 09/18/18  with outpatient therapy arranged.  Telephone call made to Mrs. Goodrich at [phone] to discuss [facility] CM follow up. No answer. Unable to leave message due to voicemail box being full. HIPAA compliant voicemail message left at [phone].   Will make referral to [place]. Member has a medical history of DM, CLL,CAD, CKD stage III, GERD, s/p left hip surgery after sustaining fall. Mrs. Giovanelli lives alone.   Mrs. Stinson is slated for dc from [facility] on 09/18/18.   Marthenia Rolling, MSN-Ed, RN,BSN [place] Acute Care Coordinator [phone] [facility]) [phone]  (Toll free office)

## 2018-09-20 ENCOUNTER — Other Ambulatory Visit: Payer: Self-pay | Admitting: Cardiovascular Disease

## 2018-09-20 ENCOUNTER — Other Ambulatory Visit: Payer: Self-pay

## 2018-09-20 NOTE — Patient Outreach (Signed)
[facility] [facility]) Care Management  09/20/2018  [name], [name] [id number]     Transition of Care Referral  Referral Date: 09/19/2018 Referral Source: [facility] Date of Admission: 08/31/2018 Diagnosis:fracture of the femur, fall" Date of Discharge: 09/19/2018 Facility: [facility] Insurance: Medicare    Outreach attempt # 1 to patient. Spoke with patient who voices she is still lying down and has not gotten up yet for today. She reports that she was discharged home on yesterday afternoon. Her son and daughter in law are with her and assisting with her care needs. She denies any pain at present. She voices that she has been trying not to take a lot of pain meds but admits to going long periods without any pain med although she is in pain. RN CM discussed with patient pain mgmt and the importance of staying on top of pain and pain mgmt. Patient voiced understanding. She reports that she was not given discharge paperwork when leaving facility and does not think her family got anything either. She reports that her daughter in law has already called MD office to follow up on therapy. Patient states that she was told she would benefit better from outpatient therapy instead of in hometherapy. RN CM spoke with daughter in law(Cindy) at patient's request and she confirmed that she called office yesterday to notify them patient was home and they are to call her back once therapy arranged. Patient voices that her incision is healing and doing well. She states area was "glued" together and she has no staples or stitches. RN CM reviewed with patient s/s of infection and ways to aide in proper wound healing. She voiced understanding.   Conditions: Per chart review, patient has PMH of CAD,HTN,HLD,DM, GERD and CKD stage 3. Patient was hosptialized form 08/27/2018-08/31/2018 with a fractured femur following a fall. She reprots that was her first fall ever and she does not hoep to have  another. RN CM reviewed with patient fall/safety mesures. She states that appetite was not good while in facility and she lost a few pounds. She has cbg meter and is checking blood sugars as ordered. She reprots most reent cbg was 176.   Medications: Med review completed with patient. She confirms that she has all her meds in the home and understands when and how to take them. Patient a little upset that she was not sent home with Metformin med from facility. RN CM reviewed discharge summary med list with patient and per orders med was discontinued. She voiced understanding and states she has not taken med.   Appointments: Patient has ortho follow up appt in a month. She was unaware and states she was not told to follow up with PCP. RN CM educated pt/family on importance of PCP follow up following inpatient admission and encouraged them to contact PCP.  Consent: [facility] services reviewed and discussed with patient. Verbal consent for services given.      Plan: RN CM will make outreach attempt to patient within a week. RN CM will send successful outreach letter to patient.    Enzo Montgomery, RN,BSN,CCM [place] Management Telephonic Care Management Coordinator Direct Phone: [phone] Toll Free: [phone] Fax: [phone]

## 2018-09-22 ENCOUNTER — Other Ambulatory Visit: Payer: Self-pay | Admitting: Endocrinology

## 2018-09-23 ENCOUNTER — Other Ambulatory Visit: Payer: Self-pay

## 2018-09-23 NOTE — Patient Outreach (Signed)
[facility] [facility]) Care Management  09/23/2018  [place] [birth year] [id number]    EMMI-General Discharge RED ON EMMI ALERT Day # 1 Date: 09/21/2018 Red Alert Reason: " Got discharge papers? No  Know who to call about changes in condition? No  Scheduled follow up? No"   Outreach attempt # 1 to patient. Spoke with patient who voices she is doing fairly well. She denies any acute issues or concerns at present. Reviewed and addressed red alert with patient. RN CM had already discussed these issues on previous call. Patient received discharge med list but no paperwork. Family did not call facility back to get paperwork as they feel they know what to do. RN CM confirmed with patient that she know who,when and how to contact MDs if needed. Her son and daughter in law are assisting her and making contact with MD offices as need. Patient states that daughter in law has been on the phone with ortho MD office getting therapy set up and is awaiting call back. She reports PCP appt has been scheduled for 10/01/2018. Family will be taking her to appts. She denies any further RN CM needs or concerns at this time.        Plan: RN CM will make outreach attempt to patient within one week.  Enzo Montgomery, RN,BSN,CCM [place] Management Telephonic Care Management Coordinator Direct Phone: [phone] Toll Free: [phone] Fax: [phone]

## 2018-09-24 ENCOUNTER — Telehealth: Payer: Self-pay | Admitting: Endocrinology

## 2018-09-24 ENCOUNTER — Other Ambulatory Visit: Payer: Self-pay

## 2018-09-24 DIAGNOSIS — M25552 Pain in left hip: Secondary | ICD-10-CM | POA: Diagnosis not present

## 2018-09-24 MED ORDER — ONETOUCH ULTRA VI STRP: ORAL_STRIP | 2 refills | Status: DC

## 2018-09-24 NOTE — Telephone Encounter (Signed)
Rx sent 

## 2018-09-24 NOTE — Telephone Encounter (Signed)
Pt called about needing a Rx for test strips. Pt states the pharmacy will not refill the test strips. Pt has 3 test strips. Pt missed last appt due to breaking her hip and ribs. Pt will not have anyone to bring her this week.   ONE TOUCH ULTRA TEST test strip  Pharmacy is [facility], [place]  Please advise and thank you!  Call pt @ [phone]

## 2018-09-26 DIAGNOSIS — M25552 Pain in left hip: Secondary | ICD-10-CM | POA: Diagnosis not present

## 2018-09-27 ENCOUNTER — Ambulatory Visit: Payer: Medicare Other

## 2018-09-30 ENCOUNTER — Other Ambulatory Visit: Payer: Self-pay

## 2018-09-30 NOTE — Patient Outreach (Signed)
[facility]) Care Management  09/30/2018  [place] [birth year] [id number]   Transition of Care Weekly Call   Outreach attempt #1 to patient. Spoke with patient who was still lying in the bed. She reports that things have been going well. She denies any acute issues or concerns at this time. She is pleased to report that she has started outpatient therapy. She voices she has had two sessions and they went well. Patient reports that her pain continues to be managed and controlled at present. She goes for PCP follow up appt on tomorrow. She states that she plans to get flu shot while at MD office. She denies any RN CM needs or concerns at present. She has supportive son and daughter in law who continue to assist her as needed.    Plan: RN CM will make outreach attempt to patient within a week.   Enzo Montgomery, RN,BSN,CCM [place] Management Telephonic Care Management Coordinator Direct Phone: [phone] Toll Free: [phone] Fax: [phone]

## 2018-10-01 ENCOUNTER — Ambulatory Visit (INDEPENDENT_AMBULATORY_CARE_PROVIDER_SITE_OTHER): Payer: Medicare Other | Admitting: Internal Medicine

## 2018-10-01 ENCOUNTER — Encounter: Payer: Self-pay | Admitting: Internal Medicine

## 2018-10-01 ENCOUNTER — Other Ambulatory Visit: Payer: Self-pay

## 2018-10-01 ENCOUNTER — Telehealth: Payer: Self-pay | Admitting: Emergency Medicine

## 2018-10-01 VITALS — BP 135/55 | HR 73 | Temp 97.3°F | Resp 16 | Ht 66.0 in | Wt 161.1 lb

## 2018-10-01 DIAGNOSIS — E1159 Type 2 diabetes mellitus with other circulatory complications: Secondary | ICD-10-CM | POA: Diagnosis not present

## 2018-10-01 DIAGNOSIS — S72142A Displaced intertrochanteric fracture of left femur, initial encounter for closed fracture: Secondary | ICD-10-CM

## 2018-10-01 DIAGNOSIS — C9110 Chronic lymphocytic leukemia of B-cell type not having achieved remission: Secondary | ICD-10-CM

## 2018-10-01 DIAGNOSIS — I10 Essential (primary) hypertension: Secondary | ICD-10-CM

## 2018-10-01 DIAGNOSIS — E1169 Type 2 diabetes mellitus with other specified complication: Secondary | ICD-10-CM

## 2018-10-01 DIAGNOSIS — E785 Hyperlipidemia, unspecified: Secondary | ICD-10-CM | POA: Diagnosis not present

## 2018-10-01 DIAGNOSIS — Z23 Encounter for immunization: Secondary | ICD-10-CM

## 2018-10-01 DIAGNOSIS — M8000XS Age-related osteoporosis with current pathological fracture, unspecified site, sequela: Secondary | ICD-10-CM | POA: Diagnosis not present

## 2018-10-01 DIAGNOSIS — M8000XA Age-related osteoporosis with current pathological fracture, unspecified site, initial encounter for fracture: Secondary | ICD-10-CM | POA: Insufficient documentation

## 2018-10-01 LAB — CBC WITH DIFFERENTIAL/PLATELET
Basophils Absolute: 0.2 10*3/uL — ABNORMAL HIGH (ref 0.0–0.1)
Basophils Relative: 0.3 % (ref 0.0–3.0)
Eosinophils Absolute: 0.4 10*3/uL (ref 0.0–0.7)
Eosinophils Relative: 0.7 % (ref 0.0–5.0)
HCT: 38.8 % (ref 36.0–46.0)
Hemoglobin: 12.1 g/dL (ref 12.0–15.0)
Lymphocytes Relative: 86.9 % — ABNORMAL HIGH (ref 12.0–46.0)
Lymphs Abs: 46.8 10*3/uL — ABNORMAL HIGH (ref 0.7–4.0)
MCHC: 31.2 g/dL (ref 30.0–36.0)
MCV: 94.8 fl (ref 78.0–100.0)
Monocytes Absolute: 1.2 10*3/uL — ABNORMAL HIGH (ref 0.1–1.0)
Monocytes Relative: 2.2 % — ABNORMAL LOW (ref 3.0–12.0)
Neutro Abs: 5.3 10*3/uL (ref 1.4–7.7)
Neutrophils Relative %: 9.9 % — ABNORMAL LOW (ref 43.0–77.0)
Platelets: 240 10*3/uL (ref 150.0–400.0)
RBC: 4.09 Mil/uL (ref 3.87–5.11)
RDW: 15.4 % (ref 11.5–15.5)
WBC: 53.8 10*3/uL (ref 4.0–10.5)

## 2018-10-01 LAB — COMPREHENSIVE METABOLIC PANEL
ALT: 17 U/L (ref 0–35)
AST: 28 U/L (ref 0–37)
Albumin: 4.4 g/dL (ref 3.5–5.2)
Alkaline Phosphatase: 143 U/L — ABNORMAL HIGH (ref 39–117)
BUN: 19 mg/dL (ref 6–23)
CO2: 29 mEq/L (ref 19–32)
Calcium: 11.1 mg/dL — ABNORMAL HIGH (ref 8.4–10.5)
Chloride: 102 mEq/L (ref 96–112)
Creatinine, Ser: 1.6 mg/dL — ABNORMAL HIGH (ref 0.40–1.20)
GFR: 31.09 mL/min — ABNORMAL LOW (ref >60.00)
Glucose, Bld: 104 mg/dL — ABNORMAL HIGH (ref 70–99)
Potassium: 4.8 mEq/L (ref 3.5–5.1)
Sodium: 141 mEq/L (ref 135–145)
Total Bilirubin: 0.5 mg/dL (ref 0.2–1.2)
Total Protein: 7.1 g/dL (ref 6.0–8.3)

## 2018-10-01 LAB — LIPID PANEL
Cholesterol: 136 mg/dL (ref 0–200)
HDL: 35 mg/dL — ABNORMAL LOW (ref >39.00)
LDL Cholesterol: 65 mg/dL (ref 0–99)
NonHDL: 100.6
Total CHOL/HDL Ratio: 4
Triglycerides: 176 mg/dL — ABNORMAL HIGH (ref 0.0–149.0)
VLDL: 35.2 mg/dL (ref 0.0–40.0)

## 2018-10-01 MED ORDER — HYDROCODONE-ACETAMINOPHEN 5-325 MG PO TABS: 1.0000 | ORAL_TABLET | Freq: Two times a day (BID) | ORAL | 0 refills | Status: DC | PRN

## 2018-10-01 MED ORDER — ALENDRONATE SODIUM 70 MG PO TABS: 70.0000 mg | ORAL_TABLET | ORAL | 3 refills | Status: DC

## 2018-10-01 NOTE — Assessment & Plan Note (Addendum)
Femur fracture: Recuperating, currently at home, doing outpatient PT.  Refill hydrocodone.  Handicap parking sign HTN:  Seems well controlled, continue amlodipine, Tenormin, check a CMP DM: Currently on insulin and Victoza, not on metformin as recommended at the SNF.  Ambulatory blood sugars morning or afternoon ~ 113, 117.  No change for now, last A1c was very good.  Needs to see Dr. Dwyane Dee.  Patient will call High cholesterol: On Lipitor, check a FLP CLL: Check a CBC Osteoporosis: T score -0.8 (2017) given recent fracture after a fall  she will benefit from treatment, will start with Fosamax (creatinine clearance 37.5 still okay for biphosphonates) denies persistent problems with difficulty swallowing.  Precautions carefully discussed with the patient who verbalized understanding CAD: On aspirin, Plavix, no symptoms. Flu shot today RTC 3 months

## 2018-10-01 NOTE — Progress Notes (Signed)
Subjective:    Patient ID: [place], female    DOB: 1940/02/09, 78 y.o.   MRN: [id number]  DOS:  10/01/2018 Type of visit - description: Hospital follow-up Admitted to the hospital 08/27/2018, discharge to [facility] 08/31/2018.  Was d/c home 09/19/2018.  She was admitted due to a left femur fracture, had a ORIF 08/28/2018. Creatinine was temporarily increased, felt to be prerenal.  She was restarted on aspirin and Plavix for CAD.  Was discharge from SNF with the recommendation of hold metformin.  Review of Systems In general she feels that she is making progress with outpatient PT Still her energy is not completely back Pain is managed with hydrocodone which she does not need every day Denies fever chills No chest pain or difficulty breathing No lower extremity edema   Past Medical History:  Diagnosis Date  . Acute blood loss anemia 09/17/2015  . Allergy   . Anemia   . Anginal pain ([place]) 2017  . Anxiety   . Arthritis    "hands and knees mostly" (09/16/2015)  . B12 deficiency anemia   . CAD (coronary artery disease)    Dr Gwenlyn Found  . Cataract    bil cataracts removed  . CLL (chronic lymphocytic leukemia) ([place]) 02/25/2014  . Clotting disorder ([place])   . Depression   . DJD (degenerative joint disease)   . 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 Gastritis   . GERD (gastroesophageal reflux disease)   . Headache(784.0)   . History of blood transfusion    "I"ve had 20 some; thru birth of children, loss of blood, last 2 were in ~ 1999 before my cancer surgery" (09/16/2015)  . History of hiatal hernia   . History of shingles   . Hx of colonic polyps   . Hyperlipidemia   . Hypertension   . 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 Myocardial infarction ([place])    1997  . Pneumonia    X 1  . Postoperative hematoma involving circulatory system following cardiac catheterization 09/17/2015  . S/P cardiac cath 09/16/15 09/17/2015  . Type II diabetes mellitus  ([place])   . 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 Venous insufficiency     Past Surgical History:  Procedure Laterality Date  . CARDIAC CATHETERIZATION  1997; 2008; 09/16/2015  . CARDIAC CATHETERIZATION N/A 09/16/2015   Procedure: Right/Left Heart Cath and Coronary/Graft Angiography;  Surgeon: Lorretta Harp, MD;  Location: [place] CV LAB;  Service: Cardiovascular;  Laterality: N/A;  . CESAREAN SECTION  1966  . COLONOSCOPY    . CORONARY ARTERY BYPASS GRAFT  1997   CABG X5  . EYE SURGERY     2 laser surgeries on left eye with implant  . EYE SURGERY Bilateral    cataracts  . FEMUR IM NAIL Left 08/28/2018   Procedure: INTRAMEDULLARY (IM) NAIL FEMORAL;  Surgeon: Rod Can, MD;  Location: [facility] ORS;  Service: Orthopedics;  Laterality: Left;  . FRACTURE SURGERY    . HERNIA REPAIR    . LAPAROSCOPIC ASSISTED VENTRAL HERNIA REPAIR  09/2008    with incarcerated colon;  Dr. Lucia Gaskins  . MOHS SURGERY  06/2016   BCC  . PATELLA FRACTURE SURGERY Left 11/1994   "crushed my knee"; put in a plate & 6 screws"  . POLYPECTOMY    . REFRACTIVE SURGERY Left 07/30/2015  . 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 March Rummage  . SHOULDER  ARTHROSCOPY W/ ROTATOR CUFF REPAIR Right 1997  . SMALL INTESTINE SURGERY    . TONSILLECTOMY  1960s  . TOTAL KNEE ARTHROPLASTY WITH HARDWARE REMOVAL Left 12/1994    (infex, hardware removed )  . TUBAL LIGATION  1966    Social History   Socioeconomic History  . Marital status: Widowed    Spouse name: Not on file  . Number of children: 2  . Years of education: Not on file  . Highest education level: Not on file  Occupational History  . Occupation: retired-- Research officer, trade union: RETIRED  Social Needs  . Financial resource strain: Not hard at all  . Food insecurity    Worry: Never true    Inability: Never true  . Transportation needs    Medical: No    Non-medical: No  Tobacco Use  . Smoking status: Never Smoker  .  Smokeless tobacco: Never Used  Substance and Sexual Activity  . Alcohol use: No    Alcohol/week: 0.0 standard drinks  . Drug use: No  . Sexual activity: Not Currently  Lifestyle  . Physical activity    Days per week: 0 days    Minutes per session: 0 min  . Stress: Not at all  Relationships  . Social connections    Talks on phone: More than three times a week    Gets together: More than three times a week    Attends religious service: More than 4 times per year    Active member of club or organization: Patient refused    Attends meetings of clubs or organizations: Patient refused    Relationship status: Patient refused  . Intimate partner violence    Fear of current or ex partner: No    Emotionally abused: No    Physically abused: No    Forced sexual activity: No  Other Topics Concern  . Not on file  Social History Narrative   Lives by herself, lost husband ~ 2004    1 child in [place]   I child in [place] as of 10/01/2018      Reactions   Codeine    REACTION: makes her nervous, orTylenol #3   Ibuprofen    REACTION: nervous   Meperidine Hcl    REACTION: nasuea and vomitting   Naproxen Sodium    REACTION: nervous      Medication List       Accurate as of October 01, 2018  9:52 PM. If you have any questions, ask your nurse or doctor.        alendronate 70 MG tablet Commonly known as: FOSAMAX Take 1 tablet (70 mg total) by mouth every 7 (seven) days. Take with a full glass of water on an empty stomach. Remain upright for at least 30 minutes after taking. Started by: Kathlene November, MD   amLODipine 5 MG tablet Commonly known as: NORVASC TAKE 1 TABLET(5 MG) BY MOUTH DAILY What changed: See the new instructions.   aspirin 81 MG chewable tablet Chew 1 tablet (81 mg total) by mouth 2 (two) times daily with a meal.   atenolol 100 MG tablet Commonly known as: TENORMIN Take 1 tablet (100 mg total) by mouth daily.   atorvastatin 20 MG tablet Commonly known  as: LIPITOR TAKE 1 TABLET(20 MG) BY MOUTH DAILY   azelastine 0.1 % nasal spray Commonly known as: ASTELIN Place 2 sprays into both nostrils at bedtime as needed for rhinitis. Use in each  nostril as directed   cholecalciferol 1000 units tablet Commonly known as: VITAMIN D Take 1,000 Units by mouth daily.   clopidogrel 75 MG tablet Commonly known as: PLAVIX Take 1 tablet (75 mg total) by mouth daily.   clorazepate 7.5 MG tablet Commonly known as: TRANXENE Take 1 tablet (7.5 mg total) by mouth as needed.   fluticasone 50 MCG/ACT nasal spray Commonly known as: FLONASE SHAKE LIQUID AND USE 2 SPRAYS IN EACH NOSTRIL DAILY What changed: See the new instructions.   HYDROcodone-acetaminophen 5-325 MG tablet Commonly known as: NORCO/VICODIN Take 1-2 tablets by mouth 2 (two) times daily as needed for moderate pain or severe pain. What changed:   how much to take  when to take this Changed by: Kathlene November, MD   insulin NPH Human 100 UNIT/ML injection Commonly known as: NOVOLIN N Inject 38 units into the skin every morning and inject 20 units into the skin at bedtime.   insulin regular 100 units/mL injection Commonly known as: NOVOLIN R Inject 14 Units into the skin 2 (two) times daily before a meal.   INSULIN SYRINGE .5CC/31GX5/16" 31G X 5/16" 0.5 ML Misc USE TO INJECT INSULIN 5 TIMES PER DAY   isosorbide mononitrate 30 MG 24 hr tablet Commonly known as: IMDUR TAKE 1 TABLET(30 MG) BY MOUTH DAILY What changed: See the new instructions.   liraglutide 18 MG/3ML Sopn Commonly known as: Victoza ADMINISTER 1.2 MG UNDER THE SKIN EVERY DAY What changed:   how much to take  how to take this  when to take this  additional instructions   methocarbamol 750 MG tablet Commonly known as: Robaxin-750 Take 1 tablet (750 mg total) by mouth every 8 (eight) hours as needed for muscle spasms.   mometasone 0.1 % cream Commonly known as: ELOCON Apply 1 application topically daily. Use  as directed   multivitamin capsule Take 1 capsule by mouth daily.   nitroGLYCERIN 0.3 MG SL tablet Commonly known as: Nitrostat Place 1 tablet (0.3 mg total) under the tongue every 5 (five) minutes as needed for chest pain (ER if no better after 3 tablets).   omeprazole 20 MG capsule Commonly known as: PRILOSEC Take 1 capsule (20 mg total) by mouth daily.   ONE TOUCH ULTRA SYSTEM KIT w/Device Kit 1 kit by Does not apply route once.   OneTouch Ultra test strip Generic drug: glucose blood Use Onetouch Ultra test strips as instructed to check blood sugar twice daily.           Objective:   Physical Exam BP (!) 135/55 (BP Location: Left Arm, Patient Position: Sitting, Cuff Size: Small)   Pulse 73   Temp (!) 97.3 F (36.3 C) (Temporal)   Resp 16   Ht 5' 6" (1.676 m)   Wt 161 lb 2 oz (73.1 kg)   SpO2 96%   BMI 26.01 kg/m  General:   Well developed, NAD, BMI noted. HEENT:  Normocephalic . Face symmetric, atraumatic Lungs:  CTA B Normal respiratory effort, no intercostal retractions, no accessory muscle use. Heart: RRR,  no murmur.  No pretibial edema bilaterally  Skin: Not pale. Not jaundice Neurologic:  alert & oriented X3.  Speech normal, gait: Uses a walker.  Seems to be doing well Psych--  Cognition and judgment appear intact.  Cooperative with normal attention span and concentration.  Behavior appropriate. No anxious or depressed appearing.      Assessment     Assessment DM  Dr Dwyane Dee  +retinopathy  HTN Hyperlipidemia CRI  creat ~1.4  (GFR ~ 38) Depression/anxiety: tranxene qhs prn (takes rarely) MSK: -DJD: hydrocodone (rx pcp) - T score -0.8 on December 2017, h/o a foot FX d/t walking years ago (likely a stress fracture), discussed treatment 04/2016:  exercise and cont Vit d Hem/Onc: Dr Marin Olp  -CLL -iron deficiency anemia: IV iron 2018 -B12 def CAD, Dr Gwenlyn Found MI 97 >> CABG, cath 12-13-2006, myoview 2012 no ischemic CP: Stress test 08/05/2015,  indeterminate risk study, + lateral ischemia: Cardiac catheterization 09/16/2015: Rx medical Venous insuff GI:  Dr Silverio Decamp ---GERD, IBS, h/o Gastritis ---H/o ulcerative colitis as a child H/o shingles  BCC Dr Allyson Sabal, bx 04-2015, MOH's 06-2016   PLAN Femur fracture: Recuperating, currently at home, doing outpatient PT.  Refill hydrocodone.  Handicap parking sign HTN:  Seems well controlled, continue amlodipine, Tenormin, check a CMP DM: Currently on insulin and Victoza, not on metformin as recommended at the SNF.  Ambulatory blood sugars morning or afternoon ~ 113, 117.  No change for now, last A1c was very good.  Needs to see Dr. Dwyane Dee.  Patient will call High cholesterol: On Lipitor, check a FLP CLL: Check a CBC Osteoporosis: T score -0.8 (2017) given recent fracture after a fall  she will benefit from treatment, will start with Fosamax (creatinine clearance 37.5 still okay for biphosphonates) denies persistent problems with difficulty swallowing.  Precautions carefully discussed with the patient who verbalized understanding CAD: On aspirin, Plavix, no symptoms. Flu shot today RTC 3 months

## 2018-10-01 NOTE — Telephone Encounter (Signed)
Noted, consistent with previous results (CLL)

## 2018-10-01 NOTE — Patient Instructions (Signed)
GO TO THE LAB : Get the blood work     GO TO THE FRONT DESK Schedule your next appointment   for checkup in 3 months  Please to start Fosamax (alendronate) 1 tablet weekly.  Take it on empty stomach with 2 glasses of water, stay seated in your chair and do not go back to bed or eat for 45 minutes.    Alendronate tablets What is this medicine? ALENDRONATE (a LEN droe nate) slows calcium loss from bones. It helps to make normal healthy bone and to slow bone loss in people with Paget's disease and osteoporosis. It may be used in others at risk for bone loss. This medicine may be used for other purposes; ask your health care provider or pharmacist if you have questions. COMMON BRAND NAME(S): Fosamax What should I tell my health care provider before I take this medicine? They need to know if you have any of these conditions:  dental disease  esophagus, stomach, or intestine problems, like acid reflux or GERD  kidney disease  low blood calcium  low vitamin D  problems sitting or standing 30 minutes  trouble swallowing  an unusual or allergic reaction to alendronate, other medicines, foods, dyes, or preservatives  pregnant or trying to get pregnant  breast-feeding How should I use this medicine? You must take this medicine exactly as directed or you will lower the amount of the medicine you absorb into your body or you may cause yourself harm. Take this medicine by mouth first thing in the morning, after you are up for the day. Do not eat or drink anything before you take your medicine. Swallow the tablet with a full glass (6 to 8 fluid ounces) of plain water. Do not take this medicine with any other drink. Do not chew or crush the tablet. After taking this medicine, do not eat breakfast, drink, or take any medicines or vitamins for at least 30 minutes. Sit or stand up for at least 30 minutes after you take this medicine; do not lie down. Do not take your medicine more often than  directed. Talk to your pediatrician regarding the use of this medicine in children. Special care may be needed. Overdosage: If you think you have taken too much of this medicine contact a poison control center or emergency room at once. NOTE: This medicine is only for you. Do not share this medicine with others. What if I miss a dose? If you miss a dose, do not take it later in the day. Continue your normal schedule starting the next morning. Do not take double or extra doses. What may interact with this medicine?  aluminum hydroxide  antacids  aspirin  calcium supplements  drugs for inflammation like ibuprofen, naproxen, and others  iron supplements  magnesium supplements  vitamins with minerals This list may not describe all possible interactions. Give your health care provider a list of all the medicines, herbs, non-prescription drugs, or dietary supplements you use. Also tell them if you smoke, drink alcohol, or use illegal drugs. Some items may interact with your medicine. What should I watch for while using this medicine? Visit your doctor or health care professional for regular checks ups. It may be some time before you see benefit from this medicine. Do not stop taking your medicine except on your doctor's advice. Your doctor or health care professional may order blood tests and other tests to see how you are doing. You should make sure you get enough calcium and  vitamin D while you are taking this medicine, unless your doctor tells you not to. Discuss the foods you eat and the vitamins you take with your health care professional. Some people who take this medicine have severe bone, joint, and/or muscle pain. This medicine may also increase your risk for a broken thigh bone. Tell your doctor right away if you have pain in your upper leg or groin. Tell your doctor if you have any pain that does not go away or that gets worse. This medicine can make you more sensitive to the sun. If  you get a rash while taking this medicine, sunlight may cause the rash to get worse. Keep out of the sun. If you cannot avoid being in the sun, wear protective clothing and use sunscreen. Do not use sun lamps or tanning beds/booths. What side effects may I notice from receiving this medicine? Side effects that you should report to your doctor or health care professional as soon as possible:  allergic reactions like skin rash, itching or hives, swelling of the face, lips, or tongue  black or tarry stools  bone, muscle or joint pain  changes in vision  chest pain  heartburn or stomach pain  jaw pain, especially after dental work  pain or trouble when swallowing  redness, blistering, peeling or loosening of the skin, including inside the mouth Side effects that usually do not require medical attention (report to your doctor or health care professional if they continue or are bothersome):  changes in taste  diarrhea or constipation  eye pain or itching  headache  nausea or vomiting  stomach gas or fullness This list may not describe all possible side effects. Call your doctor for medical advice about side effects. You may report side effects to FDA at 1-800-FDA-1088. Where should I keep my medicine? Keep out of the reach of children. Store at room temperature of 15 and 30 degrees C (59 and 86 degrees F). Throw away any unused medicine after the expiration date. NOTE: This sheet is a summary. It may not cover all possible information. If you have questions about this medicine, talk to your doctor, pharmacist, or health care provider.  2020 Elsevier/Gold Standard (2010-06-17 08:56:09)

## 2018-10-01 NOTE — Telephone Encounter (Signed)
"  CRITICAL VALUE STICKER  CRITICAL VALUE:White count 53.8  RECEIVER (on-site recipient of call):Kristy P  DATE & TIME NOTIFIED: 10-01-18 1621  MESSENGER (representative from lab):Karen  MD NOTIFIED: Larose Kells  TIME OF NOTIFICATION:1624  RESPONSE:

## 2018-10-01 NOTE — Progress Notes (Signed)
Pre visit review using our clinic review tool, if applicable. No additional management support is needed unless otherwise documented below in the visit note. 

## 2018-10-02 DIAGNOSIS — M25552 Pain in left hip: Secondary | ICD-10-CM | POA: Diagnosis not present

## 2018-10-04 DIAGNOSIS — M25552 Pain in left hip: Secondary | ICD-10-CM | POA: Diagnosis not present

## 2018-10-07 ENCOUNTER — Other Ambulatory Visit: Payer: Self-pay | Admitting: *Deleted

## 2018-10-07 DIAGNOSIS — M25552 Pain in left hip: Secondary | ICD-10-CM | POA: Diagnosis not present

## 2018-10-07 NOTE — Patient Outreach (Signed)
[facility]) Care Management  10/07/2018  [place] Dec 15, 1940 [id number]   Subjective: Telephone call to patient's home number, spoke with patient, and HIPAA verified.  Discussed [facility] Care Management Medicare EMMI General Discharge Red Alert Flag follow up / complex case management follow up, patient voiced understanding, and is in agreement to follow up.  RNCM advised patient this RNCM will be taking over as assigned RNCM from previous assigned RNCM [facility]), patient voiced understanding, and is in agreement.   Patient states she is doing ok, had follow up appointment with primary MD on 10/01/2018,  and appointment with well.   States her son will be taking her to outpatient physical therapy in approximately 1 hour, she continues to ambulate with walker, and has had no falls since hospitalization.   States she loves going to therapy, it wears her out, but it is very good for her, and she is making great progress. States she was very active in her activities of daily living and home management prior to fall, and is planning to resume activities when appropriate, will continue to maintain safety.  States her family is very supportive and monitors her safety.   Patient states she does not have any Air traffic [employer], transportation, [employer], or pharmacy needs at this time.  States she is very appreciative of the follow up and is in agreement to receive [place] Management services.      Objective: Per KPN (Knowledge Performance Now, point of care tool) and chart review, patient hospitalized 08/27/2018 - 08/31/2018 for Intertrochanteric fracture of left femur, closed, status post Intramedullary fixation, Left femur on 08/28/2018.  Patient inpatient 08/31/2018 - 09/19/2018 at [place] facility for rehab.  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      Assessment: Received patient as transfer from [facility]) on 9/292/2020 for 30 day EMMI General Discharge Red Alert /  transition of care follow up / other diagnosis complex case management program follow up.   RNCM will continue to follow up transition of care weekly follow up.    Plan: RNCM will call patient for telephone outreach attempt, within 7 business days, transition of care weekly follow up.      Takirah Binford H. Annia Friendly, BSN, [place] Management [facility] Telephonic CM Phone: [phone] Fax: [phone]

## 2018-10-08 ENCOUNTER — Other Ambulatory Visit: Payer: Self-pay | Admitting: Internal Medicine

## 2018-10-08 NOTE — Telephone Encounter (Signed)
Please advise 

## 2018-10-08 NOTE — Telephone Encounter (Signed)
Copied from [place] [id number]. Topic: Quick Communication - Rx Refill/Question >> Oct 08, 2018  4:07 PM Leward Quan A wrote: Medication: methocarbamol (ROBAXIN-750) 750 MG tablet   Patient say that her leg is giving her a hard time and need this ASAP  Has the patient contacted their pharmacy? Yes.   (Agent: If no, request that the patient contact the pharmacy for the refill.) (Agent: If yes, when and what did the pharmacy advise?)  Preferred Pharmacy (with phone number or street name): [facility] [place], [place] [phone] (Phone) [phone] (Fax)    Agent: Please be advised that RX refills may take up to 3 business days. We ask that you follow-up with your pharmacy.

## 2018-10-09 MED ORDER — METHOCARBAMOL 750 MG PO TABS: 750.0000 mg | ORAL_TABLET | Freq: Three times a day (TID) | ORAL | 0 refills | Status: DC | PRN

## 2018-10-09 NOTE — Telephone Encounter (Signed)
This medication was previously prescribed by a different provider. Call patient : Advise I did refill her medication but if the pain is different or more severe she needs to be seen.

## 2018-10-09 NOTE — Telephone Encounter (Signed)
This was prescribed when she was in the hospital for her broken femur.

## 2018-10-10 DIAGNOSIS — M25552 Pain in left hip: Secondary | ICD-10-CM | POA: Diagnosis not present

## 2018-10-11 DIAGNOSIS — M25562 Pain in left knee: Secondary | ICD-10-CM | POA: Diagnosis not present

## 2018-10-11 DIAGNOSIS — M1712 Unilateral primary osteoarthritis, left knee: Secondary | ICD-10-CM | POA: Diagnosis not present

## 2018-10-11 DIAGNOSIS — S72142D Displaced intertrochanteric fracture of left femur, subsequent encounter for closed fracture with routine healing: Secondary | ICD-10-CM | POA: Diagnosis not present

## 2018-10-14 ENCOUNTER — Other Ambulatory Visit: Payer: Self-pay | Admitting: *Deleted

## 2018-10-14 ENCOUNTER — Encounter: Payer: Self-pay | Admitting: *Deleted

## 2018-10-14 ENCOUNTER — Ambulatory Visit: Payer: Self-pay | Admitting: *Deleted

## 2018-10-14 DIAGNOSIS — M25552 Pain in left hip: Secondary | ICD-10-CM | POA: Diagnosis not present

## 2018-10-14 NOTE — Patient Outreach (Signed)
[facility]) Care Management [place], Transition of Care day 25 Post- SNF discharge day # 25 Unsuccessful (consecutive) outreach attempt # 1- previously engaged patient  10/14/2018  [place] 22-Apr-1940 [id number]  11:10 am:  Unsuccessful telephone outreach to [employer], 78 y/o female referred to [place] by [facility] for transition of care after patient experienced recent hospitalization for mechanical fall/ fractured femur; patient was discharged to SNF for rehabilitation and was subsequently discharged home from SNF to self-care on September 19, 2018.  Patient has history including, but not limited to, IDDM; HTN/ HLD; CAD with previous MI/ CABG; CLL; DJD; and CKD- III.  Call placed to patient in coverage for primary [facility] RN CM Darlene.  HIPAA compliant voice mail message left for patient, requesting return call back.  Plan:  Did not received call back from patient by end of business day; will make primary [facility] RN CM aware of today's unsuccessful outreach attempt, so call may be re-attempted within 4 business days  Oneta Rack, RN, BSN, [employer] Coordinator [facility] Care Management  [phone]

## 2018-10-16 ENCOUNTER — Other Ambulatory Visit: Payer: Self-pay | Admitting: *Deleted

## 2018-10-16 NOTE — Patient Outreach (Signed)
[name]) Care Management  10/16/2018  [name] January 30, 1940 [id number]   Subjective: Telephone call to patient's home times 2 , no answer, line busy, and unable to leave a message.    Objective: Per KPN (Knowledge Performance Now, point of care tool) and chart review, patient hospitalized 08/27/2018 - 08/31/2018 for Intertrochanteric fracture of left femur, closed, status post Intramedullary fixation,Leftfemur on 08/28/2018.  Patient inpatient 08/31/2018 - 09/19/2018 at [place] facility for rehab.  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      Assessment: Received patient as transfer from [facility]) on 9/292/2020 for 30 day EMMI General Discharge Red Alert /  transition of care follow up / other diagnosis complex case management program follow up.   RNCM will continue to follow up transition of care weekly follow up, pending patient contact.    Plan: RNCM will send unsuccessful outreach  letter, [facility] pamphlet, will call patient for 3rd telephone outreach attempt within 4 business days, transition of care weekly follow up, and will proceed with case closure within 10 business days if no return call.     Maybelle Depaoli H. Annia Friendly, BSN, [place] Management [facility] Telephonic CM Phone: [phone] Fax: [phone]

## 2018-10-18 ENCOUNTER — Other Ambulatory Visit: Payer: Self-pay | Admitting: *Deleted

## 2018-10-18 DIAGNOSIS — M25552 Pain in left hip: Secondary | ICD-10-CM | POA: Diagnosis not present

## 2018-10-18 NOTE — Patient Outreach (Signed)
[facility]) Care Management  10/18/2018  [name] 02-24-1940 [id number]   Subjective: Telephone call to patient's home  number, no answer, left HIPAA compliant voicemail message, and requested call back.   Objective:Per KPN (Knowledge Performance Now, point of care tool) and chart review,patient hospitalized 08/27/2018 - 08/31/2018 forIntertrochanteric fracture of left femur, closed, status postIntramedullary fixation,Leftfemuron 08/28/2018. Patient inpatient 08/31/2018 - 09/19/2018 at [place] for rehab. Patient also has a history of arthritis, B12 deficiency anemia, CAD,CLL (chronic lymphocytic leukemia),Clotting disorder,DJD (degenerative joint disease),Dupuytren's contracture of both hands,GERD,shingles,hiatal hernia,colonic polyps,Hyperlipidemia, Malignant neoplasm of small intestine,Myocardial infarctionwithCABGx 5(1997), diabetes, ChronicKidneyDiseasestage III, andUlcerative colitisas a child.     Assessment: Received patient as transfer from [facility]) on 9/292/2020 for 30 day EMMI General Discharge Red Alert / transition of care follow up / other diagnosis complex case management program follow up. RNCM will continue to follow up transition of care weekly follow up, pending patient contact.    Plan: RNCM has sent unsuccessful outreach  letter, [facility] pamphlet, and will proceed with case closure within 10 business days if no return call.     [name], BSN, [place] Management [facility] Telephonic CM Phone: [phone] Fax: [phone]

## 2018-10-25 ENCOUNTER — Other Ambulatory Visit: Payer: Self-pay | Admitting: *Deleted

## 2018-10-25 NOTE — Patient Outreach (Signed)
[facility]) Care Management  10/25/2018  [name] 01/03/1941 [id number]   No response from patient outreach attempts will proceed with case closure.     Objective:Per KPN (Knowledge Performance Now, point of care tool) and chart review,patient hospitalized 08/27/2018 - 08/31/2018 forIntertrochanteric fracture of left femur, closed, status postIntramedullary fixation,Leftfemuron 08/28/2018. Patient inpatient 08/31/2018 - 09/19/2018 at [place] for rehab. Patient also has a history of arthritis, B12 deficiency anemia, CAD,CLL (chronic lymphocytic leukemia),Clotting disorder,DJD (degenerative joint disease),Dupuytren's contracture of both hands,GERD,shingles,hiatal hernia,colonic polyps,Hyperlipidemia, Malignant neoplasm of small intestine,Myocardial infarctionwithCABGx 5(1997), diabetes, ChronicKidneyDiseasestage III, andUlcerative colitisas a child.     Assessment: Received patient as transfer from [facility]) on 9/292/2020 for 30 day EMMI General Discharge Red Alert / transition of care follow up / other diagnosis complex case management program follow up. Transition of care weekly follow up not completed due to patient unable to contact.     Plan:Case closure due to unable to reach.  RNCM will send MD case closure letter.  RNCM will send patient case closure letter.      Lavonte Palos H. Annia Friendly, BSN, [place] Management [facility] Telephonic CM Phone: [phone] Fax: [phone]

## 2018-10-26 ENCOUNTER — Other Ambulatory Visit: Payer: Self-pay | Admitting: Endocrinology

## 2018-10-28 DIAGNOSIS — M25552 Pain in left hip: Secondary | ICD-10-CM | POA: Diagnosis not present

## 2018-10-31 ENCOUNTER — Other Ambulatory Visit: Payer: Self-pay | Admitting: Internal Medicine

## 2018-10-31 DIAGNOSIS — M25552 Pain in left hip: Secondary | ICD-10-CM | POA: Diagnosis not present

## 2018-10-31 NOTE — Telephone Encounter (Signed)
Hydrocodone refill.   Last OV: 10/01/2018 Last Fill: 10/01/2018 #40 and 0RF Pt sig: 1-2 tabs bid prn  UDS: 02/19/2018 Low risk

## 2018-10-31 NOTE — Telephone Encounter (Signed)
PDMP reviewed: Patient receive 30 tablets 09/24/2018, then 40 tablets on 10/01/2018.  Please call patient, how many tablets of hydrocodone is she actually taking daily? Advised that only one MD doctor should be prescribing her pain medication moving forward.  She likes to be me or her orthopedic doctor?

## 2018-10-31 NOTE — Telephone Encounter (Signed)
Medication Refill - Medication:  HYDROcodone-acetaminophen (NORCO/VICODIN) 5-325 MG   Has the patient contacted their pharmacy?  Yes advised to call office.   Preferred Pharmacy (with phone number or street name):  [place], [place] [phone] (Phone) [phone] (Fax)   Agent: Please be advised that RX refills may take up to 3 business days. We ask that you follow-up with your pharmacy.

## 2018-11-02 ENCOUNTER — Other Ambulatory Visit: Payer: Self-pay | Admitting: Gastroenterology

## 2018-11-04 ENCOUNTER — Other Ambulatory Visit: Payer: Self-pay | Admitting: Internal Medicine

## 2018-11-04 DIAGNOSIS — M25552 Pain in left hip: Secondary | ICD-10-CM | POA: Diagnosis not present

## 2018-11-04 NOTE — Telephone Encounter (Signed)
Tried calling Pt- line busy. Will try again later.

## 2018-11-04 NOTE — Telephone Encounter (Signed)
Tried calling Pt again- no answer. Will deny medication at this time until we can receive response.

## 2018-11-07 DIAGNOSIS — M25552 Pain in left hip: Secondary | ICD-10-CM | POA: Diagnosis not present

## 2018-11-12 DIAGNOSIS — M25552 Pain in left hip: Secondary | ICD-10-CM | POA: Diagnosis not present

## 2018-11-14 DIAGNOSIS — M25552 Pain in left hip: Secondary | ICD-10-CM | POA: Diagnosis not present

## 2018-11-19 DIAGNOSIS — M25552 Pain in left hip: Secondary | ICD-10-CM | POA: Diagnosis not present

## 2018-11-21 ENCOUNTER — Other Ambulatory Visit: Payer: Self-pay | Admitting: Endocrinology

## 2018-11-21 DIAGNOSIS — M25552 Pain in left hip: Secondary | ICD-10-CM | POA: Diagnosis not present

## 2018-11-22 DIAGNOSIS — M25562 Pain in left knee: Secondary | ICD-10-CM | POA: Diagnosis not present

## 2018-11-22 DIAGNOSIS — M1712 Unilateral primary osteoarthritis, left knee: Secondary | ICD-10-CM | POA: Diagnosis not present

## 2018-11-22 DIAGNOSIS — S72142D Displaced intertrochanteric fracture of left femur, subsequent encounter for closed fracture with routine healing: Secondary | ICD-10-CM | POA: Diagnosis not present

## 2018-11-25 ENCOUNTER — Other Ambulatory Visit (INDEPENDENT_AMBULATORY_CARE_PROVIDER_SITE_OTHER): Payer: Medicare Other

## 2018-11-25 ENCOUNTER — Other Ambulatory Visit: Payer: Self-pay

## 2018-11-25 DIAGNOSIS — E1165 Type 2 diabetes mellitus with hyperglycemia: Secondary | ICD-10-CM

## 2018-11-25 DIAGNOSIS — Z794 Long term (current) use of insulin: Secondary | ICD-10-CM | POA: Diagnosis not present

## 2018-11-25 LAB — COMPREHENSIVE METABOLIC PANEL
ALT: 12 U/L (ref 0–35)
AST: 17 U/L (ref 0–37)
Albumin: 3.6 g/dL (ref 3.5–5.2)
Alkaline Phosphatase: 95 U/L (ref 39–117)
BUN: 17 mg/dL (ref 6–23)
CO2: 25 mEq/L (ref 19–32)
Calcium: 9.1 mg/dL (ref 8.4–10.5)
Chloride: 109 mEq/L (ref 96–112)
Creatinine, Ser: 1.22 mg/dL — ABNORMAL HIGH (ref 0.40–1.20)
GFR: 42.49 mL/min — ABNORMAL LOW (ref >60.00)
Glucose, Bld: 113 mg/dL — ABNORMAL HIGH (ref 70–99)
Potassium: 4.3 mEq/L (ref 3.5–5.1)
Sodium: 142 mEq/L (ref 135–145)
Total Bilirubin: 0.3 mg/dL (ref 0.2–1.2)
Total Protein: 6.7 g/dL (ref 6.0–8.3)

## 2018-11-25 LAB — LIPID PANEL
Cholesterol: 116 mg/dL (ref 0–200)
HDL: 36.9 mg/dL — ABNORMAL LOW (ref >39.00)
LDL Cholesterol: 49 mg/dL (ref 0–99)
NonHDL: 79.21
Total CHOL/HDL Ratio: 3
Triglycerides: 150 mg/dL — ABNORMAL HIGH (ref 0.0–149.0)
VLDL: 30 mg/dL (ref 0.0–40.0)

## 2018-11-25 LAB — HEMOGLOBIN A1C: Hgb A1c MFr Bld: 6 % (ref 4.6–6.5)

## 2018-12-02 ENCOUNTER — Other Ambulatory Visit: Payer: Self-pay

## 2018-12-02 MED ORDER — VICTOZA 18 MG/3ML ~~LOC~~ SOPN: PEN_INJECTOR | SUBCUTANEOUS | 2 refills | Status: DC

## 2018-12-05 DIAGNOSIS — M25562 Pain in left knee: Secondary | ICD-10-CM | POA: Diagnosis not present

## 2018-12-09 ENCOUNTER — Other Ambulatory Visit: Payer: Self-pay

## 2018-12-09 ENCOUNTER — Encounter: Payer: Self-pay | Admitting: Endocrinology

## 2018-12-09 ENCOUNTER — Ambulatory Visit (INDEPENDENT_AMBULATORY_CARE_PROVIDER_SITE_OTHER): Payer: Medicare Other | Admitting: Endocrinology

## 2018-12-09 DIAGNOSIS — Z794 Long term (current) use of insulin: Secondary | ICD-10-CM | POA: Diagnosis not present

## 2018-12-09 DIAGNOSIS — E1165 Type 2 diabetes mellitus with hyperglycemia: Secondary | ICD-10-CM

## 2018-12-09 LAB — URINALYSIS, ROUTINE W REFLEX MICROSCOPIC
Bilirubin Urine: NEGATIVE
Hgb urine dipstick: NEGATIVE
Ketones, ur: NEGATIVE
Leukocytes,Ua: NEGATIVE
Nitrite: NEGATIVE
RBC / HPF: NONE SEEN (ref 0–?)
Specific Gravity, Urine: 1.015 (ref 1.000–1.030)
Urine Glucose: NEGATIVE
Urobilinogen, UA: 0.2 (ref 0.0–1.0)
pH: 5.5 (ref 5.0–8.0)

## 2018-12-09 LAB — MICROALBUMIN / CREATININE URINE RATIO
Creatinine,U: 78.4 mg/dL
Microalb Creat Ratio: 22.6 mg/g (ref 0.0–30.0)
Microalb, Ur: 17.7 mg/dL — ABNORMAL HIGH (ref 0.0–1.9)

## 2018-12-09 MED ORDER — AMLODIPINE BESYLATE 5 MG PO TABS: 5.0000 mg | ORAL_TABLET | Freq: Every day | ORAL | 3 refills | Status: DC

## 2018-12-09 MED ORDER — VICTOZA 18 MG/3ML ~~LOC~~ SOPN: PEN_INJECTOR | SUBCUTANEOUS | 2 refills | Status: DC

## 2018-12-09 NOTE — Patient Instructions (Signed)
Check blood sugars on waking up  days a week  Also check blood sugars about 2 hours after meals and do this after different meals by rotation  Recommended blood sugar levels on waking up are 90-130 and about 2 hours after meal is 130-160  Please bring your blood sugar monitor to each visit, thank you  12-14 Units at dinner if eating less Carbs  18 N at bedtime if sugar < 110 at bedtime

## 2018-12-09 NOTE — Progress Notes (Signed)
Patient ID: [name], female   DOB: Dec 01, 1940, 78 y.o.   MRN: [id number]            Reason for Appointment: Followup for Type 2 Diabetes   History of Present Illness:          Diagnosis: Type 2 diabetes mellitus, date of diagnosis: 1998       Past history:   She was treated with metformin at diagnosis when this had been continued until 2015 Subsequently Amaryl was also added several years ago and this has been continued Her blood sugars were under fair control between 2010 and early 2013 with A1c ranging from 7.4-9.4, mostly under 8% However since 08/2011 her A1c has been mostly over 8%  Insulin was added in 2014 with sma limited bottom ll doses of Lantus and this has been progressively increased She was started on mealtime insulin on her initial consultation in 9/15 because of high postprandial readings, sometimes over 300 With adding Victoza in 02/2014 her blood sugars were somewhat better with A1c coming down below 8%  Recent history:   INSULIN regimen: Regular 16 units before meals twice daily.  NPH 38 units in the morning and 20 hs  Non-insulin hypoglycemic drugs the patient is taking are: Victoza 1.2 mg daily. Metformin 500 in the morning and 1000 in the evening  A1c has been consistently under 7, now 6.0   Current blood sugar patterns, daily management and problems identified:   She has not been seen since 6/20 because of her hip fracture  After that she has been off metformin because of renal dysfunction  However her blood sugars are not any worse with stopping Metformin  Although her appetite is normal she appears to have lost some weight since last visit  Despite reminders she does not check her blood sugars after breakfast or before dinner  Blood sugars overall are averaging about the same as before  Fasting blood sugars: Blood sugars are fairly good without hypoglycemia and mostly near normal No overnight hypoglycemia also   Postprandial  readings:  She has significant variability in her blood sugars Her carbohydrate intake can be variable but also she can have some desserts causing blood sugars to be as high as 299 She also previously had significant fluctuation in the blood sugars based on her diet However if the blood sugars are lower she will reduce her Victoza to 0.6 mg that night  Insulin regimen:  She takes her insulin up to 30 minutes minutes before eating  She takes the same amount of REGULAR insulin for breakfast and dinner without looking at what she is eating  Continues to use insulin syringes for injection  VICTOZA: She has continued to take 1.2 mg daily without any side effects      Dinner is usually at 6-7 pm  Glucose monitoring:  done 1-2 times a day         Glucometer: One Touch.       Blood Glucose readings by time of day from download:   PRE-MEAL Fasting Lunch Dinner Bedtime Overall  Glucose range: 83-131      Mean/median:  107    126   POST-MEAL PC Breakfast PC Lunch PC Dinner  Glucose range:   68-299  Mean/median:   151   Previous readings:  PRE-MEAL Fasting Lunch Dinner Bedtime Overall  Glucose range:  88-315      Mean/median:  119    121   POST-MEAL PC Breakfast PC Lunch PC Dinner  Glucose range:    58-241  Mean/median:    126      Self-care: The diet that the patient has been following is: none, usually eating low fat meals Meals: 2-3 meals per day          Dietician visit, most recent: never.  She saw the CDE in 02/2014              Exercise: Some gardening  Weight history:  Wt Readings from Last 3 Encounters:  12/09/18 168 lb (76.2 kg)  10/01/18 161 lb 2 oz (73.1 kg)  08/28/18 177 lb 14.6 oz (80.7 kg)    Glycemic control:   Lab Results  Component Value Date   HGBA1C 6.0 11/25/2018   HGBA1C 6.1 (H) 08/27/2018   HGBA1C 6.2 05/29/2018   Lab Results  Component Value Date   MICROALBUR 57.4 (H) 05/24/2017   LDLCALC 49 11/25/2018   CREATININE 1.22 (H) 11/25/2018       OTHER active problems  discussed in review of systems   Allergies as of 12/09/2018      Reactions   Codeine    REACTION: makes her nervous, orTylenol #3   Ibuprofen    REACTION: nervous   Meperidine Hcl    REACTION: nasuea and vomitting   Naproxen Sodium    REACTION: nervous      Medication List       Accurate as of December 09, 2018  1:30 PM. If you have any questions, ask your nurse or doctor.        alendronate 70 MG tablet Commonly known as: FOSAMAX Take 1 tablet (70 mg total) by mouth every 7 (seven) days. Take with a full glass of water on an empty stomach. Remain upright for at least 30 minutes after taking.   amLODipine 5 MG tablet Commonly known as: NORVASC Take 1 tablet (5 mg total) by mouth daily. TAKE 1 TABLET(5 MG) BY MOUTH DAILY What changed: See the new instructions. Changed by: Jayme Cloud, LPN   aspirin 81 MG chewable tablet Chew 1 tablet (81 mg total) by mouth 2 (two) times daily with a meal.   atenolol 100 MG tablet Commonly known as: TENORMIN Take 1 tablet (100 mg total) by mouth daily.   atorvastatin 20 MG tablet Commonly known as: LIPITOR TAKE 1 TABLET(20 MG) BY MOUTH DAILY   azelastine 0.1 % nasal spray Commonly known as: ASTELIN Place 2 sprays into both nostrils at bedtime as needed for rhinitis. Use in each nostril as directed   cholecalciferol 1000 units tablet Commonly known as: VITAMIN D Take 1,000 Units by mouth daily.   clopidogrel 75 MG tablet Commonly known as: PLAVIX Take 1 tablet (75 mg total) by mouth daily.   clorazepate 7.5 MG tablet Commonly known as: TRANXENE Take 1 tablet (7.5 mg total) by mouth as needed.   fluticasone 50 MCG/ACT nasal spray Commonly known as: FLONASE SHAKE LIQUID AND USE 2 SPRAYS IN EACH NOSTRIL DAILY What changed: See the new instructions.   HYDROcodone-acetaminophen 5-325 MG tablet Commonly known as: NORCO/VICODIN Take 1-2 tablets by mouth 2 (two) times daily as needed for moderate  pain or severe pain.   insulin NPH Human 100 UNIT/ML injection Commonly known as: NOVOLIN N Inject 38 units into the skin every morning and inject 20 units into the skin at bedtime.   insulin regular 100 units/mL injection Commonly known as: NOVOLIN R Inject 14 Units into the skin 2 (two) times daily before a meal.  INSULIN SYRINGE .5CC/31GX5/16" 31G X 5/16" 0.5 ML Misc USE TO INJECT INSULIN 5 TIMES PER DAY   isosorbide mononitrate 30 MG 24 hr tablet Commonly known as: IMDUR TAKE 1 TABLET(30 MG) BY MOUTH DAILY What changed: See the new instructions.   methocarbamol 750 MG tablet Commonly known as: Robaxin-750 Take 1 tablet (750 mg total) by mouth every 8 (eight) hours as needed for muscle spasms.   mometasone 0.1 % cream Commonly known as: ELOCON Apply 1 application topically daily. Use as directed   multivitamin capsule Take 1 capsule by mouth daily.   nitroGLYCERIN 0.3 MG SL tablet Commonly known as: Nitrostat Place 1 tablet (0.3 mg total) under the tongue every 5 (five) minutes as needed for chest pain (ER if no better after 3 tablets).   omeprazole 20 MG capsule Commonly known as: PRILOSEC TAKE 1 CAPSULE(20 MG) BY MOUTH DAILY   ONE TOUCH ULTRA SYSTEM KIT w/Device Kit 1 kit by Does not apply route once.   OneTouch Ultra test strip Generic drug: glucose blood Use Onetouch Ultra test strips as instructed to check blood sugar twice daily.   Victoza 18 MG/3ML Sopn Generic drug: liraglutide ADMINISTER 1.6 MG UNDER THE SKIN EVERY DAY       Allergies:  Allergies  Allergen Reactions   Codeine     REACTION: makes her nervous, orTylenol #3   Ibuprofen     REACTION: nervous   Meperidine Hcl     REACTION: nasuea and vomitting   Naproxen Sodium     REACTION: nervous    Past Medical History:  Diagnosis Date   Acute blood loss anemia 09/17/2015   Allergy    Anemia    Anginal pain ([place]) 2017   Anxiety    Arthritis    "hands and knees mostly"  (09/16/2015)   B12 deficiency anemia    CAD (coronary artery disease)    Dr Gwenlyn Found   Cataract    bil cataracts removed   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   Clotting disorder ([place])    Depression    DJD (degenerative joint disease)    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   Gastritis    GERD (gastroesophageal reflux disease)    Headache(784.0)    History of blood transfusion    "I"ve had 20 some; thru birth of children, loss of blood, last 2 were in ~ 1999 before my cancer surgery" (09/16/2015)   History of hiatal hernia    History of shingles    Hx of colonic polyps    Hyperlipidemia    Hypertension    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   Myocardial infarction ([place])    1997   Pneumonia    X 1   Postoperative hematoma involving circulatory system following cardiac catheterization 09/17/2015   S/P cardiac cath 09/16/15 09/17/2015   Type II diabetes mellitus ([place])    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[facility])    as a child   Venous insufficiency     Past Surgical History:  Procedure Laterality Date   [place]; 2008; 09/16/2015   CARDIAC CATHETERIZATION N/A 09/16/2015   Procedure: Right/Left Heart Cath and Coronary/Graft Angiography;  Surgeon: Lorretta Harp, MD;  Location: [place] [facility];  Service: Cardiovascular;  Laterality: N/A;   CESAREAN SECTION  1966   COLONOSCOPY     CORONARY ARTERY BYPASS GRAFT  1997   CABG X5   EYE SURGERY     2 laser surgeries on  left eye with implant   EYE SURGERY Bilateral    cataracts   FEMUR IM NAIL Left 08/28/2018   Procedure: INTRAMEDULLARY (IM) NAIL FEMORAL;  Surgeon: Rod Can, MD;  Location: [facility];  Service: Orthopedics;  Laterality: Left;   FRACTURE SURGERY     HERNIA REPAIR     LAPAROSCOPIC ASSISTED VENTRAL HERNIA REPAIR  09/2008    with incarcerated colon;  Dr. Lucia Gaskins   MOHS SURGERY  06/2016   [place] Left 11/1994   "crushed my knee"; put in a plate & 6 screws"   POLYPECTOMY     REFRACTIVE SURGERY Left 07/30/2015   resection of small bowel carcinoma  06/1998   Dr. March Rummage   SHOULDER ARTHROSCOPY W/ ROTATOR CUFF REPAIR Right [place]  1960s   TOTAL KNEE ARTHROPLASTY WITH HARDWARE REMOVAL Left 12/1994    (infex, hardware removed )   TUBAL LIGATION  1966    Family History  Problem Relation Age of Onset   Heart disease Mother 26   Heart disease Father 54       MI   Hypertension Child    Heart attack Child    Diabetes Child    Heart disease Maternal Aunt        x 2, all deceased   Heart disease Maternal Uncle        x 4, all deceased   Emphysema Brother 61   Colon cancer Neg Hx    Breast cancer Neg Hx    Rectal cancer Neg Hx    Stomach cancer Neg Hx     Social History:  reports that she has never smoked. She has never used smokeless tobacco. She reports that she does not drink alcohol or use drugs.    Review of Systems         Lipids: Has had excellent control of hypercholesterolemia with long-term use of Lipitor She has a history of coronary bypass surgery.   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 ([place]) 10/01/2018       Physical Examination:  BP 140/80 (BP Location: Left Arm, Patient Position: Sitting, Cuff Size: Normal)  Pulse 70    Ht 5' 6" (1.676 m)    Wt 168 lb (76.2 kg)    SpO2 98%    BMI 27.12 kg/m     Diabetes type 2, insulin requiring with BMI 29   See history of present illness for detailed discussion of her current blood sugar patterns, management and problems identified  Her A1c is much better at 6.0  Blood sugars are as high as 299 but not clear if A1c is accurate since she is not having any hypoglycemia with lowest blood sugar 68  RENAL insufficiency: Her creatinine which was increased during her hospitalization is nearly normal now Also likely with better blood pressure control her microalbumin is normal  HYPERTENSION: Blood pressure is improved from before  She has had her influenza vaccine   PLAN:   No change in basic insulin doses  However emphasized the need to reduce her dose of insulin at suppertime by 2 to 4 units when eating low carbohydrate meals  Conversely if she is eating dessert or high carbohydrate meals such as banana sandwiches she will go up 2 units  She will not adjust her Victoza at night based on blood sugar  However she can reduce her bedtime NPH by 2 to 4 units if she is afraid of low sugars overnight  There are no Patient Instructions on file for this visit.   Elayne Snare 12/09/2018, 1:30 PM   Note: This office note was prepared with Dragon voice recognition system technology. Any transcriptional errors that result from this process are unintentional.

## 2018-12-11 ENCOUNTER — Telehealth: Payer: Self-pay | Admitting: Endocrinology

## 2018-12-11 ENCOUNTER — Ambulatory Visit: Payer: Medicare Other | Admitting: Cardiovascular Disease

## 2018-12-11 ENCOUNTER — Other Ambulatory Visit: Payer: Self-pay

## 2018-12-11 MED ORDER — VICTOZA 18 MG/3ML ~~LOC~~ SOPN: 1.2000 mg | PEN_INJECTOR | Freq: Every day | SUBCUTANEOUS | 2 refills | Status: DC

## 2018-12-11 NOTE — Telephone Encounter (Signed)
Called pt and notified her that this was done by mistake and it has been corrected.

## 2018-12-11 NOTE — Telephone Encounter (Signed)
Pt wants a call back about her [place] and wants to know why the dose was increased from 1.2 to 1.6

## 2018-12-31 ENCOUNTER — Encounter: Payer: Self-pay | Admitting: Internal Medicine

## 2018-12-31 ENCOUNTER — Other Ambulatory Visit: Payer: Self-pay

## 2018-12-31 ENCOUNTER — Ambulatory Visit (INDEPENDENT_AMBULATORY_CARE_PROVIDER_SITE_OTHER): Payer: Medicare Other | Admitting: Internal Medicine

## 2018-12-31 VITALS — BP 144/71 | HR 70 | Temp 97.5°F

## 2018-12-31 DIAGNOSIS — M8949 Other hypertrophic osteoarthropathy, multiple sites: Secondary | ICD-10-CM

## 2018-12-31 DIAGNOSIS — E1159 Type 2 diabetes mellitus with other circulatory complications: Secondary | ICD-10-CM | POA: Diagnosis not present

## 2018-12-31 DIAGNOSIS — I10 Essential (primary) hypertension: Secondary | ICD-10-CM

## 2018-12-31 DIAGNOSIS — M159 Polyosteoarthritis, unspecified: Secondary | ICD-10-CM

## 2018-12-31 DIAGNOSIS — C9110 Chronic lymphocytic leukemia of B-cell type not having achieved remission: Secondary | ICD-10-CM

## 2018-12-31 NOTE — Progress Notes (Signed)
Subjective:    Patient ID: [name], female    DOB: [birth year], 78 y.o.   MRN: [id number]  DOS:  12/31/2018 Type of visit - description: Routine office visit Since the last office visit, she is doing well. Finished physical therapy for a femur fracture, getting along okay, still having some pain, not taking any medication at the present time   BP Readings from Last 3 Encounters:  12/31/18 (!) 144/71  12/09/18 140/80  10/01/18 (!) 135/55     Review of Systems  Denies fever chills No chest pain no difficulty breathing No nausea, vomiting, diarrhea.  Past Medical History:  Diagnosis Date  . Acute blood loss anemia 09/17/2015  . Allergy   . Anemia   . Anginal pain ([place]) 2017  . Anxiety   . Arthritis    "hands and knees mostly" (09/16/2015)  . B12 deficiency anemia   . CAD (coronary artery disease)    Dr Gwenlyn Found  . Cataract    bil cataracts removed  . 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 Clotting disorder ([place])   . Depression   . DJD (degenerative joint disease)   . 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 Gastritis   . GERD (gastroesophageal reflux disease)   . Headache(784.0)   . History of blood transfusion    "I"ve had 20 some; thru birth of children, loss of blood, last 2 were in ~ 1999 before my cancer surgery" (09/16/2015)  . History of hiatal hernia   . History of shingles   . Hx of colonic polyps   . Hyperlipidemia   . Hypertension   . 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 Myocardial infarction ([place])    1997  . Pneumonia    X 1  . Postoperative hematoma involving circulatory system following cardiac catheterization 09/17/2015  . S/P cardiac cath 09/16/15 09/17/2015  . Type II diabetes mellitus ([place])   . 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 Venous insufficiency     Past Surgical History:  Procedure Laterality Date  . CARDIAC CATHETERIZATION  1997; 2008; 09/16/2015  . CARDIAC  CATHETERIZATION N/A 09/16/2015   Procedure: Right/Left Heart Cath and Coronary/Graft Angiography;  Surgeon: Lorretta Harp, MD;  Location: [place] [facility];  Service: Cardiovascular;  Laterality: N/A;  . CESAREAN SECTION  1966  . COLONOSCOPY    . CORONARY ARTERY BYPASS GRAFT  1997   CABG X5  . EYE SURGERY     2 laser surgeries on left eye with implant  . EYE SURGERY Bilateral    cataracts  . FEMUR IM NAIL Left 08/28/2018   Procedure: INTRAMEDULLARY (IM) NAIL FEMORAL;  Surgeon: Rod Can, MD;  Location[facility];  Service: Orthopedics;  Laterality: Left;  . FRACTURE SURGERY    . HERNIA REPAIR    . LAPAROSCOPIC ASSISTED VENTRAL HERNIA REPAIR  09/2008    with incarcerated colon;  Dr. Lucia Gaskins  . MOHS SURGERY  06/2016   BCC  . PATELLA FRACTURE SURGERY Left 11/1994   "crushed my knee"; put in a plate & 6 screws"  . POLYPECTOMY    . REFRACTIVE SURGERY Left 07/30/2015  . 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 March Rummage  . SHOULDER ARTHROSCOPY W/ ROTATOR CUFF REPAIR Right 1997  . SMALL INTESTINE SURGERY    . TONSILLECTOMY  1960s  . TOTAL KNEE ARTHROPLASTY WITH HARDWARE REMOVAL Left 12/1994    (infex, hardware  removed )  . TUBAL LIGATION  1966    Social History   Socioeconomic History  . Marital status: Widowed    Spouse name: Not on file  . Number of children: 2  . Years of education: Not on file  . Highest education level: Not on file  Occupational History  . Occupation: retired-- [employer]     Employer: RETIRED  Tobacco Use  . Smoking status: Never Smoker  . Smokeless tobacco: Never Used  Substance and Sexual Activity  . Alcohol use: No    Alcohol/week: 0.0 standard drinks  . Drug use: No  . Sexual activity: Not Currently  Other Topics Concern  . Not on file  Social History Narrative   Lives by herself, lost husband ~ 2004 , still drives    1 child in [place]   I child in [place]   Social Determinants of Health   Financial Resource Strain: Low Risk   .  Difficulty of Paying Living Expenses: Not hard at all  Food Insecurity: No Food Insecurity  . Worried About Running Out of Food in the Last Year: Never true  . Ran Out of Food in the Last Year: Never true  Transportation Needs: No Transportation Needs  . Lack of Transportation (Medical): No  . Lack of Transportation (Non-Medical): No  Physical Activity: Inactive  . Days of Exercise per Week: 0 days  . Minutes of Exercise per Session: 0 min  Stress: No Stress Concern Present  . Feeling of Stress : Not at all  Social Connections: Unknown  . Frequency of Communication with Friends and Family: More than three times a week  . Frequency of Social Gatherings with Friends and Family: More than three times a week  . Attends Religious Services: More than 4 times per year  . Active Member of Clubs or Organizations: Patient refused  . Attends Club or Organization Meetings: Patient refused  . Marital Status: Patient refused  Intimate Partner Violence: Not At Risk  . Fear of Current or Ex-Partner: No  . Emotionally Abused: No  . Physically Abused: No  . Sexually Abused: No      Allergies as of 12/31/2018      Reactions   Codeine    REACTION: makes her nervous, orTylenol #3   Ibuprofen    REACTION: nervous   Meperidine Hcl    REACTION: nasuea and vomitting   Naproxen Sodium    REACTION: nervous      Medication List       Accurate as of December 31, 2018 11:59 PM. If you have any questions, ask your nurse or doctor.        STOP taking these medications   HYDROcodone-acetaminophen 5-325 MG tablet Commonly known as: NORCO/VICODIN Stopped by:  , MD   methocarbamol 750 MG tablet Commonly known as: Robaxin-750 Stopped by:  , MD     TAKE these medications   alendronate 70 MG tablet Commonly known as: FOSAMAX Take 1 tablet (70 mg total) by mouth every 7 (seven) days. Take with a full glass of water on an empty stomach. Remain upright for at least 30 minutes after  taking.   amLODipine 5 MG tablet Commonly known as: NORVASC Take 1 tablet (5 mg total) by mouth daily. TAKE 1 TABLET(5 MG) BY MOUTH DAILY   aspirin 81 MG chewable tablet Chew 1 tablet (81 mg total) by mouth 2 (two) times daily with a meal.   atenolol 100 MG tablet Commonly known as: TENORMIN Take 1   tablet (100 mg total) by mouth daily.   atorvastatin 20 MG tablet Commonly known as: LIPITOR TAKE 1 TABLET(20 MG) BY MOUTH DAILY   azelastine 0.1 % nasal spray Commonly known as: ASTELIN Place 2 sprays into both nostrils at bedtime as needed for rhinitis. Use in each nostril as directed   cholecalciferol 1000 units tablet Commonly known as: VITAMIN D Take 1,000 Units by mouth daily.   clopidogrel 75 MG tablet Commonly known as: PLAVIX Take 1 tablet (75 mg total) by mouth daily.   clorazepate 7.5 MG tablet Commonly known as: TRANXENE Take 1 tablet (7.5 mg total) by mouth as needed.   fluticasone 50 MCG/ACT nasal spray Commonly known as: FLONASE SHAKE LIQUID AND USE 2 SPRAYS IN EACH NOSTRIL DAILY What changed: See the new instructions.   insulin NPH Human 100 UNIT/ML injection Commonly known as: NOVOLIN N Inject 38 units into the skin every morning and inject 20 units into the skin at bedtime.   insulin regular 100 units/mL injection Commonly known as: NOVOLIN R Inject 14 Units into the skin 2 (two) times daily before a meal.   INSULIN SYRINGE .5CC/31GX5/16" 31G X 5/16" 0.5 ML Misc USE TO INJECT INSULIN 5 TIMES PER DAY   isosorbide mononitrate 30 MG 24 hr tablet Commonly known as: IMDUR TAKE 1 TABLET(30 MG) BY MOUTH DAILY What changed: See the new instructions.   mometasone 0.1 % cream Commonly known as: ELOCON Apply 1 application topically daily. Use as directed   multivitamin capsule Take 1 capsule by mouth daily.   nitroGLYCERIN 0.3 MG SL tablet Commonly known as: Nitrostat Place 1 tablet (0.3 mg total) under the tongue every 5 (five) minutes as needed for  chest pain (ER if no better after 3 tablets).   omeprazole 20 MG capsule Commonly known as: PRILOSEC TAKE 1 CAPSULE(20 MG) BY MOUTH DAILY   ONE TOUCH ULTRA SYSTEM KIT w/Device Kit 1 kit by Does not apply route once.   OneTouch Ultra test strip Generic drug: glucose blood Use Onetouch Ultra test strips as instructed to check blood sugar twice daily.   Victoza 18 MG/3ML Sopn Generic drug: liraglutide Inject 0.2 mLs (1.2 mg total) into the skin daily.           Objective:   Physical Exam BP (!) 144/71 (BP Location: Left Arm, Cuff Size: Large)   Pulse 70   Temp (!) 97.5 F (36.4 C) (Temporal)   SpO2 100%  General:   Well developed, NAD, BMI noted. HEENT:  Normocephalic . Face symmetric, atraumatic Lungs:  CTA B Normal respiratory effort, no intercostal retractions, no accessory muscle use. Heart: RRR,  no murmur.  No pretibial edema bilaterally  Skin: Not pale. Not jaundice Neurologic:  alert & oriented X3.  Speech normal, gait assisted by a cane Psych--  Cognition and judgment appear intact.  Cooperative with normal attention span and concentration.  Behavior appropriate. No anxious or depressed appearing.      Assessment     Assessment DM  Dr Kumar  +retinopathy  HTN Hyperlipidemia CRI creat ~1.4  (GFR ~ 38) Depression/anxiety: tranxene qhs prn (takes rarely) MSK: -DJD: hydrocodone (rx pcp) - T score -0.8 on December 2017, h/o a foot FX d/t walking years ago (likely a stress fracture), discussed treatment 04/2016:  exercise and cont Vit d Hem/Onc: Dr Ennever  -CLL -iron deficiency anemia: IV iron 2018 -B12 def CAD, Dr Berry MI 97 >> CABG, cath 12-13-2006, myoview 2012 no ischemic CP: Stress test 08/05/2015, indeterminate risk   study, + lateral ischemia: Cardiac catheterization 09/16/2015: Rx medical Venous insuff GI:  Dr Nandigam ---GERD, IBS, h/o Gastritis ---H/o ulcerative colitis as a child H/o shingles  BCC Dr Lupton, bx 04-2015, MOH's 06-2016    PLAN DM: Well-controlled, Per Endo HTN: No recent ambulatory BPs, BP today 144/71, last BMP satisfactory, continue amlodipine, Tenormin, Imdur. Osteoporosis: Started Fosamax, good compliance and tolerance. DJD, pain management: Recuperating from a femur fracture, currently taking no pain medications, recommend to take Tylenol 500 mg as needed, see AVS.  Recommend avoid ibuprofen CLL: Due to see hematology, referral sent. RTC 4 months    This visit occurred during the SARS-CoV-2 public health emergency.  Safety protocols were in place, including screening questions prior to the visit, additional usage of staff PPE, and extensive cleaning of exam room while observing appropriate contact time as indicated for disinfecting solutions.     

## 2018-12-31 NOTE — Patient Instructions (Addendum)
  GO TO THE FRONT DESK Schedule your next appointment   For a check up in 4 months     Check the  blood pressure 2 or 3 times a month   BP GOAL is between 110/65 and  135/85. If it is consistently higher or lower, let me know  Tylenol  500 mg OTC 2 tabs a day every 8 hours as needed for pain

## 2019-01-01 NOTE — Assessment & Plan Note (Signed)
DM: Well-controlled, Per Endo HTN: No recent ambulatory BPs, BP today 144/71, last BMP satisfactory, continue amlodipine, Tenormin, Imdur. Osteoporosis: Started Fosamax, good compliance and tolerance. DJD, pain management: Recuperating from a femur fracture, currently taking no pain medications, recommend to take Tylenol 500 mg as needed, see AVS.  Recommend avoid ibuprofen CLL: Due to see hematology, referral sent. RTC 4 months

## 2019-01-07 ENCOUNTER — Inpatient Hospital Stay (HOSPITAL_BASED_OUTPATIENT_CLINIC_OR_DEPARTMENT_OTHER): Payer: Medicare Other | Admitting: Hematology & Oncology

## 2019-01-07 ENCOUNTER — Telehealth: Payer: Self-pay | Admitting: Hematology & Oncology

## 2019-01-07 ENCOUNTER — Other Ambulatory Visit: Payer: Self-pay

## 2019-01-07 ENCOUNTER — Ambulatory Visit: Payer: Medicare Other | Admitting: Hematology

## 2019-01-07 ENCOUNTER — Inpatient Hospital Stay: Payer: Medicare Other | Attending: Hematology

## 2019-01-07 ENCOUNTER — Telehealth: Payer: Self-pay | Admitting: *Deleted

## 2019-01-07 ENCOUNTER — Other Ambulatory Visit: Payer: Medicare Other

## 2019-01-07 ENCOUNTER — Encounter: Payer: Self-pay | Admitting: Hematology & Oncology

## 2019-01-07 ENCOUNTER — Inpatient Hospital Stay: Payer: Medicare Other

## 2019-01-07 VITALS — BP 137/58 | HR 67 | Temp 97.3°F | Resp 18 | Wt 171.8 lb

## 2019-01-07 DIAGNOSIS — K909 Intestinal malabsorption, unspecified: Secondary | ICD-10-CM | POA: Diagnosis not present

## 2019-01-07 DIAGNOSIS — Z79899 Other long term (current) drug therapy: Secondary | ICD-10-CM | POA: Diagnosis not present

## 2019-01-07 DIAGNOSIS — Z794 Long term (current) use of insulin: Secondary | ICD-10-CM | POA: Diagnosis not present

## 2019-01-07 DIAGNOSIS — D509 Iron deficiency anemia, unspecified: Secondary | ICD-10-CM | POA: Diagnosis not present

## 2019-01-07 DIAGNOSIS — Z9181 History of falling: Secondary | ICD-10-CM | POA: Insufficient documentation

## 2019-01-07 DIAGNOSIS — Z7982 Long term (current) use of aspirin: Secondary | ICD-10-CM | POA: Diagnosis not present

## 2019-01-07 DIAGNOSIS — Z85038 Personal history of other malignant neoplasm of large intestine: Secondary | ICD-10-CM | POA: Diagnosis not present

## 2019-01-07 DIAGNOSIS — C9110 Chronic lymphocytic leukemia of B-cell type not having achieved remission: Secondary | ICD-10-CM

## 2019-01-07 LAB — CMP (CANCER CENTER ONLY)
ALT: 13 U/L (ref 0–44)
AST: 17 U/L (ref 15–41)
Albumin: 4.3 g/dL (ref 3.5–5.0)
Alkaline Phosphatase: 90 U/L (ref 38–126)
Anion gap: 8 (ref 5–15)
BUN: 22 mg/dL (ref 8–23)
CO2: 29 mmol/L (ref 22–32)
Calcium: 10.4 mg/dL — ABNORMAL HIGH (ref 8.9–10.3)
Chloride: 106 mmol/L (ref 98–111)
Creatinine: 1.31 mg/dL — ABNORMAL HIGH (ref 0.44–1.00)
GFR, Est AFR Am: 45 mL/min — ABNORMAL LOW (ref >60)
GFR, Estimated: 39 mL/min — ABNORMAL LOW (ref >60)
Glucose, Bld: 96 mg/dL (ref 70–99)
Potassium: 4 mmol/L (ref 3.5–5.1)
Sodium: 143 mmol/L (ref 135–145)
Total Bilirubin: 0.5 mg/dL (ref 0.3–1.2)
Total Protein: 7.1 g/dL (ref 6.5–8.1)

## 2019-01-07 LAB — CBC WITH DIFFERENTIAL (CANCER CENTER ONLY)
Abs Immature Granulocytes: 0.16 10*3/uL — ABNORMAL HIGH (ref 0.00–0.07)
Basophils Absolute: 0.1 10*3/uL (ref 0.0–0.1)
Basophils Relative: 0 %
Eosinophils Absolute: 0.3 10*3/uL (ref 0.0–0.5)
Eosinophils Relative: 0 %
HCT: 38.7 % (ref 36.0–46.0)
Hemoglobin: 12 g/dL (ref 12.0–15.0)
Immature Granulocytes: 0 %
Lymphocytes Relative: 82 %
Lymphs Abs: 51.5 10*3/uL — ABNORMAL HIGH (ref 0.7–4.0)
MCH: 29.1 pg (ref 26.0–34.0)
MCHC: 31 g/dL (ref 30.0–36.0)
MCV: 93.9 fL (ref 80.0–100.0)
Monocytes Absolute: 4.1 10*3/uL — ABNORMAL HIGH (ref 0.1–1.0)
Monocytes Relative: 7 %
Neutro Abs: 6.8 10*3/uL (ref 1.7–7.7)
Neutrophils Relative %: 11 %
Platelet Count: 222 10*3/uL (ref 150–400)
RBC: 4.12 MIL/uL (ref 3.87–5.11)
RDW: 15.4 % (ref 11.5–15.5)
WBC Count: 62.9 10*3/uL (ref 4.0–10.5)
nRBC: 0 % (ref 0.0–0.2)

## 2019-01-07 LAB — SAVE SMEAR(SSMR), FOR PROVIDER SLIDE REVIEW

## 2019-01-07 NOTE — Telephone Encounter (Signed)
Richardson Landry from lab brought a critical lab value to me..white blood cell count is 62.9. Results given to MD.

## 2019-01-07 NOTE — Progress Notes (Signed)
Hematology and Oncology Follow Up Visit  [place] [id number] 1940-04-27 79 y.o. 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  Surprisingly, she was in her backyard back in August.  She fell and broke her left hip.  She had surgery for her left hip on 08/28/2018.  Looks of the surgery went fairly well..  She then went to rehab for a few weeks.  She really looks quite good.  She really has no specific complaints.  She has had no problems with cough or shortness of breath.  She had no problems with healing from the surgery.  She has had no problems with swollen lymph nodes.  There is been no change in bowel or bladder habits.  Her blood sugars have been under fairly decent control.  It sounds like her son, who had a stroke, is doing better.  He lives out in [place].    Overall, her performance status is ECOG 1.    Medications:  Allergies as of 01/07/2019      Reactions   Codeine    REACTION: makes her nervous, orTylenol #3   Ibuprofen    REACTION: nervous   Meperidine Hcl    REACTION: nasuea and vomitting   Naproxen Sodium    REACTION: nervous      Medication List       Accurate as of January 07, 2019 11:47 AM. If you have any questions, ask your nurse or doctor.        alendronate 70 MG tablet Commonly known as: FOSAMAX Take 1 tablet (70 mg total) by mouth every 7 (seven) days. Take with a full glass of water on an empty stomach. Remain upright for at least 30 minutes after taking.   amLODipine 5 MG tablet Commonly known as: NORVASC Take 1 tablet (5 mg total) by mouth daily. TAKE 1 TABLET(5 MG) BY MOUTH DAILY   aspirin 81 MG chewable tablet Chew 1 tablet (81 mg total) by mouth 2 (two) times daily with a meal.   atenolol 100 MG tablet Commonly known as:  TENORMIN Take 1 tablet (100 mg total) by mouth daily.   atorvastatin 20 MG tablet Commonly known as: LIPITOR TAKE 1 TABLET(20 MG) BY MOUTH DAILY   azelastine 0.1 % nasal spray Commonly known as: ASTELIN Place 2 sprays into both nostrils at bedtime as needed for rhinitis. Use in each nostril as directed   cholecalciferol 1000 units tablet Commonly known as: VITAMIN D Take 1,000 Units by mouth daily.   clopidogrel 75 MG tablet Commonly known as: PLAVIX Take 1 tablet (75 mg total) by mouth daily.   clorazepate 7.5 MG tablet Commonly known as: TRANXENE Take 1 tablet (7.5 mg total) by mouth as needed.   fluticasone 50 MCG/ACT nasal spray Commonly known as: FLONASE SHAKE LIQUID AND USE 2 SPRAYS IN EACH NOSTRIL DAILY What changed: See the new instructions.   insulin NPH Human 100 UNIT/ML injection Commonly known as: NOVOLIN N Inject 38 units into the skin every morning and inject 20 units into the skin at bedtime.   insulin regular 100 units/mL injection Commonly known as: NOVOLIN R Inject 14 Units into the skin 2 (two) times daily before a meal.   INSULIN SYRINGE .5CC/31GX5/16" 31G X 5/16" 0.5 ML Misc USE TO INJECT INSULIN 5 TIMES  PER DAY   isosorbide mononitrate 30 MG 24 hr tablet Commonly known as: IMDUR TAKE 1 TABLET(30 MG) BY MOUTH DAILY What changed: See the new instructions.   mometasone 0.1 % cream Commonly known as: ELOCON Apply 1 application topically daily. Use as directed   multivitamin capsule Take 1 capsule by mouth daily.   nitroGLYCERIN 0.3 MG SL tablet Commonly known as: Nitrostat Place 1 tablet (0.3 mg total) under the tongue every 5 (five) minutes as needed for chest pain (ER if no better after 3 tablets).   omeprazole 20 MG capsule Commonly known as: PRILOSEC TAKE 1 CAPSULE(20 MG) BY MOUTH DAILY   ONE TOUCH ULTRA SYSTEM KIT w/Device Kit 1 kit by Does not apply route once.   OneTouch Ultra test strip Generic drug: glucose blood Use Onetouch  Ultra test strips as instructed to check blood sugar twice daily.   Victoza 18 MG/3ML Sopn Generic drug: liraglutide Inject 0.2 mLs (1.2 mg total) into the skin daily.       Allergies:  Allergies  Allergen Reactions  . Codeine     REACTION: makes her nervous, orTylenol #3  . Ibuprofen     REACTION: nervous  . Meperidine Hcl     REACTION: nasuea and vomitting  . Naproxen Sodium     REACTION: nervous    Past Medical History, Surgical history, Social history, and Family History were reviewed and updated.  Review of Systems: Review of Systems  Constitutional: Negative.   HENT: Negative.   Eyes: Negative.   Respiratory: Negative.   Cardiovascular: Negative.   Gastrointestinal: Negative.   Genitourinary: Negative.   Musculoskeletal: Negative.   Skin: Negative.   Neurological: Negative.   Endo/Heme/Allergies: Negative.   Psychiatric/Behavioral: Negative.      Physical Exam:  weight is 171 lb 12 oz (77.9 kg). Her temporal temperature is 97.3 F (36.3 C) (abnormal). Her blood pressure is 137/58 (abnormal) and her pulse is 67. Her respiration is 18 and oxygen saturation is 99%.   Wt Readings from Last 3 Encounters:  01/07/19 171 lb 12 oz (77.9 kg)  12/09/18 168 lb (76.2 kg)  10/01/18 161 lb 2 oz (73.1 kg)    Physical Exam Vitals reviewed.  HENT:     Head: Normocephalic and atraumatic.  Eyes:     Pupils: Pupils are equal, round, and reactive to light.  Cardiovascular:     Rate and Rhythm: Normal rate and regular rhythm.     Heart sounds: Normal heart sounds.  Pulmonary:     Effort: Pulmonary effort is normal.     Breath sounds: Normal breath sounds.  Abdominal:     General: Bowel sounds are normal.     Palpations: Abdomen is soft.  Musculoskeletal:        General: No tenderness or deformity. Normal range of motion.     Cervical back: Normal range of motion.  Lymphadenopathy:     Cervical: No cervical adenopathy.  Skin:    General: Skin is warm and dry.      Findings: No erythema or rash.  Neurological:     Mental Status: She is alert and oriented to person, place, and time.  Psychiatric:        Behavior: Behavior normal.        Thought Content: Thought content normal.        Judgment: Judgment normal.      Lab Results  Component Value Date   WBC 62.9 (HH) 01/07/2019   HGB 12.0 01/07/2019  HCT 38.7 01/07/2019   MCV 93.9 01/07/2019   PLT 222 01/07/2019   Lab Results  Component Value Date   FERRITIN 58 03/08/2017   IRON 69 03/08/2017   TIBC 308 03/08/2017   UIBC 239 03/08/2017   IRONPCTSAT 22 03/08/2017   Lab Results  Component Value Date   RBC 4.12 01/07/2019   Lab Results  Component Value Date   KAPLAMBRATIO 1.22 11/03/2016   Lab Results  Component Value Date   IGGSERUM 844 11/03/2016   IGA 281 02/25/2014   IGMSERUM 27 11/03/2016   Lab Results  Component Value Date   TOTALPROTELP 7.2 02/25/2014   ALBUMINELP 54.3 (L) 02/25/2014   A1GS 4.4 02/25/2014   A2GS 13.7 (H) 02/25/2014   BETS 7.0 02/25/2014   BETA2SER 6.2 02/25/2014   GAMS 14.4 02/25/2014   MSPIKE Not Observed 11/03/2016   SPEI * 02/25/2014     Chemistry      Component Value Date/Time   NA 143 01/07/2019 1029   NA 147 (H) 11/03/2016 1018   NA 143 08/30/2016 0954   K 4.0 01/07/2019 1029   K 3.9 11/03/2016 1018   K 4.5 08/30/2016 0954   CL 106 01/07/2019 1029   CL 110 (H) 11/03/2016 1018   CO2 29 01/07/2019 1029   CO2 27 11/03/2016 1018   CO2 24 08/30/2016 0954   BUN 22 01/07/2019 1029   BUN 25 (H) 11/03/2016 1018   BUN 20.5 08/30/2016 0954   CREATININE 1.31 (H) 01/07/2019 1029   CREATININE 1.6 (H) 11/03/2016 1018   CREATININE 1.4 (H) 08/30/2016 0954      Component Value Date/Time   CALCIUM 10.4 (H) 01/07/2019 1029   CALCIUM 10.1 11/03/2016 1018   CALCIUM 9.8 08/30/2016 0954   ALKPHOS 90 01/07/2019 1029   ALKPHOS 112 (H) 11/03/2016 1018   ALKPHOS 113 08/30/2016 0954   AST 17 01/07/2019 1029   AST 23 08/30/2016 0954   ALT 13  01/07/2019 1029   ALT 29 11/03/2016 1018   ALT 20 08/30/2016 0954   BILITOT 0.5 01/07/2019 1029   BILITOT 0.38 08/30/2016 0954      Impression and Plan: [name] is a very pleasant 79 yo caucasian female with CLL stage A.  So far, I see no issues with the CLL.  I just do not think this is going to be a problem for [place] in the near term.  I will plan to get her back in 6 months.  I think 6 months is reasonable.  I try to reassure her that her CLL is not a problem.  I am glad that she got through her surgery and had no problems from the CLL.   [name], MD 1/5/202111:47 AM

## 2019-01-07 NOTE — Telephone Encounter (Signed)
Appointments scheduled letter/calendar mailed per 1/5 los

## 2019-01-08 ENCOUNTER — Telehealth: Payer: Self-pay | Admitting: *Deleted

## 2019-01-08 ENCOUNTER — Encounter: Payer: Self-pay | Admitting: Hematology & Oncology

## 2019-01-08 DIAGNOSIS — K909 Intestinal malabsorption, unspecified: Secondary | ICD-10-CM | POA: Insufficient documentation

## 2019-01-08 HISTORY — DX: Intestinal malabsorption, unspecified: K90.9

## 2019-01-08 LAB — FERRITIN: Ferritin: 36 ng/mL (ref 11–307)

## 2019-01-08 LAB — IRON AND TIBC
Iron: 66 ug/dL (ref 41–142)
Saturation Ratios: 18 % — ABNORMAL LOW (ref 21–57)
TIBC: 361 ug/dL (ref 236–444)
UIBC: 295 ug/dL (ref 120–384)

## 2019-01-08 LAB — ERYTHROPOIETIN: Erythropoietin: 14.5 m[IU]/mL (ref 2.6–18.5)

## 2019-01-08 NOTE — Telephone Encounter (Signed)
As noted below by Dr. Marin Olp, I informed the patient of her iron level and she needs to get a dose of IV iron. She verbalized understanding.

## 2019-01-08 NOTE — Telephone Encounter (Signed)
-----   Message from Sara Napoleon, MD sent at 01/08/2019 10:54 AM EST ----- Call - the iron is low!!  She needs a dose of IV Iron.  Please set this up for next week!!  [name]

## 2019-01-09 ENCOUNTER — Telehealth: Payer: Self-pay | Admitting: Hematology & Oncology

## 2019-01-09 NOTE — Telephone Encounter (Signed)
Called and spoke with patient regarding appointments added per 1/6 sch msg

## 2019-01-16 ENCOUNTER — Other Ambulatory Visit: Payer: Self-pay

## 2019-01-16 ENCOUNTER — Inpatient Hospital Stay: Payer: Medicare Other

## 2019-01-16 VITALS — BP 162/66 | HR 69 | Temp 97.5°F | Resp 17

## 2019-01-16 DIAGNOSIS — D508 Other iron deficiency anemias: Secondary | ICD-10-CM

## 2019-01-16 DIAGNOSIS — Z7982 Long term (current) use of aspirin: Secondary | ICD-10-CM | POA: Diagnosis not present

## 2019-01-16 DIAGNOSIS — Z9181 History of falling: Secondary | ICD-10-CM | POA: Diagnosis not present

## 2019-01-16 DIAGNOSIS — C9110 Chronic lymphocytic leukemia of B-cell type not having achieved remission: Secondary | ICD-10-CM | POA: Diagnosis not present

## 2019-01-16 DIAGNOSIS — D509 Iron deficiency anemia, unspecified: Secondary | ICD-10-CM | POA: Diagnosis not present

## 2019-01-16 DIAGNOSIS — Z85038 Personal history of other malignant neoplasm of large intestine: Secondary | ICD-10-CM | POA: Diagnosis not present

## 2019-01-16 DIAGNOSIS — Z79899 Other long term (current) drug therapy: Secondary | ICD-10-CM | POA: Diagnosis not present

## 2019-01-16 MED ORDER — SODIUM CHLORIDE 0.9 % IV SOLN
Freq: Once | INTRAVENOUS | Status: AC
  Filled 2019-01-16: qty 250

## 2019-01-16 MED ORDER — SODIUM CHLORIDE 0.9 % IV SOLN
510.0000 mg | Freq: Once | INTRAVENOUS | Status: AC
  Administered 2019-01-16: 510 mg via INTRAVENOUS
  Filled 2019-01-16: qty 17

## 2019-01-16 NOTE — Patient Instructions (Signed)

## 2019-01-21 ENCOUNTER — Ambulatory Visit (INDEPENDENT_AMBULATORY_CARE_PROVIDER_SITE_OTHER): Payer: Medicare Other | Admitting: Cardiovascular Disease

## 2019-01-21 ENCOUNTER — Other Ambulatory Visit: Payer: Self-pay

## 2019-01-21 ENCOUNTER — Encounter: Payer: Self-pay | Admitting: Cardiovascular Disease

## 2019-01-21 DIAGNOSIS — E1159 Type 2 diabetes mellitus with other circulatory complications: Secondary | ICD-10-CM | POA: Diagnosis not present

## 2019-01-21 DIAGNOSIS — E785 Hyperlipidemia, unspecified: Secondary | ICD-10-CM | POA: Diagnosis not present

## 2019-01-21 DIAGNOSIS — Z951 Presence of aortocoronary bypass graft: Secondary | ICD-10-CM | POA: Diagnosis not present

## 2019-01-21 DIAGNOSIS — I10 Essential (primary) hypertension: Secondary | ICD-10-CM

## 2019-01-21 DIAGNOSIS — E1169 Type 2 diabetes mellitus with other specified complication: Secondary | ICD-10-CM

## 2019-01-21 NOTE — Assessment & Plan Note (Signed)
History of essential hypertension blood pressure measured today at 134/70 she is on amlodipine, atenolol.

## 2019-01-21 NOTE — Patient Instructions (Signed)
Medication Instructions:  Your physician recommends that you continue on your current medications as directed. Please refer to the Current Medication list given to you today.  If you need a refill on your cardiac medications before your next appointment, please call your pharmacy.   Lab work: NONE  Testing/Procedures: NONE  Follow-Up: At [facility], you and your health needs are our priority.  As part of our continuing mission to provide you with exceptional heart care, we have created designated Provider Care Teams.  These Care Teams include your primary Cardiologist (physician) and Advanced Practice Providers (APPs -  Physician Assistants and Nurse Practitioners) who all work together to provide you with the care you need, when you need it. You may see Quay Burow, MD or one of the following Advanced Practice Providers on your designated Care Team:    [name], PA-C  [place], [place]  Coletta Memos, [place]  Your physician wants you to follow-up in: 1 year with Dr. Gwenlyn Found

## 2019-01-21 NOTE — Assessment & Plan Note (Signed)
History of CAD status post myocardial infarction in 1997.  I performed cardiac catheterization at that time and placed an intra-aortic balloon pump.  She had CABG x5 by Dr. Darcey Nora subsequent to that and has done well since.  Her last cath performed by myself 09/16/2015 revealed patent grafts with normal LV function.  Her small nondominant circumflex was not bypassed and did have disease which I elected to treat medically.  She is remained stable since.

## 2019-01-21 NOTE — Progress Notes (Signed)
01/21/2019 [place]   1940-12-29  [id number]  Primary Physician Colon Branch, MD Primary Cardiologist: Lorretta Harp MD Lupe Carney, [place]  HPI:  [name] is a 79 y.o.  mildly overweight widowed Caucasian female mother of 2 living children (son at age 45 died of an overdose), grandmother of 5 grandchildren who was a patient of Dr. Terance Ice.  I ultimately assumed her care.  I last saw her in the office  06/14/2018. Her cardiac risk factor profile is walkover treated diabetes, hypertension and hyperlipidemia. She has a strong family history of heart disease with a father who died of a myocardial infarction at age 87 and her mother at age 21. She had a heart attack in 1997 at which time I performed cardiac catheterization. Apparently I placed a balloon pump at that time and sent her to open heart surgery. Dr. Tharon Aquas Trigt performed coronary artery bypass grafting x5. She had her last catheterization performed by Dr. Rollene Fare December 2008 revealing patent grafts with normal LV function. She had a negative Myoview back in 2012. Since I saw her years ago she's had several episodes of chest pain most recent one this past Sunday which was more intense and prolonged.She had a Myoview stress test performed 08/05/15 that showed new anterolateral ischemia. She's had several episodes of chest pain since I saw her back a month ago. She underwent outpatient cardiac catheterization 09/16/15 revealing patent grafts with an unbypassed nondominant circumflex are normal LV function. Medical therapy was recommended. She really has had no significant chest pain since her heart catheterization. She saw [name] in the office 04/19/16 with atypical chest pain, dyspnea or lower extremity edema. A 2-D echo was ordered and was normal. 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 She did get iron infusion by Dr.Ennover. Her symptoms have somewhat  improved.  Since I saw her a approximately 6 months ago she has been doing well.  She did fall and fractured her left hip and ribs 08/27/2018 had surgery for that.  She spent 3 subsequent weeks at [facility] doing rehab and has been at home since that time ambulating without difficulty.  She denies chest pain or shortness of breath.    Current Meds  Medication Sig  . alendronate (FOSAMAX) 70 MG tablet Take 1 tablet (70 mg total) by mouth every 7 (seven) days. Take with a full glass of water on an empty stomach. Remain upright for at least 30 minutes after taking.  Marland Kitchen amLODipine (NORVASC) 5 MG tablet Take 1 tablet (5 mg total) by mouth daily. TAKE 1 TABLET(5 MG) BY MOUTH DAILY  . aspirin 81 MG chewable tablet Chew 1 tablet (81 mg total) by mouth 2 (two) times daily with a meal.  . atenolol (TENORMIN) 100 MG tablet Take 1 tablet (100 mg total) by mouth daily.  Marland Kitchen atorvastatin (LIPITOR) 20 MG tablet TAKE 1 TABLET(20 MG) BY MOUTH DAILY  . azelastine (ASTELIN) 0.1 % nasal spray Place 2 sprays into both nostrils at bedtime as needed for rhinitis. Use in each nostril as directed  . Blood Glucose Monitoring Suppl (ONE TOUCH ULTRA SYSTEM KIT) W/DEVICE KIT 1 kit by Does not apply route once.  . cholecalciferol (VITAMIN D) 1000 UNITS tablet Take 1,000 Units by mouth daily.    . clopidogrel (PLAVIX) 75 MG tablet Take 1 tablet (75 mg total) by mouth daily.  . clorazepate (TRANXENE) 7.5 MG tablet  Take 1 tablet (7.5 mg total) by mouth as needed.  . fluticasone (FLONASE) 50 MCG/ACT nasal spray SHAKE LIQUID AND USE 2 SPRAYS IN EACH NOSTRIL DAILY (Patient taking differently: Place 2 sprays into both nostrils daily as needed for allergies. )  . glucose blood (ONETOUCH ULTRA) test strip Use Onetouch Ultra test strips as instructed to check blood sugar twice daily.  . insulin NPH Human (HUMULIN N,NOVOLIN N) 100 UNIT/ML injection Inject 38 units into the skin every morning and inject 20 units into the skin at bedtime.    . insulin regular (NOVOLIN R,HUMULIN R) 100 units/mL injection Inject 14 Units into the skin 2 (two) times daily before a meal.   . Insulin Syringe-Needle U-100 (INSULIN SYRINGE .5CC/31GX5/16") 31G X 5/16" 0.5 ML MISC USE TO INJECT INSULIN 5 TIMES PER DAY  . isosorbide mononitrate (IMDUR) 30 MG 24 hr tablet TAKE 1 TABLET(30 MG) BY MOUTH DAILY (Patient taking differently: Take 30 mg by mouth daily. TAKE 1 TABLET(30 MG) BY MOUTH DAILY)  . liraglutide (VICTOZA) 18 MG/3ML SOPN Inject 0.2 mLs (1.2 mg total) into the skin daily.  . mometasone (ELOCON) 0.1 % cream Apply 1 application topically daily. Use as directed  . Multiple Vitamin (MULTIVITAMIN) capsule Take 1 capsule by mouth daily.    . nitroGLYCERIN (NITROSTAT) 0.3 MG SL tablet Place 1 tablet (0.3 mg total) under the tongue every 5 (five) minutes as needed for chest pain (ER if no better after 3 tablets).  Marland Kitchen omeprazole (PRILOSEC) 20 MG capsule TAKE 1 CAPSULE(20 MG) BY MOUTH DAILY     Allergies  Allergen Reactions  . Codeine     REACTION: makes her nervous, orTylenol #3  . Ibuprofen     REACTION: nervous  . Meperidine Hcl     REACTION: nasuea and vomitting  . Naproxen Sodium     REACTION: nervous    Social History   Socioeconomic History  . Marital status: Widowed    Spouse name: Not on file  . Number of children: 2  . Years of education: Not on file  . Highest education level: Not on file  Occupational History  . Occupation: retired-- [employer]     Employer: RETIRED  Tobacco Use  . Smoking status: Never Smoker  . Smokeless tobacco: Never Used  Substance and Sexual Activity  . Alcohol use: No    Alcohol/week: 0.0 standard drinks  . Drug use: No  . Sexual activity: Not Currently  Other Topics Concern  . Not on file  Social History Narrative   Lives by herself, lost husband ~ 2004 , still drives    1 child in [place]   I child in [place]   Social Determinants of Health   Financial Resource Strain: Low Risk   . Difficulty  of Paying Living Expenses: Not hard at all  Food Insecurity: No Food Insecurity  . Worried About Charity fundraiser in the Last Year: Never true  . Ran Out of Food in the Last Year: Never true  Transportation Needs: No Transportation Needs  . Lack of Transportation (Medical): No  . Lack of Transportation (Non-Medical): No  Physical Activity: Inactive  . Days of Exercise per Week: 0 days  . Minutes of Exercise per Session: 0 min  Stress: No Stress Concern Present  . Feeling of Stress : Not at all  Social Connections: Unknown  . Frequency of Communication with Friends and Family: More than three times a week  . Frequency of Social Gatherings with Friends and  Family: More than three times a week  . Attends Religious Services: More than 4 times per year  . Active Member of Clubs or Organizations: Patient refused  . Attends Archivist Meetings: Patient refused  . Marital Status: Patient refused  Intimate Partner Violence: Not At Risk  . Fear of Current or Ex-Partner: No  . Emotionally Abused: No  . Physically Abused: No  . Sexually Abused: No     Review of Systems: General: negative for chills, fever, night sweats or weight changes.  Cardiovascular: negative for chest pain, dyspnea on exertion, edema, orthopnea, palpitations, paroxysmal nocturnal dyspnea or shortness of breath Dermatological: negative for rash Respiratory: negative for cough or wheezing Urologic: negative for hematuria Abdominal: negative for nausea, vomiting, diarrhea, bright red blood per rectum, melena, or hematemesis Neurologic: negative for visual changes, syncope, or dizziness All other systems reviewed and are otherwise negative except as noted above.    Blood pressure 134/70, pulse 73, temperature (!) 96.6 F (35.9 C), height _0  (1.626 m), weight 175 lb (79.4 kg).  General appearance: alert and no distress Neck: no adenopathy, no carotid bruit, no JVD, supple, symmetrical, trachea midline and  thyroid not enlarged, symmetric, no tenderness/mass/nodules Lungs: clear to auscultation bilaterally Heart: regular rate and rhythm, S1, S2 normal, no murmur, click, rub or gallop Extremities: extremities normal, atraumatic, no cyanosis or edema Pulses: 2+ and symmetric Skin: Skin color, texture, turgor normal. No rashes or lesions Neurologic: Alert and oriented X 3, normal strength and tone. Normal symmetric reflexes. Normal coordination and gait  EKG not performed today  ASSESSMENT AND PLAN:   Hyperlipidemia associated with type 2 diabetes mellitus ([place]) History of hyperlipidemia on statin therapy with lipid profile performed 11/25/2018 revealing a total cholesterol 116, LDL 49 and HDL of 36.  Hypertension associated with diabetes ([place]) History of essential hypertension blood pressure measured today at 134/70 she is on amlodipine, atenolol.  Hx of CABG History of CAD status post myocardial infarction in 1997.  I performed cardiac catheterization at that time and placed an intra-aortic balloon pump.  She had CABG x5 by Dr. Darcey Nora subsequent to that and has done well since.  Her last cath performed by myself 09/16/2015 revealed patent grafts with normal LV function.  Her small nondominant circumflex was not bypassed and did have disease which I elected to treat medically.  She is remained stable since.      Lorretta Harp MD FACP,FACC,FAHA, [facility] 01/21/2019 12:06 PM

## 2019-01-21 NOTE — Assessment & Plan Note (Signed)
History of hyperlipidemia on statin therapy with lipid profile performed 11/25/2018 revealing a total cholesterol 116, LDL 49 and HDL of 36.

## 2019-01-29 ENCOUNTER — Ambulatory Visit: Payer: Medicare Other

## 2019-02-06 ENCOUNTER — Ambulatory Visit: Payer: Medicare Other | Attending: Internal Medicine

## 2019-02-06 DIAGNOSIS — Z23 Encounter for immunization: Secondary | ICD-10-CM | POA: Insufficient documentation

## 2019-02-06 NOTE — Progress Notes (Signed)
   Covid-19 Vaccination Clinic  Name:  [name]    MRN: [id number] DOB: Jan 05, 1940  02/06/2019  [name] was observed post Covid-19 immunization for 15 minutes without incidence. She was provided with Vaccine Information Sheet and instruction to access the V-Safe system.   [name] was instructed to call 911 with any severe reactions post vaccine: Marland Kitchen Difficulty breathing  . Swelling of your face and throat  . A fast heartbeat  . A bad rash all over your body  . Dizziness and weakness    Immunizations Administered    Name Date Dose VIS Date Route   Pfizer COVID-19 Vaccine 02/06/2019 11:23 AM 0.3 mL 12/13/2018 Intramuscular   Manufacturer: [place]   Lot: [id number]   [place]: [id number]

## 2019-03-03 ENCOUNTER — Ambulatory Visit: Payer: Medicare Other | Attending: Internal Medicine

## 2019-03-03 ENCOUNTER — Other Ambulatory Visit: Payer: Self-pay

## 2019-03-03 DIAGNOSIS — Z23 Encounter for immunization: Secondary | ICD-10-CM | POA: Insufficient documentation

## 2019-03-03 NOTE — Progress Notes (Signed)
   Covid-19 Vaccination Clinic  Name:  [name]    MRN: [id number] DOB: 12-02-1940  03/03/2019  Ms. Manter was observed post Covid-19 immunization for 15 minutes without incidence. She was provided with Vaccine Information Sheet and instruction to access the V-Safe system.   Ms. Akard was instructed to call 911 with any severe reactions post vaccine: Marland Kitchen Difficulty breathing  . Swelling of your face and throat  . A fast heartbeat  . A bad rash all over your body  . Dizziness and weakness    Immunizations Administered    Name Date Dose VIS Date Route   Pfizer COVID-19 Vaccine 03/03/2019  3:10 PM 0.3 mL 12/13/2018 Intramuscular   Manufacturer: [place]   Lot: [id number]   [place]: [id number]

## 2019-03-06 ENCOUNTER — Other Ambulatory Visit: Payer: Medicare Other

## 2019-03-06 ENCOUNTER — Other Ambulatory Visit (INDEPENDENT_AMBULATORY_CARE_PROVIDER_SITE_OTHER): Payer: Medicare Other

## 2019-03-06 ENCOUNTER — Other Ambulatory Visit: Payer: Self-pay

## 2019-03-06 DIAGNOSIS — Z794 Long term (current) use of insulin: Secondary | ICD-10-CM

## 2019-03-06 DIAGNOSIS — E1165 Type 2 diabetes mellitus with hyperglycemia: Secondary | ICD-10-CM | POA: Diagnosis not present

## 2019-03-06 LAB — BASIC METABOLIC PANEL
BUN: 19 mg/dL (ref 6–23)
CO2: 27 mEq/L (ref 19–32)
Calcium: 9.2 mg/dL (ref 8.4–10.5)
Chloride: 105 mEq/L (ref 96–112)
Creatinine, Ser: 1.46 mg/dL — ABNORMAL HIGH (ref 0.40–1.20)
GFR: 34.51 mL/min — ABNORMAL LOW (ref >60.00)
Glucose, Bld: 156 mg/dL — ABNORMAL HIGH (ref 70–99)
Potassium: 3.9 mEq/L (ref 3.5–5.1)
Sodium: 140 mEq/L (ref 135–145)

## 2019-03-06 LAB — HEMOGLOBIN A1C: Hgb A1c MFr Bld: 6.5 % (ref 4.6–6.5)

## 2019-03-07 ENCOUNTER — Other Ambulatory Visit: Payer: Self-pay | Admitting: Endocrinology

## 2019-03-10 DIAGNOSIS — M7062 Trochanteric bursitis, left hip: Secondary | ICD-10-CM | POA: Diagnosis not present

## 2019-03-10 DIAGNOSIS — S72142D Displaced intertrochanteric fracture of left femur, subsequent encounter for closed fracture with routine healing: Secondary | ICD-10-CM | POA: Diagnosis not present

## 2019-03-11 ENCOUNTER — Other Ambulatory Visit: Payer: Self-pay

## 2019-03-13 ENCOUNTER — Encounter: Payer: Self-pay | Admitting: Endocrinology

## 2019-03-13 ENCOUNTER — Ambulatory Visit (INDEPENDENT_AMBULATORY_CARE_PROVIDER_SITE_OTHER): Payer: Medicare Other | Admitting: Endocrinology

## 2019-03-13 ENCOUNTER — Other Ambulatory Visit: Payer: Self-pay

## 2019-03-13 VITALS — BP 140/82 | HR 90 | Ht 64.0 in | Wt 177.6 lb

## 2019-03-13 DIAGNOSIS — Z794 Long term (current) use of insulin: Secondary | ICD-10-CM | POA: Diagnosis not present

## 2019-03-13 DIAGNOSIS — E1165 Type 2 diabetes mellitus with hyperglycemia: Secondary | ICD-10-CM | POA: Diagnosis not present

## 2019-03-13 DIAGNOSIS — N289 Disorder of kidney and ureter, unspecified: Secondary | ICD-10-CM

## 2019-03-13 DIAGNOSIS — I10 Essential (primary) hypertension: Secondary | ICD-10-CM | POA: Diagnosis not present

## 2019-03-13 NOTE — Patient Instructions (Addendum)
TAKE R insulin in afternoon if eating a full meal  Keep raisins or glucose tabs for lo sugars, trat with juice not food

## 2019-03-13 NOTE — Progress Notes (Signed)
Patient ID: [name], female   DOB: 04-23-1940, 78 y.o.   MRN: [id number]            Reason for Appointment: Followup for Type 2 Diabetes   History of Present Illness:          Diagnosis: Type 2 diabetes mellitus, date of diagnosis: 1998       Past history:   She was treated with metformin at diagnosis when this had been continued until 2015 Subsequently Amaryl was also added several years ago and this has been continued Her blood sugars were under fair control between 2010 and early 2013 with A1c ranging from 7.4-9.4, mostly under 8% However since 08/2011 her A1c has been mostly over 8%  Insulin was added in 2014 with sma limited bottom ll doses of Lantus and this has been progressively increased She was started on mealtime insulin on her initial consultation in 9/15 because of high postprandial readings, sometimes over 300 With adding Victoza in 02/2014 her blood sugars were somewhat better with A1c coming down below 8%  Recent history:   INSULIN regimen: Regular 14 units before meals twice daily.  NPH 38 units in the morning and 20 hs  Non-insulin hypoglycemic drugs the patient is taking are: Victoza 1.2 mg daily. Metformin 500 in the morning and 1000 in the evening  A1c has been consistently under 7 and now 6.5   Current blood sugar patterns, daily management and problems identified:   Her A1c is fairly good although lower than expected for her home readings  Weight is starting to go back up, previously had lost weight  She has fluctuation in her blood sugars at night as discussed below  However checking blood sugars mostly late morning and bedtime and not clear which readings are before or after meals and how long after  Hypoglycemia: She has had only rare hypoglycemia, recently when she was more active outside she felt a little weak but did not check her sugar and had an early lunch.  Otherwise she will sometimes have a peppermint candy when she is not at home.   Does not carry any glucose tablets  Fasting blood sugars: Blood sugars are relatively better in the morning although not clear which readings are before or after breakfast Lab glucose was after breakfast   Postprandial readings:  She has checked blood sugars mostly at bedtime Her mealtimes are extremely variable and sometimes he will have a large meal in the late afternoon instead of evening Last month she had a few readings consistently over 200 and she did not think she changed her diet On an average blood sugars are fairly good but again cannot explain why she has readings as high as 294 late evening  Insulin regimen:  She takes her insulin usually well before eating  She takes 14 units and does not seem to adjusted based on her meal size and carbohydrate intake  Continues to use insulin syringes for injection  VICTOZA: She has continued to take 1.2 mg daily without any side effects      Dinner is usually at 6-7 pm  Glucose monitoring:  done 1-2 times a day         Glucometer: One Touch.       Blood Glucose readings by time of day from download for the last 2 weeks:   PRE-MEAL  mornings  midday Dinner Bedtime Overall  Glucose range:  107-125  117-172   127-294   Mean/median:  131  172  150     Previous readings:  PRE-MEAL Fasting Lunch Dinner Bedtime Overall  Glucose range: 83-131      Mean/median:  107    126   POST-MEAL PC Breakfast PC Lunch PC Dinner  Glucose range:   68-299  Mean/median:   151     Self-care: The diet that the patient has been following is: none, usually eating low fat meals Meals: 2-3 meals per day          Dietician visit, most recent: never.  She saw the CDE in 02/2014               Weight history:  Wt Readings from Last 3 Encounters:  03/13/19 177 lb 9.6 oz (80.6 kg)  01/21/19 175 lb (79.4 kg)  01/07/19 171 lb 12 oz (77.9 kg)    Glycemic control:   Lab Results  Component Value Date   HGBA1C 6.5 03/06/2019   HGBA1C 6.0  11/25/2018   HGBA1C 6.1 (H) 08/27/2018   Lab Results  Component Value Date   MICROALBUR 17.7 (H) 12/09/2018   LDLCALC 49 11/25/2018   CREATININE 1.46 (H) 03/06/2019     OTHER active problems  discussed in review of systems   Allergies as of 03/13/2019      Reactions   Codeine    REACTION: makes her nervous, orTylenol #3   Ibuprofen    REACTION: nervous   Meperidine Hcl    REACTION: nasuea and vomitting   Naproxen Sodium    REACTION: nervous      Medication List       Accurate as of March 13, 2019  1:30 PM. If you have any questions, ask your nurse or doctor.        alendronate 70 MG tablet Commonly known as: FOSAMAX Take 1 tablet (70 mg total) by mouth every 7 (seven) days. Take with a full glass of water on an empty stomach. Remain upright for at least 30 minutes after taking.   amLODipine 5 MG tablet Commonly known as: NORVASC TAKE 1 TABLET(5 MG) BY MOUTH DAILY   aspirin 81 MG chewable tablet Chew 1 tablet (81 mg total) by mouth 2 (two) times daily with a meal.   atenolol 100 MG tablet Commonly known as: TENORMIN Take 1 tablet (100 mg total) by mouth daily.   atorvastatin 20 MG tablet Commonly known as: LIPITOR TAKE 1 TABLET(20 MG) BY MOUTH DAILY   azelastine 0.1 % nasal spray Commonly known as: ASTELIN Place 2 sprays into both nostrils at bedtime as needed for rhinitis. Use in each nostril as directed   cholecalciferol 1000 units tablet Commonly known as: VITAMIN D Take 1,000 Units by mouth daily.   clopidogrel 75 MG tablet Commonly known as: PLAVIX Take 1 tablet (75 mg total) by mouth daily.   clorazepate 7.5 MG tablet Commonly known as: TRANXENE Take 1 tablet (7.5 mg total) by mouth as needed.   fluticasone 50 MCG/ACT nasal spray Commonly known as: FLONASE SHAKE LIQUID AND USE 2 SPRAYS IN EACH NOSTRIL DAILY What changed: See the new instructions.   insulin NPH Human 100 UNIT/ML injection Commonly known as: NOVOLIN N Inject 38 units into  the skin every morning and inject 20 units into the skin at bedtime.   insulin regular 100 units/mL injection Commonly known as: NOVOLIN R Inject 14 Units into the skin 2 (two) times daily before a meal.   INSULIN SYRINGE .5CC/31GX5/16" 31G X 5/16" 0.5 ML Misc USE TO INJECT INSULIN  5 TIMES PER DAY   isosorbide mononitrate 30 MG 24 hr tablet Commonly known as: IMDUR TAKE 1 TABLET(30 MG) BY MOUTH DAILY What changed: See the new instructions.   mometasone 0.1 % cream Commonly known as: ELOCON Apply 1 application topically daily. Use as directed   multivitamin capsule Take 1 capsule by mouth daily.   nitroGLYCERIN 0.3 MG SL tablet Commonly known as: Nitrostat Place 1 tablet (0.3 mg total) under the tongue every 5 (five) minutes as needed for chest pain (ER if no better after 3 tablets).   omeprazole 20 MG capsule Commonly known as: PRILOSEC TAKE 1 CAPSULE(20 MG) BY MOUTH DAILY   ONE TOUCH ULTRA SYSTEM KIT w/Device Kit 1 kit by Does not apply route once.   OneTouch Ultra test strip Generic drug: glucose blood Use Onetouch Ultra test strips as instructed to check blood sugar twice daily.   Victoza 18 MG/3ML Sopn Generic drug: liraglutide Inject 0.2 mLs (1.2 mg total) into the skin daily.       Allergies:  Allergies  Allergen Reactions  . Codeine     REACTION: makes her nervous, orTylenol #3  . Ibuprofen     REACTION: nervous  . Meperidine Hcl     REACTION: nasuea and vomitting  . Naproxen Sodium     REACTION: nervous    Past Medical History:  Diagnosis Date  . Acute blood loss anemia 09/17/2015  . Allergy   . Anemia   . Anginal pain ([place]) 2017  . Anxiety   . Arthritis    "hands and knees mostly" (09/16/2015)  . B12 deficiency anemia   . CAD (coronary artery disease)    Dr Gwenlyn Found  . Cataract    bil cataracts removed  . 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 Clotting disorder ([employer])   . Depression   . DJD (degenerative joint disease)   .  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 Gastritis   . GERD (gastroesophageal reflux disease)   . Headache(784.0)   . History of blood transfusion    "I"ve had 20 some; thru birth of children, loss of blood, last 2 were in ~ 1999 before my cancer surgery" (09/16/2015)  . History of hiatal hernia   . History of shingles   . Hx of colonic polyps   . Hyperlipidemia   . Hypertension   . 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 Myocardial infarction ([place])    1997  . Pneumonia    X 1  . Postoperative hematoma involving circulatory system following cardiac catheterization 09/17/2015  . S/P cardiac cath 09/16/15 09/17/2015  . Type II diabetes mellitus ([place])   . 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 Venous insufficiency     Past Surgical History:  Procedure Laterality Date  . CARDIAC CATHETERIZATION  1997; 2008; 09/16/2015  . CARDIAC CATHETERIZATION N/A 09/16/2015   Procedure: Right/Left Heart Cath and Coronary/Graft Angiography;  Surgeon: Lorretta Harp, MD;  Location: [facility];  Service: Cardiovascular;  Laterality: N/A;  . CESAREAN SECTION  1966  . COLONOSCOPY    . CORONARY ARTERY BYPASS GRAFT  1997   CABG X5  . EYE SURGERY     2 laser surgeries on left eye with implant  . EYE SURGERY Bilateral    cataracts  . FEMUR IM NAIL Left 08/28/2018   Procedure: INTRAMEDULLARY (IM) NAIL FEMORAL;  Surgeon: Rod Can, MD;  Location: [facility];  Service: Orthopedics;  Laterality: Left;  . FRACTURE SURGERY    . HERNIA REPAIR    . LAPAROSCOPIC ASSISTED VENTRAL HERNIA REPAIR  09/2008    with incarcerated colon;  Dr. Lucia Gaskins  . MOHS SURGERY  06/2016   BCC  . PATELLA FRACTURE SURGERY Left 11/1994   "crushed my knee"; put in a plate & 6 screws"  . POLYPECTOMY    . REFRACTIVE SURGERY Left 07/30/2015  . 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 March Rummage  . SHOULDER ARTHROSCOPY W/ ROTATOR CUFF REPAIR Right  1997  . SMALL INTESTINE SURGERY    . TONSILLECTOMY  1960s  . TOTAL KNEE ARTHROPLASTY WITH HARDWARE REMOVAL Left 12/1994    (infex, hardware removed )  . TUBAL LIGATION  1966    Family History  Problem Relation Age of Onset  . Heart disease Mother 57  . Heart disease Father 18       MI  . Hypertension Child   . Heart attack Child   . Diabetes Child   . Heart disease Maternal Aunt        x 2, all deceased  . Heart disease Maternal Uncle        x 4, all deceased  . Emphysema Brother 36  . Colon cancer Neg Hx   . Breast cancer Neg Hx   . Rectal cancer Neg Hx   . Stomach cancer Neg Hx     Social History:  reports that she has never smoked. She has never used smokeless tobacco. She reports that she does not drink alcohol or use drugs.    Review of Systems         Lipids: Has had excellent control of hypercholesterolemia with long-term use of Lipitor She has a history of coronary bypass surgery. 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    Lab Results  Component Value Date   CREATININE 1.46 (H) 03/06/2019   CREATININE 1.31 (H) 01/07/2019   CREATININE 1.22 (H) 11/25/2018       No history of Numbness, tingling or burning in feet   Diabetic foot exam was in 5/19 showing mild neuropathy  History of CLL: Under the care of hematologist, no recent anemia  Lab Results  Component Value Date   WBC 62.9 ([place]) 01/07/2019   Lab Results  Component  Value Date   HGB 12.0 01/07/2019       Physical Examination:  BP 140/82 (BP Location: Left Arm, Patient Position: Sitting, Cuff Size: Normal)   Pulse 90   Ht 5' 4"  (1.626 m)   Wt 177 lb 9.6 oz (80.6 kg)   SpO2 97%   BMI 30.48 kg/m     Diabetes type 2, insulin requiring with BMI 29  See history of present illness for detailed discussion of her current blood sugar patterns, management and problems identified  Her A1c is fairly stable at 6.5  Blood sugars are as high as 294  Again not clear if A1c is accurate since blood sugars are averaging 150 and periodically well over 200 She has irregular mealtimes and now does not appear to be covering her afternoon meal if she has this in place of dinner This may cause higher readings later in the evening sometimes She may have tendency to hypoglycemia if she is more active and she did not adjust her insulin in anticipation Also not treating hypoglycemia appropriately at times Fasting readings are generally fairly good She is able to consistently continue her Victoza  RENAL insufficiency: Her creatinine is slightly higher She thinks that she can do better with fluid intake Also likely has variability in her renal blood flow  HYPERTENSION: Blood pressure is well controlled     PLAN:   She will need to cover her main meal in the afternoon or evening with regular insulin since she has variable eating times  Discussed that if she is eating only a small snack late in the evening instead of dinner she will not take her full dose of regular insulin  Also may increase her dose by 2 to 4 units of eating more carbohydrates or dessert  Since morning sugars are fairly good she can continue the same dose of NPH at bedtime  Again reminded her to take consistent dose of Victoza every day  Discussed appropriate treatment of hypoglycemia with simple sugars instead of food and she can carry the glucose tablets, glucose gel, raisins or  peppermint candy with her when not at home  To check more readings 2 hours after meals even if she is eating a large meal in the afternoon  When she is planning to be active after breakfast she can reduce her regular insulin to 10 units  To call if she has more fluctuation in her blood sugars  We will also check fructosamine on the next visit to correlate with A1c  There are no Patient Instructions on file for this visit.   Elayne Snare 03/13/2019, 1:30 PM   Note: This office note was prepared with Dragon voice recognition system technology. Any transcriptional errors that result from this process are unintentional.

## 2019-03-21 DIAGNOSIS — M7062 Trochanteric bursitis, left hip: Secondary | ICD-10-CM | POA: Diagnosis not present

## 2019-04-01 DIAGNOSIS — M7062 Trochanteric bursitis, left hip: Secondary | ICD-10-CM | POA: Diagnosis not present

## 2019-04-03 DIAGNOSIS — M7062 Trochanteric bursitis, left hip: Secondary | ICD-10-CM | POA: Diagnosis not present

## 2019-04-07 DIAGNOSIS — M7062 Trochanteric bursitis, left hip: Secondary | ICD-10-CM | POA: Diagnosis not present

## 2019-04-10 ENCOUNTER — Other Ambulatory Visit: Payer: Self-pay | Admitting: Endocrinology

## 2019-04-10 DIAGNOSIS — M7062 Trochanteric bursitis, left hip: Secondary | ICD-10-CM | POA: Diagnosis not present

## 2019-04-15 DIAGNOSIS — M7062 Trochanteric bursitis, left hip: Secondary | ICD-10-CM | POA: Diagnosis not present

## 2019-04-17 DIAGNOSIS — M7062 Trochanteric bursitis, left hip: Secondary | ICD-10-CM | POA: Diagnosis not present

## 2019-04-18 ENCOUNTER — Other Ambulatory Visit: Payer: Self-pay

## 2019-04-18 MED ORDER — ONETOUCH ULTRA VI STRP: ORAL_STRIP | 2 refills | Status: DC

## 2019-04-29 DIAGNOSIS — M7062 Trochanteric bursitis, left hip: Secondary | ICD-10-CM | POA: Diagnosis not present

## 2019-05-01 DIAGNOSIS — M7062 Trochanteric bursitis, left hip: Secondary | ICD-10-CM | POA: Diagnosis not present

## 2019-05-02 ENCOUNTER — Ambulatory Visit (HOSPITAL_BASED_OUTPATIENT_CLINIC_OR_DEPARTMENT_OTHER)
Admission: RE | Admit: 2019-05-02 | Discharge: 2019-05-02 | Disposition: A | Discharge status: Home / Self Care | Payer: Medicare Other | Source: Ambulatory Visit | Attending: Internal Medicine | Admitting: Internal Medicine

## 2019-05-02 ENCOUNTER — Ambulatory Visit (INDEPENDENT_AMBULATORY_CARE_PROVIDER_SITE_OTHER): Payer: Medicare Other | Admitting: Internal Medicine

## 2019-05-02 ENCOUNTER — Encounter: Payer: Self-pay | Admitting: Internal Medicine

## 2019-05-02 ENCOUNTER — Other Ambulatory Visit: Payer: Self-pay

## 2019-05-02 ENCOUNTER — Telehealth: Payer: Self-pay | Admitting: *Deleted

## 2019-05-02 VITALS — BP 158/56 | HR 69 | Temp 97.3°F | Resp 18 | Ht 64.0 in | Wt 186.0 lb

## 2019-05-02 DIAGNOSIS — M159 Polyosteoarthritis, unspecified: Secondary | ICD-10-CM

## 2019-05-02 DIAGNOSIS — R0602 Shortness of breath: Secondary | ICD-10-CM

## 2019-05-02 DIAGNOSIS — Z79899 Other long term (current) drug therapy: Secondary | ICD-10-CM

## 2019-05-02 DIAGNOSIS — M8949 Other hypertrophic osteoarthropathy, multiple sites: Secondary | ICD-10-CM | POA: Diagnosis not present

## 2019-05-02 DIAGNOSIS — I25810 Atherosclerosis of coronary artery bypass graft(s) without angina pectoris: Secondary | ICD-10-CM | POA: Diagnosis not present

## 2019-05-02 LAB — CBC WITH DIFFERENTIAL/PLATELET
Basophils Absolute: 0 10*3/uL (ref 0.0–0.1)
Basophils Relative: 0.1 % (ref 0.0–3.0)
Eosinophils Absolute: 0.3 10*3/uL (ref 0.0–0.7)
Eosinophils Relative: 0.5 % (ref 0.0–5.0)
HCT: 37.4 % (ref 36.0–46.0)
Hemoglobin: 11.9 g/dL — ABNORMAL LOW (ref 12.0–15.0)
Lymphocytes Relative: 91 % — ABNORMAL HIGH (ref 12.0–46.0)
Lymphs Abs: 56.3 10*3/uL — ABNORMAL HIGH (ref 0.7–4.0)
MCHC: 31.8 g/dL (ref 30.0–36.0)
MCV: 97.7 fl (ref 78.0–100.0)
Monocytes Absolute: 0.4 10*3/uL (ref 0.1–1.0)
Monocytes Relative: 0.6 % — ABNORMAL LOW (ref 3.0–12.0)
Neutro Abs: 4.8 10*3/uL (ref 1.4–7.7)
Neutrophils Relative %: 7.8 % — ABNORMAL LOW (ref 43.0–77.0)
Platelets: 192 10*3/uL (ref 150.0–400.0)
RBC: 3.83 Mil/uL — ABNORMAL LOW (ref 3.87–5.11)
RDW: 14.4 % (ref 11.5–15.5)
WBC: 62.3 10*3/uL (ref 4.0–10.5)

## 2019-05-02 LAB — BASIC METABOLIC PANEL
BUN: 22 mg/dL (ref 6–23)
CO2: 26 mEq/L (ref 19–32)
Calcium: 9 mg/dL (ref 8.4–10.5)
Chloride: 109 mEq/L (ref 96–112)
Creatinine, Ser: 1.36 mg/dL — ABNORMAL HIGH (ref 0.40–1.20)
GFR: 37.44 mL/min — ABNORMAL LOW (ref >60.00)
Glucose, Bld: 101 mg/dL — ABNORMAL HIGH (ref 70–99)
Potassium: 4.2 mEq/L (ref 3.5–5.1)
Sodium: 143 mEq/L (ref 135–145)

## 2019-05-02 LAB — BRAIN NATRIURETIC PEPTIDE: Pro B Natriuretic peptide (BNP): 281 pg/mL — ABNORMAL HIGH (ref 0.0–100.0)

## 2019-05-02 NOTE — Progress Notes (Signed)
Subjective:    Patient ID: [name], female    DOB: Jan 13, 1940, 79 y.o.   MRN: [id number]  DOS:  05/02/2019 Type of visit - description: Follow-up Routine visit, her main concern however is DOE: Used to be able to do all her ADLs without problems, now for the last 4 weeks is getting short of breath with routine activities. Is not just tiredness, she reports difficulty breathing. Denies any cough, sweats or nausea associated with exertion.  Wt Readings from Last 3 Encounters:  05/02/19 186 lb (84.4 kg)  03/13/19 177 lb 9.6 oz (80.6 kg)  01/21/19 175 lb (79.4 kg)    Review of Systems No fever chills No sinus congestion No chest pain.  Some lower extremity edema No nausea, vomiting or blood in the stools Admits to dry cough, for a while, no wheezing or chest congestion.  Past Medical History:  Diagnosis Date  . Acute blood loss anemia 09/17/2015  . Allergy   . Anemia   . Anginal pain ([place]) 2017  . Anxiety   . Arthritis    "hands and knees mostly" (09/16/2015)  . B12 deficiency anemia   . CAD (coronary artery disease)    Dr Gwenlyn Found  . Cataract    bil cataracts removed  . CLL (chronic lymphocytic leukemia) ([place]) 02/25/2014  . Clotting disorder ([place])   . Depression   . DJD (degenerative joint disease)   . 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 Gastritis   . GERD (gastroesophageal reflux disease)   . Headache(784.0)   . History of blood transfusion    "I"ve had 20 some; thru birth of children, loss of blood, last 2 were in ~ 1999 before my cancer surgery" (09/16/2015)  . History of hiatal hernia   . History of shingles   . Hx of colonic polyps   . Hyperlipidemia   . Hypertension   . 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 Myocardial infarction ([place])    1997  . Pneumonia    X 1  . Postoperative hematoma involving circulatory system following cardiac catheterization 09/17/2015  . S/P cardiac cath 09/16/15  09/17/2015  . Type II diabetes mellitus ([place])   . 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[facility])    as a child  . Venous insufficiency     Past Surgical History:  Procedure Laterality Date  . CARDIAC CATHETERIZATION  1997; 2008; 09/16/2015  . CARDIAC CATHETERIZATION N/A 09/16/2015   Procedure: Right/Left Heart Cath and Coronary/Graft Angiography;  Surgeon: Lorretta Harp, MD;  Location: [facility];  Service: Cardiovascular;  Laterality: N/A;  . CESAREAN SECTION  1966  . COLONOSCOPY    . CORONARY ARTERY BYPASS GRAFT  1997   CABG X5  . EYE SURGERY     2 laser surgeries on left eye with implant  . EYE SURGERY Bilateral    cataracts  . FEMUR IM NAIL Left 08/28/2018   Procedure: INTRAMEDULLARY (IM) NAIL FEMORAL;  Surgeon: Rod Can, MD;  [facility];  Service: Orthopedics;  Laterality: Left;  . FRACTURE SURGERY    . HERNIA REPAIR    . LAPAROSCOPIC ASSISTED VENTRAL HERNIA REPAIR  09/2008    with incarcerated colon;  Dr. Lucia Gaskins  . MOHS SURGERY  06/2016   BCC  . PATELLA FRACTURE SURGERY Left 11/1994   "crushed my knee"; put in a plate & 6 screws"  . POLYPECTOMY    . REFRACTIVE SURGERY Left 07/30/2015  .  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 March Rummage  . SHOULDER ARTHROSCOPY W/ ROTATOR CUFF REPAIR Right 1997  . SMALL INTESTINE SURGERY    . TONSILLECTOMY  1960s  . TOTAL KNEE ARTHROPLASTY WITH HARDWARE REMOVAL Left 12/1994    (infex, hardware removed )  . TUBAL LIGATION  1966    Allergies as of 05/02/2019      Reactions   Codeine    REACTION: makes her nervous, orTylenol #3   Ibuprofen    REACTION: nervous   Meperidine Hcl    REACTION: nasuea and vomitting   Naproxen Sodium    REACTION: nervous      Medication List       Accurate as of May 02, 2019 11:59 PM. If you have any questions, ask your nurse or doctor.        alendronate 70 MG tablet Commonly known as: FOSAMAX Take 1 tablet (70 mg total) by mouth every 7 (seven) days. Take with a  full glass of water on an empty stomach. Remain upright for at least 30 minutes after taking.   amLODipine 5 MG tablet Commonly known as: NORVASC TAKE 1 TABLET(5 MG) BY MOUTH DAILY   aspirin 81 MG chewable tablet Chew 1 tablet (81 mg total) by mouth 2 (two) times daily with a meal.   atenolol 100 MG tablet Commonly known as: TENORMIN Take 1 tablet (100 mg total) by mouth daily.   atorvastatin 20 MG tablet Commonly known as: LIPITOR TAKE 1 TABLET(20 MG) BY MOUTH DAILY   azelastine 0.1 % nasal spray Commonly known as: ASTELIN Place 2 sprays into both nostrils at bedtime as needed for rhinitis. Use in each nostril as directed   cholecalciferol 1000 units tablet Commonly known as: VITAMIN D Take 1,000 Units by mouth daily.   clopidogrel 75 MG tablet Commonly known as: PLAVIX Take 1 tablet (75 mg total) by mouth daily.   clorazepate 7.5 MG tablet Commonly known as: TRANXENE Take 1 tablet (7.5 mg total) by mouth as needed.   fluticasone 50 MCG/ACT nasal spray Commonly known as: FLONASE SHAKE LIQUID AND USE 2 SPRAYS IN EACH NOSTRIL DAILY What changed: See the new instructions.   insulin NPH Human 100 UNIT/ML injection Commonly known as: NOVOLIN N Inject 38 units into the skin every morning and inject 20 units into the skin at bedtime.   insulin regular 100 units/mL injection Commonly known as: NOVOLIN R Inject 14 Units into the skin 2 (two) times daily before a meal.   INSULIN SYRINGE .5CC/31GX5/16" 31G X 5/16" 0.5 ML Misc USE TO INJECT INSULIN 5 TIMES PER DAY   isosorbide mononitrate 30 MG 24 hr tablet Commonly known as: IMDUR TAKE 1 TABLET(30 MG) BY MOUTH DAILY What changed: See the new instructions.   mometasone 0.1 % cream Commonly known as: ELOCON Apply 1 application topically daily. Use as directed   multivitamin capsule Take 1 capsule by mouth daily.   nitroGLYCERIN 0.3 MG SL tablet Commonly known as: Nitrostat Place 1 tablet (0.3 mg total) under the  tongue every 5 (five) minutes as needed for chest pain (ER if no better after 3 tablets).   omeprazole 20 MG capsule Commonly known as: PRILOSEC TAKE 1 CAPSULE(20 MG) BY MOUTH DAILY   ONE TOUCH ULTRA SYSTEM KIT w/Device Kit 1 kit by Does not apply route once.   OneTouch Ultra test strip Generic drug: glucose blood Use Onetouch Ultra test strips as instructed to check blood sugar twice daily.   Victoza  18 MG/3ML Sopn Generic drug: liraglutide ADMINISTER 1.2 MG UNDER THE SKIN DAILY          Objective:   Physical Exam BP (!) 158/56 (BP Location: Left Arm, Patient Position: Sitting, Cuff Size: Normal)   Pulse 69   Temp (!) 97.3 F (36.3 C) (Temporal)   Resp 18   Ht 5' 4"  (1.626 m)   Wt 186 lb (84.4 kg)   SpO2 99%   BMI 31.93 kg/m   General:   Well developed, NAD, BMI noted.  HEENT:  Normocephalic . Face symmetric, atraumatic Neck: Increase JVD at 45 degrees Lungs:  CTA B Normal respiratory effort, no intercostal retractions, no accessory muscle use. Heart: RRR,  no murmur.  Abdomen:  Not distended, soft, non-tender. No rebound or rigidity.   Skin: Not pale. Not jaundice Lower extremities: Trace pretibial edema bilaterally  Neurologic:  alert & oriented X3.  Speech normal, gait appropriate for age and unassisted Psych--  Cognition and judgment appear intact.  Cooperative with normal attention span and concentration.  Behavior appropriate. No anxious or depressed appearing.     Assessment     Assessment DM  Dr Dwyane Dee  +retinopathy  HTN Hyperlipidemia CRI creat ~1.4  (GFR ~ 38) Depression/anxiety: tranxene qhs prn (takes rarely) MSK: -DJD: hydrocodone (rx pcp) - T score -0.8 on December 2017, h/o a foot FX d/t walking years ago (likely a stress fracture), discussed treatment 04/2016:  exercise and cont Vit d Hem/Onc: Dr Marin Olp  -CLL -iron deficiency anemia: IV iron 2018 -B12 def CAD, Dr Gwenlyn Found MI 97 >> CABG, cath 12-13-2006, myoview 2012 no ischemic  CP: Stress test 08/05/2015, indeterminate risk study, + lateral ischemia: Cardiac catheterization 09/16/2015: Rx medical Venous insuff GI:  Dr Silverio Decamp ---GERD, IBS, h/o Gastritis ---H/o ulcerative colitis as a child H/o shingles  BCC Dr Allyson Sabal, bx 04-2015, MOH's 06-2016   PLAN DOE: DOE for 4 weeks approximately, she has a history of CAD, CRI, not a smoker, history of anemia. On exam JVD is somewhat increased, I noted weight gain, the patient has noted some lower extremity edema. EKG today: NSR, RBBB, not new. Plan: Chest x-ray, BMP,CBC, BNP.  Further advised with results.  ER if symptoms severe CAD: See comments above Last cardiology visit 01/21/2019, felt to be stable, no changes made. HTN: BP mildly elevated today, on Amlodipine, Tenormin, no change, monitor ambulatory BPs BP:ZWCH A1c 6.5 last month, per Endo. PHQ-9: Score was elevated but denies depression, most of the score comes from feeling tired and moving slowly. DJD: On hydrocodone, check a UDS RTC 6 weeks   This visit occurred during the SARS-CoV-2 public health emergency.  Safety protocols were in place, including screening questions prior to the visit, additional usage of staff PPE, and extensive cleaning of exam room while observing appropriate contact time as indicated for disinfecting solutions.

## 2019-05-02 NOTE — Patient Instructions (Addendum)
Please schedule Medicare Wellness with [name].   Per our records you are due for an eye exam. Please contact your eye doctor to schedule an appointment. Please have them send copies of your office visit notes to [place]. Our fax number is [phone].    Check the  blood pressure at home twice a week BP GOAL is between 110/65 and  135/85. If it is consistently higher or lower, let me know  If you get much more short of breath, please call or go to the ER   GO TO THE LAB : Get the blood work     [name], [place] back for for a checkup in 6 weeks  STOP BY THE FIRST FLOOR:  get the XR

## 2019-05-02 NOTE — Telephone Encounter (Signed)
Noted, h/o CLL

## 2019-05-02 NOTE — Progress Notes (Signed)
Pre visit review using our clinic review tool, if applicable. No additional management support is needed unless otherwise documented below in the visit note. 

## 2019-05-02 NOTE — Telephone Encounter (Signed)
CRITICAL VALUE STICKER  CRITICAL VALUE:  WBC   62.3  RECEIVER (on-site recipient of call):  Franchesca Veneziano, [place] NOTIFIED: 4/30 @ 3:47pm  MESSENGER (representative from lab):  MD NOTIFIED: Paz  TIME OF NOTIFICATION: 3:47pm  RESPONSE:

## 2019-05-03 ENCOUNTER — Other Ambulatory Visit: Payer: Self-pay | Admitting: Internal Medicine

## 2019-05-03 NOTE — Assessment & Plan Note (Signed)
DOE: DOE for 4 weeks approximately, she has a history of CAD, CRI, not a smoker, history of anemia. On exam JVD is somewhat increased, I noted weight gain, the patient has noted some lower extremity edema. EKG today: NSR, RBBB, not new. Plan: Chest x-ray, BMP,CBC, BNP.  Further advised with results.  ER if symptoms severe CAD: See comments above Last cardiology visit 01/21/2019, felt to be stable, no changes made. HTN: BP mildly elevated today, on Amlodipine, Tenormin, no change, monitor ambulatory BPs [id number] A1c 6.5 last month, per Endo. PHQ-9: Score was elevated but denies depression, most of the score comes from feeling tired and moving slowly. 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

## 2019-05-04 LAB — PAIN MGMT, PROFILE 8 W/CONF, U
6 Acetylmorphine: NEGATIVE ng/mL
Alcohol Metabolites: NEGATIVE ng/mL (ref <500)
Amphetamines: NEGATIVE ng/mL
Benzodiazepines: NEGATIVE ng/mL
Buprenorphine, Urine: NEGATIVE ng/mL
Cocaine Metabolite: NEGATIVE ng/mL
Codeine: NEGATIVE ng/mL
Creatinine: 45.9 mg/dL
Hydrocodone: 52 ng/mL
Hydromorphone: 73 ng/mL
MDMA: NEGATIVE ng/mL
Marijuana Metabolite: NEGATIVE ng/mL
Morphine: NEGATIVE ng/mL
Norhydrocodone: NEGATIVE ng/mL
Opiates: POSITIVE ng/mL
Oxidant: NEGATIVE ug/mL
Oxycodone: NEGATIVE ng/mL
pH: 5.5 (ref 4.5–9.0)

## 2019-05-05 DIAGNOSIS — M7062 Trochanteric bursitis, left hip: Secondary | ICD-10-CM | POA: Diagnosis not present

## 2019-05-05 DIAGNOSIS — S72142D Displaced intertrochanteric fracture of left femur, subsequent encounter for closed fracture with routine healing: Secondary | ICD-10-CM | POA: Diagnosis not present

## 2019-05-05 MED ORDER — FUROSEMIDE 20 MG PO TABS: 20.0000 mg | ORAL_TABLET | Freq: Every day | ORAL | 3 refills | Status: DC

## 2019-05-05 NOTE — Addendum Note (Signed)
Addended byDamita Dunnings D on: 05/05/2019 01:11 PM   Modules accepted: Orders

## 2019-05-09 ENCOUNTER — Encounter: Payer: Self-pay | Admitting: Cardiovascular Disease

## 2019-05-09 ENCOUNTER — Ambulatory Visit (INDEPENDENT_AMBULATORY_CARE_PROVIDER_SITE_OTHER): Payer: Medicare Other | Admitting: Cardiovascular Disease

## 2019-05-09 ENCOUNTER — Other Ambulatory Visit: Payer: Self-pay

## 2019-05-09 ENCOUNTER — Encounter: Payer: Self-pay | Admitting: *Deleted

## 2019-05-09 VITALS — BP 134/70 | HR 72 | Ht 66.0 in | Wt 183.0 lb

## 2019-05-09 DIAGNOSIS — R0600 Dyspnea, unspecified: Secondary | ICD-10-CM | POA: Diagnosis not present

## 2019-05-09 DIAGNOSIS — R0609 Other forms of dyspnea: Secondary | ICD-10-CM

## 2019-05-09 DIAGNOSIS — I25810 Atherosclerosis of coronary artery bypass graft(s) without angina pectoris: Secondary | ICD-10-CM

## 2019-05-09 NOTE — Patient Instructions (Signed)
Medication Instructions:  NO CHANGE *If you need a refill on your cardiac medications before your next appointment, please call your pharmacy*   Lab Work: If you have labs (blood work) drawn today and your tests are completely normal, you will receive your results only by: Marland Kitchen MyChart Message (if you have MyChart) OR . A paper copy in the mail If you have any lab test that is abnormal or we need to change your treatment, we will call you to review the results.   Testing/Procedures: Your physician has requested that you have an echocardiogram. Echocardiography is a painless test that uses sound waves to create images of your heart. It provides your doctor with information about the size and shape of your heart and how well your heart's chambers and valves are working. This procedure takes approximately one hour. There are no restrictions for this procedure.-SCHEDULE IN ONE WEEK  Your physician has requested that you have a lexiscan myoview. For further information please visit [email or web address]. Please follow instruction sheet, as given.SCHEDULE IN ONE WEEK     Follow-Up: At [facility], you and your health needs are our priority.  As part of our continuing mission to provide you with exceptional heart care, we have created designated Provider Care Teams.  These Care Teams include your primary Cardiologist (physician) and Advanced Practice Providers (APPs -  Physician Assistants and Nurse Practitioners) who all work together to provide you with the care you need, when you need it.  We recommend signing up for the patient portal called "MyChart".  Sign up information is provided on this After Visit Summary.  MyChart is used to connect with patients for Virtual Visits (Telemedicine).  Patients are able to view lab/test results, encounter notes, upcoming appointments, etc.  Non-urgent messages can be sent to your provider as well.   To learn more about what you can do with MyChart, go to  [email or web address].    Your next appointment:   2 week(s)  The format for your next appointment:   In Person  Provider:   Quay Burow, MD

## 2019-05-09 NOTE — Assessment & Plan Note (Signed)
[name] is been having shortness of breath for last several weeks.  It was more pronounced when she saw Dr. Larose Kells in the office a week ago.  Chest x-ray showed no active disease.  BNP was only mildly elevated but other labs were unremarkable.  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  Her last 2D echo performed 06/28/2018 revealed normal LV function.  I am going to repeat a 2D echo and a Lexiscan Myoview stress test to rule out an ischemic etiology.  She specifically denies chest pain.

## 2019-05-09 NOTE — Progress Notes (Signed)
05/09/2019 [place]   11/06/1940  [id number]  Primary Physician Colon Branch, MD Primary Cardiologist: Lorretta Harp MD [name], [place]  HPI:  [name] is a 79 y.o. mildly overweight widowed Caucasian female mother of 2 living children (son at age 22 died of an overdose), grandmother of 5 grandchildren who was a patient of Dr. Terance Ice.  I ultimately assumed her care.  I last saw her in the office  01/21/2019. Her cardiac risk factor profile is walkover treated diabetes, hypertension and hyperlipidemia. She has a strong family history of heart disease with a father who died of a myocardial infarction at age 22 and her mother at age 71. She had a heart attack in 1997 at which time I performed cardiac catheterization. Apparently I placed a balloon pump at that time and sent her to open heart surgery. Dr. Tharon Aquas Trigt performed coronary artery bypass grafting x5. She had her last catheterization performed by Dr. Rollene Fare December 2008 revealing patent grafts with normal LV function. She had a negative Myoview back in 2012. Since I saw her years ago she's had several episodes of chest pain most recent one this past Sunday which was more intense and prolonged.She had a Myoview stress test performed 08/05/15 that showed new anterolateral ischemia. She's had several episodes of chest pain since I saw her back a month ago. She underwent outpatient cardiac catheterization 09/16/15 revealing patent grafts with an unbypassed nondominant circumflex are normal LV function. Medical therapy was recommended. She really has had no significant chest pain since her heart catheterization. She saw [name] in the office 04/19/16 with atypical chest pain, dyspnea or lower extremity edema. A 2-D echo was ordered and was normal. 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 She did get iron infusion by Dr.Ennover. Her symptoms have somewhat  improved.  Since I saw her in the office 3 months ago she is complained of increasing dyspnea on exertion over the last several weeks more prominent approxi-1 week ago.  She saw Dr. Larose Kells in the office.  Chest x-ray was performed that showed no active disease and lab work likewise was fairly normal.  She denies chest pain..    Current Meds  Medication Sig  . alendronate (FOSAMAX) 70 MG tablet Take 1 tablet (70 mg total) by mouth every 7 (seven) days. Take with a full glass of water on an empty stomach. Remain upright for at least 30 minutes after taking.  Marland Kitchen amLODipine (NORVASC) 5 MG tablet TAKE 1 TABLET(5 MG) BY MOUTH DAILY  . aspirin 81 MG chewable tablet Chew 1 tablet (81 mg total) by mouth 2 (two) times daily with a meal.  . atenolol (TENORMIN) 100 MG tablet Take 1 tablet (100 mg total) by mouth daily.  Marland Kitchen atorvastatin (LIPITOR) 20 MG tablet TAKE 1 TABLET(20 MG) BY MOUTH DAILY  . azelastine (ASTELIN) 0.1 % nasal spray Place 2 sprays into both nostrils at bedtime as needed for rhinitis. Use in each nostril as directed  . Blood Glucose Monitoring Suppl (ONE TOUCH ULTRA SYSTEM KIT) W/DEVICE KIT 1 kit by Does not apply route once.  . cholecalciferol (VITAMIN D) 1000 UNITS tablet Take 1,000 Units by mouth daily.    . clopidogrel (PLAVIX) 75 MG tablet Take 1 tablet (75 mg total) by mouth daily.  . clorazepate (TRANXENE) 7.5 MG tablet Take 1 tablet (7.5 mg total) by mouth as needed.  Marland Kitchen  fluticasone (FLONASE) 50 MCG/ACT nasal spray SHAKE LIQUID AND USE 2 SPRAYS IN EACH NOSTRIL DAILY (Patient taking differently: Place 2 sprays into both nostrils daily as needed for allergies. )  . furosemide (LASIX) 20 MG tablet Take 1 tablet (20 mg total) by mouth daily.  Marland Kitchen glucose blood (ONETOUCH ULTRA) test strip Use Onetouch Ultra test strips as instructed to check blood sugar twice daily.  . insulin NPH Human (HUMULIN N,NOVOLIN N) 100 UNIT/ML injection Inject 38 units into the skin every morning and inject 20 units  into the skin at bedtime.  . insulin regular (NOVOLIN R,HUMULIN R) 100 units/mL injection Inject 14 Units into the skin 2 (two) times daily before a meal.   . Insulin Syringe-Needle U-100 (INSULIN SYRINGE .5CC/31GX5/16") 31G X 5/16" 0.5 ML MISC USE TO INJECT INSULIN 5 TIMES PER DAY  . isosorbide mononitrate (IMDUR) 30 MG 24 hr tablet TAKE 1 TABLET(30 MG) BY MOUTH DAILY (Patient taking differently: Take 30 mg by mouth daily. TAKE 1 TABLET(30 MG) BY MOUTH DAILY)  . mometasone (ELOCON) 0.1 % cream Apply 1 application topically daily. Use as directed  . Multiple Vitamin (MULTIVITAMIN) capsule Take 1 capsule by mouth daily.    . nitroGLYCERIN (NITROSTAT) 0.3 MG SL tablet Place 1 tablet (0.3 mg total) under the tongue every 5 (five) minutes as needed for chest pain (ER if no better after 3 tablets).  Marland Kitchen omeprazole (PRILOSEC) 20 MG capsule TAKE 1 CAPSULE(20 MG) BY MOUTH DAILY  . VICTOZA 18 MG/3ML SOPN ADMINISTER 1.2 MG UNDER THE SKIN DAILY     Allergies  Allergen Reactions  . Codeine     REACTION: makes her nervous, orTylenol #3  . Ibuprofen     REACTION: nervous  . Meperidine Hcl     REACTION: nasuea and vomitting  . Naproxen Sodium     REACTION: nervous    Social History   Socioeconomic History  . Marital status: Widowed    Spouse name: Not on file  . Number of children: 2  . Years of education: Not on file  . Highest education level: Not on file  Occupational History  . Occupation: retired-- [employer]     Employer: RETIRED  Tobacco Use  . Smoking status: Never Smoker  . Smokeless tobacco: Never Used  Substance and Sexual Activity  . Alcohol use: No    Alcohol/week: 0.0 standard drinks  . Drug use: No  . Sexual activity: Not Currently  Other Topics Concern  . Not on file  Social History Narrative   Lives by herself, lost husband ~ 2004 , still drives    1 child in [place]   I child in [place]   Social Determinants of Health   Financial Resource Strain: Low Risk   . Difficulty  of Paying Living Expenses: Not hard at all  Food Insecurity: No Food Insecurity  . Worried About Charity fundraiser in the Last Year: Never true  . Ran Out of Food in the Last Year: Never true  Transportation Needs: No Transportation Needs  . Lack of Transportation (Medical): No  . Lack of Transportation (Non-Medical): No  Physical Activity: Inactive  . Days of Exercise per Week: 0 days  . Minutes of Exercise per Session: 0 min  Stress: No Stress Concern Present  . Feeling of Stress : Not at all  Social Connections: Unknown  . Frequency of Communication with Friends and Family: More than three times a week  . Frequency of Social Gatherings with Friends and Family:  More than three times a week  . Attends Religious Services: More than 4 times per year  . Active Member of Clubs or Organizations: Patient refused  . Attends Archivist Meetings: Patient refused  . Marital Status: Patient refused  Intimate Partner Violence: Not At Risk  . Fear of Current or Ex-Partner: No  . Emotionally Abused: No  . Physically Abused: No  . Sexually Abused: No     Review of Systems: General: negative for chills, fever, night sweats or weight changes.  Cardiovascular: negative for chest pain, dyspnea on exertion, edema, orthopnea, palpitations, paroxysmal nocturnal dyspnea or shortness of breath Dermatological: negative for rash Respiratory: negative for cough or wheezing Urologic: negative for hematuria Abdominal: negative for nausea, vomiting, diarrhea, bright red blood per rectum, melena, or hematemesis Neurologic: negative for visual changes, syncope, or dizziness All other systems reviewed and are otherwise negative except as noted above.    Blood pressure 134/70, pulse 72, height 5' 6"  (1.676 m), weight 183 lb (83 kg), SpO2 98 %.  General appearance: alert and no distress Neck: no adenopathy, no carotid bruit, no JVD, supple, symmetrical, trachea midline and thyroid not enlarged,  symmetric, no tenderness/mass/nodules Lungs: clear to auscultation bilaterally Heart: regular rate and rhythm, S1, S2 normal, no murmur, click, rub or gallop Extremities: extremities normal, atraumatic, no cyanosis or edema Pulses: 2+ and symmetric Skin: Skin color, texture, turgor normal. No rashes or lesions Neurologic: Alert and oriented X 3, normal strength and tone. Normal symmetric reflexes. Normal coordination and gait  EKG not performed today  ASSESSMENT AND PLAN:   Shortness of breath Ms. Spitler is been having shortness of breath for last several weeks.  It was more pronounced when she saw Dr. Larose Kells in the office a week ago.  Chest x-ray showed no active disease.  BNP was only mildly elevated but other labs were unremarkable.  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  Her last 2D echo performed 06/28/2018 revealed normal LV function.  I am going to repeat a 2D echo and a Lexiscan Myoview stress test to rule out an ischemic etiology.  She specifically denies chest pain.      Lorretta Harp MD FACP,FACC,FAHA, [facility] 05/09/2019 11:07 AM

## 2019-05-12 ENCOUNTER — Telehealth (HOSPITAL_COMMUNITY): Payer: Self-pay

## 2019-05-12 ENCOUNTER — Other Ambulatory Visit: Payer: Self-pay

## 2019-05-12 ENCOUNTER — Ambulatory Visit (HOSPITAL_COMMUNITY)
Admission: RE | Admit: 2019-05-12 | Discharge: 2019-05-12 | Disposition: A | Discharge status: Home / Self Care | Payer: Medicare Other | Source: Ambulatory Visit | Attending: Cardiovascular Disease | Admitting: Cardiovascular Disease

## 2019-05-12 ENCOUNTER — Other Ambulatory Visit (INDEPENDENT_AMBULATORY_CARE_PROVIDER_SITE_OTHER): Payer: Medicare Other

## 2019-05-12 DIAGNOSIS — R0600 Dyspnea, unspecified: Secondary | ICD-10-CM | POA: Diagnosis not present

## 2019-05-12 DIAGNOSIS — K219 Gastro-esophageal reflux disease without esophagitis: Secondary | ICD-10-CM | POA: Diagnosis not present

## 2019-05-12 DIAGNOSIS — I119 Hypertensive heart disease without heart failure: Secondary | ICD-10-CM | POA: Diagnosis not present

## 2019-05-12 DIAGNOSIS — I25810 Atherosclerosis of coronary artery bypass graft(s) without angina pectoris: Secondary | ICD-10-CM | POA: Diagnosis not present

## 2019-05-12 DIAGNOSIS — E119 Type 2 diabetes mellitus without complications: Secondary | ICD-10-CM | POA: Insufficient documentation

## 2019-05-12 DIAGNOSIS — E785 Hyperlipidemia, unspecified: Secondary | ICD-10-CM | POA: Insufficient documentation

## 2019-05-12 DIAGNOSIS — R0609 Other forms of dyspnea: Secondary | ICD-10-CM

## 2019-05-12 DIAGNOSIS — R079 Chest pain, unspecified: Secondary | ICD-10-CM | POA: Diagnosis not present

## 2019-05-12 LAB — BASIC METABOLIC PANEL
BUN: 19 mg/dL (ref 6–23)
CO2: 27 mEq/L (ref 19–32)
Calcium: 9.1 mg/dL (ref 8.4–10.5)
Chloride: 107 mEq/L (ref 96–112)
Creatinine, Ser: 1.22 mg/dL — ABNORMAL HIGH (ref 0.40–1.20)
GFR: 42.44 mL/min — ABNORMAL LOW (ref >60.00)
Glucose, Bld: 143 mg/dL — ABNORMAL HIGH (ref 70–99)
Potassium: 4.1 mEq/L (ref 3.5–5.1)
Sodium: 142 mEq/L (ref 135–145)

## 2019-05-12 NOTE — Progress Notes (Signed)
  Echocardiogram 2D Echocardiogram has been performed.  [name] 05/12/2019, 11:53 AM

## 2019-05-12 NOTE — Telephone Encounter (Signed)
Detailed instructions left on the patient's answering machine. Asked to call back with any questions. S.Boneta Standre EMTP 

## 2019-05-13 NOTE — Addendum Note (Signed)
Addended byDamita Dunnings D on: 05/13/2019 02:37 PM   Modules accepted: Orders

## 2019-05-15 ENCOUNTER — Ambulatory Visit (HOSPITAL_COMMUNITY): Payer: Medicare Other | Attending: Cardiology

## 2019-05-15 ENCOUNTER — Other Ambulatory Visit: Payer: Self-pay

## 2019-05-15 DIAGNOSIS — R0600 Dyspnea, unspecified: Secondary | ICD-10-CM | POA: Diagnosis not present

## 2019-05-15 DIAGNOSIS — R0609 Other forms of dyspnea: Secondary | ICD-10-CM

## 2019-05-15 LAB — MYOCARDIAL PERFUSION IMAGING
LV dias vol: 81 mL (ref 46–106)
LV sys vol: 31 mL
Peak HR: 76 {beats}/min
Rest HR: 67 {beats}/min
SDS: 7
SRS: 2
SSS: 9
TID: 1.03

## 2019-05-15 MED ORDER — TECHNETIUM TC 99M TETROFOSMIN IV KIT
32.3000 | PACK | Freq: Once | INTRAVENOUS | Status: AC | PRN
  Administered 2019-05-15: 32.3 via INTRAVENOUS
  Filled 2019-05-15: qty 33

## 2019-05-15 MED ORDER — REGADENOSON 0.4 MG/5ML IV SOLN
0.4000 mg | Freq: Once | INTRAVENOUS | Status: AC
  Administered 2019-05-15: 0.4 mg via INTRAVENOUS

## 2019-05-15 MED ORDER — TECHNETIUM TC 99M TETROFOSMIN IV KIT
10.2000 | PACK | Freq: Once | INTRAVENOUS | Status: AC | PRN
  Administered 2019-05-15: 10.2 via INTRAVENOUS
  Filled 2019-05-15: qty 11

## 2019-05-21 ENCOUNTER — Other Ambulatory Visit: Payer: Self-pay

## 2019-05-21 ENCOUNTER — Encounter: Payer: Self-pay | Admitting: Cardiovascular Disease

## 2019-05-21 ENCOUNTER — Ambulatory Visit (INDEPENDENT_AMBULATORY_CARE_PROVIDER_SITE_OTHER): Payer: Medicare Other | Admitting: Cardiovascular Disease

## 2019-05-21 VITALS — BP 166/76 | HR 80 | Ht 66.0 in | Wt 186.6 lb

## 2019-05-21 DIAGNOSIS — R0600 Dyspnea, unspecified: Secondary | ICD-10-CM | POA: Diagnosis not present

## 2019-05-21 DIAGNOSIS — I25810 Atherosclerosis of coronary artery bypass graft(s) without angina pectoris: Secondary | ICD-10-CM | POA: Diagnosis not present

## 2019-05-21 DIAGNOSIS — R0609 Other forms of dyspnea: Secondary | ICD-10-CM

## 2019-05-21 NOTE — Patient Instructions (Signed)
Medication Instructions:  The current medical regimen is effective;  continue present plan and medications.  *If you need a refill on your cardiac medications before your next appointment, please call your pharmacy*   Follow-Up: At [facility], you and your health needs are our priority.  As part of our continuing mission to provide you with exceptional heart care, we have created designated Provider Care Teams.  These Care Teams include your primary Cardiologist (physician) and Advanced Practice Providers (APPs -  Physician Assistants and Nurse Practitioners) who all work together to provide you with the care you need, when you need it.  We recommend signing up for the patient portal called "MyChart".  Sign up information is provided on this After Visit Summary.  MyChart is used to connect with patients for Virtual Visits (Telemedicine).  Patients are able to view lab/test results, encounter notes, upcoming appointments, etc.  Non-urgent messages can be sent to your provider as well.   To learn more about what you can do with MyChart, go to [email or web address].    Your next appointment:   2 month(s)  The format for your next appointment:   In Person  Provider:   Quay Burow, MD

## 2019-05-21 NOTE — Progress Notes (Signed)
Ms. Snelgrove returns today for follow-up of her outpatient test done in the evaluation of progressive dyspnea on exertion.  She had a 2D echo performed 05/12/2019 which was entirely normal.  A Myoview stress test showed no ischemia.  She really denies chest pain but most of her symptoms are pulmonary related.  I have asked her to have her primary care doctor referred to a pulmonologist for further evaluation.  I will see her back in 2 months.  If she continues to have progressive dyspnea we will entertain doing right left heart cath.  Lorretta Harp, M.D., [place], [facility], [place] [address] [place], [place]  [address]  [phone] 05/21/2019 10:30 AM

## 2019-06-04 ENCOUNTER — Other Ambulatory Visit: Payer: Self-pay | Admitting: Internal Medicine

## 2019-06-04 DIAGNOSIS — Z1231 Encounter for screening mammogram for malignant neoplasm of breast: Secondary | ICD-10-CM

## 2019-06-05 ENCOUNTER — Other Ambulatory Visit: Payer: Self-pay | Admitting: Endocrinology

## 2019-06-11 ENCOUNTER — Other Ambulatory Visit: Payer: Self-pay

## 2019-06-11 ENCOUNTER — Encounter: Payer: Self-pay | Admitting: Internal Medicine

## 2019-06-11 ENCOUNTER — Ambulatory Visit (INDEPENDENT_AMBULATORY_CARE_PROVIDER_SITE_OTHER): Payer: Medicare Other | Admitting: Internal Medicine

## 2019-06-11 VITALS — BP 163/60 | HR 68 | Temp 97.6°F | Resp 20 | Ht 66.0 in | Wt 185.4 lb

## 2019-06-11 DIAGNOSIS — I25810 Atherosclerosis of coronary artery bypass graft(s) without angina pectoris: Secondary | ICD-10-CM | POA: Diagnosis not present

## 2019-06-11 DIAGNOSIS — I10 Essential (primary) hypertension: Secondary | ICD-10-CM | POA: Diagnosis not present

## 2019-06-11 DIAGNOSIS — R0602 Shortness of breath: Secondary | ICD-10-CM

## 2019-06-11 NOTE — Patient Instructions (Addendum)
Please schedule Medicare Wellness with Glenard Haring.   Per our records you are due for an eye exam. Please contact your eye doctor to schedule an appointment. Please have them send copies of your office visit notes to [place]. Our fax number is [phone].  Call the pulmonary clinic to get visit with them: [phone]  Continue checking your blood pressures BP GOAL is between 110/65 and  135/85. If it is consistently higher or lower, let me know     GO TO THE FRONT DESK, [place] back for   a checkup in 3 months

## 2019-06-11 NOTE — Progress Notes (Signed)
Pre visit review using our clinic review tool, if applicable. No additional management support is needed unless otherwise documented below in the visit note. 

## 2019-06-11 NOTE — Progress Notes (Signed)
Subjective:    Patient ID: [name], female    DOB: 1940/09/13, 79 y.o.   MRN: [id number]  DOS:  06/11/2019 Type of visit - description: Routine visit Follow-up from previous visit. We talk about DOE, hypertension. DOE continue.  BP Readings from Last 3 Encounters:  06/11/19 (!) 163/60  05/21/19 (!) 166/76  05/09/19 134/70     Review of Systems Denies lower extremity edema except at the end of the day. No nausea, vomiting, diarrhea Rarely has a dry cough.  Past Medical History:  Diagnosis Date  . Acute blood loss anemia 09/17/2015  . Allergy   . Anemia   . Anginal pain ([place]) 2017  . Anxiety   . Arthritis    "hands and knees mostly" (09/16/2015)  . B12 deficiency anemia   . CAD (coronary artery disease)    Dr Gwenlyn Found  . Cataract    bil cataracts removed  . 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 Clotting disorder ([place])   . Depression   . DJD (degenerative joint disease)   . 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 Gastritis   . GERD (gastroesophageal reflux disease)   . Headache(784.0)   . History of blood transfusion    "I"ve had 20 some; thru birth of children, loss of blood, last 2 were in ~ 1999 before my cancer surgery" (09/16/2015)  . History of hiatal hernia   . History of shingles   . Hx of colonic polyps   . Hyperlipidemia   . Hypertension   . 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 Myocardial infarction ([place])    1997  . Pneumonia    X 1  . Postoperative hematoma involving circulatory system following cardiac catheterization 09/17/2015  . S/P cardiac cath 09/16/15 09/17/2015  . Type II diabetes mellitus ([place])   . 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 Venous insufficiency     Past Surgical History:  Procedure Laterality Date  . CARDIAC CATHETERIZATION  1997; 2008; 09/16/2015  . CARDIAC CATHETERIZATION N/A 09/16/2015   Procedure: Right/Left Heart Cath  and Coronary/Graft Angiography;  Surgeon: Lorretta Harp, MD;  Location: [facility];  Service: Cardiovascular;  Laterality: N/A;  . CESAREAN SECTION  1966  . COLONOSCOPY    . CORONARY ARTERY BYPASS GRAFT  1997   CABG X5  . EYE SURGERY     2 laser surgeries on left eye with implant  . EYE SURGERY Bilateral    cataracts  . FEMUR IM NAIL Left 08/28/2018   Procedure: INTRAMEDULLARY (IM) NAIL FEMORAL;  Surgeon: Rod Can, MD;  Location: [facility];  Service: Orthopedics;  Laterality: Left;  . FRACTURE SURGERY    . HERNIA REPAIR    . LAPAROSCOPIC ASSISTED VENTRAL HERNIA REPAIR  09/2008    with incarcerated colon;  Dr. Lucia Gaskins  . MOHS SURGERY  06/2016   BCC  . PATELLA FRACTURE SURGERY Left 11/1994   "crushed my knee"; put in a plate & 6 screws"  . POLYPECTOMY    . REFRACTIVE SURGERY Left 07/30/2015  . 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 March Rummage  . SHOULDER ARTHROSCOPY W/ ROTATOR CUFF REPAIR Right 1997  . SMALL INTESTINE SURGERY    . TONSILLECTOMY  1960s  . TOTAL KNEE ARTHROPLASTY WITH HARDWARE REMOVAL Left 12/1994    (infex, hardware removed )  . TUBAL LIGATION  1966  Allergies as of 06/11/2019      Reactions   Codeine    REACTION: makes her nervous, orTylenol #3   Ibuprofen    REACTION: nervous   Meperidine Hcl    REACTION: nasuea and vomitting   Naproxen Sodium    REACTION: nervous      Medication List       Accurate as of June 11, 2019 11:59 PM. If you have any questions, ask your nurse or doctor.        alendronate 70 MG tablet Commonly known as: FOSAMAX Take 1 tablet (70 mg total) by mouth every 7 (seven) days. Take with a full glass of water on an empty stomach. Remain upright for at least 30 minutes after taking.   amLODipine 5 MG tablet Commonly known as: NORVASC TAKE 1 TABLET(5 MG) BY MOUTH DAILY   aspirin 81 MG chewable tablet Chew 1 tablet (81 mg total) by mouth 2 (two) times daily with a meal.   atenolol 100 MG tablet Commonly  known as: TENORMIN Take 1 tablet (100 mg total) by mouth daily.   atorvastatin 20 MG tablet Commonly known as: LIPITOR TAKE 1 TABLET(20 MG) BY MOUTH DAILY   azelastine 0.1 % nasal spray Commonly known as: ASTELIN Place 2 sprays into both nostrils at bedtime as needed for rhinitis. Use in each nostril as directed   cholecalciferol 1000 units tablet Commonly known as: VITAMIN D Take 1,000 Units by mouth daily.   clopidogrel 75 MG tablet Commonly known as: PLAVIX Take 1 tablet (75 mg total) by mouth daily.   clorazepate 7.5 MG tablet Commonly known as: TRANXENE Take 1 tablet (7.5 mg total) by mouth as needed.   fluticasone 50 MCG/ACT nasal spray Commonly known as: FLONASE SHAKE LIQUID AND USE 2 SPRAYS IN EACH NOSTRIL DAILY What changed: See the Sara instructions.   insulin NPH Human 100 UNIT/ML injection Commonly known as: NOVOLIN N Inject 38 units into the skin every morning and inject 20 units into the skin at bedtime.   insulin regular 100 units/mL injection Commonly known as: NOVOLIN R Inject 14 Units into the skin 2 (two) times daily before a meal.   INSULIN SYRINGE .5CC/31GX5/16" 31G X 5/16" 0.5 ML Misc USE TO INJECT INSULIN 5 TIMES PER DAY   isosorbide mononitrate 30 MG 24 hr tablet Commonly known as: IMDUR TAKE 1 TABLET(30 MG) BY MOUTH DAILY What changed: See the Sara instructions.   mometasone 0.1 % cream Commonly known as: ELOCON Apply 1 application topically daily. Use as directed   multivitamin capsule Take 1 capsule by mouth daily.   nitroGLYCERIN 0.3 MG SL tablet Commonly known as: Nitrostat Place 1 tablet (0.3 mg total) under the tongue every 5 (five) minutes as needed for chest pain (ER if no better after 3 tablets).   omeprazole 20 MG capsule Commonly known as: PRILOSEC TAKE 1 CAPSULE(20 MG) BY MOUTH DAILY   ONE TOUCH ULTRA SYSTEM KIT w/Device Kit 1 kit by Does not apply route once.   OneTouch Ultra test strip Generic drug: glucose blood Use  Onetouch Ultra test strips as instructed to check blood sugar twice daily.   Victoza 18 MG/3ML Sopn Generic drug: liraglutide ADMINISTER 1.2 MG UNDER THE SKIN DAILY          Objective:   Physical Exam BP (!) 163/60 (BP Location: Left Arm, Patient Position: Sitting, Cuff Size: Small)   Pulse 68   Temp 97.6 F (36.4 C) (Temporal)   Resp 20   Ht  5' 6"  (1.676 m)   Wt 185 lb 6 oz (84.1 kg)   SpO2 97%   BMI 29.92 kg/m  General:   Well developed, NAD, BMI noted. HEENT:  Normocephalic . Face symmetric, atraumatic Lungs:  CTA B Normal respiratory effort, no intercostal retractions, no accessory muscle use. Heart: RRR,  no murmur.  Lower extremities: Trace periankle edema bilaterally  Skin: Not pale. Not jaundice Neurologic:  alert & oriented X3.  Speech normal, gait assisted by a cane Psych--  Cognition and judgment appear intact.  Cooperative with normal attention span and concentration.  Behavior appropriate. No anxious or depressed appearing.      Assessment     Assessment DM  Dr Dwyane Dee  +retinopathy  HTN Hyperlipidemia CRI creat ~1.4  (GFR ~ 38) Depression/anxiety: tranxene qhs prn (takes rarely) MSK: -DJD: hydrocodone (rx pcp) - T score -0.8 on December 2017, h/o a foot FX d/t walking years ago (likely a stress fracture), discussed treatment 04/2016:  exercise and cont Vit d Hem/Onc: Dr Marin Olp  -CLL -iron deficiency anemia: IV iron 2018 -B12 def CAD, Dr Gwenlyn Found MI 97 >> CABG, cath 12-13-2006, myoview 2012 no ischemic CP: Stress test 08/05/2015, indeterminate risk study, + lateral ischemia: Cardiac catheterization 09/16/2015: Rx medical Venous insuff GI:  Dr Silverio Decamp ---GERD, IBS, h/o Gastritis ---H/o ulcerative colitis as a child H/o shingles  BCC Dr Allyson Sabal, bx 04-2015, MOH's [phone]   PLAN DOE, since the last visit labs, chest x-ray were essentially unremarkable.  She saw cardiology twice, echo 05/12/2019 was normal.  Myoview stress test negative for  ischemia. They recommended a pulmonary referral. Patient continue with symptoms, minimal exertion triggers DOE. On chart review,CT chest 10/2016 with unremarkable lungs She was never a smoker. Plan: Refer to pulmonary, she may need further evaluation although at the end symptoms could be multifactorial. HTN: BP elevated today, at home 147/57, 119/55.  Continue amlodipine, Tenormin, monitor BPs. RTC 3 months   This visit occurred during the SARS-CoV-2 public health emergency.  Safety protocols were in place, including screening questions prior to the visit, additional usage of staff PPE, and extensive cleaning of exam room while observing appropriate contact time as indicated for disinfecting solutions.

## 2019-06-12 NOTE — Assessment & Plan Note (Signed)
DOE, since the last visit labs, chest x-ray were essentially unremarkable.  She saw cardiology twice, echo 05/12/2019 was normal.  Myoview stress test negative for ischemia. They recommended a pulmonary referral. Patient continue with symptoms, minimal exertion triggers DOE. On chart review,CT chest 10/2016 with unremarkable lungs She was never a smoker. Plan: Refer to pulmonary, she may need further evaluation although at the end symptoms could be multifactorial. HTN: BP elevated today, at home 147/57, 119/55.  Continue amlodipine, Tenormin, monitor BPs. RTC 3 months

## 2019-06-27 ENCOUNTER — Ambulatory Visit
Admission: RE | Admit: 2019-06-27 | Discharge: 2019-06-27 | Disposition: A | Discharge status: Home / Self Care | Payer: Medicare Other | Source: Ambulatory Visit | Attending: Internal Medicine | Admitting: Internal Medicine

## 2019-06-27 ENCOUNTER — Other Ambulatory Visit: Payer: Self-pay

## 2019-06-27 DIAGNOSIS — Z1231 Encounter for screening mammogram for malignant neoplasm of breast: Secondary | ICD-10-CM

## 2019-07-08 ENCOUNTER — Inpatient Hospital Stay: Payer: Medicare Other | Admitting: Hematology & Oncology

## 2019-07-08 ENCOUNTER — Encounter: Payer: Self-pay | Admitting: Family

## 2019-07-08 ENCOUNTER — Inpatient Hospital Stay: Payer: Medicare Other

## 2019-07-08 ENCOUNTER — Other Ambulatory Visit: Payer: Self-pay | Admitting: Endocrinology

## 2019-07-08 ENCOUNTER — Telehealth: Payer: Self-pay | Admitting: *Deleted

## 2019-07-08 ENCOUNTER — Other Ambulatory Visit: Payer: Self-pay

## 2019-07-08 ENCOUNTER — Ambulatory Visit: Payer: Medicare Other | Admitting: Family

## 2019-07-08 ENCOUNTER — Inpatient Hospital Stay (HOSPITAL_BASED_OUTPATIENT_CLINIC_OR_DEPARTMENT_OTHER): Payer: Medicare Other | Admitting: Family

## 2019-07-08 ENCOUNTER — Inpatient Hospital Stay: Payer: Medicare Other | Attending: Hematology & Oncology

## 2019-07-08 VITALS — BP 157/67 | HR 70 | Temp 98.4°F | Resp 18 | Ht 66.0 in | Wt 186.6 lb

## 2019-07-08 DIAGNOSIS — K909 Intestinal malabsorption, unspecified: Secondary | ICD-10-CM | POA: Diagnosis not present

## 2019-07-08 DIAGNOSIS — D509 Iron deficiency anemia, unspecified: Secondary | ICD-10-CM | POA: Insufficient documentation

## 2019-07-08 DIAGNOSIS — Z85038 Personal history of other malignant neoplasm of large intestine: Secondary | ICD-10-CM | POA: Diagnosis not present

## 2019-07-08 DIAGNOSIS — C9110 Chronic lymphocytic leukemia of B-cell type not having achieved remission: Secondary | ICD-10-CM | POA: Insufficient documentation

## 2019-07-08 DIAGNOSIS — M7989 Other specified soft tissue disorders: Secondary | ICD-10-CM | POA: Diagnosis not present

## 2019-07-08 DIAGNOSIS — R202 Paresthesia of skin: Secondary | ICD-10-CM | POA: Diagnosis not present

## 2019-07-08 DIAGNOSIS — I25810 Atherosclerosis of coronary artery bypass graft(s) without angina pectoris: Secondary | ICD-10-CM | POA: Diagnosis not present

## 2019-07-08 DIAGNOSIS — R5383 Other fatigue: Secondary | ICD-10-CM | POA: Diagnosis not present

## 2019-07-08 DIAGNOSIS — D508 Other iron deficiency anemias: Secondary | ICD-10-CM | POA: Diagnosis not present

## 2019-07-08 LAB — CBC WITH DIFFERENTIAL (CANCER CENTER ONLY)
Band Neutrophils: 0 %
Basophils Absolute: 0.1 10*3/uL (ref 0.0–0.1)
Basophils Relative: 0 %
Eosinophils Absolute: 0.2 10*3/uL (ref 0.0–0.5)
Eosinophils Relative: 0 %
HCT: 36 % (ref 36.0–46.0)
Hemoglobin: 11.5 g/dL — ABNORMAL LOW (ref 12.0–15.0)
Lymphocytes Relative: 83 %
Lymphs Abs: 53.6 10*3/uL — ABNORMAL HIGH (ref 0.7–4.0)
MCH: 30.6 pg (ref 26.0–34.0)
MCHC: 31.9 g/dL (ref 30.0–36.0)
MCV: 95.7 fL (ref 80.0–100.0)
Monocytes Absolute: 5.7 10*3/uL — ABNORMAL HIGH (ref 0.1–1.0)
Monocytes Relative: 9 %
Neutro Abs: 5.3 10*3/uL (ref 1.7–7.7)
Neutrophils Relative %: 8 %
Platelet Count: 190 10*3/uL (ref 150–400)
RBC: 3.76 MIL/uL — ABNORMAL LOW (ref 3.87–5.11)
RDW: 13.6 % (ref 11.5–15.5)
WBC Count: 65 10*3/uL (ref 4.0–10.5)
nRBC: 0 % (ref 0.0–0.2)

## 2019-07-08 LAB — CMP (CANCER CENTER ONLY)
ALT: 23 U/L (ref 0–44)
AST: 27 U/L (ref 15–41)
Albumin: 3.8 g/dL (ref 3.5–5.0)
Alkaline Phosphatase: 82 U/L (ref 38–126)
Anion gap: 9 (ref 5–15)
BUN: 23 mg/dL (ref 8–23)
CO2: 24 mmol/L (ref 22–32)
Calcium: 9 mg/dL (ref 8.9–10.3)
Chloride: 104 mmol/L (ref 98–111)
Creatinine: 1.69 mg/dL — ABNORMAL HIGH (ref 0.44–1.00)
GFR, Est AFR Am: 33 mL/min — ABNORMAL LOW (ref >60)
GFR, Estimated: 28 mL/min — ABNORMAL LOW (ref >60)
Glucose, Bld: 125 mg/dL — ABNORMAL HIGH (ref 70–99)
Potassium: 4.3 mmol/L (ref 3.5–5.1)
Sodium: 137 mmol/L (ref 135–145)
Total Bilirubin: 0.6 mg/dL (ref 0.3–1.2)
Total Protein: 7 g/dL (ref 6.5–8.1)

## 2019-07-08 LAB — SAVE SMEAR(SSMR), FOR PROVIDER SLIDE REVIEW

## 2019-07-08 NOTE — Progress Notes (Signed)
Hematology and Oncology Follow Up Visit  [place] [id number] May 12, 1940 79 y.o. 07/08/2019   Principle Diagnosis:  CLL -stage A Remote history of colon cancer Iron deficiency anemia  Current Therapy:        IV iron as indicated   Interim History:  [name] is here today for follow-up. She is feeling fatigued and states that her blood sugars are "horrible" despite being on insulin and Victoza. She notes swelling in her feet and ankles during the day that resolves at night. Pedal pulses are 2+. No redness or pitting noted at this time.  No numbness or tingling in her extremities at this time. She does note tingling in her legs sometimes at night.  She has not noted any episodes of bleeding. No bruising or petechiae.  Labs reviewed with Dr. Marin Olp. WBC count is stable at 65, Hgb 11.5, platelets 190.  No fever, n/v, cough, rash, dizziness, chest pain, palpitations, abdominal pain or changes in bowel or bladder habits.  No falls or syncope to report. She ambulates with a cane for added support.  She has maintained a good appetite and is staying well hydrated. Her weight is stable.   ECOG Performance Status: 1 - Symptomatic but completely ambulatory  Medications:  Allergies as of 07/08/2019      Reactions   Codeine    REACTION: makes her nervous, orTylenol #3   Ibuprofen    REACTION: nervous   Meperidine Hcl    REACTION: nasuea and vomitting   Naproxen Sodium    REACTION: nervous      Medication List       Accurate as of July 08, 2019  2:38 PM. If you have any questions, ask your nurse or doctor.        alendronate 70 MG tablet Commonly known as: FOSAMAX Take 1 tablet (70 mg total) by mouth every 7 (seven) days. Take with a full glass of water on an empty stomach. Remain upright for at least 30 minutes after taking.   amLODipine 5 MG tablet Commonly known as: NORVASC TAKE 1 TABLET(5 MG) BY MOUTH DAILY   aspirin 81 MG chewable tablet Chew 1 tablet (81 mg  total) by mouth 2 (two) times daily with a meal.   atenolol 100 MG tablet Commonly known as: TENORMIN Take 1 tablet (100 mg total) by mouth daily.   atorvastatin 20 MG tablet Commonly known as: LIPITOR TAKE 1 TABLET(20 MG) BY MOUTH DAILY   azelastine 0.1 % nasal spray Commonly known as: ASTELIN Place 2 sprays into both nostrils at bedtime as needed for rhinitis. Use in each nostril as directed   cholecalciferol 1000 units tablet Commonly known as: VITAMIN D Take 1,000 Units by mouth daily.   clopidogrel 75 MG tablet Commonly known as: PLAVIX Take 1 tablet (75 mg total) by mouth daily.   clorazepate 7.5 MG tablet Commonly known as: TRANXENE Take 1 tablet (7.5 mg total) by mouth as needed.   fluticasone 50 MCG/ACT nasal spray Commonly known as: FLONASE SHAKE LIQUID AND USE 2 SPRAYS IN EACH NOSTRIL DAILY What changed: See the new instructions.   insulin NPH Human 100 UNIT/ML injection Commonly known as: NOVOLIN N Inject 38 units into the skin every morning and inject 20 units into the skin at bedtime.   insulin regular 100 units/mL injection Commonly known as: NOVOLIN R Inject 14 Units into the skin 2 (two) times daily before a meal.   INSULIN SYRINGE .5CC/31GX5/16" 31G X 5/16" 0.5 ML Misc USE  TO INJECT INSULIN 5 TIMES PER DAY   isosorbide mononitrate 30 MG 24 hr tablet Commonly known as: IMDUR TAKE 1 TABLET(30 MG) BY MOUTH DAILY What changed: See the new instructions.   mometasone 0.1 % cream Commonly known as: ELOCON Apply 1 application topically daily. Use as directed   multivitamin capsule Take 1 capsule by mouth daily.   nitroGLYCERIN 0.3 MG SL tablet Commonly known as: Nitrostat Place 1 tablet (0.3 mg total) under the tongue every 5 (five) minutes as needed for chest pain (ER if no better after 3 tablets).   omeprazole 20 MG capsule Commonly known as: PRILOSEC TAKE 1 CAPSULE(20 MG) BY MOUTH DAILY   ONE TOUCH ULTRA SYSTEM KIT w/Device Kit 1 kit by Does  not apply route once.   OneTouch Ultra test strip Generic drug: glucose blood Use Onetouch Ultra test strips as instructed to check blood sugar twice daily.   Victoza 18 MG/3ML Sopn Generic drug: liraglutide ADMINISTER 1.2 MG UNDER THE SKIN DAILY       Allergies:  Allergies  Allergen Reactions  . Codeine     REACTION: makes her nervous, orTylenol #3  . Ibuprofen     REACTION: nervous  . Meperidine Hcl     REACTION: nasuea and vomitting  . Naproxen Sodium     REACTION: nervous    Past Medical History, Surgical history, Social history, and Family History were reviewed and updated.  Review of Systems: All other 10 point review of systems is negative.   Physical Exam:  vitals were not taken for this visit.   Wt Readings from Last 3 Encounters:  06/11/19 185 lb 6 oz (84.1 kg)  05/21/19 186 lb 9.6 oz (84.6 kg)  05/15/19 183 lb (83 kg)    Ocular: Sclerae unicteric, pupils equal, round and reactive to light Ear-nose-throat: Oropharynx clear, dentition fair Lymphatic: No cervical or supraclavicular adenopathy Lungs no rales or rhonchi, good excursion bilaterally Heart regular rate and rhythm, no murmur appreciated Abd soft, nontender, positive bowel sounds, no liver or spleen tip palpated on exam, no fluid wave  MSK no focal spinal tenderness, no joint edema Neuro: non-focal, well-oriented, appropriate affect Breasts: Deferred   Lab Results  Component Value Date   WBC 62.3 Repeated and verified X2. (HH) 05/02/2019   HGB 11.9 (L) 05/02/2019   HCT 37.4 05/02/2019   MCV 97.7 05/02/2019   PLT 192.0 05/02/2019   Lab Results  Component Value Date   FERRITIN 36 01/07/2019   IRON 66 01/07/2019   TIBC 361 01/07/2019   UIBC 295 01/07/2019   IRONPCTSAT 18 (L) 01/07/2019   Lab Results  Component Value Date   RBC 3.83 (L) 05/02/2019   Lab Results  Component Value Date   KAPLAMBRATIO 1.22 11/03/2016   Lab Results  Component Value Date   IGGSERUM 844 11/03/2016    IGA 281 02/25/2014   IGMSERUM 27 11/03/2016   Lab Results  Component Value Date   TOTALPROTELP 7.2 02/25/2014   ALBUMINELP 54.3 (L) 02/25/2014   A1GS 4.4 02/25/2014   A2GS 13.7 (H) 02/25/2014   BETS 7.0 02/25/2014   BETA2SER 6.2 02/25/2014   GAMS 14.4 02/25/2014   MSPIKE Not Observed 11/03/2016   SPEI * 02/25/2014     Chemistry      Component Value Date/Time   NA 142 05/12/2019 1302   NA 147 (H) 11/03/2016 1018   NA 143 08/30/2016 0954   K 4.1 05/12/2019 1302   K 3.9 11/03/2016 1018   K 4.5  08/30/2016 0954   CL 107 05/12/2019 1302   CL 110 (H) 11/03/2016 1018   CO2 27 05/12/2019 1302   CO2 27 11/03/2016 1018   CO2 24 08/30/2016 0954   BUN 19 05/12/2019 1302   BUN 25 (H) 11/03/2016 1018   BUN 20.5 08/30/2016 0954   CREATININE 1.22 (H) 05/12/2019 1302   CREATININE 1.31 (H) 01/07/2019 1029   CREATININE 1.6 (H) 11/03/2016 1018   CREATININE 1.4 (H) 08/30/2016 0954      Component Value Date/Time   CALCIUM 9.1 05/12/2019 1302   CALCIUM 10.1 11/03/2016 1018   CALCIUM 9.8 08/30/2016 0954   ALKPHOS 90 01/07/2019 1029   ALKPHOS 112 (H) 11/03/2016 1018   ALKPHOS 113 08/30/2016 0954   AST 17 01/07/2019 1029   AST 23 08/30/2016 0954   ALT 13 01/07/2019 1029   ALT 29 11/03/2016 1018   ALT 20 08/30/2016 0954   BILITOT 0.5 01/07/2019 1029   BILITOT 0.38 08/30/2016 0954       Impression and Plan: [name] is a very pleasant 79 yo caucasian female with CLL stage A. So far this has not been an issue for her.    She also has history of iron deficiency. Iron studies are pending. We will replace if needed.  We will plan to see her again in another 6 months.  She can contact our office with any questions or concerns. We can certainly see her sooner if needed.   Laverna Peace, NP 7/6/20212:38 PM

## 2019-07-08 NOTE — Telephone Encounter (Signed)
Richardson Landry from lab brought a panic lab value of WBC of 65 to me. Results given to MD.

## 2019-07-09 LAB — IGG, IGA, IGM
IgA: 187 mg/dL (ref 64–422)
IgG (Immunoglobin G), Serum: 800 mg/dL (ref 586–1602)
IgM (Immunoglobulin M), Srm: 30 mg/dL (ref 26–217)

## 2019-07-09 LAB — LACTATE DEHYDROGENASE: LDH: 209 U/L — ABNORMAL HIGH (ref 98–192)

## 2019-07-13 ENCOUNTER — Other Ambulatory Visit: Payer: Self-pay | Admitting: Cardiovascular Disease

## 2019-07-15 ENCOUNTER — Other Ambulatory Visit: Payer: Self-pay

## 2019-07-15 ENCOUNTER — Other Ambulatory Visit (INDEPENDENT_AMBULATORY_CARE_PROVIDER_SITE_OTHER): Payer: Medicare Other

## 2019-07-15 DIAGNOSIS — Z794 Long term (current) use of insulin: Secondary | ICD-10-CM

## 2019-07-15 DIAGNOSIS — E1165 Type 2 diabetes mellitus with hyperglycemia: Secondary | ICD-10-CM

## 2019-07-15 LAB — COMPREHENSIVE METABOLIC PANEL
ALT: 17 U/L (ref 0–35)
AST: 18 U/L (ref 0–37)
Albumin: 3.9 g/dL (ref 3.5–5.2)
Alkaline Phosphatase: 86 U/L (ref 39–117)
BUN: 28 mg/dL — ABNORMAL HIGH (ref 6–23)
CO2: 27 mEq/L (ref 19–32)
Calcium: 9.2 mg/dL (ref 8.4–10.5)
Chloride: 106 mEq/L (ref 96–112)
Creatinine, Ser: 1.48 mg/dL — ABNORMAL HIGH (ref 0.40–1.20)
GFR: 33.95 mL/min — ABNORMAL LOW (ref >60.00)
Glucose, Bld: 170 mg/dL — ABNORMAL HIGH (ref 70–99)
Potassium: 4.6 mEq/L (ref 3.5–5.1)
Sodium: 140 mEq/L (ref 135–145)
Total Bilirubin: 0.5 mg/dL (ref 0.2–1.2)
Total Protein: 6.5 g/dL (ref 6.0–8.3)

## 2019-07-15 LAB — HEMOGLOBIN A1C: Hgb A1c MFr Bld: 8 % — ABNORMAL HIGH (ref 4.6–6.5)

## 2019-07-16 ENCOUNTER — Encounter: Payer: Self-pay | Admitting: Internal Medicine

## 2019-07-17 ENCOUNTER — Other Ambulatory Visit: Payer: Self-pay

## 2019-07-17 ENCOUNTER — Encounter: Payer: Self-pay | Admitting: Endocrinology

## 2019-07-17 ENCOUNTER — Ambulatory Visit (INDEPENDENT_AMBULATORY_CARE_PROVIDER_SITE_OTHER): Payer: Medicare Other | Admitting: Endocrinology

## 2019-07-17 VITALS — BP 140/62 | HR 58 | Ht 66.0 in | Wt 188.4 lb

## 2019-07-17 DIAGNOSIS — I25810 Atherosclerosis of coronary artery bypass graft(s) without angina pectoris: Secondary | ICD-10-CM | POA: Diagnosis not present

## 2019-07-17 DIAGNOSIS — E1165 Type 2 diabetes mellitus with hyperglycemia: Secondary | ICD-10-CM | POA: Diagnosis not present

## 2019-07-17 DIAGNOSIS — Z794 Long term (current) use of insulin: Secondary | ICD-10-CM

## 2019-07-17 DIAGNOSIS — N1831 Chronic kidney disease, stage 3a: Secondary | ICD-10-CM

## 2019-07-17 DIAGNOSIS — I10 Essential (primary) hypertension: Secondary | ICD-10-CM

## 2019-07-17 MED ORDER — METFORMIN HCL 500 MG PO TABS
500.0000 mg | ORAL_TABLET | Freq: Every day | ORAL | 3 refills | Status: DC
Start: 2019-07-17 — End: 2020-06-28

## 2019-07-17 NOTE — Progress Notes (Signed)
Patient ID: [name], female   DOB: [birth year], 79 y.o.   MRN: [id number]            Reason for Appointment: Followup for Type 2 Diabetes   History of Present Illness:          Diagnosis: Type 2 diabetes mellitus, date of diagnosis: 1998       Past history:   She was treated with metformin at diagnosis when this had been continued until 2015 Subsequently Amaryl was also added several years ago and this has been continued Her blood sugars were under fair control between 2010 and early 2013 with A1c ranging from 7.4-9.4, mostly under 8% However since 08/2011 her A1c has been mostly over 8%  Insulin was added in 2014 with sma limited bottom ll doses of Lantus and this has been progressively increased She was started on mealtime insulin on her initial consultation in 9/15 because of high postprandial readings, sometimes over 300 With adding Victoza in 02/2014 her blood sugars were somewhat better with A1c coming down below 8%  Recent history:   INSULIN regimen: Regular 14 units before meals twice daily.  NPH 38 units in the morning and 20 hs  Non-insulin hypoglycemic drugs the patient is taking are: Victoza 1.2 mg daily.    Current blood sugar patterns, daily management and problems identified:   Her A1c is much higher than usual at 8%  Not clear why her blood sugars are higher, she has been off Metformin since about 09/2018  Has not had any steroids  Does not think her diet has changed; as before she is not able to do much exercise anyway  Her monitoring is very infrequent lately and has only 7 readings in the mornings in the last 2 weeks  Previously would have readings as high as 294 after dinner  Her weight is about the same lately  Hypoglycemia: She has had only rare hypoglycemia, none recently.  She will sometimes have a peppermint candy when she is not at home.  Does not carry any glucose tablets  Fasting blood sugars: Blood sugars are consistently high and  only once below 180, recent range 162-259   Postprandial readings:   Not available  VICTOZA: She has continued to take 1.2 mg daily without any side effects      Dinner is usually at 6-7 pm Bfst 10 am  Glucose monitoring:  done 1-2 times a day         Glucometer: One Touch.       Blood Glucose readings by time of day from download for the last 2 weeks:  As above, average 207 Previous reading   PRE-MEAL  mornings  midday Dinner Bedtime Overall  Glucose range:  107-125  117-172   127-294   Mean/median:  131    172  150     Self-care: The diet that the patient has been following is: none, usually eating low fat meals Meals: 2-3 meals per day          Dietician visit, most recent: never.  She saw the CDE in 02/2014               Weight history:  Wt Readings from Last 3 Encounters:  07/17/19 188 lb 6.4 oz (85.5 kg)  07/08/19 186 lb 9.6 oz (84.6 kg)  06/11/19 185 lb 6 oz (84.1 kg)    Glycemic control:   Lab Results  Component Value Date   HGBA1C 8.0 (H) 07/15/2019  HGBA1C 6.5 03/06/2019   HGBA1C 6.0 11/25/2018   Lab Results  Component Value Date   MICROALBUR 17.7 (H) 12/09/2018   LDLCALC 49 11/25/2018   CREATININE 1.48 (H) 07/15/2019     OTHER active problems  discussed in review of systems   Allergies as of 07/17/2019      Reactions   Codeine    REACTION: makes her nervous, orTylenol #3   Ibuprofen    REACTION: nervous   Meperidine Hcl    REACTION: nasuea and vomitting   Naproxen Sodium    REACTION: nervous      Medication List       Accurate as of July 17, 2019  1:29 PM. If you have any questions, ask your nurse or doctor.        alendronate 70 MG tablet Commonly known as: FOSAMAX Take 1 tablet (70 mg total) by mouth every 7 (seven) days. Take with a full glass of water on an empty stomach. Remain upright for at least 30 minutes after taking.   amLODipine 5 MG tablet Commonly known as: NORVASC TAKE 1 TABLET(5 MG) BY MOUTH DAILY   aspirin  81 MG chewable tablet Chew 1 tablet (81 mg total) by mouth 2 (two) times daily with a meal.   atenolol 100 MG tablet Commonly known as: TENORMIN Take 1 tablet (100 mg total) by mouth daily.   atorvastatin 20 MG tablet Commonly known as: LIPITOR TAKE 1 TABLET(20 MG) BY MOUTH DAILY   azelastine 0.1 % nasal spray Commonly known as: ASTELIN Place 2 sprays into both nostrils at bedtime as needed for rhinitis. Use in each nostril as directed   cholecalciferol 1000 units tablet Commonly known as: VITAMIN D Take 1,000 Units by mouth daily.   clopidogrel 75 MG tablet Commonly known as: PLAVIX Take 1 tablet (75 mg total) by mouth daily.   clorazepate 7.5 MG tablet Commonly known as: TRANXENE Take 1 tablet (7.5 mg total) by mouth as needed.   fluticasone 50 MCG/ACT nasal spray Commonly known as: FLONASE SHAKE LIQUID AND USE 2 SPRAYS IN EACH NOSTRIL DAILY What changed: See the new instructions.   insulin NPH Human 100 UNIT/ML injection Commonly known as: NOVOLIN N Inject 38 units into the skin every morning and inject 20 units into the skin at bedtime.   insulin regular 100 units/mL injection Commonly known as: NOVOLIN R Inject 14 Units into the skin 2 (two) times daily before a meal.   INSULIN SYRINGE .5CC/31GX5/16" 31G X 5/16" 0.5 ML Misc USE TO INJECT INSULIN 5 TIMES PER DAY   isosorbide mononitrate 30 MG 24 hr tablet Commonly known as: IMDUR TAKE 1 TABLET BY MOUTH EVERY DAY   mometasone 0.1 % cream Commonly known as: ELOCON Apply 1 application topically daily. Use as directed   multivitamin capsule Take 1 capsule by mouth daily.   nitroGLYCERIN 0.3 MG SL tablet Commonly known as: Nitrostat Place 1 tablet (0.3 mg total) under the tongue every 5 (five) minutes as needed for chest pain (ER if no better after 3 tablets).   omeprazole 20 MG capsule Commonly known as: PRILOSEC TAKE 1 CAPSULE(20 MG) BY MOUTH DAILY   ONE TOUCH ULTRA SYSTEM KIT w/Device Kit 1 kit by Does  not apply route once.   OneTouch Ultra test strip Generic drug: glucose blood Use Onetouch Ultra test strips as instructed to check blood sugar twice daily.   Victoza 18 MG/3ML Sopn Generic drug: liraglutide ADMINISTER 1.2 MG UNDER THE SKIN DAILY  Allergies:  Allergies  Allergen Reactions  . Codeine     REACTION: makes her nervous, orTylenol #3  . Ibuprofen     REACTION: nervous  . Meperidine Hcl     REACTION: nasuea and vomitting  . Naproxen Sodium     REACTION: nervous    Past Medical History:  Diagnosis Date  . Acute blood loss anemia 09/17/2015  . Allergy   . Anemia   . Anginal pain ([place]) 2017  . Anxiety   . Arthritis    "hands and knees mostly" (09/16/2015)  . B12 deficiency anemia   . CAD (coronary artery disease)    Dr Gwenlyn Found  . Cataract    bil cataracts removed  . 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 Clotting disorder ([place])   . Depression   . DJD (degenerative joint disease)   . 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 Gastritis   . GERD (gastroesophageal reflux disease)   . Headache(784.0)   . History of blood transfusion    "I"ve had 20 some; thru birth of children, loss of blood, last 2 were in ~ 1999 before my cancer surgery" (09/16/2015)  . History of hiatal hernia   . History of shingles   . Hx of colonic polyps   . Hyperlipidemia   . Hypertension   . 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 Myocardial infarction ([place])    1997  . Pneumonia    X 1  . Postoperative hematoma involving circulatory system following cardiac catheterization 09/17/2015  . S/P cardiac cath 09/16/15 09/17/2015  . Type II diabetes mellitus ([place])   . 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 Venous insufficiency     Past Surgical History:  Procedure Laterality Date  . CARDIAC CATHETERIZATION  1997; 2008; 09/16/2015  . CARDIAC CATHETERIZATION N/A 09/16/2015   Procedure:  Right/Left Heart Cath and Coronary/Graft Angiography;  Surgeon: Lorretta Harp, MD;  Location: [facility];  Service: Cardiovascular;  Laterality: N/A;  . CESAREAN SECTION  1966  . COLONOSCOPY    . CORONARY ARTERY BYPASS GRAFT  1997   CABG X5  . EYE SURGERY     2 laser surgeries on left eye with implant  . EYE SURGERY Bilateral    cataracts  . FEMUR IM NAIL Left 08/28/2018   Procedure: INTRAMEDULLARY (IM) NAIL FEMORAL;  Surgeon: Rod Can, MD;  Location: [facility];  Service: Orthopedics;  Laterality: Left;  . FRACTURE SURGERY    . HERNIA REPAIR    . LAPAROSCOPIC ASSISTED VENTRAL HERNIA REPAIR  09/2008    with incarcerated colon;  Dr. Lucia Gaskins  . MOHS SURGERY  06/2016   [facility]  . PATELLA FRACTURE SURGERY Left 11/1994   "crushed my knee"; put in a plate & 6 screws"  . POLYPECTOMY    . REFRACTIVE SURGERY Left 07/30/2015  . 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 March Rummage  . SHOULDER ARTHROSCOPY W/ ROTATOR CUFF REPAIR Right 1997  . SMALL INTESTINE SURGERY    . TONSILLECTOMY  1960s  . TOTAL KNEE ARTHROPLASTY WITH HARDWARE REMOVAL Left 12/1994    (infex, hardware removed )  . TUBAL LIGATION  1966    Family History  Problem Relation Age of Onset  . Heart disease Mother 39  . Heart disease Father 55       MI  . Hypertension Child   . Heart attack Child   .  Diabetes Child   . Heart disease Maternal Aunt        x 2, all deceased  . Heart disease Maternal Uncle        x 4, all deceased  . Emphysema Brother 5  . Colon cancer Neg Hx   . Breast cancer Neg Hx   . Rectal cancer Neg Hx   . Stomach cancer Neg Hx     Social History:  reports that she has never smoked. She has never used smokeless tobacco. She reports that she does not drink alcohol and does not use drugs.    Review of Systems         Lipids: Has had excellent control of hypercholesterolemia with long-term use of Lipitor She has a history of coronary bypass surgery. Not on niacin currently HDL  37  Last labs as follows:       Lab Results  Component Value Date   CHOL 116 11/25/2018   HDL 36.90 (L) 11/25/2018   LDLCALC 49 11/25/2018   LDLDIRECT 36.0 05/26/2016   TRIG 150.0 (H) 11/25/2018   CHOLHDL 3 11/25/2018                  The blood pressure has been treated with atenolol 100 mg by her PCP and also 5 mg amlodipine prescribed here   She does have a high microalbumin level in the past Normal in 12/20    BP Readings from Last 3 Encounters:  07/17/19 140/62  07/08/19 (!) 157/67  06/11/19 (!) 163/60     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 creatinine has been fluctuating    Lab Results  Component Value Date   CREATININE 1.48 (H) 07/15/2019   CREATININE 1.69 (H) 07/08/2019   CREATININE 1.22 (H) 05/12/2019       No history of Numbness, tingling or burning in feet   Diabetic foot exam was in 5/19 showing mild neuropathy  History of CLL: Under the care of hematologist, labs as follows  Lab Results  Component Value Date   WBC 65.0 (HH) 07/08/2019   Lab Results  Component Value Date   HGB 11.5 (L) 07/08/2019       Physical Examination:  BP 140/62 (BP Location: Left Arm, Patient Position: Sitting, Cuff Size: Large)   Pulse (!) 58   Ht 5' 6"  (1.676 m)   Wt 188 lb 6.4 oz (85.5 kg)   SpO2 94%   BMI 30.41 kg/m     Diabetes type 2, insulin requiring with BMI 30   See history of present illness for detailed discussion of her current blood sugar patterns, management and problems identified  Her A1c is much higher at 8%  Difficult to manage her diabetes currently because of lack of glucose monitoring and not clear why her blood sugars are overall higher She has not left off her Victoza although not able to take 1.8 mg because of cost  RENAL insufficiency: Her creatinine is variable  HYPERTENSION: Blood pressure is well controlled as of today     PLAN:   She will need to add Metformin lower doses, currently creatinine is improving and adequate for at least 500  mg daily which will help her morning sugars  Increase Novolin to 24 units at bedtime  She will need to start checking blood sugars at least once a day after one of her meals to help adjust her mealtime dose  To try and take mealtime dose 30 minutes before eating  No change in blood pressure medications  She will call if she has persistently high readings, discussed fasting and postprandial targets  Avoid high carbohydrate meals and snacks  If she is forgetting her NovoLog at bedtime she can take it at suppertime  Will sign up for patient assistance program and if she can qualify she may be able to take Victoza, [place] and NovoLog from the same program; paperwork was given to her today and will be completed and faxed  We will need short-term follow-up  Patient Instructions  Novolin N 24 at bedtime to keep am sugars in 100-150 range  Metformin 1 at dinner   Victoza at supper ??  Check blood sugars on waking up 3-4 days a week  Also check blood sugars about 2 hours after meals and do this after different meals by rotation  Recommended blood sugar levels on waking up are 90-130 and about 2 hours after meal is 130-180  Please bring your blood sugar monitor to each visit, thank you      Elayne Snare 07/17/2019, 1:29 PM   Note: This office note was prepared with Dragon voice recognition system technology. Any transcriptional errors that result from this process are unintentional.

## 2019-07-17 NOTE — Patient Instructions (Addendum)
Novolin N 24 at bedtime to keep am sugars in 100-150 range  Metformin 1 at dinner   Victoza at supper ??  Check blood sugars on waking up 3-4 days a week  Also check blood sugars about 2 hours after meals and do this after different meals by rotation  Recommended blood sugar levels on waking up are 90-130 and about 2 hours after meal is 130-180  Please bring your blood sugar monitor to each visit, thank you

## 2019-07-21 ENCOUNTER — Other Ambulatory Visit: Payer: Self-pay | Admitting: Internal Medicine

## 2019-07-22 ENCOUNTER — Ambulatory Visit: Payer: Medicare Other | Admitting: Cardiovascular Disease

## 2019-07-31 ENCOUNTER — Other Ambulatory Visit: Payer: Self-pay | Admitting: Internal Medicine

## 2019-08-07 ENCOUNTER — Telehealth: Payer: Self-pay

## 2019-08-07 NOTE — Telephone Encounter (Signed)
PATIENT ASSISTANCE PROGRAM  PROVIDER SECTION OF APPLICATION Company: [employer]  Medication(s) ordered: Victoza 1.2mg , Novolog U100 and Tresiba U100 Document: Rx  Rx has been faxed successfully to [employer] listed above. Documents and fax confirmation have been placed in the faxed file for future reference.

## 2019-08-25 ENCOUNTER — Encounter: Payer: Self-pay | Admitting: Endocrinology

## 2019-08-25 ENCOUNTER — Ambulatory Visit (INDEPENDENT_AMBULATORY_CARE_PROVIDER_SITE_OTHER): Payer: Medicare Other | Admitting: Endocrinology

## 2019-08-25 ENCOUNTER — Other Ambulatory Visit: Payer: Self-pay

## 2019-08-25 VITALS — BP 130/72 | HR 67 | Ht 66.0 in | Wt 186.6 lb

## 2019-08-25 DIAGNOSIS — I25810 Atherosclerosis of coronary artery bypass graft(s) without angina pectoris: Secondary | ICD-10-CM | POA: Diagnosis not present

## 2019-08-25 DIAGNOSIS — E1165 Type 2 diabetes mellitus with hyperglycemia: Secondary | ICD-10-CM

## 2019-08-25 DIAGNOSIS — Z794 Long term (current) use of insulin: Secondary | ICD-10-CM | POA: Diagnosis not present

## 2019-08-25 NOTE — Patient Instructions (Signed)
Check blood sugars on waking up days a week  Also check blood sugars about 2 hours after meals and do this after different meals by rotation  Recommended blood sugar levels on waking up are 90-130 and about 2 hours after meal is 130-160  Please bring your blood sugar monitor to each visit, thank you   

## 2019-08-25 NOTE — Progress Notes (Signed)
Patient ID: [name], female   DOB: [birth year], 79 y.o.   MRN: [id number]            Reason for Appointment: Followup for Type 2 Diabetes   History of Present Illness:          Diagnosis: Type 2 diabetes mellitus, date of diagnosis: 1998       Past history:   She was treated with metformin at diagnosis when this had been continued until 2015 Subsequently Amaryl was also added several years ago and this has been continued Her blood sugars were under fair control between 2010 and early 2013 with A1c ranging from 7.4-9.4, mostly under 8% However since 08/2011 her A1c has been mostly over 8%  Insulin was added in 2014 with sma limited bottom ll doses of Lantus and this has been progressively increased She was started on mealtime insulin on her initial consultation in 9/15 because of high postprandial readings, sometimes over 300 With adding Victoza in 02/2014 her blood sugars were somewhat better with A1c coming down below 8%  Recent history:   INSULIN regimen: Regular 14 units before meals twice daily.  NPH 38 units in the morning and 24 hs  Non-insulin hypoglycemic drugs the patient is taking are: Victoza 1.2 mg daily.    Current blood sugar patterns, daily management and problems identified:   Her A1c is as of July higher than usual at 8%  With increasing her bedtime NPH by 4 units and adding 500 mg Metformin in the evening her blood sugars are significantly better  However she is somewhat uncertain about how much insulin she is taking at bedtime  She only checks blood sugars in the mornings and bedtime but blood sugars are fairly good at both times with overall average only about 140  Has not been exercised much because of physical limitations  She is generally controlling her portions and carbohydrates  Weight is down 2 pounds despite increasing her insulin  Fasting blood sugars: Blood sugars are relatively better with only rare high readings, done at somewhat  inconsistent times   VICTOZA: She has continued to take 1.2 mg daily without any side effects      Dinner is usually at 6-7 pm Bfst 10 am  Glucose monitoring:  done 1-2 times a day         Glucometer: One Touch.       Blood Glucose readings by time of day from download for the last 2 weeks:   PRE-MEAL Fasting Lunch Dinner Bedtime Overall  Glucose range:  77-176  110, 151   125-224   Mean/median:  130   156 141   POST-MEAL PC Breakfast PC Lunch PC Dinner  Glucose range:   ?  Mean/median:        Self-care: The diet that the patient has been following is: none, usually eating low fat meals Meals: 2-3 meals per day          Dietician visit, most recent: never.  She saw the CDE in 02/2014               Weight history:  Wt Readings from Last 3 Encounters:  08/25/19 186 lb 9.6 oz (84.6 kg)  07/17/19 188 lb 6.4 oz (85.5 kg)  07/08/19 186 lb 9.6 oz (84.6 kg)    Glycemic control:   Lab Results  Component Value Date   HGBA1C 8.0 (H) 07/15/2019   HGBA1C 6.5 03/06/2019   HGBA1C 6.0 11/25/2018   Lab  Results  Component Value Date   MICROALBUR 17.7 (H) 12/09/2018   LDLCALC 49 11/25/2018   CREATININE 1.48 (H) 07/15/2019     OTHER active problems  discussed in review of systems   Allergies as of 08/25/2019      Reactions   Codeine    REACTION: makes her nervous, orTylenol #3   Ibuprofen    REACTION: nervous   Meperidine Hcl    REACTION: nasuea and vomitting   Naproxen Sodium    REACTION: nervous      Medication List       Accurate as of August 25, 2019 11:59 PM. If you have any questions, ask your nurse or doctor.        alendronate 70 MG tablet Commonly known as: FOSAMAX Take 1 tablet (70 mg total) by mouth every 7 (seven) days. Take with a full glass of water on an empty stomach. Remain upright for at least 30 minutes after taking.   amLODipine 5 MG tablet Commonly known as: NORVASC TAKE 1 TABLET(5 MG) BY MOUTH DAILY   aspirin 81 MG chewable  tablet Chew 1 tablet (81 mg total) by mouth 2 (two) times daily with a meal.   atenolol 100 MG tablet Commonly known as: TENORMIN Take 1 tablet (100 mg total) by mouth daily.   atorvastatin 20 MG tablet Commonly known as: LIPITOR TAKE 1 TABLET(20 MG) BY MOUTH DAILY   azelastine 0.1 % nasal spray Commonly known as: ASTELIN Place 2 sprays into both nostrils at bedtime as needed for rhinitis. Use in each nostril as directed   cholecalciferol 1000 units tablet Commonly known as: VITAMIN D Take 1,000 Units by mouth daily.   clopidogrel 75 MG tablet Commonly known as: PLAVIX Take 1 tablet (75 mg total) by mouth daily.   clorazepate 7.5 MG tablet Commonly known as: TRANXENE Take 1 tablet (7.5 mg total) by mouth as needed.   fluticasone 50 MCG/ACT nasal spray Commonly known as: FLONASE SHAKE LIQUID AND USE 2 SPRAYS IN EACH NOSTRIL DAILY What changed: See the new instructions.   insulin NPH Human 100 UNIT/ML injection Commonly known as: NOVOLIN N Inject 38 units into the skin every morning and inject 20 units into the skin at bedtime.   insulin regular 100 units/mL injection Commonly known as: NOVOLIN R Inject 14 Units into the skin 2 (two) times daily before a meal.   INSULIN SYRINGE .5CC/31GX5/16" 31G X 5/16" 0.5 ML Misc USE TO INJECT INSULIN 5 TIMES PER DAY   isosorbide mononitrate 30 MG 24 hr tablet Commonly known as: IMDUR TAKE 1 TABLET BY MOUTH EVERY DAY   metFORMIN 500 MG tablet Commonly known as: GLUCOPHAGE Take 1 tablet (500 mg total) by mouth daily with supper.   mometasone 0.1 % cream Commonly known as: ELOCON Apply 1 application topically daily. Use as directed   multivitamin capsule Take 1 capsule by mouth daily.   nitroGLYCERIN 0.3 MG SL tablet Commonly known as: Nitrostat Place 1 tablet (0.3 mg total) under the tongue every 5 (five) minutes as needed for chest pain (ER if no better after 3 tablets).   omeprazole 20 MG capsule Commonly known as:  PRILOSEC TAKE 1 CAPSULE(20 MG) BY MOUTH DAILY   ONE TOUCH ULTRA SYSTEM KIT w/Device Kit 1 kit by Does not apply route once.   OneTouch Ultra test strip Generic drug: glucose blood Use Onetouch Ultra test strips as instructed to check blood sugar twice daily.   Victoza 18 MG/3ML Sopn Generic drug: liraglutide ADMINISTER  1.2 MG UNDER THE SKIN DAILY       Allergies:  Allergies  Allergen Reactions  . Codeine     REACTION: makes her nervous, orTylenol #3  . Ibuprofen     REACTION: nervous  . Meperidine Hcl     REACTION: nasuea and vomitting  . Naproxen Sodium     REACTION: nervous    Past Medical History:  Diagnosis Date  . Acute blood loss anemia 09/17/2015  . Allergy   . Anemia   . Anginal pain ([place]) 2017  . Anxiety   . Arthritis    "hands and knees mostly" (09/16/2015)  . B12 deficiency anemia   . CAD (coronary artery disease)    Dr Gwenlyn Found  . Cataract    bil cataracts removed  . 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 Clotting disorder ([place])   . Depression   . DJD (degenerative joint disease)   . 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 Gastritis   . GERD (gastroesophageal reflux disease)   . Headache(784.0)   . History of blood transfusion    "I"ve had 20 some; thru birth of children, loss of blood, last 2 were in ~ 1999 before my cancer surgery" (09/16/2015)  . History of hiatal hernia   . History of shingles   . Hx of colonic polyps   . Hyperlipidemia   . Hypertension   . 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 Myocardial infarction ([place])    1997  . Pneumonia    X 1  . Postoperative hematoma involving circulatory system following cardiac catheterization 09/17/2015  . S/P cardiac cath 09/16/15 09/17/2015  . Type II diabetes mellitus ([place])   . 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 Venous insufficiency     Past Surgical History:  Procedure Laterality Date  .  CARDIAC CATHETERIZATION  1997; 2008; 09/16/2015  . CARDIAC CATHETERIZATION N/A 09/16/2015   Procedure: Right/Left Heart Cath and Coronary/Graft Angiography;  Surgeon: Lorretta Harp, MD;  Location: [facility];  Service: Cardiovascular;  Laterality: N/A;  . CESAREAN SECTION  1966  . COLONOSCOPY    . CORONARY ARTERY BYPASS GRAFT  1997   CABG X5  . EYE SURGERY     2 laser surgeries on left eye with implant  . EYE SURGERY Bilateral    cataracts  . FEMUR IM NAIL Left 08/28/2018   Procedure: INTRAMEDULLARY (IM) NAIL FEMORAL;  Surgeon: Rod Can, MD;  Location: [facility];  Service: Orthopedics;  Laterality: Left;  . FRACTURE SURGERY    . HERNIA REPAIR    . LAPAROSCOPIC ASSISTED VENTRAL HERNIA REPAIR  09/2008    with incarcerated colon;  Dr. Lucia Gaskins  . MOHS SURGERY  06/2016   BCC  . PATELLA FRACTURE SURGERY Left 11/1994   "crushed my knee"; put in a plate & 6 screws"  . POLYPECTOMY    . REFRACTIVE SURGERY Left 07/30/2015  . 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 March Rummage  . SHOULDER ARTHROSCOPY W/ ROTATOR CUFF REPAIR Right 1997  . SMALL INTESTINE SURGERY    . TONSILLECTOMY  1960s  . TOTAL KNEE ARTHROPLASTY WITH HARDWARE REMOVAL Left 12/1994    (infex, hardware removed )  . TUBAL LIGATION  1966    Family History  Problem Relation Age of Onset  . Heart disease Mother 59  . Heart disease Father 66  MI  . Hypertension Child   . Heart attack Child   . Diabetes Child   . Heart disease Maternal Aunt        x 2, all deceased  . Heart disease Maternal Uncle        x 4, all deceased  . Emphysema Brother 11  . Colon cancer Neg Hx   . Breast cancer Neg Hx   . Rectal cancer Neg Hx   . Stomach cancer Neg Hx     Social History:  reports that she has never smoked. She has never used smokeless tobacco. She reports that she does not drink alcohol and does not use drugs.    Review of Systems         Lipids: Has had excellent control of hypercholesterolemia with  long-term use of Lipitor She has a history of coronary bypass surgery. Not on niacin  HDL 37  Last labs as follows:       Lab Results  Component Value Date   CHOL 116 11/25/2018   HDL 36.90 (L) 11/25/2018   LDLCALC 49 11/25/2018   LDLDIRECT 36.0 05/26/2016   TRIG 150.0 (H) 11/25/2018   CHOLHDL 3 11/25/2018                  The blood pressure has been treated with atenolol 100 mg by her PCP and also 5 mg amlodipine prescribed here     BP Readings from Last 3 Encounters:  08/25/19 130/72  07/17/19 140/62  07/08/19 (!) 157/67     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 creatinine has been fluctuating Etiology unclear and discussed with patient she may have glomerulosclerosis also  She does have a high microalbumin level in the past Normal in 12/20     Lab Results  Component Value Date   CREATININE 1.48 (H) 07/15/2019   CREATININE 1.69 (H) 07/08/2019   CREATININE 1.22 (H) 05/12/2019       No history of Numbness, tingling or burning in feet   Diabetic foot exam was in 5/19 showing mild neuropathy  History of CLL: Under the care of hematologist, labs as follows  Lab Results  Component Value Date   WBC 65.0 ([place]) 07/08/2019   Lab Results  Component Value Date   HGB 11.5 (L) 07/08/2019       Physical Examination:  BP 130/72 (BP Location: Left Arm, Patient Position: Sitting, Cuff Size: Normal)   Pulse 67   Ht _0  (1.676 m)   Wt 186 lb 9.6 oz (84.6 kg)   BMI 30.12 kg/m     Diabetes type 2, insulin requiring    See history of present illness for detailed discussion of her current blood sugar patterns, management and problems identified  Her A1c is last higher at 8%  Blood sugars are now improving She has started checking her sugars and likely has taken her increased dose of insulin as directed along with adding 500 mg of Metformin Also likely benefiting from [place] She has fairly good blood sugars both morning and nighttime although needs to check some readings after  breakfast also  RENAL insufficiency: Her creatinine is variable  HYPERTENSION: Blood pressure is well controlled      PLAN:   She will continue the same regimen  Discussed when to check the blood sugar and target readings  Follow-up with PCP regarding repeat renal functions at upcoming visit  Patient Instructions  Check blood sugars on waking up days a week  Also check blood sugars about 2  hours after meals and do this after different meals by rotation  Recommended blood sugar levels on waking up are 90-130 and about 2 hours after meal is 130-160  Please bring your blood sugar monitor to each visit, thank you      Elayne Snare 08/27/2019, 8:40 AM   Note: This office note was prepared with Dragon voice recognition system technology. Any transcriptional errors that result from this process are unintentional.

## 2019-08-27 ENCOUNTER — Other Ambulatory Visit: Payer: Self-pay | Admitting: Internal Medicine

## 2019-08-28 ENCOUNTER — Telehealth: Payer: Self-pay | Admitting: Internal Medicine

## 2019-08-28 ENCOUNTER — Other Ambulatory Visit: Payer: Self-pay | Admitting: *Deleted

## 2019-08-28 ENCOUNTER — Telehealth: Payer: Self-pay | Admitting: Endocrinology

## 2019-08-28 MED ORDER — "INSULIN SYRINGE 31G X 5/16"" 0.5 ML MISC": 0 refills | Status: DC

## 2019-08-28 NOTE — Telephone Encounter (Signed)
Refill sent.

## 2019-08-28 NOTE — Telephone Encounter (Signed)
Medication RX Request  Did you call your pharmacy and request this refill first? Yes-RX was not at [facility] . If patient has not contacted pharmacy first, instruct them to do so for future refills.  . Remind them that contacting the pharmacy for their refill is the quickest method to get the refill.  . Refill policy also stated that it will take anywhere between 24-72 hours to receive the refill.    Name of medication?  Insulin Syringe-Needle U-100 (INSULIN SYRINGE .5CC/31GX5/16") 31G X 5/16" 0.5 ML MISC  Is this a 90 day supply? Not sure  Name and location of pharmacy?   [facility] [place], [address] Phone:  [phone]  Fax:  [phone]      . Is the request for diabetes test strips? No . If yes, what brand? N/A

## 2019-08-28 NOTE — Progress Notes (Signed)
  Chronic Care Management   Note  08/28/2019 Name: [name] MRN: [id number] DOB: 1940/09/09  [name] is a 79 y.o. year old female who is a primary care patient of Paz, Alda Berthold, MD. I reached out to [employer] by phone today in response to a referral sent by Ms. Sara Moulding Yett's PCP, Colon Branch, MD.   [name] was given information about Chronic Care Management services today including:  1. CCM service includes personalized support from designated clinical staff supervised by her physician, including individualized plan of care and coordination with other care providers 2. 24/7 contact phone numbers for assistance for urgent and routine care needs. 3. Service will only be billed when office clinical staff spend 20 minutes or more in a month to coordinate care. 4. Only one practitioner may furnish and bill the service in a calendar month. 5. The patient may stop CCM services at any time (effective at the end of the month) by phone call to the office staff.   Patient agreed to services and verbal consent obtained.   Follow up plan:   Carley Perdue [employer] Scheduler

## 2019-09-02 ENCOUNTER — Other Ambulatory Visit: Payer: Self-pay | Admitting: Endocrinology

## 2019-09-12 ENCOUNTER — Other Ambulatory Visit: Payer: Self-pay

## 2019-09-12 ENCOUNTER — Ambulatory Visit (INDEPENDENT_AMBULATORY_CARE_PROVIDER_SITE_OTHER): Payer: Medicare Other | Admitting: Internal Medicine

## 2019-09-12 ENCOUNTER — Encounter: Payer: Self-pay | Admitting: Internal Medicine

## 2019-09-12 VITALS — BP 141/68 | HR 71 | Temp 97.7°F | Resp 18 | Ht 66.0 in | Wt 186.1 lb

## 2019-09-12 DIAGNOSIS — I25810 Atherosclerosis of coronary artery bypass graft(s) without angina pectoris: Secondary | ICD-10-CM

## 2019-09-12 DIAGNOSIS — N189 Chronic kidney disease, unspecified: Secondary | ICD-10-CM

## 2019-09-12 DIAGNOSIS — M810 Age-related osteoporosis without current pathological fracture: Secondary | ICD-10-CM | POA: Diagnosis not present

## 2019-09-12 NOTE — Patient Instructions (Addendum)
Happy belated [name]!  Per our records you are due for an eye exam. Please contact your eye doctor to schedule an appointment. Please have them send copies of your office visit notes to [place]. Our fax number is [phone].  To protect your kidneys, be sure you drink plenty of water  Avoid anti-inflammatory such as ibuprofen or naproxen.  Tylenol is okay  Be sure your blood pressure is not too high or too low  Blood pressure GOAL is between 110/65 and  135/85. If it is consistently higher or lower, let me know  [place], [place] back for a checkup in 3 months  STOP BY THE FIRST FLOOR: Schedule your kidney ultrasound

## 2019-09-12 NOTE — Progress Notes (Signed)
Subjective:    Patient ID: [name], female    DOB: 02/01/1940, 79 y.o.   MRN: [id number]  DOS:  09/12/2019 Type of visit - description: Acute Patient is concerned about her kidney function, last creatinine increased. She  would like to stop Fosamax for the same reason. She takes medications regularly. Drinks plenty fluids Does not take NSAIDs No recent ambulatory BPs No fever chills    Review of Systems See above   Past Medical History:  Diagnosis Date  . Acute blood loss anemia 09/17/2015  . Allergy   . Anemia   . Anginal pain ([place]) 2017  . Anxiety   . Arthritis    "hands and knees mostly" (09/16/2015)  . B12 deficiency anemia   . CAD (coronary artery disease)    Dr Gwenlyn Found  . Cataract    bil cataracts removed  . CLL (chronic lymphocytic leukemia) ([place]) 02/25/2014  . Clotting disorder ([place])   . Depression   . DJD (degenerative joint disease)   . 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 Gastritis   . GERD (gastroesophageal reflux disease)   . Headache(784.0)   . History of blood transfusion    "I"ve had 20 some; thru birth of children, loss of blood, last 2 were in ~ 1999 before my cancer surgery" (09/16/2015)  . History of hiatal hernia   . History of shingles   . Hx of colonic polyps   . Hyperlipidemia   . Hypertension   . 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 Myocardial infarction ([place])    1997  . Pneumonia    X 1  . Postoperative hematoma involving circulatory system following cardiac catheterization 09/17/2015  . S/P cardiac cath 09/16/15 09/17/2015  . Type II diabetes mellitus ([place])   . 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[facility])    as a child  . Venous insufficiency     Past Surgical History:  Procedure Laterality Date  . CARDIAC CATHETERIZATION  1997; 2008; 09/16/2015  . CARDIAC CATHETERIZATION N/A 09/16/2015   Procedure: Right/Left Heart Cath and Coronary/Graft Angiography;  Surgeon:  Lorretta Harp, MD;  Location: [facility];  Service: Cardiovascular;  Laterality: N/A;  . CESAREAN SECTION  1966  . COLONOSCOPY    . CORONARY ARTERY BYPASS GRAFT  1997   CABG X5  . EYE SURGERY     2 laser surgeries on left eye with implant  . EYE SURGERY Bilateral    cataracts  . FEMUR IM NAIL Left 08/28/2018   Procedure: INTRAMEDULLARY (IM) NAIL FEMORAL;  Surgeon: Rod Can, MD;  Location: [facility];  Service: Orthopedics;  Laterality: Left;  . FRACTURE SURGERY    . HERNIA REPAIR    . LAPAROSCOPIC ASSISTED VENTRAL HERNIA REPAIR  09/2008    with incarcerated colon;  Dr. Lucia Gaskins  . MOHS SURGERY  06/2016   BCC  . PATELLA FRACTURE SURGERY Left 11/1994   "crushed my knee"; put in a plate & 6 screws"  . POLYPECTOMY    . REFRACTIVE SURGERY Left 07/30/2015  . 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 March Rummage  . SHOULDER ARTHROSCOPY W/ ROTATOR CUFF REPAIR Right 1997  . SMALL INTESTINE SURGERY    . TONSILLECTOMY  1960s  . TOTAL KNEE ARTHROPLASTY WITH HARDWARE REMOVAL Left 12/1994    (infex, hardware removed )  . TUBAL LIGATION  1966    Allergies as of 09/12/2019  Reactions   Codeine    REACTION: makes her nervous, orTylenol #3   Ibuprofen    REACTION: nervous   Meperidine Hcl    REACTION: nasuea and vomitting   Naproxen Sodium    REACTION: nervous      Medication List       Accurate as of September 12, 2019 11:12 AM. If you have any questions, ask your nurse or doctor.        alendronate 70 MG tablet Commonly known as: FOSAMAX TAKE 1 TABLET BY MOUTH EVERY 7 DAYS WITH A FULL GLASS OF WATER ON AN EMPTY STOMACH. REMAIN UPRIGHT FOR AT LEAST 30 MINUTES AFTER   amLODipine 5 MG tablet Commonly known as: NORVASC TAKE 1 TABLET(5 MG) BY MOUTH DAILY   aspirin 81 MG chewable tablet Chew 1 tablet (81 mg total) by mouth 2 (two) times daily with a meal.   atenolol 100 MG tablet Commonly known as: TENORMIN Take 1 tablet (100 mg total) by mouth daily.    atorvastatin 20 MG tablet Commonly known as: LIPITOR TAKE 1 TABLET(20 MG) BY MOUTH DAILY   azelastine 0.1 % nasal spray Commonly known as: ASTELIN Place 2 sprays into both nostrils at bedtime as needed for rhinitis. Use in each nostril as directed   cholecalciferol 1000 units tablet Commonly known as: VITAMIN D Take 1,000 Units by mouth daily.   clopidogrel 75 MG tablet Commonly known as: PLAVIX Take 1 tablet (75 mg total) by mouth daily.   clorazepate 7.5 MG tablet Commonly known as: TRANXENE Take 1 tablet (7.5 mg total) by mouth as needed.   fluticasone 50 MCG/ACT nasal spray Commonly known as: FLONASE SHAKE LIQUID AND USE 2 SPRAYS IN EACH NOSTRIL DAILY What changed: See the new instructions.   insulin NPH Human 100 UNIT/ML injection Commonly known as: NOVOLIN N Inject 38 units into the skin every morning and inject 20 units into the skin at bedtime.   insulin regular 100 units/mL injection Commonly known as: NOVOLIN R Inject 14 Units into the skin 2 (two) times daily before a meal.   INSULIN SYRINGE .5CC/31GX5/16" 31G X 5/16" 0.5 ML Misc USE TO INJECT INSULIN 5 TIMES PER DAY   isosorbide mononitrate 30 MG 24 hr tablet Commonly known as: IMDUR TAKE 1 TABLET BY MOUTH EVERY DAY   metFORMIN 500 MG tablet Commonly known as: GLUCOPHAGE Take 1 tablet (500 mg total) by mouth daily with supper.   mometasone 0.1 % cream Commonly known as: ELOCON Apply 1 application topically daily. Use as directed   multivitamin capsule Take 1 capsule by mouth daily.   nitroGLYCERIN 0.3 MG SL tablet Commonly known as: Nitrostat Place 1 tablet (0.3 mg total) under the tongue every 5 (five) minutes as needed for chest pain (ER if no better after 3 tablets).   omeprazole 20 MG capsule Commonly known as: PRILOSEC TAKE 1 CAPSULE(20 MG) BY MOUTH DAILY   ONE TOUCH ULTRA SYSTEM KIT w/Device Kit 1 kit by Does not apply route once.   OneTouch Ultra test strip Generic drug: glucose  blood Use Onetouch Ultra test strips as instructed to check blood sugar twice daily.   Victoza 18 MG/3ML Sopn Generic drug: liraglutide ADMINISTER 1.2 MG UNDER THE SKIN DAILY          Objective:   Physical Exam BP (!) 141/68 (BP Location: Left Arm, Patient Position: Sitting, Cuff Size: Normal)   Pulse 71   Temp 97.7 F (36.5 C) (Oral)   Resp 18   Ht  _0  (1.676 m)   Wt 186 lb 2 oz (84.4 kg)   SpO2 98%   BMI 30.04 kg/m  General:   Well developed, NAD, BMI noted. HEENT:  Normocephalic . Face symmetric, atraumatic  Lower extremities: no pretibial edema bilaterally  Skin: Not pale. Not jaundice Neurologic:  alert & oriented X3.  Speech normal, gait appropriate for age and unassisted Psych--  Cognition and judgment appear intact.  Cooperative with normal attention span and concentration.  Behavior appropriate. No anxious or depressed appearing.      Assessment     Assessment DM  Dr Dwyane Dee  +retinopathy  HTN Hyperlipidemia CRI creat ~1.4  (GFR ~ 38) Depression/anxiety: tranxene qhs prn (takes rarely) MSK: -DJD: hydrocodone (rx pcp) - T score -0.8 on December 2017, h/o a foot FX d/t walking years ago (likely a stress fracture), discussed treatment 04/2016:  exercise and cont Vit d Hem/Onc: Dr Marin Olp  -CLL -iron deficiency anemia: IV iron 2018 -B12 def CAD, Dr Gwenlyn Found MI 97 >> CABG, cath 12-13-2006, myoview 2012 no ischemic CP: Stress test 08/05/2015, indeterminate risk study, + lateral ischemia: Cardiac catheterization 09/16/2015: Rx medical Venous insuff GI:  Dr Silverio Decamp ---GERD, IBS, h/o Gastritis ---H/o ulcerative colitis as a child H/o shingles  BCC Dr Allyson Sabal, bx 04-2015, MOH's 06-2016   PLAN Chronic renal insufficiency: Endocrine is concerned about the issue, chart is reviewed, had a CT of the abdomen 10/2016, she had tiny renal cysts.  Creatinine varies between 1.30-1 70, last creatinine 1.48 (clearance 40). Given history of DM, CAD, high cholesterol,  hypertension suspect nephrosclerosis. Plan: Renal ultrasound Good hydration, avoid NSAIDs, avoid too low or too high BPs. Used to be on a Avapro, it was stopped during admission 08/2018 due to the temporary worsening of kidney function.  Consider restart ARBs Osteoporosis:  patient quit Fosamax d/t concerns about low kidney function, although she could continue Fosamax w/ a creat clearance of 40 (limit 35) , we agreed at the end to switch to Prolia. RTC 3 months  This visit occurred during the SARS-CoV-2 public health emergency.  Safety protocols were in place, including screening questions prior to the visit, additional usage of staff PPE, and extensive cleaning of exam room while observing appropriate contact time as indicated for disinfecting solutions.

## 2019-09-12 NOTE — Progress Notes (Signed)
Pre visit review using our clinic review tool, if applicable. No additional management support is needed unless otherwise documented below in the visit note. 

## 2019-09-14 DIAGNOSIS — M810 Age-related osteoporosis without current pathological fracture: Secondary | ICD-10-CM | POA: Insufficient documentation

## 2019-09-14 NOTE — Assessment & Plan Note (Signed)
Chronic renal insufficiency: Endocrine is concerned about the issue, chart is reviewed, had a CT of the abdomen 10/2016, she had tiny renal cysts.  Creatinine varies between 1.30-1 70, last creatinine 1.48 (clearance 40). Given history of DM, CAD, high cholesterol, hypertension suspect nephrosclerosis. Plan: Renal ultrasound Good hydration, avoid NSAIDs, avoid too low or too high BPs. Used to be on a Avapro, it was stopped during admission 08/2018 due to the temporary worsening of kidney function.  Consider restart ARBs Osteoporosis:  patient quit Fosamax d/t concerns about low kidney function, although she could continue Fosamax w/ a creat clearance of 40 (limit 35) , we agreed at the end to switch to Prolia. RTC 3 months

## 2019-09-18 ENCOUNTER — Other Ambulatory Visit: Payer: Self-pay

## 2019-09-18 ENCOUNTER — Ambulatory Visit (HOSPITAL_BASED_OUTPATIENT_CLINIC_OR_DEPARTMENT_OTHER)
Admission: RE | Admit: 2019-09-18 | Discharge: 2019-09-18 | Disposition: A | Discharge status: Home / Self Care | Payer: Medicare Other | Source: Ambulatory Visit | Attending: Internal Medicine | Admitting: Internal Medicine

## 2019-09-18 DIAGNOSIS — N189 Chronic kidney disease, unspecified: Secondary | ICD-10-CM | POA: Diagnosis not present

## 2019-09-18 DIAGNOSIS — N281 Cyst of kidney, acquired: Secondary | ICD-10-CM | POA: Diagnosis not present

## 2019-09-27 IMAGING — CR DG CHEST 2V
2 series · 2 of 2 positions shown · non-contrast
Comparison: Chest CT 10/18/2016

CLINICAL DATA: Cough and fever

EXAM:
CHEST - 2 VIEW

[w chest pa]
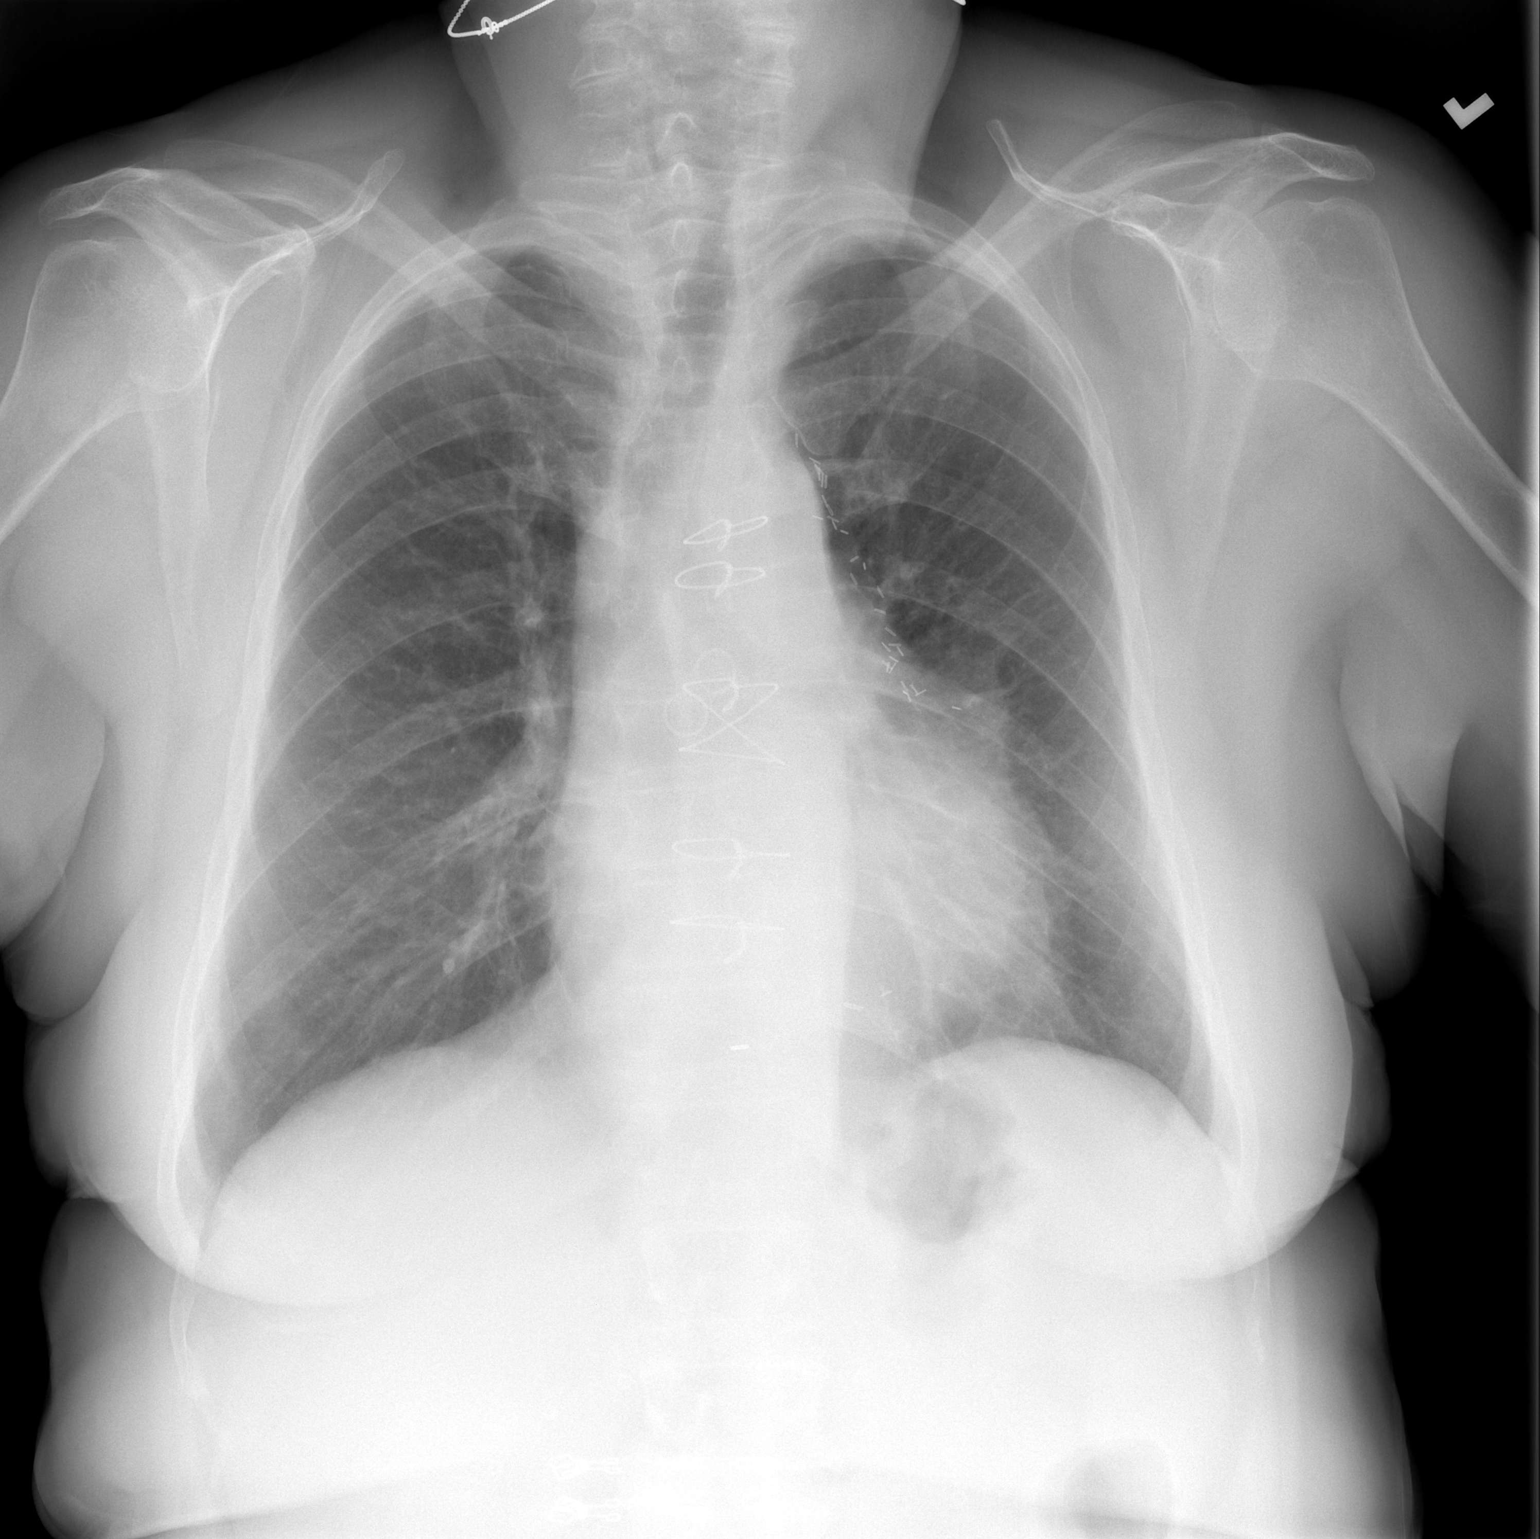

[w chest lat]
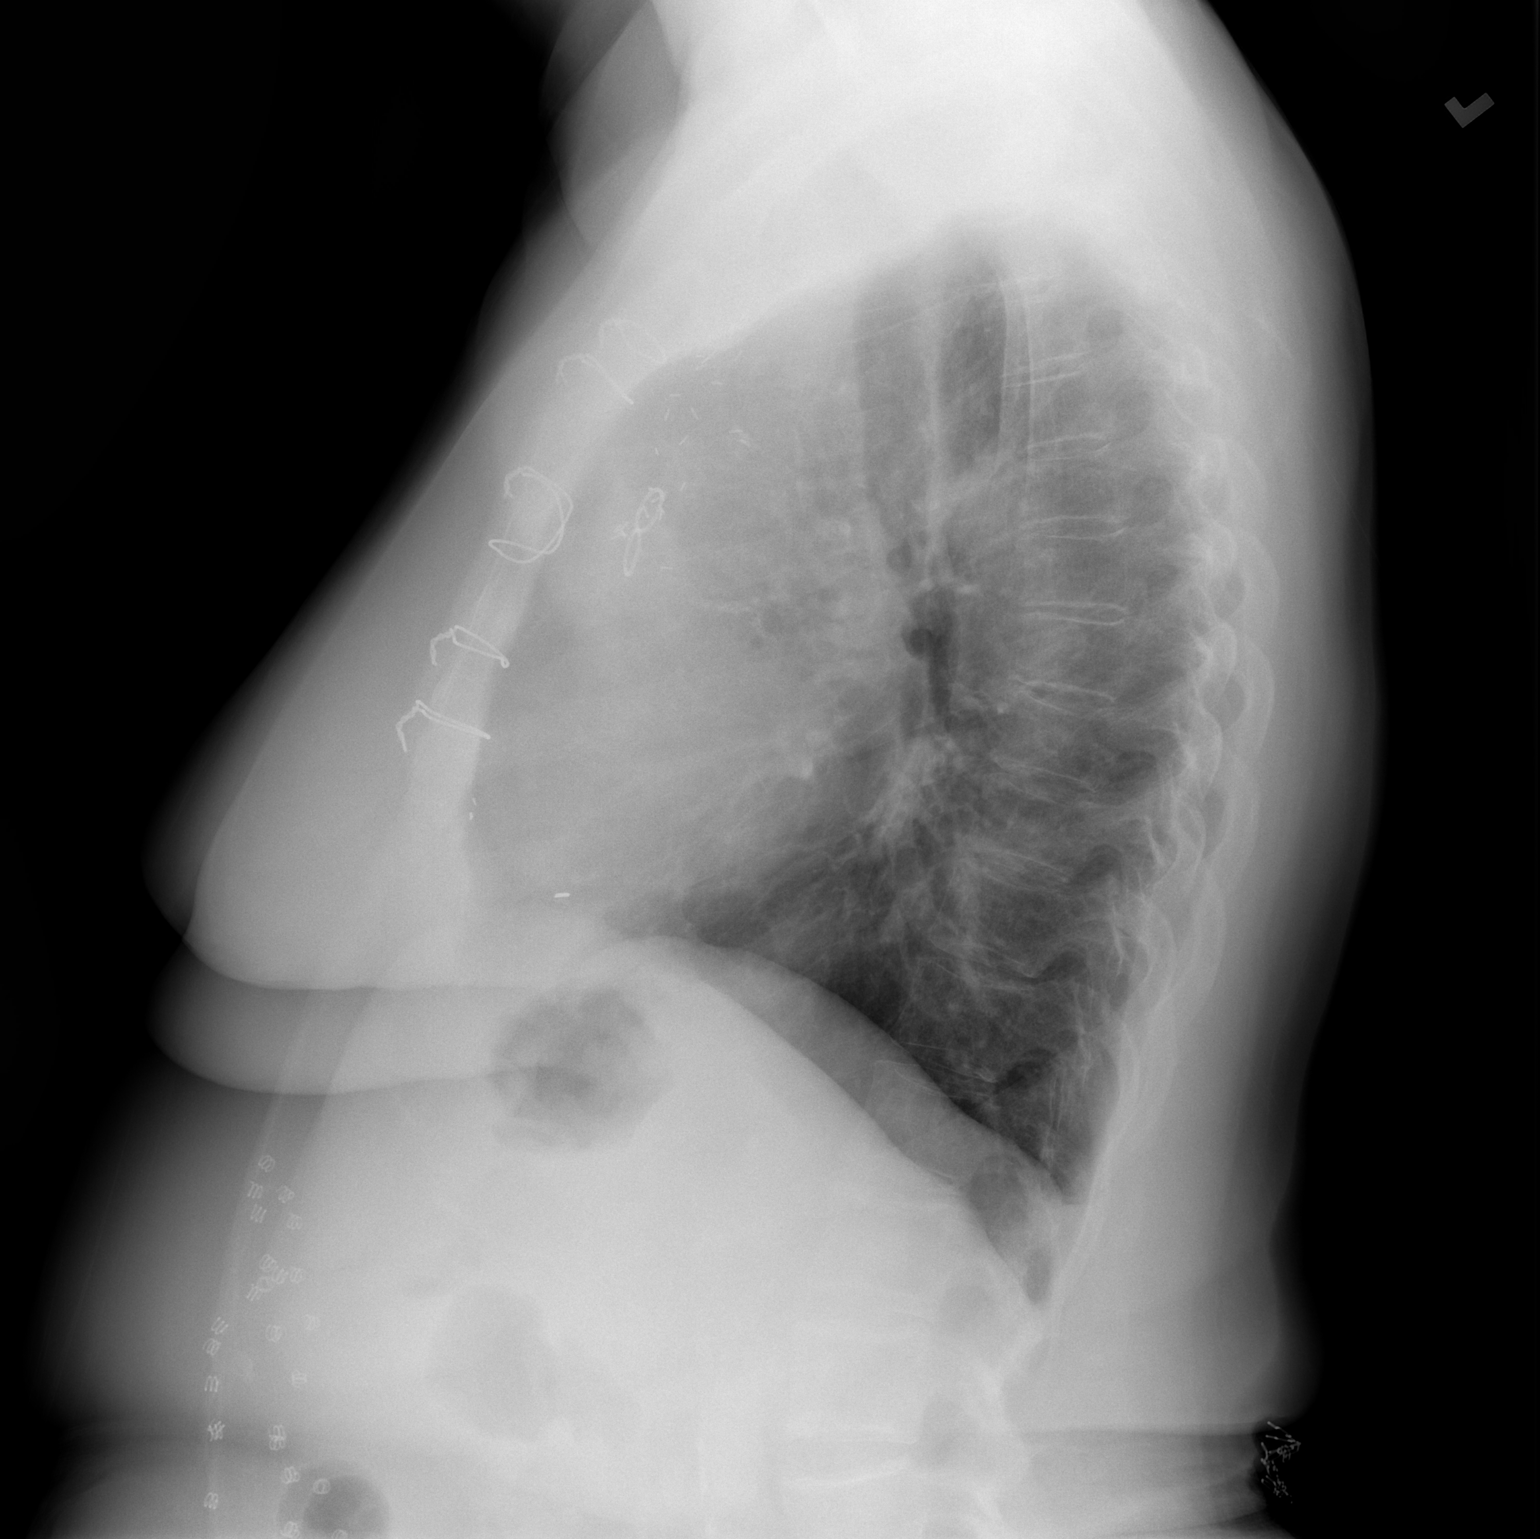

[2 of 2 positions shown; findings below may reference images not displayed]

FINDINGS: Normal heart size. Lobulation along the left heart border is from
fat based on 0562 CT. Status post CABG. There is no edema,
consolidation, effusion, or pneumothorax. Abdominal hernia repair
IMPRESSION: No evidence of active disease.

## 2019-10-16 ENCOUNTER — Other Ambulatory Visit: Payer: Self-pay

## 2019-10-16 DIAGNOSIS — E785 Hyperlipidemia, unspecified: Secondary | ICD-10-CM

## 2019-10-16 DIAGNOSIS — E1159 Type 2 diabetes mellitus with other circulatory complications: Secondary | ICD-10-CM

## 2019-10-16 DIAGNOSIS — E1169 Type 2 diabetes mellitus with other specified complication: Secondary | ICD-10-CM

## 2019-10-16 DIAGNOSIS — N1831 Chronic kidney disease, stage 3a: Secondary | ICD-10-CM

## 2019-10-17 ENCOUNTER — Telehealth: Payer: Self-pay | Admitting: Pharmacist

## 2019-10-17 NOTE — Progress Notes (Addendum)
Chronic Care Management Pharmacy Assistant   Name: [name]  MRN: [id number] DOB: [birth year]  Reason for Encounter: Medication Review/ Initial Questions for Pharmacist visit on 10-20-19.  PCP : Colon Branch, MD  Allergies:   Allergies  Allergen Reactions  . Codeine     REACTION: makes her nervous, orTylenol #3  . Ibuprofen     REACTION: nervous  . Meperidine Hcl     REACTION: nasuea and vomitting  . Naproxen Sodium     REACTION: nervous    Medications: Outpatient Encounter Medications as of 10/17/2019  Medication Sig Note  . amLODipine (NORVASC) 5 MG tablet TAKE 1 TABLET(5 MG) BY MOUTH DAILY   . aspirin 81 MG chewable tablet Chew 1 tablet (81 mg total) by mouth 2 (two) times daily with a meal.   . atenolol (TENORMIN) 100 MG tablet Take 1 tablet (100 mg total) by mouth daily.   Marland Kitchen atorvastatin (LIPITOR) 20 MG tablet TAKE 1 TABLET(20 MG) BY MOUTH DAILY   . azelastine (ASTELIN) 0.1 % nasal spray Place 2 sprays into both nostrils at bedtime as needed for rhinitis. Use in each nostril as directed   . Blood Glucose Monitoring Suppl (ONE TOUCH ULTRA SYSTEM KIT) W/DEVICE KIT 1 kit by Does not apply route once.   . cholecalciferol (VITAMIN D) 1000 UNITS tablet Take 1,000 Units by mouth daily.     . clopidogrel (PLAVIX) 75 MG tablet Take 1 tablet (75 mg total) by mouth daily.   . clorazepate (TRANXENE) 7.5 MG tablet Take 1 tablet (7.5 mg total) by mouth as needed.   . fluticasone (FLONASE) 50 MCG/ACT nasal spray SHAKE LIQUID AND USE 2 SPRAYS IN EACH NOSTRIL DAILY (Patient taking differently: Place 2 sprays into both nostrils daily as needed for allergies. )   . glucose blood (ONETOUCH ULTRA) test strip Use Onetouch Ultra test strips as instructed to check blood sugar twice daily.   . insulin NPH Human (HUMULIN N,NOVOLIN N) 100 UNIT/ML injection Inject 38 units into the skin every morning and inject 20 units into the skin at bedtime.   . insulin regular (NOVOLIN R,HUMULIN R)  100 units/mL injection Inject 14 Units into the skin 2 (two) times daily before a meal.    . Insulin Syringe-Needle U-100 (INSULIN SYRINGE .5CC/31GX5/16") 31G X 5/16" 0.5 ML MISC USE TO INJECT INSULIN 5 TIMES PER DAY   . isosorbide mononitrate (IMDUR) 30 MG 24 hr tablet TAKE 1 TABLET BY MOUTH EVERY DAY   . metFORMIN (GLUCOPHAGE) 500 MG tablet Take 1 tablet (500 mg total) by mouth daily with supper.   . mometasone (ELOCON) 0.1 % cream Apply 1 application topically daily. Use as directed   . Multiple Vitamin (MULTIVITAMIN) capsule Take 1 capsule by mouth daily.     . nitroGLYCERIN (NITROSTAT) 0.3 MG SL tablet Place 1 tablet (0.3 mg total) under the tongue every 5 (five) minutes as needed for chest pain (ER if no better after 3 tablets). (Patient not taking: Reported on 09/12/2019) 10/01/2018: PRN  . omeprazole (PRILOSEC) 20 MG capsule TAKE 1 CAPSULE(20 MG) BY MOUTH DAILY   . VICTOZA 18 MG/3ML SOPN ADMINISTER 1.2 MG UNDER THE SKIN DAILY    No facility-administered encounter medications on file as of 10/17/2019.    Current Diagnosis: Patient Active Problem List   Diagnosis Date Noted  . Osteoporosis 09/14/2019  . 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 Age-related osteoporosis with current pathological fracture 10/01/2018  . Intertrochanteric fracture of left femur, closed, initial  encounter ([place]) 08/27/2018  . Acute kidney injury superimposed on chronic kidney disease ([place]) 08/27/2018  . CAD (coronary artery disease)   . Chest pain 04/19/2017  . Iron deficiency anemia 08/30/2016  . Shortness of breath 04/19/2016  . Positive cardiac stress test   . PCP NOTES >>>>>>>>>>>>>>>>>>> 12/02/2014  . Annual physical exam  08/30/2014  . 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 Chronic kidney disease, stage III (moderate) ([place]) 09/18/2013  . Anxiety and depression 10/03/2010  . IBS (irritable bowel syndrome) 09/01/2010  . Vitamin B deficiency  05/20/2009  . HERNIA, VENTRAL 08/27/2008  . GASTRITIS, HX OF 08/27/2008  . 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 COLONIC POLYPS 12/20/2006  . VENOUS INSUFFICIENCY 12/20/2006  . Insulin dependent diabetes mellitus with complications 59/97/7414  . Hyperlipidemia associated with type 2 diabetes mellitus ([place]) 10/16/2006  . Hypertension associated with diabetes ([place]) 10/16/2006  . Hx of CABG 10/16/2006  . GERD 10/16/2006  . 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 SHINGLES, HX OF 10/16/2006    Goals Addressed   None    Have you seen any other providers since your last visit? No Any changes in your medications or health? No Patient states this morning she felt out of breath. States she rested on the sink and got a cold rag and that helped. Any side effects from any medications? No Do you have an symptoms or problems not managed by your medications? Yes  Patient states she is in the doughnut hole due to Victoza and insulin. Any concerns about your health right now? Patient states this morning she felt out of breath. States she rest on the sink and got a cold rag and that helped. Has your provider asked that you check blood pressure, blood sugar, or follow special diet at home? Patient states she does take sugar levels twice daily. Do you get any type of exercise on a regular basis? No Can you think of a goal you would like to reach for your health? Patient states she would like to walk better. Do you have any problems getting your medications? Patient states she is in the doughnut hole due to Victoza and insulin is expensive. Is there anything that you would like to discuss during the appointment? No  Please have all medications and supplements on hand for your telephone appointment on 10-20-19.  Documented preliminary medication plan in preparation for patient's initial chronic care management visit.   Follow-Up:  Pharmacist Review   [place] Shannon, [place] [facility] [phone]  Noted patient may need help with patient assistance.  Reviewed by: De Blanch, PharmD Clinical Pharmacist [facility] [phone]

## 2019-10-20 ENCOUNTER — Other Ambulatory Visit: Payer: Self-pay

## 2019-10-20 ENCOUNTER — Ambulatory Visit: Payer: Medicare Other | Admitting: Pharmacist

## 2019-10-20 DIAGNOSIS — IMO0002 Reserved for concepts with insufficient information to code with codable children: Secondary | ICD-10-CM

## 2019-10-20 DIAGNOSIS — M8000XS Age-related osteoporosis with current pathological fracture, unspecified site, sequela: Secondary | ICD-10-CM

## 2019-10-20 DIAGNOSIS — E1159 Type 2 diabetes mellitus with other circulatory complications: Secondary | ICD-10-CM

## 2019-10-20 DIAGNOSIS — E1169 Type 2 diabetes mellitus with other specified complication: Secondary | ICD-10-CM

## 2019-10-20 DIAGNOSIS — I152 Hypertension secondary to endocrine disorders: Secondary | ICD-10-CM

## 2019-10-20 DIAGNOSIS — E1122 Type 2 diabetes mellitus with diabetic chronic kidney disease: Secondary | ICD-10-CM

## 2019-10-20 NOTE — Chronic Care Management (AMB) (Signed)
Chronic Care Management Pharmacy  Name: [name]  MRN: [id number] DOB: 1940-12-09  Chief Complaint/ HPI  Dodson Branch,  79 y.o. , female presents for their Initial CCM visit with the clinical pharmacist via telephone due to COVID-19 Pandemic.  PCP : Colon Branch, MD  Their chronic conditions include: Hypertension, Hyperlipidemia/CAD, Diabetes, Depression/Anxiety, Osteoporosis, GERD, Allergic Rhinitis  Office Visits: 09/12/19: Visit w/ Dr. Larose Kells - Stopped fosamax due to decrease in kidney function; agree to switch to Prolia. Chronic renal insufficiency - avoid NSAIDs, could consider restart ARBs. RTC 3 months.   06/11/19: Visit w/ Dr. Larose Kells - Referral to pulmonology.   Consult Visit: 08/25/19: Endo visit w/ Dr. Dwyane Dee - No med changes noted.  05/21/19: Cardio visit w/ Dr. Gwenlyn Found - DOE, consider referral to pulmonlogy.   Medications: Outpatient Encounter Medications as of 10/20/2019  Medication Sig Note  . amLODipine (NORVASC) 5 MG tablet TAKE 1 TABLET(5 MG) BY MOUTH DAILY   . aspirin 81 MG chewable tablet Chew 1 tablet (81 mg total) by mouth 2 (two) times daily with a meal.   . atenolol (TENORMIN) 100 MG tablet Take 1 tablet (100 mg total) by mouth daily.   Marland Kitchen atorvastatin (LIPITOR) 20 MG tablet TAKE 1 TABLET(20 MG) BY MOUTH DAILY   . azelastine (ASTELIN) 0.1 % nasal spray Place 2 sprays into both nostrils at bedtime as needed for rhinitis. Use in each nostril as directed   . Blood Glucose Monitoring Suppl (ONE TOUCH ULTRA SYSTEM KIT) W/DEVICE KIT 1 kit by Does not apply route once.   . cholecalciferol (VITAMIN D) 1000 UNITS tablet Take 1,000 Units by mouth daily.     . clopidogrel (PLAVIX) 75 MG tablet Take 1 tablet (75 mg total) by mouth daily.   . clorazepate (TRANXENE) 7.5 MG tablet Take 1 tablet (7.5 mg total) by mouth as needed.   . fluticasone (FLONASE) 50 MCG/ACT nasal spray SHAKE LIQUID AND USE 2 SPRAYS IN EACH NOSTRIL DAILY (Patient taking differently: Place 2 sprays  into both nostrils daily as needed for allergies. )   . glucose blood (ONETOUCH ULTRA) test strip Use Onetouch Ultra test strips as instructed to check blood sugar twice daily.   . insulin NPH Human (HUMULIN N,NOVOLIN N) 100 UNIT/ML injection Inject 38 units into the skin every morning and inject 20 units into the skin at bedtime.   . insulin regular (NOVOLIN R,HUMULIN R) 100 units/mL injection Inject 14 Units into the skin 2 (two) times daily before a meal.    . Insulin Syringe-Needle U-100 (INSULIN SYRINGE .5CC/31GX5/16") 31G X 5/16" 0.5 ML MISC USE TO INJECT INSULIN 5 TIMES PER Lluvia Gwynne   . isosorbide mononitrate (IMDUR) 30 MG 24 hr tablet TAKE 1 TABLET BY MOUTH EVERY Alicen Donalson   . metFORMIN (GLUCOPHAGE) 500 MG tablet Take 1 tablet (500 mg total) by mouth daily with supper.   . mometasone (ELOCON) 0.1 % cream Apply 1 application topically daily. Use as directed   . Multiple Vitamin (MULTIVITAMIN) capsule Take 1 capsule by mouth daily.     . nitroGLYCERIN (NITROSTAT) 0.3 MG SL tablet Place 1 tablet (0.3 mg total) under the tongue every 5 (five) minutes as needed for chest pain (ER if no better after 3 tablets). (Patient not taking: Reported on 09/12/2019) 10/01/2018: PRN  . omeprazole (PRILOSEC) 20 MG capsule TAKE 1 CAPSULE(20 MG) BY MOUTH DAILY   . VICTOZA 18 MG/3ML SOPN ADMINISTER 1.2 MG UNDER THE SKIN DAILY    No facility-administered encounter medications  on file as of 10/20/2019.   SDOH Screenings   Alcohol Screen:   . Last Alcohol Screening Score (AUDIT): Not on file  Depression (PHQ2-9): Medium Risk  . PHQ-2 Score: 11  Financial Resource Strain: Medium Risk  . Difficulty of Paying Living Expenses: Somewhat hard  Food Insecurity:   . Worried About Charity fundraiser in the Last Year: Not on file  . Ran Out of Food in the Last Year: Not on file  Housing:   . Last Housing Risk Score: Not on file  Physical Activity:   . Days of Exercise per Week: Not on file  . Minutes of Exercise per Session:  Not on file  Social Connections:   . Frequency of Communication with Friends and Family: Not on file  . Frequency of Social Gatherings with Friends and Family: Not on file  . Attends Religious Services: Not on file  . Active Member of Clubs or Organizations: Not on file  . Attends Archivist Meetings: Not on file  . Marital Status: Not on file  Stress:   . Feeling of Stress : Not on file  Tobacco Use: Low Risk   . Smoking Tobacco Use: Never Smoker  . Smokeless Tobacco Use: Never Used  Transportation Needs:   . Film/video editor (Medical): Not on file  . Lack of Transportation (Non-Medical): Not on file    Current Diagnosis/Assessment:  Goals Addressed   None    Social Hx:  Loves the outdoors. Lost son in 55. 8lb baby that was still born 59 daughter in 23.    Hypertension   BP goal is:  <130/80  Office blood pressures are  BP Readings from Last 3 Encounters:  09/12/19 (!) 141/68  08/25/19 130/72  07/17/19 140/62   Patient checks BP at home when feeling symptomatic Patient home BP readings are ranging: Unable to assess  Patient has failed these meds in the past: None noted  Patient is currently uncontrolled on the following medications:  . Amlodipine 45m daily AM . Atenolol 1033mdaily AM . Irbesartan 30082m.5 tabs daily PM (not listed in med list or dispense report; pt states she doesn't recall ever being stopped)  Patient asks about the results of her renal ultrasound.  Discussed results.   We discussed BP goal  Plan -Check BP 2-3 times per week and record -Continue current medications     Hyperlipidemia/CAD   Followed by Cardio (Dr. BerGwenlyn FoundLDL goal <70  Lipid Panel     Component Value Date/Time   CHOL 116 11/25/2018 1115   TRIG 150.0 (H) 11/25/2018 1115   HDL 36.90 (L) 11/25/2018 1115   LDLCALC 49 11/25/2018 1115   LDLDIRECT 36.0 05/26/2016 0946    Hepatic Function Latest Ref Rng & Units 07/15/2019 07/08/2019 01/07/2019    Total Protein 6.0 - 8.3 g/dL 6.5 7.0 7.1  Albumin 3.5 - 5.2 g/dL 3.9 3.8 4.3  AST 0 - 37 U/L 18 27 17   ALT 0 - 35 U/L 17 23 13   Alk Phosphatase 39 - 117 U/L 86 82 90  Total Bilirubin 0.2 - 1.2 mg/dL 0.5 0.6 0.5  Bilirubin, Direct <=0.2 mg/dL - - -     The ASCVD Risk score (GofLeminget al., 2013) failed to calculate for the following reasons:   The 2013 ASCVD risk score is only valid for ages 40 46 79 71Patient has failed these meds in past: rosuvastatin (cost?), simvastatin? Patient is currently controlled, except  for TRIG and HDL on the following medications:  . Aspirin 66m daily AM . Clopidogrel 775mdaily PM . Isosorbide 3058maily AM . Nitroglycerin 0.3mg80m needed . Atorvastatin 20mg46mly AM   Had a "spell" where she was gasping on Friday 10/17/19 . Lasted for about a minute. Did not use NTG. She woke up SOB. She has felt fine ever since.  Recommended patient to follow up with cardio vs pulmonology? Denies chest pain then and now. Denies dizziness or headache.  We discussed:  Possibilty of using NTG if she has another "spell"  Plan -Continue current medications  -Follow up with cardiology to discuss spell with shortness of breath  Future Plan -Follow up on pulmonology referral  Diabetes   Followed by Endocrinology (Dr. KumarDwyane Deec goal <7%  Recent Relevant Labs: Lab Results  Component Value Date/Time   HGBA1C 8.0 (H) 07/15/2019 11:11 AM   HGBA1C 6.5 03/06/2019 10:49 AM   GFR 33.95 (L) 07/15/2019 11:11 AM   GFR 42.44 (L) 05/12/2019 01:02 PM   MICROALBUR 17.7 (H) 12/09/2018 01:59 PM   MICROALBUR 57.4 (H) 05/24/2017 02:23 PM    Last diabetic Eye exam:  Lab Results  Component Value Date/Time   HMDIABEYEEXA Retinopathy (A) 04/17/2017 12:00 AM    Last diabetic Foot exam: No results found for: HMDIABFOOTEX   Checking BG: 2x per Meisha Salone  Recent FBG Readings: 135 129 117 113 Avg 123.5 Recent 2hr PP BG readings:    62 113 153 128 Avg 114  Patient has failed these meds in past: onglyza (cost) Patient is currently uncontrolled on the following medications: . Insulin NPH 38 units AM and 24 units HS . Insulin Regular 16 units twice daily before meal . Metformin 500mg 56my at supper . Victoza 1.2mg da46m HS  Patient states that the cost of Victoza is prohibitive. Will see if patient qualifies for patient assistance program to reduce barrier.   We discussed: DM goal  Plan -Continue checking BG twice daily -Continue current medications  -Complete Victoza patient assistance program application  Future Plan -If a1c >8% consider increasing Victoza to 1.8mg dai31m Depression/Anxiety   Assess further at follow up visit  Depression screen PHQ 2/9 [facility]0/2021 09/20/2018 02/19/2018  Decreased Interest 1 0 0  Down, Depressed, Hopeless 0 0 0  PHQ - 2 Score 1 0 0  Altered sleeping 1 - 0  Tired, decreased energy 3 - 0  Change in appetite 2 - 0  Feeling bad or failure about yourself  0 - 0  Trouble concentrating 1 - 0  Moving slowly or fidgety/restless 3 - 0  Suicidal thoughts 0 - 0  PHQ-9 Score 11 - 0  Difficult doing work/chores Very difficult - -  Some recent data might be hidden    Patient has failed these meds in past: None noted  Patient is currently controlled on the following medications:  . Clorazepate 7.5mg as n52med (rarely uses)   Plan -Continue current medications  Osteoporosis   Last DEXA Scan: 12/10/2015  T-Score femoral neck: -2.5 (R), -2.9 (L)  T-Score lumbar spine: -0.8  Vit D, 25-Hydroxy  Date Value Ref Range Status  08/28/2018 37.2 30.0 - 100.0 ng/mL Final    Comment:    (NOTE) Vitamin D deficiency has been defined by the Institute of Medicine and an Endocrine Society practice guideline as a level of serum 25-OH vitamin D less than 20 ng/mL (1,2). The Endocrine Society went on to further define vitamin D insufficiency as a  level between 21 and 29 ng/mL (2). 1.  IOM (Institute of Medicine). 2010. Dietary reference   intakes for calcium and D. [place]: The   [employer]. 2. Holick MF, Binkley , Bischoff-Ferrari HA, et al.   Evaluation, treatment, and prevention of vitamin D   deficiency: an Endocrine Society clinical practice   guideline. JCEM. 2011 Jul; 96(7):1911-30. Performed At: [facility] [place], [place] [id number] Rush Farmer MD [id number]      Patient is a candidate for pharmacologic treatment due to T-Score < -2.5 in femoral neck  Patient has failed these meds in past: None noted  Patient is currently uncontrolled on the following medications:  Marland Kitchen Vitamin D 1000 units daily AM  Fosamax: "I hate that pill!" She was taking it every Tuesday. She doesn't like that she has to take this with water. She doesn't like water. She tries to drink at least 16oz of water per Catherina Pates. She has to make herself drink water.  This bothers her because she is unsure why you must remain upright and drink water.  Prolia: Discussed this with Dr. Larose Kells at visit on 09/12/19. Would like follow up on status of Prolia.   We discussed:  Recommend 1200 mg of calcium daily from dietary and supplemental sources. Counseled on oral bisphosphonate administration: take in the morning, 30 minutes prior to food with 6-8 oz of water. Do not lie down for at least 30 minutes after taking.  Plan -Follow up on Prolia authorization -Continue current medications  GERD   Patient has failed these meds in past: None noted  Patient is currently controlled on the following medications:  . Omeprazole 74m daily  Breakthrough Sx: None  Patient would like to stop taking this medication. She does not report having any symptoms.   We discussed:  the risk/benefit of continuation vs discontinuation of long term PPI use   Plan -Consider taking omeprazole every other Kalisa Girtman. -CThailandto call in 2 weeks to assess omeprazole taper.   Allergic  Rhinitis    Patient has failed these meds in past: None noted  Patient is currently controlled on the following medications: . Azelastine 0.1% 2 sprays both nostrils as needed  . Fluticasone 534m 2 sprays both nostrils as needed  Plan -Continue current medications    Medication Management   Pt uses [place] all medications Uses pill box? Yes  Miscellaneous Meds Mometasone 0.1% cream Multivitamin  We discussed: Discussed benefits of medication synchronization, packaging and delivery as well as enhanced pharmacist oversight with [employer]. Patient would like to think about this.  Plan  Continue current medication management strategy    Follow up:  2 week omeprazole check in 3 month phone visit  KaDe BlanchPharmD Clinical Pharmacist [phone]

## 2019-10-20 NOTE — Patient Instructions (Signed)
Visit Information  Goals Addressed            This Visit's Progress    Chronic Care Management Pharmacy Care Plan       CARE PLAN ENTRY (see longitudinal plan of care for additional care plan information)  Current Barriers:   Chronic Disease Management support, education, and care coordination needs related to Hypertension, Hyperlipidemia/CAD, Diabetes, Depression/Anxiety, Osteoporosis, GERD, Allergic Rhinitis   Hypertension BP Readings from Last 3 Encounters:  09/12/19 (!) 141/68  08/25/19 130/72  07/17/19 140/62    Pharmacist Clinical Goal(s): o Over the next 90 days, patient will work with PharmD and providers to achieve BP goal <130/80  Current regimen:   Amlodipine 5mg  daily AM  Atenolol 100mg  daily AM  Irbesartan 300mg  0.5 tabs daily PM  Interventions: o Requested patient to check her BP 2-3 times per week and record  Patient self care activities - Over the next 90 days, patient will: o Check BP 2-3 times per week, document, and provide at future appointments o Ensure daily salt intake < 2300 mg/Denis Carreon  Hyperlipidemia/CAD Lab Results  Component Value Date/Time   LDLCALC 49 11/25/2018 11:15 AM   LDLDIRECT 36.0 05/26/2016 09:46 AM    Pharmacist Clinical Goal(s): o Over the next 90 days, patient will work with PharmD and providers to maintain LDL goal < 70  Current regimen:   Aspirin 81mg  daily AM  Clopidogrel 75mg  daily PM  Isosorbide 30mg  daily AM  Nitroglycerin 0.3mg  as needed  Atorvastatin 20mg  daily AM  Interventions: o Discussed LDL goal o Encouraged patient to follow up with cardiologist  Patient self care activities - Over the next 90 days, patient will: o Maintain cholesterol medication regimen. o Follow up with cardiologist  Diabetes Lab Results  Component Value Date/Time   HGBA1C 8.0 (H) 07/15/2019 11:11 AM   HGBA1C 6.5 03/06/2019 10:49 AM    Pharmacist Clinical Goal(s): o Over the next 180 days, patient will work with PharmD  and providers to achieve A1c goal <7%  Current regimen:   Insulin NPH 38 units AM and 24 units HS  Insulin Regular 16 units twice daily before meal  Metformin 500mg  daily at supper  Victoza 1.2mg  daily HS  Interventions: o Discussed DM goal o Coordinate patient assistance application for Victoza for patient  Patient self care activities - Over the next 90 days, patient will: o Check blood sugar twice daily, document, and provide at future appointments o Contact provider with any episodes of hypoglycemia  Osteoporosis   Pharmacist Clinical Goal(s) o Over the next 90 days, patient will work with PharmD and providers to reduce risk of fracture due to osteoporosis  Current regimen:  o Vitamin D 1000 units daily AM  Interventions: o Consider intake of 1200mg  of calcium daily through diet and/or supplementation o Follow up on Prolia authorization status  Patient self care activities - Over the next 90 days, patient will: o Maintain medication regimen for osteoporosis  GERD  Pharmacist Clinical Goal(s) o Over the next 90 days, patient will work with PharmD and providers to reduce symptoms of GERD and reduce polypharmacy  Current regimen:  o Omeprazole 20mg  daily  Interventions: o Discussed the risk/benefit of continuation vs discontinuation of long term PPI use  o Consider taking omeprazole every other Taima Rada for 2 weeks  Patient self care activities - Over the next 90 days, patient will: o Consider taking omeprazole every other Lou Irigoyen for 2 weeks  Medication management  Pharmacist Clinical Goal(s): o Over the  next 90 days, patient will work with PharmD and providers to maintain optimal medication adherence  Current pharmacy: [facility]  Interventions o Comprehensive medication review performed. o Continue current medication management strategy  Patient self care activities - Over the next 90 days, patient will: o Focus on medication adherence by filling and taking  medications appropriately  o Take medications as prescribed o Report any questions or concerns to PharmD and/or provider(s)  Initial goal documentation        Ms. Mcconico was given information about Chronic Care Management services today including:  1. CCM service includes personalized support from designated clinical staff supervised by her physician, including individualized plan of care and coordination with other care providers 2. 24/7 contact phone numbers for assistance for urgent and routine care needs. 3. Standard insurance, coinsurance, copays and deductibles apply for chronic care management only during months in which we provide at least 20 minutes of these services. Most insurances cover these services at 100%, however patients may be responsible for any copay, coinsurance and/or deductible if applicable. This service may help you avoid the need for more expensive face-to-face services. 4. Only one practitioner may furnish and bill the service in a calendar month. 5. The patient may stop CCM services at any time (effective at the end of the month) by phone call to the office staff.  Patient agreed to services and verbal consent obtained.   The patient verbalized understanding of instructions provided today and agreed to receive a mailed copy of patient instruction and/or educational materials. Telephone follow up appointment with pharmacy team member scheduled for: 01/21/2020  Melvenia Beam Saajan Willmon, PharmD Clinical Pharmacist [facility] [phone]   How to Take Your Blood Pressure You can take your blood pressure at home with a machine. You may need to check your blood pressure at home:  To check if you have high blood pressure (hypertension).  To check your blood pressure over time.  To make sure your blood pressure medicine is working. Supplies needed: You will need a blood pressure machine, or monitor. You can buy one at a drugstore or  online. When choosing one:  Choose one with an arm cuff.  Choose one that wraps around your upper arm. Only one finger should fit between your arm and the cuff.  Do not choose one that measures your blood pressure from your wrist or finger. Your doctor can suggest a monitor. How to prepare Avoid these things for 30 minutes before checking your blood pressure:  Drinking caffeine.  Drinking alcohol.  Eating.  Smoking.  Exercising. Five minutes before checking your blood pressure:  Pee.  Sit in a dining chair. Avoid sitting in a soft couch or armchair.  Be quiet. Do not talk. How to take your blood pressure Follow the instructions that came with your machine. If you have a digital blood pressure monitor, these may be the instructions: 1. Sit up straight. 2. Place your feet on the floor. Do not cross your ankles or legs. 3. Rest your left arm at the level of your heart. You may rest it on a table, desk, or chair. 4. Pull up your shirt sleeve. 5. Wrap the blood pressure cuff around the upper part of your left arm. The cuff should be 1 inch (2.5 cm) above your elbow. It is best to wrap the cuff around bare skin. 6. Fit the cuff snugly around your arm. You should be able to place only one finger between the cuff  and your arm. 7. Put the cord inside the groove of your elbow. 8. Press the power button. 9. Sit quietly while the cuff fills with air and loses air. 10. Write down the numbers on the screen. 11. Wait 2-3 minutes and then repeat steps 1-10. What do the numbers mean? Two numbers make up your blood pressure. The first number is called systolic pressure. The second is called diastolic pressure. An example of a blood pressure reading is "120 over 80" (or 120/80). If you are an adult and do not have a medical condition, use this guide to find out if your blood pressure is normal: Normal  First number: below 120.  Second number: below 80. Elevated  First number:  120-129.  Second number: below 80. Hypertension stage 1  First number: 130-139.  Second number: 80-89. Hypertension stage 2  First number: 140 or above.  Second number: 3 or above. Your blood pressure is above normal even if only the top or bottom number is above normal. Follow these instructions at home:  Check your blood pressure as often as your doctor tells you to.  Take your monitor to your next doctor's appointment. Your doctor will: ? Make sure you are using it correctly. ? Make sure it is working right.  Make sure you understand what your blood pressure numbers should be.  Tell your doctor if your medicines are causing side effects. Contact a doctor if:  Your blood pressure keeps being high. Get help right away if:  Your first blood pressure number is higher than 180.  Your second blood pressure number is higher than 120. This information is not intended to replace advice given to you by your health care provider. Make sure you discuss any questions you have with your health care provider. Document Revised: 12/01/2016 Document Reviewed: 05/28/2015 Elsevier Patient Education  2020 [employer].

## 2019-10-22 NOTE — Progress Notes (Signed)
Virtual Visit via Telephone Note   This visit type was conducted due to national recommendations for restrictions regarding the COVID-19 Pandemic (e.g. social distancing) in an effort to limit this patient's exposure and mitigate transmission in our community.  Due to her co-morbid illnesses, this patient is at least at moderate risk for complications without adequate follow up.  This format is felt to be most appropriate for this patient at this time.  The patient did not have access to video technology/had technical difficulties with video requiring transitioning to audio format only (telephone).  All issues noted in this document were discussed and addressed.  No physical exam could be performed with this format.  Please refer to the patient's chart for her  consent to telehealth for [facility].  Evaluation Performed:  Follow-up visit  This visit type was conducted due to national recommendations for restrictions regarding the COVID-19 Pandemic (e.g. social distancing).  This format is felt to be most appropriate for this patient at this time.  All issues noted in this document were discussed and addressed.  No physical exam was performed (except for noted visual exam findings with Video Visits).  Please refer to the patient's chart (MyChart message for video visits and phone note for telephone visits) for the patient's consent to telehealth for [place]  Date:  10/27/2019   ID:  Roberts, DOB [birth year], MRN [id number]  Patient Location:  [address] [place] [address]   Provider location:     [address] Office [phone] Fax [phone]   PCP:  Colon Branch, MD  Cardiologist:  Quay Burow, MD  Electrophysiologist:  None   Chief Complaint: Follow-up for shortness of breath.  History of Present Illness:    [name] is a 79 y.o. female who presents via Therapist, music for a telehealth visit today.  Patient verified DOB and address.  She has a PMH of hypertension, GERD, IBS, hyperlipidemia, DJD, Dupuytrens contracture bilateral hands, CKD stage III, coronary artery disease status post CABG, and venous insufficiency.  She was last seen by Dr. Gwenlyn Found on 05/21/2019 for evaluation of her progressive dyspnea with exertion.  She underwent echocardiogram 05/12/2019 which was normal.  A nuclear stress test showed no ischemia.  She denied chest pain but indicated her breathing was her main issue.  She was asked to have her PCP referred her to pulmonology for further evaluation.  It was noted that if she continued to have progressive dyspnea left heart cath may be needed.   She is seen virtually today and states she feels well. She had one episode of PND 10/17/19.  She states this is never happened to her before.  She woke up and was gasping for air.  The episode lasted for less than 1 minute and resolved.  She has not had further episodes.  She continues to be physically active at home picking up her garden, mowing, and doing housework.  She tries to follow a low-sodium diet.  She did not go to pulmonology but does say that her breathing overall is better.  She has noted a dry cough for the last several months and states that she will follow up with her PCP regarding her cough.  I will give her the salty 6 diet sheet, have her continue to be physically active, contact the office with further PND episodes, and follow-up in 6 months.  Discussed the possibility of sleep  study with further PND episodes.  She expressed understanding.  Today she denies chest pain, shortness of breath, lower extremity edema, fatigue, palpitations, melena, hematuria, hemoptysis, diaphoresis, weakness, presyncope, syncope, orthopnea, and PND.   The patient does not symptoms concerning for COVID-19 infection (fever, chills, cough, or new SHORTNESS OF BREATH).    Prior CV studies:   The  following studies were reviewed today:  Echocardiogram 05/12/2019 IMPRESSIONS    1. Left ventricular ejection fraction, by estimation, is 55 to 60%. The  left ventricle has normal function. The left ventricle has no regional  wall motion abnormalities. Left ventricular diastolic parameters are  indeterminate.  2. Right ventricular systolic function is normal. The right ventricular  size is normal.  3. Left atrial size was mildly dilated.  4. The mitral valve is normal in structure. Trivial mitral valve  regurgitation. No evidence of mitral stenosis.  5. The aortic valve is normal in structure. Aortic valve regurgitation is  not visualized. No aortic stenosis is present.  Nuclear stress test 05/15/2019  Nuclear stress EF: 62%. No regional wall motion abnormalities.  There was no ST segment deviation noted during stress.  Defect 1: There is a medium defect of moderate severity present in the mid inferolateral location. Focal area of mid inferolateral hypoperfusion noted.  This is a low risk study. No significant ischemia identified. Possible old mid inferolateral infarct pattern.   Past Medical History:  Diagnosis Date  . Acute blood loss anemia 09/17/2015  . Allergy   . Anemia   . Anginal pain ([place]) 2017  . Anxiety   . Arthritis    "hands and knees mostly" (09/16/2015)  . B12 deficiency anemia   . CAD (coronary artery disease)    Dr Gwenlyn Found  . Cataract    bil cataracts removed  . 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 Clotting disorder ([place])   . Depression   . DJD (degenerative joint disease)   . 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 Gastritis   . GERD (gastroesophageal reflux disease)   . Headache(784.0)   . History of blood transfusion    "I"ve had 20 some; thru birth of children, loss of blood, last 2 were in ~ 1999 before my cancer surgery" (09/16/2015)  . History of hiatal hernia   . History of shingles   . Hx of colonic polyps   .  Hyperlipidemia   . Hypertension   . 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 Myocardial infarction ([place])    1997  . Pneumonia    X 1  . Postoperative hematoma involving circulatory system following cardiac catheterization 09/17/2015  . S/P cardiac cath 09/16/15 09/17/2015  . Type II diabetes mellitus ([place])   . 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 Venous insufficiency    Past Surgical History:  Procedure Laterality Date  . CARDIAC CATHETERIZATION  1997; 2008; 09/16/2015  . CARDIAC CATHETERIZATION N/A 09/16/2015   Procedure: Right/Left Heart Cath and Coronary/Graft Angiography;  Surgeon: Lorretta Harp, MD;  Location: [facility];  Service: Cardiovascular;  Laterality: N/A;  . CESAREAN SECTION  1966  . COLONOSCOPY    . CORONARY ARTERY BYPASS GRAFT  1997   CABG X5  . EYE SURGERY     2 laser surgeries on left eye with implant  . EYE SURGERY Bilateral    cataracts  . FEMUR IM NAIL Left 08/28/2018   Procedure:  INTRAMEDULLARY (IM) NAIL FEMORAL;  Surgeon: Rod Can, MD;  Location: [facility];  Service: Orthopedics;  Laterality: Left;  . FRACTURE SURGERY    . HERNIA REPAIR    . LAPAROSCOPIC ASSISTED VENTRAL HERNIA REPAIR  09/2008    with incarcerated colon;  Dr. Lucia Gaskins  . MOHS SURGERY  06/2016   [facility]  . PATELLA FRACTURE SURGERY Left 11/1994   "crushed my knee"; put in a plate & 6 screws"  . POLYPECTOMY    . REFRACTIVE SURGERY Left 07/30/2015  . 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 March Rummage  . SHOULDER ARTHROSCOPY W/ ROTATOR CUFF REPAIR Right 1997  . SMALL INTESTINE SURGERY    . TONSILLECTOMY  1960s  . TOTAL KNEE ARTHROPLASTY WITH HARDWARE REMOVAL Left 12/1994    (infex, hardware removed )  . TUBAL LIGATION  1966     Current Meds  Medication Sig  . amLODipine (NORVASC) 5 MG tablet TAKE 1 TABLET(5 MG) BY MOUTH DAILY  . aspirin 81 MG chewable tablet Chew 1 tablet (81 mg total) by mouth 2 (two)  times daily with a meal.  . atenolol (TENORMIN) 100 MG tablet Take 1 tablet (100 mg total) by mouth daily.  Marland Kitchen atorvastatin (LIPITOR) 20 MG tablet TAKE 1 TABLET(20 MG) BY MOUTH DAILY  . azelastine (ASTELIN) 0.1 % nasal spray Place 2 sprays into both nostrils at bedtime as needed for rhinitis. Use in each nostril as directed  . Blood Glucose Monitoring Suppl (ONE TOUCH ULTRA SYSTEM KIT) W/DEVICE KIT 1 kit by Does not apply route once.  . cholecalciferol (VITAMIN D) 1000 UNITS tablet Take 1,000 Units by mouth daily.    . clopidogrel (PLAVIX) 75 MG tablet Take 1 tablet (75 mg total) by mouth daily.  . clorazepate (TRANXENE) 7.5 MG tablet Take 1 tablet (7.5 mg total) by mouth as needed.  . fluticasone (FLONASE) 50 MCG/ACT nasal spray SHAKE LIQUID AND USE 2 SPRAYS IN EACH NOSTRIL DAILY (Patient taking differently: Place 2 sprays into both nostrils daily as needed for allergies. )  . glucose blood (ONETOUCH ULTRA) test strip Use Onetouch Ultra test strips as instructed to check blood sugar twice daily.  . insulin NPH Human (HUMULIN N,NOVOLIN N) 100 UNIT/ML injection Inject 38 units into the skin every morning and inject 24 units into the skin at bedtime.  . insulin regular (NOVOLIN R,HUMULIN R) 100 units/mL injection Inject 16 Units into the skin 2 (two) times daily before a meal.   . Insulin Syringe-Needle U-100 (INSULIN SYRINGE .5CC/31GX5/16") 31G X 5/16" 0.5 ML MISC USE TO INJECT INSULIN 5 TIMES PER DAY  . isosorbide mononitrate (IMDUR) 30 MG 24 hr tablet TAKE 1 TABLET BY MOUTH EVERY DAY  . metFORMIN (GLUCOPHAGE) 500 MG tablet Take 1 tablet (500 mg total) by mouth daily with supper.  . mometasone (ELOCON) 0.1 % cream Apply 1 application topically daily. Use as directed  . Multiple Vitamin (MULTIVITAMIN) capsule Take 1 capsule by mouth daily.    . nitroGLYCERIN (NITROSTAT) 0.3 MG SL tablet Place 1 tablet (0.3 mg total) under the tongue every 5 (five) minutes as needed for chest pain (ER if no better  after 3 tablets).  Marland Kitchen omeprazole (PRILOSEC) 20 MG capsule TAKE 1 CAPSULE(20 MG) BY MOUTH DAILY (Patient taking differently: every other day. )  . VICTOZA 18 MG/3ML SOPN ADMINISTER 1.2 MG UNDER THE SKIN DAILY     Allergies:   Codeine, Ibuprofen, Meperidine hcl, and Naproxen sodium   Social History   Tobacco Use  .  Smoking status: Never Smoker  . Smokeless tobacco: Never Used  Vaping Use  . Vaping Use: Never used  Substance Use Topics  . Alcohol use: No    Alcohol/week: 0.0 standard drinks  . Drug use: No     Family Hx: The patient's family history includes Diabetes in her child; Emphysema (age of onset: 54) in her brother; Heart attack in her child; Heart disease in her maternal aunt and maternal uncle; Heart disease (age of onset: 44) in her father; Heart disease (age of onset: 61) in her mother; Hypertension in her child. There is no history of Colon cancer, Breast cancer, Rectal cancer, or Stomach cancer.  ROS:   Please see the history of present illness.     All other systems reviewed and are negative.   Labs/Other Tests and Data Reviewed:    Recent Labs: 05/02/2019: Pro B Natriuretic peptide (BNP) 281.0 07/08/2019: Hemoglobin 11.5; Platelet Count 190 07/15/2019: ALT 17; BUN 28; Creatinine, Ser 1.48; Potassium 4.6; Sodium 140   Recent Lipid Panel Lab Results  Component Value Date/Time   CHOL 116 11/25/2018 11:15 AM   TRIG 150.0 (H) 11/25/2018 11:15 AM   HDL 36.90 (L) 11/25/2018 11:15 AM   CHOLHDL 3 11/25/2018 11:15 AM   LDLCALC 49 11/25/2018 11:15 AM   LDLDIRECT 36.0 05/26/2016 09:46 AM    Wt Readings from Last 3 Encounters:  09/12/19 186 lb 2 oz (84.4 kg)  08/25/19 186 lb 9.6 oz (84.6 kg)  07/17/19 188 lb 6.4 oz (85.5 kg)     Exam:    Vital Signs:  There were no vitals taken for this visit.  Patient unable to obtain.  Well nourished, well developed female in no  acute distress.   ASSESSMENT & PLAN:    1.  DOE/shortness of breath -no increased DOE or  activity intolerance today. One episode of PND 10/17/19 lasted 1 min and has not had any further episodes.   Last seen by Dr. Gwenlyn Found on 05/21/2019 she had undergone stress testing and echocardiogram which showed reassuring results.  She was referred back to her PCP for referral to pulmonology and to explore noncardiac causes of her discomfort. Continue to monitor  Coronary artery disease-no chest pain today.  Underwent nuclear stress test 05/15/2019 which showed no ischemia and low risk, and an EF of 62%. Continue amlodipine, atenolol, atorvastatin, Plavix, aspirin, Imdur, nitroglycerin Heart healthy low-sodium diet-salty 6 given Increase physical activity as tolerated  Essential hypertension-BP today unavaliable today.  Well-controlled at home. Continue amlodipine, atenolol, Imdur Heart healthy low-sodium diet-salty 6 given Increase physical activity as tolerated  Hyperlipidemia-LDL 49 on 11/25/2018 Continue atorvastatin Heart healthy low-sodium high-fiber diet Increase physical activity as tolerated  Disposition: Follow-up with Dr. Gwenlyn Found or me in 6 months.   COVID-19 Education: The signs and symptoms of COVID-19 were discussed with the patient and how to seek care for testing (follow up with PCP or arrange E-visit).  The importance of social distancing was discussed today.  Patient Risk:   After full review of this patients clinical status, I feel that they are at least moderate risk at this time.  Time:   Today, I have spent 15 minutes with the patient with telehealth technology discussing PND, hypertension, coronary artery disease, and cardiac medications.  I spent greater than 20 minutes prior to this visit reviewing the patient's past medical history, past cardiac tests, and previous cardiology notes.   Medication Adjustments/Labs and Tests Ordered: Current medicines are reviewed at length with the patient today.  Concerns regarding medicines are outlined above.   Tests  Ordered: No orders of the defined types were placed in this encounter.  Medication Changes: No orders of the defined types were placed in this encounter.   Disposition:  in 6 month(s)  Signed, Jossie Ng. Bless Lisenby NP-C    08/06/2018 11:58 AM    [address] Office [phone] Fax [phone]

## 2019-10-23 ENCOUNTER — Telehealth: Payer: Self-pay | Admitting: Pharmacist

## 2019-10-23 ENCOUNTER — Other Ambulatory Visit: Payer: Self-pay | Admitting: Endocrinology

## 2019-10-23 NOTE — Progress Notes (Addendum)
Chronic Care Management Pharmacy Assistant   Name: [name]  MRN: [id number] DOB: 1940/07/12  Reason for Encounter: Medication Review/ Patient Assistance Coordination   PCP : Sara Branch, MD  Allergies:   Allergies  Allergen Reactions   Codeine     REACTION: makes her nervous, orTylenol #3   Ibuprofen     REACTION: nervous   Meperidine Hcl     REACTION: nasuea and vomitting   Naproxen Sodium     REACTION: nervous    Medications: Outpatient Encounter Medications as of 10/23/2019  Medication Sig Note   amLODipine (NORVASC) 5 MG tablet TAKE 1 TABLET(5 MG) BY MOUTH DAILY    aspirin 81 MG chewable tablet Chew 1 tablet (81 mg total) by mouth 2 (two) times daily with a meal.    atenolol (TENORMIN) 100 MG tablet Take 1 tablet (100 mg total) by mouth daily.    atorvastatin (LIPITOR) 20 MG tablet TAKE 1 TABLET(20 MG) BY MOUTH DAILY    azelastine (ASTELIN) 0.1 % nasal spray Place 2 sprays into both nostrils at bedtime as needed for rhinitis. Use in each nostril as directed 10/20/2019: Hasn't used in a while; usually uses at night   Blood Glucose Monitoring Suppl (ONE TOUCH ULTRA SYSTEM KIT) W/DEVICE KIT 1 kit by Does not apply route once.    cholecalciferol (VITAMIN D) 1000 UNITS tablet Take 1,000 Units by mouth daily.      clopidogrel (PLAVIX) 75 MG tablet Take 1 tablet (75 mg total) by mouth daily.    clorazepate (TRANXENE) 7.5 MG tablet Take 1 tablet (7.5 mg total) by mouth as needed. 10/20/2019: Very seldom takes   fluticasone (FLONASE) 50 MCG/ACT nasal spray SHAKE LIQUID AND USE 2 SPRAYS IN EACH NOSTRIL DAILY (Patient taking differently: Place 2 sprays into both nostrils daily as needed for allergies. )    glucose blood (ONETOUCH ULTRA) test strip Use Onetouch Ultra test strips as instructed to check blood sugar twice daily.    insulin NPH Human (HUMULIN N,NOVOLIN N) 100 UNIT/ML injection Inject 38 units into the skin every morning and inject 24 units into the skin at  bedtime.    insulin regular (NOVOLIN R,HUMULIN R) 100 units/mL injection Inject 16 Units into the skin 2 (two) times daily before a meal.     Insulin Syringe-Needle U-100 (INSULIN SYRINGE .5CC/31GX5/16") 31G X 5/16" 0.5 ML MISC USE TO INJECT INSULIN 5 TIMES PER DAY    isosorbide mononitrate (IMDUR) 30 MG 24 hr tablet TAKE 1 TABLET BY MOUTH EVERY DAY    metFORMIN (GLUCOPHAGE) 500 MG tablet Take 1 tablet (500 mg total) by mouth daily with supper.    mometasone (ELOCON) 0.1 % cream Apply 1 application topically daily. Use as directed    Multiple Vitamin (MULTIVITAMIN) capsule Take 1 capsule by mouth daily.      nitroGLYCERIN (NITROSTAT) 0.3 MG SL tablet Place 1 tablet (0.3 mg total) under the tongue every 5 (five) minutes as needed for chest pain (ER if no better after 3 tablets). (Patient not taking: Reported on 09/12/2019) 10/01/2018: PRN   omeprazole (PRILOSEC) 20 MG capsule TAKE 1 CAPSULE(20 MG) BY MOUTH DAILY    VICTOZA 18 MG/3ML SOPN ADMINISTER 1.2 MG UNDER THE SKIN DAILY    No facility-administered encounter medications on file as of 10/23/2019.    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   Age-related osteoporosis with current pathological fracture 10/01/2018  Intertrochanteric fracture of left femur, closed, initial encounter ([place]) 08/27/2018   Acute kidney injury superimposed on chronic kidney disease ([place]) 08/27/2018   CAD (coronary artery disease)    Chest pain 04/19/2017   Iron deficiency anemia 08/30/2016   Shortness of breath 04/19/2016   Positive cardiac stress test    PCP NOTES >>>>>>>>>>>>>>>>>>> 12/02/2014   Annual physical exam  08/30/2014   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   CLL (chronic lymphocytic leukemia) ([place]) 02/25/2014   Chronic kidney disease, stage III (moderate) (HCC) 09/18/2013   Anxiety and depression 10/03/2010   IBS (irritable bowel syndrome)  09/01/2010   Vitamin B deficiency 05/20/2009   HERNIA, VENTRAL 08/27/2008   GASTRITIS, HX OF 08/27/2008   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  Spoke with [name] states form was not received.  Called patient to inform forms was not received about her medications.  Patient states she has not received any forms.  Confirmed address.  Follow-Up:  Patient Assistance Coordination and Pharmacist Review   CThailandShannon, RLymanPrimary care at MBayamonPharmacist Assistant [phone] Reviewed by: KDe Blanch PharmD Clinical Pharmacist LButler BeachPrimary Care at [phone]

## 2019-10-23 NOTE — Progress Notes (Addendum)
Chronic Care Management Pharmacy Assistant   Name: [name]  MRN: [id number] DOB: Jan 27, 1940  Reason for Encounter: Patient Assistance Coordination  PCP : Colon Branch, MD  Allergies:   Allergies  Allergen Reactions   Codeine     REACTION: makes her nervous, orTylenol #3   Ibuprofen     REACTION: nervous   Meperidine Hcl     REACTION: nasuea and vomitting   Naproxen Sodium     REACTION: nervous    Medications: Outpatient Encounter Medications as of 10/23/2019  Medication Sig Note   amLODipine (NORVASC) 5 MG tablet TAKE 1 TABLET(5 MG) BY MOUTH DAILY    aspirin 81 MG chewable tablet Chew 1 tablet (81 mg total) by mouth 2 (two) times daily with a meal.    atenolol (TENORMIN) 100 MG tablet Take 1 tablet (100 mg total) by mouth daily.    atorvastatin (LIPITOR) 20 MG tablet TAKE 1 TABLET(20 MG) BY MOUTH DAILY    azelastine (ASTELIN) 0.1 % nasal spray Place 2 sprays into both nostrils at bedtime as needed for rhinitis. Use in each nostril as directed 10/20/2019: Hasn't used in a while; usually uses at night   Blood Glucose Monitoring Suppl (ONE TOUCH ULTRA SYSTEM KIT) W/DEVICE KIT 1 kit by Does not apply route once.    cholecalciferol (VITAMIN D) 1000 UNITS tablet Take 1,000 Units by mouth daily.      clopidogrel (PLAVIX) 75 MG tablet Take 1 tablet (75 mg total) by mouth daily.    clorazepate (TRANXENE) 7.5 MG tablet Take 1 tablet (7.5 mg total) by mouth as needed. 10/20/2019: Very seldom takes   fluticasone (FLONASE) 50 MCG/ACT nasal spray SHAKE LIQUID AND USE 2 SPRAYS IN EACH NOSTRIL DAILY (Patient taking differently: Place 2 sprays into both nostrils daily as needed for allergies. )    glucose blood (ONETOUCH ULTRA) test strip Use Onetouch Ultra test strips as instructed to check blood sugar twice daily.    insulin NPH Human (HUMULIN N,NOVOLIN N) 100 UNIT/ML injection Inject 38 units into the skin every morning and inject 24 units into the skin at bedtime.    insulin  regular (NOVOLIN R,HUMULIN R) 100 units/mL injection Inject 16 Units into the skin 2 (two) times daily before a meal.     Insulin Syringe-Needle U-100 (INSULIN SYRINGE .5CC/31GX5/16") 31G X 5/16" 0.5 ML MISC USE TO INJECT INSULIN 5 TIMES PER DAY    isosorbide mononitrate (IMDUR) 30 MG 24 hr tablet TAKE 1 TABLET BY MOUTH EVERY DAY    metFORMIN (GLUCOPHAGE) 500 MG tablet Take 1 tablet (500 mg total) by mouth daily with supper.    mometasone (ELOCON) 0.1 % cream Apply 1 application topically daily. Use as directed    Multiple Vitamin (MULTIVITAMIN) capsule Take 1 capsule by mouth daily.      nitroGLYCERIN (NITROSTAT) 0.3 MG SL tablet Place 1 tablet (0.3 mg total) under the tongue every 5 (five) minutes as needed for chest pain (ER if no better after 3 tablets). (Patient not taking: Reported on 09/12/2019) 10/01/2018: PRN   omeprazole (PRILOSEC) 20 MG capsule TAKE 1 CAPSULE(20 MG) BY MOUTH DAILY    VICTOZA 18 MG/3ML SOPN ADMINISTER 1.2 MG UNDER THE SKIN DAILY    No facility-administered encounter medications on file as of 10/23/2019.    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   Age-related osteoporosis with current pathological fracture 10/01/2018   Intertrochanteric fracture  of left femur, closed, initial encounter ([place]) 08/27/2018   Acute kidney injury superimposed on chronic kidney disease ([place]) 08/27/2018   CAD (coronary artery disease)    Chest pain 04/19/2017   Iron deficiency anemia 08/30/2016   Shortness of breath 04/19/2016   Positive cardiac stress test    PCP NOTES >>>>>>>>>>>>>>>>>>> 12/02/2014   Annual physical exam  08/30/2014   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   CLL (chronic lymphocytic leukemia) ([place]) 02/25/2014   Chronic kidney disease, stage III (moderate) (HCC) 09/18/2013   Anxiety and depression 10/03/2010   IBS (irritable bowel syndrome) 09/01/2010   Vitamin B  deficiency 05/20/2009   HERNIA, VENTRAL 08/27/2008   GASTRITIS, HX OF 08/27/2008   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  Advised to send paperwork back to [facility] either by deliver with self envelope or take to office.  Follow-Up:  Patient Assistance Coordination and Pharmacist Review   [place] Shannon, [place] [facility] at [place] Pharmacist Assistant [phone]  Reviewed by: De Blanch, PharmD Clinical Pharmacist [place] [facility] [phone]

## 2019-10-23 NOTE — Progress Notes (Addendum)
Chronic Care Management Pharmacy Assistant   Name: [name]  MRN: [id number] DOB: 28-Oct-1940  Reason for Encounter: Patient Assistance Coordination  PCP : Colon Branch, MD  Allergies:   Allergies  Allergen Reactions   Codeine     REACTION: makes her nervous, orTylenol #3   Ibuprofen     REACTION: nervous   Meperidine Hcl     REACTION: nasuea and vomitting   Naproxen Sodium     REACTION: nervous    Medications: Outpatient Encounter Medications as of 10/23/2019  Medication Sig Note   amLODipine (NORVASC) 5 MG tablet TAKE 1 TABLET(5 MG) BY MOUTH DAILY    aspirin 81 MG chewable tablet Chew 1 tablet (81 mg total) by mouth 2 (two) times daily with a meal.    atenolol (TENORMIN) 100 MG tablet Take 1 tablet (100 mg total) by mouth daily.    atorvastatin (LIPITOR) 20 MG tablet TAKE 1 TABLET(20 MG) BY MOUTH DAILY    azelastine (ASTELIN) 0.1 % nasal spray Place 2 sprays into both nostrils at bedtime as needed for rhinitis. Use in each nostril as directed 10/20/2019: Hasn't used in a while; usually uses at night   Blood Glucose Monitoring Suppl (ONE TOUCH ULTRA SYSTEM KIT) W/DEVICE KIT 1 kit by Does not apply route once.    cholecalciferol (VITAMIN D) 1000 UNITS tablet Take 1,000 Units by mouth daily.      clopidogrel (PLAVIX) 75 MG tablet Take 1 tablet (75 mg total) by mouth daily.    clorazepate (TRANXENE) 7.5 MG tablet Take 1 tablet (7.5 mg total) by mouth as needed. 10/20/2019: Very seldom takes   fluticasone (FLONASE) 50 MCG/ACT nasal spray SHAKE LIQUID AND USE 2 SPRAYS IN EACH NOSTRIL DAILY (Patient taking differently: Place 2 sprays into both nostrils daily as needed for allergies. )    glucose blood (ONETOUCH ULTRA) test strip Use Onetouch Ultra test strips as instructed to check blood sugar twice daily.    insulin NPH Human (HUMULIN N,NOVOLIN N) 100 UNIT/ML injection Inject 38 units into the skin every morning and inject 24 units into the skin at bedtime.    insulin  regular (NOVOLIN R,HUMULIN R) 100 units/mL injection Inject 16 Units into the skin 2 (two) times daily before a meal.     Insulin Syringe-Needle U-100 (INSULIN SYRINGE .5CC/31GX5/16") 31G X 5/16" 0.5 ML MISC USE TO INJECT INSULIN 5 TIMES PER DAY    isosorbide mononitrate (IMDUR) 30 MG 24 hr tablet TAKE 1 TABLET BY MOUTH EVERY DAY    metFORMIN (GLUCOPHAGE) 500 MG tablet Take 1 tablet (500 mg total) by mouth daily with supper.    mometasone (ELOCON) 0.1 % cream Apply 1 application topically daily. Use as directed    Multiple Vitamin (MULTIVITAMIN) capsule Take 1 capsule by mouth daily.      nitroGLYCERIN (NITROSTAT) 0.3 MG SL tablet Place 1 tablet (0.3 mg total) under the tongue every 5 (five) minutes as needed for chest pain (ER if no better after 3 tablets). (Patient not taking: Reported on 09/12/2019) 10/01/2018: PRN   omeprazole (PRILOSEC) 20 MG capsule TAKE 1 CAPSULE(20 MG) BY MOUTH DAILY    VICTOZA 18 MG/3ML SOPN ADMINISTER 1.2 MG UNDER THE SKIN DAILY    No facility-administered encounter medications on file as of 10/23/2019.    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   Age-related osteoporosis with current pathological fracture 10/01/2018   Intertrochanteric  fracture of left femur, closed, initial encounter ([place]) 08/27/2018   Acute kidney injury superimposed on chronic kidney disease ([place]) 08/27/2018   CAD (coronary artery disease)    Chest pain 04/19/2017   Iron deficiency anemia 08/30/2016   Shortness of breath 04/19/2016   Positive cardiac stress test    PCP NOTES >>>>>>>>>>>>>>>>>>> 12/02/2014   Annual physical exam  08/30/2014   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   CLL (chronic lymphocytic leukemia) ([place]) 02/25/2014   Chronic kidney disease, stage III (moderate) (HCC) 09/18/2013   Anxiety and depression 10/03/2010   IBS (irritable bowel syndrome) 09/01/2010   Vitamin B  deficiency 05/20/2009   HERNIA, VENTRAL 08/27/2008   GASTRITIS, HX OF 08/27/2008   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  Novolin R 331.98 at [facility]          332.18 at [place] 331.98 at [facility]           332.18 at [facility]  Patient is aware via voicemail.  Follow-Up:  Medication Cost Review and Pharmacist Review   [place] Shannon, [place] [facility] care at [place] Pharmacist Assistant [phone]  Reviewed by: De Blanch, PharmD Clinical Pharmacist [place] [facility] at [facility] [phone]

## 2019-10-27 ENCOUNTER — Encounter: Payer: Self-pay | Admitting: General Practice

## 2019-10-27 ENCOUNTER — Telehealth (INDEPENDENT_AMBULATORY_CARE_PROVIDER_SITE_OTHER): Payer: Medicare Other | Admitting: General Practice

## 2019-10-27 DIAGNOSIS — R0609 Other forms of dyspnea: Secondary | ICD-10-CM

## 2019-10-27 DIAGNOSIS — R0600 Dyspnea, unspecified: Secondary | ICD-10-CM

## 2019-10-27 DIAGNOSIS — E785 Hyperlipidemia, unspecified: Secondary | ICD-10-CM | POA: Diagnosis not present

## 2019-10-27 DIAGNOSIS — Z951 Presence of aortocoronary bypass graft: Secondary | ICD-10-CM | POA: Diagnosis not present

## 2019-10-27 DIAGNOSIS — I10 Essential (primary) hypertension: Secondary | ICD-10-CM

## 2019-10-27 DIAGNOSIS — E1169 Type 2 diabetes mellitus with other specified complication: Secondary | ICD-10-CM

## 2019-10-27 NOTE — Patient Instructions (Addendum)
Medication Instructions:  The current medical regimen is effective;  continue present plan and medications as directed. Please refer to the Current Medication list given to you today. *If you need a refill on your cardiac medications before your next appointment, please call your pharmacy*  Lab Work:   Testing/Procedures:  NONE    NONE  Special Instructions IF YOU WAKE GASPING FOR BREATH AGAIN TAKE YOUR BLOOD PRESSURE AND CALL.  PLEASE READ AND FOLLOW SALTY 6-ATTACHED  PLEASE INCREASE PHYSICAL ACTIVITY AS TOLERATED  Follow-Up: Your next appointment:  KEEP SCHEDULED APPOINTMENT  In Person with Quay Burow, MD 01-23-2019 at [phone]  At [facility], you and your health needs are our priority.  As part of our continuing mission to provide you with exceptional heart care, we have created designated Provider Care Teams.  These Care Teams include your primary Cardiologist (physician) and Advanced Practice Providers (APPs -  Physician Assistants and Nurse Practitioners) who all work together to provide you with the care you need, when you need it.  We recommend signing up for the patient portal called "MyChart".  Sign up information is provided on this After Visit Summary.  MyChart is used to connect with patients for Virtual Visits (Telemedicine).  Patients are able to view lab/test results, encounter notes, upcoming appointments, etc.  Non-urgent messages can be sent to your provider as well.   To learn more about what you can do with MyChart, go to [email or web address].

## 2019-11-07 ENCOUNTER — Other Ambulatory Visit: Payer: Self-pay

## 2019-11-07 ENCOUNTER — Other Ambulatory Visit: Payer: Self-pay | Admitting: Endocrinology

## 2019-11-07 MED ORDER — ONETOUCH ULTRA VI STRP: ORAL_STRIP | 5 refills | Status: DC

## 2019-11-07 NOTE — Telephone Encounter (Signed)
Patient called and is requesting a refill of her One Touch test strips

## 2019-11-12 ENCOUNTER — Ambulatory Visit (INDEPENDENT_AMBULATORY_CARE_PROVIDER_SITE_OTHER): Payer: Medicare Other | Admitting: Family

## 2019-11-12 ENCOUNTER — Telehealth: Payer: Self-pay | Admitting: Family

## 2019-11-12 ENCOUNTER — Other Ambulatory Visit: Payer: Self-pay

## 2019-11-12 ENCOUNTER — Ambulatory Visit (HOSPITAL_BASED_OUTPATIENT_CLINIC_OR_DEPARTMENT_OTHER)
Admission: RE | Admit: 2019-11-12 | Discharge: 2019-11-12 | Disposition: A | Discharge status: Home / Self Care | Payer: Medicare Other | Source: Ambulatory Visit | Attending: Family | Admitting: Family

## 2019-11-12 VITALS — BP 102/60 | HR 68 | Temp 97.6°F | Wt 182.4 lb

## 2019-11-12 DIAGNOSIS — M545 Low back pain, unspecified: Secondary | ICD-10-CM | POA: Diagnosis not present

## 2019-11-12 DIAGNOSIS — I25810 Atherosclerosis of coronary artery bypass graft(s) without angina pectoris: Secondary | ICD-10-CM

## 2019-11-12 DIAGNOSIS — R109 Unspecified abdominal pain: Secondary | ICD-10-CM

## 2019-11-12 DIAGNOSIS — M7918 Myalgia, other site: Secondary | ICD-10-CM | POA: Diagnosis not present

## 2019-11-12 DIAGNOSIS — S20211A Contusion of right front wall of thorax, initial encounter: Secondary | ICD-10-CM | POA: Diagnosis not present

## 2019-11-12 DIAGNOSIS — N289 Disorder of kidney and ureter, unspecified: Secondary | ICD-10-CM | POA: Diagnosis not present

## 2019-11-12 LAB — BASIC METABOLIC PANEL WITH GFR
BUN: 23 mg/dL (ref 6–23)
CO2: 30 meq/L (ref 19–32)
Calcium: 9.6 mg/dL (ref 8.4–10.5)
Chloride: 104 meq/L (ref 96–112)
Creatinine, Ser: 1.49 mg/dL — ABNORMAL HIGH (ref 0.40–1.20)
GFR: 33.05 mL/min — ABNORMAL LOW
Glucose, Bld: 124 mg/dL — ABNORMAL HIGH (ref 70–99)
Potassium: 4.4 meq/L (ref 3.5–5.1)
Sodium: 142 meq/L (ref 135–145)

## 2019-11-12 MED ORDER — TRAMADOL HCL 50 MG PO TABS
50.0000 mg | ORAL_TABLET | Freq: Three times a day (TID) | ORAL | 0 refills | Status: AC | PRN
Start: 2019-11-12 — End: 2019-11-17

## 2019-11-12 MED ORDER — IRBESARTAN 300 MG PO TABS: 150.0000 mg | ORAL_TABLET | Freq: Every day | ORAL | Status: DC

## 2019-11-12 NOTE — Patient Instructions (Signed)
Please complete lab work prior to leaving. Complete x-rays on the first floor. You may use tramadol as needed for pain.  You can also apply an over the counter pain patch (such as salon pas) to the area of pain. Please call if symptoms worsen or if symptoms are not improved in 1 week.

## 2019-11-12 NOTE — Telephone Encounter (Signed)
Patient is interested in starting prolia.  Could you please initiate insurance approval?

## 2019-11-12 NOTE — Progress Notes (Signed)
Subjective:    Patient ID: [name], female    DOB: [birth year], 79 y.o.   MRN: [id number]  HPI  Patient is an 79 yr old female who presents today with chief complaint of back pain.  Pain began 1 week ago and is located on the right flank. Reports that 10 days ago she clipped a small bush and woke up the following day it was hurting. Reports that she went to church on Sunday but was very uncomfortable. Pain is worsened by certain movements such as turning over in bed or coughing. Hurts worse if she stands up straight. Pain is non-radiating. She denies hx of back problems.    Review of Systems    see HPI  Past Medical History:  Diagnosis Date  . Acute blood loss anemia 09/17/2015  . Allergy   . Anemia   . Anginal pain ([place]) 2017  . Anxiety   . Arthritis    "hands and knees mostly" (09/16/2015)  . B12 deficiency anemia   . CAD (coronary artery disease)    Dr Gwenlyn Found  . Cataract    bil cataracts removed  . 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 Clotting disorder ([place])   . Depression   . DJD (degenerative joint disease)   . 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 Gastritis   . GERD (gastroesophageal reflux disease)   . Headache(784.0)   . History of blood transfusion    "I"ve had 20 some; thru birth of children, loss of blood, last 2 were in ~ 1999 before my cancer surgery" (09/16/2015)  . History of hiatal hernia   . History of shingles   . Hx of colonic polyps   . Hyperlipidemia   . Hypertension   . 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 Myocardial infarction ([place])    1997  . Pneumonia    X 1  . Postoperative hematoma involving circulatory system following cardiac catheterization 09/17/2015  . S/P cardiac cath 09/16/15 09/17/2015  . Type II diabetes mellitus ([place])   . 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 Venous insufficiency      Social History   Socioeconomic  History  . Marital status: Widowed    Spouse name: Not on file  . Number of children: 2  . Years of education: Not on file  . Highest education level: Not on file  Occupational History  . Occupation: retired-- [employer]     Employer: RETIRED  Tobacco Use  . Smoking status: Never Smoker  . Smokeless tobacco: Never Used  Vaping Use  . Vaping Use: Never used  Substance and Sexual Activity  . Alcohol use: No    Alcohol/week: 0.0 standard drinks  . Drug use: No  . Sexual activity: Not Currently  Other Topics Concern  . Not on file  Social History Narrative   Lives by herself, lost husband ~ 2004 , still drives    1 child in [place]   I child in [place]   Social Determinants of Health   Financial Resource Strain: Medium Risk  . Difficulty of Paying Living Expenses: Somewhat hard  Food Insecurity:   . Worried About Charity fundraiser in the Last Year: Not on file  . Ran Out of Food in the Last Year: Not on file  Transportation Needs:   . Lack of Transportation (Medical): Not on file  . Lack of Transportation (  Non-Medical): Not on file  Physical Activity:   . Days of Exercise per Week: Not on file  . Minutes of Exercise per Session: Not on file  Stress:   . Feeling of Stress : Not on file  Social Connections:   . Frequency of Communication with Friends and Family: Not on file  . Frequency of Social Gatherings with Friends and Family: Not on file  . Attends Religious Services: Not on file  . Active Member of Clubs or Organizations: Not on file  . Attends Archivist Meetings: Not on file  . Marital Status: Not on file  Intimate Partner Violence:   . Fear of Current or Ex-Partner: Not on file  . Emotionally Abused: Not on file  . Physically Abused: Not on file  . Sexually Abused: Not on file    Past Surgical History:  Procedure Laterality Date  . CARDIAC CATHETERIZATION  1997; 2008; 09/16/2015  . CARDIAC CATHETERIZATION N/A 09/16/2015   Procedure: Right/Left Heart  Cath and Coronary/Graft Angiography;  Surgeon: Lorretta Harp, MD;  Location: [facility];  Service: Cardiovascular;  Laterality: N/A;  . CESAREAN SECTION  1966  . COLONOSCOPY    . CORONARY ARTERY BYPASS GRAFT  1997   CABG X5  . EYE SURGERY     2 laser surgeries on left eye with implant  . EYE SURGERY Bilateral    cataracts  . FEMUR IM NAIL Left 08/28/2018   Procedure: INTRAMEDULLARY (IM) NAIL FEMORAL;  Surgeon: Rod Can, MD;  Location: [facility];  Service: Orthopedics;  Laterality: Left;  . FRACTURE SURGERY    . HERNIA REPAIR    . LAPAROSCOPIC ASSISTED VENTRAL HERNIA REPAIR  09/2008    with incarcerated colon;  Dr. Lucia Gaskins  . MOHS SURGERY  06/2016   BCC  . PATELLA FRACTURE SURGERY Left 11/1994   "crushed my knee"; put in a plate & 6 screws"  . POLYPECTOMY    . REFRACTIVE SURGERY Left 07/30/2015  . 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 March Rummage  . SHOULDER ARTHROSCOPY W/ ROTATOR CUFF REPAIR Right 1997  . SMALL INTESTINE SURGERY    . TONSILLECTOMY  1960s  . TOTAL KNEE ARTHROPLASTY WITH HARDWARE REMOVAL Left 12/1994    (infex, hardware removed )  . TUBAL LIGATION  1966    Family History  Problem Relation Age of Onset  . Heart disease Mother 82  . Heart disease Father 40       MI  . Hypertension Child   . Heart attack Child   . Diabetes Child   . Heart disease Maternal Aunt        x 2, all deceased  . Heart disease Maternal Uncle        x 4, all deceased  . Emphysema Brother 84  . Colon cancer Neg Hx   . Breast cancer Neg Hx   . Rectal cancer Neg Hx   . Stomach cancer Neg Hx     Allergies  Allergen Reactions  . Codeine     REACTION: makes her nervous, orTylenol #3  . Ibuprofen     REACTION: nervous  . Meperidine Hcl     REACTION: nasuea and vomitting  . Naproxen Sodium     REACTION: nervous    Current Outpatient Medications on File Prior to Visit  Medication Sig Dispense Refill  . amLODipine (NORVASC) 5 MG tablet TAKE 1 TABLET(5 MG) BY  MOUTH DAILY 90 tablet 0  . aspirin 81 MG  chewable tablet Chew 1 tablet (81 mg total) by mouth 2 (two) times daily with a meal. 60 tablet 1  . atenolol (TENORMIN) 100 MG tablet Take 1 tablet (100 mg total) by mouth daily. 90 tablet 1  . atorvastatin (LIPITOR) 20 MG tablet TAKE 1 TABLET(20 MG) BY MOUTH DAILY 90 tablet 3  . azelastine (ASTELIN) 0.1 % nasal spray Place 2 sprays into both nostrils at bedtime as needed for rhinitis. Use in each nostril as directed 30 mL 1  . Blood Glucose Monitoring Suppl (ONE TOUCH ULTRA SYSTEM KIT) W/DEVICE KIT 1 kit by Does not apply route once. 1 each 0  . cholecalciferol (VITAMIN D) 1000 UNITS tablet Take 1,000 Units by mouth daily.      . clopidogrel (PLAVIX) 75 MG tablet Take 1 tablet (75 mg total) by mouth daily. 90 tablet 3  . clorazepate (TRANXENE) 7.5 MG tablet Take 1 tablet (7.5 mg total) by mouth as needed. 30 tablet 1  . fluticasone (FLONASE) 50 MCG/ACT nasal spray SHAKE LIQUID AND USE 2 SPRAYS IN EACH NOSTRIL DAILY (Patient taking differently: Place 2 sprays into both nostrils daily as needed for allergies. ) 48 g 3  . glucose blood (ONETOUCH ULTRA) test strip USE TO TEST BLOOD SUGAR TWICE DAILY Dx code E11.65 100 strip 5  . insulin NPH Human (HUMULIN N,NOVOLIN N) 100 UNIT/ML injection Inject 38 units into the skin every morning and inject 24 units into the skin at bedtime.    . insulin regular (NOVOLIN R,HUMULIN R) 100 units/mL injection Inject 16 Units into the skin 2 (two) times daily before a meal.     . Insulin Syringe-Needle U-100 (INSULIN SYRINGE .5CC/31GX5/16") 31G X 5/16" 0.5 ML MISC USE TO INJECT INSULIN 5 TIMES PER DAY 200 each 0  . isosorbide mononitrate (IMDUR) 30 MG 24 hr tablet TAKE 1 TABLET BY MOUTH EVERY DAY 90 tablet 3  . metFORMIN (GLUCOPHAGE) 500 MG tablet Take 1 tablet (500 mg total) by mouth daily with supper. 90 tablet 3  . mometasone (ELOCON) 0.1 % cream Apply 1 application topically daily. Use as directed    . Multiple Vitamin  (MULTIVITAMIN) capsule Take 1 capsule by mouth daily.      . nitroGLYCERIN (NITROSTAT) 0.3 MG SL tablet Place 1 tablet (0.3 mg total) under the tongue every 5 (five) minutes as needed for chest pain (ER if no better after 3 tablets). 30 tablet 1  . omeprazole (PRILOSEC) 20 MG capsule TAKE 1 CAPSULE(20 MG) BY MOUTH DAILY (Patient taking differently: every other day. ) 90 capsule 3  . VICTOZA 18 MG/3ML SOPN ADMINISTER 1.2 MG UNDER THE SKIN DAILY 6 mL 2   No current facility-administered medications on file prior to visit.    BP 102/60 (BP Location: Left Arm, Patient Position: Sitting, Cuff Size: Normal)   Pulse 68   Temp 97.6 F (36.4 C) (Temporal)   Wt 182 lb 6.4 oz (82.7 kg)   SpO2 98%   BMI 29.44 kg/m    Objective:   Physical Exam Constitutional:      Appearance: She is well-developed.  Neck:     Thyroid: No thyromegaly.  Cardiovascular:     Rate and Rhythm: Normal rate and regular rhythm.     Heart sounds: Normal heart sounds. No murmur heard.   Pulmonary:     Effort: Pulmonary effort is normal. No respiratory distress.     Breath sounds: Normal breath sounds. No wheezing.  Musculoskeletal:       Arms:  Cervical back: Neck supple.     Comments: Tender area of ecchymosis noted right lower flank.  Skin:    General: Skin is warm and dry.  Neurological:     Mental Status: She is alert and oriented to person, place, and time.  Psychiatric:        Behavior: Behavior normal.        Thought Content: Thought content normal.        Judgment: Judgment normal.           Assessment & Plan:  Musculoskeletal pain- she has bruising in the area of pain although she does not remember any trauma to this area.  Obtain an x-ray of the ribs which was negative for fracture.  Will avoid NSAIDS due to renal insufficiency. Tylenol is not helping her pain. Will rx with prn tramadol.  Also recommended application of prn salon pas patches otc. She is advised to call if symptoms worsen or  if symptoms are not improved in 1 week.  Renal insufficiency- check BMET.    This visit occurred during the SARS-CoV-2 public health emergency.  Safety protocols were in place, including screening questions prior to the visit, additional usage of staff PPE, and extensive cleaning of exam room while observing appropriate contact time as indicated for disinfecting solutions.

## 2019-11-13 NOTE — Telephone Encounter (Signed)
Spoke with patient. She will owe $0. She would like to have done at visit with Larose Kells in December. Ok to do.

## 2019-11-14 ENCOUNTER — Telehealth: Payer: Self-pay | Admitting: Family

## 2019-11-14 NOTE — Telephone Encounter (Signed)
Please advise pt that I am sorry she is not improving.  I would recommend that she schedule follow up with Dr. Larose Kells.

## 2019-11-14 NOTE — Telephone Encounter (Signed)
Dr. Larose Kells- FYI, see below.

## 2019-11-14 NOTE — Telephone Encounter (Signed)
Ok to wait till December

## 2019-11-17 ENCOUNTER — Other Ambulatory Visit: Payer: Self-pay | Admitting: Cardiovascular Disease

## 2019-11-17 NOTE — Telephone Encounter (Signed)
LMOM informing to schedule appt w/ PCP if no better.

## 2019-11-18 ENCOUNTER — Other Ambulatory Visit: Payer: Self-pay

## 2019-11-18 ENCOUNTER — Other Ambulatory Visit (INDEPENDENT_AMBULATORY_CARE_PROVIDER_SITE_OTHER): Payer: Medicare Other

## 2019-11-18 ENCOUNTER — Ambulatory Visit: Payer: Medicare Other | Admitting: Endocrinology

## 2019-11-18 DIAGNOSIS — Z794 Long term (current) use of insulin: Secondary | ICD-10-CM

## 2019-11-18 DIAGNOSIS — Z23 Encounter for immunization: Secondary | ICD-10-CM | POA: Diagnosis not present

## 2019-11-18 DIAGNOSIS — E1165 Type 2 diabetes mellitus with hyperglycemia: Secondary | ICD-10-CM | POA: Diagnosis not present

## 2019-11-18 LAB — COMPREHENSIVE METABOLIC PANEL
ALT: 18 U/L (ref 0–35)
AST: 24 U/L (ref 0–37)
Albumin: 4.1 g/dL (ref 3.5–5.2)
Alkaline Phosphatase: 84 U/L (ref 39–117)
BUN: 21 mg/dL (ref 6–23)
CO2: 30 mEq/L (ref 19–32)
Calcium: 9.8 mg/dL (ref 8.4–10.5)
Chloride: 105 mEq/L (ref 96–112)
Creatinine, Ser: 1.34 mg/dL — ABNORMAL HIGH (ref 0.40–1.20)
GFR: 37.53 mL/min — ABNORMAL LOW (ref >60.00)
Glucose, Bld: 63 mg/dL — ABNORMAL LOW (ref 70–99)
Potassium: 4.4 mEq/L (ref 3.5–5.1)
Sodium: 141 mEq/L (ref 135–145)
Total Bilirubin: 0.4 mg/dL (ref 0.2–1.2)
Total Protein: 7 g/dL (ref 6.0–8.3)

## 2019-11-18 LAB — MICROALBUMIN / CREATININE URINE RATIO
Creatinine,U: 30.8 mg/dL
Microalb Creat Ratio: 24.2 mg/g (ref 0.0–30.0)
Microalb, Ur: 7.5 mg/dL — ABNORMAL HIGH (ref 0.0–1.9)

## 2019-11-18 LAB — HEMOGLOBIN A1C: Hgb A1c MFr Bld: 6.9 % — ABNORMAL HIGH (ref 4.6–6.5)

## 2019-11-24 DIAGNOSIS — E10319 Type 1 diabetes mellitus with unspecified diabetic retinopathy without macular edema: Secondary | ICD-10-CM | POA: Diagnosis not present

## 2019-11-24 LAB — HM DIABETES EYE EXAM

## 2019-11-25 ENCOUNTER — Telehealth: Payer: Self-pay | Admitting: Endocrinology

## 2019-11-25 ENCOUNTER — Other Ambulatory Visit: Payer: Self-pay

## 2019-11-25 ENCOUNTER — Ambulatory Visit (INDEPENDENT_AMBULATORY_CARE_PROVIDER_SITE_OTHER): Payer: Medicare Other | Admitting: Endocrinology

## 2019-11-25 ENCOUNTER — Telehealth: Payer: Self-pay | Admitting: *Deleted

## 2019-11-25 VITALS — BP 124/60 | HR 61 | Ht 66.0 in | Wt 179.0 lb

## 2019-11-25 DIAGNOSIS — E1169 Type 2 diabetes mellitus with other specified complication: Secondary | ICD-10-CM

## 2019-11-25 DIAGNOSIS — N1831 Chronic kidney disease, stage 3a: Secondary | ICD-10-CM

## 2019-11-25 DIAGNOSIS — Z794 Long term (current) use of insulin: Secondary | ICD-10-CM | POA: Diagnosis not present

## 2019-11-25 DIAGNOSIS — I25810 Atherosclerosis of coronary artery bypass graft(s) without angina pectoris: Secondary | ICD-10-CM

## 2019-11-25 DIAGNOSIS — E785 Hyperlipidemia, unspecified: Secondary | ICD-10-CM

## 2019-11-25 DIAGNOSIS — E1165 Type 2 diabetes mellitus with hyperglycemia: Secondary | ICD-10-CM

## 2019-11-25 DIAGNOSIS — I10 Essential (primary) hypertension: Secondary | ICD-10-CM

## 2019-11-25 MED ORDER — OMEPRAZOLE 20 MG PO CPDR
DELAYED_RELEASE_CAPSULE | ORAL | 3 refills | Status: DC
Start: 2019-11-25 — End: 2020-12-20

## 2019-11-25 NOTE — Telephone Encounter (Signed)
Forgot to tell her to reduce her morning regular insulin to 12 units since her lab glucose was 63 at 11 AM, please call

## 2019-11-25 NOTE — Progress Notes (Signed)
Patient ID: [name], female   DOB: Jan 21, 1940, 79 y.o.   MRN: [id number]            Reason for Appointment: Followup for Type 2 Diabetes   History of Present Illness:          Diagnosis: Type 2 diabetes mellitus, date of diagnosis: 1998       Past history:   She was treated with metformin at diagnosis when this had been continued until 2015 Subsequently Amaryl was also added several years ago and this has been continued Her blood sugars were under fair control between 2010 and early 2013 with A1c ranging from 7.4-9.4, mostly under 8% However since 08/2011 her A1c has been mostly over 8%  Insulin was added in 2014 with sma limited bottom ll doses of Lantus and this has been progressively increased She was started on mealtime insulin on her initial consultation in 9/15 because of high postprandial readings, sometimes over 300 With adding Victoza in 02/2014 her blood sugars were somewhat better with A1c coming down below 8%  Recent history:   INSULIN regimen: Regular 14 units before meals twice daily.  NPH 38 units in the morning and 24 hs  Non-insulin hypoglycemic drugs the patient is taking are: Victoza 1.2 mg daily.    Current blood sugar patterns, daily management and problems identified:   Her A1c is 6.9, previously 8% in July    Her blood sugars at home are recently appearing to be overall fairly good with some variability  As before she is not forgetting when she is checking her blood sugars and usually only in the morning and close to bedtime  She may have had a slightly low blood sugar in the 60s more than a month ago at night but was asymptomatic  She is not checking her glucose after breakfast and is taking the same dose of regular insulin both with breakfast and dinner  At breakfast she is mostly eating the yogurt  Positive for blood sugars fluctuate some after dinner but she is not clear why her blood sugar last night was 259  Not clear if she may be  occasionally forgetting her mealtime insulin  Usually in the morning she takes regular insulin 30-minute before eating  Her weight appears to have declined recently    Welcome: She has continued to take 1.2 mg daily without any side effects      Dinner is usually at 6-7 pm Bfst 10 am  Glucose monitoring:  done 1-2 times a day         Glucometer: One Touch ultra 2.       Blood Glucose readings by time of day from download for the last 2 weeks:   PRE-MEAL Fasting Lunch Dinner Bedtime Overall  Glucose range: 104-184      Mean/median:  135    141   POST-MEAL PC Breakfast PC Lunch PC Dinner  Glucose range:    77-259  Mean/median:    148   Prior:  PRE-MEAL Fasting Lunch Dinner Bedtime Overall  Glucose range:  77-176  110, 151   125-224   Mean/median:  130   156 141   POST-MEAL PC Breakfast PC Lunch PC Dinner  Glucose range:   ?  Mean/median:        Self-care: The diet that the patient has been following is: none, usually eating low fat meals Meals: 2-3 meals per day          Dietician visit, most  recent: never.  She saw the CDE in 02/2014               Weight history:  Wt Readings from Last 3 Encounters:  11/25/19 179 lb (81.2 kg)  11/12/19 182 lb 6.4 oz (82.7 kg)  09/12/19 186 lb 2 oz (84.4 kg)    Glycemic control:   Lab Results  Component Value Date   HGBA1C 6.9 (H) 11/18/2019   HGBA1C 8.0 (H) 07/15/2019   HGBA1C 6.5 03/06/2019   Lab Results  Component Value Date   MICROALBUR 7.5 (H) 11/18/2019   LDLCALC 49 11/25/2018   CREATININE 1.34 (H) 11/18/2019     OTHER active problems  discussed in review of systems   Allergies as of 11/25/2019      Reactions   Codeine    REACTION: makes her nervous, orTylenol #3   Ibuprofen    REACTION: nervous   Meperidine Hcl    REACTION: nasuea and vomitting   Naproxen Sodium    REACTION: nervous      Medication List       Accurate as of November 25, 2019  1:53 PM. If you have any questions, ask your nurse or  doctor.        amLODipine 5 MG tablet Commonly known as: NORVASC TAKE 1 TABLET(5 MG) BY MOUTH DAILY   aspirin 81 MG chewable tablet Chew 1 tablet (81 mg total) by mouth 2 (two) times daily with a meal.   atenolol 100 MG tablet Commonly known as: TENORMIN Take 1 tablet (100 mg total) by mouth daily.   atorvastatin 20 MG tablet Commonly known as: LIPITOR TAKE 1 TABLET(20 MG) BY MOUTH DAILY   azelastine 0.1 % nasal spray Commonly known as: ASTELIN Place 2 sprays into both nostrils at bedtime as needed for rhinitis. Use in each nostril as directed   cholecalciferol 1000 units tablet Commonly known as: VITAMIN D Take 1,000 Units by mouth daily.   clopidogrel 75 MG tablet Commonly known as: PLAVIX Take 1 tablet (75 mg total) by mouth daily.   clorazepate 7.5 MG tablet Commonly known as: TRANXENE Take 1 tablet (7.5 mg total) by mouth as needed.   fluticasone 50 MCG/ACT nasal spray Commonly known as: FLONASE SHAKE LIQUID AND USE 2 SPRAYS IN EACH NOSTRIL DAILY What changed: See the new instructions.   insulin NPH Human 100 UNIT/ML injection Commonly known as: NOVOLIN N Inject 38 units into the skin every morning and inject 24 units into the skin at bedtime.   insulin regular 100 units/mL injection Commonly known as: NOVOLIN R Inject 16 Units into the skin 2 (two) times daily before a meal.   INSULIN SYRINGE .5CC/31GX5/16" 31G X 5/16" 0.5 ML Misc USE TO INJECT INSULIN 5 TIMES PER DAY   irbesartan 300 MG tablet Commonly known as: Avapro Take 0.5 tablets (150 mg total) by mouth daily.   isosorbide mononitrate 30 MG 24 hr tablet Commonly known as: IMDUR TAKE 1 TABLET BY MOUTH EVERY DAY   metFORMIN 500 MG tablet Commonly known as: GLUCOPHAGE Take 1 tablet (500 mg total) by mouth daily with supper.   mometasone 0.1 % cream Commonly known as: ELOCON Apply 1 application topically daily. Use as directed   multivitamin capsule Take 1 capsule by mouth daily.    nitroGLYCERIN 0.3 MG SL tablet Commonly known as: Nitrostat Place 1 tablet (0.3 mg total) under the tongue every 5 (five) minutes as needed for chest pain (ER if no better after 3 tablets).  omeprazole 20 MG capsule Commonly known as: PRILOSEC TAKE 1 CAPSULE(20 MG) BY MOUTH DAILY What changed: See the new instructions.   ONE TOUCH ULTRA SYSTEM KIT w/Device Kit 1 kit by Does not apply route once.   OneTouch Ultra test strip Generic drug: glucose blood USE TO TEST BLOOD SUGAR TWICE DAILY Dx code E11.65   Victoza 18 MG/3ML Sopn Generic drug: liraglutide ADMINISTER 1.2 MG UNDER THE SKIN DAILY       Allergies:  Allergies  Allergen Reactions  . Codeine     REACTION: makes her nervous, orTylenol #3  . Ibuprofen     REACTION: nervous  . Meperidine Hcl     REACTION: nasuea and vomitting  . Naproxen Sodium     REACTION: nervous    Past Medical History:  Diagnosis Date  . Acute blood loss anemia 09/17/2015  . Allergy   . Anemia   . Anginal pain ([place]) 2017  . Anxiety   . Arthritis    "hands and knees mostly" (09/16/2015)  . B12 deficiency anemia   . CAD (coronary artery disease)    Dr Gwenlyn Found  . Cataract    bil cataracts removed  . CLL (chronic lymphocytic leukemia) ([place]) 02/25/2014  . Clotting disorder ([place])   . Depression   . DJD (degenerative joint disease)   . 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 Gastritis   . GERD (gastroesophageal reflux disease)   . Headache(784.0)   . History of blood transfusion    "I"ve had 20 some; thru birth of children, loss of blood, last 2 were in ~ 1999 before my cancer surgery" (09/16/2015)  . History of hiatal hernia   . History of shingles   . Hx of colonic polyps   . Hyperlipidemia   . Hypertension   . 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 Myocardial infarction ([place])    1997  . Pneumonia    X 1  . Postoperative hematoma involving circulatory system following cardiac  catheterization 09/17/2015  . S/P cardiac cath 09/16/15 09/17/2015  . Type II diabetes mellitus ([place])   . 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[facility])    as a child  . Venous insufficiency     Past Surgical History:  Procedure Laterality Date  . CARDIAC CATHETERIZATION  1997; 2008; 09/16/2015  . CARDIAC CATHETERIZATION N/A 09/16/2015   Procedure: Right/Left Heart Cath and Coronary/Graft Angiography;  Surgeon: Lorretta Harp, MD;  Location: [facility];  Service: Cardiovascular;  Laterality: N/A;  . CESAREAN SECTION  1966  . COLONOSCOPY    . CORONARY ARTERY BYPASS GRAFT  1997   CABG X5  . EYE SURGERY     2 laser surgeries on left eye with implant  . EYE SURGERY Bilateral    cataracts  . FEMUR IM NAIL Left 08/28/2018   Procedure: INTRAMEDULLARY (IM) NAIL FEMORAL;  Surgeon: Rod Can, MD;  [facility];  Service: Orthopedics;  Laterality: Left;  . FRACTURE SURGERY    . HERNIA REPAIR    . LAPAROSCOPIC ASSISTED VENTRAL HERNIA REPAIR  09/2008    with incarcerated colon;  Dr. Lucia Gaskins  . MOHS SURGERY  06/2016   BCC  . PATELLA FRACTURE SURGERY Left 11/1994   "crushed my knee"; put in a plate & 6 screws"  . POLYPECTOMY    . REFRACTIVE SURGERY Left 07/30/2015  . 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 March Rummage  . SHOULDER ARTHROSCOPY W/  ROTATOR CUFF REPAIR Right 1997  . SMALL INTESTINE SURGERY    . TONSILLECTOMY  1960s  . TOTAL KNEE ARTHROPLASTY WITH HARDWARE REMOVAL Left 12/1994    (infex, hardware removed )  . TUBAL LIGATION  1966    Family History  Problem Relation Age of Onset  . Heart disease Mother 52  . Heart disease Father 13       MI  . Hypertension Child   . Heart attack Child   . Diabetes Child   . Heart disease Maternal Aunt        x 2, all deceased  . Heart disease Maternal Uncle        x 4, all deceased  . Emphysema Brother 31  . Colon cancer Neg Hx   . Breast cancer Neg Hx   . Rectal cancer Neg Hx   . Stomach cancer Neg Hx      Social History:  reports that she has never smoked. She has never used smokeless tobacco. She reports that she does not drink alcohol and does not use drugs.    Review of Systems         Lipids: Has had excellent control of hypercholesterolemia with long-term use of Lipitor She has a history of coronary bypass surgery.  Last labs as follows:       Lab Results  Component Value Date   CHOL 116 11/25/2018   HDL 36.90 (L) 11/25/2018   LDLCALC 49 11/25/2018   LDLDIRECT 36.0 05/26/2016   TRIG 150.0 (H) 11/25/2018   CHOLHDL 3 11/25/2018                  The blood pressure has been treated with atenolol 100 mg by her PCP and also 5 mg amlodipine prescribed here    BP Readings from Last 3 Encounters:  11/25/19 124/60  11/12/19 102/60  09/12/19 (!) 141/68     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 creatinine has been fluctuating  She does have a high microalbumin level in the past Normal in 12/20     Lab Results  Component Value Date   CREATININE 1.34 (H) 11/18/2019   CREATININE 1.49 (H) 11/12/2019   CREATININE 1.48 (H) 07/15/2019       No history of Numbness, tingling or burning in feet   Diabetic foot exam was in 5/19 showing mild neuropathy  History of CLL: Under the care of hematologist every 6 months, labs as follows  Lab Results  Component Value Date   WBC 65.0 (HH) 07/08/2019   Lab Results  Component Value Date   HGB 11.5 (L) 07/08/2019       Physical Examination:  BP 124/60   Pulse 61   Ht 5' 6"  (1.676 m)   Wt 179 lb (81.2 kg)   SpO2 96%   BMI 28.89 kg/m     Diabetes type 2, insulin requiring    See history of present illness for detailed discussion of her current blood sugar patterns, management and problems identified  Her A1c is back down to 6.9, previously 8%  She has benefited from adding Metformin and increasing her insulin in August At home her blood sugars are excellent with average 141 although mostly checking fasting and late evening only No  hypoglycemia also As before she prefers to take generic insulin Usually appears to be consistent with taking her regular insulin 30-minute before eating but may have forgotten it once or twice with readings as high as 259 PC Also unable to adjust her morning  regular insulin because of lack of postprandial monitoring; Lab glucose was 63  RENAL insufficiency: Her creatinine is variable but recently better Also microalbumin is now consistently normal  HYPERTENSION: Blood pressure is well controlled  May consider reducing amlodipine if blood pressure tends to be low normal persistently   PLAN:   She will try to check more readings after breakfast  In the meantime we will reduce her morning regular insulin to 12 units  May not need to check fasting readings every day  Continue Victoza unchanged  There are no Patient Instructions on file for this visit.   Elayne Snare 11/25/2019, 1:53 PM   Note: This office note was prepared with Dragon voice recognition system technology. Any transcriptional errors that result from this process are unintentional.

## 2019-11-25 NOTE — Telephone Encounter (Signed)
Request from pharmacy for omeprazole 20 mg refills, Sent in electronically patient will need a follow up appointment

## 2019-11-25 NOTE — Telephone Encounter (Signed)
Detailed message left on patient's vm.

## 2019-11-25 NOTE — Patient Instructions (Signed)
Check blood sugars on waking up 3-4 days a week  Also check blood sugars about 2 hours after meals and do this after different meals by rotation  Recommended blood sugar levels on waking up are 90-130 and about 2 hours after meal is 130-180  Please bring your blood sugar monitor to each visit, thank you  

## 2019-11-26 ENCOUNTER — Telehealth: Payer: Self-pay | Admitting: Pharmacist

## 2019-11-26 NOTE — Progress Notes (Signed)
Chronic Care Management Pharmacy Assistant   Name: [name]  MRN: [id number] DOB: 1940/12/30  Reason for Encounter: Medication Review/ General Adherence  Patient Questions:  1.  Have you seen any other providers since your last visit? Yes  2.  Any changes in your medicines or health? Yes  PCP : Colon Branch, MD  Their chronic conditions include: Hypertension, Hyperlipidemia/CAD, Diabetes, Depression/Anxiety, Osteoporosis, GERD, Allergic Rhinitis  \Allergies:   Allergies  Allergen Reactions   Codeine     REACTION: makes her nervous, orTylenol #3   Ibuprofen     REACTION: nervous   Meperidine Hcl     REACTION: nasuea and vomitting   Naproxen Sodium     REACTION: nervous    Medications: Outpatient Encounter Medications as of 11/26/2019  Medication Sig Note   amLODipine (NORVASC) 5 MG tablet TAKE 1 TABLET(5 MG) BY MOUTH DAILY    aspirin 81 MG chewable tablet Chew 1 tablet (81 mg total) by mouth 2 (two) times daily with a meal.    atenolol (TENORMIN) 100 MG tablet Take 1 tablet (100 mg total) by mouth daily.    atorvastatin (LIPITOR) 20 MG tablet TAKE 1 TABLET(20 MG) BY MOUTH DAILY    azelastine (ASTELIN) 0.1 % nasal spray Place 2 sprays into both nostrils at bedtime as needed for rhinitis. Use in each nostril as directed 10/20/2019: Hasn't used in a while; usually uses at night   Blood Glucose Monitoring Suppl (ONE TOUCH ULTRA SYSTEM KIT) W/DEVICE KIT 1 kit by Does not apply route once.    cholecalciferol (VITAMIN D) 1000 UNITS tablet Take 1,000 Units by mouth daily.      clopidogrel (PLAVIX) 75 MG tablet Take 1 tablet (75 mg total) by mouth daily.    clorazepate (TRANXENE) 7.5 MG tablet Take 1 tablet (7.5 mg total) by mouth as needed. 10/20/2019: Very seldom takes   fluticasone (FLONASE) 50 MCG/ACT nasal spray SHAKE LIQUID AND USE 2 SPRAYS IN EACH NOSTRIL DAILY (Patient taking differently: Place 2 sprays into both nostrils daily as needed for allergies.  )    glucose blood (ONETOUCH ULTRA) test strip USE TO TEST BLOOD SUGAR TWICE DAILY Dx code E11.65    insulin NPH Human (HUMULIN N,NOVOLIN N) 100 UNIT/ML injection Inject 38 units into the skin every morning and inject 24 units into the skin at bedtime.    insulin regular (NOVOLIN R,HUMULIN R) 100 units/mL injection Inject 16 Units into the skin 2 (two) times daily before a meal.     Insulin Syringe-Needle U-100 (INSULIN SYRINGE .5CC/31GX5/16") 31G X 5/16" 0.5 ML MISC USE TO INJECT INSULIN 5 TIMES PER DAY    irbesartan (AVAPRO) 300 MG tablet Take 0.5 tablets (150 mg total) by mouth daily.    isosorbide mononitrate (IMDUR) 30 MG 24 hr tablet TAKE 1 TABLET BY MOUTH EVERY DAY    metFORMIN (GLUCOPHAGE) 500 MG tablet Take 1 tablet (500 mg total) by mouth daily with supper.    mometasone (ELOCON) 0.1 % cream Apply 1 application topically daily. Use as directed    Multiple Vitamin (MULTIVITAMIN) capsule Take 1 capsule by mouth daily.      nitroGLYCERIN (NITROSTAT) 0.3 MG SL tablet Place 1 tablet (0.3 mg total) under the tongue every 5 (five) minutes as needed for chest pain (ER if no better after 3 tablets). 10/01/2018: PRN   omeprazole (PRILOSEC) 20 MG capsule TAKE 1 CAPSULE(20 MG) BY MOUTH DAILY    VICTOZA 18 MG/3ML SOPN ADMINISTER 1.2 MG UNDER THE  SKIN DAILY    No facility-administered encounter medications on file as of 11/26/2019.    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   Age-related osteoporosis with current pathological fracture 10/01/2018   Intertrochanteric fracture of left femur, closed, initial encounter ([place]) 08/27/2018   Acute kidney injury superimposed on chronic kidney disease ([place]) 08/27/2018   CAD (coronary artery disease)    Chest pain 04/19/2017   Iron deficiency anemia 08/30/2016   Shortness of breath 04/19/2016   Positive cardiac stress test    PCP NOTES  >>>>>>>>>>>>>>>>>>> 12/02/2014   Annual physical exam  08/30/2014   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   CLL (chronic lymphocytic leukemia) ([place]) 02/25/2014   Chronic kidney disease, stage III (moderate) (HCC) 09/18/2013   Anxiety and depression 10/03/2010   IBS (irritable bowel syndrome) 09/01/2010   Vitamin B deficiency 05/20/2009   HERNIA, VENTRAL 08/27/2008   GASTRITIS, HX OF 08/27/2008   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  11-25-19 Patient was presented in office with Elayne Snare from [facility] endocrinology for a follow up on diabetes.  Provider recommended patient reduce her morning regular inulin to 12 units.  Reports receiving the PAP information.  States she has not filled the paperwork out.  States she will look over it and get it filled out.  Follow-Up:  Pharmacist Review   [place] Shannon, [place] [facility] [place] Pharmacist Assistant [phone]

## 2019-12-04 ENCOUNTER — Other Ambulatory Visit: Payer: Self-pay | Admitting: Endocrinology

## 2019-12-15 ENCOUNTER — Other Ambulatory Visit: Payer: Self-pay

## 2019-12-15 ENCOUNTER — Encounter: Payer: Self-pay | Admitting: Internal Medicine

## 2019-12-15 ENCOUNTER — Ambulatory Visit (INDEPENDENT_AMBULATORY_CARE_PROVIDER_SITE_OTHER): Payer: Medicare Other | Admitting: Internal Medicine

## 2019-12-15 VITALS — BP 154/72 | HR 68 | Temp 97.8°F | Ht 66.0 in | Wt 182.0 lb

## 2019-12-15 DIAGNOSIS — M810 Age-related osteoporosis without current pathological fracture: Secondary | ICD-10-CM | POA: Diagnosis not present

## 2019-12-15 DIAGNOSIS — I25810 Atherosclerosis of coronary artery bypass graft(s) without angina pectoris: Secondary | ICD-10-CM

## 2019-12-15 DIAGNOSIS — E1169 Type 2 diabetes mellitus with other specified complication: Secondary | ICD-10-CM | POA: Diagnosis not present

## 2019-12-15 DIAGNOSIS — E785 Hyperlipidemia, unspecified: Secondary | ICD-10-CM

## 2019-12-15 MED ORDER — DENOSUMAB 60 MG/ML ~~LOC~~ SOSY
60.0000 mg | PREFILLED_SYRINGE | Freq: Once | SUBCUTANEOUS | Status: AC
  Administered 2019-12-15: 11:00:00 60 mg via SUBCUTANEOUS

## 2019-12-15 NOTE — Progress Notes (Signed)
Subjective:    Patient ID: [name], female    DOB: 11-May-1940, 79 y.o.   MRN: [id number]  DOS:  12/15/2019 Type of visit - description: Routine checkup Since the last office visit is doing well and has no new concerns. BP today slightly elevated, reported normal ambulatory BPs. Saw cardiology, note reviewed. DOE: Declined to see pulmonary, "I am better"   Review of Systems See above   Past Medical History:  Diagnosis Date  . Acute blood loss anemia 09/17/2015  . Allergy   . Anemia   . Anginal pain ([place]) 2017  . Anxiety   . Arthritis    "hands and knees mostly" (09/16/2015)  . B12 deficiency anemia   . CAD (coronary artery disease)    Dr Gwenlyn Found  . Cataract    bil cataracts removed  . CLL (chronic lymphocytic leukemia) ([place]) 02/25/2014  . Clotting disorder ([place])   . Depression   . DJD (degenerative joint disease)   . 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 Gastritis   . GERD (gastroesophageal reflux disease)   . Headache(784.0)   . History of blood transfusion    "I"ve had 20 some; thru birth of children, loss of blood, last 2 were in ~ 1999 before my cancer surgery" (09/16/2015)  . History of hiatal hernia   . History of shingles   . Hx of colonic polyps   . Hyperlipidemia   . Hypertension   . 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 Myocardial infarction ([place])    1997  . Pneumonia    X 1  . Postoperative hematoma involving circulatory system following cardiac catheterization 09/17/2015  . S/P cardiac cath 09/16/15 09/17/2015  . Type II diabetes mellitus ([place])   . 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 Venous insufficiency     Past Surgical History:  Procedure Laterality Date  . CARDIAC CATHETERIZATION  1997; 2008; 09/16/2015  . CARDIAC CATHETERIZATION N/A 09/16/2015   Procedure: Right/Left Heart Cath and Coronary/Graft Angiography;  Surgeon: Lorretta Harp, MD;  Location: [facility];  Service: Cardiovascular;  Laterality: N/A;  . CESAREAN SECTION  1966  . COLONOSCOPY    . CORONARY ARTERY BYPASS GRAFT  1997   CABG X5  . EYE SURGERY     2 laser surgeries on left eye with implant  . EYE SURGERY Bilateral    cataracts  . FEMUR IM NAIL Left 08/28/2018   Procedure: INTRAMEDULLARY (IM) NAIL FEMORAL;  Surgeon: Rod Can, MD;  Location: [facility];  Service: Orthopedics;  Laterality: Left;  . FRACTURE SURGERY    . HERNIA REPAIR    . LAPAROSCOPIC ASSISTED VENTRAL HERNIA REPAIR  09/2008    with incarcerated colon;  Dr. Lucia Gaskins  . MOHS SURGERY  06/2016   BCC  . PATELLA FRACTURE SURGERY Left 11/1994   "crushed my knee"; put in a plate & 6 screws"  . POLYPECTOMY    . REFRACTIVE SURGERY Left 07/30/2015  . 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 March Rummage  . SHOULDER ARTHROSCOPY W/ ROTATOR CUFF REPAIR Right 1997  . SMALL INTESTINE SURGERY    . TONSILLECTOMY  1960s  . TOTAL KNEE ARTHROPLASTY WITH HARDWARE REMOVAL Left 12/1994    (infex, hardware removed )  . TUBAL LIGATION  1966    Allergies as of 12/15/2019      Reactions   Tramadol  Insomnia    Codeine    REACTION: makes her nervous, orTylenol #3   Ibuprofen    REACTION: nervous   Meperidine Hcl    REACTION: nasuea and vomitting   Naproxen Sodium    REACTION: nervous      Medication List       Accurate as of December 15, 2019 11:59 PM. If you have any questions, ask your nurse or doctor.        amLODipine 5 MG tablet Commonly known as: NORVASC TAKE 1 TABLET(5 MG) BY MOUTH DAILY   aspirin 81 MG chewable tablet Chew 1 tablet (81 mg total) by mouth 2 (two) times daily with a meal.   atenolol 100 MG tablet Commonly known as: TENORMIN Take 1 tablet (100 mg total) by mouth daily.   atorvastatin 20 MG tablet Commonly known as: LIPITOR TAKE 1 TABLET(20 MG) BY MOUTH DAILY   azelastine 0.1 % nasal spray Commonly known as: ASTELIN Place 2 sprays into both nostrils at bedtime as  needed for rhinitis. Use in each nostril as directed   cholecalciferol 1000 units tablet Commonly known as: VITAMIN D Take 1,000 Units by mouth daily.   clopidogrel 75 MG tablet Commonly known as: PLAVIX Take 1 tablet (75 mg total) by mouth daily.   clorazepate 7.5 MG tablet Commonly known as: TRANXENE Take 1 tablet (7.5 mg total) by mouth as needed.   fluticasone 50 MCG/ACT nasal spray Commonly known as: FLONASE SHAKE LIQUID AND USE 2 SPRAYS IN EACH NOSTRIL DAILY What changed: See the new instructions.   insulin NPH Human 100 UNIT/ML injection Commonly known as: NOVOLIN N Inject 38 units into the skin every morning and inject 24 units into the skin at bedtime.   insulin regular 100 units/mL injection Commonly known as: NOVOLIN R Inject 16 Units into the skin 2 (two) times daily before a meal.   INSULIN SYRINGE .5CC/31GX5/16" 31G X 5/16" 0.5 ML Misc USE TO INJECT INSULIN 5 TIMES PER DAY   irbesartan 300 MG tablet Commonly known as: Avapro Take 0.5 tablets (150 mg total) by mouth daily.   isosorbide mononitrate 30 MG 24 hr tablet Commonly known as: IMDUR TAKE 1 TABLET BY MOUTH EVERY DAY   metFORMIN 500 MG tablet Commonly known as: GLUCOPHAGE Take 1 tablet (500 mg total) by mouth daily with supper.   mometasone 0.1 % cream Commonly known as: ELOCON Apply 1 application topically daily. Use as directed   multivitamin capsule Take 1 capsule by mouth daily.   nitroGLYCERIN 0.3 MG SL tablet Commonly known as: Nitrostat Place 1 tablet (0.3 mg total) under the tongue every 5 (five) minutes as needed for chest pain (ER if no better after 3 tablets).   omeprazole 20 MG capsule Commonly known as: PRILOSEC TAKE 1 CAPSULE(20 MG) BY MOUTH DAILY   ONE TOUCH ULTRA SYSTEM KIT w/Device Kit 1 kit by Does not apply route once.   OneTouch Ultra test strip Generic drug: glucose blood USE TO TEST BLOOD SUGAR TWICE DAILY Dx code E11.65   Victoza 18 MG/3ML Sopn Generic drug:  liraglutide ADMINISTER 1.2 MG UNDER THE SKIN DAILY          Objective:   Physical Exam BP (!) 154/72 (BP Location: Left Arm, Patient Position: Sitting, Cuff Size: Large)   Pulse 68   Temp 97.8 F (36.6 C) (Oral)   Ht _0  (1.676 m)   Wt 182 lb (82.6 kg)   SpO2 99%   BMI 29.38 kg/m  General:  Well developed, NAD, BMI noted. HEENT:  Normocephalic . Face symmetric, atraumatic Lungs:  CTA B Normal respiratory effort, no intercostal retractions, no accessory muscle use. Heart: RRR,  no murmur.  Lower extremities: no pretibial edema bilaterally  Skin: Not pale. Not jaundice Neurologic:  alert & oriented X3.  Speech normal, gait assisted by a cane. Psych--  Cognition and judgment appear intact.  Cooperative with normal attention span and concentration.  Behavior appropriate. No anxious or depressed appearing.      Assessment       Assessment DM  Dr Dwyane Dee  +retinopathy  HTN Hyperlipidemia CRI creat ~1.4  (GFR ~ 38) Depression/anxiety: tranxene qhs prn (takes rarely) MSK: -DJD  - T score -0.8 on December 2017, h/o a foot FX d/t walking years ago (likely a stress fracture), discussed treatment 04/2016:  exercise and cont Vit d Hem/Onc: Dr Marin Olp  -CLL -iron deficiency anemia: IV iron 2018 -B12 def CAD, Dr Gwenlyn Found MI 97 >> CABG, cath 12-13-2006, myoview 2012 no ischemic CP: Stress test 08/05/2015, indeterminate risk study, + lateral ischemia: Cardiac catheterization 09/16/2015: Rx medical Venous insuff GI:  Dr Silverio Decamp ---GERD, IBS, h/o Gastritis ---H/o ulcerative colitis as a child H/o shingles  BCC Dr Allyson Sabal, bx 04-2015, MOH's 06-2016   PLAN DM: Last visit with Endo 11/25/2019 HTN:BP slightly elevated today, reports normal ambulatory BPs, continue amlodipine, Tenormin, avapro, Imdur.  Monitor BPs at home Hyperlipidemia: Check FLP.  On Lipitor CRI: Since last visit, renal US 09-2019: No obstruction, bilateral cortical thinning and a small renal cysts.  Last  creatinine stable Osteoporosis: First doses of Prolia today CAD: Saw cardiology 10/27/2019: As far as DOE, they recommend consider pulmonary eval.  Cont medical therapy for CAD. DOE: See above, declines pulmonary evaluation, reports she is better. CLL: Last visit with oncology 07-2019, next 6 months. DJD: On Tylenol as needed, recommend avoid NSAIDs. Preventive care: Had COVID vaccine x3. Had a flu shot. RTC 6 months   This visit occurred during the SARS-CoV-2 public health emergency.  Safety protocols were in place, including screening questions prior to the visit, additional usage of staff PPE, and extensive cleaning of exam room while observing appropriate contact time as indicated for disinfecting solutions.

## 2019-12-15 NOTE — Patient Instructions (Signed)
Your blood pressures are slightly up today, please check the  blood pressure at home weekly. BP GOAL is between 110/65 and  135/85. If it is consistently higher or lower, let me know    GO TO THE LAB : Get the blood work     [place], [place] back for a checkup in 6 months

## 2019-12-16 ENCOUNTER — Encounter: Payer: Self-pay | Admitting: Internal Medicine

## 2019-12-16 NOTE — Assessment & Plan Note (Addendum)
  DM: Last visit with Endo 11/25/2019 HTN:BP slightly elevated today, reports normal ambulatory BPs, continue amlodipine, Tenormin, avapro, Imdur.  Monitor BPs at home Hyperlipidemia: Check FLP.  On Lipitor CRI: Since last visit, renal US 09-2019: No obstruction, bilateral cortical thinning and a small renal cysts.  Last creatinine stable Osteoporosis: First doses of Prolia today CAD: Saw cardiology 10/27/2019: As far as DOE, they recommend consider pulmonary eval.  Cont medical therapy for CAD. DOE: See above, declines pulmonary evaluation, reports she is better. CLL: Last visit with oncology 07-2019, next 6 months. DJD: On Tylenol as needed, recommend avoid NSAIDs. Preventive care: Had COVID vaccine x3. Had a flu shot. RTC 6 months

## 2019-12-17 ENCOUNTER — Telehealth: Payer: Self-pay | Admitting: Pharmacist

## 2019-12-17 ENCOUNTER — Ambulatory Visit: Payer: Self-pay | Admitting: Pharmacist

## 2019-12-17 NOTE — Chronic Care Management (AMB) (Signed)
Patient currently scheduled for telephone CCM follow up visit on 01/21/20 at 10am.  I will be out of office that date, but can reschedule pt's visit to 01/15/20 at [place] patient to discuss change. Left VM.  If patient unavailable at that time will follow up at later time.   [place], PharmD, BCACP Clinical Pharmacist [facility] at [facility] [phone]

## 2019-12-17 NOTE — Progress Notes (Addendum)
Chronic Care Management Pharmacy Assistant   Name: [name]  MRN: [id number] DOB: Mar 13, 1940  Reason for Encounter: HTN Disease State  Patient Questions:  1.  Have you seen any other providers since your last visit? Yes  2.  Any changes in your medicines or health? No    PCP : Colon Branch, MD   Their chronic conditions include: Hypertension, Hyperlipidemia/CAD, Diabetes, Depression/Anxiety, Osteoporosis, GERD, Allergic Rhinitis.  Office Visit: 12-15-2019 (PCP) Patient presented in the office for routine checkup. Patient reports doing well with no concerns. She did receive her Prolia 60 mg injection in the office. BP was noted as slightly elevated and and was advised to check her BP at home weekly. Lab work was completed. No medication changes.  Consults: None since their last CCM Disease State call on 11-26-2019.  Allergies:   Allergies  Allergen Reactions   Tramadol     Insomnia    Codeine     REACTION: makes her nervous, orTylenol #3   Ibuprofen     REACTION: nervous   Meperidine Hcl     REACTION: nasuea and vomitting   Naproxen Sodium     REACTION: nervous    Medications: Outpatient Encounter Medications as of 12/17/2019  Medication Sig Note   amLODipine (NORVASC) 5 MG tablet TAKE 1 TABLET(5 MG) BY MOUTH DAILY    aspirin 81 MG chewable tablet Chew 1 tablet (81 mg total) by mouth 2 (two) times daily with a meal.    atenolol (TENORMIN) 100 MG tablet Take 1 tablet (100 mg total) by mouth daily.    atorvastatin (LIPITOR) 20 MG tablet TAKE 1 TABLET(20 MG) BY MOUTH DAILY    azelastine (ASTELIN) 0.1 % nasal spray Place 2 sprays into both nostrils at bedtime as needed for rhinitis. Use in each nostril as directed 10/20/2019: Hasn't used in a while; usually uses at night   Blood Glucose Monitoring Suppl (ONE TOUCH ULTRA SYSTEM KIT) W/DEVICE KIT 1 kit by Does not apply route once.    cholecalciferol (VITAMIN D) 1000 UNITS tablet Take 1,000 Units by mouth daily.     clopidogrel (PLAVIX) 75 MG tablet Take 1 tablet (75 mg total) by mouth daily.    clorazepate (TRANXENE) 7.5 MG tablet Take 1 tablet (7.5 mg total) by mouth as needed. 10/20/2019: Very seldom takes   fluticasone (FLONASE) 50 MCG/ACT nasal spray SHAKE LIQUID AND USE 2 SPRAYS IN EACH NOSTRIL DAILY (Patient taking differently: Place 2 sprays into both nostrils daily as needed for allergies.)    glucose blood (ONETOUCH ULTRA) test strip USE TO TEST BLOOD SUGAR TWICE DAILY Dx code E11.65    insulin NPH Human (HUMULIN N,NOVOLIN N) 100 UNIT/ML injection Inject 38 units into the skin every morning and inject 24 units into the skin at bedtime.    insulin regular (NOVOLIN R,HUMULIN R) 100 units/mL injection Inject 16 Units into the skin 2 (two) times daily before a meal.     Insulin Syringe-Needle U-100 (INSULIN SYRINGE .5CC/31GX5/16") 31G X 5/16" 0.5 ML MISC USE TO INJECT INSULIN 5 TIMES PER DAY    irbesartan (AVAPRO) 300 MG tablet Take 0.5 tablets (150 mg total) by mouth daily.    isosorbide mononitrate (IMDUR) 30 MG 24 hr tablet TAKE 1 TABLET BY MOUTH EVERY DAY    metFORMIN (GLUCOPHAGE) 500 MG tablet Take 1 tablet (500 mg total) by mouth daily with supper.    mometasone (ELOCON) 0.1 % cream Apply 1 application topically daily. Use as directed  Multiple Vitamin (MULTIVITAMIN) capsule Take 1 capsule by mouth daily.    nitroGLYCERIN (NITROSTAT) 0.3 MG SL tablet Place 1 tablet (0.3 mg total) under the tongue every 5 (five) minutes as needed for chest pain (ER if no better after 3 tablets). 10/01/2018: PRN   omeprazole (PRILOSEC) 20 MG capsule TAKE 1 CAPSULE(20 MG) BY MOUTH DAILY    VICTOZA 18 MG/3ML SOPN ADMINISTER 1.2 MG UNDER THE SKIN DAILY    No facility-administered encounter medications on file as of 12/17/2019.    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   Age-related osteoporosis with current pathological  fracture 10/01/2018   Intertrochanteric fracture of left femur, closed, initial encounter ([place]) 08/27/2018   Acute kidney injury superimposed on chronic kidney disease ([place]) 08/27/2018   CAD (coronary artery disease)    Chest pain 04/19/2017   Iron deficiency anemia 08/30/2016   Shortness of breath 04/19/2016   Positive cardiac stress test    PCP NOTES >>>>>>>>>>>>>>>>>>> 12/02/2014   Annual physical exam  08/30/2014   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   CLL (chronic lymphocytic leukemia) ([place]) 02/25/2014   Chronic kidney disease, stage III (moderate) (HCC) 09/18/2013   Anxiety and depression 10/03/2010   IBS (irritable bowel syndrome) 09/01/2010   Vitamin B deficiency 05/20/2009   HERNIA, VENTRAL 08/27/2008   GASTRITIS, HX OF 08/27/2008   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 Spoke with patient regarding BP  Recent Office Vitals: BP Readings from Last 3 Encounters:  12/15/19 (!) 154/72  11/25/19 124/60  11/12/19 102/60   Pulse Readings from Last 3 Encounters:  12/15/19 68  11/25/19 61  11/12/19 68    Wt Readings from Last 3 Encounters:  12/15/19 182 lb (82.6 kg)  11/25/19 179 lb (81.2 kg)  11/12/19 182 lb 6.4 oz (82.7 kg)     Kidney Function Lab Results  Component Value Date/Time   CREATININE 1.34 (H) 11/18/2019 11:06 AM   CREATININE 1.49 (H) 11/12/2019 12:22 PM   CREATININE 1.69 (H) 07/08/2019 02:23 PM   CREATININE 1.31 (H) 01/07/2019 10:29 AM   CREATININE 1.6 (H) 11/03/2016 10:18  AM   CREATININE 1.5 (H) 10/12/2016 01:46 PM   CREATININE 1.4 (H) 08/30/2016 09:54 AM   CREATININE 1.6 (H) 08/30/2015 10:54 AM   GFR 37.53 (L) 11/18/2019 11:06 AM   GFRNONAA 28 (L) 07/08/2019 02:23 PM   GFRNONAA 39 (L) 09/21/2015 12:41 PM   GFRAA 33 (L) 07/08/2019 02:23 PM   GFRAA 45 (L) 09/21/2015 12:41 PM    BMP Latest Ref Rng & Units 11/18/2019 11/12/2019 07/15/2019  Glucose 70 - 99 mg/dL 63(L) 124(H) 170(H)  BUN 6 - 23 mg/dL 21 23 28(H)  Creatinine 0.40 - 1.20 mg/dL 1.34(H) 1.49(H) 1.48(H)  Sodium 135 - 145 mEq/L 141 142 140  Potassium 3.5 - 5.1 mEq/L 4.4 4.4 4.6  Chloride 96 - 112 mEq/L 105 104 106  CO2 19 - 32 mEq/L 30 30 27   Calcium 8.4 -  10.5 mg/dL 9.8 9.6 9.2    Current antihypertensive regimen:  Amlodipine 5 mg daily AM Atenolol 100 mg daily AM Irbesartan 300 mg 0.5 tabs daily PM (not listed in med list or dispense report; pt states she doesn't recall ever being stopped)  How often are you checking your Blood Pressure? Patient reports not checking her BP at home  Current home BP readings: none available  What recent interventions/DTPs have been made by any provider to improve Blood Pressure control since last CPP Visit: Dr. Larose Kells advised to check her BP at home weekly due to slightly elevated BP at the time of her OV.   Any recent hospitalizations or ED visits since last visit with CPP? No   What diet changes have been made to improve Blood Pressure Control?  None  What exercise is being done to improve your Blood Pressure Control?  Patient unable to move around like she use to prior to breaking her left hip.  Confirmed new appointment of January 13th at 10:00 am over the telephone with the clinical pharmacist. She also stated she is no longer taking Fosamax because she is getting Prolia injections. States she prefers to go to the doctors office for routine check ups or acute issues. Patient is unclear of the purpose of CCM program. Explained to her the benefits of the  program and the goal in assisting her with managing her chronic conditions. Patient states she had a visit with Dr. Larose Kells and everything was fine. I reviewed the visit with the patient bringing attention to he slightly elevated BP reading at the time of her visit, and reiterating the goal of CCM program with wanting to assure the medication she is taking is appropriate. Patient wanted to review each of her medications and what she was taking them for. Patient spoke to me about her feeling of fatigue. She reports not having as much energy as she use to.  Adherence Review: Is the patient currently on ACE/ARB medication? Yes Irbesartan 300 mg 0.5 tabs daily PM Does the patient have >5 day gap between last estimated fill dates? Yes ??? Unsure where patient is getting medication. Not listed in dispense report. Will need to confirm at follow up visit -[facility]   Follow-Up:  Pharmacist Review   Fanny Skates, [place] Pharmacist Assistant [phone]  9 minutes spent in review, coordination, and documentation.   Reviewed by: De Blanch, PharmD, BCACP Clinical Pharmacist [facility] [phone]

## 2019-12-30 DIAGNOSIS — H35432 Paving stone degeneration of retina, left eye: Secondary | ICD-10-CM | POA: Diagnosis not present

## 2019-12-30 DIAGNOSIS — E113312 Type 2 diabetes mellitus with moderate nonproliferative diabetic retinopathy with macular edema, left eye: Secondary | ICD-10-CM | POA: Diagnosis not present

## 2019-12-30 DIAGNOSIS — Q141 Congenital malformation of retina: Secondary | ICD-10-CM | POA: Diagnosis not present

## 2019-12-30 DIAGNOSIS — E113391 Type 2 diabetes mellitus with moderate nonproliferative diabetic retinopathy without macular edema, right eye: Secondary | ICD-10-CM | POA: Diagnosis not present

## 2020-01-09 ENCOUNTER — Telehealth: Payer: Self-pay | Admitting: *Deleted

## 2020-01-09 ENCOUNTER — Inpatient Hospital Stay: Payer: Medicare Other

## 2020-01-09 ENCOUNTER — Encounter: Payer: Self-pay | Admitting: Family

## 2020-01-09 ENCOUNTER — Inpatient Hospital Stay: Payer: Medicare Other | Attending: Family | Admitting: Family

## 2020-01-09 ENCOUNTER — Telehealth: Payer: Self-pay | Admitting: Internal Medicine

## 2020-01-09 ENCOUNTER — Other Ambulatory Visit: Payer: Self-pay

## 2020-01-09 VITALS — BP 106/48 | HR 62 | Temp 97.8°F | Resp 16 | Ht 66.0 in | Wt 175.8 lb

## 2020-01-09 DIAGNOSIS — D509 Iron deficiency anemia, unspecified: Secondary | ICD-10-CM | POA: Insufficient documentation

## 2020-01-09 DIAGNOSIS — C9110 Chronic lymphocytic leukemia of B-cell type not having achieved remission: Secondary | ICD-10-CM

## 2020-01-09 DIAGNOSIS — K909 Intestinal malabsorption, unspecified: Secondary | ICD-10-CM

## 2020-01-09 DIAGNOSIS — C9190 Lymphoid leukemia, unspecified not having achieved remission: Secondary | ICD-10-CM | POA: Diagnosis not present

## 2020-01-09 DIAGNOSIS — D508 Other iron deficiency anemias: Secondary | ICD-10-CM | POA: Diagnosis not present

## 2020-01-09 DIAGNOSIS — Z85048 Personal history of other malignant neoplasm of rectum, rectosigmoid junction, and anus: Secondary | ICD-10-CM | POA: Diagnosis not present

## 2020-01-09 DIAGNOSIS — Z79899 Other long term (current) drug therapy: Secondary | ICD-10-CM | POA: Insufficient documentation

## 2020-01-09 DIAGNOSIS — R5383 Other fatigue: Secondary | ICD-10-CM | POA: Diagnosis not present

## 2020-01-09 LAB — CBC WITH DIFFERENTIAL (CANCER CENTER ONLY)
Abs Immature Granulocytes: 0.1 10*3/uL — ABNORMAL HIGH (ref 0.00–0.07)
Basophils Absolute: 0.2 10*3/uL — ABNORMAL HIGH (ref 0.0–0.1)
Basophils Relative: 0 %
Eosinophils Absolute: 0.3 10*3/uL (ref 0.0–0.5)
Eosinophils Relative: 0 %
HCT: 37.4 % (ref 36.0–46.0)
Hemoglobin: 11.9 g/dL — ABNORMAL LOW (ref 12.0–15.0)
Immature Granulocytes: 0 %
Lymphocytes Relative: 78 %
Lymphs Abs: 56 10*3/uL — ABNORMAL HIGH (ref 0.7–4.0)
MCH: 30.1 pg (ref 26.0–34.0)
MCHC: 31.8 g/dL (ref 30.0–36.0)
MCV: 94.7 fL (ref 80.0–100.0)
Monocytes Absolute: 12.3 10*3/uL — ABNORMAL HIGH (ref 0.1–1.0)
Monocytes Relative: 17 %
Neutro Abs: 3.3 10*3/uL (ref 1.7–7.7)
Neutrophils Relative %: 5 %
Platelet Count: 198 10*3/uL (ref 150–400)
RBC: 3.95 MIL/uL (ref 3.87–5.11)
RDW: 13.9 % (ref 11.5–15.5)
WBC Count: 72.3 10*3/uL (ref 4.0–10.5)
nRBC: 0 % (ref 0.0–0.2)

## 2020-01-09 LAB — CMP (CANCER CENTER ONLY)
ALT: 20 U/L (ref 0–44)
AST: 32 U/L (ref 15–41)
Albumin: 4 g/dL (ref 3.5–5.0)
Alkaline Phosphatase: 81 U/L (ref 38–126)
Anion gap: 11 (ref 5–15)
BUN: 32 mg/dL — ABNORMAL HIGH (ref 8–23)
CO2: 25 mmol/L (ref 22–32)
Calcium: 9.1 mg/dL (ref 8.9–10.3)
Chloride: 104 mmol/L (ref 98–111)
Creatinine: 1.92 mg/dL — ABNORMAL HIGH (ref 0.44–1.00)
GFR, Estimated: 26 mL/min — ABNORMAL LOW (ref >60)
Glucose, Bld: 76 mg/dL (ref 70–99)
Potassium: 4.9 mmol/L (ref 3.5–5.1)
Sodium: 140 mmol/L (ref 135–145)
Total Bilirubin: 0.6 mg/dL (ref 0.3–1.2)
Total Protein: 6.9 g/dL (ref 6.5–8.1)

## 2020-01-09 LAB — LACTATE DEHYDROGENASE: LDH: 182 U/L (ref 98–192)

## 2020-01-09 LAB — SAVE SMEAR(SSMR), FOR PROVIDER SLIDE REVIEW

## 2020-01-09 MED ORDER — AZELASTINE HCL 0.1 % NA SOLN: 2.0000 | Freq: Every evening | NASAL | 12 refills | Status: DC | PRN

## 2020-01-09 NOTE — Progress Notes (Signed)
Hematology and Oncology Follow Up Visit  Saline [id number] 01-03-1941 80 y.o. 01/09/2020   Principle Diagnosis:  CLL -stage A Remote history of colon cancer Iron deficiency anemia  Current Therapy: IV iron as indicated   Interim History:  [name] is here today for follow-up. She is doing well but notes fatigue at times.  No fever, chills, n/v, cough, rash, dizziness, chest pain, palpitations, abdominal pain or changes in bowel or bladder habits.  She has mild SOB occasionally with over exertion and will take a break to rest if needed.  WBC count is 72, lymphocytes 78%. Hgb 11.9, MCV 94 and platelets 198.  No swelling, tenderness, numbness or tingling in her extremities at this time.  No hot flashes or night sweats.  No falls or syncope.  No episodes of blood loss. No bruising or petechiae.  She has maintained a good appetite and is staying well hydrated. Her weight is stable at 175 lbs.   ECOG Performance Status: 1 - Symptomatic but completely ambulatory  Medications:  Allergies as of 01/09/2020      Reactions   Tramadol    Insomnia    Codeine    REACTION: makes her nervous, orTylenol #3   Ibuprofen    REACTION: nervous   Meperidine Hcl    REACTION: nasuea and vomitting   Naproxen Sodium    REACTION: nervous      Medication List       Accurate as of January 09, 2020  2:24 PM. If you have any questions, ask your nurse or doctor.        amLODipine 5 MG tablet Commonly known as: NORVASC TAKE 1 TABLET(5 MG) BY MOUTH DAILY   aspirin 81 MG chewable tablet Chew 1 tablet (81 mg total) by mouth 2 (two) times daily with a meal.   atenolol 100 MG tablet Commonly known as: TENORMIN Take 1 tablet (100 mg total) by mouth daily.   atorvastatin 20 MG tablet Commonly known as: LIPITOR TAKE 1 TABLET(20 MG) BY MOUTH DAILY   azelastine 0.1 % nasal spray Commonly known as: ASTELIN Place 2 sprays into both nostrils at bedtime as needed for rhinitis or  allergies. Use in each nostril as directed What changed: reasons to take this Changed by: Kathlene November, MD   cholecalciferol 1000 units tablet Commonly known as: VITAMIN D Take 1,000 Units by mouth daily.   clopidogrel 75 MG tablet Commonly known as: PLAVIX Take 1 tablet (75 mg total) by mouth daily.   clorazepate 7.5 MG tablet Commonly known as: TRANXENE Take 1 tablet (7.5 mg total) by mouth as needed.   fluticasone 50 MCG/ACT nasal spray Commonly known as: FLONASE SHAKE LIQUID AND USE 2 SPRAYS IN EACH NOSTRIL DAILY What changed: See the new instructions.   insulin NPH Human 100 UNIT/ML injection Commonly known as: NOVOLIN N Inject 38 units into the skin Myers morning and inject 24 units into the skin at bedtime.   insulin regular 100 units/mL injection Commonly known as: NOVOLIN R Inject 16 Units into the skin 2 (two) times daily before a meal.   INSULIN SYRINGE .5CC/31GX5/16" 31G X 5/16" 0.5 ML Misc USE TO INJECT INSULIN 5 TIMES PER DAY   irbesartan 300 MG tablet Commonly known as: Avapro Take 0.5 tablets (150 mg total) by mouth daily.   isosorbide mononitrate 30 MG 24 hr tablet Commonly known as: IMDUR TAKE 1 TABLET BY MOUTH Myers DAY   metFORMIN 500 MG tablet Commonly known as: GLUCOPHAGE Take 1  tablet (500 mg total) by mouth daily with supper.   mometasone 0.1 % cream Commonly known as: ELOCON Apply 1 application topically daily. Use as directed   multivitamin capsule Take 1 capsule by mouth daily.   nitroGLYCERIN 0.3 MG SL tablet Commonly known as: Nitrostat Place 1 tablet (0.3 mg total) under the tongue Myers 5 (five) minutes as needed for chest pain (ER if no better after 3 tablets).   omeprazole 20 MG capsule Commonly known as: PRILOSEC TAKE 1 CAPSULE(20 MG) BY MOUTH DAILY   ONE TOUCH ULTRA SYSTEM KIT w/Device Kit 1 kit by Does not apply route once.   OneTouch Ultra test strip Generic drug: glucose blood USE TO TEST BLOOD SUGAR TWICE DAILY Dx  code E11.65   Victoza 18 MG/3ML Sopn Generic drug: liraglutide ADMINISTER 1.2 MG UNDER THE SKIN DAILY       Allergies:  Allergies  Allergen Reactions  . Tramadol     Insomnia   . Codeine     REACTION: makes her nervous, orTylenol #3  . Ibuprofen     REACTION: nervous  . Meperidine Hcl     REACTION: nasuea and vomitting  . Naproxen Sodium     REACTION: nervous    Past Medical History, Surgical history, Social history, and Family History were reviewed and updated.  Review of Systems: All other 10 point review of systems is negative.   Physical Exam:  height is 5' 6"  (1.676 m) and weight is 175 lb 12.8 oz (79.7 kg). Her oral temperature is 97.8 F (36.6 C). Her blood pressure is 106/48 (abnormal) and her pulse is 62. Her respiration is 16 and oxygen saturation is 98%.   Wt Readings from Last 3 Encounters:  01/09/20 175 lb 12.8 oz (79.7 kg)  12/15/19 182 lb (82.6 kg)  11/25/19 179 lb (81.2 kg)    Ocular: Sclerae unicteric, pupils equal, round and reactive to light Ear-nose-throat: Oropharynx clear, dentition fair Lymphatic: No cervical or supraclavicular adenopathy Lungs no rales or rhonchi, good excursion bilaterally Heart regular rate and rhythm, no murmur appreciated Abd soft, nontender, positive bowel sounds MSK no focal spinal tenderness, no joint edema Neuro: non-focal, well-oriented, appropriate affect Breasts: Deferred   Lab Results  Component Value Date   WBC 72.3 (HH) 01/09/2020   HGB 11.9 (L) 01/09/2020   HCT 37.4 01/09/2020   MCV 94.7 01/09/2020   PLT 198 01/09/2020   Lab Results  Component Value Date   FERRITIN 36 01/07/2019   IRON 66 01/07/2019   TIBC 361 01/07/2019   UIBC 295 01/07/2019   IRONPCTSAT 18 (L) 01/07/2019   Lab Results  Component Value Date   RBC 3.95 01/09/2020   Lab Results  Component Value Date   KAPLAMBRATIO 1.22 11/03/2016   Lab Results  Component Value Date   IGGSERUM 800 07/08/2019   IGA 187 07/08/2019    IGMSERUM 30 07/08/2019   Lab Results  Component Value Date   TOTALPROTELP 7.2 02/25/2014   ALBUMINELP 54.3 (L) 02/25/2014   A1GS 4.4 02/25/2014   A2GS 13.7 (H) 02/25/2014   BETS 7.0 02/25/2014   BETA2SER 6.2 02/25/2014   GAMS 14.4 02/25/2014   MSPIKE Not Observed 11/03/2016   SPEI * 02/25/2014     Chemistry      Component Value Date/Time   NA 141 11/18/2019 1106   NA 147 (H) 11/03/2016 1018   NA 143 08/30/2016 0954   K 4.4 11/18/2019 1106   K 3.9 11/03/2016 1018   K 4.5 08/30/2016  0954   CL 105 11/18/2019 1106   CL 110 (H) 11/03/2016 1018   CO2 30 11/18/2019 1106   CO2 27 11/03/2016 1018   CO2 24 08/30/2016 0954   BUN 21 11/18/2019 1106   BUN 25 (H) 11/03/2016 1018   BUN 20.5 08/30/2016 0954   CREATININE 1.34 (H) 11/18/2019 1106   CREATININE 1.69 (H) 07/08/2019 1423   CREATININE 1.6 (H) 11/03/2016 1018   CREATININE 1.4 (H) 08/30/2016 0954      Component Value Date/Time   CALCIUM 9.8 11/18/2019 1106   CALCIUM 10.1 11/03/2016 1018   CALCIUM 9.8 08/30/2016 0954   ALKPHOS 84 11/18/2019 1106   ALKPHOS 112 (H) 11/03/2016 1018   ALKPHOS 113 08/30/2016 0954   AST 24 11/18/2019 1106   AST 27 07/08/2019 1423   AST 23 08/30/2016 0954   ALT 18 11/18/2019 1106   ALT 23 07/08/2019 1423   ALT 29 11/03/2016 1018   ALT 20 08/30/2016 0954   BILITOT 0.4 11/18/2019 1106   BILITOT 0.6 07/08/2019 1423   BILITOT 0.38 08/30/2016 0954       Impression and Plan: [name] is a very pleasant 80yo caucasian female with CLL stage A. So far this has not been an issue for her.   I reviewed her blood work with Dr. Marin Olp and we will just continue to watch for now.  Follow-up in 4 months.  Iron studies are pending. We can replace if needed.  She can contact our office with any questions or concerns.   Laverna Peace, NP 1/7/20222:24 PM

## 2020-01-09 NOTE — Telephone Encounter (Signed)
Medication:azelastine (ASTELIN) 0.1 % nasal spray [[id number]]       Has the patient contacted their pharmacy? no (If no, request that the patient contact the pharmacy for the refill.) (If yes, when and what did the pharmacy advise?)    Preferred Pharmacy (with phone number or street name):  [facility], [place] - [address]  [place] [address]  Phone:  [phone] Fax:  [phone]    Agent: Please be advised that RX refills may take up to 3 business days. We ask that you follow-up with your pharmacy.

## 2020-01-09 NOTE — Telephone Encounter (Signed)
Jory Ee NP notified of [id number].  No new orders received at this time.

## 2020-01-09 NOTE — Telephone Encounter (Signed)
Rx sent 

## 2020-01-10 LAB — IGG, IGA, IGM
IgA: 195 mg/dL (ref 64–422)
IgG (Immunoglobin G), Serum: 967 mg/dL (ref 586–1602)
IgM (Immunoglobulin M), Srm: 28 mg/dL (ref 26–217)

## 2020-01-12 ENCOUNTER — Telehealth: Payer: Self-pay | Admitting: Pharmacist

## 2020-01-12 LAB — IRON AND TIBC
Iron: 108 ug/dL (ref 41–142)
Saturation Ratios: 36 % (ref 21–57)
TIBC: 296 ug/dL (ref 236–444)
UIBC: 188 ug/dL (ref 120–384)

## 2020-01-12 LAB — FERRITIN: Ferritin: 179 ng/mL (ref 11–307)

## 2020-01-12 NOTE — Progress Notes (Addendum)
Chronic Care Management Pharmacy Assistant   Name: [name]  MRN: [id number] DOB: 08-02-1940  Reason for Encounter: Patient Assistance Documentation   PCP : Colon Branch, MD  Allergies:   Allergies  Allergen Reactions   Tramadol     Insomnia    Codeine     REACTION: makes her nervous, orTylenol #3   Ibuprofen     REACTION: nervous   Meperidine Hcl     REACTION: nasuea and vomitting   Naproxen Sodium     REACTION: nervous    Medications: Outpatient Encounter Medications as of 01/12/2020  Medication Sig Note   amLODipine (NORVASC) 5 MG tablet TAKE 1 TABLET(5 MG) BY MOUTH DAILY    aspirin 81 MG chewable tablet Chew 1 tablet (81 mg total) by mouth 2 (two) times daily with a meal.    atenolol (TENORMIN) 100 MG tablet Take 1 tablet (100 mg total) by mouth daily.    atorvastatin (LIPITOR) 20 MG tablet TAKE 1 TABLET(20 MG) BY MOUTH DAILY    azelastine (ASTELIN) 0.1 % nasal spray Place 2 sprays into both nostrils at bedtime as needed for rhinitis or allergies. Use in each nostril as directed    Blood Glucose Monitoring Suppl (ONE TOUCH ULTRA SYSTEM KIT) W/DEVICE KIT 1 kit by Does not apply route once.    cholecalciferol (VITAMIN D) 1000 UNITS tablet Take 1,000 Units by mouth daily.    clopidogrel (PLAVIX) 75 MG tablet Take 1 tablet (75 mg total) by mouth daily.    clorazepate (TRANXENE) 7.5 MG tablet Take 1 tablet (7.5 mg total) by mouth as needed. 10/20/2019: Very seldom takes   fluticasone (FLONASE) 50 MCG/ACT nasal spray SHAKE LIQUID AND USE 2 SPRAYS IN EACH NOSTRIL DAILY (Patient taking differently: Place 2 sprays into both nostrils daily as needed for allergies.)    glucose blood (ONETOUCH ULTRA) test strip USE TO TEST BLOOD SUGAR TWICE DAILY Dx code E11.65    insulin NPH Human (HUMULIN N,NOVOLIN N) 100 UNIT/ML injection Inject 38 units into the skin every morning and inject 24 units into the skin at bedtime.    insulin regular (NOVOLIN R,HUMULIN R) 100 units/mL  injection Inject 16 Units into the skin 2 (two) times daily before a meal.     Insulin Syringe-Needle U-100 (INSULIN SYRINGE .5CC/31GX5/16") 31G X 5/16" 0.5 ML MISC USE TO INJECT INSULIN 5 TIMES PER DAY    irbesartan (AVAPRO) 300 MG tablet Take 0.5 tablets (150 mg total) by mouth daily.    isosorbide mononitrate (IMDUR) 30 MG 24 hr tablet TAKE 1 TABLET BY MOUTH EVERY DAY    metFORMIN (GLUCOPHAGE) 500 MG tablet Take 1 tablet (500 mg total) by mouth daily with supper.    mometasone (ELOCON) 0.1 % cream Apply 1 application topically daily. Use as directed    Multiple Vitamin (MULTIVITAMIN) capsule Take 1 capsule by mouth daily.    nitroGLYCERIN (NITROSTAT) 0.3 MG SL tablet Place 1 tablet (0.3 mg total) under the tongue every 5 (five) minutes as needed for chest pain (ER if no better after 3 tablets). 10/01/2018: PRN   omeprazole (PRILOSEC) 20 MG capsule TAKE 1 CAPSULE(20 MG) BY MOUTH DAILY    VICTOZA 18 MG/3ML SOPN ADMINISTER 1.2 MG UNDER THE SKIN DAILY    No facility-administered encounter medications on file as of 01/12/2020.    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   Age-related osteoporosis with current pathological  fracture 10/01/2018   Intertrochanteric fracture of left femur, closed, initial encounter ([place]) 08/27/2018   Acute kidney injury superimposed on chronic kidney disease ([place]) 08/27/2018   CAD (coronary artery disease)    Chest pain 04/19/2017   Iron deficiency anemia 08/30/2016   Shortness of breath 04/19/2016   Positive cardiac stress test    PCP NOTES >>>>>>>>>>>>>>>>>>> 12/02/2014   Annual physical exam  08/30/2014   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   CLL (chronic lymphocytic leukemia) ([place]) 02/25/2014   Chronic kidney disease, stage III (moderate) (HCC) 09/18/2013   Anxiety and depression 10/03/2010   IBS (irritable bowel syndrome) 09/01/2010   Vitamin B deficiency  05/20/2009   HERNIA, VENTRAL 08/27/2008   GASTRITIS, HX OF 08/27/2008   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 Patient stated she has been waiting on her tax returns to provide with her application. She did not have a copy of her 2020 return to provide before the end of the year. Advised that she provide a copy of her 2021 return with her application for 0721 for Victoza. Patient asked to cancel her appointment with the clinical pharmacist on Thursday January 13th at 10:00 am over the telephone. She did not have any urgent medication concerns and there have been no changes in medication since her last Disease State call.  Follow-Up:  Pharmacist Review   Fanny Skates, [place] Pharmacist Assistant [phone]  Noted pt's desire to cancel appt and cancelled.   3 minutes spent in review, coordination, and documentation.   Reviewed by: De Blanch, PharmD, BCACP Clinical Pharmacist [place] [facility] at [facility] [phone]

## 2020-01-15 ENCOUNTER — Telehealth: Payer: Medicare Other

## 2020-01-21 ENCOUNTER — Telehealth: Payer: Medicare Other

## 2020-01-22 ENCOUNTER — Other Ambulatory Visit: Payer: Self-pay | Admitting: Endocrinology

## 2020-01-23 ENCOUNTER — Ambulatory Visit: Payer: Medicare Other | Admitting: Cardiovascular Disease

## 2020-02-18 ENCOUNTER — Telehealth: Payer: Self-pay | Admitting: Cardiovascular Disease

## 2020-02-18 MED ORDER — IRBESARTAN 300 MG PO TABS: 150.0000 mg | ORAL_TABLET | Freq: Every day | ORAL | 2 refills | Status: DC

## 2020-02-18 NOTE — Telephone Encounter (Signed)
*  STAT* If patient is at the pharmacy, call can be transferred to refill team.   1. Which medications need to be refilled? (please list name of each medication and dose if known)   irbesartan (AVAPRO) 150 MG tablet Patient is requesting a prescription for an 150 MG tablet if possible.  She normally splits her 300 MG tablets in half.   2. Which pharmacy/location (including street and city if local pharmacy) is medication to be sent to? [facility] [place], [place]  3. Do they need a 30 day or 90 day supply? 90 day supply

## 2020-03-04 ENCOUNTER — Other Ambulatory Visit: Payer: Self-pay | Admitting: Internal Medicine

## 2020-03-17 ENCOUNTER — Encounter: Payer: Self-pay | Admitting: Cardiovascular Disease

## 2020-03-17 ENCOUNTER — Other Ambulatory Visit: Payer: Self-pay

## 2020-03-17 ENCOUNTER — Ambulatory Visit (INDEPENDENT_AMBULATORY_CARE_PROVIDER_SITE_OTHER): Payer: Medicare Other | Admitting: Cardiovascular Disease

## 2020-03-17 VITALS — BP 134/62 | HR 68 | Ht 66.0 in | Wt 182.0 lb

## 2020-03-17 DIAGNOSIS — E1169 Type 2 diabetes mellitus with other specified complication: Secondary | ICD-10-CM

## 2020-03-17 DIAGNOSIS — I25118 Atherosclerotic heart disease of native coronary artery with other forms of angina pectoris: Secondary | ICD-10-CM

## 2020-03-17 DIAGNOSIS — Z951 Presence of aortocoronary bypass graft: Secondary | ICD-10-CM

## 2020-03-17 DIAGNOSIS — E1159 Type 2 diabetes mellitus with other circulatory complications: Secondary | ICD-10-CM

## 2020-03-17 DIAGNOSIS — I152 Hypertension secondary to endocrine disorders: Secondary | ICD-10-CM

## 2020-03-17 DIAGNOSIS — E785 Hyperlipidemia, unspecified: Secondary | ICD-10-CM

## 2020-03-17 NOTE — Patient Instructions (Addendum)
Medication Instructions:  Your physician recommends that you continue on your current medications as directed. Please refer to the Current Medication list given to you today.  *If you need a refill on your cardiac medications before your next appointment, please call your pharmacy*   Follow-Up: At [facility], you and your health needs are our priority.  As part of our continuing mission to provide you with exceptional heart care, we have created designated Provider Care Teams.  These Care Teams include your primary Cardiologist (physician) and Advanced Practice Providers (APPs -  Physician Assistants and Nurse Practitioners) who all work together to provide you with the care you need, when you need it.  We recommend signing up for the patient portal called "MyChart".  Sign up information is provided on this After Visit Summary.  MyChart is used to connect with patients for Virtual Visits (Telemedicine).  Patients are able to view lab/test results, encounter notes, upcoming appointments, etc.  Non-urgent messages can be sent to your provider as well.   To learn more about what you can do with MyChart, go to [email or web address].    Your next appointment:   6 month(s)  The format for your next appointment:   In Person  Provider:   You will see one of the following Advanced Practice Providers on your designated Care Team:    Jesse Cleaver, FNP  Then, Jonathan Berry, MD will plan to see you again in 12 month(s). 

## 2020-03-17 NOTE — Progress Notes (Signed)
03/17/2020 [place]   1940/09/06  [id number]  Primary Physician Colon Branch, MD Primary Cardiologist: Lorretta Harp MD Lupe Carney, [place]  HPI:  Jenene Kauffmann Hildebrant is a 80 y.o.  mildly overweight widowed Caucasian female mother of 2 living children (son at age 85 died of an overdose), grandmother of 67 grandchildrenwho was apatient of Dr. Terance Ice.I ultimately assumed her care. I last saw her in the office 05/09/2019. Her cardiac risk factor profile is walkover treated diabetes, hypertension and hyperlipidemia. She has a strong family history of heart disease with a father who died of a myocardial infarction at age 7 and her mother at age 48. She had a heart attack in 1997 at which time I performed cardiac catheterization. Apparently I placed a balloon pump at that time and sent her to open heart surgery. Dr. Tharon Aquas Trigt performed coronary artery bypass grafting x5. She had her last catheterization performed by Dr. Rollene Fare December 2008 revealing patent grafts with normal LV function. She had a negative Myoview back in 2012. Since I saw her years ago she's had several episodes of chest pain most recent one this past Sunday which was more intense and prolonged.She had a Myoview stress test performed 08/05/15 that showed new anterolateral ischemia. She's had several episodes of chest pain since I saw her back a month ago. She underwent outpatient cardiac catheterization 09/16/15 revealing patent grafts with an unbypassed nondominant circumflex are normal LV function. Medical therapy was recommended. She really has had no significant chest pain since her heart catheterization. She saw [name] in the office 04/19/16 with atypical chest pain, dyspnea or lower extremity edema. A 2-D echo was ordered and was normal. 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 She did get iron infusion by Dr.Ennover. Her symptoms have somewhat  improved.  Since I saw her in the office back in May 2021 she continues to do well.  She did speak to Coletta Memos on the phone for a virtual telemedicine phone visit 10/27/2019 related an episode 10 days prior to that where she woke up with acute short of breath in the morning but this ultimately resolved.  She said no recurrent episodes.  She does get occasional atypical chest pain which is not changed in frequency or severity.  She did have a 2D echo and a Myoview stress test 05/12/2019 all of which were normal.   Current Meds  Medication Sig  . amLODipine (NORVASC) 5 MG tablet TAKE 1 TABLET(5 MG) BY MOUTH DAILY  . aspirin 81 MG chewable tablet Chew 1 tablet (81 mg total) by mouth 2 (two) times daily with a meal.  . atenolol (TENORMIN) 100 MG tablet TAKE 1 TABLET(100 MG) BY MOUTH DAILY  . atorvastatin (LIPITOR) 20 MG tablet TAKE 1 TABLET(20 MG) BY MOUTH DAILY  . azelastine (ASTELIN) 0.1 % nasal spray Place 2 sprays into both nostrils at bedtime as needed for rhinitis or allergies. Use in each nostril as directed  . Blood Glucose Monitoring Suppl (ONE TOUCH ULTRA SYSTEM KIT) W/DEVICE KIT 1 kit by Does not apply route once.  . cholecalciferol (VITAMIN D) 1000 UNITS tablet Take 1,000 Units by mouth daily.  . clopidogrel (PLAVIX) 75 MG tablet Take 1 tablet (75 mg total) by mouth daily.  . clorazepate (TRANXENE) 7.5 MG tablet Take 1 tablet (7.5 mg total) by mouth as needed.  . fluticasone (FLONASE) 50 MCG/ACT nasal spray SHAKE LIQUID  AND USE 2 SPRAYS IN EACH NOSTRIL DAILY (Patient taking differently: Place 2 sprays into both nostrils daily as needed for allergies.)  . glucose blood (ONETOUCH ULTRA) test strip USE TO TEST BLOOD SUGAR TWICE DAILY Dx code E11.65  . insulin NPH Human (HUMULIN N,NOVOLIN N) 100 UNIT/ML injection Inject 38 units into the skin every morning and inject 24 units into the skin at bedtime.  . insulin regular (NOVOLIN R,HUMULIN R) 100 units/mL injection Inject 16 Units into  the skin 2 (two) times daily before a meal.   . Insulin Syringe-Needle U-100 (INSULIN SYRINGE .5CC/31GX5/16") 31G X 5/16" 0.5 ML MISC USE TO INJECT INSULIN 5 TIMES PER DAY  . irbesartan (AVAPRO) 300 MG tablet Take 0.5 tablets (150 mg total) by mouth daily.  . isosorbide mononitrate (IMDUR) 30 MG 24 hr tablet TAKE 1 TABLET BY MOUTH EVERY DAY  . metFORMIN (GLUCOPHAGE) 500 MG tablet Take 1 tablet (500 mg total) by mouth daily with supper.  . mometasone (ELOCON) 0.1 % cream Apply 1 application topically daily. Use as directed  . Multiple Vitamin (MULTIVITAMIN) capsule Take 1 capsule by mouth daily.  . nitroGLYCERIN (NITROSTAT) 0.3 MG SL tablet Place 1 tablet (0.3 mg total) under the tongue every 5 (five) minutes as needed for chest pain (ER if no better after 3 tablets).  Marland Kitchen omeprazole (PRILOSEC) 20 MG capsule TAKE 1 CAPSULE(20 MG) BY MOUTH DAILY  . VICTOZA 18 MG/3ML SOPN ADMINISTER 1.2 MG UNDER THE SKIN DAILY     Allergies  Allergen Reactions  . Tramadol     Insomnia   . Codeine     REACTION: makes her nervous, orTylenol #3  . Ibuprofen     REACTION: nervous  . Meperidine Hcl     REACTION: nasuea and vomitting  . Naproxen Sodium     REACTION: nervous    Social History   Socioeconomic History  . Marital status: Widowed    Spouse name: Not on file  . Number of children: 2  . Years of education: Not on file  . Highest education level: Not on file  Occupational History  . Occupation: retired-- [employer]     Employer: RETIRED  Tobacco Use  . Smoking status: Never Smoker  . Smokeless tobacco: Never Used  Vaping Use  . Vaping Use: Never used  Substance and Sexual Activity  . Alcohol use: No    Alcohol/week: 0.0 standard drinks  . Drug use: No  . Sexual activity: Not Currently  Other Topics Concern  . Not on file  Social History Narrative   Lives by herself, lost husband ~ 2004 , still drives    1 child in [place]   I child in [place]   Social Determinants of Health   Financial  Resource Strain: Medium Risk  . Difficulty of Paying Living Expenses: Somewhat hard  Food Insecurity: Not on file  Transportation Needs: Not on file  Physical Activity: Not on file  Stress: Not on file  Social Connections: Not on file  Intimate Partner Violence: Not on file     Review of Systems: General: negative for chills, fever, night sweats or weight changes.  Cardiovascular: negative for chest pain, dyspnea on exertion, edema, orthopnea, palpitations, paroxysmal nocturnal dyspnea or shortness of breath Dermatological: negative for rash Respiratory: negative for cough or wheezing Urologic: negative for hematuria Abdominal: negative for nausea, vomiting, diarrhea, bright red blood per rectum, melena, or hematemesis Neurologic: negative for visual changes, syncope, or dizziness All other systems reviewed and are otherwise  negative except as noted above.    Blood pressure 134/62, pulse 68, height 5' 6"  (1.676 m), weight 182 lb (82.6 kg), SpO2 99 %.  General appearance: alert and no distress Neck: no adenopathy, no carotid bruit, no JVD, supple, symmetrical, trachea midline and thyroid not enlarged, symmetric, no tenderness/mass/nodules Lungs: clear to auscultation bilaterally Heart: regular rate and rhythm, S1, S2 normal, no murmur, click, rub or gallop Extremities: extremities normal, atraumatic, no cyanosis or edema Pulses: 2+ and symmetric Skin: Skin color, texture, turgor normal. No rashes or lesions Neurologic: Alert and oriented X 3, normal strength and tone. Normal symmetric reflexes. Normal coordination and gait  EKG sinus rhythm at 68 with incomplete right bundle branch block.  I personally reviewed this EKG.  ASSESSMENT AND PLAN:   Hyperlipidemia associated with type 2 diabetes mellitus ([place]) History of hyperlipidemia on statin therapy with lipid profile performed 11/25/2018 revealing total cholesterol of 116, LDL 49 and HDL of 36.  Hypertension associated with  diabetes ([place]) History of essential hypertension blood pressure measured today 134/62.  She is on amlodipine, atenolol and Avapro.  CAD (coronary artery disease) History of CAD status post acute myocardial infarction back in 1997 at which time I performed cardiac catheterization.  Apparently I placed the Brilinta but at that time and sent her to emergency open heart surgery with Dr. Darcey Nora who placed 5 bypass grafts.  She had a cardiac catheterization performed by Dr. Rollene Fare December 2008 revealing patent grafts and again by myself 09/16/2015 which again showed patent patent grafts with unbypassed nondominant circumflex.  Because of some atypical chest pain I did do a 2D echo and a Myoview stress test performed 05/12/2019 all of which were entirely normal.  She gets occasional atypical chest pain.      Lorretta Harp MD FACP,FACC,FAHA, [facility] 03/17/2020 11:37 AM

## 2020-03-17 NOTE — Assessment & Plan Note (Signed)
History of hyperlipidemia on statin therapy with lipid profile performed 11/25/2018 revealing total cholesterol of 116, LDL 49 and HDL of 36.

## 2020-03-17 NOTE — Assessment & Plan Note (Signed)
History of CAD status post acute myocardial infarction back in 1997 at which time I performed cardiac catheterization.  Apparently I placed the Brilinta but at that time and sent her to emergency open heart surgery with Dr. Darcey Nora who placed 5 bypass grafts.  She had a cardiac catheterization performed by Dr. Rollene Fare December 2008 revealing patent grafts and again by myself 09/16/2015 which again showed patent patent grafts with unbypassed nondominant circumflex.  Because of some atypical chest pain I did do a 2D echo and a Myoview stress test performed 05/12/2019 all of which were entirely normal.  She gets occasional atypical chest pain.

## 2020-03-17 NOTE — Assessment & Plan Note (Signed)
History of essential hypertension blood pressure measured today 134/62.  She is on amlodipine, atenolol and Avapro.

## 2020-03-22 ENCOUNTER — Ambulatory Visit: Payer: Medicare Other | Admitting: Endocrinology

## 2020-03-24 ENCOUNTER — Ambulatory Visit: Payer: Medicare Other | Admitting: Endocrinology

## 2020-04-28 ENCOUNTER — Other Ambulatory Visit: Payer: Self-pay | Admitting: Endocrinology

## 2020-05-03 ENCOUNTER — Telehealth: Payer: Self-pay | Admitting: Pharmacist

## 2020-05-03 NOTE — Telephone Encounter (Signed)
Patient is enrolled in CCM program. Her last outreach was January 2022 regarding patient assistance for Victoza.  In reviewing her chart it looks like we were waiting on completed tax returns / financial information but has not been provided yet.  Tried to call patient to f/u patient assistance with Victoza. No answer but LM on VM with my contact number [phone] or [phone].

## 2020-05-05 ENCOUNTER — Other Ambulatory Visit: Payer: Self-pay

## 2020-05-05 ENCOUNTER — Other Ambulatory Visit (INDEPENDENT_AMBULATORY_CARE_PROVIDER_SITE_OTHER): Payer: Medicare Other

## 2020-05-05 DIAGNOSIS — E1169 Type 2 diabetes mellitus with other specified complication: Secondary | ICD-10-CM | POA: Diagnosis not present

## 2020-05-05 DIAGNOSIS — E1165 Type 2 diabetes mellitus with hyperglycemia: Secondary | ICD-10-CM | POA: Diagnosis not present

## 2020-05-05 DIAGNOSIS — Z794 Long term (current) use of insulin: Secondary | ICD-10-CM

## 2020-05-05 DIAGNOSIS — E785 Hyperlipidemia, unspecified: Secondary | ICD-10-CM

## 2020-05-05 LAB — COMPREHENSIVE METABOLIC PANEL
ALT: 14 U/L (ref 0–35)
AST: 19 U/L (ref 0–37)
Albumin: 4 g/dL (ref 3.5–5.2)
Alkaline Phosphatase: 85 U/L (ref 39–117)
BUN: 22 mg/dL (ref 6–23)
CO2: 26 mEq/L (ref 19–32)
Calcium: 9.4 mg/dL (ref 8.4–10.5)
Chloride: 110 mEq/L (ref 96–112)
Creatinine, Ser: 1.55 mg/dL — ABNORMAL HIGH (ref 0.40–1.20)
GFR: 31.41 mL/min — ABNORMAL LOW (ref >60.00)
Glucose, Bld: 98 mg/dL (ref 70–99)
Potassium: 4.4 mEq/L (ref 3.5–5.1)
Sodium: 143 mEq/L (ref 135–145)
Total Bilirubin: 0.5 mg/dL (ref 0.2–1.2)
Total Protein: 6.6 g/dL (ref 6.0–8.3)

## 2020-05-05 LAB — HEMOGLOBIN A1C: Hgb A1c MFr Bld: 7 % — ABNORMAL HIGH (ref 4.6–6.5)

## 2020-05-05 LAB — LIPID PANEL
Cholesterol: 101 mg/dL (ref 0–200)
HDL: 28.9 mg/dL — ABNORMAL LOW (ref >39.00)
LDL Cholesterol: 41 mg/dL (ref 0–99)
NonHDL: 72.24
Total CHOL/HDL Ratio: 3
Triglycerides: 156 mg/dL — ABNORMAL HIGH (ref 0.0–149.0)
VLDL: 31.2 mg/dL (ref 0.0–40.0)

## 2020-05-07 ENCOUNTER — Other Ambulatory Visit: Payer: Self-pay

## 2020-05-07 ENCOUNTER — Inpatient Hospital Stay (HOSPITAL_BASED_OUTPATIENT_CLINIC_OR_DEPARTMENT_OTHER): Payer: Medicare Other | Admitting: Hematology & Oncology

## 2020-05-07 ENCOUNTER — Inpatient Hospital Stay: Payer: Medicare Other | Attending: Hematology & Oncology

## 2020-05-07 ENCOUNTER — Encounter: Payer: Self-pay | Admitting: Hematology & Oncology

## 2020-05-07 VITALS — BP 156/56 | HR 69 | Temp 98.2°F | Resp 18 | Wt 185.0 lb

## 2020-05-07 DIAGNOSIS — C9110 Chronic lymphocytic leukemia of B-cell type not having achieved remission: Secondary | ICD-10-CM

## 2020-05-07 DIAGNOSIS — I25118 Atherosclerotic heart disease of native coronary artery with other forms of angina pectoris: Secondary | ICD-10-CM

## 2020-05-07 DIAGNOSIS — K909 Intestinal malabsorption, unspecified: Secondary | ICD-10-CM | POA: Diagnosis not present

## 2020-05-07 DIAGNOSIS — E119 Type 2 diabetes mellitus without complications: Secondary | ICD-10-CM | POA: Insufficient documentation

## 2020-05-07 DIAGNOSIS — Z85048 Personal history of other malignant neoplasm of rectum, rectosigmoid junction, and anus: Secondary | ICD-10-CM | POA: Diagnosis not present

## 2020-05-07 DIAGNOSIS — D508 Other iron deficiency anemias: Secondary | ICD-10-CM

## 2020-05-07 DIAGNOSIS — D509 Iron deficiency anemia, unspecified: Secondary | ICD-10-CM | POA: Insufficient documentation

## 2020-05-07 LAB — CMP (CANCER CENTER ONLY)
ALT: 13 U/L (ref 0–44)
AST: 18 U/L (ref 15–41)
Albumin: 4.1 g/dL (ref 3.5–5.0)
Alkaline Phosphatase: 86 U/L (ref 38–126)
Anion gap: 7 (ref 5–15)
BUN: 26 mg/dL — ABNORMAL HIGH (ref 8–23)
CO2: 26 mmol/L (ref 22–32)
Calcium: 9.8 mg/dL (ref 8.9–10.3)
Chloride: 109 mmol/L (ref 98–111)
Creatinine: 1.61 mg/dL — ABNORMAL HIGH (ref 0.44–1.00)
GFR, Estimated: 32 mL/min — ABNORMAL LOW (ref >60)
Glucose, Bld: 113 mg/dL — ABNORMAL HIGH (ref 70–99)
Potassium: 4.2 mmol/L (ref 3.5–5.1)
Sodium: 142 mmol/L (ref 135–145)
Total Bilirubin: 0.4 mg/dL (ref 0.3–1.2)
Total Protein: 6.5 g/dL (ref 6.5–8.1)

## 2020-05-07 LAB — CBC WITH DIFFERENTIAL (CANCER CENTER ONLY)
Abs Immature Granulocytes: 0.14 10*3/uL — ABNORMAL HIGH (ref 0.00–0.07)
Basophils Absolute: 0.1 10*3/uL (ref 0.0–0.1)
Basophils Relative: 0 %
Eosinophils Absolute: 0.3 10*3/uL (ref 0.0–0.5)
Eosinophils Relative: 0 %
HCT: 34 % — ABNORMAL LOW (ref 36.0–46.0)
Hemoglobin: 10.6 g/dL — ABNORMAL LOW (ref 12.0–15.0)
Immature Granulocytes: 0 %
Lymphocytes Relative: 85 %
Lymphs Abs: 69.6 10*3/uL — ABNORMAL HIGH (ref 0.7–4.0)
MCH: 30.2 pg (ref 26.0–34.0)
MCHC: 31.2 g/dL (ref 30.0–36.0)
MCV: 96.9 fL (ref 80.0–100.0)
Monocytes Absolute: 8.2 10*3/uL — ABNORMAL HIGH (ref 0.1–1.0)
Monocytes Relative: 10 %
Neutro Abs: 4.5 10*3/uL (ref 1.7–7.7)
Neutrophils Relative %: 5 %
Platelet Count: 173 10*3/uL (ref 150–400)
RBC: 3.51 MIL/uL — ABNORMAL LOW (ref 3.87–5.11)
RDW: 14.2 % (ref 11.5–15.5)
WBC Count: 82.8 10*3/uL (ref 4.0–10.5)
nRBC: 0 % (ref 0.0–0.2)

## 2020-05-07 LAB — SAVE SMEAR(SSMR), FOR PROVIDER SLIDE REVIEW

## 2020-05-07 LAB — LACTATE DEHYDROGENASE: LDH: 176 U/L (ref 98–192)

## 2020-05-07 NOTE — Progress Notes (Signed)
Hematology and Oncology Follow Up Visit  [place] [id number] [birth year] 80 y.o. 05/07/2020   Principle Diagnosis:  CLL -stage A Remote history of colon cancer Iron deficiency anemia  Current Therapy:   IV iron as indicated-patient received a dose in 01/2019   Interim History: [name] is here today for follow-up.  So far, things are going okay for her.  She does feel quite tired.  She is feels that her blood is down.  Her hemoglobin is 10.6.  She glad to see what her iron studies look like.  She still has some discomfort with the left hip.  She broke the left hip.  She had surgery for the left hip.  This was back in August 2020.  She has not noted any palpable lymph nodes.  She is not noted any rashes.  She does have diabetes.  There is no change in bowel or bladder habits.  I noted that her white cell count is slowly trending upward.  This could certainly be a problem and could reflect her CLL.  She has had no problems with COVID.  There is no cough.  Overall, her performance status is ECOG 1.     Medications:  Allergies as of 05/07/2020      Reactions   Tramadol    Insomnia    Codeine    REACTION: makes her nervous, orTylenol #3   Ibuprofen    REACTION: nervous   Meperidine Hcl    REACTION: nasuea and vomitting   Naproxen Sodium    REACTION: nervous      Medication List       Accurate as of May 07, 2020  2:12 PM. If you have any questions, ask your nurse or doctor.        amLODipine 5 MG tablet Commonly known as: NORVASC TAKE 1 TABLET(5 MG) BY MOUTH DAILY   aspirin 81 MG chewable tablet Chew 1 tablet (81 mg total) by mouth 2 (two) times daily with a meal.   atenolol 100 MG tablet Commonly known as: TENORMIN TAKE 1 TABLET(100 MG) BY MOUTH DAILY   atorvastatin 20 MG tablet Commonly known as: LIPITOR TAKE 1 TABLET(20 MG) BY MOUTH DAILY   azelastine 0.1 % nasal spray Commonly known as: ASTELIN Place 2 sprays into both nostrils at bedtime  as needed for rhinitis or allergies. Use in each nostril as directed   cholecalciferol 1000 units tablet Commonly known as: VITAMIN D Take 1,000 Units by mouth daily.   clopidogrel 75 MG tablet Commonly known as: PLAVIX Take 1 tablet (75 mg total) by mouth daily.   clorazepate 7.5 MG tablet Commonly known as: TRANXENE Take 1 tablet (7.5 mg total) by mouth as needed.   fluticasone 50 MCG/ACT nasal spray Commonly known as: FLONASE SHAKE LIQUID AND USE 2 SPRAYS IN EACH NOSTRIL DAILY What changed: See the new instructions.   insulin NPH Human 100 UNIT/ML injection Commonly known as: NOVOLIN N Inject 38 units into the skin every morning and inject 24 units into the skin at bedtime.   insulin regular 100 units/mL injection Commonly known as: NOVOLIN R Inject 16 Units into the skin 2 (two) times daily before a meal.   INSULIN SYRINGE .5CC/31GX5/16" 31G X 5/16" 0.5 ML Misc USE TO INJECT INSULIN 5 TIMES PER DAY   irbesartan 300 MG tablet Commonly known as: Avapro Take 0.5 tablets (150 mg total) by mouth daily.   isosorbide mononitrate 30 MG 24 hr tablet Commonly known as: IMDUR TAKE 1  TABLET BY MOUTH EVERY DAY   metFORMIN 500 MG tablet Commonly known as: GLUCOPHAGE Take 1 tablet (500 mg total) by mouth daily with supper.   mometasone 0.1 % cream Commonly known as: ELOCON Apply 1 application topically daily. Use as directed   multivitamin capsule Take 1 capsule by mouth daily.   nitroGLYCERIN 0.3 MG SL tablet Commonly known as: Nitrostat Place 1 tablet (0.3 mg total) under the tongue every 5 (five) minutes as needed for chest pain (ER if no better after 3 tablets).   omeprazole 20 MG capsule Commonly known as: PRILOSEC TAKE 1 CAPSULE(20 MG) BY MOUTH DAILY   ONE TOUCH ULTRA SYSTEM KIT w/Device Kit 1 kit by Does not apply route once.   OneTouch Ultra test strip Generic drug: glucose blood USE TO TEST BLOOD SUGAR TWICE DAILY Dx code E11.65   Victoza 18 MG/3ML  Sopn Generic drug: liraglutide ADMINISTER 1.2 MG UNDER THE SKIN DAILY       Allergies:  Allergies  Allergen Reactions  . Tramadol     Insomnia   . Codeine     REACTION: makes her nervous, orTylenol #3  . Ibuprofen     REACTION: nervous  . Meperidine Hcl     REACTION: nasuea and vomitting  . Naproxen Sodium     REACTION: nervous    Past Medical History, Surgical history, Social history, and Family History were reviewed and updated.  Review of Systems: Review of Systems  Constitutional: Negative.   HENT: Negative.   Eyes: Negative.   Respiratory: Negative.   Cardiovascular: Negative.   Gastrointestinal: Negative.   Genitourinary: Negative.   Musculoskeletal: Negative.   Skin: Negative.   Neurological: Negative.   Endo/Heme/Allergies: Negative.   Psychiatric/Behavioral: Negative.      Physical Exam:  weight is 185 lb (83.9 kg). Her oral temperature is 98.2 F (36.8 C). Her blood pressure is 156/56 (abnormal) and her pulse is 69. Her respiration is 18 and oxygen saturation is 98%.   Wt Readings from Last 3 Encounters:  05/07/20 185 lb (83.9 kg)  03/17/20 182 lb (82.6 kg)  01/09/20 175 lb 12.8 oz (79.7 kg)    Physical Exam Vitals reviewed.  HENT:     Head: Normocephalic and atraumatic.  Eyes:     Pupils: Pupils are equal, round, and reactive to light.  Cardiovascular:     Rate and Rhythm: Normal rate and regular rhythm.     Heart sounds: Normal heart sounds.  Pulmonary:     Effort: Pulmonary effort is normal.     Breath sounds: Normal breath sounds.  Abdominal:     General: Bowel sounds are normal.     Palpations: Abdomen is soft.  Musculoskeletal:        General: No tenderness or deformity. Normal range of motion.     Cervical back: Normal range of motion.  Lymphadenopathy:     Cervical: No cervical adenopathy.  Skin:    General: Skin is warm and dry.     Findings: No erythema or rash.  Neurological:     Mental Status: She is alert and oriented  to person, place, and time.  Psychiatric:        Behavior: Behavior normal.        Thought Content: Thought content normal.        Judgment: Judgment normal.      Lab Results  Component Value Date   WBC 82.8 (HH) 05/07/2020   HGB 10.6 (L) 05/07/2020   HCT 34.0 (L)  05/07/2020   MCV 96.9 05/07/2020   PLT 173 05/07/2020   Lab Results  Component Value Date   FERRITIN 179 01/09/2020   IRON 108 01/09/2020   TIBC 296 01/09/2020   UIBC 188 01/09/2020   IRONPCTSAT 36 01/09/2020   Lab Results  Component Value Date   RBC 3.51 (L) 05/07/2020   Lab Results  Component Value Date   KAPLAMBRATIO 1.22 11/03/2016   Lab Results  Component Value Date   IGGSERUM 967 01/09/2020   IGA 195 01/09/2020   IGMSERUM 28 01/09/2020   Lab Results  Component Value Date   TOTALPROTELP 7.2 02/25/2014   ALBUMINELP 54.3 (L) 02/25/2014   A1GS 4.4 02/25/2014   A2GS 13.7 (H) 02/25/2014   BETS 7.0 02/25/2014   BETA2SER 6.2 02/25/2014   GAMS 14.4 02/25/2014   MSPIKE Not Observed 11/03/2016   SPEI * 02/25/2014     Chemistry      Component Value Date/Time   NA 142 05/07/2020 1331   NA 147 (H) 11/03/2016 1018   NA 143 08/30/2016 0954   K 4.2 05/07/2020 1331   K 3.9 11/03/2016 1018   K 4.5 08/30/2016 0954   CL 109 05/07/2020 1331   CL 110 (H) 11/03/2016 1018   CO2 26 05/07/2020 1331   CO2 27 11/03/2016 1018   CO2 24 08/30/2016 0954   BUN 26 (H) 05/07/2020 1331   BUN 25 (H) 11/03/2016 1018   BUN 20.5 08/30/2016 0954   CREATININE 1.61 (H) 05/07/2020 1331   CREATININE 1.6 (H) 11/03/2016 1018   CREATININE 1.4 (H) 08/30/2016 0954      Component Value Date/Time   CALCIUM 9.8 05/07/2020 1331   CALCIUM 10.1 11/03/2016 1018   CALCIUM 9.8 08/30/2016 0954   ALKPHOS 86 05/07/2020 1331   ALKPHOS 112 (H) 11/03/2016 1018   ALKPHOS 113 08/30/2016 0954   AST 18 05/07/2020 1331   AST 23 08/30/2016 0954   ALT 13 05/07/2020 1331   ALT 29 11/03/2016 1018   ALT 20 08/30/2016 0954   BILITOT 0.4  05/07/2020 1331   BILITOT 0.38 08/30/2016 0954      Impression and Plan: [name] is a very pleasant 80 yo caucasian female with CLL stage A.  Unfortunately, I have noted that her white cell count is trending upward.  I do suspect that the anemia is probably more so from iron deficiency.  It could certainly be from the CLL.  When I have to get her back probably in a couple months.  We will get him have to think about treatment if her white cell count continues to trend upward.  I probably would consider a combination of a monoclonal antibody with an oral agent.  I think this would be reasonable.  Again to have to get the molecular studies on her CLL now.  I will see what we can do with blood work.  [name], MD 5/6/20222:12 PM

## 2020-05-08 LAB — IGG, IGA, IGM
IgA: 158 mg/dL (ref 64–422)
IgG (Immunoglobin G), Serum: 775 mg/dL (ref 586–1602)
IgM (Immunoglobulin M), Srm: 18 mg/dL — ABNORMAL LOW (ref 26–217)

## 2020-05-10 LAB — FERRITIN: Ferritin: 66 ng/mL (ref 11–307)

## 2020-05-10 LAB — IRON AND TIBC
Iron: 60 ug/dL (ref 41–142)
Saturation Ratios: 18 % — ABNORMAL LOW (ref 21–57)
TIBC: 330 ug/dL (ref 236–444)
UIBC: 270 ug/dL (ref 120–384)

## 2020-05-11 ENCOUNTER — Other Ambulatory Visit: Payer: Self-pay

## 2020-05-11 ENCOUNTER — Ambulatory Visit (INDEPENDENT_AMBULATORY_CARE_PROVIDER_SITE_OTHER): Payer: Medicare Other | Admitting: Endocrinology

## 2020-05-11 ENCOUNTER — Encounter: Payer: Self-pay | Admitting: Endocrinology

## 2020-05-11 VITALS — BP 130/86 | HR 68 | Ht 66.0 in | Wt 184.2 lb

## 2020-05-11 DIAGNOSIS — Z794 Long term (current) use of insulin: Secondary | ICD-10-CM

## 2020-05-11 DIAGNOSIS — I25118 Atherosclerotic heart disease of native coronary artery with other forms of angina pectoris: Secondary | ICD-10-CM

## 2020-05-11 DIAGNOSIS — E1165 Type 2 diabetes mellitus with hyperglycemia: Secondary | ICD-10-CM | POA: Diagnosis not present

## 2020-05-11 DIAGNOSIS — R600 Localized edema: Secondary | ICD-10-CM

## 2020-05-11 DIAGNOSIS — E782 Mixed hyperlipidemia: Secondary | ICD-10-CM | POA: Diagnosis not present

## 2020-05-11 DIAGNOSIS — I10 Essential (primary) hypertension: Secondary | ICD-10-CM | POA: Diagnosis not present

## 2020-05-11 NOTE — Patient Instructions (Addendum)
Take 12 R in am and also in pm if more active Same N insulin  Take 15-20 min before meals  Check blood sugars on waking up days a week  Also check blood sugars about 2 hours after meals and do this after different meals by rotation  Recommended blood sugar levels on waking up are 90-130 and about 2 hours after meal is 130-160  Please bring your blood sugar monitor to each visit, thank you

## 2020-05-11 NOTE — Progress Notes (Signed)
Patient ID: [name], female   DOB: 1940/06/28, 80 y.o.   MRN: [id number]            Reason for Appointment: Followup for Type 2 Diabetes   History of Present Illness:          Diagnosis: Type 2 diabetes mellitus, date of diagnosis: 1998       Past history:   She was treated with metformin at diagnosis when this had been continued until 2015 Subsequently Amaryl was also added several years ago and this has been continued Her blood sugars were under fair control between 2010 and early 2013 with A1c ranging from 7.4-9.4, mostly under 8% However since 08/2011 her A1c has been mostly over 8%  Insulin was added in 2014 with sma limited bottom ll doses of Lantus and this has been progressively increased She was started on mealtime insulin on her initial consultation in 9/15 because of high postprandial readings, sometimes over 300 With adding Victoza in 02/2014 her blood sugars were somewhat better with A1c coming down below 8%  Recent history:   INSULIN regimen: Regular 14 units before meals twice daily.  NPH 38 units in the morning and 24 hs  Non-insulin hypoglycemic drugs the patient is taking are: Victoza 1.2 mg daily.    Current blood sugar patterns, daily management and problems identified:   Her A1c is 7 compared to 6.9, previously 8% in July 21    She was told to reduce her morning regular insulin to 12 units but she is still taking either 14 or 16, she does not remember the exact dose  She does not check sugars after breakfast but her lab glucose is usually low normal when checked midday after breakfast  Again usually not eating more than 2 meals a day and may only have a snack later in the afternoon  Her blood sugars at home are excellent with relatively good readings in the mornings but somewhat variable at night  No hypoglycemia reported  She is still not able to lose weight  However her home blood sugars are generally looking better than expected from her  A1c  Trying to take her insulin doses 30 minutes before eating at least in the morning  She does more gardening this time of the year and she feels like some of the low normal readings at night are related to increased activity  VICTOZA: She has continued to take 1.2 mg daily without any side effects      Dinner is usually at 6-7 pm Bfst 10 am  Glucose monitoring:  done 1-2 times a day         Glucometer: One Touch ultra 2.       Blood Glucose readings by time of day from download for the last 2 weeks:   PRE-MEAL Fasting Lunch Dinner Bedtime Overall  Glucose range:  93-152    75-226  Mean/median:  125    139   POST-MEAL PC Breakfast PC Lunch PC Dinner  Glucose range:    75-226  Mean/median:    150   Previously:  PRE-MEAL Fasting Lunch Dinner Bedtime Overall  Glucose range: 104-184      Mean/median:  135    141   POST-MEAL PC Breakfast PC Lunch PC Dinner  Glucose range:    77-259  Mean/median:    148     Self-care: The diet that the patient has been following is: none, usually eating low fat meals Meals: 2-3 meals per  day          Dietician visit, most recent: never.  She saw the CDE in 02/2014               Weight history:  Wt Readings from Last 3 Encounters:  05/11/20 184 lb 3.2 oz (83.6 kg)  05/07/20 185 lb (83.9 kg)  03/17/20 182 lb (82.6 kg)    Glycemic control:   Lab Results  Component Value Date   HGBA1C 7.0 (H) 05/05/2020   HGBA1C 6.9 (H) 11/18/2019   HGBA1C 8.0 (H) 07/15/2019   Lab Results  Component Value Date   MICROALBUR 7.5 (H) 11/18/2019   LDLCALC 41 05/05/2020   CREATININE 1.61 (H) 05/07/2020     OTHER active problems  discussed in review of systems   Allergies as of 05/11/2020      Reactions   Tramadol    Insomnia    Codeine    REACTION: makes her nervous, orTylenol #3   Ibuprofen    REACTION: nervous   Meperidine Hcl    REACTION: nasuea and vomitting   Naproxen Sodium    REACTION: nervous      Medication List        Accurate as of May 11, 2020 11:59 PM. If you have any questions, ask your nurse or doctor.        amLODipine 5 MG tablet Commonly known as: NORVASC TAKE 1 TABLET(5 MG) BY MOUTH DAILY   aspirin 81 MG chewable tablet Chew 1 tablet (81 mg total) by mouth 2 (two) times daily with a meal.   atenolol 100 MG tablet Commonly known as: TENORMIN TAKE 1 TABLET(100 MG) BY MOUTH DAILY   atorvastatin 20 MG tablet Commonly known as: LIPITOR TAKE 1 TABLET(20 MG) BY MOUTH DAILY   azelastine 0.1 % nasal spray Commonly known as: ASTELIN Place 2 sprays into both nostrils at bedtime as needed for rhinitis or allergies. Use in each nostril as directed   cholecalciferol 1000 units tablet Commonly known as: VITAMIN D Take 1,000 Units by mouth daily.   clopidogrel 75 MG tablet Commonly known as: PLAVIX Take 1 tablet (75 mg total) by mouth daily.   clorazepate 7.5 MG tablet Commonly known as: TRANXENE Take 1 tablet (7.5 mg total) by mouth as needed.   fluticasone 50 MCG/ACT nasal spray Commonly known as: FLONASE SHAKE LIQUID AND USE 2 SPRAYS IN EACH NOSTRIL DAILY What changed: See the new instructions.   insulin NPH Human 100 UNIT/ML injection Commonly known as: NOVOLIN N Inject 38 units into the skin every morning and inject 24 units into the skin at bedtime.   insulin regular 100 units/mL injection Commonly known as: NOVOLIN R Inject 16 Units into the skin 2 (two) times daily before a meal.   INSULIN SYRINGE .5CC/31GX5/16" 31G X 5/16" 0.5 ML Misc USE TO INJECT INSULIN 5 TIMES PER DAY   irbesartan 300 MG tablet Commonly known as: Avapro Take 0.5 tablets (150 mg total) by mouth daily.   isosorbide mononitrate 30 MG 24 hr tablet Commonly known as: IMDUR TAKE 1 TABLET BY MOUTH EVERY DAY   metFORMIN 500 MG tablet Commonly known as: GLUCOPHAGE Take 1 tablet (500 mg total) by mouth daily with supper.   mometasone 0.1 % cream Commonly known as: ELOCON Apply 1 application topically  daily. Use as directed   multivitamin capsule Take 1 capsule by mouth daily.   nitroGLYCERIN 0.3 MG SL tablet Commonly known as: Nitrostat Place 1 tablet (0.3 mg total) under the  tongue every 5 (five) minutes as needed for chest pain (ER if no better after 3 tablets).   omeprazole 20 MG capsule Commonly known as: PRILOSEC TAKE 1 CAPSULE(20 MG) BY MOUTH DAILY   ONE TOUCH ULTRA SYSTEM KIT w/Device Kit 1 kit by Does not apply route once.   OneTouch Ultra test strip Generic drug: glucose blood USE TO TEST BLOOD SUGAR TWICE DAILY Dx code E11.65   Victoza 18 MG/3ML Sopn Generic drug: liraglutide ADMINISTER 1.2 MG UNDER THE SKIN DAILY       Allergies:  Allergies  Allergen Reactions  . Tramadol     Insomnia   . Codeine     REACTION: makes her nervous, orTylenol #3  . Ibuprofen     REACTION: nervous  . Meperidine Hcl     REACTION: nasuea and vomitting  . Naproxen Sodium     REACTION: nervous    Past Medical History:  Diagnosis Date  . Acute blood loss anemia 09/17/2015  . Allergy   . Anemia   . Anginal pain ([place]) 2017  . Anxiety   . Arthritis    "hands and knees mostly" (09/16/2015)  . B12 deficiency anemia   . CAD (coronary artery disease)    Dr Gwenlyn Found  . Cataract    bil cataracts removed  . 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 Clotting disorder ([place])   . Depression   . DJD (degenerative joint disease)   . 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 Gastritis   . GERD (gastroesophageal reflux disease)   . Headache(784.0)   . History of blood transfusion    "I"ve had 20 some; thru birth of children, loss of blood, last 2 were in ~ 1999 before my cancer surgery" (09/16/2015)  . History of hiatal hernia   . History of shingles   . Hx of colonic polyps   . Hyperlipidemia   . Hypertension   . 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 Myocardial infarction ([place])    1997  . Pneumonia    X 1  .  Postoperative hematoma involving circulatory system following cardiac catheterization 09/17/2015  . S/P cardiac cath 09/16/15 09/17/2015  . Type II diabetes mellitus ([place])   . 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 Venous insufficiency     Past Surgical History:  Procedure Laterality Date  . CARDIAC CATHETERIZATION  1997; 2008; 09/16/2015  . CARDIAC CATHETERIZATION N/A 09/16/2015   Procedure: Right/Left Heart Cath and Coronary/Graft Angiography;  Surgeon: Lorretta Harp, MD;  Location: [place] [facility];  Service: Cardiovascular;  Laterality: N/A;  . CESAREAN SECTION  1966  . COLONOSCOPY    . CORONARY ARTERY BYPASS GRAFT  1997   CABG X5  . EYE SURGERY     2 laser surgeries on left eye with implant  . EYE SURGERY Bilateral    cataracts  . FEMUR IM NAIL Left 08/28/2018   Procedure: INTRAMEDULLARY (IM) NAIL FEMORAL;  Surgeon: Rod Can, MD;  Location: [facility];  Service: Orthopedics;  Laterality: Left;  . FRACTURE SURGERY    . HERNIA REPAIR    . LAPAROSCOPIC ASSISTED VENTRAL HERNIA REPAIR  09/2008    with incarcerated colon;  Dr. Lucia Gaskins  . MOHS SURGERY  06/2016   BCC  . PATELLA FRACTURE SURGERY Left 11/1994   "crushed my knee"; put in a plate & 6 screws"  . POLYPECTOMY    .  REFRACTIVE SURGERY Left 07/30/2015  . 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 March Rummage  . SHOULDER ARTHROSCOPY W/ ROTATOR CUFF REPAIR Right 1997  . SMALL INTESTINE SURGERY    . TONSILLECTOMY  1960s  . TOTAL KNEE ARTHROPLASTY WITH HARDWARE REMOVAL Left 12/1994    (infex, hardware removed )  . TUBAL LIGATION  1966    Family History  Problem Relation Age of Onset  . Heart disease Mother 78  . Heart disease Father 71       MI  . Hypertension Child   . Heart attack Child   . Diabetes Child   . Heart disease Maternal Aunt        x 2, all deceased  . Heart disease Maternal Uncle        x 4, all deceased  . Emphysema Brother 61  . Colon cancer Neg Hx   . Breast cancer  Neg Hx   . Rectal cancer Neg Hx   . Stomach cancer Neg Hx     Social History:  reports that she has never smoked. She has never used smokeless tobacco. She reports that she does not drink alcohol and does not use drugs.    Review of Systems         Lipids: Has had excellent control of hypercholesterolemia with long-term use of Lipitor She has a history of coronary bypass surgery.  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 creatinine has been fluctuating  She does have a high microalbumin level in the past Normal in 11/21    Lab Results  Component Value Date   CREATININE 1.61 (H) 05/07/2020   CREATININE 1.55 (H) 05/05/2020   CREATININE 1.92 (H) 01/09/2020       No history of Numbness, tingling or burning in feet   Diabetic foot exam was in 5/19 showing mild neuropathy  History of CLL: Under the care of hematologist every 6 months, labs as follows  Lab Results  Component Value Date   WBC 82.8 (HH) 05/07/2020   Lab Results  Component Value Date   HGB 10.6 (L) 05/07/2020       Physical Examination:  BP 130/86   Pulse 68   Ht 5' 6"  (1.676 m)   Wt 184 lb 3.2 oz (83.6 kg)   SpO2 97%   BMI 29.73 kg/m   Trace ankle edema present  Diabetes type 2, insulin requiring    See history of present illness for detailed discussion of her current blood sugar patterns, management and problems identified  Her A1c is 7  She has been on  regular and NPH insulin twice a day along with metformin and Victoza  Although her A1c is 7 her blood sugars are generally averaging lower than expected As above she does  have lower readings in the evenings after being more active outside Also she forgets to adjust her insulin doses as directed on the last visit and was supposed to take only 12 units regular insulin in the morning Symptomatically no hypoglycemia Overall diet is good with highest blood sugar after dinner only occasionally over 180   RENAL insufficiency: Her creatinine is variable but recently stable Likely from nephrosclerosis  HYPERTENSION: Blood pressure is well controlled  She may potentially need a diuretic if she continues to have relatively high diastolic readings along with some tendency to edema  LIPIDS: LDL well controlled but still showing diabetic dyslipidemia with low HDL and mildly increased triglycerides  PLAN:   Discussed timing of blood sugar monitoring she will try to check more readings after breakfast about 2 hours later  Advised her to reduce her morning regular insulin to 12 units  Also in the evening if she is more active during the day she will reduce her suppertime dose also to 12 units  No change in NPH insulin  Continue to take 500 mg metformin at dinnertime  Consider freestyle libre on the next visit  Continue Victoza 1.2 mg daily  To call if she has any increasing edema, she will also follow-up with her PCP  Recommended that she get the second booster shot because of her multiple medical conditions, age and immunocompromise status  Patient Instructions  Take 12 R in am and also in pm if more active Same N insulin  Take 15-20 min before meals  Check blood sugars on waking up days a week  Also check blood sugars about 2 hours after meals and do this after different meals by rotation  Recommended blood sugar levels on waking up are 90-130 and about 2 hours after meal is  130-160  Please bring your blood sugar monitor to each visit, thank you      Elayne Snare 05/12/2020, 8:14 AM   Note: This office note was prepared with Dragon voice recognition system technology. Any transcriptional errors that result from this process are unintentional.

## 2020-05-20 DIAGNOSIS — Z85828 Personal history of other malignant neoplasm of skin: Secondary | ICD-10-CM | POA: Diagnosis not present

## 2020-05-20 DIAGNOSIS — D485 Neoplasm of uncertain behavior of skin: Secondary | ICD-10-CM | POA: Diagnosis not present

## 2020-05-20 DIAGNOSIS — L570 Actinic keratosis: Secondary | ICD-10-CM | POA: Diagnosis not present

## 2020-05-20 DIAGNOSIS — L905 Scar conditions and fibrosis of skin: Secondary | ICD-10-CM | POA: Diagnosis not present

## 2020-05-20 DIAGNOSIS — C44311 Basal cell carcinoma of skin of nose: Secondary | ICD-10-CM | POA: Diagnosis not present

## 2020-05-27 ENCOUNTER — Telehealth: Payer: Self-pay | Admitting: Endocrinology

## 2020-05-27 NOTE — Telephone Encounter (Signed)
Pt went and called in test strips for the pharmacy refill  and that pharmacy states that pt is 34 days to early to get them refilled.  Pt test twice a day. Pt would like a call back because she can not get her test strips.

## 2020-05-27 NOTE — Telephone Encounter (Signed)
Please advise 

## 2020-05-28 NOTE — Telephone Encounter (Signed)
New prescription for the strips will be for 3x a day

## 2020-05-31 ENCOUNTER — Other Ambulatory Visit: Payer: Self-pay | Admitting: Endocrinology

## 2020-05-31 MED ORDER — ONETOUCH ULTRA VI STRP: ORAL_STRIP | 5 refills | Status: DC

## 2020-05-31 NOTE — Telephone Encounter (Signed)
Sent new Rx as instructed

## 2020-06-01 NOTE — Telephone Encounter (Signed)
Spoken to patient and notified New Rx was sent.

## 2020-06-07 ENCOUNTER — Telehealth: Payer: Self-pay | Admitting: Endocrinology

## 2020-06-07 NOTE — Telephone Encounter (Signed)
Pt would like a call back regarding insulin and some questions about her test strips . States that her insurance will cover Humulin also  Pt would like a call back as soon as possible

## 2020-06-09 DIAGNOSIS — Z23 Encounter for immunization: Secondary | ICD-10-CM | POA: Diagnosis not present

## 2020-06-14 ENCOUNTER — Other Ambulatory Visit: Payer: Self-pay

## 2020-06-14 ENCOUNTER — Ambulatory Visit (INDEPENDENT_AMBULATORY_CARE_PROVIDER_SITE_OTHER): Payer: Medicare Other | Admitting: Internal Medicine

## 2020-06-14 ENCOUNTER — Encounter: Payer: Self-pay | Admitting: Internal Medicine

## 2020-06-14 VITALS — BP 138/66 | HR 69 | Temp 97.8°F | Resp 18 | Ht 66.0 in | Wt 177.1 lb

## 2020-06-14 DIAGNOSIS — E538 Deficiency of other specified B group vitamins: Secondary | ICD-10-CM

## 2020-06-14 DIAGNOSIS — C9110 Chronic lymphocytic leukemia of B-cell type not having achieved remission: Secondary | ICD-10-CM

## 2020-06-14 DIAGNOSIS — I25118 Atherosclerotic heart disease of native coronary artery with other forms of angina pectoris: Secondary | ICD-10-CM | POA: Diagnosis not present

## 2020-06-14 DIAGNOSIS — N1832 Chronic kidney disease, stage 3b: Secondary | ICD-10-CM

## 2020-06-14 DIAGNOSIS — M810 Age-related osteoporosis without current pathological fracture: Secondary | ICD-10-CM

## 2020-06-14 LAB — B12 AND FOLATE PANEL
Folate: >24.4 ng/mL (ref >5.9)
Vitamin B-12: 555 pg/mL (ref 211–911)

## 2020-06-14 MED ORDER — DENOSUMAB 60 MG/ML ~~LOC~~ SOSY
60.0000 mg | PREFILLED_SYRINGE | Freq: Once | SUBCUTANEOUS | Status: AC
  Administered 2020-06-14: 60 mg via SUBCUTANEOUS

## 2020-06-14 NOTE — Patient Instructions (Signed)
   GO TO THE LAB : Get the blood work     GO TO THE FRONT DESK, PLEASE SCHEDULE YOUR APPOINTMENTS Come back for a checkup in 6 months 

## 2020-06-14 NOTE — Progress Notes (Signed)
Subjective:    Patient ID: [name], female    DOB: 10-13-1940, 80 y.o.   MRN: [id number]  DOS:  06/14/2020 Type of visit - description: Routine office visit  Since the last visit with me, has seen endocrinology, cardiology and hematology.  Notes reviewed. Has no new complaints. Chronic weakness made worse after she had a COVID-19 booster recently.  She also developed nausea, vomiting. Overall GI symptoms better. 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/25/2014   Clotting disorder ([place])    Depression    DJD (degenerative joint disease)    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   Gastritis    GERD (gastroesophageal reflux disease)    Headache(784.0)    History of blood transfusion    "I"ve had 20 some; thru birth of children, loss of blood, last 2 were in ~ 1999 before my cancer surgery" (09/16/2015)   History of hiatal hernia    History of shingles    Hx of colonic polyps    Hyperlipidemia    Hypertension    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   Myocardial infarction ([place])    1997   Pneumonia    X 1   Postoperative hematoma involving circulatory system following cardiac catheterization 09/17/2015   S/P cardiac cath 09/16/15 09/17/2015   Type II diabetes mellitus ([place])    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[facility])    as a child   Venous insufficiency     Past Surgical History:  Procedure Laterality Date   [place]; 2008; 09/16/2015   CARDIAC CATHETERIZATION N/A 09/16/2015   Procedure: Right/Left Heart Cath and Coronary/Graft  Angiography;  Surgeon: Lorretta Harp, MD;  Location: [place] [facility] LAB;  Service: Cardiovascular;  Laterality: N/A;   CESAREAN SECTION  1966   COLONOSCOPY     CORONARY ARTERY BYPASS GRAFT  1997   CABG X5   EYE SURGERY     2 laser surgeries on left eye with implant   EYE SURGERY Bilateral    cataracts   FEMUR IM NAIL Left 08/28/2018   Procedure: INTRAMEDULLARY (IM) NAIL FEMORAL;  Surgeon: Rod Can, MD;  [facility];  Service: Orthopedics;  Laterality: Left;   FRACTURE SURGERY     HERNIA REPAIR     LAPAROSCOPIC ASSISTED VENTRAL HERNIA REPAIR  09/2008    with incarcerated colon;  Dr. Lucia Gaskins   MOHS SURGERY  06/2016   [place] Left 11/1994   "crushed my knee"; put in a plate & 6 screws"   POLYPECTOMY     REFRACTIVE SURGERY Left 07/30/2015   resection of small bowel carcinoma  06/1998   Dr. March Rummage   SHOULDER ARTHROSCOPY W/ ROTATOR CUFF REPAIR Right 1997   SMALL INTESTINE SURGERY     TONSILLECTOMY  1960s   TOTAL KNEE ARTHROPLASTY WITH HARDWARE REMOVAL Left 12/1994    (infex, hardware removed )   TUBAL LIGATION  1966    Allergies as  of 06/14/2020       Reactions   Tramadol    Insomnia    Codeine    REACTION: makes her nervous, orTylenol #3   Ibuprofen    REACTION: nervous   Meperidine Hcl    REACTION: nasuea and vomitting   Naproxen Sodium    REACTION: nervous        Medication List        Accurate as of June 14, 2020 11:59 PM. If you have any questions, ask your nurse or doctor.          amLODipine 5 MG tablet Commonly known as: NORVASC TAKE 1 TABLET(5 MG) BY MOUTH DAILY   aspirin 81 MG chewable tablet Chew 1 tablet (81 mg total) by mouth 2 (two) times daily with a meal. What changed: when to take this   atenolol 100 MG tablet Commonly known as: TENORMIN TAKE 1 TABLET(100 MG) BY MOUTH DAILY   atorvastatin 20 MG tablet Commonly known as: LIPITOR TAKE 1 TABLET(20 MG) BY MOUTH DAILY   azelastine 0.1 % nasal  spray Commonly known as: ASTELIN Place 2 sprays into both nostrils at bedtime as needed for rhinitis or allergies. Use in each nostril as directed   cholecalciferol 1000 units tablet Commonly known as: VITAMIN D Take 1,000 Units by mouth daily.   clopidogrel 75 MG tablet Commonly known as: PLAVIX Take 1 tablet (75 mg total) by mouth daily.   clorazepate 7.5 MG tablet Commonly known as: TRANXENE Take 1 tablet (7.5 mg total) by mouth as needed.   fluticasone 50 MCG/ACT nasal spray Commonly known as: FLONASE SHAKE LIQUID AND USE 2 SPRAYS IN EACH NOSTRIL DAILY What changed: See the new instructions.   insulin NPH Human 100 UNIT/ML injection Commonly known as: NOVOLIN N Inject 38 units into the skin every morning and inject 24 units into the skin at bedtime.   insulin regular 100 units/mL injection Commonly known as: NOVOLIN R Inject 16 Units into the skin 2 (two) times daily before a meal.   INSULIN SYRINGE .5CC/31GX5/16" 31G X 5/16" 0.5 ML Misc USE TO INJECT INSULIN 5 TIMES PER DAY   irbesartan 300 MG tablet Commonly known as: Avapro Take 0.5 tablets (150 mg total) by mouth daily.   isosorbide mononitrate 30 MG 24 hr tablet Commonly known as: IMDUR TAKE 1 TABLET BY MOUTH EVERY DAY   metFORMIN 500 MG tablet Commonly known as: GLUCOPHAGE Take 1 tablet (500 mg total) by mouth daily with supper.   mometasone 0.1 % cream Commonly known as: ELOCON Apply 1 application topically daily. Use as directed   multivitamin capsule Take 1 capsule by mouth daily.   nitroGLYCERIN 0.3 MG SL tablet Commonly known as: Nitrostat Place 1 tablet (0.3 mg total) under the tongue every 5 (five) minutes as needed for chest pain (ER if no better after 3 tablets).   omeprazole 20 MG capsule Commonly known as: PRILOSEC TAKE 1 CAPSULE(20 MG) BY MOUTH DAILY   ONE TOUCH ULTRA SYSTEM KIT w/Device Kit 1 kit by Does not apply route once.   OneTouch Ultra test strip Generic drug: glucose  blood USE TO TEST BLOOD SUGAR 3 TIMES DAILY Dx code E11.65   Victoza 18 MG/3ML Sopn Generic drug: liraglutide ADMINISTER 1.2 MG UNDER THE SKIN DAILY           Objective:   Physical Exam BP 138/66 (BP Location: Left Arm, Patient Position: Sitting, Cuff Size: Small)   Pulse 69   Temp 97.8 F (36.6 C) (  Oral)   Resp 18   Ht 5' 6"  (1.676 m)   Wt 177 lb 2 oz (80.3 kg)   SpO2 98%   BMI 28.59 kg/m  General:   Well developed, NAD, BMI noted. HEENT:  Normocephalic . Face symmetric, atraumatic Lungs:  CTA B Normal respiratory effort, no intercostal retractions, no accessory muscle use. Heart: RRR,  no murmur.  Lower extremities: no pretibial edema bilaterally  Skin: Not pale. Not jaundice Neurologic:  alert & oriented X3.  Speech normal, gait: Limps, uses a cane. Psych--  Cognition and judgment appear intact.  Cooperative with normal attention span and concentration.  Behavior appropriate. No anxious or depressed appearing.      Assessment    Assessment DM  Dr Dwyane Dee  +retinopathy  HTN Hyperlipidemia CKD: creat ~1.4  (GFR ~ 38) [place] 09-2019: No obstruction, bilateral cortical thinning and a small renal cysts Depression/anxiety: tranxene qhs prn (takes rarely) MSK: -DJD  - Osteoporosis ---T score -0.8 on December 2017, h/o a foot FX d/t walking years ago --- Hip Fx, 08/2018: started fosamax, switch to Prolia d/t CKD and diff swallowing, first dose 12-2019 Hem/Onc: Dr Marin Olp  -CLL -iron deficiency anemia: IV iron 2018 -B12 def CAD, Dr Gwenlyn Found [place] 97 >> CABG, cath 12-13-2006, myoview 2012 no ischemic CP: Stress test 08/05/2015, indeterminate risk study, + lateral ischemia: Cardiac catheterization 09/16/2015: Rx medical Venous insuff GI:  Dr Silverio Decamp ---GERD, IBS, h/o Gastritis ---H/o ulcerative colitis as a child H/o shingles  BCC Dr Allyson Sabal, bx 04-2015, MOH's 06-2016   PLAN HTN: BP today is okay, continue amlodipine, Tenormin, avapro, Imdur. CKD: Creatinine  fluctuates, few weeks ago it was 1.6 with a GFR of 32. DM: Saw endocrinology 05/11/2020 CLL: Saw oncology 05/07/2020, WBCs noted to be going upward, had anemia (iron def and or CLL related). They plan to recheck on her in 2 months CAD: Saw cardiology 03/17/2020, had echo and Myoview stress test 05/2019: All okay.  She has occasional atypical chest pain Osteoporosis: Prolia today. B12 deficiency: Labs today, currently taking a multivitamin with vitamin D supplement. Preventive care: S/p COVID VAX x4 RTC 6 months  This visit occurred during the SARS-CoV-2 public health emergency.  Safety protocols were in place, including screening questions prior to the visit, additional usage of staff PPE, and extensive cleaning of exam room while observing appropriate contact time as indicated for disinfecting solutions.

## 2020-06-15 NOTE — Telephone Encounter (Signed)
I called pt. When pt picked up test strips she was told that her insurance was needing PA for how rx was wrote. Pharmacy states they are sending PA paperwork over to fill out.

## 2020-06-15 NOTE — Assessment & Plan Note (Signed)
HTN: BP today is okay, continue amlodipine, Tenormin, avapro, Imdur. CKD: Creatinine fluctuates, few weeks ago it was 1.6 with a GFR of 32. DM: Saw endocrinology 05/11/2020 CLL: Saw oncology 05/07/2020, WBCs noted to be going upward, had anemia (iron def and or CLL related). They plan to recheck on her in 2 months CAD: Saw cardiology 03/17/2020, had echo and Myoview stress test 05/2019: All okay.  She has occasional atypical chest pain Osteoporosis: Prolia today. B12 deficiency: Labs today, currently taking a multivitamin with vitamin D supplement. Preventive care: S/p COVID VAX x4 RTC 6 months

## 2020-06-16 NOTE — Telephone Encounter (Signed)
Can we initiate?

## 2020-06-23 ENCOUNTER — Telehealth: Payer: Self-pay | Admitting: *Deleted

## 2020-06-23 ENCOUNTER — Inpatient Hospital Stay (HOSPITAL_BASED_OUTPATIENT_CLINIC_OR_DEPARTMENT_OTHER): Payer: Medicare Other | Admitting: Hematology & Oncology

## 2020-06-23 ENCOUNTER — Inpatient Hospital Stay: Payer: Medicare Other | Attending: Hematology & Oncology

## 2020-06-23 ENCOUNTER — Telehealth: Payer: Self-pay

## 2020-06-23 ENCOUNTER — Encounter: Payer: Self-pay | Admitting: Hematology & Oncology

## 2020-06-23 ENCOUNTER — Other Ambulatory Visit: Payer: Self-pay

## 2020-06-23 VITALS — BP 138/66 | HR 65 | Temp 98.1°F | Resp 20 | Wt 180.1 lb

## 2020-06-23 DIAGNOSIS — C9110 Chronic lymphocytic leukemia of B-cell type not having achieved remission: Secondary | ICD-10-CM | POA: Diagnosis not present

## 2020-06-23 DIAGNOSIS — Z85038 Personal history of other malignant neoplasm of large intestine: Secondary | ICD-10-CM | POA: Diagnosis not present

## 2020-06-23 DIAGNOSIS — D509 Iron deficiency anemia, unspecified: Secondary | ICD-10-CM | POA: Insufficient documentation

## 2020-06-23 DIAGNOSIS — K909 Intestinal malabsorption, unspecified: Secondary | ICD-10-CM

## 2020-06-23 LAB — CBC WITH DIFFERENTIAL (CANCER CENTER ONLY)
Abs Immature Granulocytes: 0.14 10*3/uL — ABNORMAL HIGH (ref 0.00–0.07)
Basophils Absolute: 0.1 10*3/uL (ref 0.0–0.1)
Basophils Relative: 0 %
Eosinophils Absolute: 0.2 10*3/uL (ref 0.0–0.5)
Eosinophils Relative: 0 %
HCT: 33.9 % — ABNORMAL LOW (ref 36.0–46.0)
Hemoglobin: 10.5 g/dL — ABNORMAL LOW (ref 12.0–15.0)
Immature Granulocytes: 0 %
Lymphocytes Relative: 87 %
Lymphs Abs: 75.8 10*3/uL — ABNORMAL HIGH (ref 0.7–4.0)
MCH: 30 pg (ref 26.0–34.0)
MCHC: 31 g/dL (ref 30.0–36.0)
MCV: 96.9 fL (ref 80.0–100.0)
Monocytes Absolute: 7.3 10*3/uL — ABNORMAL HIGH (ref 0.1–1.0)
Monocytes Relative: 8 %
Neutro Abs: 4.5 10*3/uL (ref 1.7–7.7)
Neutrophils Relative %: 5 %
Platelet Count: 171 10*3/uL (ref 150–400)
RBC: 3.5 MIL/uL — ABNORMAL LOW (ref 3.87–5.11)
RDW: 14.4 % (ref 11.5–15.5)
WBC Count: 88 10*3/uL (ref 4.0–10.5)
nRBC: 0 % (ref 0.0–0.2)

## 2020-06-23 LAB — CMP (CANCER CENTER ONLY)
ALT: 17 U/L (ref 0–44)
AST: 20 U/L (ref 15–41)
Albumin: 4 g/dL (ref 3.5–5.0)
Alkaline Phosphatase: 68 U/L (ref 38–126)
Anion gap: 8 (ref 5–15)
BUN: 22 mg/dL (ref 8–23)
CO2: 27 mmol/L (ref 22–32)
Calcium: 8.8 mg/dL — ABNORMAL LOW (ref 8.9–10.3)
Chloride: 107 mmol/L (ref 98–111)
Creatinine: 1.42 mg/dL — ABNORMAL HIGH (ref 0.44–1.00)
GFR, Estimated: 37 mL/min — ABNORMAL LOW (ref >60)
Glucose, Bld: 99 mg/dL (ref 70–99)
Potassium: 3.9 mmol/L (ref 3.5–5.1)
Sodium: 142 mmol/L (ref 135–145)
Total Bilirubin: 0.6 mg/dL (ref 0.3–1.2)
Total Protein: 6.4 g/dL — ABNORMAL LOW (ref 6.5–8.1)

## 2020-06-23 LAB — FERRITIN: Ferritin: 104 ng/mL (ref 11–307)

## 2020-06-23 LAB — IRON AND TIBC
Iron: 77 ug/dL (ref 41–142)
Saturation Ratios: 27 % (ref 21–57)
TIBC: 281 ug/dL (ref 236–444)
UIBC: 204 ug/dL (ref 120–384)

## 2020-06-23 LAB — RETICULOCYTES
Immature Retic Fract: 9.3 % (ref 2.3–15.9)
RBC.: 3.52 MIL/uL — ABNORMAL LOW (ref 3.87–5.11)
Retic Count, Absolute: 59.1 10*3/uL (ref 19.0–186.0)
Retic Ct Pct: 1.7 % (ref 0.4–3.1)

## 2020-06-23 LAB — LACTATE DEHYDROGENASE: LDH: 182 U/L (ref 98–192)

## 2020-06-23 NOTE — Telephone Encounter (Signed)
As noted below by Dr. Marin Olp, I informed the patient that her iron studies are good. She verbalized understanding.

## 2020-06-23 NOTE — Progress Notes (Signed)
Hematology and Oncology Follow Up Visit  [name] [id number] 05/27/1940 80 y.o. 06/23/2020   Principle Diagnosis:  CLL -stage A Remote history of colon cancer Iron deficiency anemia  Current Therapy:   IV iron as indicated-patient received a dose in 01/2019   Interim History: [name] is here today for follow-up.  Her main complaint is that she is been tired.  I suspect this might be from iron deficiency.  Back in May, she had a ferritin of 66 with an iron saturation of 18%.,  Sure she got iron at that time.  We will have to see what her iron levels are.  The other reason for her to be tired might be from the CLL itself.  Her white cell count is up a little bit at 88,000.  Her platelet count is still okay.  This certainly needs to be looked at.  Again, we can certainly institute therapy if we had to.  I do believe that is coming deficiency that will be the issue and we give her back IV iron and this should help.  She still is dealing with the left hip.  She has some discomfort in the left hip.  I think she had surgery for this 2 years ago.     Overall, she has had no problems with nausea or vomiting.  There is been no cough.  She has been avoiding the COVID.  She is very diligent with this.  She has had no rashes.  There is been no leg swelling.  Overall, I would say performance status is probably ECOG 1.      Medications:  Allergies as of 06/23/2020       Reactions   Tramadol    Insomnia    Codeine    REACTION: makes her nervous, orTylenol #3   Ibuprofen    REACTION: nervous   Meperidine Hcl    REACTION: nasuea and vomitting   Naproxen Sodium    REACTION: nervous        Medication List        Accurate as of June 23, 2020  9:47 AM. If you have any questions, ask your nurse or doctor.          amLODipine 5 MG tablet Commonly known as: NORVASC TAKE 1 TABLET(5 MG) BY MOUTH DAILY   aspirin 81 MG chewable tablet Chew 1 tablet (81 mg total) by  mouth 2 (two) times daily with a meal. What changed: when to take this   atenolol 100 MG tablet Commonly known as: TENORMIN TAKE 1 TABLET(100 MG) BY MOUTH DAILY   atorvastatin 20 MG tablet Commonly known as: LIPITOR TAKE 1 TABLET(20 MG) BY MOUTH DAILY   azelastine 0.1 % nasal spray Commonly known as: ASTELIN Place 2 sprays into both nostrils at bedtime as needed for rhinitis or allergies. Use in each nostril as directed   cholecalciferol 1000 units tablet Commonly known as: VITAMIN D Take 1,000 Units by mouth daily.   clopidogrel 75 MG tablet Commonly known as: PLAVIX Take 1 tablet (75 mg total) by mouth daily.   clorazepate 7.5 MG tablet Commonly known as: TRANXENE Take 1 tablet (7.5 mg total) by mouth as needed.   fluticasone 50 MCG/ACT nasal spray Commonly known as: FLONASE SHAKE LIQUID AND USE 2 SPRAYS IN EACH NOSTRIL DAILY   insulin NPH Human 100 UNIT/ML injection Commonly known as: NOVOLIN N Inject 38 units into the skin every morning and inject 24 units into the skin at bedtime.     insulin regular 100 units/mL injection Commonly known as: NOVOLIN R Inject 14 Units into the skin 2 (two) times daily before a meal.   INSULIN SYRINGE .5CC/31GX5/16" 31G X 5/16" 0.5 ML Misc USE TO INJECT INSULIN 5 TIMES PER DAY   irbesartan 300 MG tablet Commonly known as: Avapro Take 0.5 tablets (150 mg total) by mouth daily.   isosorbide mononitrate 30 MG 24 hr tablet Commonly known as: IMDUR TAKE 1 TABLET BY MOUTH EVERY DAY   metFORMIN 500 MG tablet Commonly known as: GLUCOPHAGE Take 1 tablet (500 mg total) by mouth daily with supper.   mometasone 0.1 % cream Commonly known as: ELOCON Apply 1 application topically daily. Use as directed   multivitamin capsule Take 1 capsule by mouth daily.   nitroGLYCERIN 0.3 MG SL tablet Commonly known as: Nitrostat Place 1 tablet (0.3 mg total) under the tongue every 5 (five) minutes as needed for chest pain (ER if no better after 3  tablets).   omeprazole 20 MG capsule Commonly known as: PRILOSEC TAKE 1 CAPSULE(20 MG) BY MOUTH DAILY   ONE TOUCH ULTRA SYSTEM KIT w/Device Kit 1 kit by Does not apply route once.   OneTouch Ultra test strip Generic drug: glucose blood USE TO TEST BLOOD SUGAR 3 TIMES DAILY Dx code E11.65   Victoza 18 MG/3ML Sopn Generic drug: liraglutide ADMINISTER 1.2 MG UNDER THE SKIN DAILY What changed: See the new instructions.        Allergies:  Allergies  Allergen Reactions   Tramadol     Insomnia    Codeine     REACTION: makes her nervous, orTylenol #3   Ibuprofen     REACTION: nervous   Meperidine Hcl     REACTION: nasuea and vomitting   Naproxen Sodium     REACTION: nervous    Past Medical History, Surgical history, Social history, and Family History were reviewed and updated.  Review of Systems: Review of Systems  Constitutional: Negative.   HENT: Negative.    Eyes: Negative.   Respiratory: Negative.    Cardiovascular: Negative.   Gastrointestinal: Negative.   Genitourinary: Negative.   Musculoskeletal: Negative.   Skin: Negative.   Neurological: Negative.   Endo/Heme/Allergies: Negative.   Psychiatric/Behavioral: Negative.      Physical Exam:  weight is 180 lb 1.9 oz (81.7 kg). Her oral temperature is 98.1 F (36.7 C). Her blood pressure is 138/66 and her pulse is 65. Her respiration is 20 and oxygen saturation is 98%.   Wt Readings from Last 3 Encounters:  06/23/20 180 lb 1.9 oz (81.7 kg)  06/14/20 177 lb 2 oz (80.3 kg)  05/11/20 184 lb 3.2 oz (83.6 kg)    Physical Exam Vitals reviewed.  HENT:     Head: Normocephalic and atraumatic.  Eyes:     Pupils: Pupils are equal, round, and reactive to light.  Cardiovascular:     Rate and Rhythm: Normal rate and regular rhythm.     Heart sounds: Normal heart sounds.  Pulmonary:     Effort: Pulmonary effort is normal.     Breath sounds: Normal breath sounds.  Abdominal:     General: Bowel sounds are  normal.     Palpations: Abdomen is soft.  Musculoskeletal:        General: No tenderness or deformity. Normal range of motion.     Cervical back: Normal range of motion.  Lymphadenopathy:     Cervical: No cervical adenopathy.  Skin:    General: Skin is warm   and dry.     Findings: No erythema or rash.  Neurological:     Mental Status: She is alert and oriented to person, place, and time.  Psychiatric:        Behavior: Behavior normal.        Thought Content: Thought content normal.        Judgment: Judgment normal.     Lab Results  Component Value Date   WBC 88.0 (HH) 06/23/2020   HGB 10.5 (L) 06/23/2020   HCT 33.9 (L) 06/23/2020   MCV 96.9 06/23/2020   PLT 171 06/23/2020   Lab Results  Component Value Date   FERRITIN 66 05/07/2020   IRON 60 05/07/2020   TIBC 330 05/07/2020   UIBC 270 05/07/2020   IRONPCTSAT 18 (L) 05/07/2020   Lab Results  Component Value Date   RETICCTPCT 1.7 06/23/2020   RBC 3.52 (L) 06/23/2020   Lab Results  Component Value Date   KAPLAMBRATIO 1.22 11/03/2016   Lab Results  Component Value Date   IGGSERUM 775 05/07/2020   IGA 158 05/07/2020   IGMSERUM 18 (L) 05/07/2020   Lab Results  Component Value Date   TOTALPROTELP 7.2 02/25/2014   ALBUMINELP 54.3 (L) 02/25/2014   A1GS 4.4 02/25/2014   A2GS 13.7 (H) 02/25/2014   BETS 7.0 02/25/2014   BETA2SER 6.2 02/25/2014   GAMS 14.4 02/25/2014   MSPIKE Not Observed 11/03/2016   SPEI * 02/25/2014     Chemistry      Component Value Date/Time   NA 142 06/23/2020 0844   NA 147 (H) 11/03/2016 1018   NA 143 08/30/2016 0954   K 3.9 06/23/2020 0844   K 3.9 11/03/2016 1018   K 4.5 08/30/2016 0954   CL 107 06/23/2020 0844   CL 110 (H) 11/03/2016 1018   CO2 27 06/23/2020 0844   CO2 27 11/03/2016 1018   CO2 24 08/30/2016 0954   BUN 22 06/23/2020 0844   BUN 25 (H) 11/03/2016 1018   BUN 20.5 08/30/2016 0954   CREATININE 1.42 (H) 06/23/2020 0844   CREATININE 1.6 (H) 11/03/2016 1018    CREATININE 1.4 (H) 08/30/2016 0954      Component Value Date/Time   CALCIUM 8.8 (L) 06/23/2020 0844   CALCIUM 10.1 11/03/2016 1018   CALCIUM 9.8 08/30/2016 0954   ALKPHOS 68 06/23/2020 0844   ALKPHOS 112 (H) 11/03/2016 1018   ALKPHOS 113 08/30/2016 0954   AST 20 06/23/2020 0844   AST 23 08/30/2016 0954   ALT 17 06/23/2020 0844   ALT 29 11/03/2016 1018   ALT 20 08/30/2016 0954   BILITOT 0.6 06/23/2020 0844   BILITOT 0.38 08/30/2016 0954      Impression and Plan: Ms. Ealey is a very pleasant 80 yo caucasian female with CLL stage A.  Unfortunately, I have noted that her white cell count is trending upward.  I do suspect that the anemia is probably more so from iron deficiency.  It could certainly be from the CLL.  Also need to mention that she does have some renal insufficiency.  She can certainly have erythropoietin deficiency.  We will have to keep this in mind.  I would like to have her come back in a couple months.  I would like to think that her iron is going to be on the low side.  If it is we will definitely give her IV iron.  I know that I want her quality life to be a little bit better.  She  deserves this.  It is summertime.  She wants to be active.   [name], MD 6/22/20229:47 AM

## 2020-06-23 NOTE — Telephone Encounter (Signed)
Appts made and printed for pt per 06/23/20 los  [name]

## 2020-06-23 NOTE — Telephone Encounter (Signed)
Critical result WBC 88,000 reported by Sharee Pimple in lab.  Dr Marin Olp notified.  Saw patient in the office today

## 2020-06-23 NOTE — Telephone Encounter (Signed)
-----   Message from [name], MD sent at 06/23/2020  1:39 PM EDT ----- Please call and let her know that her iron levels are actually pretty good.  Hopefully, her fatigue will get better.  Thanks.  [name]

## 2020-06-24 LAB — IGG, IGA, IGM
IgA: 154 mg/dL (ref 64–422)
IgG (Immunoglobin G), Serum: 802 mg/dL (ref 586–1602)
IgM (Immunoglobulin M), Srm: 19 mg/dL — ABNORMAL LOW (ref 26–217)

## 2020-06-24 LAB — KAPPA/LAMBDA LIGHT CHAINS
Kappa free light chain: 56.5 mg/L — ABNORMAL HIGH (ref 3.3–19.4)
Kappa, lambda light chain ratio: 1.96 — ABNORMAL HIGH (ref 0.26–1.65)
Lambda free light chains: 28.9 mg/L — ABNORMAL HIGH (ref 5.7–26.3)

## 2020-06-24 LAB — PROTEIN ELECTROPHORESIS, SERUM, WITH REFLEX
A/G Ratio: 1.4 (ref 0.7–1.7)
Albumin ELP: 3.6 g/dL (ref 2.9–4.4)
Alpha-1-Globulin: 0.2 g/dL (ref 0.0–0.4)
Alpha-2-Globulin: 0.9 g/dL (ref 0.4–1.0)
Beta Globulin: 0.9 g/dL (ref 0.7–1.3)
Gamma Globulin: 0.7 g/dL (ref 0.4–1.8)
Globulin, Total: 2.6 g/dL (ref 2.2–3.9)
Total Protein ELP: 6.2 g/dL (ref 6.0–8.5)

## 2020-06-24 LAB — ERYTHROPOIETIN: Erythropoietin: 16 m[IU]/mL (ref 2.6–18.5)

## 2020-06-27 ENCOUNTER — Other Ambulatory Visit: Payer: Self-pay | Admitting: Endocrinology

## 2020-07-05 ENCOUNTER — Other Ambulatory Visit: Payer: Self-pay | Admitting: Cardiovascular Disease

## 2020-07-06 DIAGNOSIS — Q141 Congenital malformation of retina: Secondary | ICD-10-CM | POA: Diagnosis not present

## 2020-07-06 DIAGNOSIS — E113491 Type 2 diabetes mellitus with severe nonproliferative diabetic retinopathy without macular edema, right eye: Secondary | ICD-10-CM | POA: Diagnosis not present

## 2020-07-06 DIAGNOSIS — H26493 Other secondary cataract, bilateral: Secondary | ICD-10-CM | POA: Diagnosis not present

## 2020-07-06 DIAGNOSIS — E113312 Type 2 diabetes mellitus with moderate nonproliferative diabetic retinopathy with macular edema, left eye: Secondary | ICD-10-CM | POA: Diagnosis not present

## 2020-07-07 LAB — FISH,CLL PROGNOSTIC PANEL

## 2020-07-08 LAB — IGVH SOMATIC HYPERMUTATION

## 2020-07-14 DIAGNOSIS — C44311 Basal cell carcinoma of skin of nose: Secondary | ICD-10-CM | POA: Diagnosis not present

## 2020-07-27 ENCOUNTER — Other Ambulatory Visit: Payer: Self-pay | Admitting: Internal Medicine

## 2020-07-28 DIAGNOSIS — L821 Other seborrheic keratosis: Secondary | ICD-10-CM | POA: Diagnosis not present

## 2020-07-29 ENCOUNTER — Other Ambulatory Visit: Payer: Self-pay | Admitting: Endocrinology

## 2020-08-16 ENCOUNTER — Other Ambulatory Visit (HOSPITAL_COMMUNITY): Payer: Self-pay

## 2020-08-16 ENCOUNTER — Encounter: Payer: Self-pay | Admitting: Family

## 2020-08-24 ENCOUNTER — Inpatient Hospital Stay (HOSPITAL_BASED_OUTPATIENT_CLINIC_OR_DEPARTMENT_OTHER): Payer: Medicare Other | Admitting: Hematology & Oncology

## 2020-08-24 ENCOUNTER — Other Ambulatory Visit (HOSPITAL_COMMUNITY): Payer: Self-pay

## 2020-08-24 ENCOUNTER — Inpatient Hospital Stay: Payer: Medicare Other | Attending: Hematology & Oncology

## 2020-08-24 ENCOUNTER — Encounter: Payer: Self-pay | Admitting: Hematology & Oncology

## 2020-08-24 ENCOUNTER — Telehealth: Payer: Self-pay | Admitting: Pharmacy Technician

## 2020-08-24 ENCOUNTER — Telehealth: Payer: Self-pay

## 2020-08-24 ENCOUNTER — Other Ambulatory Visit: Payer: Self-pay

## 2020-08-24 ENCOUNTER — Telehealth: Payer: Self-pay | Admitting: *Deleted

## 2020-08-24 VITALS — BP 150/64 | HR 70 | Temp 98.0°F | Resp 18 | Wt 179.0 lb

## 2020-08-24 DIAGNOSIS — R768 Other specified abnormal immunological findings in serum: Secondary | ICD-10-CM

## 2020-08-24 DIAGNOSIS — D509 Iron deficiency anemia, unspecified: Secondary | ICD-10-CM | POA: Diagnosis not present

## 2020-08-24 DIAGNOSIS — C9110 Chronic lymphocytic leukemia of B-cell type not having achieved remission: Secondary | ICD-10-CM | POA: Diagnosis not present

## 2020-08-24 DIAGNOSIS — Z85038 Personal history of other malignant neoplasm of large intestine: Secondary | ICD-10-CM | POA: Insufficient documentation

## 2020-08-24 DIAGNOSIS — R748 Abnormal levels of other serum enzymes: Secondary | ICD-10-CM | POA: Diagnosis not present

## 2020-08-24 LAB — CMP (CANCER CENTER ONLY)
ALT: 13 U/L (ref 0–44)
AST: 18 U/L (ref 15–41)
Albumin: 4 g/dL (ref 3.5–5.0)
Alkaline Phosphatase: 85 U/L (ref 38–126)
Anion gap: 7 (ref 5–15)
BUN: 17 mg/dL (ref 8–23)
CO2: 26 mmol/L (ref 22–32)
Calcium: 9 mg/dL (ref 8.9–10.3)
Chloride: 109 mmol/L (ref 98–111)
Creatinine: 1.43 mg/dL — ABNORMAL HIGH (ref 0.44–1.00)
GFR, Estimated: 37 mL/min — ABNORMAL LOW (ref >60)
Glucose, Bld: 198 mg/dL — ABNORMAL HIGH (ref 70–99)
Potassium: 4.5 mmol/L (ref 3.5–5.1)
Sodium: 142 mmol/L (ref 135–145)
Total Bilirubin: 0.5 mg/dL (ref 0.3–1.2)
Total Protein: 6.6 g/dL (ref 6.5–8.1)

## 2020-08-24 LAB — CBC WITH DIFFERENTIAL (CANCER CENTER ONLY)
Abs Immature Granulocytes: 0 10*3/uL (ref 0.00–0.07)
Band Neutrophils: 0 %
Basophils Absolute: 0 10*3/uL (ref 0.0–0.1)
Basophils Relative: 0 %
Blasts: 0 %
Eosinophils Absolute: 0 10*3/uL (ref 0.0–0.5)
Eosinophils Relative: 0 %
HCT: 33.8 % — ABNORMAL LOW (ref 36.0–46.0)
Hemoglobin: 10.5 g/dL — ABNORMAL LOW (ref 12.0–15.0)
Lymphocytes Relative: 93 %
Lymphs Abs: 100.4 10*3/uL — ABNORMAL HIGH (ref 0.7–4.0)
MCH: 30 pg (ref 26.0–34.0)
MCHC: 31.1 g/dL (ref 30.0–36.0)
MCV: 96.6 fL (ref 80.0–100.0)
Metamyelocytes Relative: 0 %
Monocytes Absolute: 1.1 10*3/uL — ABNORMAL HIGH (ref 0.1–1.0)
Monocytes Relative: 1 %
Myelocytes: 0 %
Neutro Abs: 6.5 10*3/uL (ref 1.7–7.7)
Neutrophils Relative %: 6 %
Other: 0 %
Platelet Count: 209 10*3/uL (ref 150–400)
Promyelocytes Relative: 0 %
RBC: 3.5 MIL/uL — ABNORMAL LOW (ref 3.87–5.11)
RDW: 14.6 % (ref 11.5–15.5)
Smear Review: ABNORMAL
WBC Count: 108 10*3/uL (ref 4.0–10.5)
nRBC: 0 % (ref 0.0–0.2)
nRBC: 0 /100 WBC

## 2020-08-24 LAB — IRON AND TIBC
Iron: 81 ug/dL (ref 41–142)
Saturation Ratios: 26 % (ref 21–57)
TIBC: 310 ug/dL (ref 236–444)
UIBC: 229 ug/dL (ref 120–384)

## 2020-08-24 LAB — RETICULOCYTES
Immature Retic Fract: 11.3 % (ref 2.3–15.9)
RBC.: 3.53 MIL/uL — ABNORMAL LOW (ref 3.87–5.11)
Retic Count, Absolute: 65.7 10*3/uL (ref 19.0–186.0)
Retic Ct Pct: 1.9 % (ref 0.4–3.1)

## 2020-08-24 LAB — FERRITIN: Ferritin: 90 ng/mL (ref 11–307)

## 2020-08-24 MED ORDER — ACALABRUTINIB 100 MG PO CAPS
100.0000 mg | ORAL_CAPSULE | Freq: Every day | ORAL | 6 refills | Status: DC
Start: 2020-08-24 — End: 2020-09-10
  Filled 2020-08-24: qty 30, 30d supply, fill #0

## 2020-08-24 NOTE — Progress Notes (Signed)
Hematology and Oncology Follow Up Visit  Lawrence [id number] [birth year] 80 y.o. 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 Economou is here today for follow-up.  Unfortunately, I think going to have to initiate treatment on her.  Her white cell count is going up more quickly.  I think we are clearly in a point were going to have to do treatment on her.  She actually looks and feels pretty well.  She has had some fatigue in the past.  She has had some anemia.  She has had some iron deficiency that we have treated.  She has not noted any swollen lymph nodes.  She has had no weight loss or weight gain.  She has had no obvious change in bowel or bladder habits..  She still is getting along after she broke her left hip.  She still uses a cane.  She does seem to manage pretty well.  She has had no issues with fever.  She has had no bleeding.  There has been no nausea or vomiting.  Her appetite remains good.  I think that we can probably treat her with Rituxan/acalabrutinib.  At some point, we may think about adding venetoclax.  I think this would be a very reasonable protocol for her.  I would only do daily acalabrutinib and not twice daily acalabrutinib to start just because of her age.  Currently, I would say her performance status is probably ECOG 1.    Medications:  Allergies as of 08/24/2020       Reactions   Tramadol    Insomnia    Codeine    REACTION: makes her nervous, orTylenol #3   Ibuprofen    REACTION: nervous   Meperidine Hcl    REACTION: nasuea and vomitting   Naproxen Sodium    REACTION: nervous        Medication List        Accurate as of August 24, 2020  1:18 PM. If you have any questions, ask your nurse or doctor.           amLODipine 5 MG tablet Commonly known as: NORVASC TAKE 1 TABLET(5 MG) BY MOUTH DAILY   aspirin 81 MG chewable tablet Chew 1 tablet (81 mg total) by mouth 2 (two) times daily with a meal. What changed: when to take this   atenolol 100 MG tablet Commonly known as: TENORMIN TAKE 1 TABLET(100 MG) BY MOUTH DAILY   atorvastatin 20 MG tablet Commonly known as: LIPITOR TAKE 1 TABLET(20 MG) BY MOUTH DAILY   azelastine 0.1 % nasal spray Commonly known as: ASTELIN Place 2 sprays into both nostrils at bedtime as needed for rhinitis or allergies. Use in each nostril as directed   cholecalciferol 1000 units tablet Commonly known as: VITAMIN D Take 1,000 Units by mouth daily.   clopidogrel 75 MG tablet Commonly known as: PLAVIX Take 1 tablet (75 mg total) by mouth daily.   clorazepate 7.5 MG tablet Commonly known as: TRANXENE Take 1 tablet (7.5 mg total) by mouth as needed.   fluticasone 50 MCG/ACT nasal spray Commonly known as: FLONASE SHAKE LIQUID AND USE 2 SPRAYS IN EACH NOSTRIL DAILY   insulin NPH Human 100 UNIT/ML injection Commonly known as: NOVOLIN N Inject 38 units into  the skin every morning and inject 24 units into the skin at bedtime.   insulin regular 100 units/mL injection Commonly known as: NOVOLIN R Inject 14 Units into the skin 2 (two) times daily before a meal.   INSULIN SYRINGE .5CC/31GX5/16" 31G X 5/16" 0.5 ML Misc USE TO INJECT INSULIN 5 TIMES PER DAY   irbesartan 300 MG tablet Commonly known as: Avapro Take 0.5 tablets (150 mg total) by mouth daily.   isosorbide mononitrate 30 MG 24 hr tablet Commonly known as: IMDUR TAKE 1 TABLET BY MOUTH EVERY DAY   metFORMIN 500 MG tablet Commonly known as: GLUCOPHAGE TAKE 1 TABLET(500 MG) BY MOUTH DAILY WITH SUPPER   mometasone 0.1 % cream Commonly known as: ELOCON Apply 1 application topically daily. Use as directed   multivitamin capsule Take 1 capsule by mouth daily.   nitroGLYCERIN 0.3 MG SL  tablet Commonly known as: Nitrostat Place 1 tablet (0.3 mg total) under the tongue every 5 (five) minutes as needed for chest pain (ER if no better after 3 tablets).   omeprazole 20 MG capsule Commonly known as: PRILOSEC TAKE 1 CAPSULE(20 MG) BY MOUTH DAILY   ONE TOUCH ULTRA SYSTEM KIT w/Device Kit 1 kit by Does not apply route once.   OneTouch Ultra test strip Generic drug: glucose blood USE TO TEST BLOOD SUGAR 3 TIMES DAILY Dx code E11.65   Victoza 18 MG/3ML Sopn Generic drug: liraglutide ADMINISTER 1.2 MG UNDER THE SKIN DAILY        Allergies:  Allergies  Allergen Reactions   Tramadol     Insomnia    Codeine     REACTION: makes her nervous, orTylenol #3   Ibuprofen     REACTION: nervous   Meperidine Hcl     REACTION: nasuea and vomitting   Naproxen Sodium     REACTION: nervous    Past Medical History, Surgical history, Social history, and Family History were reviewed and updated.  Review of Systems: Review of Systems  Constitutional: Negative.   HENT: Negative.    Eyes: Negative.   Respiratory: Negative.    Cardiovascular: Negative.   Gastrointestinal: Negative.   Genitourinary: Negative.   Musculoskeletal: Negative.   Skin: Negative.   Neurological: Negative.   Endo/Heme/Allergies: Negative.   Psychiatric/Behavioral: Negative.      Physical Exam:  weight is 179 lb 0.6 oz (81.2 kg). Her oral temperature is 98 F (36.7 C). Her blood pressure is 150/64 (abnormal) and her pulse is 70. Her respiration is 18 and oxygen saturation is 99%.   Wt Readings from Last 3 Encounters:  08/24/20 179 lb 0.6 oz (81.2 kg)  06/23/20 180 lb 1.9 oz (81.7 kg)  06/14/20 177 lb 2 oz (80.3 kg)    Physical Exam Vitals reviewed.  HENT:     Head: Normocephalic and atraumatic.  Eyes:     Pupils: Pupils are equal, round, and reactive to light.  Cardiovascular:     Rate and Rhythm: Normal rate and regular rhythm.     Heart sounds: Normal heart sounds.  Pulmonary:      Effort: Pulmonary effort is normal.     Breath sounds: Normal breath sounds.  Abdominal:     General: Bowel sounds are normal.     Palpations: Abdomen is soft.  Musculoskeletal:        General: No tenderness or deformity. Normal range of motion.     Cervical back: Normal range of motion.  Lymphadenopathy:     Cervical: No cervical adenopathy.  Skin:    General: Skin is warm and dry.     Findings: No erythema or rash.  Neurological:     Mental Status: She is alert and oriented to person, place, and time.  Psychiatric:        Behavior: Behavior normal.        Thought Content: Thought content normal.        Judgment: Judgment normal.     Lab Results  Component Value Date   WBC 108.0 (HH) 08/24/2020   HGB 10.5 (L) 08/24/2020   HCT 33.8 (L) 08/24/2020   MCV 96.6 08/24/2020   PLT 209 08/24/2020   Lab Results  Component Value Date   FERRITIN 104 06/23/2020   IRON 81 08/24/2020   TIBC 310 08/24/2020   UIBC 229 08/24/2020   IRONPCTSAT 26 08/24/2020   Lab Results  Component Value Date   RETICCTPCT 1.9 08/24/2020   RBC 3.50 (L) 08/24/2020   Lab Results  Component Value Date   KPAFRELGTCHN 56.5 (H) 06/23/2020   LAMBDASER 28.9 (H) 06/23/2020   KAPLAMBRATIO 1.96 (H) 06/23/2020   Lab Results  Component Value Date   IGGSERUM 802 06/23/2020   IGA 154 06/23/2020   IGMSERUM 19 (L) 06/23/2020   Lab Results  Component Value Date   TOTALPROTELP 6.2 06/23/2020   ALBUMINELP 3.6 06/23/2020   A1GS 0.2 06/23/2020   A2GS 0.9 06/23/2020   BETS 0.9 06/23/2020   BETA2SER 6.2 02/25/2014   GAMS 0.7 06/23/2020   MSPIKE Not Observed 06/23/2020   SPEI * 02/25/2014     Chemistry      Component Value Date/Time   NA 142 08/24/2020 0947   NA 147 (H) 11/03/2016 1018   NA 143 08/30/2016 0954   K 4.5 08/24/2020 0947   K 3.9 11/03/2016 1018   K 4.5 08/30/2016 0954   CL 109 08/24/2020 0947   CL 110 (H) 11/03/2016 1018   CO2 26 08/24/2020 0947   CO2 27 11/03/2016 1018   CO2 24  08/30/2016 0954   BUN 17 08/24/2020 0947   BUN 25 (H) 11/03/2016 1018   BUN 20.5 08/30/2016 0954   CREATININE 1.43 (H) 08/24/2020 0947   CREATININE 1.6 (H) 11/03/2016 1018   CREATININE 1.4 (H) 08/30/2016 0954      Component Value Date/Time   CALCIUM 9.0 08/24/2020 0947   CALCIUM 10.1 11/03/2016 1018   CALCIUM 9.8 08/30/2016 0954   ALKPHOS 85 08/24/2020 0947   ALKPHOS 112 (H) 11/03/2016 1018   ALKPHOS 113 08/30/2016 0954   AST 18 08/24/2020 0947   AST 23 08/30/2016 0954   ALT 13 08/24/2020 0947   ALT 29 11/03/2016 1018   ALT 20 08/30/2016 0954   BILITOT 0.5 08/24/2020 0947   BILITOT 0.38 08/30/2016 0954      Impression and Plan: Ms. [name] is a very pleasant 80 yo caucasian female with CLL stage A.  So far, we have not had any negative prognostic markers.  She does have the 13q-chromosomal abnormality.  This is typically construed as a good prognostic marker.  I do think were going to have to get a CT scan on her.  The last scan that we did on it was back in 2018 which looked okay.  Again, I think that Rituxan/acalabrutinib would be a reasonable way to go.  I think we can get a good response.  I think we can get her white cell count down.  This may help her anemia.  I did go over the  side effects of treatment.  I did give her some information sheets about each treatment.  I explained how the acalabrutinib works.  I think that she should be able to tolerate this pretty well.  Again we will start her off on a daily dose and not a twice daily dosing.  I explained Rituxan.  She will get this once a month.  The first treatment will take about 6 hours because we have to give it slowly as she would have side effects with the first treatment.  I truly believe that we can get her back into a good remission.  Hopefully, this will improve her blood counts as she may feel a little bit better.  I do not think we have to start until after the Labor Day weekend.  Even though the white cell  count is drifting upward, I do not see that we have to initiate treatment emergently.  I would like to see her back when she does start the Rituxan.   [name], MD 8/23/20221:18 PM

## 2020-08-24 NOTE — Telephone Encounter (Signed)
Oral Oncology Patient Advocate Encounter   Received notification from [employer] that prior authorization for Calquence is required.   PA submitted on CoverMyMeds Key [id number]  Status is pending   Oral Oncology Clinic will continue to follow.  [name] Patient [place] Phone [phone] Fax [phone] 08/24/2020 4:02 PM

## 2020-08-24 NOTE — Telephone Encounter (Signed)
Oral Oncology Patient Advocate Encounter  Prior Authorization for Calquence has been approved.    PA# [id number] Effective dates: 08/24/20 through 01/01/21  Patients co-pay is $1878.76.  Oral Oncology Clinic will continue to follow.   [place] Patient [place] Phone [phone] Fax [phone] 08/24/2020 4:17 PM

## 2020-08-24 NOTE — Telephone Encounter (Signed)
Dr. Marin Olp notified of WBC-108.  No new orders received at this time.

## 2020-08-24 NOTE — Progress Notes (Signed)
START OFF PATHWAY REGIMEN - Lymphoma and CLL   [id number]:Rituximab (IV/Subcut) D1 Weekly:   A cycle is every 7 days:     Rituximab-xxxx      Rituximab and hyaluronidase human   **Always confirm dose/schedule in your pharmacy ordering system**  Patient Characteristics: Chronic Lymphocytic Leukemia (CLL), Treatment Indicated, First Line, 17p del(-) and No ATM Deletion and TP53 Mutation Negative, Age ? 7 or Frail (Any Age) Disease Type: Chronic Lymphocytic Leukemia (CLL) Disease Type: Not Applicable Disease Type: Not Applicable Treatment Indicated<= Treatment Indicated Line of Therapy: First Line ATM (11q22) Deletion Status: No Deletion 17p Deletion Status: Negative TP53 Mutation Status: Negative Patient Age: ? 2 Patient Condition: Fit Patient Intent of Therapy: Non-Curative / Palliative Intent, Discussed with Patient

## 2020-08-25 ENCOUNTER — Telehealth: Payer: Self-pay | Admitting: Pharmacist

## 2020-08-25 DIAGNOSIS — C9110 Chronic lymphocytic leukemia of B-cell type not having achieved remission: Secondary | ICD-10-CM

## 2020-08-25 LAB — ERYTHROPOIETIN: Erythropoietin: 12.1 m[IU]/mL (ref 2.6–18.5)

## 2020-08-25 NOTE — Telephone Encounter (Addendum)
Oral Oncology Pharmacist Encounter  Received new prescription for Calquence (acalabrutinib) for the treatment of CLL now requiring treatment in conjunction with rituximab, planned duration until disease progression or unacceptable drug toxicity. Planned start 09/20/20  Prescription dose and frequency assessed. MD plans to start her on a reduced dose and increase if tolerated.  Current medication list in Epic reviewed, a few DDIs with acalabrutinib identified: Aspirin and clopidogrel: acalabrutinib may enhance the antiplatelet effect of agents with antiplatelet properties, like aspirin and clopidogrel. Monitor pltc and for s/sx of bleeding Omeprazole: PPIs may may decrease the serum concentration of acalabrutinib capsules, category X interaction. Omeprazole will need to be discontinued. Once acalabrutinib tablets are available, this will not a relevant DDIs  Evaluated chart and no patient barriers to medication adherence identified.   Prescription has been e-scribed to the [facility] for benefits analysis and approval.  Oral Oncology Clinic will continue to follow for insurance authorization, copayment issues, initial counseling and start date.   Darl Pikes, PharmD, BCPS, BCOP, CPP Hematology/Oncology Clinical Pharmacist Practitioner [facility] [phone]  08/25/2020 9:14 AM

## 2020-08-27 ENCOUNTER — Telehealth: Payer: Self-pay | Admitting: Pharmacy Technician

## 2020-08-27 ENCOUNTER — Other Ambulatory Visit (HOSPITAL_COMMUNITY): Payer: Self-pay

## 2020-08-27 ENCOUNTER — Encounter: Payer: Self-pay | Admitting: Hematology & Oncology

## 2020-08-27 ENCOUNTER — Encounter: Payer: Self-pay | Admitting: Family

## 2020-08-27 NOTE — Telephone Encounter (Signed)
Oral Oncology Patient Advocate Encounter   Was successful in securing patient an $74 grant from [facility]) to provide copayment coverage for Calquence.  This will keep the out of pocket expense at $0.     The billing information is as follows and has been shared with [facility].   Member ID: [id number] Group ID: [id number] RxBin: [id number] Dates of Eligibility: 05/29/20 through 08/26/21  Fund:  [place] Patient [place] Phone [phone] Fax [phone] 08/27/2020 10:04 AM

## 2020-08-30 ENCOUNTER — Other Ambulatory Visit: Payer: Self-pay | Admitting: Internal Medicine

## 2020-09-01 ENCOUNTER — Other Ambulatory Visit: Payer: Self-pay

## 2020-09-01 ENCOUNTER — Ambulatory Visit (HOSPITAL_BASED_OUTPATIENT_CLINIC_OR_DEPARTMENT_OTHER)
Admission: RE | Admit: 2020-09-01 | Discharge: 2020-09-01 | Disposition: A | Discharge status: Home / Self Care | Payer: Medicare Other | Source: Ambulatory Visit | Attending: Hematology & Oncology | Admitting: Hematology & Oncology

## 2020-09-01 DIAGNOSIS — I2510 Atherosclerotic heart disease of native coronary artery without angina pectoris: Secondary | ICD-10-CM | POA: Diagnosis not present

## 2020-09-01 DIAGNOSIS — C9110 Chronic lymphocytic leukemia of B-cell type not having achieved remission: Secondary | ICD-10-CM

## 2020-09-01 DIAGNOSIS — R768 Other specified abnormal immunological findings in serum: Secondary | ICD-10-CM | POA: Diagnosis not present

## 2020-09-01 DIAGNOSIS — R748 Abnormal levels of other serum enzymes: Secondary | ICD-10-CM

## 2020-09-01 DIAGNOSIS — N281 Cyst of kidney, acquired: Secondary | ICD-10-CM | POA: Diagnosis not present

## 2020-09-01 DIAGNOSIS — K8020 Calculus of gallbladder without cholecystitis without obstruction: Secondary | ICD-10-CM | POA: Diagnosis not present

## 2020-09-01 DIAGNOSIS — I700 Atherosclerosis of aorta: Secondary | ICD-10-CM | POA: Diagnosis not present

## 2020-09-01 DIAGNOSIS — K111 Hypertrophy of salivary gland: Secondary | ICD-10-CM | POA: Diagnosis not present

## 2020-09-01 DIAGNOSIS — R590 Localized enlarged lymph nodes: Secondary | ICD-10-CM | POA: Diagnosis not present

## 2020-09-01 MED ORDER — IOHEXOL 350 MG/ML SOLN
100.0000 mL | Freq: Once | INTRAVENOUS | Status: AC | PRN
  Administered 2020-09-01: 100 mL via INTRAVENOUS

## 2020-09-02 ENCOUNTER — Telehealth: Payer: Self-pay | Admitting: *Deleted

## 2020-09-02 NOTE — Telephone Encounter (Signed)
-----   Message from [name], MD sent at 09/02/2020  3:18 PM EDT ----- Please call and let her know that there is small but slightly enlarged lymph nodes throughout her chest abdomen and pelvis.  Nothing is large enough that should cause her any problems.  She should respond nicely to her treatment.  [name]

## 2020-09-10 ENCOUNTER — Other Ambulatory Visit (HOSPITAL_COMMUNITY): Payer: Self-pay

## 2020-09-10 MED ORDER — ACALABRUTINIB MALEATE 100 MG PO TABS
100.0000 mg | ORAL_TABLET | Freq: Every day | ORAL | 5 refills | Status: DC
  Filled 2020-09-10: qty 30, fill #0
  Filled 2020-09-14: qty 30, 30d supply, fill #0
  Filled 2020-10-11: qty 30, 30d supply, fill #1
  Filled 2020-11-09: qty 30, 30d supply, fill #2
  Filled 2020-12-20: qty 30, 30d supply, fill #3
  Filled 2021-01-18: qty 30, 30d supply, fill #4
  Filled 2021-02-10: qty 30, 30d supply, fill #5

## 2020-09-10 NOTE — Telephone Encounter (Signed)
Oral Chemotherapy Pharmacist Encounter   Calquence tablets are now available at [facility]. With the tablets, Ms. Lusignan will not need to stop her PPI. Will coordinate for Ms. Heacock to receive her medication next week.  Darl Pikes, PharmD, BCPS, BCOP, CPP Hematology/Oncology Clinical Pharmacist [facility] [phone]  09/10/2020 1:59 PM

## 2020-09-13 NOTE — Progress Notes (Signed)
Pharmacist Chemotherapy Monitoring - Initial Assessment    Anticipated start date: 09/21/19  The following has been reviewed per standard work regarding the patient's treatment regimen: The patient's diagnosis, treatment plan and drug doses, and organ/hematologic function Lab orders and baseline tests specific to treatment regimen  The treatment plan start date, drug sequencing, and pre-medications Prior authorization status  Patient's documented medication list, including drug-drug interaction screen and prescriptions for anti-emetics and supportive care specific to the treatment regimen The drug concentrations, fluid compatibility, administration routes, and timing of the medications to be used The patient's access for treatment and lifetime cumulative dose history, if applicable  The patient's medication allergies and previous infusion related reactions, if applicable   Changes made to treatment plan:  treatment plan date  Follow up needed:  N/A   [name], Jacqlyn Larsen, [facility], 09/13/2020  9:42 AM

## 2020-09-14 ENCOUNTER — Encounter: Payer: Self-pay | Admitting: Hematology & Oncology

## 2020-09-14 ENCOUNTER — Other Ambulatory Visit (HOSPITAL_COMMUNITY): Payer: Self-pay

## 2020-09-14 ENCOUNTER — Encounter: Payer: Self-pay | Admitting: Family

## 2020-09-14 ENCOUNTER — Other Ambulatory Visit (INDEPENDENT_AMBULATORY_CARE_PROVIDER_SITE_OTHER): Payer: Medicare Other

## 2020-09-14 ENCOUNTER — Other Ambulatory Visit: Payer: Self-pay

## 2020-09-14 DIAGNOSIS — Z794 Long term (current) use of insulin: Secondary | ICD-10-CM | POA: Diagnosis not present

## 2020-09-14 DIAGNOSIS — E1165 Type 2 diabetes mellitus with hyperglycemia: Secondary | ICD-10-CM

## 2020-09-14 LAB — HEMOGLOBIN A1C: Hgb A1c MFr Bld: 6.7 % — ABNORMAL HIGH (ref 4.6–6.5)

## 2020-09-14 LAB — BASIC METABOLIC PANEL
BUN: 24 mg/dL — ABNORMAL HIGH (ref 6–23)
CO2: 25 mEq/L (ref 19–32)
Calcium: 9.7 mg/dL (ref 8.4–10.5)
Chloride: 109 mEq/L (ref 96–112)
Creatinine, Ser: 1.54 mg/dL — ABNORMAL HIGH (ref 0.40–1.20)
GFR: 31.58 mL/min — ABNORMAL LOW (ref >60.00)
Glucose, Bld: 77 mg/dL (ref 70–99)
Potassium: 4.3 mEq/L (ref 3.5–5.1)
Sodium: 142 mEq/L (ref 135–145)

## 2020-09-14 NOTE — Telephone Encounter (Signed)
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 Planned start 09/20/20.  Pt is doing well. Counseled patient on administration, dosing, side effects, monitoring, drug-food interactions, safe handling, storage, and disposal. Patient will take 1 tablet (100 mg) by mouth daily.  *She plans on taking her acalabrutinib in the evening, because that works best with her schedule  Side effects include but not limited to: headache, diarrhea, decreased wbc/hgb/plt.   Headache: informed patient that acalabrutinib headaches respond well to caffeine  Diarrhea: she plans to pick up some loperamide to use as needed. She reported having diarrhea currently, encouraged her to start the loperamide now if needed.   Reviewed with patient importance of keeping a medication schedule and plan for any missed doses.  After discussion with patient no patient barriers to medication adherence identified.   [name] voiced understanding and appreciation. All questions answered. Medication handout provided.  Provided patient with [facility] phone number. Patient knows to call the office with questions or concerns. Oral Chemotherapy [facility] will continue to follow.  Darl Pikes, PharmD, BCPS, BCOP, CPP Hematology/Oncology Clinical Pharmacist Practitioner [facility]/HP/AP [facility] [phone]  09/14/2020 1:44 PM

## 2020-09-15 ENCOUNTER — Other Ambulatory Visit (HOSPITAL_COMMUNITY): Payer: Self-pay

## 2020-09-16 ENCOUNTER — Other Ambulatory Visit: Payer: Self-pay

## 2020-09-16 ENCOUNTER — Ambulatory Visit (INDEPENDENT_AMBULATORY_CARE_PROVIDER_SITE_OTHER): Payer: Medicare Other | Admitting: Endocrinology

## 2020-09-16 ENCOUNTER — Encounter: Payer: Self-pay | Admitting: Endocrinology

## 2020-09-16 VITALS — BP 136/84 | HR 62 | Ht 66.0 in | Wt 177.6 lb

## 2020-09-16 DIAGNOSIS — Z794 Long term (current) use of insulin: Secondary | ICD-10-CM

## 2020-09-16 DIAGNOSIS — E1165 Type 2 diabetes mellitus with hyperglycemia: Secondary | ICD-10-CM

## 2020-09-16 DIAGNOSIS — I10 Essential (primary) hypertension: Secondary | ICD-10-CM

## 2020-09-16 DIAGNOSIS — I25118 Atherosclerotic heart disease of native coronary artery with other forms of angina pectoris: Secondary | ICD-10-CM

## 2020-09-16 DIAGNOSIS — N289 Disorder of kidney and ureter, unspecified: Secondary | ICD-10-CM | POA: Diagnosis not present

## 2020-09-16 NOTE — Patient Instructions (Addendum)
Check blood sugars on waking up 3-4 days a week  Also check blood sugars about 2 hours after meals and do this after different meals by rotation  Recommended blood sugar levels on waking up are 80-130 and about 2 hours after meal is 130-180  Please bring your blood sugar monitor to each visit, thank you  Take 12 units R in am and also at supper  Take upto 16 R at supper for sweets/starchy foods

## 2020-09-16 NOTE — Progress Notes (Signed)
Patient ID: [name], female   DOB: Jan 06, 1940, 80 y.o.   MRN: [id number]            Reason for Appointment: Followup for Type 2 Diabetes   History of Present Illness:          Diagnosis: Type 2 diabetes mellitus, date of diagnosis: 1998       Past history:   She was treated with metformin at diagnosis when this had been continued until 2015 Subsequently Amaryl was also added several years ago and this has been continued Her blood sugars were under fair control between 2010 and early 2013 with A1c ranging from 7.4-9.4, mostly under 8% However since 08/2011 her A1c has been mostly over 8%  Insulin was added in 2014 with sma limited bottom ll doses of Lantus and this has been progressively increased She was started on mealtime insulin on her initial consultation in 9/15 because of high postprandial readings, sometimes over 300 With adding Victoza in 02/2014 her blood sugars were somewhat better with A1c coming down below 8%  Recent history:   INSULIN regimen: Regular 14 units before meals twice daily.  NPH 38 units in the morning and 24 hs  Non-insulin hypoglycemic drugs the patient is taking are: Victoza 0.6 mg daily.    Current blood sugar patterns, daily management and problems identified:  Her A1c is 6.7 and improved, previous range up to 8%  She was told to reduce her morning regular insulin to 12 units on the last 2 visits but she is still taking 14 units  When her blood sugars were near normal she reduced her Victoza instead of her insulin, previously was doing well with 1.2 mg  However blood sugars are still well controlled despite reducing Victoza in the last month  She is checking blood sugars only fasting or about 2 hours after dinner  Most of her high readings in the evenings are from going off her diet with foods like desserts and does not adjust her regular insulin based on what she is eating  Fasting readings are slightly variable but overall excellent with  average about 120  Today in the parking lot she felt hypoglycemic even though she did not change her diet but did not appear to be taking her morning insulin earlier than usual  No hypoglycemia overnight Usually will treat low blood sugars with regular soft drink Only able to take her insulin doses 30 minutes before eating at least in the morning Appears to have lost some weight, previously 184 in 5/22 Not doing much exercise but somewhat active with gardening      Dinner is usually at 6-7 pm Bfst 10 am  Glucose monitoring:  done 1-2 times a day         Glucometer: One Touch ultra 2.       Blood Glucose readings by time of day from download for the last 2 weeks:   PRE-MEAL Fasting Lunch Dinner Bedtime Overall  Glucose range: 77-179    77-283  Mean/median: 120/120    136   POST-MEAL PC Breakfast PC Lunch PC Dinner  Glucose range:   98-283  Mean/median:   150   Previously  PRE-MEAL Fasting Lunch Dinner Bedtime Overall  Glucose range:  93-152    75-226  Mean/median:  125    139   POST-MEAL PC Breakfast PC Lunch PC Dinner  Glucose range:    75-226  Mean/median:    150      Self-care:  The diet that the patient has been following is: none, usually eating low fat meals Meals: 2-3 meals per day          Dietician visit, most recent: never.  She saw the CDE in 02/2014               Weight history:  Wt Readings from Last 3 Encounters:  09/16/20 177 lb 9.6 oz (80.6 kg)  08/24/20 179 lb 0.6 oz (81.2 kg)  06/23/20 180 lb 1.9 oz (81.7 kg)    Glycemic control:   Lab Results  Component Value Date   HGBA1C 6.7 (H) 09/14/2020   HGBA1C 7.0 (H) 05/05/2020   HGBA1C 6.9 (H) 11/18/2019   Lab Results  Component Value Date   MICROALBUR 7.5 (H) 11/18/2019   LDLCALC 41 05/05/2020   CREATININE 1.54 (H) 09/14/2020     OTHER active problems  discussed in review of systems   Allergies as of 09/16/2020       Reactions   Tramadol    Insomnia    Codeine    REACTION: makes her  nervous, orTylenol #3   Ibuprofen    REACTION: nervous   Meperidine Hcl    REACTION: nasuea and vomitting   Naproxen Sodium    REACTION: nervous        Medication List        Accurate as of September 16, 2020 11:10 AM. If you have any questions, ask your nurse or doctor.          amLODipine 5 MG tablet Commonly known as: NORVASC TAKE 1 TABLET(5 MG) BY MOUTH DAILY   aspirin 81 MG chewable tablet Chew 1 tablet (81 mg total) by mouth 2 (two) times daily with a meal. What changed: when to take this   atenolol 100 MG tablet Commonly known as: TENORMIN TAKE 1 TABLET(100 MG) BY MOUTH DAILY   atorvastatin 20 MG tablet Commonly known as: LIPITOR TAKE 1 TABLET(20 MG) BY MOUTH DAILY   azelastine 0.1 % nasal spray Commonly known as: ASTELIN Place 2 sprays into both nostrils at bedtime as needed for rhinitis or allergies. Use in each nostril as directed   Calquence 100 MG Tabs Generic drug: Acalabrutinib Maleate Take 100 mg by mouth daily.   cholecalciferol 1000 units tablet Commonly known as: VITAMIN D Take 1,000 Units by mouth daily.   clopidogrel 75 MG tablet Commonly known as: PLAVIX Take 1 tablet (75 mg total) by mouth daily.   clorazepate 7.5 MG tablet Commonly known as: TRANXENE Take 1 tablet (7.5 mg total) by mouth as needed.   fluticasone 50 MCG/ACT nasal spray Commonly known as: FLONASE SHAKE LIQUID AND USE 2 SPRAYS IN EACH NOSTRIL DAILY   insulin NPH Human 100 UNIT/ML injection Commonly known as: NOVOLIN N Inject 38 units into the skin every morning and inject 24 units into the skin at bedtime.   insulin regular 100 units/mL injection Commonly known as: NOVOLIN R Inject 14 Units into the skin 2 (two) times daily before a meal.   INSULIN SYRINGE .5CC/31GX5/16" 31G X 5/16" 0.5 ML Misc USE TO INJECT INSULIN 5 TIMES PER DAY   irbesartan 300 MG tablet Commonly known as: Avapro Take 0.5 tablets (150 mg total) by mouth daily.   isosorbide mononitrate  30 MG 24 hr tablet Commonly known as: IMDUR TAKE 1 TABLET BY MOUTH EVERY DAY   metFORMIN 500 MG tablet Commonly known as: GLUCOPHAGE TAKE 1 TABLET(500 MG) BY MOUTH DAILY WITH SUPPER   mometasone 0.1 %  cream Commonly known as: ELOCON Apply 1 application topically daily. Use as directed   multivitamin capsule Take 1 capsule by mouth daily.   nitroGLYCERIN 0.3 MG SL tablet Commonly known as: Nitrostat Place 1 tablet (0.3 mg total) under the tongue every 5 (five) minutes as needed for chest pain (ER if no better after 3 tablets).   omeprazole 20 MG capsule Commonly known as: PRILOSEC TAKE 1 CAPSULE(20 MG) BY MOUTH DAILY   ONE TOUCH ULTRA SYSTEM KIT w/Device Kit 1 kit by Does not apply route once.   OneTouch Ultra test strip Generic drug: glucose blood USE TO TEST BLOOD SUGAR 3 TIMES DAILY Dx code E11.65   Victoza 18 MG/3ML Sopn Generic drug: liraglutide ADMINISTER 1.2 MG UNDER THE SKIN DAILY        Allergies:  Allergies  Allergen Reactions   Tramadol     Insomnia    Codeine     REACTION: makes her nervous, orTylenol #3   Ibuprofen     REACTION: nervous   Meperidine Hcl     REACTION: nasuea and vomitting   Naproxen Sodium     REACTION: nervous    Past Medical History:  Diagnosis Date   Acute blood loss anemia 09/17/2015   Allergy    Anemia    Anginal pain ([place]) 2017   Anxiety    Arthritis    "hands and knees mostly" (09/16/2015)   B12 deficiency anemia    CAD (coronary artery disease)    Dr Gwenlyn Found   Cataract    bil cataracts removed   CLL (chronic lymphocytic leukemia) ([place]) 02/25/2014   Clotting disorder ([place])    Depression    DJD (degenerative joint disease)    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   Gastritis    GERD (gastroesophageal reflux disease)    Headache(784.0)    History of blood transfusion    "I"ve had 20 some; thru birth of children, loss of blood, last 2 were in ~ 1999 before my cancer surgery" (09/16/2015)   History of  hiatal hernia    History of shingles    Hx of colonic polyps    Hyperlipidemia    Hypertension    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)    as a child   Venous insufficiency     Past Surgical History:  Procedure Laterality Date   [place]; 2008; 09/16/2015   CARDIAC CATHETERIZATION N/A 09/16/2015   Procedure: Right/Left Heart Cath and Coronary/Graft Angiography;  Surgeon: Lorretta Harp, MD;  Location: [facility];  Service: Cardiovascular;  Laterality: N/A;   CESAREAN SECTION  1966   COLONOSCOPY     CORONARY ARTERY BYPASS GRAFT  1997   CABG X5   EYE SURGERY     2 laser surgeries on left eye with implant   EYE SURGERY Bilateral    cataracts   FEMUR IM NAIL Left 08/28/2018   Procedure: INTRAMEDULLARY (IM) NAIL FEMORAL;  Surgeon: Rod Can, MD;  Location: [facility];  Service: Orthopedics;  Laterality: Left;   FRACTURE SURGERY     HERNIA REPAIR     LAPAROSCOPIC ASSISTED VENTRAL HERNIA REPAIR  09/2008  with incarcerated colon;  Dr. Lucia Gaskins   MOHS SURGERY  06/2016   [place] Left 11/1994   "crushed my knee"; put in a plate & 6 screws"   POLYPECTOMY     REFRACTIVE SURGERY Left 07/30/2015   resection of small bowel carcinoma  06/1998   Dr. March Rummage   SHOULDER ARTHROSCOPY W/ ROTATOR CUFF REPAIR Right [place] Left 12/1994    (infex, hardware removed )   TUBAL LIGATION  1966    Family History  Problem Relation Age of Onset   Heart disease Mother 38   Heart disease Father 97       MI   Hypertension Child    Heart  attack Child    Diabetes Child    Heart disease Maternal Aunt        x 2, all deceased   Heart disease Maternal Uncle        x 4, all deceased   Emphysema Brother 35   Colon cancer Neg Hx    Breast cancer Neg Hx    Rectal cancer Neg Hx    Stomach cancer Neg Hx     Social History:  reports that she has never smoked. She has never used smokeless tobacco. She reports that she does not drink alcohol and does not use drugs.    Review of Systems         Lipids: Has had excellent control of hypercholesterolemia with long-term use of Lipitor She has a history of coronary bypass surgery.  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  She does have a high microalbumin level in the past Normal in 11/21    Lab Results  Component Value Date   CREATININE 1.54 (H) 09/14/2020   CREATININE 1.43 (H) 08/24/2020   CREATININE 1.42 (H) 06/23/2020       No history of Numbness, tingling or burning in feet   Diabetic foot exam was in 5/19 showing mild neuropathy  History of CLL: Under the care of hematologist every 6 months, now is going to be scheduled for chemotherapy  Lab Results  Component Value Date   WBC 108.0 (HH) 08/24/2020   Lab Results  Component Value Date   HGB 10.5 (L) 08/24/2020       Physical Examination:  BP 136/84   Pulse 62   Ht _0  (1.676 m)   Wt 177 lb 9.6 oz (80.6 kg)   SpO2 98%   BMI 28.67 kg/m    No significant lower leg edema present  Diabetes type 2, insulin requiring     See history of present illness for detailed discussion of her current blood sugar patterns, management and problems identified  Her A1c is improved at 6.7  She has been on regular and NPH insulin twice a day along with metformin and Victoza  She has reduced her Victoza when her blood sugars seem to be relatively lower  May have some tendency to weight loss possibly from reducing portions and being active this summer  However discussed that she should preferably reduce her insulin and not Victoza  She continues to forget to change her insulin doses as instructed on the prior visits  However today she did get hypoglycemia symptomatically without monitoring her sugar probably because she took her morning insulin doses about 3 hours earlier than usual Her diet is consistent in the morning but variable in the evening causing readings as high as 283   RENAL insufficiency: Her creatinine is variable but not considerably worse Currently not on diuretics Likely from nephrosclerosis  HYPERTENSION: Blood pressure is well controlled  She will continue to follow-up with her PCP regarding this    PLAN:  Discussed need to avoid low blood sugars and since she is taking regular insulin she may potentially get low sugars overnight  She will reduce her regular insulin to 12 units twice daily  However she can increase her evening dose if she is going off her diet or wants to eat larger portions or more carbohydrate/sweets NPH will be continued unchanged  Since her blood sugars do not appear to be increasing with reducing the dose as she can continue the smaller dose  Discussed appropriate treatment of low sugars  Given written instructions to not check blood sugars every morning but alternate with after meal readings in the morning too  She will let us know if she is going to get any steroids with her chemotherapy as she may need temporarily increased dose of insulin by 6 to 8 units Continue to take  500 mg metformin at dinnertime  Reminded her to get her flu vaccine next month  Total visit time including counseling = 30 minutes  Patient Instructions  Check blood sugars on waking up days a week  Also check blood sugars about 2 hours after meals and do this after different meals by rotation  Recommended blood sugar levels on waking up are 80-130 and about 2 hours after meal is 130-180  Please bring your blood sugar monitor to each visit, thank you  Take 12 units R in am   Elayne Snare 09/16/2020, 11:10 AM   Note: This office note was prepared with Dragon voice recognition system technology. Any transcriptional errors that result from this process are unintentional.

## 2020-09-20 ENCOUNTER — Other Ambulatory Visit: Payer: Self-pay | Admitting: Hematology & Oncology

## 2020-09-20 ENCOUNTER — Other Ambulatory Visit: Payer: Self-pay

## 2020-09-20 ENCOUNTER — Inpatient Hospital Stay: Payer: Medicare Other

## 2020-09-20 ENCOUNTER — Inpatient Hospital Stay (HOSPITAL_BASED_OUTPATIENT_CLINIC_OR_DEPARTMENT_OTHER): Payer: Medicare Other | Admitting: Hematology & Oncology

## 2020-09-20 ENCOUNTER — Encounter: Payer: Self-pay | Admitting: Hematology & Oncology

## 2020-09-20 ENCOUNTER — Telehealth: Payer: Self-pay

## 2020-09-20 ENCOUNTER — Inpatient Hospital Stay: Payer: Medicare Other | Attending: Hematology & Oncology

## 2020-09-20 VITALS — BP 122/50 | HR 79 | Temp 98.5°F | Resp 17

## 2020-09-20 VITALS — BP 154/61 | HR 71 | Temp 98.0°F | Resp 20 | Wt 182.0 lb

## 2020-09-20 DIAGNOSIS — Z794 Long term (current) use of insulin: Secondary | ICD-10-CM | POA: Diagnosis not present

## 2020-09-20 DIAGNOSIS — Z79899 Other long term (current) drug therapy: Secondary | ICD-10-CM | POA: Diagnosis not present

## 2020-09-20 DIAGNOSIS — D509 Iron deficiency anemia, unspecified: Secondary | ICD-10-CM | POA: Diagnosis not present

## 2020-09-20 DIAGNOSIS — R748 Abnormal levels of other serum enzymes: Secondary | ICD-10-CM

## 2020-09-20 DIAGNOSIS — C9110 Chronic lymphocytic leukemia of B-cell type not having achieved remission: Secondary | ICD-10-CM | POA: Insufficient documentation

## 2020-09-20 DIAGNOSIS — Z5112 Encounter for antineoplastic immunotherapy: Secondary | ICD-10-CM | POA: Diagnosis not present

## 2020-09-20 DIAGNOSIS — Z85038 Personal history of other malignant neoplasm of large intestine: Secondary | ICD-10-CM | POA: Diagnosis not present

## 2020-09-20 DIAGNOSIS — R768 Other specified abnormal immunological findings in serum: Secondary | ICD-10-CM

## 2020-09-20 LAB — CMP (CANCER CENTER ONLY)
ALT: 14 U/L (ref 0–44)
AST: 19 U/L (ref 15–41)
Albumin: 4.1 g/dL (ref 3.5–5.0)
Alkaline Phosphatase: 92 U/L (ref 38–126)
Anion gap: 7 (ref 5–15)
BUN: 17 mg/dL (ref 8–23)
CO2: 25 mmol/L (ref 22–32)
Calcium: 8.4 mg/dL — ABNORMAL LOW (ref 8.9–10.3)
Chloride: 114 mmol/L — ABNORMAL HIGH (ref 98–111)
Creatinine: 1.33 mg/dL — ABNORMAL HIGH (ref 0.44–1.00)
GFR, Estimated: 40 mL/min — ABNORMAL LOW (ref >60)
Glucose, Bld: 51 mg/dL — ABNORMAL LOW (ref 70–99)
Potassium: 4.2 mmol/L (ref 3.5–5.1)
Sodium: 146 mmol/L — ABNORMAL HIGH (ref 135–145)
Total Bilirubin: 0.4 mg/dL (ref 0.3–1.2)
Total Protein: 6.6 g/dL (ref 6.5–8.1)

## 2020-09-20 LAB — CBC WITH DIFFERENTIAL (CANCER CENTER ONLY)
Abs Immature Granulocytes: 0.22 10*3/uL — ABNORMAL HIGH (ref 0.00–0.07)
Basophils Absolute: 0.3 10*3/uL — ABNORMAL HIGH (ref 0.0–0.1)
Basophils Relative: 0 %
Eosinophils Absolute: 0.3 10*3/uL (ref 0.0–0.5)
Eosinophils Relative: 0 %
HCT: 33.9 % — ABNORMAL LOW (ref 36.0–46.0)
Hemoglobin: 10.4 g/dL — ABNORMAL LOW (ref 12.0–15.0)
Immature Granulocytes: 0 %
Lymphocytes Relative: 86 %
Lymphs Abs: 95.3 10*3/uL — ABNORMAL HIGH (ref 0.7–4.0)
MCH: 30.1 pg (ref 26.0–34.0)
MCHC: 30.7 g/dL (ref 30.0–36.0)
MCV: 98.3 fL (ref 80.0–100.0)
Monocytes Absolute: 9.7 10*3/uL — ABNORMAL HIGH (ref 0.1–1.0)
Monocytes Relative: 9 %
Neutro Abs: 5.5 10*3/uL (ref 1.7–7.7)
Neutrophils Relative %: 5 %
Platelet Count: 180 10*3/uL (ref 150–400)
RBC: 3.45 MIL/uL — ABNORMAL LOW (ref 3.87–5.11)
RDW: 15.2 % (ref 11.5–15.5)
WBC Count: 111.3 10*3/uL (ref 4.0–10.5)
nRBC: 0 % (ref 0.0–0.2)

## 2020-09-20 LAB — HEPATITIS B CORE ANTIBODY, TOTAL: Hep B Core Total Ab: NONREACTIVE

## 2020-09-20 LAB — HEPATITIS B SURFACE ANTIGEN: Hepatitis B Surface Ag: NONREACTIVE

## 2020-09-20 MED ORDER — PROCHLORPERAZINE MALEATE 10 MG PO TABS: 10.0000 mg | ORAL_TABLET | Freq: Four times a day (QID) | ORAL | 3 refills | Status: DC | PRN

## 2020-09-20 MED ORDER — SODIUM CHLORIDE 0.9 % IV SOLN: Freq: Once | INTRAVENOUS | Status: AC

## 2020-09-20 MED ORDER — DIPHENHYDRAMINE HCL 25 MG PO CAPS
50.0000 mg | ORAL_CAPSULE | Freq: Once | ORAL | Status: AC
  Administered 2020-09-20: 50 mg via ORAL
  Filled 2020-09-20: qty 2

## 2020-09-20 MED ORDER — DIPHENHYDRAMINE HCL 50 MG/ML IJ SOLN: 50.0000 mg | Freq: Once | INTRAMUSCULAR | Status: DC | PRN

## 2020-09-20 MED ORDER — SODIUM CHLORIDE 0.9 % IV SOLN: Freq: Once | INTRAVENOUS | Status: DC | PRN

## 2020-09-20 MED ORDER — ACETAMINOPHEN 325 MG PO TABS
650.0000 mg | ORAL_TABLET | Freq: Once | ORAL | Status: AC
  Administered 2020-09-20: 650 mg via ORAL
  Filled 2020-09-20: qty 2

## 2020-09-20 MED ORDER — METHYLPREDNISOLONE SODIUM SUCC 125 MG IJ SOLR
125.0000 mg | Freq: Once | INTRAMUSCULAR | Status: AC | PRN
  Administered 2020-09-20: 125 mg via INTRAVENOUS

## 2020-09-20 MED ORDER — FAMOTIDINE IN NACL 20-0.9 MG/50ML-% IV SOLN
20.0000 mg | Freq: Once | INTRAVENOUS | Status: AC | PRN
  Administered 2020-09-20: 20 mg via INTRAVENOUS

## 2020-09-20 MED ORDER — ALLOPURINOL 100 MG PO TABS: 100.0000 mg | ORAL_TABLET | Freq: Every day | ORAL | 0 refills | Status: DC

## 2020-09-20 MED ORDER — LORAZEPAM 2 MG/ML IJ SOLN
1.0000 mg | Freq: Once | INTRAMUSCULAR | Status: AC
  Administered 2020-09-20: 1 mg via INTRAVENOUS

## 2020-09-20 MED ORDER — EPINEPHRINE 0.3 MG/0.3ML IJ SOAJ
0.3000 mg | Freq: Once | INTRAMUSCULAR | Status: DC | PRN
  Filled 2020-09-20: qty 0.6

## 2020-09-20 MED ORDER — LORAZEPAM 2 MG/ML IJ SOLN
INTRAMUSCULAR | Status: AC
  Filled 2020-09-20: qty 1

## 2020-09-20 MED ORDER — MEPERIDINE HCL 25 MG/ML IJ SOLN
12.5000 mg | Freq: Once | INTRAMUSCULAR | Status: AC
  Administered 2020-09-20: 12.5 mg via INTRAVENOUS
  Filled 2020-09-20: qty 1

## 2020-09-20 MED ORDER — ALBUTEROL SULFATE (2.5 MG/3ML) 0.083% IN NEBU: 2.5000 mg | INHALATION_SOLUTION | Freq: Once | RESPIRATORY_TRACT | Status: DC | PRN

## 2020-09-20 MED ORDER — SODIUM CHLORIDE 0.9 % IV SOLN
375.0000 mg/m2 | Freq: Once | INTRAVENOUS | Status: AC
  Administered 2020-09-20: 700 mg via INTRAVENOUS
  Filled 2020-09-20: qty 50

## 2020-09-20 NOTE — Telephone Encounter (Signed)
Appts made per 09/20/20 los, pt to gain updated sch at [employer]

## 2020-09-20 NOTE — Progress Notes (Deleted)
Lab brought to my attention a panic WBC of 111.38. Results given to MD.

## 2020-09-20 NOTE — Progress Notes (Signed)
Hematology and Oncology Follow Up Visit  Ray [id number] 1940/11/01 80 y.o. 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 Bostrom is here today for follow-up.  We will start the Rituxan today.  She will start the acalabrutinib today.  We did do scans on her.  Not surprising, scans does show lymphadenopathy throughout the body.  She does not have any bulky lymph nodes which is nice thing.  I do think that we had to put her on a little allopurinol.  Her white cell count today is 111,000.  I think allopurinol would not be a bad idea to help prevent the tumor lysis.  She has had no problems with bowels or bladder.  She has had no cough or shortness of breath.  She has had no rashes.  She had a nice weekend.  She was down the [place] part [place] the [place].  She enjoyed herself.  Currently, I would say performance status is ECOG 1.    Medications:  Allergies as of 09/20/2020       Reactions   Tramadol    Insomnia    Codeine    REACTION: makes her nervous, orTylenol #3   Ibuprofen    REACTION: nervous   Meperidine Hcl    REACTION: nasuea and vomitting   Naproxen Sodium    REACTION: nervous        Medication List        Accurate as of September 20, 2020 10:26 AM. If you have any questions, ask your nurse or doctor.          allopurinol 100 MG tablet Commonly known as: ZYLOPRIM Take 1 tablet (100 mg total) by mouth daily. Started by: [name], MD   amLODipine 5 MG tablet Commonly known as: NORVASC TAKE 1 TABLET(5 MG) BY MOUTH DAILY   aspirin 81 MG chewable tablet Chew 1 tablet (81 mg total) by mouth 2 (two) times daily with a meal. What changed: when to take this   atenolol 100 MG tablet Commonly known as:  TENORMIN TAKE 1 TABLET(100 MG) BY MOUTH DAILY   atorvastatin 20 MG tablet Commonly known as: LIPITOR TAKE 1 TABLET(20 MG) BY MOUTH DAILY   azelastine 0.1 % nasal spray Commonly known as: ASTELIN Place 2 sprays into both nostrils at bedtime as needed for rhinitis or allergies. Use in each nostril as directed   Calquence 100 MG Tabs Generic drug: Acalabrutinib Maleate Take 100 mg by mouth daily.   cholecalciferol 1000 units tablet Commonly known as: VITAMIN D Take 1,000 Units by mouth daily.   clopidogrel 75 MG tablet Commonly known as: PLAVIX Take 1 tablet (75 mg total) by mouth daily.   clorazepate 7.5 MG tablet Commonly known as: TRANXENE Take 1 tablet (7.5 mg total) by mouth as needed.   fluticasone 50 MCG/ACT nasal spray Commonly known as: FLONASE SHAKE LIQUID AND USE 2 SPRAYS IN EACH NOSTRIL DAILY What changed: See the new instructions.   insulin NPH Human 100 UNIT/ML injection Commonly known as: NOVOLIN N Inject 38 units into the skin every morning and inject 24 units into the skin at bedtime.   insulin regular 100 units/mL injection Commonly known as: NOVOLIN R Inject 12 Units into the skin 2 (two) times daily before a meal.  INSULIN SYRINGE .5CC/31GX5/16" 31G X 5/16" 0.5 ML Misc USE TO INJECT INSULIN 5 TIMES PER DAY   irbesartan 300 MG tablet Commonly known as: Avapro Take 0.5 tablets (150 mg total) by mouth daily.   isosorbide mononitrate 30 MG 24 hr tablet Commonly known as: IMDUR TAKE 1 TABLET BY MOUTH EVERY DAY   metFORMIN 500 MG tablet Commonly known as: GLUCOPHAGE TAKE 1 TABLET(500 MG) BY MOUTH DAILY WITH SUPPER   mometasone 0.1 % cream Commonly known as: ELOCON Apply 1 application topically daily. Use as directed   multivitamin capsule Take 1 capsule by mouth daily.   nitroGLYCERIN 0.3 MG SL tablet Commonly known as: Nitrostat Place 1 tablet (0.3 mg total) under the tongue every 5 (five) minutes as needed for chest pain (ER if no better  after 3 tablets).   omeprazole 20 MG capsule Commonly known as: PRILOSEC TAKE 1 CAPSULE(20 MG) BY MOUTH DAILY   ONE TOUCH ULTRA SYSTEM KIT w/Device Kit 1 kit by Does not apply route once.   OneTouch Ultra test strip Generic drug: glucose blood USE TO TEST BLOOD SUGAR 3 TIMES DAILY Dx code E11.65   Victoza 18 MG/3ML Sopn Generic drug: liraglutide ADMINISTER 1.2 MG UNDER THE SKIN DAILY What changed: See the new instructions.        Allergies:  Allergies  Allergen Reactions   Tramadol     Insomnia    Codeine     REACTION: makes her nervous, orTylenol #3   Ibuprofen     REACTION: nervous   Meperidine Hcl     REACTION: nasuea and vomitting   Naproxen Sodium     REACTION: nervous    Past Medical History, Surgical history, Social history, and Family History were reviewed and updated.  Review of Systems: Review of Systems  Constitutional: Negative.   HENT: Negative.    Eyes: Negative.   Respiratory: Negative.    Cardiovascular: Negative.   Gastrointestinal: Negative.   Genitourinary: Negative.   Musculoskeletal: Negative.   Skin: Negative.   Neurological: Negative.   Endo/Heme/Allergies: Negative.   Psychiatric/Behavioral: Negative.      Physical Exam:  weight is 182 lb (82.6 kg). Her oral temperature is 98 F (36.7 C). Her blood pressure is 154/61 (abnormal) and her pulse is 71. Her respiration is 20 and oxygen saturation is 100%.   Wt Readings from Last 3 Encounters:  09/20/20 182 lb (82.6 kg)  09/16/20 177 lb 9.6 oz (80.6 kg)  08/24/20 179 lb 0.6 oz (81.2 kg)    Physical Exam Vitals reviewed.  HENT:     Head: Normocephalic and atraumatic.  Eyes:     Pupils: Pupils are equal, round, and reactive to light.  Cardiovascular:     Rate and Rhythm: Normal rate and regular rhythm.     Heart sounds: Normal heart sounds.  Pulmonary:     Effort: Pulmonary effort is normal.     Breath sounds: Normal breath sounds.  Abdominal:     General: Bowel sounds are  normal.     Palpations: Abdomen is soft.  Musculoskeletal:        General: No tenderness or deformity. Normal range of motion.     Cervical back: Normal range of motion.  Lymphadenopathy:     Cervical: No cervical adenopathy.  Skin:    General: Skin is warm and dry.     Findings: No erythema or rash.  Neurological:     Mental Status: She is alert and oriented to person, place, and time.  Psychiatric:        Behavior: Behavior normal.        Thought Content: Thought content normal.        Judgment: Judgment normal.     Lab Results  Component Value Date   WBC 111.3 (HH) 09/20/2020   HGB 10.4 (L) 09/20/2020   HCT 33.9 (L) 09/20/2020   MCV 98.3 09/20/2020   PLT 180 09/20/2020   Lab Results  Component Value Date   FERRITIN 90 08/24/2020   IRON 81 08/24/2020   TIBC 310 08/24/2020   UIBC 229 08/24/2020   IRONPCTSAT 26 08/24/2020   Lab Results  Component Value Date   RETICCTPCT 1.9 08/24/2020   RBC 3.45 (L) 09/20/2020   Lab Results  Component Value Date   KPAFRELGTCHN 56.5 (H) 06/23/2020   LAMBDASER 28.9 (H) 06/23/2020   KAPLAMBRATIO 1.96 (H) 06/23/2020   Lab Results  Component Value Date   IGGSERUM 802 06/23/2020   IGA 154 06/23/2020   IGMSERUM 19 (L) 06/23/2020   Lab Results  Component Value Date   TOTALPROTELP 6.2 06/23/2020   ALBUMINELP 3.6 06/23/2020   A1GS 0.2 06/23/2020   A2GS 0.9 06/23/2020   BETS 0.9 06/23/2020   BETA2SER 6.2 02/25/2014   GAMS 0.7 06/23/2020   MSPIKE Not Observed 06/23/2020   SPEI * 02/25/2014     Chemistry      Component Value Date/Time   NA 146 (H) 09/20/2020 0848   NA 147 (H) 11/03/2016 1018   NA 143 08/30/2016 0954   K 4.2 09/20/2020 0848   K 3.9 11/03/2016 1018   K 4.5 08/30/2016 0954   CL 114 (H) 09/20/2020 0848   CL 110 (H) 11/03/2016 1018   CO2 25 09/20/2020 0848   CO2 27 11/03/2016 1018   CO2 24 08/30/2016 0954   BUN 17 09/20/2020 0848   BUN 25 (H) 11/03/2016 1018   BUN 20.5 08/30/2016 0954   CREATININE 1.33  (H) 09/20/2020 0848   CREATININE 1.6 (H) 11/03/2016 1018   CREATININE 1.4 (H) 08/30/2016 0954      Component Value Date/Time   CALCIUM 8.4 (L) 09/20/2020 0848   CALCIUM 10.1 11/03/2016 1018   CALCIUM 9.8 08/30/2016 0954   ALKPHOS 92 09/20/2020 0848   ALKPHOS 112 (H) 11/03/2016 1018   ALKPHOS 113 08/30/2016 0954   AST 19 09/20/2020 0848   AST 23 08/30/2016 0954   ALT 14 09/20/2020 0848   ALT 29 11/03/2016 1018   ALT 20 08/30/2016 0954   BILITOT 0.4 09/20/2020 0848   BILITOT 0.38 08/30/2016 0954      Impression and Plan: Ms. Jewell is a very pleasant 80 yo caucasian female with CLL stage A.  So far, we have not had any negative prognostic markers.  She does have the 13q-chromosomal abnormality.  This is typically construed as a good prognostic marker.  We will go ahead with the Rituxan.  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  I have to believe that we can get her into remission with the Rituxan/acalabrutinib combination.  Again, she is on acalabrutinib once a day instead of twice a day.  Because of her maturity, I think once a day would be a reasonable way to start.  If we do not see a good response, then we can try twice a day dosing.  I will plan to get her back in a month.  Again I would like to believe that her  white cell count hopefully will be down by at least 50%.   [name], MD 9/19/202210:26 AM

## 2020-09-20 NOTE — Progress Notes (Signed)
1228 pt having rigors, lower back pain and chills. Infusion stopped. NS wide open to gravity.  Pt given emergency medications for reaction. MD called to chairside to [id number]  3  Pt resting with eyes closed VSS.    Per MD rechallenge pt . Beginning at last rate of 46/hr  with 22vol

## 2020-09-20 NOTE — Patient Instructions (Signed)
[facility]  Discharge Instructions: Thank you for choosing [facility] to provide your oncology and hematology care.   If you have a lab appointment with the [facility], please go directly to the [facility] and check in at the registration area.  Wear comfortable clothing and clothing appropriate for easy access to any Portacath or PICC line.   We strive to give you quality time with your provider. You may need to reschedule your appointment if you arrive late (15 or more minutes).  Arriving late affects you and other patients whose appointments are after yours.  Also, if you miss three or more appointments without notifying the office, you may be dismissed from the clinic at the provider's discretion.      For prescription refill requests, have your pharmacy contact our office and allow 72 hours for refills to be completed.    Today you received the following chemotherapy and/or immunotherapy agents Rituxan      To help prevent nausea and vomiting after your treatment, we encourage you to take your nausea medication as directed.  BELOW ARE SYMPTOMS THAT SHOULD BE REPORTED IMMEDIATELY: *FEVER GREATER THAN 100.4 F (38 C) OR HIGHER *CHILLS OR SWEATING *NAUSEA AND VOMITING THAT IS NOT CONTROLLED WITH YOUR NAUSEA MEDICATION *UNUSUAL SHORTNESS OF BREATH *UNUSUAL BRUISING OR BLEEDING *URINARY PROBLEMS (pain or burning when urinating, or frequent urination) *BOWEL PROBLEMS (unusual diarrhea, constipation, pain near the anus) TENDERNESS IN MOUTH AND THROAT WITH OR WITHOUT PRESENCE OF ULCERS (sore throat, sores in mouth, or a toothache) UNUSUAL RASH, SWELLING OR PAIN  UNUSUAL VAGINAL DISCHARGE OR ITCHING   Items with * indicate a potential emergency and should be followed up as soon as possible or go to the Emergency Department if any problems should occur.  Please show the CHEMOTHERAPY ALERT CARD or IMMUNOTHERAPY ALERT CARD at check-in to the  Emergency Department and triage nurse. Should you have questions after your visit or need to cancel or reschedule your appointment, please contact [facility]  [phone] and follow the prompts.  Office hours are 8:00 a.m. to 4:30 p.m. Monday - Friday. Please note that voicemails left after 4:00 p.m. may not be returned until the following business day.  We are closed weekends and major holidays. You have access to a nurse at all times for urgent questions. Please call the main number to the clinic [phone] and follow the prompts.  For any non-urgent questions, you may also contact your provider using MyChart. We now offer e-Visits for anyone 88 and older to request care online for non-urgent symptoms. For details visit [email or web address].   Also download the MyChart app! Go to the app store, search "MyChart", open the app, select [facility] , and log in with your MyChart username and password.  Due to Covid, a mask is required upon entering the hospital/clinic. If you do not have a mask, one will be given to you upon arrival. For doctor visits, patients may have 1 support person aged 25 or older with them. For treatment visits, patients cannot have anyone with them due to current Covid guidelines and our immunocompromised population.   Rituximab Injection What is this medication? RITUXIMAB (ri TUX i mab) is a monoclonal antibody. It is used to treat certain types of cancer like non-Hodgkin lymphoma and chronic lymphocytic leukemia. It is also used to treat rheumatoid arthritis, granulomatosis with polyangiitis, microscopic polyangiitis, and pemphigus vulgaris. This medicine may be used for  other purposes; ask your health care provider or pharmacist if you have questions. COMMON BRAND NAME(S): RIABNI, Rituxan, RUXIENCE What should I tell my care team before I take this medication? They need to know if you have any of these conditions: chest pain heart  disease infection especially a viral infection such as chickenpox, cold sores, hepatitis B, or herpes immune system problems irregular heartbeat or rhythm kidney disease low blood counts (white cells, platelets, or red cells) lung disease recent or upcoming vaccine an unusual or allergic reaction to rituximab, other medicines, foods, dyes, or preservatives pregnant or trying to get pregnant breast-feeding How should I use this medication? This medicine is injected into a vein. It is given by a health care provider in a hospital or clinic setting. A special MedGuide will be given to you before each treatment. Be sure to read this information carefully each time. Talk to your health care provider about the use of this medicine in children. While this drug may be prescribed for children as young as 6 months for selected conditions, precautions do apply. Overdosage: If you think you have taken too much of this medicine contact a poison control center or emergency room at once. NOTE: This medicine is only for you. Do not share this medicine with others. What if I miss a dose? Keep appointments for follow-up doses. It is important not to miss your dose. Call your health care provider if you are unable to keep an appointment. What may interact with this medication? Do not take this medicine with any of the following medicines: live vaccines This medicine may also interact with the following medicines: cisplatin This list may not describe all possible interactions. Give your health care provider a list of all the medicines, herbs, non-prescription drugs, or dietary supplements you use. Also tell them if you smoke, drink alcohol, or use illegal drugs. Some items may interact with your medicine. What should I watch for while using this medication? Your condition will be monitored carefully while you are receiving this medicine. You may need blood work done while you are taking this medicine. This  medicine can cause serious infusion reactions. To reduce the risk your health care provider may give you other medicines to take before receiving this one. Be sure to follow the directions from your health care provider. This medicine may increase your risk of getting an infection. Call your health care provider for advice if you get a fever, chills, sore throat, or other symptoms of a cold or flu. Do not treat yourself. Try to avoid being around people who are sick. Call your health care provider if you are around anyone with measles, chickenpox, or if you develop sores or blisters that do not heal properly. Avoid taking medicines that contain aspirin, acetaminophen, ibuprofen, naproxen, or ketoprofen unless instructed by your health care provider. These medicines may hide a fever. This medicine may cause serious skin reactions. They can happen weeks to months after starting the medicine. Contact your health care provider right away if you notice fevers or flu-like symptoms with a rash. The rash may be red or purple and then turn into blisters or peeling of the skin. Or, you might notice a red rash with swelling of the face, lips or lymph nodes in your neck or under your arms. In some patients, this medicine may cause a serious brain infection that may cause death. If you have any problems seeing, thinking, speaking, walking, or standing, tell your healthcare professional right away.  If you cannot reach your healthcare professional, urgently seek other source of medical care. Do not become pregnant while taking this medicine or for at least 12 months after stopping it. Women should inform their health care provider if they wish to become pregnant or think they might be pregnant. There is potential for serious harm to an unborn child. Talk to your health care provider for more information. Women should use a reliable form of birth control while taking this medicine and for 12 months after stopping it. Do not  breast-feed while taking this medicine or for at least 6 months after stopping it. What side effects may I notice from receiving this medication? Side effects that you should report to your health care provider as soon as possible: allergic reactions (skin rash, itching or hives; swelling of the face, lips, or tongue) diarrhea edema (sudden weight gain; swelling of the ankles, feet, hands or other unusual swelling; trouble breathing) fast, irregular heartbeat heart attack (trouble breathing; pain or tightness in the chest, neck, back or arms; unusually weak or tired) infection (fever, chills, cough, sore throat, pain or trouble passing urine) kidney injury (trouble passing urine or change in the amount of urine) liver injury (dark yellow or brown urine; general ill feeling or flu-like symptoms; loss of appetite, right upper belly pain; unusually weak or tired, yellowing of the eyes or skin) low blood pressure (dizziness; feeling faint or lightheaded, falls; unusually weak or tired) low red blood cell counts (trouble breathing; feeling faint; lightheaded, falls; unusually weak or tired) mouth sores redness, blistering, peeling, or loosening of the skin, including inside the mouth stomach pain unusual bruising or bleeding wheezing (trouble breathing with loud or whistling sounds) vomiting Side effects that usually do not require medical attention (report to your health care provider if they continue or are bothersome): headache joint pain muscle cramps, pain nausea This list may not describe all possible side effects. Call your doctor for medical advice about side effects. You may report side effects to FDA at 1-800-FDA-1088. Where should I keep my medication? This medicine is given in a hospital or clinic. It will not be stored at home. NOTE: This sheet is a summary. It may not cover all possible information. If you have questions about this medicine, talk to your doctor, pharmacist, or  health care provider.  2022 Elsevier/Gold Standard (2019-12-11 15:47:26)

## 2020-10-11 ENCOUNTER — Other Ambulatory Visit (HOSPITAL_COMMUNITY): Payer: Self-pay

## 2020-10-14 ENCOUNTER — Other Ambulatory Visit (HOSPITAL_COMMUNITY): Payer: Self-pay

## 2020-10-18 ENCOUNTER — Other Ambulatory Visit: Payer: Self-pay | Admitting: Hematology & Oncology

## 2020-10-21 ENCOUNTER — Other Ambulatory Visit: Payer: Self-pay

## 2020-10-21 ENCOUNTER — Encounter: Payer: Self-pay | Admitting: Hematology & Oncology

## 2020-10-21 ENCOUNTER — Inpatient Hospital Stay (HOSPITAL_BASED_OUTPATIENT_CLINIC_OR_DEPARTMENT_OTHER): Payer: Medicare Other | Admitting: Hematology & Oncology

## 2020-10-21 ENCOUNTER — Inpatient Hospital Stay: Payer: Medicare Other

## 2020-10-21 ENCOUNTER — Inpatient Hospital Stay: Payer: Medicare Other | Attending: Hematology & Oncology

## 2020-10-21 VITALS — BP 149/44 | HR 66 | Temp 97.9°F | Resp 17 | Wt 177.0 lb

## 2020-10-21 VITALS — BP 144/54 | HR 69 | Temp 97.8°F | Resp 16

## 2020-10-21 DIAGNOSIS — C9110 Chronic lymphocytic leukemia of B-cell type not having achieved remission: Secondary | ICD-10-CM

## 2020-10-21 DIAGNOSIS — D509 Iron deficiency anemia, unspecified: Secondary | ICD-10-CM | POA: Insufficient documentation

## 2020-10-21 DIAGNOSIS — Z85038 Personal history of other malignant neoplasm of large intestine: Secondary | ICD-10-CM | POA: Diagnosis not present

## 2020-10-21 DIAGNOSIS — Z23 Encounter for immunization: Secondary | ICD-10-CM | POA: Insufficient documentation

## 2020-10-21 DIAGNOSIS — Z5112 Encounter for antineoplastic immunotherapy: Secondary | ICD-10-CM | POA: Diagnosis not present

## 2020-10-21 LAB — CBC WITH DIFFERENTIAL (CANCER CENTER ONLY)
Basophils Absolute: 0 10*3/uL (ref 0.0–0.1)
Basophils Relative: 0 %
Eosinophils Absolute: 0.1 10*3/uL (ref 0.0–0.5)
Eosinophils Relative: 1 %
HCT: 33.5 % — ABNORMAL LOW (ref 36.0–46.0)
Hemoglobin: 10.6 g/dL — ABNORMAL LOW (ref 12.0–15.0)
Lymphocytes Relative: 75 %
Lymphs Abs: 17.7 10*3/uL — ABNORMAL HIGH (ref 0.7–4.0)
MCH: 30.9 pg (ref 26.0–34.0)
MCHC: 31.6 g/dL (ref 30.0–36.0)
MCV: 97.7 fL (ref 80.0–100.0)
Monocytes Absolute: 0.8 10*3/uL (ref 0.1–1.0)
Monocytes Relative: 4 %
Neutro Abs: 4.6 10*3/uL (ref 1.7–7.7)
Neutrophils Relative %: 20 %
Platelet Count: 144 10*3/uL — ABNORMAL LOW (ref 150–400)
RBC: 3.43 MIL/uL — ABNORMAL LOW (ref 3.87–5.11)
RDW: 17.2 % — ABNORMAL HIGH (ref 11.5–15.5)
WBC Count: 23.4 10*3/uL — ABNORMAL HIGH (ref 4.0–10.5)
nRBC: 0 % (ref 0.0–0.2)
nRBC: 0 /100 WBC

## 2020-10-21 LAB — CMP (CANCER CENTER ONLY)
ALT: 11 U/L (ref 0–44)
AST: 15 U/L (ref 15–41)
Albumin: 4.1 g/dL (ref 3.5–5.0)
Alkaline Phosphatase: 60 U/L (ref 38–126)
Anion gap: 9 (ref 5–15)
BUN: 19 mg/dL (ref 8–23)
CO2: 25 mmol/L (ref 22–32)
Calcium: 8.7 mg/dL — ABNORMAL LOW (ref 8.9–10.3)
Chloride: 111 mmol/L (ref 98–111)
Creatinine: 1.26 mg/dL — ABNORMAL HIGH (ref 0.44–1.00)
GFR, Estimated: 43 mL/min — ABNORMAL LOW (ref >60)
Glucose, Bld: 81 mg/dL (ref 70–99)
Potassium: 3.6 mmol/L (ref 3.5–5.1)
Sodium: 145 mmol/L (ref 135–145)
Total Bilirubin: 0.5 mg/dL (ref 0.3–1.2)
Total Protein: 6.6 g/dL (ref 6.5–8.1)

## 2020-10-21 LAB — LACTATE DEHYDROGENASE: LDH: 156 U/L (ref 98–192)

## 2020-10-21 LAB — SAVE SMEAR(SSMR), FOR PROVIDER SLIDE REVIEW

## 2020-10-21 LAB — URIC ACID: Uric Acid, Serum: 5.2 mg/dL (ref 2.5–7.1)

## 2020-10-21 MED ORDER — METHYLPREDNISOLONE SODIUM SUCC 125 MG IJ SOLR
80.0000 mg | Freq: Once | INTRAMUSCULAR | Status: AC
  Administered 2020-10-21: 80 mg via INTRAVENOUS

## 2020-10-21 MED ORDER — MEPERIDINE HCL 25 MG/ML IJ SOLN
25.0000 mg | Freq: Once | INTRAMUSCULAR | Status: DC
  Filled 2020-10-21: qty 1

## 2020-10-21 MED ORDER — SODIUM CHLORIDE 0.9 % IV SOLN
375.0000 mg/m2 | Freq: Once | INTRAVENOUS | Status: AC
  Administered 2020-10-21: 700 mg via INTRAVENOUS
  Filled 2020-10-21: qty 50

## 2020-10-21 MED ORDER — DIPHENHYDRAMINE HCL 25 MG PO CAPS
50.0000 mg | ORAL_CAPSULE | Freq: Once | ORAL | Status: AC
  Administered 2020-10-21: 50 mg via ORAL
  Filled 2020-10-21: qty 2

## 2020-10-21 MED ORDER — ACETAMINOPHEN 325 MG PO TABS
650.0000 mg | ORAL_TABLET | Freq: Once | ORAL | Status: AC
  Administered 2020-10-21: 650 mg via ORAL
  Filled 2020-10-21: qty 2

## 2020-10-21 MED ORDER — SODIUM CHLORIDE 0.9 % IV SOLN: Freq: Once | INTRAVENOUS | Status: AC

## 2020-10-21 MED ORDER — INFLUENZA VAC SPLIT QUAD 0.5 ML IM SUSY
0.5000 mL | PREFILLED_SYRINGE | Freq: Once | INTRAMUSCULAR | Status: AC
  Administered 2020-10-21: 0.5 mL via INTRAMUSCULAR
  Filled 2020-10-21: qty 0.5

## 2020-10-21 MED ORDER — SODIUM CHLORIDE 0.9 % IV SOLN
40.0000 mg | Freq: Once | INTRAVENOUS | Status: AC
  Administered 2020-10-21: 40 mg via INTRAVENOUS
  Filled 2020-10-21: qty 4

## 2020-10-21 NOTE — Progress Notes (Signed)
Hematology and Oncology Follow Up Visit  [place] [id number] [birth year] 80 y.o. 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 Daily is here today for follow-up.  She had a little bit of the problem with the first Rituxan.  She has some rigors.  We finally got her through treatment.  Hopefully, she will have no problems with the second treatment.  I do not think we can give rapid Rituxan.  She is on acalabrutinib daily.  She has had a incredible response.  Her white cell count went down from 107,000 down to 23,000.  She has had no problems with cough or shortness of breath.  She has had no problems with nausea or vomiting.  There is been no diarrhea.  She has had no rashes.  There is been no leg swelling.  Overall, I would have to say that her performance status is ECOG 1.    Medications:  Allergies as of 10/21/2020       Reactions   Tramadol    Insomnia    Codeine    REACTION: makes her nervous, orTylenol #3   Ibuprofen    REACTION: nervous   Meperidine Hcl    REACTION: nasuea and vomitting   Naproxen Sodium    REACTION: nervous        Medication List        Accurate as of October 21, 2020 10:27 AM. If you have any questions, ask your nurse or doctor.          STOP taking these medications    allopurinol 100 MG tablet Commonly known as: ZYLOPRIM Stopped by: [name], MD       TAKE these medications    amLODipine 5 MG tablet Commonly known as: NORVASC TAKE 1 TABLET(5 MG) BY MOUTH DAILY   aspirin 81 MG chewable tablet Chew 1 tablet (81 mg total) by mouth 2 (two) times daily with a meal. What changed: when to take this   atenolol 100 MG tablet Commonly known as: TENORMIN TAKE 1 TABLET(100 MG) BY MOUTH  DAILY   atorvastatin 20 MG tablet Commonly known as: LIPITOR TAKE 1 TABLET(20 MG) BY MOUTH DAILY   azelastine 0.1 % nasal spray Commonly known as: ASTELIN Place 2 sprays into both nostrils at bedtime as needed for rhinitis or allergies. Use in each nostril as directed   Calquence 100 MG Tabs Generic drug: Acalabrutinib Maleate Take 100 mg by mouth daily.   cholecalciferol 1000 units tablet Commonly known as: VITAMIN D Take 1,000 Units by mouth daily.   clopidogrel 75 MG tablet Commonly known as: PLAVIX Take 1 tablet (75 mg total) by mouth daily.   clorazepate 7.5 MG tablet Commonly known as: TRANXENE Take 1 tablet (7.5 mg total) by mouth as needed.   fluticasone 50 MCG/ACT nasal spray Commonly known as: FLONASE SHAKE LIQUID AND USE 2 SPRAYS IN EACH NOSTRIL DAILY What changed: See the new instructions.   insulin NPH Human 100 UNIT/ML injection Commonly known as: NOVOLIN N Inject 38 units into the skin every morning and inject 24 units into the skin at bedtime.   insulin regular 100 units/mL injection Commonly known as: NOVOLIN R Inject 12 Units into the skin 2 (two) times daily before a meal.   INSULIN SYRINGE .  5CC/31GX5/16" 31G X 5/16" 0.5 ML Misc USE TO INJECT INSULIN 5 TIMES PER DAY   irbesartan 300 MG tablet Commonly known as: Avapro Take 0.5 tablets (150 mg total) by mouth daily.   isosorbide mononitrate 30 MG 24 hr tablet Commonly known as: IMDUR TAKE 1 TABLET BY MOUTH EVERY DAY   metFORMIN 500 MG tablet Commonly known as: GLUCOPHAGE TAKE 1 TABLET(500 MG) BY MOUTH DAILY WITH SUPPER   mometasone 0.1 % cream Commonly known as: ELOCON Apply 1 application topically daily. Use as directed   multivitamin capsule Take 1 capsule by mouth daily.   nitroGLYCERIN 0.3 MG SL tablet Commonly known as: Nitrostat Place 1 tablet (0.3 mg total) under the tongue every 5 (five) minutes as needed for chest pain (ER if no better after 3 tablets).   omeprazole 20 MG  capsule Commonly known as: PRILOSEC TAKE 1 CAPSULE(20 MG) BY MOUTH DAILY   ONE TOUCH ULTRA SYSTEM KIT w/Device Kit 1 kit by Does not apply route once.   OneTouch Ultra test strip Generic drug: glucose blood USE TO TEST BLOOD SUGAR 3 TIMES DAILY Dx code E11.65   prochlorperazine 10 MG tablet Commonly known as: COMPAZINE Take 1 tablet (10 mg total) by mouth every 6 (six) hours as needed for nausea or vomiting.   Victoza 18 MG/3ML Sopn Generic drug: liraglutide ADMINISTER 1.2 MG UNDER THE SKIN DAILY What changed: See the new instructions.        Allergies:  Allergies  Allergen Reactions   Tramadol     Insomnia    Codeine     REACTION: makes her nervous, orTylenol #3   Ibuprofen     REACTION: nervous   Meperidine Hcl     REACTION: nasuea and vomitting   Naproxen Sodium     REACTION: nervous    Past Medical History, Surgical history, Social history, and Family History were reviewed and updated.  Review of Systems: Review of Systems  Constitutional: Negative.   HENT: Negative.    Eyes: Negative.   Respiratory: Negative.    Cardiovascular: Negative.   Gastrointestinal: Negative.   Genitourinary: Negative.   Musculoskeletal: Negative.   Skin: Negative.   Neurological: Negative.   Endo/Heme/Allergies: Negative.   Psychiatric/Behavioral: Negative.      Physical Exam:  weight is 177 lb (80.3 kg). Her oral temperature is 97.9 F (36.6 C). Her blood pressure is 149/44 (abnormal) and her pulse is 66. Her respiration is 17 and oxygen saturation is 100%.   Wt Readings from Last 3 Encounters:  10/21/20 177 lb (80.3 kg)  09/20/20 182 lb (82.6 kg)  09/16/20 177 lb 9.6 oz (80.6 kg)    Physical Exam Vitals reviewed.  HENT:     Head: Normocephalic and atraumatic.  Eyes:     Pupils: Pupils are equal, round, and reactive to light.  Cardiovascular:     Rate and Rhythm: Normal rate and regular rhythm.     Heart sounds: Normal heart sounds.  Pulmonary:     Effort:  Pulmonary effort is normal.     Breath sounds: Normal breath sounds.  Abdominal:     General: Bowel sounds are normal.     Palpations: Abdomen is soft.  Musculoskeletal:        General: No tenderness or deformity. Normal range of motion.     Cervical back: Normal range of motion.  Lymphadenopathy:     Cervical: No cervical adenopathy.  Skin:    General: Skin is warm and dry.  Findings: No erythema or rash.  Neurological:     Mental Status: She is alert and oriented to person, place, and time.  Psychiatric:        Behavior: Behavior normal.        Thought Content: Thought content normal.        Judgment: Judgment normal.     Lab Results  Component Value Date   WBC 23.4 (H) 10/21/2020   HGB 10.6 (L) 10/21/2020   HCT 33.5 (L) 10/21/2020   MCV 97.7 10/21/2020   PLT 144 (L) 10/21/2020   Lab Results  Component Value Date   FERRITIN 90 08/24/2020   IRON 81 08/24/2020   TIBC 310 08/24/2020   UIBC 229 08/24/2020   IRONPCTSAT 26 08/24/2020   Lab Results  Component Value Date   RETICCTPCT 1.9 08/24/2020   RBC 3.43 (L) 10/21/2020   Lab Results  Component Value Date   KPAFRELGTCHN 56.5 (H) 06/23/2020   LAMBDASER 28.9 (H) 06/23/2020   KAPLAMBRATIO 1.96 (H) 06/23/2020   Lab Results  Component Value Date   IGGSERUM 802 06/23/2020   IGA 154 06/23/2020   IGMSERUM 19 (L) 06/23/2020   Lab Results  Component Value Date   TOTALPROTELP 6.2 06/23/2020   ALBUMINELP 3.6 06/23/2020   A1GS 0.2 06/23/2020   A2GS 0.9 06/23/2020   BETS 0.9 06/23/2020   BETA2SER 6.2 02/25/2014   GAMS 0.7 06/23/2020   MSPIKE Not Observed 06/23/2020   SPEI * 02/25/2014     Chemistry      Component Value Date/Time   NA 145 10/21/2020 0840   NA 147 (H) 11/03/2016 1018   NA 143 08/30/2016 0954   K 3.6 10/21/2020 0840   K 3.9 11/03/2016 1018   K 4.5 08/30/2016 0954   CL 111 10/21/2020 0840   CL 110 (H) 11/03/2016 1018   CO2 25 10/21/2020 0840   CO2 27 11/03/2016 1018   CO2 24 08/30/2016  0954   BUN 19 10/21/2020 0840   BUN 25 (H) 11/03/2016 1018   BUN 20.5 08/30/2016 0954   CREATININE 1.26 (H) 10/21/2020 0840   CREATININE 1.6 (H) 11/03/2016 1018   CREATININE 1.4 (H) 08/30/2016 0954      Component Value Date/Time   CALCIUM 8.7 (L) 10/21/2020 0840   CALCIUM 10.1 11/03/2016 1018   CALCIUM 9.8 08/30/2016 0954   ALKPHOS 60 10/21/2020 0840   ALKPHOS 112 (H) 11/03/2016 1018   ALKPHOS 113 08/30/2016 0954   AST 15 10/21/2020 0840   AST 23 08/30/2016 0954   ALT 11 10/21/2020 0840   ALT 29 11/03/2016 1018   ALT 20 08/30/2016 0954   BILITOT 0.5 10/21/2020 0840   BILITOT 0.38 08/30/2016 0954      Impression and Plan: Ms. Blackburn is a very pleasant 80 yo caucasian female with CLL stage A.  So far, we have not had any negative prognostic markers.  She does have the 13q-chromosomal abnormality.  This is typically construed as a good prognostic marker.  We will go ahead with the second Rituxan.  Again this will not be rapid infusion.  We will stop the allopurinol.  I do not think she needs this any longer as her white cell count is trending downward.  Is hard to say how much Rituxan we will give her.  I probably would like to give her 6 cycles total.  After that, we might add in venetoclax to the acalabrutinib.  I am just happy that her white cells have responded.  We  will plan to get her back in 1 more month.   [name], MD 10/20/202210:27 AM

## 2020-10-21 NOTE — Patient Instructions (Signed)
[facility]  Discharge Instructions: ?Thank you for choosing [facility] to provide your oncology and hematology care.  ? ?If you have a lab appointment with the Cancer Center, please go directly to the Cancer Center and check in at the registration area. ? ?Wear comfortable clothing and clothing appropriate for easy access to any Portacath or PICC line.  ? ?We strive to give you quality time with your provider. You may need to reschedule your appointment if you arrive late (15 or more minutes).  Arriving late affects you and other patients whose appointments are after yours.  Also, if you miss three or more appointments without notifying the office, you may be dismissed from the clinic at the provider?s discretion.    ?  ?For prescription refill requests, have your pharmacy contact our office and allow 72 hours for refills to be completed.   ? ?Today you received the following chemotherapy and/or immunotherapy agents Rituxan  ?  ?To help prevent nausea and vomiting after your treatment, we encourage you to take your nausea medication as directed. ? ?BELOW ARE SYMPTOMS THAT SHOULD BE REPORTED IMMEDIATELY: ?*FEVER GREATER THAN 100.4 F (38 ?C) OR HIGHER ?*CHILLS OR SWEATING ?*NAUSEA AND VOMITING THAT IS NOT CONTROLLED WITH YOUR NAUSEA MEDICATION ?*UNUSUAL SHORTNESS OF BREATH ?*UNUSUAL BRUISING OR BLEEDING ?*URINARY PROBLEMS (pain or burning when urinating, or frequent urination) ?*BOWEL PROBLEMS (unusual diarrhea, constipation, pain near the anus) ?TENDERNESS IN MOUTH AND THROAT WITH OR WITHOUT PRESENCE OF ULCERS (sore throat, sores in mouth, or a toothache) ?UNUSUAL RASH, SWELLING OR PAIN  ?UNUSUAL VAGINAL DISCHARGE OR ITCHING  ? ?Items with * indicate a potential emergency and should be followed up as soon as possible or go to the Emergency Department if any problems should occur. ? ?Please show the CHEMOTHERAPY ALERT CARD or IMMUNOTHERAPY ALERT CARD at check-in to the  Emergency Department and triage nurse. ?Should you have questions after your visit or need to cancel or reschedule your appointment, please contact [facility]  [phone] and follow the prompts.  Office hours are 8:00 a.m. to 4:30 p.m. Monday - Friday. Please note that voicemails left after 4:00 p.m. may not be returned until the following business day.  We are closed weekends and major holidays. You have access to a nurse at all times for urgent questions. Please call the main number to the clinic [phone] and follow the prompts. ? ?For any non-urgent questions, you may also contact your provider using MyChart. We now offer e-Visits for anyone 18 and older to request care online for non-urgent symptoms. For details visit [email or web address]. ?  ?Also download the MyChart app! Go to the app store, search "MyChart", open the app, select [facility], and log in with your MyChart username and password. ? ?Due to Covid, a mask is required upon entering the hospital/clinic. If you do not have a mask, one will be given to you upon arrival. For doctor visits, patients may have 1 support person aged 18 or older with them. For treatment visits, patients cannot have anyone with them due to current Covid guidelines and our immunocompromised population.  ?

## 2020-10-28 ENCOUNTER — Other Ambulatory Visit: Payer: Self-pay | Admitting: Internal Medicine

## 2020-10-28 ENCOUNTER — Other Ambulatory Visit: Payer: Self-pay | Admitting: Endocrinology

## 2020-10-28 DIAGNOSIS — Z1231 Encounter for screening mammogram for malignant neoplasm of breast: Secondary | ICD-10-CM

## 2020-10-29 ENCOUNTER — Telehealth: Payer: Self-pay

## 2020-10-29 IMAGING — CR DG HIP (WITH OR WITHOUT PELVIS) 2-3V LEFT
3 series · 3 of 3 positions shown · non-contrast
Comparison: None.

CLINICAL DATA: Fall, hip pain

EXAM:
DG HIP (WITH OR WITHOUT PELVIS) 2-3V LEFT

[x pelvis]
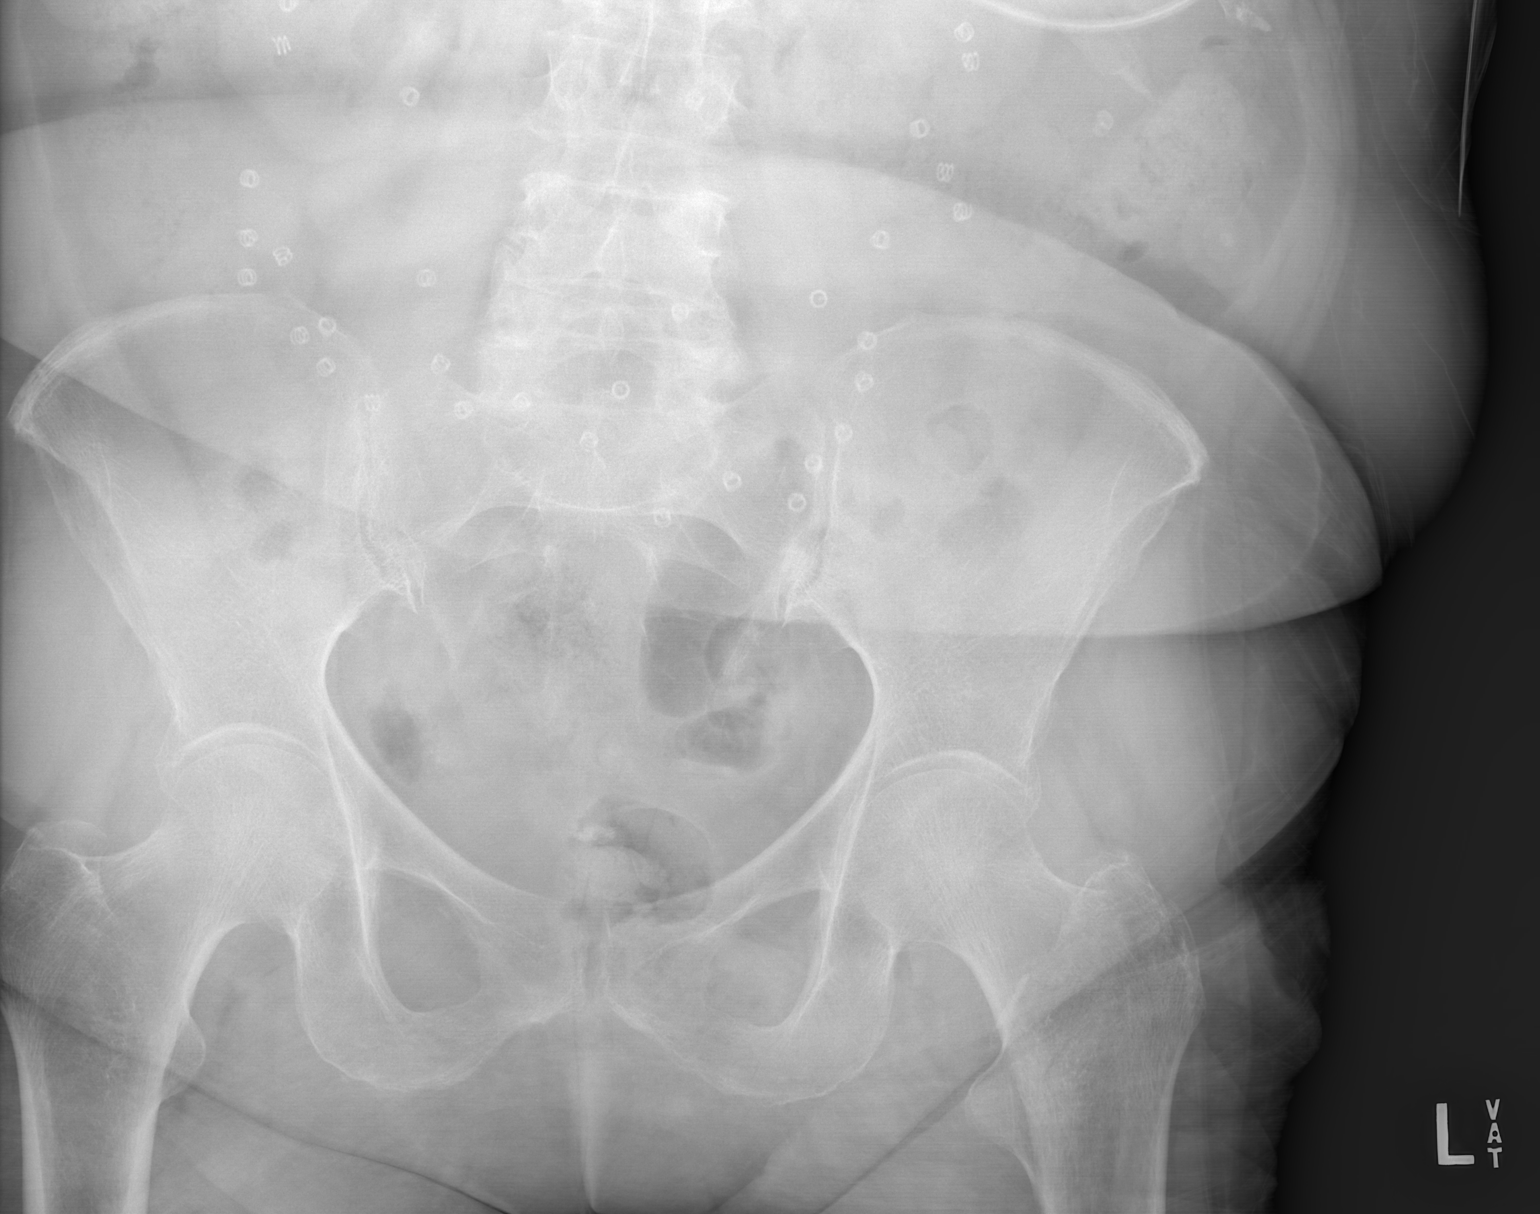

[x hip ap left]
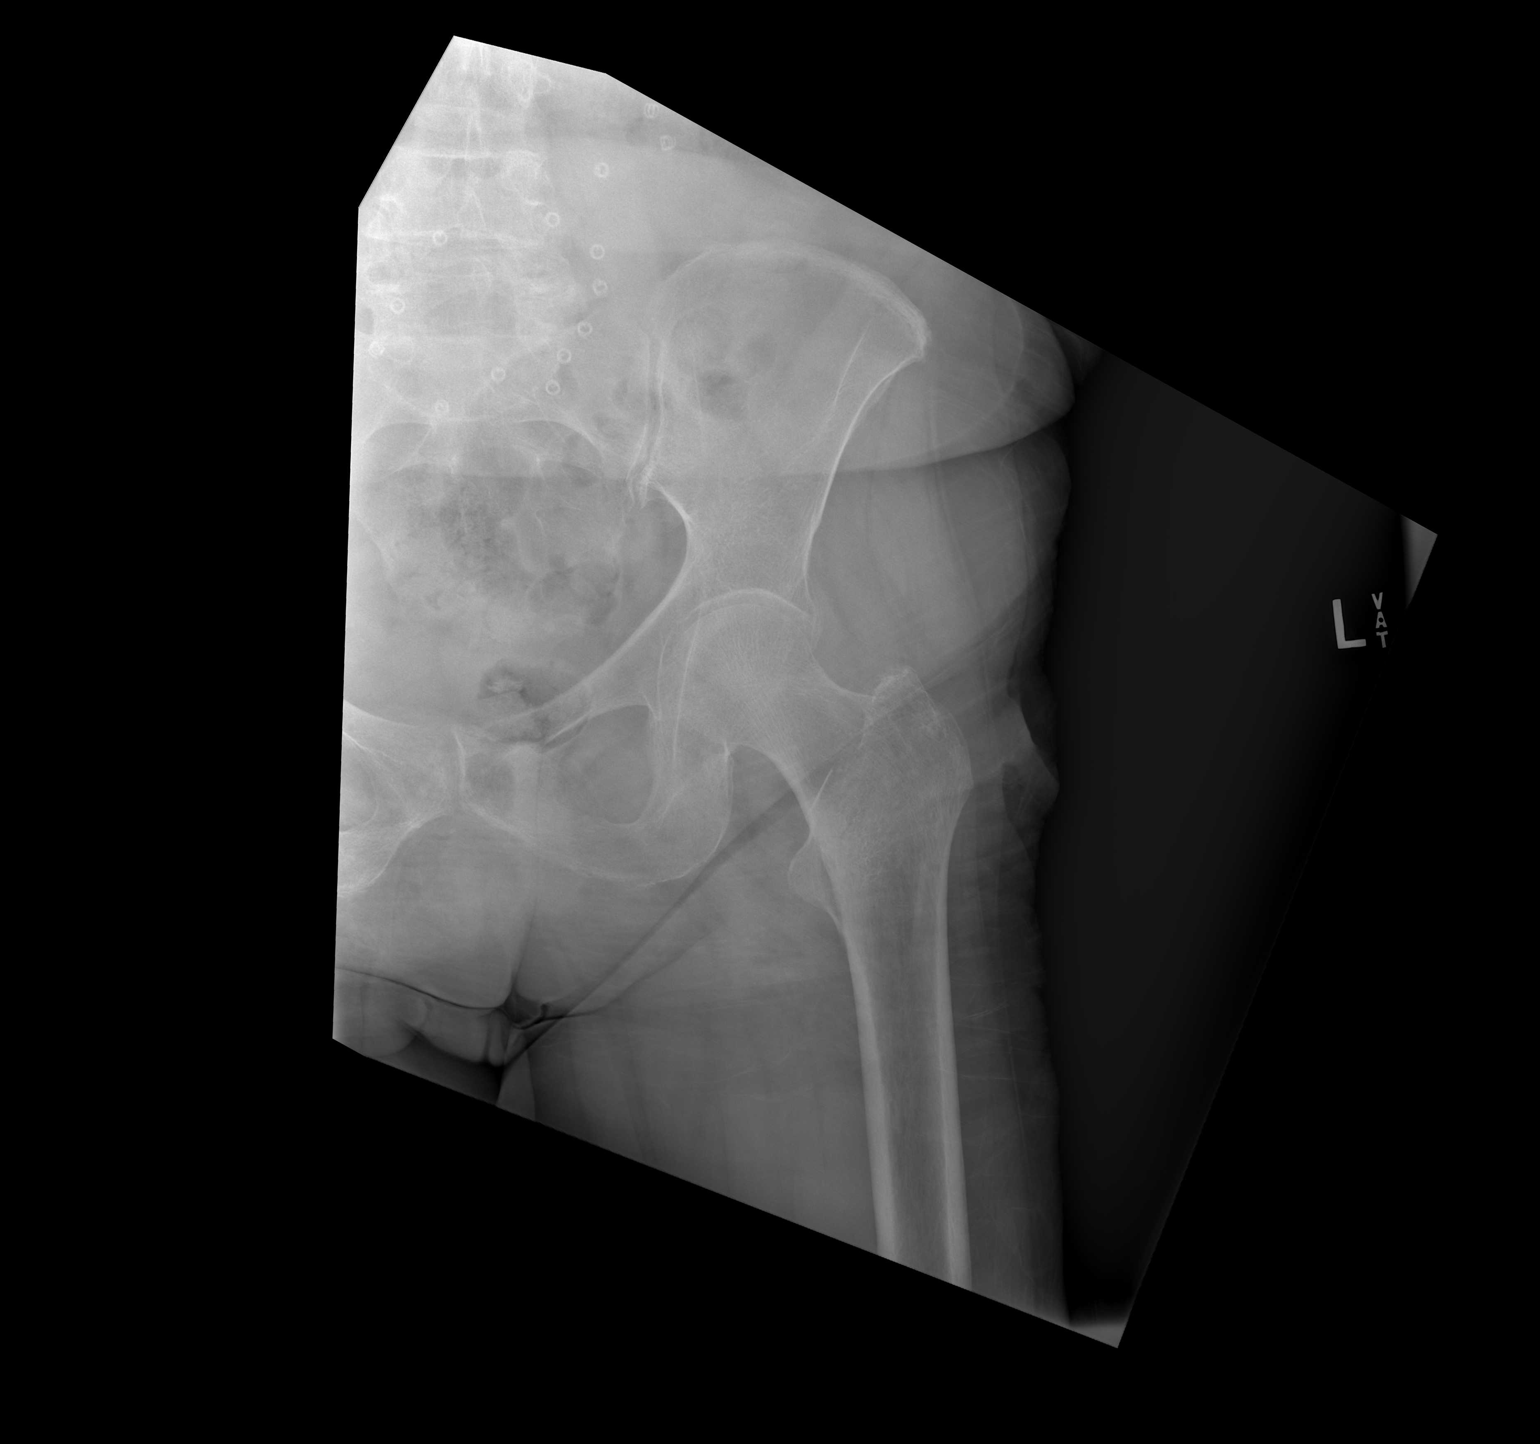

[w hip lat left]
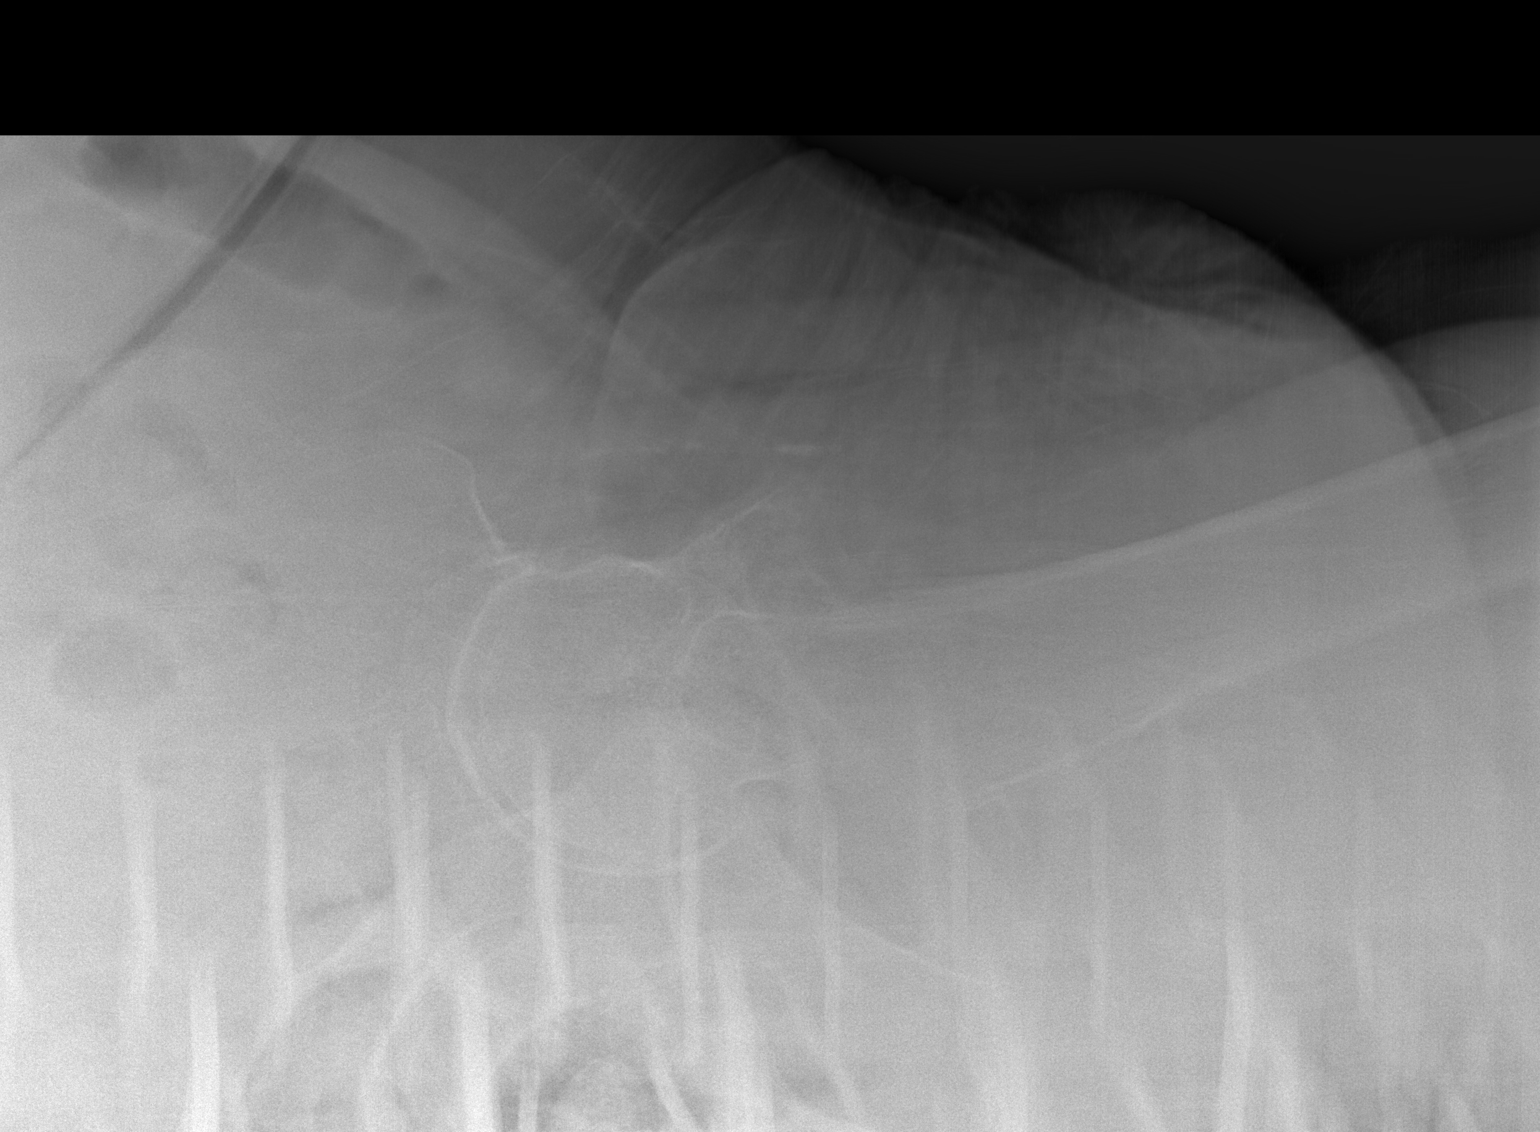

[3 of 3 positions shown; findings below may reference images not displayed]

FINDINGS: There is a left femoral intertrochanteric fracture. This is
nondisplaced. No angulation. No subluxation or dislocation.
IMPRESSION: No displaced left femoral intertrochanteric fracture.

## 2020-10-29 NOTE — Telephone Encounter (Signed)
Pt Reverified for Prolia, awaiting Summary of Benefits. Will call and schedule for appointment once received and reviewed with pt.

## 2020-11-08 ENCOUNTER — Other Ambulatory Visit: Payer: Self-pay | Admitting: Gastroenterology

## 2020-11-08 ENCOUNTER — Other Ambulatory Visit: Payer: Self-pay | Admitting: Cardiovascular Disease

## 2020-11-09 ENCOUNTER — Other Ambulatory Visit (HOSPITAL_COMMUNITY): Payer: Self-pay

## 2020-11-16 ENCOUNTER — Other Ambulatory Visit (HOSPITAL_COMMUNITY): Payer: Self-pay

## 2020-11-18 ENCOUNTER — Inpatient Hospital Stay: Payer: Medicare Other | Attending: Hematology & Oncology

## 2020-11-18 ENCOUNTER — Inpatient Hospital Stay (HOSPITAL_BASED_OUTPATIENT_CLINIC_OR_DEPARTMENT_OTHER): Payer: Medicare Other | Admitting: Hematology & Oncology

## 2020-11-18 ENCOUNTER — Telehealth: Payer: Self-pay | Admitting: *Deleted

## 2020-11-18 ENCOUNTER — Encounter: Payer: Self-pay | Admitting: Hematology & Oncology

## 2020-11-18 ENCOUNTER — Other Ambulatory Visit: Payer: Self-pay

## 2020-11-18 ENCOUNTER — Inpatient Hospital Stay: Payer: Medicare Other

## 2020-11-18 VITALS — BP 141/49 | HR 64 | Temp 97.9°F | Resp 18 | Wt 178.1 lb

## 2020-11-18 VITALS — BP 134/54 | HR 84 | Temp 97.5°F | Resp 18

## 2020-11-18 DIAGNOSIS — E119 Type 2 diabetes mellitus without complications: Secondary | ICD-10-CM | POA: Insufficient documentation

## 2020-11-18 DIAGNOSIS — Z85038 Personal history of other malignant neoplasm of large intestine: Secondary | ICD-10-CM | POA: Diagnosis not present

## 2020-11-18 DIAGNOSIS — D509 Iron deficiency anemia, unspecified: Secondary | ICD-10-CM | POA: Diagnosis not present

## 2020-11-18 DIAGNOSIS — C9110 Chronic lymphocytic leukemia of B-cell type not having achieved remission: Secondary | ICD-10-CM

## 2020-11-18 DIAGNOSIS — Z5112 Encounter for antineoplastic immunotherapy: Secondary | ICD-10-CM | POA: Insufficient documentation

## 2020-11-18 LAB — CBC WITH DIFFERENTIAL (CANCER CENTER ONLY)
Abs Immature Granulocytes: 0.04 10*3/uL (ref 0.00–0.07)
Basophils Absolute: 0 10*3/uL (ref 0.0–0.1)
Basophils Relative: 0 %
Eosinophils Absolute: 0.2 10*3/uL (ref 0.0–0.5)
Eosinophils Relative: 1 %
HCT: 34.5 % — ABNORMAL LOW (ref 36.0–46.0)
Hemoglobin: 11 g/dL — ABNORMAL LOW (ref 12.0–15.0)
Immature Granulocytes: 0 %
Lymphocytes Relative: 65 %
Lymphs Abs: 9.3 10*3/uL — ABNORMAL HIGH (ref 0.7–4.0)
MCH: 31.5 pg (ref 26.0–34.0)
MCHC: 31.9 g/dL (ref 30.0–36.0)
MCV: 98.9 fL (ref 80.0–100.0)
Monocytes Absolute: 0.8 10*3/uL (ref 0.1–1.0)
Monocytes Relative: 5 %
Neutro Abs: 4.2 10*3/uL (ref 1.7–7.7)
Neutrophils Relative %: 29 %
Platelet Count: 151 10*3/uL (ref 150–400)
RBC: 3.49 MIL/uL — ABNORMAL LOW (ref 3.87–5.11)
RDW: 15.6 % — ABNORMAL HIGH (ref 11.5–15.5)
Smear Review: NORMAL
WBC Count: 14.5 10*3/uL — ABNORMAL HIGH (ref 4.0–10.5)
nRBC: 0 % (ref 0.0–0.2)

## 2020-11-18 LAB — CMP (CANCER CENTER ONLY)
ALT: 11 U/L (ref 0–44)
AST: 16 U/L (ref 15–41)
Albumin: 4.1 g/dL (ref 3.5–5.0)
Alkaline Phosphatase: 60 U/L (ref 38–126)
Anion gap: 8 (ref 5–15)
BUN: 22 mg/dL (ref 8–23)
CO2: 26 mmol/L (ref 22–32)
Calcium: 9.8 mg/dL (ref 8.9–10.3)
Chloride: 110 mmol/L (ref 98–111)
Creatinine: 1.35 mg/dL — ABNORMAL HIGH (ref 0.44–1.00)
GFR, Estimated: 39 mL/min — ABNORMAL LOW (ref >60)
Glucose, Bld: 80 mg/dL (ref 70–99)
Potassium: 4.1 mmol/L (ref 3.5–5.1)
Sodium: 144 mmol/L (ref 135–145)
Total Bilirubin: 0.5 mg/dL (ref 0.3–1.2)
Total Protein: 6.8 g/dL (ref 6.5–8.1)

## 2020-11-18 LAB — SAVE SMEAR(SSMR), FOR PROVIDER SLIDE REVIEW

## 2020-11-18 LAB — LACTATE DEHYDROGENASE: LDH: 152 U/L (ref 98–192)

## 2020-11-18 MED ORDER — ACETAMINOPHEN 325 MG PO TABS
650.0000 mg | ORAL_TABLET | Freq: Once | ORAL | Status: AC
  Administered 2020-11-18: 10:00:00 650 mg via ORAL
  Filled 2020-11-18: qty 2

## 2020-11-18 MED ORDER — SODIUM CHLORIDE 0.9 % IV SOLN
375.0000 mg/m2 | Freq: Once | INTRAVENOUS | Status: AC
  Administered 2020-11-18: 11:00:00 700 mg via INTRAVENOUS
  Filled 2020-11-18: qty 50

## 2020-11-18 MED ORDER — SODIUM CHLORIDE 0.9 % IV SOLN
Freq: Once | INTRAVENOUS | Status: AC
Start: 2020-11-18 — End: 2020-11-18

## 2020-11-18 MED ORDER — DIPHENHYDRAMINE HCL 25 MG PO CAPS
50.0000 mg | ORAL_CAPSULE | Freq: Once | ORAL | Status: AC
  Administered 2020-11-18: 10:00:00 50 mg via ORAL
  Filled 2020-11-18: qty 2

## 2020-11-18 MED ORDER — SODIUM CHLORIDE 0.9 % IV SOLN
40.0000 mg | Freq: Once | INTRAVENOUS | Status: AC
  Administered 2020-11-18: 11:00:00 40 mg via INTRAVENOUS
  Filled 2020-11-18: qty 4

## 2020-11-18 MED ORDER — METHYLPREDNISOLONE SODIUM SUCC 125 MG IJ SOLR
80.0000 mg | Freq: Once | INTRAMUSCULAR | Status: AC
  Administered 2020-11-18: 10:00:00 80 mg via INTRAVENOUS
  Filled 2020-11-18: qty 2

## 2020-11-18 NOTE — Patient Instructions (Signed)
[facility]  Discharge Instructions: ?Thank you for choosing [facility] to provide your oncology and hematology care.  ? ?If you have a lab appointment with the Cancer Center, please go directly to the Cancer Center and check in at the registration area. ? ?Wear comfortable clothing and clothing appropriate for easy access to any Portacath or PICC line.  ? ?We strive to give you quality time with your provider. You may need to reschedule your appointment if you arrive late (15 or more minutes).  Arriving late affects you and other patients whose appointments are after yours.  Also, if you miss three or more appointments without notifying the office, you may be dismissed from the clinic at the provider?s discretion.    ?  ?For prescription refill requests, have your pharmacy contact our office and allow 72 hours for refills to be completed.   ? ?Today you received the following chemotherapy and/or immunotherapy agents Rituxan  ?  ?To help prevent nausea and vomiting after your treatment, we encourage you to take your nausea medication as directed. ? ?BELOW ARE SYMPTOMS THAT SHOULD BE REPORTED IMMEDIATELY: ?*FEVER GREATER THAN 100.4 F (38 ?C) OR HIGHER ?*CHILLS OR SWEATING ?*NAUSEA AND VOMITING THAT IS NOT CONTROLLED WITH YOUR NAUSEA MEDICATION ?*UNUSUAL SHORTNESS OF BREATH ?*UNUSUAL BRUISING OR BLEEDING ?*URINARY PROBLEMS (pain or burning when urinating, or frequent urination) ?*BOWEL PROBLEMS (unusual diarrhea, constipation, pain near the anus) ?TENDERNESS IN MOUTH AND THROAT WITH OR WITHOUT PRESENCE OF ULCERS (sore throat, sores in mouth, or a toothache) ?UNUSUAL RASH, SWELLING OR PAIN  ?UNUSUAL VAGINAL DISCHARGE OR ITCHING  ? ?Items with * indicate a potential emergency and should be followed up as soon as possible or go to the Emergency Department if any problems should occur. ? ?Please show the CHEMOTHERAPY ALERT CARD or IMMUNOTHERAPY ALERT CARD at check-in to the  Emergency Department and triage nurse. ?Should you have questions after your visit or need to cancel or reschedule your appointment, please contact [facility]  [phone] and follow the prompts.  Office hours are 8:00 a.m. to 4:30 p.m. Monday - Friday. Please note that voicemails left after 4:00 p.m. may not be returned until the following business day.  We are closed weekends and major holidays. You have access to a nurse at all times for urgent questions. Please call the main number to the clinic [phone] and follow the prompts. ? ?For any non-urgent questions, you may also contact your provider using MyChart. We now offer e-Visits for anyone 18 and older to request care online for non-urgent symptoms. For details visit [email or web address]. ?  ?Also download the MyChart app! Go to the app store, search "MyChart", open the app, select [facility], and log in with your MyChart username and password. ? ?Due to Covid, a mask is required upon entering the hospital/clinic. If you do not have a mask, one will be given to you upon arrival. For doctor visits, patients may have 1 support person aged 18 or older with them. For treatment visits, patients cannot have anyone with them due to current Covid guidelines and our immunocompromised population.  ?

## 2020-11-18 NOTE — Telephone Encounter (Signed)
Per 11/18/20 los - gave upcoming appointments - confirmed

## 2020-11-18 NOTE — Addendum Note (Signed)
Addended by: Burney Gauze R on: 11/18/2020 10:02 AM   Modules accepted: Orders

## 2020-11-18 NOTE — Progress Notes (Signed)
Hematology and Oncology Follow Up Visit  McCormick [id number] 24-Nov-1940 80 y.o. 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  She looks fantastic.  She has had no problems with subsequent Rituxan treatments.  She is getting ready for Thanksgiving.  She will have a nice Thanksgiving with her family.  She has had no issues with nausea or vomiting.  She has had no problems with bowels or bladder.  She has had no swollen lymph nodes.  There is been no problems with rashes.  She has had no fever.  There is been no leg swelling..  She does have diabetes.  She is watching this quite closely..   She is on the acalabrutinib.  She is only on the 100 mg daily of acalabrutinib she is doing well on acalabrutinib.    Currently, her performance status is ECOG 1.    Medications:  Allergies as of 11/18/2020       Reactions   Tramadol    Insomnia    Codeine    REACTION: makes her nervous, orTylenol #3   Ibuprofen    REACTION: nervous   Meperidine Hcl    REACTION: nasuea and vomitting   Naproxen Sodium    REACTION: nervous        Medication List        Accurate as of November 18, 2020  9:47 AM. If you have any questions, ask your nurse or doctor.          amLODipine 5 MG tablet Commonly known as: NORVASC TAKE 1 TABLET(5 MG) BY MOUTH DAILY   aspirin 81 MG chewable tablet Chew 1 tablet (81 mg total) by mouth 2 (two) times daily with a meal. What changed: when to take this   atenolol 100 MG tablet Commonly known as: TENORMIN TAKE 1 TABLET(100 MG) BY MOUTH DAILY   atorvastatin 20 MG tablet Commonly known as: LIPITOR TAKE 1 TABLET(20 MG) BY MOUTH DAILY   azelastine 0.1 % nasal spray Commonly known as:  ASTELIN Place 2 sprays into both nostrils at bedtime as needed for rhinitis or allergies. Use in each nostril as directed   Calquence 100 MG Tabs Generic drug: Acalabrutinib Maleate Take 100 mg by mouth daily.   cholecalciferol 1000 units tablet Commonly known as: VITAMIN D Take 1,000 Units by mouth daily.   clopidogrel 75 MG tablet Commonly known as: PLAVIX Take 1 tablet (75 mg total) by mouth daily.   clorazepate 7.5 MG tablet Commonly known as: TRANXENE Take 1 tablet (7.5 mg total) by mouth as needed.   fluticasone 50 MCG/ACT nasal spray Commonly known as: FLONASE SHAKE LIQUID AND USE 2 SPRAYS IN EACH NOSTRIL DAILY What changed: See the new instructions.   insulin NPH Human 100 UNIT/ML injection Commonly known as: NOVOLIN N Inject 38 units into the skin every morning and inject 24 units into the skin at bedtime.   insulin regular 100 units/mL injection Commonly known as: NOVOLIN R Inject 12 Units into the skin 2 (two) times daily before a meal.   INSULIN SYRINGE .5CC/31GX5/16" 31G X 5/16" 0.5 ML Misc USE TO INJECT INSULIN 5 TIMES PER DAY   irbesartan 300 MG tablet Commonly known as: Avapro Take 0.5 tablets (150 mg total) by  mouth daily.   isosorbide mononitrate 30 MG 24 hr tablet Commonly known as: IMDUR TAKE 1 TABLET BY MOUTH EVERY DAY   metFORMIN 500 MG tablet Commonly known as: GLUCOPHAGE TAKE 1 TABLET(500 MG) BY MOUTH DAILY WITH SUPPER   mometasone 0.1 % cream Commonly known as: ELOCON Apply 1 application topically daily. Use as directed   multivitamin capsule Take 1 capsule by mouth daily.   nitroGLYCERIN 0.3 MG SL tablet Commonly known as: Nitrostat Place 1 tablet (0.3 mg total) under the tongue every 5 (five) minutes as needed for chest pain (ER if no better after 3 tablets).   omeprazole 20 MG capsule Commonly known as: PRILOSEC TAKE 1 CAPSULE(20 MG) BY MOUTH DAILY   ONE TOUCH ULTRA SYSTEM KIT w/Device Kit 1 kit by Does not apply route once.    OneTouch Ultra test strip Generic drug: glucose blood USE TO TEST BLOOD SUGAR 3 TIMES DAILY Dx code E11.65   prochlorperazine 10 MG tablet Commonly known as: COMPAZINE Take 1 tablet (10 mg total) by mouth every 6 (six) hours as needed for nausea or vomiting.   Victoza 18 MG/3ML Sopn Generic drug: liraglutide ADMINISTER 1.2 MG UNDER THE SKIN DAILY        Allergies:  Allergies  Allergen Reactions   Tramadol     Insomnia    Codeine     REACTION: makes her nervous, orTylenol #3   Ibuprofen     REACTION: nervous   Meperidine Hcl     REACTION: nasuea and vomitting   Naproxen Sodium     REACTION: nervous    Past Medical History, Surgical history, Social history, and Family History were reviewed and updated.  Review of Systems: Review of Systems  Constitutional: Negative.   HENT: Negative.    Eyes: Negative.   Respiratory: Negative.    Cardiovascular: Negative.   Gastrointestinal: Negative.   Genitourinary: Negative.   Musculoskeletal: Negative.   Skin: Negative.   Neurological: Negative.   Endo/Heme/Allergies: Negative.   Psychiatric/Behavioral: Negative.      Physical Exam:  weight is 178 lb 1.9 oz (80.8 kg). Her oral temperature is 97.9 F (36.6 C). Her blood pressure is 141/49 (abnormal) and her pulse is 64. Her respiration is 18 and oxygen saturation is 100%.   Wt Readings from Last 3 Encounters:  11/18/20 178 lb 1.9 oz (80.8 kg)  10/21/20 177 lb (80.3 kg)  09/20/20 182 lb (82.6 kg)    Physical Exam Vitals reviewed.  HENT:     Head: Normocephalic and atraumatic.  Eyes:     Pupils: Pupils are equal, round, and reactive to light.  Cardiovascular:     Rate and Rhythm: Normal rate and regular rhythm.     Heart sounds: Normal heart sounds.  Pulmonary:     Effort: Pulmonary effort is normal.     Breath sounds: Normal breath sounds.  Abdominal:     General: Bowel sounds are normal.     Palpations: Abdomen is soft.  Musculoskeletal:        General: No  tenderness or deformity. Normal range of motion.     Cervical back: Normal range of motion.  Lymphadenopathy:     Cervical: No cervical adenopathy.  Skin:    General: Skin is warm and dry.     Findings: No erythema or rash.  Neurological:     Mental Status: She is alert and oriented to person, place, and time.  Psychiatric:        Behavior: Behavior normal.  Thought Content: Thought content normal.        Judgment: Judgment normal.     Lab Results  Component Value Date   WBC 14.5 (H) 11/18/2020   HGB 11.0 (L) 11/18/2020   HCT 34.5 (L) 11/18/2020   MCV 98.9 11/18/2020   PLT 151 11/18/2020   Lab Results  Component Value Date   FERRITIN 90 08/24/2020   IRON 81 08/24/2020   TIBC 310 08/24/2020   UIBC 229 08/24/2020   IRONPCTSAT 26 08/24/2020   Lab Results  Component Value Date   RETICCTPCT 1.9 08/24/2020   RBC 3.49 (L) 11/18/2020   Lab Results  Component Value Date   KPAFRELGTCHN 56.5 (H) 06/23/2020   LAMBDASER 28.9 (H) 06/23/2020   KAPLAMBRATIO 1.96 (H) 06/23/2020   Lab Results  Component Value Date   IGGSERUM 802 06/23/2020   IGA 154 06/23/2020   IGMSERUM 19 (L) 06/23/2020   Lab Results  Component Value Date   TOTALPROTELP 6.2 06/23/2020   ALBUMINELP 3.6 06/23/2020   A1GS 0.2 06/23/2020   A2GS 0.9 06/23/2020   BETS 0.9 06/23/2020   BETA2SER 6.2 02/25/2014   GAMS 0.7 06/23/2020   MSPIKE Not Observed 06/23/2020   SPEI * 02/25/2014     Chemistry      Component Value Date/Time   NA 144 11/18/2020 0836   NA 147 (H) 11/03/2016 1018   NA 143 08/30/2016 0954   K 4.1 11/18/2020 0836   K 3.9 11/03/2016 1018   K 4.5 08/30/2016 0954   CL 110 11/18/2020 0836   CL 110 (H) 11/03/2016 1018   CO2 26 11/18/2020 0836   CO2 27 11/03/2016 1018   CO2 24 08/30/2016 0954   BUN 22 11/18/2020 0836   BUN 25 (H) 11/03/2016 1018   BUN 20.5 08/30/2016 0954   CREATININE 1.35 (H) 11/18/2020 0836   CREATININE 1.6 (H) 11/03/2016 1018   CREATININE 1.4 (H) 08/30/2016  0954      Component Value Date/Time   CALCIUM 9.8 11/18/2020 0836   CALCIUM 10.1 11/03/2016 1018   CALCIUM 9.8 08/30/2016 0954   ALKPHOS 60 11/18/2020 0836   ALKPHOS 112 (H) 11/03/2016 1018   ALKPHOS 113 08/30/2016 0954   AST 16 11/18/2020 0836   AST 23 08/30/2016 0954   ALT 11 11/18/2020 0836   ALT 29 11/03/2016 1018   ALT 20 08/30/2016 0954   BILITOT 0.5 11/18/2020 0836   BILITOT 0.38 08/30/2016 0954      Impression and Plan: Ms. Wollenberg is a very pleasant 80 yo caucasian female with CLL stage A.  So far, we have not had any negative prognostic markers.  She does have the 13q-chromosomal abnormality.  This is typically construed as a good prognostic marker.  We will go ahead with the 3rd Rituxan.  Again this will not be rapid infusion.  At some point, I will probably add venetoclax.  I would then drop the Rituxan.  I would give 6 cycles of Rituxan and then add the venetoclax.  Will plan to get her back in another month.   [name], MD 11/17/20229:47 AM

## 2020-11-19 ENCOUNTER — Other Ambulatory Visit: Payer: Self-pay | Admitting: Cardiovascular Disease

## 2020-11-19 LAB — KAPPA/LAMBDA LIGHT CHAINS
Kappa free light chain: 40.8 mg/L — ABNORMAL HIGH (ref 3.3–19.4)
Kappa, lambda light chain ratio: 1.39 (ref 0.26–1.65)
Lambda free light chains: 29.3 mg/L — ABNORMAL HIGH (ref 5.7–26.3)

## 2020-11-19 LAB — IGG, IGA, IGM
IgA: 163 mg/dL (ref 64–422)
IgG (Immunoglobin G), Serum: 723 mg/dL (ref 586–1602)
IgM (Immunoglobulin M), Srm: 29 mg/dL (ref 26–217)

## 2020-11-22 ENCOUNTER — Other Ambulatory Visit (HOSPITAL_COMMUNITY): Payer: Self-pay

## 2020-11-22 ENCOUNTER — Other Ambulatory Visit: Payer: Self-pay | Admitting: Hematology & Oncology

## 2020-11-22 LAB — PROTEIN ELECTROPHORESIS, SERUM, WITH REFLEX
A/G Ratio: 1.2 (ref 0.7–1.7)
Albumin ELP: 3.6 g/dL (ref 2.9–4.4)
Alpha-1-Globulin: 0.3 g/dL (ref 0.0–0.4)
Alpha-2-Globulin: 0.9 g/dL (ref 0.4–1.0)
Beta Globulin: 1 g/dL (ref 0.7–1.3)
Gamma Globulin: 0.7 g/dL (ref 0.4–1.8)
Globulin, Total: 2.9 g/dL (ref 2.2–3.9)
Total Protein ELP: 6.5 g/dL (ref 6.0–8.5)

## 2020-11-24 ENCOUNTER — Other Ambulatory Visit: Payer: Self-pay | Admitting: Gastroenterology

## 2020-11-27 ENCOUNTER — Other Ambulatory Visit: Payer: Self-pay | Admitting: Endocrinology

## 2020-12-02 ENCOUNTER — Other Ambulatory Visit: Payer: Self-pay

## 2020-12-02 ENCOUNTER — Ambulatory Visit
Admission: RE | Admit: 2020-12-02 | Discharge: 2020-12-02 | Disposition: A | Discharge status: Home / Self Care | Payer: Medicare Other | Source: Ambulatory Visit | Attending: Internal Medicine | Admitting: Internal Medicine

## 2020-12-02 DIAGNOSIS — Z1231 Encounter for screening mammogram for malignant neoplasm of breast: Secondary | ICD-10-CM | POA: Diagnosis not present

## 2020-12-08 ENCOUNTER — Other Ambulatory Visit (HOSPITAL_COMMUNITY): Payer: Self-pay

## 2020-12-09 ENCOUNTER — Other Ambulatory Visit: Payer: Self-pay

## 2020-12-09 ENCOUNTER — Telehealth: Payer: Self-pay | Admitting: Endocrinology

## 2020-12-09 ENCOUNTER — Other Ambulatory Visit (INDEPENDENT_AMBULATORY_CARE_PROVIDER_SITE_OTHER): Payer: Medicare Other

## 2020-12-09 DIAGNOSIS — Z794 Long term (current) use of insulin: Secondary | ICD-10-CM

## 2020-12-09 DIAGNOSIS — E1165 Type 2 diabetes mellitus with hyperglycemia: Secondary | ICD-10-CM | POA: Diagnosis not present

## 2020-12-09 LAB — BASIC METABOLIC PANEL
BUN: 20 mg/dL (ref 6–23)
CO2: 27 mEq/L (ref 19–32)
Calcium: 9.7 mg/dL (ref 8.4–10.5)
Chloride: 108 mEq/L (ref 96–112)
Creatinine, Ser: 1.38 mg/dL — ABNORMAL HIGH (ref 0.40–1.20)
GFR: 35.96 mL/min — ABNORMAL LOW (ref >60.00)
Glucose, Bld: 43 mg/dL — CL (ref 70–99)
Potassium: 4.2 mEq/L (ref 3.5–5.1)
Sodium: 142 mEq/L (ref 135–145)

## 2020-12-09 LAB — MICROALBUMIN / CREATININE URINE RATIO
Creatinine,U: 74.6 mg/dL
Microalb Creat Ratio: 12.7 mg/g (ref 0.0–30.0)
Microalb, Ur: 9.5 mg/dL — ABNORMAL HIGH (ref 0.0–1.9)

## 2020-12-09 LAB — HEMOGLOBIN A1C: Hgb A1c MFr Bld: 5.9 % (ref 4.6–6.5)

## 2020-12-09 NOTE — Telephone Encounter (Signed)
Blood sugar 43 in the lab, she will take 36 NPH and 12 regular instead of 38 NPH and 15 regular in the mornings

## 2020-12-13 ENCOUNTER — Other Ambulatory Visit (HOSPITAL_COMMUNITY): Payer: Self-pay

## 2020-12-14 ENCOUNTER — Ambulatory Visit (INDEPENDENT_AMBULATORY_CARE_PROVIDER_SITE_OTHER): Payer: Medicare Other | Admitting: Internal Medicine

## 2020-12-14 ENCOUNTER — Encounter: Payer: Self-pay | Admitting: Internal Medicine

## 2020-12-14 VITALS — BP 134/78 | HR 68 | Temp 98.0°F | Resp 18 | Ht 66.0 in | Wt 177.2 lb

## 2020-12-14 DIAGNOSIS — C9110 Chronic lymphocytic leukemia of B-cell type not having achieved remission: Secondary | ICD-10-CM

## 2020-12-14 DIAGNOSIS — M810 Age-related osteoporosis without current pathological fracture: Secondary | ICD-10-CM | POA: Diagnosis not present

## 2020-12-14 DIAGNOSIS — I25118 Atherosclerotic heart disease of native coronary artery with other forms of angina pectoris: Secondary | ICD-10-CM

## 2020-12-14 DIAGNOSIS — R0609 Other forms of dyspnea: Secondary | ICD-10-CM | POA: Diagnosis not present

## 2020-12-14 MED ORDER — DENOSUMAB 60 MG/ML ~~LOC~~ SOSY
60.0000 mg | PREFILLED_SYRINGE | Freq: Once | SUBCUTANEOUS | Status: AC
  Administered 2020-12-14: 60 mg via SUBCUTANEOUS

## 2020-12-14 NOTE — Patient Instructions (Addendum)
Per our records you are due for your diabetic eye exam. Please contact your eye doctor to schedule an appointment. Please have them send copies of your office visit notes to [place]. Our fax number is [phone]. If you need a referral to an eye doctor please let us know.   Vaccines I recommend:   COVID-vaccine Tetanus shot (Tdap) At some point Shingrix    Check the  blood pressure regularly BP GOAL is between 110/65 and  135/85. If it is consistently higher or lower, let me know     GO TO THE FRONT DESK, [place] back for a checkup in 6 months

## 2020-12-14 NOTE — Progress Notes (Signed)
Subjective:    Patient ID: Ector, female    DOB: [birth year], 80 y.o.   MRN: [id number]  DOS:  12/14/2020 Type of visit - description: Follow-up  Since the last visit saw oncology and Endo.  Notes reviewed.  Good compliance with medications. Uses Tranxene very seldom.  For a while complained of DOE only with "overexertion".  Able to do all her ADLs without problems.  Able to work on her yard x  30 minutes before develops DOE. Denies chest pain, cough, palpitations.   Review of Systems See above   Past Medical History:  Diagnosis Date   Acute blood loss anemia 09/17/2015   Allergy    Anemia    Anginal pain ([place]) 2017   Anxiety    Arthritis    "hands and knees mostly" (09/16/2015)   B12 deficiency anemia    CAD (coronary artery disease)    Dr Gwenlyn Found   Cataract    bil cataracts removed   CLL (chronic lymphocytic leukemia) ([place]) 02/25/2014   Clotting disorder ([place])    Depression    DJD (degenerative joint disease)    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   Gastritis    GERD (gastroesophageal reflux disease)    Headache(784.0)    History of blood transfusion    "I"ve had 20 some; thru birth of children, loss of blood, last 2 were in ~ 1999 before my cancer surgery" (09/16/2015)   History of hiatal hernia    History of shingles    Hx of colonic polyps    Hyperlipidemia    Hypertension    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   Myocardial infarction ([place])    1997   Pneumonia    X 1   Postoperative hematoma involving circulatory system following cardiac catheterization 09/17/2015   S/P cardiac cath 09/16/15 09/17/2015   Type II diabetes mellitus ([place])    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[facility])    as a child   Venous insufficiency     Past Surgical History:  Procedure Laterality Date   [place]; 2008; 09/16/2015   CARDIAC CATHETERIZATION N/A 09/16/2015   Procedure: Right/Left  Heart Cath and Coronary/Graft Angiography;  Surgeon: Lorretta Harp, MD;  Location: [facility];  Service: Cardiovascular;  Laterality: N/A;   CESAREAN SECTION  1966   COLONOSCOPY     CORONARY ARTERY BYPASS GRAFT  1997   CABG X5   EYE SURGERY     2 laser surgeries on left eye with implant   EYE SURGERY Bilateral    cataracts   FEMUR IM NAIL Left 08/28/2018   Procedure: INTRAMEDULLARY (IM) NAIL FEMORAL;  Surgeon: Rod Can, MD;  Location: [facility];  Service: Orthopedics;  Laterality: Left;   FRACTURE SURGERY     HERNIA REPAIR     LAPAROSCOPIC ASSISTED VENTRAL HERNIA REPAIR  09/2008    with incarcerated colon;  Dr. Lucia Gaskins   MOHS SURGERY  06/2016   [place] Left 11/1994   "crushed my knee"; put in a plate & 6 screws"   POLYPECTOMY     REFRACTIVE SURGERY Left 07/30/2015   resection of small bowel carcinoma  06/1998   Dr. March Rummage   SHOULDER ARTHROSCOPY W/ ROTATOR CUFF REPAIR Right 1997   SMALL INTESTINE SURGERY     TONSILLECTOMY  1960s   TOTAL KNEE ARTHROPLASTY WITH HARDWARE REMOVAL Left 12/1994    (infex, hardware removed )  TUBAL LIGATION  1966    Allergies as of 12/14/2020       Reactions   Tramadol    Insomnia    Codeine    REACTION: makes her nervous, orTylenol #3   Ibuprofen    REACTION: nervous   Meperidine Hcl    REACTION: nasuea and vomitting   Naproxen Sodium    REACTION: nervous        Medication List        Accurate as of December 14, 2020  9:08 PM. If you have any questions, ask your nurse or doctor.          amLODipine 5 MG tablet Commonly known as: NORVASC TAKE 1 TABLET(5 MG) BY MOUTH DAILY   aspirin 81 MG chewable tablet Chew 1 tablet (81 mg total) by mouth 2 (two) times daily with a meal. What changed: when to take this   atenolol 100 MG tablet Commonly known as: TENORMIN TAKE 1 TABLET(100 MG) BY MOUTH DAILY   atorvastatin 20 MG tablet Commonly known as: LIPITOR TAKE 1 TABLET(20 MG) BY MOUTH DAILY    azelastine 0.1 % nasal spray Commonly known as: ASTELIN Place 2 sprays into both nostrils at bedtime as needed for rhinitis or allergies. Use in each nostril as directed   Calquence 100 MG Tabs Generic drug: Acalabrutinib Maleate Take 100 mg by mouth daily.   cholecalciferol 1000 units tablet Commonly known as: VITAMIN D Take 1,000 Units by mouth daily.   clopidogrel 75 MG tablet Commonly known as: PLAVIX Take 1 tablet (75 mg total) by mouth daily.   clorazepate 7.5 MG tablet Commonly known as: TRANXENE Take 1 tablet (7.5 mg total) by mouth as needed.   fluticasone 50 MCG/ACT nasal spray Commonly known as: FLONASE SHAKE LIQUID AND USE 2 SPRAYS IN EACH NOSTRIL DAILY What changed: See the new instructions.   insulin NPH Human 100 UNIT/ML injection Commonly known as: NOVOLIN N Inject 38 units into the skin every morning and inject 24 units into the skin at bedtime.   insulin regular 100 units/mL injection Commonly known as: NOVOLIN R Inject 12 Units into the skin 2 (two) times daily before a meal.   INSULIN SYRINGE .5CC/31GX5/16" 31G X 5/16" 0.5 ML Misc USE TO INJECT INSULIN 5 TIMES PER DAY   irbesartan 300 MG tablet Commonly known as: AVAPRO TAKE 1/2 TABLET(150 MG) BY MOUTH DAILY   isosorbide mononitrate 30 MG 24 hr tablet Commonly known as: IMDUR TAKE 1 TABLET BY MOUTH EVERY DAY   metFORMIN 500 MG tablet Commonly known as: GLUCOPHAGE TAKE 1 TABLET(500 MG) BY MOUTH DAILY WITH SUPPER   mometasone 0.1 % cream Commonly known as: ELOCON Apply 1 application topically daily. Use as directed   multivitamin capsule Take 1 capsule by mouth daily.   nitroGLYCERIN 0.3 MG SL tablet Commonly known as: Nitrostat Place 1 tablet (0.3 mg total) under the tongue every 5 (five) minutes as needed for chest pain (ER if no better after 3 tablets).   omeprazole 20 MG capsule Commonly known as: PRILOSEC TAKE 1 CAPSULE(20 MG) BY MOUTH DAILY   ONE TOUCH ULTRA SYSTEM KIT w/Device  Kit 1 kit by Does not apply route once.   OneTouch Ultra test strip Generic drug: glucose blood USE TO TEST BLOOD SUGAR 3 TIMES DAILY Dx code E11.65   prochlorperazine 10 MG tablet Commonly known as: COMPAZINE TAKE 1 TABLET(10 MG) BY MOUTH EVERY 6 HOURS AS NEEDED FOR NAUSEA OR VOMITING   Victoza 18 MG/3ML Sopn Generic  drug: liraglutide ADMINISTER 1.2 MG UNDER THE SKIN DAILY           Objective:   Physical Exam BP 134/78 (BP Location: Left Arm, Patient Position: Sitting, Cuff Size: Small)    Pulse 68    Temp 98 F (36.7 C) (Oral)    Resp 18    Ht 5' 6"  (1.676 m)    Wt 177 lb 4 oz (80.4 kg)    SpO2 98%    BMI 28.61 kg/m  General:   Well developed, NAD, BMI noted. HEENT:  Normocephalic . Face symmetric, atraumatic Lungs:  CTA B Normal respiratory effort, no intercostal retractions, no accessory muscle use. Heart: RRR,  no murmur.  Lower extremities: no pretibial edema bilaterally  Skin: Not pale. Not jaundice Neurologic:  alert & oriented X3.  Speech normal, gait appropriate for age and unassisted Psych--  Cognition and judgment appear intact.  Cooperative with normal attention span and concentration.  Behavior appropriate. No anxious or depressed appearing.      Assessment     Assessment DM  Dr Dwyane Dee  +retinopathy  HTN Hyperlipidemia CKD: creat ~1.4  (GFR ~ 38) [place] 09-2019: No obstruction, bilateral cortical thinning and a small renal cysts Depression/anxiety: tranxene qhs prn (takes rarely) MSK: -DJD  - Osteoporosis ---T score -0.8 on December 2017, h/o a foot FX d/t walking years ago --- (L) Hip Fx, 08/2018: started fosamax, switch to Prolia d/t CKD and diff swallowing, first dose 12-2019 Hem/Onc: Dr Marin Olp  -CLL -iron deficiency anemia: IV iron 2018 -B12 def CAD, Dr Gwenlyn Found MI 97 >> CABG, cath 12-13-2006, myoview 2012 no ischemic CP: Stress test 08/05/2015, indeterminate risk study, + lateral ischemia: Cardiac catheterization 09/16/2015: Rx  medical Venous insuff GI:  Dr Silverio Decamp ---GERD, IBS, h/o Gastritis ---H/o ulcerative colitis as a child H/o shingles  BCC Dr Allyson Sabal, bx 04-2015, MOH's 06-2016   PLAN DM: Saw Endo 09/16/2020, A1c at the time was 5.9 CLL: Last visit with hematology 11/18/2020, they noted good prognostic markers, on Rituxan Osteoporosis: Prolia today. DOE: Complains of some DOE only with overexertion, she is able to walk x 30 minutes or work her  yard before she gets problems.  Denies chest pain or palpitation.  Most recent cardiac evaluation May 2021 with echo and Myoview >>>  all okay.  Recommend observation. CAD: See above, doubt DOE is related to heart disease.  Likely multifactorial. Preventive care extensively discussed. RTC 6 months    This visit occurred during the SARS-CoV-2 public health emergency.  Safety protocols were in place, including screening questions prior to the visit, additional usage of staff PPE, and extensive cleaning of exam room while observing appropriate contact time as indicated for disinfecting solutions.

## 2020-12-14 NOTE — Assessment & Plan Note (Signed)
--  Tdap--09/01/10.  Recommend booster -PNA 23: 2010 and 04-2017; prevnar--2015 - Shingles--03/19/13 -shingrix discussed -PAP: no further screening is recommended  -MMG 12-02-20 per [place] -[facility]--cscope 2011, again 11-2014: + Polyps, cscope 10-26-2017, next per GI -ACP package of information provided

## 2020-12-14 NOTE — Assessment & Plan Note (Signed)
DM: Saw Endo 09/16/2020, A1c at the time was 5.9 CLL: Last visit with hematology 11/18/2020, they noted good prognostic markers, on Rituxan Osteoporosis: Prolia today. DOE: Complains of some DOE only with overexertion, she is able to walk x 30 minutes or work her  yard before she gets problems.  Denies chest pain or palpitation.  Most recent cardiac evaluation May 2021 with echo and Myoview >>>  all okay.  Recommend observation. CAD: See above, doubt DOE is related to heart disease.  Likely multifactorial. Preventive care extensively discussed. RTC 6 months

## 2020-12-15 ENCOUNTER — Telehealth: Payer: Self-pay

## 2020-12-15 NOTE — Telephone Encounter (Signed)
-----   Message from [place], [place] sent at 12/14/2020  2:34 PM EST ----- Regarding: Prolia Pt received Prolia today, 12/14/20.

## 2020-12-16 ENCOUNTER — Other Ambulatory Visit: Payer: Self-pay

## 2020-12-16 ENCOUNTER — Ambulatory Visit (INDEPENDENT_AMBULATORY_CARE_PROVIDER_SITE_OTHER): Payer: Medicare Other | Admitting: Endocrinology

## 2020-12-16 VITALS — BP 158/80 | HR 68 | Ht 66.0 in | Wt 176.0 lb

## 2020-12-16 DIAGNOSIS — I25118 Atherosclerotic heart disease of native coronary artery with other forms of angina pectoris: Secondary | ICD-10-CM | POA: Diagnosis not present

## 2020-12-16 DIAGNOSIS — Z794 Long term (current) use of insulin: Secondary | ICD-10-CM

## 2020-12-16 DIAGNOSIS — E1165 Type 2 diabetes mellitus with hyperglycemia: Secondary | ICD-10-CM

## 2020-12-16 DIAGNOSIS — I10 Essential (primary) hypertension: Secondary | ICD-10-CM | POA: Diagnosis not present

## 2020-12-16 NOTE — Progress Notes (Signed)
Patient ID: [name], female   DOB: 1940/03/12, 80 y.o.   MRN: [id number]            Reason for Appointment: Followup for Type 2 Diabetes   History of Present Illness:          Diagnosis: Type 2 diabetes mellitus, date of diagnosis: 1998       Past history:   She was treated with metformin at diagnosis when this had been continued until 2015 Subsequently Amaryl was also added several years ago and this has been continued Her blood sugars were under fair control between 2010 and early 2013 with A1c ranging from 7.4-9.4, mostly under 8% However since 08/2011 her A1c has been mostly over 8%  Insulin was added in 2014 with sma limited bottom ll doses of Lantus and this has been progressively increased She was started on mealtime insulin on her initial consultation in 9/15 because of high postprandial readings, sometimes over 300 With adding Victoza in 02/2014 her blood sugars were somewhat better with A1c coming down below 8%  Recent history: 36 NPH and 12 regular instead of 38 NPH and 15 regular in the mornings  INSULIN regimen: Regular 12 units a.m. and 14 units before dinner.  NPH 36 units in the morning and 24 hs  Non-insulin hypoglycemic drugs the patient is taking are: Victoza 1.2 mg daily.    Current blood sugar patterns, daily management and problems identified:  Her A1c is likely falsely low at 5.9, previously was 6.7.  Her, previous range up to 8%  She was told to reduce her morning regular insulin to 12 units on the last 3 visits but she felt her sugars were running higher in the evenings and she increased her dose to 15 units on her own However her blood sugar in the lab in the afternoon was only 43 with mild symptoms and she is back on 12  Despite reminders she only checks her blood sugars before a meal in the morning and about 2 hours after dinner and these are all variable times  Blood sugar was 365 on the evening she had steroid injection with her chemotherapy  infusion However she did not understand the connection between the Solu-Medrol and her glucose, also the next morning blood sugar was higher at 186  She is mostly eating yogurt in the morning and not a large meal Fasting readings are excellent most of the time without hypoglycemia However blood sugars after dinner are variable based on her diet      Dinner is usually at 6-7 pm Bfst 10 am  Glucose monitoring:  done 1-2 times a day         Glucometer: One Touch ultra 2.       Blood Glucose readings by time of day from download for the last 2 weeks:   PRE-MEAL Fasting Lunch Dinner Bedtime Overall  Glucose range: 76-186      Mean/median: 122    139   POST-MEAL PC Breakfast PC Lunch PC Dinner  Glucose range:   97-365  Mean/median:   170   Prior  PRE-MEAL Fasting Lunch Dinner Bedtime Overall  Glucose range: 77-179    77-283  Mean/median: 120/120    136   POST-MEAL PC Breakfast PC Lunch PC Dinner  Glucose range:   98-283  Mean/median:   150    Self-care: The diet that the patient has been following is: none, usually eating low fat meals Meals: 2-3 meals per day  Dietician visit, most recent: never.  She saw the CDE in 02/2014               Weight history:  Wt Readings from Last 3 Encounters:  12/16/20 176 lb (79.8 kg)  12/14/20 177 lb 4 oz (80.4 kg)  11/18/20 178 lb 1.9 oz (80.8 kg)    Glycemic control:   Lab Results  Component Value Date   HGBA1C 5.9 12/09/2020   HGBA1C 6.7 (H) 09/14/2020   HGBA1C 7.0 (H) 05/05/2020   Lab Results  Component Value Date   MICROALBUR 9.5 (H) 12/09/2020   LDLCALC 41 05/05/2020   CREATININE 1.38 (H) 12/09/2020     OTHER active problems  discussed in review of systems   Allergies as of 12/16/2020       Reactions   Tramadol    Insomnia    Codeine    REACTION: makes her nervous, orTylenol #3   Ibuprofen    REACTION: nervous   Meperidine Hcl    REACTION: nasuea and vomitting   Naproxen Sodium    REACTION: nervous         Medication List        Accurate as of December 16, 2020  2:49 PM. If you have any questions, ask your nurse or doctor.          amLODipine 5 MG tablet Commonly known as: NORVASC TAKE 1 TABLET(5 MG) BY MOUTH DAILY   aspirin 81 MG chewable tablet Chew 1 tablet (81 mg total) by mouth 2 (two) times daily with a meal. What changed: when to take this   atenolol 100 MG tablet Commonly known as: TENORMIN TAKE 1 TABLET(100 MG) BY MOUTH DAILY   atorvastatin 20 MG tablet Commonly known as: LIPITOR TAKE 1 TABLET(20 MG) BY MOUTH DAILY   azelastine 0.1 % nasal spray Commonly known as: ASTELIN Place 2 sprays into both nostrils at bedtime as needed for rhinitis or allergies. Use in each nostril as directed   Calquence 100 MG Tabs Generic drug: Acalabrutinib Maleate Take 100 mg by mouth daily.   cholecalciferol 1000 units tablet Commonly known as: VITAMIN D Take 1,000 Units by mouth daily.   clopidogrel 75 MG tablet Commonly known as: PLAVIX Take 1 tablet (75 mg total) by mouth daily.   clorazepate 7.5 MG tablet Commonly known as: TRANXENE Take 1 tablet (7.5 mg total) by mouth as needed.   fluticasone 50 MCG/ACT nasal spray Commonly known as: FLONASE SHAKE LIQUID AND USE 2 SPRAYS IN EACH NOSTRIL DAILY What changed: See the new instructions.   insulin NPH Human 100 UNIT/ML injection Commonly known as: NOVOLIN N Inject 36 units into the skin every morning and inject 24 units into the skin at bedtime.   insulin regular 100 units/mL injection Commonly known as: NOVOLIN R Inject 12 Units into the skin 2 (two) times daily before a meal.   INSULIN SYRINGE .5CC/31GX5/16" 31G X 5/16" 0.5 ML Misc USE TO INJECT INSULIN 5 TIMES PER DAY   irbesartan 300 MG tablet Commonly known as: AVAPRO TAKE 1/2 TABLET(150 MG) BY MOUTH DAILY   isosorbide mononitrate 30 MG 24 hr tablet Commonly known as: IMDUR TAKE 1 TABLET BY MOUTH EVERY DAY   metFORMIN 500 MG tablet Commonly  known as: GLUCOPHAGE TAKE 1 TABLET(500 MG) BY MOUTH DAILY WITH SUPPER   mometasone 0.1 % cream Commonly known as: ELOCON Apply 1 application topically daily. Use as directed   multivitamin capsule Take 1 capsule by mouth daily.   nitroGLYCERIN  0.3 MG SL tablet Commonly known as: Nitrostat Place 1 tablet (0.3 mg total) under the tongue every 5 (five) minutes as needed for chest pain (ER if no better after 3 tablets).   omeprazole 20 MG capsule Commonly known as: PRILOSEC TAKE 1 CAPSULE(20 MG) BY MOUTH DAILY   ONE TOUCH ULTRA SYSTEM KIT w/Device Kit 1 kit by Does not apply route once.   OneTouch Ultra test strip Generic drug: glucose blood USE TO TEST BLOOD SUGAR 3 TIMES DAILY Dx code E11.65   prochlorperazine 10 MG tablet Commonly known as: COMPAZINE TAKE 1 TABLET(10 MG) BY MOUTH EVERY 6 HOURS AS NEEDED FOR NAUSEA OR VOMITING   Victoza 18 MG/3ML Sopn Generic drug: liraglutide ADMINISTER 1.2 MG UNDER THE SKIN DAILY        Allergies:  Allergies  Allergen Reactions   Tramadol     Insomnia    Codeine     REACTION: makes her nervous, orTylenol #3   Ibuprofen     REACTION: nervous   Meperidine Hcl     REACTION: nasuea and vomitting   Naproxen Sodium     REACTION: nervous    Past Medical History:  Diagnosis Date   Acute blood loss anemia 09/17/2015   Allergy    Anemia    Anginal pain ([place]) 2017   Anxiety    Arthritis    "hands and knees mostly" (09/16/2015)   B12 deficiency anemia    CAD (coronary artery disease)    Dr Gwenlyn Found   Cataract    bil cataracts removed   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   Clotting disorder ([place])    Depression    DJD (degenerative joint disease)    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   Gastritis    GERD (gastroesophageal reflux disease)    Headache(784.0)    History of blood transfusion    "I"ve had 20 some; thru birth of children, loss of blood, last 2 were in ~ 1999 before my cancer surgery"  (09/16/2015)   History of hiatal hernia    History of shingles    Hx of colonic polyps    Hyperlipidemia    Hypertension    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   Myocardial infarction ([place])    1997   Pneumonia    X 1   Postoperative hematoma involving circulatory system following cardiac catheterization 09/17/2015   S/P cardiac cath 09/16/15 09/17/2015   Type II diabetes mellitus ([place])    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[facility])    as a child   Venous insufficiency     Past Surgical History:  Procedure Laterality Date   [place]; 2008; 09/16/2015   CARDIAC CATHETERIZATION N/A 09/16/2015   Procedure: Right/Left Heart Cath and Coronary/Graft Angiography;  Surgeon: Lorretta Harp, MD;  Location: [facility];  Service: Cardiovascular;  Laterality: N/A;   CESAREAN SECTION  1966   COLONOSCOPY     CORONARY ARTERY BYPASS GRAFT  1997   CABG X5   EYE SURGERY     2 laser surgeries on left eye with implant   EYE SURGERY Bilateral    cataracts   FEMUR IM NAIL Left 08/28/2018   Procedure: INTRAMEDULLARY (IM) NAIL FEMORAL;  Surgeon: Rod Can, MD;  Location: [facility];  Service: Orthopedics;  Laterality: Left;   FRACTURE SURGERY     HERNIA REPAIR     LAPAROSCOPIC ASSISTED VENTRAL HERNIA REPAIR  09/2008  with incarcerated colon;  Dr. Lucia Gaskins   MOHS SURGERY  06/2016   [place] Left 11/1994   "crushed my knee"; put in a plate & 6 screws"   POLYPECTOMY     REFRACTIVE SURGERY Left 07/30/2015   resection of small bowel carcinoma  06/1998   Dr. March Rummage   SHOULDER ARTHROSCOPY W/ ROTATOR CUFF REPAIR Right [place] Left 12/1994    (infex, hardware removed )   TUBAL LIGATION  1966    Family History  Problem Relation Age of Onset   Heart disease Mother 42   Heart disease Father 54       MI    Hypertension Child    Heart attack Child    Diabetes Child    Heart disease Maternal Aunt        x 2, all deceased   Heart disease Maternal Uncle        x 4, all deceased   Emphysema Brother 73   Colon cancer Neg Hx    Breast cancer Neg Hx    Rectal cancer Neg Hx    Stomach cancer Neg Hx     Social History:  reports that she has never smoked. She has never used smokeless tobacco. She reports that she does not drink alcohol and does not use drugs.    Review of Systems         Lipids: Has had excellent control of hypercholesterolemia with long-term use of Lipitor She has a history of coronary bypass surgery.  Last labs as follows:       Lab Results  Component Value Date   CHOL 101 05/05/2020   HDL 28.90 (L) 05/05/2020   LDLCALC 41 05/05/2020   LDLDIRECT 36.0 05/26/2016   TRIG 156.0 (H) 05/05/2020   CHOLHDL 3 05/05/2020                  The blood pressure has been treated with atenolol 100 mg by her PCP and also 5 mg amlodipine prescribed here  Has home BP meter also but has not checked it lately Blood pressure is higher than usual today  BP Readings from Last 3 Encounters:  12/16/20 (!) 158/80  12/14/20 134/78  11/18/20 (!) 134/54     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 creatinine has been fairly stable lately  She does have a high microalbumin level in the past Normal in 12/22    Lab Results  Component Value Date   CREATININE 1.38 (H) 12/09/2020   CREATININE 1.35 (H) 11/18/2020   CREATININE 1.26 (H) 10/21/2020       No history of Numbness, tingling or burning in feet   Diabetic foot exam was in 5/19 showing mild neuropathy  History of CLL: Under the care of hematologist, now is going for chemotherapy  Lab Results  Component Value Date   WBC 14.5 (H) 11/18/2020   Lab Results  Component Value Date   HGB 11.0 (L) 11/18/2020       Physical Examination:  BP (!) 158/80    Pulse 68    Ht _0  (1.676 m)    Wt 176 lb (79.8 kg)    SpO2 97%    BMI 28.41 kg/m      Diabetes type 2, insulin requiring    See history of present illness for detailed discussion of her current blood sugar patterns, management and problems  identified  Her A1c is improved at 5.9 compared to 6.7 This is likely falsely low because of anemia or other factors  She has been on regular and NPH insulin twice a day along with metformin and Victoza  She has fairly good blood sugars at home although variable in the evenings after dinner based on her diet However overall considering her age and comorbid conditions her level of control is excellent Currently main difficulty is avoiding hypoglycemia This appears to be occurring mostly in the afternoons with her lab glucose of 43 She is not understanding the peak actions of regular and NPH insulin is and how to adjust the  Also with her chemotherapy infusion she is getting Solu-Medrol which causes her sugar to be significantly high and she is not monitoring this or treating it   RENAL insufficiency: Her creatinine is variable but relatively stable  Currently not on diuretics Likely from nephrosclerosis  HYPERTENSION: Blood pressure is well controlled although higher today, may be related to stress of driving and Microalbumin again normal  She will continue to follow-up with her PCP regarding this    PLAN:  May consider reducing bedtime NPH if morning sugars start getting lower For now we will continue the same doses of insulin Discussed what to use for treatment of low sugars  Again given written instructions to not check blood sugars every morning but alternate with after breakfast monitoring as well as some before dinner while continuing some readings after evening meal  Discussed blood sugar targets at various times Also discussed in detail the potential use of freestyle [place] system for monitoring her blood sugars more closely and conveniently, discussed how this works, Building surveyor of DME suppliers given for her to call.  She  can have [place] instruct her on starting this if needed She will need to take additional 4 units at least of regular insulin at suppertime on the day she goes for chemotherapy infusion based on her blood sugar level Continue to take 500 mg metformin at dinnertime Encourage her to be as active as possible   Total visit time including counseling = 30 minutes  Patient Instructions  Check blood sugars on waking up 3 days a week  Also check blood sugars about 2 hours after meals and do this after different meals by rotation  Recommended blood sugar levels on waking up are 90-130 and about 2 hours after meal is 130-180  Please bring your blood sugar monitor to each visit, thank you  On infusion days take 18 R instead of 14 at supper   Call re: [facility] sensor    Elayne Snare 12/16/2020, 2:49 PM   Note: This office note was prepared with Dragon voice recognition system technology. Any transcriptional errors that result from this process are unintentional.

## 2020-12-16 NOTE — Patient Instructions (Addendum)
Check blood sugars on waking up 3 days a week  Also check blood sugars about 2 hours after meals and do this after different meals by rotation  Recommended blood sugar levels on waking up are 90-130 and about 2 hours after meal is 130-180  Please bring your blood sugar monitor to each visit, thank you  On infusion days take 18 R instead of 14 at supper   Call re: Elenor Legato sensor

## 2020-12-20 ENCOUNTER — Other Ambulatory Visit (HOSPITAL_COMMUNITY): Payer: Self-pay

## 2020-12-20 ENCOUNTER — Inpatient Hospital Stay: Payer: Medicare Other

## 2020-12-20 ENCOUNTER — Telehealth: Payer: Self-pay | Admitting: *Deleted

## 2020-12-20 ENCOUNTER — Other Ambulatory Visit: Payer: Self-pay

## 2020-12-20 ENCOUNTER — Encounter: Payer: Self-pay | Admitting: Hematology & Oncology

## 2020-12-20 ENCOUNTER — Inpatient Hospital Stay (HOSPITAL_BASED_OUTPATIENT_CLINIC_OR_DEPARTMENT_OTHER): Payer: Medicare Other | Admitting: Hematology & Oncology

## 2020-12-20 ENCOUNTER — Inpatient Hospital Stay: Payer: Medicare Other | Attending: Hematology & Oncology

## 2020-12-20 VITALS — BP 141/64 | HR 73 | Temp 98.1°F | Resp 19

## 2020-12-20 VITALS — BP 141/59 | HR 66 | Temp 98.0°F | Resp 16 | Wt 178.0 lb

## 2020-12-20 DIAGNOSIS — C9110 Chronic lymphocytic leukemia of B-cell type not having achieved remission: Secondary | ICD-10-CM

## 2020-12-20 DIAGNOSIS — Z79899 Other long term (current) drug therapy: Secondary | ICD-10-CM | POA: Diagnosis not present

## 2020-12-20 DIAGNOSIS — Z5112 Encounter for antineoplastic immunotherapy: Secondary | ICD-10-CM | POA: Insufficient documentation

## 2020-12-20 DIAGNOSIS — Z85038 Personal history of other malignant neoplasm of large intestine: Secondary | ICD-10-CM | POA: Insufficient documentation

## 2020-12-20 DIAGNOSIS — D509 Iron deficiency anemia, unspecified: Secondary | ICD-10-CM | POA: Insufficient documentation

## 2020-12-20 DIAGNOSIS — Z7982 Long term (current) use of aspirin: Secondary | ICD-10-CM | POA: Diagnosis not present

## 2020-12-20 LAB — CBC WITH DIFFERENTIAL (CANCER CENTER ONLY)
Abs Immature Granulocytes: 0.05 10*3/uL (ref 0.00–0.07)
Basophils Absolute: 0 10*3/uL (ref 0.0–0.1)
Basophils Relative: 0 %
Eosinophils Absolute: 0.1 10*3/uL (ref 0.0–0.5)
Eosinophils Relative: 1 %
HCT: 36.3 % (ref 36.0–46.0)
Hemoglobin: 11.6 g/dL — ABNORMAL LOW (ref 12.0–15.0)
Immature Granulocytes: 1 %
Lymphocytes Relative: 57 %
Lymphs Abs: 6.3 10*3/uL — ABNORMAL HIGH (ref 0.7–4.0)
MCH: 31.2 pg (ref 26.0–34.0)
MCHC: 32 g/dL (ref 30.0–36.0)
MCV: 97.6 fL (ref 80.0–100.0)
Monocytes Absolute: 0.8 10*3/uL (ref 0.1–1.0)
Monocytes Relative: 7 %
Neutro Abs: 3.8 10*3/uL (ref 1.7–7.7)
Neutrophils Relative %: 34 %
Platelet Count: 162 10*3/uL (ref 150–400)
RBC: 3.72 MIL/uL — ABNORMAL LOW (ref 3.87–5.11)
RDW: 13.8 % (ref 11.5–15.5)
WBC Count: 11.1 10*3/uL — ABNORMAL HIGH (ref 4.0–10.5)
nRBC: 0 % (ref 0.0–0.2)

## 2020-12-20 LAB — CMP (CANCER CENTER ONLY)
ALT: 11 U/L (ref 0–44)
AST: 16 U/L (ref 15–41)
Albumin: 4.1 g/dL (ref 3.5–5.0)
Alkaline Phosphatase: 75 U/L (ref 38–126)
Anion gap: 9 (ref 5–15)
BUN: 20 mg/dL (ref 8–23)
CO2: 26 mmol/L (ref 22–32)
Calcium: 9.8 mg/dL (ref 8.9–10.3)
Chloride: 109 mmol/L (ref 98–111)
Creatinine: 1.32 mg/dL — ABNORMAL HIGH (ref 0.44–1.00)
GFR, Estimated: 41 mL/min — ABNORMAL LOW (ref >60)
Glucose, Bld: 80 mg/dL (ref 70–99)
Potassium: 4.3 mmol/L (ref 3.5–5.1)
Sodium: 144 mmol/L (ref 135–145)
Total Bilirubin: 0.4 mg/dL (ref 0.3–1.2)
Total Protein: 6.6 g/dL (ref 6.5–8.1)

## 2020-12-20 LAB — LACTATE DEHYDROGENASE: LDH: 160 U/L (ref 98–192)

## 2020-12-20 LAB — SAVE SMEAR(SSMR), FOR PROVIDER SLIDE REVIEW

## 2020-12-20 MED ORDER — SODIUM CHLORIDE 0.9 % IV SOLN
Freq: Once | INTRAVENOUS | Status: AC
Start: 2020-12-20 — End: 2020-12-20

## 2020-12-20 MED ORDER — SODIUM CHLORIDE 0.9 % IV SOLN
40.0000 mg | Freq: Once | INTRAVENOUS | Status: AC
  Administered 2020-12-20: 09:00:00 40 mg via INTRAVENOUS
  Filled 2020-12-20: qty 4

## 2020-12-20 MED ORDER — ACETAMINOPHEN 325 MG PO TABS
650.0000 mg | ORAL_TABLET | Freq: Once | ORAL | Status: AC
  Administered 2020-12-20: 09:00:00 650 mg via ORAL
  Filled 2020-12-20: qty 2

## 2020-12-20 MED ORDER — ONDANSETRON HCL 8 MG PO TABS: 8.0000 mg | ORAL_TABLET | Freq: Three times a day (TID) | ORAL | 3 refills | Status: DC | PRN

## 2020-12-20 MED ORDER — METHYLPREDNISOLONE SODIUM SUCC 125 MG IJ SOLR
80.0000 mg | Freq: Once | INTRAMUSCULAR | Status: AC
  Administered 2020-12-20: 09:00:00 80 mg via INTRAVENOUS
  Filled 2020-12-20: qty 2

## 2020-12-20 MED ORDER — OMEPRAZOLE 20 MG PO CPDR: DELAYED_RELEASE_CAPSULE | ORAL | 3 refills | Status: DC

## 2020-12-20 MED ORDER — SODIUM CHLORIDE 0.9 % IV SOLN
375.0000 mg/m2 | Freq: Once | INTRAVENOUS | Status: AC
  Administered 2020-12-20: 10:00:00 700 mg via INTRAVENOUS
  Filled 2020-12-20: qty 50

## 2020-12-20 MED ORDER — DIPHENHYDRAMINE HCL 25 MG PO CAPS
50.0000 mg | ORAL_CAPSULE | Freq: Once | ORAL | Status: AC
  Administered 2020-12-20: 09:00:00 50 mg via ORAL
  Filled 2020-12-20: qty 2

## 2020-12-20 NOTE — Progress Notes (Signed)
Hematology and Oncology Follow Up Visit  [place] [id number] Jul 17, 1940 80 y.o. 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  She did have a very nice Thanksgiving.  She is with her family.  She will be with her family for Christmas.  She is doing pretty well.  She was had no specific complaints although she does feel chilled.  This been going on for quite a while.  This started before she was started any treatments.  She also has some problems with nausea.  She was on Compazine at home.  This however was not really working for her.  I will see about calling in some Zofran for her.  She did have an episode of bright red blood per rectum right before Thanksgiving.  I am unsure exactly where this came from.  She has had a history of polyps in the past.  She has seen gastroenterology.  I think she may need to go back to see them and see if she needs to have another colonoscopy.  I will know she may have diverticulosis.  She, otherwise, seems to be doing well with the acalabrutinib.  She has had no cough.'s been no fever.  She has had no rashes.  There has been no leg swelling.  At some point, I would like to add venetoclax.  I think we can try to do this after she finishes the Rituxan.  Currently, her performance status is ECOG 1.     Medications:  Allergies as of 12/20/2020       Reactions   Tramadol    Insomnia    Codeine    REACTION: makes her nervous, orTylenol #3   Ibuprofen    REACTION: nervous   Meperidine Hcl    REACTION: nasuea and vomitting   Naproxen Sodium    REACTION: nervous        Medication List        Accurate as of December 20, 2020  8:42 AM. If you have any questions, ask your  nurse or doctor.          amLODipine 5 MG tablet Commonly known as: NORVASC TAKE 1 TABLET(5 MG) BY MOUTH DAILY   aspirin 81 MG chewable tablet Chew 1 tablet (81 mg total) by mouth 2 (two) times daily with a meal. What changed: when to take this   atenolol 100 MG tablet Commonly known as: TENORMIN TAKE 1 TABLET(100 MG) BY MOUTH DAILY   atorvastatin 20 MG tablet Commonly known as: LIPITOR TAKE 1 TABLET(20 MG) BY MOUTH DAILY   azelastine 0.1 % nasal spray Commonly known as: ASTELIN Place 2 sprays into both nostrils at bedtime as needed for rhinitis or allergies. Use in each nostril as directed   Calquence 100 MG Tabs Generic drug: Acalabrutinib Maleate Take 100 mg by mouth daily.   cholecalciferol 1000 units tablet Commonly known as: VITAMIN D Take 1,000 Units by mouth daily.   clopidogrel 75 MG tablet Commonly known as: PLAVIX Take 1 tablet (75 mg total) by mouth daily.   clorazepate 7.5 MG tablet Commonly known as: TRANXENE Take 1 tablet (7.5 mg total) by mouth as needed.   fluticasone 50 MCG/ACT nasal spray Commonly known as: FLONASE SHAKE LIQUID AND USE  2 SPRAYS IN EACH NOSTRIL DAILY What changed: See the new instructions.   insulin NPH Human 100 UNIT/ML injection Commonly known as: NOVOLIN N Inject 36 units into the skin every morning and inject 24 units into the skin at bedtime.   insulin regular 100 units/mL injection Commonly known as: NOVOLIN R Inject 12 Units into the skin 2 (two) times daily before a meal.   INSULIN SYRINGE .5CC/31GX5/16" 31G X 5/16" 0.5 ML Misc USE TO INJECT INSULIN 5 TIMES PER DAY   irbesartan 300 MG tablet Commonly known as: AVAPRO TAKE 1/2 TABLET(150 MG) BY MOUTH DAILY   isosorbide mononitrate 30 MG 24 hr tablet Commonly known as: IMDUR TAKE 1 TABLET BY MOUTH EVERY DAY   metFORMIN 500 MG tablet Commonly known as: GLUCOPHAGE TAKE 1 TABLET(500 MG) BY MOUTH DAILY WITH SUPPER   mometasone 0.1 % cream Commonly known as:  ELOCON Apply 1 application topically daily. Use as directed   multivitamin capsule Take 1 capsule by mouth daily.   nitroGLYCERIN 0.3 MG SL tablet Commonly known as: Nitrostat Place 1 tablet (0.3 mg total) under the tongue every 5 (five) minutes as needed for chest pain (ER if no better after 3 tablets).   omeprazole 20 MG capsule Commonly known as: PRILOSEC TAKE 1 CAPSULE(20 MG) BY MOUTH DAILY   ONE TOUCH ULTRA SYSTEM KIT w/Device Kit 1 kit by Does not apply route once.   OneTouch Ultra test strip Generic drug: glucose blood USE TO TEST BLOOD SUGAR 3 TIMES DAILY Dx code E11.65   prochlorperazine 10 MG tablet Commonly known as: COMPAZINE TAKE 1 TABLET(10 MG) BY MOUTH EVERY 6 HOURS AS NEEDED FOR NAUSEA OR VOMITING   Victoza 18 MG/3ML Sopn Generic drug: liraglutide ADMINISTER 1.2 MG UNDER THE SKIN DAILY        Allergies:  Allergies  Allergen Reactions   Tramadol     Insomnia    Codeine     REACTION: makes her nervous, orTylenol #3   Ibuprofen     REACTION: nervous   Meperidine Hcl     REACTION: nasuea and vomitting   Naproxen Sodium     REACTION: nervous    Past Medical History, Surgical history, Social history, and Family History were reviewed and updated.  Review of Systems: Review of Systems  Constitutional: Negative.   HENT: Negative.    Eyes: Negative.   Respiratory: Negative.    Cardiovascular: Negative.   Gastrointestinal: Negative.   Genitourinary: Negative.   Musculoskeletal: Negative.   Skin: Negative.   Neurological: Negative.   Endo/Heme/Allergies: Negative.   Psychiatric/Behavioral: Negative.      Physical Exam:  weight is 178 lb (80.7 kg). Her oral temperature is 98 F (36.7 C). Her blood pressure is 141/59 (abnormal) and her pulse is 66. Her respiration is 16 and oxygen saturation is 100%.   Wt Readings from Last 3 Encounters:  12/20/20 178 lb (80.7 kg)  12/16/20 176 lb (79.8 kg)  12/14/20 177 lb 4 oz (80.4 kg)    Physical  Exam Vitals reviewed.  HENT:     Head: Normocephalic and atraumatic.  Eyes:     Pupils: Pupils are equal, round, and reactive to light.  Cardiovascular:     Rate and Rhythm: Normal rate and regular rhythm.     Heart sounds: Normal heart sounds.  Pulmonary:     Effort: Pulmonary effort is normal.     Breath sounds: Normal breath sounds.  Abdominal:     General: Bowel sounds are normal.  Palpations: Abdomen is soft.  Musculoskeletal:        General: No tenderness or deformity. Normal range of motion.     Cervical back: Normal range of motion.  Lymphadenopathy:     Cervical: No cervical adenopathy.  Skin:    General: Skin is warm and dry.     Findings: No erythema or rash.  Neurological:     Mental Status: She is alert and oriented to person, place, and time.  Psychiatric:        Behavior: Behavior normal.        Thought Content: Thought content normal.        Judgment: Judgment normal.     Lab Results  Component Value Date   WBC 11.1 (H) 12/20/2020   HGB 11.6 (L) 12/20/2020   HCT 36.3 12/20/2020   MCV 97.6 12/20/2020   PLT 162 12/20/2020   Lab Results  Component Value Date   FERRITIN 90 08/24/2020   IRON 81 08/24/2020   TIBC 310 08/24/2020   UIBC 229 08/24/2020   IRONPCTSAT 26 08/24/2020   Lab Results  Component Value Date   RETICCTPCT 1.9 08/24/2020   RBC 3.72 (L) 12/20/2020   Lab Results  Component Value Date   KPAFRELGTCHN 40.8 (H) 11/18/2020   LAMBDASER 29.3 (H) 11/18/2020   KAPLAMBRATIO 1.39 11/18/2020   Lab Results  Component Value Date   IGGSERUM 723 11/18/2020   IGA 163 11/18/2020   IGMSERUM 29 11/18/2020   Lab Results  Component Value Date   TOTALPROTELP 6.5 11/18/2020   ALBUMINELP 3.6 11/18/2020   A1GS 0.3 11/18/2020   A2GS 0.9 11/18/2020   BETS 1.0 11/18/2020   BETA2SER 6.2 02/25/2014   GAMS 0.7 11/18/2020   MSPIKE Not Observed 11/18/2020   SPEI * 02/25/2014     Chemistry      Component Value Date/Time   NA 144 12/20/2020  0756   NA 147 (H) 11/03/2016 1018   NA 143 08/30/2016 0954   K 4.3 12/20/2020 0756   K 3.9 11/03/2016 1018   K 4.5 08/30/2016 0954   CL 109 12/20/2020 0756   CL 110 (H) 11/03/2016 1018   CO2 26 12/20/2020 0756   CO2 27 11/03/2016 1018   CO2 24 08/30/2016 0954   BUN 20 12/20/2020 0756   BUN 25 (H) 11/03/2016 1018   BUN 20.5 08/30/2016 0954   CREATININE 1.32 (H) 12/20/2020 0756   CREATININE 1.6 (H) 11/03/2016 1018   CREATININE 1.4 (H) 08/30/2016 0954      Component Value Date/Time   CALCIUM 9.8 12/20/2020 0756   CALCIUM 10.1 11/03/2016 1018   CALCIUM 9.8 08/30/2016 0954   ALKPHOS 75 12/20/2020 0756   ALKPHOS 112 (H) 11/03/2016 1018   ALKPHOS 113 08/30/2016 0954   AST 16 12/20/2020 0756   AST 23 08/30/2016 0954   ALT 11 12/20/2020 0756   ALT 29 11/03/2016 1018   ALT 20 08/30/2016 0954   BILITOT 0.4 12/20/2020 0756   BILITOT 0.38 08/30/2016 0954      Impression and Plan: [name] is a very pleasant 80 yo caucasian female with CLL stage A.  So far, we have not had any negative prognostic markers.  She does have the 13q-chromosomal abnormality.  This is typically construed as a good prognostic marker.  We will go ahead with the 4th Rituxan.  I would plan for a total of 6 cycles of Rituxan.  She is responding nicely.  Her percentage of lymphocytes keeps coming down.  We  will plan to get her back in another month.    [name], MD 12/19/20228:42 AM

## 2020-12-20 NOTE — Patient Instructions (Addendum)
[facility]  Discharge Instructions: Thank you for choosing [facility] to provide your oncology and hematology care.   If you have a lab appointment with the [facility], please go directly to the [facility] and check in at the registration area.  Wear comfortable clothing and clothing appropriate for easy access to any Portacath or PICC line.   We strive to give you quality time with your provider. You may need to reschedule your appointment if you arrive late (15 or more minutes).  Arriving late affects you and other patients whose appointments are after yours.  Also, if you miss three or more appointments without notifying the office, you may be dismissed from the clinic at the providers discretion.      For prescription refill requests, have your pharmacy contact our office and allow 72 hours for refills to be completed.    Today you received the following chemotherapy and/or immunotherapy agents:Ruxience (Rituximab)    To help prevent nausea and vomiting after your treatment, we encourage you to take your nausea medication as directed.  BELOW ARE SYMPTOMS THAT SHOULD BE REPORTED IMMEDIATELY: *FEVER GREATER THAN 100.4 F (38 C) OR HIGHER *CHILLS OR SWEATING *NAUSEA AND VOMITING THAT IS NOT CONTROLLED WITH YOUR NAUSEA MEDICATION *UNUSUAL SHORTNESS OF BREATH *UNUSUAL BRUISING OR BLEEDING *URINARY PROBLEMS (pain or burning when urinating, or frequent urination) *BOWEL PROBLEMS (unusual diarrhea, constipation, pain near the anus) TENDERNESS IN MOUTH AND THROAT WITH OR WITHOUT PRESENCE OF ULCERS (sore throat, sores in mouth, or a toothache) UNUSUAL RASH, SWELLING OR PAIN  UNUSUAL VAGINAL DISCHARGE OR ITCHING   Items with * indicate a potential emergency and should be followed up as soon as possible or go to the Emergency Department if any problems should occur.  Please show the CHEMOTHERAPY ALERT CARD or IMMUNOTHERAPY ALERT CARD at  check-in to the Emergency Department and triage nurse. Should you have questions after your visit or need to cancel or reschedule your appointment, please contact [facility]  [phone] and follow the prompts.  Office hours are 8:00 a.m. to 4:30 p.m. Monday - Friday. Please note that voicemails left after 4:00 p.m. may not be returned until the following business day.  We are closed weekends and major holidays. You have access to a nurse at all times for urgent questions. Please call the main number to the clinic [phone] and follow the prompts.  For any non-urgent questions, you may also contact your provider using MyChart. We now offer e-Visits for anyone 9 and older to request care online for non-urgent symptoms. For details visit [email or web address].   Also download the MyChart app! Go to the app store, search "MyChart", open the app, select [facility], and log in with your MyChart username and password.  Due to Covid, a mask is required upon entering the hospital/clinic. If you do not have a mask, one will be given to you upon arrival. For doctor visits, patients may have 1 support person aged 62 or older with them. For treatment visits, patients cannot have anyone with them due to current Covid guidelines and our immunocompromised population.

## 2020-12-20 NOTE — Telephone Encounter (Signed)
Per 12/20/20 los -gave upcoming appointment - confirmed

## 2020-12-21 ENCOUNTER — Other Ambulatory Visit (HOSPITAL_COMMUNITY): Payer: Self-pay

## 2020-12-21 ENCOUNTER — Telehealth: Payer: Self-pay | Admitting: Pharmacy Technician

## 2020-12-21 DIAGNOSIS — Z23 Encounter for immunization: Secondary | ICD-10-CM | POA: Diagnosis not present

## 2020-12-21 LAB — IGG, IGA, IGM
IgA: 157 mg/dL (ref 64–422)
IgG (Immunoglobin G), Serum: 764 mg/dL (ref 586–1602)
IgM (Immunoglobulin M), Srm: 23 mg/dL — ABNORMAL LOW (ref 26–217)

## 2020-12-21 NOTE — Telephone Encounter (Signed)
Oral Oncology Patient Advocate Encounter   Received notification from [employer] that the existing prior authorization for Calquence is due for renewal.   Renewal PA submitted on CoverMyMeds Key [id number]  Status is pending   Oral Oncology Clinic will continue to follow.  [name] Patient [place] Phone [phone] Fax [phone] 12/21/2020 10:10 AM

## 2020-12-21 NOTE — Telephone Encounter (Signed)
Oral Oncology Patient Advocate Encounter  Prior Authorization for Calquence has been approved.    PA# [id number] Effective dates: 01/02/21 through 01/01/22  Oral Oncology Clinic will continue to follow.   [place] Patient [place] Phone [phone] Fax [phone] 12/21/2020 10:11 AM

## 2021-01-04 DIAGNOSIS — H43813 Vitreous degeneration, bilateral: Secondary | ICD-10-CM | POA: Diagnosis not present

## 2021-01-04 DIAGNOSIS — E113391 Type 2 diabetes mellitus with moderate nonproliferative diabetic retinopathy without macular edema, right eye: Secondary | ICD-10-CM | POA: Diagnosis not present

## 2021-01-04 DIAGNOSIS — E113312 Type 2 diabetes mellitus with moderate nonproliferative diabetic retinopathy with macular edema, left eye: Secondary | ICD-10-CM | POA: Diagnosis not present

## 2021-01-04 DIAGNOSIS — H43392 Other vitreous opacities, left eye: Secondary | ICD-10-CM | POA: Diagnosis not present

## 2021-01-04 LAB — HM DIABETES EYE EXAM

## 2021-01-13 ENCOUNTER — Other Ambulatory Visit (HOSPITAL_COMMUNITY): Payer: Self-pay

## 2021-01-17 ENCOUNTER — Encounter: Payer: Self-pay | Admitting: Hematology & Oncology

## 2021-01-17 ENCOUNTER — Inpatient Hospital Stay (HOSPITAL_BASED_OUTPATIENT_CLINIC_OR_DEPARTMENT_OTHER): Payer: Medicare Other | Admitting: Hematology & Oncology

## 2021-01-17 ENCOUNTER — Other Ambulatory Visit: Payer: Self-pay

## 2021-01-17 ENCOUNTER — Other Ambulatory Visit (HOSPITAL_COMMUNITY): Payer: Self-pay

## 2021-01-17 ENCOUNTER — Inpatient Hospital Stay: Payer: Medicare Other | Attending: Hematology & Oncology

## 2021-01-17 ENCOUNTER — Inpatient Hospital Stay: Payer: Medicare Other

## 2021-01-17 VITALS — BP 126/55 | HR 68 | Temp 97.8°F | Resp 18 | Ht 66.0 in | Wt 176.1 lb

## 2021-01-17 VITALS — BP 137/58 | HR 68 | Resp 20

## 2021-01-17 DIAGNOSIS — Z85038 Personal history of other malignant neoplasm of large intestine: Secondary | ICD-10-CM | POA: Diagnosis not present

## 2021-01-17 DIAGNOSIS — Z7982 Long term (current) use of aspirin: Secondary | ICD-10-CM | POA: Diagnosis not present

## 2021-01-17 DIAGNOSIS — E119 Type 2 diabetes mellitus without complications: Secondary | ICD-10-CM | POA: Insufficient documentation

## 2021-01-17 DIAGNOSIS — Z794 Long term (current) use of insulin: Secondary | ICD-10-CM | POA: Insufficient documentation

## 2021-01-17 DIAGNOSIS — C9110 Chronic lymphocytic leukemia of B-cell type not having achieved remission: Secondary | ICD-10-CM

## 2021-01-17 DIAGNOSIS — Z5112 Encounter for antineoplastic immunotherapy: Secondary | ICD-10-CM | POA: Insufficient documentation

## 2021-01-17 DIAGNOSIS — D509 Iron deficiency anemia, unspecified: Secondary | ICD-10-CM | POA: Insufficient documentation

## 2021-01-17 DIAGNOSIS — Z79899 Other long term (current) drug therapy: Secondary | ICD-10-CM | POA: Diagnosis not present

## 2021-01-17 LAB — CBC WITH DIFFERENTIAL (CANCER CENTER ONLY)
Abs Immature Granulocytes: 0.05 10*3/uL (ref 0.00–0.07)
Basophils Absolute: 0 10*3/uL (ref 0.0–0.1)
Basophils Relative: 0 %
Eosinophils Absolute: 0.1 10*3/uL (ref 0.0–0.5)
Eosinophils Relative: 2 %
HCT: 36.2 % (ref 36.0–46.0)
Hemoglobin: 11.7 g/dL — ABNORMAL LOW (ref 12.0–15.0)
Immature Granulocytes: 1 %
Lymphocytes Relative: 45 %
Lymphs Abs: 4 10*3/uL (ref 0.7–4.0)
MCH: 31.5 pg (ref 26.0–34.0)
MCHC: 32.3 g/dL (ref 30.0–36.0)
MCV: 97.6 fL (ref 80.0–100.0)
Monocytes Absolute: 0.6 10*3/uL (ref 0.1–1.0)
Monocytes Relative: 7 %
Neutro Abs: 3.9 10*3/uL (ref 1.7–7.7)
Neutrophils Relative %: 45 %
Platelet Count: 165 10*3/uL (ref 150–400)
RBC: 3.71 MIL/uL — ABNORMAL LOW (ref 3.87–5.11)
RDW: 14.1 % (ref 11.5–15.5)
WBC Count: 8.8 10*3/uL (ref 4.0–10.5)
nRBC: 0 % (ref 0.0–0.2)

## 2021-01-17 LAB — CMP (CANCER CENTER ONLY)
ALT: 11 U/L (ref 0–44)
AST: 16 U/L (ref 15–41)
Albumin: 4 g/dL (ref 3.5–5.0)
Alkaline Phosphatase: 80 U/L (ref 38–126)
Anion gap: 7 (ref 5–15)
BUN: 20 mg/dL (ref 8–23)
CO2: 26 mmol/L (ref 22–32)
Calcium: 9.9 mg/dL (ref 8.9–10.3)
Chloride: 111 mmol/L (ref 98–111)
Creatinine: 1.31 mg/dL — ABNORMAL HIGH (ref 0.44–1.00)
GFR, Estimated: 41 mL/min — ABNORMAL LOW (ref >60)
Glucose, Bld: 164 mg/dL — ABNORMAL HIGH (ref 70–99)
Potassium: 4.6 mmol/L (ref 3.5–5.1)
Sodium: 144 mmol/L (ref 135–145)
Total Bilirubin: 0.4 mg/dL (ref 0.3–1.2)
Total Protein: 6.4 g/dL — ABNORMAL LOW (ref 6.5–8.1)

## 2021-01-17 LAB — SAVE SMEAR(SSMR), FOR PROVIDER SLIDE REVIEW

## 2021-01-17 MED ORDER — ACETAMINOPHEN 325 MG PO TABS
650.0000 mg | ORAL_TABLET | Freq: Once | ORAL | Status: AC
  Administered 2021-01-17: 650 mg via ORAL
  Filled 2021-01-17: qty 2

## 2021-01-17 MED ORDER — LORAZEPAM 2 MG/ML IJ SOLN
0.5000 mg | Freq: Once | INTRAMUSCULAR | Status: AC
  Administered 2021-01-17: 0.5 mg via INTRAVENOUS
  Filled 2021-01-17: qty 1

## 2021-01-17 MED ORDER — METHYLPREDNISOLONE SODIUM SUCC 125 MG IJ SOLR
80.0000 mg | Freq: Once | INTRAMUSCULAR | Status: AC
  Administered 2021-01-17: 80 mg via INTRAVENOUS
  Filled 2021-01-17: qty 2

## 2021-01-17 MED ORDER — SODIUM CHLORIDE 0.9 % IV SOLN
40.0000 mg | Freq: Once | INTRAVENOUS | Status: AC
  Administered 2021-01-17: 40 mg via INTRAVENOUS
  Filled 2021-01-17: qty 4

## 2021-01-17 MED ORDER — SODIUM CHLORIDE 0.9 % IV SOLN
375.0000 mg/m2 | Freq: Once | INTRAVENOUS | Status: AC
  Administered 2021-01-17: 700 mg via INTRAVENOUS
  Filled 2021-01-17: qty 50

## 2021-01-17 MED ORDER — DIPHENHYDRAMINE HCL 25 MG PO CAPS
50.0000 mg | ORAL_CAPSULE | Freq: Once | ORAL | Status: AC
  Administered 2021-01-17: 50 mg via ORAL
  Filled 2021-01-17: qty 2

## 2021-01-17 MED ORDER — SODIUM CHLORIDE 0.9 % IV SOLN: Freq: Once | INTRAVENOUS | Status: AC

## 2021-01-17 NOTE — Progress Notes (Signed)
Hematology and Oncology Follow Up Visit  [name] [id number] 02/22/1940 81 y.o. 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  She had a very nice Christmas and New Year's.  She was with her family.  They really had a wonderful time.  Everybody was together.  This made her happy.  She has had no problems with the acalabrutinib.  She has had no palpitations.  She has had no nausea or vomiting.  She did have an episode earlier this morning after having some broccoli cheese soup.  She had diarrhea.  This has improved.  She did not take anything for this.  She has had no issues with fever.  She has had no bleeding.  She has had no rashes.  There is been no leg swelling.  She has diabetes.  She takes insulin for this.  Overall, I would say performance status is ECOG 1.    Medications:  Allergies as of 01/17/2021       Reactions   Codeine Other (See Comments)   REACTION: makes her nervous, orTylenol #3   Ibuprofen Other (See Comments)   REACTION: nervous   Meperidine Hcl Nausea And Vomiting   Naproxen Sodium Other (See Comments)   REACTION: nervous   Tramadol Other (See Comments)   Insomnia         Medication List        Accurate as of January 17, 2021  9:29 AM. If you have any questions, ask your nurse or doctor.          amLODipine 5 MG tablet Commonly known as: NORVASC TAKE 1 TABLET(5 MG) BY MOUTH DAILY   aspirin 81 MG chewable tablet Chew 1 tablet (81 mg total) by mouth 2 (two) times daily with a meal. What changed: when to take this   atenolol 100 MG tablet Commonly known as: TENORMIN TAKE 1 TABLET(100 MG) BY MOUTH DAILY   atorvastatin 20 MG tablet Commonly known as:  LIPITOR TAKE 1 TABLET(20 MG) BY MOUTH DAILY   azelastine 0.1 % nasal spray Commonly known as: ASTELIN Place 2 sprays into both nostrils at bedtime as needed for rhinitis or allergies. Use in each nostril as directed   Calquence 100 MG Tabs Generic drug: Acalabrutinib Maleate Take 100 mg by mouth daily.   cholecalciferol 1000 units tablet Commonly known as: VITAMIN D Take 1,000 Units by mouth daily.   clopidogrel 75 MG tablet Commonly known as: PLAVIX Take 1 tablet (75 mg total) by mouth daily.   clorazepate 7.5 MG tablet Commonly known as: TRANXENE Take 1 tablet (7.5 mg total) by mouth as needed.   fluticasone 50 MCG/ACT nasal spray Commonly known as: FLONASE SHAKE LIQUID AND USE 2 SPRAYS IN EACH NOSTRIL DAILY What changed: See the new instructions.   insulin NPH Human 100 UNIT/ML injection Commonly known as: NOVOLIN N Inject 36 units into the skin every morning and inject 24 units into the skin at bedtime.   insulin regular 100 units/mL injection Commonly known as: NOVOLIN R Inject 12 Units into the skin 2 (two) times daily before a meal.   INSULIN SYRINGE .5CC/31GX5/16" 31G X 5/16" 0.5 ML Misc USE TO INJECT INSULIN 5 TIMES PER DAY  irbesartan 300 MG tablet Commonly known as: AVAPRO TAKE 1/2 TABLET(150 MG) BY MOUTH DAILY   isosorbide mononitrate 30 MG 24 hr tablet Commonly known as: IMDUR TAKE 1 TABLET BY MOUTH EVERY DAY   metFORMIN 500 MG tablet Commonly known as: GLUCOPHAGE TAKE 1 TABLET(500 MG) BY MOUTH DAILY WITH SUPPER   mometasone 0.1 % cream Commonly known as: ELOCON Apply 1 application topically daily. Use as directed   multivitamin capsule Take 1 capsule by mouth daily.   nitroGLYCERIN 0.3 MG SL tablet Commonly known as: Nitrostat Place 1 tablet (0.3 mg total) under the tongue every 5 (five) minutes as needed for chest pain (ER if no better after 3 tablets).   omeprazole 20 MG capsule Commonly known as: PRILOSEC TAKE 1 CAPSULE(20 MG) BY MOUTH  DAILY   ondansetron 8 MG tablet Commonly known as: ZOFRAN Take 1 tablet (8 mg total) by mouth every 8 (eight) hours as needed for nausea or vomiting.   ONE TOUCH ULTRA SYSTEM KIT w/Device Kit 1 kit by Does not apply route once.   OneTouch Ultra test strip Generic drug: glucose blood USE TO TEST BLOOD SUGAR 3 TIMES DAILY Dx code E11.65   Victoza 18 MG/3ML Sopn Generic drug: liraglutide ADMINISTER 1.2 MG UNDER THE SKIN DAILY        Allergies:  Allergies  Allergen Reactions   Codeine Other (See Comments)    REACTION: makes her nervous, orTylenol #3   Ibuprofen Other (See Comments)    REACTION: nervous   Meperidine Hcl Nausea And Vomiting   Naproxen Sodium Other (See Comments)    REACTION: nervous   Tramadol Other (See Comments)    Insomnia     Past Medical History, Surgical history, Social history, and Family History were reviewed and updated.  Review of Systems: Review of Systems  Constitutional: Negative.   HENT: Negative.    Eyes: Negative.   Respiratory: Negative.    Cardiovascular: Negative.   Gastrointestinal: Negative.   Genitourinary: Negative.   Musculoskeletal: Negative.   Skin: Negative.   Neurological: Negative.   Endo/Heme/Allergies: Negative.   Psychiatric/Behavioral: Negative.      Physical Exam:  height is 5' 6" (1.676 m) and weight is 176 lb 1.3 oz (79.9 kg). Her oral temperature is 97.8 F (36.6 C). Her blood pressure is 126/55 (abnormal) and her pulse is 68. Her respiration is 18 and oxygen saturation is 100%.   Wt Readings from Last 3 Encounters:  01/17/21 176 lb 1.3 oz (79.9 kg)  12/20/20 178 lb (80.7 kg)  12/16/20 176 lb (79.8 kg)    Physical Exam Vitals reviewed.  HENT:     Head: Normocephalic and atraumatic.  Eyes:     Pupils: Pupils are equal, round, and reactive to light.  Cardiovascular:     Rate and Rhythm: Normal rate and regular rhythm.     Heart sounds: Normal heart sounds.  Pulmonary:     Effort: Pulmonary effort is  normal.     Breath sounds: Normal breath sounds.  Abdominal:     General: Bowel sounds are normal.     Palpations: Abdomen is soft.  Musculoskeletal:        General: No tenderness or deformity. Normal range of motion.     Cervical back: Normal range of motion.  Lymphadenopathy:     Cervical: No cervical adenopathy.  Skin:    General: Skin is warm and dry.     Findings: No erythema or rash.  Neurological:     Mental Status:  She is alert and oriented to person, place, and time.  Psychiatric:        Behavior: Behavior normal.        Thought Content: Thought content normal.        Judgment: Judgment normal.     Lab Results  Component Value Date   WBC 8.8 01/17/2021   HGB 11.7 (L) 01/17/2021   HCT 36.2 01/17/2021   MCV 97.6 01/17/2021   PLT 165 01/17/2021   Lab Results  Component Value Date   FERRITIN 90 08/24/2020   IRON 81 08/24/2020   TIBC 310 08/24/2020   UIBC 229 08/24/2020   IRONPCTSAT 26 08/24/2020   Lab Results  Component Value Date   RETICCTPCT 1.9 08/24/2020   RBC 3.71 (L) 01/17/2021   Lab Results  Component Value Date   KPAFRELGTCHN 40.8 (H) 11/18/2020   LAMBDASER 29.3 (H) 11/18/2020   KAPLAMBRATIO 1.39 11/18/2020   Lab Results  Component Value Date   IGGSERUM 764 12/20/2020   IGA 157 12/20/2020   IGMSERUM 23 (L) 12/20/2020   Lab Results  Component Value Date   TOTALPROTELP 6.5 11/18/2020   ALBUMINELP 3.6 11/18/2020   A1GS 0.3 11/18/2020   A2GS 0.9 11/18/2020   BETS 1.0 11/18/2020   BETA2SER 6.2 02/25/2014   GAMS 0.7 11/18/2020   MSPIKE Not Observed 11/18/2020   SPEI * 02/25/2014     Chemistry      Component Value Date/Time   NA 144 01/17/2021 0830   NA 147 (H) 11/03/2016 1018   NA 143 08/30/2016 0954   K 4.6 01/17/2021 0830   K 3.9 11/03/2016 1018   K 4.5 08/30/2016 0954   CL 111 01/17/2021 0830   CL 110 (H) 11/03/2016 1018   CO2 26 01/17/2021 0830   CO2 27 11/03/2016 1018   CO2 24 08/30/2016 0954   BUN 20 01/17/2021 0830   BUN  25 (H) 11/03/2016 1018   BUN 20.5 08/30/2016 0954   CREATININE 1.31 (H) 01/17/2021 0830   CREATININE 1.6 (H) 11/03/2016 1018   CREATININE 1.4 (H) 08/30/2016 0954      Component Value Date/Time   CALCIUM 9.9 01/17/2021 0830   CALCIUM 10.1 11/03/2016 1018   CALCIUM 9.8 08/30/2016 0954   ALKPHOS 80 01/17/2021 0830   ALKPHOS 112 (H) 11/03/2016 1018   ALKPHOS 113 08/30/2016 0954   AST 16 01/17/2021 0830   AST 23 08/30/2016 0954   ALT 11 01/17/2021 0830   ALT 29 11/03/2016 1018   ALT 20 08/30/2016 0954   BILITOT 0.4 01/17/2021 0830   BILITOT 0.38 08/30/2016 0954      Impression and Plan: [name] is a very pleasant 81 yo caucasian female with CLL stage A.  So far, we have not had any negative prognostic markers.  She does have the 13q-chromosomal abnormality.  This is typically construed as a good prognostic marker.  She will have her fifth cycle Rituxan today.  We will give her total 6 cycles of Rituxan and then go with acalabrutinib.  I would then likely add venetoclax and try to treat her for 2 years total.  I am just happy that she has responded so nicely.  We will plan to get her back in 1 month.  We will go ahead with the 4th Rituxan.  I would plan for a total of 6 cycles of Rituxan.  She is responding nicely.  Her percentage of lymphocytes keeps coming down.  We will plan to get her back in another  month.    [name], MD 1/16/20239:29 AM

## 2021-01-17 NOTE — Patient Instructions (Signed)
[facility]  Discharge Instructions: ?Thank you for choosing [facility] to provide your oncology and hematology care.  ? ?If you have a lab appointment with the Cancer Center, please go directly to the Cancer Center and check in at the registration area. ? ?Wear comfortable clothing and clothing appropriate for easy access to any Portacath or PICC line.  ? ?We strive to give you quality time with your provider. You may need to reschedule your appointment if you arrive late (15 or more minutes).  Arriving late affects you and other patients whose appointments are after yours.  Also, if you miss three or more appointments without notifying the office, you may be dismissed from the clinic at the provider?s discretion.    ?  ?For prescription refill requests, have your pharmacy contact our office and allow 72 hours for refills to be completed.   ? ?Today you received the following chemotherapy and/or immunotherapy agents Rituxan  ?  ?To help prevent nausea and vomiting after your treatment, we encourage you to take your nausea medication as directed. ? ?BELOW ARE SYMPTOMS THAT SHOULD BE REPORTED IMMEDIATELY: ?*FEVER GREATER THAN 100.4 F (38 ?C) OR HIGHER ?*CHILLS OR SWEATING ?*NAUSEA AND VOMITING THAT IS NOT CONTROLLED WITH YOUR NAUSEA MEDICATION ?*UNUSUAL SHORTNESS OF BREATH ?*UNUSUAL BRUISING OR BLEEDING ?*URINARY PROBLEMS (pain or burning when urinating, or frequent urination) ?*BOWEL PROBLEMS (unusual diarrhea, constipation, pain near the anus) ?TENDERNESS IN MOUTH AND THROAT WITH OR WITHOUT PRESENCE OF ULCERS (sore throat, sores in mouth, or a toothache) ?UNUSUAL RASH, SWELLING OR PAIN  ?UNUSUAL VAGINAL DISCHARGE OR ITCHING  ? ?Items with * indicate a potential emergency and should be followed up as soon as possible or go to the Emergency Department if any problems should occur. ? ?Please show the CHEMOTHERAPY ALERT CARD or IMMUNOTHERAPY ALERT CARD at check-in to the  Emergency Department and triage nurse. ?Should you have questions after your visit or need to cancel or reschedule your appointment, please contact [facility]  [phone] and follow the prompts.  Office hours are 8:00 a.m. to 4:30 p.m. Monday - Friday. Please note that voicemails left after 4:00 p.m. may not be returned until the following business day.  We are closed weekends and major holidays. You have access to a nurse at all times for urgent questions. Please call the main number to the clinic [phone] and follow the prompts. ? ?For any non-urgent questions, you may also contact your provider using MyChart. We now offer e-Visits for anyone 18 and older to request care online for non-urgent symptoms. For details visit [email or web address]. ?  ?Also download the MyChart app! Go to the app store, search "MyChart", open the app, select [facility], and log in with your MyChart username and password. ? ?Due to Covid, a mask is required upon entering the hospital/clinic. If you do not have a mask, one will be given to you upon arrival. For doctor visits, patients may have 1 support person aged 18 or older with them. For treatment visits, patients cannot have anyone with them due to current Covid guidelines and our immunocompromised population.  ?

## 2021-01-18 ENCOUNTER — Other Ambulatory Visit (HOSPITAL_COMMUNITY): Payer: Self-pay

## 2021-01-18 DIAGNOSIS — E113312 Type 2 diabetes mellitus with moderate nonproliferative diabetic retinopathy with macular edema, left eye: Secondary | ICD-10-CM | POA: Diagnosis not present

## 2021-01-25 ENCOUNTER — Other Ambulatory Visit: Payer: Self-pay | Admitting: Endocrinology

## 2021-01-27 ENCOUNTER — Other Ambulatory Visit: Payer: Self-pay | Admitting: Internal Medicine

## 2021-02-08 ENCOUNTER — Other Ambulatory Visit (HOSPITAL_COMMUNITY): Payer: Self-pay

## 2021-02-10 ENCOUNTER — Other Ambulatory Visit: Payer: Self-pay | Admitting: Cardiovascular Disease

## 2021-02-10 ENCOUNTER — Other Ambulatory Visit (HOSPITAL_COMMUNITY): Payer: Self-pay

## 2021-02-14 ENCOUNTER — Other Ambulatory Visit (HOSPITAL_COMMUNITY): Payer: Self-pay

## 2021-02-15 ENCOUNTER — Encounter: Payer: Self-pay | Admitting: Internal Medicine

## 2021-02-16 ENCOUNTER — Encounter: Payer: Self-pay | Admitting: Hematology & Oncology

## 2021-02-16 ENCOUNTER — Inpatient Hospital Stay: Payer: Medicare Other

## 2021-02-16 ENCOUNTER — Other Ambulatory Visit: Payer: Self-pay

## 2021-02-16 ENCOUNTER — Inpatient Hospital Stay: Payer: Medicare Other | Attending: Hematology & Oncology

## 2021-02-16 ENCOUNTER — Inpatient Hospital Stay (HOSPITAL_BASED_OUTPATIENT_CLINIC_OR_DEPARTMENT_OTHER): Payer: Medicare Other | Admitting: Hematology & Oncology

## 2021-02-16 VITALS — BP 139/62 | HR 70 | Resp 17

## 2021-02-16 VITALS — BP 147/62 | HR 68 | Temp 97.5°F | Resp 17 | Wt 179.1 lb

## 2021-02-16 DIAGNOSIS — Z794 Long term (current) use of insulin: Secondary | ICD-10-CM | POA: Diagnosis not present

## 2021-02-16 DIAGNOSIS — C9111 Chronic lymphocytic leukemia of B-cell type in remission: Secondary | ICD-10-CM | POA: Insufficient documentation

## 2021-02-16 DIAGNOSIS — Z85038 Personal history of other malignant neoplasm of large intestine: Secondary | ICD-10-CM | POA: Insufficient documentation

## 2021-02-16 DIAGNOSIS — R197 Diarrhea, unspecified: Secondary | ICD-10-CM | POA: Insufficient documentation

## 2021-02-16 DIAGNOSIS — C9110 Chronic lymphocytic leukemia of B-cell type not having achieved remission: Secondary | ICD-10-CM | POA: Diagnosis not present

## 2021-02-16 DIAGNOSIS — D509 Iron deficiency anemia, unspecified: Secondary | ICD-10-CM | POA: Diagnosis not present

## 2021-02-16 DIAGNOSIS — Z79899 Other long term (current) drug therapy: Secondary | ICD-10-CM | POA: Insufficient documentation

## 2021-02-16 DIAGNOSIS — E119 Type 2 diabetes mellitus without complications: Secondary | ICD-10-CM | POA: Diagnosis not present

## 2021-02-16 LAB — CMP (CANCER CENTER ONLY)
ALT: 12 U/L (ref 0–44)
AST: 16 U/L (ref 15–41)
Albumin: 3.8 g/dL (ref 3.5–5.0)
Alkaline Phosphatase: 65 U/L (ref 38–126)
Anion gap: 6 (ref 5–15)
BUN: 19 mg/dL (ref 8–23)
CO2: 26 mmol/L (ref 22–32)
Calcium: 8.5 mg/dL — ABNORMAL LOW (ref 8.9–10.3)
Chloride: 112 mmol/L — ABNORMAL HIGH (ref 98–111)
Creatinine: 1.27 mg/dL — ABNORMAL HIGH (ref 0.44–1.00)
GFR, Estimated: 42 mL/min — ABNORMAL LOW (ref >60)
Glucose, Bld: 97 mg/dL (ref 70–99)
Potassium: 4.5 mmol/L (ref 3.5–5.1)
Sodium: 144 mmol/L (ref 135–145)
Total Bilirubin: 0.5 mg/dL (ref 0.3–1.2)
Total Protein: 6.3 g/dL — ABNORMAL LOW (ref 6.5–8.1)

## 2021-02-16 LAB — CBC WITH DIFFERENTIAL (CANCER CENTER ONLY)
Abs Immature Granulocytes: 0.02 10*3/uL (ref 0.00–0.07)
Basophils Absolute: 0 10*3/uL (ref 0.0–0.1)
Basophils Relative: 1 %
Eosinophils Absolute: 0.2 10*3/uL (ref 0.0–0.5)
Eosinophils Relative: 3 %
HCT: 36.5 % (ref 36.0–46.0)
Hemoglobin: 11.7 g/dL — ABNORMAL LOW (ref 12.0–15.0)
Immature Granulocytes: 0 %
Lymphocytes Relative: 49 %
Lymphs Abs: 3 10*3/uL (ref 0.7–4.0)
MCH: 30.9 pg (ref 26.0–34.0)
MCHC: 32.1 g/dL (ref 30.0–36.0)
MCV: 96.3 fL (ref 80.0–100.0)
Monocytes Absolute: 0.9 10*3/uL (ref 0.1–1.0)
Monocytes Relative: 14 %
Neutro Abs: 2 10*3/uL (ref 1.7–7.7)
Neutrophils Relative %: 33 %
Platelet Count: 155 10*3/uL (ref 150–400)
RBC: 3.79 MIL/uL — ABNORMAL LOW (ref 3.87–5.11)
RDW: 14.2 % (ref 11.5–15.5)
WBC Count: 6 10*3/uL (ref 4.0–10.5)
nRBC: 0 % (ref 0.0–0.2)

## 2021-02-16 LAB — LACTATE DEHYDROGENASE: LDH: 151 U/L (ref 98–192)

## 2021-02-16 LAB — SAVE SMEAR(SSMR), FOR PROVIDER SLIDE REVIEW

## 2021-02-16 MED ORDER — SODIUM CHLORIDE 0.9 % IV SOLN
40.0000 mg | Freq: Once | INTRAVENOUS | Status: AC
  Administered 2021-02-16: 40 mg via INTRAVENOUS
  Filled 2021-02-16: qty 4

## 2021-02-16 MED ORDER — SODIUM CHLORIDE 0.9 % IV SOLN: Freq: Once | INTRAVENOUS | Status: AC

## 2021-02-16 MED ORDER — LORAZEPAM 2 MG/ML IJ SOLN
0.5000 mg | Freq: Once | INTRAMUSCULAR | Status: AC
  Administered 2021-02-16: 0.5 mg via INTRAVENOUS

## 2021-02-16 MED ORDER — SODIUM CHLORIDE 0.9 % IV SOLN
375.0000 mg/m2 | Freq: Once | INTRAVENOUS | Status: AC
  Administered 2021-02-16: 700 mg via INTRAVENOUS
  Filled 2021-02-16: qty 50

## 2021-02-16 MED ORDER — DIPHENHYDRAMINE HCL 25 MG PO CAPS
50.0000 mg | ORAL_CAPSULE | Freq: Once | ORAL | Status: AC
  Administered 2021-02-16: 50 mg via ORAL
  Filled 2021-02-16: qty 2

## 2021-02-16 MED ORDER — LORAZEPAM 2 MG/ML IJ SOLN
INTRAMUSCULAR | Status: AC
  Filled 2021-02-16: qty 1

## 2021-02-16 MED ORDER — METHYLPREDNISOLONE SODIUM SUCC 125 MG IJ SOLR
80.0000 mg | Freq: Once | INTRAMUSCULAR | Status: AC
  Administered 2021-02-16: 80 mg via INTRAVENOUS
  Filled 2021-02-16: qty 2

## 2021-02-16 MED ORDER — ACETAMINOPHEN 325 MG PO TABS
650.0000 mg | ORAL_TABLET | Freq: Once | ORAL | Status: AC
  Administered 2021-02-16: 650 mg via ORAL
  Filled 2021-02-16: qty 2

## 2021-02-16 NOTE — Progress Notes (Signed)
Hematology and Oncology Follow Up Visit  [name] [id number] June 17, 1940 81 y.o. 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  She is doing quite well.  She really has had no problems since we last saw her.  She is having little bit problem with the right knee.  I think she does have little bit of arthritis in her legs.  She is diabetic.  We do not have her blood sugars back yet from today.  She has had no problems with fever.  She has had a little bit of diarrhea.  She says she has some diarrhea and vomiting last night.  She thinks it is from eating right before bedtime.  She has had no swollen lymph nodes.  There is been no rashes.  She has had no headache.  There is been no blurred vision.  She has had no cough or shortness of breath.  Overall, she is tolerating the acalabrutinib very nicely.  Currently, her performance status is ECOG 1.  .    Medications:  Allergies as of 02/16/2021       Reactions   Codeine Other (See Comments)   REACTION: makes her nervous, orTylenol #3   Ibuprofen Other (See Comments)   REACTION: nervous   Meperidine Hcl Nausea And Vomiting   Naproxen Sodium Other (See Comments)   REACTION: nervous   Tramadol Other (See Comments)   Insomnia         Medication List        Accurate as of February 16, 2021  9:15 AM. If you have any questions, ask your nurse or doctor.          amLODipine 5 MG tablet Commonly known as: NORVASC TAKE 1 TABLET(5 MG) BY MOUTH DAILY   aspirin 81 MG chewable tablet Chew 1 tablet (81 mg total) by mouth 2 (two) times daily with a meal. What changed: when to take this   atenolol 100 MG tablet Commonly known as:  TENORMIN TAKE 1 TABLET(100 MG) BY MOUTH DAILY   atorvastatin 20 MG tablet Commonly known as: LIPITOR TAKE 1 TABLET(20 MG) BY MOUTH DAILY   azelastine 0.1 % nasal spray Commonly known as: ASTELIN Place 2 sprays into both nostrils at bedtime as needed for rhinitis or allergies. Use in each nostril as directed   Calquence 100 MG Tabs Generic drug: Acalabrutinib Maleate Take 100 mg by mouth daily.   cholecalciferol 1000 units tablet Commonly known as: VITAMIN D Take 1,000 Units by mouth daily.   clopidogrel 75 MG tablet Commonly known as: PLAVIX TAKE 1 TABLET(75 MG) BY MOUTH DAILY   clorazepate 7.5 MG tablet Commonly known as: TRANXENE Take 1 tablet (7.5 mg total) by mouth as needed.   fluticasone 50 MCG/ACT nasal spray Commonly known as: FLONASE SHAKE LIQUID AND USE 2 SPRAYS IN EACH NOSTRIL DAILY What changed: See the new instructions.   insulin NPH Human 100 UNIT/ML injection Commonly known as: NOVOLIN N Inject 36 units into the skin every morning and inject 24 units into the skin at bedtime.   insulin regular 100 units/mL injection Commonly known as: NOVOLIN R Inject 12 Units into the skin 2 (two) times daily before a meal.   INSULIN SYRINGE .  5CC/31GX5/16" 31G X 5/16" 0.5 ML Misc USE TO INJECT INSULIN 5 TIMES PER DAY   irbesartan 300 MG tablet Commonly known as: AVAPRO TAKE 1/2 TABLET(150 MG) BY MOUTH DAILY   isosorbide mononitrate 30 MG 24 hr tablet Commonly known as: IMDUR TAKE 1 TABLET BY MOUTH EVERY DAY   metFORMIN 500 MG tablet Commonly known as: GLUCOPHAGE TAKE 1 TABLET(500 MG) BY MOUTH DAILY WITH SUPPER   mometasone 0.1 % cream Commonly known as: ELOCON Apply 1 application topically daily. Use as directed   multivitamin capsule Take 1 capsule by mouth daily.   nitroGLYCERIN 0.3 MG SL tablet Commonly known as: Nitrostat Place 1 tablet (0.3 mg total) under the tongue every 5 (five) minutes as needed for chest pain (ER if no better after 3  tablets).   omeprazole 20 MG capsule Commonly known as: PRILOSEC TAKE 1 CAPSULE(20 MG) BY MOUTH DAILY   ondansetron 8 MG tablet Commonly known as: ZOFRAN Take 1 tablet (8 mg total) by mouth every 8 (eight) hours as needed for nausea or vomiting.   ONE TOUCH ULTRA SYSTEM KIT w/Device Kit 1 kit by Does not apply route once.   OneTouch Ultra test strip Generic drug: glucose blood USE TO TEST BLOOD SUGAR 3 TIMES DAILY Dx code E11.65   Victoza 18 MG/3ML Sopn Generic drug: liraglutide ADMINISTER 1.2 MG UNDER THE SKIN DAILY        Allergies:  Allergies  Allergen Reactions   Codeine Other (See Comments)    REACTION: makes her nervous, orTylenol #3   Ibuprofen Other (See Comments)    REACTION: nervous   Meperidine Hcl Nausea And Vomiting   Naproxen Sodium Other (See Comments)    REACTION: nervous   Tramadol Other (See Comments)    Insomnia     Past Medical History, Surgical history, Social history, and Family History were reviewed and updated.  Review of Systems: Review of Systems  Constitutional: Negative.   HENT: Negative.    Eyes: Negative.   Respiratory: Negative.    Cardiovascular: Negative.   Gastrointestinal: Negative.   Genitourinary: Negative.   Musculoskeletal: Negative.   Skin: Negative.   Neurological: Negative.   Endo/Heme/Allergies: Negative.   Psychiatric/Behavioral: Negative.      Physical Exam:  weight is 179 lb 1.3 oz (81.2 kg). Her oral temperature is 97.5 F (36.4 C) (abnormal). Her blood pressure is 147/62 (abnormal) and her pulse is 68. Her respiration is 17 and oxygen saturation is 100%.   Wt Readings from Last 3 Encounters:  02/16/21 179 lb 1.3 oz (81.2 kg)  01/17/21 176 lb 1.3 oz (79.9 kg)  12/20/20 178 lb (80.7 kg)    Physical Exam Vitals reviewed.  HENT:     Head: Normocephalic and atraumatic.  Eyes:     Pupils: Pupils are equal, round, and reactive to light.  Cardiovascular:     Rate and Rhythm: Normal rate and regular  rhythm.     Heart sounds: Normal heart sounds.  Pulmonary:     Effort: Pulmonary effort is normal.     Breath sounds: Normal breath sounds.  Abdominal:     General: Bowel sounds are normal.     Palpations: Abdomen is soft.  Musculoskeletal:        General: No tenderness or deformity. Normal range of motion.     Cervical back: Normal range of motion.  Lymphadenopathy:     Cervical: No cervical adenopathy.  Skin:    General: Skin is warm and dry.  Findings: No erythema or rash.  Neurological:     Mental Status: She is alert and oriented to person, place, and time.  Psychiatric:        Behavior: Behavior normal.        Thought Content: Thought content normal.        Judgment: Judgment normal.     Lab Results  Component Value Date   WBC 6.0 02/16/2021   HGB 11.7 (L) 02/16/2021   HCT 36.5 02/16/2021   MCV 96.3 02/16/2021   PLT 155 02/16/2021   Lab Results  Component Value Date   FERRITIN 90 08/24/2020   IRON 81 08/24/2020   TIBC 310 08/24/2020   UIBC 229 08/24/2020   IRONPCTSAT 26 08/24/2020   Lab Results  Component Value Date   RETICCTPCT 1.9 08/24/2020   RBC 3.79 (L) 02/16/2021   Lab Results  Component Value Date   KPAFRELGTCHN 40.8 (H) 11/18/2020   LAMBDASER 29.3 (H) 11/18/2020   KAPLAMBRATIO 1.39 11/18/2020   Lab Results  Component Value Date   IGGSERUM 764 12/20/2020   IGA 157 12/20/2020   IGMSERUM 23 (L) 12/20/2020   Lab Results  Component Value Date   TOTALPROTELP 6.5 11/18/2020   ALBUMINELP 3.6 11/18/2020   A1GS 0.3 11/18/2020   A2GS 0.9 11/18/2020   BETS 1.0 11/18/2020   BETA2SER 6.2 02/25/2014   GAMS 0.7 11/18/2020   MSPIKE Not Observed 11/18/2020   SPEI * 02/25/2014     Chemistry      Component Value Date/Time   NA 144 01/17/2021 0830   NA 147 (H) 11/03/2016 1018   NA 143 08/30/2016 0954   K 4.6 01/17/2021 0830   K 3.9 11/03/2016 1018   K 4.5 08/30/2016 0954   CL 111 01/17/2021 0830   CL 110 (H) 11/03/2016 1018   CO2 26  01/17/2021 0830   CO2 27 11/03/2016 1018   CO2 24 08/30/2016 0954   BUN 20 01/17/2021 0830   BUN 25 (H) 11/03/2016 1018   BUN 20.5 08/30/2016 0954   CREATININE 1.31 (H) 01/17/2021 0830   CREATININE 1.6 (H) 11/03/2016 1018   CREATININE 1.4 (H) 08/30/2016 0954      Component Value Date/Time   CALCIUM 9.9 01/17/2021 0830   CALCIUM 10.1 11/03/2016 1018   CALCIUM 9.8 08/30/2016 0954   ALKPHOS 80 01/17/2021 0830   ALKPHOS 112 (H) 11/03/2016 1018   ALKPHOS 113 08/30/2016 0954   AST 16 01/17/2021 0830   AST 23 08/30/2016 0954   ALT 11 01/17/2021 0830   ALT 29 11/03/2016 1018   ALT 20 08/30/2016 0954   BILITOT 0.4 01/17/2021 0830   BILITOT 0.38 08/30/2016 0954      Impression and Plan: Ms. Knoff is a very pleasant 81 yo caucasian female with CLL stage A.  So far, we have not had any negative prognostic markers.  She does have the 13q-chromosomal abnormality.  This is typically construed as a good prognostic marker.  This will be 1/6 and final cycle of Rituxan.  After this cycle, I will repeat her scans.  I will do the scans about a week before I see her back.  I would like to then get her on venetoclax along with the acalabrutinib.  I think this would be a wonderful combination that we could keep her on for about a year or so.  Her blood smear, looks like she is in remission.  She has responded very well to the Rituxan/acalabrutinib combination.  I will now plan  to get her back in about 6 weeks.  [name], MD 2/15/20239:15 AM

## 2021-02-16 NOTE — Progress Notes (Signed)
Patient c/o nausea. Per Dr. Marin Olp, give ativan 0.5  IV now.

## 2021-02-16 NOTE — Patient Instructions (Signed)
[facility]  Discharge Instructions: ?Thank you for choosing [facility] to provide your oncology and hematology care.  ? ?If you have a lab appointment with the Cancer Center, please go directly to the Cancer Center and check in at the registration area. ? ?Wear comfortable clothing and clothing appropriate for easy access to any Portacath or PICC line.  ? ?We strive to give you quality time with your provider. You may need to reschedule your appointment if you arrive late (15 or more minutes).  Arriving late affects you and other patients whose appointments are after yours.  Also, if you miss three or more appointments without notifying the office, you may be dismissed from the clinic at the provider?s discretion.    ?  ?For prescription refill requests, have your pharmacy contact our office and allow 72 hours for refills to be completed.   ? ?Today you received the following chemotherapy and/or immunotherapy agents Rituxan  ?  ?To help prevent nausea and vomiting after your treatment, we encourage you to take your nausea medication as directed. ? ?BELOW ARE SYMPTOMS THAT SHOULD BE REPORTED IMMEDIATELY: ?*FEVER GREATER THAN 100.4 F (38 ?C) OR HIGHER ?*CHILLS OR SWEATING ?*NAUSEA AND VOMITING THAT IS NOT CONTROLLED WITH YOUR NAUSEA MEDICATION ?*UNUSUAL SHORTNESS OF BREATH ?*UNUSUAL BRUISING OR BLEEDING ?*URINARY PROBLEMS (pain or burning when urinating, or frequent urination) ?*BOWEL PROBLEMS (unusual diarrhea, constipation, pain near the anus) ?TENDERNESS IN MOUTH AND THROAT WITH OR WITHOUT PRESENCE OF ULCERS (sore throat, sores in mouth, or a toothache) ?UNUSUAL RASH, SWELLING OR PAIN  ?UNUSUAL VAGINAL DISCHARGE OR ITCHING  ? ?Items with * indicate a potential emergency and should be followed up as soon as possible or go to the Emergency Department if any problems should occur. ? ?Please show the CHEMOTHERAPY ALERT CARD or IMMUNOTHERAPY ALERT CARD at check-in to the  Emergency Department and triage nurse. ?Should you have questions after your visit or need to cancel or reschedule your appointment, please contact [facility]  [phone] and follow the prompts.  Office hours are 8:00 a.m. to 4:30 p.m. Monday - Friday. Please note that voicemails left after 4:00 p.m. may not be returned until the following business day.  We are closed weekends and major holidays. You have access to a nurse at all times for urgent questions. Please call the main number to the clinic [phone] and follow the prompts. ? ?For any non-urgent questions, you may also contact your provider using MyChart. We now offer e-Visits for anyone 18 and older to request care online for non-urgent symptoms. For details visit [email or web address]. ?  ?Also download the MyChart app! Go to the app store, search "MyChart", open the app, select [facility], and log in with your MyChart username and password. ? ?Due to Covid, a mask is required upon entering the hospital/clinic. If you do not have a mask, one will be given to you upon arrival. For doctor visits, patients may have 1 support person aged 18 or older with them. For treatment visits, patients cannot have anyone with them due to current Covid guidelines and our immunocompromised population.  ?

## 2021-02-17 LAB — IGG, IGA, IGM
IgA: 149 mg/dL (ref 64–422)
IgG (Immunoglobin G), Serum: 733 mg/dL (ref 586–1602)
IgM (Immunoglobulin M), Srm: 21 mg/dL — ABNORMAL LOW (ref 26–217)

## 2021-02-25 ENCOUNTER — Other Ambulatory Visit: Payer: Self-pay | Admitting: Internal Medicine

## 2021-03-14 ENCOUNTER — Other Ambulatory Visit (HOSPITAL_COMMUNITY): Payer: Self-pay

## 2021-03-14 ENCOUNTER — Other Ambulatory Visit: Payer: Self-pay | Admitting: Hematology & Oncology

## 2021-03-14 DIAGNOSIS — C9110 Chronic lymphocytic leukemia of B-cell type not having achieved remission: Secondary | ICD-10-CM

## 2021-03-14 MED ORDER — CALQUENCE 100 MG PO TABS
100.0000 mg | ORAL_TABLET | Freq: Every day | ORAL | 5 refills | Status: DC
  Filled 2021-03-21: qty 30, 30d supply, fill #0
  Filled 2021-04-14: qty 30, 30d supply, fill #1
  Filled 2021-05-09: qty 30, 30d supply, fill #2
  Filled 2021-06-08: qty 30, 30d supply, fill #3
  Filled 2021-07-04: qty 30, 30d supply, fill #4
  Filled 2021-08-02: qty 30, 30d supply, fill #5

## 2021-03-15 ENCOUNTER — Other Ambulatory Visit (INDEPENDENT_AMBULATORY_CARE_PROVIDER_SITE_OTHER): Payer: Medicare Other

## 2021-03-15 ENCOUNTER — Other Ambulatory Visit: Payer: Self-pay

## 2021-03-15 DIAGNOSIS — Z794 Long term (current) use of insulin: Secondary | ICD-10-CM

## 2021-03-15 DIAGNOSIS — E1165 Type 2 diabetes mellitus with hyperglycemia: Secondary | ICD-10-CM | POA: Diagnosis not present

## 2021-03-15 LAB — BASIC METABOLIC PANEL
BUN: 16 mg/dL (ref 6–23)
CO2: 28 mEq/L (ref 19–32)
Calcium: 9.7 mg/dL (ref 8.4–10.5)
Chloride: 108 mEq/L (ref 96–112)
Creatinine, Ser: 1.22 mg/dL — ABNORMAL HIGH (ref 0.40–1.20)
GFR: 41.61 mL/min — ABNORMAL LOW (ref >60.00)
Glucose, Bld: 125 mg/dL — ABNORMAL HIGH (ref 70–99)
Potassium: 4.2 mEq/L (ref 3.5–5.1)
Sodium: 144 mEq/L (ref 135–145)

## 2021-03-15 LAB — HEMOGLOBIN A1C: Hgb A1c MFr Bld: 6.3 % (ref 4.6–6.5)

## 2021-03-17 ENCOUNTER — Ambulatory Visit (INDEPENDENT_AMBULATORY_CARE_PROVIDER_SITE_OTHER): Payer: Medicare Other | Admitting: Endocrinology

## 2021-03-17 ENCOUNTER — Encounter: Payer: Self-pay | Admitting: Endocrinology

## 2021-03-17 ENCOUNTER — Other Ambulatory Visit: Payer: Self-pay

## 2021-03-17 VITALS — BP 118/64 | HR 66 | Ht 66.0 in | Wt 180.8 lb

## 2021-03-17 DIAGNOSIS — I10 Essential (primary) hypertension: Secondary | ICD-10-CM

## 2021-03-17 DIAGNOSIS — R600 Localized edema: Secondary | ICD-10-CM

## 2021-03-17 DIAGNOSIS — E1165 Type 2 diabetes mellitus with hyperglycemia: Secondary | ICD-10-CM

## 2021-03-17 DIAGNOSIS — Z794 Long term (current) use of insulin: Secondary | ICD-10-CM

## 2021-03-17 NOTE — Progress Notes (Signed)
Patient ID: [name], female   DOB: 1940-12-10, 81 y.o.   MRN: [id number] ? ? ?       ? ? ?Reason for Appointment: Followup  ? ? ?History of Present Illness:  ?        ?Diagnosis: Type 2 diabetes mellitus, date of diagnosis: 1998      ? ?Past history:  ? ?She was treated with metformin at diagnosis when this had been continued until 2015 ?Subsequently Amaryl was also added several years ago and this has been continued ?Her blood sugars were under fair control between 2010 and early 2013 with A1c ranging from 7.4-9.4, mostly under 8% ?However since 08/2011 her A1c has been mostly over 8% ? ?Insulin was added in 2014 with sma limited bottom ll doses of Lantus and this has been progressively increased ?She was started on mealtime insulin on her initial consultation in 9/15 because of high postprandial readings, sometimes over 300 ?With adding Victoza in 02/2014 her blood sugars were somewhat better with A1c coming down below 8% ? ?Recent history:  ? ?INSULIN regimen: Regular 12 units a.m. and 12 units before dinner.  NPH 36 units in the morning and 24 hs ? ?Non-insulin hypoglycemic drugs the patient is taking are: Victoza 0.6 mg daily.  ? ? ?Current blood sugar patterns, daily management and problems identified: ? ?Her A1c is again lower than expected at 6.3 ? ?She was told to reduce her morning regular insulin to 12 units on the last 3 visits but she felt her sugars were running higher in the evenings and she increased her dose to 15 units on her own ?However her blood sugar in the lab in the afternoon was only 43 with mild symptoms and she is back on 12  ?Despite reminders she only checks her blood sugars before a meal in the morning and about 2 hours after dinner and these are all variable times  ?Blood sugar was 365 on the evening she had steroid injection with her chemotherapy infusion ?However she did not understand the connection between the Solu-Medrol and her glucose, also the next morning blood sugar was  higher at 186  ?She is mostly eating yogurt in the morning and not a large meal ?Fasting readings are excellent most of the time without hypoglycemia ?However blood sugars after dinner are variable based on her diet ?     ?Dinner is usually at 6-7 pm Bfst 10 am ? ?Glucose monitoring:  done 1-2 times a day         Glucometer: One Touch ultra 2.  ?     ?Blood Glucose readings by time of day from download for the last 2 weeks: ? ? ?PRE-MEAL Fasting Lunch Dinner Bedtime Overall  ?Glucose range: 115-260      ?Mean/median: 148    161  ? ?POST-MEAL PC Breakfast PC Lunch PC Dinner  ?Glucose range:   104-514  ?Mean/median:     ? ? ?PRE-MEAL Fasting Lunch Dinner Bedtime Overall  ?Glucose range: 76-186      ?Mean/median: 122    139  ? ?POST-MEAL PC Breakfast PC Lunch PC Dinner  ?Glucose range:   97-365  ?Mean/median:   170  ? ?Prior ? ?PRE-MEAL Fasting Lunch Dinner Bedtime Overall  ?Glucose range: 77-179    77-283  ?Mean/median: 120/120    136  ? ?POST-MEAL PC Breakfast PC Lunch PC Dinner  ?Glucose range:   98-283  ?Mean/median:   150  ? ? ?Self-care: The diet that  the patient has been following is: none, usually eating low fat meals ?Meals: 2-3 meals per day   ?       ?Dietician visit, most recent: never.  She saw the CDE in 02/2014  ?            ? ?Weight history: ? ?Wt Readings from Last 3 Encounters:  ?03/17/21 180 lb 12.8 oz (82 kg)  ?02/16/21 179 lb 1.3 oz (81.2 kg)  ?01/17/21 176 lb 1.3 oz (79.9 kg)  ? ? ?Glycemic control: ?  ?Lab Results  ?Component Value Date  ? HGBA1C 6.3 03/15/2021  ? HGBA1C 5.9 12/09/2020  ? HGBA1C 6.7 (H) 09/14/2020  ? ?Lab Results  ?Component Value Date  ? MICROALBUR 9.5 (H) 12/09/2020  ? [place] 41 05/05/2020  ? CREATININE 1.22 (H) 03/15/2021  ? ?Lab Results  ?Component Value Date  ? HGB 11.7 (L) 02/16/2021  ? ? ? ?OTHER active problems  discussed in review of systems ? ? ?Allergies as of 03/17/2021   ? ?   Reactions  ? Codeine Other (See Comments)  ? REACTION: makes her nervous, orTylenol #3  ?  Ibuprofen Other (See Comments)  ? REACTION: nervous  ? Meperidine Hcl Nausea And Vomiting  ? Naproxen Sodium Other (See Comments)  ? REACTION: nervous  ? Tramadol Other (See Comments)  ? Insomnia   ? ?  ? ?  ?Medication List  ?  ? ?  ? Accurate as of March 17, 2021  8:44 PM. If you have any questions, ask your nurse or doctor.  ?  ?  ? ?  ? ?amLODipine 5 MG tablet ?Commonly known as: NORVASC ?TAKE 1 TABLET(5 MG) BY MOUTH DAILY ?  ?aspirin 81 MG chewable tablet ?Chew 1 tablet (81 mg total) by mouth 2 (two) times daily with a meal. ?What changed: when to take this ?  ?atenolol 100 MG tablet ?Commonly known as: TENORMIN ?TAKE 1 TABLET(100 MG) BY MOUTH DAILY ?  ?atorvastatin 20 MG tablet ?Commonly known as: LIPITOR ?TAKE 1 TABLET(20 MG) BY MOUTH DAILY ?  ?azelastine 0.1 % nasal spray ?Commonly known as: ASTELIN ?Place 2 sprays into both nostrils at bedtime as needed for rhinitis or allergies. Use in each nostril as directed ?  ?Calquence 100 MG Tabs ?Generic drug: Acalabrutinib Maleate ?Take 100 mg by mouth daily. ?  ?cholecalciferol 1000 units tablet ?Commonly known as: VITAMIN D ?Take 1,000 Units by mouth daily. ?  ?clopidogrel 75 MG tablet ?Commonly known as: PLAVIX ?TAKE 1 TABLET(75 MG) BY MOUTH DAILY ?  ?clorazepate 7.5 MG tablet ?Commonly known as: TRANXENE ?Take 1 tablet (7.5 mg total) by mouth as needed. ?  ?fluticasone 50 MCG/ACT nasal spray ?Commonly known as: FLONASE ?SHAKE LIQUID AND USE 2 SPRAYS IN EACH NOSTRIL DAILY ?What changed: See the new instructions. ?  ?insulin NPH Human 100 UNIT/ML injection ?Commonly known as: NOVOLIN N ?Inject 36 units into the skin every morning and inject 24 units into the skin at bedtime. ?  ?insulin regular 100 units/mL injection ?Commonly known as: NOVOLIN R ?Inject 12 Units into the skin 2 (two) times daily before a meal. ?  ?INSULIN SYRINGE .5CC/31GX5/16" 31G X 5/16" 0.5 ML Misc ?USE TO INJECT INSULIN 5 TIMES PER DAY ?  ?irbesartan 300 MG tablet ?Commonly known as:  AVAPRO ?TAKE 1/2 TABLET(150 MG) BY MOUTH DAILY ?  ?isosorbide mononitrate 30 MG 24 hr tablet ?Commonly known as: IMDUR ?TAKE 1 TABLET BY MOUTH EVERY DAY ?  ?metFORMIN 500 MG tablet ?Commonly  known as: GLUCOPHAGE ?TAKE 1 TABLET(500 MG) BY MOUTH DAILY WITH SUPPER ?  ?mometasone 0.1 % cream ?Commonly known as: ELOCON ?Apply 1 application topically daily. Use as directed ?  ?multivitamin capsule ?Take 1 capsule by mouth daily. ?  ?nitroGLYCERIN 0.3 MG SL tablet ?Commonly known as: Nitrostat ?Place 1 tablet (0.3 mg total) under the tongue every 5 (five) minutes as needed for chest pain (ER if no better after 3 tablets). ?  ?omeprazole 20 MG capsule ?Commonly known as: PRILOSEC ?TAKE 1 CAPSULE(20 MG) BY MOUTH DAILY ?  ?ondansetron 8 MG tablet ?Commonly known as: ZOFRAN ?Take 1 tablet (8 mg total) by mouth every 8 (eight) hours as needed for nausea or vomiting. ?  ?ONE TOUCH ULTRA SYSTEM KIT w/Device Kit ?1 kit by Does not apply route once. ?  ?OneTouch Ultra test strip ?Generic drug: glucose blood ?USE TO TEST BLOOD SUGAR 3 TIMES DAILY Dx code E11.65 ?  ?Victoza 18 MG/3ML Sopn ?Generic drug: liraglutide ?ADMINISTER 1.2 MG UNDER THE SKIN DAILY ?  ? ?  ? ? ?Allergies:  ?Allergies  ?Allergen Reactions  ? Codeine Other (See Comments)  ?  REACTION: makes her nervous, orTylenol #3  ? Ibuprofen Other (See Comments)  ?  REACTION: nervous  ? Meperidine Hcl Nausea And Vomiting  ? Naproxen Sodium Other (See Comments)  ?  REACTION: nervous  ? Tramadol Other (See Comments)  ?  Insomnia   ? ? ?Past Medical History:  ?Diagnosis Date  ? Acute blood loss anemia 09/17/2015  ? Allergy   ? Anemia   ? Anginal pain ([place]) 2017  ? Anxiety   ? Arthritis   ? "hands and knees mostly" (09/16/2015)  ? B12 deficiency anemia   ? CAD (coronary artery disease)   ? Dr Gwenlyn Found  ? Cataract   ? bil cataracts removed  ? CLL (chronic lymphocytic leukemia) ([place]) 02/25/2014  ? Clotting disorder ([place])   ? Depression   ? DJD (degenerative joint disease)   ? Dupuytren's  contracture of both hands 08/30/2014  ? Gastritis   ? GERD (gastroesophageal reflux disease)   ? Headache(784.0)   ? History of blood transfusion   ? "I"ve had 20 some; thru birth of children, loss of blood,

## 2021-03-17 NOTE — Patient Instructions (Addendum)
Supper dose 10-14 R based on meal size and amount of Carbs ? ?Elastic/compression stockings for legs knee high ? ?Call DME supplier for [place] sensor ?

## 2021-03-21 ENCOUNTER — Other Ambulatory Visit (HOSPITAL_COMMUNITY): Payer: Self-pay

## 2021-03-23 ENCOUNTER — Encounter: Payer: Self-pay | Admitting: Internal Medicine

## 2021-03-25 ENCOUNTER — Encounter: Payer: Self-pay | Admitting: Hematology & Oncology

## 2021-03-25 ENCOUNTER — Telehealth: Payer: Self-pay | Admitting: Pharmacy Technician

## 2021-03-25 ENCOUNTER — Other Ambulatory Visit (HOSPITAL_COMMUNITY): Payer: Self-pay

## 2021-03-25 ENCOUNTER — Encounter: Payer: Self-pay | Admitting: Family

## 2021-03-25 NOTE — Telephone Encounter (Signed)
Oral Oncology Patient Advocate Encounter ? ?Was successful in securing patient a $8000 grant from [employer] to provide copayment coverage for [employer].  This will keep the out of pocket expense at $0.   ?  ?[employer] ID: [id number] ? ?I have spoken with the patient. ?  ?The billing information is as follows and has been shared with [place].  ?  ?RxBin: [id number] ?PCN: [id number] ?Member ID: [id number] ?Group ID: [id number] ?Dates of Eligibility: 02/23/21 through 02/22/22 ? ?Fund:  CLL ? ?Dennison Nancy CPHT ?Specialty Pharmacy Patient Advocate ?[place] ?Phone [phone] ?Fax [phone] ?03/25/2021 11:51 AM ?  ? ? ?

## 2021-03-28 ENCOUNTER — Ambulatory Visit (HOSPITAL_BASED_OUTPATIENT_CLINIC_OR_DEPARTMENT_OTHER)
Admission: RE | Admit: 2021-03-28 | Discharge: 2021-03-28 | Disposition: A | Discharge status: Home / Self Care | Payer: Medicare Other | Source: Ambulatory Visit | Attending: Hematology & Oncology | Admitting: Hematology & Oncology

## 2021-03-28 ENCOUNTER — Other Ambulatory Visit: Payer: Self-pay

## 2021-03-28 DIAGNOSIS — N281 Cyst of kidney, acquired: Secondary | ICD-10-CM | POA: Diagnosis not present

## 2021-03-28 DIAGNOSIS — C9110 Chronic lymphocytic leukemia of B-cell type not having achieved remission: Secondary | ICD-10-CM | POA: Insufficient documentation

## 2021-03-28 DIAGNOSIS — K8020 Calculus of gallbladder without cholecystitis without obstruction: Secondary | ICD-10-CM | POA: Diagnosis not present

## 2021-03-28 DIAGNOSIS — R590 Localized enlarged lymph nodes: Secondary | ICD-10-CM | POA: Diagnosis not present

## 2021-03-28 DIAGNOSIS — Z951 Presence of aortocoronary bypass graft: Secondary | ICD-10-CM | POA: Diagnosis not present

## 2021-03-28 DIAGNOSIS — I700 Atherosclerosis of aorta: Secondary | ICD-10-CM | POA: Diagnosis not present

## 2021-03-28 MED ORDER — IOHEXOL 300 MG/ML  SOLN
100.0000 mL | Freq: Once | INTRAMUSCULAR | Status: AC | PRN
  Administered 2021-03-28: 80 mL via INTRAVENOUS

## 2021-03-29 ENCOUNTER — Telehealth: Payer: Self-pay | Admitting: *Deleted

## 2021-03-29 NOTE — Telephone Encounter (Signed)
Pt called , discussed results. No concerns at this time. ? ?

## 2021-03-29 NOTE — Telephone Encounter (Signed)
-----   Message from [name], MD sent at 03/28/2021  4:29 PM EDT ----- ?Please call and let her know that the lymph nodes are pretty much gone.  There may be some small ones up in the chest but they are not pathologic.  Everything below the diaphragm in the abdomen and pelvis has resolved.  I am not surprised by this.  Great job! ? ?[name] ?

## 2021-03-29 NOTE — Telephone Encounter (Signed)
Attempt to reach pt to give results. Lmovm to call office for results. ?

## 2021-04-04 ENCOUNTER — Other Ambulatory Visit: Payer: Self-pay | Admitting: Cardiology

## 2021-04-07 ENCOUNTER — Encounter: Payer: Self-pay | Admitting: Hematology & Oncology

## 2021-04-07 ENCOUNTER — Other Ambulatory Visit: Payer: Self-pay | Admitting: Hematology & Oncology

## 2021-04-07 ENCOUNTER — Telehealth: Payer: Self-pay | Admitting: *Deleted

## 2021-04-07 ENCOUNTER — Other Ambulatory Visit (HOSPITAL_COMMUNITY): Payer: Self-pay

## 2021-04-07 ENCOUNTER — Inpatient Hospital Stay (HOSPITAL_BASED_OUTPATIENT_CLINIC_OR_DEPARTMENT_OTHER): Payer: Medicare Other | Admitting: Hematology & Oncology

## 2021-04-07 ENCOUNTER — Inpatient Hospital Stay: Payer: Medicare Other | Attending: Hematology & Oncology

## 2021-04-07 ENCOUNTER — Other Ambulatory Visit: Payer: Self-pay

## 2021-04-07 VITALS — BP 133/65 | HR 65 | Temp 97.9°F | Resp 18 | Ht 66.0 in | Wt 179.1 lb

## 2021-04-07 DIAGNOSIS — Q998 Other specified chromosome abnormalities: Secondary | ICD-10-CM | POA: Diagnosis not present

## 2021-04-07 DIAGNOSIS — D509 Iron deficiency anemia, unspecified: Secondary | ICD-10-CM | POA: Diagnosis not present

## 2021-04-07 DIAGNOSIS — C9110 Chronic lymphocytic leukemia of B-cell type not having achieved remission: Secondary | ICD-10-CM

## 2021-04-07 DIAGNOSIS — C9111 Chronic lymphocytic leukemia of B-cell type in remission: Secondary | ICD-10-CM | POA: Diagnosis present

## 2021-04-07 DIAGNOSIS — Z85048 Personal history of other malignant neoplasm of rectum, rectosigmoid junction, and anus: Secondary | ICD-10-CM | POA: Diagnosis not present

## 2021-04-07 DIAGNOSIS — Z7982 Long term (current) use of aspirin: Secondary | ICD-10-CM | POA: Insufficient documentation

## 2021-04-07 DIAGNOSIS — Z79899 Other long term (current) drug therapy: Secondary | ICD-10-CM | POA: Insufficient documentation

## 2021-04-07 LAB — CBC WITH DIFFERENTIAL (CANCER CENTER ONLY)
Abs Immature Granulocytes: 0.15 10*3/uL — ABNORMAL HIGH (ref 0.00–0.07)
Basophils Absolute: 0 10*3/uL (ref 0.0–0.1)
Basophils Relative: 0 %
Eosinophils Absolute: 0.1 10*3/uL (ref 0.0–0.5)
Eosinophils Relative: 1 %
HCT: 34.7 % — ABNORMAL LOW (ref 36.0–46.0)
Hemoglobin: 11.3 g/dL — ABNORMAL LOW (ref 12.0–15.0)
Immature Granulocytes: 2 %
Lymphocytes Relative: 24 %
Lymphs Abs: 2.3 10*3/uL (ref 0.7–4.0)
MCH: 30.6 pg (ref 26.0–34.0)
MCHC: 32.6 g/dL (ref 30.0–36.0)
MCV: 94 fL (ref 80.0–100.0)
Monocytes Absolute: 0.8 10*3/uL (ref 0.1–1.0)
Monocytes Relative: 9 %
Neutro Abs: 6.3 10*3/uL (ref 1.7–7.7)
Neutrophils Relative %: 64 %
Platelet Count: 153 10*3/uL (ref 150–400)
RBC: 3.69 MIL/uL — ABNORMAL LOW (ref 3.87–5.11)
RDW: 14.9 % (ref 11.5–15.5)
WBC Count: 9.7 10*3/uL (ref 4.0–10.5)
nRBC: 0 % (ref 0.0–0.2)

## 2021-04-07 LAB — CMP (CANCER CENTER ONLY)
ALT: 11 U/L (ref 0–44)
AST: 26 U/L (ref 15–41)
Albumin: 3.6 g/dL (ref 3.5–5.0)
Alkaline Phosphatase: 57 U/L (ref 38–126)
Anion gap: 8 (ref 5–15)
BUN: 20 mg/dL (ref 8–23)
CO2: 22 mmol/L (ref 22–32)
Calcium: 9.2 mg/dL (ref 8.9–10.3)
Chloride: 113 mmol/L — ABNORMAL HIGH (ref 98–111)
Creatinine: 1.51 mg/dL — ABNORMAL HIGH (ref 0.44–1.00)
GFR, Estimated: 35 mL/min — ABNORMAL LOW (ref >60)
Glucose, Bld: 96 mg/dL (ref 70–99)
Potassium: 3.8 mmol/L (ref 3.5–5.1)
Sodium: 143 mmol/L (ref 135–145)
Total Bilirubin: 0.7 mg/dL (ref 0.3–1.2)
Total Protein: 6.6 g/dL (ref 6.5–8.1)

## 2021-04-07 LAB — LACTATE DEHYDROGENASE: LDH: 298 U/L — ABNORMAL HIGH (ref 98–192)

## 2021-04-07 LAB — URIC ACID: Uric Acid, Serum: 7.5 mg/dL — ABNORMAL HIGH (ref 2.5–7.1)

## 2021-04-07 MED ORDER — VENETOCLAX 50 MG PO TABS
50.0000 mg | ORAL_TABLET | Freq: Every day | ORAL | 4 refills | Status: DC
  Filled 2021-04-07: qty 21, 21d supply, fill #0

## 2021-04-07 MED ORDER — ALLOPURINOL 100 MG PO TABS
100.0000 mg | ORAL_TABLET | Freq: Every day | ORAL | 0 refills | Status: DC
Start: 2021-04-07 — End: 2021-04-08

## 2021-04-07 NOTE — Telephone Encounter (Signed)
Per 04/07/21 los - gave upcoming appointment- confirmed ?

## 2021-04-07 NOTE — Progress Notes (Signed)
?Hematology and Oncology Follow Up Visit ? ?[place] ?[id number] ?24-Mar-1940 81 y.o. ?04/07/2021 ? ? 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 ?Current Therapy:  ?Rituxan/Acalabrutinib -- s/p cycle 6-- start on 09/15/2020  ?Venetoclax 50 mg po q day x 7 days, then 100 mg po q day ?IV iron as indicated-patient received a dose in 01/2019 ?  ?Interim History: [name] is here today for follow-up.  So far, she is doing pretty well.  She did have some bruising and blisters after doing some gardening.  She is on aspirin and Plavix was unsure of the reason why this happened. ? ?She did have a CT scan done on 03/28/2021.  The CT scan showed resolution of her adenopathy above and below the diaphragm. ? ?She has had 6 cycles of the Rituxan/acalabrutinib.  At this point, I want to drop the Rituxan and add venetoclax to her protocol.  I think this would be perfect for her.  I will start her off at 50 mg daily and then go up to 100 mg a day.  I think given her age, we can probably stay on 100 mg a day. ? ?I noted that her uric acid was little bit high today.  I will put her on allopurinol at 100 mg a day for 30 days.  That should be all that we would need. ? ?I just do not think that she is going to run into problems with tumor lysis. ? ?I did talk to her about the venetoclax.  I went over some of the side effects.  I think she will do okay with this.  Again, she is going to be on a low-dose.  I do still think she needs full dose, by any means. ? ?I think that she probably needs to be on the acalabrutinib/venetoclax combination for 2 years.  I think this would get her into a very good remission and hopefully get her into minimal residual disease. ? ?She is looking forward to a nice Easter with her family. ? ?Her appetite has been okay.  She has had no nausea or vomiting.  She has had no cough.  She has had no bleeding.  Again she has had the  bruising on her hands. ? ?Overall, I would say that her performance status right now is probably ECOG 1.   ? ?Medications:  ?Allergies as of 04/07/2021   ? ?   Reactions  ? Codeine Other (See Comments)  ? REACTION: makes her nervous, orTylenol #3  ? Ibuprofen Other (See Comments)  ? REACTION: nervous  ? Meperidine Hcl Nausea And Vomiting  ? Naproxen Sodium Other (See Comments)  ? REACTION: nervous  ? Tramadol Other (See Comments)  ? Insomnia   ? ?  ? ?  ?Medication List  ?  ? ?  ? Accurate as of April 07, 2021  9:31 AM. If you have any questions, ask your nurse or doctor.  ?  ?  ? ?  ? ?amLODipine 5 MG tablet ?Commonly known as: NORVASC ?TAKE 1 TABLET(5 MG) BY MOUTH DAILY ?  ?aspirin 81 MG chewable tablet ?Chew 1 tablet (81 mg total) by mouth 2 (two) times daily with a meal. ?What changed: when to take this ?  ?atenolol 100 MG tablet ?Commonly known as: TENORMIN ?TAKE 1 TABLET(100 MG) BY MOUTH DAILY ?  ?atorvastatin 20 MG tablet ?Commonly known as: LIPITOR ?TAKE 1 TABLET(20 MG)  BY MOUTH DAILY ?  ?azelastine 0.1 % nasal spray ?Commonly known as: ASTELIN ?Place 2 sprays into both nostrils at bedtime as needed for rhinitis or allergies. Use in each nostril as directed ?  ?Calquence 100 MG Tabs ?Generic drug: Acalabrutinib Maleate ?Take 1 tablet (100 mg) by mouth daily. ?  ?cholecalciferol 1000 units tablet ?Commonly known as: VITAMIN D ?Take 1,000 Units by mouth daily. ?  ?clopidogrel 75 MG tablet ?Commonly known as: PLAVIX ?TAKE 1 TABLET(75 MG) BY MOUTH DAILY ?  ?clorazepate 7.5 MG tablet ?Commonly known as: TRANXENE ?Take 1 tablet (7.5 mg total) by mouth as needed. ?  ?fluticasone 50 MCG/ACT nasal spray ?Commonly known as: FLONASE ?SHAKE LIQUID AND USE 2 SPRAYS IN EACH NOSTRIL DAILY ?What changed: See the new instructions. ?  ?insulin NPH Human 100 UNIT/ML injection ?Commonly known as: NOVOLIN N ?Inject 36 units into the skin every morning and inject 24 units into the skin at bedtime. ?  ?insulin regular 100 units/mL  injection ?Commonly known as: NOVOLIN R ?Inject 12 Units into the skin 2 (two) times daily before a meal. ?  ?INSULIN SYRINGE .5CC/31GX5/16" 31G X 5/16" 0.5 ML Misc ?USE TO INJECT INSULIN 5 TIMES PER DAY ?  ?irbesartan 300 MG tablet ?Commonly known as: AVAPRO ?TAKE 1/2 TABLET(150 MG) BY MOUTH DAILY ?  ?isosorbide mononitrate 30 MG 24 hr tablet ?Commonly known as: IMDUR ?TAKE 1 TABLET BY MOUTH EVERY DAY ?  ?metFORMIN 500 MG tablet ?Commonly known as: GLUCOPHAGE ?TAKE 1 TABLET(500 MG) BY MOUTH DAILY WITH SUPPER ?  ?mometasone 0.1 % cream ?Commonly known as: ELOCON ?Apply 1 application topically daily. Use as directed ?  ?multivitamin capsule ?Take 1 capsule by mouth daily. ?  ?nitroGLYCERIN 0.3 MG SL tablet ?Commonly known as: Nitrostat ?Place 1 tablet (0.3 mg total) under the tongue every 5 (five) minutes as needed for chest pain (ER if no better after 3 tablets). ?  ?omeprazole 20 MG capsule ?Commonly known as: PRILOSEC ?TAKE 1 CAPSULE(20 MG) BY MOUTH DAILY ?  ?ondansetron 8 MG tablet ?Commonly known as: ZOFRAN ?Take 1 tablet (8 mg total) by mouth every 8 (eight) hours as needed for nausea or vomiting. ?  ?ONE TOUCH ULTRA SYSTEM KIT w/Device Kit ?1 kit by Does not apply route once. ?  ?OneTouch Ultra test strip ?Generic drug: glucose blood ?USE TO TEST BLOOD SUGAR 3 TIMES DAILY Dx code E11.65 ?  ?Victoza 18 MG/3ML Sopn ?Generic drug: liraglutide ?ADMINISTER 1.2 MG UNDER THE SKIN DAILY ?  ? ?  ? ? ?Allergies:  ?Allergies  ?Allergen Reactions  ? Codeine Other (See Comments)  ?  REACTION: makes her nervous, orTylenol #3  ? Ibuprofen Other (See Comments)  ?  REACTION: nervous  ? Meperidine Hcl Nausea And Vomiting  ? Naproxen Sodium Other (See Comments)  ?  REACTION: nervous  ? Tramadol Other (See Comments)  ?  Insomnia   ? ? ?Past Medical History, Surgical history, Social history, and Family History were reviewed and updated. ? ?Review of Systems: ?Review of Systems  ?Constitutional: Negative.   ?HENT: Negative.     ?Eyes: Negative.   ?Respiratory: Negative.    ?Cardiovascular: Negative.   ?Gastrointestinal: Negative.   ?Genitourinary: Negative.   ?Musculoskeletal: Negative.   ?Skin: Negative.   ?Neurological: Negative.   ?Endo/Heme/Allergies: Negative.   ?Psychiatric/Behavioral: Negative.    ? ? ?Physical Exam: ? vitals were not taken for this visit.  ? ?Wt Readings from Last 3 Encounters:  ?03/17/21 180 lb 12.8 oz (82  kg)  ?02/16/21 179 lb 1.3 oz (81.2 kg)  ?01/17/21 176 lb 1.3 oz (79.9 kg)  ? ? ?Physical Exam ?Vitals reviewed.  ?HENT:  ?   Head: Normocephalic and atraumatic.  ?Eyes:  ?   Pupils: Pupils are equal, round, and reactive to light.  ?Cardiovascular:  ?   Rate and Rhythm: Normal rate and regular rhythm.  ?   Heart sounds: Normal heart sounds.  ?Pulmonary:  ?   Effort: Pulmonary effort is normal.  ?   Breath sounds: Normal breath sounds.  ?Abdominal:  ?   General: Bowel sounds are normal.  ?   Palpations: Abdomen is soft.  ?Musculoskeletal:     ?   General: No tenderness or deformity. Normal range of motion.  ?   Cervical back: Normal range of motion.  ?Lymphadenopathy:  ?   Cervical: No cervical adenopathy.  ?Skin: ?   General: Skin is warm and dry.  ?   Findings: No erythema or rash.  ?Neurological:  ?   Mental Status: She is alert and oriented to person, place, and time.  ?Psychiatric:     ?   Behavior: Behavior normal.     ?   Thought Content: Thought content normal.     ?   Judgment: Judgment normal.  ? ? ? ?Lab Results  ?Component Value Date  ? WBC 9.7 04/07/2021  ? HGB 11.3 (L) 04/07/2021  ? HCT 34.7 (L) 04/07/2021  ? MCV 94.0 04/07/2021  ? PLT 153 04/07/2021  ? ?Lab Results  ?Component Value Date  ? FERRITIN 90 08/24/2020  ? IRON 81 08/24/2020  ? TIBC 310 08/24/2020  ? UIBC 229 08/24/2020  ? IRONPCTSAT 26 08/24/2020  ? ?Lab Results  ?Component Value Date  ? RETICCTPCT 1.9 08/24/2020  ? RBC 3.69 (L) 04/07/2021  ? ?Lab Results  ?Component Value Date  ? KPAFRELGTCHN 40.8 (H) 11/18/2020  ? LAMBDASER 29.3 (H)  11/18/2020  ? KAPLAMBRATIO 1.39 11/18/2020  ? ?Lab Results  ?Component Value Date  ? IGGSERUM 733 02/16/2021  ? IGA 149 02/16/2021  ? IGMSERUM 21 (L) 02/16/2021  ? ?Lab Results  ?Component Value Date  ? TOTALPR

## 2021-04-08 ENCOUNTER — Other Ambulatory Visit (HOSPITAL_COMMUNITY): Payer: Self-pay

## 2021-04-08 ENCOUNTER — Encounter: Payer: Self-pay | Admitting: Family

## 2021-04-08 ENCOUNTER — Encounter: Payer: Self-pay | Admitting: Hematology & Oncology

## 2021-04-08 LAB — SURGICAL PATHOLOGY

## 2021-04-11 ENCOUNTER — Other Ambulatory Visit (HOSPITAL_COMMUNITY): Payer: Self-pay

## 2021-04-11 ENCOUNTER — Telehealth: Payer: Self-pay | Admitting: Pharmacist

## 2021-04-11 ENCOUNTER — Telehealth: Payer: Self-pay | Admitting: Pharmacy Technician

## 2021-04-11 DIAGNOSIS — C9110 Chronic lymphocytic leukemia of B-cell type not having achieved remission: Secondary | ICD-10-CM

## 2021-04-11 LAB — FLOW CYTOMETRY

## 2021-04-11 MED ORDER — VENETOCLAX 50 MG PO TABS
50.0000 mg | ORAL_TABLET | Freq: Every day | ORAL | 0 refills | Status: AC
  Filled 2021-04-11 – 2021-04-12 (×2): qty 7, 7d supply, fill #0

## 2021-04-11 MED ORDER — VENETOCLAX 100 MG PO TABS
100.0000 mg | ORAL_TABLET | Freq: Every day | ORAL | 3 refills | Status: DC
  Filled 2021-04-11 – 2021-04-12 (×2): qty 28, 28d supply, fill #0
  Filled 2021-05-09: qty 28, 28d supply, fill #1
  Filled 2021-06-08: qty 28, 28d supply, fill #2
  Filled 2021-07-04: qty 28, 28d supply, fill #3

## 2021-04-11 NOTE — Telephone Encounter (Signed)
Oral Oncology Patient Advocate Encounter ? ?Prior Authorization for [name] has been approved.   ? ?PA# [id number] ?Effective dates: 04/11/21 through 01/01/22 ? ?Patients co-pay is $69.18 ('50mg'$  tablets for quantity of 21) ? ?Oral Oncology Clinic will continue to follow.  ? ?Dennison Nancy CPHT ?Specialty Pharmacy Patient Advocate ?[place] ?Phone [phone] ?Fax [phone] ?04/11/2021 8:50 AM ? ?

## 2021-04-11 NOTE — Telephone Encounter (Addendum)
Oral Oncology Pharmacist Encounter ? ?Received new prescription for Venclexta (venetoclax) for the treatment of CLL in conjunction with acalabrutinib, planned duration 2 years. ? ?CBC w/ Diff, CMP, LDH and uric acid from 04/07/21 assessed, noted SCr 1.51 mg/dL (CrCl ~37 mL/min). 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 LDH up to 298 U/L (up from 151 U/L previously). Prescription dose and frequency assessed - dosing and dose escalation per MD discretion. Confirmed with Dr. Marin Olp he would like patient to utilize 50 mg tablets for the first 7 days, and then switch to 100 mg tablets thereafter to decrease tablet burden. Prescription updated and redirected to [facility] for dispensing.  ? ?Current medication list in Epic reviewed, DDIs with Venetoclax identified: ?Category C drug-drug interactions between Venetoclax and Metformin, Victoza, Insuplin NPH, and insulin regular - due to risk of hyperglycemia with Venetoclax. Noted patient's glucose stable at 96 mg/dL on 04/07/21. No change in therapy warranted at this time, patient may need dose adjustment of anti-hyperglycemic agents if hyperglycemia does occur.  ? ?Evaluated chart and no patient barriers to medication adherence noted.  ? ?Prescription has been e-scribed to the [facility] for benefits analysis and approval. ? ?Oral Oncology Clinic will continue to follow for insurance authorization, copayment issues, initial counseling and start date. ? ?Leron Croak, PharmD, BCPS ?Hematology/Oncology Clinical Pharmacist ?[facility] Oral Chemotherapy Navigation Clinics ?[phone] ?04/11/2021 8:21 AM ? ?

## 2021-04-11 NOTE — Telephone Encounter (Signed)
Oral Oncology Patient Advocate Encounter ?  ?Received notification from [employer] that prior authorization for Venclexta is required. ?  ?PA submitted on CoverMyMeds ?Key [id number]  ?Status is pending ?  ?Oral Oncology Clinic will continue to follow. ? ?Dennison Nancy CPHT ?Specialty Pharmacy Patient Advocate ?[place] ?Phone [phone] ?Fax [phone] ?04/11/2021 8:50 AM ? ?

## 2021-04-12 ENCOUNTER — Encounter: Payer: Self-pay | Admitting: Hematology & Oncology

## 2021-04-12 ENCOUNTER — Other Ambulatory Visit: Payer: Self-pay | Admitting: *Deleted

## 2021-04-12 ENCOUNTER — Other Ambulatory Visit (HOSPITAL_COMMUNITY): Payer: Self-pay

## 2021-04-12 ENCOUNTER — Encounter: Payer: Self-pay | Admitting: Family

## 2021-04-12 DIAGNOSIS — C9110 Chronic lymphocytic leukemia of B-cell type not having achieved remission: Secondary | ICD-10-CM

## 2021-04-12 NOTE — Telephone Encounter (Addendum)
Oral Chemotherapy Pharmacist Encounter ? ?I spoke with patient for overview of: Venclexta (venetoclax) for the treatment of CLL in conjunction with Calquence, planned duration 2 years. ? ?Counseled patient on administration, dosing, side effects, monitoring, drug-food interactions, safe handling, storage, and disposal. ?  ?Venclexta will be administered outpatient. Lab follow-up is per Dr. Marin Olp discretion. Patient aware of importance to increase hydration while on Venclexta. Inbasket message sent to Dr. Marin Olp on 04/12/21 to see if he would like to add additional lab monitoring in interim between patient starting venetoclax and next office visit planned for 05/16/21, especially in setting of patient having elevated uric acid and renal dysfunction at baseline from most recent labs on 04/07/21. ?  ?Week 1: Patient will take Venclexta '50mg'$  tablets, 1 tablet (50 mg) by mouth once daily with food and water for 7 days. ? ?Week 2 and thereafter: Patient will take Venclexta '100mg'$  tablets, 1 tablet by mouth once daily with food and water. ? ?Patient knows to avoid grapefruit/grapefruit juice, seville oranges and star fruit while on Venclexta. ? ?Venclexta start date: 04/14/21 ? ?Adverse effects include but are not limited to: TLS, decreased blood counts, electrolyte abnormalities, diarrhea, nausea, fatigue, arthralgias/myalgias.  ?  ?Patient has anti-emetic on hand and knows to take it if nausea develops.   ?Patient will obtain anti diarrheal and alert the office of 4 or more loose stools above baseline. ?  ?Discussed importance of allopurinol while on Venclexta. Patient will take allopurinol 100 mg tablet, 1 tablet (100 mg total) by mouth daily. ? ?Reviewed with patient importance of keeping a medication schedule and plan for any missed doses. No barriers to medication adherence identified. ? ?Medication reconciliation performed and medication/allergy list updated. ? ?All questions answered. ? ?Ms. Mikaelian voiced  understanding and appreciation.  ? ?Medication education handout and medication calendar placed in mail for patient. Patient knows to call the office with questions or concerns. Oral Chemotherapy Clinic phone number provided to patient.  ? ?Leron Croak, PharmD, BCPS ?Hematology/Oncology Clinical Pharmacist ?[facility] Oral Chemotherapy Navigation Clinics ?[phone] ?04/12/2021 10:39 AM ? ?

## 2021-04-13 DIAGNOSIS — E11319 Type 2 diabetes mellitus with unspecified diabetic retinopathy without macular edema: Secondary | ICD-10-CM | POA: Diagnosis not present

## 2021-04-13 LAB — HM DIABETES EYE EXAM

## 2021-04-14 ENCOUNTER — Other Ambulatory Visit (HOSPITAL_COMMUNITY): Payer: Self-pay

## 2021-04-14 ENCOUNTER — Encounter: Payer: Self-pay | Admitting: Internal Medicine

## 2021-04-14 ENCOUNTER — Telehealth: Payer: Self-pay

## 2021-04-14 NOTE — Telephone Encounter (Signed)
Last Prolia inj 12/14/20 ?Next Prolia inj due 06/15/21 ?

## 2021-04-20 ENCOUNTER — Other Ambulatory Visit (HOSPITAL_COMMUNITY): Payer: Self-pay

## 2021-04-21 ENCOUNTER — Encounter: Payer: Self-pay | Admitting: Internal Medicine

## 2021-04-26 ENCOUNTER — Ambulatory Visit (INDEPENDENT_AMBULATORY_CARE_PROVIDER_SITE_OTHER): Payer: Medicare Other | Admitting: Cardiovascular Disease

## 2021-04-26 ENCOUNTER — Encounter: Payer: Self-pay | Admitting: Cardiovascular Disease

## 2021-04-26 VITALS — BP 138/64 | HR 66 | Ht 66.0 in | Wt 180.6 lb

## 2021-04-26 DIAGNOSIS — E1159 Type 2 diabetes mellitus with other circulatory complications: Secondary | ICD-10-CM

## 2021-04-26 DIAGNOSIS — I25118 Atherosclerotic heart disease of native coronary artery with other forms of angina pectoris: Secondary | ICD-10-CM | POA: Diagnosis not present

## 2021-04-26 DIAGNOSIS — E785 Hyperlipidemia, unspecified: Secondary | ICD-10-CM | POA: Diagnosis not present

## 2021-04-26 DIAGNOSIS — I152 Hypertension secondary to endocrine disorders: Secondary | ICD-10-CM

## 2021-04-26 DIAGNOSIS — I10 Essential (primary) hypertension: Secondary | ICD-10-CM

## 2021-04-26 DIAGNOSIS — Z951 Presence of aortocoronary bypass graft: Secondary | ICD-10-CM | POA: Diagnosis not present

## 2021-04-26 DIAGNOSIS — E1169 Type 2 diabetes mellitus with other specified complication: Secondary | ICD-10-CM | POA: Diagnosis not present

## 2021-04-26 NOTE — Assessment & Plan Note (Signed)
History of CAD status post coronary artery bypass grafting by Dr. Darcey Nora x5 back in 1997 after I performed cardiac catheterization.  Her last cath performed 09/16/2015 revealed patent graft with non-bypassed nondominant circumflex.  Medical therapy was recommended.  She did have a normal 2D echo and Myoview stress test 05/12/2019.  She gets rare chest pain. ?

## 2021-04-26 NOTE — Patient Instructions (Signed)

## 2021-04-26 NOTE — Assessment & Plan Note (Signed)
History of essential hypertension a blood pressure measured today at 138/64.  She is on amlodipine, atenolol and Avapro. ?

## 2021-04-26 NOTE — Assessment & Plan Note (Signed)
History of hyperlipidemia on statin therapy with lipid profile performed 05/05/2020 revealing total cholesterol 101, LDL 41 and HDL 28. ?

## 2021-04-26 NOTE — Progress Notes (Signed)
? ? ? ?04/26/2021 ?[place]   ?[birth year]  ?[id number] ? ?Primary Physician Colon Branch, MD ?Primary Cardiologist: Lorretta Harp MD Lupe Car[place], [place] ? ?HPI:  [name] is a 81 y.o.  mildly overweight widowed Caucasian female mother of 2 living children (son at age 75 died of an overdose), grandmother of 5 grandchildren who was a patient of Dr. Terance Ice.  I ultimately assumed her care.  I last saw her in the office 03/17/2020. Her cardiac risk factor profile is walkover treated diabetes, hypertension and hyperlipidemia. She has a strong family history of heart disease with a father who died of a myocardial infarction at age 26 and her mother at age 23. She had a heart attack in 1997 at which time I performed cardiac catheterization. Apparently I placed a balloon pump at that time and sent her to open heart surgery. Dr. Tharon Aquas Trigt performed coronary artery bypass grafting x5. She had her last catheterization performed by Dr. Rollene Fare December 2008 revealing patent grafts with normal LV function. She had a negative Myoview back in 2012. Since I saw her years ago she's had several episodes of chest pain most recent one this past Sunday which was more intense and prolonged. She had a Myoview stress test performed 08/05/15 that showed new anterolateral ischemia. She's had several episodes of chest pain since I saw her back a month ago. She underwent outpatient cardiac catheterization 09/16/15 revealing patent grafts with an unbypassed nondominant circumflex are normal LV function. Medical therapy was recommended. She really has had no significant chest pain since her heart catheterization. ?She saw [name] in the office 04/19/16 with atypical chest pain, dyspnea or lower extremity edema. A 2-D echo was ordered and was normal. 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 She did get iron infusion by Dr. Elnoria Howard . Her symptoms have somewhat  improved. ?  ?Since I saw her in the office a year ago she continues to do well.  She did have a normal 2D echo and Myoview stress test 05/12/2019.  She denies chest pain or shortness of breath. ? ?Current Meds  ?Medication Sig  ? Acalabrutinib Maleate (CALQUENCE) 100 MG TABS Take 1 tablet (100 mg) by mouth daily.  ? allopurinol (ZYLOPRIM) 100 MG tablet TAKE 1 TABLET(100 MG) BY MOUTH DAILY  ? amLODipine (NORVASC) 5 MG tablet TAKE 1 TABLET(5 MG) BY MOUTH DAILY  ? aspirin 81 MG chewable tablet Chew 1 tablet (81 mg total) by mouth 2 (two) times daily with a meal. (Patient taking differently: Chew 81 mg by mouth daily.)  ? atenolol (TENORMIN) 100 MG tablet TAKE 1 TABLET(100 MG) BY MOUTH DAILY  ? atorvastatin (LIPITOR) 20 MG tablet TAKE 1 TABLET(20 MG) BY MOUTH DAILY  ? azelastine (ASTELIN) 0.1 % nasal spray Place 2 sprays into both nostrils at bedtime as needed for rhinitis or allergies. Use in each nostril as directed  ? Blood Glucose Monitoring Suppl (ONE TOUCH ULTRA SYSTEM KIT) W/DEVICE KIT 1 kit by Does not apply route once.  ? cholecalciferol (VITAMIN D) 1000 UNITS tablet Take 1,000 Units by mouth daily.  ? clopidogrel (PLAVIX) 75 MG tablet TAKE 1 TABLET(75 MG) BY MOUTH DAILY  ? fluticasone (FLONASE) 50 MCG/ACT nasal spray SHAKE LIQUID AND USE 2 SPRAYS IN EACH NOSTRIL DAILY (Patient taking differently: daily as needed.)  ? glucose blood (ONETOUCH ULTRA) test strip USE TO TEST BLOOD SUGAR 3 TIMES DAILY Dx code  E11.65  ? insulin NPH Human (HUMULIN N,NOVOLIN N) 100 UNIT/ML injection Inject 36 units into the skin every morning and inject 24 units into the skin at bedtime.  ? insulin regular (NOVOLIN R,HUMULIN R) 100 units/mL injection Inject 12 Units into the skin 2 (two) times daily before a meal.  ? Insulin Syringe-Needle U-100 (INSULIN SYRINGE .5CC/31GX5/16") 31G X 5/16" 0.5 ML MISC USE TO INJECT INSULIN 5 TIMES PER DAY  ? irbesartan (AVAPRO) 300 MG tablet TAKE 1/2 TABLET(150 MG) BY MOUTH DAILY  ? isosorbide  mononitrate (IMDUR) 30 MG 24 hr tablet TAKE 1 TABLET BY MOUTH EVERY DAY  ? metFORMIN (GLUCOPHAGE) 500 MG tablet TAKE 1 TABLET(500 MG) BY MOUTH DAILY WITH SUPPER  ? mometasone (ELOCON) 0.1 % cream Apply 1 application topically daily. Use as directed  ? Multiple Vitamin (MULTIVITAMIN) capsule Take 1 capsule by mouth daily.  ? nitroGLYCERIN (NITROSTAT) 0.3 MG SL tablet Place 1 tablet (0.3 mg total) under the tongue every 5 (five) minutes as needed for chest pain (ER if no better after 3 tablets).  ? omeprazole (PRILOSEC) 20 MG capsule TAKE 1 CAPSULE(20 MG) BY MOUTH DAILY  ? ondansetron (ZOFRAN) 8 MG tablet Take 1 tablet (8 mg total) by mouth every 8 (eight) hours as needed for nausea or vomiting.  ? venetoclax (VENCLEXTA) 100 MG tablet Take 1 tablet (100 mg total) by mouth daily. Tablets should be swallowed whole with a meal and a full glass of water.  ? VICTOZA 18 MG/3ML SOPN ADMINISTER 1.2 MG UNDER THE SKIN DAILY  ?  ? ?Allergies  ?Allergen Reactions  ? Codeine Other (See Comments)  ?  REACTION: makes her nervous, orTylenol #3  ? Ibuprofen Other (See Comments)  ?  REACTION: nervous  ? Meperidine Hcl Nausea And Vomiting  ? Naproxen Sodium Other (See Comments)  ?  REACTION: nervous  ? Tramadol Other (See Comments)  ?  Insomnia   ? ? ?Social History  ? ?Socioeconomic History  ? Marital status: Widowed  ?  Spouse name: Not on file  ? Number of children: 2  ? Years of education: Not on file  ? Highest education level: Not on file  ?Occupational History  ? Occupation: retired-- [employer]   ?  Employer: RETIRED  ?Tobacco Use  ? Smoking status: Never  ? Smokeless tobacco: Never  ?Vaping Use  ? Vaping Use: Never used  ?Substance and Sexual Activity  ? Alcohol use: No  ?  Alcohol/week: 0.0 standard drinks  ? Drug use: No  ? Sexual activity: Not Currently  ?Other Topics Concern  ? Not on file  ?Social History Narrative  ? Lives by herself, lost husband ~ 2004 , still drives   ? 1 child in [place]  ? I child in [place]  ? ?Social  Determinants of Health  ? ?Financial Resource Strain: Not on file  ?Food Insecurity: Not on file  ?Transportation Needs: Not on file  ?Physical Activity: Not on file  ?Stress: Not on file  ?Social Connections: Not on file  ?Intimate Partner Violence: Not on file  ?  ? ?Review of Systems: ?General: negative for chills, fever, night sweats or weight changes.  ?Cardiovascular: negative for chest pain, dyspnea on exertion, edema, orthopnea, palpitations, paroxysmal nocturnal dyspnea or shortness of breath ?Dermatological: negative for rash ?Respiratory: negative for cough or wheezing ?Urologic: negative for hematuria ?Abdominal: negative for nausea, vomiting, diarrhea, bright red blood per rectum, melena, or hematemesis ?Neurologic: negative for visual changes, syncope, or dizziness ?All other systems  reviewed and are otherwise negative except as noted above. ? ? ? ?Blood pressure 138/64, pulse 66, height _0  (1.676 m), weight 180 lb 9.6 oz (81.9 kg), SpO2 99 %.  ?General appearance: alert and no distress ?Neck: no adenopathy, no carotid bruit, no JVD, supple, symmetrical, trachea midline, and thyroid not enlarged, symmetric, no tenderness/mass/nodules ?Lungs: clear to auscultation bilaterally ?Heart: regular rate and rhythm, S1, S2 normal, no murmur, click, rub or gallop ?Extremities: extremities normal, atraumatic, no cyanosis or edema ?Pulses: 2+ and symmetric ?Skin: Skin color, texture, turgor normal. No rashes or lesions ?Neurologic: Grossly normal ? ?EKG sinus rhythm at 66 with right bundle branch block.  I personally reviewed this EKG. ? ?ASSESSMENT AND PLAN:  ? ?Hyperlipidemia associated with type 2 diabetes mellitus ([place]) ?History of hyperlipidemia on statin therapy with lipid profile performed 05/05/2020 revealing total cholesterol 101, LDL 41 and HDL 28. ? ?Hypertension associated with diabetes ([place]) ?History of essential hypertension a blood pressure measured today at 138/64.  She is on amlodipine, atenolol  and Avapro. ? ?Hx of CABG ?History of CAD status post coronary artery bypass grafting by Dr. Darcey Nora x5 back in 1997 after I performed cardiac catheterization.  Her last cath performed 09/16/2015 revealed patent gra

## 2021-04-28 ENCOUNTER — Inpatient Hospital Stay: Payer: Medicare Other

## 2021-04-28 DIAGNOSIS — Z85048 Personal history of other malignant neoplasm of rectum, rectosigmoid junction, and anus: Secondary | ICD-10-CM | POA: Diagnosis not present

## 2021-04-28 DIAGNOSIS — Q998 Other specified chromosome abnormalities: Secondary | ICD-10-CM | POA: Diagnosis not present

## 2021-04-28 DIAGNOSIS — C9110 Chronic lymphocytic leukemia of B-cell type not having achieved remission: Secondary | ICD-10-CM | POA: Diagnosis not present

## 2021-04-28 DIAGNOSIS — D509 Iron deficiency anemia, unspecified: Secondary | ICD-10-CM | POA: Diagnosis not present

## 2021-04-28 DIAGNOSIS — Z79899 Other long term (current) drug therapy: Secondary | ICD-10-CM | POA: Diagnosis not present

## 2021-04-28 DIAGNOSIS — Z7982 Long term (current) use of aspirin: Secondary | ICD-10-CM | POA: Diagnosis not present

## 2021-04-28 LAB — CBC WITH DIFFERENTIAL (CANCER CENTER ONLY)
Abs Immature Granulocytes: 0.1 10*3/uL — ABNORMAL HIGH (ref 0.00–0.07)
Basophils Absolute: 0 10*3/uL (ref 0.0–0.1)
Basophils Relative: 0 %
Eosinophils Absolute: 0 10*3/uL (ref 0.0–0.5)
Eosinophils Relative: 0 %
HCT: 33.3 % — ABNORMAL LOW (ref 36.0–46.0)
Hemoglobin: 10.7 g/dL — ABNORMAL LOW (ref 12.0–15.0)
Immature Granulocytes: 2 %
Lymphocytes Relative: 29 %
Lymphs Abs: 1.7 10*3/uL (ref 0.7–4.0)
MCH: 30.7 pg (ref 26.0–34.0)
MCHC: 32.1 g/dL (ref 30.0–36.0)
MCV: 95.7 fL (ref 80.0–100.0)
Monocytes Absolute: 0.8 10*3/uL (ref 0.1–1.0)
Monocytes Relative: 14 %
Neutro Abs: 3.2 10*3/uL (ref 1.7–7.7)
Neutrophils Relative %: 55 %
Platelet Count: 135 10*3/uL — ABNORMAL LOW (ref 150–400)
RBC: 3.48 MIL/uL — ABNORMAL LOW (ref 3.87–5.11)
RDW: 15 % (ref 11.5–15.5)
WBC Count: 5.8 10*3/uL (ref 4.0–10.5)
nRBC: 0 % (ref 0.0–0.2)

## 2021-04-28 LAB — COMPREHENSIVE METABOLIC PANEL
ALT: 14 U/L (ref 0–44)
AST: 18 U/L (ref 15–41)
Albumin: 3.8 g/dL (ref 3.5–5.0)
Alkaline Phosphatase: 89 U/L (ref 38–126)
Anion gap: 8 (ref 5–15)
BUN: 25 mg/dL — ABNORMAL HIGH (ref 8–23)
CO2: 25 mmol/L (ref 22–32)
Calcium: 8.6 mg/dL — ABNORMAL LOW (ref 8.9–10.3)
Chloride: 110 mmol/L (ref 98–111)
Creatinine, Ser: 1.43 mg/dL — ABNORMAL HIGH (ref 0.44–1.00)
GFR, Estimated: 37 mL/min — ABNORMAL LOW (ref >60)
Glucose, Bld: 157 mg/dL — ABNORMAL HIGH (ref 70–99)
Potassium: 4.5 mmol/L (ref 3.5–5.1)
Sodium: 143 mmol/L (ref 135–145)
Total Bilirubin: 0.4 mg/dL (ref 0.3–1.2)
Total Protein: 6.3 g/dL — ABNORMAL LOW (ref 6.5–8.1)

## 2021-04-29 ENCOUNTER — Other Ambulatory Visit (HOSPITAL_COMMUNITY): Payer: Self-pay

## 2021-04-29 DIAGNOSIS — Q141 Congenital malformation of retina: Secondary | ICD-10-CM | POA: Diagnosis not present

## 2021-04-29 DIAGNOSIS — E113312 Type 2 diabetes mellitus with moderate nonproliferative diabetic retinopathy with macular edema, left eye: Secondary | ICD-10-CM | POA: Diagnosis not present

## 2021-04-29 DIAGNOSIS — H43813 Vitreous degeneration, bilateral: Secondary | ICD-10-CM | POA: Diagnosis not present

## 2021-04-29 DIAGNOSIS — E113212 Type 2 diabetes mellitus with mild nonproliferative diabetic retinopathy with macular edema, left eye: Secondary | ICD-10-CM | POA: Diagnosis not present

## 2021-05-02 ENCOUNTER — Other Ambulatory Visit (HOSPITAL_COMMUNITY): Payer: Self-pay

## 2021-05-05 ENCOUNTER — Other Ambulatory Visit: Payer: Self-pay

## 2021-05-05 ENCOUNTER — Other Ambulatory Visit: Payer: Self-pay | Admitting: Cardiovascular Disease

## 2021-05-05 MED ORDER — ISOSORBIDE MONONITRATE ER 30 MG PO TB24: 30.0000 mg | ORAL_TABLET | Freq: Every day | ORAL | 3 refills | Status: DC

## 2021-05-09 ENCOUNTER — Other Ambulatory Visit: Payer: Self-pay | Admitting: Cardiovascular Disease

## 2021-05-09 ENCOUNTER — Other Ambulatory Visit (HOSPITAL_COMMUNITY): Payer: Self-pay

## 2021-05-12 ENCOUNTER — Other Ambulatory Visit (HOSPITAL_COMMUNITY): Payer: Self-pay

## 2021-05-16 ENCOUNTER — Inpatient Hospital Stay: Payer: Medicare Other | Attending: Hematology & Oncology

## 2021-05-16 ENCOUNTER — Inpatient Hospital Stay (HOSPITAL_BASED_OUTPATIENT_CLINIC_OR_DEPARTMENT_OTHER): Payer: Medicare Other | Admitting: Hematology & Oncology

## 2021-05-16 ENCOUNTER — Encounter: Payer: Self-pay | Admitting: Hematology & Oncology

## 2021-05-16 VITALS — BP 158/66 | HR 68 | Temp 97.8°F | Resp 20 | Wt 182.0 lb

## 2021-05-16 DIAGNOSIS — C9110 Chronic lymphocytic leukemia of B-cell type not having achieved remission: Secondary | ICD-10-CM

## 2021-05-16 DIAGNOSIS — Z85038 Personal history of other malignant neoplasm of large intestine: Secondary | ICD-10-CM | POA: Diagnosis not present

## 2021-05-16 DIAGNOSIS — Z7969 Long term (current) use of other immunomodulators and immunosuppressants: Secondary | ICD-10-CM | POA: Diagnosis not present

## 2021-05-16 DIAGNOSIS — D509 Iron deficiency anemia, unspecified: Secondary | ICD-10-CM | POA: Insufficient documentation

## 2021-05-16 DIAGNOSIS — C9111 Chronic lymphocytic leukemia of B-cell type in remission: Secondary | ICD-10-CM | POA: Diagnosis present

## 2021-05-16 LAB — CMP (CANCER CENTER ONLY)
ALT: 15 U/L (ref 0–44)
AST: 22 U/L (ref 15–41)
Albumin: 4.1 g/dL (ref 3.5–5.0)
Alkaline Phosphatase: 71 U/L (ref 38–126)
Anion gap: 7 (ref 5–15)
BUN: 17 mg/dL (ref 8–23)
CO2: 26 mmol/L (ref 22–32)
Calcium: 9.2 mg/dL (ref 8.9–10.3)
Chloride: 108 mmol/L (ref 98–111)
Creatinine: 1.27 mg/dL — ABNORMAL HIGH (ref 0.44–1.00)
GFR, Estimated: 42 mL/min — ABNORMAL LOW (ref >60)
Glucose, Bld: 122 mg/dL — ABNORMAL HIGH (ref 70–99)
Potassium: 4 mmol/L (ref 3.5–5.1)
Sodium: 141 mmol/L (ref 135–145)
Total Bilirubin: 0.5 mg/dL (ref 0.3–1.2)
Total Protein: 6.1 g/dL — ABNORMAL LOW (ref 6.5–8.1)

## 2021-05-16 LAB — CBC WITH DIFFERENTIAL (CANCER CENTER ONLY)
Abs Immature Granulocytes: 0.08 10*3/uL — ABNORMAL HIGH (ref 0.00–0.07)
Basophils Absolute: 0 10*3/uL (ref 0.0–0.1)
Basophils Relative: 0 %
Eosinophils Absolute: 0 10*3/uL (ref 0.0–0.5)
Eosinophils Relative: 0 %
HCT: 34.8 % — ABNORMAL LOW (ref 36.0–46.0)
Hemoglobin: 11.1 g/dL — ABNORMAL LOW (ref 12.0–15.0)
Immature Granulocytes: 2 %
Lymphocytes Relative: 26 %
Lymphs Abs: 1.4 10*3/uL (ref 0.7–4.0)
MCH: 31.2 pg (ref 26.0–34.0)
MCHC: 31.9 g/dL (ref 30.0–36.0)
MCV: 97.8 fL (ref 80.0–100.0)
Monocytes Absolute: 0.4 10*3/uL (ref 0.1–1.0)
Monocytes Relative: 8 %
Neutro Abs: 3.5 10*3/uL (ref 1.7–7.7)
Neutrophils Relative %: 64 %
Platelet Count: 110 10*3/uL — ABNORMAL LOW (ref 150–400)
RBC: 3.56 MIL/uL — ABNORMAL LOW (ref 3.87–5.11)
RDW: 15.5 % (ref 11.5–15.5)
WBC Count: 5.4 10*3/uL (ref 4.0–10.5)
nRBC: 0 % (ref 0.0–0.2)

## 2021-05-16 LAB — LACTATE DEHYDROGENASE: LDH: 292 U/L — ABNORMAL HIGH (ref 98–192)

## 2021-05-16 LAB — SAVE SMEAR(SSMR), FOR PROVIDER SLIDE REVIEW

## 2021-05-16 LAB — URIC ACID: Uric Acid, Serum: 5.3 mg/dL (ref 2.5–7.1)

## 2021-05-16 NOTE — Progress Notes (Signed)
?Hematology and Oncology Follow Up Visit ? ?[place] ?[id number] ?1940-10-31 81 y.o. ?05/16/2021 ? ? 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 ?Current Therapy:  ?Rituxan/Acalabrutinib -- s/p cycle 6-- start on 09/15/2020  ?Venetoclax 50 mg po q day x 7 days, then 100 mg po q day ?Calquence/Venetoclax -- start on 04/21/2021 ?IV iron as indicated-patient received a dose in 01/2019 ?  ?Interim History: [name] is here today for follow-up.  She now is on Calquence/venetoclax.  She is doing well with this.  She does have a bit of a cough.  It is dry.  There is no obvious shortness of breath..  She did have a very nice Easter.  She is a very nice Mother's Day. ? ?She has had no issues with diarrhea.  There is been no nausea or vomiting.  She has had no rashes.  There is been no leg swelling.  She has had no headache. ? ?Currently, I would have to say that [name] performance status is probably ECOG 1.      ? ?Medications:  ?Allergies as of 05/16/2021   ? ?   Reactions  ? Codeine Other (See Comments)  ? REACTION: makes [name] nervous, orTylenol #3  ? Ibuprofen Other (See Comments)  ? REACTION: nervous  ? Meperidine Hcl Nausea And Vomiting  ? Naproxen Sodium Other (See Comments)  ? REACTION: nervous  ? Tramadol Other (See Comments)  ? Insomnia   ? ?  ? ?  ?Medication List  ?  ? ?  ? Accurate as of May 16, 2021 12:07 PM. If you have any questions, ask your nurse or doctor.  ?  ?  ? ?  ? ?STOP taking these medications   ? ?allopurinol 100 MG tablet ?Commonly known as: ZYLOPRIM ?Stopped by: [name], MD ?  ? ?  ? ?TAKE these medications   ? ?amLODipine 5 MG tablet ?Commonly known as: NORVASC ?TAKE 1 TABLET(5 MG) BY MOUTH DAILY ?  ?aspirin 81 MG chewable tablet ?Chew 1 tablet (81 mg total) by mouth 2 (two) times daily with a meal. ?What changed: when to take this ?  ?atenolol 100 MG tablet ?Commonly known as: TENORMIN ?TAKE 1 TABLET(100  MG) BY MOUTH DAILY ?  ?atorvastatin 20 MG tablet ?Commonly known as: LIPITOR ?TAKE 1 TABLET(20 MG) BY MOUTH DAILY ?  ?azelastine 0.1 % nasal spray ?Commonly known as: ASTELIN ?Place 2 sprays into both nostrils at bedtime as needed for rhinitis or allergies. Use in each nostril as directed ?  ?Calquence 100 MG Tabs ?Generic drug: Acalabrutinib Maleate ?Take 1 tablet (100 mg) by mouth daily. ?  ?cholecalciferol 1000 units tablet ?Commonly known as: VITAMIN D ?Take 1,000 Units by mouth daily. ?  ?clopidogrel 75 MG tablet ?Commonly known as: PLAVIX ?TAKE 1 TABLET(75 MG) BY MOUTH DAILY ?  ?fluticasone 50 MCG/ACT nasal spray ?Commonly known as: FLONASE ?SHAKE LIQUID AND USE 2 SPRAYS IN EACH NOSTRIL DAILY ?What changed: See the new instructions. ?  ?insulin NPH Human 100 UNIT/ML injection ?Commonly known as: NOVOLIN N ?Inject 36 units into the skin every morning and inject 24 units into the skin at bedtime. ?  ?insulin regular 100 units/mL injection ?Commonly known as: NOVOLIN R ?Inject 12 Units into the skin 2 (two) times daily before a meal. ?  ?INSULIN SYRINGE .5CC/31GX5/16" 31G X 5/16" 0.5 ML Misc ?USE TO INJECT INSULIN 5 TIMES PER DAY ?  ?  irbesartan 300 MG tablet ?Commonly known as: AVAPRO ?TAKE 1/2 TABLET(150 MG) BY MOUTH DAILY ?  ?isosorbide mononitrate 30 MG 24 hr tablet ?Commonly known as: IMDUR ?Take 1 tablet (30 mg total) by mouth daily. ?  ?metFORMIN 500 MG tablet ?Commonly known as: GLUCOPHAGE ?TAKE 1 TABLET(500 MG) BY MOUTH DAILY WITH SUPPER ?  ?mometasone 0.1 % cream ?Commonly known as: ELOCON ?Apply 1 application topically daily. Use as directed ?  ?multivitamin capsule ?Take 1 capsule by mouth daily. ?  ?nitroGLYCERIN 0.3 MG SL tablet ?Commonly known as: Nitrostat ?Place 1 tablet (0.3 mg total) under the tongue every 5 (five) minutes as needed for chest pain (ER if no better after 3 tablets). ?  ?omeprazole 20 MG capsule ?Commonly known as: PRILOSEC ?TAKE 1 CAPSULE(20 MG) BY MOUTH DAILY ?  ?ondansetron 8  MG tablet ?Commonly known as: ZOFRAN ?Take 1 tablet (8 mg total) by mouth every 8 (eight) hours as needed for nausea or vomiting. ?  ?ONE TOUCH ULTRA SYSTEM KIT w/Device Kit ?1 kit by Does not apply route once. ?  ?OneTouch Ultra test strip ?Generic drug: glucose blood ?USE TO TEST BLOOD SUGAR 3 TIMES DAILY Dx code E11.65 ?  ?Venclexta 100 MG tablet ?Generic drug: venetoclax ?Take 1 tablet (100 mg total) by mouth daily. Tablets should be swallowed whole with a meal and a full glass of water. ?  ?Victoza 18 MG/3ML Sopn ?Generic drug: liraglutide ?ADMINISTER 1.2 MG UNDER THE SKIN DAILY ?  ? ?  ? ? ?Allergies:  ?Allergies  ?Allergen Reactions  ? Codeine Other (See Comments)  ?  REACTION: makes [name] nervous, orTylenol #3  ? Ibuprofen Other (See Comments)  ?  REACTION: nervous  ? Meperidine Hcl Nausea And Vomiting  ? Naproxen Sodium Other (See Comments)  ?  REACTION: nervous  ? Tramadol Other (See Comments)  ?  Insomnia   ? ? ?Past Medical History, Surgical history, Social history, and Family History were reviewed and updated. ? ?Review of Systems: ?Review of Systems  ?Constitutional: Negative.   ?HENT: Negative.    ?Eyes: Negative.   ?Respiratory: Negative.    ?Cardiovascular: Negative.   ?Gastrointestinal: Negative.   ?Genitourinary: Negative.   ?Musculoskeletal: Negative.   ?Skin: Negative.   ?Neurological: Negative.   ?Endo/Heme/Allergies: Negative.   ?Psychiatric/Behavioral: Negative.    ? ? ?Physical Exam: ? weight is 182 lb 0.6 oz (82.6 kg). [name] oral temperature is 97.8 ?F (36.6 ?C). [name] blood pressure is 158/66 (abnormal) and [name] pulse is 68. [name] respiration is 20 and oxygen saturation is 100%.  ? ?Wt Readings from Last 3 Encounters:  ?05/16/21 182 lb 0.6 oz (82.6 kg)  ?04/26/21 180 lb 9.6 oz (81.9 kg)  ?04/07/21 179 lb 1.3 oz (81.2 kg)  ? ? ?Physical Exam ?Vitals reviewed.  ?HENT:  ?   Head: Normocephalic and atraumatic.  ?Eyes:  ?   Pupils: Pupils are equal, round, and reactive to light.  ?Cardiovascular:  ?    Rate and Rhythm: Normal rate and regular rhythm.  ?   Heart sounds: Normal heart sounds.  ?Pulmonary:  ?   Effort: Pulmonary effort is normal.  ?   Breath sounds: Normal breath sounds.  ?Abdominal:  ?   General: Bowel sounds are normal.  ?   Palpations: Abdomen is soft.  ?Musculoskeletal:     ?   General: No tenderness or deformity. Normal range of motion.  ?   Cervical back: Normal range of motion.  ?Lymphadenopathy:  ?   Cervical:  No cervical adenopathy.  ?Skin: ?   General: Skin is warm and dry.  ?   Findings: No erythema or rash.  ?Neurological:  ?   Mental Status: She is alert and oriented to person, place, and time.  ?Psychiatric:     ?   Behavior: Behavior normal.     ?   Thought Content: Thought content normal.     ?   Judgment: Judgment normal.  ? ? ? ?Lab Results  ?Component Value Date  ? WBC 5.4 05/16/2021  ? HGB 11.1 (L) 05/16/2021  ? HCT 34.8 (L) 05/16/2021  ? MCV 97.8 05/16/2021  ? PLT 110 (L) 05/16/2021  ? ?Lab Results  ?Component Value Date  ? FERRITIN 90 08/24/2020  ? IRON 81 08/24/2020  ? TIBC 310 08/24/2020  ? UIBC 229 08/24/2020  ? IRONPCTSAT 26 08/24/2020  ? ?Lab Results  ?Component Value Date  ? RETICCTPCT 1.9 08/24/2020  ? RBC 3.56 (L) 05/16/2021  ? ?Lab Results  ?Component Value Date  ? KPAFRELGTCHN 40.8 (H) 11/18/2020  ? LAMBDASER 29.3 (H) 11/18/2020  ? KAPLAMBRATIO 1.39 11/18/2020  ? ?Lab Results  ?Component Value Date  ? IGGSERUM 733 02/16/2021  ? IGA 149 02/16/2021  ? IGMSERUM 21 (L) 02/16/2021  ? ?Lab Results  ?Component Value Date  ? TOTALPROTELP 6.5 11/18/2020  ? ALBUMINELP 3.6 11/18/2020  ? A1GS 0.3 11/18/2020  ? A2GS 0.9 11/18/2020  ? BETS 1.0 11/18/2020  ? BETA2SER 6.2 02/25/2014  ? GAMS 0.7 11/18/2020  ? MSPIKE Not Observed 11/18/2020  ? SPEI * 02/25/2014  ? ?  Chemistry   ?   ?Component Value Date/Time  ? NA 141 05/16/2021 0947  ? NA 147 (H) 11/03/2016 1018  ? NA 143 08/30/2016 0954  ? K 4.0 05/16/2021 0947  ? K 3.9 11/03/2016 1018  ? K 4.5 08/30/2016 0954  ? CL 108 05/16/2021  0947  ? CL 110 (H) 11/03/2016 1018  ? CO2 26 05/16/2021 0947  ? CO2 27 11/03/2016 1018  ? CO2 24 08/30/2016 0954  ? BUN 17 05/16/2021 0947  ? BUN 25 (H) 11/03/2016 1018  ? BUN 20.5 08/30/2016 0954  ? CREATIN

## 2021-05-16 NOTE — Telephone Encounter (Signed)
Pt due for next Prolia: 06/14/21. Pending OV with Dr Larose Kells 06/22/21.  ?

## 2021-05-17 LAB — IGG, IGA, IGM
IgA: 147 mg/dL (ref 64–422)
IgG (Immunoglobin G), Serum: 728 mg/dL (ref 586–1602)
IgM (Immunoglobulin M), Srm: 20 mg/dL — ABNORMAL LOW (ref 26–217)

## 2021-05-17 LAB — KAPPA/LAMBDA LIGHT CHAINS
Kappa free light chain: 32.8 mg/L — ABNORMAL HIGH (ref 3.3–19.4)
Kappa, lambda light chain ratio: 1.24 (ref 0.26–1.65)
Lambda free light chains: 26.4 mg/L — ABNORMAL HIGH (ref 5.7–26.3)

## 2021-05-17 NOTE — Telephone Encounter (Signed)
Prolia VOB initiated via [email or web address] ? ?Last Prolia inj 12/14/20 ?Next Prolia inj due 06/15/21  ? ?

## 2021-05-19 ENCOUNTER — Other Ambulatory Visit (HOSPITAL_COMMUNITY): Payer: Self-pay

## 2021-05-25 NOTE — Telephone Encounter (Signed)
Pt comes in to see Dr Larose Kells on 06/22/21- pt can receive her injection at that time.

## 2021-05-25 NOTE — Telephone Encounter (Signed)
Pt ready for scheduling on or after 06/15/21  Out-of-pocket cost due at time of visit: $0  Primary: Medicare Prolia co-insurance: 20% (approximately $276) Admin fee co-insurance: 20% (approximately $25)  Secondary: AARP Medicare Plan F Prolia co-insurance: Covers Medicare Part B co-insurance Admin fee co-insurance: Covers Medicare Part B co-insurance  Deductible:  Covered by secondary  Prior Auth: not required PA# Valid:   ** This summary of benefits is an estimation of the patient's out-of-pocket cost. Exact cost may vary based on individual plan coverage.

## 2021-05-28 ENCOUNTER — Other Ambulatory Visit: Payer: Self-pay | Admitting: Endocrinology

## 2021-06-03 ENCOUNTER — Other Ambulatory Visit: Payer: Self-pay | Admitting: Endocrinology

## 2021-06-08 ENCOUNTER — Other Ambulatory Visit (HOSPITAL_COMMUNITY): Payer: Self-pay

## 2021-06-09 ENCOUNTER — Other Ambulatory Visit (HOSPITAL_COMMUNITY): Payer: Self-pay

## 2021-06-14 ENCOUNTER — Ambulatory Visit: Payer: Medicare Other | Admitting: Internal Medicine

## 2021-06-20 ENCOUNTER — Other Ambulatory Visit (HOSPITAL_COMMUNITY): Payer: Self-pay

## 2021-06-20 ENCOUNTER — Other Ambulatory Visit: Payer: Self-pay | Admitting: Pharmacist

## 2021-06-22 ENCOUNTER — Encounter: Payer: Self-pay | Admitting: Internal Medicine

## 2021-06-22 ENCOUNTER — Telehealth: Payer: Self-pay

## 2021-06-22 ENCOUNTER — Ambulatory Visit (INDEPENDENT_AMBULATORY_CARE_PROVIDER_SITE_OTHER): Payer: Medicare Other | Admitting: Internal Medicine

## 2021-06-22 ENCOUNTER — Other Ambulatory Visit: Payer: Self-pay | Admitting: Endocrinology

## 2021-06-22 VITALS — BP 126/70 | HR 72 | Temp 97.5°F | Resp 18 | Ht 66.0 in | Wt 178.1 lb

## 2021-06-22 DIAGNOSIS — I25118 Atherosclerotic heart disease of native coronary artery with other forms of angina pectoris: Secondary | ICD-10-CM | POA: Diagnosis not present

## 2021-06-22 DIAGNOSIS — E1169 Type 2 diabetes mellitus with other specified complication: Secondary | ICD-10-CM

## 2021-06-22 DIAGNOSIS — E785 Hyperlipidemia, unspecified: Secondary | ICD-10-CM

## 2021-06-22 DIAGNOSIS — M810 Age-related osteoporosis without current pathological fracture: Secondary | ICD-10-CM

## 2021-06-22 DIAGNOSIS — I152 Hypertension secondary to endocrine disorders: Secondary | ICD-10-CM

## 2021-06-22 DIAGNOSIS — E1159 Type 2 diabetes mellitus with other circulatory complications: Secondary | ICD-10-CM | POA: Diagnosis not present

## 2021-06-22 LAB — LIPID PANEL
Cholesterol: 120 mg/dL (ref 0–200)
HDL: 36.2 mg/dL — ABNORMAL LOW (ref >39.00)
NonHDL: 83.98
Total CHOL/HDL Ratio: 3
Triglycerides: 255 mg/dL — ABNORMAL HIGH (ref 0.0–149.0)
VLDL: 51 mg/dL — ABNORMAL HIGH (ref 0.0–40.0)

## 2021-06-22 LAB — TSH: TSH: 2.43 u[IU]/mL (ref 0.35–5.50)

## 2021-06-22 LAB — LDL CHOLESTEROL, DIRECT: Direct LDL: 46 mg/dL

## 2021-06-22 MED ORDER — SHINGRIX 50 MCG/0.5ML IM SUSR: 0.5000 mL | Freq: Once | INTRAMUSCULAR | 1 refills | Status: AC

## 2021-06-22 MED ORDER — ASPIRIN 81 MG PO CHEW: 81.0000 mg | CHEWABLE_TABLET | Freq: Every day | ORAL | Status: AC

## 2021-06-22 MED ORDER — DENOSUMAB 60 MG/ML ~~LOC~~ SOSY
60.0000 mg | PREFILLED_SYRINGE | Freq: Once | SUBCUTANEOUS | Status: AC
  Administered 2021-06-22: 60 mg via SUBCUTANEOUS

## 2021-06-22 NOTE — Telephone Encounter (Signed)
Please update Prolia notebook.

## 2021-06-22 NOTE — Progress Notes (Signed)
Subjective:    Patient ID: [place], female    DOB: 1940-05-16, 81 y.o.   MRN: [id number]  DOS:  06/22/2021 Type of visit - description: f/u  Since the last office visit is doing well. She did report some fatigue. When asked, admitted that she occasionally sees small amounts of red blood when she wipes after a BM. Denied dark or tarry  stools, no abdominal pain, no nausea vomiting.   Has seen multiple other doctors, chart reviewed.  Wt Readings from Last 3 Encounters:  06/22/21 178 lb 2 oz (80.8 kg)  05/16/21 182 lb 0.6 oz (82.6 kg)  04/26/21 180 lb 9.6 oz (81.9 kg)     Review of Systems See above   Past Medical History:  Diagnosis Date   Acute blood loss anemia 09/17/2015   Allergy    Anemia    Anginal pain ([place]) 2017   Anxiety    Arthritis    "hands and knees mostly" (09/16/2015)   B12 deficiency anemia    CAD (coronary artery disease)    Dr Gwenlyn Found   Cataract    bil cataracts removed   CLL (chronic lymphocytic leukemia) ([place]) 02/25/2014   Clotting disorder ([place])    Depression    DJD (degenerative joint disease)    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   Gastritis    GERD (gastroesophageal reflux disease)    Headache(784.0)    History of blood transfusion    "I"ve had 20 some; thru birth of children, loss of blood, last 2 were in ~ 1999 before my cancer surgery" (09/16/2015)   History of hiatal hernia    History of shingles    Hx of colonic polyps    Hyperlipidemia    Hypertension    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   Myocardial infarction ([place])    1997   Pneumonia    X 1   Postoperative hematoma involving circulatory system following cardiac catheterization 09/17/2015   S/P cardiac cath 09/16/15 09/17/2015   Type II diabetes mellitus ([place])    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[facility])    as a child   Venous insufficiency     Past Surgical History:  Procedure Laterality Date   [place]; 2008; 09/16/2015   CARDIAC CATHETERIZATION N/A 09/16/2015   Procedure: Right/Left Heart Cath and Coronary/Graft Angiography;  Surgeon: Lorretta Harp, MD;  Location: [facility];  Service: Cardiovascular;  Laterality: N/A;   CESAREAN SECTION  1966   COLONOSCOPY     CORONARY ARTERY BYPASS GRAFT  1997   CABG X5   EYE SURGERY     2 laser surgeries on left eye with implant   EYE SURGERY Bilateral    cataracts   FEMUR IM NAIL Left 08/28/2018   Procedure: INTRAMEDULLARY (IM) NAIL FEMORAL;  Surgeon: Rod Can, MD;  Location: [facility];  Service: Orthopedics;  Laterality: Left;   FRACTURE SURGERY     HERNIA REPAIR     LAPAROSCOPIC ASSISTED VENTRAL HERNIA REPAIR  09/2008    with incarcerated colon;  Dr. Lucia Gaskins   MOHS SURGERY  06/2016   [place] Left 11/1994   "crushed my knee"; put in a plate & 6 screws"   POLYPECTOMY     REFRACTIVE SURGERY Left 07/30/2015   resection of small bowel carcinoma  06/1998   Dr. March Rummage   SHOULDER ARTHROSCOPY W/ ROTATOR CUFF REPAIR Right 1997  SMALL INTESTINE SURGERY     TONSILLECTOMY  1960s   TOTAL KNEE ARTHROPLASTY WITH HARDWARE REMOVAL Left 12/1994    (infex, hardware removed )   TUBAL LIGATION  1966    Current Outpatient Medications  Medication Instructions   Acalabrutinib Maleate (CALQUENCE) 100 MG TABS Take 1 tablet (100 mg) by mouth daily.   amLODipine (NORVASC) 5 MG tablet TAKE 1 TABLET(5 MG) BY MOUTH DAILY   aspirin 81 mg, Oral, 2 times daily with meals   atenolol (TENORMIN) 100 MG tablet TAKE 1 TABLET(100 MG) BY MOUTH DAILY   atorvastatin (LIPITOR) 20 MG tablet TAKE 1 TABLET(20 MG) BY MOUTH DAILY   azelastine (ASTELIN) 0.1 % nasal spray 2 sprays, Each Nare, At bedtime PRN, Use in each nostril as directed   Blood Glucose Monitoring Suppl (ONE TOUCH ULTRA SYSTEM KIT) W/DEVICE KIT 1 kit, Does not apply,  Once   cholecalciferol (VITAMIN D) 1,000 Units, Oral, Daily,     clopidogrel (PLAVIX) 75 MG  tablet TAKE 1 TABLET(75 MG) BY MOUTH DAILY   fluticasone (FLONASE) 50 MCG/ACT nasal spray SHAKE LIQUID AND USE 2 SPRAYS IN EACH NOSTRIL DAILY   glucose blood (ONETOUCH ULTRA) test strip USE AS DIRECTED THREE TIMES DAILY   insulin NPH Human (HUMULIN N,NOVOLIN N) 100 UNIT/ML injection Inject 36 units into the skin every morning and inject 24 units into the skin at bedtime.   insulin regular (NOVOLIN R) 12 Units, Subcutaneous, 2 times daily before meals   Insulin Syringe-Needle U-100 (INSULIN SYRINGE .5CC/31GX5/16") 31G X 5/16" 0.5 ML MISC USE TO INJECT INSULIN 5 TIMES PER DAY   irbesartan (AVAPRO) 300 MG tablet TAKE 1/2 TABLET(150 MG) BY MOUTH DAILY   isosorbide mononitrate (IMDUR) 30 mg, Oral, Daily   metFORMIN (GLUCOPHAGE) 500 MG tablet TAKE 1 TABLET(500 MG) BY MOUTH DAILY WITH SUPPER   mometasone (ELOCON) 0.1 % cream 1 application , Topical, Daily, Use as directed   Multiple Vitamin (MULTIVITAMIN) capsule 1 capsule, Oral, Daily,     nitroGLYCERIN (NITROSTAT) 0.3 mg, Sublingual, Every 5 min PRN   omeprazole (PRILOSEC) 20 MG capsule TAKE 1 CAPSULE(20 MG) BY MOUTH DAILY   ondansetron (ZOFRAN) 8 mg, Oral, Every 8 hours PRN   Venclexta 100 mg, Oral, Daily, Tablets should be swallowed whole with a meal and a full glass of water.   VICTOZA 18 MG/3ML SOPN ADMINISTER 1.2 MG UNDER THE SKIN DAILY   Zoster Vaccine Adjuvanted [facility]) injection 0.5 mLs, Intramuscular,  Once       Objective:   Physical Exam BP 126/70   Pulse 72   Temp (!) 97.5 F (36.4 C) (Oral)   Resp 18   Ht _0  (1.676 m)   Wt 178 lb 2 oz (80.8 kg)   SpO2 97%   BMI 28.75 kg/m  General:   Well developed, NAD, BMI noted. HEENT:  Normocephalic . Face symmetric, atraumatic Lungs:  CTA B Normal respiratory effort, no intercostal retractions, no accessory muscle use. Heart: RRR,  no murmur.  Lower extremities: no pretibial edema bilaterally  Skin: Not pale. Not jaundice Neurologic:  alert & oriented X3.  Speech normal,  gait appropriate for age and unassisted Psych--  Cognition and judgment appear intact.  Cooperative with normal attention span and concentration.  Behavior appropriate. No anxious or depressed appearing.      Assessment     Assessment DM  Dr Dwyane Dee  +retinopathy  HTN Hyperlipidemia CKD: creat ~1.4  (GFR ~ 38) [place] 09-2019: No obstruction, bilateral cortical thinning and a  small renal cysts Depression/anxiety: tranxene qhs prn (takes rarely) MSK: -DJD  - Osteoporosis ---T score -0.8 on December 2017, h/o a foot FX d/t walking years ago --- (L) Hip Fx, 08/2018: started fosamax, switch to Prolia d/t CKD and diff swallowing, first dose 12-2019 Hem/Onc: Dr Marin Olp  -CLL -iron deficiency anemia: IV iron 2018 -B12 def CAD, Dr Gwenlyn Found MI 97 >> CABG, cath 12-13-2006, myoview 2012 no ischemic CP: Stress test 08/05/2015, indeterminate risk study, + lateral ischemia: Cardiac catheterization 09/16/2015: Rx medical Venous insuff GI:  Dr Silverio Decamp ---GERD, IBS, h/o Gastritis ---H/o ulcerative colitis as a child H/o shingles  BCC Dr Allyson Sabal, bx 04-2015, MOH's 06-2016   PLAN DM: Per Endo.  Saw Dr. Dwyane Dee 03-2021 HTN:BP looks very good, on amlodipine, Tenormin, irbesartan, Imdur, last BMP satisfactory with a creatinine of 1.27. Hyperlipidemia: On atorvastatin, check FLP. Osteoporosis: Prolia today, on OTC vitamin D.  Last Vit d  level satisfactory. 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 CAD  Saw cardiology April 2023, no changes in therapy recommended.  She reports take aspirin 81 mg only 1 tablet a day.  She also takes Plavix. Red blood per rectum: See ROS, occasionally see small amounts of red blood when she wipes after a BM.  Last CBC showed no anemia.  Rec observation. Social: Lives by herself, independent on all her ADLs, still drives.   Preventive care: encouraged shingrix, rx printed  RTC 6 months

## 2021-06-22 NOTE — Telephone Encounter (Signed)
-----   Message from [place], [place] sent at 06/22/2021 10:58 AM EDT ----- Regarding: Prolia Pt received Prolia today- 06/22/21.

## 2021-06-22 NOTE — Patient Instructions (Addendum)
Recommend to proceed with the following vaccines at your pharmacy:  Shingrix (shingles)- see printed prescription      GO TO THE LAB : Get the blood work     [place], [place] back for   a checkup in 6 months

## 2021-06-23 ENCOUNTER — Ambulatory Visit (INDEPENDENT_AMBULATORY_CARE_PROVIDER_SITE_OTHER): Payer: Medicare Other

## 2021-06-23 DIAGNOSIS — Z0000 Encounter for general adult medical examination without abnormal findings: Secondary | ICD-10-CM | POA: Diagnosis not present

## 2021-06-23 NOTE — Patient Instructions (Signed)
[name] , Thank you for taking time to come for your Medicare Wellness Visit. I appreciate your ongoing commitment to your health goals. Please review the following plan we discussed and let me know if I can assist you in the future.   Screening recommendations/referrals: Colonoscopy: no longer needed Mammogram: 12/02/20 due 12/02/21 Bone Density: declined Recommended yearly ophthalmology/optometry visit for glaucoma screening and checkup Recommended yearly dental visit for hygiene and checkup  Vaccinations: Influenza vaccine: up to date Pneumococcal vaccine: up to date Tdap vaccine: up to date Shingles vaccine: Due-May obtain vaccine at your local pharmacy.    Covid-19:completed  Advanced directives: yes, not on file  Conditions/risks identified: see problem list   Next appointment: Follow up in one year for your annual wellness visit.    Preventive Care 82 Years and Older, Female Preventive care refers to lifestyle choices and visits with your health care provider that can promote health and wellness. What does preventive care include? A yearly physical exam. This is also called an annual well check. Dental exams once or twice a year. Routine eye exams. Ask your health care provider how often you should have your eyes checked. Personal lifestyle choices, including: Daily care of your teeth and gums. Regular physical activity. Eating a healthy diet. Avoiding tobacco and drug use. Limiting alcohol use. Practicing safe sex. Taking low-dose aspirin every day. Taking vitamin and mineral supplements as recommended by your health care provider. What happens during an annual well check? The services and screenings done by your health care provider during your annual well check will depend on your age, overall health, lifestyle risk factors, and family history of disease. Counseling  Your health care provider may ask you questions about your: Alcohol use. Tobacco use. Drug  use. Emotional well-being. Home and relationship well-being. Sexual activity. Eating habits. History of falls. Memory and ability to understand (cognition). Work and work Statistician. Reproductive health. Screening  You may have the following tests or measurements: Height, weight, and BMI. Blood pressure. Lipid and cholesterol levels. These may be checked every 5 years, or more frequently if you are over 82 years old. Skin check. Lung cancer screening. You may have this screening every year starting at age 82 if you have a 30-pack-year history of smoking and currently smoke or have quit within the past 15 years. Fecal occult blood test (FOBT) of the stool. You may have this test every year starting at age 25. Flexible sigmoidoscopy or colonoscopy. You may have a sigmoidoscopy every 5 years or a colonoscopy every 10 years starting at age 82. Hepatitis C blood test. Hepatitis B blood test. Sexually transmitted disease (STD) testing. Diabetes screening. This is done by checking your blood sugar (glucose) after you have not eaten for a while (fasting). You may have this done every 1-3 years. Bone density scan. This is done to screen for osteoporosis. You may have this done starting at age 82. Mammogram. This may be done every 1-2 years. Talk to your health care provider about how often you should have regular mammograms. Talk with your health care provider about your test results, treatment options, and if necessary, the need for more tests. Vaccines  Your health care provider may recommend certain vaccines, such as: Influenza vaccine. This is recommended every year. Tetanus, diphtheria, and acellular pertussis (Tdap, Td) vaccine. You may need a Td booster every 10 years. Zoster vaccine. You may need this after age 60. Pneumococcal 13-valent conjugate (PCV13) vaccine. One dose is recommended after age 82.  Pneumococcal polysaccharide (PPSV23) vaccine. One dose is recommended after age  82. Talk to your health care provider about which screenings and vaccines you need and how often you need them. This information is not intended to replace advice given to you by your health care provider. Make sure you discuss any questions you have with your health care provider. Document Released: 01/15/2015 Document Revised: 09/08/2015 Document Reviewed: 10/20/2014 Elsevier Interactive Patient Education  2017 [place] Prevention in the Home Falls can cause injuries. They can happen to people of all ages. There are many things you can do to make your home safe and to help prevent falls. What can I do on the outside of my home? Regularly fix the edges of walkways and driveways and fix any cracks. Remove anything that might make you trip as you walk through a door, such as a raised step or threshold. Trim any bushes or trees on the path to your home. Use bright outdoor lighting. Clear any walking paths of anything that might make someone trip, such as rocks or tools. Regularly check to see if handrails are loose or broken. Make sure that both sides of any steps have handrails. Any raised decks and porches should have guardrails on the edges. Have any leaves, snow, or ice cleared regularly. Use sand or salt on walking paths during winter. Clean up any spills in your garage right away. This includes oil or grease spills. What can I do in the bathroom? Use night lights. Install grab bars by the toilet and in the tub and shower. Do not use towel bars as grab bars. Use non-skid mats or decals in the tub or shower. If you need to sit down in the shower, use a plastic, non-slip stool. Keep the floor dry. Clean up any water that spills on the floor as soon as it happens. Remove soap buildup in the tub or shower regularly. Attach bath mats securely with double-sided non-slip rug tape. Do not have throw rugs and other things on the floor that can make you trip. What can I do in the  bedroom? Use night lights. Make sure that you have a light by your bed that is easy to reach. Do not use any sheets or blankets that are too big for your bed. They should not hang down onto the floor. Have a firm chair that has side arms. You can use this for support while you get dressed. Do not have throw rugs and other things on the floor that can make you trip. What can I do in the kitchen? Clean up any spills right away. Avoid walking on wet floors. Keep items that you use a lot in easy-to-reach places. If you need to reach something above you, use a strong step stool that has a grab bar. Keep electrical cords out of the way. Do not use floor polish or wax that makes floors slippery. If you must use wax, use non-skid floor wax. Do not have throw rugs and other things on the floor that can make you trip. What can I do with my stairs? Do not leave any items on the stairs. Make sure that there are handrails on both sides of the stairs and use them. Fix handrails that are broken or loose. Make sure that handrails are as long as the stairways. Check any carpeting to make sure that it is firmly attached to the stairs. Fix any carpet that is loose or worn. Avoid having throw rugs at the  top or bottom of the stairs. If you do have throw rugs, attach them to the floor with carpet tape. Make sure that you have a light switch at the top of the stairs and the bottom of the stairs. If you do not have them, ask someone to add them for you. What else can I do to help prevent falls? Wear shoes that: Do not have high heels. Have rubber bottoms. Are comfortable and fit you well. Are closed at the toe. Do not wear sandals. If you use a stepladder: Make sure that it is fully opened. Do not climb a closed stepladder. Make sure that both sides of the stepladder are locked into place. Ask someone to hold it for you, if possible. Clearly mark and make sure that you can see: Any grab bars or  handrails. First and last steps. Where the edge of each step is. Use tools that help you move around (mobility aids) if they are needed. These include: Canes. Walkers. Scooters. Crutches. Turn on the lights when you go into a dark area. Replace any light bulbs as soon as they burn out. Set up your furniture so you have a clear path. Avoid moving your furniture around. If any of your floors are uneven, fix them. If there are any pets around you, be aware of where they are. Review your medicines with your doctor. Some medicines can make you feel dizzy. This can increase your chance of falling. Ask your doctor what other things that you can do to help prevent falls. This information is not intended to replace advice given to you by your health care provider. Make sure you discuss any questions you have with your health care provider. Document Released: 10/15/2008 Document Revised: 05/27/2015 Document Reviewed: 01/23/2014 Elsevier Interactive Patient Education  2017 [employer].

## 2021-06-23 NOTE — Assessment & Plan Note (Signed)
DM: Per Endo.  Saw Dr. Dwyane Dee 03-2021 HTN:BP looks very good, on amlodipine, Tenormin, irbesartan, Imdur, last BMP satisfactory with a creatinine of 1.27. Hyperlipidemia: On atorvastatin, check FLP. Osteoporosis: Prolia today, on OTC vitamin D.  Last Vit d  level satisfactory. 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 CAD  Saw cardiology April 2023, no changes in therapy recommended.  She reports take aspirin 81 mg only 1 tablet a day.  She also takes Plavix. Red blood per rectum: See ROS, occasionally see small amounts of red blood when she wipes after a BM.  Last CBC showed no anemia.  Rec observation. Social: Lives by herself, independent on all her ADLs, still drives.   Preventive care: encouraged shingrix, rx printed  RTC 6 months

## 2021-06-28 ENCOUNTER — Other Ambulatory Visit (INDEPENDENT_AMBULATORY_CARE_PROVIDER_SITE_OTHER): Payer: Medicare Other

## 2021-06-28 ENCOUNTER — Telehealth: Payer: Self-pay

## 2021-06-28 DIAGNOSIS — E1165 Type 2 diabetes mellitus with hyperglycemia: Secondary | ICD-10-CM | POA: Diagnosis not present

## 2021-06-28 DIAGNOSIS — Z794 Long term (current) use of insulin: Secondary | ICD-10-CM | POA: Diagnosis not present

## 2021-06-28 LAB — BASIC METABOLIC PANEL
BUN: 21 mg/dL (ref 6–23)
CO2: 26 mEq/L (ref 19–32)
Calcium: 8.8 mg/dL (ref 8.4–10.5)
Chloride: 110 mEq/L (ref 96–112)
Creatinine, Ser: 1.39 mg/dL — ABNORMAL HIGH (ref 0.40–1.20)
GFR: 35.51 mL/min — ABNORMAL LOW (ref >60.00)
Glucose, Bld: 124 mg/dL — ABNORMAL HIGH (ref 70–99)
Potassium: 4.5 mEq/L (ref 3.5–5.1)
Sodium: 143 mEq/L (ref 135–145)

## 2021-06-28 LAB — HEMOGLOBIN A1C: Hgb A1c MFr Bld: 5.9 % (ref 4.6–6.5)

## 2021-06-28 NOTE — Telephone Encounter (Signed)
Tried cellphone. Unable to leave vm because it was full.

## 2021-06-29 LAB — FRUCTOSAMINE: Fructosamine: 230 umol/L (ref 0–285)

## 2021-06-30 ENCOUNTER — Ambulatory Visit (INDEPENDENT_AMBULATORY_CARE_PROVIDER_SITE_OTHER): Payer: Medicare Other | Admitting: Endocrinology

## 2021-06-30 ENCOUNTER — Encounter: Payer: Self-pay | Admitting: Endocrinology

## 2021-06-30 VITALS — BP 118/52 | HR 64 | Ht 66.0 in | Wt 179.0 lb

## 2021-06-30 DIAGNOSIS — Z794 Long term (current) use of insulin: Secondary | ICD-10-CM | POA: Diagnosis not present

## 2021-06-30 DIAGNOSIS — I10 Essential (primary) hypertension: Secondary | ICD-10-CM | POA: Diagnosis not present

## 2021-06-30 DIAGNOSIS — E782 Mixed hyperlipidemia: Secondary | ICD-10-CM

## 2021-06-30 DIAGNOSIS — E1165 Type 2 diabetes mellitus with hyperglycemia: Secondary | ICD-10-CM | POA: Diagnosis not present

## 2021-06-30 DIAGNOSIS — I25118 Atherosclerotic heart disease of native coronary artery with other forms of angina pectoris: Secondary | ICD-10-CM

## 2021-06-30 NOTE — Progress Notes (Signed)
Patient ID: [name], female   DOB: [birth year], 81 y.o.   MRN: [id number]            Reason for Appointment: Followup    History of Present Illness:          Diagnosis: Type 2 diabetes mellitus, date of diagnosis: 1998       Past history:   She was treated with metformin at diagnosis when this had been continued until 2015 Subsequently Amaryl was also added several years ago and this has been continued Her blood sugars were under fair control between 2010 and early 2013 with A1c ranging from 7.4-9.4, mostly under 8% However since 08/2011 her A1c has been mostly over 8%  Insulin was added in 2014 with sma limited bottom ll doses of Lantus and this has been progressively increased She was started on mealtime insulin on her initial consultation in 9/15 because of high postprandial readings, sometimes over 300 With adding Victoza in 02/2014 her blood sugars were somewhat better with A1c coming down below 8%  Recent history:   INSULIN regimen: Regular 12 units a.m. and 12 units before dinner.  NPH 36 units in the morning and 24 hs  Non-insulin hypoglycemic drugs the patient is taking are: Victoza 0.6 mg daily.    Current blood sugar patterns, daily management and problems identified:  Her A1c is again lower than expected at 5.9, was 6.3  She was told to adjust her suppertime insulin doses based on what she is eating but she is still taking the same dose  Also may not be always getting 30 minutes before a meal to do the injection  Although she had one blood sugar of 49 about 3 to 4 weeks ago she does not remember this and has not had any symptoms of low sugars otherwise lately  Overall recently in the last few days her blood sugars are mildly increased both fasting and at night  She does go out to eat couple of times a week but does not adjust her insulin her dose time Otherwise is generally watching her portions and carbohydrates  Blood sugars are mostly averaging about 140  or less at any given time but again checking only twice a day She did not pursue the DME supplier for CGM as instructed      Dinner is usually at 6-7 pm Bfst 10 am  Glucose monitoring:  done 1-2 times a day         Glucometer: One Touch ultra 2.       Blood Glucose readings by time of day from download for the last 2 weeks:   PRE-MEAL Fasting Lunch Dinner Bedtime Overall  Glucose range: 49-178   81-194 49-194  Mean/median: 135   145 140   POST-MEAL PC Breakfast PC Lunch PC Dinner  Glucose range:   ?  Mean/median:      prior  PRE-MEAL Fasting Lunch Dinner Bedtime Overall  Glucose range: 115-260      Mean/median: 148    161   POST-MEAL PC Breakfast PC Lunch PC Dinner  Glucose range:   104-514  Mean/median:       Self-care: The diet that the patient has been following is: none, usually eating low fat meals Meals: 2-3 meals per day          Dietician visit, most recent: never.  She saw the CDE in 02/2014               Weight history:  Wt Readings from Last 3 Encounters:  06/30/21 179 lb (81.2 kg)  06/22/21 178 lb 2 oz (80.8 kg)  05/16/21 182 lb 0.6 oz (82.6 kg)    Glycemic control:   Lab Results  Component Value Date   HGBA1C 5.9 06/28/2021   HGBA1C 6.3 03/15/2021   HGBA1C 5.9 12/09/2020   Lab Results  Component Value Date   MICROALBUR 9.5 (H) 12/09/2020   LDLCALC 41 05/05/2020   CREATININE 1.39 (H) 06/28/2021   Lab Results  Component Value Date   HGB 11.1 (L) 05/16/2021   Lab Results  Component Value Date   FRUCTOSAMINE 230 06/28/2021   FRUCTOSAMINE 228 07/30/2017   FRUCTOSAMINE 262 08/17/2015     OTHER active problems  discussed in review of systems   Allergies as of 06/30/2021       Reactions   Codeine Other (See Comments)   REACTION: makes her nervous, orTylenol #3   Ibuprofen Other (See Comments)   REACTION: nervous   Meperidine Hcl Nausea And Vomiting   Naproxen Sodium Other (See Comments)   REACTION: nervous   Tramadol Other (See  Comments)   Insomnia         Medication List        Accurate as of June 30, 2021  5:04 PM. If you have any questions, ask your nurse or doctor.          amLODipine 5 MG tablet Commonly known as: NORVASC TAKE 1 TABLET(5 MG) BY MOUTH DAILY   aspirin 81 MG chewable tablet Chew 1 tablet (81 mg total) by mouth daily.   atenolol 100 MG tablet Commonly known as: TENORMIN TAKE 1 TABLET(100 MG) BY MOUTH DAILY   atorvastatin 20 MG tablet Commonly known as: LIPITOR TAKE 1 TABLET(20 MG) BY MOUTH DAILY   azelastine 0.1 % nasal spray Commonly known as: ASTELIN Place 2 sprays into both nostrils at bedtime as needed for rhinitis or allergies. Use in each nostril as directed   Calquence 100 MG Tabs Generic drug: acalabrutinib maleate Take 1 tablet (100 mg) by mouth daily.   cholecalciferol 1000 units tablet Commonly known as: VITAMIN D Take 1,000 Units by mouth daily.   clopidogrel 75 MG tablet Commonly known as: PLAVIX TAKE 1 TABLET(75 MG) BY MOUTH DAILY   fluticasone 50 MCG/ACT nasal spray Commonly known as: FLONASE SHAKE LIQUID AND USE 2 SPRAYS IN EACH NOSTRIL DAILY What changed: See the new instructions.   insulin NPH Human 100 UNIT/ML injection Commonly known as: NOVOLIN N Inject 36 units into the skin every morning and inject 24 units into the skin at bedtime.   insulin regular 100 units/mL injection Commonly known as: NOVOLIN R Inject 12 Units into the skin 2 (two) times daily before a meal.   INSULIN SYRINGE .5CC/31GX5/16" 31G X 5/16" 0.5 ML Misc USE TO INJECT INSULIN 5 TIMES PER DAY   irbesartan 300 MG tablet Commonly known as: AVAPRO TAKE 1/2 TABLET(150 MG) BY MOUTH DAILY   isosorbide mononitrate 30 MG 24 hr tablet Commonly known as: IMDUR Take 1 tablet (30 mg total) by mouth daily.   metFORMIN 500 MG tablet Commonly known as: GLUCOPHAGE TAKE 1 TABLET(500 MG) BY MOUTH DAILY WITH SUPPER   mometasone 0.1 % cream Commonly known as: ELOCON Apply 1  application topically daily. Use as directed   multivitamin capsule Take 1 capsule by mouth daily.   nitroGLYCERIN 0.3 MG SL tablet Commonly known as: Nitrostat Place 1 tablet (0.3 mg total) under the tongue every 5 (five)  minutes as needed for chest pain (ER if no better after 3 tablets).   omeprazole 20 MG capsule Commonly known as: PRILOSEC TAKE 1 CAPSULE(20 MG) BY MOUTH DAILY   ondansetron 8 MG tablet Commonly known as: ZOFRAN Take 1 tablet (8 mg total) by mouth every 8 (eight) hours as needed for nausea or vomiting.   ONE TOUCH ULTRA SYSTEM KIT w/Device Kit 1 kit by Does not apply route once.   OneTouch Ultra test strip Generic drug: glucose blood USE AS DIRECTED THREE TIMES DAILY   Venclexta 100 MG tablet Generic drug: venetoclax Take 1 tablet (100 mg total) by mouth daily. Tablets should be swallowed whole with a meal and a full glass of water.   Victoza 18 MG/3ML Sopn Generic drug: liraglutide ADMINISTER 1.2 MG UNDER THE SKIN DAILY        Allergies:  Allergies  Allergen Reactions   Codeine Other (See Comments)    REACTION: makes her nervous, orTylenol #3   Ibuprofen Other (See Comments)    REACTION: nervous   Meperidine Hcl Nausea And Vomiting   Naproxen Sodium Other (See Comments)    REACTION: nervous   Tramadol Other (See Comments)    Insomnia     Past Medical History:  Diagnosis Date   Acute blood loss anemia 09/17/2015   Allergy    Anemia    Anginal pain ([place]) 2017   Anxiety    Arthritis    "hands and knees mostly" (09/16/2015)   B12 deficiency anemia    CAD (coronary artery disease)    Dr Gwenlyn Found   Cataract    bil cataracts removed   CLL (chronic lymphocytic leukemia) ([place]) 02/25/2014   Clotting disorder ([place])    Depression    DJD (degenerative joint disease)    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   Gastritis    GERD (gastroesophageal reflux disease)    Headache(784.0)    History of blood transfusion    "I"ve had 20 some;  thru birth of children, loss of blood, last 2 were in ~ 1999 before my cancer surgery" (09/16/2015)   History of hiatal hernia    History of shingles    Hx of colonic polyps    Hyperlipidemia    Hypertension    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   Myocardial infarction ([place])    1997   Pneumonia    X 1   Postoperative hematoma involving circulatory system following cardiac catheterization 09/17/2015   S/P cardiac cath 09/16/15 09/17/2015   Type II diabetes mellitus ([place])    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[facility])    as a child   Venous insufficiency     Past Surgical History:  Procedure Laterality Date   [place]; 2008; 09/16/2015   CARDIAC CATHETERIZATION N/A 09/16/2015   Procedure: Right/Left Heart Cath and Coronary/Graft Angiography;  Surgeon: Lorretta Harp, MD;  Location: [facility];  Service: Cardiovascular;  Laterality: N/A;   CESAREAN SECTION  1966   COLONOSCOPY     CORONARY ARTERY BYPASS GRAFT  1997   CABG X5   EYE SURGERY     2 laser surgeries on left eye with implant   EYE SURGERY Bilateral    cataracts   FEMUR IM NAIL Left 08/28/2018   Procedure: INTRAMEDULLARY (IM) NAIL FEMORAL;  Surgeon: Rod Can, MD;  Location: [facility];  Service: Orthopedics;  Laterality: Left;   FRACTURE SURGERY     HERNIA  REPAIR     LAPAROSCOPIC ASSISTED VENTRAL HERNIA REPAIR  09/2008    with incarcerated colon;  Dr. Lucia Gaskins   MOHS SURGERY  06/2016   [place] Left 11/1994   "crushed my knee"; put in a plate & 6 screws"   POLYPECTOMY     REFRACTIVE SURGERY Left 07/30/2015   resection of small bowel carcinoma  06/1998   Dr. March Rummage   SHOULDER ARTHROSCOPY W/ ROTATOR CUFF REPAIR Right [place]  1960s   TOTAL KNEE ARTHROPLASTY WITH HARDWARE REMOVAL Left 12/1994    (infex, hardware removed )   TUBAL LIGATION  1966    Family History  Problem Relation  Age of Onset   Heart disease Mother 32   Heart disease Father 22       MI   Hypertension Child    Heart attack Child    Diabetes Child    Heart disease Maternal Aunt        x 2, all deceased   Heart disease Maternal Uncle        x 4, all deceased   Emphysema Brother 79   Colon cancer Neg Hx    Breast cancer Neg Hx    Rectal cancer Neg Hx    Stomach cancer Neg Hx     Social History:  reports that she has never smoked. She has never used smokeless tobacco. She reports that she does not drink alcohol and does not use drugs.    Review of Systems         Lipids: Has had excellent control of hypercholesterolemia with long-term use of Lipitor She has a history of coronary bypass surgery.  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  She does have a high microalbumin level in the past Normal in 12/22    Lab Results  Component Value Date   CREATININE 1.39 (H) 06/28/2021   CREATININE 1.27 (H) 05/16/2021   CREATININE 1.43 (H) 04/28/2021       No history of Numbness, tingling or burning in feet   Diabetic foot exam was in 6/22 showing mild neuropathy  History of CLL: Under the care of hematologist, now is on oral chemotherapy  Lab Results  Component Value Date   WBC 5.4 05/16/2021   Lab Results  Component Value Date   HGB 11.1 (L) 05/16/2021       Physical Examination:  BP (!) 118/52   Pulse 64   Ht _0  (1.676 m)   Wt 179 lb (81.2 kg)   SpO2 98%   BMI 28.89 kg/m      Diabetes type 2, insulin requiring    See history of present illness for detailed discussion of her current blood sugar patterns, management and problems identified  Her  A1c is improved at 5.9  This is likely falsely low because of anemia or other factors  Fructosamine is also on the lower side at 230  She has been on regular and NPH insulin twice a day along with metformin and Victoza  She has more stable blood sugars since her last visit She does not appear to be interested in CGM as she did not pursue the DME supplier as requested and may not handle the CGM well with checking more than once or twice a day  RENAL insufficiency: Her creatinine is variable but relatively higher now  Likely from nephrosclerosis and relatively lower blood pressure  HYPERTENSION: Blood pressure is relatively low including standing   LIPIDS: LDL is well below 70 target  PLAN:  Again discussed the need to adjust her suppertime insulin dose based on portion size and number of starches and go up or down 2 to 3 units instead of taking 12 units every time  Encouraged her to be as active as possible Needs to sometimes check blood sugars after breakfast also No change in NPH and Victoza Continue to take 500 mg metformin at dinnertime Also check fructosamine to correlate with her A1c on next visit  Stop AMLODIPINE to avoid tendency to low blood pressures and further renal insufficiency However if she has readings over 140 at home she will call for prescription for 2.5 mg  Total visit time including counseling = 30 minutes  Patient Instructions  No amlodipine  Check blood pressure 2 times a week and if not getting more than 140 will need new prescription for 2.5 mg amlodipine  If sugars are below 80, 2 days in a row in the morning are below 130 consistently after dinner notify us     Elayne Snare 06/30/2021, 5:04 PM   Note: This office note was prepared with Dragon voice recognition system  technology. Any transcriptional errors that result from this process are unintentional.

## 2021-06-30 NOTE — Patient Instructions (Addendum)
No amlodipine  Check blood pressure 2 times a week and if not getting more than 140 will need new prescription for 2.5 mg amlodipine  If sugars are below 80, 2 days in a row in the morning are below 130 consistently after dinner notify us

## 2021-07-01 ENCOUNTER — Ambulatory Visit: Payer: Medicare Other | Admitting: Endocrinology

## 2021-07-04 ENCOUNTER — Other Ambulatory Visit (HOSPITAL_COMMUNITY): Payer: Self-pay

## 2021-07-04 IMAGING — DX DG CHEST 2V
2 series · 2 of 2 positions shown · non-contrast
Comparison: August 27, 2018.

CLINICAL DATA: Shortness of breath.

EXAM:
CHEST - 2 VIEW

[chest pa]
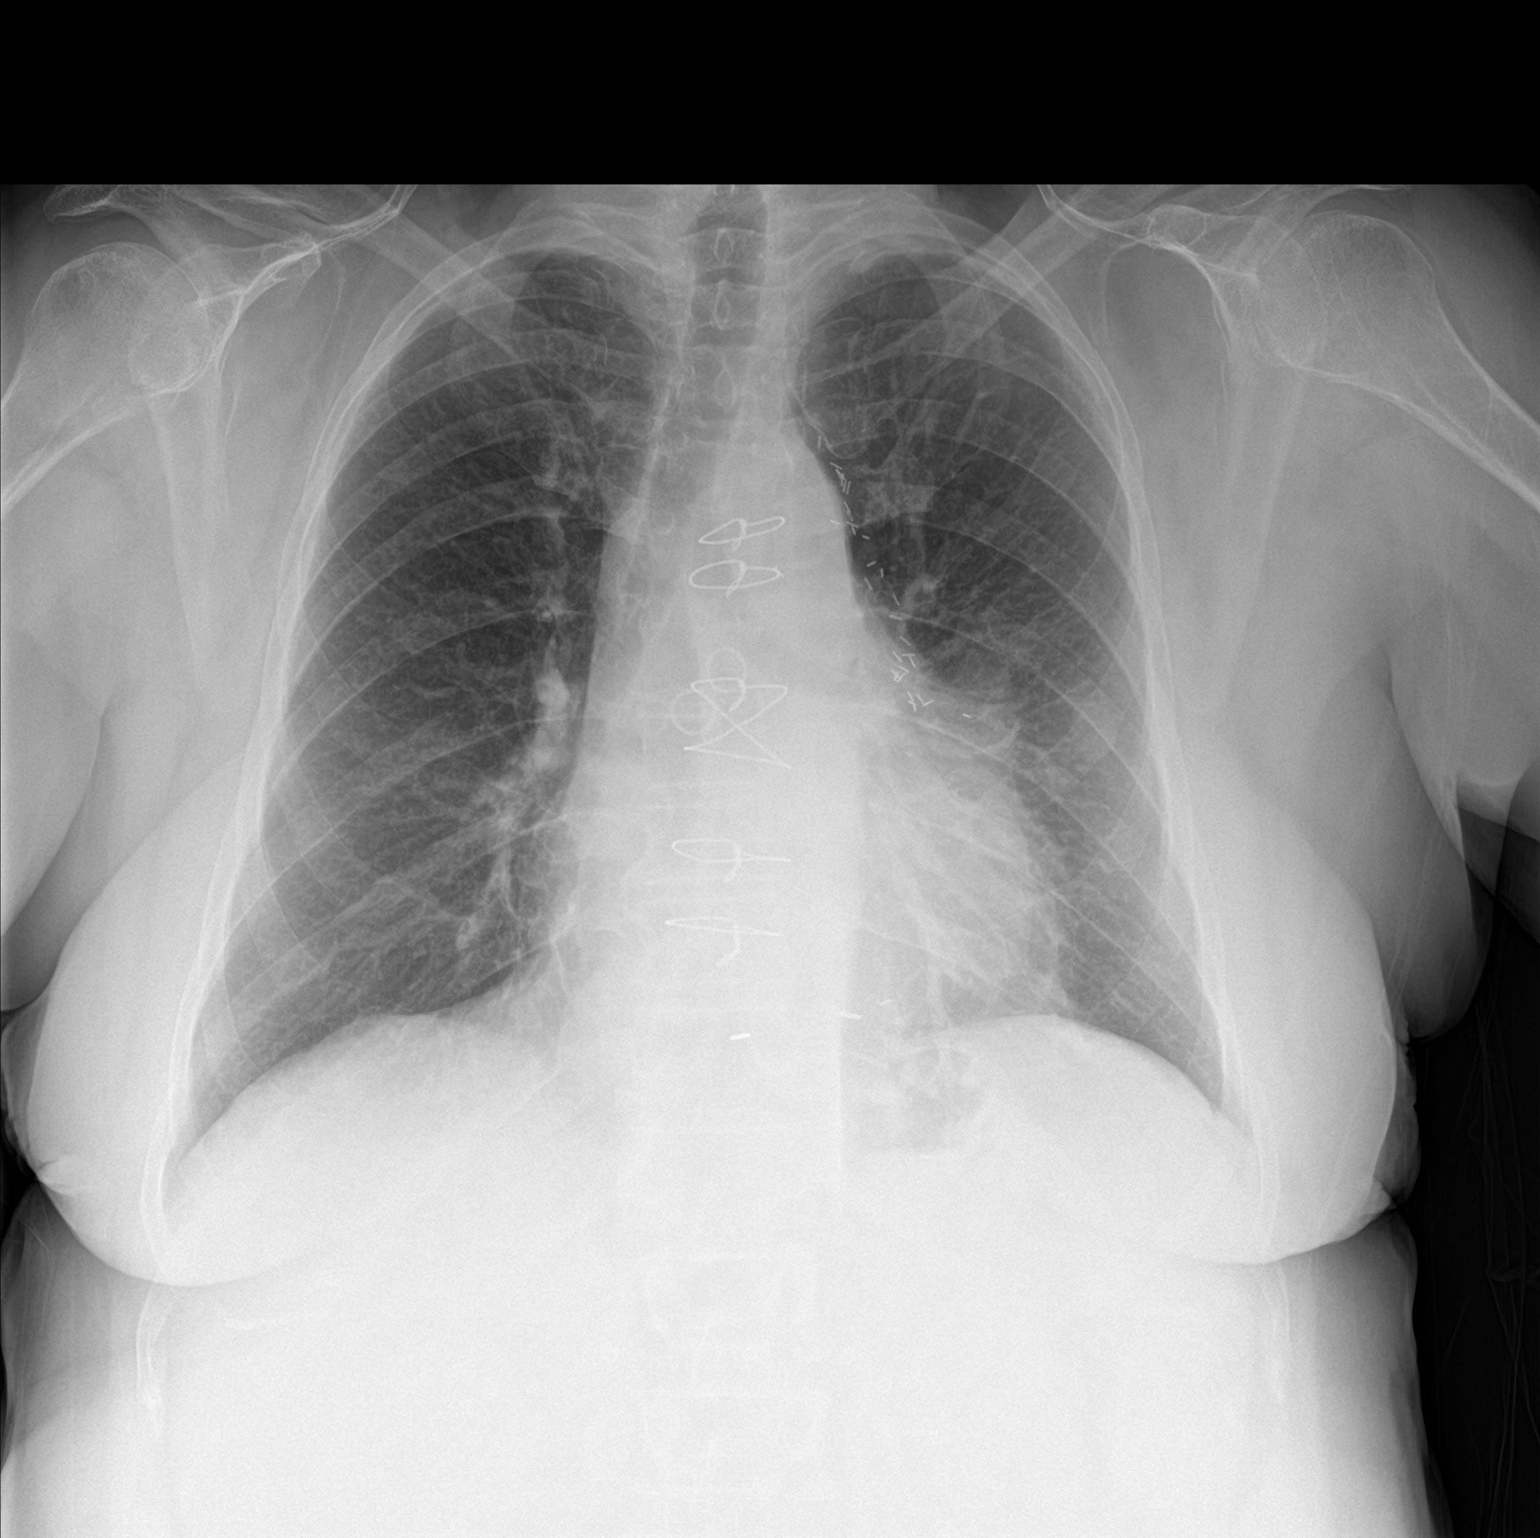

[chest lat]
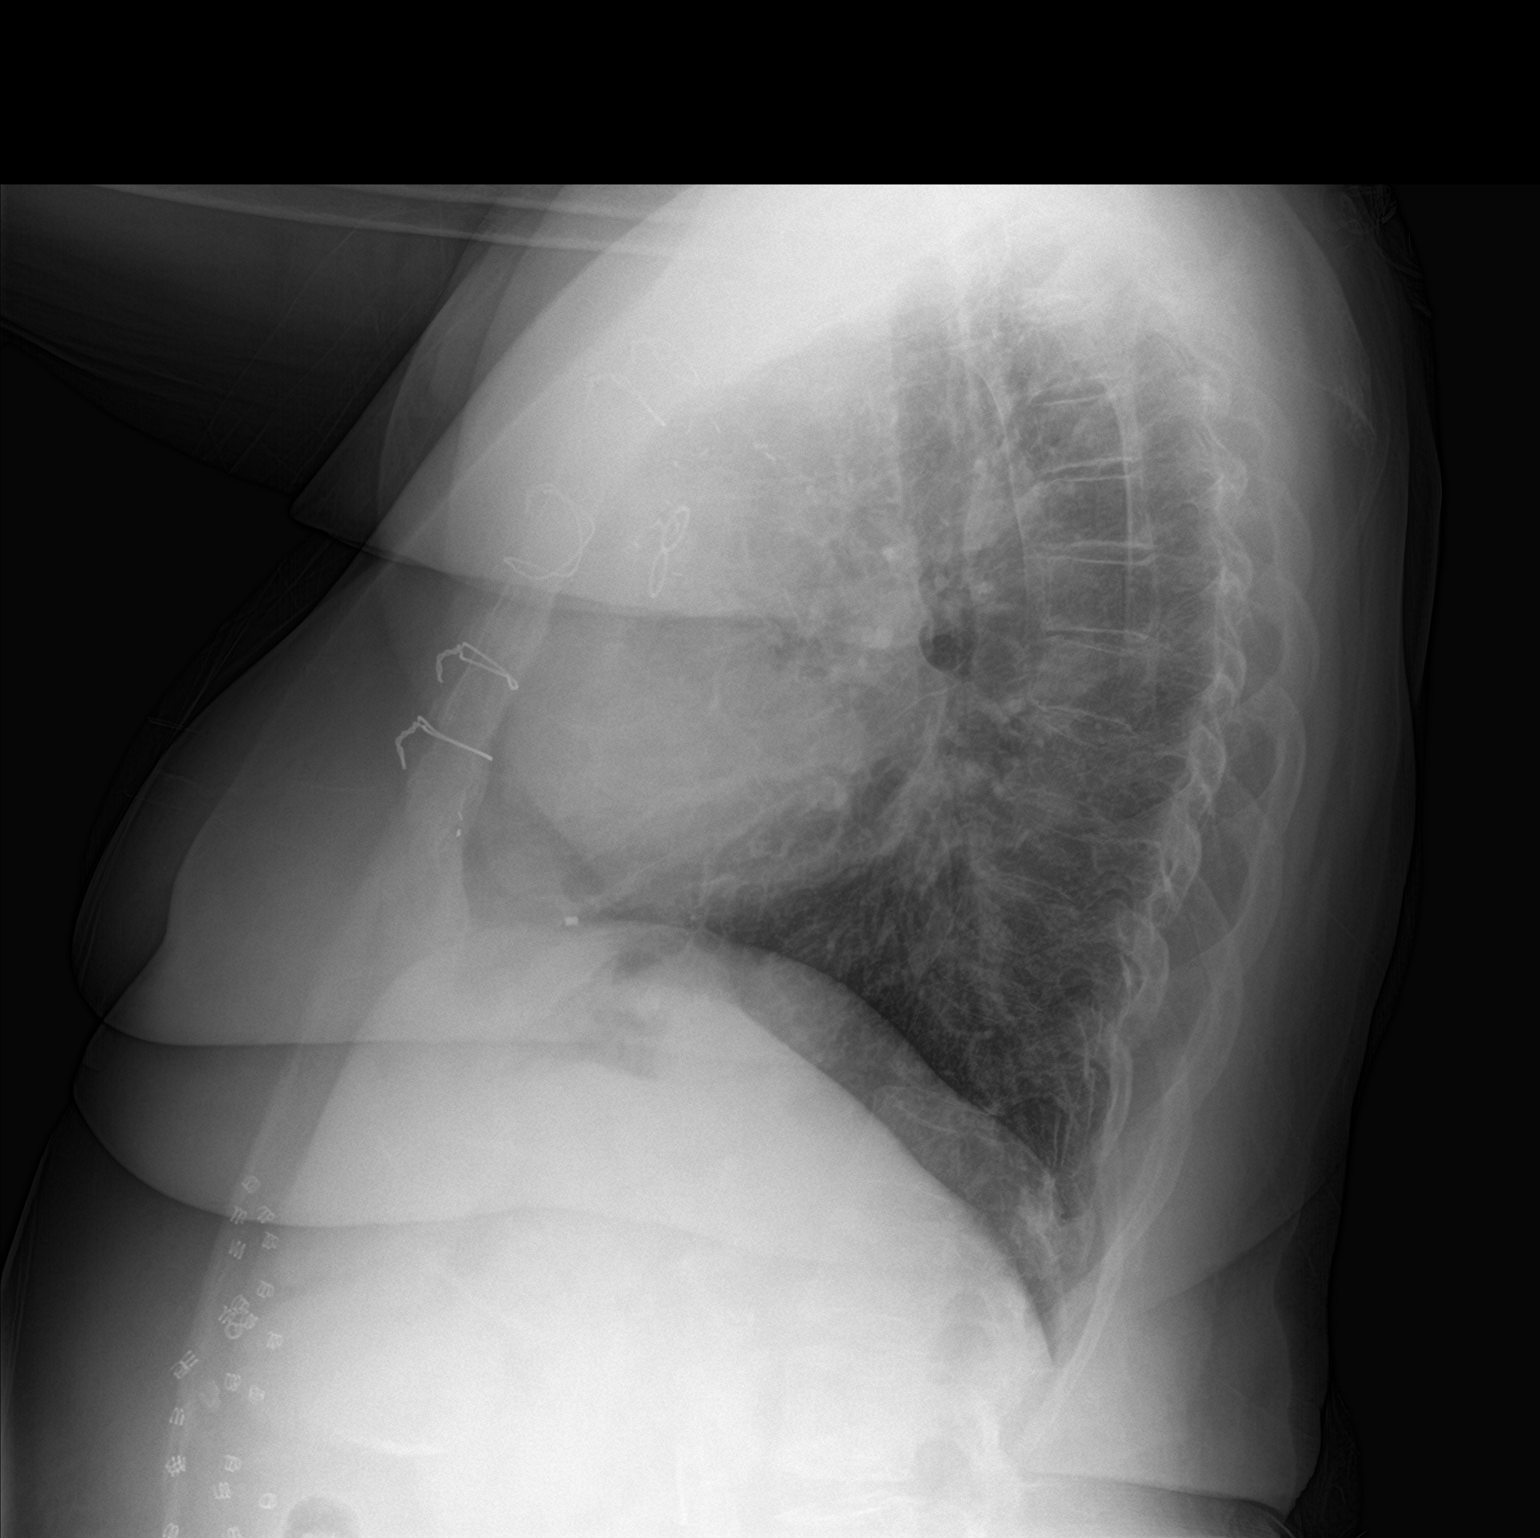

[2 of 2 positions shown; findings below may reference images not displayed]

FINDINGS: Stable cardiomediastinal silhouette. Status post coronary bypass
graft. No pneumothorax or pleural effusion is noted. Both lungs are
clear. The visualized skeletal structures are unremarkable.
IMPRESSION: No active cardiopulmonary disease.

## 2021-07-07 ENCOUNTER — Other Ambulatory Visit (HOSPITAL_COMMUNITY): Payer: Self-pay

## 2021-07-12 ENCOUNTER — Inpatient Hospital Stay (HOSPITAL_BASED_OUTPATIENT_CLINIC_OR_DEPARTMENT_OTHER): Payer: Medicare Other | Admitting: Hematology & Oncology

## 2021-07-12 ENCOUNTER — Inpatient Hospital Stay: Payer: Medicare Other | Attending: Hematology & Oncology

## 2021-07-12 ENCOUNTER — Encounter: Payer: Self-pay | Admitting: Hematology & Oncology

## 2021-07-12 VITALS — BP 175/70 | HR 70 | Temp 97.8°F | Resp 18 | Ht 66.0 in | Wt 177.2 lb

## 2021-07-12 DIAGNOSIS — C9110 Chronic lymphocytic leukemia of B-cell type not having achieved remission: Secondary | ICD-10-CM | POA: Diagnosis not present

## 2021-07-12 DIAGNOSIS — Z85038 Personal history of other malignant neoplasm of large intestine: Secondary | ICD-10-CM | POA: Diagnosis not present

## 2021-07-12 DIAGNOSIS — C679 Malignant neoplasm of bladder, unspecified: Secondary | ICD-10-CM | POA: Diagnosis not present

## 2021-07-12 DIAGNOSIS — D509 Iron deficiency anemia, unspecified: Secondary | ICD-10-CM | POA: Insufficient documentation

## 2021-07-12 DIAGNOSIS — E119 Type 2 diabetes mellitus without complications: Secondary | ICD-10-CM | POA: Diagnosis not present

## 2021-07-12 LAB — CMP (CANCER CENTER ONLY)
ALT: 16 U/L (ref 0–44)
AST: 21 U/L (ref 15–41)
Albumin: 4.3 g/dL (ref 3.5–5.0)
Alkaline Phosphatase: 71 U/L (ref 38–126)
Anion gap: 8 (ref 5–15)
BUN: 21 mg/dL (ref 8–23)
CO2: 25 mmol/L (ref 22–32)
Calcium: 9.6 mg/dL (ref 8.9–10.3)
Chloride: 111 mmol/L (ref 98–111)
Creatinine: 1.57 mg/dL — ABNORMAL HIGH (ref 0.44–1.00)
GFR, Estimated: 33 mL/min — ABNORMAL LOW (ref >60)
Glucose, Bld: 133 mg/dL — ABNORMAL HIGH (ref 70–99)
Potassium: 4.1 mmol/L (ref 3.5–5.1)
Sodium: 144 mmol/L (ref 135–145)
Total Bilirubin: 0.5 mg/dL (ref 0.3–1.2)
Total Protein: 6.3 g/dL — ABNORMAL LOW (ref 6.5–8.1)

## 2021-07-12 LAB — CBC WITH DIFFERENTIAL (CANCER CENTER ONLY)
Abs Immature Granulocytes: 0.16 10*3/uL — ABNORMAL HIGH (ref 0.00–0.07)
Basophils Absolute: 0 10*3/uL (ref 0.0–0.1)
Basophils Relative: 0 %
Eosinophils Absolute: 0 10*3/uL (ref 0.0–0.5)
Eosinophils Relative: 0 %
HCT: 33.7 % — ABNORMAL LOW (ref 36.0–46.0)
Hemoglobin: 11.4 g/dL — ABNORMAL LOW (ref 12.0–15.0)
Immature Granulocytes: 3 %
Lymphocytes Relative: 26 %
Lymphs Abs: 1.5 10*3/uL (ref 0.7–4.0)
MCH: 32.7 pg (ref 26.0–34.0)
MCHC: 33.8 g/dL (ref 30.0–36.0)
MCV: 96.6 fL (ref 80.0–100.0)
Monocytes Absolute: 0.6 10*3/uL (ref 0.1–1.0)
Monocytes Relative: 10 %
Neutro Abs: 3.4 10*3/uL (ref 1.7–7.7)
Neutrophils Relative %: 61 %
Platelet Count: 131 10*3/uL — ABNORMAL LOW (ref 150–400)
RBC: 3.49 MIL/uL — ABNORMAL LOW (ref 3.87–5.11)
RDW: 14.5 % (ref 11.5–15.5)
WBC Count: 5.5 10*3/uL (ref 4.0–10.5)
nRBC: 0 % (ref 0.0–0.2)

## 2021-07-12 LAB — LACTATE DEHYDROGENASE: LDH: 284 U/L — ABNORMAL HIGH (ref 98–192)

## 2021-07-12 LAB — SAVE SMEAR(SSMR), FOR PROVIDER SLIDE REVIEW

## 2021-07-12 NOTE — Progress Notes (Signed)
Hematology and Oncology Follow Up Visit  [place] [id number] [birth year] 82 y.o. 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  She is having a little bit of stress today.  Apparently, her brother-in-law is not doing well.  He lives down to [facility].  He has bladder cancer.  Hospice is seeing him.  It does not sound like he is going to last much longer.  I feel bad for her.  We will continue to pray hard for him.  She does complain of some fatigue.  This could only be from the Calquence/venetoclax combination.  I wish her some we could do about the fatigue.  We could always adjust the doses of the Calquence and Venetoclax.    She had a flow cytometry done on the peripheral blood back in May.  I think this showed 6% monoclonal cells.  She has had no rashes.  There has been no leg swelling.  She has had some adjustments of her blood pressure medications.  She still has diabetes.  This seems to be under fairly good control.  Overall, I would say performance status is probably ECOG 1.     Medications:  Allergies as of 07/12/2021       Reactions   Codeine Other (See Comments)   REACTION: makes her nervous, orTylenol #3   Ibuprofen Other (See Comments)   REACTION: nervous   Meperidine Hcl Nausea And Vomiting   Naproxen Sodium Other (See Comments)   REACTION: nervous   Tramadol Other (See Comments)   Insomnia         Medication List        Accurate as of July 12, 2021 10:39 AM. If you have any questions, ask your nurse or doctor.          amLODipine 5 MG tablet Commonly known as:  NORVASC TAKE 1 TABLET(5 MG) BY MOUTH DAILY   aspirin 81 MG chewable tablet Chew 1 tablet (81 mg total) by mouth daily.   atenolol 100 MG tablet Commonly known as: TENORMIN TAKE 1 TABLET(100 MG) BY MOUTH DAILY   atorvastatin 20 MG tablet Commonly known as: LIPITOR TAKE 1 TABLET(20 MG) BY MOUTH DAILY   azelastine 0.1 % nasal spray Commonly known as: ASTELIN Place 2 sprays into both nostrils at bedtime as needed for rhinitis or allergies. Use in each nostril as directed   Calquence 100 MG Tabs Generic drug: acalabrutinib maleate Take 1 tablet (100 mg) by mouth daily.   cholecalciferol 1000 units tablet Commonly known as: VITAMIN D Take 1,000 Units by mouth daily.   clopidogrel 75 MG tablet Commonly known as: PLAVIX TAKE 1 TABLET(75 MG) BY MOUTH DAILY   fluticasone 50 MCG/ACT nasal spray Commonly known as: FLONASE SHAKE LIQUID AND USE 2 SPRAYS IN EACH NOSTRIL DAILY What changed: See the new instructions.   insulin NPH Human 100 UNIT/ML injection Commonly known as: NOVOLIN N Inject 36 units into the skin every morning and inject 24 units into the skin at bedtime.   insulin regular 100 units/mL injection Commonly known as: NOVOLIN R Inject 12 Units into the  skin 2 (two) times daily before a meal.   INSULIN SYRINGE .5CC/31GX5/16" 31G X 5/16" 0.5 ML Misc USE TO INJECT INSULIN 5 TIMES PER DAY   irbesartan 300 MG tablet Commonly known as: AVAPRO TAKE 1/2 TABLET(150 MG) BY MOUTH DAILY   isosorbide mononitrate 30 MG 24 hr tablet Commonly known as: IMDUR Take 1 tablet (30 mg total) by mouth daily.   metFORMIN 500 MG tablet Commonly known as: GLUCOPHAGE TAKE 1 TABLET(500 MG) BY MOUTH DAILY WITH SUPPER   mometasone 0.1 % cream Commonly known as: ELOCON Apply 1 application topically daily. Use as directed   multivitamin capsule Take 1 capsule by mouth daily.   nitroGLYCERIN 0.3 MG SL tablet Commonly known as: Nitrostat Place 1 tablet (0.3 mg total) under the tongue  every 5 (five) minutes as needed for chest pain (ER if no better after 3 tablets).   omeprazole 20 MG capsule Commonly known as: PRILOSEC TAKE 1 CAPSULE(20 MG) BY MOUTH DAILY   ondansetron 8 MG tablet Commonly known as: ZOFRAN Take 1 tablet (8 mg total) by mouth every 8 (eight) hours as needed for nausea or vomiting.   ONE TOUCH ULTRA SYSTEM KIT w/Device Kit 1 kit by Does not apply route once.   OneTouch Ultra test strip Generic drug: glucose blood USE AS DIRECTED THREE TIMES DAILY   Venclexta 100 MG tablet Generic drug: venetoclax Take 1 tablet (100 mg total) by mouth daily. Tablets should be swallowed whole with a meal and a full glass of water.   Victoza 18 MG/3ML Sopn Generic drug: liraglutide ADMINISTER 1.2 MG UNDER THE SKIN DAILY        Allergies:  Allergies  Allergen Reactions   Codeine Other (See Comments)    REACTION: makes her nervous, orTylenol #3   Ibuprofen Other (See Comments)    REACTION: nervous   Meperidine Hcl Nausea And Vomiting   Naproxen Sodium Other (See Comments)    REACTION: nervous   Tramadol Other (See Comments)    Insomnia     Past Medical History, Surgical history, Social history, and Family History were reviewed and updated.  Review of Systems: Review of Systems  Constitutional: Negative.   HENT: Negative.    Eyes: Negative.   Respiratory: Negative.    Cardiovascular: Negative.   Gastrointestinal: Negative.   Genitourinary: Negative.   Musculoskeletal: Negative.   Skin: Negative.   Neurological: Negative.   Endo/Heme/Allergies: Negative.   Psychiatric/Behavioral: Negative.       Physical Exam:  height is 5' 6"  (1.676 m) and weight is 177 lb 4 oz (80.4 kg). Her oral temperature is 97.8 F (36.6 C). Her blood pressure is 175/70 (abnormal) and her pulse is 70. Her respiration is 18 and oxygen saturation is 100%.   Wt Readings from Last 3 Encounters:  07/12/21 177 lb 4 oz (80.4 kg)  06/30/21 179 lb (81.2 kg)  06/22/21 178  lb 2 oz (80.8 kg)    Physical Exam Vitals reviewed.  HENT:     Head: Normocephalic and atraumatic.  Eyes:     Pupils: Pupils are equal, round, and reactive to light.  Cardiovascular:     Rate and Rhythm: Normal rate and regular rhythm.     Heart sounds: Normal heart sounds.  Pulmonary:     Effort: Pulmonary effort is normal.     Breath sounds: Normal breath sounds.  Abdominal:     General: Bowel sounds are normal.     Palpations: Abdomen is soft.  Musculoskeletal:  General: No tenderness or deformity. Normal range of motion.     Cervical back: Normal range of motion.  Lymphadenopathy:     Cervical: No cervical adenopathy.  Skin:    General: Skin is warm and dry.     Findings: No erythema or rash.  Neurological:     Mental Status: She is alert and oriented to person, place, and time.  Psychiatric:        Behavior: Behavior normal.        Thought Content: Thought content normal.        Judgment: Judgment normal.     Lab Results  Component Value Date   WBC 5.5 07/12/2021   HGB 11.4 (L) 07/12/2021   HCT 33.7 (L) 07/12/2021   MCV 96.6 07/12/2021   PLT 131 (L) 07/12/2021   Lab Results  Component Value Date   FERRITIN 90 08/24/2020   IRON 81 08/24/2020   TIBC 310 08/24/2020   UIBC 229 08/24/2020   IRONPCTSAT 26 08/24/2020   Lab Results  Component Value Date   RETICCTPCT 1.9 08/24/2020   RBC 3.49 (L) 07/12/2021   Lab Results  Component Value Date   KPAFRELGTCHN 32.8 (H) 05/16/2021   LAMBDASER 26.4 (H) 05/16/2021   KAPLAMBRATIO 1.24 05/16/2021   Lab Results  Component Value Date   IGGSERUM 728 05/16/2021   IGA 147 05/16/2021   IGMSERUM 20 (L) 05/16/2021   Lab Results  Component Value Date   TOTALPROTELP 6.5 11/18/2020   ALBUMINELP 3.6 11/18/2020   A1GS 0.3 11/18/2020   A2GS 0.9 11/18/2020   BETS 1.0 11/18/2020   BETA2SER 6.2 02/25/2014   GAMS 0.7 11/18/2020   MSPIKE Not Observed 11/18/2020   SPEI * 02/25/2014     Chemistry      Component  Value Date/Time   NA 143 06/28/2021 1050   NA 147 (H) 11/03/2016 1018   NA 143 08/30/2016 0954   K 4.5 06/28/2021 1050   K 3.9 11/03/2016 1018   K 4.5 08/30/2016 0954   CL 110 06/28/2021 1050   CL 110 (H) 11/03/2016 1018   CO2 26 06/28/2021 1050   CO2 27 11/03/2016 1018   CO2 24 08/30/2016 0954   BUN 21 06/28/2021 1050   BUN 25 (H) 11/03/2016 1018   BUN 20.5 08/30/2016 0954   CREATININE 1.39 (H) 06/28/2021 1050   CREATININE 1.27 (H) 05/16/2021 0947   CREATININE 1.6 (H) 11/03/2016 1018   CREATININE 1.4 (H) 08/30/2016 0954      Component Value Date/Time   CALCIUM 8.8 06/28/2021 1050   CALCIUM 10.1 11/03/2016 1018   CALCIUM 9.8 08/30/2016 0954   ALKPHOS 71 05/16/2021 0947   ALKPHOS 112 (H) 11/03/2016 1018   ALKPHOS 113 08/30/2016 0954   AST 22 05/16/2021 0947   AST 23 08/30/2016 0954   ALT 15 05/16/2021 0947   ALT 29 11/03/2016 1018   ALT 20 08/30/2016 0954   BILITOT 0.5 05/16/2021 0947   BILITOT 0.38 08/30/2016 0954      Impression and Plan: [name] is a very pleasant 81 yo caucasian female with CLL.  So far, we have not had any negative prognostic markers.  She does have the 13q-chromosomal abnormality.  This is typically construed as a good prognostic marker.  She currently is on acalabrutinib and venetoclax.  I have to believe that this is working nicely.  We will see her back, we will see about doing another flow cytometry.  I would like to get her back now after  Labor Day.  I think this would be reasonable.  We will certainly pray for her brother-in-law.  [name], MD 7/11/202310:39 AM

## 2021-07-13 ENCOUNTER — Other Ambulatory Visit: Payer: Self-pay | Admitting: Hematology & Oncology

## 2021-07-13 LAB — IGG, IGA, IGM
IgA: 143 mg/dL (ref 64–422)
IgG (Immunoglobin G), Serum: 713 mg/dL (ref 586–1602)
IgM (Immunoglobulin M), Srm: 17 mg/dL — ABNORMAL LOW (ref 26–217)

## 2021-07-18 ENCOUNTER — Other Ambulatory Visit (HOSPITAL_COMMUNITY): Payer: Self-pay

## 2021-07-25 ENCOUNTER — Other Ambulatory Visit (HOSPITAL_COMMUNITY): Payer: Self-pay

## 2021-07-28 ENCOUNTER — Other Ambulatory Visit: Payer: Self-pay | Admitting: Endocrinology

## 2021-07-29 ENCOUNTER — Other Ambulatory Visit: Payer: Self-pay | Admitting: Endocrinology

## 2021-08-02 ENCOUNTER — Other Ambulatory Visit: Payer: Self-pay | Admitting: Internal Medicine

## 2021-08-02 ENCOUNTER — Other Ambulatory Visit: Payer: Self-pay | Admitting: Hematology & Oncology

## 2021-08-02 ENCOUNTER — Other Ambulatory Visit (HOSPITAL_COMMUNITY): Payer: Self-pay

## 2021-08-02 DIAGNOSIS — C9110 Chronic lymphocytic leukemia of B-cell type not having achieved remission: Secondary | ICD-10-CM

## 2021-08-02 MED ORDER — VENETOCLAX 100 MG PO TABS
100.0000 mg | ORAL_TABLET | Freq: Every day | ORAL | 3 refills | Status: DC
  Filled 2021-08-02: qty 28, 28d supply, fill #0
  Filled 2021-08-26: qty 28, 28d supply, fill #1
  Filled 2021-09-06: qty 28, 28d supply, fill #2
  Filled 2021-10-13: qty 28, 28d supply, fill #3

## 2021-08-04 ENCOUNTER — Other Ambulatory Visit (HOSPITAL_COMMUNITY): Payer: Self-pay

## 2021-08-05 ENCOUNTER — Telehealth: Payer: Self-pay

## 2021-08-05 NOTE — Patient Outreach (Signed)
  Care Coordination   08/05/2021 Name: [name] MRN: [id number] DOB: [birth year]   Care Coordination Outreach Attempts:  An unsuccessful telephone outreach was attempted today to offer the patient information about available care coordination services as a benefit of their health plan.   Follow Up Plan:  Additional outreach attempts will be made to offer the patient care coordination information and services.   Encounter Outcome:  No Answer  Care Coordination Interventions Activated:  No   Care Coordination Interventions:  No, not indicated    Thea Silversmith, RN, MSN, BSN, CCM Care Coordinator [phone]

## 2021-08-18 ENCOUNTER — Other Ambulatory Visit (HOSPITAL_COMMUNITY): Payer: Self-pay

## 2021-08-25 ENCOUNTER — Other Ambulatory Visit (HOSPITAL_COMMUNITY): Payer: Self-pay

## 2021-08-25 ENCOUNTER — Telehealth: Payer: Self-pay | Admitting: Pharmacy Technician

## 2021-08-25 NOTE — Telephone Encounter (Signed)
Oral Oncology Patient Advocate Encounter   Was successful in securing patient an $74,700 grant from Patient [place] [facility]) to provide copayment coverage for Calquence/Venclexta.  This will keep the out of pocket expense at $0.      The billing information is as follows and has been shared with [place].   Member ID: [id number] Group ID: [id number] RxBin: [id number] Dates of Eligibility: 08/27/2021 through 08/27/2022  Fund:  [place], [place] Patient Advocate Specialist [place] Patient Advocate Team Direct Number: [phone]  Fax: [phone]

## 2021-08-26 ENCOUNTER — Other Ambulatory Visit (HOSPITAL_COMMUNITY): Payer: Self-pay

## 2021-08-26 ENCOUNTER — Other Ambulatory Visit: Payer: Self-pay | Admitting: Internal Medicine

## 2021-09-01 ENCOUNTER — Other Ambulatory Visit (HOSPITAL_COMMUNITY): Payer: Self-pay

## 2021-09-06 ENCOUNTER — Other Ambulatory Visit: Payer: Self-pay | Admitting: Hematology & Oncology

## 2021-09-06 ENCOUNTER — Other Ambulatory Visit (HOSPITAL_COMMUNITY): Payer: Self-pay

## 2021-09-06 DIAGNOSIS — C9110 Chronic lymphocytic leukemia of B-cell type not having achieved remission: Secondary | ICD-10-CM

## 2021-09-06 MED ORDER — CALQUENCE 100 MG PO TABS
100.0000 mg | ORAL_TABLET | Freq: Every day | ORAL | 5 refills | Status: DC
  Filled 2021-09-22: qty 30, 30d supply, fill #0
  Filled 2021-10-13: qty 30, 30d supply, fill #1
  Filled 2021-11-14: qty 30, 30d supply, fill #2
  Filled 2021-12-13 – 2021-12-16 (×3): qty 30, 30d supply, fill #3
  Filled 2022-01-10: qty 30, 30d supply, fill #4
  Filled 2022-02-15: qty 30, 30d supply, fill #5

## 2021-09-16 ENCOUNTER — Other Ambulatory Visit: Payer: Self-pay

## 2021-09-16 ENCOUNTER — Inpatient Hospital Stay: Payer: Medicare Other | Attending: Hematology & Oncology

## 2021-09-16 ENCOUNTER — Encounter: Payer: Self-pay | Admitting: Hematology & Oncology

## 2021-09-16 ENCOUNTER — Inpatient Hospital Stay (HOSPITAL_BASED_OUTPATIENT_CLINIC_OR_DEPARTMENT_OTHER): Payer: Medicare Other | Admitting: Hematology & Oncology

## 2021-09-16 VITALS — BP 145/68 | HR 67 | Temp 98.0°F | Resp 18 | Ht 66.0 in | Wt 175.0 lb

## 2021-09-16 DIAGNOSIS — Z85038 Personal history of other malignant neoplasm of large intestine: Secondary | ICD-10-CM | POA: Insufficient documentation

## 2021-09-16 DIAGNOSIS — C9110 Chronic lymphocytic leukemia of B-cell type not having achieved remission: Secondary | ICD-10-CM | POA: Diagnosis not present

## 2021-09-16 DIAGNOSIS — D509 Iron deficiency anemia, unspecified: Secondary | ICD-10-CM | POA: Diagnosis not present

## 2021-09-16 DIAGNOSIS — K909 Intestinal malabsorption, unspecified: Secondary | ICD-10-CM

## 2021-09-16 DIAGNOSIS — Z7984 Long term (current) use of oral hypoglycemic drugs: Secondary | ICD-10-CM | POA: Diagnosis not present

## 2021-09-16 DIAGNOSIS — Z79899 Other long term (current) drug therapy: Secondary | ICD-10-CM | POA: Insufficient documentation

## 2021-09-16 LAB — CBC WITH DIFFERENTIAL (CANCER CENTER ONLY)
Abs Immature Granulocytes: 0.17 10*3/uL — ABNORMAL HIGH (ref 0.00–0.07)
Basophils Absolute: 0 10*3/uL (ref 0.0–0.1)
Basophils Relative: 0 %
Eosinophils Absolute: 0 10*3/uL (ref 0.0–0.5)
Eosinophils Relative: 0 %
HCT: 31.2 % — ABNORMAL LOW (ref 36.0–46.0)
Hemoglobin: 10.4 g/dL — ABNORMAL LOW (ref 12.0–15.0)
Immature Granulocytes: 3 %
Lymphocytes Relative: 30 %
Lymphs Abs: 1.5 10*3/uL (ref 0.7–4.0)
MCH: 33.1 pg (ref 26.0–34.0)
MCHC: 33.3 g/dL (ref 30.0–36.0)
MCV: 99.4 fL (ref 80.0–100.0)
Monocytes Absolute: 0.5 10*3/uL (ref 0.1–1.0)
Monocytes Relative: 10 %
Neutro Abs: 2.8 10*3/uL (ref 1.7–7.7)
Neutrophils Relative %: 57 %
Platelet Count: 104 10*3/uL — ABNORMAL LOW (ref 150–400)
RBC: 3.14 MIL/uL — ABNORMAL LOW (ref 3.87–5.11)
RDW: 14.4 % (ref 11.5–15.5)
WBC Count: 5 10*3/uL (ref 4.0–10.5)
nRBC: 0 % (ref 0.0–0.2)

## 2021-09-16 LAB — CMP (CANCER CENTER ONLY)
ALT: 15 U/L (ref 0–44)
AST: 21 U/L (ref 15–41)
Albumin: 3.9 g/dL (ref 3.5–5.0)
Alkaline Phosphatase: 59 U/L (ref 38–126)
Anion gap: 6 (ref 5–15)
BUN: 21 mg/dL (ref 8–23)
CO2: 27 mmol/L (ref 22–32)
Calcium: 9.3 mg/dL (ref 8.9–10.3)
Chloride: 110 mmol/L (ref 98–111)
Creatinine: 1.5 mg/dL — ABNORMAL HIGH (ref 0.44–1.00)
GFR, Estimated: 35 mL/min — ABNORMAL LOW (ref >60)
Glucose, Bld: 110 mg/dL — ABNORMAL HIGH (ref 70–99)
Potassium: 4.5 mmol/L (ref 3.5–5.1)
Sodium: 143 mmol/L (ref 135–145)
Total Bilirubin: 0.6 mg/dL (ref 0.3–1.2)
Total Protein: 6.3 g/dL — ABNORMAL LOW (ref 6.5–8.1)

## 2021-09-16 LAB — LACTATE DEHYDROGENASE: LDH: 263 U/L — ABNORMAL HIGH (ref 98–192)

## 2021-09-16 LAB — SAVE SMEAR(SSMR), FOR PROVIDER SLIDE REVIEW

## 2021-09-16 NOTE — Progress Notes (Signed)
Hematology and Oncology Follow Up Visit  [name] [id number] 12/05/1940 81 y.o. 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  [name] is having a little bit of stress today.  [name] son, who lives in [place], has extensive stage small cell lung cancer.  He just started chemotherapy.  [name] is worried about him.  Otherwise, [name] seems to be doing okay herself.  [name] is doing well on the Calquence/venetoclax.  [name] has had no problems with nausea or vomiting.  [name] has had no diarrhea.  [name] has had no rashes.  There is been no leg swelling.  [name] has had no cough or shortness of breath.  [name] has not noted any palpable lymph nodes.  Overall, I would say performance status is probably ECOG 1.     Medications:  Allergies as of 09/16/2021       Reactions   Codeine Other (See Comments)   REACTION: makes [name] nervous, orTylenol #3   Ibuprofen Other (See Comments)   REACTION: nervous   Meperidine Hcl Nausea And Vomiting   Naproxen Sodium Other (See Comments)   REACTION: nervous   Tramadol Other (See Comments)   Insomnia         Medication List        Accurate as of September 16, 2021 12:53 PM. If you have any questions, ask your nurse or doctor.          amLODipine 5 MG tablet Commonly known as: NORVASC TAKE 1 TABLET(5 MG) BY MOUTH DAILY   aspirin 81 MG chewable tablet Chew 1 tablet (81 mg total) by mouth daily.   atenolol 100 MG tablet Commonly known as: TENORMIN TAKE 1 TABLET(100 MG) BY MOUTH DAILY   atorvastatin 20 MG tablet Commonly known as: LIPITOR TAKE 1 TABLET(20 MG) BY MOUTH DAILY    azelastine 0.1 % nasal spray Commonly known as: ASTELIN Place 2 sprays into both nostrils at bedtime as needed for rhinitis or allergies. Use in each nostril as directed   Calquence 100 MG tablet Generic drug: acalabrutinib maleate Take 1 tablet (100 mg) by mouth daily.   cholecalciferol 1000 units tablet Commonly known as: VITAMIN D Take 1,000 Units by mouth daily.   clopidogrel 75 MG tablet Commonly known as: PLAVIX TAKE 1 TABLET(75 MG) BY MOUTH DAILY   fluticasone 50 MCG/ACT nasal spray Commonly known as: FLONASE SHAKE LIQUID AND USE 2 SPRAYS IN EACH NOSTRIL DAILY What changed: See the new instructions.   insulin NPH Human 100 UNIT/ML injection Commonly known as: NOVOLIN N Inject 36 units into the skin every morning and inject 24 units into the skin at bedtime.   insulin regular 100 units/mL injection Commonly known as: NOVOLIN R Inject 12 Units into the skin 2 (two) times daily before a meal.   INSULIN SYRINGE .5CC/31GX5/16" 31G X 5/16" 0.5 ML Misc USE TO INJECT INSULIN 5 TIMES PER DAY   irbesartan 300 MG tablet Commonly known as: AVAPRO TAKE 1/2 TABLET(150 MG) BY MOUTH DAILY   isosorbide mononitrate 30 MG 24 hr tablet Commonly known as: IMDUR Take 1 tablet (30 mg total) by   mouth daily.   metFORMIN 500 MG tablet Commonly known as: GLUCOPHAGE TAKE 1 TABLET(500 MG) BY MOUTH DAILY WITH SUPPER   mometasone 0.1 % cream Commonly known as: ELOCON Apply 1 application topically daily. Use as directed   multivitamin capsule Take 1 capsule by mouth daily.   nitroGLYCERIN 0.3 MG SL tablet Commonly known as: Nitrostat Place 1 tablet (0.3 mg total) under the tongue every 5 (five) minutes as needed for chest pain (ER if no better after 3 tablets).   omeprazole 20 MG capsule Commonly known as: PRILOSEC TAKE 1 CAPSULE(20 MG) BY MOUTH DAILY   ondansetron 8 MG tablet Commonly known as: ZOFRAN Take 1 tablet (8 mg total) by mouth every 8 (eight) hours as needed for  nausea or vomiting.   ONE TOUCH ULTRA SYSTEM KIT w/Device Kit 1 kit by Does not apply route once.   OneTouch Ultra test strip Generic drug: glucose blood USE AS DIRECTED THREE TIMES DAILY   Venclexta 100 MG tablet Generic drug: venetoclax Take 1 tablet (100 mg total) by mouth daily. Tablets should be swallowed whole with a meal and a full glass of water.   Victoza 18 MG/3ML Sopn Generic drug: liraglutide ADMINISTER 1.2 MG UNDER THE SKIN DAILY        Allergies:  Allergies  Allergen Reactions   Codeine Other (See Comments)    REACTION: makes [name] nervous, orTylenol #3   Ibuprofen Other (See Comments)    REACTION: nervous   Meperidine Hcl Nausea And Vomiting   Naproxen Sodium Other (See Comments)    REACTION: nervous   Tramadol Other (See Comments)    Insomnia     Past Medical History, Surgical history, Social history, and Family History were reviewed and updated.  Review of Systems: Review of Systems  Constitutional: Negative.   HENT: Negative.    Eyes: Negative.   Respiratory: Negative.    Cardiovascular: Negative.   Gastrointestinal: Negative.   Genitourinary: Negative.   Musculoskeletal: Negative.   Skin: Negative.   Neurological: Negative.   Endo/Heme/Allergies: Negative.   Psychiatric/Behavioral: Negative.       Physical Exam:  height is 5' 6" (1.676 m) and weight is 175 lb (79.4 kg). [name] oral temperature is 98 F (36.7 C). [name] blood pressure is 145/68 (abnormal) and [name] pulse is 67. [name] respiration is 18 and oxygen saturation is 97%.   Wt Readings from Last 3 Encounters:  09/16/21 175 lb (79.4 kg)  07/12/21 177 lb 4 oz (80.4 kg)  06/30/21 179 lb (81.2 kg)    Physical Exam Vitals reviewed.  HENT:     Head: Normocephalic and atraumatic.  Eyes:     Pupils: Pupils are equal, round, and reactive to light.  Cardiovascular:     Rate and Rhythm: Normal rate and regular rhythm.     Heart sounds: Normal heart sounds.  Pulmonary:     Effort: Pulmonary  effort is normal.     Breath sounds: Normal breath sounds.  Abdominal:     General: Bowel sounds are normal.     Palpations: Abdomen is soft.  Musculoskeletal:        General: No tenderness or deformity. Normal range of motion.     Cervical back: Normal range of motion.  Lymphadenopathy:     Cervical: No cervical adenopathy.  Skin:    General: Skin is warm and dry.     Findings: No erythema or rash.  Neurological:     Mental Status: [name] is alert and oriented to person,   place, and time.  Psychiatric:        Behavior: Behavior normal.        Thought Content: Thought content normal.        Judgment: Judgment normal.      Lab Results  Component Value Date   WBC 5.0 09/16/2021   HGB 10.4 (L) 09/16/2021   HCT 31.2 (L) 09/16/2021   MCV 99.4 09/16/2021   PLT 104 (L) 09/16/2021   Lab Results  Component Value Date   FERRITIN 90 08/24/2020   IRON 81 08/24/2020   TIBC 310 08/24/2020   UIBC 229 08/24/2020   IRONPCTSAT 26 08/24/2020   Lab Results  Component Value Date   RETICCTPCT 1.9 08/24/2020   RBC 3.14 (L) 09/16/2021   Lab Results  Component Value Date   KPAFRELGTCHN 32.8 (H) 05/16/2021   LAMBDASER 26.4 (H) 05/16/2021   KAPLAMBRATIO 1.24 05/16/2021   Lab Results  Component Value Date   IGGSERUM 713 07/12/2021   IGA 143 07/12/2021   IGMSERUM 17 (L) 07/12/2021   Lab Results  Component Value Date   TOTALPROTELP 6.5 11/18/2020   ALBUMINELP 3.6 11/18/2020   A1GS 0.3 11/18/2020   A2GS 0.9 11/18/2020   BETS 1.0 11/18/2020   BETA2SER 6.2 02/25/2014   GAMS 0.7 11/18/2020   MSPIKE Not Observed 11/18/2020   SPEI * 02/25/2014     Chemistry      Component Value Date/Time   NA 143 09/16/2021 1138   NA 147 (H) 11/03/2016 1018   NA 143 08/30/2016 0954   K 4.5 09/16/2021 1138   K 3.9 11/03/2016 1018   K 4.5 08/30/2016 0954   CL 110 09/16/2021 1138   CL 110 (H) 11/03/2016 1018   CO2 27 09/16/2021 1138   CO2 27 11/03/2016 1018   CO2 24 08/30/2016 0954   BUN 21  09/16/2021 1138   BUN 25 (H) 11/03/2016 1018   BUN 20.5 08/30/2016 0954   CREATININE 1.50 (H) 09/16/2021 1138   CREATININE 1.6 (H) 11/03/2016 1018   CREATININE 1.4 (H) 08/30/2016 0954      Component Value Date/Time   CALCIUM 9.3 09/16/2021 1138   CALCIUM 10.1 11/03/2016 1018   CALCIUM 9.8 08/30/2016 0954   ALKPHOS 59 09/16/2021 1138   ALKPHOS 112 (H) 11/03/2016 1018   ALKPHOS 113 08/30/2016 0954   AST 21 09/16/2021 1138   AST 23 08/30/2016 0954   ALT 15 09/16/2021 1138   ALT 29 11/03/2016 1018   ALT 20 08/30/2016 0954   BILITOT 0.6 09/16/2021 1138   BILITOT 0.38 08/30/2016 0954      Impression and Plan: [name] is a very pleasant 80 yo caucasian female with CLL.  So far, we have not had any negative prognostic markers.  [name] does have the 13q-chromosomal abnormality.  This is typically construed as a good prognostic marker.  [name] currently is on acalabrutinib and venetoclax.  [name] will continue on this, for as long as [name] tolerates this.  Hopefully, we will be able to get [name] off the therapy sometime in early 2025.  I am just happy that [name] quality life is doing well.  We will certainly stay strong and prayer for [name] son.  I would like to get [name] back to see [name] sometime in November or December.      R , MD 9/15/202312:53 PM 

## 2021-09-18 LAB — IGG, IGA, IGM
IgA: 137 mg/dL (ref 64–422)
IgG (Immunoglobin G), Serum: 681 mg/dL (ref 586–1602)
IgM (Immunoglobulin M), Srm: 15 mg/dL — ABNORMAL LOW (ref 26–217)

## 2021-09-19 LAB — SURGICAL PATHOLOGY

## 2021-09-21 ENCOUNTER — Telehealth: Payer: Self-pay

## 2021-09-21 NOTE — Telephone Encounter (Signed)
Called and informed patient of lab results, patient verbalized understanding and denies any questions or concerns at this time.   

## 2021-09-21 NOTE — Telephone Encounter (Signed)
NANM - will try again later.

## 2021-09-21 NOTE — Telephone Encounter (Signed)
-----   Message from [name], MD sent at 09/19/2021  4:42 PM EDT ----- Call let her know that the flow cytometry looks fantastic.  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  This is fantastic.  [name]

## 2021-09-22 ENCOUNTER — Other Ambulatory Visit (HOSPITAL_COMMUNITY): Payer: Self-pay

## 2021-09-23 LAB — FLOW CYTOMETRY

## 2021-09-28 ENCOUNTER — Other Ambulatory Visit (HOSPITAL_COMMUNITY): Payer: Self-pay

## 2021-10-01 ENCOUNTER — Other Ambulatory Visit: Payer: Self-pay | Admitting: Endocrinology

## 2021-10-03 ENCOUNTER — Telehealth: Payer: Self-pay

## 2021-10-03 NOTE — Patient Outreach (Signed)
  Care Coordination   Initial Visit Note   10/03/2021 Name: Maquita Sandoval [place] MRN: [id number] DOB: 06/21/1940  Windell Moulding Kulig is a 81 y.o. year old female who sees Colon Branch, MD for primary care. I spoke with  [place] by phone today.  What matters to the patients health and wellness today?  Patient denies any care coordination, education or resource needs at this time.    Goals Addressed             This Visit's Progress    COMPLETED: Care Coordination Activities-no follow up required       Care Coordination Interventions: Discussed Care Coordination Program Encouraged patient to contact Care Coordinator (contact number provided) or primary care provider if care coordination needs change in the future.         SDOH assessments and interventions completed:  Yes  SDOH Interventions Today    Flowsheet Row Most Recent Value  SDOH Interventions   Housing Interventions Intervention Not Indicated  Transportation Interventions Intervention Not Indicated  Utilities Interventions Intervention Not Indicated        Care Coordination Interventions Activated:  Yes  Care Coordination Interventions:  Yes, provided   Follow up plan: No further intervention required.   Encounter Outcome:  Pt. Visit Completed   Thea Silversmith, RN, MSN, BSN, [place] Coordinator [phone]

## 2021-10-13 ENCOUNTER — Other Ambulatory Visit (HOSPITAL_COMMUNITY): Payer: Self-pay

## 2021-10-20 ENCOUNTER — Other Ambulatory Visit (HOSPITAL_COMMUNITY): Payer: Self-pay

## 2021-10-22 ENCOUNTER — Other Ambulatory Visit: Payer: Self-pay | Admitting: Endocrinology

## 2021-10-24 ENCOUNTER — Other Ambulatory Visit: Payer: Self-pay | Admitting: Internal Medicine

## 2021-10-24 DIAGNOSIS — Z1231 Encounter for screening mammogram for malignant neoplasm of breast: Secondary | ICD-10-CM

## 2021-10-25 ENCOUNTER — Other Ambulatory Visit (HOSPITAL_COMMUNITY): Payer: Self-pay

## 2021-10-25 ENCOUNTER — Other Ambulatory Visit (INDEPENDENT_AMBULATORY_CARE_PROVIDER_SITE_OTHER): Payer: Medicare Other

## 2021-10-25 DIAGNOSIS — E1165 Type 2 diabetes mellitus with hyperglycemia: Secondary | ICD-10-CM

## 2021-10-25 DIAGNOSIS — Z794 Long term (current) use of insulin: Secondary | ICD-10-CM | POA: Diagnosis not present

## 2021-10-25 LAB — BASIC METABOLIC PANEL
BUN: 27 mg/dL — ABNORMAL HIGH (ref 6–23)
CO2: 24 mEq/L (ref 19–32)
Calcium: 9 mg/dL (ref 8.4–10.5)
Chloride: 106 mEq/L (ref 96–112)
Creatinine, Ser: 1.43 mg/dL — ABNORMAL HIGH (ref 0.40–1.20)
GFR: 34.24 mL/min — ABNORMAL LOW (ref >60.00)
Glucose, Bld: 146 mg/dL — ABNORMAL HIGH (ref 70–99)
Potassium: 4.6 mEq/L (ref 3.5–5.1)
Sodium: 139 mEq/L (ref 135–145)

## 2021-10-25 LAB — HEMOGLOBIN A1C: Hgb A1c MFr Bld: 5.2 % (ref 4.6–6.5)

## 2021-10-26 ENCOUNTER — Encounter: Payer: Self-pay | Admitting: Internal Medicine

## 2021-10-26 ENCOUNTER — Ambulatory Visit (INDEPENDENT_AMBULATORY_CARE_PROVIDER_SITE_OTHER): Payer: Medicare Other | Admitting: Internal Medicine

## 2021-10-26 VITALS — BP 140/82 | HR 73 | Temp 98.0°F | Resp 18 | Ht 66.0 in | Wt 177.4 lb

## 2021-10-26 DIAGNOSIS — I25118 Atherosclerotic heart disease of native coronary artery with other forms of angina pectoris: Secondary | ICD-10-CM

## 2021-10-26 DIAGNOSIS — H60332 Swimmer's ear, left ear: Secondary | ICD-10-CM | POA: Diagnosis not present

## 2021-10-26 DIAGNOSIS — H60502 Unspecified acute noninfective otitis externa, left ear: Secondary | ICD-10-CM

## 2021-10-26 LAB — FRUCTOSAMINE: Fructosamine: 207 umol/L (ref 0–285)

## 2021-10-26 MED ORDER — AMOXICILLIN-POT CLAVULANATE 875-125 MG PO TABS: 1.0000 | ORAL_TABLET | Freq: Two times a day (BID) | ORAL | 0 refills | Status: DC

## 2021-10-26 MED ORDER — CIPROFLOXACIN-DEXAMETHASONE 0.3-0.1 % OT SUSP: 4.0000 [drp] | Freq: Two times a day (BID) | OTIC | 0 refills | Status: DC

## 2021-10-26 NOTE — Patient Instructions (Addendum)
Eardrops twice daily  Oral antibiotics twice daily for 1 week  Tylenol  500 mg OTC 2 tabs a day every 8 hours as needed for pain  We are referring you to ENT  ER if: Pain increases, fever, swelling and redness around the ear

## 2021-10-26 NOTE — Progress Notes (Signed)
Patient ID: [name], female   DOB: September 12, 1940, 81 y.o.   MRN: [id number]            Reason for Appointment: Followup    History of Present Illness:          Diagnosis: Type 2 diabetes mellitus, date of diagnosis: 1998       Past history:   She was treated with metformin at diagnosis when this had been continued until 2015 Subsequently Amaryl was also added several years ago and this has been continued Her blood sugars were under fair control between 2010 and early 2013 with A1c ranging from 7.4-9.4, mostly under 8% However since 08/2011 her A1c has been mostly over 8%  Insulin was added in 2014 with small doses of Lantus and this has been progressively increased She was started on mealtime insulin on her initial consultation in 9/15 because of high postprandial readings, sometimes over 300 With adding Victoza in 02/2014 her blood sugars were somewhat better with A1c coming down below 8%  Recent history:   INSULIN regimen: Regular 12 units a.m. and 12 units before dinner.  NPH 36 units in the morning and 24 hs  Non-insulin hypoglycemic drugs the patient is taking are: Victoza 0.6 mg daily.    Current blood sugar patterns, daily management and problems identified:  Her A1c is again lower than expected at 5.2  She appears to have overall lower readings compared to our last visit This is without any change in her diet As before she is usually not checking sugars after lunch or in the afternoon Not clear if some of her morning readings are after breakfast but she thinks that she is eating breakfast at variable times Overall blood sugars are relatively lower after dinner and has a couple of low sugars also Likely is having variable intake of carbohydrate As before is not able to do any exercise She was going to get the freestyle libre sensor but not clear if she was called by the proper DME company and she did not pursue this      Dinner is usually at 6-7 pm Bfst 10 am  (yogurt)  Glucose monitoring:  done 1-2 times a day         Glucometer: One Touch ultra 2.       Blood Glucose readings by time of day from download for the last 2 weeks:   PRE-MEAL Fasting Lunch Dinner Bedtime Overall  Glucose range: 80-186      Mean/median: 131    125   POST-MEAL PC Breakfast PC Lunch PC Dinner  Glucose range:   63-211  Mean/median:   120   Prior  PRE-MEAL Fasting Lunch Dinner Bedtime Overall  Glucose range: 49-178   81-194 49-194  Mean/median: 135   145 140   POST-MEAL PC Breakfast PC Lunch PC Dinner  Glucose range:   ?  Mean/median:      prior  PRE-MEAL Fasting Lunch Dinner Bedtime Overall  Glucose range: 115-260      Mean/median: 148    161   POST-MEAL PC Breakfast PC Lunch PC Dinner  Glucose range:   104-514  Mean/median:       Self-care: The diet that the patient has been following is: none, usually eating low fat meals Meals: 2-3 meals per day          Dietician visit, most recent: never.  She saw the CDE in 02/2014  Weight history:  Wt Readings from Last 3 Encounters:  10/27/21 177 lb (80.3 kg)  10/26/21 177 lb 6 oz (80.5 kg)  09/16/21 175 lb (79.4 kg)    Glycemic control:   Lab Results  Component Value Date   HGBA1C 5.2 10/25/2021   HGBA1C 5.9 06/28/2021   HGBA1C 6.3 03/15/2021   Lab Results  Component Value Date   MICROALBUR 9.5 (H) 12/09/2020   LDLCALC 41 05/05/2020   CREATININE 1.43 (H) 10/25/2021   Lab Results  Component Value Date   HGB 10.4 (L) 09/16/2021   Lab Results  Component Value Date   FRUCTOSAMINE 207 10/25/2021   FRUCTOSAMINE 230 06/28/2021   FRUCTOSAMINE 228 07/30/2017     OTHER active problems  discussed in review of systems   Allergies as of 10/27/2021       Reactions   Codeine Other (See Comments)   REACTION: makes her nervous, orTylenol #3   Ibuprofen Other (See Comments)   REACTION: nervous   Meperidine Hcl Nausea And Vomiting   Naproxen Sodium Other (See Comments)    REACTION: nervous   Tramadol Other (See Comments)   Insomnia         Medication List        Accurate as of October 27, 2021  1:22 PM. If you have any questions, ask your nurse or doctor.          amLODipine 5 MG tablet Commonly known as: NORVASC TAKE 1 TABLET(5 MG) BY MOUTH DAILY   amoxicillin-clavulanate 875-125 MG tablet Commonly known as: AUGMENTIN Take 1 tablet by mouth 2 (two) times daily.   aspirin 81 MG chewable tablet Chew 1 tablet (81 mg total) by mouth daily.   atenolol 100 MG tablet Commonly known as: TENORMIN TAKE 1 TABLET(100 MG) BY MOUTH DAILY   atorvastatin 20 MG tablet Commonly known as: LIPITOR TAKE 1 TABLET(20 MG) BY MOUTH DAILY   azelastine 0.1 % nasal spray Commonly known as: ASTELIN Place 2 sprays into both nostrils at bedtime as needed for rhinitis or allergies. Use in each nostril as directed   Calquence 100 MG tablet Generic drug: acalabrutinib maleate Take 1 tablet (100 mg) by mouth daily.   cholecalciferol 1000 units tablet Commonly known as: VITAMIN D Take 1,000 Units by mouth daily.   ciprofloxacin-dexamethasone OTIC suspension Commonly known as: Ciprodex Place 4 drops into the left ear 2 (two) times daily.   clopidogrel 75 MG tablet Commonly known as: PLAVIX TAKE 1 TABLET(75 MG) BY MOUTH DAILY   fluticasone 50 MCG/ACT nasal spray Commonly known as: FLONASE SHAKE LIQUID AND USE 2 SPRAYS IN EACH NOSTRIL DAILY What changed: See the new instructions.   insulin NPH Human 100 UNIT/ML injection Commonly known as: NOVOLIN N Inject 36 units into the skin every morning and inject 24 units into the skin at bedtime.   insulin regular 100 units/mL injection Commonly known as: NOVOLIN R Inject 12 Units into the skin 2 (two) times daily before a meal.   INSULIN SYRINGE .5CC/31GX5/16" 31G X 5/16" 0.5 ML Misc USE TO INJECT INSULIN 5 TIMES PER DAY   irbesartan 300 MG tablet Commonly known as: AVAPRO TAKE 1/2 TABLET(150 MG) BY MOUTH  DAILY   isosorbide mononitrate 30 MG 24 hr tablet Commonly known as: IMDUR Take 1 tablet (30 mg total) by mouth daily.   metFORMIN 500 MG tablet Commonly known as: GLUCOPHAGE TAKE 1 TABLET(500 MG) BY MOUTH DAILY WITH SUPPER   mometasone 0.1 % cream Commonly known as: ELOCON Apply  1 application topically daily. Use as directed   multivitamin capsule Take 1 capsule by mouth daily.   nitroGLYCERIN 0.3 MG SL tablet Commonly known as: Nitrostat Place 1 tablet (0.3 mg total) under the tongue every 5 (five) minutes as needed for chest pain (ER if no better after 3 tablets).   omeprazole 20 MG capsule Commonly known as: PRILOSEC TAKE 1 CAPSULE(20 MG) BY MOUTH DAILY   ondansetron 8 MG tablet Commonly known as: ZOFRAN Take 1 tablet (8 mg total) by mouth every 8 (eight) hours as needed for nausea or vomiting.   ONE TOUCH ULTRA SYSTEM KIT w/Device Kit 1 kit by Does not apply route once.   OneTouch Ultra test strip Generic drug: glucose blood USE AS DIRECTED THREE TIMES DAILY   Venclexta 100 MG tablet Generic drug: venetoclax Take 1 tablet (100 mg total) by mouth daily. Tablets should be swallowed whole with a meal and a full glass of water.   Victoza 18 MG/3ML Sopn Generic drug: liraglutide ADMINISTER 1.2 MG UNDER THE SKIN DAILY        Allergies:  Allergies  Allergen Reactions   Codeine Other (See Comments)    REACTION: makes her nervous, orTylenol #3   Ibuprofen Other (See Comments)    REACTION: nervous   Meperidine Hcl Nausea And Vomiting   Naproxen Sodium Other (See Comments)    REACTION: nervous   Tramadol Other (See Comments)    Insomnia     Past Medical History:  Diagnosis Date   Acute blood loss anemia 09/17/2015   Allergy    Anemia    Anginal pain ([place]) 2017   Anxiety    Arthritis    "hands and knees mostly" (09/16/2015)   B12 deficiency anemia    CAD (coronary artery disease)    Dr Gwenlyn Found   Cataract    bil cataracts removed   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   Clotting disorder ([place])    Depression    DJD (degenerative joint disease)    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   Gastritis    GERD (gastroesophageal reflux disease)    Headache(784.0)    History of blood transfusion    "I"ve had 20 some; thru birth of children, loss of blood, last 2 were in ~ 1999 before my cancer surgery" (09/16/2015)   History of hiatal hernia    History of shingles    Hx of colonic polyps    Hyperlipidemia    Hypertension    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   Myocardial infarction ([place])    1997   Pneumonia    X 1   Postoperative hematoma involving circulatory system following cardiac catheterization 09/17/2015   S/P cardiac cath 09/16/15 09/17/2015   Type II diabetes mellitus ([place])    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[facility])    as a child   Venous insufficiency     Past Surgical History:  Procedure Laterality Date   [place]; 2008; 09/16/2015   CARDIAC CATHETERIZATION N/A 09/16/2015   Procedure: Right/Left Heart Cath and Coronary/Graft Angiography;  Surgeon: Lorretta Harp, MD;  Location: [facility];  Service: Cardiovascular;  Laterality: N/A;   CESAREAN SECTION  1966   COLONOSCOPY     CORONARY ARTERY BYPASS GRAFT  1997   CABG X5   EYE SURGERY     2 laser surgeries on left eye with implant   EYE SURGERY Bilateral  cataracts   FEMUR IM NAIL Left 08/28/2018   Procedure: INTRAMEDULLARY (IM) NAIL FEMORAL;  Surgeon: Rod Can, MD;  Location: [facility];  Service: Orthopedics;  Laterality: Left;   FRACTURE SURGERY     HERNIA REPAIR     LAPAROSCOPIC ASSISTED VENTRAL HERNIA REPAIR  09/2008    with incarcerated colon;  Dr. Lucia Gaskins   MOHS SURGERY  06/2016   [place] Left 11/1994   "crushed my knee"; put in a plate & 6 screws"   POLYPECTOMY     REFRACTIVE SURGERY Left 07/30/2015   resection of small  bowel carcinoma  06/1998   Dr. March Rummage   SHOULDER ARTHROSCOPY W/ ROTATOR CUFF REPAIR Right [place]  1960s   TOTAL KNEE ARTHROPLASTY WITH HARDWARE REMOVAL Left 12/1994    (infex, hardware removed )   TUBAL LIGATION  1966    Family History  Problem Relation Age of Onset   Heart disease Mother 66   Heart disease Father 59       MI   Hypertension Child    Heart attack Child    Diabetes Child    Heart disease Maternal Aunt        x 2, all deceased   Heart disease Maternal Uncle        x 4, all deceased   Emphysema Brother 61   Colon cancer Neg Hx    Breast cancer Neg Hx    Rectal cancer Neg Hx    Stomach cancer Neg Hx     Social History:  reports that she has never smoked. She has never used smokeless tobacco. She reports that she does not drink alcohol and does not use drugs.    Review of Systems         Lipids: Has had excellent control of hypercholesterolemia with long-term use of Lipitor She has a history of coronary bypass surgery.  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3 kg)   SpO2 98%   BMI 34.57 kg/m     Diabetes type 2, insulin requiring    See history of present illness for detailed discussion of her current blood sugar patterns, management and problems identified  Her A1c is improved at 5.2 Also fructosamine is lower at 207  A1c is likely falsely low because of anemia and renal function abnormality  She has been on regular and NPH insulin twice a day along with metformin and Victoza  She has lower readings after dinner and does not appear to be needing as much mealtime insulin Also because of eating a small meal in the morning likely does not need as much regular insulin Still has not been able to get the CGM because of logistical factors  RENAL insufficiency: Her creatinine is variable    HYPERTENSION: Blood pressure is minimally increased   LIPIDS: Followed by cardiology  PLAN:  Reminded her about needing to avoid low blood sugars and blood sugar targets were reviewed She will reduce her regular insulin to 10 units both breakfast and suppertime To check some readings in the afternoon also Another prescription will be sent for freestyle libre through [employer] and the local supplier Make sure she has balanced meals with some protein To have an afternoon snack if not eating lunch  Encouraged her to start checking blood pressure regularly at home  Total visit time including counseling = 30 minutes  Patient Instructions  Take 10 Novolin R before breakfast and supper  Check blood sugars on waking up 3-4 days a week  Also check blood sugars about 2 hours after meals and do this after different meals by rotation  Recommended  blood sugar levels on waking up are 90-130 and about 2 hours after meal is 130-160  Please bring your blood sugar monitor to each visit, thank you    Elayne Snare 10/27/2021, 1:22 PM   Note: This office note was prepared with Dragon voice recognition system technology. Any transcriptional errors that result from this process are unintentional.

## 2021-10-26 NOTE — Progress Notes (Unsigned)
Subjective:    Patient ID: [place], female    DOB: 11-Mar-1940, 81 y.o.   MRN: [id number]  DOS:  10/26/2021 Type of visit - description: acute  Symptoms started 6 days ago with pain at the left ear. It is gradually getting worse. She denies ear discharge or bleeding. The pain is a steady but is worse when she opens her mouth.  She thinks that she had a low-grade temperature 4 days ago but other than that afebrile. Denies any sore throat or cough.   Review of Systems See above   Past Medical History:  Diagnosis Date   Acute blood loss anemia 09/17/2015   Allergy    Anemia    Anginal pain ([place]) 2017   Anxiety    Arthritis    "hands and knees mostly" (09/16/2015)   B12 deficiency anemia    CAD (coronary artery disease)    Dr Gwenlyn Found   Cataract    bil cataracts removed   CLL (chronic lymphocytic leukemia) ([place]) 02/25/2014   Clotting disorder ([place])    Depression    DJD (degenerative joint disease)    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   Gastritis    GERD (gastroesophageal reflux disease)    Headache(784.0)    History of blood transfusion    "I"ve had 20 some; thru birth of children, loss of blood, last 2 were in ~ 1999 before my cancer surgery" (09/16/2015)   History of hiatal hernia    History of shingles    Hx of colonic polyps    Hyperlipidemia    Hypertension    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)    as a child   Venous insufficiency     Past Surgical History:  Procedure Laterality Date   [place]; 2008; 09/16/2015   CARDIAC CATHETERIZATION N/A 09/16/2015   Procedure: Right/Left Heart Cath and  Coronary/Graft Angiography;  Surgeon: Lorretta Harp, MD;  Location: [facility];  Service: Cardiovascular;  Laterality: N/A;   CESAREAN SECTION  1966   COLONOSCOPY     CORONARY ARTERY BYPASS GRAFT  1997   CABG X5   EYE SURGERY     2 laser surgeries on left eye with implant   EYE SURGERY Bilateral    cataracts   FEMUR IM NAIL Left 08/28/2018   Procedure: INTRAMEDULLARY (IM) NAIL FEMORAL;  Surgeon: Rod Can, MD;  Location: [facility];  Service: Orthopedics;  Laterality: Left;   FRACTURE SURGERY     HERNIA REPAIR     LAPAROSCOPIC ASSISTED VENTRAL HERNIA REPAIR  09/2008    with incarcerated colon;  Dr. Lucia Gaskins   MOHS SURGERY  06/2016   [place] Left 11/1994   "crushed my knee"; put in a plate & 6 screws"   POLYPECTOMY     REFRACTIVE SURGERY Left 07/30/2015   resection of small bowel carcinoma  06/1998   Dr. March Rummage   SHOULDER ARTHROSCOPY W/ ROTATOR CUFF REPAIR Right 1997   SMALL INTESTINE SURGERY     TONSILLECTOMY  1960s   TOTAL KNEE ARTHROPLASTY WITH HARDWARE REMOVAL Left 12/1994    (infex, hardware removed )  TUBAL LIGATION  1966    Current Outpatient Medications  Medication Instructions   acalabrutinib maleate (CALQUENCE) 100 MG tablet Take 1 tablet (100 mg) by mouth daily.   amLODipine (NORVASC) 5 MG tablet TAKE 1 TABLET(5 MG) BY MOUTH DAILY   aspirin 81 mg, Oral, Daily   atenolol (TENORMIN) 100 MG tablet TAKE 1 TABLET(100 MG) BY MOUTH DAILY   atorvastatin (LIPITOR) 20 MG tablet TAKE 1 TABLET(20 MG) BY MOUTH DAILY   azelastine (ASTELIN) 0.1 % nasal spray 2 sprays, Each Nare, At bedtime PRN, Use in each nostril as directed   Blood Glucose Monitoring Suppl (ONE TOUCH ULTRA SYSTEM KIT) W/DEVICE KIT 1 kit, Does not apply,  Once   cholecalciferol (VITAMIN D) 1,000 Units, Oral, Daily,     clopidogrel (PLAVIX) 75 MG tablet TAKE 1 TABLET(75 MG) BY MOUTH DAILY   fluticasone (FLONASE) 50 MCG/ACT nasal spray SHAKE LIQUID AND USE 2 SPRAYS IN EACH NOSTRIL  DAILY   glucose blood (ONETOUCH ULTRA) test strip USE AS DIRECTED THREE TIMES DAILY   insulin NPH Human (HUMULIN N,NOVOLIN N) 100 UNIT/ML injection Inject 36 units into the skin every morning and inject 24 units into the skin at bedtime.   insulin regular (NOVOLIN R) 12 Units, Subcutaneous, 2 times daily before meals   Insulin Syringe-Needle U-100 (INSULIN SYRINGE .5CC/31GX5/16") 31G X 5/16" 0.5 ML MISC USE TO INJECT INSULIN 5 TIMES PER DAY   irbesartan (AVAPRO) 300 MG tablet TAKE 1/2 TABLET(150 MG) BY MOUTH DAILY   isosorbide mononitrate (IMDUR) 30 mg, Oral, Daily   metFORMIN (GLUCOPHAGE) 500 MG tablet TAKE 1 TABLET(500 MG) BY MOUTH DAILY WITH SUPPER   mometasone (ELOCON) 0.1 % cream 1 application , Topical, Daily, Use as directed   Multiple Vitamin (MULTIVITAMIN) capsule 1 capsule, Oral, Daily,     nitroGLYCERIN (NITROSTAT) 0.3 mg, Sublingual, Every 5 min PRN   omeprazole (PRILOSEC) 20 MG capsule TAKE 1 CAPSULE(20 MG) BY MOUTH DAILY   ondansetron (ZOFRAN) 8 mg, Oral, Every 8 hours PRN   Venclexta 100 mg, Oral, Daily, Tablets should be swallowed whole with a meal and a full glass of water.   VICTOZA 18 MG/3ML SOPN ADMINISTER 1.2 MG UNDER THE SKIN DAILY       Objective:   Physical Exam BP (!) 140/82   Pulse 73   Temp 98 F (36.7 C) (Oral)   Resp 18   Ht 5' 6" (1.676 m)   Wt 177 lb 6 oz (80.5 kg)   SpO2 99%   BMI 28.63 kg/m  General:   Well developed, NAD, BMI noted. HEENT:  Normocephalic . Face symmetric, atraumatic EOMI. R ear: Normal L left ear: [place] is slightly swollen and TTP but not red. Canal is quite swollen, unable to see the TM, I can see a small amount of discharge.  No blisters. Throat: Symmetric.  No redness. Lower extremities: no pretibial edema bilaterally  Skin: Not pale. Not jaundice Neurologic:  alert & oriented X3.  Speech normal, gait appropriate for age and unassisted Psych--  Cognition and judgment appear intact.  Cooperative with normal attention  span and concentration.  Behavior appropriate. No anxious or depressed appearing.      Assessment     Assessment DM  Dr Dwyane Dee  +retinopathy  HTN Hyperlipidemia CKD: creat ~1.4  (GFR ~ 38) [place] 09-2019: No obstruction, bilateral cortical thinning and a small renal cysts Depression/anxiety: tranxene qhs prn (takes rarely) MSK: -DJD  - Osteoporosis ---T score -0.8 on December 2017, h/o a foot FX  d/t walking years ago --- (L) Hip Fx, 08/2018: started fosamax, switch to Prolia d/t CKD and diff swallowing, first dose 12-2019 Hem/Onc: Dr Marin Olp  -CLL -iron deficiency anemia: IV iron 2018 -B12 def CAD, Dr Gwenlyn Found [place] 97 >> CABG, cath 12-13-2006, myoview 2012 no ischemic CP: Stress test 08/05/2015, indeterminate risk study, + lateral ischemia: Cardiac catheterization 09/16/2015: Rx medical Venous insuff GI:  Dr Silverio Decamp ---GERD, IBS, h/o Gastritis ---H/o ulcerative colitis as a child H/o shingles  BCC Dr Allyson Sabal, bx 04-2015, MOH's 06-2016   PLAN Otitis media, patient with diabetes: Recommend to start eardrops ASAP, Ciprodex sent.  I am also going to start oral antibiotics (Augmentin). Explained to the patient that this could be potentially very serious, strongly recommend to go to the ER if the pain increases or she sees more swelling at the ears or some redness. Referring to ENT, will asked to be seen before the weekend.   6-21  DM: Per Endo.  Saw Dr. Dwyane Dee 03-2021 HTN:BP looks very good, on amlodipine, Tenormin, irbesartan, Imdur, last BMP satisfactory with a creatinine of 1.27. Hyperlipidemia: On atorvastatin, check FLP. Osteoporosis: Prolia today, on OTC vitamin D.  Last Vit d  level satisfactory. 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 CAD  Saw cardiology April 2023, no changes in therapy recommended.  She reports take aspirin 81 mg only 1 tablet a day.  She also takes Plavix. Red blood per rectum: See ROS, occasionally see small amounts of red blood when  she wipes after a BM.  Last CBC showed no anemia.  Rec observation. Social: Lives by herself, independent on all her ADLs, still drives.   Preventive care: encouraged shingrix, rx printed  RTC 6 months

## 2021-10-27 ENCOUNTER — Encounter: Payer: Self-pay | Admitting: Endocrinology

## 2021-10-27 ENCOUNTER — Ambulatory Visit (INDEPENDENT_AMBULATORY_CARE_PROVIDER_SITE_OTHER): Payer: Medicare Other | Admitting: Endocrinology

## 2021-10-27 VITALS — BP 140/70 | HR 70 | Ht 60.0 in | Wt 177.0 lb

## 2021-10-27 DIAGNOSIS — I25118 Atherosclerotic heart disease of native coronary artery with other forms of angina pectoris: Secondary | ICD-10-CM

## 2021-10-27 DIAGNOSIS — N289 Disorder of kidney and ureter, unspecified: Secondary | ICD-10-CM | POA: Diagnosis not present

## 2021-10-27 DIAGNOSIS — I10 Essential (primary) hypertension: Secondary | ICD-10-CM

## 2021-10-27 DIAGNOSIS — E1165 Type 2 diabetes mellitus with hyperglycemia: Secondary | ICD-10-CM | POA: Diagnosis not present

## 2021-10-27 DIAGNOSIS — Z794 Long term (current) use of insulin: Secondary | ICD-10-CM | POA: Diagnosis not present

## 2021-10-27 NOTE — Assessment & Plan Note (Signed)
Otitis media, patient with diabetes: Recommend to start eardrops ASAP, Ciprodex sent.  I am also going to start oral antibiotics (Augmentin). Explained to the patient that this could be potentially very serious, strongly recommend to go to the ER if the pain increases or she sees more swelling at the ears or some redness. Referring to ENT, will asked to be seen before the weekend.

## 2021-10-27 NOTE — Patient Instructions (Addendum)
Take 10 Novolin R before breakfast and supper  Check blood sugars on waking up 3-4 days a week  Also check blood sugars about 2 hours after meals and do this after different meals by rotation  Recommended blood sugar levels on waking up are 90-130 and about 2 hours after meal is 130-160  Please bring your blood sugar monitor to each visit, thank you

## 2021-11-01 ENCOUNTER — Other Ambulatory Visit: Payer: Self-pay | Admitting: Cardiovascular Disease

## 2021-11-09 DIAGNOSIS — H60332 Swimmer's ear, left ear: Secondary | ICD-10-CM | POA: Diagnosis not present

## 2021-11-09 DIAGNOSIS — H903 Sensorineural hearing loss, bilateral: Secondary | ICD-10-CM | POA: Diagnosis not present

## 2021-11-09 DIAGNOSIS — H838X3 Other specified diseases of inner ear, bilateral: Secondary | ICD-10-CM | POA: Diagnosis not present

## 2021-11-14 ENCOUNTER — Other Ambulatory Visit: Payer: Self-pay | Admitting: Hematology & Oncology

## 2021-11-14 ENCOUNTER — Other Ambulatory Visit (HOSPITAL_COMMUNITY): Payer: Self-pay

## 2021-11-14 DIAGNOSIS — C9110 Chronic lymphocytic leukemia of B-cell type not having achieved remission: Secondary | ICD-10-CM

## 2021-11-14 MED ORDER — VENETOCLAX 100 MG PO TABS
100.0000 mg | ORAL_TABLET | Freq: Every day | ORAL | 3 refills | Status: DC
  Filled 2021-11-14: qty 28, 28d supply, fill #0
  Filled 2021-12-13 – 2021-12-16 (×2): qty 28, 28d supply, fill #1
  Filled 2022-01-10: qty 28, 28d supply, fill #2
  Filled 2022-02-15: qty 28, 28d supply, fill #3

## 2021-11-21 ENCOUNTER — Other Ambulatory Visit (HOSPITAL_COMMUNITY): Payer: Self-pay

## 2021-11-21 NOTE — Telephone Encounter (Signed)
Forwarding to Rx Prior Auth Team 

## 2021-11-21 NOTE — Telephone Encounter (Signed)
Prolia VOB initiated via [email or web address] 

## 2021-11-23 NOTE — Telephone Encounter (Signed)
Pt ready for scheduling on or after 12/24/21   Out-of-pocket cost due at time of visit: $0   Primary: Medicare Prolia co-insurance: 20% (approximately $276) Admin fee co-insurance: 20% (approximately $25)   Secondary: AARP Medicare Plan F Prolia co-insurance: Covers Medicare Part B co-insurance Admin fee co-insurance: Covers Medicare Part B co-insurance   Deductible:  $226 of $226 met (Covered by secondary)   Prior Auth: not required PA# Valid:    ** This summary of benefits is an estimation of the patient's out-of-pocket cost. Exact cost may vary based on individual plan coverage.

## 2021-12-04 ENCOUNTER — Other Ambulatory Visit: Payer: Self-pay | Admitting: Endocrinology

## 2021-12-06 DIAGNOSIS — E113311 Type 2 diabetes mellitus with moderate nonproliferative diabetic retinopathy with macular edema, right eye: Secondary | ICD-10-CM | POA: Diagnosis not present

## 2021-12-06 DIAGNOSIS — H3509 Other intraretinal microvascular abnormalities: Secondary | ICD-10-CM | POA: Diagnosis not present

## 2021-12-06 DIAGNOSIS — E113312 Type 2 diabetes mellitus with moderate nonproliferative diabetic retinopathy with macular edema, left eye: Secondary | ICD-10-CM | POA: Diagnosis not present

## 2021-12-06 DIAGNOSIS — H35432 Paving stone degeneration of retina, left eye: Secondary | ICD-10-CM | POA: Diagnosis not present

## 2021-12-06 DIAGNOSIS — H43819 Vitreous degeneration, unspecified eye: Secondary | ICD-10-CM | POA: Diagnosis not present

## 2021-12-06 DIAGNOSIS — H43813 Vitreous degeneration, bilateral: Secondary | ICD-10-CM | POA: Diagnosis not present

## 2021-12-08 ENCOUNTER — Other Ambulatory Visit: Payer: Self-pay | Admitting: Hematology & Oncology

## 2021-12-13 ENCOUNTER — Other Ambulatory Visit (HOSPITAL_COMMUNITY): Payer: Self-pay

## 2021-12-13 ENCOUNTER — Encounter: Payer: Self-pay | Admitting: Hematology & Oncology

## 2021-12-13 ENCOUNTER — Telehealth: Payer: Self-pay

## 2021-12-13 ENCOUNTER — Encounter: Payer: Self-pay | Admitting: Family

## 2021-12-13 ENCOUNTER — Inpatient Hospital Stay: Payer: Medicare Other | Attending: Hematology & Oncology

## 2021-12-13 ENCOUNTER — Inpatient Hospital Stay (HOSPITAL_BASED_OUTPATIENT_CLINIC_OR_DEPARTMENT_OTHER): Payer: Medicare Other | Admitting: Hematology & Oncology

## 2021-12-13 VITALS — BP 173/67 | HR 73 | Temp 98.0°F | Resp 20 | Ht 66.0 in | Wt 171.1 lb

## 2021-12-13 DIAGNOSIS — Z85038 Personal history of other malignant neoplasm of large intestine: Secondary | ICD-10-CM | POA: Insufficient documentation

## 2021-12-13 DIAGNOSIS — C9110 Chronic lymphocytic leukemia of B-cell type not having achieved remission: Secondary | ICD-10-CM

## 2021-12-13 DIAGNOSIS — E1169 Type 2 diabetes mellitus with other specified complication: Secondary | ICD-10-CM

## 2021-12-13 DIAGNOSIS — D509 Iron deficiency anemia, unspecified: Secondary | ICD-10-CM | POA: Insufficient documentation

## 2021-12-13 DIAGNOSIS — Q999 Chromosomal abnormality, unspecified: Secondary | ICD-10-CM | POA: Diagnosis not present

## 2021-12-13 DIAGNOSIS — K909 Intestinal malabsorption, unspecified: Secondary | ICD-10-CM | POA: Diagnosis not present

## 2021-12-13 DIAGNOSIS — E785 Hyperlipidemia, unspecified: Secondary | ICD-10-CM | POA: Diagnosis not present

## 2021-12-13 LAB — CMP (CANCER CENTER ONLY)
ALT: 13 U/L (ref 0–44)
AST: 18 U/L (ref 15–41)
Albumin: 4.1 g/dL (ref 3.5–5.0)
Alkaline Phosphatase: 69 U/L (ref 38–126)
Anion gap: 7 (ref 5–15)
BUN: 19 mg/dL (ref 8–23)
CO2: 31 mmol/L (ref 22–32)
Calcium: 10.2 mg/dL (ref 8.9–10.3)
Chloride: 105 mmol/L (ref 98–111)
Creatinine: 1.46 mg/dL — ABNORMAL HIGH (ref 0.44–1.00)
GFR, Estimated: 36 mL/min — ABNORMAL LOW (ref >60)
Glucose, Bld: 89 mg/dL (ref 70–99)
Potassium: 4.4 mmol/L (ref 3.5–5.1)
Sodium: 143 mmol/L (ref 135–145)
Total Bilirubin: 0.4 mg/dL (ref 0.3–1.2)
Total Protein: 6.7 g/dL (ref 6.5–8.1)

## 2021-12-13 LAB — CBC WITH DIFFERENTIAL (CANCER CENTER ONLY)
Abs Immature Granulocytes: 0.03 10*3/uL (ref 0.00–0.07)
Basophils Absolute: 0 10*3/uL (ref 0.0–0.1)
Basophils Relative: 0 %
Eosinophils Absolute: 0 10*3/uL (ref 0.0–0.5)
Eosinophils Relative: 0 %
HCT: 32.9 % — ABNORMAL LOW (ref 36.0–46.0)
Hemoglobin: 10.7 g/dL — ABNORMAL LOW (ref 12.0–15.0)
Immature Granulocytes: 1 %
Lymphocytes Relative: 26 %
Lymphs Abs: 1.5 10*3/uL (ref 0.7–4.0)
MCH: 32.8 pg (ref 26.0–34.0)
MCHC: 32.5 g/dL (ref 30.0–36.0)
MCV: 100.9 fL — ABNORMAL HIGH (ref 80.0–100.0)
Monocytes Absolute: 0.7 10*3/uL (ref 0.1–1.0)
Monocytes Relative: 11 %
Neutro Abs: 3.5 10*3/uL (ref 1.7–7.7)
Neutrophils Relative %: 62 %
Platelet Count: 126 10*3/uL — ABNORMAL LOW (ref 150–400)
RBC: 3.26 MIL/uL — ABNORMAL LOW (ref 3.87–5.11)
RDW: 13.6 % (ref 11.5–15.5)
WBC Count: 5.8 10*3/uL (ref 4.0–10.5)
nRBC: 0 % (ref 0.0–0.2)

## 2021-12-13 LAB — IRON AND IRON BINDING CAPACITY (CC-WL,HP ONLY)
Iron: 54 ug/dL (ref 28–170)
Saturation Ratios: 18 % (ref 10.4–31.8)
TIBC: 304 ug/dL (ref 250–450)
UIBC: 250 ug/dL (ref 148–442)

## 2021-12-13 LAB — FERRITIN: Ferritin: 66 ng/mL (ref 11–307)

## 2021-12-13 LAB — LACTATE DEHYDROGENASE: LDH: 160 U/L (ref 98–192)

## 2021-12-13 LAB — SAVE SMEAR(SSMR), FOR PROVIDER SLIDE REVIEW

## 2021-12-13 NOTE — Telephone Encounter (Signed)
-----   Message from [name], MD sent at 12/13/2021  2:20 PM EST ----- Call and let him know that the iron is dropping a little bit.  She may benefit from some IV iron to try to help her feel less fatigued.  Please set her up for next week.  Thanks.  [name]

## 2021-12-13 NOTE — Telephone Encounter (Signed)
Called and informed patient of results, patient verbalized understanding and denies any questions or concerns at this time. Message sent to scheduling to call for appointment.

## 2021-12-13 NOTE — Progress Notes (Signed)
Hematology and Oncology Follow Up Visit  [name] [id number] 1940-11-30 81 y.o. 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 [name] is here today for follow-up.  Overall, she seems to be doing okay although she still feels tired.  This could simply be from the Calquence and venetoclax.  It could also be from her being somewhat anemic.  We did check iron studies on her back in August.  Her ferritin was 90 with an iron saturation of 26%.  She has had no problems with fever.  She has had a little bit of a cough.  She has had some sinus issues.  Her last IgG level back in September was 681 mg/dL.  She has had no problems with bowels or bladder.  Thankfully, her son, who lives in [place], is doing well with respect to his small cell lung cancer therapy.  She has had no leg swelling.  There has been no rashes.  She has had no headache.  Overall, I would say that her performance status is probably ECOG 1.   Medications:  Allergies as of 12/13/2021       Reactions   Codeine Other (See Comments)   REACTION: makes her nervous, orTylenol #3   Ibuprofen Other (See Comments)   REACTION: nervous   Meperidine Hcl Nausea And Vomiting   Naproxen Sodium Other (See Comments)   REACTION: nervous   Tramadol Other (See Comments)   Insomnia         Medication List        Accurate as of December 13, 2021 11:02 AM. If you have any questions, ask your nurse or doctor.          STOP taking these medications    amoxicillin-clavulanate 875-125 MG tablet Commonly known as: AUGMENTIN Stopped by: [name], MD    ciprofloxacin-dexamethasone OTIC suspension Commonly known as: Ciprodex Stopped by: [name], MD       TAKE these medications    amLODipine 5 MG tablet Commonly known as: NORVASC TAKE 1 TABLET(5 MG) BY MOUTH DAILY   aspirin 81 MG chewable tablet Chew 1 tablet (81 mg total) by mouth daily.   atenolol 100 MG tablet Commonly known as: TENORMIN TAKE 1 TABLET(100 MG) BY MOUTH DAILY   atorvastatin 20 MG tablet Commonly known as: LIPITOR Take 1 tablet (20 mg total) by mouth daily.   azelastine 0.1 % nasal spray Commonly known as: ASTELIN Place 2 sprays into both nostrils at bedtime as needed for rhinitis or allergies. Use in each nostril as directed   Calquence 100 MG tablet Generic drug: acalabrutinib maleate Take 1 tablet (100 mg) by mouth daily.   cholecalciferol 1000 units tablet Commonly known as: VITAMIN D Take 1,000 Units by mouth daily.   clopidogrel 75 MG tablet Commonly known as: PLAVIX TAKE 1 TABLET(75 MG) BY MOUTH DAILY   fluticasone 50 MCG/ACT nasal spray Commonly known as: FLONASE SHAKE LIQUID AND USE 2 SPRAYS IN EACH NOSTRIL DAILY What changed: See the new instructions.   insulin NPH Human 100 UNIT/ML injection Commonly known as: NOVOLIN N Inject 36 units into the skin every morning  and inject 24 units into the skin at bedtime.   insulin regular 100 units/mL injection Commonly known as: NOVOLIN R Inject 10 Units into the skin 2 (two) times daily before a meal.   INSULIN SYRINGE .5CC/31GX5/16" 31G X 5/16" 0.5 ML Misc USE TO INJECT INSULIN 5 TIMES PER DAY   irbesartan 300 MG tablet Commonly known as: AVAPRO TAKE 1/2 TABLET(150 MG) BY MOUTH DAILY   isosorbide mononitrate 30 MG 24 hr tablet Commonly known as: IMDUR Take 1 tablet (30 mg total) by mouth daily.   metFORMIN 500 MG tablet Commonly known as: GLUCOPHAGE TAKE 1 TABLET(500 MG) BY MOUTH DAILY WITH SUPPER   mometasone 0.1 % cream Commonly known as: ELOCON Apply 1 application  topically daily. Use as directed   multivitamin capsule Take 1 capsule by mouth daily.   nitroGLYCERIN 0.3 MG SL tablet Commonly known as: Nitrostat Place 1 tablet (0.3 mg total) under the tongue every 5 (five) minutes as needed for chest pain (ER if no better after 3 tablets).   omeprazole 20 MG capsule Commonly known as: PRILOSEC TAKE 1 CAPSULE(20 MG) BY MOUTH DAILY   ondansetron 8 MG tablet Commonly known as: ZOFRAN Take 1 tablet (8 mg total) by mouth every 8 (eight) hours as needed for nausea or vomiting.   ONE TOUCH ULTRA SYSTEM KIT w/Device Kit 1 kit by Does not apply route once.   OneTouch Ultra test strip Generic drug: glucose blood USE AS DIRECTED THREE TIMES DAILY What changed: additional instructions   Venclexta 100 MG tablet Generic drug: venetoclax Take 1 tablet (100 mg total) by mouth daily. Tablets should be swallowed whole with a meal and a full glass of water.   Victoza 18 MG/3ML Sopn Generic drug: liraglutide ADMINISTER 1.2 MG UNDER THE SKIN DAILY        Allergies:  Allergies  Allergen Reactions   Codeine Other (See Comments)    REACTION: makes her nervous, orTylenol #3   Ibuprofen Other (See Comments)    REACTION: nervous   Meperidine Hcl Nausea And Vomiting   Naproxen Sodium Other (See Comments)    REACTION: nervous   Tramadol Other (See Comments)    Insomnia     Past Medical History, Surgical history, Social history, and Family History were reviewed and updated.  Review of Systems: Review of Systems  Constitutional: Negative.   HENT: Negative.    Eyes: Negative.   Respiratory: Negative.    Cardiovascular: Negative.   Gastrointestinal: Negative.   Genitourinary: Negative.   Musculoskeletal: Negative.   Skin: Negative.   Neurological: Negative.   Endo/Heme/Allergies: Negative.   Psychiatric/Behavioral: Negative.       Physical Exam:  height is _0  (1.676 m) and weight is 171 lb 1.3 oz (77.6 kg). Her oral temperature is 98 F  (36.7 C). Her blood pressure is 177/61 (abnormal, pended) and her pulse is 73. Her respiration is 20 and oxygen saturation is 100%.   Wt Readings from Last 3 Encounters:  12/13/21 171 lb 1.3 oz (77.6 kg)  10/27/21 177 lb (80.3 kg)  10/26/21 177 lb 6 oz (80.5 kg)    Physical Exam Vitals reviewed.  HENT:     Head: Normocephalic and atraumatic.  Eyes:     Pupils: Pupils are equal, round, and reactive to light.  Cardiovascular:     Rate and Rhythm: Normal rate and regular rhythm.     Heart sounds: Normal heart sounds.  Pulmonary:     Effort: Pulmonary effort is normal.  Breath sounds: Normal breath sounds.  Abdominal:     General: Bowel sounds are normal.     Palpations: Abdomen is soft.  Musculoskeletal:        General: No tenderness or deformity. Normal range of motion.     Cervical back: Normal range of motion.  Lymphadenopathy:     Cervical: No cervical adenopathy.  Skin:    General: Skin is warm and dry.     Findings: No erythema or rash.  Neurological:     Mental Status: She is alert and oriented to person, place, and time.  Psychiatric:        Behavior: Behavior normal.        Thought Content: Thought content normal.        Judgment: Judgment normal.      Lab Results  Component Value Date   WBC 5.8 12/13/2021   HGB 10.7 (L) 12/13/2021   HCT 32.9 (L) 12/13/2021   MCV 100.9 (H) 12/13/2021   PLT 126 (L) 12/13/2021   Lab Results  Component Value Date   FERRITIN 90 08/24/2020   IRON 81 08/24/2020   TIBC 310 08/24/2020   UIBC 229 08/24/2020   IRONPCTSAT 26 08/24/2020   Lab Results  Component Value Date   RETICCTPCT 1.9 08/24/2020   RBC 3.26 (L) 12/13/2021   Lab Results  Component Value Date   KPAFRELGTCHN 32.8 (H) 05/16/2021   LAMBDASER 26.4 (H) 05/16/2021   KAPLAMBRATIO 1.24 05/16/2021   Lab Results  Component Value Date   IGGSERUM 681 09/16/2021   IGA 137 09/16/2021   IGMSERUM 15 (L) 09/16/2021   Lab Results  Component Value Date    TOTALPROTELP 6.5 11/18/2020   ALBUMINELP 3.6 11/18/2020   A1GS 0.3 11/18/2020   A2GS 0.9 11/18/2020   BETS 1.0 11/18/2020   BETA2SER 6.2 02/25/2014   GAMS 0.7 11/18/2020   MSPIKE Not Observed 11/18/2020   SPEI * 02/25/2014     Chemistry      Component Value Date/Time   NA 143 12/13/2021 0937   NA 147 (H) 11/03/2016 1018   NA 143 08/30/2016 0954   K 4.4 12/13/2021 0937   K 3.9 11/03/2016 1018   K 4.5 08/30/2016 0954   CL 105 12/13/2021 0937   CL 110 (H) 11/03/2016 1018   CO2 31 12/13/2021 0937   CO2 27 11/03/2016 1018   CO2 24 08/30/2016 0954   BUN 19 12/13/2021 0937   BUN 25 (H) 11/03/2016 1018   BUN 20.5 08/30/2016 0954   CREATININE 1.46 (H) 12/13/2021 0937   CREATININE 1.6 (H) 11/03/2016 1018   CREATININE 1.4 (H) 08/30/2016 0954      Component Value Date/Time   CALCIUM 10.2 12/13/2021 0937   CALCIUM 10.1 11/03/2016 1018   CALCIUM 9.8 08/30/2016 0954   ALKPHOS 69 12/13/2021 0937   ALKPHOS 112 (H) 11/03/2016 1018   ALKPHOS 113 08/30/2016 0954   AST 18 12/13/2021 0937   AST 23 08/30/2016 0954   ALT 13 12/13/2021 0937   ALT 29 11/03/2016 1018   ALT 20 08/30/2016 0954   BILITOT 0.4 12/13/2021 0937   BILITOT 0.38 08/30/2016 0954      Impression and Plan: Ms. [name] is a very pleasant 81 yo caucasian female with CLL.  So far, we have not had any negative prognostic markers.  She does have the 13q-chromosomal abnormality.  This is typically construed as a good prognostic marker.  She currently is on acalabrutinib and venetoclax.  It is hard to say  whether or not the combination of the Calquence and venetoclax are causing her fatigue.  At some point, we may be able to stop the Calquence.  I think I would like to get another CT scan on her to see how everything looks.  Her last CT scan was back in March.  We will check her iron studies.  We have checked her erythropoietin level in the past.  It was not all that high.  I think the erythropoietin level was 12.  As  such, we clearly could use ESA on her.  She says she is going to have some eye surgery in January.  This is for some swelling I think behind the left eye.  From my point of view, I do not see a problem to her having surgery.  I would like to get her back to see [place] in another 3 months.  She will continue on this, for as long as she tolerates this.  Hopefully, we will be able to get her off the therapy sometime in early 2025.  I am just happy that her quality life is doing well.  We will certainly stay strong and prayer for her son.    [name], MD 12/12/202311:02 AM

## 2021-12-14 ENCOUNTER — Ambulatory Visit
Admission: RE | Admit: 2021-12-14 | Discharge: 2021-12-14 | Disposition: A | Discharge status: Home / Self Care | Payer: Medicare Other | Source: Ambulatory Visit | Attending: Internal Medicine | Admitting: Internal Medicine

## 2021-12-14 DIAGNOSIS — Z1231 Encounter for screening mammogram for malignant neoplasm of breast: Secondary | ICD-10-CM | POA: Diagnosis not present

## 2021-12-14 NOTE — Telephone Encounter (Signed)
.  Tried calling pt ot schedule- no answer. Pt ready for scheduling on or after 12/24/21   Out-of-pocket cost due at time of visit: $0- she has an appointment pending with Dr Larose Kells on 12/21, however that is a couple of days too soon for her injection.

## 2021-12-16 ENCOUNTER — Other Ambulatory Visit: Payer: Self-pay

## 2021-12-16 ENCOUNTER — Other Ambulatory Visit: Payer: Self-pay | Admitting: Internal Medicine

## 2021-12-16 ENCOUNTER — Other Ambulatory Visit (HOSPITAL_COMMUNITY): Payer: Self-pay

## 2021-12-16 DIAGNOSIS — R928 Other abnormal and inconclusive findings on diagnostic imaging of breast: Secondary | ICD-10-CM

## 2021-12-19 ENCOUNTER — Telehealth: Payer: Self-pay | Admitting: Internal Medicine

## 2021-12-19 NOTE — Telephone Encounter (Signed)
Spoke w/ Pt- made her aware Prolia must be 6 months plus 1 day, moved appt.

## 2021-12-19 NOTE — Telephone Encounter (Signed)
Left vm to return call.    

## 2021-12-19 NOTE — Telephone Encounter (Signed)
Patient said she received a message saying that her prolia would be due 12/23 or after. Patient has appt scheduled for 12/22/21 with Dr. Larose Kells and thought it was supposed for her prolia shot. She would like clarification on if she can get her prolia while here that day since she is still seeing Dr. Marin Olp right after that appt. Please call to advise

## 2021-12-22 ENCOUNTER — Ambulatory Visit: Payer: Medicare Other | Admitting: Internal Medicine

## 2021-12-22 ENCOUNTER — Inpatient Hospital Stay: Payer: Medicare Other

## 2021-12-22 VITALS — BP 158/65 | HR 67 | Temp 97.7°F | Resp 18

## 2021-12-22 DIAGNOSIS — D508 Other iron deficiency anemias: Secondary | ICD-10-CM

## 2021-12-22 DIAGNOSIS — C9110 Chronic lymphocytic leukemia of B-cell type not having achieved remission: Secondary | ICD-10-CM | POA: Diagnosis not present

## 2021-12-22 DIAGNOSIS — Z85038 Personal history of other malignant neoplasm of large intestine: Secondary | ICD-10-CM | POA: Diagnosis not present

## 2021-12-22 DIAGNOSIS — D509 Iron deficiency anemia, unspecified: Secondary | ICD-10-CM | POA: Diagnosis not present

## 2021-12-22 DIAGNOSIS — Q999 Chromosomal abnormality, unspecified: Secondary | ICD-10-CM | POA: Diagnosis not present

## 2021-12-22 MED ORDER — SODIUM CHLORIDE 0.9 % IV SOLN: INTRAVENOUS | Status: DC

## 2021-12-22 MED ORDER — SODIUM CHLORIDE 0.9 % IV SOLN
510.0000 mg | Freq: Once | INTRAVENOUS | Status: AC
  Administered 2021-12-22: 510 mg via INTRAVENOUS
  Filled 2021-12-22: qty 510

## 2021-12-22 NOTE — Patient Instructions (Signed)

## 2021-12-25 ENCOUNTER — Encounter (HOSPITAL_COMMUNITY): Payer: Self-pay

## 2021-12-25 ENCOUNTER — Other Ambulatory Visit: Payer: Self-pay

## 2021-12-25 ENCOUNTER — Emergency Department (HOSPITAL_COMMUNITY)
Admission: EM | Admit: 2021-12-25 | Discharge: 2021-12-25 | Disposition: A | Discharge status: Home / Self Care | Payer: Medicare Other | Attending: Emergency Medicine | Admitting: Emergency Medicine

## 2021-12-25 ENCOUNTER — Emergency Department (HOSPITAL_COMMUNITY): Payer: Medicare Other

## 2021-12-25 DIAGNOSIS — E1165 Type 2 diabetes mellitus with hyperglycemia: Secondary | ICD-10-CM | POA: Diagnosis not present

## 2021-12-25 DIAGNOSIS — S060X9A Concussion with loss of consciousness of unspecified duration, initial encounter: Secondary | ICD-10-CM | POA: Diagnosis not present

## 2021-12-25 DIAGNOSIS — Y998 Other external cause status: Secondary | ICD-10-CM | POA: Insufficient documentation

## 2021-12-25 DIAGNOSIS — Y92410 Unspecified street and highway as the place of occurrence of the external cause: Secondary | ICD-10-CM | POA: Insufficient documentation

## 2021-12-25 DIAGNOSIS — I2510 Atherosclerotic heart disease of native coronary artery without angina pectoris: Secondary | ICD-10-CM | POA: Insufficient documentation

## 2021-12-25 DIAGNOSIS — I129 Hypertensive chronic kidney disease with stage 1 through stage 4 chronic kidney disease, or unspecified chronic kidney disease: Secondary | ICD-10-CM | POA: Insufficient documentation

## 2021-12-25 DIAGNOSIS — S29001A Unspecified injury of muscle and tendon of front wall of thorax, initial encounter: Secondary | ICD-10-CM | POA: Diagnosis present

## 2021-12-25 DIAGNOSIS — R930 Abnormal findings on diagnostic imaging of skull and head, not elsewhere classified: Secondary | ICD-10-CM | POA: Insufficient documentation

## 2021-12-25 DIAGNOSIS — S301XXA Contusion of abdominal wall, initial encounter: Secondary | ICD-10-CM | POA: Diagnosis not present

## 2021-12-25 DIAGNOSIS — M25519 Pain in unspecified shoulder: Secondary | ICD-10-CM | POA: Diagnosis not present

## 2021-12-25 DIAGNOSIS — S20211A Contusion of right front wall of thorax, initial encounter: Secondary | ICD-10-CM | POA: Diagnosis not present

## 2021-12-25 DIAGNOSIS — S4991XA Unspecified injury of right shoulder and upper arm, initial encounter: Secondary | ICD-10-CM | POA: Insufficient documentation

## 2021-12-25 DIAGNOSIS — S20219A Contusion of unspecified front wall of thorax, initial encounter: Secondary | ICD-10-CM | POA: Insufficient documentation

## 2021-12-25 DIAGNOSIS — E162 Hypoglycemia, unspecified: Secondary | ICD-10-CM | POA: Diagnosis not present

## 2021-12-25 DIAGNOSIS — Z041 Encounter for examination and observation following transport accident: Secondary | ICD-10-CM | POA: Diagnosis not present

## 2021-12-25 DIAGNOSIS — M7989 Other specified soft tissue disorders: Secondary | ICD-10-CM | POA: Diagnosis not present

## 2021-12-25 DIAGNOSIS — N189 Chronic kidney disease, unspecified: Secondary | ICD-10-CM | POA: Diagnosis not present

## 2021-12-25 DIAGNOSIS — S8992XA Unspecified injury of left lower leg, initial encounter: Secondary | ICD-10-CM | POA: Diagnosis not present

## 2021-12-25 DIAGNOSIS — M5030 Other cervical disc degeneration, unspecified cervical region: Secondary | ICD-10-CM | POA: Insufficient documentation

## 2021-12-25 DIAGNOSIS — M25511 Pain in right shoulder: Secondary | ICD-10-CM | POA: Diagnosis not present

## 2021-12-25 LAB — CBG MONITORING, ED
Glucose-Capillary: 35 mg/dL — CL (ref 70–99)
Glucose-Capillary: 108 mg/dL — ABNORMAL HIGH (ref 70–99)
Glucose-Capillary: 79 mg/dL (ref 70–99)

## 2021-12-25 LAB — I-STAT CHEM 8, ED
BUN: 23 mg/dL (ref 8–23)
Calcium, Ion: 1.24 mmol/L (ref 1.15–1.40)
Chloride: 108 mmol/L (ref 98–111)
Creatinine, Ser: 1.5 mg/dL — ABNORMAL HIGH (ref 0.44–1.00)
Glucose, Bld: 39 mg/dL — CL (ref 70–99)
HCT: 25 % — ABNORMAL LOW (ref 36.0–46.0)
Hemoglobin: 8.5 g/dL — ABNORMAL LOW (ref 12.0–15.0)
Potassium: 3.5 mmol/L (ref 3.5–5.1)
Sodium: 141 mmol/L (ref 135–145)
TCO2: 24 mmol/L (ref 22–32)

## 2021-12-25 MED ORDER — ACETAMINOPHEN 500 MG PO TABS: 500.0000 mg | ORAL_TABLET | Freq: Four times a day (QID) | ORAL | 0 refills | Status: AC | PRN

## 2021-12-25 MED ORDER — ONDANSETRON 4 MG PO TBDP: 4.0000 mg | ORAL_TABLET | Freq: Once | ORAL | Status: DC

## 2021-12-25 MED ORDER — LIDOCAINE 5 % EX PTCH
1.0000 | MEDICATED_PATCH | CUTANEOUS | Status: DC
  Administered 2021-12-25: 1 via TRANSDERMAL
  Filled 2021-12-25: qty 1

## 2021-12-25 MED ORDER — HYDROCODONE-ACETAMINOPHEN 5-325 MG PO TABS: 2.0000 | ORAL_TABLET | ORAL | 0 refills | Status: DC | PRN

## 2021-12-25 MED ORDER — LIDOCAINE 5 % EX PTCH: 1.0000 | MEDICATED_PATCH | CUTANEOUS | 0 refills | Status: DC

## 2021-12-25 MED ORDER — ONDANSETRON HCL 4 MG/2ML IJ SOLN
4.0000 mg | Freq: Once | INTRAMUSCULAR | Status: AC
  Administered 2021-12-25: 4 mg via INTRAVENOUS
  Filled 2021-12-25: qty 2

## 2021-12-25 MED ORDER — DEXTROSE 50 % IV SOLN
INTRAVENOUS | Status: AC
  Administered 2021-12-25: 50 mL
  Filled 2021-12-25: qty 50

## 2021-12-25 MED ORDER — ACETAMINOPHEN 500 MG PO TABS: 500.0000 mg | ORAL_TABLET | Freq: Four times a day (QID) | ORAL | 0 refills | Status: DC | PRN

## 2021-12-25 MED ORDER — IOHEXOL 300 MG/ML  SOLN
75.0000 mL | Freq: Once | INTRAMUSCULAR | Status: AC | PRN
  Administered 2021-12-25: 75 mL via INTRAVENOUS

## 2021-12-25 MED ORDER — OXYCODONE-ACETAMINOPHEN 5-325 MG PO TABS
1.0000 | ORAL_TABLET | Freq: Once | ORAL | Status: AC
  Administered 2021-12-25: 1 via ORAL
  Filled 2021-12-25: qty 1

## 2021-12-25 MED ORDER — FENTANYL CITRATE PF 50 MCG/ML IJ SOSY
25.0000 ug | PREFILLED_SYRINGE | Freq: Once | INTRAMUSCULAR | Status: AC
  Administered 2021-12-25: 25 ug via INTRAVENOUS
  Filled 2021-12-25: qty 1

## 2021-12-25 MED ORDER — HYDROCODONE-ACETAMINOPHEN 5-325 MG PO TABS: 1.0000 | ORAL_TABLET | Freq: Four times a day (QID) | ORAL | 0 refills | Status: DC | PRN
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
## 2021-12-25 NOTE — ED Notes (Signed)
Bruising and swelling noted to patient's right breast. Pt A&Ox4.
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
## 2021-12-27 ENCOUNTER — Ambulatory Visit: Payer: Medicare Other | Admitting: Internal Medicine

## 2021-12-28 ENCOUNTER — Ambulatory Visit (HOSPITAL_BASED_OUTPATIENT_CLINIC_OR_DEPARTMENT_OTHER)
Admission: RE | Admit: 2021-12-28 | Discharge: 2021-12-28 | Disposition: A | Discharge status: Home / Self Care | Payer: Medicare Other | Source: Ambulatory Visit | Attending: Internal Medicine | Admitting: Internal Medicine

## 2021-12-28 ENCOUNTER — Ambulatory Visit (INDEPENDENT_AMBULATORY_CARE_PROVIDER_SITE_OTHER): Payer: Medicare Other | Admitting: Internal Medicine

## 2021-12-28 ENCOUNTER — Encounter: Payer: Self-pay | Admitting: Internal Medicine

## 2021-12-28 DIAGNOSIS — I25118 Atherosclerotic heart disease of native coronary artery with other forms of angina pectoris: Secondary | ICD-10-CM | POA: Diagnosis not present

## 2021-12-28 DIAGNOSIS — M7989 Other specified soft tissue disorders: Secondary | ICD-10-CM

## 2021-12-28 DIAGNOSIS — S92354A Nondisplaced fracture of fifth metatarsal bone, right foot, initial encounter for closed fracture: Secondary | ICD-10-CM | POA: Diagnosis not present

## 2021-12-28 DIAGNOSIS — M79671 Pain in right foot: Secondary | ICD-10-CM | POA: Diagnosis not present

## 2021-12-28 MED ORDER — HYDROCODONE-ACETAMINOPHEN 5-325 MG PO TABS: 1.0000 | ORAL_TABLET | Freq: Three times a day (TID) | ORAL | 0 refills | Status: DC | PRN
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
## 2021-12-28 NOTE — Patient Instructions (Addendum)
Go to the first floor. Will get a ultrasound of your right leg and a x-ray of your right foot.  Tylenol  500 mg OTC 2 tabs a day every 8 hours as needed for pain If the pain is severe, instead of Tylenol take hydrocodone, I sent a prescription.  Put a cold compress on the right leg  If you develop headache or the nausea increases: Call the office or go to the ER  Come back for a checkup in 2 weeks

## 2021-12-29 ENCOUNTER — Telehealth: Payer: Self-pay | Admitting: *Deleted

## 2021-12-29 NOTE — Telephone Encounter (Signed)
     Patient  visit on 12/25/2021  at [facility] for [facility]  Have you been able to follow up with your primary care physician? Saw her dr yesterday but is still feeling pretty bad and has a fractured foot .  The patient was or was not able to obtain any needed medicine or equipment.  Are there diet recommendations that you are having difficulty following?  Patient expresses understanding of discharge instructions and education provided has no other needs at this time.    [facility] [phone] [address] , [place] [address] Email : [email or web address]

## 2021-12-30 ENCOUNTER — Ambulatory Visit (INDEPENDENT_AMBULATORY_CARE_PROVIDER_SITE_OTHER): Payer: Medicare Other | Admitting: Physician Assistant

## 2021-12-30 ENCOUNTER — Encounter: Payer: Self-pay | Admitting: Physician Assistant

## 2021-12-30 DIAGNOSIS — S92301A Fracture of unspecified metatarsal bone(s), right foot, initial encounter for closed fracture: Secondary | ICD-10-CM | POA: Insufficient documentation
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
## 2022-01-03 ENCOUNTER — Telehealth: Payer: Self-pay | Admitting: *Deleted

## 2022-01-03 NOTE — Telephone Encounter (Signed)
   Pre-operative Risk Assessment    Patient Name: [name]  DOB: 10/13/1940 MRN: [id number]      Request for Surgical Clearance    Procedure:   FOCAL PHOTOCOAGULATION LASER FOR THE LEFT EYE  Date of Surgery:  Clearance TBD                                 Surgeon:  DR. Sherlynn Stalls Surgeon's Group or Practice Name:  [facility] Phone number:   Fax number:     Type of Clearance Requested:   - Medical  - Pharmacy:  Hold Aspirin and Clopidogrel (Plavix) (PT IS ON THESE MEDS THOUGH THE CLEARANCE REQUEST IS NOT INDICATING IF THESE NEED TO BE HELD OR NOT)   Type of Anesthesia:  None  PER THE CLEARANCE REQUEST NO ANESTHESIA TO BE USED   Additional requests/questions:    Jiles Prows   01/03/2022, 5:33 PM

## 2022-01-04 ENCOUNTER — Ambulatory Visit: Payer: Medicare Other | Admitting: Internal Medicine

## 2022-01-04 ENCOUNTER — Telehealth: Payer: Self-pay

## 2022-01-04 ENCOUNTER — Encounter: Payer: Self-pay | Admitting: Family

## 2022-01-04 NOTE — Telephone Encounter (Signed)
   Name: [name]  DOB: 1940-03-15  MRN: [id number]  Primary Cardiologist: Quay Burow, MD   Preoperative team, please contact this patient and set up a phone call appointment for further preoperative risk assessment. Please obtain consent and complete medication review. Thank you for your help.  I confirm that guidance regarding antiplatelet and oral anticoagulation therapy has been completed and, if necessary, noted below.  Per protocol patient may hold ASA and Plavix for 5 to 7 days if indicated.  Mable Fill, Marissa Nestle, NP 01/04/2022, [place]

## 2022-01-04 NOTE — Telephone Encounter (Signed)
Pt was contacted regarding setting up a "tele visit' for surgical clearance.  Pt is scheduled to see Dr. Gwenlyn Found 01/11/22 already, so will let him address clearance at that time.

## 2022-01-04 NOTE — Telephone Encounter (Signed)
Patient states that her insurance informed her that Victoza will no longer be covered this year and will need an alternative medication. Will she need to discuss this at her upcoming appt on 2/14?

## 2022-01-09 ENCOUNTER — Ambulatory Visit (INDEPENDENT_AMBULATORY_CARE_PROVIDER_SITE_OTHER): Payer: Medicare Other

## 2022-01-09 ENCOUNTER — Ambulatory Visit (INDEPENDENT_AMBULATORY_CARE_PROVIDER_SITE_OTHER): Payer: Medicare Other | Admitting: Physician Assistant

## 2022-01-09 ENCOUNTER — Encounter: Payer: Self-pay | Admitting: Physician Assistant

## 2022-01-09 ENCOUNTER — Other Ambulatory Visit (HOSPITAL_COMMUNITY): Payer: Self-pay

## 2022-01-09 DIAGNOSIS — S92301A Fracture of unspecified metatarsal bone(s), right foot, initial encounter for closed fracture: Secondary | ICD-10-CM
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
## 2022-01-10 ENCOUNTER — Other Ambulatory Visit (HOSPITAL_COMMUNITY): Payer: Self-pay

## 2022-01-10 NOTE — Telephone Encounter (Signed)
Attempted to call patient but no answer.

## 2022-01-11 ENCOUNTER — Encounter: Payer: Self-pay | Admitting: Cardiovascular Disease

## 2022-01-11 ENCOUNTER — Ambulatory Visit: Payer: Medicare Other | Attending: Cardiovascular Disease | Admitting: Cardiovascular Disease

## 2022-01-11 VITALS — BP 128/48 | HR 64 | Ht 66.0 in | Wt 168.0 lb

## 2022-01-11 DIAGNOSIS — E785 Hyperlipidemia, unspecified: Secondary | ICD-10-CM | POA: Diagnosis not present

## 2022-01-11 DIAGNOSIS — I152 Hypertension secondary to endocrine disorders: Secondary | ICD-10-CM | POA: Diagnosis not present

## 2022-01-11 DIAGNOSIS — E1159 Type 2 diabetes mellitus with other circulatory complications: Secondary | ICD-10-CM

## 2022-01-11 DIAGNOSIS — E1169 Type 2 diabetes mellitus with other specified complication: Secondary | ICD-10-CM | POA: Diagnosis not present

## 2022-01-11 DIAGNOSIS — I25118 Atherosclerotic heart disease of native coronary artery with other forms of angina pectoris: Secondary | ICD-10-CM

## 2022-01-11 NOTE — Assessment & Plan Note (Signed)
History of CAD status post STEMI 1997 which time I performed cardiac catheterization and apparently placed a balloon pump in her at that time she had bypass grafting x 5 by Dr. Darcey Nora.  She had a catheterization by Dr. Rollene Fare December 2008 that showed patent grafts as well as 09/16/2015.  Myoview performed 05/12/2019 was nonischemic.  She denies chest pain.

## 2022-01-11 NOTE — Telephone Encounter (Signed)
Called patient again to day. No answer and voicemail full on mobile number

## 2022-01-11 NOTE — Assessment & Plan Note (Signed)
History of hyperlipidemia on statin therapy with lipid profile performed 06/22/2021 revealing total cholesterol 120, LDL 41 HDL 36.

## 2022-01-11 NOTE — Assessment & Plan Note (Signed)
History of essential hypertension blood pressure measured today at 128/48.  She is on atenolol, amlodipine.
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
## 2022-01-11 NOTE — Patient Instructions (Signed)
    Follow-Up: At [facility], you and your health needs are our priority.  As part of our continuing mission to provide you with exceptional heart care, we have created designated Provider Care Teams.  These Care Teams include your primary Cardiologist (physician) and Advanced Practice Providers (APPs -  Physician Assistants and Nurse Practitioners) who all work together to provide you with the care you need, when you need it.  We recommend signing up for the patient portal called "MyChart".  Sign up information is provided on this After Visit Summary.  MyChart is used to connect with patients for Virtual Visits (Telemedicine).  Patients are able to view lab/test results, encounter notes, upcoming appointments, etc.  Non-urgent messages can be sent to your provider as well.   To learn more about what you can do with MyChart, go to [email or web address].    Your next appointment:   12 month(s)  The format for your next appointment:   In Person  Provider:   Quay Burow, MD

## 2022-01-13 ENCOUNTER — Encounter: Payer: Self-pay | Admitting: Internal Medicine

## 2022-01-13 ENCOUNTER — Ambulatory Visit (INDEPENDENT_AMBULATORY_CARE_PROVIDER_SITE_OTHER): Payer: Medicare Other | Admitting: Internal Medicine

## 2022-01-13 DIAGNOSIS — Z23 Encounter for immunization: Secondary | ICD-10-CM | POA: Diagnosis not present

## 2022-01-13 DIAGNOSIS — S2001XD Contusion of right breast, subsequent encounter: Secondary | ICD-10-CM

## 2022-01-13 DIAGNOSIS — M8000XD Age-related osteoporosis with current pathological fracture, unspecified site, subsequent encounter for fracture with routine healing: Secondary | ICD-10-CM

## 2022-01-13 DIAGNOSIS — K625 Hemorrhage of anus and rectum: Secondary | ICD-10-CM

## 2022-01-13 DIAGNOSIS — I25118 Atherosclerotic heart disease of native coronary artery with other forms of angina pectoris: Secondary | ICD-10-CM

## 2022-01-13 LAB — CBC WITH DIFFERENTIAL/PLATELET
Basophils Absolute: 0 10*3/uL (ref 0.0–0.1)
Basophils Relative: 0.2 % (ref 0.0–3.0)
Eosinophils Absolute: 0 10*3/uL (ref 0.0–0.7)
Eosinophils Relative: 0.2 % (ref 0.0–5.0)
HCT: 27.8 % — ABNORMAL LOW (ref 36.0–46.0)
Hemoglobin: 9.1 g/dL — ABNORMAL LOW (ref 12.0–15.0)
Lymphocytes Relative: 15.8 % (ref 12.0–46.0)
Lymphs Abs: 1.2 10*3/uL (ref 0.7–4.0)
MCHC: 32.6 g/dL (ref 30.0–36.0)
MCV: 101.6 fl — ABNORMAL HIGH (ref 78.0–100.0)
Monocytes Absolute: 0.7 10*3/uL (ref 0.1–1.0)
Monocytes Relative: 8.6 % (ref 3.0–12.0)
Neutro Abs: 5.9 10*3/uL (ref 1.4–7.7)
Neutrophils Relative %: 75.2 % (ref 43.0–77.0)
Platelets: 175 10*3/uL (ref 150.0–400.0)
RBC: 2.74 Mil/uL — ABNORMAL LOW (ref 3.87–5.11)
RDW: 16.8 % — ABNORMAL HIGH (ref 11.5–15.5)
WBC: 7.9 10*3/uL (ref 4.0–10.5)

## 2022-01-13 MED ORDER — DENOSUMAB 60 MG/ML ~~LOC~~ SOSY
60.0000 mg | PREFILLED_SYRINGE | Freq: Once | SUBCUTANEOUS | Status: AC
  Administered 2022-01-13: 60 mg via SUBCUTANEOUS

## 2022-01-13 NOTE — Patient Instructions (Addendum)
Please let me know if you have more blood in the stools or any other stomach problems.   GO TO THE LAB : Get the blood work     Zena, [place] back for a checkup in 3 months
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
## 2022-01-16 ENCOUNTER — Telehealth: Payer: Self-pay

## 2022-01-16 ENCOUNTER — Other Ambulatory Visit: Payer: Self-pay | Admitting: Endocrinology

## 2022-01-16 DIAGNOSIS — E1165 Type 2 diabetes mellitus with hyperglycemia: Secondary | ICD-10-CM

## 2022-01-16 NOTE — Telephone Encounter (Signed)
Pt received Prolia injection on 01/13/22 Next injection is due on 07/14/22

## 2022-01-18 ENCOUNTER — Other Ambulatory Visit (HOSPITAL_COMMUNITY): Payer: Self-pay

## 2022-01-23 ENCOUNTER — Ambulatory Visit: Payer: Medicare Other | Admitting: Physician Assistant

## 2022-01-25 ENCOUNTER — Ambulatory Visit: Payer: Self-pay

## 2022-01-25 ENCOUNTER — Ambulatory Visit (INDEPENDENT_AMBULATORY_CARE_PROVIDER_SITE_OTHER): Payer: Medicare Other

## 2022-01-25 ENCOUNTER — Encounter: Payer: Self-pay | Admitting: Physician Assistant

## 2022-01-25 ENCOUNTER — Other Ambulatory Visit: Payer: Self-pay | Admitting: Endocrinology

## 2022-01-25 ENCOUNTER — Ambulatory Visit (INDEPENDENT_AMBULATORY_CARE_PROVIDER_SITE_OTHER): Payer: Medicare Other | Admitting: Physician Assistant

## 2022-01-25 ENCOUNTER — Other Ambulatory Visit: Payer: Self-pay

## 2022-01-25 DIAGNOSIS — S92301A Fracture of unspecified metatarsal bone(s), right foot, initial encounter for closed fracture: Secondary | ICD-10-CM

## 2022-01-25 DIAGNOSIS — E1165 Type 2 diabetes mellitus with hyperglycemia: Secondary | ICD-10-CM

## 2022-01-25 DIAGNOSIS — M79604 Pain in right leg: Secondary | ICD-10-CM | POA: Diagnosis not present

## 2022-01-25 MED ORDER — SEMAGLUTIDE(0.25 OR 0.5MG/DOS) 2 MG/3ML ~~LOC~~ SOPN: PEN_INJECTOR | SUBCUTANEOUS | 0 refills | Status: DC
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
## 2022-01-27 NOTE — Telephone Encounter (Signed)
I will forward to pre op APP to review if the pt has been cleared.

## 2022-01-27 NOTE — Telephone Encounter (Signed)
Caller is following-up on cardiac clearance for this patient.

## 2022-01-29 ENCOUNTER — Other Ambulatory Visit: Payer: Self-pay | Admitting: Internal Medicine

## 2022-01-30 NOTE — Telephone Encounter (Signed)
   Primary Cardiologist: Quay Burow, MD  Chart reviewed as part of pre-operative protocol coverage. Given past medical history and time since last visit, based on ACC/AHA guidelines, [name] would be at acceptable risk for the planned procedure without further cardiovascular testing.   Patient was advised that if she develops new symptoms prior to surgery to contact our office to arrange a follow-up appointment.  He verbalized understanding.  Per office protocol, she may hold Plavix for 5 days prior to procedure. Ideally aspirin should be continued without interruption, however if the bleeding risk is too great, aspirin may be held for 7 days prior to surgery. Please resume aspirin post operatively when it is felt to be safe from a bleeding standpoint.   I will route this recommendation to the requesting party via Epic fax function and remove from pre-op pool.  Please call with questions.  Emmaline Life, NP-C 01/30/2022, 8:13 AM [address], [address] Office [phone] Fax [phone]

## 2022-02-07 DIAGNOSIS — E113312 Type 2 diabetes mellitus with moderate nonproliferative diabetic retinopathy with macular edema, left eye: Secondary | ICD-10-CM | POA: Diagnosis not present

## 2022-02-15 ENCOUNTER — Encounter: Payer: Self-pay | Admitting: Family

## 2022-02-15 ENCOUNTER — Encounter: Payer: Self-pay | Admitting: Endocrinology

## 2022-02-15 ENCOUNTER — Ambulatory Visit (INDEPENDENT_AMBULATORY_CARE_PROVIDER_SITE_OTHER): Payer: Medicare Other | Admitting: Endocrinology

## 2022-02-15 ENCOUNTER — Other Ambulatory Visit (HOSPITAL_COMMUNITY): Payer: Self-pay

## 2022-02-15 VITALS — BP 146/72 | HR 61 | Ht 66.0 in | Wt 172.0 lb

## 2022-02-15 DIAGNOSIS — Z794 Long term (current) use of insulin: Secondary | ICD-10-CM | POA: Diagnosis not present

## 2022-02-15 DIAGNOSIS — I25118 Atherosclerotic heart disease of native coronary artery with other forms of angina pectoris: Secondary | ICD-10-CM | POA: Diagnosis not present

## 2022-02-15 DIAGNOSIS — E1165 Type 2 diabetes mellitus with hyperglycemia: Secondary | ICD-10-CM | POA: Diagnosis not present

## 2022-02-15 LAB — POCT GLYCOSYLATED HEMOGLOBIN (HGB A1C): Hemoglobin A1C: 4.6 % (ref 4.0–5.6)

## 2022-02-15 NOTE — Patient Instructions (Addendum)
REDUCE BEDTIME DOSE TO 20
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
## 2022-02-16 ENCOUNTER — Other Ambulatory Visit: Payer: Self-pay

## 2022-02-17 ENCOUNTER — Encounter (HOSPITAL_BASED_OUTPATIENT_CLINIC_OR_DEPARTMENT_OTHER): Payer: Self-pay

## 2022-02-17 ENCOUNTER — Telehealth: Payer: Self-pay | Admitting: *Deleted

## 2022-02-17 ENCOUNTER — Ambulatory Visit (HOSPITAL_BASED_OUTPATIENT_CLINIC_OR_DEPARTMENT_OTHER)
Admission: RE | Admit: 2022-02-17 | Discharge: 2022-02-17 | Disposition: A | Discharge status: Home / Self Care | Payer: Medicare Other | Source: Ambulatory Visit | Attending: Hematology & Oncology | Admitting: Hematology & Oncology

## 2022-02-17 DIAGNOSIS — M4316 Spondylolisthesis, lumbar region: Secondary | ICD-10-CM | POA: Diagnosis not present

## 2022-02-17 DIAGNOSIS — K8020 Calculus of gallbladder without cholecystitis without obstruction: Secondary | ICD-10-CM | POA: Diagnosis not present

## 2022-02-17 DIAGNOSIS — E785 Hyperlipidemia, unspecified: Secondary | ICD-10-CM | POA: Insufficient documentation

## 2022-02-17 DIAGNOSIS — K909 Intestinal malabsorption, unspecified: Secondary | ICD-10-CM | POA: Insufficient documentation

## 2022-02-17 DIAGNOSIS — C9110 Chronic lymphocytic leukemia of B-cell type not having achieved remission: Secondary | ICD-10-CM | POA: Insufficient documentation

## 2022-02-17 DIAGNOSIS — K760 Fatty (change of) liver, not elsewhere classified: Secondary | ICD-10-CM | POA: Diagnosis not present

## 2022-02-17 DIAGNOSIS — E1169 Type 2 diabetes mellitus with other specified complication: Secondary | ICD-10-CM | POA: Diagnosis not present

## 2022-02-17 DIAGNOSIS — N289 Disorder of kidney and ureter, unspecified: Secondary | ICD-10-CM | POA: Diagnosis not present

## 2022-02-17 DIAGNOSIS — I700 Atherosclerosis of aorta: Secondary | ICD-10-CM | POA: Diagnosis not present

## 2022-02-17 DIAGNOSIS — J984 Other disorders of lung: Secondary | ICD-10-CM | POA: Diagnosis not present

## 2022-02-17 LAB — POCT I-STAT CREATININE: Creatinine, Ser: 1.6 mg/dL — ABNORMAL HIGH (ref 0.44–1.00)

## 2022-02-17 MED ORDER — IOHEXOL 300 MG/ML  SOLN
80.0000 mL | Freq: Once | INTRAMUSCULAR | Status: AC | PRN
  Administered 2022-02-17: 80 mL via INTRAVENOUS

## 2022-02-17 NOTE — Telephone Encounter (Signed)
Pt notified per order of Dr. Marin Olp "that the CT scan does not show any abnormal lymph nodes now.  This is fantastic news."  Pt is appreciative of call and has no questions or concerns at this time.

## 2022-02-17 NOTE — Telephone Encounter (Signed)
-----   Message from [name], MD sent at 02/17/2022  4:00 PM EST ----- Please call let her know that the CT scan does not show any abnormal lymph nodes now.  This is fantastic news.  Thanks.  [name]

## 2022-02-20 ENCOUNTER — Telehealth: Payer: Self-pay | Admitting: *Deleted

## 2022-02-20 ENCOUNTER — Other Ambulatory Visit: Payer: Self-pay

## 2022-02-20 ENCOUNTER — Encounter: Payer: Self-pay | Admitting: Hematology & Oncology

## 2022-02-20 ENCOUNTER — Inpatient Hospital Stay (HOSPITAL_BASED_OUTPATIENT_CLINIC_OR_DEPARTMENT_OTHER): Payer: Medicare Other | Admitting: Hematology & Oncology

## 2022-02-20 ENCOUNTER — Inpatient Hospital Stay: Payer: Medicare Other | Attending: Hematology & Oncology

## 2022-02-20 VITALS — BP 127/59 | HR 66 | Temp 97.4°F | Resp 20 | Ht 66.0 in | Wt 168.8 lb

## 2022-02-20 DIAGNOSIS — D509 Iron deficiency anemia, unspecified: Secondary | ICD-10-CM | POA: Diagnosis not present

## 2022-02-20 DIAGNOSIS — E1169 Type 2 diabetes mellitus with other specified complication: Secondary | ICD-10-CM

## 2022-02-20 DIAGNOSIS — Z85038 Personal history of other malignant neoplasm of large intestine: Secondary | ICD-10-CM | POA: Insufficient documentation

## 2022-02-20 DIAGNOSIS — Z7969 Long term (current) use of other immunomodulators and immunosuppressants: Secondary | ICD-10-CM | POA: Insufficient documentation

## 2022-02-20 DIAGNOSIS — C9110 Chronic lymphocytic leukemia of B-cell type not having achieved remission: Secondary | ICD-10-CM

## 2022-02-20 DIAGNOSIS — Z79899 Other long term (current) drug therapy: Secondary | ICD-10-CM | POA: Diagnosis not present

## 2022-02-20 DIAGNOSIS — E119 Type 2 diabetes mellitus without complications: Secondary | ICD-10-CM | POA: Diagnosis not present

## 2022-02-20 DIAGNOSIS — K909 Intestinal malabsorption, unspecified: Secondary | ICD-10-CM

## 2022-02-20 LAB — CMP (CANCER CENTER ONLY)
ALT: 10 U/L (ref 0–44)
AST: 16 U/L (ref 15–41)
Albumin: 4.1 g/dL (ref 3.5–5.0)
Alkaline Phosphatase: 69 U/L (ref 38–126)
Anion gap: 7 (ref 5–15)
BUN: 19 mg/dL (ref 8–23)
CO2: 28 mmol/L (ref 22–32)
Calcium: 9.9 mg/dL (ref 8.9–10.3)
Chloride: 107 mmol/L (ref 98–111)
Creatinine: 1.46 mg/dL — ABNORMAL HIGH (ref 0.44–1.00)
GFR, Estimated: 36 mL/min — ABNORMAL LOW (ref >60)
Glucose, Bld: 60 mg/dL — ABNORMAL LOW (ref 70–99)
Potassium: 4.4 mmol/L (ref 3.5–5.1)
Sodium: 142 mmol/L (ref 135–145)
Total Bilirubin: 0.4 mg/dL (ref 0.3–1.2)
Total Protein: 6.8 g/dL (ref 6.5–8.1)

## 2022-02-20 LAB — TSH: TSH: 1.74 u[IU]/mL (ref 0.350–4.500)

## 2022-02-20 LAB — CBC WITH DIFFERENTIAL (CANCER CENTER ONLY)
Abs Immature Granulocytes: 0.04 10*3/uL (ref 0.00–0.07)
Basophils Absolute: 0 10*3/uL (ref 0.0–0.1)
Basophils Relative: 0 %
Eosinophils Absolute: 0 10*3/uL (ref 0.0–0.5)
Eosinophils Relative: 0 %
HCT: 33.6 % — ABNORMAL LOW (ref 36.0–46.0)
Hemoglobin: 10.8 g/dL — ABNORMAL LOW (ref 12.0–15.0)
Immature Granulocytes: 1 %
Lymphocytes Relative: 21 %
Lymphs Abs: 1.4 10*3/uL (ref 0.7–4.0)
MCH: 33.3 pg (ref 26.0–34.0)
MCHC: 32.1 g/dL (ref 30.0–36.0)
MCV: 103.7 fL — ABNORMAL HIGH (ref 80.0–100.0)
Monocytes Absolute: 0.8 10*3/uL (ref 0.1–1.0)
Monocytes Relative: 12 %
Neutro Abs: 4.2 10*3/uL (ref 1.7–7.7)
Neutrophils Relative %: 66 %
Platelet Count: 127 10*3/uL — ABNORMAL LOW (ref 150–400)
RBC: 3.24 MIL/uL — ABNORMAL LOW (ref 3.87–5.11)
RDW: 13.6 % (ref 11.5–15.5)
WBC Count: 6.5 10*3/uL (ref 4.0–10.5)
nRBC: 0 % (ref 0.0–0.2)

## 2022-02-20 LAB — LACTATE DEHYDROGENASE: LDH: 145 U/L (ref 98–192)

## 2022-02-20 LAB — SAVE SMEAR(SSMR), FOR PROVIDER SLIDE REVIEW

## 2022-02-20 LAB — IRON AND IRON BINDING CAPACITY (CC-WL,HP ONLY)
Iron: 63 ug/dL (ref 28–170)
Saturation Ratios: 21 % (ref 10.4–31.8)
TIBC: 304 ug/dL (ref 250–450)
UIBC: 241 ug/dL (ref 148–442)

## 2022-02-20 LAB — FERRITIN: Ferritin: 109 ng/mL (ref 11–307)

## 2022-02-20 NOTE — Telephone Encounter (Signed)
-----   Message from [name], MD sent at 02/20/2022  2:02 PM EST ----- Please call and let her know that the iron level is okay.  Thanks.  [name]

## 2022-02-20 NOTE — Telephone Encounter (Signed)
Pt notified. No concerns at this time.
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
## 2022-02-21 ENCOUNTER — Other Ambulatory Visit: Payer: Self-pay | Admitting: Family

## 2022-02-21 LAB — IGG, IGA, IGM
IgA: 181 mg/dL (ref 64–422)
IgG (Immunoglobin G), Serum: 900 mg/dL (ref 586–1602)
IgM (Immunoglobulin M), Srm: 19 mg/dL — ABNORMAL LOW (ref 26–217)

## 2022-02-21 LAB — SURGICAL PATHOLOGY

## 2022-02-22 ENCOUNTER — Ambulatory Visit: Payer: Medicare Other | Admitting: Physician Assistant

## 2022-02-23 LAB — FLOW CYTOMETRY

## 2022-02-24 ENCOUNTER — Other Ambulatory Visit: Payer: Self-pay

## 2022-02-24 MED ORDER — ATENOLOL 100 MG PO TABS: ORAL_TABLET | ORAL | 1 refills | Status: DC

## 2022-03-01 ENCOUNTER — Other Ambulatory Visit: Payer: Self-pay | Admitting: Internal Medicine

## 2022-03-01 ENCOUNTER — Ambulatory Visit
Admission: RE | Admit: 2022-03-01 | Discharge: 2022-03-01 | Disposition: A | Discharge status: Home / Self Care | Payer: Medicare Other | Source: Ambulatory Visit | Attending: Internal Medicine | Admitting: Internal Medicine

## 2022-03-01 DIAGNOSIS — R921 Mammographic calcification found on diagnostic imaging of breast: Secondary | ICD-10-CM | POA: Diagnosis not present

## 2022-03-01 DIAGNOSIS — R928 Other abnormal and inconclusive findings on diagnostic imaging of breast: Secondary | ICD-10-CM

## 2022-03-10 ENCOUNTER — Ambulatory Visit
Admission: RE | Admit: 2022-03-10 | Discharge: 2022-03-10 | Disposition: A | Discharge status: Home / Self Care | Payer: Medicare Other | Source: Ambulatory Visit | Attending: Internal Medicine | Admitting: Internal Medicine

## 2022-03-10 DIAGNOSIS — R921 Mammographic calcification found on diagnostic imaging of breast: Secondary | ICD-10-CM | POA: Diagnosis not present

## 2022-03-10 DIAGNOSIS — D0512 Intraductal carcinoma in situ of left breast: Secondary | ICD-10-CM | POA: Diagnosis not present

## 2022-03-10 HISTORY — PX: BREAST BIOPSY: SHX20

## 2022-03-14 ENCOUNTER — Other Ambulatory Visit: Payer: Self-pay | Admitting: Hematology & Oncology

## 2022-03-14 ENCOUNTER — Other Ambulatory Visit (HOSPITAL_COMMUNITY): Payer: Self-pay

## 2022-03-14 DIAGNOSIS — C9110 Chronic lymphocytic leukemia of B-cell type not having achieved remission: Secondary | ICD-10-CM

## 2022-03-15 ENCOUNTER — Other Ambulatory Visit: Payer: Self-pay

## 2022-03-15 ENCOUNTER — Other Ambulatory Visit (HOSPITAL_COMMUNITY): Payer: Self-pay

## 2022-03-15 MED ORDER — VENETOCLAX 100 MG PO TABS
100.0000 mg | ORAL_TABLET | Freq: Every day | ORAL | 3 refills | Status: DC
  Filled 2022-03-15: qty 28, 28d supply, fill #0
  Filled 2022-04-11 – 2022-04-14 (×2): qty 28, 28d supply, fill #1
  Filled 2022-06-12: qty 28, 28d supply, fill #2
  Filled 2022-07-05 – 2022-07-13 (×2): qty 28, 28d supply, fill #3

## 2022-03-16 ENCOUNTER — Other Ambulatory Visit (HOSPITAL_COMMUNITY): Payer: Self-pay

## 2022-03-17 ENCOUNTER — Ambulatory Visit: Payer: Self-pay | Admitting: Surgery

## 2022-03-17 DIAGNOSIS — D0512 Intraductal carcinoma in situ of left breast: Secondary | ICD-10-CM | POA: Diagnosis not present

## 2022-03-24 NOTE — Progress Notes (Signed)
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 Brantley Myers on 03-17-22 History of Present Illness: [name] is a 82 y.o. female who is seen today as an office consultation for evaluation of New Consultation and Breast Cancer  Patient sent for evaluation of abnormal mammogram. The patient was noted to have an abnormal cluster of left breast microcalcifications that were centrally located measuring 2 cm. 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 Denies any history of breast discharge or pain. 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 No history of breast mass and is sore at the biopsy site.   Assessment and Plan:  Diagnoses and all orders for this visit:  Neoplasm of breast, primary tumor staging category tis: ductal carcinoma in situ (DCIS), left - Ambulatory Referral to Radiation Oncology  Discussed breast conserving surgery with mastectomy and reconstruction. She is opted for left breast seed localized lumpectomy. She will see Dr. Delane Myers of her of oncology for her CLL and I sent a note. Refer to radiation oncology for consideration of radiation therapy. We compared and contrasted lumpectomy as well as mastectomy reconstruction. We reviewed local regional recurrence rates, survival and complications as well as long-term expectations of each. She has agreed to proceed with left breast seed localized lumpectomy.  Past/Anticipated interventions by medical oncology, if any:  Dr. Marin Myers 03-27-22 Interim History: [name] is here today for follow-up.  She is under some stress right now.  The big news is that she apparently has breast cancer now.   She had a biopsy on 03/10/2022.  The  pathology report ([id number]) showed carcinoma in situ.  I think measured she 0.1 cm.   She is going to have surgery for this.  She is to see radiation oncology.   Told her to stop the venetoclax right now.  Impression and Plan: [name] is a very pleasant 82 yo caucasian female with CLL.  So far, we have not had any negative prognostic markers.  She does have the 13q-chromosomal abnormality.  This is typically construed as a good prognostic marker.   As far as the breast cancer is a current concern, I suspect this probably can be DCIS.  I really do not think this should be a problem for [place] in the future.   I do not see a problem with her having a lumpectomy or even having radiation therapy afterwards.  I am not sure she would even need radiation given her age.   Again, I told her to stop the venetoclax about a week or so before surgery.  She is not sure when surgery will be.  I told her to restart the venetoclax about 7 days after surgery.   I forgot to mention that she did respond well to IV iron.  She got IV iron back in December.   I will like to see her back probably in about 6 weeks or so.  We will then have to talk about the DCIS treatment from our point of view.    Dr. 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  She is doing quite nicely.  We did do a CT scan on her.  This was done on 02/17/2022.  There is no evidence of lymphadenopathy.  Everything looked pretty much in remission for the CLL.   For right now, I think that we can probably stop the Calquence and just  keep her on the venetoclax.  She has been on the Alfalfa for a year.   She does feel tired.  We have given her IV iron in the past.  She did get a dose of Feraheme in December 2023.   She does feel tired.  Some may be, stopping the Calquence will help this.  Impression and Plan: [name] is a very pleasant 82 yo caucasian female with CLL.  So far, we have not had any negative prognostic markers.  She does have the 13q-chromosomal abnormality.  This is typically construed as a good prognostic marker.   Again, we are going to hold the acalabrutinib.  We will just continue on the venetoclax.   Her iron studies show ferritin of 109 with an iron saturation of 21%.  I really do not think we have to give her any IV iron right now.   She does have a low erythropoietin level.  We can always give her ESA if necessary.   I am is very happy that she has done so nicely.  Again we will have her on venetoclax.   I would like to see her back in about 6 weeks.   Will be interesting to see if her fatigue improves off the acalabrutinib.    Lymphedema issues, if any:  {:18581} {t:21944}   Pain issues, if any:  {:18581} {PAIN DESCRIPTION:21022940}  SAFETY ISSUES: Prior radiation? {:18581} Pacemaker/ICD? {:18581} Possible current pregnancy?{:18581} Is the patient on methotrexate? {:18581}  Current Complaints / other details:  ***

## 2022-03-27 ENCOUNTER — Inpatient Hospital Stay (HOSPITAL_BASED_OUTPATIENT_CLINIC_OR_DEPARTMENT_OTHER): Payer: Medicare Other | Admitting: Hematology & Oncology

## 2022-03-27 ENCOUNTER — Encounter: Payer: Self-pay | Admitting: Hematology & Oncology

## 2022-03-27 ENCOUNTER — Other Ambulatory Visit: Payer: Self-pay

## 2022-03-27 ENCOUNTER — Inpatient Hospital Stay: Payer: Medicare Other | Attending: Hematology & Oncology

## 2022-03-27 VITALS — BP 160/58 | HR 59 | Temp 97.7°F | Resp 18 | Ht 66.0 in | Wt 170.0 lb

## 2022-03-27 DIAGNOSIS — C9110 Chronic lymphocytic leukemia of B-cell type not having achieved remission: Secondary | ICD-10-CM

## 2022-03-27 DIAGNOSIS — Z85038 Personal history of other malignant neoplasm of large intestine: Secondary | ICD-10-CM | POA: Diagnosis not present

## 2022-03-27 DIAGNOSIS — D509 Iron deficiency anemia, unspecified: Secondary | ICD-10-CM | POA: Insufficient documentation

## 2022-03-27 DIAGNOSIS — D0590 Unspecified type of carcinoma in situ of unspecified breast: Secondary | ICD-10-CM | POA: Insufficient documentation

## 2022-03-27 DIAGNOSIS — C179 Malignant neoplasm of small intestine, unspecified: Secondary | ICD-10-CM

## 2022-03-27 LAB — CBC WITH DIFFERENTIAL (CANCER CENTER ONLY)
Abs Immature Granulocytes: 0.02 10*3/uL (ref 0.00–0.07)
Basophils Absolute: 0 10*3/uL (ref 0.0–0.1)
Basophils Relative: 0 %
Eosinophils Absolute: 0 10*3/uL (ref 0.0–0.5)
Eosinophils Relative: 0 %
HCT: 33.6 % — ABNORMAL LOW (ref 36.0–46.0)
Hemoglobin: 11.2 g/dL — ABNORMAL LOW (ref 12.0–15.0)
Immature Granulocytes: 1 %
Lymphocytes Relative: 45 %
Lymphs Abs: 1.3 10*3/uL (ref 0.7–4.0)
MCH: 32.9 pg (ref 26.0–34.0)
MCHC: 33.3 g/dL (ref 30.0–36.0)
MCV: 98.8 fL (ref 80.0–100.0)
Monocytes Absolute: 0.8 10*3/uL (ref 0.1–1.0)
Monocytes Relative: 26 %
Neutro Abs: 0.8 10*3/uL — ABNORMAL LOW (ref 1.7–7.7)
Neutrophils Relative %: 28 %
Platelet Count: 150 10*3/uL (ref 150–400)
RBC: 3.4 MIL/uL — ABNORMAL LOW (ref 3.87–5.11)
RDW: 12.8 % (ref 11.5–15.5)
Smear Review: NORMAL
WBC Count: 2.9 10*3/uL — ABNORMAL LOW (ref 4.0–10.5)
nRBC: 0 % (ref 0.0–0.2)

## 2022-03-27 LAB — CMP (CANCER CENTER ONLY)
ALT: 14 U/L (ref 0–44)
AST: 20 U/L (ref 15–41)
Albumin: 4.1 g/dL (ref 3.5–5.0)
Alkaline Phosphatase: 71 U/L (ref 38–126)
Anion gap: 9 (ref 5–15)
BUN: 22 mg/dL (ref 8–23)
CO2: 26 mmol/L (ref 22–32)
Calcium: 9.1 mg/dL (ref 8.9–10.3)
Chloride: 108 mmol/L (ref 98–111)
Creatinine: 1.23 mg/dL — ABNORMAL HIGH (ref 0.44–1.00)
GFR, Estimated: 44 mL/min — ABNORMAL LOW (ref >60)
Glucose, Bld: 118 mg/dL — ABNORMAL HIGH (ref 70–99)
Potassium: 4 mmol/L (ref 3.5–5.1)
Sodium: 143 mmol/L (ref 135–145)
Total Bilirubin: 0.5 mg/dL (ref 0.3–1.2)
Total Protein: 6.5 g/dL (ref 6.5–8.1)

## 2022-03-27 LAB — IRON AND IRON BINDING CAPACITY (CC-WL,HP ONLY)
Iron: 86 ug/dL (ref 28–170)
Saturation Ratios: 27 % (ref 10.4–31.8)
TIBC: 314 ug/dL (ref 250–450)
UIBC: 228 ug/dL (ref 148–442)

## 2022-03-27 LAB — SAVE SMEAR(SSMR), FOR PROVIDER SLIDE REVIEW

## 2022-03-27 LAB — LACTATE DEHYDROGENASE: LDH: 222 U/L — ABNORMAL HIGH (ref 98–192)

## 2022-03-27 LAB — FERRITIN: Ferritin: 127 ng/mL (ref 11–307)

## 2022-03-27 NOTE — Progress Notes (Signed)
Hematology and Oncology Follow Up Visit  [place] [id number] 04/13/1940 82 y.o. 03/27/2022   Principle Diagnosis:  CLL -stage A -- progressive  -- 13q-  Remote history of colon cancer Iron deficiency anemia  Current Therapy:  Rituxan/Acalabrutinib -- s/p cycle 6-- start on 09/15/2020  Venetoclax 50 mg po q day x 7 days, then 100 mg po q day Calquence/Venetoclax -- start on 04/21/2021 --Calquence DC'd on 02/20/2022 IV iron as indicated-Feraheme given on 12/22/2021    Interim History: [name] is here today for follow-up.  She is under some stress right now.  The big news is that she apparently has breast cancer now.  She had a biopsy on 03/10/2022.  The pathology report [id number]) showed carcinoma in situ.  I think measured she 0.1 cm.  She is going to have surgery for this.  She is to see radiation oncology.  Told her to stop the venetoclax right now.  The second problem is that her son has metastatic lung cancer.  He lives in [place].  She would like to go see him.  I told her that I have no problems with her doing that.  As far as the CLL is concerned, I think she is doing pretty good with this.  She will has had no complaints with the venetoclax.  She has had no nausea or vomiting.  She has had no bleeding.  There is been no change in bowel or bladder habits.  She has had no rashes.  Overall, I would say that her performance status is ECOG 1.  Medications:  Allergies as of 03/27/2022       Reactions   Codeine Other (See Comments)   REACTION: makes her nervous, orTylenol #3   Ibuprofen Other (See Comments)   REACTION: nervous   Meperidine Hcl Nausea And Vomiting   Meperidine Hcl Nausea And Vomiting   Naproxen Sodium Other (See Comments)   REACTION: nervous   Naproxen Sodium Other (See Comments)   Or Advil REACTION: nervous   Tramadol Other (See Comments)   Insomnia         Medication List        Accurate as of March 27, 2022 11:35 AM. If you have any  questions, ask your nurse or doctor.          STOP taking these medications    azelastine 0.1 % nasal spray Commonly known as: ASTELIN Stopped by: Sara Napoleon, MD   HYDROcodone-acetaminophen 5-325 MG tablet Commonly known as: NORCO/VICODIN Stopped by: Sara Napoleon, MD   nitroGLYCERIN 0.3 MG SL tablet Commonly known as: Nitrostat Stopped by: Sara Napoleon, MD   ONE TOUCH ULTRA SYSTEM KIT w/Device Kit Stopped by: Sara Napoleon, MD       TAKE these medications    acetaminophen 500 MG tablet Commonly known as: TYLENOL Take 1 tablet (500 mg total) by mouth every 6 (six) hours as needed.   amLODipine 5 MG tablet Commonly known as: NORVASC TAKE 1 TABLET(5 MG) BY MOUTH DAILY   aspirin 81 MG chewable tablet Chew 1 tablet (81 mg total) by mouth daily.   atenolol 100 MG tablet Commonly known as: TENORMIN TAKE 1 TABLET(100 MG) BY MOUTH DAILY   atorvastatin 20 MG tablet Commonly known as: LIPITOR Take 1 tablet (20 mg total) by mouth daily.   cholecalciferol 1000 units tablet Commonly known as: VITAMIN D Take 1,000 Units by mouth daily.   clopidogrel 75 MG tablet Commonly known as: PLAVIX Take  1 tablet (75 mg total) by mouth daily.   fluticasone 50 MCG/ACT nasal spray Commonly known as: FLONASE SHAKE LIQUID AND USE 2 SPRAYS IN EACH NOSTRIL DAILY   insulin NPH Human 100 UNIT/ML injection Commonly known as: NOVOLIN N 02/20/2022 Inject 36 units into the skin every morning and inject 20 units into the skin at bedtime.   insulin regular 100 units/mL injection Commonly known as: NOVOLIN R Inject 10 Units into the skin 2 (two) times daily before a meal.   INSULIN SYRINGE .5CC/31GX5/16" 31G X 5/16" 0.5 ML Misc USE TO INJECT INSULIN 5 TIMES PER DAY   irbesartan 300 MG tablet Commonly known as: AVAPRO TAKE 1/2 TABLET(150 MG) BY MOUTH DAILY   isosorbide mononitrate 30 MG 24 hr tablet Commonly known as: IMDUR Take 1 tablet (30 mg total) by mouth daily.    metFORMIN 500 MG tablet Commonly known as: GLUCOPHAGE TAKE 1 TABLET(500 MG) BY MOUTH DAILY WITH SUPPER   mometasone 0.1 % cream Commonly known as: ELOCON Apply 1 application topically daily. Use as directed   multivitamin capsule Take 1 capsule by mouth daily.   omeprazole 20 MG capsule Commonly known as: PRILOSEC TAKE 1 CAPSULE(20 MG) BY MOUTH DAILY   ondansetron 8 MG tablet Commonly known as: ZOFRAN Take 1 tablet (8 mg total) by mouth every 8 (eight) hours as needed for nausea or vomiting.   OneTouch Ultra test strip Generic drug: glucose blood USE AS DIRECTED THREE TIMES DAILY   Semaglutide(0.25 or 0.5MG /DOS) 2 MG/3ML Sopn Inject 0.25mg  weekly for two weeks then 0.5mg  weekly What changed: additional instructions   Venclexta 100 MG tablet Generic drug: venetoclax Take 1 tablet (100 mg total) by mouth daily. Tablets should be swallowed whole with a meal and a full glass of water.        Allergies:  Allergies  Allergen Reactions   Codeine Other (See Comments)    REACTION: makes her nervous, orTylenol #3   Ibuprofen Other (See Comments)    REACTION: nervous   Meperidine Hcl Nausea And Vomiting   Meperidine Hcl Nausea And Vomiting   Naproxen Sodium Other (See Comments)    REACTION: nervous   Naproxen Sodium Other (See Comments)    Or Advil  REACTION: nervous   Tramadol Other (See Comments)    Insomnia     Past Medical History, Surgical history, Social history, and Family History were reviewed and updated.  Review of Systems: Review of Systems  Constitutional: Negative.   HENT: Negative.    Eyes: Negative.   Respiratory: Negative.    Cardiovascular: Negative.   Gastrointestinal: Negative.   Genitourinary: Negative.   Musculoskeletal: Negative.   Skin: Negative.   Neurological: Negative.   Endo/Heme/Allergies: Negative.   Psychiatric/Behavioral: Negative.       Physical Exam:  vitals were not taken for this visit.   Wt Readings from Last 3  Encounters:  02/20/22 168 lb 12.8 oz (76.6 kg)  02/15/22 172 lb (78 kg)  01/13/22 168 lb (76.2 kg)    Physical Exam Vitals reviewed.  HENT:     Head: Normocephalic and atraumatic.  Eyes:     Pupils: Pupils are equal, round, and reactive to light.  Cardiovascular:     Rate and Rhythm: Normal rate and regular rhythm.     Heart sounds: Normal heart sounds.  Pulmonary:     Effort: Pulmonary effort is normal.     Breath sounds: Normal breath sounds.  Abdominal:     General: Bowel sounds are normal.  Palpations: Abdomen is soft.  Musculoskeletal:        General: No tenderness or deformity. Normal range of motion.     Cervical back: Normal range of motion.  Lymphadenopathy:     Cervical: No cervical adenopathy.  Skin:    General: Skin is warm and dry.     Findings: No erythema or rash.  Neurological:     Mental Status: She is alert and oriented to person, place, and time.  Psychiatric:        Behavior: Behavior normal.        Thought Content: Thought content normal.        Judgment: Judgment normal.     Lab Results  Component Value Date   WBC 2.9 (L) 03/27/2022   HGB 11.2 (L) 03/27/2022   HCT 33.6 (L) 03/27/2022   MCV 98.8 03/27/2022   PLT 150 03/27/2022   Lab Results  Component Value Date   FERRITIN 109 02/20/2022   IRON 63 02/20/2022   TIBC 304 02/20/2022   UIBC 241 02/20/2022   IRONPCTSAT 21 02/20/2022   Lab Results  Component Value Date   RETICCTPCT 1.9 08/24/2020   RBC 3.40 (L) 03/27/2022   Lab Results  Component Value Date   KPAFRELGTCHN 32.8 (H) 05/16/2021   LAMBDASER 26.4 (H) 05/16/2021   KAPLAMBRATIO 1.24 05/16/2021   Lab Results  Component Value Date   IGGSERUM 900 02/20/2022   IGA 181 02/20/2022   IGMSERUM 19 (L) 02/20/2022   Lab Results  Component Value Date   TOTALPROTELP 6.5 11/18/2020   ALBUMINELP 3.6 11/18/2020   A1GS 0.3 11/18/2020   A2GS 0.9 11/18/2020   BETS 1.0 11/18/2020   BETA2SER 6.2 02/25/2014   GAMS 0.7 11/18/2020    MSPIKE Not Observed 11/18/2020   SPEI * 02/25/2014     Chemistry      Component Value Date/Time   NA 143 03/27/2022 1020   NA 147 (H) 11/03/2016 1018   NA 143 08/30/2016 0954   K 4.0 03/27/2022 1020   K 3.9 11/03/2016 1018   K 4.5 08/30/2016 0954   CL 108 03/27/2022 1020   CL 110 (H) 11/03/2016 1018   CO2 26 03/27/2022 1020   CO2 27 11/03/2016 1018   CO2 24 08/30/2016 0954   BUN 22 03/27/2022 1020   BUN 25 (H) 11/03/2016 1018   BUN 20.5 08/30/2016 0954   CREATININE 1.23 (H) 03/27/2022 1020   CREATININE 1.6 (H) 11/03/2016 1018   CREATININE 1.4 (H) 08/30/2016 0954      Component Value Date/Time   CALCIUM 9.1 03/27/2022 1020   CALCIUM 10.1 11/03/2016 1018   CALCIUM 9.8 08/30/2016 0954   ALKPHOS 71 03/27/2022 1020   ALKPHOS 112 (H) 11/03/2016 1018   ALKPHOS 113 08/30/2016 0954   AST 20 03/27/2022 1020   AST 23 08/30/2016 0954   ALT 14 03/27/2022 1020   ALT 29 11/03/2016 1018   ALT 20 08/30/2016 0954   BILITOT 0.5 03/27/2022 1020   BILITOT 0.38 08/30/2016 0954      Impression and Plan: Ms. Swarthout is a very pleasant 82 yo caucasian female with CLL.  So far, we have not had any negative prognostic markers.  She does have the 13q-chromosomal abnormality.  This is typically construed as a good prognostic marker.  As far as the breast cancer is a current concern, I suspect this probably can be DCIS.  I really do not think this should be a problem for [place] in the future.  I  do not see a problem with her having a lumpectomy or even having radiation therapy afterwards.  I am not sure she would even need radiation given her age.  Again, I told her to stop the venetoclax about a week or so before surgery.  She is not sure when surgery will be.  I told her to restart the venetoclax about 7 days after surgery.  I forgot to mention that she did respond well to IV iron.  She got IV iron back in December.  I will like to see her back probably in about 6 weeks or so.  We will then have  to talk about the DCIS treatment from our point of view.    Sara Napoleon, MD 3/25/202411:35 AM

## 2022-03-28 ENCOUNTER — Other Ambulatory Visit: Payer: Self-pay | Admitting: Cardiovascular Disease

## 2022-03-28 LAB — SURGICAL PATHOLOGY

## 2022-03-28 LAB — ERYTHROPOIETIN: Erythropoietin: 18.3 m[IU]/mL (ref 2.6–18.5)

## 2022-03-29 LAB — FLOW CYTOMETRY
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
## 2022-03-30 ENCOUNTER — Ambulatory Visit
Admission: RE | Admit: 2022-03-30 | Discharge: 2022-03-30 | Disposition: A | Discharge status: Home / Self Care | Payer: Medicare Other | Source: Ambulatory Visit | Attending: Radiation Oncology | Admitting: Radiation Oncology

## 2022-03-30 ENCOUNTER — Other Ambulatory Visit: Payer: Self-pay

## 2022-03-30 ENCOUNTER — Ambulatory Visit
Admission: RE | Admit: 2022-03-30 | Discharge: 2022-03-30 | Disposition: A | Discharge status: Home / Self Care | Payer: No Typology Code available for payment source | Source: Ambulatory Visit | Attending: Radiation Oncology | Admitting: Radiation Oncology

## 2022-03-30 VITALS — BP 182/79 | HR 69 | Temp 97.0°F | Resp 18 | Ht 66.0 in | Wt 172.5 lb

## 2022-03-30 DIAGNOSIS — Z85828 Personal history of other malignant neoplasm of skin: Secondary | ICD-10-CM | POA: Insufficient documentation

## 2022-03-30 DIAGNOSIS — M129 Arthropathy, unspecified: Secondary | ICD-10-CM | POA: Insufficient documentation

## 2022-03-30 DIAGNOSIS — E119 Type 2 diabetes mellitus without complications: Secondary | ICD-10-CM | POA: Diagnosis not present

## 2022-03-30 DIAGNOSIS — Z79899 Other long term (current) drug therapy: Secondary | ICD-10-CM | POA: Insufficient documentation

## 2022-03-30 DIAGNOSIS — K219 Gastro-esophageal reflux disease without esophagitis: Secondary | ICD-10-CM | POA: Diagnosis not present

## 2022-03-30 DIAGNOSIS — Z170 Estrogen receptor positive status [ER+]: Secondary | ICD-10-CM | POA: Insufficient documentation

## 2022-03-30 DIAGNOSIS — Z7984 Long term (current) use of oral hypoglycemic drugs: Secondary | ICD-10-CM | POA: Diagnosis not present

## 2022-03-30 DIAGNOSIS — E538 Deficiency of other specified B group vitamins: Secondary | ICD-10-CM | POA: Insufficient documentation

## 2022-03-30 DIAGNOSIS — D0512 Intraductal carcinoma in situ of left breast: Secondary | ICD-10-CM | POA: Diagnosis not present

## 2022-03-30 DIAGNOSIS — Z85038 Personal history of other malignant neoplasm of large intestine: Secondary | ICD-10-CM | POA: Insufficient documentation

## 2022-03-30 DIAGNOSIS — C9111 Chronic lymphocytic leukemia of B-cell type in remission: Secondary | ICD-10-CM | POA: Insufficient documentation

## 2022-03-30 DIAGNOSIS — I10 Essential (primary) hypertension: Secondary | ICD-10-CM | POA: Insufficient documentation

## 2022-03-30 DIAGNOSIS — Z794 Long term (current) use of insulin: Secondary | ICD-10-CM | POA: Insufficient documentation

## 2022-03-30 DIAGNOSIS — E785 Hyperlipidemia, unspecified: Secondary | ICD-10-CM | POA: Diagnosis not present

## 2022-03-30 DIAGNOSIS — Z7982 Long term (current) use of aspirin: Secondary | ICD-10-CM | POA: Insufficient documentation

## 2022-03-30 DIAGNOSIS — I2510 Atherosclerotic heart disease of native coronary artery without angina pectoris: Secondary | ICD-10-CM | POA: Diagnosis not present

## 2022-03-30 DIAGNOSIS — Z86010 Personal history of colonic polyps: Secondary | ICD-10-CM | POA: Diagnosis not present

## 2022-04-03 HISTORY — PX: BREAST LUMPECTOMY: SHX2

## 2022-04-11 ENCOUNTER — Telehealth: Payer: Self-pay | Admitting: *Deleted

## 2022-04-11 ENCOUNTER — Other Ambulatory Visit (HOSPITAL_COMMUNITY): Payer: Self-pay

## 2022-04-11 NOTE — Telephone Encounter (Signed)
   Pre-operative Risk Assessment    Patient Name: [name]  DOB: 04/04/1940 MRN: [id number]      Request for Surgical Clearance    Procedure:   LUMPECTOMY  ;  Stage 0 (cTis (DCIS), cN0, cM0) Left Breast, Intermediate grade DCIS, ER+ / PR+ / Her2 not assessed   Date of Surgery:  Clearance TBD                                 Surgeon:  DR. Harriette Bouillon Surgeon's Group or Practice Name:  [facility] Phone number:  [phone] Fax number:  [phone] ATTN: Brennan Bailey, CMA   Type of Clearance Requested:   - Medical  - Pharmacy:  Hold Clopidogrel (Plavix)     Type of Anesthesia:  General    Additional requests/questions:    Elpidio Anis   04/11/2022, 5:06 PM

## 2022-04-12 ENCOUNTER — Telehealth: Payer: Self-pay | Admitting: *Deleted

## 2022-04-12 ENCOUNTER — Telehealth: Payer: Self-pay | Admitting: Cardiovascular Disease

## 2022-04-12 NOTE — Telephone Encounter (Signed)
   Name: [name]  DOB: [birth year]  MRN: [id number]  Primary Cardiologist: Nanetta Batty, MD   Preoperative team, please contact this patient and set up a phone call appointment for further preoperative risk assessment. Please obtain consent and complete medication review. Thank you for your help.  I confirm that guidance regarding antiplatelet and oral anticoagulation therapy has been completed and, if necessary, noted below.  Per office protocol, he may hold Plavix/aspirin for 5-7 days prior to procedure and should resume as soon as hemodynamically stable postoperatively.    Carlos Levering, NP 04/12/2022, 10:28 AM [facility]

## 2022-04-12 NOTE — Telephone Encounter (Signed)
I s/w the pt and she has been added on tomorrow @ 1:40. Pt is anxious and needs to have this surgery for breast cancer. Pt states she has a son in [place] who also has cancer and is very sick. Med rec and consent are done. Pt thanked me over and over again for squeezing her in.

## 2022-04-12 NOTE — Telephone Encounter (Signed)
Pt returning call regarding Clearance. Please advise 

## 2022-04-12 NOTE — Telephone Encounter (Signed)
Left message for the pt to call back and schedule a tele pre op appt.  

## 2022-04-12 NOTE — Telephone Encounter (Signed)
I s/w the pt and she has been added on tomorrow @ 1:40. Pt is anxious and needs to have this surgery for breast cancer. Pt states she has a son in [place] who also has cancer and is very sick. Med rec and consent are done. Pt thanked me over and over again for squeezing her in.      Patient Consent for Virtual Visit        [name] has provided verbal consent on 04/12/2022 for a virtual visit (video or telephone).   CONSENT FOR VIRTUAL VISIT FOR:  [name]  By participating in this virtual visit I agree to the following:  I hereby voluntarily request, consent and authorize [facility] and its employed or contracted physicians, physician assistants, nurse practitioners or other licensed health care professionals (the Practitioner), to provide me with telemedicine health care services (the "Services") as deemed necessary by the treating Practitioner. I acknowledge and consent to receive the Services by the Practitioner via telemedicine. I understand that the telemedicine visit will involve communicating with the Practitioner through live audiovisual communication technology and the disclosure of certain medical information by electronic transmission. I acknowledge that I have been given the opportunity to request an in-person assessment or other available alternative prior to the telemedicine visit and am voluntarily participating in the telemedicine visit.  I understand that I have the right to withhold or withdraw my consent to the use of telemedicine in the course of my care at any time, without affecting my right to future care or treatment, and that the Practitioner or I may terminate the telemedicine visit at any time. I understand that I have the right to inspect all information obtained and/or recorded in the course of the telemedicine visit and may receive copies of available information for a reasonable fee.  I understand that some of the potential risks of  receiving the Services via telemedicine include:  Delay or interruption in medical evaluation due to technological equipment failure or disruption; Information transmitted may not be sufficient (e.g. poor resolution of images) to allow for appropriate medical decision making by the Practitioner; and/or  In rare instances, security protocols could fail, causing a breach of personal health information.  Furthermore, I acknowledge that it is my responsibility to provide information about my medical history, conditions and care that is complete and accurate to the best of my ability. I acknowledge that Practitioner's advice, recommendations, and/or decision may be based on factors not within their control, such as incomplete or inaccurate data provided by me or distortions of diagnostic images or specimens that may result from electronic transmissions. I understand that the practice of medicine is not an exact science and that Practitioner makes no warranties or guarantees regarding treatment outcomes. I acknowledge that a copy of this consent can be made available to me via my patient portal [facility] MyChart), or I can request a printed copy by calling the office of [facility].    I understand that my insurance will be billed for this visit.   I have read or had this consent read to me. I understand the contents of this consent, which adequately explains the benefits and risks of the Services being provided via telemedicine.  I have been provided ample opportunity to ask questions regarding this consent and the Services and have had my questions answered to my satisfaction. I give my informed consent for the services to be provided through the use of telemedicine in my  medical care

## 2022-04-13 ENCOUNTER — Ambulatory Visit: Payer: Medicare Other | Attending: Cardiology | Admitting: Student

## 2022-04-13 ENCOUNTER — Other Ambulatory Visit: Payer: Self-pay | Admitting: Surgery

## 2022-04-13 DIAGNOSIS — Z01810 Encounter for preprocedural cardiovascular examination: Secondary | ICD-10-CM | POA: Diagnosis not present

## 2022-04-13 DIAGNOSIS — D0512 Intraductal carcinoma in situ of left breast: Secondary | ICD-10-CM

## 2022-04-13 NOTE — Progress Notes (Signed)
Virtual Visit via Telephone Note   Because of [name]'s co-morbid illnesses, she is at least at moderate risk for complications without adequate follow up.  This format is felt to be most appropriate for this patient at this time.  The patient did not have access to video technology/had technical difficulties with video requiring transitioning to audio format only (telephone).  All issues noted in this document were discussed and addressed.  No physical exam could be performed with this format.  Please refer to the patient's chart for her consent to telehealth for [facility].  Evaluation Performed:  Preoperative cardiovascular risk assessment _____________   Date:  04/13/2022   Patient ID:  [name], DOB 06/19/1940, MRN [id number] Patient Location:  Home Provider location:   Office  Primary Care Provider:  Wanda PlumpPaz, Jose E, MD Primary Cardiologist:  Nanetta BattyJonathan Berry, MD  Chief Complaint / Patient Profile   82 y.o. y/o female with a h/o CAD s/p CABG x 5 1997 and low risk stress test May 2021, CLL, hypertension, hyperlipidemia, T2DM who is pending lumpectomy by Dr. Luisa Hartornett and presents today for telephonic preoperative cardiovascular risk assessment.  History of Present Illness    [name] is a 82 y.o. female who presents via Web designeraudio/video conferencing for a telehealth visit today.  Pt was last seen in cardiology clinic on 01/11/2022 by Dr. Allyson SabalBerry.  At that time [name] was doing well from a cardiac standpoint.  The patient is now pending procedure as outlined above. Since her last visit, she continues to do well. Patient denies shortness of breath or dyspnea on exertion. No chest pain, pressure, or tightness. Denies lower extremity edema, orthopnea, or PND. No palpitations. She has some mild fatigue ever since breaking her hip three years ago but this is unchanged. She stays active by performing all light to heavy household tasks and yard work  including raking.    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   Gastritis    GERD (gastroesophageal reflux disease)    Headache(784.0)    History of blood transfusion    "I"ve had 20 some; thru birth of children, loss of blood, last 2 were in ~ 1999 before my cancer surgery" (09/16/2015)   History of hiatal hernia    History of shingles    Hx of colonic polyps    Hyperlipidemia    Hypertension    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HCC) 2000   Myocardial infarction (HCC)    1997   Pneumonia    X 1   Postoperative hematoma involving circulatory system following cardiac catheterization 09/17/2015   S/P cardiac cath 09/16/15 09/17/2015   Type II diabetes mellitus (HCC)    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[facility](HCC)    as a child   Venous insufficiency    Past Surgical History:  Procedure Laterality Date  BREAST BIOPSY Left 03/10/2022   MM LT BREAST BX W LOC DEV 1ST LESION IMAGE BX SPEC STEREO GUIDE 03/10/2022 [facility] MAMMOGRAPHY   CARDIAC CATHETERIZATION  1997; 2008; 09/16/2015   CARDIAC CATHETERIZATION N/A 09/16/2015   Procedure: Right/Left Heart Cath and Coronary/Graft Angiography;  Surgeon: Runell Gess, MD;  Location: [facility] INVASIVE CV LAB;  Service: Cardiovascular;  Laterality: N/A;   CESAREAN SECTION  1966   COLONOSCOPY     CORONARY ARTERY BYPASS GRAFT  1997   CABG X5   EYE SURGERY     2 laser surgeries on left eye with implant   EYE SURGERY Bilateral    cataracts   FEMUR IM NAIL  Left 08/28/2018   Procedure: INTRAMEDULLARY (IM) NAIL FEMORAL;  Surgeon: Samson Frederic, MD;  Location: [facility] ORS;  Service: Orthopedics;  Laterality: Left;   FRACTURE SURGERY     HERNIA REPAIR     LAPAROSCOPIC ASSISTED VENTRAL HERNIA REPAIR  09/2008    with incarcerated colon;  Dr. 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 Samuella Cota   SHOULDER ARTHROSCOPY W/ ROTATOR CUFF REPAIR Right 1997   SMALL INTESTINE SURGERY     TONSILLECTOMY  1960s   TOTAL KNEE ARTHROPLASTY WITH HARDWARE REMOVAL Left 12/1994    (infex, hardware removed )   TUBAL LIGATION  1966    Allergies  Allergies  Allergen Reactions   Codeine Other (See Comments)    REACTION: makes her nervous, orTylenol #3   Ibuprofen Other (See Comments)    REACTION: nervous   Meperidine Hcl Nausea And Vomiting   Meperidine Hcl Nausea And Vomiting   Naproxen Sodium Other (See Comments)    REACTION: nervous   Naproxen Sodium Other (See Comments)    Or Advil  REACTION: nervous   Tramadol Other (See Comments)    Insomnia     Home Medications    Prior to Admission medications   Medication Sig Start Date End Date Taking? Authorizing Provider  acetaminophen (TYLENOL) 500 MG tablet Take 1 tablet (500 mg total) by mouth every 6 (six) hours as needed. 12/25/21   Redwine, Madison A, PA-C  amLODipine (NORVASC) 5 MG tablet TAKE 1 TABLET(5 MG) BY MOUTH DAILY 12/05/21   Reather Littler, MD  aspirin 81 MG chewable tablet Chew 1 tablet (81 mg total) by mouth daily. 06/22/21   Wanda Plump, MD  atenolol (TENORMIN) 100 MG tablet TAKE 1 TABLET(100 MG) BY MOUTH DAILY 02/24/22   Wanda Plump, MD  atorvastatin (LIPITOR) 20 MG tablet TAKE 1 TABLET(20 MG) BY MOUTH DAILY 03/28/22   Runell Gess, MD  cholecalciferol (VITAMIN D) 1000 UNITS tablet Take 1,000 Units by mouth  daily.    [provider]  clopidogrel (PLAVIX) 75 MG tablet Take 1 tablet (75 mg total) by mouth daily. 01/30/22   Wanda Plump, MD  fluticasone [facility]) 50 MCG/ACT nasal spray SHAKE LIQUID AND USE 2 SPRAYS IN [facility] NOSTRIL DAILY Patient not taking: Reported on 02/20/2022 09/19/17   Wanda Plump, MD  glucose blood [facility] ULTRA) test strip USE AS DIRECTED THREE TIMES DAILY Patient not taking: Reported on 02/20/2022 06/03/21   Reather Littler, MD  insulin NPH Human (HUMULIN N,NOVOLIN N) 100 UNIT/ML injection 02/20/2022 Inject 36 units into the  skin every morning and inject 20 units into the skin at bedtime.    [provider]  insulin regular (NOVOLIN R,HUMULIN R) 100 units/mL injection Inject 10 Units into the skin 2 (two) times daily before a meal.    [provider]  Insulin Syringe-Needle U-100 (INSULIN SYRINGE .5CC/31GX5/16") 31G X 5/16" 0.5 ML MISC USE TO INJECT INSULIN 5 TIMES PER DAY 08/28/19   Reather Littler, MD  irbesartan (AVAPRO) 300 MG tablet TAKE 1/2 TABLET(150 MG) BY MOUTH DAILY 05/09/21   Runell Gess, MD  isosorbide mononitrate (IMDUR) 30 MG 24 hr tablet Take 1 tablet (30 mg total) by mouth daily. 05/05/21   Runell Gess, MD  metFORMIN (GLUCOPHAGE) 500 MG tablet TAKE 1 TABLET(500 MG) BY MOUTH DAILY WITH SUPPER 10/03/21   Reather Littler, MD  mometasone (ELOCON) 0.1 % cream Apply 1 application topically daily. Use as directed 04/18/16   [provider]  Multiple Vitamin (MULTIVITAMIN) capsule Take 1 capsule by mouth daily.    [provider]  omeprazole (PRILOSEC) 20 MG capsule TAKE 1 CAPSULE(20 MG) BY MOUTH DAILY 12/08/21   Josph Macho, MD  ondansetron (ZOFRAN) 8 MG tablet Take 1 tablet (8 mg total) by mouth every 8 (eight) hours as needed for nausea or vomiting. 12/20/20   Josph Macho, MD  Semaglutide,0.25 or 0.5MG /DOS, 2 MG/3ML SOPN Inject 0.25mg  weekly for two weeks then 0.5mg  weekly Patient taking differently: Inject 0.25mg  weekly for two  weeks then 0.5mg  weekly 02/20/2022 States has been taking daily x 4 days by mistake. 01/25/22   Reather Littler, MD  venetoclax (VENCLEXTA) 100 MG tablet Take 1 tablet (100 mg total) by mouth daily. Tablets should be swallowed whole with a meal and a full glass of water. 03/15/22   Josph Macho, MD    Physical Exam    Vital Signs:  [name] does not have vital signs available for review today.  Given telephonic nature of communication, physical exam is limited. AAOx3. NAD. Normal affect.  Speech and respirations are unlabored.  Accessory Clinical Findings    None  Assessment & Plan    Primary Cardiologist: Nanetta Batty, MD  Preoperative cardiovascular risk assessment.  Lumpectomy left breast by Dr. Luisa Hart.  Chart reviewed as part of pre-operative protocol coverage. According to the RCRI, patient has a 6.6% risk of MACE. Patient reports activity equivalent to >4.0 METS (independent with all ADLs, light to heavy household tasks, and yard work including raking).   Given past medical history and time since last visit, based on ACC/AHA guidelines, [name] would be at acceptable risk for the planned procedure without further cardiovascular testing.   Patient was advised that if she develops new symptoms prior to surgery to contact our office to arrange a follow-up appointment.  she verbalized understanding.  Per office protocol, he may hold Plavix for 5 days prior to procedure and should resume as soon as hemodynamically stable postoperatively.   Ideally aspirin should be continued without interruption, however if the bleeding risk is too great, aspirin may be held for 5-7 days prior to surgery. Please resume aspirin post operatively when it is felt to be safe from a bleeding standpoint.    I will route this recommendation to the requesting party via Epic fax function.  Please call with questions.  Time:   Today, I have spent 10 minutes with the patient with  telehealth technology discussing medical history, symptoms, and management plan.     Carlos Levering,  NP  04/13/2022, 7:43 AM

## 2022-04-14 ENCOUNTER — Other Ambulatory Visit: Payer: Self-pay

## 2022-04-25 NOTE — Progress Notes (Signed)
Surgical Instructions    Your procedure is scheduled on Tuesday April 30th.  Report to [facility] Main Entrance "A" at 9:30 A.M., then check in with the Admitting office.  Call this number if you have problems the morning of surgery:  [phone]   If you have any questions prior to your surgery date call [phone]: Open Monday-Friday 8am-4pm If you experience any cold or flu symptoms such as cough, fever, chills, shortness of breath, etc. between now and your scheduled surgery, please notify us at the above number     Remember:  Do not eat after midnight the night before your surgery  You may drink clear liquids until 8:30am the morning of your surgery.   Clear liquids allowed are: Water, Non-Citrus Juices (without pulp), Carbonated Beverages, Clear Tea, Black Coffee ONLY (NO MILK, CREAM OR POWDERED CREAMER of any kind), and Gatorade     Take these medicines the morning of surgery with A SIP OF WATER: amLODipine (NORVASC) 5 MG tablet  atenolol (TENORMIN) 100 MG tablet  atorvastatin (LIPITOR) 20 MG tablet  isosorbide mononitrate (IMDUR) 30 MG 24 hr tablet  omeprazole (PRILOSEC) 20 MG capsule   IF NEEDED  acetaminophen (TYLENOL) 500 MG tablet  ondansetron (ZOFRAN) 8 MG tablet    Follow your surgeon's instructions on when to stop Aspirin and Plavix.  If no instructions were given by your surgeon then you will need to call the office to get those instructions.     As of today, STOP taking any Aspirin (unless otherwise instructed by your surgeon) Aleve, Naproxen, Ibuprofen, Motrin, Advil, Goody's, BC's, all herbal medications, fish oil, and all vitamins.  WHAT DO I DO ABOUT MY DIABETES MEDICATION?   Do not take oral diabetes medicines (Metformin) the morning of surgery.  THE DAY BEFORE SUGERY - take your usual dose of your Humulin R insulin - before meals but DO NOT take any bedtime dose.   THE NIGHT BEFORE SURGERY, take _____50% of you Humulin N dose - 12______ units of  __Humulin N insulin.       THE MORNING OF SURGERY, take _50% of you Humulin N dose - 18__ units of __Humulin N________insulin.   The day of surgery, do not take other diabetes injectables, including Byetta (exenatide), Bydureon (exenatide ER), Victoza (liraglutide), or Trulicity (dulaglutide).  If your CBG is greater than 220 mg/dL, you may take  of your sliding scale (correction) dose of insulin. You may use 50% of your Humulin R insulin if your blood sugar is more than 220.    HOW TO MANAGE YOUR DIABETES BEFORE AND AFTER SURGERY  Why is it important to control my blood sugar before and after surgery? Improving blood sugar levels before and after surgery helps healing and can limit problems. A way of improving blood sugar control is eating a healthy diet by:  Eating less sugar and carbohydrates  Increasing activity/exercise  Talking with your doctor about reaching your blood sugar goals High blood sugars (greater than 180 mg/dL) can raise your risk of infections and slow your recovery, so you will need to focus on controlling your diabetes during the weeks before surgery. Make sure that the doctor who takes care of your diabetes knows about your planned surgery including the date and location.  How do I manage my blood sugar before surgery? Check your blood sugar at least 4 times a day, starting 2 days before surgery, to make sure that the level is not too high or low.  Check your blood  sugar the morning of your surgery when you wake up and every 2 hours until you get to the Short Stay unit.  If your blood sugar is less than 70 mg/dL, you will need to treat for low blood sugar: Do not take insulin. Treat a low blood sugar (less than 70 mg/dL) with  cup of clear juice (cranberry or apple), 4 glucose tablets, OR glucose gel. Recheck blood sugar in 15 minutes after treatment (to make sure it is greater than 70 mg/dL). If your blood sugar is not greater than 70 mg/dL on recheck, call  [phone] for further instructions. Report your blood sugar to the short stay nurse when you get to Short Stay.  If you are admitted to the hospital after surgery: Your blood sugar will be checked by the staff and you will probably be given insulin after surgery (instead of oral diabetes medicines) to make sure you have good blood sugar levels. The goal for blood sugar control after surgery is 80-180 mg/dL.  DAY OF SURGERY          Do not wear jewelry or makeup. Do not wear lotions, powders, perfumes or deodorant. Do not shave 48 hours prior to surgery.   Do not bring valuables to the hospital. Do not wear nail polish, gel polish, artificial nails, or any other type of covering on natural nails (fingers and toes) If you have artificial nails or gel coating that need to be removed by a nail salon, please have this removed prior to surgery. Artificial nails or gel coating may interfere with anesthesia's ability to adequately monitor your vital signs.  [facility] is not responsible for any belongings or valuables.    Do NOT Smoke (Tobacco/Vaping)  24 hours prior to your procedure  If you use a CPAP at night, you may bring your mask for your overnight stay.   Contacts, glasses, hearing aids, dentures or partials may not be worn into surgery, please bring cases for these belongings   For patients admitted to the hospital, discharge time will be determined by your treatment team.   Patients discharged the day of surgery will not be allowed to drive home, and someone needs to stay with them for 24 hours.   SURGICAL WAITING ROOM VISITATION Patients having surgery or a procedure may have no more than 2 support people in the waiting area - these visitors may rotate.   Children under the age of 45 must have an adult with them who is not the patient. If the patient needs to stay at the hospital during part of their recovery, the visitor guidelines for inpatient rooms apply. Pre-op nurse will  coordinate an appropriate time for 1 support person to accompany patient in pre-op.  This support person may not rotate.   Please refer to [email or web address] for the visitor guidelines for Inpatients (after your surgery is over and you are in a regular room).    Special instructions:    Oral Hygiene is also important to reduce your risk of infection.  Remember - BRUSH YOUR TEETH THE MORNING OF SURGERY WITH YOUR REGULAR TOOTHPASTE   - Preparing For Surgery  Before surgery, you can play an important role. Because skin is not sterile, your skin needs to be as free of germs as possible. You can reduce the number of germs on your skin by washing with CHG (chlorahexidine gluconate) Soap before surgery.  CHG is an antiseptic cleaner which kills germs and bonds with the skin to continue killing  germs even after washing.     Please do not use if you have an allergy to CHG or antibacterial soaps. If your skin becomes reddened/irritated stop using the CHG.  Do not shave (including legs and underarms) for at least 48 hours prior to first CHG shower. It is OK to shave your face.  Please follow these instructions carefully.     Shower the NIGHT BEFORE SURGERY and the MORNING OF SURGERY with CHG Soap.   If you chose to wash your hair, wash your hair first as usual with your normal shampoo. After you shampoo, rinse your hair and body thoroughly to remove the shampoo.  Then [employer] and genitals (private parts) with your normal soap and rinse thoroughly to remove soap.  After that Use CHG Soap as you would any other liquid soap. You can apply CHG directly to the skin and wash gently with a scrungie or a clean washcloth.   Apply the CHG Soap to your body ONLY FROM THE NECK DOWN.  Do not use on open wounds or open sores. Avoid contact with your eyes, ears, mouth and genitals (private parts). Wash Face and genitals (private parts)  with your normal  soap.   Wash thoroughly, paying special attention to the area where your surgery will be performed.  Thoroughly rinse your body with warm water from the neck down.  DO NOT shower/wash with your normal soap after using and rinsing off the CHG Soap.  Pat yourself dry with a CLEAN TOWEL.  Wear CLEAN PAJAMAS to bed the night before surgery  Place CLEAN SHEETS on your bed the night before your surgery  DO NOT SLEEP WITH PETS.   Day of Surgery:  Take a shower with CHG soap. Wear Clean/Comfortable clothing the morning of surgery Do not apply any deodorants/lotions.   Remember to brush your teeth WITH YOUR REGULAR TOOTHPASTE.    If you received a COVID test during your pre-op visit, it is requested that you wear a mask when out in public, stay away from anyone that may not be feeling well, and notify your surgeon if you develop symptoms. If you have been in contact with anyone that has tested positive in the last 10 days, please notify your surgeon.    Please read over the following fact sheets that you were given.

## 2022-04-26 ENCOUNTER — Encounter (HOSPITAL_COMMUNITY)
Admission: RE | Admit: 2022-04-26 | Discharge: 2022-04-26 | Disposition: A | Discharge status: Home / Self Care | Payer: Medicare Other | Source: Ambulatory Visit | Attending: Surgery | Admitting: Surgery

## 2022-04-26 ENCOUNTER — Telehealth: Payer: Self-pay | Admitting: Endocrinology

## 2022-04-26 ENCOUNTER — Other Ambulatory Visit: Payer: Self-pay

## 2022-04-26 ENCOUNTER — Encounter (HOSPITAL_COMMUNITY): Payer: Self-pay

## 2022-04-26 VITALS — BP 168/82 | HR 65 | Temp 97.8°F | Resp 18 | Ht 66.0 in | Wt 171.5 lb

## 2022-04-26 DIAGNOSIS — Z951 Presence of aortocoronary bypass graft: Secondary | ICD-10-CM | POA: Diagnosis not present

## 2022-04-26 DIAGNOSIS — Z01812 Encounter for preprocedural laboratory examination: Secondary | ICD-10-CM | POA: Insufficient documentation

## 2022-04-26 DIAGNOSIS — E1165 Type 2 diabetes mellitus with hyperglycemia: Secondary | ICD-10-CM

## 2022-04-26 DIAGNOSIS — I10 Essential (primary) hypertension: Secondary | ICD-10-CM | POA: Diagnosis not present

## 2022-04-26 DIAGNOSIS — Z01818 Encounter for other preprocedural examination: Secondary | ICD-10-CM

## 2022-04-26 DIAGNOSIS — Z794 Long term (current) use of insulin: Secondary | ICD-10-CM | POA: Diagnosis not present

## 2022-04-26 DIAGNOSIS — E119 Type 2 diabetes mellitus without complications: Secondary | ICD-10-CM | POA: Diagnosis not present

## 2022-04-26 DIAGNOSIS — I2510 Atherosclerotic heart disease of native coronary artery without angina pectoris: Secondary | ICD-10-CM | POA: Insufficient documentation

## 2022-04-26 DIAGNOSIS — E785 Hyperlipidemia, unspecified: Secondary | ICD-10-CM | POA: Diagnosis not present

## 2022-04-26 LAB — CBC
HCT: 37.6 % (ref 36.0–46.0)
Hemoglobin: 12.1 g/dL (ref 12.0–15.0)
MCH: 32 pg (ref 26.0–34.0)
MCHC: 32.2 g/dL (ref 30.0–36.0)
MCV: 99.5 fL (ref 80.0–100.0)
Platelets: 180 10*3/uL (ref 150–400)
RBC: 3.78 MIL/uL — ABNORMAL LOW (ref 3.87–5.11)
RDW: 12.8 % (ref 11.5–15.5)
WBC: 6.5 10*3/uL (ref 4.0–10.5)
nRBC: 0 % (ref 0.0–0.2)

## 2022-04-26 LAB — BASIC METABOLIC PANEL
Anion gap: 7 (ref 5–15)
BUN: 17 mg/dL (ref 8–23)
CO2: 27 mmol/L (ref 22–32)
Calcium: 9.9 mg/dL (ref 8.9–10.3)
Chloride: 108 mmol/L (ref 98–111)
Creatinine, Ser: 1.18 mg/dL — ABNORMAL HIGH (ref 0.44–1.00)
GFR, Estimated: 46 mL/min — ABNORMAL LOW (ref >60)
Glucose, Bld: 59 mg/dL — ABNORMAL LOW (ref 70–99)
Potassium: 4.5 mmol/L (ref 3.5–5.1)
Sodium: 142 mmol/L (ref 135–145)

## 2022-04-26 LAB — HEMOGLOBIN A1C
Hgb A1c MFr Bld: 5.2 % (ref 4.8–5.6)
Mean Plasma Glucose: 102.54 mg/dL

## 2022-04-26 LAB — SURGICAL PCR SCREEN
MRSA, PCR: POSITIVE — AB
Staphylococcus aureus: POSITIVE — AB

## 2022-04-26 LAB — GLUCOSE, CAPILLARY: Glucose-Capillary: 78 mg/dL (ref 70–99)

## 2022-04-26 MED ORDER — ONETOUCH ULTRA VI STRP
ORAL_STRIP | 5 refills | Status: AC
Start: 2022-04-26 — End: ?

## 2022-04-26 NOTE — Progress Notes (Addendum)
PCP - Wanda Plump, MD Pt reports that her next appointment with PCP was scheduled 05/03/22, but she cancelled this d/t it being the day after her surgery.  Cardiologist - Runell Gess, MD Endocrinologist- Dr. Reather Littler Oncologist- Dr. Arlan Organ  PPM/ICD - denies  Chest x-ray - denies EKG - 01/11/22 Stress Test - 05/15/19 ECHO - 05/12/19 Cardiac Cath - 12/13/06  Sleep Study - denies   Fasting Blood Sugar - CBG 78 at PAT. A1c drawn today. Pt states she has not checked her blood sugar at home in a month d/t not having strips. Pt states her endocrinologist gave her a prescription for a CGM, but she has no clue how to set it up. Pt states she has been unable to get ahold of endocrinologist to help set this up. Pt instructed to contact endocrinologist about this. Pt instructed that the pharmacy that provided her insulin may be able to help her as well. Pt states she prefers to use test strips and to stick her finger. Pt states she will go to endocrinologist's office today regarding this.  Last dose of GLP1 agonist-  Ozempic- 04/23/22  Pt reports that her oncologist told her to stop taking Venclexta after 04/23/22.   Blood Thinner Instructions: Pt reports she thinks that she was told to stop taking Plavix 5 days prior to surgery (Last dose today). Pt states that she is unsure whether she should stop taking her Aspirin. Pt instructed to contact surgeon's office regarding this.  Pt states she will contact surgeon's office. I notified Dr. Rosezena Sensor office that patient is unsure about blood thinner instructions. Office will reach out to patient.   Per cardiologist note on 04/13/22 from Carlos Levering, NP "Per office protocol, he may hold Plavix for 5 days prior to procedure and should resume as soon as hemodynamically stable postoperatively.    Ideally aspirin should be continued without interruption, however if the bleeding risk is too great, aspirin may be held for 5-7 days prior to surgery.  Please resume aspirin post operatively when it is felt to be safe from a bleeding standpoint. "  ERAS Protcol - ERAS per order   COVID TEST- N/A   Anesthesia review: cardiac history- hx CAD- pt denies any recent cardiac symptoms.   Patient denies shortness of breath, fever, cough and chest pain at PAT appointment   All instructions explained to the patient, with a verbal understanding of the material. Patient agrees to go over the instructions while at home for a better understanding. The opportunity to ask questions was provided.

## 2022-04-26 NOTE — Progress Notes (Signed)
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 Dr. Rosezena Sensor office notified.

## 2022-04-26 NOTE — Addendum Note (Signed)
Addended by: Kenyon Ana on: 04/26/2022 11:49 AM   Modules accepted: Orders

## 2022-04-26 NOTE — Telephone Encounter (Signed)
MEDICATION: glucose blood (ONETOUCH ULTRA) test strip [[id number]]   PHARMACY:  [facility] on [address] &   HAS THE PATIENT CONTACTED THEIR PHARMACY?    IS THIS A 90 DAY SUPPLY :   IS PATIENT OUT OF MEDICATION:   IF NOT; HOW MUCH IS LEFT:   LAST APPOINTMENT DATE: /14/2024  NEXT APPOINTMENT DATE:@5 /09/2022  DO WE HAVE YOUR PERMISSION TO LEAVE A DETAILED MESSAGE?:  OTHER COMMENTS:    **Let patient know to contact pharmacy at the end of the day to make sure medication is ready. **  ** Please notify patient to allow 48-72 hours to process**  **Encourage patient to contact the pharmacy for refills or they can request refills through [facility]**

## 2022-04-26 NOTE — Telephone Encounter (Signed)
RX sent

## 2022-04-26 NOTE — Progress Notes (Signed)
Surgical Instructions    Your procedure is scheduled on Tuesday April 30th.  Report to [facility] Main Entrance "A" at 9:30 A.M., then check in with the Admitting office.  Call this number if you have problems the morning of surgery:  [phone]   If you have any questions prior to your surgery date call [phone]: Open Monday-Friday 8am-4pm If you experience any cold or flu symptoms such as cough, fever, chills, shortness of breath, etc. between now and your scheduled surgery, please notify us at the above number     Remember:  Do not eat after midnight the night before your surgery  You may drink clear liquids until 8:30am the morning of your surgery.   Clear liquids allowed are: Water, Non-Citrus Juices (without pulp), Carbonated Beverages, Clear Tea, Black Coffee ONLY (NO MILK, CREAM OR POWDERED CREAMER of any kind), and Gatorade     Take these medicines the morning of surgery with A SIP OF WATER: amLODipine (NORVASC) 5 MG tablet  atenolol (TENORMIN) 100 MG tablet  atorvastatin (LIPITOR) 20 MG tablet  isosorbide mononitrate (IMDUR) 30 MG 24 hr tablet  omeprazole (PRILOSEC) 20 MG capsule   IF NEEDED  acetaminophen (TYLENOL) 500 MG tablet  ondansetron (ZOFRAN) 8 MG tablet    Follow your surgeon's instructions on when to stop Aspirin and Plavix.  If no instructions were given by your surgeon then you will need to call the office to get those instructions.     As of today, STOP taking any Aleve, Naproxen, Ibuprofen, Motrin, Advil, Goody's, BC's, all herbal medications, fish oil, and all vitamins.  WHAT DO I DO ABOUT MY DIABETES MEDICATION?   Do not take oral diabetes medicines the morning of surgery- metformin (Glucophage)  THE DAY BEFORE SUGERY - take your usual dose of your Humulin R insulin - before meals but DO NOT take any bedtime dose.   THE NIGHT BEFORE SURGERY, take 50% of you Humulin N (NPH) dose - 12 units of Humulin N insulin.       THE MORNING OF  SURGERY, take 50% of you Humulin N (NPH) dose - 18 units of Humulin N insulin.  DO NOT TAKE Semaglutide for 7 days prior to surgery. DO NOT TAKE after 04/24/22.   The day of surgery, do not take other diabetes injectables, including Byetta (exenatide), Bydureon (exenatide ER), Victoza (liraglutide), or Trulicity (dulaglutide).  If your CBG is greater than 220 mg/dL, you may take  of your sliding scale (correction) dose of insulin. You may use 50% of your Humulin R insulin if your blood sugar is more than 220.    HOW TO MANAGE YOUR DIABETES BEFORE AND AFTER SURGERY  Why is it important to control my blood sugar before and after surgery? Improving blood sugar levels before and after surgery helps healing and can limit problems. A way of improving blood sugar control is eating a healthy diet by:  Eating less sugar and carbohydrates  Increasing activity/exercise  Talking with your doctor about reaching your blood sugar goals High blood sugars (greater than 180 mg/dL) can raise your risk of infections and slow your recovery, so you will need to focus on controlling your diabetes during the weeks before surgery. Make sure that the doctor who takes care of your diabetes knows about your planned surgery including the date and location.  How do I manage my blood sugar before surgery? Check your blood sugar at least 4 times a day, starting 2 days before surgery, to make sure  that the level is not too high or low.  Check your blood sugar the morning of your surgery when you wake up and every 2 hours until you get to the Short Stay unit.  If your blood sugar is less than 70 mg/dL, you will need to treat for low blood sugar: Do not take insulin. Treat a low blood sugar (less than 70 mg/dL) with  cup of clear juice (cranberry or apple), 4 glucose tablets, OR glucose gel. Recheck blood sugar in 15 minutes after treatment (to make sure it is greater than 70 mg/dL). If your blood sugar is not greater  than 70 mg/dL on recheck, call [phone] for further instructions. Report your blood sugar to the short stay nurse when you get to Short Stay.  If you are admitted to the hospital after surgery: Your blood sugar will be checked by the staff and you will probably be given insulin after surgery (instead of oral diabetes medicines) to make sure you have good blood sugar levels. The goal for blood sugar control after surgery is 80-180 mg/dL.  [facility] is not responsible for any belongings or valuables.    Do NOT Smoke (Tobacco/Vaping)  24 hours prior to your procedure  If you use a CPAP at night, you may bring your mask for your overnight stay.   Contacts, glasses, hearing aids, dentures or partials may not be worn into surgery, please bring cases for these belongings   For patients admitted to the hospital, discharge time will be determined by your treatment team.   Patients discharged the day of surgery will not be allowed to drive home, and someone needs to stay with them for 24 hours.   SURGICAL WAITING ROOM VISITATION Patients having surgery or a procedure may have no more than 2 support people in the waiting area - these visitors may rotate.   Children under the age of 42 must have an adult with them who is not the patient. If the patient needs to stay at the hospital during part of their recovery, the visitor guidelines for inpatient rooms apply. Pre-op nurse will coordinate an appropriate time for 1 support person to accompany patient in pre-op.  This support person may not rotate.   Please refer to [email or web address] for the visitor guidelines for Inpatients (after your surgery is over and you are in a regular room).    Special instructions:    Oral Hygiene is also important to reduce your risk of infection.  Remember - BRUSH YOUR TEETH THE MORNING OF SURGERY WITH YOUR REGULAR TOOTHPASTE   [place]- Preparing For  Surgery  Before surgery, you can play an important role. Because skin is not sterile, your skin needs to be as free of germs as possible. You can reduce the number of germs on your skin by washing with CHG (chlorahexidine gluconate) Soap before surgery.  CHG is an antiseptic cleaner which kills germs and bonds with the skin to continue killing germs even after washing.     Please do not use if you have an allergy to CHG or antibacterial soaps. If your skin becomes reddened/irritated stop using the CHG.  Do not shave (including legs and underarms) for at least 48 hours prior to first CHG shower. It is OK to shave your face.  Please follow these instructions carefully.     Shower the NIGHT BEFORE SURGERY and the MORNING OF SURGERY with CHG Soap.   If you chose to wash your hair, wash your hair first  as usual with your normal shampoo. After you shampoo, rinse your hair and body thoroughly to remove the shampoo.  Then [employer] and genitals (private parts) with your normal soap and rinse thoroughly to remove soap.  After that Use CHG Soap as you would any other liquid soap. You can apply CHG directly to the skin and wash gently with a scrungie or a clean washcloth.   Apply the CHG Soap to your body ONLY FROM THE NECK DOWN.  Do not use on open wounds or open sores. Avoid contact with your eyes, ears, mouth and genitals (private parts). Wash Face and genitals (private parts)  with your normal soap.   Wash thoroughly, paying special attention to the area where your surgery will be performed.  Thoroughly rinse your body with warm water from the neck down.  DO NOT shower/wash with your normal soap after using and rinsing off the CHG Soap.  Pat yourself dry with a CLEAN TOWEL.  Wear CLEAN PAJAMAS to bed the night before surgery  Place CLEAN SHEETS on your bed the night before your surgery  DO NOT SLEEP WITH PETS.   Day of Surgery:  Take a shower with CHG soap. Wear Clean/Comfortable  clothing the morning of surgery Do not wear jewelry or makeup. Do not wear lotions, powders, perfumes or deodorant. Do not shave 48 hours prior to surgery.   Do not bring valuables to the hospital. Do not wear nail polish, gel polish, artificial nails, or any other type of covering on natural nails (fingers and toes) If you have artificial nails or gel coating that need to be removed by a nail salon, please have this removed prior to surgery. Artificial nails or gel coating may interfere with anesthesia's ability to adequately monitor your vital signs. Remember to brush your teeth WITH YOUR REGULAR TOOTHPASTE.    If you received a COVID test during your pre-op visit, it is requested that you wear a mask when out in public, stay away from anyone that may not be feeling well, and notify your surgeon if you develop symptoms. If you have been in contact with anyone that has tested positive in the last 10 days, please notify your surgeon.    Please read over the following fact sheets that you were given.

## 2022-04-26 NOTE — Telephone Encounter (Signed)
MEDICATION: glucose blood (ONETOUCH ULTRA) test strip [[id number]]   PHARMACY:  [facility]  HAS THE PATIENT CONTACTED THEIR PHARMACY? yes   IS THIS A 90 DAY SUPPLY : yes  IS PATIENT OUT OF MEDICATION: yes  IF NOT; HOW MUCH IS LEFT:   LAST APPOINTMENT DATE: /14/2024  NEXT APPOINTMENT DATE:@5 /09/2022  DO WE HAVE YOUR PERMISSION TO LEAVE A DETAILED MESSAGE?:  OTHER COMMENTS:    **Let patient know to contact pharmacy at the end of the day to make sure medication is ready. **  ** Please notify patient to allow 48-72 hours to process**  **Encourage patient to contact the pharmacy for refills or they can request refills through [facility]**

## 2022-04-27 ENCOUNTER — Other Ambulatory Visit: Payer: Self-pay | Admitting: Cardiovascular Disease

## 2022-04-27 ENCOUNTER — Ambulatory Visit: Payer: Self-pay | Admitting: Surgery

## 2022-04-27 MED ORDER — VANCOMYCIN HCL 10 G IV SOLR: 1000.0000 mg | Freq: Once | INTRAVENOUS | Status: AC

## 2022-04-27 NOTE — Progress Notes (Signed)
Anesthesia Chart Review:  Follows with cardiology for history of CAD s/p CABG x 5, HTN, HLD.  Low risk stress test May 2021.  Seen by Sara Levering, NP 04/13/2022 for preop evaluation.  Per note, "Chart reviewed as part of pre-operative protocol coverage. According to the RCRI, patient has a 6.6% risk of MACE. Patient reports activity equivalent to >4.0 METS (independent with all ADLs, light to heavy household tasks, and yard work including raking). Given past medical history and time since last visit, based on ACC/AHA guidelines, [name] would be at acceptable risk for the planned procedure without further cardiovascular testing. Patient was advised that if she develops new symptoms prior to surgery to contact our office to arrange a follow-up appointment.  she verbalized understanding. Per office protocol, he may hold Plavix for 5 days prior to procedure and should resume as soon as hemodynamically stable postoperatively.  Ideally aspirin should be continued without interruption, however if the bleeding risk is too great, aspirin may be held for 5-7 days prior to surgery. 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  Last seen by Dr. Myna Myers 03/27/2022.  Upcoming surgery discussed.  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  I really do not think this should be a problem for [place] in the future. I do not see a problem with her having a lumpectomy or even having radiation therapy afterwards.  I am not sure she would even need radiation given her age. Again, I told her to stop the venetoclax about a week or so before surgery.  She is not sure when surgery will be.  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  Patient on once weekly GLP-1 agonist Ozempic, reports last dose 04/23/2022.  Preop labs  reviewed, creatinine mildly elevated 1.18, otherwise unremarkable.  EKG 01/11/2022 (read per Dr. Hazle Coca note same date): Sinus rhythm.  Rate 64.  Right bundle branch block.  Nuclear stress 05/15/2019: Nuclear stress EF: 62%. No regional wall motion abnormalities. There was no ST segment deviation noted during stress. Defect 1: There is a medium defect of moderate severity present in the mid inferolateral location. Focal area of mid inferolateral hypoperfusion noted. This is a low risk study. No significant ischemia identified. Possible old mid inferolateral infarct pattern.  TTE 05/12/2019:  1. Left ventricular ejection fraction, by estimation, is 55 to 60%. The  left ventricle has normal function. The left ventricle has no regional  wall motion abnormalities. Left ventricular diastolic parameters are  indeterminate.   2. Right ventricular systolic function is normal. The right ventricular  size is normal.   3. Left atrial size was mildly dilated.   4. The mitral valve is normal in structure. Trivial mitral valve  regurgitation. No evidence of mitral stenosis.   5. The aortic valve is normal in structure. Aortic valve regurgitation is  not visualized. No aortic stenosis is present.    [name] [facility] Short Stay Center/Anesthesiology Phone [phone] 04/27/2022 1:36 PM

## 2022-04-27 NOTE — Anesthesia Preprocedure Evaluation (Addendum)
Anesthesia Evaluation  Patient identified by MRN, date of birth, ID band Patient awake    Reviewed: Allergy & Precautions, NPO status , Patient's Chart, lab work & pertinent test results, reviewed documented beta blocker date and time   Airway Mallampati: II  TM Distance: >3 FB     Dental  (+) Edentulous Upper, Edentulous Lower   Pulmonary shortness of breath and with exertion, pneumonia, resolved   Pulmonary exam normal breath sounds clear to auscultation       Cardiovascular hypertension, Pt. on medications and Pt. on home beta blockers + angina with exertion + CAD and + Past MI  Normal cardiovascular exam Rhythm:Regular Rate:Normal  MI and CABG 1997  EKG 01/11/22 NSR, 1st deg AVB, inc. RBBB pattern, non specific ST-T wave changes   Neuro/Psych  Headaches PSYCHIATRIC DISORDERS Anxiety Depression       GI/Hepatic hiatal hernia, PUD,GERD  Medicated,,  Endo/Other  diabetes, Well Controlled, Type 2, Insulin Dependent, Oral Hypoglycemic Agents  DCIS left breast HLD GLP-1 RA therapy- last dose 4/22  Renal/GU Renal InsufficiencyRenal disease  negative genitourinary   Musculoskeletal  (+) Arthritis , Osteoarthritis,    Abdominal   Peds  Hematology  (+) Blood dyscrasia, anemia Hx/o CLL Plavix therapy- last dose 4/25   Anesthesia Other Findings   Reproductive/Obstetrics                              Anesthesia Physical Anesthesia Plan  ASA: 3  Anesthesia Plan: General   Post-op Pain Management: Minimal or no pain anticipated   Induction: Intravenous  PONV Risk Score and Plan: 4 or greater and Treatment may vary due to age or medical condition and Ondansetron  Airway Management Planned: LMA  Additional Equipment: None  Intra-op Plan:   Post-operative Plan: Extubation in OR  Informed Consent: I have reviewed the patients History and Physical, chart, labs and discussed the procedure  including the risks, benefits and alternatives for the proposed anesthesia with the patient or authorized representative who has indicated his/her understanding and acceptance.     Dental advisory given  Plan Discussed with: CRNA and Anesthesiologist  Anesthesia Plan Comments: (PAT note by Antionette Poles, PA-C:  Follows with cardiology for history of CAD s/p CABG x 5, HTN, HLD.  Low risk stress test May 2021.  Seen by Carlos Levering, NP 04/13/2022 for preop evaluation.  Per note, "Chart reviewed as part of pre-operative protocol coverage. According to the RCRI, patient has a 6.6% risk of MACE. Patient reports activity equivalent to >4.0 METS (independent with all ADLs, light to heavy household tasks, and yard work including raking). Given past medical history and time since last visit, based on ACC/AHA guidelines, Beatryce H Arline would be at acceptable risk for the planned procedure without further cardiovascular testing. Patient was advised that if she develops new symptoms prior to surgery to contact our office to arrange a follow-up appointment.  she verbalized understanding. Per office protocol, he may hold Plavix for 5 days prior to procedure and should resume as soon as hemodynamically stable postoperatively.  Ideally aspirin should be continued without interruption, however if the bleeding risk is too great, aspirin may be held for 5-7 days prior to surgery. 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  Last seen by Dr. Myna Hidalgo 03/27/2022.  Upcoming surgery discussed.  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  I really do not think this should be a problem for [place] in the future. I do not see a problem with her having a lumpectomy or even having radiation therapy afterwards.  I am not sure she would even need radiation given her age. Again, I told  her to stop the venetoclax about a week or so before surgery.  She is not sure when surgery will be.  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  Patient on once weekly GLP-1 agonist Ozempic, reports last dose 04/23/2022.  Preop labs reviewed, creatinine mildly elevated 1.18, otherwise unremarkable.  EKG 01/11/2022 (read per Dr. Hazle Coca note same date): Sinus rhythm.  Rate 64.  Right bundle branch block.  Nuclear stress 05/15/2019: ? Nuclear stress EF: 62%. No regional wall motion abnormalities. ? There was no ST segment deviation noted during stress. ? Defect 1: There is a medium defect of moderate severity present in the mid inferolateral location. Focal area of mid inferolateral hypoperfusion noted. ? This is a low risk study. No significant ischemia identified. Possible old mid inferolateral infarct pattern.  TTE 05/12/2019:  1. Left ventricular ejection fraction, by estimation, is 55 to 60%. The  left ventricle has normal function. The left ventricle has no regional  wall motion abnormalities. Left ventricular diastolic parameters are  indeterminate.   2. Right ventricular systolic function is normal. The right ventricular  size is normal.   3. Left atrial size was mildly dilated.   4. The mitral valve is normal in structure. Trivial mitral valve  regurgitation. No evidence of mitral stenosis.   5. The aortic valve is normal in structure. Aortic valve regurgitation is  not visualized. No aortic stenosis is present.   )        Anesthesia Quick Evaluation

## 2022-04-28 ENCOUNTER — Ambulatory Visit: Payer: Medicare Other | Admitting: Cardiovascular Disease

## 2022-05-01 ENCOUNTER — Ambulatory Visit
Admission: RE | Admit: 2022-05-01 | Discharge: 2022-05-01 | Disposition: A | Discharge status: Home / Self Care | Payer: Medicare Other | Source: Ambulatory Visit | Attending: Surgery | Admitting: Surgery

## 2022-05-01 ENCOUNTER — Other Ambulatory Visit: Payer: Self-pay

## 2022-05-01 DIAGNOSIS — R921 Mammographic calcification found on diagnostic imaging of breast: Secondary | ICD-10-CM | POA: Diagnosis not present

## 2022-05-01 DIAGNOSIS — D0512 Intraductal carcinoma in situ of left breast: Secondary | ICD-10-CM

## 2022-05-01 HISTORY — PX: BREAST BIOPSY: SHX20

## 2022-05-02 ENCOUNTER — Encounter (HOSPITAL_COMMUNITY): Payer: Self-pay | Admitting: Surgery

## 2022-05-02 ENCOUNTER — Ambulatory Visit (HOSPITAL_COMMUNITY): Payer: Medicare Other | Admitting: Physician Assistant

## 2022-05-02 ENCOUNTER — Ambulatory Visit (HOSPITAL_COMMUNITY)
Admission: RE | Admit: 2022-05-02 | Discharge: 2022-05-02 | Disposition: A | Discharge status: Home / Self Care | Payer: Medicare Other | Attending: Surgery | Admitting: Surgery

## 2022-05-02 ENCOUNTER — Encounter (HOSPITAL_COMMUNITY)
Admission: RE | Disposition: A | Discharge status: Home / Self Care | Payer: Self-pay | Source: Home / Self Care | Attending: Surgery

## 2022-05-02 ENCOUNTER — Other Ambulatory Visit: Payer: Self-pay

## 2022-05-02 ENCOUNTER — Ambulatory Visit (HOSPITAL_BASED_OUTPATIENT_CLINIC_OR_DEPARTMENT_OTHER): Payer: Medicare Other | Admitting: Anesthesiology

## 2022-05-02 ENCOUNTER — Ambulatory Visit
Admission: RE | Admit: 2022-05-02 | Discharge: 2022-05-02 | Disposition: A | Discharge status: Home / Self Care | Payer: Medicare Other | Source: Ambulatory Visit | Attending: Surgery | Admitting: Surgery

## 2022-05-02 DIAGNOSIS — E119 Type 2 diabetes mellitus without complications: Secondary | ICD-10-CM | POA: Diagnosis not present

## 2022-05-02 DIAGNOSIS — K449 Diaphragmatic hernia without obstruction or gangrene: Secondary | ICD-10-CM | POA: Diagnosis not present

## 2022-05-02 DIAGNOSIS — Z7984 Long term (current) use of oral hypoglycemic drugs: Secondary | ICD-10-CM | POA: Diagnosis not present

## 2022-05-02 DIAGNOSIS — Z951 Presence of aortocoronary bypass graft: Secondary | ICD-10-CM | POA: Insufficient documentation

## 2022-05-02 DIAGNOSIS — E785 Hyperlipidemia, unspecified: Secondary | ICD-10-CM | POA: Diagnosis not present

## 2022-05-02 DIAGNOSIS — F418 Other specified anxiety disorders: Secondary | ICD-10-CM | POA: Insufficient documentation

## 2022-05-02 DIAGNOSIS — D0512 Intraductal carcinoma in situ of left breast: Secondary | ICD-10-CM

## 2022-05-02 DIAGNOSIS — Z7902 Long term (current) use of antithrombotics/antiplatelets: Secondary | ICD-10-CM | POA: Diagnosis not present

## 2022-05-02 DIAGNOSIS — K219 Gastro-esophageal reflux disease without esophagitis: Secondary | ICD-10-CM | POA: Insufficient documentation

## 2022-05-02 DIAGNOSIS — I252 Old myocardial infarction: Secondary | ICD-10-CM | POA: Insufficient documentation

## 2022-05-02 DIAGNOSIS — D630 Anemia in neoplastic disease: Secondary | ICD-10-CM | POA: Diagnosis not present

## 2022-05-02 DIAGNOSIS — Z794 Long term (current) use of insulin: Secondary | ICD-10-CM | POA: Diagnosis not present

## 2022-05-02 DIAGNOSIS — I10 Essential (primary) hypertension: Secondary | ICD-10-CM | POA: Diagnosis not present

## 2022-05-02 DIAGNOSIS — Z0189 Encounter for other specified special examinations: Secondary | ICD-10-CM | POA: Diagnosis not present

## 2022-05-02 DIAGNOSIS — I2510 Atherosclerotic heart disease of native coronary artery without angina pectoris: Secondary | ICD-10-CM | POA: Insufficient documentation

## 2022-05-02 DIAGNOSIS — I25119 Atherosclerotic heart disease of native coronary artery with unspecified angina pectoris: Secondary | ICD-10-CM

## 2022-05-02 DIAGNOSIS — C9110 Chronic lymphocytic leukemia of B-cell type not having achieved remission: Secondary | ICD-10-CM | POA: Insufficient documentation

## 2022-05-02 DIAGNOSIS — Z7985 Long-term (current) use of injectable non-insulin antidiabetic drugs: Secondary | ICD-10-CM | POA: Insufficient documentation

## 2022-05-02 HISTORY — PX: BREAST LUMPECTOMY WITH RADIOACTIVE SEED LOCALIZATION: SHX6424

## 2022-05-02 LAB — GLUCOSE, CAPILLARY
Glucose-Capillary: 88 mg/dL (ref 70–99)
Glucose-Capillary: 75 mg/dL (ref 70–99)
Glucose-Capillary: 57 mg/dL — ABNORMAL LOW (ref 70–99)
Glucose-Capillary: 59 mg/dL — ABNORMAL LOW (ref 70–99)
Glucose-Capillary: 85 mg/dL (ref 70–99)

## 2022-05-02 SURGERY — BREAST LUMPECTOMY WITH RADIOACTIVE SEED LOCALIZATION
Anesthesia: General | Site: Breast | Laterality: Left

## 2022-05-02 MED ORDER — BUPIVACAINE HCL 0.25 % IJ SOLN
INTRAMUSCULAR | Status: DC | PRN
  Administered 2022-05-02: 30 mL

## 2022-05-02 MED ORDER — OXYCODONE HCL 5 MG PO TABS
ORAL_TABLET | ORAL | Status: AC
  Filled 2022-05-02: qty 1

## 2022-05-02 MED ORDER — OXYCODONE HCL 5 MG/5ML PO SOLN: 5.0000 mg | Freq: Once | ORAL | Status: AC | PRN

## 2022-05-02 MED ORDER — CHLORHEXIDINE GLUCONATE CLOTH 2 % EX PADS: 6.0000 | MEDICATED_PAD | Freq: Once | CUTANEOUS | Status: DC

## 2022-05-02 MED ORDER — CHLORHEXIDINE GLUCONATE 0.12 % MT SOLN
15.0000 mL | Freq: Once | OROMUCOSAL | Status: AC
  Administered 2022-05-02: 15 mL via OROMUCOSAL
  Filled 2022-05-02: qty 15

## 2022-05-02 MED ORDER — PROPOFOL 10 MG/ML IV BOLUS
INTRAVENOUS | Status: AC
  Filled 2022-05-02: qty 20

## 2022-05-02 MED ORDER — DEXAMETHASONE SODIUM PHOSPHATE 10 MG/ML IJ SOLN
INTRAMUSCULAR | Status: AC
  Filled 2022-05-02: qty 1

## 2022-05-02 MED ORDER — BUPIVACAINE HCL (PF) 0.25 % IJ SOLN
INTRAMUSCULAR | Status: AC
  Filled 2022-05-02: qty 30

## 2022-05-02 MED ORDER — ONDANSETRON HCL 4 MG/2ML IJ SOLN
INTRAMUSCULAR | Status: AC
  Filled 2022-05-02: qty 2

## 2022-05-02 MED ORDER — OXYCODONE HCL 5 MG PO TABS
5.0000 mg | ORAL_TABLET | Freq: Once | ORAL | Status: AC | PRN
  Administered 2022-05-02: 5 mg via ORAL

## 2022-05-02 MED ORDER — GLYCOPYRROLATE PF 0.2 MG/ML IJ SOSY
PREFILLED_SYRINGE | INTRAMUSCULAR | Status: AC
  Filled 2022-05-02: qty 1

## 2022-05-02 MED ORDER — GLYCOPYRROLATE PF 0.2 MG/ML IJ SOSY
PREFILLED_SYRINGE | INTRAMUSCULAR | Status: DC | PRN
  Administered 2022-05-02 (×2): .1 mg via INTRAVENOUS

## 2022-05-02 MED ORDER — ACETAMINOPHEN 500 MG PO TABS
1000.0000 mg | ORAL_TABLET | ORAL | Status: AC
  Administered 2022-05-02: 1000 mg via ORAL
  Filled 2022-05-02: qty 2

## 2022-05-02 MED ORDER — LIDOCAINE 2% (20 MG/ML) 5 ML SYRINGE
INTRAMUSCULAR | Status: DC | PRN
  Administered 2022-05-02: 80 mg via INTRAVENOUS

## 2022-05-02 MED ORDER — BUPIVACAINE-EPINEPHRINE (PF) 0.5% -1:200000 IJ SOLN
INTRAMUSCULAR | Status: AC
  Filled 2022-05-02: qty 30

## 2022-05-02 MED ORDER — FENTANYL CITRATE (PF) 100 MCG/2ML IJ SOLN
INTRAMUSCULAR | Status: AC
  Filled 2022-05-02: qty 2

## 2022-05-02 MED ORDER — DEXTROSE 50 % IV SOLN
INTRAVENOUS | Status: AC
  Filled 2022-05-02: qty 50

## 2022-05-02 MED ORDER — OXYCODONE HCL 5 MG PO TABS
5.0000 mg | ORAL_TABLET | Freq: Four times a day (QID) | ORAL | 0 refills | Status: DC | PRN
Start: 2022-05-02 — End: 2023-01-08

## 2022-05-02 MED ORDER — ORAL CARE MOUTH RINSE: 15.0000 mL | Freq: Once | OROMUCOSAL | Status: AC

## 2022-05-02 MED ORDER — ONDANSETRON HCL 4 MG PO TABS: 4.0000 mg | ORAL_TABLET | Freq: Three times a day (TID) | ORAL | 0 refills | Status: AC | PRN

## 2022-05-02 MED ORDER — DEXTROSE 50 % IV SOLN
12.5000 mL | Freq: Once | INTRAVENOUS | Status: AC
  Administered 2022-05-02: 12.5 mL via INTRAVENOUS

## 2022-05-02 MED ORDER — PHENYLEPHRINE HCL-NACL 20-0.9 MG/250ML-% IV SOLN
INTRAVENOUS | Status: DC | PRN
  Administered 2022-05-02: 25 ug/min via INTRAVENOUS

## 2022-05-02 MED ORDER — ONDANSETRON HCL 4 MG/2ML IJ SOLN
INTRAMUSCULAR | Status: DC | PRN
  Administered 2022-05-02: 4 mg via INTRAVENOUS

## 2022-05-02 MED ORDER — 0.9 % SODIUM CHLORIDE (POUR BTL) OPTIME
TOPICAL | Status: DC | PRN
  Administered 2022-05-02: 1000 mL

## 2022-05-02 MED ORDER — FENTANYL CITRATE (PF) 250 MCG/5ML IJ SOLN
INTRAMUSCULAR | Status: AC
  Filled 2022-05-02: qty 5

## 2022-05-02 MED ORDER — AMISULPRIDE (ANTIEMETIC) 5 MG/2ML IV SOLN
INTRAVENOUS | Status: AC
  Filled 2022-05-02: qty 4

## 2022-05-02 MED ORDER — ONDANSETRON HCL 4 MG/2ML IJ SOLN: 4.0000 mg | Freq: Once | INTRAMUSCULAR | Status: DC | PRN

## 2022-05-02 MED ORDER — LIDOCAINE 2% (20 MG/ML) 5 ML SYRINGE
INTRAMUSCULAR | Status: AC
  Filled 2022-05-02: qty 5

## 2022-05-02 MED ORDER — DEXAMETHASONE SODIUM PHOSPHATE 10 MG/ML IJ SOLN
INTRAMUSCULAR | Status: DC | PRN
  Administered 2022-05-02: 5 mg via INTRAVENOUS

## 2022-05-02 MED ORDER — EPHEDRINE SULFATE-NACL 50-0.9 MG/10ML-% IV SOSY
PREFILLED_SYRINGE | INTRAVENOUS | Status: DC | PRN
  Administered 2022-05-02 (×4): 5 mg via INTRAVENOUS

## 2022-05-02 MED ORDER — LACTATED RINGERS IV SOLN: INTRAVENOUS | Status: DC

## 2022-05-02 MED ORDER — PROPOFOL 10 MG/ML IV BOLUS
INTRAVENOUS | Status: DC | PRN
  Administered 2022-05-02: 120 mg via INTRAVENOUS

## 2022-05-02 MED ORDER — VANCOMYCIN HCL 1250 MG/250ML IV SOLN
1250.0000 mg | Freq: Once | INTRAVENOUS | Status: AC
  Administered 2022-05-02: 1250 mg via INTRAVENOUS
  Filled 2022-05-02: qty 250

## 2022-05-02 MED ORDER — EPHEDRINE 5 MG/ML INJ
INTRAVENOUS | Status: AC
  Filled 2022-05-02: qty 5

## 2022-05-02 MED ORDER — CEFAZOLIN SODIUM-DEXTROSE 2-4 GM/100ML-% IV SOLN
2.0000 g | INTRAVENOUS | Status: DC
  Filled 2022-05-02: qty 100

## 2022-05-02 MED ORDER — AMISULPRIDE (ANTIEMETIC) 5 MG/2ML IV SOLN
10.0000 mg | Freq: Once | INTRAVENOUS | Status: AC | PRN
  Administered 2022-05-02: 10 mg via INTRAVENOUS

## 2022-05-02 MED ORDER — FENTANYL CITRATE (PF) 250 MCG/5ML IJ SOLN
INTRAMUSCULAR | Status: DC | PRN
  Administered 2022-05-02: 50 ug via INTRAVENOUS

## 2022-05-02 MED ORDER — FENTANYL CITRATE (PF) 100 MCG/2ML IJ SOLN
25.0000 ug | INTRAMUSCULAR | Status: DC | PRN
  Administered 2022-05-02: 50 ug via INTRAVENOUS
  Administered 2022-05-02: 25 ug via INTRAVENOUS

## 2022-05-02 SURGICAL SUPPLY — 35 items
ADH SKN CLS APL DERMABOND .7 (GAUZE/BANDAGES/DRESSINGS) ×1
APL PRP STRL LF DISP 70% ISPRP (MISCELLANEOUS) ×1
APPLIER CLIP 9.375 MED OPEN (MISCELLANEOUS) ×1
APR CLP MED 9.3 20 MLT OPN (MISCELLANEOUS) ×1
BAG COUNTER SPONGE SURGICOUNT (BAG) ×1 IMPLANT
BAG SPNG CNTER NS LX DISP (BAG) ×1
BINDER BREAST XLRG (GAUZE/BANDAGES/DRESSINGS) IMPLANT
CANISTER SUCT 3000ML PPV (MISCELLANEOUS) IMPLANT
CHLORAPREP W/TINT 26 (MISCELLANEOUS) ×1 IMPLANT
CLIP APPLIE 9.375 MED OPEN (MISCELLANEOUS) IMPLANT
COVER PROBE W GEL 5X96 (DRAPES) ×1 IMPLANT
COVER SURGICAL LIGHT HANDLE (MISCELLANEOUS) ×1 IMPLANT
DERMABOND ADVANCED .7 DNX12 (GAUZE/BANDAGES/DRESSINGS) ×1 IMPLANT
DEVICE DUBIN SPECIMEN MAMMOGRA (MISCELLANEOUS) ×1 IMPLANT
DRAPE CHEST BREAST 15X10 FENES (DRAPES) ×1 IMPLANT
ELECT CAUTERY BLADE 6.4 (BLADE) ×1 IMPLANT
ELECT REM PT RETURN 9FT ADLT (ELECTROSURGICAL) ×1
ELECTRODE REM PT RTRN 9FT ADLT (ELECTROSURGICAL) ×1 IMPLANT
GAUZE PAD ABD 8X10 STRL (GAUZE/BANDAGES/DRESSINGS) ×1 IMPLANT
GLOVE BIO SURGEON STRL SZ8 (GLOVE) ×1 IMPLANT
GLOVE BIOGEL PI IND STRL 8 (GLOVE) ×1 IMPLANT
GOWN STRL REUS W/ TWL LRG LVL3 (GOWN DISPOSABLE) ×1 IMPLANT
GOWN STRL REUS W/ TWL XL LVL3 (GOWN DISPOSABLE) ×1 IMPLANT
GOWN STRL REUS W/TWL LRG LVL3 (GOWN DISPOSABLE) ×1
GOWN STRL REUS W/TWL XL LVL3 (GOWN DISPOSABLE) ×1
KIT BASIN OR (CUSTOM PROCEDURE TRAY) ×1 IMPLANT
KIT MARKER MARGIN INK (KITS) ×1 IMPLANT
NDL HYPO 25GX1X1/2 BEV (NEEDLE) ×1 IMPLANT
NEEDLE HYPO 25GX1X1/2 BEV (NEEDLE) ×1 IMPLANT
NS IRRIG 1000ML POUR BTL (IV SOLUTION) IMPLANT
PACK GENERAL/GYN (CUSTOM PROCEDURE TRAY) ×1 IMPLANT
SUT MNCRL AB 4-0 PS2 18 (SUTURE) ×1 IMPLANT
SUT VIC AB 3-0 SH 8-18 (SUTURE) ×1 IMPLANT
SYR CONTROL 10ML LL (SYRINGE) ×1 IMPLANT
TOWEL GREEN STERILE FF (TOWEL DISPOSABLE) IMPLANT

## 2022-05-02 NOTE — H&P (Signed)
History of Present Illness: [name] is a 82 y.o. female who is seen today as an office consultation for evaluation of New Consultation and Breast Cancer  Patient sent for evaluation of abnormal mammogram. The patient was noted to have an abnormal cluster of left breast microcalcifications that were centrally located measuring 2 cm. 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 Denies any history of breast discharge or pain. 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 No history of breast mass and is sore at the biopsy site.  Review of Systems: A complete review of systems was obtained from the patient. I have reviewed this information and discussed as appropriate with the patient. See HPI as well for other ROS.    Medical History: Past Medical History:  Diagnosis Date  Anemia  Arthritis  GERD (gastroesophageal reflux disease)  History of cancer  Hyperlipidemia  Hypertension   There is no problem list on file for this patient.  Past Surgical History:  Procedure Laterality Date  BREAST EXCISIONAL BIOPSY Left  CESAREAN SECTION  HERNIA REPAIR  JOINT REPLACEMENT  Shoulder arthroscopy w/ rotator cuff repair    Allergies  Allergen Reactions  Codeine Other (See Comments)  REACTION: makes her nervous, orTylenol #3  Ibuprofen Other (See Comments)  REACTION: nervous  Meperidine Hcl Nausea And Vomiting  Naproxen Sodium Other (See Comments)  REACTION: nervous  Tramadol Other (See Comments)  Insomnia   Current Outpatient Medications on File Prior to Visit  Medication Sig Dispense Refill  amLODIPine (NORVASC) 5 MG tablet Take 5 mg by mouth once daily  atenoloL (TENORMIN) 100 MG tablet Take 100 mg by mouth once daily  atorvastatin (LIPITOR) 20 MG tablet Take 20 mg by mouth once daily  clopidogreL (PLAVIX) 75 mg tablet Take by mouth  isosorbide mononitrate (IMDUR) 30 MG ER tablet Take 1 tablet by mouth once daily  metFORMIN (GLUCOPHAGE) 500 MG tablet TAKE 1 TABLET(500 MG)  BY MOUTH DAILY WITH SUPPER  omeprazole (PRILOSEC) 20 MG DR capsule TAKE 1 CAPSULE(20 MG) BY MOUTH DAILY  venetoclax (VENCLEXTA) 100 mg tablet Take by mouth   No current facility-administered medications on file prior to visit.   No family history on file.   Social History   Tobacco Use  Smoking Status Never  Smokeless Tobacco Never    Social History   Socioeconomic History  Marital status: Widowed  Tobacco Use  Smoking status: Never  Smokeless tobacco: Never  Substance and Sexual Activity  Alcohol use: Not Currently  Drug use: Never   Objective:   Vitals:  03/17/22 1007  BP: (!) 160/94  Pulse: 69  Temp: 36.7 C (98 F)  SpO2: 96%  Weight: 78 kg (172 lb)  Height: 167.6 cm (5\' 6" )   Body mass index is 27.76 kg/m.  Physical Exam Exam conducted with a chaperone present.  Cardiovascular:  Rate and Rhythm: Normal rate.  Pulmonary:  Effort: Pulmonary effort is normal.  Breath sounds: No stridor.  Chest:  Breasts: Right: Normal. No swelling, bleeding or mass.  Left: Tenderness present. No swelling, inverted nipple or mass.   Comments: Small left from port site. Bruising noted upper central breast. Musculoskeletal:  Cervical back: Normal range of motion.  Lymphadenopathy:  Upper Body:  Right upper body: No supraclavicular or axillary adenopathy.  Left upper body: No supraclavicular or axillary adenopathy.  Neurological:  General: No focal deficit present.  Mental Status: She is alert.  Psychiatric:  Mood and Affect: Mood normal.     Labs, Imaging  and Diagnostic Testing: Mammogram shows a 2 cm cluster of left breast microcalcifications centrally located. 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  Assessment and Plan:   Diagnoses and all orders for this visit:  Neoplasm of breast, primary tumor staging category tis: ductal carcinoma in situ (DCIS), left - Ambulatory Referral to Radiation Oncology   Discussed  breast conserving surgery with mastectomy and reconstruction. She is opted for left breast seed localized lumpectomy. She will see Dr. Dorris Carnes of her of oncology for her CLL and I sent a note. Refer to radiation oncology for consideration of radiation therapy. We compared and contrasted lumpectomy as well as mastectomy reconstruction. We reviewed local regional recurrence rates, survival and complications as well as long-term expectations of each. She has agreed to proceed with left breast seed localized lumpectomy.   Hayden Rasmussen, MD

## 2022-05-02 NOTE — Interval H&P Note (Signed)
History and Physical Interval Note:  05/02/2022 8:58 AM  [name]  has presented today for surgery, with the diagnosis of LEFT BREAST DCIS.  The various methods of treatment have been discussed with the patient and family. After consideration of risks, benefits and other options for treatment, the patient has consented to  Procedure(s): LEFT BREAST LUMPECTOMY WITH RADIOACTIVE SEED LOCALIZATION (Left) as a surgical intervention.  The patient's history has been reviewed, patient examined, no change in status, stable for surgery.  I have reviewed the patient's chart and labs.  Questions were answered to the patient's satisfaction.     [name] A Chinara Hertzberg

## 2022-05-02 NOTE — Anesthesia Procedure Notes (Addendum)
Procedure Name: LMA Insertion Date/Time: 05/02/2022 9:52 AM  Performed by: Sharyn Dross, CRNAPre-anesthesia Checklist: Patient identified, Emergency Drugs available, Suction available and Patient being monitored Patient Re-evaluated:Patient Re-evaluated prior to induction Oxygen Delivery Method: Circle system utilized Preoxygenation: Pre-oxygenation with 100% oxygen Induction Type: IV induction Ventilation: Mask ventilation without difficulty LMA: LMA inserted LMA Size: 4.0 Tube type: Oral Number of attempts: 1 Airway Equipment and Method: Oral airway Placement Confirmation: positive ETCO2 and breath sounds checked- equal and bilateral Tube secured with: Tape Dental Injury: Teeth and Oropharynx as per pre-operative assessment

## 2022-05-02 NOTE — Transfer of Care (Signed)
Immediate Anesthesia Transfer of Care Note  Patient: [name]  Procedure(s) Performed: LEFT BREAST LUMPECTOMY WITH RADIOACTIVE SEED LOCALIZATION (Left: Breast)  Patient Location: PACU  Anesthesia Type:General  Level of Consciousness: awake, alert , and oriented  Airway & Oxygen Therapy: Patient Spontanous Breathing  Post-op Assessment: Report given to RN and Post -op Vital signs reviewed and stable  Post vital signs: Reviewed and stable  Last Vitals:  Vitals Value Taken Time  BP 135/67 05/02/22 1103  Temp    Pulse 60 05/02/22 1105  Resp 15 05/02/22 1105  SpO2 100 % 05/02/22 1105  Vitals shown include unvalidated device data.  Last Pain:  Vitals:   05/02/22 0742  TempSrc: Oral  PainSc: 0-No pain      Patients Stated Pain Goal: 0 (05/02/22 4098)  Complications: There were no known notable events for this encounter.

## 2022-05-02 NOTE — Op Note (Signed)
Preoperative diagnosis: Left breast DCIS upper inner  quadrant  Postoperative diagnosis: Same  Procedure: Left breast seed localized lumpectomy  Surgeon: Harriette Bouillon, MD  Anesthesia: LMA with 0.25% Marcaine plain  EBL: Minimal  Specimen: Left breast tissue with localizing seed in place.  Of note during the placement of the seed the clip was displaced out of the operative field as documented by the radiologist.  Drains: None  IV fluids: Per anesthesia record  Indications for procedure: The patient is an 82 year old female with left breast DCIS.  She is opted for breast conserving surgery after being seen in the multidisciplinary clinic and managed by medical oncology, radiation collagen surgery.  She chose breast conserving surgery after reviewing all of her options as well as additional therapies required.The procedure has been discussed with the patient. Alternatives to surgery have been discussed with the patient.  Risks of surgery include bleeding,  Infection,  Seroma formation, death,  and the need for further surgery.   The patient understands and wishes to proceed.     Description of procedure: The patient was brought to the operating room.  She was seen in the holding area left breast marked as correct site.  Of note a seed was placed as an outpatient.  When she was placed the operative room she was placed upon the OR table.  After induction of general anesthesia, left breast was prepped and draped in sterile fashion and timeout performed.  Proper patient, site and procedure verified and films were available for review.  Neoprobe used to identify the seed in the left medial breast.  This was toward the upper inner quadrant.  Local anesthetic was infiltrated along the medial border of the nipple areolar complex.  A curvilinear incision was made dissection was carried down all tissue around the seed were excised.  Post excision films reviewed the seed to be in the specimen.  Of note the  clip was dislodged therefore was not removed.  This was documented on the radiology films due to displacement to was out of the operative field.  Of note the specimen was oriented with ink.  The cavity is irrigated.  Made hemostatic with cautery.  Local anesthetic infiltrated and clips were used to mark the cavity.  The deep tissue planes were approximated with 3-0 Vicryl.  4 Monocryl was used to close the skin in a subcuticular fashion.  Dermabond applied.  Breast binder placed.  All counts found to be correct.  The patient was awoke extubated taken to recovery in satisfactory condition.

## 2022-05-02 NOTE — Anesthesia Postprocedure Evaluation (Signed)
Anesthesia Post Note  Patient: [name]  Procedure(s) Performed: LEFT BREAST LUMPECTOMY WITH RADIOACTIVE SEED LOCALIZATION (Left: Breast)     Patient location during evaluation: PACU Anesthesia Type: General Level of consciousness: awake and alert and oriented Pain management: pain level controlled Vital Signs Assessment: post-procedure vital signs reviewed and stable Respiratory status: spontaneous breathing, nonlabored ventilation and respiratory function stable Cardiovascular status: blood pressure returned to baseline and stable Postop Assessment: no apparent nausea or vomiting Anesthetic complications: no   There were no known notable events for this encounter.  Last Vitals:  Vitals:   05/02/22 1145 05/02/22 1200  BP: (!) 118/51 120/65  Pulse: (!) 58 (!) 57  Resp: 18 18  Temp:  36.5 C  SpO2: 97% 98%    Last Pain:  Vitals:   05/02/22 1200  TempSrc:   PainSc: 0-No pain                 Hodari Chuba A.

## 2022-05-02 NOTE — Discharge Instructions (Signed)
[facility] Office Phone Number [phone]  BREAST BIOPSY/ PARTIAL MASTECTOMY: POST OP INSTRUCTIONS  Always review your discharge instruction sheet given to you by the facility where your surgery was performed.  IF YOU HAVE DISABILITY OR FAMILY LEAVE FORMS, YOU MUST BRING THEM TO THE OFFICE FOR PROCESSING.  DO NOT GIVE THEM TO YOUR DOCTOR.  A prescription for pain medication may be given to you upon discharge.  Take your pain medication as prescribed, if needed.  If narcotic pain medicine is not needed, then you may take acetaminophen (Tylenol) or ibuprofen (Advil) as needed. Take your usually prescribed medications unless otherwise directed If you need a refill on your pain medication, please contact your pharmacy.  They will contact our office to request authorization.  Prescriptions will not be filled after 5pm or on week-ends. You should eat very light the first 24 hours after surgery, such as soup, crackers, pudding, etc.  Resume your normal diet the day after surgery. Most patients will experience some swelling and bruising in the breast.  Ice packs and a good support bra will help.  Swelling and bruising can take several days to resolve.  It is common to experience some constipation if taking pain medication after surgery.  Increasing fluid intake and taking a stool softener will usually help or prevent this problem from occurring.  A mild laxative (Milk of Magnesia or Miralax) should be taken according to package directions if there are no bowel movements after 48 hours. Unless discharge instructions indicate otherwise, you may remove your bandages 24-48 hours after surgery, and you may shower at that time.  You may have steri-strips (small skin tapes) in place directly over the incision.  These strips should be left on the skin for 7-10 days.  If your surgeon used skin glue on the incision, you may shower in 24 hours.  The glue will flake off over the next 2-3 weeks.  Any  sutures or staples will be removed at the office during your follow-up visit. ACTIVITIES:  You may resume regular daily activities (gradually increasing) beginning the next day.  Wearing a good support bra or sports bra minimizes pain and swelling.  You may have sexual intercourse when it is comfortable. You may drive when you no longer are taking prescription pain medication, you can comfortably wear a seatbelt, and you can safely maneuver your car and apply brakes. RETURN TO WORK:  ______________________________________________________________________________________ You should see your doctor in the office for a follow-up appointment approximately two weeks after your surgery.  Your doctor's nurse will typically make your follow-up appointment when she calls you with your pathology report.  Expect your pathology report 2-3 business days after your surgery.  You may call to check if you do not hear from us after three days. OTHER INSTRUCTIONS: _______________________________________________________________________________________________ _____________________________________________________________________________________________________________________________________ _____________________________________________________________________________________________________________________________________ _____________________________________________________________________________________________________________________________________  WHEN TO CALL YOUR DOCTOR: Fever over 101.0 Nausea and/or vomiting. Extreme swelling or bruising. Continued bleeding from incision. Increased pain, redness, or drainage from the incision.  The clinic staff is available to answer your questions during regular business hours.  Please don't hesitate to call and ask to speak to one of the nurses for clinical concerns.  If you have a medical emergency, go to the nearest emergency room or call 911.  A surgeon from [facility] is always on call at the hospital.  For further questions, please visit [email or web address]   

## 2022-05-03 ENCOUNTER — Ambulatory Visit: Payer: Medicare Other | Admitting: Internal Medicine

## 2022-05-03 ENCOUNTER — Encounter (HOSPITAL_COMMUNITY): Payer: Self-pay | Admitting: Surgery

## 2022-05-05 LAB — SURGICAL PATHOLOGY

## 2022-05-08 ENCOUNTER — Inpatient Hospital Stay (HOSPITAL_BASED_OUTPATIENT_CLINIC_OR_DEPARTMENT_OTHER): Payer: Medicare Other | Admitting: Hematology & Oncology

## 2022-05-08 ENCOUNTER — Encounter: Payer: Self-pay | Admitting: Hematology & Oncology

## 2022-05-08 ENCOUNTER — Inpatient Hospital Stay: Payer: Medicare Other | Attending: Hematology & Oncology

## 2022-05-08 VITALS — BP 156/59 | HR 57 | Temp 98.0°F | Resp 18 | Wt 169.8 lb

## 2022-05-08 DIAGNOSIS — D0512 Intraductal carcinoma in situ of left breast: Secondary | ICD-10-CM

## 2022-05-08 DIAGNOSIS — C9110 Chronic lymphocytic leukemia of B-cell type not having achieved remission: Secondary | ICD-10-CM | POA: Diagnosis not present

## 2022-05-08 DIAGNOSIS — Z85038 Personal history of other malignant neoplasm of large intestine: Secondary | ICD-10-CM | POA: Insufficient documentation

## 2022-05-08 DIAGNOSIS — C179 Malignant neoplasm of small intestine, unspecified: Secondary | ICD-10-CM

## 2022-05-08 DIAGNOSIS — D509 Iron deficiency anemia, unspecified: Secondary | ICD-10-CM | POA: Insufficient documentation

## 2022-05-08 DIAGNOSIS — Z79899 Other long term (current) drug therapy: Secondary | ICD-10-CM | POA: Diagnosis not present

## 2022-05-08 LAB — CBC WITH DIFFERENTIAL (CANCER CENTER ONLY)
Abs Immature Granulocytes: 0.02 10*3/uL (ref 0.00–0.07)
Basophils Absolute: 0 10*3/uL (ref 0.0–0.1)
Basophils Relative: 0 %
Eosinophils Absolute: 0.1 10*3/uL (ref 0.0–0.5)
Eosinophils Relative: 1 %
HCT: 34.3 % — ABNORMAL LOW (ref 36.0–46.0)
Hemoglobin: 11.4 g/dL — ABNORMAL LOW (ref 12.0–15.0)
Immature Granulocytes: 0 %
Lymphocytes Relative: 22 %
Lymphs Abs: 1.5 10*3/uL (ref 0.7–4.0)
MCH: 32.5 pg (ref 26.0–34.0)
MCHC: 33.2 g/dL (ref 30.0–36.0)
MCV: 97.7 fL (ref 80.0–100.0)
Monocytes Absolute: 0.8 10*3/uL (ref 0.1–1.0)
Monocytes Relative: 12 %
Neutro Abs: 4.5 10*3/uL (ref 1.7–7.7)
Neutrophils Relative %: 65 %
Platelet Count: 177 10*3/uL (ref 150–400)
RBC: 3.51 MIL/uL — ABNORMAL LOW (ref 3.87–5.11)
RDW: 13 % (ref 11.5–15.5)
WBC Count: 6.9 10*3/uL (ref 4.0–10.5)
nRBC: 0 % (ref 0.0–0.2)

## 2022-05-08 LAB — CMP (CANCER CENTER ONLY)
ALT: 15 U/L (ref 0–44)
AST: 17 U/L (ref 15–41)
Albumin: 4 g/dL (ref 3.5–5.0)
Alkaline Phosphatase: 57 U/L (ref 38–126)
Anion gap: 10 (ref 5–15)
BUN: 17 mg/dL (ref 8–23)
CO2: 30 mmol/L (ref 22–32)
Calcium: 9.9 mg/dL (ref 8.9–10.3)
Chloride: 105 mmol/L (ref 98–111)
Creatinine: 1.31 mg/dL — ABNORMAL HIGH (ref 0.44–1.00)
GFR, Estimated: 41 mL/min — ABNORMAL LOW (ref >60)
Glucose, Bld: 131 mg/dL — ABNORMAL HIGH (ref 70–99)
Potassium: 4.6 mmol/L (ref 3.5–5.1)
Sodium: 145 mmol/L (ref 135–145)
Total Bilirubin: 0.6 mg/dL (ref 0.3–1.2)
Total Protein: 6.7 g/dL (ref 6.5–8.1)

## 2022-05-08 LAB — IRON AND IRON BINDING CAPACITY (CC-WL,HP ONLY)
Iron: 88 ug/dL (ref 28–170)
Saturation Ratios: 29 % (ref 10.4–31.8)
TIBC: 307 ug/dL (ref 250–450)
UIBC: 219 ug/dL (ref 148–442)

## 2022-05-08 LAB — LACTATE DEHYDROGENASE: LDH: 167 U/L (ref 98–192)

## 2022-05-08 LAB — SAVE SMEAR(SSMR), FOR PROVIDER SLIDE REVIEW

## 2022-05-08 LAB — FERRITIN: Ferritin: 98 ng/mL (ref 11–307)

## 2022-05-08 NOTE — Progress Notes (Signed)
Hematology and Oncology Follow Up Visit  [name] [id number] [birth year] 82 y.o. 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  She did undergo a lumpectomy.  This was done on 05/02/2022.  The pathology report ([id number]) showed DCIS that was greater than 8 mm.  There was a positive margin.  Also noted were some intraductal papillomas.  There was some atypical lobular hyperplasia.  This was nuclear grade 3 with necrosis.  I do believe that given that she had a positive margin, that she may need to have another surgery to help remove the margin.  However, given her age, I do not know if she would just be okay with having radiation therapy.  This appears to be somewhat aggressive with the high nuclear grade and necrosis.  I will have to speak to her surgeon about this.  As far as the CLL is concerned, this really is not a problem for his right now.  She is on venetoclax.  I think she is done well on the venetoclax by itself.  She has had no problems with bowels or bladder.  She has had no problems with nausea or vomiting.  There is been no leg swelling.  She has had no fever.  There is been no rashes.  She has had no headache.  Overall, I would have said that her performance status is probably ECOG 1.   Medications:  Allergies as of 05/08/2022       Reactions   Codeine Other (See Comments)   REACTION: makes her nervous, orTylenol #3   Ibuprofen Other (See Comments)   REACTION: nervous   Meperidine Hcl Nausea And Vomiting    Naproxen Sodium Other (See Comments)   Or Advil REACTION: nervous   Tramadol Other (See Comments)   Insomnia    Tylenol With Codeine #3 [acetaminophen-codeine] Nausea And Vomiting        Medication List        Accurate as of May 08, 2022 12:44 PM. If you have any questions, ask your nurse or doctor.          acetaminophen 500 MG tablet Commonly known as: TYLENOL Take 1 tablet (500 mg total) by mouth every 6 (six) hours as needed.   amLODipine 5 MG tablet Commonly known as: NORVASC TAKE 1 TABLET(5 MG) BY MOUTH DAILY   aspirin 81 MG chewable tablet Chew 1 tablet (81 mg total) by mouth daily.   atenolol 100 MG tablet Commonly known as: TENORMIN TAKE 1 TABLET(100 MG) BY MOUTH DAILY   atorvastatin 20 MG tablet Commonly known as: LIPITOR TAKE 1 TABLET(20 MG) BY MOUTH DAILY   cholecalciferol 1000 units tablet Commonly known as: VITAMIN D Take 1,000 Units by mouth daily.   clopidogrel 75 MG tablet Commonly known as: PLAVIX Take 1 tablet (75 mg total) by mouth daily.   fluticasone 50 MCG/ACT nasal spray Commonly known as: FLONASE SHAKE LIQUID AND USE 2 SPRAYS IN EACH NOSTRIL DAILY   insulin NPH Human 100 UNIT/ML injection Commonly known as: NOVOLIN N Inject 24-36  Units into the skin See admin instructions. Inject 36 units into the skin every morning and inject 24 units into the skin at bedtime.   insulin regular 100 units/mL injection Commonly known as: NOVOLIN R Inject 10 Units into the skin 2 (two) times daily before a meal.   INSULIN SYRINGE .5CC/31GX5/16" 31G X 5/16" 0.5 ML Misc USE TO INJECT INSULIN 5 TIMES PER DAY   irbesartan 300 MG tablet Commonly known as: AVAPRO TAKE 1/2 TABLET(150 MG) BY MOUTH DAILY   isosorbide mononitrate 30 MG 24 hr tablet Commonly known as: IMDUR TAKE 1 TABLET BY MOUTH EVERY DAY   metFORMIN 500 MG tablet Commonly known as: GLUCOPHAGE TAKE 1 TABLET(500 MG) BY MOUTH DAILY WITH SUPPER   mometasone 0.1 % cream Commonly known  as: ELOCON Apply 1 Application topically daily as needed (itchy ears). Use as directed   multivitamin capsule Take 1 capsule by mouth daily.   omeprazole 20 MG capsule Commonly known as: PRILOSEC TAKE 1 CAPSULE(20 MG) BY MOUTH DAILY   ondansetron 8 MG tablet Commonly known as: ZOFRAN Take 1 tablet (8 mg total) by mouth every 8 (eight) hours as needed for nausea or vomiting.   ondansetron 4 MG tablet Commonly known as: Zofran Take 1 tablet (4 mg total) by mouth every 8 (eight) hours as needed for nausea or vomiting.   OneTouch Ultra test strip Generic drug: glucose blood USE AS DIRECTED THREE TIMES DAILY   oxyCODONE 5 MG immediate release tablet Commonly known as: Oxy IR/ROXICODONE Take 1 tablet (5 mg total) by mouth every 6 (six) hours as needed for severe pain.   Semaglutide(0.25 or 0.5MG /DOS) 2 MG/3ML Sopn Inject 0.25mg  weekly for two weeks then 0.5mg  weekly What changed:  how much to take how to take this when to take this additional instructions   Venclexta 100 MG tablet Generic drug: venetoclax Take 1 tablet (100 mg total) by mouth daily. Tablets should be swallowed whole with a meal and a full glass of water.        Allergies:  Allergies  Allergen Reactions   Codeine Other (See Comments)    REACTION: makes her nervous, orTylenol #3   Ibuprofen Other (See Comments)    REACTION: nervous   Meperidine Hcl Nausea And Vomiting   Naproxen Sodium Other (See Comments)    Or Advil  REACTION: nervous   Tramadol Other (See Comments)    Insomnia    Tylenol With Codeine #3 [Acetaminophen-Codeine] Nausea And Vomiting    Past Medical History, Surgical history, Social history, and Family History were reviewed and updated.  Review of Systems: Review of Systems  Constitutional: Negative.   HENT: Negative.    Eyes: Negative.   Respiratory: Negative.    Cardiovascular: Negative.   Gastrointestinal: Negative.   Genitourinary: Negative.   Musculoskeletal:  Negative.   Skin: Negative.   Neurological: Negative.   Endo/Heme/Allergies: Negative.   Psychiatric/Behavioral: Negative.       Physical Exam:  weight is 169 lb 12.8 oz (77 kg). Her oral temperature is 98 F (36.7 C). Her blood pressure is 156/59 (abnormal) and her pulse is 57 (abnormal). Her respiration is 18 and oxygen saturation is 100%.   Wt Readings from Last 3 Encounters:  05/08/22 169 lb 12.8 oz (77 kg)  05/02/22 171 lb 4.8 oz (77.7 kg)  04/26/22 171 lb 8 oz (77.8 kg)    Physical Exam Vitals reviewed.  Constitutional:      Comments: Her breast exam shows right breast no masses, edema  or erythema.  There is no right axillary adenopathy.  Left breast shows the lumpectomy scar at the rim of the areola.  This is at about the 12 o'clock position.  This is healing.  There is a little bit of ecchymoses.  There is a little bit of tenderness.  There is no erythema or swelling.  There is no left axillary adenopathy.  HENT:     Head: Normocephalic and atraumatic.  Eyes:     Pupils: Pupils are equal, round, and reactive to light.  Cardiovascular:     Rate and Rhythm: Normal rate and regular rhythm.     Heart sounds: Normal heart sounds.  Pulmonary:     Effort: Pulmonary effort is normal.     Breath sounds: Normal breath sounds.  Abdominal:     General: Bowel sounds are normal.     Palpations: Abdomen is soft.  Musculoskeletal:        General: No tenderness or deformity. Normal range of motion.     Cervical back: Normal range of motion.  Lymphadenopathy:     Cervical: No cervical adenopathy.  Skin:    General: Skin is warm and dry.     Findings: No erythema or rash.  Neurological:     Mental Status: She is alert and oriented to person, place, and time.  Psychiatric:        Behavior: Behavior normal.        Thought Content: Thought content normal.        Judgment: Judgment normal.      Lab Results  Component Value Date   WBC 6.9 05/08/2022   HGB 11.4 (L) 05/08/2022    HCT 34.3 (L) 05/08/2022   MCV 97.7 05/08/2022   PLT 177 05/08/2022   Lab Results  Component Value Date   FERRITIN 127 03/27/2022   IRON 86 03/27/2022   TIBC 314 03/27/2022   UIBC 228 03/27/2022   IRONPCTSAT 27 03/27/2022   Lab Results  Component Value Date   RETICCTPCT 1.9 08/24/2020   RBC 3.51 (L) 05/08/2022   Lab Results  Component Value Date   KPAFRELGTCHN 32.8 (H) 05/16/2021   LAMBDASER 26.4 (H) 05/16/2021   KAPLAMBRATIO 1.24 05/16/2021   Lab Results  Component Value Date   IGGSERUM 900 02/20/2022   IGA 181 02/20/2022   IGMSERUM 19 (L) 02/20/2022   Lab Results  Component Value Date   TOTALPROTELP 6.5 11/18/2020   ALBUMINELP 3.6 11/18/2020   A1GS 0.3 11/18/2020   A2GS 0.9 11/18/2020   BETS 1.0 11/18/2020   BETA2SER 6.2 02/25/2014   GAMS 0.7 11/18/2020   MSPIKE Not Observed 11/18/2020   SPEI * 02/25/2014     Chemistry      Component Value Date/Time   NA 145 05/08/2022 1151   NA 147 (H) 11/03/2016 1018   NA 143 08/30/2016 0954   K 4.6 05/08/2022 1151   K 3.9 11/03/2016 1018   K 4.5 08/30/2016 0954   CL 105 05/08/2022 1151   CL 110 (H) 11/03/2016 1018   CO2 30 05/08/2022 1151   CO2 27 11/03/2016 1018   CO2 24 08/30/2016 0954   BUN 17 05/08/2022 1151   BUN 25 (H) 11/03/2016 1018   BUN 20.5 08/30/2016 0954   CREATININE 1.31 (H) 05/08/2022 1151   CREATININE 1.6 (H) 11/03/2016 1018   CREATININE 1.4 (H) 08/30/2016 0954      Component Value Date/Time   CALCIUM 9.9 05/08/2022 1151   CALCIUM 10.1 11/03/2016 1018   CALCIUM  9.8 08/30/2016 0954   ALKPHOS 57 05/08/2022 1151   ALKPHOS 112 (H) 11/03/2016 1018   ALKPHOS 113 08/30/2016 0954   AST 17 05/08/2022 1151   AST 23 08/30/2016 0954   ALT 15 05/08/2022 1151   ALT 29 11/03/2016 1018   ALT 20 08/30/2016 0954   BILITOT 0.6 05/08/2022 1151   BILITOT 0.38 08/30/2016 0954      Impression and Plan: [name] is a very pleasant 82 yo caucasian female with CLL.  So far, we have not had any negative  prognostic markers.  She does have the 13q-chromosomal abnormality.  This is typically construed as a good prognostic marker.  We now have a new problem.  This is the DCIS of the left breast.  Again, she has a positive margin.  I will have to see what the surgeon has to say about this.  If she needs antiestrogen therapy, I do not see a problem with her being on antiestrogen therapy.  Her CLL currently is not an issue.  She is in remission from this.  She had flow cytometry that was done on her peripheral blood back in March which showed less than 1% monoclonal B cells.  I would just keep her on the venetoclax for right now.  I would like to see her back in 2 months.  Again, I am sure that Dr. Luisa Hart of Surgery will see her sooner regarding the DCIS and whether or not she needs any further therapy for this.     Josph Macho, MD 5/6/202412:44 PM

## 2022-05-10 ENCOUNTER — Ambulatory Visit: Payer: Self-pay | Admitting: Surgery

## 2022-05-10 ENCOUNTER — Other Ambulatory Visit (HOSPITAL_COMMUNITY): Payer: Self-pay

## 2022-05-11 ENCOUNTER — Other Ambulatory Visit (INDEPENDENT_AMBULATORY_CARE_PROVIDER_SITE_OTHER): Payer: Medicare Other

## 2022-05-11 DIAGNOSIS — E1165 Type 2 diabetes mellitus with hyperglycemia: Secondary | ICD-10-CM

## 2022-05-11 DIAGNOSIS — Z794 Long term (current) use of insulin: Secondary | ICD-10-CM

## 2022-05-11 LAB — BASIC METABOLIC PANEL
BUN: 24 mg/dL — ABNORMAL HIGH (ref 6–23)
CO2: 29 mEq/L (ref 19–32)
Calcium: 9.6 mg/dL (ref 8.4–10.5)
Chloride: 108 mEq/L (ref 96–112)
Creatinine, Ser: 1.44 mg/dL — ABNORMAL HIGH (ref 0.40–1.20)
GFR: 33.83 mL/min — ABNORMAL LOW (ref >60.00)
Glucose, Bld: 73 mg/dL (ref 70–99)
Potassium: 4.3 mEq/L (ref 3.5–5.1)
Sodium: 144 mEq/L (ref 135–145)

## 2022-05-11 LAB — MICROALBUMIN / CREATININE URINE RATIO
Creatinine,U: 98.5 mg/dL
Microalb Creat Ratio: 14.5 mg/g (ref 0.0–30.0)
Microalb, Ur: 14.3 mg/dL — ABNORMAL HIGH (ref 0.0–1.9)

## 2022-05-12 ENCOUNTER — Encounter (HOSPITAL_BASED_OUTPATIENT_CLINIC_OR_DEPARTMENT_OTHER): Payer: Self-pay | Admitting: Surgery

## 2022-05-12 ENCOUNTER — Other Ambulatory Visit: Payer: Self-pay

## 2022-05-12 DIAGNOSIS — H35432 Paving stone degeneration of retina, left eye: Secondary | ICD-10-CM | POA: Diagnosis not present

## 2022-05-12 DIAGNOSIS — H43813 Vitreous degeneration, bilateral: Secondary | ICD-10-CM | POA: Diagnosis not present

## 2022-05-12 DIAGNOSIS — E113311 Type 2 diabetes mellitus with moderate nonproliferative diabetic retinopathy with macular edema, right eye: Secondary | ICD-10-CM | POA: Diagnosis not present

## 2022-05-12 DIAGNOSIS — Q141 Congenital malformation of retina: Secondary | ICD-10-CM | POA: Diagnosis not present

## 2022-05-12 DIAGNOSIS — E113312 Type 2 diabetes mellitus with moderate nonproliferative diabetic retinopathy with macular edema, left eye: Secondary | ICD-10-CM | POA: Diagnosis not present

## 2022-05-12 DIAGNOSIS — H3509 Other intraretinal microvascular abnormalities: Secondary | ICD-10-CM | POA: Diagnosis not present

## 2022-05-12 LAB — FRUCTOSAMINE: Fructosamine: 205 umol/L (ref 0–285)

## 2022-05-12 LAB — HM DIABETES EYE EXAM

## 2022-05-12 MED ORDER — CHLORHEXIDINE GLUCONATE CLOTH 2 % EX PADS: 6.0000 | MEDICATED_PAD | Freq: Once | CUTANEOUS | Status: DC

## 2022-05-12 NOTE — Progress Notes (Signed)

## 2022-05-16 ENCOUNTER — Ambulatory Visit (INDEPENDENT_AMBULATORY_CARE_PROVIDER_SITE_OTHER): Payer: Medicare Other | Admitting: Endocrinology

## 2022-05-16 ENCOUNTER — Encounter: Payer: Self-pay | Admitting: Endocrinology

## 2022-05-16 VITALS — BP 120/64 | HR 60 | Ht 66.0 in | Wt 172.2 lb

## 2022-05-16 DIAGNOSIS — Z794 Long term (current) use of insulin: Secondary | ICD-10-CM

## 2022-05-16 DIAGNOSIS — E1165 Type 2 diabetes mellitus with hyperglycemia: Secondary | ICD-10-CM | POA: Diagnosis not present

## 2022-05-16 DIAGNOSIS — I10 Essential (primary) hypertension: Secondary | ICD-10-CM

## 2022-05-16 NOTE — Progress Notes (Signed)
Patient ID: [name], female   DOB: November 24, 1940, 82 y.o.   MRN: [id number]            Reason for Appointment: Followup    History of Present Illness:          Diagnosis: Type 2 diabetes mellitus, date of diagnosis: 1998       Past history:   She was treated with metformin at diagnosis when this had been continued until 2015 Subsequently Amaryl was also added several years ago and this has been continued Her blood sugars were under fair control between 2010 and early 2013 with A1c ranging from 7.4-9.4, mostly under 8% However since 08/2011 her A1c has been mostly over 8%  Insulin was added in 2014 with small doses of Lantus and this has been progressively increased She was started on mealtime insulin on her initial consultation in 9/15 because of high postprandial readings, sometimes over 300 With adding Victoza in 02/2014 her blood sugars were somewhat better with A1c coming down below 8%  Recent history:   INSULIN regimen: Regular 10 units a.m. and 10  units before dinner.  NPH 36 units in the morning and 24 hs  Non-insulin hypoglycemic drugs the patient is taking are:    Current blood sugar patterns, daily management and problems identified:  Her A1c is again lower than expected at 5.2 likely because of anemia Fructosamine is still relatively low at 205  She has been checking her blood sugars again mostly in the mornings before her first meal which is usually 11 AM and only a couple readings at night She has the freestyle libre sensor but she did not get scheduled for training for unknown reasons.  She is also concerned that she may have to cover it while taking a shower as well as possible frequent alerts No hypoglycemia She has been on Ozempic instead of Victoza because of insurance preference However by mistake initially when starting that she took this daily for a few days then left it off for some time In the last month or so she has been told to hold this for 2  weeks around the time of her surgery and is still not back on it Diet has not changed She did not change her insulin doses as recommended on the last visit      Dinner is usually at 6-7 pm Bfst 10 am (yogurt)  Glucose monitoring:  done 1-2 times a day         Glucometer: One Touch ultra 2.       Blood Glucose readings by time of day from meter review   PRE-MEAL Fasting Lunch Dinner Bedtime Overall  Glucose range: 83-173   149   Mean/median: 122    128   POST-MEAL PC Breakfast PC Lunch PC Dinner  Glucose range:   177  Mean/median:       Previous range 87-121 with 30-day average 104   Self-care: The diet that the patient has been following is: none, usually eating low fat meals Meals: 2-3 meals per day          Dietician visit, most recent: never.  She saw the CDE in 02/2014              Weight history:  Wt Readings from Last 3 Encounters:  05/16/22 172 lb 3.2 oz (78.1 kg)  05/08/22 169 lb 12.8 oz (77 kg)  05/02/22 171 lb 4.8 oz (77.7 kg)    Glycemic control:  Lab Results  Component Value Date   HGBA1C 5.2 04/26/2022   HGBA1C 4.6 02/15/2022   HGBA1C 5.2 10/25/2021   Lab Results  Component Value Date   MICROALBUR 14.3 (H) 05/11/2022   LDLCALC 41 05/05/2020   CREATININE 1.44 (H) 05/11/2022   Lab Results  Component Value Date   HGB 11.4 (L) 05/08/2022   Lab Results  Component Value Date   FRUCTOSAMINE 205 05/11/2022   FRUCTOSAMINE 207 10/25/2021   FRUCTOSAMINE 230 06/28/2021     OTHER active problems  discussed in review of systems   Allergies as of 05/16/2022       Reactions   Codeine Other (See Comments)   REACTION: makes her nervous, orTylenol #3   Ibuprofen Other (See Comments)   REACTION: nervous   Meperidine Hcl Nausea And Vomiting   Naproxen Sodium Other (See Comments)   Or Advil REACTION: nervous   Tramadol Other (See Comments)   Insomnia    Tylenol With Codeine #3 [acetaminophen-codeine] Nausea And Vomiting        Medication List         Accurate as of May 16, 2022 11:50 AM. If you have any questions, ask your nurse or doctor.          acetaminophen 500 MG tablet Commonly known as: TYLENOL Take 1 tablet (500 mg total) by mouth every 6 (six) hours as needed.   amLODipine 5 MG tablet Commonly known as: NORVASC TAKE 1 TABLET(5 MG) BY MOUTH DAILY   aspirin 81 MG chewable tablet Chew 1 tablet (81 mg total) by mouth daily.   atenolol 100 MG tablet Commonly known as: TENORMIN TAKE 1 TABLET(100 MG) BY MOUTH DAILY   atorvastatin 20 MG tablet Commonly known as: LIPITOR TAKE 1 TABLET(20 MG) BY MOUTH DAILY   cholecalciferol 1000 units tablet Commonly known as: VITAMIN D Take 1,000 Units by mouth daily.   clopidogrel 75 MG tablet Commonly known as: PLAVIX Take 1 tablet (75 mg total) by mouth daily.   fluticasone 50 MCG/ACT nasal spray Commonly known as: FLONASE SHAKE LIQUID AND USE 2 SPRAYS IN EACH NOSTRIL DAILY   insulin NPH Human 100 UNIT/ML injection Commonly known as: NOVOLIN N Inject 24-36 Units into the skin See admin instructions. Inject 36 units into the skin every morning and inject 24 units into the skin at bedtime.   insulin regular 100 units/mL injection Commonly known as: NOVOLIN R Inject 10 Units into the skin 2 (two) times daily before a meal.   INSULIN SYRINGE .5CC/31GX5/16" 31G X 5/16" 0.5 ML Misc USE TO INJECT INSULIN 5 TIMES PER DAY   irbesartan 300 MG tablet Commonly known as: AVAPRO TAKE 1/2 TABLET(150 MG) BY MOUTH DAILY   isosorbide mononitrate 30 MG 24 hr tablet Commonly known as: IMDUR TAKE 1 TABLET BY MOUTH EVERY DAY   metFORMIN 500 MG tablet Commonly known as: GLUCOPHAGE TAKE 1 TABLET(500 MG) BY MOUTH DAILY WITH SUPPER   mometasone 0.1 % cream Commonly known as: ELOCON Apply 1 Application topically daily as needed (itchy ears). Use as directed   multivitamin capsule Take 1 capsule by mouth daily.   omeprazole 20 MG capsule Commonly known as: PRILOSEC TAKE 1  CAPSULE(20 MG) BY MOUTH DAILY   ondansetron 8 MG tablet Commonly known as: ZOFRAN Take 1 tablet (8 mg total) by mouth every 8 (eight) hours as needed for nausea or vomiting.   ondansetron 4 MG tablet Commonly known as: Zofran Take 1 tablet (4 mg total) by mouth every 8 (  eight) hours as needed for nausea or vomiting.   OneTouch Ultra test strip Generic drug: glucose blood USE AS DIRECTED THREE TIMES DAILY   oxyCODONE 5 MG immediate release tablet Commonly known as: Oxy IR/ROXICODONE Take 1 tablet (5 mg total) by mouth every 6 (six) hours as needed for severe pain.   Semaglutide(0.25 or 0.5MG /DOS) 2 MG/3ML Sopn Inject 0.25mg  weekly for two weeks then 0.5mg  weekly What changed:  how much to take how to take this when to take this additional instructions   Venclexta 100 MG tablet Generic drug: venetoclax Take 1 tablet (100 mg total) by mouth daily. Tablets should be swallowed whole with a meal and a full glass of water.        Allergies:  Allergies  Allergen Reactions   Codeine Other (See Comments)    REACTION: makes her nervous, orTylenol #3   Ibuprofen Other (See Comments)    REACTION: nervous   Meperidine Hcl Nausea And Vomiting   Naproxen Sodium Other (See Comments)    Or Advil  REACTION: nervous   Tramadol Other (See Comments)    Insomnia    Tylenol With Codeine #3 [Acetaminophen-Codeine] Nausea And Vomiting    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   Gastritis    GERD (gastroesophageal reflux disease)    Headache(784.0)     History of blood transfusion    "I"ve had 20 some; thru birth of children, loss of blood, last 2 were in ~ 1999 before my cancer surgery" (09/16/2015)   History of hiatal hernia    History of shingles    Hx of colonic polyps    Hyperlipidemia    Hypertension    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HCC) 2000   Myocardial infarction (HCC)    1997   Pneumonia    X 1   Postoperative hematoma involving circulatory system following cardiac catheterization 09/17/2015   S/P cardiac cath 09/16/15 09/17/2015   Type II diabetes mellitus (HCC)    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[facility])    as a child   Venous insufficiency     Past Surgical History:  Procedure Laterality Date   BREAST BIOPSY Left 03/10/2022   MM LT BREAST BX W LOC DEV 1ST LESION IMAGE BX SPEC STEREO GUIDE 03/10/2022 [facility]   BREAST BIOPSY  05/01/2022   MM LT RADIOACTIVE SEED LOC MAMMO GUIDE 05/01/2022 [facility]   BREAST LUMPECTOMY WITH RADIOACTIVE SEED LOCALIZATION Left 05/02/2022   Procedure: LEFT BREAST LUMPECTOMY WITH RADIOACTIVE SEED LOCALIZATION;  Surgeon: Harriette Bouillon, MD;  Location: [facility] OR;  Service: General;  Laterality: Left;   CARDIAC CATHETERIZATION  1997; 2008; 09/16/2015   CARDIAC CATHETERIZATION N/A 09/16/2015   Procedure: Right/Left Heart Cath and Coronary/Graft Angiography;  Surgeon: Runell Gess, MD;  Location: [facility] INVASIVE CV LAB;  Service: Cardiovascular;  Laterality: N/A;   CESAREAN SECTION  1966   COLONOSCOPY     CORONARY  ARTERY BYPASS GRAFT  1997   CABG X5   EYE SURGERY     2 laser surgeries on left eye with implant   EYE SURGERY Bilateral    cataracts   FEMUR IM NAIL Left 08/28/2018   Procedure: INTRAMEDULLARY (IM) NAIL FEMORAL;  Surgeon: Samson Frederic, MD;  Location: [facility] ORS;  Service: Orthopedics;  Laterality: Left;   FRACTURE SURGERY     HERNIA REPAIR     LAPAROSCOPIC ASSISTED VENTRAL HERNIA REPAIR  09/2008    with incarcerated colon;   Dr. 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 Samuella Cota   SHOULDER ARTHROSCOPY W/ ROTATOR CUFF REPAIR Right 1997   SMALL INTESTINE SURGERY     TONSILLECTOMY  1960s   TOTAL KNEE ARTHROPLASTY WITH HARDWARE REMOVAL Left 12/1994   (infex, hardware removed )- not replacement per patient   TUBAL LIGATION  1966    Family History  Problem Relation Age of Onset   Heart disease Mother 28   Heart disease Father 65       MI   Hypertension Child    Heart attack Child    Diabetes Child    Heart disease Maternal Aunt        x 2, all deceased   Heart disease Maternal Uncle        x 4, all deceased   Emphysema Brother 107   Colon cancer Neg Hx    Breast cancer Neg Hx    Rectal cancer Neg Hx    Stomach cancer Neg Hx     Social History:  reports that she has never smoked. She has never used smokeless tobacco. She reports that she does not drink alcohol and does not use drugs.    Review of Systems         Lipids: Has had excellent control of hypercholesterolemia with long-term use of Lipitor She has a history of coronary bypass surgery.  Last labs as follows:       Lab Results  Component Value Date   CHOL 120 06/22/2021   HDL 36.20 (L) 06/22/2021   LDLCALC 41 05/05/2020   LDLDIRECT 46.0 06/22/2021   TRIG 255.0 (H) 06/22/2021   CHOLHDL 3 06/22/2021                  The blood pressure has been treated with atenolol 100 mg by her PCP as also amlodipine  Has no home BP meter  BP Readings from Last 3 Encounters:  05/16/22 120/64  05/08/22 (!) 156/59  05/02/22 120/65     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 creatinine has been fluctuating  She does have a high microalbumin level in the past Normal in 12/22    Lab Results  Component Value Date   CREATININE 1.44 (H) 05/11/2022   CREATININE 1.31 (H) 05/08/2022    CREATININE 1.18 (H) 04/26/2022       No history of Numbness, tingling or burning in feet   Diabetic foot exam was in 6/22 showing mild neuropathy  History of CLL: Under the care of hematologist Getting infusions of iron for anemia  Lab Results  Component Value Date   WBC 6.9 05/08/2022   Lab Results  Component Value Date   HGB 11.4 (L) 05/08/2022  Physical Examination:  BP 120/64 (BP Location: Right Arm, Patient Position: Sitting, Cuff Size: Normal)   Pulse 60   Ht 5\' 6"  (1.676 m)   Wt 172 lb 3.2 oz (78.1 kg)   SpO2 97%   BMI 27.79 kg/m     Diabetes type 2, insulin requiring    See history of present illness for detailed discussion of her current blood sugar patterns, management and problems identified  Her A1c is falsely low at 5.2  As before fructosamine is lower at 205  A1c is falsely low because of anemia and renal function abnormality and difficult to assess objectively her level of control Also still has not used CGM and with only a few fasting blood sugar difficult to assess her control or adjust her insulin This was discussed in detail Otherwise no symptoms of hypoglycemia Overall blood sugar however appears to be well-controlled from available data  Discussed that because of her being insulin-dependent with multiple injections and need to avoid hypoglycemia especially overnight she needs to be on CGM which she already has Reassured her that this is easy to use and will not cause excessive alarms  HYPERTENSION: Her blood pressure is excellent but no orthostasis  Renal dysfunction: Creatinine slightly higher likely to be from relatively lower blood pressure readings from before   PLAN:  She will be scheduled for education about the freestyle libre sensor No change in insulin at this time However in the meantime she will start alternating fasting and 2-hour after meal blood sugars Discussed blood sugar targets Make sure she has some protein  with every meal and also bedtime snack  She will make a follow-up appointment with her PCP to discuss her blood pressure management and repeat renal functions, likely will need to reduce her amlodipine to 2.5 mg  Total visit time for evaluation and management and counseling = 30 minutes  There are no Patient Instructions on file for this visit.   Reather Littler 05/16/2022, 11:50 AM   Note: This office note was prepared with Dragon voice recognition system technology. Any transcriptional errors that result from this process are unintentional.

## 2022-05-16 NOTE — Patient Instructions (Signed)
Check blood sugars on waking up 4 days a week  Also check blood sugars about 2 hours after meals and do this after different meals by rotation  Recommended blood sugar levels on waking up are 90-130 and about 2 hours after meal is 130-160  Please bring your blood sugar monitor to each visit, thank you   

## 2022-05-18 ENCOUNTER — Encounter (HOSPITAL_BASED_OUTPATIENT_CLINIC_OR_DEPARTMENT_OTHER): Payer: Self-pay | Admitting: Surgery

## 2022-05-18 ENCOUNTER — Other Ambulatory Visit: Payer: Self-pay

## 2022-05-18 ENCOUNTER — Ambulatory Visit (HOSPITAL_BASED_OUTPATIENT_CLINIC_OR_DEPARTMENT_OTHER)
Admission: RE | Admit: 2022-05-18 | Discharge: 2022-05-18 | Disposition: A | Discharge status: Home / Self Care | Payer: Medicare Other | Attending: Surgery | Admitting: Surgery

## 2022-05-18 ENCOUNTER — Ambulatory Visit (HOSPITAL_BASED_OUTPATIENT_CLINIC_OR_DEPARTMENT_OTHER): Payer: Medicare Other | Admitting: Anesthesiology

## 2022-05-18 ENCOUNTER — Encounter (HOSPITAL_BASED_OUTPATIENT_CLINIC_OR_DEPARTMENT_OTHER)
Admission: RE | Disposition: A | Discharge status: Home / Self Care | Payer: Self-pay | Source: Home / Self Care | Attending: Surgery

## 2022-05-18 DIAGNOSIS — C50912 Malignant neoplasm of unspecified site of left female breast: Secondary | ICD-10-CM | POA: Diagnosis not present

## 2022-05-18 DIAGNOSIS — N6012 Diffuse cystic mastopathy of left breast: Secondary | ICD-10-CM | POA: Diagnosis not present

## 2022-05-18 DIAGNOSIS — D630 Anemia in neoplastic disease: Secondary | ICD-10-CM | POA: Diagnosis not present

## 2022-05-18 DIAGNOSIS — C9110 Chronic lymphocytic leukemia of B-cell type not having achieved remission: Secondary | ICD-10-CM | POA: Diagnosis not present

## 2022-05-18 DIAGNOSIS — Z79899 Other long term (current) drug therapy: Secondary | ICD-10-CM | POA: Diagnosis not present

## 2022-05-18 DIAGNOSIS — R920 Mammographic microcalcification found on diagnostic imaging of breast: Secondary | ICD-10-CM | POA: Insufficient documentation

## 2022-05-18 DIAGNOSIS — I10 Essential (primary) hypertension: Secondary | ICD-10-CM | POA: Insufficient documentation

## 2022-05-18 DIAGNOSIS — E119 Type 2 diabetes mellitus without complications: Secondary | ICD-10-CM | POA: Diagnosis not present

## 2022-05-18 DIAGNOSIS — Z7984 Long term (current) use of oral hypoglycemic drugs: Secondary | ICD-10-CM | POA: Diagnosis not present

## 2022-05-18 DIAGNOSIS — I25119 Atherosclerotic heart disease of native coronary artery with unspecified angina pectoris: Secondary | ICD-10-CM | POA: Diagnosis not present

## 2022-05-18 DIAGNOSIS — Z7969 Long term (current) use of other immunomodulators and immunosuppressants: Secondary | ICD-10-CM | POA: Diagnosis not present

## 2022-05-18 DIAGNOSIS — Z01818 Encounter for other preprocedural examination: Secondary | ICD-10-CM

## 2022-05-18 DIAGNOSIS — I2510 Atherosclerotic heart disease of native coronary artery without angina pectoris: Secondary | ICD-10-CM | POA: Insufficient documentation

## 2022-05-18 DIAGNOSIS — Z7902 Long term (current) use of antithrombotics/antiplatelets: Secondary | ICD-10-CM | POA: Insufficient documentation

## 2022-05-18 DIAGNOSIS — D0512 Intraductal carcinoma in situ of left breast: Secondary | ICD-10-CM | POA: Diagnosis not present

## 2022-05-18 DIAGNOSIS — Z794 Long term (current) use of insulin: Secondary | ICD-10-CM | POA: Insufficient documentation

## 2022-05-18 DIAGNOSIS — I252 Old myocardial infarction: Secondary | ICD-10-CM | POA: Diagnosis not present

## 2022-05-18 DIAGNOSIS — Z951 Presence of aortocoronary bypass graft: Secondary | ICD-10-CM | POA: Diagnosis not present

## 2022-05-18 DIAGNOSIS — Z7985 Long-term (current) use of injectable non-insulin antidiabetic drugs: Secondary | ICD-10-CM | POA: Insufficient documentation

## 2022-05-18 DIAGNOSIS — N6489 Other specified disorders of breast: Secondary | ICD-10-CM | POA: Diagnosis not present

## 2022-05-18 DIAGNOSIS — N6022 Fibroadenosis of left breast: Secondary | ICD-10-CM | POA: Insufficient documentation

## 2022-05-18 HISTORY — PX: RE-EXCISION OF BREAST LUMPECTOMY: SHX6048

## 2022-05-18 LAB — GLUCOSE, CAPILLARY
Glucose-Capillary: 83 mg/dL (ref 70–99)
Glucose-Capillary: 83 mg/dL (ref 70–99)

## 2022-05-18 SURGERY — EXCISION, LESION, BREAST
Anesthesia: General | Site: Breast | Laterality: Left

## 2022-05-18 MED ORDER — BUPIVACAINE-EPINEPHRINE (PF) 0.25% -1:200000 IJ SOLN
INTRAMUSCULAR | Status: AC
  Filled 2022-05-18: qty 30

## 2022-05-18 MED ORDER — FENTANYL CITRATE (PF) 100 MCG/2ML IJ SOLN
INTRAMUSCULAR | Status: DC | PRN
  Administered 2022-05-18 (×4): 25 ug via INTRAVENOUS

## 2022-05-18 MED ORDER — BUPIVACAINE-EPINEPHRINE 0.25% -1:200000 IJ SOLN
INTRAMUSCULAR | Status: DC | PRN
  Administered 2022-05-18: 20 mL

## 2022-05-18 MED ORDER — FENTANYL CITRATE (PF) 100 MCG/2ML IJ SOLN
25.0000 ug | INTRAMUSCULAR | Status: DC | PRN
  Administered 2022-05-18: 25 ug via INTRAVENOUS
  Administered 2022-05-18 (×2): 50 ug via INTRAVENOUS
  Administered 2022-05-18: 25 ug via INTRAVENOUS

## 2022-05-18 MED ORDER — LIDOCAINE 2% (20 MG/ML) 5 ML SYRINGE
INTRAMUSCULAR | Status: AC
  Filled 2022-05-18: qty 10

## 2022-05-18 MED ORDER — OXYCODONE HCL 5 MG PO TABS
ORAL_TABLET | ORAL | Status: AC
  Filled 2022-05-18: qty 1

## 2022-05-18 MED ORDER — CEFAZOLIN SODIUM-DEXTROSE 2-3 GM-%(50ML) IV SOLR
INTRAVENOUS | Status: DC | PRN
  Administered 2022-05-18: 2 g via INTRAVENOUS

## 2022-05-18 MED ORDER — PROPOFOL 10 MG/ML IV BOLUS
INTRAVENOUS | Status: DC | PRN
  Administered 2022-05-18: 130 mg via INTRAVENOUS

## 2022-05-18 MED ORDER — ONDANSETRON HCL 4 MG/2ML IJ SOLN
INTRAMUSCULAR | Status: DC | PRN
  Administered 2022-05-18: 4 mg via INTRAVENOUS

## 2022-05-18 MED ORDER — OXYCODONE HCL 5 MG PO TABS
5.0000 mg | ORAL_TABLET | Freq: Once | ORAL | Status: AC
  Administered 2022-05-18: 5 mg via ORAL

## 2022-05-18 MED ORDER — FENTANYL CITRATE (PF) 100 MCG/2ML IJ SOLN
INTRAMUSCULAR | Status: AC
  Filled 2022-05-18: qty 2

## 2022-05-18 MED ORDER — CEFAZOLIN SODIUM-DEXTROSE 2-4 GM/100ML-% IV SOLN
INTRAVENOUS | Status: AC
  Filled 2022-05-18: qty 100

## 2022-05-18 MED ORDER — SODIUM CHLORIDE 0.9 % IV SOLN
INTRAVENOUS | Status: AC
  Filled 2022-05-18: qty 10

## 2022-05-18 MED ORDER — DEXAMETHASONE SODIUM PHOSPHATE 10 MG/ML IJ SOLN
INTRAMUSCULAR | Status: DC | PRN
  Administered 2022-05-18: 5 mg via INTRAVENOUS

## 2022-05-18 MED ORDER — LIDOCAINE HCL (CARDIAC) PF 100 MG/5ML IV SOSY
PREFILLED_SYRINGE | INTRAVENOUS | Status: DC | PRN
  Administered 2022-05-18: 60 mg via INTRAVENOUS

## 2022-05-18 MED ORDER — SODIUM CHLORIDE 0.9 % IV SOLN
INTRAVENOUS | Status: DC | PRN
  Administered 2022-05-18: 1000 mL

## 2022-05-18 MED ORDER — ONDANSETRON HCL 4 MG/2ML IJ SOLN: 4.0000 mg | Freq: Once | INTRAMUSCULAR | Status: DC | PRN

## 2022-05-18 MED ORDER — ACETAMINOPHEN 500 MG PO TABS
1000.0000 mg | ORAL_TABLET | Freq: Once | ORAL | Status: AC
  Administered 2022-05-18: 1000 mg via ORAL

## 2022-05-18 MED ORDER — EPHEDRINE SULFATE (PRESSORS) 50 MG/ML IJ SOLN
INTRAMUSCULAR | Status: DC | PRN
  Administered 2022-05-18 (×4): 5 mg via INTRAVENOUS

## 2022-05-18 MED ORDER — ONDANSETRON HCL 4 MG/2ML IJ SOLN
INTRAMUSCULAR | Status: AC
  Filled 2022-05-18: qty 2

## 2022-05-18 MED ORDER — LACTATED RINGERS IV SOLN: INTRAVENOUS | Status: DC

## 2022-05-18 MED ORDER — CEFAZOLIN IN SODIUM CHLORIDE 3-0.9 GM/100ML-% IV SOLN: 3.0000 g | INTRAVENOUS | Status: DC

## 2022-05-18 MED ORDER — DEXAMETHASONE SODIUM PHOSPHATE 10 MG/ML IJ SOLN
INTRAMUSCULAR | Status: AC
  Filled 2022-05-18: qty 1

## 2022-05-18 MED ORDER — ACETAMINOPHEN 500 MG PO TABS
ORAL_TABLET | ORAL | Status: AC
  Filled 2022-05-18: qty 2

## 2022-05-18 SURGICAL SUPPLY — 47 items
ADH SKN CLS APL DERMABOND .7 (GAUZE/BANDAGES/DRESSINGS) ×1
APL PRP STRL LF DISP 70% ISPRP (MISCELLANEOUS) ×1
APPLIER CLIP 9.375 MED OPEN (MISCELLANEOUS)
APR CLP MED 9.3 20 MLT OPN (MISCELLANEOUS)
BINDER BREAST LRG (GAUZE/BANDAGES/DRESSINGS) IMPLANT
BINDER BREAST MEDIUM (GAUZE/BANDAGES/DRESSINGS) IMPLANT
BINDER BREAST XLRG (GAUZE/BANDAGES/DRESSINGS) IMPLANT
BINDER BREAST XXLRG (GAUZE/BANDAGES/DRESSINGS) IMPLANT
BLADE SURG 15 STRL LF DISP TIS (BLADE) ×1 IMPLANT
BLADE SURG 15 STRL SS (BLADE) ×1
CANISTER SUCT 1200ML W/VALVE (MISCELLANEOUS) ×1 IMPLANT
CHLORAPREP W/TINT 26 (MISCELLANEOUS) ×1 IMPLANT
CLIP APPLIE 9.375 MED OPEN (MISCELLANEOUS) IMPLANT
COVER BACK TABLE 60X90IN (DRAPES) ×1 IMPLANT
COVER MAYO STAND STRL (DRAPES) ×1 IMPLANT
DERMABOND ADVANCED .7 DNX12 (GAUZE/BANDAGES/DRESSINGS) ×1 IMPLANT
DRAPE LAPAROSCOPIC ABDOMINAL (DRAPES) IMPLANT
DRAPE LAPAROTOMY 100X72 PEDS (DRAPES) ×1 IMPLANT
DRAPE UTILITY XL STRL (DRAPES) ×1 IMPLANT
ELECT COATED BLADE 2.86 ST (ELECTRODE) ×1 IMPLANT
ELECT REM PT RETURN 9FT ADLT (ELECTROSURGICAL) ×1
ELECTRODE REM PT RTRN 9FT ADLT (ELECTROSURGICAL) ×1 IMPLANT
GLOVE BIOGEL PI IND STRL 8 (GLOVE) ×1 IMPLANT
GLOVE ECLIPSE 8.0 STRL XLNG CF (GLOVE) ×1 IMPLANT
GOWN STRL REUS W/ TWL LRG LVL3 (GOWN DISPOSABLE) ×2 IMPLANT
GOWN STRL REUS W/ TWL XL LVL3 (GOWN DISPOSABLE) ×1 IMPLANT
GOWN STRL REUS W/TWL LRG LVL3 (GOWN DISPOSABLE) ×1
GOWN STRL REUS W/TWL XL LVL3 (GOWN DISPOSABLE) ×1
HEMOSTAT ARISTA ABSORB 3G PWDR (HEMOSTASIS) IMPLANT
KIT MARKER MARGIN INK (KITS) IMPLANT
NDL HYPO 25X1 1.5 SAFETY (NEEDLE) ×1 IMPLANT
NEEDLE HYPO 25X1 1.5 SAFETY (NEEDLE) ×1 IMPLANT
NS IRRIG 1000ML POUR BTL (IV SOLUTION) ×1 IMPLANT
PACK BASIN DAY SURGERY FS (CUSTOM PROCEDURE TRAY) ×1 IMPLANT
PENCIL SMOKE EVACUATOR (MISCELLANEOUS) ×1 IMPLANT
SLEEVE SCD COMPRESS KNEE MED (STOCKING) ×1 IMPLANT
SPIKE FLUID TRANSFER (MISCELLANEOUS) ×1 IMPLANT
SPONGE T-LAP 4X18 ~~LOC~~+RFID (SPONGE) ×1 IMPLANT
STAPLER VISISTAT 35W (STAPLE) IMPLANT
SUT MON AB 4-0 PC3 18 (SUTURE) ×1 IMPLANT
SUT SILK 2 0 SH (SUTURE) IMPLANT
SUT VICRYL 3-0 CR8 SH (SUTURE) ×1 IMPLANT
SYR CONTROL 10ML LL (SYRINGE) ×1 IMPLANT
TOWEL GREEN STERILE FF (TOWEL DISPOSABLE) ×2 IMPLANT
TRAY FAXITRON CT DISP (TRAY / TRAY PROCEDURE) IMPLANT
TUBE CONNECTING 20X1/4 (TUBING) ×1 IMPLANT
YANKAUER SUCT BULB TIP NO VENT (SUCTIONS) ×1 IMPLANT

## 2022-05-18 NOTE — Interval H&P Note (Signed)
History and Physical Interval Note:  05/18/2022 1:16 PM  [name]  has presented today for surgery, with the diagnosis of BREAST CANCER.  The various methods of treatment have been discussed with the patient and family. After consideration of risks, benefits and other options for treatment, the patient has consented to  Procedure(s) with comments: RE-EXCISION OF LEFT BREAST LUMPECTOMY (Left) - 60 MIN ROOM 8 as a surgical intervention.  The patient's history has been reviewed, patient examined, no change in status, stable for surgery.  I have reviewed the patient's chart and labs.  Questions were answered to the patient's satisfaction.     Tu Shimmel A Al Bracewell

## 2022-05-18 NOTE — Anesthesia Preprocedure Evaluation (Addendum)
Anesthesia Evaluation  Patient identified by MRN, date of birth, ID band Patient awake    Reviewed: Allergy & Precautions, NPO status , Patient's Chart, lab work & pertinent test results, reviewed documented beta blocker date and time   Airway Mallampati: II  TM Distance: >3 FB Neck ROM: Full    Dental  (+) Dental Advisory Given, Edentulous Lower, Edentulous Upper   Pulmonary neg pulmonary ROS   Pulmonary exam normal breath sounds clear to auscultation       Cardiovascular hypertension, Pt. on medications and Pt. on home beta blockers + angina  + CAD and + Past MI  Normal cardiovascular exam Rhythm:Regular Rate:Normal     Neuro/Psych  Headaches PSYCHIATRIC DISORDERS Anxiety Depression       GI/Hepatic Neg liver ROS, hiatal hernia, PUD,GERD  Medicated,,UC   Endo/Other  diabetes, Type 2, Insulin Dependent, Oral Hypoglycemic Agents    Renal/GU Renal InsufficiencyRenal disease     Musculoskeletal  (+) Arthritis ,    Abdominal   Peds  Hematology  (+) Blood dyscrasia (Plavix), anemia CLL   Anesthesia Other Findings Day of surgery medications reviewed with the patient.  Breast cancer   Reproductive/Obstetrics                              Anesthesia Physical Anesthesia Plan  ASA: 3  Anesthesia Plan: General   Post-op Pain Management: Tylenol PO (pre-op)*   Induction: Intravenous  PONV Risk Score and Plan: 3 and Dexamethasone and Ondansetron  Airway Management Planned: LMA  Additional Equipment:   Intra-op Plan:   Post-operative Plan: Extubation in OR  Informed Consent: I have reviewed the patients History and Physical, chart, labs and discussed the procedure including the risks, benefits and alternatives for the proposed anesthesia with the patient or authorized representative who has indicated his/her understanding and acceptance.     Dental advisory given  Plan Discussed  with: CRNA  Anesthesia Plan Comments:        Anesthesia Quick Evaluation

## 2022-05-18 NOTE — Transfer of Care (Signed)
Immediate Anesthesia Transfer of Care Note  Patient: [name]  Procedure(s) Performed: RE-EXCISION OF LEFT BREAST LUMPECTOMY (Left: Breast)  Patient Location: PACU  Anesthesia Type:General  Level of Consciousness: awake, alert , and oriented  Airway & Oxygen Therapy: Patient Spontanous Breathing and Patient connected to face mask oxygen  Post-op Assessment: Report given to RN and Post -op Vital signs reviewed and stable  Post vital signs: Reviewed and stable  Last Vitals:  Vitals Value Taken Time  BP 153/79 05/18/22 1441  Temp 36.2 C 05/18/22 1441  Pulse 54 05/18/22 1443  Resp 17 05/18/22 1443  SpO2 100 % 05/18/22 1443  Vitals shown include unvalidated device data.  Last Pain:  Vitals:   05/18/22 1325  TempSrc: Temporal  PainSc: 0-No pain      Patients Stated Pain Goal: 3 (05/18/22 1325)  Complications: No notable events documented.

## 2022-05-18 NOTE — H&P (Signed)
[name] is an 82 y.o. female.   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   Gastritis    GERD (gastroesophageal reflux disease)    Headache(784.0)    History of blood transfusion    "I"ve had 20 some; thru birth of children, loss of blood, last 2 were in ~ 1999 before my cancer surgery" (09/16/2015)   History of hiatal hernia    History of shingles    Hx of colonic polyps    Hyperlipidemia    Hypertension    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HCC) 2000   Myocardial infarction (HCC)    1997   Pneumonia    X 1   Postoperative hematoma involving circulatory system following cardiac catheterization 09/17/2015   S/P cardiac cath 09/16/15 09/17/2015   Type II diabetes mellitus (HCC)    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[facility])    as a child   Venous insufficiency     Past Surgical History:  Procedure Laterality Date   BREAST BIOPSY Left 03/10/2022   MM LT BREAST BX W LOC DEV 1ST LESION IMAGE BX SPEC STEREO GUIDE 03/10/2022 [facility]   BREAST BIOPSY  05/01/2022   MM LT RADIOACTIVE SEED LOC MAMMO GUIDE 05/01/2022 [facility]   BREAST LUMPECTOMY WITH RADIOACTIVE SEED LOCALIZATION Left 05/02/2022   Procedure: LEFT BREAST LUMPECTOMY WITH RADIOACTIVE SEED LOCALIZATION;  Surgeon: Harriette Bouillon, MD;  Location: [facility] OR;  Service: General;  Laterality: Left;   CARDIAC CATHETERIZATION   1997; 2008; 09/16/2015   CARDIAC CATHETERIZATION N/A 09/16/2015   Procedure: Right/Left Heart Cath and Coronary/Graft Angiography;  Surgeon: Runell Gess, MD;  Location: [facility] INVASIVE CV LAB;  Service: Cardiovascular;  Laterality: N/A;   CESAREAN SECTION  1966   COLONOSCOPY     CORONARY ARTERY BYPASS GRAFT  1997   CABG X5   EYE SURGERY     2 laser surgeries on left eye with implant   EYE SURGERY Bilateral    cataracts   FEMUR IM NAIL Left 08/28/2018   Procedure: INTRAMEDULLARY (IM) NAIL FEMORAL;  Surgeon: Samson Frederic, MD;  Location: [facility];  Service: Orthopedics;  Laterality: Left;   FRACTURE SURGERY     HERNIA REPAIR     LAPAROSCOPIC ASSISTED VENTRAL HERNIA REPAIR  09/2008    with incarcerated colon;  Dr. Ezzard Standing   MOHS SURGERY  06/2016   [facility]   PATELLA FRACTURE SURGERY Left 11/1994   "crushed my knee"; put in a plate & 6 screws"   POLYPECTOMY     REFRACTIVE SURGERY Left 07/30/2015   resection of small bowel carcinoma  06/1998   Dr. Samuella Cota   SHOULDER ARTHROSCOPY W/ ROTATOR CUFF REPAIR Right 1997   SMALL INTESTINE SURGERY     TONSILLECTOMY  1960s   TOTAL KNEE ARTHROPLASTY WITH HARDWARE REMOVAL  Left 12/1994   (infex, hardware removed )- not replacement per patient   TUBAL LIGATION  1966    Family History  Problem Relation Age of Onset   Heart disease Mother 17   Heart disease Father 52       MI   Hypertension Child    Heart attack Child    Diabetes Child    Heart disease Maternal Aunt        x 2, all deceased   Heart disease Maternal Uncle        x 4, all deceased   Emphysema Brother 23   Colon cancer Neg Hx    Breast cancer Neg Hx    Rectal cancer Neg Hx    Stomach cancer Neg Hx    Social History:  reports that she has never smoked. She has never used smokeless tobacco. She reports that she does not drink alcohol and does not use drugs.  Allergies:  Allergies  Allergen Reactions   Codeine Other (See Comments)    REACTION: makes her nervous, orTylenol #3    Ibuprofen Other (See Comments)    REACTION: nervous   Meperidine Hcl Nausea And Vomiting   Naproxen Sodium Other (See Comments)    Or Advil  REACTION: nervous   Tramadol Other (See Comments)    Insomnia    Tylenol With Codeine #3 [Acetaminophen-Codeine] Nausea And Vomiting    Medications Prior to Admission  Medication Sig Dispense Refill   acetaminophen (TYLENOL) 500 MG tablet Take 1 tablet (500 mg total) by mouth every 6 (six) hours as needed. 30 tablet 0   amLODipine (NORVASC) 5 MG tablet TAKE 1 TABLET(5 MG) BY MOUTH DAILY 90 tablet 1   aspirin 81 MG chewable tablet Chew 1 tablet (81 mg total) by mouth daily.     atenolol (TENORMIN) 100 MG tablet TAKE 1 TABLET(100 MG) BY MOUTH DAILY 90 tablet 1   atorvastatin (LIPITOR) 20 MG tablet TAKE 1 TABLET(20 MG) BY MOUTH DAILY 90 tablet 3   cholecalciferol (VITAMIN D) 1000 UNITS tablet Take 1,000 Units by mouth daily.     clopidogrel (PLAVIX) 75 MG tablet Take 1 tablet (75 mg total) by mouth daily. 90 tablet 1   fluticasone (FLONASE) 50 MCG/ACT nasal spray SHAKE LIQUID AND USE 2 SPRAYS IN EACH NOSTRIL DAILY 48 g 3   glucose blood (ONETOUCH ULTRA) test strip USE AS DIRECTED THREE TIMES DAILY 100 strip 5   insulin regular (NOVOLIN R,HUMULIN R) 100 units/mL injection Inject 10 Units into the skin 2 (two) times daily before a meal.     irbesartan (AVAPRO) 300 MG tablet TAKE 1/2 TABLET(150 MG) BY MOUTH DAILY 45 tablet 3   isosorbide mononitrate (IMDUR) 30 MG 24 hr tablet TAKE 1 TABLET BY MOUTH EVERY DAY 90 tablet 3   metFORMIN (GLUCOPHAGE) 500 MG tablet TAKE 1 TABLET(500 MG) BY MOUTH DAILY WITH SUPPER 90 tablet 1   mometasone (ELOCON) 0.1 % cream Apply 1 Application topically daily as needed (itchy ears). Use as directed     Multiple Vitamin (MULTIVITAMIN) capsule Take 1 capsule by mouth daily.     omeprazole (PRILOSEC) 20 MG capsule TAKE 1 CAPSULE(20 MG) BY MOUTH DAILY 90 capsule 3   ondansetron (ZOFRAN) 4 MG tablet Take 1 tablet (4 mg total) by  mouth every 8 (eight) hours as needed for nausea or vomiting. 20 tablet 0   oxyCODONE (OXY IR/ROXICODONE) 5 MG immediate release tablet Take 1 tablet (5 mg total) by mouth every 6 (six) hours  as needed for severe pain. 15 tablet 0   insulin NPH Human (HUMULIN N,NOVOLIN N) 100 UNIT/ML injection Inject 24-36 Units into the skin See admin instructions. Inject 36 units into the skin every morning and inject 24 units into the skin at bedtime.     Insulin Syringe-Needle U-100 (INSULIN SYRINGE .5CC/31GX5/16") 31G X 5/16" 0.5 ML MISC USE TO INJECT INSULIN 5 TIMES PER DAY 200 each 0   ondansetron (ZOFRAN) 8 MG tablet Take 1 tablet (8 mg total) by mouth every 8 (eight) hours as needed for nausea or vomiting. 30 tablet 3   Semaglutide,0.25 or 0.5MG /DOS, 2 MG/3ML SOPN Inject 0.25mg  weekly for two weeks then 0.5mg  weekly (Patient taking differently: Inject 0.5 mg into the skin every Monday.) 9 mL 0   venetoclax (VENCLEXTA) 100 MG tablet Take 1 tablet (100 mg total) by mouth daily. Tablets should be swallowed whole with a meal and a full glass of water. 28 tablet 3    No results found. However, due to the size of the patient record, not all encounters were searched. Please check Results Review for a complete set of results. No results found.  Review of Systems  All other systems reviewed and are negative.   Height 5\' 6"  (1.676 m), weight 77.1 kg. Physical Exam Cardiovascular:     Rate and Rhythm: Normal rate.  Pulmonary:     Effort: Pulmonary effort is normal.  Chest:       Comments: Left breast incision CDI  Neurological:     Mental Status: She is alert.      Assessment/Plan LEFT BREAST CANCER  RE EXCISION LEFT BREAST LUMPECTOMY   The procedure has been discussed with the patient. Alternatives to surgery have been discussed with the patient.  Risks of surgery include bleeding,  Infection,  Seroma formation, death,  and the need for further surgery.   The patient understands and wishes to  proceed.      Dortha Schwalbe, MD 05/18/2022, 1:14 PM

## 2022-05-18 NOTE — Op Note (Signed)
Preoperative diagnosis: Left breast DCIS  Postoperative diagnosis: Same  Procedure: Left breast lumpectomy reexcision  Surgeon: Harriette Bouillon, MD  Anesthesia: General with 0.25% Marcaine plain  EBL: Minimal  Specimen all margins of lumpectomy cavity excised and oriented with ink sent to pathology.  Drains: None  Indications for procedure the patient presents for reexcision left breast lumpectomy after previous lumpectomy for DCIS revealed a positive inferior margin.  We talked about excising all margins.  She was hoping that with a wider margin she might be able to forego radiation therapy.  I explained the rationale for excision of the inferior margin and she agreed to proceed.  Risks and benefits of surgery reviewed as well as outcomes.  Risk of bleeding, infection, wound complications, cosmetic deformity, pain, infection, anesthesia risk, the need for additional surgery discussed with the patient.  She agreed to proceed.  Description of procedure: The patient was met in the holding area and questions were answered.  Left breast was marked as the correct site.  She was then taken back to the operating room.  She is placed supine upon the operating table.  After induction of general anesthesia, left breast was prepped and draped in sterile fashion and timeout performed.  The previous incision was opened and the seroma evacuated.  We exposed the cavity.  All margins were excised and oriented with ink.  Hemostasis achieved with cautery.  Irrigation was then used.  I infiltrated the cavity walls with 0.25% Marcaine local anesthetic.  We then closed the cavity again with a deep layer 3-0 Vicryl.  4 Monocryl was used to close skin in a subcuticular fashion.  Dermabond was applied.  All counts found to be correct.  The patient was awoke extubated taken to recovery in satisfactory condition.

## 2022-05-18 NOTE — Anesthesia Procedure Notes (Signed)
Procedure Name: LMA Insertion Date/Time: 05/18/2022 1:41 PM  Performed by: Lauralyn Primes, CRNAPre-anesthesia Checklist: Patient identified, Emergency Drugs available, Suction available and Patient being monitored Patient Re-evaluated:Patient Re-evaluated prior to induction Oxygen Delivery Method: Circle system utilized Preoxygenation: Pre-oxygenation with 100% oxygen Induction Type: IV induction Ventilation: Mask ventilation without difficulty LMA: LMA inserted LMA Size: 4.0 Number of attempts: 1 Airway Equipment and Method: Bite block Placement Confirmation: positive ETCO2 Tube secured with: Tape Dental Injury: Teeth and Oropharynx as per pre-operative assessment

## 2022-05-18 NOTE — Discharge Instructions (Addendum)
[facility] Office Phone Number [phone]  BREAST BIOPSY/ PARTIAL MASTECTOMY: POST OP INSTRUCTIONS  Always review your discharge instruction sheet given to you by the facility where your surgery was performed.  IF YOU HAVE DISABILITY OR FAMILY LEAVE FORMS, YOU MUST BRING THEM TO THE OFFICE FOR PROCESSING.  DO NOT GIVE THEM TO YOUR DOCTOR.  A prescription for pain medication may be given to you upon discharge.  Take your pain medication as prescribed, if needed.  If narcotic pain medicine is not needed, then you may take acetaminophen (Tylenol) or ibuprofen (Advil) as needed. Take your usually prescribed medications unless otherwise directed If you need a refill on your pain medication, please contact your pharmacy.  They will contact our office to request authorization.  Prescriptions will not be filled after 5pm or on week-ends. You should eat very light the first 24 hours after surgery, such as soup, crackers, pudding, etc.  Resume your normal diet the day after surgery. Most patients will experience some swelling and bruising in the breast.  Ice packs and a good support bra will help.  Swelling and bruising can take several days to resolve.  It is common to experience some constipation if taking pain medication after surgery.  Increasing fluid intake and taking a stool softener will usually help or prevent this problem from occurring.  A mild laxative (Milk of Magnesia or Miralax) should be taken according to package directions if there are no bowel movements after 48 hours. Unless discharge instructions indicate otherwise, you may remove your bandages 24-48 hours after surgery, and you may shower at that time.  You may have steri-strips (small skin tapes) in place directly over the incision.  These strips should be left on the skin for 7-10 days.  If your surgeon used skin glue on the incision, you may shower in 24 hours.  The glue will flake off over the next 2-3 weeks.  Any  sutures or staples will be removed at the office during your follow-up visit. ACTIVITIES:  You may resume regular daily activities (gradually increasing) beginning the next day.  Wearing a good support bra or sports bra minimizes pain and swelling.  You may have sexual intercourse when it is comfortable. You may drive when you no longer are taking prescription pain medication, you can comfortably wear a seatbelt, and you can safely maneuver your car and apply brakes. RETURN TO WORK:  ______________________________________________________________________________________ You should see your doctor in the office for a follow-up appointment approximately two weeks after your surgery.  Your doctor's nurse will typically make your follow-up appointment when she calls you with your pathology report.  Expect your pathology report 2-3 business days after your surgery.  You may call to check if you do not hear from us after three days. OTHER INSTRUCTIONS: _______________________________________________________________________________________________ _____________________________________________________________________________________________________________________________________ _____________________________________________________________________________________________________________________________________ _____________________________________________________________________________________________________________________________________  WHEN TO CALL YOUR DOCTOR: Fever over 101.0 Nausea and/or vomiting. Extreme swelling or bruising. Continued bleeding from incision. Increased pain, redness, or drainage from the incision.  The clinic staff is available to answer your questions during regular business hours.  Please don't hesitate to call and ask to speak to one of the nurses for clinical concerns.  If you have a medical emergency, go to the nearest emergency room or call 911.  A surgeon from [facility] is always on call at the hospital.  For further questions, please visit [email or web address]    Post Anesthesia Home Care Instructions  Activity: Get plenty of rest for the remainder of   of the day. A responsible individual must stay with you for 24 hours following the procedure.  For the next 24 hours, DO NOT: -Drive a car -Advertising copywriter -Drink alcoholic beverages -Take any medication unless instructed by your physician -Make any legal decisions or sign important papers.  Meals: Start with liquid foods such as gelatin or soup. Progress to regular foods as tolerated. Avoid greasy, spicy, heavy foods. If nausea and/or vomiting occur, drink only clear liquids until the nausea and/or vomiting subsides. Call your physician if vomiting continues.  Special Instructions/Symptoms: Your throat may feel dry or sore from the anesthesia or the breathing tube placed in your throat during surgery. If this causes discomfort, gargle with warm salt water. The discomfort should disappear within 24 hours.  Tylenol can be taken after 7:20 pm if needed

## 2022-05-18 NOTE — Anesthesia Postprocedure Evaluation (Signed)
Anesthesia Post Note  Patient: Kniyah H Shelden  Procedure(s) Performed: RE-EXCISION OF LEFT BREAST LUMPECTOMY (Left: Breast)     Patient location during evaluation: PACU Anesthesia Type: General Level of consciousness: awake and alert Pain management: pain level controlled Vital Signs Assessment: post-procedure vital signs reviewed and stable Respiratory status: spontaneous breathing, nonlabored ventilation, respiratory function stable and patient connected to nasal cannula oxygen Cardiovascular status: blood pressure returned to baseline and stable Postop Assessment: no apparent nausea or vomiting Anesthetic complications: no   No notable events documented.  Last Vitals:  Vitals:   05/18/22 1545 05/18/22 1605  BP: (!) 148/73 (!) 150/70  Pulse: (!) 54 (!) 55  Resp: 15 16  Temp:  36.7 C  SpO2: 97% 98%    Last Pain:  Vitals:   05/18/22 1605  TempSrc:   PainSc: 3                  Collene Schlichter

## 2022-05-19 ENCOUNTER — Encounter (HOSPITAL_BASED_OUTPATIENT_CLINIC_OR_DEPARTMENT_OTHER): Payer: Self-pay | Admitting: Surgery

## 2022-05-23 ENCOUNTER — Other Ambulatory Visit (HOSPITAL_COMMUNITY): Payer: Self-pay

## 2022-05-24 ENCOUNTER — Encounter: Payer: Self-pay | Admitting: Family

## 2022-05-24 ENCOUNTER — Encounter: Payer: Medicare Other | Attending: Endocrinology | Admitting: Nutrition

## 2022-05-24 DIAGNOSIS — E119 Type 2 diabetes mellitus without complications: Secondary | ICD-10-CM | POA: Insufficient documentation

## 2022-05-24 NOTE — Patient Instructions (Signed)
Change sensor every 10 days Call Dexcom help line if questions or if sensor falls off. 

## 2022-05-24 NOTE — Progress Notes (Signed)
Patient was trained on the use of the Dexcom G7 sensor.  We discussed differences between sensor glucose and bood glucose readings, and when it will be necessary to do a blood glucose reading.  She reported good understanding of this.  We inserted a sensor into her left out arm and the readings will be going to her reader.  She does not use a cell phone, except for emergencies.  The reader was set with date/time and she was told to bring the reader with her to every visit.  She had no final questions.

## 2022-05-27 ENCOUNTER — Other Ambulatory Visit: Payer: Self-pay | Admitting: Endocrinology

## 2022-05-30 LAB — SURGICAL PATHOLOGY

## 2022-05-31 ENCOUNTER — Telehealth: Payer: Self-pay

## 2022-05-31 NOTE — Telephone Encounter (Signed)
Prolia VOB initiated via [email or web address]  Last Prolia inj: 01/13/22 Next Prolia inj DUE: 07/13/22

## 2022-06-02 NOTE — Telephone Encounter (Signed)
Pt ready for scheduling for PROLIA on or after : 07/13/22  Out-of-pocket cost due at time of visit: $0  Primary: MEDICARE Prolia co-insurance: 0% Admin fee co-insurance: 0%  Secondary: UHC MEDSUP Prolia co-insurance:  Admin fee co-insurance:   Medical Benefit Details: Date Benefits were checked: 05/31/22 Deductible: $240 met of $240 required/ Coinsurance: 0%/ Admin Fee: 0%  Prior Auth: N/A PA# Expiration Date:    Pharmacy benefit: Copay $--- If patient wants fill through the pharmacy benefit please send prescription to:  --- , and include estimated need by date in rx notes. Pharmacy will ship medication directly to the office.  Patient NOT eligible for Prolia Copay Card. Copay Card can make patient's cost as little as $25. Link to apply: [email or web address]://www.amgensupportplus.[email or web address]/copay  ** This summary of benefits is an estimation of the patient's out-of-pocket cost. Exact cost may very based on individual plan coverage.

## 2022-06-08 ENCOUNTER — Other Ambulatory Visit (HOSPITAL_COMMUNITY): Payer: Self-pay

## 2022-06-09 DIAGNOSIS — N611 Abscess of the breast and nipple: Secondary | ICD-10-CM | POA: Diagnosis not present

## 2022-06-12 ENCOUNTER — Other Ambulatory Visit: Payer: Self-pay

## 2022-06-12 ENCOUNTER — Other Ambulatory Visit (HOSPITAL_COMMUNITY): Payer: Self-pay

## 2022-06-12 ENCOUNTER — Telehealth: Payer: Self-pay | Admitting: Radiation Oncology

## 2022-06-12 ENCOUNTER — Encounter: Payer: Self-pay | Admitting: *Deleted

## 2022-06-12 DIAGNOSIS — D0512 Intraductal carcinoma in situ of left breast: Secondary | ICD-10-CM

## 2022-06-12 NOTE — Telephone Encounter (Signed)
Spoke to pt 6/10 in regard to referral from Dr. Myna Hidalgo for pt to be seen with Dr. Roselind Messier for FUN and CT SIM. Pt advised she is wanting delay at this time due to currently being treated for infection to surgery site of breast. Pt asked for delay in scheduling at this time; requested f/u call 6/19 after f/u with surgeon Cornett 6/18.

## 2022-06-12 NOTE — Progress Notes (Signed)
Patient is an established patient with CLL who has since received a new DCIS diagnosis. Patient has already completed surgery, and surgical revision for positive margin. She is currently treating a surgical infection.   Referral to radiation oncology placed per MD request. Referral to social work and nutrition placed per protocol.  Oncology Nurse Navigator Documentation     06/12/2022    8:00 AM  Oncology Nurse Navigator Flowsheets  Abnormal Finding Date 03/01/2022  Confirmed Diagnosis Date 03/10/2022  Diagnosis Status Confirmed Diagnosis Complete  Phase of Treatment Surgery  Surgery Actual Start Date: 05/02/2022  Navigator Follow Up Date: 07/07/2022  Navigator Follow Up Reason: Follow-up Appointment  Navigator Location [facility]  Navigator Encounter Type Appt/Treatment Plan Review  Treatment Initiated Date 05/02/2022  Patient Visit Type MedOnc  Treatment Phase Active Tx  Barriers/Navigation Needs No Barriers At This Time  Interventions Referrals  Acuity Level 1-No Barriers  Referrals Nutrition/dietician;Radiation Oncology;Social Work  Time Spent with Patient 30

## 2022-06-14 ENCOUNTER — Telehealth: Payer: Self-pay

## 2022-06-14 NOTE — Telephone Encounter (Signed)
CSW attempted to contact patient per new patient protocol.  Phoned home phone and mobile phone and was not able to leave a message on either line.  Will attempt at another time.

## 2022-06-15 ENCOUNTER — Ambulatory Visit: Payer: Medicare Other | Admitting: Dietician

## 2022-06-15 NOTE — Progress Notes (Signed)
Nutrition Assessment: Reached out to patient at home telephone number.    Reason for Assessment: Newly diagnosed DCIS -- LEFT breast   ASSESSMENT: Patient is a 82 year old female well known to Dr. Gustavo Lah team who was just diagnosed with DCIS -- LEFT breast and underwent lumpectomy with another surgery to excise margins last month.  She has PMHx that includes CLL, GERD, IBS, DM, CKD, HLD, DJD,CAD, IDA, and Bit B def.  She reports lives alone, checks blood sugars and las been running low lately.  She treats her lows with OJ or hard candy that she always carries.  No food allergies, no NIS other than occasional GERD which is usually well controlled with daily Prilosec.  She eats when she wants, usually at least 2 meal a day with some snacking.  Has meats in house, will cook steaks, han, burgers when people are coming over. Usual Intake: Breakfast: yogurt (regular Yoplait), breakfast bars.  Likes hot-dogs, doesn't like frozen dinners, likes sweet and sour chicken, and peanut butter  Dinner:  tonight planning new potatoes & cabbage (encourage adding some protein).  Snacks on blueberries, strawberry, cherries, banana sandwich, pimento cheese sandwich, loves cheese  Fluids: Diet coke, Dr. Reino Kent, doesn't like milk, Gatorade for nausea, doesn't like Gingerale.    Nutrition Focused Physical Exam: unable to perform NFPE   Medications: Vit D, MVI, metformin, Insulin   Labs: 05/08/22 Hgb 11.4   Anthropometrics: stable within  4# range past 6 months  Height: 66" Weight:  05/18/22  171.3# UBW: 168-172# this year BMI: 27.79   Estimated Energy Needs  Kcals: 2000-2300 Protein: 78-94 Fluid: 2.5 L   NUTRITION DIAGNOSIS: None at this time interventions below for educational purposes only  INTERVENTION:   Relayed that nutrition services are wrap around service provided at no charge and encouraged continued communication if experiencing any nutritional impact symptoms (NIS). Educated on  importance of adequate nourishment with calorie and protein energy intake with nutrient dense foods when possible to promote healing, maintain weight/strength and QOL.   Encouraged high protein foods with each feeding or snack. Discussed strategies for increasing her fluid intake  Contact information provided for her to write down while on phone.  No email, and she doesn't get text or carry cell phone much.  MONITORING, EVALUATION, GOAL: weight trends, nutrition impact symptoms, PO intake, labs   Next Visit: PRN at patient or provider request.  Gennaro Africa, RDN, LDN Registered Dietitian, [facility] Part Time Remote (Usual office hours: Tuesday-Thursday) Mobile: [phone]

## 2022-06-19 NOTE — Telephone Encounter (Signed)
Encounter has been created by PA team.

## 2022-06-20 ENCOUNTER — Telehealth: Payer: Self-pay | Admitting: Internal Medicine

## 2022-06-20 DIAGNOSIS — D0512 Intraductal carcinoma in situ of left breast: Secondary | ICD-10-CM | POA: Diagnosis not present

## 2022-06-20 NOTE — Telephone Encounter (Signed)
Left message on machine to call back to schedule.    Will owe $0 OOP and schedule on or after 07/15/22

## 2022-06-20 NOTE — Telephone Encounter (Signed)
Pt has an appt with Dr.Paz on 6/21 and wanted to verify she is due for prolia so she ca get this done at the same time. Please advise.

## 2022-06-20 NOTE — Telephone Encounter (Signed)
Patient scheduled for 07/18/22.

## 2022-06-20 NOTE — Telephone Encounter (Signed)
Pt scheduled  

## 2022-06-21 ENCOUNTER — Telehealth: Payer: Self-pay | Admitting: Radiation Oncology

## 2022-06-21 NOTE — Telephone Encounter (Addendum)
Spoke to pt who advised surgery site is still currently infected; she had f/u with Dr. Luisa Hart 6/18 and states that he had to re-open site to allow for more draining. Pt asked for me to f/u 7/8 to possibly schedule after her 7/5 f/u with Dr. Myna Hidalgo because she wants clarification as to why she is being referred for XRT. Pt was advised to contact us if needed.

## 2022-06-23 ENCOUNTER — Ambulatory Visit: Payer: Medicare Other | Admitting: Internal Medicine

## 2022-06-29 ENCOUNTER — Ambulatory Visit (INDEPENDENT_AMBULATORY_CARE_PROVIDER_SITE_OTHER): Payer: Medicare Other | Admitting: *Deleted

## 2022-06-29 DIAGNOSIS — Z0000 Encounter for general adult medical examination without abnormal findings: Secondary | ICD-10-CM | POA: Diagnosis not present

## 2022-06-29 NOTE — Progress Notes (Signed)
Subjective:   [name] is a 82 y.o. female who presents for Medicare Annual (Subsequent) preventive examination.  Visit Complete: Virtual  I connected with  [name] on 06/29/22 by a audio enabled telemedicine application and verified that I am speaking with the correct person using two identifiers.  Patient Location: Home  Provider Location: Office/Clinic  I discussed the limitations of evaluation and management by telemedicine. The patient expressed understanding and agreed to proceed.   Review of Systems     Cardiac Risk Factors include: advanced age (>36men, >64 women);diabetes mellitus;dyslipidemia;hypertension     Objective:    Today's Vitals   There is no height or weight on file to calculate BMI.     06/29/2022   11:26 AM 05/18/2022    1:14 PM 05/08/2022   12:22 PM 05/02/2022    7:53 AM 04/26/2022   10:26 AM 03/30/2022    3:03 PM 03/27/2022   11:29 AM  Advanced Directives  Does Patient Have a Medical Advance Directive? Yes Yes Yes Yes Yes Yes Yes  Type of Advance Directive Living will Living will Living will Living will Living will Healthcare Power of Attorney;Living will Healthcare Power of [place];Living will  Does patient want to make changes to medical advance directive? No - Patient declined No - Patient declined  No - Patient declined  No - Patient declined No - Patient declined  Copy of Healthcare Power of Attorney in Chart?  No - copy requested No - copy requested   No - copy requested   Would patient like information on creating a medical advance directive?  No - Patient declined No - Patient declined        Current Medications (verified) Outpatient Encounter Medications as of 06/29/2022  Medication Sig   acetaminophen (TYLENOL) 500 MG tablet Take 1 tablet (500 mg total) by mouth every 6 (six) hours as needed.   amLODipine (NORVASC) 5 MG tablet TAKE 1 TABLET(5 MG) BY MOUTH DAILY   aspirin 81 MG chewable tablet Chew 1 tablet (81 mg total)  by mouth daily.   atenolol (TENORMIN) 100 MG tablet TAKE 1 TABLET(100 MG) BY MOUTH DAILY   atorvastatin (LIPITOR) 20 MG tablet TAKE 1 TABLET(20 MG) BY MOUTH DAILY   cholecalciferol (VITAMIN D) 1000 UNITS tablet Take 1,000 Units by mouth daily.   clopidogrel (PLAVIX) 75 MG tablet Take 1 tablet (75 mg total) by mouth daily.   fluticasone (FLONASE) 50 MCG/ACT nasal spray SHAKE LIQUID AND USE 2 SPRAYS IN EACH NOSTRIL DAILY   glucose blood (ONETOUCH ULTRA) test strip USE AS DIRECTED THREE TIMES DAILY   insulin NPH Human (HUMULIN N,NOVOLIN N) 100 UNIT/ML injection Inject 24-36 Units into the skin See admin instructions. Inject 36 units into the skin every morning and inject 24 units into the skin at bedtime.   insulin regular (NOVOLIN R,HUMULIN R) 100 units/mL injection Inject 10 Units into the skin 2 (two) times daily before a meal.   Insulin Syringe-Needle U-100 (INSULIN SYRINGE .5CC/31GX5/16") 31G X 5/16" 0.5 ML MISC USE TO INJECT INSULIN 5 TIMES PER DAY   irbesartan (AVAPRO) 300 MG tablet TAKE 1/2 TABLET(150 MG) BY MOUTH DAILY   isosorbide mononitrate (IMDUR) 30 MG 24 hr tablet TAKE 1 TABLET BY MOUTH EVERY DAY   metFORMIN (GLUCOPHAGE) 500 MG tablet TAKE 1 TABLET(500 MG) BY MOUTH DAILY WITH SUPPER   mometasone (ELOCON) 0.1 % cream Apply 1 Application topically daily as needed (itchy ears). Use as directed   Multiple Vitamin (MULTIVITAMIN) capsule  Take 1 capsule by mouth daily.   omeprazole (PRILOSEC) 20 MG capsule TAKE 1 CAPSULE(20 MG) BY MOUTH DAILY   ondansetron (ZOFRAN) 4 MG tablet Take 1 tablet (4 mg total) by mouth every 8 (eight) hours as needed for nausea or vomiting.   ondansetron (ZOFRAN) 8 MG tablet Take 1 tablet (8 mg total) by mouth every 8 (eight) hours as needed for nausea or vomiting.   oxyCODONE (OXY IR/ROXICODONE) 5 MG immediate release tablet Take 1 tablet (5 mg total) by mouth every 6 (six) hours as needed for severe pain.   Semaglutide,0.25 or 0.5MG /DOS, (OZEMPIC, 0.25 OR 0.5  MG/DOSE,) 2 MG/3ML SOPN Inject 0.5 mg into the skin every Monday.   venetoclax (VENCLEXTA) 100 MG tablet Take 1 tablet (100 mg total) by mouth daily. Tablets should be swallowed whole with a meal and a full glass of water.   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   Gastritis    GERD (gastroesophageal reflux disease)    Headache(784.0)    History of blood transfusion    "I"ve had 20 some; thru birth of children, loss of blood, last 2 were in ~ 1999 before my cancer surgery" (09/16/2015)   History of hiatal hernia    History of shingles    Hx of colonic polyps    Hyperlipidemia    Hypertension    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HCC) 2000   Myocardial infarction (HCC)    1997   Pneumonia    X 1   Postoperative hematoma involving circulatory system following cardiac catheterization 09/17/2015   S/P cardiac cath 09/16/15 09/17/2015   Type II diabetes mellitus (HCC)    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[facility])    as a child   Venous insufficiency    Past Surgical History:  Procedure Laterality Date   BREAST BIOPSY Left 03/10/2022   MM LT BREAST BX W LOC DEV 1ST LESION IMAGE BX SPEC STEREO GUIDE 03/10/2022 [facility]    BREAST BIOPSY  05/01/2022   MM LT RADIOACTIVE SEED LOC MAMMO GUIDE 05/01/2022 [facility]   BREAST LUMPECTOMY WITH RADIOACTIVE SEED LOCALIZATION Left 05/02/2022   Procedure: LEFT BREAST LUMPECTOMY WITH RADIOACTIVE SEED LOCALIZATION;  Surgeon: Harriette Bouillon, MD;  Location: [facility] OR;  Service: General;  Laterality: Left;   CARDIAC CATHETERIZATION  1997; 2008; 09/16/2015   CARDIAC CATHETERIZATION N/A 09/16/2015   Procedure: Right/Left Heart Cath and Coronary/Graft Angiography;  Surgeon: Runell Gess, MD;  Location: [facility] INVASIVE [facility];  Service: Cardiovascular;  Laterality: N/A;   CESAREAN SECTION  1966   COLONOSCOPY     CORONARY ARTERY BYPASS GRAFT  1997   CABG X5   EYE  SURGERY     2 laser surgeries on left eye with implant   EYE SURGERY Bilateral    cataracts   FEMUR IM NAIL Left 08/28/2018   Procedure: INTRAMEDULLARY (IM) NAIL FEMORAL;  Surgeon: Samson Frederic, MD;  Location: [facility];  Service: Orthopedics;  Laterality: Left;   FRACTURE SURGERY     HERNIA REPAIR     LAPAROSCOPIC ASSISTED VENTRAL HERNIA REPAIR  09/2008    with incarcerated colon;  Dr. Ezzard Standing   MOHS SURGERY  06/2016   [facility]   PATELLA FRACTURE SURGERY Left 11/1994   "crushed my knee"; put in a plate & 6 screws"   POLYPECTOMY     RE-EXCISION OF BREAST LUMPECTOMY Left 05/18/2022   Procedure: RE-EXCISION OF LEFT BREAST LUMPECTOMY;  Surgeon: Harriette Bouillon, MD;  Location: [facility];  Service: General;  Laterality: Left;  60 MIN ROOM 8   REFRACTIVE SURGERY Left 07/30/2015   resection of small bowel carcinoma  06/1998   Dr. Samuella Cota   SHOULDER ARTHROSCOPY W/ ROTATOR CUFF REPAIR Right 1997   SMALL INTESTINE SURGERY     TONSILLECTOMY  1960s   TOTAL KNEE ARTHROPLASTY WITH HARDWARE REMOVAL Left 12/1994   (infex, hardware removed )- not replacement per patient   TUBAL LIGATION  1966   Family History  Problem Relation Age of Onset   Heart disease Mother 66   Heart disease Father 63       MI    Hypertension Child    Heart attack Child    Diabetes Child    Heart disease Maternal Aunt        x 2, all deceased   Heart disease Maternal Uncle        x 4, all deceased   Emphysema Brother 16   Colon cancer Neg Hx    Breast cancer Neg Hx    Rectal cancer Neg Hx    Stomach cancer Neg Hx    Social History   Socioeconomic History   Marital status: Widowed    Spouse name: Not on file   Number of children: 2   Years of education: Not on file   Highest education level: Not on file  Occupational History   Occupation: retired-- [employer]     Employer: RETIRED  Tobacco Use   Smoking status: Never   Smokeless tobacco: Never  Vaping Use   Vaping Use: Never used  Substance and Sexual Activity   Alcohol use: No    Alcohol/week: 0.0 standard drinks of alcohol   Drug use: No   Sexual activity: Not Currently  Other Topics Concern   Not on file  Social History Narrative   Lives by herself, lost husband ~ 2004 , still drives    1 child in [place]   I child in [place]   Social Determinants of Health   Financial Resource Strain: Low Risk  (06/29/2022)   Overall Financial Resource Strain (CARDIA)    Difficulty of Paying Living Expenses: Not hard at all  Food Insecurity: No Food Insecurity (06/29/2022)   Hunger Vital Sign    Worried About Running Out of Food in the Last Year: Never true    Ran Out of Food in the Last Year: Never true  Transportation Needs: No Transportation Needs (06/29/2022)   PRAPARE - Administrator, Civil Service (Medical): No    Lack of Transportation (Non-Medical): No  Physical Activity: Inactive (06/29/2022)   Exercise Vital Sign    Days of Exercise per Week: 0  days    Minutes of Exercise per Session: 0 min  Stress: No Stress Concern Present (06/29/2022)   [employer] of Occupational Health - Occupational Stress Questionnaire    Feeling of Stress : Only a little  Social Connections: Moderately Integrated (06/29/2022)   Social Connection and  Isolation Panel [NHANES]    Frequency of Communication with Friends and Family: More than three times a week    Frequency of Social Gatherings with Friends and Family: More than three times a week    Attends Religious Services: More than 4 times per year    Active Member of [employer] or Organizations: Yes    Attends Banker Meetings: More than 4 times per year    Marital Status: Widowed    Tobacco Counseling Counseling given: Not Answered   Clinical Intake:  Pre-visit preparation completed: Yes  Pain : No/denies pain  Nutritional Risks: None Diabetes: Yes CBG done?: No Did pt. bring in CBG monitor from home?: No  How often do you need to have someone help you when you read instructions, pamphlets, or other written materials from your doctor or pharmacy?: 1 - Never  Interpreter Needed?: No  Information entered by :: Donne Anon, CMA   Activities of Daily Living    06/29/2022   11:05 AM 05/18/2022    1:19 PM  In your present state of health, do you have any difficulty performing the following activities:  Hearing? 0 0  Vision? 0 0  Comment wears readers   Difficulty concentrating or making decisions? 1 0  Comment forgetfullness sometimes   Walking or climbing stairs? 0 0  Dressing or bathing? 0 0  Doing errands, shopping? 0   Preparing Food and eating ? N   Using the Toilet? N   In the past six months, have you accidently leaked urine? N   Do you have problems with loss of bowel control? N   Managing your Medications? N   Managing your Finances? N   Housekeeping or managing your Housekeeping? N     Patient Care Team: Wanda Plump, MD as PCP - General (Internal Medicine) Runell Gess, MD as PCP - Cardiology (Cardiology) Josph Macho, MD as Consulting Physician (Oncology) Reather Littler, MD as Consulting Physician (Endocrinology) Cherlyn Roberts, MD as Consulting Physician (Dermatology) Stephannie Li, MD as Consulting Physician  (Ophthalmology) Gwendel Hanson, RN as Oncology Nurse Navigator  Indicate any recent Medical Services you may have received from other than [facility] providers in the past year (date may be approximate).     Assessment:   This is a routine wellness examination for [name].  Hearing/Vision screen No results found.  Dietary issues and exercise activities discussed:     Goals Addressed   None    Depression Screen    06/29/2022   11:16 AM 10/26/2021   10:59 AM 06/22/2021   10:49 AM 12/14/2020   11:04 AM 06/14/2020   11:23 AM 05/02/2019    1:27 PM 09/20/2018   10:25 AM  PHQ 2/9 Scores  PHQ - 2 Score 0 2 2 2 2 1  0  PHQ- 9 Score  6 6 6 6 11      Fall Risk    06/29/2022   11:05 AM 10/26/2021   11:06 AM 06/23/2021    2:24 PM 06/22/2021   11:01 AM 12/14/2020   11:04 AM  Fall Risk   Falls in the past year? 0 0 0 0 0  Number falls in past yr: 0  0 0 0 0  Injury with Fall? 0 0 0 0 0  Risk for fall due to : Impaired balance/gait  No Fall Risks    Follow up Falls evaluation completed Falls evaluation completed Falls evaluation completed Falls evaluation completed Falls evaluation completed    MEDICARE RISK AT HOME:  Medicare Risk at Home - 06/29/22 1108     Any stairs in or around the home? Yes    If so, are there any without handrails? No    Home free of loose throw rugs in walkways, pet beds, electrical cords, etc? Yes    Adequate lighting in your home to reduce risk of falls? Yes    Life alert? No    Use of a cane, walker or w/c? Yes    Grab bars in the bathroom? No    Shower chair or bench in shower? No    Elevated toilet seat or a handicapped toilet? Yes   comfort height            TIMED UP AND GO:  Was the test performed?  No    Cognitive Function:    01/07/2016    2:24 PM  MMSE - Mini Mental State Exam  Orientation to time 5  Orientation to Place 5  Registration 3  Attention/ Calculation 5  Recall 3  Language- name 2 objects 2  Language- repeat 1  Language-  follow 3 step command 3  Language- read & follow direction 1  Write a sentence 1  Copy design 0  Total score 29        06/29/2022   11:24 AM 06/23/2021    2:32 PM  6CIT Screen  What Year? 0 points 0 points  What month? 0 points 0 points  What time? 0 points 0 points  Count back from 20 0 points 0 points  Months in reverse 0 points 0 points  Repeat phrase 0 points 0 points  Total Score 0 points 0 points    Immunizations Immunization History  Administered Date(s) Administered   Fluad Quad(high Dose 65+) 10/01/2018, 01/13/2022   H1N1 12/10/2007   Influenza Split 10/03/2010, 11/09/2011   Influenza Whole 10/26/2004, 11/05/2008, 10/15/2009   Influenza, High Dose Seasonal PF 11/30/2015, 10/04/2017   Influenza,inj,Quad PF,6+ Mos 11/12/2012, 11/03/2013, 10/12/2016, 10/21/2020   Influenza-Unspecified 11/16/2014, 11/03/2019   PFIZER(Purple Top)SARS-COV-2 Vaccination 02/06/2019, 03/03/2019, 11/18/2019, 06/09/2020   Pfizer Covid-19 Vaccine Bivalent Booster 25yrs & up 12/21/2020   Pneumococcal Conjugate-13 03/19/2013   Pneumococcal Polysaccharide-23 10/13/2008, 04/04/2017   Tdap 09/01/2010, 03/10/2021   Zoster Recombinat (Shingrix) 08/01/2021, 10/07/2021   Zoster, Live 03/19/2013    TDAP status: Up to date  Flu Vaccine status: Up to date  Pneumococcal vaccine status: Up to date  Covid-19 vaccine status: Information provided on how to obtain vaccines.   Qualifies for Shingles Vaccine? Yes   Zostavax completed Yes   Shingrix Completed?: Yes  Screening Tests Health Maintenance  Topic Date Due   FOOT EXAM  06/14/2021   COVID-19 Vaccine (6 - 2023-24 season) 09/02/2021   OPHTHALMOLOGY EXAM  04/14/2022   INFLUENZA VACCINE  08/03/2022   HEMOGLOBIN A1C  10/26/2022   Diabetic kidney evaluation - eGFR measurement  05/11/2023   Diabetic kidney evaluation - Urine ACR  05/11/2023   Medicare Annual Wellness (AWV)  06/29/2023   DTaP/Tdap/Td (3 - Td or Tdap) 03/11/2031   Pneumonia  Vaccine 26+ Years old  Completed   DEXA SCAN  Completed   Zoster Vaccines- Shingrix  Completed  HPV VACCINES  Aged Out   Colonoscopy  Discontinued    Health Maintenance  Health Maintenance Due  Topic Date Due   FOOT EXAM  06/14/2021   COVID-19 Vaccine (6 - 2023-24 season) 09/02/2021   OPHTHALMOLOGY EXAM  04/14/2022    Colorectal cancer screening: No longer required.   Mammogram status: Completed 12/14/21. Repeat every year  Bone Density status: pt will discuss with PCP at next office visit.  Lung Cancer Screening: (Low Dose CT Chest recommended if Age 64-80 years, 20 pack-year currently smoking OR have quit w/in 15years.) does not qualify.   Additional Screening:  Hepatitis C Screening: does not qualify  Vision Screening: Recommended annual ophthalmology exams for early detection of glaucoma and other disorders of the eye. Is the patient up to date with their annual eye exam?  No but goes retina specialist every 3-4 months Who is the provider or what is the name of the office in which the patient attends annual eye exams? [facility] If pt is not established with a provider, would they like to be referred to a provider to establish care? No .   Dental Screening: Recommended annual dental exams for proper oral hygiene  Diabetic Foot Exam: Diabetic Foot Exam: Completed N/a  Community Resource Referral / Chronic Care Management: CRR required this visit?  No   CCM required this visit?  No     Plan:     I have personally reviewed and noted the following in the patient's chart:   Medical and social history Use of alcohol, tobacco or illicit drugs  Current medications and supplements including opioid prescriptions. Patient is currently taking opioid prescriptions. Information provided to patient regarding non-opioid alternatives. Patient advised to discuss non-opioid treatment plan with their provider. Functional ability and status Nutritional status Physical  activity Advanced directives List of other physicians Hospitalizations, surgeries, and ER visits in previous 12 months Vitals Screenings to include cognitive, depression, and falls Referrals and appointments  In addition, I have reviewed and discussed with patient certain preventive protocols, quality metrics, and best practice recommendations. A written personalized care plan for preventive services as well as general preventive health recommendations were provided to patient.     Donne Anon, CMA   06/29/2022   After Visit Summary: (Declined) Due to this being a telephonic visit, with patients personalized plan was offered to patient but patient Declined AVS at this time   Nurse Notes: None

## 2022-06-29 NOTE — Patient Instructions (Signed)
[name] , Thank you for taking time to come for your Medicare Wellness Visit. I appreciate your ongoing commitment to your health goals. Please review the following plan we discussed and let me know if I can assist you in the future.     This is a list of the screening recommended for you and due dates:  Health Maintenance  Topic Date Due   Complete foot exam   06/14/2021   COVID-19 Vaccine (6 - 2023-24 season) 09/02/2021   Eye exam for diabetics  04/14/2022   Flu Shot  08/03/2022   Hemoglobin A1C  10/26/2022   Yearly kidney function blood test for diabetes  05/11/2023   Yearly kidney health urinalysis for diabetes  05/11/2023   Medicare Annual Wellness Visit  06/29/2023   DTaP/Tdap/Td vaccine (3 - Td or Tdap) 03/11/2031   Pneumonia Vaccine  Completed   DEXA scan (bone density measurement)  Completed   Zoster (Shingles) Vaccine  Completed   HPV Vaccine  Aged Out   Colon Cancer Screening  Discontinued    Next appointment: Follow up in one year for your annual wellness visit.   Preventive Care 27 Years and Older, Female Preventive care refers to lifestyle choices and visits with your health care provider that can promote health and wellness. What does preventive care include? A yearly physical exam. This is also called an annual well check. Dental exams once or twice a year. Routine eye exams. Ask your health care provider how often you should have your eyes checked. Personal lifestyle choices, including: Daily care of your teeth and gums. Regular physical activity. Eating a healthy diet. Avoiding tobacco and drug use. Limiting alcohol use. Practicing safe sex. Taking low-dose aspirin every day. Taking vitamin and mineral supplements as recommended by your health care provider. What happens during an annual well check? The services and screenings done by your health care provider during your annual well check will depend on your age, overall health, lifestyle risk  factors, and family history of disease. Counseling  Your health care provider may ask you questions about your: Alcohol use. Tobacco use. Drug use. Emotional well-being. Home and relationship well-being. Sexual activity. Eating habits. History of falls. Memory and ability to understand (cognition). Work and work Astronomer. Reproductive health. Screening  You may have the following tests or measurements: Height, weight, and BMI. Blood pressure. Lipid and cholesterol levels. These may be checked every 5 years, or more frequently if you are over 43 years old. Skin check. Lung cancer screening. You may have this screening every year starting at age 75 if you have a 30-pack-year history of smoking and currently smoke or have quit within the past 15 years. Fecal occult blood test (FOBT) of the stool. You may have this test every year starting at age 28. Flexible sigmoidoscopy or colonoscopy. You may have a sigmoidoscopy every 5 years or a colonoscopy every 10 years starting at age 76. Hepatitis C blood test. Hepatitis B blood test. Sexually transmitted disease (STD) testing. Diabetes screening. This is done by checking your blood sugar (glucose) after you have not eaten for a while (fasting). You may have this done every 1-3 years. Bone density scan. This is done to screen for osteoporosis. You may have this done starting at age 71. Mammogram. This may be done every 1-2 years. Talk to your health care provider about how often you should have regular mammograms. Talk with your health care provider about your test results, treatment options, and if necessary, the  need for more tests. Vaccines  Your health care provider may recommend certain vaccines, such as: Influenza vaccine. This is recommended every year. Tetanus, diphtheria, and acellular pertussis (Tdap, Td) vaccine. You may need a Td booster every 10 years. Zoster vaccine. You may need this after age 68. Pneumococcal 13-valent  conjugate (PCV13) vaccine. One dose is recommended after age 34. Pneumococcal polysaccharide (PPSV23) vaccine. One dose is recommended after age 23. Talk to your health care provider about which screenings and vaccines you need and how often you need them. This information is not intended to replace advice given to you by your health care provider. Make sure you discuss any questions you have with your health care provider. Document Released: 01/15/2015 Document Revised: 09/08/2015 Document Reviewed: 10/20/2014 Elsevier Interactive Patient Education  2017 [place] Prevention in the Home Falls can cause injuries. They can happen to people of all ages. There are many things you can do to make your home safe and to help prevent falls. What can I do on the outside of my home? Regularly fix the edges of walkways and driveways and fix any cracks. Remove anything that might make you trip as you walk through a door, such as a raised step or threshold. Trim any bushes or trees on the path to your home. Use bright outdoor lighting. Clear any walking paths of anything that might make someone trip, such as rocks or tools. Regularly check to see if handrails are loose or broken. Make sure that both sides of any steps have handrails. Any raised decks and porches should have guardrails on the edges. Have any leaves, snow, or ice cleared regularly. Use sand or salt on walking paths during winter. Clean up any spills in your garage right away. This includes oil or grease spills. What can I do in the bathroom? Use night lights. Install grab bars by the toilet and in the tub and shower. Do not use towel bars as grab bars. Use non-skid mats or decals in the tub or shower. If you need to sit down in the shower, use a plastic, non-slip stool. Keep the floor dry. Clean up any water that spills on the floor as soon as it happens. Remove soap buildup in the tub or shower regularly. Attach bath mats  securely with double-sided non-slip rug tape. Do not have throw rugs and other things on the floor that can make you trip. What can I do in the bedroom? Use night lights. Make sure that you have a light by your bed that is easy to reach. Do not use any sheets or blankets that are too big for your bed. They should not hang down onto the floor. Have a firm chair that has side arms. You can use this for support while you get dressed. Do not have throw rugs and other things on the floor that can make you trip. What can I do in the kitchen? Clean up any spills right away. Avoid walking on wet floors. Keep items that you use a lot in easy-to-reach places. If you need to reach something above you, use a strong step stool that has a grab bar. Keep electrical cords out of the way. Do not use floor polish or wax that makes floors slippery. If you must use wax, use non-skid floor wax. Do not have throw rugs and other things on the floor that can make you trip. What can I do with my stairs? Do not leave any items on the  stairs. Make sure that there are handrails on both sides of the stairs and use them. Fix handrails that are broken or loose. Make sure that handrails are as long as the stairways. Check any carpeting to make sure that it is firmly attached to the stairs. Fix any carpet that is loose or worn. Avoid having throw rugs at the top or bottom of the stairs. If you do have throw rugs, attach them to the floor with carpet tape. Make sure that you have a light switch at the top of the stairs and the bottom of the stairs. If you do not have them, ask someone to add them for you. What else can I do to help prevent falls? Wear shoes that: Do not have high heels. Have rubber bottoms. Are comfortable and fit you well. Are closed at the toe. Do not wear sandals. If you use a stepladder: Make sure that it is fully opened. Do not climb a closed stepladder. Make sure that both sides of the stepladder  are locked into place. Ask someone to hold it for you, if possible. Clearly mark and make sure that you can see: Any grab bars or handrails. First and last steps. Where the edge of each step is. Use tools that help you move around (mobility aids) if they are needed. These include: Canes. Walkers. Scooters. Crutches. Turn on the lights when you go into a dark area. Replace any light bulbs as soon as they burn out. Set up your furniture so you have a clear path. Avoid moving your furniture around. If any of your floors are uneven, fix them. If there are any pets around you, be aware of where they are. Review your medicines with your doctor. Some medicines can make you feel dizzy. This can increase your chance of falling. Ask your doctor what other things that you can do to help prevent falls. This information is not intended to replace advice given to you by your health care provider. Make sure you discuss any questions you have with your health care provider. Document Released: 10/15/2008 Document Revised: 05/27/2015 Document Reviewed: 01/23/2014 Elsevier Interactive Patient Education  2017 [employer].

## 2022-06-30 ENCOUNTER — Inpatient Hospital Stay: Payer: Medicare Other | Attending: Hematology & Oncology

## 2022-06-30 NOTE — Progress Notes (Signed)
[facility] Clinical Social Work  Clinical Social Work was referred by medical provider for assessment of psychosocial needs.  Clinical Social Worker contacted patient by phone to offer support and assess for needs.    Patient expressed feeling discouraged due to the amount of procedures she is having to endure since her surgery.  She stated she "just didn't feel good," today.  Patient also stated she was concerned that she might have to have radiation.  Patient has a son who lives in [place] who has Stage IV cancer and is in treatment.  She is unsure of his prognosis, but feels she might have to travel to see him soon.  CSW provided active listening and supportive counseling     Dorothey Baseman, LCSW  Clinical Social Worker [facility]

## 2022-07-05 ENCOUNTER — Other Ambulatory Visit (HOSPITAL_COMMUNITY): Payer: Self-pay

## 2022-07-07 ENCOUNTER — Encounter: Payer: Self-pay | Admitting: Hematology & Oncology

## 2022-07-07 ENCOUNTER — Inpatient Hospital Stay: Payer: Medicare Other | Attending: Hematology & Oncology

## 2022-07-07 ENCOUNTER — Inpatient Hospital Stay: Payer: Medicare Other

## 2022-07-07 ENCOUNTER — Other Ambulatory Visit: Payer: Self-pay | Admitting: *Deleted

## 2022-07-07 ENCOUNTER — Other Ambulatory Visit: Payer: Self-pay

## 2022-07-07 ENCOUNTER — Inpatient Hospital Stay (HOSPITAL_BASED_OUTPATIENT_CLINIC_OR_DEPARTMENT_OTHER): Payer: Medicare Other | Admitting: Hematology & Oncology

## 2022-07-07 VITALS — BP 147/55 | HR 70 | Temp 98.7°F | Resp 19 | Ht 66.0 in | Wt 170.0 lb

## 2022-07-07 DIAGNOSIS — D508 Other iron deficiency anemias: Secondary | ICD-10-CM

## 2022-07-07 DIAGNOSIS — D509 Iron deficiency anemia, unspecified: Secondary | ICD-10-CM | POA: Insufficient documentation

## 2022-07-07 DIAGNOSIS — C179 Malignant neoplasm of small intestine, unspecified: Secondary | ICD-10-CM | POA: Diagnosis not present

## 2022-07-07 DIAGNOSIS — K909 Intestinal malabsorption, unspecified: Secondary | ICD-10-CM

## 2022-07-07 DIAGNOSIS — D0512 Intraductal carcinoma in situ of left breast: Secondary | ICD-10-CM

## 2022-07-07 DIAGNOSIS — R748 Abnormal levels of other serum enzymes: Secondary | ICD-10-CM

## 2022-07-07 DIAGNOSIS — Z79899 Other long term (current) drug therapy: Secondary | ICD-10-CM | POA: Insufficient documentation

## 2022-07-07 DIAGNOSIS — E1169 Type 2 diabetes mellitus with other specified complication: Secondary | ICD-10-CM

## 2022-07-07 DIAGNOSIS — C9110 Chronic lymphocytic leukemia of B-cell type not having achieved remission: Secondary | ICD-10-CM

## 2022-07-07 DIAGNOSIS — R768 Other specified abnormal immunological findings in serum: Secondary | ICD-10-CM

## 2022-07-07 DIAGNOSIS — Z85038 Personal history of other malignant neoplasm of large intestine: Secondary | ICD-10-CM | POA: Diagnosis not present

## 2022-07-07 DIAGNOSIS — Q998 Other specified chromosome abnormalities: Secondary | ICD-10-CM | POA: Insufficient documentation

## 2022-07-07 LAB — CBC WITH DIFFERENTIAL (CANCER CENTER ONLY)
Abs Immature Granulocytes: 0.02 10*3/uL (ref 0.00–0.07)
Basophils Absolute: 0 10*3/uL (ref 0.0–0.1)
Basophils Relative: 0 %
Eosinophils Absolute: 0 10*3/uL (ref 0.0–0.5)
Eosinophils Relative: 1 %
HCT: 32.6 % — ABNORMAL LOW (ref 36.0–46.0)
Hemoglobin: 10.7 g/dL — ABNORMAL LOW (ref 12.0–15.0)
Immature Granulocytes: 0 %
Lymphocytes Relative: 26 %
Lymphs Abs: 1.7 10*3/uL (ref 0.7–4.0)
MCH: 32 pg (ref 26.0–34.0)
MCHC: 32.8 g/dL (ref 30.0–36.0)
MCV: 97.6 fL (ref 80.0–100.0)
Monocytes Absolute: 0.8 10*3/uL (ref 0.1–1.0)
Monocytes Relative: 12 %
Neutro Abs: 3.8 10*3/uL (ref 1.7–7.7)
Neutrophils Relative %: 61 %
Platelet Count: 200 10*3/uL (ref 150–400)
RBC: 3.34 MIL/uL — ABNORMAL LOW (ref 3.87–5.11)
RDW: 13.2 % (ref 11.5–15.5)
WBC Count: 6.4 10*3/uL (ref 4.0–10.5)
nRBC: 0 % (ref 0.0–0.2)

## 2022-07-07 LAB — CMP (CANCER CENTER ONLY)
ALT: 14 U/L (ref 0–44)
AST: 20 U/L (ref 15–41)
Albumin: 4 g/dL (ref 3.5–5.0)
Alkaline Phosphatase: 52 U/L (ref 38–126)
Anion gap: 9 (ref 5–15)
BUN: 11 mg/dL (ref 8–23)
CO2: 28 mmol/L (ref 22–32)
Calcium: 9.8 mg/dL (ref 8.9–10.3)
Chloride: 104 mmol/L (ref 98–111)
Creatinine: 1.42 mg/dL — ABNORMAL HIGH (ref 0.44–1.00)
GFR, Estimated: 37 mL/min — ABNORMAL LOW (ref >60)
Glucose, Bld: 88 mg/dL (ref 70–99)
Potassium: 4.2 mmol/L (ref 3.5–5.1)
Sodium: 141 mmol/L (ref 135–145)
Total Bilirubin: 0.6 mg/dL (ref 0.3–1.2)
Total Protein: 6.6 g/dL (ref 6.5–8.1)

## 2022-07-07 LAB — TSH: TSH: 1.563 u[IU]/mL (ref 0.350–4.500)

## 2022-07-07 LAB — IRON AND IRON BINDING CAPACITY (CC-WL,HP ONLY)
Iron: 87 ug/dL (ref 28–170)
Saturation Ratios: 28 % (ref 10.4–31.8)
TIBC: 315 ug/dL (ref 250–450)
UIBC: 228 ug/dL (ref 148–442)

## 2022-07-07 LAB — FERRITIN: Ferritin: 111 ng/mL (ref 11–307)

## 2022-07-07 LAB — SAVE SMEAR(SSMR), FOR PROVIDER SLIDE REVIEW

## 2022-07-07 NOTE — Progress Notes (Signed)
Hematology and Oncology Follow Up Visit  Salem Santana Arteaga [id number] Mar 07, 1940 82 y.o. 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 Seats is here today for follow-up.  She is doing okay although she is having issues with respect to the lumpectomy that she had for the DCIS.  She unfortunately has had recurrent infections.  She has had recurrent surgery.  She has been followed by surgery for this.  Hopefully, she will not have any additional surgeries.  We saw her after her initial diagnosis of DCIS, she had a positive margin.  She went back again for reexcision.  This was done on the May 16.  This did not show any evidence of DCIS.  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  I am unsure if she really is going to need any kind of radiation therapy.  Again at her age, I am not sure how much radiation would contribute to her overall prognosis.  Again, she has had this infection.  She is not really able to do anything until this infection gets healed up.  She is happy that her son is doing better.  He was admitted to hospital in [place] because of sepsis.  It sounds like he is improving.  She is still on venetoclax for the CLL.  This is helped control her CLL quite nicely.  She does feel tired.  We will have to see what her iron studies look like.  She has had no fever.  She has had no diarrhea.  She has had no cough or shortness of breath.  Overall, I would say performance status is probably ECOG 1-2.  .   Medications:  Allergies as of 07/07/2022       Reactions   Codeine  Other (See Comments)   REACTION: makes her nervous, orTylenol #3   Ibuprofen Other (See Comments)   REACTION: nervous   Meperidine Hcl Nausea And Vomiting   Naproxen Sodium Other (See Comments)   Or Advil REACTION: nervous   Tramadol Other (See Comments)   Insomnia    Tylenol With Codeine #3 [acetaminophen-codeine] Nausea And Vomiting        Medication List        Accurate as of July 07, 2022  1:26 PM. If you have any questions, ask your nurse or doctor.          acetaminophen 500 MG tablet Commonly known as: TYLENOL Take 1 tablet (500 mg total) by mouth every 6 (six) hours as needed.   amLODipine 5 MG tablet Commonly known as: NORVASC TAKE 1 TABLET(5 MG) BY MOUTH DAILY   aspirin 81 MG chewable tablet Chew 1 tablet (81 mg total) by mouth daily.   atenolol 100 MG tablet Commonly known as: TENORMIN TAKE 1 TABLET(100 MG) BY MOUTH DAILY   atorvastatin 20 MG tablet Commonly known as: LIPITOR TAKE 1 TABLET(20 MG) BY MOUTH DAILY   cholecalciferol 1000 units tablet Commonly known as: VITAMIN D Take 1,000 Units by mouth daily.   clopidogrel 75 MG tablet Commonly known as: PLAVIX Take 1 tablet (75 mg total) by  mouth daily.   fluticasone 50 MCG/ACT nasal spray Commonly known as: FLONASE SHAKE LIQUID AND USE 2 SPRAYS IN EACH NOSTRIL DAILY   insulin NPH Human 100 UNIT/ML injection Commonly known as: NOVOLIN N Inject 24-36 Units into the skin See admin instructions. Inject 36 units into the skin every morning and inject 24 units into the skin at bedtime.   insulin regular 100 units/mL injection Commonly known as: NOVOLIN R Inject 10 Units into the skin 2 (two) times daily before a meal.   INSULIN SYRINGE .5CC/31GX5/16" 31G X 5/16" 0.5 ML Misc USE TO INJECT INSULIN 5 TIMES PER DAY   irbesartan 300 MG tablet Commonly known as: AVAPRO TAKE 1/2 TABLET(150 MG) BY MOUTH DAILY   isosorbide mononitrate 30 MG 24 hr tablet Commonly known as: IMDUR TAKE 1 TABLET BY  MOUTH EVERY DAY   metFORMIN 500 MG tablet Commonly known as: GLUCOPHAGE TAKE 1 TABLET(500 MG) BY MOUTH DAILY WITH SUPPER   mometasone 0.1 % cream Commonly known as: ELOCON Apply 1 Application topically daily as needed (itchy ears). Use as directed   multivitamin capsule Take 1 capsule by mouth daily.   omeprazole 20 MG capsule Commonly known as: PRILOSEC TAKE 1 CAPSULE(20 MG) BY MOUTH DAILY   ondansetron 8 MG tablet Commonly known as: ZOFRAN Take 1 tablet (8 mg total) by mouth every 8 (eight) hours as needed for nausea or vomiting.   ondansetron 4 MG tablet Commonly known as: Zofran Take 1 tablet (4 mg total) by mouth every 8 (eight) hours as needed for nausea or vomiting.   OneTouch Ultra test strip Generic drug: glucose blood USE AS DIRECTED THREE TIMES DAILY   oxyCODONE 5 MG immediate release tablet Commonly known as: Oxy IR/ROXICODONE Take 1 tablet (5 mg total) by mouth every 6 (six) hours as needed for severe pain.   Ozempic (0.25 or 0.5 MG/DOSE) 2 MG/3ML Sopn Generic drug: Semaglutide(0.25 or 0.5MG /DOS) Inject 0.5 mg into the skin every Monday.   Venclexta 100 MG tablet Generic drug: venetoclax Take 1 tablet (100 mg total) by mouth daily. Tablets should be swallowed whole with a meal and a full glass of water.        Allergies:  Allergies  Allergen Reactions   Codeine Other (See Comments)    REACTION: makes her nervous, orTylenol #3   Ibuprofen Other (See Comments)    REACTION: nervous   Meperidine Hcl Nausea And Vomiting   Naproxen Sodium Other (See Comments)    Or Advil  REACTION: nervous   Tramadol Other (See Comments)    Insomnia    Tylenol With Codeine #3 [Acetaminophen-Codeine] Nausea And Vomiting    Past Medical History, Surgical history, Social history, and Family History were reviewed and updated.  Review of Systems: Review of Systems  Constitutional: Negative.   HENT: Negative.    Eyes: Negative.   Respiratory: Negative.     Cardiovascular: Negative.   Gastrointestinal: Negative.   Genitourinary: Negative.   Musculoskeletal: Negative.   Skin: Negative.   Neurological: Negative.   Endo/Heme/Allergies: Negative.   Psychiatric/Behavioral: Negative.       Physical Exam:  height is 5\' 6"  (1.676 m) and weight is 170 lb (77.1 kg). Her oral temperature is 98.7 F (37.1 C). Her blood pressure is 147/55 (abnormal) and her pulse is 70. Her respiration is 19 and oxygen saturation is 99%.   Wt Readings from Last 3 Encounters:  07/07/22 170 lb (77.1 kg)  05/18/22 171 lb 4.8 oz (77.7 kg)  05/16/22 172  lb 3.2 oz (78.1 kg)    Physical Exam Vitals reviewed.  Constitutional:      Comments: Her breast exam shows right breast no masses, edema or erythema.  There is no right axillary adenopathy.  Left breast shows the lumpectomy scar at the rim of the areola.  This is at about the 12 o'clock position.  This is healing.  There is a little bit of ecchymoses.  There is a little bit of tenderness.  There is no erythema or swelling.  There is no left axillary adenopathy.  HENT:     Head: Normocephalic and atraumatic.  Eyes:     Pupils: Pupils are equal, round, and reactive to light.  Cardiovascular:     Rate and Rhythm: Normal rate and regular rhythm.     Heart sounds: Normal heart sounds.  Pulmonary:     Effort: Pulmonary effort is normal.     Breath sounds: Normal breath sounds.  Abdominal:     General: Bowel sounds are normal.     Palpations: Abdomen is soft.  Musculoskeletal:        General: No tenderness or deformity. Normal range of motion.     Cervical back: Normal range of motion.  Lymphadenopathy:     Cervical: No cervical adenopathy.  Skin:    General: Skin is warm and dry.     Findings: No erythema or rash.  Neurological:     Mental Status: She is alert and oriented to person, place, and time.  Psychiatric:        Behavior: Behavior normal.        Thought Content: Thought content normal.         Judgment: Judgment normal.      Lab Results  Component Value Date   WBC 6.4 07/07/2022   HGB 10.7 (L) 07/07/2022   HCT 32.6 (L) 07/07/2022   MCV 97.6 07/07/2022   PLT 200 07/07/2022   Lab Results  Component Value Date   FERRITIN 98 05/08/2022   IRON 88 05/08/2022   TIBC 307 05/08/2022   UIBC 219 05/08/2022   IRONPCTSAT 29 05/08/2022   Lab Results  Component Value Date   RETICCTPCT 1.9 08/24/2020   RBC 3.34 (L) 07/07/2022   Lab Results  Component Value Date   KPAFRELGTCHN 32.8 (H) 05/16/2021   LAMBDASER 26.4 (H) 05/16/2021   KAPLAMBRATIO 1.24 05/16/2021   Lab Results  Component Value Date   IGGSERUM 900 02/20/2022   IGA 181 02/20/2022   IGMSERUM 19 (L) 02/20/2022   Lab Results  Component Value Date   TOTALPROTELP 6.5 11/18/2020   ALBUMINELP 3.6 11/18/2020   A1GS 0.3 11/18/2020   A2GS 0.9 11/18/2020   BETS 1.0 11/18/2020   BETA2SER 6.2 02/25/2014   GAMS 0.7 11/18/2020   MSPIKE Not Observed 11/18/2020   SPEI * 02/25/2014     Chemistry      Component Value Date/Time   NA 141 07/07/2022 1125   NA 147 (H) 11/03/2016 1018   NA 143 08/30/2016 0954   K 4.2 07/07/2022 1125   K 3.9 11/03/2016 1018   K 4.5 08/30/2016 0954   CL 104 07/07/2022 1125   CL 110 (H) 11/03/2016 1018   CO2 28 07/07/2022 1125   CO2 27 11/03/2016 1018   CO2 24 08/30/2016 0954   BUN 11 07/07/2022 1125   BUN 25 (H) 11/03/2016 1018   BUN 20.5 08/30/2016 0954   CREATININE 1.42 (H) 07/07/2022 1125   CREATININE 1.6 (H) 11/03/2016 1018  CREATININE 1.4 (H) 08/30/2016 0954      Component Value Date/Time   CALCIUM 9.8 07/07/2022 1125   CALCIUM 10.1 11/03/2016 1018   CALCIUM 9.8 08/30/2016 0954   ALKPHOS 52 07/07/2022 1125   ALKPHOS 112 (H) 11/03/2016 1018   ALKPHOS 113 08/30/2016 0954   AST 20 07/07/2022 1125   AST 23 08/30/2016 0954   ALT 14 07/07/2022 1125   ALT 29 11/03/2016 1018   ALT 20 08/30/2016 0954   BILITOT 0.6 07/07/2022 1125   BILITOT 0.38 08/30/2016 0954       Impression and Plan: Ms. Bivins is a very pleasant 82 yo caucasian female with CLL.  So far, we have not had any negative prognostic markers.  She does have the 13q-chromosomal abnormality.  This is typically construed as a good prognostic marker.  We now have a new problem.  This is the DCIS of the left breast.  She has had multiple surgeries.  She had a excision after her positive margin.  Thankfully, the reexcision did not show any malignancy.  From my point of view, I am not sure she will need radiation therapy.  Again she is 82 years old.  I am not sure how much radiation will contribute to her overall prognosis.  I am not sure if she even needs to have any type of aromatase inhibitor.  The CLL is not a problem right now which is good to see.  I will like to have her come back to see me in about 6 weeks or so.  Hopefully, at that point, she will be done with surgery.    We will see what her iron studies show.   Josph Macho, MD 7/5/20241:26 PM

## 2022-07-10 ENCOUNTER — Telehealth: Payer: Self-pay

## 2022-07-10 ENCOUNTER — Encounter: Payer: Self-pay | Admitting: *Deleted

## 2022-07-10 ENCOUNTER — Telehealth: Payer: Self-pay | Admitting: Radiation Oncology

## 2022-07-10 NOTE — Telephone Encounter (Signed)
Per Dr Myna Hidalgo: Please call let him know that the iron level is actually okay. Thanks.   Called and informed patient of lab results, patient verbalized understanding and denies any questions or concerns at this time.

## 2022-07-10 NOTE — Telephone Encounter (Signed)
Called pt as requested to f/u after most recent appt with Dr. Myna Hidalgo. Pt states she is unsure about wanting to schedule FUN/SIM at this time since she has had so many complications with surgery and affected site had to be re-opened a third time. Pt states Dr. Myna Hidalgo advised he would reach out to Dr. Roselind Messier to discuss her case. I advised pt I would f/u with Dr. Roselind Messier to see how we need to proceed at this time and then I would f/u with her.

## 2022-07-12 ENCOUNTER — Other Ambulatory Visit (HOSPITAL_COMMUNITY): Payer: Self-pay

## 2022-07-13 ENCOUNTER — Other Ambulatory Visit: Payer: Self-pay

## 2022-07-17 ENCOUNTER — Telehealth: Payer: Self-pay

## 2022-07-17 ENCOUNTER — Telehealth: Payer: Self-pay | Admitting: Radiation Oncology

## 2022-07-17 ENCOUNTER — Encounter: Payer: Self-pay | Admitting: Internal Medicine

## 2022-07-17 ENCOUNTER — Ambulatory Visit: Payer: Medicare Other | Admitting: Internal Medicine

## 2022-07-17 VITALS — BP 132/76 | HR 72 | Temp 97.9°F | Resp 18 | Ht 66.0 in | Wt 165.0 lb

## 2022-07-17 DIAGNOSIS — E785 Hyperlipidemia, unspecified: Secondary | ICD-10-CM

## 2022-07-17 DIAGNOSIS — I2510 Atherosclerotic heart disease of native coronary artery without angina pectoris: Secondary | ICD-10-CM | POA: Diagnosis not present

## 2022-07-17 DIAGNOSIS — M810 Age-related osteoporosis without current pathological fracture: Secondary | ICD-10-CM

## 2022-07-17 DIAGNOSIS — E1159 Type 2 diabetes mellitus with other circulatory complications: Secondary | ICD-10-CM | POA: Diagnosis not present

## 2022-07-17 DIAGNOSIS — I152 Hypertension secondary to endocrine disorders: Secondary | ICD-10-CM

## 2022-07-17 DIAGNOSIS — R0602 Shortness of breath: Secondary | ICD-10-CM

## 2022-07-17 DIAGNOSIS — F439 Reaction to severe stress, unspecified: Secondary | ICD-10-CM | POA: Diagnosis not present

## 2022-07-17 DIAGNOSIS — E1169 Type 2 diabetes mellitus with other specified complication: Secondary | ICD-10-CM

## 2022-07-17 MED ORDER — DENOSUMAB 60 MG/ML ~~LOC~~ SOSY
60.0000 mg | PREFILLED_SYRINGE | Freq: Once | SUBCUTANEOUS | Status: AC
Start: 2022-07-17 — End: 2022-07-17
  Administered 2022-07-17: 60 mg via SUBCUTANEOUS
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
## 2022-07-17 NOTE — Telephone Encounter (Signed)
Next injection due on/after 01/18/23.

## 2022-07-17 NOTE — Telephone Encounter (Signed)
-----   Message from [facility] C sent at 07/17/2022  4:11 PM EDT ----- Regarding: Prolia Pt received Prolia today, 07/17/22.

## 2022-07-17 NOTE — Patient Instructions (Addendum)
Vaccines I recommend: Covid booster Flu shot this fall RSV vaccine  Per our records you are due for your diabetic eye exam. Please contact your eye doctor to schedule an appointment. Please have them send copies of your office visit notes to [place]. Our fax number is [phone]. If you need a referral to an eye doctor please let us know.  Check the  blood pressure regularly BP GOAL is between 110/65 and  135/85. If it is consistently higher or lower, let me know     GO TO THE LAB : Get the blood work     GO TO THE FRONT DESK, PLEASE SCHEDULE YOUR APPOINTMENTS Come back for a checkup in 4 months

## 2022-07-17 NOTE — Telephone Encounter (Signed)
Spoke to pt to advise that Dr. Myna Hidalgo and Dr. Roselind Messier spoke about her case. Both are in agreement that she should see about XRT once she has been cleared by Dr. Luisa Hart regarding infection. Pt states next f/u is 7/22 and she plans to travel to see her sick son first week in August. I advised I plan to call after her f/u to check in and advised we can always plan her consult around her trip. She verbalized understanding.

## 2022-07-18 ENCOUNTER — Ambulatory Visit (HOSPITAL_BASED_OUTPATIENT_CLINIC_OR_DEPARTMENT_OTHER)
Admission: RE | Admit: 2022-07-18 | Discharge: 2022-07-18 | Disposition: A | Discharge status: Home / Self Care | Payer: Medicare Other | Source: Ambulatory Visit | Attending: Internal Medicine | Admitting: Internal Medicine

## 2022-07-18 ENCOUNTER — Telehealth: Payer: Self-pay | Admitting: *Deleted

## 2022-07-18 DIAGNOSIS — M810 Age-related osteoporosis without current pathological fracture: Secondary | ICD-10-CM | POA: Insufficient documentation

## 2022-07-18 LAB — LIPID PANEL
Cholesterol: 110 mg/dL (ref 0–200)
HDL: 37.2 mg/dL — ABNORMAL LOW (ref >39.00)
NonHDL: 72.47
Total CHOL/HDL Ratio: 3
Triglycerides: 262 mg/dL — ABNORMAL HIGH (ref 0.0–149.0)
VLDL: 52.4 mg/dL — ABNORMAL HIGH (ref 0.0–40.0)

## 2022-07-18 LAB — LDL CHOLESTEROL, DIRECT: Direct LDL: 38 mg/dL

## 2022-07-18 NOTE — Telephone Encounter (Signed)
 OPENED IN ERROR

## 2022-07-18 NOTE — Telephone Encounter (Deleted)
-----   Message from [facility] C sent at 07/17/2022  4:11 PM EDT ----- Regarding: Prolia Pt received Prolia today, 07/17/22.

## 2022-07-18 NOTE — Assessment & Plan Note (Signed)
CAD: On dual antiplatelet, no substernal chest pain, chronic DOE, was slightly worse last week but is back to normal.  Calves symmetric and nontender.  No change for now. HTN: Available BP readings reviewed, they have been slightly high but today is very good, recommend to check at home if possible. Continue amlodipine, Tenormin, Avapro.  Last creatinine at baseline around 1.4. Anemia: Per chart review, last hemoglobin 10.7, iron and ferritin normal, probably multifactorial. DM: Saw Endo 05/16/2022.  Was referred to the nutritionist CLL: Saw hematology oncology 07/07/2022.  Felt to be stable on Venclexta L breast DCIS: Status post excision, + margin, reexcision showed no residual malignancy.  Next steps per oncology. Osteoporosis: Prolia shot today.,  Check DEXA. Stress: Has a son is at [facility], reports he has advanced cancer, recovering from sepsis.  Listening therapy provided. Vaccines I recommend: RSV, COVID booster, flu shot this fall RTC 4 m

## 2022-07-20 ENCOUNTER — Other Ambulatory Visit: Payer: Self-pay | Admitting: *Deleted

## 2022-07-20 ENCOUNTER — Encounter: Payer: Self-pay | Admitting: Family

## 2022-07-20 DIAGNOSIS — C9110 Chronic lymphocytic leukemia of B-cell type not having achieved remission: Secondary | ICD-10-CM

## 2022-07-20 DIAGNOSIS — D0512 Intraductal carcinoma in situ of left breast: Secondary | ICD-10-CM

## 2022-07-20 DIAGNOSIS — C179 Malignant neoplasm of small intestine, unspecified: Secondary | ICD-10-CM

## 2022-07-20 MED ORDER — DIAZEPAM 5 MG PO TABS: 5.0000 mg | ORAL_TABLET | Freq: Four times a day (QID) | ORAL | 0 refills | Status: DC | PRN

## 2022-07-20 NOTE — Addendum Note (Signed)
Addended byConrad Gladstone D on: 07/20/2022 08:28 AM   Modules accepted: Orders

## 2022-07-20 NOTE — Progress Notes (Signed)
Patient had lumpectomy on 05/02/2022 but required multiple revisions. Patient is not interested in rxt or AI. Dr Myna Hidalgo agrees. Will follow up in several more weeks to allow for further incisional healing and finalize plan at that time.   Oncology Nurse Navigator Documentation     07/10/2022    2:00 PM  Oncology Nurse Navigator Flowsheets  Navigator Follow Up Date: 08/22/2022  Navigator Follow Up Reason: Follow-up Appointment  Navigator Location [facility]  Navigator Encounter Type Appt/Treatment Plan Review  Patient Visit Type MedOnc  Treatment Phase Post-Tx Follow-up  Barriers/Navigation Needs No Barriers At This Time  Acuity Level 1-No Barriers  Time Spent with Patient 15

## 2022-07-25 ENCOUNTER — Other Ambulatory Visit: Payer: Self-pay | Admitting: Internal Medicine

## 2022-07-25 ENCOUNTER — Telehealth: Payer: Self-pay | Admitting: Radiation Oncology

## 2022-07-25 NOTE — Telephone Encounter (Signed)
LVM to f/u, as requested by pt, after most recent f/u with surgeon. Call was to determine if she is able to schedule FUN/SIM with Dr. Roselind Messier.

## 2022-07-26 ENCOUNTER — Ambulatory Visit: Payer: Medicare Other | Admitting: Endocrinology

## 2022-07-26 ENCOUNTER — Telehealth: Payer: Self-pay | Admitting: Radiation Oncology

## 2022-07-26 NOTE — Telephone Encounter (Signed)
LVM to schedule FUN with Dr. Roselind Messier

## 2022-07-27 ENCOUNTER — Telehealth: Payer: Self-pay | Admitting: Radiation Oncology

## 2022-07-27 NOTE — Telephone Encounter (Signed)
LVM on home number to f/u with pt and possibly schedule consultation.

## 2022-07-31 ENCOUNTER — Telehealth: Payer: Self-pay | Admitting: Radiation Oncology

## 2022-07-31 NOTE — Telephone Encounter (Signed)
LVM to schedule FUN/SIM with Dr. Roselind Messier

## 2022-08-01 ENCOUNTER — Telehealth: Payer: Self-pay | Admitting: Radiation Oncology

## 2022-08-01 NOTE — Telephone Encounter (Signed)
LVM on home phone for c/b. Unable to LVM on mobile number.

## 2022-08-03 ENCOUNTER — Other Ambulatory Visit: Payer: Self-pay | Admitting: Hematology & Oncology

## 2022-08-03 ENCOUNTER — Other Ambulatory Visit: Payer: Self-pay

## 2022-08-03 DIAGNOSIS — C9110 Chronic lymphocytic leukemia of B-cell type not having achieved remission: Secondary | ICD-10-CM

## 2022-08-03 MED ORDER — VENETOCLAX 100 MG PO TABS
100.0000 mg | ORAL_TABLET | Freq: Every day | ORAL | 3 refills | Status: DC
Start: 2022-08-03 — End: 2022-10-30
  Filled 2022-08-03: qty 28, 28d supply, fill #0
  Filled 2022-08-30: qty 28, 28d supply, fill #1
  Filled 2022-09-28: qty 28, 28d supply, fill #2

## 2022-08-08 ENCOUNTER — Other Ambulatory Visit (HOSPITAL_COMMUNITY): Payer: Self-pay

## 2022-08-08 ENCOUNTER — Other Ambulatory Visit: Payer: Self-pay

## 2022-08-15 ENCOUNTER — Telehealth: Payer: Self-pay | Admitting: Endocrinology

## 2022-08-15 NOTE — Telephone Encounter (Signed)
Patient needs a refill on her Free Style Libra 3 sensor she was unable to keep last appointment due to a death in family. She has an appointment with Dr. Erroll Luna in August 29th. Please call patient and advise.

## 2022-08-15 NOTE — Telephone Encounter (Signed)
Patient order has been sent through [facility].

## 2022-08-17 ENCOUNTER — Other Ambulatory Visit: Payer: Self-pay

## 2022-08-18 DIAGNOSIS — D0512 Intraductal carcinoma in situ of left breast: Secondary | ICD-10-CM | POA: Diagnosis not present

## 2022-08-18 DIAGNOSIS — Z9889 Other specified postprocedural states: Secondary | ICD-10-CM | POA: Diagnosis not present

## 2022-08-22 ENCOUNTER — Other Ambulatory Visit: Payer: Self-pay | Admitting: Internal Medicine

## 2022-08-22 ENCOUNTER — Inpatient Hospital Stay: Payer: Medicare Other | Attending: Hematology & Oncology

## 2022-08-22 ENCOUNTER — Inpatient Hospital Stay (HOSPITAL_BASED_OUTPATIENT_CLINIC_OR_DEPARTMENT_OTHER): Payer: Medicare Other | Admitting: Hematology & Oncology

## 2022-08-22 ENCOUNTER — Encounter: Payer: Self-pay | Admitting: Hematology & Oncology

## 2022-08-22 ENCOUNTER — Encounter: Payer: Self-pay | Admitting: *Deleted

## 2022-08-22 VITALS — BP 151/63 | HR 72 | Temp 98.0°F | Resp 18 | Wt 169.0 lb

## 2022-08-22 DIAGNOSIS — D0512 Intraductal carcinoma in situ of left breast: Secondary | ICD-10-CM | POA: Diagnosis not present

## 2022-08-22 DIAGNOSIS — D509 Iron deficiency anemia, unspecified: Secondary | ICD-10-CM | POA: Insufficient documentation

## 2022-08-22 DIAGNOSIS — Z85038 Personal history of other malignant neoplasm of large intestine: Secondary | ICD-10-CM | POA: Insufficient documentation

## 2022-08-22 DIAGNOSIS — C9110 Chronic lymphocytic leukemia of B-cell type not having achieved remission: Secondary | ICD-10-CM

## 2022-08-22 DIAGNOSIS — C179 Malignant neoplasm of small intestine, unspecified: Secondary | ICD-10-CM

## 2022-08-22 DIAGNOSIS — Q999 Chromosomal abnormality, unspecified: Secondary | ICD-10-CM | POA: Diagnosis not present

## 2022-08-22 LAB — CBC WITH DIFFERENTIAL (CANCER CENTER ONLY)
Abs Immature Granulocytes: 0.08 10*3/uL — ABNORMAL HIGH (ref 0.00–0.07)
Basophils Absolute: 0 10*3/uL (ref 0.0–0.1)
Basophils Relative: 0 %
Eosinophils Absolute: 0 10*3/uL (ref 0.0–0.5)
Eosinophils Relative: 0 %
HCT: 33.9 % — ABNORMAL LOW (ref 36.0–46.0)
Hemoglobin: 11 g/dL — ABNORMAL LOW (ref 12.0–15.0)
Immature Granulocytes: 2 %
Lymphocytes Relative: 26 %
Lymphs Abs: 1.4 10*3/uL (ref 0.7–4.0)
MCH: 32.4 pg (ref 26.0–34.0)
MCHC: 32.4 g/dL (ref 30.0–36.0)
MCV: 99.7 fL (ref 80.0–100.0)
Monocytes Absolute: 0.6 10*3/uL (ref 0.1–1.0)
Monocytes Relative: 11 %
Neutro Abs: 3.3 10*3/uL (ref 1.7–7.7)
Neutrophils Relative %: 61 %
Platelet Count: 176 10*3/uL (ref 150–400)
RBC: 3.4 MIL/uL — ABNORMAL LOW (ref 3.87–5.11)
RDW: 13.1 % (ref 11.5–15.5)
WBC Count: 5.4 10*3/uL (ref 4.0–10.5)
nRBC: 0 % (ref 0.0–0.2)

## 2022-08-22 LAB — FERRITIN: Ferritin: 118 ng/mL (ref 11–307)

## 2022-08-22 LAB — RETICULOCYTES
Immature Retic Fract: 10.9 % (ref 2.3–15.9)
RBC.: 3.38 MIL/uL — ABNORMAL LOW (ref 3.87–5.11)
Retic Count, Absolute: 72.7 10*3/uL (ref 19.0–186.0)
Retic Ct Pct: 2.2 % (ref 0.4–3.1)

## 2022-08-22 LAB — CMP (CANCER CENTER ONLY)
ALT: 15 U/L (ref 0–44)
AST: 19 U/L (ref 15–41)
Albumin: 4 g/dL (ref 3.5–5.0)
Alkaline Phosphatase: 58 U/L (ref 38–126)
Anion gap: 5 (ref 5–15)
BUN: 19 mg/dL (ref 8–23)
CO2: 28 mmol/L (ref 22–32)
Calcium: 8.9 mg/dL (ref 8.9–10.3)
Chloride: 109 mmol/L (ref 98–111)
Creatinine: 1.39 mg/dL — ABNORMAL HIGH (ref 0.44–1.00)
GFR, Estimated: 38 mL/min — ABNORMAL LOW (ref >60)
Glucose, Bld: 96 mg/dL (ref 70–99)
Potassium: 4.4 mmol/L (ref 3.5–5.1)
Sodium: 142 mmol/L (ref 135–145)
Total Bilirubin: 0.2 mg/dL — ABNORMAL LOW (ref 0.3–1.2)
Total Protein: 6.6 g/dL (ref 6.5–8.1)

## 2022-08-22 LAB — IRON AND IRON BINDING CAPACITY (CC-WL,HP ONLY)
Iron: 98 ug/dL (ref 28–170)
Saturation Ratios: 34 % — ABNORMAL HIGH (ref 10.4–31.8)
TIBC: 291 ug/dL (ref 250–450)
UIBC: 193 ug/dL (ref 148–442)

## 2022-08-22 LAB — LACTATE DEHYDROGENASE: LDH: 164 U/L (ref 98–192)

## 2022-08-22 NOTE — Progress Notes (Signed)
Patient has decided that she will proceed with radiation. She has her follow up and simulation scheduled for 08/24/2022.  Oncology Nurse Navigator Documentation     08/22/2022   12:45 PM  Oncology Nurse Navigator Flowsheets  Phase of Treatment Radiation  Navigator Follow Up Date: 10/30/2022  Navigator Follow Up Reason: Follow-up Appointment  Navigator Location [facility]  Navigator Encounter Type Appt/Treatment Plan Review  Patient Visit Type MedOnc  Treatment Phase Active Tx  Barriers/Navigation Needs No Barriers At This Time  Interventions None Required  Acuity Level 1-No Barriers  Time Spent with Patient 15

## 2022-08-22 NOTE — Progress Notes (Signed)
Hematology and Oncology Follow Up Visit  [name] [id number] [birth year] 82 y.o. 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  Unfortunately, her son did pass away in [place] from cancer.  I just feel bad for her.  This happened in late July.  [name] still getting over this.  I know it has been very difficult.  Thankfully, [name] does have good family to support her.  [name] has had no problems with respect to the DCIS.  Hopefully, [name] is done with surgery.  [name] goes back to see the Radiation Therapist on Thursday to see about local radiation.  I do not think this would be a bad idea.  [name] is worried that this could affect her bones.  [name] had a bone density test done.  This showed osteoporosis.  [name] is going to see her Endocrinologist about this..  [name] does have the CLL.  Again [name] is off treatment for this right now.  [name] is off treatment now for 6 months.  [name] is doing well.  Currently, I would say that her performance status is probably ECOG 1.      Medications:  Allergies as of 08/22/2022       Reactions   Codeine Other (See Comments)   REACTION: makes her nervous, orTylenol #3   Ibuprofen Other (See Comments)   REACTION: nervous   Meperidine Hcl Nausea And Vomiting   Naproxen Sodium Other (See Comments)   Or Advil REACTION: nervous   Tramadol Other (See Comments)   Insomnia    Tylenol With Codeine #3 [acetaminophen-codeine] Nausea And Vomiting        Medication List        Accurate as of August 22, 2022  1:03 PM. If you  have any questions, ask your nurse or doctor.          acetaminophen 500 MG tablet Commonly known as: TYLENOL Take 1 tablet (500 mg total) by mouth every 6 (six) hours as needed.   amLODipine 5 MG tablet Commonly known as: NORVASC TAKE 1 TABLET(5 MG) BY MOUTH DAILY   aspirin 81 MG chewable tablet Chew 1 tablet (81 mg total) by mouth daily.   atenolol 100 MG tablet Commonly known as: TENORMIN Take 1 tablet (100 mg total) by mouth daily. What changed:  how much to take how to take this when to take this additional instructions Changed by: Willow Ora   atorvastatin 20 MG tablet Commonly known as: LIPITOR TAKE 1 TABLET(20 MG) BY MOUTH DAILY   cholecalciferol 1000 units tablet Commonly known as: VITAMIN D Take 1,000 Units by mouth daily.   clopidogrel 75 MG tablet Commonly known as: PLAVIX Take 1 tablet (75 mg total) by mouth daily.   diazepam 5 MG tablet Commonly known as: VALIUM Take 1 tablet (5 mg total) by mouth every 6 (six) hours as needed for anxiety.   fluticasone 50 MCG/ACT nasal spray Commonly known as: FLONASE SHAKE LIQUID AND USE 2 SPRAYS IN EACH NOSTRIL DAILY   insulin NPH Human 100 UNIT/ML injection Commonly known  as: NOVOLIN N Inject 24-36 Units into the skin See admin instructions. Inject 36 units into the skin every morning and inject 24 units into the skin at bedtime.   insulin regular 100 units/mL injection Commonly known as: NOVOLIN R Inject 10 Units into the skin 2 (two) times daily before a meal.   INSULIN SYRINGE .5CC/31GX5/16" 31G X 5/16" 0.5 ML Misc USE TO INJECT INSULIN 5 TIMES PER DAY   irbesartan 300 MG tablet Commonly known as: AVAPRO TAKE 1/2 TABLET(150 MG) BY MOUTH DAILY   isosorbide mononitrate 30 MG 24 hr tablet Commonly known as: IMDUR TAKE 1 TABLET BY MOUTH EVERY DAY   metFORMIN 500 MG tablet Commonly known as: GLUCOPHAGE TAKE 1 TABLET(500 MG) BY MOUTH DAILY WITH SUPPER   mometasone 0.1 % cream Commonly known as:  ELOCON Apply 1 Application topically daily as needed (itchy ears). Use as directed   multivitamin capsule Take 1 capsule by mouth daily.   omeprazole 20 MG capsule Commonly known as: PRILOSEC TAKE 1 CAPSULE(20 MG) BY MOUTH DAILY   ondansetron 4 MG tablet Commonly known as: Zofran Take 1 tablet (4 mg total) by mouth every 8 (eight) hours as needed for nausea or vomiting.   OneTouch Ultra test strip Generic drug: glucose blood USE AS DIRECTED THREE TIMES DAILY   oxyCODONE 5 MG immediate release tablet Commonly known as: Oxy IR/ROXICODONE Take 1 tablet (5 mg total) by mouth every 6 (six) hours as needed for severe pain.   Ozempic (0.25 or 0.5 MG/DOSE) 2 MG/3ML Sopn Generic drug: Semaglutide(0.25 or 0.5MG /DOS) Inject 0.5 mg into the skin every Monday.   Venclexta 100 MG tablet Generic drug: venetoclax Take 1 tablet (100 mg total) by mouth daily. Tablets should be swallowed whole with a meal and a full glass of water.        Allergies:  Allergies  Allergen Reactions   Codeine Other (See Comments)    REACTION: makes her nervous, orTylenol #3   Ibuprofen Other (See Comments)    REACTION: nervous   Meperidine Hcl Nausea And Vomiting   Naproxen Sodium Other (See Comments)    Or Advil  REACTION: nervous   Tramadol Other (See Comments)    Insomnia    Tylenol With Codeine #3 [Acetaminophen-Codeine] Nausea And Vomiting    Past Medical History, Surgical history, Social history, and Family History were reviewed and updated.  Review of Systems: Review of Systems  Constitutional: Negative.   HENT: Negative.    Eyes: Negative.   Respiratory: Negative.    Cardiovascular: Negative.   Gastrointestinal: Negative.   Genitourinary: Negative.   Musculoskeletal: Negative.   Skin: Negative.   Neurological: Negative.   Endo/Heme/Allergies: Negative.   Psychiatric/Behavioral: Negative.       Physical Exam:  weight is 169 lb 0.6 oz (76.7 kg). Her oral temperature is 98 F  (36.7 C). Her blood pressure is 151/63 (abnormal) and her pulse is 72. Her respiration is 18 and oxygen saturation is 100%.   Wt Readings from Last 3 Encounters:  08/22/22 169 lb 0.6 oz (76.7 kg)  07/17/22 165 lb (74.8 kg)  07/07/22 170 lb (77.1 kg)    Physical Exam Vitals reviewed.  Constitutional:      Comments: Her breast exam shows right breast no masses, edema or erythema.  There is no right axillary adenopathy.  Left breast shows the lumpectomy scar at the rim of the areola.  This is at about the 12 o'clock position.  This is healing.  There is a  little bit of ecchymoses.  There is a little bit of tenderness.  There is no erythema or swelling.  There is no left axillary adenopathy.  HENT:     Head: Normocephalic and atraumatic.  Eyes:     Pupils: Pupils are equal, round, and reactive to light.  Cardiovascular:     Rate and Rhythm: Normal rate and regular rhythm.     Heart sounds: Normal heart sounds.  Pulmonary:     Effort: Pulmonary effort is normal.     Breath sounds: Normal breath sounds.  Abdominal:     General: Bowel sounds are normal.     Palpations: Abdomen is soft.  Musculoskeletal:        General: No tenderness or deformity. Normal range of motion.     Cervical back: Normal range of motion.  Lymphadenopathy:     Cervical: No cervical adenopathy.  Skin:    General: Skin is warm and dry.     Findings: No erythema or rash.  Neurological:     Mental Status: [name] is alert and oriented to person, place, and time.  Psychiatric:        Behavior: Behavior normal.        Thought Content: Thought content normal.        Judgment: Judgment normal.      Lab Results  Component Value Date   WBC 5.4 08/22/2022   HGB 11.0 (L) 08/22/2022   HCT 33.9 (L) 08/22/2022   MCV 99.7 08/22/2022   PLT 176 08/22/2022   Lab Results  Component Value Date   FERRITIN 111 07/07/2022   IRON 87 07/07/2022   TIBC 315 07/07/2022   UIBC 228 07/07/2022   IRONPCTSAT 28 07/07/2022   Lab  Results  Component Value Date   RETICCTPCT 2.2 08/22/2022   RBC 3.38 (L) 08/22/2022   Lab Results  Component Value Date   KPAFRELGTCHN 32.8 (H) 05/16/2021   LAMBDASER 26.4 (H) 05/16/2021   KAPLAMBRATIO 1.24 05/16/2021   Lab Results  Component Value Date   IGGSERUM 900 02/20/2022   IGA 181 02/20/2022   IGMSERUM 19 (L) 02/20/2022   Lab Results  Component Value Date   TOTALPROTELP 6.5 11/18/2020   ALBUMINELP 3.6 11/18/2020   A1GS 0.3 11/18/2020   A2GS 0.9 11/18/2020   BETS 1.0 11/18/2020   BETA2SER 6.2 02/25/2014   GAMS 0.7 11/18/2020   MSPIKE Not Observed 11/18/2020   SPEI * 02/25/2014     Chemistry      Component Value Date/Time   NA 142 08/22/2022 1114   NA 147 (H) 11/03/2016 1018   NA 143 08/30/2016 0954   K 4.4 08/22/2022 1114   K 3.9 11/03/2016 1018   K 4.5 08/30/2016 0954   CL 109 08/22/2022 1114   CL 110 (H) 11/03/2016 1018   CO2 28 08/22/2022 1114   CO2 27 11/03/2016 1018   CO2 24 08/30/2016 0954   BUN 19 08/22/2022 1114   BUN 25 (H) 11/03/2016 1018   BUN 20.5 08/30/2016 0954   CREATININE 1.39 (H) 08/22/2022 1114   CREATININE 1.6 (H) 11/03/2016 1018   CREATININE 1.4 (H) 08/30/2016 0954      Component Value Date/Time   CALCIUM 8.9 08/22/2022 1114   CALCIUM 10.1 11/03/2016 1018   CALCIUM 9.8 08/30/2016 0954   ALKPHOS 58 08/22/2022 1114   ALKPHOS 112 (H) 11/03/2016 1018   ALKPHOS 113 08/30/2016 0954   AST 19 08/22/2022 1114   AST 23 08/30/2016 0954   ALT 15 08/22/2022  1114   ALT 29 11/03/2016 1018   ALT 20 08/30/2016 0954   BILITOT 0.2 (L) 08/22/2022 1114   BILITOT 0.38 08/30/2016 0954      Impression and Plan: [name] is a very pleasant 82 yo caucasian female with CLL.  So far, we have not had any negative prognostic markers.  [name] does have the 13q-chromosomal abnormality.  This is typically construed as a good prognostic marker.  [name] now has  DCIS of the left breast.  [name] has had multiple surgeries.  [name] had a excision after her positive  margin.  Thankfully, the reexcision did not show any malignancy.  I think if [name] wants radiation, I have no problems with this.  I do not think [name] is any aromatase inhibitor.  I think my point of view, we will still have to focus on the CLL in the long-term.  I will plan to get her back to see me in another 2 months.   Sara Macho, MD 8/20/20241:03 PM

## 2022-08-23 ENCOUNTER — Telehealth: Payer: Self-pay

## 2022-08-23 ENCOUNTER — Ambulatory Visit: Payer: Medicare Other | Admitting: Radiation Oncology

## 2022-08-23 ENCOUNTER — Ambulatory Visit: Payer: Medicare Other

## 2022-08-23 NOTE — Telephone Encounter (Signed)
Called and informed patient of results, patient verbalized understanding and denies any questions or concerns at this time.  ? ?

## 2022-08-23 NOTE — Telephone Encounter (Signed)
-----   Message from Josph Macho sent at 08/23/2022  5:36 AM EDT ----- Call - the iron level is ok!!!  Cindee Lame

## 2022-08-23 NOTE — Telephone Encounter (Signed)
Rn called pt to obtain meaningful use and nurse evaluation information. When RN spoke with pt she expressed that she did not want radiation treatment at this time. Pt reports her son just passed away about a month ago with stage 4 small cell lung cancer. She just got back from [place] last week for this purpose. She also expressed to RN that she is very tired and overwhelmed with so many appointments (with so many doctors). She is also feels like she needs more to process the idea of radiation overall. She also expressed concerns for her bone health and radiation effects. She recently had studies show that her osteoporosis was getting worse and she is concerned radiation will make it worse. She also told RN that she felt since the cancer was caught so early radiation was not necessary at this time. Rn encouraged pt to come in for appointment to hear the information on radiation treatment. However the pt did not feel like she was in a good place to do this overall at this time and would stated she would let Dr. Myna Hidalgo know if she changed her mind. Rn encouraged pt that the radiation oncology team and Dr. Roselind Messier will be here if she were to change her mind. Rn Will reach out to the admin team to cancel her appointments for tomorrow.

## 2022-08-24 ENCOUNTER — Ambulatory Visit: Payer: Medicare Other | Admitting: Radiation Oncology

## 2022-08-24 ENCOUNTER — Ambulatory Visit: Payer: Medicare Other

## 2022-08-24 LAB — IGG, IGA, IGM
IgA: 185 mg/dL (ref 64–422)
IgG (Immunoglobin G), Serum: 845 mg/dL (ref 586–1602)
IgM (Immunoglobulin M), Srm: 37 mg/dL (ref 26–217)

## 2022-08-30 ENCOUNTER — Other Ambulatory Visit (HOSPITAL_COMMUNITY): Payer: Self-pay

## 2022-08-31 ENCOUNTER — Encounter: Payer: Self-pay | Admitting: Endocrinology

## 2022-08-31 ENCOUNTER — Ambulatory Visit (INDEPENDENT_AMBULATORY_CARE_PROVIDER_SITE_OTHER): Payer: Medicare Other | Admitting: Endocrinology

## 2022-08-31 ENCOUNTER — Telehealth: Payer: Self-pay | Admitting: Endocrinology

## 2022-08-31 ENCOUNTER — Telehealth: Payer: Self-pay

## 2022-08-31 VITALS — BP 165/70 | HR 68 | Ht 66.0 in | Wt 169.6 lb

## 2022-08-31 DIAGNOSIS — T383X5A Adverse effect of insulin and oral hypoglycemic [antidiabetic] drugs, initial encounter: Secondary | ICD-10-CM

## 2022-08-31 DIAGNOSIS — N1832 Chronic kidney disease, stage 3b: Secondary | ICD-10-CM

## 2022-08-31 DIAGNOSIS — Z794 Long term (current) use of insulin: Secondary | ICD-10-CM | POA: Diagnosis not present

## 2022-08-31 DIAGNOSIS — E1122 Type 2 diabetes mellitus with diabetic chronic kidney disease: Secondary | ICD-10-CM | POA: Diagnosis not present

## 2022-08-31 DIAGNOSIS — E160 Drug-induced hypoglycemia without coma: Secondary | ICD-10-CM | POA: Diagnosis not present

## 2022-08-31 DIAGNOSIS — Z7984 Long term (current) use of oral hypoglycemic drugs: Secondary | ICD-10-CM | POA: Diagnosis not present

## 2022-08-31 DIAGNOSIS — Z7985 Long-term (current) use of injectable non-insulin antidiabetic drugs: Secondary | ICD-10-CM

## 2022-08-31 DIAGNOSIS — M810 Age-related osteoporosis without current pathological fracture: Secondary | ICD-10-CM | POA: Diagnosis not present

## 2022-08-31 LAB — POCT GLYCOSYLATED HEMOGLOBIN (HGB A1C): Hemoglobin A1C: 5 % (ref 4.0–5.6)

## 2022-08-31 LAB — RENAL FUNCTION PANEL
Albumin: 3.9 g/dL (ref 3.5–5.2)
BUN: 16 mg/dL (ref 6–23)
CO2: 28 meq/L (ref 19–32)
Calcium: 9.6 mg/dL (ref 8.4–10.5)
Chloride: 108 meq/L (ref 96–112)
Creatinine, Ser: 1.25 mg/dL — ABNORMAL HIGH (ref 0.40–1.20)
GFR: 40 mL/min — ABNORMAL LOW (ref >60.00)
Glucose, Bld: 43 mg/dL — CL (ref 70–99)
Phosphorus: 3.1 mg/dL (ref 2.3–4.6)
Potassium: 4.6 meq/L (ref 3.5–5.1)
Sodium: 144 mEq/L (ref 135–145)

## 2022-08-31 MED ORDER — INSULIN NPH (HUMAN) (ISOPHANE) 100 UNIT/ML ~~LOC~~ SUSP: SUBCUTANEOUS | 3 refills | Status: DC

## 2022-08-31 MED ORDER — INSULIN REGULAR HUMAN 100 UNIT/ML IJ SOLN: 8.0000 [IU] | Freq: Two times a day (BID) | INTRAMUSCULAR | 3 refills | Status: DC

## 2022-08-31 NOTE — Patient Instructions (Addendum)
Take calcium 500 mg daily. It is over the counter. In addition to your multivitamin and Vit D.  Diabetes regimen: Decrease Novolin N to 28 units in the morning and 16 units in the evening with meals. Decrease regular insulin to 8 units two times a day with meal. 3.   Continue ozempic and metformin same.  Take glucose tablet take 2-4 tablets when glucose is low. It is over the counter.  Labs today.    Call me in 2 weeks to review, if you still low sugar.

## 2022-08-31 NOTE — Progress Notes (Signed)
Outpatient Endocrinology Note [place] Dovber Ernest, MD  08/31/22  Patient's Name: [name]    DOB: 07-18-1940    MRN: [id number]                                                    REASON OF VISIT: Follow up for type 2 diabetes mellitus  REFERRING PROVIDER:   PCP: Wanda Plump, MD  HISTORY OF PRESENT ILLNESS:   [name] is a 82 y.o. old female with past medical history listed below, is here for follow up of type 2 diabetes mellitus. She has new referral to endocrinology for osteoporosis.   Pertinent Diabetes History: Patient was diagnosed with type 2 diabetes mellitus in 1998.  Patient was initially treated with metformin and Amaryl was added later on.  She was started on insulin therapy in 2014.  She had been on Victoza in the past.  Chronic Diabetes Complications : Retinopathy: yes following with ophthalmology.   Nephropathy: CKD 3B, on irbesartan Peripheral neuropathy: no Coronary artery disease: yes, CABG Stroke: no  Relevant comorbidities and cardiovascular risk factors: Obesity: no Body mass index is 27.37 kg/m.  Hypertension: yes Hyperlipidemia. Yes, on a statin  Current / Home Diabetic regimen includes: Novolin N 36 units in the morning and 24 units in the evening. Regular insulin 10 units with breakfast and 10 units with supper. Metformin 500 mg daily. Ozempic 0.5 mg weekly.  Prior diabetic medications: Victoza in the past, changed to Ozempic due to insurance preference in ~ May 2024.  Glycemic data: She has restarted freestyle libre 3 CGM from May 2024.  CONTINUOUS GLUCOSE MONITORING SYSTEM (CGMS) INTERPRETATION: At today's visit, we reviewed CGM downloads. The full report is scanned in the media. Reviewing the CGM trends, blood glucose are as follows:  FreeStyle Libre 3 CGM-  Sensor Download (Sensor download was reviewed and summarized below.) Dates: August 16 to August 31, 2022, 14 days. Sensor Average: 106  Glucose Management Indicator:  5.8% Glucose Variability: 29.6%  % data captured: 91%  Glycemic Trends:  <54: 1% 54-70: 14% 71-180: 83% 181-250: 2% 251-400: 0%  Interpretation: -Patient is having significant hypoglycemia mostly overnight and early morning with blood sugar in upper 50s to 60s.  She is also having occasional hypoglycemia in the afternoon and at bedtime with blood sugar at 60s.  Most of the blood sugar in the afternoon are acceptable.  No significant hyperglycemia.  Rarely she had blood sugar in the low 200s.  Hypoglycemia: Patient has frequent hypoglycemic episodes. Patient has hypoglycemia awareness.  Factors modifying glucose control: 1.  Diabetic diet assessment: 2-3 meals a day, she sometimes does not eat dinner.  At suppertime she usually does like to snack.  2.  Staying active or exercising: No formal exercise.  3.  Medication compliance: compliant all of the time.  # Osteoporosis : -Osteoporosis was being managed by primary care provider and now referred to endocrinology after her DEXA scan in July 18, 2022.  -DEXA scan in July 18, 2022 T-score AP spine L1-L4 -0.7  and Right hip total -2.7 consistent with osteoporosis. ( Left hip not used due to surgery)  DEX scan in 12/2015, T-Score: AP spine -0.8, RFN -2.5, LFN -2.9 consistent with osteoporosis. (GE lunar)  Patient had remote history of foot fracture due to walking.  She had a history  of left hip fracture in August 2020, started on Fosamax and later switched to Prolia due to CKD and difficulty swallowing, first dose of Prolia in December 2021, she has been Prolia every 6 months without missing and last dose was in July 2024.  She is currently taking vitamin D3 1000 international unit daily, multivitamin 1 tablet daily.  She is not taking additional calcium.  She eats yogurt daily and fair amount of cheese.  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  During the chemotherapy course she had received multiple  course of dexamethasone, which is on 6 factor for continued bone loss.  Patient has remote history of colon cancer. DCIS left breast.   Interval history 08/31/22 She has been taking Novolin N 36 units in the morning and 24 units in the evening.  Regular insulin she always takes with the breakfast 10 unit and in the suppertime she only takes when she is eating larger meal.  She does not take regular insulin in the evening when she usually snacks.  CGM data as reviewed above.  She is frequently having hypoglycemia especially overnight and early morning with blood sugar up to about 50-60 range.  No concerning hypoglycemia.  Most of the blood sugar in the afternoon are acceptable.  Patient had DEXA scan last month consistent with osteoporosis with lowest T-score of -2.7 at right hip, T-score at lumbar spine is -0.7.  Patient has no fracture.  She has been taking vitamin D supplement and multivitamin.  No additional calcium supplement.  No recent fall and fracture.  Patient is referred to endocrinology for evaluation and management of osteoporosis as well.  REVIEW OF SYSTEMS As per history of present illness.   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   Gastritis    GERD (gastroesophageal reflux disease)    Headache(784.0)    History of blood transfusion    "I"ve had 20 some; thru birth of children, loss of blood, last 2 were in ~ 1999 before my cancer surgery" (09/16/2015)   History of hiatal hernia    History of shingles    Hx of colonic  polyps    Hyperlipidemia    Hypertension    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HCC) 2000   Myocardial infarction (HCC)    1997   Pneumonia    X 1   Postoperative hematoma involving circulatory system following cardiac catheterization 09/17/2015   S/P cardiac cath 09/16/15 09/17/2015   Type II diabetes mellitus (HCC)    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[facility])    as a child   Venous insufficiency     PAST SURGICAL HISTORY: Past Surgical History:  Procedure Laterality Date   BREAST BIOPSY Left 03/10/2022   MM LT BREAST BX W LOC DEV 1ST LESION IMAGE BX SPEC STEREO GUIDE 03/10/2022 [facility]   BREAST BIOPSY  05/01/2022   MM LT RADIOACTIVE  SEED LOC MAMMO GUIDE 05/01/2022 [facility]   BREAST LUMPECTOMY WITH RADIOACTIVE SEED LOCALIZATION Left 05/02/2022   Procedure: LEFT BREAST LUMPECTOMY WITH RADIOACTIVE SEED LOCALIZATION;  Surgeon: Harriette Bouillon, MD;  Location: [facility] OR;  Service: General;  Laterality: Left;   CARDIAC CATHETERIZATION  1997; 2008; 09/16/2015   CARDIAC CATHETERIZATION N/A 09/16/2015   Procedure: Right/Left Heart Cath and Coronary/Graft Angiography;  Surgeon: Runell Gess, MD;  Location: [facility] INVASIVE [facility];  Service: Cardiovascular;  Laterality: N/A;   CESAREAN SECTION  1966   COLONOSCOPY     CORONARY ARTERY BYPASS GRAFT  1997   CABG X5   EYE SURGERY     2 laser surgeries on left eye with implant   EYE SURGERY Bilateral    cataracts   FEMUR IM NAIL Left 08/28/2018   Procedure: INTRAMEDULLARY (IM) NAIL FEMORAL;  Surgeon: Samson Frederic, MD;  Location: [facility];  Service: Orthopedics;  Laterality: Left;   FRACTURE SURGERY     HERNIA REPAIR     LAPAROSCOPIC ASSISTED VENTRAL HERNIA REPAIR  09/2008    with incarcerated colon;  Dr. 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 Samuella Cota   SHOULDER ARTHROSCOPY W/ ROTATOR CUFF REPAIR Right 1997   SMALL INTESTINE SURGERY     TONSILLECTOMY  1960s   TOTAL KNEE ARTHROPLASTY WITH HARDWARE REMOVAL Left 12/1994   (infex, hardware removed )- not replacement per patient   TUBAL LIGATION  1966    ALLERGIES: Allergies  Allergen Reactions   Codeine Other (See Comments)    REACTION: makes her nervous, orTylenol #3   Ibuprofen Other (See Comments)    REACTION: nervous   Meperidine Hcl Nausea And Vomiting   Naproxen Sodium Other (See Comments)    Or Advil  REACTION: nervous   Tramadol Other (See Comments)    Insomnia    Tylenol With Codeine #3 [Acetaminophen-Codeine] Nausea And Vomiting    FAMILY HISTORY:  Family History  Problem Relation Age of Onset   Heart disease Mother 18   Heart disease Father 32       MI   Hypertension Child    Heart attack Child    Diabetes Child    Heart disease Maternal Aunt        x 2, all deceased   Heart disease Maternal Uncle        x 4, all deceased   Emphysema Brother 35   Colon cancer Neg Hx    Breast cancer Neg Hx    Rectal cancer Neg Hx    Stomach cancer Neg Hx     SOCIAL HISTORY: Social History   Socioeconomic History   Marital status: Widowed    Spouse name: Not on file   Number of children: 2   Years of education: Not on file   Highest education level: Not on file  Occupational History   Occupation: retired-- [employer]     Employer: RETIRED  Tobacco Use   Smoking status: Never   Smokeless tobacco: Never  Vaping Use  Vaping status: Never Used  Substance and Sexual Activity   Alcohol use: No    Alcohol/week: 0.0 standard drinks of alcohol   Drug use: No   Sexual activity: Not  Currently  Other Topics Concern   Not on file  Social History Narrative   Lives by herself, lost husband ~ 2004 , still drives    1 child in [place]   I child in [place]   Social Determinants of Health   Financial Resource Strain: Low Risk  (06/29/2022)   Overall Financial Resource Strain (CARDIA)    Difficulty of Paying Living Expenses: Not hard at all  Food Insecurity: No Food Insecurity (06/29/2022)   Hunger Vital Sign    Worried About Running Out of Food in the Last Year: Never true    Ran Out of Food in the Last Year: Never true  Transportation Needs: No Transportation Needs (06/29/2022)   PRAPARE - Administrator, Civil Service (Medical): No    Lack of Transportation (Non-Medical): No  Physical Activity: Inactive (06/29/2022)   Exercise Vital Sign    Days of Exercise per Week: 0 days    Minutes of Exercise per Session: 0 min  Stress: No Stress Concern Present (06/29/2022)   [employer] of Occupational Health - Occupational Stress Questionnaire    Feeling of Stress : Only a little  Social Connections: Moderately Integrated (06/29/2022)   Social Connection and Isolation Panel [NHANES]    Frequency of Communication with Friends and Family: More than three times a week    Frequency of Social Gatherings with Friends and Family: More than three times a week    Attends Religious Services: More than 4 times per year    Active Member of [employer] or Organizations: Yes    Attends Banker Meetings: More than 4 times per year    Marital Status: Widowed    MEDICATIONS:  Current Outpatient Medications  Medication Sig Dispense Refill   acetaminophen (TYLENOL) 500 MG tablet Take 1 tablet (500 mg total) by mouth every 6 (six) hours as needed. 30 tablet 0   amLODipine (NORVASC) 5 MG tablet TAKE 1 TABLET(5 MG) BY MOUTH DAILY 90 tablet 1   aspirin 81 MG chewable tablet Chew 1 tablet (81 mg total) by mouth daily.     atenolol (TENORMIN) 100 MG tablet Take 1 tablet (100 mg  total) by mouth daily. 90 tablet 1   atorvastatin (LIPITOR) 20 MG tablet TAKE 1 TABLET(20 MG) BY MOUTH DAILY 90 tablet 3   cholecalciferol (VITAMIN D) 1000 UNITS tablet Take 1,000 Units by mouth daily.     clopidogrel (PLAVIX) 75 MG tablet Take 1 tablet (75 mg total) by mouth daily. 90 tablet 1   diazepam (VALIUM) 5 MG tablet Take 1 tablet (5 mg total) by mouth every 6 (six) hours as needed for anxiety. 20 tablet 0   fluticasone (FLONASE) 50 MCG/ACT nasal spray SHAKE LIQUID AND USE 2 SPRAYS IN EACH NOSTRIL DAILY 48 g 3   glucose blood (ONETOUCH ULTRA) test strip USE AS DIRECTED THREE TIMES DAILY 100 strip 5   Insulin Syringe-Needle U-100 (INSULIN SYRINGE .5CC/31GX5/16") 31G X 5/16" 0.5 ML MISC USE TO INJECT INSULIN 5 TIMES PER DAY 200 each 0   irbesartan (AVAPRO) 300 MG tablet TAKE 1/2 TABLET(150 MG) BY MOUTH DAILY 45 tablet 3   isosorbide mononitrate (IMDUR) 30 MG 24 hr tablet TAKE 1 TABLET BY MOUTH EVERY DAY 90 tablet 3   metFORMIN (GLUCOPHAGE) 500 MG tablet TAKE  1 TABLET(500 MG) BY MOUTH DAILY WITH SUPPER 90 tablet 1   mometasone (ELOCON) 0.1 % cream Apply 1 Application topically daily as needed (itchy ears). Use as directed     Multiple Vitamin (MULTIVITAMIN) capsule Take 1 capsule by mouth daily.     omeprazole (PRILOSEC) 20 MG capsule TAKE 1 CAPSULE(20 MG) BY MOUTH DAILY 90 capsule 3   ondansetron (ZOFRAN) 4 MG tablet Take 1 tablet (4 mg total) by mouth every 8 (eight) hours as needed for nausea or vomiting. 20 tablet 0   Semaglutide,0.25 or 0.5MG /DOS, (OZEMPIC, 0.25 OR 0.5 MG/DOSE,) 2 MG/3ML SOPN Inject 0.5 mg into the skin every Monday. 9 mL 0   venetoclax (VENCLEXTA) 100 MG tablet Take 1 tablet (100 mg total) by mouth daily. Tablets should be swallowed whole with a meal and a full glass of water. 28 tablet 3   insulin NPH Human (NOVOLIN N) 100 UNIT/ML injection Inject 28 units into the skin every morning and inject 16 units into the skin at bedtime. 10 mL 3   insulin regular (NOVOLIN R)  100 units/mL injection Inject 0.08 mLs (8 Units total) into the skin 2 (two) times daily before a meal. 10 mL 3   oxyCODONE (OXY IR/ROXICODONE) 5 MG immediate release tablet Take 1 tablet (5 mg total) by mouth every 6 (six) hours as needed for severe pain. (Patient not taking: Reported on 08/22/2022) 15 tablet 0   No current facility-administered medications for this visit.   Facility-Administered Medications Ordered in Other Visits  Medication Dose Route Frequency Provider Last Rate Last Admin   vancomycin (VANCOCIN) 1,000 mg in sodium chloride 0.9 % 500 mL IVPB  1,000 mg Intravenous Once Cornett, Thomas, MD        PHYSICAL EXAM: Vitals:   08/31/22 1021  BP: (!) 165/70  Pulse: 68  SpO2: 98%  Weight: 169 lb 9.6 oz (76.9 kg)  Height: 5\' 6"  (1.676 m)   Body mass index is 27.37 kg/m.  Wt Readings from Last 3 Encounters:  08/31/22 169 lb 9.6 oz (76.9 kg)  08/22/22 169 lb 0.6 oz (76.7 kg)  07/17/22 165 lb (74.8 kg)    General: Well developed, well nourished female in no apparent distress.  HEENT: AT/[place], no external lesions.  Eyes: Conjunctiva clear and no icterus. Neck: Neck supple  Lungs: Respirations not labored Neurologic: Alert, oriented, normal speech Extremities / Skin: Dry. No sores or rashes noted.  No spine tenderness. Psychiatric: Does not appear depressed or anxious  Diabetic Foot Exam - Simple   No data filed    LABS Reviewed Lab Results  Component Value Date   HGBA1C 5.0 08/31/2022   HGBA1C 5.2 04/26/2022   HGBA1C 4.6 02/15/2022   Lab Results  Component Value Date   FRUCTOSAMINE 205 05/11/2022   FRUCTOSAMINE 207 10/25/2021   FRUCTOSAMINE 230 06/28/2021   Lab Results  Component Value Date   CHOL 110 07/17/2022   HDL 37.20 (L) 07/17/2022   LDLCALC 41 05/05/2020   LDLDIRECT 38.0 07/17/2022   TRIG 262.0 (H) 07/17/2022   CHOLHDL 3 07/17/2022   Lab Results  Component Value Date   MICRALBCREAT 14.5 05/11/2022   MICRALBCREAT 12.7 12/09/2020   Lab  Results  Component Value Date   CREATININE 1.39 (H) 08/22/2022   Lab Results  Component Value Date   GFR 33.83 (L) 05/11/2022    ASSESSMENT / PLAN  1. Type 2 diabetes mellitus with stage 3b chronic kidney disease, with long-term current use of insulin (HCC)  2. Age-related osteoporosis without current pathological fracture   3. Long-term current use of injectable noninsulin antidiabetic medication   4. Long term (current) use of oral hypoglycemic drugs   5. Hypoglycemia due to insulin     Diabetes Mellitus type 2, complicated by CKD/ CAD - Diabetic status / severity: Controlled with hypoglycemia.  Lab Results  Component Value Date   HGBA1C 5.0 08/31/2022    - Hemoglobin A1c goal : <7%  Patient is having hemoglobin A1c in 5s, for several months, she is somewhat falsely low due to anemia however probably related with significant hypoglycemia as well.  Discussed that hypoglycemia need to be avoided, and in that case, it is okay to have hemoglobin A1c up to the range of 7% with adjustment of insulin regimen.  - Medications: Decrease insulin doses due to hypoglycemia. Diabetes regimen: Decrease Novolin N to 28 units in the morning and 16 units in the evening with meals. Decrease regular insulin to 8 units two times a day with meal. 3.   Continue ozempic 0.5 mg weekly  4.    Metformin 500 mg daily.  - Home glucose testing: Continue CGM/freestyle libre 3.  Check blood sugar as needed. - Discussed/ Gave Hypoglycemia treatment plan.  In detail about risks of hypoglycemia.  He is asked to get glucose tablet over-the-counter and take 2 to 4 tablets for correction of hypoglycemia.  Patient lives by herself not able to use Glucagon Emergency Kit.  # Consult : not required at this time.   # Annual urine for microalbuminuria/ creatinine ratio, no microalbuminuria currently, continue ACE/ARB /irbesartan. Last  Lab Results  Component Value Date   MICRALBCREAT 14.5 05/11/2022    #  Foot check nightly / neuropathy.  # Annual dilated diabetic eye exams.   2. Blood pressure  -  BP Readings from Last 1 Encounters:  08/31/22 (!) 165/70    - Control is in target.  - No change in current plans.  3. Lipid status / Hyperlipidemia - Last  Lab Results  Component Value Date   LDLCALC 41 05/05/2020   - Continue atorvastatin 20 mg daily.  # Osteoporosis -Patient had recent DEXA scan in July 2024 consistent with osteoporosis based on lowest T-score of -2.7 at right hip and T-score at lumbar spine -0.7.  She had DEXA scan in 2017 with lowest T-score of -2.9 at femoral neck and T-score of -0.8 at lumbar spine consistent with osteoporosis. -She was treated with Fosamax initially started in August 2020 and switched to Prolia.  First dose was in December 2021, she has received Prolia every 6 months and last dose was in July 2024. -She has no recent fracture. -Not able to compare bone density done in 2017 and bone density in July 2024 as they were done in different places.  However overall it seems to be stable, over the period of 7 years, despite she had received multiple doses of dexamethasone for the treatment of CLL in last 2 years which is a risk for bone loss.  Plan: -In this context, I do not think there is failure or less effectiveness of Prolia.  Due to CKD Prolia is actually preferred antiresorptive therapy. -I would recommend to continue Prolia 60 mg subcutaneous every 80-month. -Check bone mineral density in 2 years  -I would like to check renal function panel, PTH, vitamin D level. -I would like to check CTX as bone resorption marker. -Continue current dose of vitamin D.  Will check the vitamin D level  today and will advise accordingly. -Advised to take calcium 500 mg daily in addition to dietary calcium and multivitamin. -Discussed about fall precautions. -I routed this note to her PCP Dr. Drue Novel.   Diagnoses and all orders for this visit:  Type 2 diabetes mellitus  with stage 3b chronic kidney disease, with long-term current use of insulin (HCC) -     POCT glycosylated hemoglobin (Hb A1C)  Age-related osteoporosis without current pathological fracture -     Renal function panel; Future -     C-Terminal Telopeptide; Future -     Parathyroid hormone, intact (no Ca); Future -     VITAMIN D 25 Hydroxy (Vit-D Deficiency, Fractures); Future -     VITAMIN D 25 Hydroxy (Vit-D Deficiency, Fractures) -     Parathyroid hormone, intact (no Ca) -     C-Terminal Telopeptide -     Renal function panel  Long-term current use of injectable noninsulin antidiabetic medication  Long term (current) use of oral hypoglycemic drugs  Hypoglycemia due to insulin  Other orders -     insulin NPH Human (NOVOLIN N) 100 UNIT/ML injection; Inject 28 units into the skin every morning and inject 16 units into the skin at bedtime. -     insulin regular (NOVOLIN R) 100 units/mL injection; Inject 0.08 mLs (8 Units total) into the skin 2 (two) times daily before a meal.    I spent 64 minutes in this visit, with face to face encounter, medical records review, imagings review, documentation and discussion with patient about of findings, diagnosis, medications, management plan as above. All questions were answered.  DISPOSITION Follow up in clinic in 6 weeks suggested.   All questions answered and patient verbalized understanding of the plan.  Iraq Darius Lundberg, MD [facility] [address] [place], [address] [place], [place] [address] Phone # [phone]  At least part of this note was generated using voice recognition software. Inadvertent word errors may have occurred, which were not recognized during the proofreading process.

## 2022-08-31 NOTE — Telephone Encounter (Signed)
No further action required for now. Noted.

## 2022-08-31 NOTE — Telephone Encounter (Signed)
Lab is calling with a critical lab result.

## 2022-08-31 NOTE — Telephone Encounter (Signed)
I spoke to Dr Erroll Luna and reported critical low glucose of 43, and also called the patient and sent Dr Erroll Luna the conversation in a different phone note

## 2022-08-31 NOTE — Telephone Encounter (Signed)
Per Lab critical glucose of 43, calling speaking to Ms Pflaum asking if she has eaten since she left the office she states she ate a cheeseburger, sweet tea, small frosty from [place] and reading from her Josephine Igo now is 34, sending to Dr Erroll Luna to advise

## 2022-09-01 LAB — VITAMIN D 25 HYDROXY (VIT D DEFICIENCY, FRACTURES): VITD: 43.37 ng/mL (ref 30.00–100.00)

## 2022-09-03 LAB — C-TERMINAL TELOPEPTIDE: C-Telopeptide (CTx): 175 pg/mL

## 2022-09-03 LAB — PARATHYROID HORMONE, INTACT (NO CA): PTH: 60 pg/mL (ref 16–77)

## 2022-09-06 ENCOUNTER — Other Ambulatory Visit: Payer: Self-pay

## 2022-09-25 ENCOUNTER — Other Ambulatory Visit: Payer: Self-pay

## 2022-09-25 DIAGNOSIS — E1165 Type 2 diabetes mellitus with hyperglycemia: Secondary | ICD-10-CM

## 2022-09-25 MED ORDER — OZEMPIC (0.25 OR 0.5 MG/DOSE) 2 MG/3ML ~~LOC~~ SOPN: 0.5000 mg | PEN_INJECTOR | SUBCUTANEOUS | 0 refills | Status: DC

## 2022-09-28 ENCOUNTER — Other Ambulatory Visit (HOSPITAL_COMMUNITY): Payer: Self-pay

## 2022-09-28 ENCOUNTER — Other Ambulatory Visit: Payer: Self-pay

## 2022-09-28 NOTE — Progress Notes (Signed)
Specialty Pharmacy Refill Coordination Note  [name] is a 82 y.o. female contacted today regarding refills of specialty medication(s) Venetoclax .  Patient requested Delivery  on 10/05/22  to verified address [address] [name] [facility] [address]   Medication will be filled on 10/04/22.

## 2022-10-02 ENCOUNTER — Other Ambulatory Visit: Payer: Self-pay

## 2022-10-17 ENCOUNTER — Ambulatory Visit (INDEPENDENT_AMBULATORY_CARE_PROVIDER_SITE_OTHER): Payer: Medicare Other | Admitting: Endocrinology

## 2022-10-17 ENCOUNTER — Encounter: Payer: Self-pay | Admitting: Endocrinology

## 2022-10-17 VITALS — BP 132/78 | HR 63 | Resp 20 | Ht 65.0 in | Wt 171.2 lb

## 2022-10-17 DIAGNOSIS — N1832 Chronic kidney disease, stage 3b: Secondary | ICD-10-CM

## 2022-10-17 DIAGNOSIS — T383X5A Adverse effect of insulin and oral hypoglycemic [antidiabetic] drugs, initial encounter: Secondary | ICD-10-CM

## 2022-10-17 DIAGNOSIS — Z794 Long term (current) use of insulin: Secondary | ICD-10-CM

## 2022-10-17 DIAGNOSIS — M810 Age-related osteoporosis without current pathological fracture: Secondary | ICD-10-CM

## 2022-10-17 DIAGNOSIS — E1122 Type 2 diabetes mellitus with diabetic chronic kidney disease: Secondary | ICD-10-CM | POA: Diagnosis not present

## 2022-10-17 DIAGNOSIS — E160 Drug-induced hypoglycemia without coma: Secondary | ICD-10-CM | POA: Diagnosis not present

## 2022-10-17 MED ORDER — GVOKE HYPOPEN 1-PACK 1 MG/0.2ML ~~LOC~~ SOAJ: 1.0000 mg | SUBCUTANEOUS | 2 refills | Status: AC | PRN

## 2022-10-17 NOTE — Progress Notes (Unsigned)
Outpatient Endocrinology Note [place] Yvonnia Tango, MD  10/19/22  Patient's Name: [name]    DOB: May 21, 1940    MRN: [id number]                                                    REASON OF VISIT: Follow up for type 2 diabetes mellitus  REFERRING PROVIDER:   PCP: Wanda Plump, MD  HISTORY OF PRESENT ILLNESS:   [name] is a 82 y.o. old female with past medical history listed below, is here for follow up of type 2 diabetes mellitus. She has new referral to endocrinology for osteoporosis.   Pertinent Diabetes History: Patient was diagnosed with type 2 diabetes mellitus in 1998.  Patient was initially treated with metformin and Amaryl was added later on.  She was started on insulin therapy in 2014.  She had been on Victoza in the past.  Chronic Diabetes Complications : Retinopathy: yes following with ophthalmology.   Nephropathy: CKD 3B, on irbesartan Peripheral neuropathy: no Coronary artery disease: yes, CABG Stroke: no  Relevant comorbidities and cardiovascular risk factors: Obesity: no Body mass index is 28.49 kg/m.  Hypertension: yes Hyperlipidemia. Yes, on a statin  Current / Home Diabetic regimen includes: Novolin N 28 units in the morning and 16 units in the evening. Regular insulin 8 units with breakfast and 8 units with supper. Metformin 500 mg daily. Ozempic 0.5 mg weekly.  Prior diabetic medications: Victoza in the past, changed to Ozempic due to insurance preference in ~ May 2024.  Glycemic data: She has restarted freestyle libre 3 CGM from May 2024.  CONTINUOUS GLUCOSE MONITORING SYSTEM (CGMS) INTERPRETATION: At today's visit, we reviewed CGM downloads. The full report is scanned in the media. Reviewing the CGM trends, blood glucose are as follows:  FreeStyle Libre 3 CGM-  Sensor Download (Sensor download was reviewed and summarized below.) Dates: October 2 to October 17, 2022, 14 days. Sensor Average: 117  Glucose Management Indicator:  6.1% Glucose Variability: 31%  % data captured: 97%  Glycemic Trends:  <54: 0% 54-70: 7% 71-180: 84% 181-250: 9% 251-400: 0%  Interpretation: Mostly acceptable blood sugar and occasional hypoglycemia in the early morning and blood sugar readings 60s in upper 50s and sometimes hypoglycemia with blood sugar up to 60s after suppertime.  No significant hyperglycemia.  Rate of hypoglycemia has improved after decreasing the dose of insulin in last visit.  Hypoglycemia: Patient has frequent hypoglycemic episodes. Patient has hypoglycemia awareness.  Factors modifying glucose control: 1.  Diabetic diet assessment: 2-3 meals a day, she sometimes does not eat dinner.  At suppertime she usually does like to snack.  2.  Staying active or exercising: No formal exercise.  3.  Medication compliance: compliant all of the time.  # Osteoporosis : -Osteoporosis was being managed by primary care provider and referred to endocrinology after her DEXA scan in July 18, 2022.  -DEXA scan in July 18, 2022 T-score AP spine L1-L4 -0.7  and Right hip total -2.7 consistent with osteoporosis. ( Left hip not used due to surgery)  DEX scan in 12/2015, T-Score: AP spine -0.8, RFN -2.5, LFN -2.9 consistent with osteoporosis. (GE lunar)  Patient had remote history of foot fracture due to walking.  She had a history of left hip fracture in August 2020, started on Fosamax and later switched  to Prolia due to CKD and difficulty swallowing, first dose of Prolia in December 2021, she has been Prolia every 6 months without missing and last dose was in July 2024.  She is currently taking vitamin D3 1000 international unit daily, multivitamin 1 tablet daily.  She is not taking additional calcium.  She eats yogurt daily and fair amount of cheese.  CTX in August 2024 was 175 normal.  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  During the chemotherapy course she had received multiple course of  dexamethasone, which is on 6 factor for continued bone loss.  Patient has remote history of colon cancer. DCIS left breast.   Interval history 10/17/2022 Diabetes regimen and CGM data as reviewed above.  She is still having hypoglycemia however has been decreased compared to last visit.  She has no other complaints.  She has been taking vitamin D supplement.  No fall and fracture.  No other complaints today.  REVIEW OF SYSTEMS As per history of present illness.   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   Gastritis    GERD (gastroesophageal reflux disease)    Headache(784.0)    History of blood transfusion    "I"ve had 20 some; thru birth of children, loss of blood, last 2 were in ~ 1999 before my cancer surgery" (09/16/2015)   History of hiatal hernia    History of shingles    Hx of colonic polyps    Hyperlipidemia    Hypertension    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HCC) 2000   Myocardial infarction (HCC)    1997   Pneumonia    X 1   Postoperative hematoma involving circulatory system following cardiac catheterization 09/17/2015   S/P cardiac cath 09/16/15 09/17/2015   Type II diabetes mellitus (HCC)    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[facility])    as a child   Venous insufficiency     PAST SURGICAL HISTORY: Past Surgical History:  Procedure Laterality Date   BREAST BIOPSY Left 03/10/2022   MM LT BREAST BX W LOC DEV 1ST LESION IMAGE BX SPEC STEREO GUIDE 03/10/2022 [facility]    BREAST BIOPSY  05/01/2022   MM LT RADIOACTIVE SEED LOC MAMMO GUIDE 05/01/2022 [facility]   BREAST LUMPECTOMY WITH RADIOACTIVE SEED LOCALIZATION Left 05/02/2022   Procedure: LEFT BREAST LUMPECTOMY WITH RADIOACTIVE SEED LOCALIZATION;  Surgeon: Harriette Bouillon, MD;  Location: [facility];  Service: General;  Laterality: Left;   CARDIAC CATHETERIZATION  1997; 2008; 09/16/2015   CARDIAC CATHETERIZATION N/A 09/16/2015   Procedure: Right/Left Heart Cath and Coronary/Graft Angiography;  Surgeon: Runell Gess, MD;  Location: [facility];  Service: Cardiovascular;  Laterality: N/A;   CESAREAN SECTION  1966   COLONOSCOPY     CORONARY ARTERY BYPASS GRAFT  1997   CABG X5   EYE SURGERY     2 laser surgeries on  left eye with implant   EYE SURGERY Bilateral    cataracts   FEMUR IM NAIL Left 08/28/2018   Procedure: INTRAMEDULLARY (IM) NAIL FEMORAL;  Surgeon: Samson Frederic, MD;  Location: [facility];  Service: Orthopedics;  Laterality: Left;   FRACTURE SURGERY     HERNIA REPAIR     LAPAROSCOPIC ASSISTED VENTRAL HERNIA REPAIR  09/2008    with incarcerated colon;  Dr. 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 Samuella Cota   SHOULDER ARTHROSCOPY W/ ROTATOR CUFF REPAIR Right 1997   SMALL INTESTINE SURGERY     TONSILLECTOMY  1960s   TOTAL KNEE ARTHROPLASTY WITH HARDWARE REMOVAL Left 12/1994   (infex, hardware removed )- not replacement per patient   TUBAL LIGATION  1966    ALLERGIES: Allergies  Allergen Reactions   Codeine Other (See Comments)    REACTION: makes her nervous, orTylenol #3    Ibuprofen Other (See Comments)    REACTION: nervous   Meperidine Hcl Nausea And Vomiting   Naproxen Sodium Other (See Comments)    Or Advil  REACTION: nervous   Tramadol Other (See Comments)    Insomnia    Tylenol With Codeine #3 [Acetaminophen-Codeine] Nausea And Vomiting    FAMILY HISTORY:  Family History  Problem Relation Age of Onset   Heart disease Mother 72   Heart disease Father 48       MI   Hypertension Child    Heart attack Child    Diabetes Child    Heart disease Maternal Aunt        x 2, all deceased   Heart disease Maternal Uncle        x 4, all deceased   Emphysema Brother 33   Colon cancer Neg Hx    Breast cancer Neg Hx    Rectal cancer Neg Hx    Stomach cancer Neg Hx     SOCIAL HISTORY: Social History   Socioeconomic History   Marital status: Widowed    Spouse name: Not on file   Number of children: 2   Years of education: Not on file   Highest education level: Not on file  Occupational History   Occupation: retired-- [employer]     Employer: RETIRED  Tobacco Use   Smoking status: Never   Smokeless tobacco: Never  Vaping Use   Vaping status: Never Used  Substance and Sexual Activity   Alcohol use: No    Alcohol/week: 0.0 standard drinks of alcohol   Drug use: No   Sexual activity: Not Currently  Other Topics Concern   Not on file  Social History Narrative   Lives by herself, lost husband ~ 2004 , still drives    1 child in [place]   I child in [place]   Social Determinants of Health   Financial Resource Strain: Low Risk  (06/29/2022)   Overall Financial Resource Strain (CARDIA)    Difficulty of Paying Living Expenses: Not hard at all  Food Insecurity: No Food Insecurity (06/29/2022)   Hunger Vital Sign  Worried About Programme researcher, broadcasting/film/video in the Last Year: Never true    Ran Out of Food in the Last Year: Never true  Transportation Needs: No Transportation Needs (06/29/2022)   PRAPARE - Administrator, Civil Service (Medical):  No    Lack of Transportation (Non-Medical): No  Physical Activity: Inactive (06/29/2022)   Exercise Vital Sign    Days of Exercise per Week: 0 days    Minutes of Exercise per Session: 0 min  Stress: No Stress Concern Present (06/29/2022)   [employer] of Occupational Health - Occupational Stress Questionnaire    Feeling of Stress : Only a little  Social Connections: Moderately Integrated (06/29/2022)   Social Connection and Isolation Panel [NHANES]    Frequency of Communication with Friends and Family: More than three times a week    Frequency of Social Gatherings with Friends and Family: More than three times a week    Attends Religious Services: More than 4 times per year    Active Member of [employer] or Organizations: Yes    Attends Banker Meetings: More than 4 times per year    Marital Status: Widowed    MEDICATIONS:  Current Outpatient Medications  Medication Sig Dispense Refill   acetaminophen (TYLENOL) 500 MG tablet Take 1 tablet (500 mg total) by mouth every 6 (six) hours as needed. 30 tablet 0   amLODipine (NORVASC) 5 MG tablet TAKE 1 TABLET(5 MG) BY MOUTH DAILY 90 tablet 1   aspirin 81 MG chewable tablet Chew 1 tablet (81 mg total) by mouth daily.     atenolol (TENORMIN) 100 MG tablet Take 1 tablet (100 mg total) by mouth daily. 90 tablet 1   atorvastatin (LIPITOR) 20 MG tablet TAKE 1 TABLET(20 MG) BY MOUTH DAILY 90 tablet 3   cholecalciferol (VITAMIN D) 1000 UNITS tablet Take 1,000 Units by mouth daily.     clopidogrel (PLAVIX) 75 MG tablet Take 1 tablet (75 mg total) by mouth daily. 90 tablet 1   diazepam (VALIUM) 5 MG tablet Take 1 tablet (5 mg total) by mouth every 6 (six) hours as needed for anxiety. 20 tablet 0   fluticasone (FLONASE) 50 MCG/ACT nasal spray SHAKE LIQUID AND USE 2 SPRAYS IN EACH NOSTRIL DAILY 48 g 3   Glucagon (GVOKE HYPOPEN 1-PACK) 1 MG/0.2ML SOAJ Inject 1 mg into the skin as needed (low blood sugar with impaired consciousness). 0.4 mL 2    glucose blood (ONETOUCH ULTRA) test strip USE AS DIRECTED THREE TIMES DAILY 100 strip 5   insulin NPH Human (NOVOLIN N) 100 UNIT/ML injection Inject 28 units into the skin every morning and inject 16 units into the skin at bedtime. 10 mL 3   insulin regular (NOVOLIN R) 100 units/mL injection Inject 0.08 mLs (8 Units total) into the skin 2 (two) times daily before a meal. 10 mL 3   Insulin Syringe-Needle U-100 (INSULIN SYRINGE .5CC/31GX5/16") 31G X 5/16" 0.5 ML MISC USE TO INJECT INSULIN 5 TIMES PER DAY 200 each 0   irbesartan (AVAPRO) 300 MG tablet TAKE 1/2 TABLET(150 MG) BY MOUTH DAILY 45 tablet 3   isosorbide mononitrate (IMDUR) 30 MG 24 hr tablet TAKE 1 TABLET BY MOUTH EVERY DAY 90 tablet 3   metFORMIN (GLUCOPHAGE) 500 MG tablet TAKE 1 TABLET(500 MG) BY MOUTH DAILY WITH SUPPER 90 tablet 1   mometasone (ELOCON) 0.1 % cream Apply 1 Application topically daily as needed (itchy ears). Use as directed     Multiple Vitamin (MULTIVITAMIN)  capsule Take 1 capsule by mouth daily.     omeprazole (PRILOSEC) 20 MG capsule TAKE 1 CAPSULE(20 MG) BY MOUTH DAILY 90 capsule 3   ondansetron (ZOFRAN) 4 MG tablet Take 1 tablet (4 mg total) by mouth every 8 (eight) hours as needed for nausea or vomiting. 20 tablet 0   oxyCODONE (OXY IR/ROXICODONE) 5 MG immediate release tablet Take 1 tablet (5 mg total) by mouth every 6 (six) hours as needed for severe pain. 15 tablet 0   venetoclax (VENCLEXTA) 100 MG tablet Take 1 tablet (100 mg total) by mouth daily. Tablets should be swallowed whole with a meal and a full glass of water. 28 tablet 3   [START ON 10/23/2022] Semaglutide,0.25 or 0.5MG /DOS, (OZEMPIC, 0.25 OR 0.5 MG/DOSE,) 2 MG/3ML SOPN Inject 0.5 mg into the skin every Monday. 9 mL 0   No current facility-administered medications for this visit.   Facility-Administered Medications Ordered in Other Visits  Medication Dose Route Frequency Provider Last Rate Last Admin   vancomycin (VANCOCIN) 1,000 mg in sodium chloride  0.9 % 500 mL IVPB  1,000 mg Intravenous Once Cornett, Thomas, MD        PHYSICAL EXAM: Vitals:   10/17/22 1347  BP: 132/78  Pulse: 63  Resp: 20  SpO2: 98%  Weight: 171 lb 3.2 oz (77.7 kg)  Height: 5\' 5"  (1.651 m)   Body mass index is 28.49 kg/m.  Wt Readings from Last 3 Encounters:  10/17/22 171 lb 3.2 oz (77.7 kg)  08/31/22 169 lb 9.6 oz (76.9 kg)  08/22/22 169 lb 0.6 oz (76.7 kg)    General: Well developed, well nourished female in no apparent distress.  HEENT: AT/[place], no external lesions.  Eyes: Conjunctiva clear and no icterus. Neck: Neck supple  Lungs: Respirations not labored Neurologic: Alert, oriented, normal speech Extremities / Skin: Dry. No sores or rashes noted.  No spine tenderness. Psychiatric: Does not appear depressed or anxious  Diabetic Foot Exam - Simple   No data filed    LABS Reviewed Lab Results  Component Value Date   HGBA1C 5.0 08/31/2022   HGBA1C 5.2 04/26/2022   HGBA1C 4.6 02/15/2022   Lab Results  Component Value Date   FRUCTOSAMINE 205 05/11/2022   FRUCTOSAMINE 207 10/25/2021   FRUCTOSAMINE 230 06/28/2021   Lab Results  Component Value Date   CHOL 110 07/17/2022   HDL 37.20 (L) 07/17/2022   LDLCALC 41 05/05/2020   LDLDIRECT 38.0 07/17/2022   TRIG 262.0 (H) 07/17/2022   CHOLHDL 3 07/17/2022   Lab Results  Component Value Date   MICRALBCREAT 14.5 05/11/2022   MICRALBCREAT 12.7 12/09/2020   Lab Results  Component Value Date   CREATININE 1.25 (H) 08/31/2022   Lab Results  Component Value Date   GFR 40.00 (L) 08/31/2022    ASSESSMENT / PLAN  1. Type 2 diabetes mellitus with stage 3b chronic kidney disease, with long-term current use of insulin (HCC)   2. Age-related osteoporosis without current pathological fracture   3. Hypoglycemia due to insulin      Diabetes Mellitus type 2, complicated by CKD/ CAD - Diabetic status / severity: Controlled with hypoglycemia.  Lab Results  Component Value Date   HGBA1C 5.0  08/31/2022    - Hemoglobin A1c goal : <7% Still with hypoglycemia however reactive hypoglycemia has decreased.  Hemoglobin A1c in August was 5% probably falsely low due to anemia as well.  - Medications: Decrease insulin doses due to hypoglycemia. Diabetes regimen: Decrease Novolin N to  24 units in the morning and 12 units in the evening with meals. Decrease regular insulin to 5 units two times a day with meals. 3.   Continue ozempic and metformin same.  Take glucose tablet take 2-4 tablets when glucose is low. It is over the counter.  - Home glucose testing: Continue CGM/freestyle libre 3.  Check blood sugar as needed. - Discussed/ Gave Hypoglycemia treatment plan.  In detail about risks of hypoglycemia.  He is asked to get glucose tablet over-the-counter and take 2 to 4 tablets for correction of hypoglycemia.  Patient lives by herself not able to use Glucagon Emergency Kit.  # Consult : not required at this time.   # Annual urine for microalbuminuria/ creatinine ratio, no microalbuminuria currently, continue ACE/ARB /irbesartan. Last  Lab Results  Component Value Date   MICRALBCREAT 14.5 05/11/2022    # Foot check nightly / neuropathy.  # Annual dilated diabetic eye exams.   2. Blood pressure  -  BP Readings from Last 1 Encounters:  10/17/22 132/78    - Control is in target.  - No change in current plans.  3. Lipid status / Hyperlipidemia - Last  Lab Results  Component Value Date   LDLCALC 41 05/05/2020   - Continue atorvastatin 20 mg daily.  # Osteoporosis -Patient had recent DEXA scan in July 2024 consistent with osteoporosis based on lowest T-score of -2.7 at right hip and T-score at lumbar spine -0.7.  She had DEXA scan in 2017 with lowest T-score of -2.9 at femoral neck and T-score of -0.8 at lumbar spine consistent with osteoporosis. -She was treated with Fosamax initially started in August 2020 and switched to Prolia.  First dose was in December 2021, she has  received Prolia every 6 months and last dose was in July 2024. -She has no recent fracture. -Not able to compare bone density done in 2017 and bone density in July 2024 as they were done in different places.  However overall it seems to be stable, over the period of 7 years, despite she had received multiple doses of dexamethasone for the treatment of CLL in last 2 years which is a risk for bone loss.  Plan: -Continue Prolia 60 mg subcutaneous every 6 months. -Continue current dose of vitamin D and calcium. Advised to take calcium 500 mg daily in addition to dietary calcium and multivitamin. -Discussed about fall precautions.  Diagnoses and all orders for this visit:  Type 2 diabetes mellitus with stage 3b chronic kidney disease, with long-term current use of insulin (HCC)  Age-related osteoporosis without current pathological fracture  Hypoglycemia due to insulin  Other orders -     Glucagon (GVOKE HYPOPEN 1-PACK) 1 MG/0.2ML SOAJ; Inject 1 mg into the skin as needed (low blood sugar with impaired consciousness).    DISPOSITION Follow up in clinic in 6 weeks suggested.   All questions answered and patient verbalized understanding of the plan.  [place] Dedric Ethington, MD [facility] Endocrinology [facility] [address] [place], [address] [place], [place] [address] Phone # [phone]  At least part of this note was generated using voice recognition software. Inadvertent word errors may have occurred, which were not recognized during the proofreading process.

## 2022-10-17 NOTE — Patient Instructions (Signed)
Diabetes regimen: Decrease Novolin N to 24 units in the morning and 12 units in the evening with meals. Decrease regular insulin to 5 units two times a day with meals. 3.   Continue ozempic and metformin same.  Take glucose tablet take 2-4 tablets when glucose is low. It is over the counter.

## 2022-10-19 ENCOUNTER — Telehealth: Payer: Self-pay

## 2022-10-19 ENCOUNTER — Other Ambulatory Visit: Payer: Self-pay

## 2022-10-19 DIAGNOSIS — E1165 Type 2 diabetes mellitus with hyperglycemia: Secondary | ICD-10-CM

## 2022-10-19 MED ORDER — OZEMPIC (0.25 OR 0.5 MG/DOSE) 2 MG/3ML ~~LOC~~ SOPN: 0.5000 mg | PEN_INJECTOR | SUBCUTANEOUS | 0 refills | Status: DC

## 2022-10-19 NOTE — Telephone Encounter (Signed)
Patient called returned, VM left for  advisement of a prescription.

## 2022-10-19 NOTE — Telephone Encounter (Signed)
Ozempic refill request complete

## 2022-10-23 ENCOUNTER — Encounter: Payer: Self-pay | Admitting: Endocrinology

## 2022-10-26 ENCOUNTER — Other Ambulatory Visit: Payer: Self-pay

## 2022-10-30 ENCOUNTER — Inpatient Hospital Stay (HOSPITAL_BASED_OUTPATIENT_CLINIC_OR_DEPARTMENT_OTHER): Payer: Medicare Other | Admitting: Hematology & Oncology

## 2022-10-30 ENCOUNTER — Other Ambulatory Visit: Payer: Self-pay

## 2022-10-30 ENCOUNTER — Other Ambulatory Visit: Payer: Self-pay | Admitting: Hematology & Oncology

## 2022-10-30 ENCOUNTER — Inpatient Hospital Stay: Payer: Medicare Other | Attending: Hematology & Oncology

## 2022-10-30 ENCOUNTER — Encounter: Payer: Self-pay | Admitting: Internal Medicine

## 2022-10-30 VITALS — BP 156/59 | HR 64 | Temp 97.7°F | Resp 17 | Ht 65.0 in | Wt 165.0 lb

## 2022-10-30 DIAGNOSIS — Z85038 Personal history of other malignant neoplasm of large intestine: Secondary | ICD-10-CM | POA: Insufficient documentation

## 2022-10-30 DIAGNOSIS — C9110 Chronic lymphocytic leukemia of B-cell type not having achieved remission: Secondary | ICD-10-CM

## 2022-10-30 DIAGNOSIS — D509 Iron deficiency anemia, unspecified: Secondary | ICD-10-CM | POA: Insufficient documentation

## 2022-10-30 DIAGNOSIS — D0512 Intraductal carcinoma in situ of left breast: Secondary | ICD-10-CM | POA: Insufficient documentation

## 2022-10-30 DIAGNOSIS — Q999 Chromosomal abnormality, unspecified: Secondary | ICD-10-CM | POA: Insufficient documentation

## 2022-10-30 LAB — CBC WITH DIFFERENTIAL (CANCER CENTER ONLY)
Abs Immature Granulocytes: 0.01 10*3/uL (ref 0.00–0.07)
Basophils Absolute: 0 10*3/uL (ref 0.0–0.1)
Basophils Relative: 0 %
Eosinophils Absolute: 0 10*3/uL (ref 0.0–0.5)
Eosinophils Relative: 0 %
HCT: 29.6 % — ABNORMAL LOW (ref 36.0–46.0)
Hemoglobin: 10.1 g/dL — ABNORMAL LOW (ref 12.0–15.0)
Immature Granulocytes: 0 %
Lymphocytes Relative: 28 %
Lymphs Abs: 1.4 10*3/uL (ref 0.7–4.0)
MCH: 33.4 pg (ref 26.0–34.0)
MCHC: 34.1 g/dL (ref 30.0–36.0)
MCV: 98 fL (ref 80.0–100.0)
Monocytes Absolute: 0.5 10*3/uL (ref 0.1–1.0)
Monocytes Relative: 11 %
Neutro Abs: 3 10*3/uL (ref 1.7–7.7)
Neutrophils Relative %: 61 %
Platelet Count: 212 10*3/uL (ref 150–400)
RBC: 3.02 MIL/uL — ABNORMAL LOW (ref 3.87–5.11)
RDW: 12.4 % (ref 11.5–15.5)
WBC Count: 4.9 10*3/uL (ref 4.0–10.5)
nRBC: 0 % (ref 0.0–0.2)

## 2022-10-30 LAB — CMP (CANCER CENTER ONLY)
ALT: 13 U/L (ref 0–44)
AST: 16 U/L (ref 15–41)
Albumin: 4.1 g/dL (ref 3.5–5.0)
Alkaline Phosphatase: 72 U/L (ref 38–126)
Anion gap: 7 (ref 5–15)
BUN: 21 mg/dL (ref 8–23)
CO2: 30 mmol/L (ref 22–32)
Calcium: 9.9 mg/dL (ref 8.9–10.3)
Chloride: 107 mmol/L (ref 98–111)
Creatinine: 1.41 mg/dL — ABNORMAL HIGH (ref 0.44–1.00)
GFR, Estimated: 37 mL/min — ABNORMAL LOW (ref >60)
Glucose, Bld: 97 mg/dL (ref 70–99)
Potassium: 4.3 mmol/L (ref 3.5–5.1)
Sodium: 144 mmol/L (ref 135–145)
Total Bilirubin: 0.4 mg/dL (ref 0.3–1.2)
Total Protein: 6.4 g/dL — ABNORMAL LOW (ref 6.5–8.1)

## 2022-10-30 LAB — LACTATE DEHYDROGENASE: LDH: 148 U/L (ref 98–192)

## 2022-10-30 LAB — SAVE SMEAR(SSMR), FOR PROVIDER SLIDE REVIEW

## 2022-10-30 MED ORDER — VENETOCLAX 100 MG PO TABS
100.0000 mg | ORAL_TABLET | Freq: Every day | ORAL | 3 refills | Status: DC
Start: 2022-10-30 — End: 2023-02-16
  Filled 2022-10-30: qty 28, 28d supply, fill #0
  Filled 2022-11-23: qty 28, 28d supply, fill #1
  Filled 2022-12-20: qty 28, 28d supply, fill #2
  Filled 2023-01-18: qty 28, 28d supply, fill #3

## 2022-10-30 NOTE — Progress Notes (Signed)
Specialty Pharmacy Refill Coordination Note  [name] is a 82 y.o. female contacted today regarding refills of specialty medication(s) Venetoclax   Patient requested Delivery   Delivery date: 11/02/22   Verified address: [address]   Medication will be filled on 11/01/22 pending refill request.

## 2022-10-30 NOTE — Progress Notes (Signed)
Refill received. Mail 10/30.

## 2022-10-30 NOTE — Progress Notes (Signed)
Hematology and Oncology Follow Up Visit  Rozalind Galloway Swett [id number] 1940-11-30 82 y.o. 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 Gaede is here today for follow-up.  She is managing okay.  She is still getting over the loss of her son who passed away from cancer I think back in July.  Thankfully, she has a lot of family who will help her out.  She is doing well on the venetoclax.  I told her that she will will stop this in March as he will then be 2 years that she had been on it..  She has had no issues with nausea or vomiting.  She has had no change in bowel or bladder habits.  She did not go with any radiation therapy for the DCIS of the LEFT breast.  She does not have any issues with bleeding.  There is been no leg swelling.  Her blood sugar is being watched.  She has osteoporosis.  She does see endocrinology for this.  Overall, I would think that her performance status is probably ECOG 1.     Medications:  Allergies as of 10/30/2022       Reactions   Codeine Other (See Comments)   REACTION: makes her nervous, orTylenol #3   Ibuprofen Other (See Comments)   REACTION: nervous   Meperidine Hcl Nausea And Vomiting   Naproxen Sodium Other (See Comments)   Or Advil REACTION: nervous   Tramadol Other (See Comments)   Insomnia    Tylenol With Codeine #3 [acetaminophen-codeine] Nausea And Vomiting        Medication List        Accurate as of October 30, 2022  1:14 PM. If you have any questions, ask your nurse or doctor.          acetaminophen 500 MG  tablet Commonly known as: TYLENOL Take 1 tablet (500 mg total) by mouth every 6 (six) hours as needed.   amLODipine 5 MG tablet Commonly known as: NORVASC TAKE 1 TABLET(5 MG) BY MOUTH DAILY   aspirin 81 MG chewable tablet Chew 1 tablet (81 mg total) by mouth daily.   atenolol 100 MG tablet Commonly known as: TENORMIN Take 1 tablet (100 mg total) by mouth daily.   atorvastatin 20 MG tablet Commonly known as: LIPITOR TAKE 1 TABLET(20 MG) BY MOUTH DAILY   cholecalciferol 1000 units tablet Commonly known as: VITAMIN D Take 1,000 Units by mouth daily.   clopidogrel 75 MG tablet Commonly known as: PLAVIX Take 1 tablet (75 mg total) by mouth daily.   diazepam 5 MG tablet Commonly known as: VALIUM Take 1 tablet (5 mg total) by mouth every 6 (six) hours as needed for anxiety.   fluticasone 50 MCG/ACT nasal spray Commonly known as: FLONASE SHAKE LIQUID AND USE 2 SPRAYS IN EACH NOSTRIL DAILY   Gvoke HypoPen 1-Pack 1 MG/0.2ML Soaj Generic drug: Glucagon Inject 1 mg into the skin as needed (low blood sugar with impaired consciousness).   insulin NPH Human 100 UNIT/ML injection Commonly known as: NOVOLIN N Inject 28 units into the skin  every morning and inject 16 units into the skin at bedtime.   insulin regular 100 units/mL injection Commonly known as: NOVOLIN R Inject 0.08 mLs (8 Units total) into the skin 2 (two) times daily before a meal.   INSULIN SYRINGE .5CC/31GX5/16" 31G X 5/16" 0.5 ML Misc USE TO INJECT INSULIN 5 TIMES PER DAY   irbesartan 300 MG tablet Commonly known as: AVAPRO TAKE 1/2 TABLET(150 MG) BY MOUTH DAILY   isosorbide mononitrate 30 MG 24 hr tablet Commonly known as: IMDUR TAKE 1 TABLET BY MOUTH EVERY DAY   metFORMIN 500 MG tablet Commonly known as: GLUCOPHAGE TAKE 1 TABLET(500 MG) BY MOUTH DAILY WITH SUPPER   mometasone 0.1 % cream Commonly known as: ELOCON Apply 1 Application topically daily as needed (itchy ears). Use as directed    multivitamin capsule Take 1 capsule by mouth daily.   omeprazole 20 MG capsule Commonly known as: PRILOSEC TAKE 1 CAPSULE(20 MG) BY MOUTH DAILY   ondansetron 4 MG tablet Commonly known as: Zofran Take 1 tablet (4 mg total) by mouth every 8 (eight) hours as needed for nausea or vomiting.   OneTouch Ultra test strip Generic drug: glucose blood USE AS DIRECTED THREE TIMES DAILY   oxyCODONE 5 MG immediate release tablet Commonly known as: Oxy IR/ROXICODONE Take 1 tablet (5 mg total) by mouth every 6 (six) hours as needed for severe pain.   Ozempic (0.25 or 0.5 MG/DOSE) 2 MG/3ML Sopn Generic drug: Semaglutide(0.25 or 0.5MG /DOS) Inject 0.5 mg into the skin every Monday.   Venclexta 100 MG tablet Generic drug: venetoclax Take 1 tablet (100 mg total) by mouth daily. Tablets should be swallowed whole with a meal and a full glass of water.        Allergies:  Allergies  Allergen Reactions   Codeine Other (See Comments)    REACTION: makes her nervous, orTylenol #3   Ibuprofen Other (See Comments)    REACTION: nervous   Meperidine Hcl Nausea And Vomiting   Naproxen Sodium Other (See Comments)    Or Advil  REACTION: nervous   Tramadol Other (See Comments)    Insomnia    Tylenol With Codeine #3 [Acetaminophen-Codeine] Nausea And Vomiting    Past Medical History, Surgical history, Social history, and Family History were reviewed and updated.  Review of Systems: Review of Systems  Constitutional: Negative.   HENT: Negative.    Eyes: Negative.   Respiratory: Negative.    Cardiovascular: Negative.   Gastrointestinal: Negative.   Genitourinary: Negative.   Musculoskeletal: Negative.   Skin: Negative.   Neurological: Negative.   Endo/Heme/Allergies: Negative.   Psychiatric/Behavioral: Negative.       Physical Exam:  height is 5\' 5"  (1.651 m) and weight is 165 lb (74.8 kg). Her oral temperature is 97.7 F (36.5 C). Her blood pressure is 156/59 (abnormal) and her pulse  is 64. Her respiration is 17 and oxygen saturation is 100%.   Wt Readings from Last 3 Encounters:  10/30/22 165 lb (74.8 kg)  10/17/22 171 lb 3.2 oz (77.7 kg)  08/31/22 169 lb 9.6 oz (76.9 kg)    Physical Exam Vitals reviewed.  Constitutional:      Comments: Her breast exam shows right breast no masses, edema or erythema.  There is no right axillary adenopathy.  Left breast shows the lumpectomy scar at the rim of the areola.  This is at about the 12 o'clock position.  This is healing.  There is a little bit of ecchymoses.  There is a  little bit of tenderness.  There is no erythema or swelling.  There is no left axillary adenopathy.  HENT:     Head: Normocephalic and atraumatic.  Eyes:     Pupils: Pupils are equal, round, and reactive to light.  Cardiovascular:     Rate and Rhythm: Normal rate and regular rhythm.     Heart sounds: Normal heart sounds.  Pulmonary:     Effort: Pulmonary effort is normal.     Breath sounds: Normal breath sounds.  Abdominal:     General: Bowel sounds are normal.     Palpations: Abdomen is soft.  Musculoskeletal:        General: No tenderness or deformity. Normal range of motion.     Cervical back: Normal range of motion.  Lymphadenopathy:     Cervical: No cervical adenopathy.  Skin:    General: Skin is warm and dry.     Findings: No erythema or rash.  Neurological:     Mental Status: She is alert and oriented to person, place, and time.  Psychiatric:        Behavior: Behavior normal.        Thought Content: Thought content normal.        Judgment: Judgment normal.      Lab Results  Component Value Date   WBC 4.9 10/30/2022   HGB 10.1 (L) 10/30/2022   HCT 29.6 (L) 10/30/2022   MCV 98.0 10/30/2022   PLT 212 10/30/2022   Lab Results  Component Value Date   FERRITIN 118 08/22/2022   IRON 98 08/22/2022   TIBC 291 08/22/2022   UIBC 193 08/22/2022   IRONPCTSAT 34 (H) 08/22/2022   Lab Results  Component Value Date   RETICCTPCT 2.2  08/22/2022   RBC 3.02 (L) 10/30/2022   Lab Results  Component Value Date   KPAFRELGTCHN 32.8 (H) 05/16/2021   LAMBDASER 26.4 (H) 05/16/2021   KAPLAMBRATIO 1.24 05/16/2021   Lab Results  Component Value Date   IGGSERUM 845 08/22/2022   IGA 185 08/22/2022   IGMSERUM 37 08/22/2022   Lab Results  Component Value Date   TOTALPROTELP 6.5 11/18/2020   ALBUMINELP 3.6 11/18/2020   A1GS 0.3 11/18/2020   A2GS 0.9 11/18/2020   BETS 1.0 11/18/2020   BETA2SER 6.2 02/25/2014   GAMS 0.7 11/18/2020   MSPIKE Not Observed 11/18/2020   SPEI * 02/25/2014     Chemistry      Component Value Date/Time   NA 144 10/30/2022 1210   NA 147 (H) 11/03/2016 1018   NA 143 08/30/2016 0954   K 4.3 10/30/2022 1210   K 3.9 11/03/2016 1018   K 4.5 08/30/2016 0954   CL 107 10/30/2022 1210   CL 110 (H) 11/03/2016 1018   CO2 30 10/30/2022 1210   CO2 27 11/03/2016 1018   CO2 24 08/30/2016 0954   BUN 21 10/30/2022 1210   BUN 25 (H) 11/03/2016 1018   BUN 20.5 08/30/2016 0954   CREATININE 1.41 (H) 10/30/2022 1210   CREATININE 1.6 (H) 11/03/2016 1018   CREATININE 1.4 (H) 08/30/2016 0954      Component Value Date/Time   CALCIUM 9.9 10/30/2022 1210   CALCIUM 10.1 11/03/2016 1018   CALCIUM 9.8 08/30/2016 0954   ALKPHOS 72 10/30/2022 1210   ALKPHOS 112 (H) 11/03/2016 1018   ALKPHOS 113 08/30/2016 0954   AST 16 10/30/2022 1210   AST 23 08/30/2016 0954   ALT 13 10/30/2022 1210   ALT 29 11/03/2016 1018  ALT 20 08/30/2016 0954   BILITOT 0.4 10/30/2022 1210   BILITOT 0.38 08/30/2016 0954      Impression and Plan: Ms. Manzi is a very pleasant 82 yo caucasian female with CLL.  So far, we have not had any negative prognostic markers.  She does have the 13q-chromosomal abnormality.  This is typically construed as a good prognostic marker.  She also has  DCIS of the left breast.  She has had multiple surgeries.  She had a excision after her positive margin.  Thankfully, the reexcision did not show any  malignancy.  Again, she did not wish to have any radiation therapy for this.  I can certainly understand this.  We will plan to get her back after the Holiday season.  Again I know this might be tough given the fact that her son passed away.  Again, she is incredibly strong.   Josph Macho, MD 10/28/20241:14 PM

## 2022-10-30 NOTE — Progress Notes (Signed)
Specialty Pharmacy Ongoing Clinical Assessment Note  [name] is a 82 y.o. female who is being followed by the specialty pharmacy service for RxSp Oncology   Patient's specialty medication(s) reviewed today: Venetoclax   Missed doses in the last 4 weeks: 0   Patient/Caregiver did not have any additional questions or concerns.   Therapeutic benefit summary: Patient is achieving benefit   Adverse events/side effects summary: No adverse events/side effects   Patient's therapy is appropriate to: Continue    Goals Addressed             This Visit's Progress    Slow Disease Progression       Patient is on track. Patient will maintain adherence         Follow up:  6 months  Bobette Mo Specialty Pharmacist

## 2022-10-31 ENCOUNTER — Encounter: Payer: Self-pay | Admitting: *Deleted

## 2022-10-31 ENCOUNTER — Other Ambulatory Visit (HOSPITAL_COMMUNITY): Payer: Self-pay

## 2022-10-31 NOTE — Progress Notes (Unsigned)
Patient has decided that she doesn't want any additional treatment after her lumpectomy for her DCIS. She will forego AI and radiation. She continues her treatment for CLL.   Oncology Nurse Navigator Documentation     10/31/2022    7:45 AM  Oncology Nurse Navigator Flowsheets  Navigator Follow Up Date: 01/08/2023  Navigator Follow Up Reason: Follow-up Appointment  Navigator Location [facility]  Navigator Encounter Type Appt/Treatment Plan Review  Patient Visit Type MedOnc  Treatment Phase Post-Tx Follow-up  Barriers/Navigation Needs No Barriers At This Time  Interventions None Required  Acuity Level 1-No Barriers  Time Spent with Patient 15

## 2022-11-01 ENCOUNTER — Other Ambulatory Visit: Payer: Self-pay

## 2022-11-01 ENCOUNTER — Encounter: Payer: Self-pay | Admitting: Family

## 2022-11-07 ENCOUNTER — Encounter: Payer: Self-pay | Admitting: Internal Medicine

## 2022-11-14 DIAGNOSIS — E113313 Type 2 diabetes mellitus with moderate nonproliferative diabetic retinopathy with macular edema, bilateral: Secondary | ICD-10-CM | POA: Diagnosis not present

## 2022-11-14 DIAGNOSIS — H43813 Vitreous degeneration, bilateral: Secondary | ICD-10-CM | POA: Diagnosis not present

## 2022-11-14 DIAGNOSIS — H35432 Paving stone degeneration of retina, left eye: Secondary | ICD-10-CM | POA: Diagnosis not present

## 2022-11-14 DIAGNOSIS — Q141 Congenital malformation of retina: Secondary | ICD-10-CM | POA: Diagnosis not present

## 2022-11-17 ENCOUNTER — Ambulatory Visit (INDEPENDENT_AMBULATORY_CARE_PROVIDER_SITE_OTHER): Payer: Medicare Other | Admitting: Internal Medicine

## 2022-11-17 ENCOUNTER — Encounter: Payer: Self-pay | Admitting: Internal Medicine

## 2022-11-17 VITALS — BP 136/72 | HR 61 | Temp 98.1°F | Resp 18 | Ht 65.0 in | Wt 167.5 lb

## 2022-11-17 DIAGNOSIS — E1159 Type 2 diabetes mellitus with other circulatory complications: Secondary | ICD-10-CM

## 2022-11-17 DIAGNOSIS — M810 Age-related osteoporosis without current pathological fracture: Secondary | ICD-10-CM | POA: Diagnosis not present

## 2022-11-17 DIAGNOSIS — I152 Hypertension secondary to endocrine disorders: Secondary | ICD-10-CM | POA: Diagnosis not present

## 2022-11-17 DIAGNOSIS — Z23 Encounter for immunization: Secondary | ICD-10-CM | POA: Diagnosis not present

## 2022-11-17 NOTE — Progress Notes (Unsigned)
Subjective:    Patient ID: [name], female    DOB: [birth year], 82 y.o.   MRN: [id number]  DOS:  11/17/2022 Type of visit - description: Follow-up  Since the last office visit , saw endocrinology and hematology. Blood sugars are still occasionally low. Ambulatory blood pressures are great. Good med compliance.  Last night he had nausea, it was transient, better with Zofran.  No abdominal pain diarrhea or blood in the stools.  Blood sugar sensor did not trigger any alarm.  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   Gastritis    GERD (gastroesophageal reflux disease)    Headache(784.0)    History of blood transfusion    "I"ve had 20 some; thru birth of children, loss of blood, last 2 were in ~ 1999 before my cancer surgery" (09/16/2015)   History of hiatal hernia    History of shingles    Hx of colonic polyps    Hyperlipidemia    Hypertension    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HCC) 2000   Myocardial infarction (HCC)    1997   Pneumonia    X 1   Postoperative hematoma involving circulatory system following cardiac catheterization 09/17/2015   S/P cardiac cath 09/16/15 09/17/2015   Type II diabetes mellitus (HCC)    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[facility])    as a child   Venous insufficiency     Past Surgical History:  Procedure Laterality Date   BREAST BIOPSY Left 03/10/2022   MM LT BREAST BX W LOC DEV 1ST LESION IMAGE BX SPEC STEREO GUIDE 03/10/2022 [facility]  MAMMOGRAPHY   BREAST BIOPSY  05/01/2022   MM LT RADIOACTIVE SEED LOC MAMMO GUIDE 05/01/2022 [facility] MAMMOGRAPHY   BREAST LUMPECTOMY WITH RADIOACTIVE SEED LOCALIZATION Left 05/02/2022   Procedure: LEFT BREAST LUMPECTOMY WITH RADIOACTIVE SEED LOCALIZATION;  Surgeon: Harriette Bouillon, MD;  Location: [facility] OR;  Service: General;  Laterality: Left;   CARDIAC CATHETERIZATION  1997; 2008; 09/16/2015   CARDIAC CATHETERIZATION N/A 09/16/2015   Procedure: Right/Left Heart Cath and Coronary/Graft Angiography;  Surgeon: Runell Gess, MD;  Location: [facility] INVASIVE CV LAB;  Service: Cardiovascular;  Laterality: N/A;   CESAREAN SECTION  1966   COLONOSCOPY     CORONARY ARTERY BYPASS GRAFT  1997   CABG X5   EYE SURGERY     2 laser surgeries on left eye with implant   EYE SURGERY Bilateral    cataracts   FEMUR IM NAIL Left 08/28/2018   Procedure: INTRAMEDULLARY (IM) NAIL FEMORAL;  Surgeon: Samson Frederic, MD;  Location: [facility] ORS;  Service: Orthopedics;  Laterality: Left;   FRACTURE SURGERY     HERNIA REPAIR     LAPAROSCOPIC ASSISTED VENTRAL HERNIA REPAIR  09/2008    with incarcerated colon;  Dr. 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 Samuella Cota   SHOULDER ARTHROSCOPY W/ ROTATOR CUFF REPAIR Right 1997   SMALL INTESTINE SURGERY     TONSILLECTOMY  1960s   TOTAL KNEE ARTHROPLASTY WITH HARDWARE REMOVAL Left 12/1994   (infex, hardware removed )- not replacement per patient   TUBAL LIGATION  1966    Current Outpatient Medications  Medication Instructions   acetaminophen (TYLENOL) 500 mg, Oral, Every 6  hours PRN   amLODipine (NORVASC) 5 MG tablet TAKE 1 TABLET(5 MG) BY MOUTH DAILY   aspirin 81 mg, Oral, Daily   atenolol (TENORMIN) 100 mg, Oral, Daily   atorvastatin (LIPITOR) 20 MG tablet TAKE 1 TABLET(20 MG) BY MOUTH DAILY   Calcium Carb-Cholecalciferol (CALCIUM 500+D PO) Oral   clopidogrel (PLAVIX) 75 mg, Oral, Daily   diazepam (VALIUM) 5 mg, Oral, Every 6 hours PRN   fluticasone (FLONASE) 50 MCG/ACT nasal spray SHAKE LIQUID AND USE 2 SPRAYS IN EACH NOSTRIL DAILY   glucose blood (ONETOUCH ULTRA) test strip USE AS DIRECTED THREE TIMES DAILY   Gvoke HypoPen 1-Pack 1 mg, Subcutaneous, As needed   insulin NPH Human (NOVOLIN N) 100 UNIT/ML injection Inject 28 units into the skin every morning and inject 16 units into the skin at bedtime.   insulin regular (NOVOLIN R) 8 Units, Subcutaneous, 2 times daily before meals   Insulin Syringe-Needle U-100 (INSULIN SYRINGE .5CC/31GX5/16") 31G X 5/16" 0.5 ML MISC USE TO INJECT INSULIN 5 TIMES PER DAY   irbesartan (AVAPRO) 300 MG tablet TAKE 1/2 TABLET(150 MG) BY MOUTH DAILY   isosorbide mononitrate (IMDUR) 30 mg, Oral, Daily   metFORMIN (GLUCOPHAGE) 500 MG tablet TAKE 1 TABLET(500 MG) BY MOUTH DAILY WITH SUPPER   mometasone (ELOCON) 0.1 % cream 1 Application, Topical, Daily PRN, Use as directed   Multiple Vitamin (MULTIVITAMIN) capsule 1 capsule, Oral, Daily,     omeprazole (PRILOSEC) 20 MG capsule TAKE 1 CAPSULE(20 MG) BY MOUTH DAILY   ondansetron (ZOFRAN) 4 mg, Oral, Every 8 hours PRN   oxyCODONE (OXY IR/ROXICODONE) 5 mg, Oral, Every 6 hours PRN   Ozempic (0.25 or 0.5 MG/DOSE) 0.5 mg, Subcutaneous, Every Mon   Venclexta 100 mg, Oral, Daily, Tablets should be swallowed whole with a meal and a full glass of water.       Objective:   Physical Exam BP 136/72   Pulse 61   Temp 98.1 F (36.7 C) (Oral)   Resp 18   Ht 5\' 5"  (1.651 m)   Wt 167 lb 8 oz (76 kg)   SpO2 97%   BMI 27.87 kg/m  General:   Well developed, NAD, BMI noted. HEENT:   Normocephalic . Face symmetric, atraumatic Lungs:  CTA B Normal respiratory effort, no intercostal retractions, no accessory muscle use. Heart: RRR,  no murmur.  Lower extremities: no pretibial edema bilaterally  Skin: Not pale. Not jaundice Neurologic:  alert & oriented X3.  Speech normal, gait appropriate for age and unassisted Psych--  Cognition and judgment appear intact.  Cooperative with normal attention span and concentration.  Behavior appropriate. No anxious or depressed appearing.      Assessment  Assessment DM , +retinopathy, per endo- HTN Hyperlipidemia CKD: creat ~1.4  (GFR ~ 38) [place] 09-2019: No obstruction, bilateral cortical thinning and a small renal cysts Depression/anxiety: tranxene qhs prn (takes rarely) MSK: -DJD  - Osteoporosis ---T score -0.8 on December 2017, h/o a foot FX d/t walking years ago --- (L) Hip Fx, 08/2018: started fosamax, switch to Prolia d/t CKD and diff swallowing, first dose 12-2019 Hem/Onc: Dr Myna Hidalgo  -CLL -iron deficiency anemia: IV iron 2018 -B12 def -L DCIS, surgery 2024. CAD, Dr Allyson Sabal MI 2 >> CABG, cath 12-13-2006, myoview 2012 no ischemic CP: Stress test 08/05/2015, indeterminate risk study, + lateral ischemia: Cardiac catheterization 09/16/2015: Rx medical Venous insuff GI:  Dr Lavon Paganini ---GERD, IBS, h/o Gastritis ---H/o ulcerative colitis as a child H/o shingles  BCC Dr Terri Piedra, bx 04-2015, MOH's 06-2016   PLAN Recent labs reviewed: Creatinine 1.4, stable LDL 38.  A1c 5. DM: Last visit with Endo 10/17/2022.  Hyperglycemia, insulin regimen adjusted, reports that since then, low blood sugars are not as frequent, she does have CGM and that has been very useful, she Karenz orange juice with her. CLL, DCIS L breast: LOV oncology 10/30/2022.  Stable. HTN: Ambulatory BPs reportedly okay, BP today 136/72.  No change. Osteoporosis, next Prolia injection 01-2023. Preventive care: Flu shot today, recommend a COVID-vaccine RTC  4 to 5 months    7-15 CAD: On dual antiplatelet, no substernal chest pain, chronic DOE, was slightly worse last week but is back to normal.  Calves symmetric and nontender.  No change for now. HTN: Available BP readings reviewed, they have been slightly high but today is very good, recommend to check at home if possible. Continue amlodipine, Tenormin, Avapro.  Last creatinine at baseline around 1.4. Anemia: Per chart review, last hemoglobin 10.7, iron and ferritin normal, probably multifactorial. DM: Saw Endo 05/16/2022.  Was referred to the nutritionist CLL: Saw hematology oncology 07/07/2022.  Felt to be stable on Venclexta L breast DCIS: Status post excision, + margin, reexcision showed no residual malignancy.  Next steps per oncology. Osteoporosis: Prolia shot today.,  Check DEXA. Stress: Has a son is at [facility], reports he has advanced cancer, recovering from sepsis.  Listening therapy provided. Vaccines I recommend: RSV, COVID booster, flu shot this fall RTC 4 m

## 2022-11-17 NOTE — Patient Instructions (Addendum)
Vaccines I recommend:  Covid booster  Check the  blood pressure regularly Blood pressure goal:  between 110/65 and  135/85. If it is consistently higher or lower, let me know      Next visit with me  4-5 months  Please schedule it at the front desk

## 2022-11-19 NOTE — Assessment & Plan Note (Signed)
Recent labs reviewed: Creatinine 1.4, stable LDL 38.  A1c 5. DM: Last visit with Endo 10/17/2022.  Hyperglycemia, insulin regimen adjusted, reports that since then, low blood sugars are not as frequent, she does have CGM and that has been very useful, carries orange juice with her. CLL, DCIS L breast: LOV oncology 10/30/2022.  Stable. HTN: Ambulatory BPs reportedly okay, BP today 136/72.  No change. Osteoporosis, next Prolia injection 01-2023. Preventive care: Flu shot today, recommend a COVID-vaccine RTC 4 to 5 months

## 2022-11-20 DIAGNOSIS — D0512 Intraductal carcinoma in situ of left breast: Secondary | ICD-10-CM | POA: Diagnosis not present

## 2022-11-23 ENCOUNTER — Other Ambulatory Visit: Payer: Self-pay

## 2022-11-23 NOTE — Progress Notes (Signed)
Specialty Pharmacy Refill Coordination Note  Zip H Hakes is a 82 y.o. female contacted today regarding refills of specialty medication(s) Venetoclax   Patient requested Delivery   Delivery date: 11/27/22   Verified address: [address] [place] [address]   Medication will be filled on 11/24/22.

## 2022-11-28 ENCOUNTER — Other Ambulatory Visit: Payer: Self-pay

## 2022-11-28 DIAGNOSIS — I10 Essential (primary) hypertension: Secondary | ICD-10-CM

## 2022-11-28 MED ORDER — AMLODIPINE BESYLATE 5 MG PO TABS: ORAL_TABLET | ORAL | 1 refills | Status: DC

## 2022-11-29 ENCOUNTER — Encounter: Payer: Self-pay | Admitting: Endocrinology

## 2022-11-29 ENCOUNTER — Ambulatory Visit: Payer: Medicare Other | Admitting: Endocrinology

## 2022-11-29 VITALS — BP 122/60 | HR 64 | Resp 20 | Ht 65.0 in | Wt 168.6 lb

## 2022-11-29 DIAGNOSIS — Z794 Long term (current) use of insulin: Secondary | ICD-10-CM | POA: Diagnosis not present

## 2022-11-29 DIAGNOSIS — M810 Age-related osteoporosis without current pathological fracture: Secondary | ICD-10-CM

## 2022-11-29 DIAGNOSIS — E1122 Type 2 diabetes mellitus with diabetic chronic kidney disease: Secondary | ICD-10-CM | POA: Diagnosis not present

## 2022-11-29 DIAGNOSIS — N1832 Chronic kidney disease, stage 3b: Secondary | ICD-10-CM

## 2022-11-29 DIAGNOSIS — T383X5A Adverse effect of insulin and oral hypoglycemic [antidiabetic] drugs, initial encounter: Secondary | ICD-10-CM | POA: Diagnosis not present

## 2022-11-29 DIAGNOSIS — E160 Drug-induced hypoglycemia without coma: Secondary | ICD-10-CM | POA: Diagnosis not present

## 2022-11-29 LAB — POCT GLYCOSYLATED HEMOGLOBIN (HGB A1C): Hemoglobin A1C: 5.4 % (ref 4.0–5.6)

## 2022-11-29 MED ORDER — INSULIN REGULAR HUMAN 100 UNIT/ML IJ SOLN: 5.0000 [IU] | Freq: Two times a day (BID) | INTRAMUSCULAR | 3 refills | Status: DC

## 2022-11-29 MED ORDER — FREESTYLE LIBRE 3 PLUS SENSOR MISC: 1.0000 | 4 refills | Status: AC

## 2022-11-29 MED ORDER — INSULIN NPH (HUMAN) (ISOPHANE) 100 UNIT/ML ~~LOC~~ SUSP: SUBCUTANEOUS | 3 refills | Status: DC

## 2022-11-29 NOTE — Patient Instructions (Signed)
Diabetes regimen: Decrease Novolin N to 20 units in the morning and 10 units in the evening with meals. Decrease regular insulin to 5 units two times a day with meals. 3.   Continue ozempic and metformin same.  Take glucose tablet take 2-4 tablets when glucose is low. It is over the counter.

## 2022-11-29 NOTE — Progress Notes (Signed)
Outpatient Endocrinology Note [place] Sayer Masini, MD  11/29/22  Patient's Name: [name]    DOB: Dec 12, 1940    MRN: [id number]                                                    REASON OF VISIT: Follow up for type 2 diabetes mellitus /osteoporosis  PCP: Wanda Plump, MD  HISTORY OF PRESENT ILLNESS:   [name] is a 82 y.o. old female with past medical history listed below, is here for follow up of type 2 diabetes mellitus / osteoporosis.   Pertinent Diabetes History: Patient was diagnosed with type 2 diabetes mellitus in 1998.  Patient was initially treated with metformin and Amaryl was added later on.  She was started on insulin therapy in 2014.  She had been on Victoza in the past.  Chronic Diabetes Complications : Retinopathy: yes following with ophthalmology.   Nephropathy: CKD 3B, on irbesartan Peripheral neuropathy: no Coronary artery disease: yes, CABG Stroke: no  Relevant comorbidities and cardiovascular risk factors: Obesity: no Body mass index is 28.06 kg/m.  Hypertension: yes Hyperlipidemia. Yes, on a statin  Current / Home Diabetic regimen includes: Novolin N 26 units in the morning and 16 units in the evening. Regular insulin 8 units with breakfast and 8 units with supper. Metformin 500 mg daily. Ozempic 0.5 mg weekly.  Prior diabetic medications: Victoza in the past, changed to Ozempic due to insurance preference in ~ May 2024.  Glycemic data: She has restarted freestyle libre 3 CGM from May 2024.  CONTINUOUS GLUCOSE MONITORING SYSTEM (CGMS) INTERPRETATION: At today's visit, we reviewed CGM downloads. The full report is scanned in the media. Reviewing the CGM trends, blood glucose are as follows:  FreeStyle Libre 3 CGM-  Sensor Download (Sensor download was reviewed and summarized below.) Dates: November 14 to November 27 , 2024, 14 days. Sensor Average: 125 Glucose Management Indicator: 6.3% Glucose Variability: 30.2%  % data captured:  95%  Glycemic Trends:  <54: 1% 54-70: 3% 71-180: 88% 181-250: 3% 251-400: 1%  Interpretation: Patient has occasional hypoglycemia in the early morning with blood sugar in 50-60 range and rare hypoglycemia in the afternoon with blood sugar in upper 60s.  Most of the other days are acceptable blood sugar in the afternoon and overnight.  Blood sugar are trending down overnight when there is no hypoglycemia in the low normal range.  No concerning hyperglycemia.  Hypoglycemia: Patient has frequent hypoglycemic episodes. Patient has hypoglycemia ? awareness.  Factors modifying glucose control: 1.  Diabetic diet assessment: 2-3 meals a day, she sometimes does not eat dinner.  At suppertime she usually does like to snack.  2.  Staying active or exercising: No formal exercise.  3.  Medication compliance: compliant all of the time.  # Osteoporosis : -Osteoporosis was being managed by primary care provider and referred to endocrinology after her DEXA scan in July 18, 2022.  -DEXA scan in July 18, 2022 T-score AP spine L1-L4 -0.7  and Right hip total -2.7 consistent with osteoporosis. ( Left hip not used due to surgery)  DEX scan in 12/2015, T-Score: AP spine -0.8, RFN -2.5, LFN -2.9 consistent with osteoporosis. (GE lunar)  Patient had remote history of foot fracture due to walking.  She had a history of left hip fracture in August 2020.  Treatment history: In August 2020 started on Fosamax and later switched to Prolia due to CKD and difficulty swallowing, first dose of Prolia in December 2021, she has been Prolia every 6 months without missing and last dose was in July 2024.  She is currently taking vitamin D3 1000 international unit daily, multivitamin 1 tablet daily.  She is taking additional calcium.  She eats yogurt daily and fair amount of cheese.  CTX in August 2024 was 175 normal.  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  During the chemotherapy course  she had received multiple course of dexamethasone, which is on 6 factor for continued bone loss.  Patient has remote history of colon cancer. DCIS left breast.   Interval history  CGM data as reviewed above, she is having frequent hypoglycemia.  Diabetes regimen insulin regimen as reviewed above.  Patient was advised to decrease the dose of Novolin and and Novolin R in last visit however she still has been taking higher than suggested dose of insulin as noted above.  She has been taking Ozempic weekly, no GI issues.  In the clinic today she has blood sugar on her freestyle libre reading 62.  Fingerstick blood sugar 61.  She denies any hypoglycemic symptoms.  Concerned about hypoglycemic awareness.  Patient is given orange juice, blood sugar improved to 95.  REVIEW OF SYSTEMS As per history of present illness.   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   Gastritis    GERD (gastroesophageal reflux disease)    Headache(784.0)    History of blood transfusion    "I"ve had 20 some; thru birth of children, loss of blood, last 2 were in ~ 1999 before my cancer surgery" (09/16/2015)   History of hiatal hernia    History of shingles    Hx of colonic polyps    Hyperlipidemia    Hypertension    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HCC) 2000   Myocardial infarction (HCC)    1997   Pneumonia    X 1   Postoperative hematoma involving circulatory system following cardiac catheterization 09/17/2015   S/P cardiac  cath 09/16/15 09/17/2015   Type II diabetes mellitus (HCC)    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[facility])    as a child   Venous insufficiency     PAST SURGICAL HISTORY: Past Surgical History:  Procedure Laterality Date   BREAST BIOPSY Left 03/10/2022   MM LT BREAST BX W LOC DEV 1ST LESION IMAGE BX SPEC STEREO GUIDE 03/10/2022 [facility]   BREAST BIOPSY  05/01/2022   MM LT RADIOACTIVE SEED LOC MAMMO GUIDE 05/01/2022 [facility]   BREAST LUMPECTOMY WITH RADIOACTIVE SEED LOCALIZATION Left 05/02/2022   Procedure: LEFT BREAST LUMPECTOMY WITH RADIOACTIVE SEED LOCALIZATION;  Surgeon: Harriette Bouillon, MD;  Location: [facility];  Service: General;  Laterality: Left;   CARDIAC CATHETERIZATION  1997; 2008; 09/16/2015  CARDIAC CATHETERIZATION N/A 09/16/2015   Procedure: Right/Left Heart Cath and Coronary/Graft Angiography;  Surgeon: Runell Gess, MD;  Location: [facility] INVASIVE CV LAB;  Service: Cardiovascular;  Laterality: N/A;   CESAREAN SECTION  1966   COLONOSCOPY     CORONARY ARTERY BYPASS GRAFT  1997   CABG X5   EYE SURGERY     2 laser surgeries on left eye with implant   EYE SURGERY Bilateral    cataracts   FEMUR IM NAIL Left 08/28/2018   Procedure: INTRAMEDULLARY (IM) NAIL FEMORAL;  Surgeon: Samson Frederic, MD;  Location: [facility];  Service: Orthopedics;  Laterality: Left;   FRACTURE SURGERY     HERNIA REPAIR     LAPAROSCOPIC ASSISTED VENTRAL HERNIA REPAIR  09/2008    with incarcerated colon;  Dr. 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 Samuella Cota   SHOULDER ARTHROSCOPY W/  ROTATOR CUFF REPAIR Right 1997   SMALL INTESTINE SURGERY     TONSILLECTOMY  1960s   TOTAL KNEE ARTHROPLASTY WITH HARDWARE REMOVAL Left 12/1994   (infex, hardware removed )- not replacement per patient   TUBAL LIGATION  1966    ALLERGIES: Allergies  Allergen Reactions   Codeine Other (See Comments)    REACTION: makes her nervous, orTylenol #3   Ibuprofen Other (See Comments)    REACTION: nervous   Meperidine Hcl Nausea And Vomiting   Naproxen Sodium Other (See Comments)    Or Advil  REACTION: nervous   Tramadol Other (See Comments)    Insomnia    Tylenol With Codeine #3 [Acetaminophen-Codeine] Nausea And Vomiting    FAMILY HISTORY:  Family History  Problem Relation Age of Onset   Heart disease Mother 57   Heart disease Father 7       MI   Hypertension Child    Heart attack Child    Diabetes Child    Heart disease Maternal Aunt        x 2, all deceased   Heart disease Maternal Uncle        x 4, all deceased   Emphysema Brother 42   Colon cancer Neg Hx    Breast cancer Neg Hx    Rectal cancer Neg Hx    Stomach cancer Neg Hx     SOCIAL HISTORY: Social History   Socioeconomic History   Marital status: Widowed    Spouse name: Not on file   Number of children: 2   Years of education: Not on file   Highest education level: Not on file  Occupational History   Occupation: retired-- [employer]     Employer: RETIRED  Tobacco Use   Smoking status: Never   Smokeless tobacco: Never  Vaping Use   Vaping status: Never Used  Substance and Sexual Activity   Alcohol use: No    Alcohol/week: 0.0 standard drinks of alcohol   Drug use: No   Sexual activity: Not Currently  Other Topics Concern   Not on file  Social History Narrative   Lives by herself,  lost husband ~ 2004 , still drives    1 child in [place]   I child in [place]   Social Determinants of Health   Financial Resource Strain: Low Risk  (06/29/2022)   Overall Financial Resource Strain (CARDIA)    Difficulty  of Paying Living Expenses: Not hard at all  Food Insecurity: No Food Insecurity (06/29/2022)   Hunger Vital Sign    Worried About Running Out of Food in the Last Year: Never true    Ran Out of Food in the Last Year: Never true  Transportation Needs: No Transportation Needs (06/29/2022)   PRAPARE - Administrator, Civil Service (Medical): No    Lack of Transportation (Non-Medical): No  Physical Activity: Inactive (06/29/2022)   Exercise Vital Sign    Days of Exercise per Week: 0 days    Minutes of Exercise per Session: 0 min  Stress: No Stress Concern Present (06/29/2022)   [employer] of Occupational Health - Occupational Stress Questionnaire    Feeling of Stress : Only a little  Social Connections: Moderately Integrated (06/29/2022)   Social Connection and Isolation Panel [NHANES]    Frequency of Communication with Friends and Family: More than three times a week    Frequency of Social Gatherings with Friends and Family: More than three times a week    Attends Religious Services: More than 4 times per year    Active Member of [employer] or Organizations: Yes    Attends Banker Meetings: More than 4 times per year    Marital Status: Widowed    MEDICATIONS:  Current Outpatient Medications  Medication Sig Dispense Refill   acetaminophen (TYLENOL) 500 MG tablet Take 1 tablet (500 mg total) by mouth every 6 (six) hours as needed. 30 tablet 0   amLODipine (NORVASC) 5 MG tablet Take 1 tablet by mouth daily 90 tablet 1   aspirin 81 MG chewable tablet Chew 1 tablet (81 mg total) by mouth daily.     atenolol (TENORMIN) 100 MG tablet Take 1 tablet (100 mg total) by mouth daily. 90 tablet 1   atorvastatin (LIPITOR) 20 MG tablet TAKE 1 TABLET(20 MG) BY MOUTH DAILY 90 tablet 3   Calcium Carb-Cholecalciferol (CALCIUM 500+D PO) Take by mouth.     clopidogrel (PLAVIX) 75 MG tablet Take 1 tablet (75 mg total) by mouth daily. 90 tablet 1   Continuous Glucose Sensor (FREESTYLE  LIBRE 3 PLUS SENSOR) MISC 1 each by Does not apply route continuous. Change every 15 days. 6 each 4   diazepam (VALIUM) 5 MG tablet Take 1 tablet (5 mg total) by mouth every 6 (six) hours as needed for anxiety. 20 tablet 0   fluticasone (FLONASE) 50 MCG/ACT nasal spray SHAKE LIQUID AND USE 2 SPRAYS IN EACH NOSTRIL DAILY 48 g 3   Glucagon (GVOKE HYPOPEN 1-PACK) 1 MG/0.2ML SOAJ Inject 1 mg into the skin as needed (low blood sugar with impaired consciousness). 0.4 mL 2   glucose blood (ONETOUCH ULTRA) test strip USE AS DIRECTED THREE TIMES DAILY 100 strip 5   Insulin Syringe-Needle U-100 (INSULIN SYRINGE .5CC/31GX5/16") 31G X 5/16" 0.5 ML MISC USE TO INJECT INSULIN 5 TIMES PER DAY 200 each 0   irbesartan (AVAPRO) 300 MG tablet TAKE 1/2 TABLET(150 MG) BY MOUTH DAILY 45 tablet 3   isosorbide mononitrate (IMDUR) 30 MG 24 hr tablet TAKE 1 TABLET BY MOUTH EVERY DAY 90 tablet 3   metFORMIN (GLUCOPHAGE) 500 MG tablet TAKE 1 TABLET(500 MG) BY MOUTH  DAILY WITH SUPPER 90 tablet 1   mometasone (ELOCON) 0.1 % cream Apply 1 Application topically daily as needed (itchy ears). Use as directed     Multiple Vitamin (MULTIVITAMIN) capsule Take 1 capsule by mouth daily.     omeprazole (PRILOSEC) 20 MG capsule TAKE 1 CAPSULE(20 MG) BY MOUTH DAILY 90 capsule 3   ondansetron (ZOFRAN) 4 MG tablet Take 1 tablet (4 mg total) by mouth every 8 (eight) hours as needed for nausea or vomiting. 20 tablet 0   oxyCODONE (OXY IR/ROXICODONE) 5 MG immediate release tablet Take 1 tablet (5 mg total) by mouth every 6 (six) hours as needed for severe pain. 15 tablet 0   Semaglutide,0.25 or 0.5MG /DOS, (OZEMPIC, 0.25 OR 0.5 MG/DOSE,) 2 MG/3ML SOPN Inject 0.5 mg into the skin every Monday. 9 mL 0   venetoclax (VENCLEXTA) 100 MG tablet Take 1 tablet (100 mg total) by mouth daily. Tablets should be swallowed whole with a meal and a full glass of water. 28 tablet 3   insulin NPH Human (NOVOLIN N) 100 UNIT/ML injection Inject 20 units into the skin  every morning and inject 10 units into the skin at evening meal. 10 mL 3   insulin regular (NOVOLIN R) 100 units/mL injection Inject 0.05 mLs (5 Units total) into the skin 2 (two) times daily before a meal. 10 mL 3   No current facility-administered medications for this visit.   Facility-Administered Medications Ordered in Other Visits  Medication Dose Route Frequency Provider Last Rate Last Admin   vancomycin (VANCOCIN) 1,000 mg in sodium chloride 0.9 % 500 mL IVPB  1,000 mg Intravenous Once Cornett, Thomas, MD        PHYSICAL EXAM: Vitals:   11/29/22 1416  BP: 122/60  Pulse: 64  Resp: 20  SpO2: 98%  Weight: 168 lb 9.6 oz (76.5 kg)  Height: 5\' 5"  (1.651 m)   Body mass index is 28.06 kg/m.  Wt Readings from Last 3 Encounters:  11/29/22 168 lb 9.6 oz (76.5 kg)  11/17/22 167 lb 8 oz (76 kg)  10/30/22 165 lb (74.8 kg)    General: Well developed, well nourished female in no apparent distress.  HEENT: AT/[place], no external lesions.  Eyes: Conjunctiva clear and no icterus. Neck: Neck supple  Lungs: Respirations not labored Neurologic: Alert, oriented, normal speech Extremities / Skin: Dry. No sores or rashes noted.  No spine tenderness. Psychiatric: Does not appear depressed or anxious  Diabetic Foot Exam - Simple   No data filed    LABS Reviewed Lab Results  Component Value Date   HGBA1C 5.4 11/29/2022   HGBA1C 5.0 08/31/2022   HGBA1C 5.2 04/26/2022   Lab Results  Component Value Date   FRUCTOSAMINE 205 05/11/2022   FRUCTOSAMINE 207 10/25/2021   FRUCTOSAMINE 230 06/28/2021   Lab Results  Component Value Date   CHOL 110 07/17/2022   HDL 37.20 (L) 07/17/2022   LDLCALC 41 05/05/2020   LDLDIRECT 38.0 07/17/2022   TRIG 262.0 (H) 07/17/2022   CHOLHDL 3 07/17/2022   Lab Results  Component Value Date   MICRALBCREAT 14.5 05/11/2022   MICRALBCREAT 12.7 12/09/2020   Lab Results  Component Value Date   CREATININE 1.41 (H) 10/30/2022   Lab Results  Component Value  Date   GFR 40.00 (L) 08/31/2022    ASSESSMENT / PLAN  1. Type 2 diabetes mellitus with stage 3b chronic kidney disease, with long-term current use of insulin (HCC)   2. Age-related osteoporosis without current pathological fracture  3. Hypoglycemia due to insulin      Diabetes Mellitus type 2, complicated by CKD/ CAD - Diabetic status / severity: Controlled with hypoglycemia.  Lab Results  Component Value Date   HGBA1C 5.4 11/29/2022    - Hemoglobin A1c goal : <7% Still with hypoglycemia.  Hemoglobin A1c today is 5.4% probably falsely low due to anemia as well.  - Medications: Decrease insulin doses due to hypoglycemia as follows.  Diabetes regimen: Decrease Novolin N from 26 to 20 units in the morning and from 16 to 10 units in the evening with meals. Decrease regular insulin from 8 to 5 units two times a day with meals. 3.   Continue ozempic and metformin same.  Take glucose tablet take 2-4 tablets when glucose is low. It is over the counter.  - Home glucose testing: Continue CGM/freestyle libre 3+, sent new prescription.  Check blood sugar as needed.  - Discussed/ Gave Hypoglycemia treatment plan.  In detail about risks of hypoglycemia.  She is asked to get glucose tablet over-the-counter and take 2 to 4 tablets for correction of hypoglycemia.  Patient lives by herself not able to use Glucagon Emergency Kit, sent prescription for glucagon kit as well.  Discussed that hypoglycemia can be immediately and acutely dangerous and even life-threatening.  He strongly recommended to follow the insulin regimen suggested in the clinic today.  # Consult : not required at this time.   # Annual urine for microalbuminuria/ creatinine ratio, no microalbuminuria currently, continue ACE/ARB /irbesartan. Last  Lab Results  Component Value Date   MICRALBCREAT 14.5 05/11/2022    # Foot check nightly / neuropathy.  # Annual dilated diabetic eye exams.   2. Blood pressure  -  BP  Readings from Last 1 Encounters:  11/29/22 122/60    - Control is in target.  - No change in current plans.  3. Lipid status / Hyperlipidemia - Last  Lab Results  Component Value Date   LDLCALC 41 05/05/2020   - Continue atorvastatin 20 mg daily.  # Osteoporosis -Patient had recent DEXA scan in July 2024 consistent with osteoporosis based on lowest T-score of -2.7 at right hip and T-score at lumbar spine -0.7.  She had DEXA scan in 2017 with lowest T-score of -2.9 at femoral neck and T-score of -0.8 at lumbar spine consistent with osteoporosis. -She was treated with Fosamax initially started in August 2020 and switched to Prolia.  First dose was in December 2021, she has received Prolia every 6 months and last dose was in July 2024. -She has no recent fracture. -Not able to compare bone density done in 2017 and bone density in July 2024 as they were done in different places.  However overall it seems to be stable, over the period of 7 years, despite she had received multiple doses of dexamethasone for the treatment of CLL in last 2 years which is a risk for bone loss.  Plan: -Continue Prolia 60 mg subcutaneous every 6 months.  Next Prolia injection in January 2025. -Continue current dose of vitamin D and calcium. Take calcium 500 mg daily in addition to dietary calcium and multivitamin. -Discussed about fall precautions.  Diagnoses and all orders for this visit:  Type 2 diabetes mellitus with stage 3b chronic kidney disease, with long-term current use of insulin (HCC) -     POCT glycosylated hemoglobin (Hb A1C)  Age-related osteoporosis without current pathological fracture  Hypoglycemia due to insulin  Other orders -  insulin NPH Human (NOVOLIN N) 100 UNIT/ML injection; Inject 20 units into the skin every morning and inject 10 units into the skin at evening meal. -     insulin regular (NOVOLIN R) 100 units/mL injection; Inject 0.05 mLs (5 Units total) into the skin 2 (two)  times daily before a meal. -     Continuous Glucose Sensor (FREESTYLE LIBRE 3 PLUS SENSOR) MISC; 1 each by Does not apply route continuous. Change every 15 days.    DISPOSITION Follow up in clinic in 6 weeks suggested.   All questions answered and patient verbalized understanding of the plan.  [place]ra[place] Sarae Nicholes, MD [facility] Endocrinology [facility] [address] [place], [address] [place], [place] [address] Phone # [phone]  At least part of this note was generated using voice recognition software. Inadvertent word errors may have occurred, which were not recognized during the proofreading process.

## 2022-12-02 ENCOUNTER — Other Ambulatory Visit: Payer: Self-pay | Admitting: Endocrinology

## 2022-12-02 ENCOUNTER — Other Ambulatory Visit: Payer: Self-pay | Admitting: Hematology & Oncology

## 2022-12-04 ENCOUNTER — Encounter: Payer: Self-pay | Admitting: Family

## 2022-12-14 ENCOUNTER — Other Ambulatory Visit (HOSPITAL_COMMUNITY): Payer: Self-pay

## 2022-12-18 ENCOUNTER — Other Ambulatory Visit: Payer: Self-pay

## 2022-12-18 MED ORDER — DENOSUMAB 60 MG/ML ~~LOC~~ SOSY
60.0000 mg | PREFILLED_SYRINGE | Freq: Once | SUBCUTANEOUS | Status: AC
  Administered 2023-01-18: 60 mg via SUBCUTANEOUS

## 2022-12-18 NOTE — Addendum Note (Signed)
Addended by: Thelma Barge D on: 12/18/2022 08:22 AM   Modules accepted: Orders

## 2022-12-18 NOTE — Telephone Encounter (Signed)
CAM order placed.

## 2022-12-20 ENCOUNTER — Other Ambulatory Visit: Payer: Self-pay

## 2022-12-25 ENCOUNTER — Telehealth: Payer: Self-pay | Admitting: *Deleted

## 2022-12-25 NOTE — Telephone Encounter (Signed)
Please run benefits.  Last Prolia done 07/17/22 Next Prolia due 01/18/23  CAM order has been placed.

## 2022-12-29 ENCOUNTER — Encounter: Payer: Self-pay | Admitting: Family

## 2022-12-29 ENCOUNTER — Telehealth: Payer: Self-pay

## 2022-12-29 ENCOUNTER — Other Ambulatory Visit (HOSPITAL_COMMUNITY): Payer: Self-pay

## 2022-12-29 NOTE — Telephone Encounter (Signed)
PA for 2025 unable to be started until the plan year is active.

## 2022-12-29 NOTE — Telephone Encounter (Signed)
PA needed per [employer] for patient's ozempic for upcoming year

## 2023-01-05 ENCOUNTER — Telehealth: Payer: Self-pay

## 2023-01-05 NOTE — Telephone Encounter (Signed)
 Prolia VOB initiated via [email or web address]  Next Prolia inj DUE: 01/15/23

## 2023-01-07 ENCOUNTER — Other Ambulatory Visit (HOSPITAL_COMMUNITY): Payer: Self-pay

## 2023-01-07 ENCOUNTER — Telehealth: Payer: Self-pay

## 2023-01-07 ENCOUNTER — Encounter: Payer: Self-pay | Admitting: Family

## 2023-01-07 NOTE — Telephone Encounter (Signed)
 Oral Oncology Patient Advocate Encounter  Was successful in securing patient a $8,000.00 grant from [facility] to provide copayment coverage for Venclexta .  This will keep the out of pocket expense at $0.     [employer] ID: [id number]   The billing information is as follows and has been shared with [facility].    RxBin: [id number] PCN: [id number] Member ID: [id number] Group ID: [id number] Dates of Eligibility: 10/28/22 through 10/27/23  Fund:  Chronic Lymphocytic Leukemia   Morene Potters, CPhT Oncology Pharmacy Patient Advocate  [facility]  [phone] (phone) [phone] (fax)

## 2023-01-08 ENCOUNTER — Encounter: Payer: Self-pay | Admitting: Hematology & Oncology

## 2023-01-08 ENCOUNTER — Inpatient Hospital Stay (HOSPITAL_BASED_OUTPATIENT_CLINIC_OR_DEPARTMENT_OTHER): Payer: Medicare Other | Admitting: Hematology & Oncology

## 2023-01-08 ENCOUNTER — Inpatient Hospital Stay: Payer: Medicare Other | Attending: Hematology & Oncology

## 2023-01-08 ENCOUNTER — Other Ambulatory Visit (HOSPITAL_COMMUNITY): Payer: Self-pay

## 2023-01-08 VITALS — BP 145/65 | HR 63 | Temp 98.1°F | Resp 20 | Ht 65.0 in | Wt 168.1 lb

## 2023-01-08 DIAGNOSIS — D0512 Intraductal carcinoma in situ of left breast: Secondary | ICD-10-CM | POA: Diagnosis not present

## 2023-01-08 DIAGNOSIS — Z7984 Long term (current) use of oral hypoglycemic drugs: Secondary | ICD-10-CM | POA: Diagnosis not present

## 2023-01-08 DIAGNOSIS — C9110 Chronic lymphocytic leukemia of B-cell type not having achieved remission: Secondary | ICD-10-CM | POA: Insufficient documentation

## 2023-01-08 DIAGNOSIS — Z85038 Personal history of other malignant neoplasm of large intestine: Secondary | ICD-10-CM | POA: Diagnosis not present

## 2023-01-08 DIAGNOSIS — Z7902 Long term (current) use of antithrombotics/antiplatelets: Secondary | ICD-10-CM | POA: Insufficient documentation

## 2023-01-08 DIAGNOSIS — D509 Iron deficiency anemia, unspecified: Secondary | ICD-10-CM | POA: Insufficient documentation

## 2023-01-08 LAB — CMP (CANCER CENTER ONLY)
ALT: 17 U/L (ref 0–44)
AST: 19 U/L (ref 15–41)
Albumin: 4.1 g/dL (ref 3.5–5.0)
Alkaline Phosphatase: 82 U/L (ref 38–126)
Anion gap: 8 (ref 5–15)
BUN: 20 mg/dL (ref 8–23)
CO2: 28 mmol/L (ref 22–32)
Calcium: 9.4 mg/dL (ref 8.9–10.3)
Chloride: 107 mmol/L (ref 98–111)
Creatinine: 1.38 mg/dL — ABNORMAL HIGH (ref 0.44–1.00)
GFR, Estimated: 38 mL/min — ABNORMAL LOW (ref >60)
Glucose, Bld: 185 mg/dL — ABNORMAL HIGH (ref 70–99)
Potassium: 4.1 mmol/L (ref 3.5–5.1)
Sodium: 143 mmol/L (ref 135–145)
Total Bilirubin: 0.5 mg/dL (ref 0.0–1.2)
Total Protein: 6.7 g/dL (ref 6.5–8.1)

## 2023-01-08 LAB — CBC WITH DIFFERENTIAL (CANCER CENTER ONLY)
Abs Immature Granulocytes: 0.01 10*3/uL (ref 0.00–0.07)
Basophils Absolute: 0 10*3/uL (ref 0.0–0.1)
Basophils Relative: 0 %
Eosinophils Absolute: 0 10*3/uL (ref 0.0–0.5)
Eosinophils Relative: 0 %
HCT: 33.4 % — ABNORMAL LOW (ref 36.0–46.0)
Hemoglobin: 11.1 g/dL — ABNORMAL LOW (ref 12.0–15.0)
Immature Granulocytes: 0 %
Lymphocytes Relative: 31 %
Lymphs Abs: 1.4 10*3/uL (ref 0.7–4.0)
MCH: 33.1 pg (ref 26.0–34.0)
MCHC: 33.2 g/dL (ref 30.0–36.0)
MCV: 99.7 fL (ref 80.0–100.0)
Monocytes Absolute: 0.5 10*3/uL (ref 0.1–1.0)
Monocytes Relative: 12 %
Neutro Abs: 2.5 10*3/uL (ref 1.7–7.7)
Neutrophils Relative %: 57 %
Platelet Count: 176 10*3/uL (ref 150–400)
RBC: 3.35 MIL/uL — ABNORMAL LOW (ref 3.87–5.11)
RDW: 12.8 % (ref 11.5–15.5)
WBC Count: 4.5 10*3/uL (ref 4.0–10.5)
nRBC: 0 % (ref 0.0–0.2)

## 2023-01-08 LAB — IRON AND IRON BINDING CAPACITY (CC-WL,HP ONLY)
Iron: 93 ug/dL (ref 28–170)
Saturation Ratios: 29 % (ref 10.4–31.8)
TIBC: 321 ug/dL (ref 250–450)
UIBC: 228 ug/dL (ref 148–442)

## 2023-01-08 LAB — FERRITIN: Ferritin: 98 ng/mL (ref 11–307)

## 2023-01-08 LAB — SAVE SMEAR(SSMR), FOR PROVIDER SLIDE REVIEW

## 2023-01-08 NOTE — Progress Notes (Signed)
 Hematology and Oncology Follow Up Visit  [name] [id number] 1940/05/14 83 y.o. 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 Dettloff is here today for follow-up.  She made it through the [place].  I know this was a difficult time for her symptoms her son passed away earlier in the year.  Her blood sugars have been on the higher side.  Again, she sees her endocrinologist for this.  She has had no problem with swollen lymph nodes.  She has had no fever.  She has had no change in bowel or bladder habits.  There has been no bleeding.  She has had no leg swelling.  Looks that she may have a little bit of a squamous cell or basal cell on the pinna of her right ear.  She is to see her dermatologist for this.  She has been off any therapy for the CLL now for almost a year.  She has a DCIS of the left breast.  She underwent lumpectomy back in April.  Everything is doing well with this.  Overall, I would have to say that her performance status is probably ECOG 1. The   Medications:  Allergies as of 01/08/2023       Reactions   Codeine Other (See Comments)   REACTION: makes her nervous, orTylenol #3   Ibuprofen Other (See Comments)   REACTION: nervous   Meperidine  Hcl Nausea And Vomiting   Naproxen Sodium Other (See Comments)   Or Advil REACTION: nervous   Tramadol  Other (See Comments)   Insomnia    Tylenol  With Codeine #3 [acetaminophen -codeine] Nausea And Vomiting        Medication List        Accurate as of January 08, 2023 12:52 PM. If you have any  questions, ask your nurse or doctor.          STOP taking these medications    oxyCODONE  5 MG immediate release tablet Commonly known as: Oxy IR/ROXICODONE  Stopped by: Leocadia Idleman R Steffi Noviello       TAKE these medications    acetaminophen  500 MG tablet Commonly known as: TYLENOL  Take 1 tablet (500 mg total) by mouth every 6 (six) hours as needed.   amLODipine  5 MG tablet Commonly known as: NORVASC  Take 1 tablet by mouth daily   aspirin  81 MG chewable tablet Chew 1 tablet (81 mg total) by mouth daily.   atenolol  100 MG tablet Commonly known as: TENORMIN  Take 1 tablet (100 mg total) by mouth daily.   atorvastatin  20 MG tablet Commonly known as: LIPITOR TAKE 1 TABLET(20 MG) BY MOUTH DAILY   Azelastine  HCl 137 MCG/SPRAY Soln Place into the nose at bedtime as needed.   CALCIUM  500+D PO Take by mouth daily.   clopidogrel  75 MG tablet Commonly known as: PLAVIX  Take 1 tablet (75 mg total) by mouth daily.   diazepam  5 MG tablet Commonly known as: VALIUM  Take 1 tablet (5 mg total) by mouth every 6 (six) hours as needed for anxiety.   fluticasone  50 MCG/ACT nasal spray Commonly known as: FLONASE  SHAKE LIQUID  AND USE 2 SPRAYS IN EACH NOSTRIL DAILY   FreeStyle Libre 3 Plus Sensor Misc 1 each by Does not apply route continuous. Change every 15 days.   Gvoke HypoPen  1-Pack 1 MG/0.2ML Soaj Generic drug: Glucagon  Inject 1 mg into the skin as needed (low blood sugar with impaired consciousness).   insulin  NPH Human 100 UNIT/ML injection Commonly known as: NOVOLIN N Inject 20 units into the skin every morning and inject 10 units into the skin at evening meal.   insulin  regular 100 units/mL injection Commonly known as: NOVOLIN R Inject 0.05 mLs (5 Units total) into the skin 2 (two) times daily before a meal.   INSULIN  SYRINGE .5CC/31GX5/16 31G X 5/16 0.5 ML Misc USE TO INJECT INSULIN  5 TIMES PER DAY   irbesartan  300 MG tablet Commonly known as: AVAPRO  TAKE 1/2  TABLET(150 MG) BY MOUTH DAILY   isosorbide  mononitrate 30 MG 24 hr tablet Commonly known as: IMDUR  TAKE 1 TABLET BY MOUTH EVERY DAY   metFORMIN  500 MG tablet Commonly known as: GLUCOPHAGE  TAKE 1 TABLET(500 MG) BY MOUTH DAILY WITH SUPPER   mometasone 0.1 % cream Commonly known as: ELOCON Apply 1 Application topically daily as needed (itchy ears). Use as directed   multivitamin capsule Take 1 capsule by mouth daily.   omeprazole  20 MG capsule Commonly known as: PRILOSEC TAKE 1 CAPSULE(20 MG) BY MOUTH DAILY   ondansetron  4 MG tablet Commonly known as: Zofran  Take 1 tablet (4 mg total) by mouth every 8 (eight) hours as needed for nausea or vomiting.   OneTouch Ultra test strip Generic drug: glucose blood USE AS DIRECTED THREE TIMES DAILY   Ozempic  (0.25 or 0.5 MG/DOSE) 2 MG/3ML Sopn Generic drug: Semaglutide (0.25 or 0.5MG /DOS) Inject 0.5 mg into the skin every Monday.   Venclexta  100 MG tablet Generic drug: venetoclax  Take 1 tablet (100 mg total) by mouth daily. Tablets should be swallowed whole with a meal and a full glass of water.        Allergies:  Allergies  Allergen Reactions   Codeine Other (See Comments)    REACTION: makes her nervous, orTylenol #3   Ibuprofen Other (See Comments)    REACTION: nervous   Meperidine  Hcl Nausea And Vomiting   Naproxen Sodium Other (See Comments)    Or Advil  REACTION: nervous   Tramadol  Other (See Comments)    Insomnia    Tylenol  With Codeine #3 [Acetaminophen -Codeine] Nausea And Vomiting    Past Medical History, Surgical history, Social history, and Family History were reviewed and updated.  Review of Systems: Review of Systems  Constitutional: Negative.   HENT: Negative.    Eyes: Negative.   Respiratory: Negative.    Cardiovascular: Negative.   Gastrointestinal: Negative.   Genitourinary: Negative.   Musculoskeletal: Negative.   Skin: Negative.   Neurological: Negative.   Endo/Heme/Allergies: Negative.    Psychiatric/Behavioral: Negative.       Physical Exam:  height is 5' 5 (1.651 m) and weight is 168 lb 1.3 oz (76.2 kg). Her oral temperature is 98.1 F (36.7 C). Her blood pressure is 145/65 (abnormal) and her pulse is 63. Her respiration is 20 and oxygen saturation is 99%.   Wt Readings from Last 3 Encounters:  01/08/23 168 lb 1.3 oz (76.2 kg)  11/29/22 168 lb 9.6 oz (76.5 kg)  11/17/22 167 lb 8 oz (76 kg)    Physical Exam Vitals reviewed.  Constitutional:      Comments: Her breast exam shows right breast no masses, edema  or erythema.  There is no right axillary adenopathy.  Left breast shows the lumpectomy scar at the rim of the areola.  This is at about the 12 o'clock position.  This is healing.  There is a little bit of ecchymoses.  There is a little bit of tenderness.  There is no erythema or swelling.  There is no left axillary adenopathy.  HENT:     Head: Normocephalic and atraumatic.  Eyes:     Pupils: Pupils are equal, round, and reactive to light.  Cardiovascular:     Rate and Rhythm: Normal rate and regular rhythm.     Heart sounds: Normal heart sounds.  Pulmonary:     Effort: Pulmonary effort is normal.     Breath sounds: Normal breath sounds.  Abdominal:     General: Bowel sounds are normal.     Palpations: Abdomen is soft.  Musculoskeletal:        General: No tenderness or deformity. Normal range of motion.     Cervical back: Normal range of motion.  Lymphadenopathy:     Cervical: No cervical adenopathy.  Skin:    General: Skin is warm and dry.     Findings: No erythema or rash.  Neurological:     Mental Status: She is alert and oriented to person, place, and time.  Psychiatric:        Behavior: Behavior normal.        Thought Content: Thought content normal.        Judgment: Judgment normal.     Lab Results  Component Value Date   WBC 4.5 01/08/2023   HGB 11.1 (L) 01/08/2023   HCT 33.4 (L) 01/08/2023   MCV 99.7 01/08/2023   PLT 176 01/08/2023    Lab Results  Component Value Date   FERRITIN 118 08/22/2022   IRON 98 08/22/2022   TIBC 291 08/22/2022   UIBC 193 08/22/2022   IRONPCTSAT 34 (H) 08/22/2022   Lab Results  Component Value Date   RETICCTPCT 2.2 08/22/2022   RBC 3.35 (L) 01/08/2023   Lab Results  Component Value Date   KPAFRELGTCHN 32.8 (H) 05/16/2021   LAMBDASER 26.4 (H) 05/16/2021   KAPLAMBRATIO 1.24 05/16/2021   Lab Results  Component Value Date   IGGSERUM 845 08/22/2022   IGA 185 08/22/2022   IGMSERUM 37 08/22/2022   Lab Results  Component Value Date   TOTALPROTELP 6.5 11/18/2020   ALBUMINELP 3.6 11/18/2020   A1GS 0.3 11/18/2020   A2GS 0.9 11/18/2020   BETS 1.0 11/18/2020   BETA2SER 6.2 02/25/2014   GAMS 0.7 11/18/2020   MSPIKE Not Observed 11/18/2020   SPEI * 02/25/2014     Chemistry      Component Value Date/Time   NA 143 01/08/2023 1138   NA 147 (H) 11/03/2016 1018   NA 143 08/30/2016 0954   K 4.1 01/08/2023 1138   K 3.9 11/03/2016 1018   K 4.5 08/30/2016 0954   CL 107 01/08/2023 1138   CL 110 (H) 11/03/2016 1018   CO2 28 01/08/2023 1138   CO2 27 11/03/2016 1018   CO2 24 08/30/2016 0954   BUN 20 01/08/2023 1138   BUN 25 (H) 11/03/2016 1018   BUN 20.5 08/30/2016 0954   CREATININE 1.38 (H) 01/08/2023 1138   CREATININE 1.6 (H) 11/03/2016 1018   CREATININE 1.4 (H) 08/30/2016 0954      Component Value Date/Time   CALCIUM  9.4 01/08/2023 1138   CALCIUM  10.1 11/03/2016 1018   CALCIUM  9.8  08/30/2016 0954   ALKPHOS 82 01/08/2023 1138   ALKPHOS 112 (H) 11/03/2016 1018   ALKPHOS 113 08/30/2016 0954   AST 19 01/08/2023 1138   AST 23 08/30/2016 0954   ALT 17 01/08/2023 1138   ALT 29 11/03/2016 1018   ALT 20 08/30/2016 0954   BILITOT 0.5 01/08/2023 1138   BILITOT 0.38 08/30/2016 0954      Impression and Plan: Ms. Boch is a very pleasant 83 yo caucasian female with CLL.  So far, we have not had any negative prognostic markers.  She does have the 13q-chromosomal abnormality.  This  is typically construed as a good prognostic marker.  She also has  DCIS of the left breast.  She has had multiple surgeries.  She had a excision after her positive margin.  Thankfully, the reexcision did not show any malignancy.  Again, she did not wish to have any radiation therapy for this.  I can certainly understand this.  At this point, I will plan to get her back to see us  in about 2 months or so.  We will try to get her through the Winter so she does not to come back in any inclement weather.   Maude JONELLE Crease, MD 1/6/202512:52 PM

## 2023-01-08 NOTE — Telephone Encounter (Signed)
 Marland Kitchen

## 2023-01-08 NOTE — Telephone Encounter (Signed)
 Pt ready for scheduling for Prolia  on or after : 01/15/23  Out-of-pocket cost due at time of visit: $0 (medical buy and bill)  Number of injection/visits approved: 2  Primary: [employer]  - Medicare Prolia  co-insurance: 0% Admin fee co-insurance: 0%  Secondary: [employer] - Medicare Prolia  co-insurance: 100% Admin fee co-insurance: 100%  Medical Benefit Details: Date Benefits were checked: 01/05/23 Deductible: $257/ Coinsurance: 100%/ Admin Fee: 100%  Prior Auth: not required PA# Expiration Date:   # of doses approved:  Pharmacy benefit: Copay $807.14 If patient wants fill through the pharmacy benefit please send prescription to:  [facility] , and include estimated need by date in rx notes. Pharmacy will ship medication directly to the office.  Patient not eligible for Prolia  Copay Card. Copay Card can make patient's cost as little as $25. Link to apply: [email or web address]://www.amgensupportplus.com/copay  ** This summary of benefits is an estimation of the patient's out-of-pocket cost. Exact cost may very based on individual plan coverage.

## 2023-01-08 NOTE — Progress Notes (Signed)
 01/08/2023 BP remains elevated, 145/65, instructed to monitor at home and if it remains over 140/90 , notify PCP. Verbalized understanding.

## 2023-01-09 ENCOUNTER — Telehealth: Payer: Self-pay

## 2023-01-09 ENCOUNTER — Encounter: Payer: Self-pay | Admitting: *Deleted

## 2023-01-09 LAB — SURGICAL PATHOLOGY

## 2023-01-09 NOTE — Progress Notes (Signed)
 Patient medically stable. She continues to decline any additional treatment for her DCIS after lumpectomy. Dr Timmy is in agreement as her path didn't show any high risk features.   With patient on observation, and without navigational needs, will discontinue active navigation but be available to the patient as needed in the future.  Oncology Nurse Navigator Documentation     01/09/2023    8:45 AM  Oncology Nurse Navigator Flowsheets  Navigation Complete Date: 01/09/2023  Post Navigation: Continue to Follow Patient? No  Reason Not Navigating Patient: No Treatment, Observation Only  Navigator Location [facility]  Navigator Encounter Type Appt/Treatment Plan Review  Patient Visit Type MedOnc  Treatment Phase Post-Tx Follow-up  Barriers/Navigation Needs No Barriers At This Time  Interventions None Required  Acuity Level 1-No Barriers  Time Spent with Patient 15

## 2023-01-09 NOTE — Telephone Encounter (Signed)
-----   Message from Josph Macho sent at 01/08/2023  4:05 PM EST ----- Call- the iron level is ok!!!   Cindee Lame

## 2023-01-09 NOTE — Telephone Encounter (Signed)
 Pt has been scheduled.

## 2023-01-09 NOTE — Telephone Encounter (Signed)
Called and informed patient of lab results, patient verbalized understanding and denies any questions or concerns at this time.   

## 2023-01-10 ENCOUNTER — Other Ambulatory Visit: Payer: Self-pay

## 2023-01-10 LAB — FLOW CYTOMETRY

## 2023-01-11 ENCOUNTER — Other Ambulatory Visit: Payer: Self-pay

## 2023-01-15 ENCOUNTER — Ambulatory Visit: Payer: Medicare Other | Admitting: Cardiovascular Disease

## 2023-01-18 ENCOUNTER — Ambulatory Visit (INDEPENDENT_AMBULATORY_CARE_PROVIDER_SITE_OTHER): Payer: Medicare Other | Admitting: Emergency Medicine

## 2023-01-18 ENCOUNTER — Other Ambulatory Visit: Payer: Self-pay

## 2023-01-18 ENCOUNTER — Telehealth: Payer: Self-pay | Admitting: Emergency Medicine

## 2023-01-18 DIAGNOSIS — M810 Age-related osteoporosis without current pathological fracture: Secondary | ICD-10-CM | POA: Diagnosis not present

## 2023-01-18 MED ORDER — DENOSUMAB 60 MG/ML ~~LOC~~ SOSY
60.0000 mg | PREFILLED_SYRINGE | Freq: Once | SUBCUTANEOUS | Status: AC
  Administered 2023-07-23: 60 mg via SUBCUTANEOUS

## 2023-01-18 NOTE — Telephone Encounter (Signed)
Prolia was done today Next is due on/after 07/18/23.  CAM placed.

## 2023-01-18 NOTE — Progress Notes (Signed)
Specialty Pharmacy Refill Coordination Note  [name] is a 83 y.o. female contacted today regarding refills of specialty medication(s) Venetoclax [name])   Patient requested Delivery   Delivery date: 01/25/23   Verified address: [address]   [name] [address]   Medication will be filled on 01.22.25.   Used [employer].

## 2023-01-18 NOTE — Telephone Encounter (Signed)
Patient received Prolia injection today.

## 2023-01-18 NOTE — Progress Notes (Addendum)
Patient here for prolia injection per physicians orders.  Injection given left subq and patient tolerated well.  

## 2023-01-24 ENCOUNTER — Other Ambulatory Visit: Payer: Self-pay

## 2023-01-25 ENCOUNTER — Other Ambulatory Visit: Payer: Self-pay | Admitting: Internal Medicine

## 2023-01-30 ENCOUNTER — Encounter: Payer: Self-pay | Admitting: Endocrinology

## 2023-01-30 ENCOUNTER — Ambulatory Visit (INDEPENDENT_AMBULATORY_CARE_PROVIDER_SITE_OTHER): Payer: Medicare Other | Admitting: Endocrinology

## 2023-01-30 ENCOUNTER — Telehealth: Payer: Self-pay

## 2023-01-30 VITALS — BP 130/80 | HR 69 | Resp 20 | Ht 65.0 in | Wt 172.0 lb

## 2023-01-30 DIAGNOSIS — E1122 Type 2 diabetes mellitus with diabetic chronic kidney disease: Secondary | ICD-10-CM

## 2023-01-30 DIAGNOSIS — Z794 Long term (current) use of insulin: Secondary | ICD-10-CM | POA: Diagnosis not present

## 2023-01-30 DIAGNOSIS — M810 Age-related osteoporosis without current pathological fracture: Secondary | ICD-10-CM | POA: Diagnosis not present

## 2023-01-30 DIAGNOSIS — N1832 Chronic kidney disease, stage 3b: Secondary | ICD-10-CM

## 2023-01-30 MED ORDER — INSULIN REGULAR HUMAN 100 UNIT/ML IJ SOLN: 6.0000 [IU] | Freq: Two times a day (BID) | INTRAMUSCULAR | 3 refills | Status: DC

## 2023-01-30 NOTE — Telephone Encounter (Signed)
PA needed for Ozempic for year 2025

## 2023-01-30 NOTE — Progress Notes (Signed)
Outpatient Endocrinology Note [place] Devean Skoczylas, MD  01/30/23  Patient's Name: [name]    DOB: 09-Oct-1940    MRN: [id number]                                                    REASON OF VISIT: Follow up for type 2 diabetes mellitus /osteoporosis  PCP: Wanda Plump, MD  HISTORY OF PRESENT ILLNESS:   [name] is a 83 y.o. old female with past medical history listed below, is here for follow up of type 2 diabetes mellitus / osteoporosis.   Pertinent Diabetes History: Patient was diagnosed with type 2 diabetes mellitus in 1998.   Chronic Diabetes Complications : Retinopathy: yes following with ophthalmology.   Nephropathy: CKD 3B, on irbesartan Peripheral neuropathy: no Coronary artery disease: yes, CABG Stroke: no  Relevant comorbidities and cardiovascular risk factors: Obesity: no Body mass index is 28.62 kg/m.  Hypertension: yes Hyperlipidemia. Yes, on a statin  Current / Home Diabetic regimen includes: Novolin N 20 units in the morning and 10 units in the evening. Regular insulin 5 units with breakfast and 5 units with supper. Metformin 500 mg daily. Ozempic 0.5 mg weekly.  Prior diabetic medications: Victoza in the past, changed to Ozempic due to insurance preference in ~ May 2024. Insulin doses were decreased in October due to hypoglycemia.  Glycemic data:   CONTINUOUS GLUCOSE MONITORING SYSTEM (CGMS) INTERPRETATION: At today's visit, we reviewed CGM downloads. The full report is scanned in the media. Reviewing the CGM trends, blood glucose are as follows:  FreeStyle Libre 3 CGM-  Sensor Download (Sensor download was reviewed and summarized below.) Dates: January 15 to January 30, 2023, 14 days. Sensor Average: 166 Glucose Management Indicator: 7.3%  % data captured: 96%    Interpretation: Mostly acceptable blood sugar with occasional mild hyperglycemia especially with lunch and supper with blood sugar up to 280 range.  Blood sugar overnight  acceptable.  No more hypoglycemia.  Hypoglycemia has significantly improved.  Hypoglycemia: Patient has frequent hypoglycemic episodes. Patient has hypoglycemia ? awareness.  Factors modifying glucose control: 1.  Diabetic diet assessment: 2-3 meals a day, she sometimes does not eat dinner.  At suppertime she usually does like to snack.  2.  Staying active or exercising: No formal exercise.  3.  Medication compliance: compliant all of the time.  # Osteoporosis : -Osteoporosis was being managed by primary care provider and referred to endocrinology after her DEXA scan in July 18, 2022.  -DEXA scan in July 18, 2022 T-score AP spine L1-L4 -0.7  and Right hip total -2.7 consistent with osteoporosis. ( Left hip not used due to surgery)  DEX scan in 12/2015, T-Score: AP spine -0.8, RFN -2.5, LFN -2.9 consistent with osteoporosis. (GE lunar)  Patient had remote history of foot fracture due to walking.  She had a history of left hip fracture in August 2020.  Treatment history: In August 2020 started on Fosamax and later switched to Prolia due to CKD and difficulty swallowing, first dose of Prolia in December 2021, she has been Prolia every 6 months without missing and last dose was in January 2025.  She is currently taking vitamin D3 1000 international unit daily, multivitamin 1 tablet daily.  She is taking additional calcium.  She eats yogurt daily and fair amount of cheese.  CTX in August 2024 was 175 normal.  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  During the chemotherapy course she had received multiple course of dexamethasone, which is on 6 factor for continued bone loss.  Patient has remote history of colon cancer. DCIS left breast.   Interval history  CGM data as reviewed above.  She is normal longer having hypoglycemia after adjustment of insulin regimen in the last visit.  Diabetes regimen as noted above.  She was taking Ozempic until 1 week ago, not  able to refill and require prior authorization.  Sent for prior obstruction.  She had Prolia injection on January 16.  She has been taking calcium and vitamin D supplement.  No fall and fracture.  GMI on CGM 7.3%.  No other complaints today.  REVIEW OF SYSTEMS As per history of present illness.   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   Gastritis    GERD (gastroesophageal reflux disease)    Headache(784.0)    History of blood transfusion    "I"ve had 20 some; thru birth of children, loss of blood, last 2 were in ~ 1999 before my cancer surgery" (09/16/2015)   History of hiatal hernia    History of shingles    Hx of colonic polyps    Hyperlipidemia    Hypertension    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HCC) 2000   Myocardial infarction (HCC)    1997   Pneumonia    X 1   Postoperative hematoma involving circulatory system following cardiac catheterization 09/17/2015   S/P cardiac cath 09/16/15 09/17/2015   Type II diabetes mellitus (HCC)    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[facility])    as a child   Venous insufficiency     PAST SURGICAL HISTORY: Past Surgical History:  Procedure Laterality Date   BREAST BIOPSY Left 03/10/2022   MM LT BREAST BX W LOC DEV 1ST LESION IMAGE BX SPEC STEREO GUIDE 03/10/2022 [facility]   BREAST BIOPSY  05/01/2022   MM LT RADIOACTIVE SEED LOC MAMMO GUIDE 05/01/2022 [facility]   BREAST LUMPECTOMY WITH RADIOACTIVE SEED LOCALIZATION Left 05/02/2022    Procedure: LEFT BREAST LUMPECTOMY WITH RADIOACTIVE SEED LOCALIZATION;  Surgeon: Harriette Bouillon, MD;  Location: [facility] OR;  Service: General;  Laterality: Left;   CARDIAC CATHETERIZATION  1997; 2008; 09/16/2015   CARDIAC CATHETERIZATION N/A 09/16/2015   Procedure: Right/Left Heart Cath and Coronary/Graft Angiography;  Surgeon: Runell Gess, MD;  Location: [facility];  Service: Cardiovascular;  Laterality: N/A;   CESAREAN SECTION  1966   COLONOSCOPY     CORONARY ARTERY BYPASS GRAFT  1997   CABG X5   EYE SURGERY     2 laser surgeries on left eye with implant   EYE SURGERY Bilateral    cataracts   FEMUR IM NAIL Left 08/28/2018   Procedure: INTRAMEDULLARY (IM) NAIL FEMORAL;  Surgeon: Samson Frederic, MD;  Location: [facility];  Service: Orthopedics;  Laterality: Left;   FRACTURE SURGERY     HERNIA REPAIR     LAPAROSCOPIC ASSISTED VENTRAL HERNIA REPAIR  09/2008    with incarcerated colon;  Dr. 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 Samuella Cota   SHOULDER ARTHROSCOPY W/ ROTATOR CUFF REPAIR Right 1997   SMALL INTESTINE SURGERY     TONSILLECTOMY  1960s   TOTAL KNEE ARTHROPLASTY WITH HARDWARE REMOVAL Left 12/1994   (infex, hardware removed )- not replacement per patient   TUBAL LIGATION  1966    ALLERGIES: Allergies  Allergen Reactions   Codeine Other (See Comments)    REACTION: makes her nervous, orTylenol #3   Ibuprofen Other (See Comments)    REACTION: nervous   Meperidine Hcl Nausea And Vomiting   Naproxen Sodium Other (See Comments)    Or Advil  REACTION: nervous    Tramadol Other (See Comments)    Insomnia    Tylenol With Codeine #3 [Acetaminophen-Codeine] Nausea And Vomiting    FAMILY HISTORY:  Family History  Problem Relation Age of Onset   Heart disease Mother 7   Heart disease Father 25       MI   Hypertension Child    Heart attack Child    Diabetes Child    Heart disease Maternal Aunt        x 2, all deceased   Heart disease Maternal Uncle        x 4, all deceased   Emphysema Brother 43   Colon cancer Neg Hx    Breast cancer Neg Hx    Rectal cancer Neg Hx    Stomach cancer Neg Hx     SOCIAL HISTORY: Social History   Socioeconomic History   Marital status: Widowed    Spouse name: Not on file   Number of children: 2   Years of education: Not on file   Highest education level: Not on file  Occupational History   Occupation: retired-- [employer]     Employer: RETIRED  Tobacco Use   Smoking status: Never   Smokeless tobacco: Never  Vaping Use   Vaping status: Never Used  Substance and Sexual Activity   Alcohol use: No    Alcohol/week: 0.0 standard drinks of alcohol   Drug use: No   Sexual activity: Not Currently  Other Topics Concern   Not on file  Social History Narrative   Lives by herself, lost husband ~ 2004 , still drives    1 child in [place]   I child in [place]   Social Drivers of Health   Financial Resource Strain: Low Risk  (06/29/2022)   Overall Financial Resource Strain (CARDIA)    Difficulty of Paying Living Expenses: Not hard at all  Food Insecurity: No Food Insecurity (06/29/2022)   Hunger Vital Sign    Worried About Running Out of Food in the Last Year: Never true    Ran Out of Food in the Last Year: Never true  Transportation Needs: No Transportation Needs (06/29/2022)   PRAPARE - Administrator, Civil Service (Medical): No    Lack of Transportation (Non-Medical): No  Physical Activity: Inactive (06/29/2022)   Exercise Vital Sign    Days of Exercise per Week: 0 days    Minutes of Exercise  per Session: 0 min  Stress: No Stress Concern Present (06/29/2022)   [employer] of Occupational Health - Occupational Stress Questionnaire    Feeling of Stress : Only a little  Social Connections: Moderately Integrated (06/29/2022)   Social Connection and Isolation Panel [NHANES]    Frequency of Communication with Friends and Family: More than three times a week    Frequency of Social Gatherings with Friends and Family: More than three times a week    Attends Religious Services: More than 4 times per year    Active Member of [employer] or Organizations: Yes    Attends Banker Meetings: More than 4 times per year    Marital Status: Widowed    MEDICATIONS:  Current Outpatient Medications  Medication Sig Dispense Refill   acetaminophen (TYLENOL) 500 MG tablet Take 1 tablet (500 mg total) by mouth every 6 (six) hours as needed. 30 tablet 0   amLODipine (NORVASC) 5 MG tablet Take 1 tablet by mouth daily 90 tablet 1   aspirin 81 MG chewable tablet Chew 1 tablet (81 mg total) by mouth daily.     atenolol (TENORMIN) 100 MG tablet Take 1 tablet (100 mg total) by mouth daily. 90 tablet 1   atorvastatin (LIPITOR) 20 MG tablet TAKE 1 TABLET(20 MG) BY MOUTH DAILY 90 tablet 3   Azelastine HCl 137 MCG/SPRAY SOLN Place into the nose at bedtime as needed.     Calcium Carb-Cholecalciferol (CALCIUM 500+D PO) Take by mouth daily.     clopidogrel (PLAVIX) 75 MG tablet Take 1 tablet (75 mg total) by mouth daily. 90 tablet 1   Continuous Glucose Sensor (FREESTYLE LIBRE 3 PLUS SENSOR) MISC 1 each by Does not apply route continuous. Change every 15 days. 6 each 4   diazepam (VALIUM) 5 MG tablet Take 1 tablet (5 mg total) by mouth every 6 (six) hours as needed for anxiety. 20 tablet 0   fluticasone (FLONASE) 50 MCG/ACT nasal spray SHAKE LIQUID AND USE 2 SPRAYS IN EACH NOSTRIL DAILY 48 g 3   Glucagon (GVOKE HYPOPEN 1-PACK) 1 MG/0.2ML SOAJ Inject 1 mg into the skin as needed (low blood sugar with  impaired consciousness). 0.4 mL 2   glucose blood (ONETOUCH ULTRA) test strip USE AS DIRECTED THREE TIMES DAILY 100 strip 5   insulin NPH Human (NOVOLIN N) 100 UNIT/ML injection Inject 20 units into the skin every morning and inject 10 units into the skin at evening meal. 10 mL 3   Insulin Syringe-Needle U-100 (INSULIN SYRINGE .5CC/31GX5/16") 31G X 5/16" 0.5 ML MISC USE TO INJECT INSULIN 5 TIMES PER DAY 200 each 0   irbesartan (AVAPRO) 300 MG tablet TAKE 1/2 TABLET(150 MG) BY MOUTH DAILY 45 tablet 3   isosorbide mononitrate (IMDUR) 30 MG 24 hr tablet TAKE 1 TABLET BY MOUTH EVERY DAY 90 tablet 3   metFORMIN (GLUCOPHAGE) 500 MG tablet TAKE 1 TABLET(500 MG) BY MOUTH DAILY WITH SUPPER 90 tablet 1   mometasone (ELOCON) 0.1 % cream Apply 1 Application topically daily as needed (itchy ears). Use as directed     Multiple Vitamin (MULTIVITAMIN) capsule Take 1 capsule by mouth daily.     omeprazole (PRILOSEC) 20  MG capsule TAKE 1 CAPSULE(20 MG) BY MOUTH DAILY 90 capsule 3   ondansetron (ZOFRAN) 4 MG tablet Take 1 tablet (4 mg total) by mouth every 8 (eight) hours as needed for nausea or vomiting. 20 tablet 0   Semaglutide,0.25 or 0.5MG /DOS, (OZEMPIC, 0.25 OR 0.5 MG/DOSE,) 2 MG/3ML SOPN Inject 0.5 mg into the skin every Monday. 9 mL 0   venetoclax (VENCLEXTA) 100 MG tablet Take 1 tablet (100 mg total) by mouth daily. Tablets should be swallowed whole with a meal and a full glass of water. 28 tablet 3   insulin regular (NOVOLIN R) 100 units/mL injection Inject 0.06 mLs (6 Units total) into the skin 2 (two) times daily before a meal. 10 mL 3   Current Facility-Administered Medications  Medication Dose Route Frequency Provider Last Rate Last Admin   [START ON 06/18/2023] denosumab (PROLIA) injection 60 mg  60 mg Subcutaneous Once Wanda Plump, MD       Facility-Administered Medications Ordered in Other Visits  Medication Dose Route Frequency Provider Last Rate Last Admin   vancomycin (VANCOCIN) 1,000 mg in  sodium chloride 0.9 % 500 mL IVPB  1,000 mg Intravenous Once Cornett, Thomas, MD        PHYSICAL EXAM: Vitals:   01/30/23 1342  BP: 130/80  Pulse: 69  Resp: 20  SpO2: 99%  Weight: 172 lb (78 kg)  Height: 5\' 5"  (1.651 m)   Body mass index is 28.62 kg/m.  Wt Readings from Last 3 Encounters:  01/30/23 172 lb (78 kg)  01/08/23 168 lb 1.3 oz (76.2 kg)  11/29/22 168 lb 9.6 oz (76.5 kg)    General: Well developed, well nourished female in no apparent distress.  HEENT: AT/[place], no external lesions.  Eyes: Conjunctiva clear and no icterus. Neck: Neck supple  Lungs: Respirations not labored Neurologic: Alert, oriented, normal speech Extremities / Skin: Dry. No sores or rashes noted.   Psychiatric: Does not appear depressed or anxious  Diabetic Foot Exam - Simple   Simple Foot Form Visual Inspection See comments: Yes Sensation Testing See comments: Yes Pulse Check See comments: Yes Comments DP 2 + bilaterally. B/l bunions.  No ulcer.  Monofilament mildly decreased bilaterally.     LABS Reviewed Lab Results  Component Value Date   HGBA1C 5.4 11/29/2022   HGBA1C 5.0 08/31/2022   HGBA1C 5.2 04/26/2022   Lab Results  Component Value Date   FRUCTOSAMINE 205 05/11/2022   FRUCTOSAMINE 207 10/25/2021   FRUCTOSAMINE 230 06/28/2021   Lab Results  Component Value Date   CHOL 110 07/17/2022   HDL 37.20 (L) 07/17/2022   LDLCALC 41 05/05/2020   LDLDIRECT 38.0 07/17/2022   TRIG 262.0 (H) 07/17/2022   CHOLHDL 3 07/17/2022   Lab Results  Component Value Date   MICRALBCREAT 14.5 05/11/2022   MICRALBCREAT 12.7 12/09/2020   Lab Results  Component Value Date   CREATININE 1.38 (H) 01/08/2023   Lab Results  Component Value Date   GFR 40.00 (L) 08/31/2022    ASSESSMENT / PLAN  1. Type 2 diabetes mellitus with stage 3b chronic kidney disease, with long-term current use of insulin (HCC)   2. Age-related osteoporosis without current pathological fracture     Diabetes  Mellitus type 2, complicated by CKD/ CAD - Diabetic status / severity: Fair control.  Lab Results  Component Value Date   HGBA1C 5.4 11/29/2022    - Hemoglobin A1c goal : <7.5%  No longer having hypoglycemia after adjustment of insulin regimen in the  last visit.  Will keep permissive hyperglycemia means mild hyperglycemia is okay to avoid hypoglycemia.  GMI on CGM 7.3% acceptable.  She has mild occasional postprandial hyperglycemia.  Adjusted diabetes regimen as follows.  Diabetes regimen: Diabetes regimen: Continue Novolin N 20 units in the morning and 10 units in the evening with meals. Increase regular insulin from 5 to 6 units two times a day with meals. 3.   Continue ozempic 0.5 mg weekly (currently sent for prior authorization ) and metformin 500 mg daily.  Take glucose tablet take 2-4 tablets when glucose is low. It is over the counter.  - Home glucose testing: Continue CGM/freestyle libre 3+, sent new prescription.  Check blood sugar as needed.  - Discussed/ Gave Hypoglycemia treatment plan.  In detail about risks of hypoglycemia.  Patient has been getting glucose tablet.  Patient lives by herself not able to use Glucagon Emergency Kit, sent prescription for glucagon kit as well.  Discussed that hypoglycemia can be immediately and acutely dangerous and even life-threatening.  # Consult : not required at this time.   # Annual urine for microalbuminuria/ creatinine ratio, no microalbuminuria currently, continue ACE/ARB /irbesartan. Last  Lab Results  Component Value Date   MICRALBCREAT 14.5 05/11/2022    # Foot check nightly / neuropathy.  # Annual dilated diabetic eye exams.   2. Blood pressure  -  BP Readings from Last 1 Encounters:  01/30/23 130/80    - Control is in target.  - No change in current plans.  3. Lipid status / Hyperlipidemia - Last  Lab Results  Component Value Date   LDLCALC 41 05/05/2020   - Continue atorvastatin 20 mg daily.  Managed by  primary care provider.  # Osteoporosis -Patient had recent DEXA scan in July 2024 consistent with osteoporosis based on lowest T-score of -2.7 at right hip and T-score at lumbar spine -0.7.  She had DEXA scan in 2017 with lowest T-score of -2.9 at femoral neck and T-score of -0.8 at lumbar spine consistent with osteoporosis. -She was treated with Fosamax initially started in August 2020 and switched to Prolia.  First dose was in December 2021, she has received Prolia every 6 months and last dose was in July 2024. -She has no recent fracture. -Not able to compare bone density done in 2017 and bone density in July 2024 as they were done in different places.  However overall it seems to be stable, over the period of 7 years, despite she had received multiple doses of dexamethasone for the treatment of CLL in last 2 years which is a risk for bone loss.  Plan: -Continue Prolia 60 mg subcutaneous every 6 months.  Last injection in January 18, 2023. -Continue current dose of vitamin D and calcium. Take calcium 500 mg daily in addition to dietary calcium and multivitamin. -Discussed about fall precautions.  Diagnoses and all orders for this visit:  Type 2 diabetes mellitus with stage 3b chronic kidney disease, with long-term current use of insulin (HCC) -     insulin regular (NOVOLIN R) 100 units/mL injection; Inject 0.06 mLs (6 Units total) into the skin 2 (two) times daily before a meal.  Age-related osteoporosis without current pathological fracture  Other orders -     Discontinue: insulin regular (NOVOLIN R) 100 units/mL injection; Inject 0.06 mLs (6 Units total) into the skin 2 (two) times daily before a meal.     DISPOSITION Follow up in clinic in 3 months suggested.   All questions  answered and patient verbalized understanding of the plan.  [place] Lailie Smead, MD [facility] Endocrin[facility] [address] [place], [address] [place], [place] [address] Phone # [phone]  At  least part of this note was generated using voice recognition software. Inadvertent word errors may have occurred, which were not recognized during the proofreading process.

## 2023-01-30 NOTE — Patient Instructions (Signed)
Diabetes regimen: Novolin N to 20 units in the morning and 10 units in the evening with meals. Regular insulin 6 units two times a day with meals. 3.   Continue ozempic and metformin same.  Take glucose tablet take 2-4 tablets when glucose is low. It is over the counter.

## 2023-01-31 ENCOUNTER — Encounter: Payer: Self-pay | Admitting: Endocrinology

## 2023-02-09 ENCOUNTER — Other Ambulatory Visit: Payer: Self-pay

## 2023-02-14 ENCOUNTER — Telehealth: Payer: Self-pay

## 2023-02-14 ENCOUNTER — Other Ambulatory Visit (HOSPITAL_COMMUNITY): Payer: Self-pay

## 2023-02-14 NOTE — Telephone Encounter (Signed)
Pharmacy Patient Advocate Encounter   Received notification from [facility] request that prior authorization for Ozempic (0.25 or 0.5 MG/DOSE) is required/requested.   Insurance verification completed.   The patient is insured through [employer] .   Per test claim: Refill too soon. PA is not needed at this time. Medication was filled 02/06/2023. Next eligible fill date is 02/27/2023.

## 2023-02-16 ENCOUNTER — Other Ambulatory Visit (HOSPITAL_COMMUNITY): Payer: Self-pay

## 2023-02-16 ENCOUNTER — Other Ambulatory Visit: Payer: Self-pay | Admitting: Hematology & Oncology

## 2023-02-16 ENCOUNTER — Other Ambulatory Visit: Payer: Self-pay

## 2023-02-16 DIAGNOSIS — C9110 Chronic lymphocytic leukemia of B-cell type not having achieved remission: Secondary | ICD-10-CM

## 2023-02-16 MED ORDER — VENETOCLAX 100 MG PO TABS
100.0000 mg | ORAL_TABLET | Freq: Every day | ORAL | 3 refills | Status: DC
  Filled 2023-02-16: qty 28, 28d supply, fill #0
  Filled 2023-03-19 – 2023-03-20 (×2): qty 28, 28d supply, fill #1
  Filled 2023-04-12: qty 28, 28d supply, fill #2
  Filled 2023-05-15: qty 28, 28d supply, fill #3

## 2023-02-16 NOTE — Progress Notes (Signed)
Specialty Pharmacy Refill Coordination Note  [name] is a 83 y.o. female contacted today regarding refills of specialty medication(s) [name])   Patient requested Delivery   Delivery date: 02/23/23   Verified address: [address]   [name] [id number]   Medication will be filled on 02/22/23.    This fill date is pending response to refill request from provider. Patient is aware and if they have not received fill by intended date, they must follow up with pharmacy.

## 2023-02-17 ENCOUNTER — Other Ambulatory Visit: Payer: Self-pay | Admitting: Internal Medicine

## 2023-02-19 ENCOUNTER — Encounter: Payer: Self-pay | Admitting: Cardiovascular Disease

## 2023-02-19 ENCOUNTER — Ambulatory Visit: Payer: Medicare Other | Attending: Cardiovascular Disease | Admitting: Cardiovascular Disease

## 2023-02-19 VITALS — BP 140/78 | HR 63 | Ht 65.0 in | Wt 174.6 lb

## 2023-02-19 DIAGNOSIS — E785 Hyperlipidemia, unspecified: Secondary | ICD-10-CM | POA: Diagnosis not present

## 2023-02-19 DIAGNOSIS — I152 Hypertension secondary to endocrine disorders: Secondary | ICD-10-CM | POA: Diagnosis not present

## 2023-02-19 DIAGNOSIS — E1159 Type 2 diabetes mellitus with other circulatory complications: Secondary | ICD-10-CM | POA: Insufficient documentation

## 2023-02-19 DIAGNOSIS — E1169 Type 2 diabetes mellitus with other specified complication: Secondary | ICD-10-CM | POA: Insufficient documentation

## 2023-02-19 DIAGNOSIS — I2510 Atherosclerotic heart disease of native coronary artery without angina pectoris: Secondary | ICD-10-CM | POA: Insufficient documentation

## 2023-02-19 NOTE — Assessment & Plan Note (Signed)
History of CAD status post cardiac catheterization by myself in 1997 at which time I placed the balloon pump and she went to surgery.  Dr. Maren Beach placed 5 bypass grafts.  I last catheterized her 09/16/2015 revealing patent grafts with a nongrafted nondominant circumflex.  She denies chest pain or shortness of breath.  Last Myoview performed 05/12/2019 was nonischemic.

## 2023-02-19 NOTE — Patient Instructions (Signed)

## 2023-02-19 NOTE — Assessment & Plan Note (Signed)
History of essential hypertension with blood pressure measured today at 178/78 which is unusually high for her.  She is on amlodipine, atenolol and Avapro.
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
## 2023-02-19 NOTE — Assessment & Plan Note (Signed)
History of hyperlipidemia on statin therapy with lipid profile performed 07/17/2022 revealing total cholesterol 110, LDL 41 and HDL 47.

## 2023-03-13 ENCOUNTER — Other Ambulatory Visit: Payer: Self-pay

## 2023-03-15 ENCOUNTER — Other Ambulatory Visit: Payer: Self-pay

## 2023-03-19 ENCOUNTER — Other Ambulatory Visit: Payer: Self-pay

## 2023-03-20 ENCOUNTER — Other Ambulatory Visit: Payer: Self-pay

## 2023-03-20 ENCOUNTER — Other Ambulatory Visit: Payer: Self-pay | Admitting: Cardiovascular Disease

## 2023-03-20 ENCOUNTER — Inpatient Hospital Stay: Payer: Medicare Other | Attending: Hematology & Oncology

## 2023-03-20 ENCOUNTER — Encounter: Payer: Self-pay | Admitting: Hematology & Oncology

## 2023-03-20 ENCOUNTER — Other Ambulatory Visit (HOSPITAL_COMMUNITY): Payer: Self-pay

## 2023-03-20 ENCOUNTER — Inpatient Hospital Stay (HOSPITAL_BASED_OUTPATIENT_CLINIC_OR_DEPARTMENT_OTHER): Payer: Medicare Other | Admitting: Hematology & Oncology

## 2023-03-20 VITALS — BP 183/57 | HR 63 | Temp 97.7°F | Resp 19 | Ht 65.0 in | Wt 173.0 lb

## 2023-03-20 DIAGNOSIS — Z7902 Long term (current) use of antithrombotics/antiplatelets: Secondary | ICD-10-CM | POA: Diagnosis not present

## 2023-03-20 DIAGNOSIS — Z7969 Long term (current) use of other immunomodulators and immunosuppressants: Secondary | ICD-10-CM | POA: Diagnosis not present

## 2023-03-20 DIAGNOSIS — D0512 Intraductal carcinoma in situ of left breast: Secondary | ICD-10-CM

## 2023-03-20 DIAGNOSIS — Z7984 Long term (current) use of oral hypoglycemic drugs: Secondary | ICD-10-CM | POA: Diagnosis not present

## 2023-03-20 DIAGNOSIS — Z85038 Personal history of other malignant neoplasm of large intestine: Secondary | ICD-10-CM | POA: Insufficient documentation

## 2023-03-20 DIAGNOSIS — C9110 Chronic lymphocytic leukemia of B-cell type not having achieved remission: Secondary | ICD-10-CM | POA: Diagnosis not present

## 2023-03-20 DIAGNOSIS — D509 Iron deficiency anemia, unspecified: Secondary | ICD-10-CM | POA: Diagnosis not present

## 2023-03-20 DIAGNOSIS — Z7951 Long term (current) use of inhaled steroids: Secondary | ICD-10-CM | POA: Diagnosis not present

## 2023-03-20 LAB — CMP (CANCER CENTER ONLY)
ALT: 16 U/L (ref 0–44)
AST: 17 U/L (ref 15–41)
Albumin: 4.1 g/dL (ref 3.5–5.0)
Alkaline Phosphatase: 75 U/L (ref 38–126)
Anion gap: 8 (ref 5–15)
BUN: 22 mg/dL (ref 8–23)
CO2: 28 mmol/L (ref 22–32)
Calcium: 9.3 mg/dL (ref 8.9–10.3)
Chloride: 108 mmol/L (ref 98–111)
Creatinine: 1.3 mg/dL — ABNORMAL HIGH (ref 0.44–1.00)
GFR, Estimated: 41 mL/min — ABNORMAL LOW (ref >60)
Glucose, Bld: 121 mg/dL — ABNORMAL HIGH (ref 70–99)
Potassium: 3.8 mmol/L (ref 3.5–5.1)
Sodium: 144 mmol/L (ref 135–145)
Total Bilirubin: 0.4 mg/dL (ref 0.0–1.2)
Total Protein: 6.6 g/dL (ref 6.5–8.1)

## 2023-03-20 LAB — CBC WITH DIFFERENTIAL (CANCER CENTER ONLY)
Abs Immature Granulocytes: 0.02 10*3/uL (ref 0.00–0.07)
Basophils Absolute: 0 10*3/uL (ref 0.0–0.1)
Basophils Relative: 0 %
Eosinophils Absolute: 0.1 10*3/uL (ref 0.0–0.5)
Eosinophils Relative: 1 %
HCT: 32.7 % — ABNORMAL LOW (ref 36.0–46.0)
Hemoglobin: 11 g/dL — ABNORMAL LOW (ref 12.0–15.0)
Immature Granulocytes: 0 %
Lymphocytes Relative: 29 %
Lymphs Abs: 1.4 10*3/uL (ref 0.7–4.0)
MCH: 33.2 pg (ref 26.0–34.0)
MCHC: 33.6 g/dL (ref 30.0–36.0)
MCV: 98.8 fL (ref 80.0–100.0)
Monocytes Absolute: 0.7 10*3/uL (ref 0.1–1.0)
Monocytes Relative: 14 %
Neutro Abs: 2.7 10*3/uL (ref 1.7–7.7)
Neutrophils Relative %: 56 %
Platelet Count: 183 10*3/uL (ref 150–400)
RBC: 3.31 MIL/uL — ABNORMAL LOW (ref 3.87–5.11)
RDW: 13.1 % (ref 11.5–15.5)
WBC Count: 4.8 10*3/uL (ref 4.0–10.5)
nRBC: 0 % (ref 0.0–0.2)

## 2023-03-20 LAB — RETICULOCYTES
Immature Retic Fract: 14.5 % (ref 2.3–15.9)
RBC.: 3.29 MIL/uL — ABNORMAL LOW (ref 3.87–5.11)
Retic Count, Absolute: 75 10*3/uL (ref 19.0–186.0)
Retic Ct Pct: 2.3 % (ref 0.4–3.1)

## 2023-03-20 LAB — SAVE SMEAR(SSMR), FOR PROVIDER SLIDE REVIEW

## 2023-03-20 LAB — IRON AND IRON BINDING CAPACITY (CC-WL,HP ONLY)
Iron: 66 ug/dL (ref 28–170)
Saturation Ratios: 20 % (ref 10.4–31.8)
TIBC: 335 ug/dL (ref 250–450)
UIBC: 269 ug/dL (ref 148–442)

## 2023-03-20 LAB — LACTATE DEHYDROGENASE: LDH: 171 U/L (ref 98–192)

## 2023-03-20 LAB — FERRITIN: Ferritin: 84 ng/mL (ref 11–307)

## 2023-03-20 NOTE — Progress Notes (Signed)
 Specialty Pharmacy Refill Coordination Note  [name] is a 83 y.o. female contacted today regarding refills of specialty medication(s) Venetoclax [name])   Patient requested Delivery   Delivery date: 03/22/23   Verified address: [address]   [name] [phone]   Medication will be filled on 03/21/23.

## 2023-03-20 NOTE — Progress Notes (Signed)
 Hematology and Oncology Follow Up Visit  [name] [id number] 1940/11/29 83 y.o. 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  She is doing okay.  She has had room no complaints since we last saw her.  Her blood sugars have been doing fairly well.  She has had no issues with nausea or vomiting.  There has been no change in bowel or bladder habits..  She continues on the venetoclax.  I will keep her on the venetoclax for right now.  She has had no bleeding.  There is been no headache.  She does have a history of the DCIS of the left breast.  Will be about a year that she had a lumpectomy.  I think she is done well with this.  She is still recovering from her son's death.  I think he had cancer and passed on back in 08/01/2023 of last year.  I know this has been incredibly challenging for she and her family.  Her last iron studies that were done back in January showed a ferritin of 98 with an iron saturation of 29%.  Overall, I would say that her performance status is probably ECOG 1.  Medications:  Allergies as of 03/20/2023       Reactions   Codeine Other (See Comments)   REACTION: makes her nervous, orTylenol #3   Ibuprofen Other (See Comments)   REACTION: nervous   Meperidine Hcl Nausea And Vomiting   Naproxen Sodium Other (See Comments)   Or Advil REACTION: nervous   Tramadol Other (See Comments)   Insomnia    Tylenol With Codeine #3 [acetaminophen-codeine] Nausea And Vomiting        Medication List        Accurate as  of March 20, 2023 11:49 AM. If you have any questions, ask your nurse or doctor.          acetaminophen 500 MG tablet Commonly known as: TYLENOL Take 1 tablet (500 mg total) by mouth every 6 (six) hours as needed.   amLODipine 5 MG tablet Commonly known as: NORVASC Take 1 tablet by mouth daily   aspirin 81 MG chewable tablet Chew 1 tablet (81 mg total) by mouth daily.   atenolol 100 MG tablet Commonly known as: TENORMIN Take 1 tablet (100 mg total) by mouth daily.   atorvastatin 20 MG tablet Commonly known as: LIPITOR TAKE 1 TABLET(20 MG) BY MOUTH DAILY   Azelastine HCl 137 MCG/SPRAY Soln Place into the nose at bedtime as needed.   CALCIUM 500+D PO Take by mouth daily.   clopidogrel 75 MG tablet Commonly known as: PLAVIX Take 1 tablet (75 mg total) by mouth daily.   diazepam 5 MG tablet Commonly known as: VALIUM Take 1 tablet (5 mg total) by mouth every 6 (six) hours as needed for anxiety.   fluticasone 50 MCG/ACT nasal spray Commonly known as: FLONASE SHAKE LIQUID AND USE 2 SPRAYS IN EACH NOSTRIL DAILY   FreeStyle Libre 3 Plus Sensor Misc 1 each by Does not apply route continuous. Change  every 15 days.   Gvoke HypoPen 1-Pack 1 MG/0.2ML Soaj Generic drug: Glucagon Inject 1 mg into the skin as needed (low blood sugar with impaired consciousness).   insulin NPH Human 100 UNIT/ML injection Commonly known as: NOVOLIN N Inject 20 units into the skin every morning and inject 10 units into the skin at evening meal.   insulin regular 100 units/mL injection Commonly known as: NOVOLIN R Inject 0.06 mLs (6 Units total) into the skin 2 (two) times daily before a meal.   INSULIN SYRINGE .5CC/31GX5/16" 31G X 5/16" 0.5 ML Misc USE TO INJECT INSULIN 5 TIMES PER DAY   irbesartan 300 MG tablet Commonly known as: AVAPRO TAKE 1/2 TABLET(150 MG) BY MOUTH DAILY   isosorbide mononitrate 30 MG 24 hr tablet Commonly known as: IMDUR TAKE 1 TABLET BY MOUTH EVERY DAY    metFORMIN 500 MG tablet Commonly known as: GLUCOPHAGE TAKE 1 TABLET(500 MG) BY MOUTH DAILY WITH SUPPER   mometasone 0.1 % cream Commonly known as: ELOCON Apply 1 Application topically daily as needed (itchy ears). Use as directed   multivitamin capsule Take 1 capsule by mouth daily.   omeprazole 20 MG capsule Commonly known as: PRILOSEC TAKE 1 CAPSULE(20 MG) BY MOUTH DAILY   ondansetron 4 MG tablet Commonly known as: Zofran Take 1 tablet (4 mg total) by mouth every 8 (eight) hours as needed for nausea or vomiting.   OneTouch Ultra test strip Generic drug: glucose blood USE AS DIRECTED THREE TIMES DAILY   Ozempic (0.25 or 0.5 MG/DOSE) 2 MG/3ML Sopn Generic drug: Semaglutide(0.25 or 0.5MG /DOS) Inject 0.5 mg into the skin every Monday.   Venclexta 100 MG tablet Generic drug: venetoclax Take 1 tablet (100 mg total) by mouth daily. Tablets should be swallowed whole with a meal and a full glass of water.        Allergies:  Allergies  Allergen Reactions   Codeine Other (See Comments)    REACTION: makes her nervous, orTylenol #3   Ibuprofen Other (See Comments)    REACTION: nervous   Meperidine Hcl Nausea And Vomiting   Naproxen Sodium Other (See Comments)    Or Advil  REACTION: nervous   Tramadol Other (See Comments)    Insomnia    Tylenol With Codeine #3 [Acetaminophen-Codeine] Nausea And Vomiting    Past Medical History, Surgical history, Social history, and Family History were reviewed and updated.  Review of Systems: Review of Systems  Constitutional: Negative.   HENT: Negative.    Eyes: Negative.   Respiratory: Negative.    Cardiovascular: Negative.   Gastrointestinal: Negative.   Genitourinary: Negative.   Musculoskeletal: Negative.   Skin: Negative.   Neurological: Negative.   Endo/Heme/Allergies: Negative.   Psychiatric/Behavioral: Negative.       Physical Exam:  height is 5\' 5"  (1.651 m) and weight is 173 lb (78.5 kg). Her oral temperature  is 97.7 F (36.5 C). Her blood pressure is 183/57 (abnormal) and her pulse is 63. Her respiration is 19 and oxygen saturation is 99%.   Wt Readings from Last 3 Encounters:  03/20/23 173 lb (78.5 kg)  02/19/23 174 lb 9.6 oz (79.2 kg)  01/30/23 172 lb (78 kg)    Physical Exam Vitals reviewed.  Constitutional:      Comments: Her breast exam shows right breast no masses, edema or erythema.  There is no right axillary adenopathy.  Left breast shows the lumpectomy scar at the rim of the areola.  This is at about the 12 o'clock position.  This is healing.  There is a little bit of ecchymoses.  There is a little bit of tenderness.  There is no erythema or swelling.  There is no left axillary adenopathy.  HENT:     Head: Normocephalic and atraumatic.  Eyes:     Pupils: Pupils are equal, round, and reactive to light.  Cardiovascular:     Rate and Rhythm: Normal rate and regular rhythm.     Heart sounds: Normal heart sounds.  Pulmonary:     Effort: Pulmonary effort is normal.     Breath sounds: Normal breath sounds.  Abdominal:     General: Bowel sounds are normal.     Palpations: Abdomen is soft.  Musculoskeletal:        General: No tenderness or deformity. Normal range of motion.     Cervical back: Normal range of motion.  Lymphadenopathy:     Cervical: No cervical adenopathy.  Skin:    General: Skin is warm and dry.     Findings: No erythema or rash.  Neurological:     Mental Status: She is alert and oriented to person, place, and time.  Psychiatric:        Behavior: Behavior normal.        Thought Content: Thought content normal.        Judgment: Judgment normal.      Lab Results  Component Value Date   WBC 4.8 03/20/2023   HGB 11.0 (L) 03/20/2023   HCT 32.7 (L) 03/20/2023   MCV 98.8 03/20/2023   PLT 183 03/20/2023   Lab Results  Component Value Date   FERRITIN 98 01/08/2023   IRON 93 01/08/2023   TIBC 321 01/08/2023   UIBC 228 01/08/2023   IRONPCTSAT 29 01/08/2023    Lab Results  Component Value Date   RETICCTPCT 2.3 03/20/2023   RBC 3.29 (L) 03/20/2023   RBC 3.31 (L) 03/20/2023   Lab Results  Component Value Date   KPAFRELGTCHN 32.8 (H) 05/16/2021   LAMBDASER 26.4 (H) 05/16/2021   KAPLAMBRATIO 1.24 05/16/2021   Lab Results  Component Value Date   IGGSERUM 845 08/22/2022   IGA 185 08/22/2022   IGMSERUM 37 08/22/2022   Lab Results  Component Value Date   TOTALPROTELP 6.5 11/18/2020   ALBUMINELP 3.6 11/18/2020   A1GS 0.3 11/18/2020   A2GS 0.9 11/18/2020   BETS 1.0 11/18/2020   BETA2SER 6.2 02/25/2014   GAMS 0.7 11/18/2020   MSPIKE Not Observed 11/18/2020   SPEI * 02/25/2014     Chemistry      Component Value Date/Time   NA 144 03/20/2023 1003   NA 147 (H) 11/03/2016 1018   NA 143 08/30/2016 0954   K 3.8 03/20/2023 1003   K 3.9 11/03/2016 1018   K 4.5 08/30/2016 0954   CL 108 03/20/2023 1003   CL 110 (H) 11/03/2016 1018   CO2 28 03/20/2023 1003   CO2 27 11/03/2016 1018   CO2 24 08/30/2016 0954   BUN 22 03/20/2023 1003   BUN 25 (H) 11/03/2016 1018   BUN 20.5 08/30/2016 0954   CREATININE 1.30 (H) 03/20/2023 1003   CREATININE 1.6 (H) 11/03/2016 1018   CREATININE 1.4 (H) 08/30/2016 0954      Component Value Date/Time   CALCIUM 9.3 03/20/2023 1003   CALCIUM 10.1 11/03/2016 1018   CALCIUM 9.8 08/30/2016 0954   ALKPHOS 75 03/20/2023 1003   ALKPHOS 112 (H) 11/03/2016 1018   ALKPHOS 113 08/30/2016 0954   AST 17 03/20/2023  1003   AST 23 08/30/2016 0954   ALT 16 03/20/2023 1003   ALT 29 11/03/2016 1018   ALT 20 08/30/2016 0954   BILITOT 0.4 03/20/2023 1003   BILITOT 0.38 08/30/2016 0954      Impression and Plan: Ms. Detlefsen is a very pleasant 83 yo caucasian female with CLL.  So far, we have not had any negative prognostic markers.  She does have the 13q-chromosomal abnormality.  This is typically construed as a good prognostic marker.  She also has  DCIS of the left breast.  She has had multiple surgeries.  She had a  excision after her positive margin.  Thankfully, the reexcision did not show any malignancy.  Again, she did not wish to have any radiation therapy for this.  I can certainly understand this.  At this point, I will plan to get her back to see [place] in about 2 months or so.  At that time, we will check another flow cytometry on her to see what the percentage of CLL cells are.  Hopefully, we can then see about discontinuing the venetoclax.    Josph Macho, MD 3/18/202511:49 AM

## 2023-03-21 ENCOUNTER — Telehealth: Payer: Self-pay

## 2023-03-21 ENCOUNTER — Encounter: Payer: Self-pay | Admitting: Hematology & Oncology

## 2023-03-21 ENCOUNTER — Other Ambulatory Visit: Payer: Self-pay

## 2023-03-21 NOTE — Addendum Note (Signed)
 Addended by: Josph Macho on: 03/21/2023 02:38 PM   Modules accepted: Orders

## 2023-03-21 NOTE — Telephone Encounter (Signed)
 Called and informed patient of lab results, patient verbalized understanding and denies any questions or concerns at this time. Advised her that scheduling would contact her to schedule an iron infusion.

## 2023-03-21 NOTE — Telephone Encounter (Signed)
-----   Message from Josph Macho sent at 03/20/2023  7:45 PM EDT ----- Call - the iron is borderline low.  Please set her up with IV iron.  Cindee Lame

## 2023-03-22 ENCOUNTER — Telehealth: Payer: Self-pay | Admitting: Hematology & Oncology

## 2023-03-22 NOTE — Telephone Encounter (Signed)
 Per Inbasket left vm pt needs a dose of Monoferric

## 2023-03-27 ENCOUNTER — Inpatient Hospital Stay

## 2023-03-27 VITALS — BP 155/62 | HR 66 | Temp 98.0°F | Resp 18

## 2023-03-27 DIAGNOSIS — D509 Iron deficiency anemia, unspecified: Secondary | ICD-10-CM | POA: Diagnosis not present

## 2023-03-27 DIAGNOSIS — D0512 Intraductal carcinoma in situ of left breast: Secondary | ICD-10-CM | POA: Diagnosis not present

## 2023-03-27 DIAGNOSIS — Z7902 Long term (current) use of antithrombotics/antiplatelets: Secondary | ICD-10-CM | POA: Diagnosis not present

## 2023-03-27 DIAGNOSIS — Z7969 Long term (current) use of other immunomodulators and immunosuppressants: Secondary | ICD-10-CM | POA: Diagnosis not present

## 2023-03-27 DIAGNOSIS — Z85038 Personal history of other malignant neoplasm of large intestine: Secondary | ICD-10-CM | POA: Diagnosis not present

## 2023-03-27 DIAGNOSIS — C9110 Chronic lymphocytic leukemia of B-cell type not having achieved remission: Secondary | ICD-10-CM | POA: Diagnosis not present

## 2023-03-27 DIAGNOSIS — D508 Other iron deficiency anemias: Secondary | ICD-10-CM

## 2023-03-27 MED ORDER — CETIRIZINE HCL 10 MG PO TABS: 10.0000 mg | ORAL_TABLET | Freq: Once | ORAL | Status: DC

## 2023-03-27 MED ORDER — CETIRIZINE HCL 10 MG/ML IV SOLN
10.0000 mg | Freq: Once | INTRAVENOUS | Status: AC
  Administered 2023-03-27: 10 mg via INTRAVENOUS
  Filled 2023-03-27: qty 1

## 2023-03-27 MED ORDER — SODIUM CHLORIDE 0.9 % IV SOLN
INTRAVENOUS | Status: DC
Start: 2023-03-27 — End: 2023-03-27

## 2023-03-27 MED ORDER — SODIUM CHLORIDE 0.9 % IV SOLN
1000.0000 mg | Freq: Once | INTRAVENOUS | Status: AC
  Administered 2023-03-27: 1000 mg via INTRAVENOUS
  Filled 2023-03-27: qty 10

## 2023-03-27 NOTE — Patient Instructions (Signed)

## 2023-04-05 ENCOUNTER — Other Ambulatory Visit: Payer: Self-pay

## 2023-04-05 DIAGNOSIS — E1165 Type 2 diabetes mellitus with hyperglycemia: Secondary | ICD-10-CM

## 2023-04-05 MED ORDER — OZEMPIC (0.25 OR 0.5 MG/DOSE) 2 MG/3ML ~~LOC~~ SOPN: 0.5000 mg | PEN_INJECTOR | SUBCUTANEOUS | 0 refills | Status: DC

## 2023-04-06 ENCOUNTER — Telehealth: Payer: Self-pay

## 2023-04-06 NOTE — Telephone Encounter (Signed)
 Pt calling office to ask if N/V/D along with dizziness is normal after iron infusion.  Pt received Monoferric over one week prior to her symptoms starting. Pt received iron 03/27/2023 and stated her symptoms started 04/03/2023. Pt states it started with diarrhea and vomiting and now she has nausea along with extreme fatigue. Pt states yesterday she noticed that she was dizzy at times. Pt states she has not been able to eat much at all this week due to her symptoms. Pt denies any fever. This office recommended pt go to urgent care or ER to evaluate for dehydration and dizziness. Pt also encouraged to take an at home COVID/FLU test. Pt educated that her symptoms were highly unlikely related to her iron infusion as symptoms started one week post infusion. Pt verbalized understanding to be evaluated by an Urgent Care or ER. Pt had no further questions and very appreciative of help.

## 2023-04-07 ENCOUNTER — Other Ambulatory Visit: Payer: Self-pay

## 2023-04-07 ENCOUNTER — Emergency Department (HOSPITAL_COMMUNITY)

## 2023-04-07 ENCOUNTER — Inpatient Hospital Stay (HOSPITAL_COMMUNITY)
Admission: EM | Admit: 2023-04-07 | Discharge: 2023-04-09 | DRG: 372 | Disposition: A | Discharge status: Home / Self Care | Attending: Internal Medicine | Admitting: Internal Medicine

## 2023-04-07 ENCOUNTER — Ambulatory Visit
Admission: EM | Admit: 2023-04-07 | Discharge: 2023-04-07 | Disposition: A | Discharge status: Home / Self Care | Source: Home / Self Care

## 2023-04-07 DIAGNOSIS — D509 Iron deficiency anemia, unspecified: Secondary | ICD-10-CM | POA: Diagnosis present

## 2023-04-07 DIAGNOSIS — R0602 Shortness of breath: Secondary | ICD-10-CM | POA: Diagnosis present

## 2023-04-07 DIAGNOSIS — E11649 Type 2 diabetes mellitus with hypoglycemia without coma: Secondary | ICD-10-CM | POA: Diagnosis present

## 2023-04-07 DIAGNOSIS — A0472 Enterocolitis due to Clostridium difficile, not specified as recurrent: Secondary | ICD-10-CM | POA: Diagnosis not present

## 2023-04-07 DIAGNOSIS — Z1152 Encounter for screening for COVID-19: Secondary | ICD-10-CM

## 2023-04-07 DIAGNOSIS — C9110 Chronic lymphocytic leukemia of B-cell type not having achieved remission: Secondary | ICD-10-CM | POA: Diagnosis present

## 2023-04-07 DIAGNOSIS — Z7984 Long term (current) use of oral hypoglycemic drugs: Secondary | ICD-10-CM

## 2023-04-07 DIAGNOSIS — Z85828 Personal history of other malignant neoplasm of skin: Secondary | ICD-10-CM | POA: Diagnosis not present

## 2023-04-07 DIAGNOSIS — R0609 Other forms of dyspnea: Secondary | ICD-10-CM | POA: Diagnosis present

## 2023-04-07 DIAGNOSIS — Z7982 Long term (current) use of aspirin: Secondary | ICD-10-CM

## 2023-04-07 DIAGNOSIS — K219 Gastro-esophageal reflux disease without esophagitis: Secondary | ICD-10-CM | POA: Diagnosis not present

## 2023-04-07 DIAGNOSIS — Z7902 Long term (current) use of antithrombotics/antiplatelets: Secondary | ICD-10-CM | POA: Diagnosis not present

## 2023-04-07 DIAGNOSIS — R531 Weakness: Secondary | ICD-10-CM

## 2023-04-07 DIAGNOSIS — Z885 Allergy status to narcotic agent status: Secondary | ICD-10-CM

## 2023-04-07 DIAGNOSIS — I252 Old myocardial infarction: Secondary | ICD-10-CM | POA: Diagnosis not present

## 2023-04-07 DIAGNOSIS — R197 Diarrhea, unspecified: Secondary | ICD-10-CM | POA: Diagnosis not present

## 2023-04-07 DIAGNOSIS — E860 Dehydration: Secondary | ICD-10-CM | POA: Diagnosis present

## 2023-04-07 DIAGNOSIS — Z8249 Family history of ischemic heart disease and other diseases of the circulatory system: Secondary | ICD-10-CM | POA: Diagnosis not present

## 2023-04-07 DIAGNOSIS — Z794 Long term (current) use of insulin: Secondary | ICD-10-CM | POA: Diagnosis not present

## 2023-04-07 DIAGNOSIS — I2510 Atherosclerotic heart disease of native coronary artery without angina pectoris: Secondary | ICD-10-CM | POA: Diagnosis not present

## 2023-04-07 DIAGNOSIS — I4510 Unspecified right bundle-branch block: Secondary | ICD-10-CM | POA: Diagnosis present

## 2023-04-07 DIAGNOSIS — Z853 Personal history of malignant neoplasm of breast: Secondary | ICD-10-CM

## 2023-04-07 DIAGNOSIS — E861 Hypovolemia: Secondary | ICD-10-CM | POA: Diagnosis present

## 2023-04-07 DIAGNOSIS — R10824 Left lower quadrant rebound abdominal tenderness: Secondary | ICD-10-CM | POA: Diagnosis not present

## 2023-04-07 DIAGNOSIS — Z951 Presence of aortocoronary bypass graft: Secondary | ICD-10-CM | POA: Diagnosis not present

## 2023-04-07 DIAGNOSIS — Z79899 Other long term (current) drug therapy: Secondary | ICD-10-CM

## 2023-04-07 DIAGNOSIS — K529 Noninfective gastroenteritis and colitis, unspecified: Secondary | ICD-10-CM | POA: Diagnosis not present

## 2023-04-07 DIAGNOSIS — E872 Acidosis, unspecified: Secondary | ICD-10-CM | POA: Diagnosis present

## 2023-04-07 DIAGNOSIS — Z85038 Personal history of other malignant neoplasm of large intestine: Secondary | ICD-10-CM

## 2023-04-07 DIAGNOSIS — M810 Age-related osteoporosis without current pathological fracture: Secondary | ICD-10-CM | POA: Diagnosis present

## 2023-04-07 DIAGNOSIS — Z66 Do not resuscitate: Secondary | ICD-10-CM | POA: Diagnosis not present

## 2023-04-07 DIAGNOSIS — N179 Acute kidney failure, unspecified: Secondary | ICD-10-CM | POA: Diagnosis present

## 2023-04-07 DIAGNOSIS — R112 Nausea with vomiting, unspecified: Secondary | ICD-10-CM

## 2023-04-07 DIAGNOSIS — I10 Essential (primary) hypertension: Secondary | ICD-10-CM | POA: Diagnosis not present

## 2023-04-07 DIAGNOSIS — Z96659 Presence of unspecified artificial knee joint: Secondary | ICD-10-CM | POA: Diagnosis not present

## 2023-04-07 DIAGNOSIS — Z86000 Personal history of in-situ neoplasm of breast: Secondary | ICD-10-CM

## 2023-04-07 DIAGNOSIS — Z886 Allergy status to analgesic agent status: Secondary | ICD-10-CM

## 2023-04-07 LAB — RESP PANEL BY RT-PCR (RSV, FLU A&B, COVID)  RVPGX2
Influenza A by PCR: NEGATIVE
Influenza B by PCR: NEGATIVE
Resp Syncytial Virus by PCR: NEGATIVE
SARS Coronavirus 2 by RT PCR: NEGATIVE

## 2023-04-07 LAB — CBC WITH DIFFERENTIAL/PLATELET
Abs Immature Granulocytes: 0.01 K/uL (ref 0.00–0.07)
Basophils Absolute: 0 K/uL (ref 0.0–0.1)
Basophils Relative: 0 %
Eosinophils Absolute: 0 K/uL (ref 0.0–0.5)
Eosinophils Relative: 0 %
HCT: 33.9 % — ABNORMAL LOW (ref 36.0–46.0)
Hemoglobin: 11.1 g/dL — ABNORMAL LOW (ref 12.0–15.0)
Immature Granulocytes: 0 %
Lymphocytes Relative: 28 %
Lymphs Abs: 1.7 K/uL (ref 0.7–4.0)
MCH: 33.1 pg (ref 26.0–34.0)
MCHC: 32.7 g/dL (ref 30.0–36.0)
MCV: 101.2 fL — ABNORMAL HIGH (ref 80.0–100.0)
Monocytes Absolute: 0.8 K/uL (ref 0.1–1.0)
Monocytes Relative: 14 %
Neutro Abs: 3.4 K/uL (ref 1.7–7.7)
Neutrophils Relative %: 58 %
Platelets: 176 K/uL (ref 150–400)
RBC: 3.35 MIL/uL — ABNORMAL LOW (ref 3.87–5.11)
RDW: 13.6 % (ref 11.5–15.5)
WBC: 6 K/uL (ref 4.0–10.5)
nRBC: 0 % (ref 0.0–0.2)

## 2023-04-07 LAB — COMPREHENSIVE METABOLIC PANEL WITH GFR
ALT: 39 U/L (ref 0–44)
AST: 33 U/L (ref 15–41)
Albumin: 3.5 g/dL (ref 3.5–5.0)
Alkaline Phosphatase: 49 U/L (ref 38–126)
Anion gap: 8 (ref 5–15)
BUN: 46 mg/dL — ABNORMAL HIGH (ref 8–23)
CO2: 20 mmol/L — ABNORMAL LOW (ref 22–32)
Calcium: 7.3 mg/dL — ABNORMAL LOW (ref 8.9–10.3)
Chloride: 109 mmol/L (ref 98–111)
Creatinine, Ser: 2.01 mg/dL — ABNORMAL HIGH (ref 0.44–1.00)
GFR, Estimated: 24 mL/min — ABNORMAL LOW
Glucose, Bld: 103 mg/dL — ABNORMAL HIGH (ref 70–99)
Potassium: 4.2 mmol/L (ref 3.5–5.1)
Sodium: 137 mmol/L (ref 135–145)
Total Bilirubin: 0.7 mg/dL (ref 0.0–1.2)
Total Protein: 7 g/dL (ref 6.5–8.1)

## 2023-04-07 LAB — POCT URINALYSIS DIP (MANUAL ENTRY)
Bilirubin, UA: NEGATIVE
Glucose, UA: NEGATIVE mg/dL
Ketones, POC UA: NEGATIVE mg/dL
Nitrite, UA: NEGATIVE
Protein Ur, POC: 30 mg/dL — AB
Spec Grav, UA: 1.015 (ref 1.010–1.025)
Urobilinogen, UA: 0.2 U/dL
pH, UA: 5.5 (ref 5.0–8.0)

## 2023-04-07 LAB — URINALYSIS, ROUTINE W REFLEX MICROSCOPIC
Bacteria, UA: NONE SEEN
Bilirubin Urine: NEGATIVE
Glucose, UA: NEGATIVE mg/dL
Ketones, ur: NEGATIVE mg/dL
Nitrite: NEGATIVE
Protein, ur: NEGATIVE mg/dL
Specific Gravity, Urine: 1.008 (ref 1.005–1.030)
pH: 5 (ref 5.0–8.0)

## 2023-04-07 LAB — POC COVID19/FLU A&B COMBO
Covid Antigen, POC: NEGATIVE
Influenza A Antigen, POC: NEGATIVE
Influenza B Antigen, POC: NEGATIVE

## 2023-04-07 LAB — LIPASE, BLOOD: Lipase: 64 U/L — ABNORMAL HIGH (ref 11–51)

## 2023-04-07 LAB — POCT FASTING CBG KUC MANUAL ENTRY: POCT Glucose (KUC): 149 mg/dL — AB (ref 70–99)

## 2023-04-07 MED ORDER — ONDANSETRON HCL 4 MG PO TABS: 4.0000 mg | ORAL_TABLET | Freq: Four times a day (QID) | ORAL | Status: DC | PRN

## 2023-04-07 MED ORDER — LACTATED RINGERS IV SOLN: INTRAVENOUS | Status: DC

## 2023-04-07 MED ORDER — SODIUM CHLORIDE 0.9 % IV BOLUS
1000.0000 mL | Freq: Once | INTRAVENOUS | Status: AC
  Administered 2023-04-07: 1000 mL via INTRAVENOUS

## 2023-04-07 MED ORDER — INSULIN ASPART 100 UNIT/ML IJ SOLN
0.0000 [IU] | Freq: Three times a day (TID) | INTRAMUSCULAR | Status: DC
  Administered 2023-04-08 (×3): 1 [IU] via SUBCUTANEOUS
  Filled 2023-04-07: qty 0.06

## 2023-04-07 MED ORDER — PANTOPRAZOLE SODIUM 40 MG PO TBEC
40.0000 mg | DELAYED_RELEASE_TABLET | Freq: Every day | ORAL | Status: DC
  Administered 2023-04-08 – 2023-04-09 (×2): 40 mg via ORAL
  Filled 2023-04-07 (×2): qty 1

## 2023-04-07 MED ORDER — ATORVASTATIN CALCIUM 10 MG PO TABS
20.0000 mg | ORAL_TABLET | Freq: Every day | ORAL | Status: DC
Start: 2023-04-08 — End: 2023-04-09
  Administered 2023-04-08 – 2023-04-09 (×2): 20 mg via ORAL
  Filled 2023-04-07 (×2): qty 2

## 2023-04-07 MED ORDER — ACETAMINOPHEN 500 MG PO TABS: 500.0000 mg | ORAL_TABLET | Freq: Four times a day (QID) | ORAL | Status: DC | PRN

## 2023-04-07 MED ORDER — VENETOCLAX 100 MG PO TABS: 100.0000 mg | ORAL_TABLET | Freq: Every day | ORAL | Status: DC

## 2023-04-07 MED ORDER — CLOPIDOGREL BISULFATE 75 MG PO TABS
75.0000 mg | ORAL_TABLET | Freq: Every day | ORAL | Status: DC
Start: 2023-04-07 — End: 2023-04-09
  Administered 2023-04-07: 75 mg via ORAL
  Filled 2023-04-07 (×3): qty 1

## 2023-04-07 MED ORDER — AMLODIPINE BESYLATE 5 MG PO TABS: 5.0000 mg | ORAL_TABLET | Freq: Every day | ORAL | Status: DC

## 2023-04-07 MED ORDER — ONDANSETRON HCL 4 MG/2ML IJ SOLN: 4.0000 mg | Freq: Four times a day (QID) | INTRAMUSCULAR | Status: DC | PRN

## 2023-04-07 MED ORDER — DIAZEPAM 5 MG PO TABS
5.0000 mg | ORAL_TABLET | Freq: Four times a day (QID) | ORAL | Status: DC | PRN
Start: 2023-04-07 — End: 2023-04-09

## 2023-04-07 MED ORDER — FLUTICASONE PROPIONATE 50 MCG/ACT NA SUSP
2.0000 | Freq: Every day | NASAL | Status: DC
  Administered 2023-04-08 – 2023-04-09 (×2): 2 via NASAL
  Filled 2023-04-07: qty 16

## 2023-04-07 MED ORDER — ISOSORBIDE MONONITRATE ER 30 MG PO TB24
30.0000 mg | ORAL_TABLET | Freq: Every day | ORAL | Status: DC
  Administered 2023-04-08 – 2023-04-09 (×2): 30 mg via ORAL
  Filled 2023-04-07 (×2): qty 1

## 2023-04-07 MED ORDER — ATENOLOL 25 MG PO TABS: 100.0000 mg | ORAL_TABLET | Freq: Every day | ORAL | Status: DC

## 2023-04-07 MED ORDER — ONDANSETRON HCL 4 MG/2ML IJ SOLN
4.0000 mg | Freq: Once | INTRAMUSCULAR | Status: AC
  Administered 2023-04-07: 4 mg via INTRAVENOUS
  Filled 2023-04-07: qty 2

## 2023-04-07 MED ORDER — HYDRALAZINE HCL 20 MG/ML IJ SOLN
5.0000 mg | Freq: Four times a day (QID) | INTRAMUSCULAR | Status: DC | PRN
  Administered 2023-04-09: 5 mg via INTRAVENOUS
  Filled 2023-04-07: qty 1

## 2023-04-07 NOTE — ED Notes (Signed)
 Patient is being discharged from the Urgent Care and sent to the Emergency Department via POV with daughter-in-law. Per Ruben Reason, [place], patient is in need of higher level of care due to SOB and weakness. Patient is aware and verbalizes understanding of plan of care.  Vitals:   04/07/23 1206  Resp: 16  Temp: (!) 97.5 F (36.4 C)  SpO2: 97%

## 2023-04-07 NOTE — ED Triage Notes (Signed)
 Pt arrived via POV from UC. C/o N/V/D and SOB for 1x week.   NAD, AOx4

## 2023-04-07 NOTE — ED Provider Notes (Signed)
 [name] is a 83 y.o. female.  Pt is a 83 yo female with pmhx significant for htn, cad, hld, gerd, depression, dm2, iron def anemia, CLL, cad, remote hx colon cancer, and DCIS left breast s/p lumpectomy.  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  She did receive an iron transfusion on 3/25.  Pt said she's had sob, and n/v/d for the past week.  She has not had fevers.  She went to UC and was sent here for further eval.           Home Medications Prior to Admission medications   Medication Sig Start Date End Date Taking? Authorizing Provider  acetaminophen (TYLENOL) 500 MG tablet Take 1 tablet (500 mg total) by mouth every 6 (six) hours as needed. 12/25/21   Redwine, Madison A, PA-C  amLODipine (NORVASC) 5 MG tablet Take 1 tablet by mouth daily 11/28/22   Thapa, [place], MD  aspirin 81 MG chewable tablet Chew 1 tablet (81 mg total) by mouth daily. 06/22/21   Wanda Plump, MD  atenolol (TENORMIN) 100 MG tablet Take 1 tablet (100 mg total) by mouth daily. 02/19/23   Wanda Plump, MD  atorvastatin (LIPITOR) 20 MG tablet TAKE 1 TABLET(20 MG) BY MOUTH DAILY 03/22/23   Runell Gess, MD  Azelastine HCl 137 MCG/SPRAY SOLN Place into the nose at bedtime as needed.    [provider]  Calcium Carb-Cholecalciferol (CALCIUM 500+D PO) Take by mouth daily.    [provider]  clopidogrel (PLAVIX) 75 MG tablet Take 1 tablet (75 mg total) by mouth daily. 01/25/23   Wanda Plump, MD  Continuous Glucose Sensor (FREESTYLE LIBRE 3 PLUS SENSOR) MISC 1 each by Does not apply route continuous. Change every 15 days. 11/29/22   Thapa, [place], MD  diazepam (VALIUM) 5 MG tablet Take 1 tablet (5 mg total) by mouth every 6 (six) hours as needed for  anxiety. 07/20/22   Josph Macho, MD  fluticasone (FLONASE) 50 MCG/ACT nasal spray SHAKE LIQUID AND USE 2 SPRAYS IN EACH NOSTRIL DAILY 09/19/17   Wanda Plump, MD  Glucagon (GVOKE HYPOPEN 1-PACK) 1 MG/0.2ML SOAJ Inject 1 mg into the skin as needed (low blood sugar with impaired consciousness). 10/17/22   Thapa, [place], MD  glucose blood (ONETOUCH ULTRA) test strip USE AS DIRECTED THREE TIMES DAILY 04/26/22   Reather Littler, MD  insulin NPH Human (NOVOLIN N) 100 UNIT/ML injection Inject 20 units into the skin every morning and inject 10 units into the skin at evening meal. 11/29/22   Thapa, [place], MD  insulin regular (NOVOLIN R) 100 units/mL injection Inject 0.06 mLs (6 Units total) into the skin 2 (two) times daily before a meal. 01/30/23   Thapa, [place], MD  Insulin Syringe-Needle U-100 (INSULIN SYRINGE .5CC/31GX5/16") 31G X 5/16" 0.5 ML MISC USE TO INJECT INSULIN 5 TIMES PER DAY 08/28/19   Reather Littler, MD  irbesartan (AVAPRO) 300 MG tablet TAKE 1/2 TABLET(150 MG) BY MOUTH DAILY 04/28/22   Runell Gess, MD  isosorbide mononitrate (IMDUR) 30 MG 24 hr tablet TAKE 1 TABLET BY MOUTH EVERY DAY 04/28/22   Runell Gess, MD  metFORMIN (GLUCOPHAGE) 500 MG tablet TAKE  1 TABLET(500 MG) BY MOUTH DAILY WITH SUPPER 12/04/22   Thapa, [place], MD  mometasone (ELOCON) 0.1 % cream Apply 1 Application topically daily as needed (itchy ears). Use as directed 04/18/16   [provider]  Multiple Vitamin (MULTIVITAMIN) capsule Take 1 capsule by mouth daily.    [provider]  omeprazole (PRILOSEC) 20 MG capsule TAKE 1 CAPSULE(20 MG) BY MOUTH DAILY 12/04/22   Josph Macho, MD  ondansetron (ZOFRAN) 4 MG tablet Take 1 tablet (4 mg total) by mouth every 8 (eight) hours as needed for nausea or vomiting. 05/02/22   Cornett, Maisie Fus, MD  Semaglutide,0.25 or 0.5MG /DOS, (OZEMPIC, 0.25 OR 0.5 MG/DOSE,) 2 MG/3ML SOPN Inject 0.5 mg into the skin every Monday. 04/09/23   Thapa, [place], MD  venetoclax (VENCLEXTA) 100 MG  tablet Take 1 tablet (100 mg total) by mouth daily. Tablets should be swallowed whole with a meal and a full glass of water. 02/16/23   Josph Macho, MD      Allergies    Codeine, Ibuprofen, Meperidine hcl, Naproxen sodium, Tramadol, and Tylenol with codeine #3 [acetaminophen-codeine]    Review of Systems   Review of Systems  Respiratory:  Positive for shortness of breath.   Gastrointestinal:  Positive for diarrhea, nausea and vomiting.  All other systems reviewed and are negative.   Physical Exam Updated Vital Signs BP 131/67   Pulse 67   Temp 97.6 F (36.4 C) (Oral)   Resp 14   Ht 5\' 5"  (1.651 m)   Wt 78 kg   SpO2 100%   BMI 28.62 kg/m  Physical Exam Vitals and nursing note reviewed.  Constitutional:      Appearance: She is well-developed. She is obese.  HENT:     Head: Normocephalic and atraumatic.     Mouth/Throat:     Mouth: Mucous membranes are dry.     Pharynx: Oropharynx is clear.  Eyes:     Extraocular Movements: Extraocular movements intact.     Pupils: Pupils are equal, round, and reactive to light.  Cardiovascular:     Rate and Rhythm: Normal rate and regular rhythm.  Pulmonary:     Effort: Pulmonary effort is normal.     Breath sounds: Normal breath sounds.  Abdominal:     General: Bowel sounds are normal.     Palpations: Abdomen is soft.  Musculoskeletal:        General: Normal range of motion.     Cervical back: Normal range of motion and neck supple.  Skin:    General: Skin is warm.     Capillary Refill: Capillary refill takes less than 2 seconds.  Neurological:     General: No focal deficit present.     Mental Status: She is alert and oriented to person, place, and time.  Psychiatric:        Mood and Affect: Mood normal.        Behavior: Behavior normal.     ED Results / Procedures / Treatments   Labs (all labs ordered are listed, but only abnormal results are displayed) Labs Reviewed  GASTROINTESTINAL PANEL BY PCR, STOOL (REPLACES  STOOL CULTURE)  C DIFFICILE QUICK SCREEN W PCR REFLEX    RESP PANEL BY RT-PCR (RSV, FLU A&B, COVID)  RVPGX2  CBC WITH DIFFERENTIAL/PLATELET  COMPREHENSIVE METABOLIC PANEL WITH GFR  LIPASE, BLOOD  URINALYSIS, ROUTINE W REFLEX MICROSCOPIC    EKG EKG Interpretation Date/Time:  Saturday April 07 2023 13:27:17 EDT Ventricular Rate:  62 PR Interval:  223 QRS Duration:  145 QT Interval:  488 QTC Calculation: 496 R Axis:   8  Text Interpretation: Sinus rhythm Prolonged PR interval Right bundle branch block No significant change since last tracing Confirmed by Jacalyn Lefevre [phone]) on 04/07/2023 2:22:23 PM  Radiology DG Chest Portable 1 View Result Date: 04/07/2023 CLINICAL DATA:  Shortness of breath. EXAM: PORTABLE CHEST 1 VIEW COMPARISON:  Chest radiograph dated 05/02/2019. FINDINGS: No focal consolidation, pleural effusion or pneumothorax. The cardiac silhouette is within normal limits. Median sternotomy wires and CABG vascular clips. No acute osseous pathology. IMPRESSION: No active disease. Electronically Signed   By: Elgie Collard M.D.   On: 04/07/2023 14:27    Procedures Procedures    Medications Ordered in ED Medications  sodium chloride 0.9 % bolus 1,000 mL (1,000 mLs Intravenous New Bag/Given 04/07/23 1506)  ondansetron (ZOFRAN) injection 4 mg (4 mg Intravenous Given 04/07/23 1456)    ED Course/ Medical Decision Making/ A&P                                 Medical Decision Making Amount and/or Complexity of Data Reviewed Labs: ordered. Radiology: ordered.  Risk Prescription drug management.   This patient presents to the ED for concern of n/v/d and sob, this involves an extensive number of treatment options, and is a complaint that carries with it a high risk of complications and morbidity.  The differential diagnosis includes covid/flu/rsv, electrolyte abn, anemia, infection   Co morbidities that complicate the patient evaluation  htn, cad, hld, gerd, depression,  dm2, iron def anemia, CLL, cad, remote hx colon cancer, and DCIS left breast s/p lumpectomy   Additional history obtained:  Additional history obtained from epic chart review External records from outside source obtained and reviewed including family   Lab Tests:  I Ordered, and personally interpreted labs.  The pertinent results include:  labs are pending at shift change   Imaging Studies ordered:  I ordered imaging studies including cxr  I independently visualized and interpreted imaging which showed No active disease.  I agree with the radiologist interpretation   Cardiac Monitoring:  The patient was maintained on a cardiac monitor.  I personally viewed and interpreted the cardiac monitored which showed an underlying rhythm of: nsr   Medicines ordered and prescription drug management:  I ordered medication including ivfs/zofran  for sx  Reevaluation of the patient after these medicines showed that the patient improved I have reviewed the patients home medicines and have made adjustments as needed   Problem List / ED Course:  N/v/d:  ivf ordered.  Labs pending.  Stool studies ordered. SOB: cxr neg.  Covid/flu/rsv pending.  O2 sat 100%   Reevaluation:  After the interventions noted above, I reevaluated the patient and found that they have :improved   Social Determinants of Health:  Lives at home   Dispostion: Pending at shift change        Final Clinical Impression(s) / ED Diagnoses Final diagnoses:  Nausea vomiting and diarrhea  Dehydration  Shortness of breath    Rx / DC Orders ED Discharge Orders     None         Jacalyn Lefevre, MD 04/07/23 1525

## 2023-04-07 NOTE — Discharge Instructions (Addendum)
 Go to the emergency department for further evaluation and treatment.

## 2023-04-07 NOTE — ED Provider Notes (Signed)
  Physical Exam  BP 131/67   Pulse 67   Temp 97.6 F (36.4 C) (Oral)   Resp 14   Ht 5\' 5"  (1.651 m)   Wt 78 kg   SpO2 100%   BMI 28.62 kg/m   Physical Exam  Procedures  Procedures  ED Course / MDM    Medical Decision Making Amount and/or Complexity of Data Reviewed Labs: ordered. Radiology: ordered.  Risk Prescription drug management. Decision regarding hospitalization.   74F presenting with n/v/d getting labs and fluids, likely PO challenge and DC.   Labs reveal and AKI on CKD with a Cr of 2.01 form a recent baseline of 1.3-1.4.   Patient feeling symptomatically improved following rehydration.  Repeat blood glucose was 61 on her glucometer.  She was provided with snacks.  Discussed with her admission for observation for rehydration in the setting of her AKI and gastroenteritis symptoms.  The patient was amenable to admission overnight for observation and recheck of her renal function in the morning.  UA unconvincing for UTI, C. difficile and GI Pathogen panel; ordered but pending as the patient had not been able to provide a stool sample.  Hospitalist medicine consulted for admission, Dr. Donzetta Matters accepting.    Ernie Avena, MD 04/07/23 [phone]

## 2023-04-07 NOTE — Discharge Instructions (Addendum)
 Stop taking Avapro ( irbesartan)  until you follow up with PCP, make sure kidney function is at baseline.    You have new antibiotic to treat C diff. For 10 days.   Your insulin regime was resume lower dose, if your blood sugar is less than 150 do not take  the insulin.

## 2023-04-07 NOTE — Progress Notes (Signed)
 Prior-To-Admission Oral Chemotherapy for Treatment of Oncologic Disease   Order noted from Dr. Sunnie Nielsen to continue prior-to-admission oral chemotherapy regimen of venetoclax.  Procedure Per Pharmacy & Therapeutics Committee Policy: Orders for continuation of home oral chemotherapy for treatment of an oncologic disease will be held unless approved by an oncologist during current admission.    For patients receiving oncology care at [facility], inpatient pharmacist contacts patient's oncologist during regular office hours to review. If earlier review is medically necessary, attending physician consults [facility] on-call oncologist   For patients receiving oncology care outside of [facility], attending physician consults patient's oncologist to review. If this oncologist or their coverage cannot be reached, attending physician consults [facility] on-call oncologist   Oral chemotherapy continuation order is on hold pending oncologist review, [facility] oncologist Dr. Myna Hidalgo will be notified by inpatient pharmacy during office hours    Pricilla Riffle, PharmD, BCPS Clinical Pharmacist 04/07/2023 8:49 PM

## 2023-04-07 NOTE — ED Provider Notes (Signed)
 [name]    CSN: [id number] Arrival date & time: 04/07/23  1145      History   Chief Complaint Chief Complaint  Patient presents with   Weakness    HPI [name] is a 83 y.o. female.    Weakness Associated symptoms: diarrhea, dizziness, nausea, shortness of breath and vomiting   Associated symptoms: no abdominal pain, no cough, no dysuria, no fever and no headaches   Generalized weakness, states had vomiting that lasted 2 days onset 5 days ago, has had diarrhea for 4 days very frequent without blood or mucus.  Admits decreased appetite, admits generalized weakness and shortness of breath worse with activity. Had an iron infusion 10 days ago.  Has had iron infusions in the past denies history of adverse reaction to infusions. Denies fever, chills, chest pain, palpitations, abdominal pain or urinary symptoms.  Denies known contacts with illness.  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   Gastritis    GERD (gastroesophageal reflux disease)    Headache(784.0)    History of blood transfusion    "I"ve had 20 some; thru birth of children, loss of blood, last 2 were in ~ 1999 before my cancer surgery" (09/16/2015)   History of hiatal hernia    History of shingles    Hx of colonic polyps    Hyperlipidemia    Hypertension    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HCC) 2000   Myocardial infarction (HCC)    1997   Pneumonia    X 1   Postoperative hematoma involving circulatory system following  cardiac catheterization 09/17/2015   S/P cardiac cath 09/16/15 09/17/2015   Type II diabetes mellitus (HCC)    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   Venous insufficiency     Patient Active Problem List   Diagnosis Date Noted   Breast neoplasm, Tis (DCIS), 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   Fracture of unspecified metatarsal bone(s), right foot, initial encounter for closed fracture 12/30/2021   Osteoporosis 09/14/2019   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   Age-related osteoporosis with current pathological fracture 10/01/2018   Intertrochanteric fracture of left femur, closed, initial encounter (HCC) 08/27/2018   CKD (chronic kidney disease) stage 3, GFR 30-59 ml/min (HCC) 08/27/2018   CAD (coronary artery disease)    Iron deficiency anemia 08/30/2016   Shortness of breath 04/19/2016   Positive cardiac stress test    PCP NOTES >>>>>>>>>>>>>>>>>>> 12/02/2014   Annual physical exam  08/30/2014   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   Chronic kidney disease, stage III (moderate) (HCC) 09/18/2013   Anxiety and depression 10/03/2010   IBS (irritable bowel syndrome) 09/01/2010   Vitamin B deficiency 05/20/2009   HERNIA, VENTRAL 08/27/2008   GASTRITIS, HX OF 08/27/2008   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HCC) 12/20/2006   COLONIC POLYPS 12/20/2006   VENOUS INSUFFICIENCY  12/20/2006   Uncontrolled diabetes mellitus with chronic kidney disease 10/16/2006   Hyperlipidemia associated with type 2 diabetes mellitus (HCC) 10/16/2006   Hypertension associated with diabetes (HCC) 10/16/2006   Hx of CABG 10/16/2006   GERD 10/16/2006   DJD , hydrocodone, UDS 10/16/2006   SHINGLES, HX OF 10/16/2006    Past Surgical History:  Procedure Laterality Date   BREAST BIOPSY Left 03/10/2022   MM LT BREAST BX W LOC DEV 1ST LESION IMAGE BX SPEC STEREO GUIDE 03/10/2022 [facility]   BREAST BIOPSY  05/01/2022   MM LT RADIOACTIVE  SEED LOC MAMMO GUIDE 05/01/2022 [facility]   BREAST LUMPECTOMY WITH RADIOACTIVE SEED LOCALIZATION Left 05/02/2022   Procedure: LEFT BREAST LUMPECTOMY WITH RADIOACTIVE SEED LOCALIZATION;  Surgeon: Harriette Bouillon, MD;  Location: [facility] OR;  Service: General;  Laterality: Left;   CARDIAC CATHETERIZATION  1997; 2008; 09/16/2015   CARDIAC CATHETERIZATION N/A 09/16/2015   Procedure: Right/Left Heart Cath and Coronary/Graft Angiography;  Surgeon: Runell Gess, MD;  Location: [facility];  Service: Cardiovascular;  Laterality: N/A;   CESAREAN SECTION  1966   COLONOSCOPY     CORONARY ARTERY BYPASS GRAFT  1997   CABG X5   EYE SURGERY     2 laser surgeries on left eye with implant   EYE SURGERY Bilateral    cataracts   FEMUR IM NAIL Left 08/28/2018   Procedure: INTRAMEDULLARY (IM) NAIL FEMORAL;  Surgeon: Samson Frederic, MD;  Location: [facility];  Service: Orthopedics;  Laterality: Left;   FRACTURE SURGERY     HERNIA REPAIR     LAPAROSCOPIC ASSISTED VENTRAL HERNIA REPAIR  09/2008    with incarcerated colon;  Dr. Ezzard Standing   MOHS SURGERY  06/2016   [facility]   PATELLA FRACTURE SURGERY Left 11/1994   "crushed my knee"; put in a plate & 6 screws"   POLYPECTOMY     RE-EXCISION OF BREAST LUMPECTOMY Left 05/18/2022   Procedure: RE-EXCISION OF LEFT BREAST LUMPECTOMY;  Surgeon: Harriette Bouillon, MD;  Location: [facility];  Service: General;  Laterality: Left;  60 MIN ROOM 8   REFRACTIVE SURGERY Left 07/30/2015   resection of small bowel carcinoma  06/1998   Dr. Samuella Cota   SHOULDER ARTHROSCOPY W/ ROTATOR CUFF REPAIR Right 1997   SMALL INTESTINE SURGERY     TONSILLECTOMY  1960s   TOTAL KNEE ARTHROPLASTY WITH HARDWARE REMOVAL Left 12/1994   (infex, hardware removed )- not replacement per patient   TUBAL LIGATION  1966    OB History   No obstetric history on file.      Home Medications    Prior to Admission medications   Medication Sig Start Date End Date Taking? Authorizing  Provider  acetaminophen (TYLENOL) 500 MG tablet Take 1 tablet (500 mg total) by mouth every 6 (six) hours as needed. 12/25/21   Redwine, Madison A, PA-C  amLODipine (NORVASC) 5 MG tablet Take 1 tablet by mouth daily 11/28/22   Thapa, [place], MD  aspirin 81 MG chewable tablet Chew 1 tablet (81 mg total) by mouth daily. 06/22/21   Wanda Plump, MD  atenolol (TENORMIN) 100 MG tablet Take 1 tablet (100 mg total) by mouth daily. 02/19/23   Wanda Plump, MD  atorvastatin (LIPITOR) 20 MG tablet TAKE 1 TABLET(20 MG) BY MOUTH DAILY 03/22/23   Runell Gess, MD  Azelastine HCl 137 MCG/SPRAY SOLN Place into the nose at bedtime as needed.    [provider]  Calcium Carb-Cholecalciferol (CALCIUM 500+D  PO) Take by mouth daily.    [provider]  clopidogrel (PLAVIX) 75 MG tablet Take 1 tablet (75 mg total) by mouth daily. 01/25/23   Wanda Plump, MD  Continuous Glucose Sensor (FREESTYLE LIBRE 3 PLUS SENSOR) MISC 1 each by Does not apply route continuous. Change every 15 days. 11/29/22   Thapa, [place], MD  diazepam (VALIUM) 5 MG tablet Take 1 tablet (5 mg total) by mouth every 6 (six) hours as needed for anxiety. 07/20/22   Josph Macho, MD  fluticasone (FLONASE) 50 MCG/ACT nasal spray SHAKE LIQUID AND USE 2 SPRAYS IN EACH NOSTRIL DAILY 09/19/17   Wanda Plump, MD  Glucagon (GVOKE HYPOPEN 1-PACK) 1 MG/0.2ML SOAJ Inject 1 mg into the skin as needed (low blood sugar with impaired consciousness). 10/17/22   Thapa, [place], MD  glucose blood (ONETOUCH ULTRA) test strip USE AS DIRECTED THREE TIMES DAILY 04/26/22   Reather Littler, MD  insulin NPH Human (NOVOLIN N) 100 UNIT/ML injection Inject 20 units into the skin every morning and inject 10 units into the skin at evening meal. 11/29/22   Thapa, [place], MD  insulin regular (NOVOLIN R) 100 units/mL injection Inject 0.06 mLs (6 Units total) into the skin 2 (two) times daily before a meal. 01/30/23   Thapa, [place], MD  Insulin Syringe-Needle U-100 (INSULIN SYRINGE  .5CC/31GX5/16") 31G X 5/16" 0.5 ML MISC USE TO INJECT INSULIN 5 TIMES PER DAY 08/28/19   Reather Littler, MD  irbesartan (AVAPRO) 300 MG tablet TAKE 1/2 TABLET(150 MG) BY MOUTH DAILY 04/28/22   Runell Gess, MD  isosorbide mononitrate (IMDUR) 30 MG 24 hr tablet TAKE 1 TABLET BY MOUTH EVERY DAY 04/28/22   Runell Gess, MD  metFORMIN (GLUCOPHAGE) 500 MG tablet TAKE 1 TABLET(500 MG) BY MOUTH DAILY WITH SUPPER 12/04/22   Thapa, [place], MD  mometasone (ELOCON) 0.1 % cream Apply 1 Application topically daily as needed (itchy ears). Use as directed 04/18/16   [provider]  Multiple Vitamin (MULTIVITAMIN) capsule Take 1 capsule by mouth daily.    [provider]  omeprazole (PRILOSEC) 20 MG capsule TAKE 1 CAPSULE(20 MG) BY MOUTH DAILY 12/04/22   Josph Macho, MD  ondansetron (ZOFRAN) 4 MG tablet Take 1 tablet (4 mg total) by mouth every 8 (eight) hours as needed for nausea or vomiting. 05/02/22   Cornett, Maisie Fus, MD  Semaglutide,0.25 or 0.5MG /DOS, (OZEMPIC, 0.25 OR 0.5 MG/DOSE,) 2 MG/3ML SOPN Inject 0.5 mg into the skin every Monday. 04/09/23   Thapa, [place], MD  venetoclax (VENCLEXTA) 100 MG tablet Take 1 tablet (100 mg total) by mouth daily. Tablets should be swallowed whole with a meal and a full glass of water. 02/16/23   Josph Macho, MD    Family History Family History  Problem Relation Age of Onset   Heart disease Mother 35   Heart disease Father 44       MI   Hypertension Child    Heart attack Child    Diabetes Child    Heart disease Maternal Aunt        x 2, all deceased   Heart disease Maternal Uncle        x 4, all deceased   Emphysema Brother 76   Colon cancer Neg Hx    Breast cancer Neg Hx    Rectal cancer Neg Hx    Stomach cancer Neg Hx     Social History Social History   Tobacco Use   Smoking status: Never  Smokeless tobacco: Never  Vaping Use   Vaping status: Never Used  Substance Use Topics   Alcohol use: No    Alcohol/week: 0.0 standard  drinks of alcohol   Drug use: No     Allergies   Codeine, Ibuprofen, Meperidine hcl, Naproxen sodium, Tramadol, and Tylenol with codeine #3 [acetaminophen-codeine]   Review of Systems Review of Systems  Constitutional:  Positive for appetite change and fatigue. Negative for fever.  HENT:  Negative for ear pain, rhinorrhea and sore throat.   Respiratory:  Positive for shortness of breath. Negative for cough.   Gastrointestinal:  Positive for diarrhea, nausea and vomiting. Negative for abdominal pain.  Genitourinary:  Negative for dysuria.  Neurological:  Positive for dizziness and weakness. Negative for headaches.     Physical Exam Triage Vital Signs ED Triage Vitals  Encounter Vitals Group     BP --      Systolic BP Percentile --      Diastolic BP Percentile --      Pulse --      Resp 04/07/23 1206 16     Temp 04/07/23 1206 (!) 97.5 F (36.4 C)     Temp Source 04/07/23 1206 Oral     SpO2 04/07/23 1206 97 %     Weight --      Height --      Head Circumference --      Peak Flow --      Pain Score 04/07/23 1204 0     Pain Loc --      Pain Education --      Exclude from Growth Chart --    Orthostatic VS for the past 24 hrs:  BP- Lying Pulse- Lying BP- Sitting Pulse- Sitting  04/07/23 1206 122/66 64 122/68 63    Updated Vital Signs Temp (!) 97.5 F (36.4 C) (Oral)   Resp 16   SpO2 97%   Visual Acuity Right Eye Distance:   Left Eye Distance:   Bilateral Distance:    Right Eye Near:   Left Eye Near:    Bilateral Near:     Physical Exam Vitals and nursing note reviewed.  Constitutional:      Appearance: She is not ill-appearing.  HENT:     Head: Normocephalic and atraumatic.     Right Ear: Tympanic membrane and ear canal normal.     Left Ear: Tympanic membrane and ear canal normal.     Mouth/Throat:     Mouth: Mucous membranes are moist.  Eyes:     Conjunctiva/sclera: Conjunctivae normal.  Cardiovascular:     Rate and Rhythm: Normal rate and regular  rhythm.     Heart sounds: Normal heart sounds.  Pulmonary:     Effort: Pulmonary effort is normal.     Comments: Patient is dyspneic after ambulation resolves with rest Abdominal:     General: Bowel sounds are normal.     Palpations: Abdomen is soft.     Tenderness: There is abdominal tenderness. There is no right CVA tenderness, left CVA tenderness, guarding or rebound.  Neurological:     General: No focal deficit present.     Comments: Ambulates with a cane      UC Treatments / Results  Labs (all labs ordered are listed, but only abnormal results are displayed) Labs Reviewed  POCT URINALYSIS DIP (MANUAL ENTRY) - Abnormal; Notable for the following components:      Result Value   Clarity, UA hazy (*)    Blood,  UA moderate (*)    Protein Ur, POC =30 (*)    Leukocytes, UA Small (1+) (*)    All other components within normal limits  POCT FASTING CBG [facility] MANUAL ENTRY - Abnormal; Notable for the following components:   POCT Glucose ([facility]) 149 (*)    All other components within normal limits  URINE CULTURE  POC COVID19/FLU A&B COMBO    EKG   Radiology DG Chest Portable 1 View Result Date: 04/07/2023 CLINICAL DATA:  Shortness of breath. EXAM: PORTABLE CHEST 1 VIEW COMPARISON:  Chest radiograph dated 05/02/2019. FINDINGS: No focal consolidation, pleural effusion or pneumothorax. The cardiac silhouette is within normal limits. Median sternotomy wires and CABG vascular clips. No acute osseous pathology. IMPRESSION: No active disease. Electronically Signed   By: Elgie Collard M.D.   On: 04/07/2023 14:27    Procedures Procedures (including critical care time)  Medications Ordered in UC Medications - No data to display  Initial Impression / Assessment and Plan / UC Course  I have reviewed the triage vital signs and the nursing notes.  Pertinent labs & imaging results that were available during my care of the patient were reviewed by me and considered in my medical decision  making (see chart for details).     83 year old female with generalized weakness and dyspnea with exertion and diarrhea for 5 days vomiting now resolved.  She does become dyspneic with ambulation, EKG Reviewed by me degree AV block, right bundle branch block no change from previous EKG, normal axis,No acute ischemic findings.  Point-of-care glucose is 149, point-of-care urine dipstick is hazy with moderate blood 30 of protein and small leuks discussed with patient this may be UTI given her recent diarrhea however she denied dysuria.  She does have left lower quadrant tenderness on exam recommend ED evaluation as she will need lab work to rule out electrolyte abnormality and possible advanced imaging She was stable upon discharge Final Clinical Impressions(s) / UC Diagnoses   Final diagnoses:  Weakness  Diarrhea, unspecified type  Left lower quadrant abdominal tenderness with rebound tenderness     Discharge Instructions      Go to the emergency department for further evaluation and treatment   ED Prescriptions   None    PDMP not reviewed this encounter.   Meliton Rattan, [place] 04/07/23 1445

## 2023-04-07 NOTE — H&P (Addendum)
 History and Physical    PatientKyndahl Jablon Myers ZOX:[id number] DOB: 08/23/1940 DOA: 04/07/2023 DOS: the patient was seen and examined on 04/07/2023 PCP: Wanda Plump, MD  Patient coming from: Home  Chief Complaint:  Chief Complaint  Patient presents with   Nausea   Emesis   Shortness of Breath   HPI: [name] is a 83 y.o. 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  Patient reports that over the last week she has been having diarrhea more than 5 episode watery stool.  Denies abdominal pain.  She also report multiple episode of vomiting liquid, denies any blood on it.  Maybe a small amount of dark fluid on the vomit, last time that she vomited was Tuesday.  She reports that her urine is really dark and has a strong odor.  She denies dysuria, or increased frequency.  She has been having some worsening shortness of breath over the last week.  She has chronic shortness of breath but over the last week has been worse.  Shortness of breath is especially worse on exertion.   Evaluation in the ED: He sodium 137, potassium 4.2, sodium 137, potassium 4.2, BUN 46, creatinine 2.0, calcium 7.3, lipase 64, liver function test normal, previous creatinine 1.3, white blood cell 6.0, hemoglobin 11, platelets 176, UA with large leukocytes, white blood cells 21-50.  Chest x-ray: No active disease     Review of Systems: As mentioned in the history of present illness. All other systems reviewed and are negative. 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   Clotting disorder (HCC)    Depression    DJD (degenerative joint disease)    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   Gastritis    GERD (gastroesophageal reflux disease)    Headache(784.0)    History of blood transfusion    "I"ve had 20 some; thru birth of children, loss of blood, last 2 were in ~ 1999 before my cancer surgery" (09/16/2015)   History of hiatal hernia    History of shingles    Hx of colonic polyps    Hyperlipidemia    Hypertension    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HCC) 2000   Myocardial infarction (HCC)    1997   Pneumonia    X 1   Postoperative hematoma involving circulatory system following cardiac catheterization 09/17/2015   S/P cardiac cath 09/16/15 09/17/2015   Type II diabetes mellitus (HCC)    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[facility])    as a child   Venous insufficiency    Past Surgical History:  Procedure Laterality Date   BREAST BIOPSY Left 03/10/2022   MM LT BREAST BX W LOC DEV 1ST LESION IMAGE BX SPEC STEREO GUIDE 03/10/2022 [facility]   BREAST BIOPSY  05/01/2022   MM LT RADIOACTIVE SEED LOC MAMMO GUIDE 05/01/2022 [facility]   BREAST LUMPECTOMY  WITH RADIOACTIVE SEED LOCALIZATION Left 05/02/2022   Procedure: LEFT BREAST LUMPECTOMY WITH RADIOACTIVE SEED LOCALIZATION;  Surgeon: Harriette Bouillon, MD;  Location: [facility] OR;  Service: General;  Laterality: Left;   CARDIAC CATHETERIZATION  1997; 2008; 09/16/2015   CARDIAC CATHETERIZATION N/A 09/16/2015   Procedure: Right/Left Heart Cath and Coronary/Graft Angiography;  Surgeon: Runell Gess, MD;  Location: [facility] INVASIVE CV [facility];  Service: Cardiovascular;  Laterality: N/A;   CESAREAN SECTION  1966   COLONOSCOPY     CORONARY ARTERY BYPASS GRAFT  1997   CABG X5   EYE SURGERY     2 laser surgeries on left eye with implant   EYE SURGERY Bilateral     cataracts   FEMUR IM NAIL Left 08/28/2018   Procedure: INTRAMEDULLARY (IM) NAIL FEMORAL;  Surgeon: Samson Frederic, MD;  Location: [facility]S;  Service: Orthopedics;  Laterality: Left;   FRACTURE SURGERY     HERNIA REPAIR     LAPAROSCOPIC ASSISTED VENTRAL HERNIA REPAIR  09/2008    with incarcerated colon;  Dr. Ezzard Standing   MOHS SURGERY  06/2016   [facility]   PATELLA FRACTURE SURGERY Left 11/1994   "crushed my knee"; put in a plate & 6 screws"   POLYPECTOMY     RE-EXCISION OF BREAST LUMPECTOMY Left 05/18/2022   Procedure: RE-EXCISION OF LEFT BREAST LUMPECTOMY;  Surgeon: Harriette Bouillon, MD;  Location: [facility];  Service: General;  Laterality: Left;  60 MIN ROOM 8   REFRACTIVE SURGERY Left 07/30/2015   resection of small bowel carcinoma  06/1998   Dr. Samuella Cota   SHOULDER ARTHROSCOPY W/ ROTATOR CUFF REPAIR Right 1997   SMALL INTESTINE SURGERY     TONSILLECTOMY  1960s   TOTAL KNEE ARTHROPLASTY WITH HARDWARE REMOVAL Left 12/1994   (infex, hardware removed )- not replacement per patient   TUBAL LIGATION  1966   Social History:  reports that she has never smoked. She has never used smokeless tobacco. She reports that she does not drink alcohol and does not use drugs.  Allergies  Allergen Reactions   Codeine Other (See Comments)    REACTION: makes her nervous, orTylenol #3   Ibuprofen Other (See Comments)    REACTION: nervous   Meperidine Hcl Nausea And Vomiting   Naproxen Sodium Other (See Comments)    Or Advil  REACTION: nervous   Tramadol Other (See Comments)    Insomnia    Tylenol With Codeine #3 [Acetaminophen-Codeine] Nausea And Vomiting    Family History  Problem Relation Age of Onset   Heart disease Mother 20   Heart disease Father 13       MI   Hypertension Child    Heart attack Child    Diabetes Child    Heart disease Maternal Aunt        x 2, all deceased   Heart disease Maternal Uncle        x 4, all deceased   Emphysema Brother 52   Colon cancer Neg Hx     Breast cancer Neg Hx    Rectal cancer Neg Hx    Stomach cancer Neg Hx     Prior to Admission medications   Medication Sig Start Date End Date Taking? Authorizing Provider  acetaminophen (TYLENOL) 500 MG tablet Take 1 tablet (500 mg total) by mouth every 6 (six) hours as needed. 12/25/21   Redwine, Madison A, PA-C  amLODipine (NORVASC) 5 MG tablet Take 1 tablet by mouth daily 11/28/22   Thapa, [place], MD  aspirin 81 MG chewable tablet Chew 1 tablet (81 mg total) by mouth daily. 06/22/21   Wanda Plump, MD  atenolol (TENORMIN) 100 MG tablet Take 1 tablet (100 mg total) by mouth daily. 02/19/23   Wanda Plump, MD  atorvastatin (LIPITOR) 20 MG tablet TAKE 1 TABLET(20 MG) BY MOUTH DAILY Patient taking differently: Take 20 mg by mouth daily. 03/22/23   Runell Gess, MD  Azelastine HCl 137 MCG/SPRAY SOLN Place into the nose at bedtime as needed.    [provider]  Calcium Carb-Cholecalciferol (CALCIUM 500+D PO) Take by mouth daily.    [provider]  clopidogrel (PLAVIX) 75 MG tablet Take 1 tablet (75 mg total) by mouth daily. 01/25/23   Wanda Plump, MD  Continuous Glucose Sensor (FREESTYLE LIBRE 3 PLUS SENSOR) MISC 1 each by Does not apply route continuous. Change every 15 days. 11/29/22   Thapa, [place], MD  diazepam (VALIUM) 5 MG tablet Take 1 tablet (5 mg total) by mouth every 6 (six) hours as needed for anxiety. 07/20/22   Josph Macho, MD  fluticasone (FLONASE) 50 MCG/ACT nasal spray SHAKE LIQUID AND USE 2 SPRAYS IN EACH NOSTRIL DAILY Patient taking differently: Place 2 sprays into both nostrils daily. 09/19/17   Wanda Plump, MD  Glucagon (GVOKE HYPOPEN 1-PACK) 1 MG/0.2ML SOAJ Inject 1 mg into the skin as needed (low blood sugar with impaired consciousness). 10/17/22   Thapa, [place], MD  glucose blood (ONETOUCH ULTRA) test strip USE AS DIRECTED THREE TIMES DAILY 04/26/22   Reather Littler, MD  insulin NPH Human (NOVOLIN N) 100 UNIT/ML injection Inject 20 units into the skin every  morning and inject 10 units into the skin at evening meal. 11/29/22   Thapa, [place], MD  insulin regular (NOVOLIN R) 100 units/mL injection Inject 0.06 mLs (6 Units total) into the skin 2 (two) times daily before a meal. 01/30/23   Thapa, [place], MD  Insulin Syringe-Needle U-100 (INSULIN SYRINGE .5CC/31GX5/16") 31G X 5/16" 0.5 ML MISC USE TO INJECT INSULIN 5 TIMES PER DAY 08/28/19   Reather Littler, MD  irbesartan (AVAPRO) 300 MG tablet TAKE 1/2 TABLET(150 MG) BY MOUTH DAILY Patient taking differently: Take 150 mg by mouth daily. 04/28/22   Runell Gess, MD  isosorbide mononitrate (IMDUR) 30 MG 24 hr tablet TAKE 1 TABLET BY MOUTH EVERY DAY 04/28/22   Runell Gess, MD  metFORMIN (GLUCOPHAGE) 500 MG tablet TAKE 1 TABLET(500 MG) BY MOUTH DAILY WITH SUPPER Patient taking differently: Take 500 mg by mouth daily with supper. 12/04/22   Thapa, [place], MD  mometasone (ELOCON) 0.1 % cream Apply 1 Application topically daily as needed (itchy ears). Use as directed 04/18/16   [provider]  Multiple Vitamin (MULTIVITAMIN) capsule Take 1 capsule by mouth daily.    [provider]  omeprazole (PRILOSEC) 20 MG capsule TAKE 1 CAPSULE(20 MG) BY MOUTH DAILY Patient taking differently: Take 20 mg by mouth daily. 12/04/22   Josph Macho, MD  ondansetron (ZOFRAN) 4 MG tablet Take 1 tablet (4 mg total) by mouth every 8 (eight) hours as needed for nausea or vomiting. 05/02/22   Harriette Bouillon, MD  Semaglutide,0.25 or 0.5MG /DOS, (OZEMPIC, 0.25 OR 0.5 MG/DOSE,) 2 MG/3ML SOPN Inject 0.5 mg into the skin every Monday. 04/09/23   Thapa, [place], MD  venetoclax (VENCLEXTA) 100 MG tablet Take 1 tablet (100 mg total) by mouth daily. Tablets should be swallowed whole with a meal and a full glass of water. 02/16/23  Josph Macho, MD    Physical Exam: Vitals:   04/07/23 1324 04/07/23 1445 04/07/23 1600 04/07/23 1601  BP: (!) 151/65 131/67 (!) 127/58   Pulse: 66 67 65   Resp: 17 14 18    Temp: 97.6 F (36.4  C)   (!) 97.5 F (36.4 C)  TempSrc: Oral   Oral  SpO2: 100% 100% 100%   Weight:      Height:       General; no acute distress very pleasant CVS; S1-S2 regular rhythm or rate, systolic murmur Lungs; normal respiratory effort, clear to auscultation Abdomen; abdomen obese, bowel sounds present, soft nontender nondistended no guarding Extremities: No edema Neuro: He is alert and conversant, answering question appropriately, moves all 4 extremities  Data Reviewed:  Labs reviewed.   Assessment and Plan: No notes have been filed under this hospital service. Service: Hospitalist  1-Gastroenteritis Patient present with nausea vomiting and diarrhea over the last week.  She reports the last time that she vomited was late Tuesday.  Since she has been in the ED she has not had a bowel movement. - GI pathogen and C. difficile ordered - Plan to resume carb modified diet - Plan to give IV fluids   AKI; Presented with a creatinine of 2.0, previous creatinine range 1.3 In the setting of hypovolemia, nausea vomiting diarrhea and ARB use - Plan to hold ARB - Continue IV fluids - Strict I's and O's  History of CAD, hypertension -Continue Lipitor, atenolol, Plavix, and Norvasc - Will hold Avapro due to AKI - Continue Imdur   Chronic acute on chronic dyspnea on exertion - For worsening dyspnea, hemoglobin is stable, chest x-ray is normal - Proceed to get 2D echo   History of CLL; Resume venetoclax  GERD:  On PPI  Diabetes type 2, hypoglycemia in the setting of poor oral intake, - Hold metformin while in the hospital - If renal function not improve might need to hold metformin at discharge - Monitor CBG, scale insulin  History of iron deficiency anemia: Hemoglobin stable, monitor  History of colon cancer, remote History of DCIS left breast cancer status postlumpectomy - Follow  up outpatient  Pyuria: Will check urine culture    Advance Care Planning:   Code Status: Prior  patient wishes to be DNR, if she still has a pulse and is breathing she is okay with short term intubation  Consults: None  Family Communication: Daughter who was at bedside  Severity of Illness: The appropriate patient status for this patient is OBSERVATION. Observation status is judged to be reasonable and necessary in order to provide the required intensity of service to ensure the patient's safety. The patient's presenting symptoms, physical exam findings, and initial radiographic and laboratory data in the context of their medical condition is felt to place them at decreased risk for further clinical deterioration. Furthermore, it is anticipated that the patient will be medically stable for discharge from the hospital within 2 midnights of admission.   Author: Alba Cory, MD 04/07/2023 5:11 PM  For on call review [email or web address].

## 2023-04-07 NOTE — ED Triage Notes (Signed)
 Patient presents to UC with family for N/V/D and dizziness since 04/01 post iron infusion. States the iron infusion she received was not the same one she has had in the past. Hx of SOB this time it worse and increased SOB with activity.  She called triage RN at the cancer center who instructed her to come to UC to be evaluated for the dizziness, COVID/FLU testing and to rule out dehydration. Treating with antiemetic med with no relief.   Denies chest pain.

## 2023-04-08 ENCOUNTER — Encounter (HOSPITAL_COMMUNITY): Payer: Self-pay | Admitting: Internal Medicine

## 2023-04-08 ENCOUNTER — Observation Stay (HOSPITAL_COMMUNITY)

## 2023-04-08 DIAGNOSIS — M810 Age-related osteoporosis without current pathological fracture: Secondary | ICD-10-CM | POA: Diagnosis present

## 2023-04-08 DIAGNOSIS — Z794 Long term (current) use of insulin: Secondary | ICD-10-CM | POA: Diagnosis not present

## 2023-04-08 DIAGNOSIS — C9110 Chronic lymphocytic leukemia of B-cell type not having achieved remission: Secondary | ICD-10-CM | POA: Diagnosis present

## 2023-04-08 DIAGNOSIS — I2510 Atherosclerotic heart disease of native coronary artery without angina pectoris: Secondary | ICD-10-CM | POA: Diagnosis present

## 2023-04-08 DIAGNOSIS — I252 Old myocardial infarction: Secondary | ICD-10-CM | POA: Diagnosis not present

## 2023-04-08 DIAGNOSIS — E872 Acidosis, unspecified: Secondary | ICD-10-CM | POA: Diagnosis present

## 2023-04-08 DIAGNOSIS — Z8249 Family history of ischemic heart disease and other diseases of the circulatory system: Secondary | ICD-10-CM | POA: Diagnosis not present

## 2023-04-08 DIAGNOSIS — R0602 Shortness of breath: Secondary | ICD-10-CM | POA: Diagnosis present

## 2023-04-08 DIAGNOSIS — Z96659 Presence of unspecified artificial knee joint: Secondary | ICD-10-CM | POA: Diagnosis present

## 2023-04-08 DIAGNOSIS — Z66 Do not resuscitate: Secondary | ICD-10-CM | POA: Diagnosis present

## 2023-04-08 DIAGNOSIS — R0609 Other forms of dyspnea: Secondary | ICD-10-CM

## 2023-04-08 DIAGNOSIS — A0472 Enterocolitis due to Clostridium difficile, not specified as recurrent: Secondary | ICD-10-CM | POA: Diagnosis present

## 2023-04-08 DIAGNOSIS — I10 Essential (primary) hypertension: Secondary | ICD-10-CM | POA: Diagnosis present

## 2023-04-08 DIAGNOSIS — E11649 Type 2 diabetes mellitus with hypoglycemia without coma: Secondary | ICD-10-CM | POA: Diagnosis present

## 2023-04-08 DIAGNOSIS — Z7902 Long term (current) use of antithrombotics/antiplatelets: Secondary | ICD-10-CM | POA: Diagnosis not present

## 2023-04-08 DIAGNOSIS — Z79899 Other long term (current) drug therapy: Secondary | ICD-10-CM | POA: Diagnosis not present

## 2023-04-08 DIAGNOSIS — D509 Iron deficiency anemia, unspecified: Secondary | ICD-10-CM | POA: Diagnosis present

## 2023-04-08 DIAGNOSIS — Z85828 Personal history of other malignant neoplasm of skin: Secondary | ICD-10-CM | POA: Diagnosis not present

## 2023-04-08 DIAGNOSIS — Z1152 Encounter for screening for COVID-19: Secondary | ICD-10-CM | POA: Diagnosis not present

## 2023-04-08 DIAGNOSIS — K529 Noninfective gastroenteritis and colitis, unspecified: Secondary | ICD-10-CM | POA: Diagnosis not present

## 2023-04-08 DIAGNOSIS — E860 Dehydration: Secondary | ICD-10-CM | POA: Diagnosis present

## 2023-04-08 DIAGNOSIS — Z7984 Long term (current) use of oral hypoglycemic drugs: Secondary | ICD-10-CM | POA: Diagnosis not present

## 2023-04-08 DIAGNOSIS — K219 Gastro-esophageal reflux disease without esophagitis: Secondary | ICD-10-CM | POA: Diagnosis present

## 2023-04-08 DIAGNOSIS — Z951 Presence of aortocoronary bypass graft: Secondary | ICD-10-CM | POA: Diagnosis not present

## 2023-04-08 DIAGNOSIS — N179 Acute kidney failure, unspecified: Secondary | ICD-10-CM | POA: Diagnosis present

## 2023-04-08 DIAGNOSIS — Z7982 Long term (current) use of aspirin: Secondary | ICD-10-CM | POA: Diagnosis not present

## 2023-04-08 LAB — COMPREHENSIVE METABOLIC PANEL WITH GFR
ALT: 30 U/L (ref 0–44)
AST: 29 U/L (ref 15–41)
Albumin: 3 g/dL — ABNORMAL LOW (ref 3.5–5.0)
Alkaline Phosphatase: 48 U/L (ref 38–126)
Anion gap: 8 (ref 5–15)
BUN: 31 mg/dL — ABNORMAL HIGH (ref 8–23)
CO2: 17 mmol/L — ABNORMAL LOW (ref 22–32)
Calcium: 6.7 mg/dL — ABNORMAL LOW (ref 8.9–10.3)
Chloride: 115 mmol/L — ABNORMAL HIGH (ref 98–111)
Creatinine, Ser: 1.29 mg/dL — ABNORMAL HIGH (ref 0.44–1.00)
GFR, Estimated: 41 mL/min — ABNORMAL LOW (ref >60)
Glucose, Bld: 156 mg/dL — ABNORMAL HIGH (ref 70–99)
Potassium: 3.5 mmol/L (ref 3.5–5.1)
Sodium: 140 mmol/L (ref 135–145)
Total Bilirubin: 0.6 mg/dL (ref 0.0–1.2)
Total Protein: 5.7 g/dL — ABNORMAL LOW (ref 6.5–8.1)

## 2023-04-08 LAB — CBC
HCT: 30.9 % — ABNORMAL LOW (ref 36.0–46.0)
Hemoglobin: 10.2 g/dL — ABNORMAL LOW (ref 12.0–15.0)
MCH: 33.4 pg (ref 26.0–34.0)
MCHC: 33 g/dL (ref 30.0–36.0)
MCV: 101.3 fL — ABNORMAL HIGH (ref 80.0–100.0)
Platelets: 143 10*3/uL — ABNORMAL LOW (ref 150–400)
RBC: 3.05 MIL/uL — ABNORMAL LOW (ref 3.87–5.11)
RDW: 13.6 % (ref 11.5–15.5)
WBC: 4 10*3/uL (ref 4.0–10.5)
nRBC: 0 % (ref 0.0–0.2)

## 2023-04-08 LAB — GLUCOSE, CAPILLARY
Glucose-Capillary: 152 mg/dL — ABNORMAL HIGH (ref 70–99)
Glucose-Capillary: 156 mg/dL — ABNORMAL HIGH (ref 70–99)
Glucose-Capillary: 172 mg/dL — ABNORMAL HIGH (ref 70–99)
Glucose-Capillary: 165 mg/dL — ABNORMAL HIGH (ref 70–99)

## 2023-04-08 LAB — ECHOCARDIOGRAM COMPLETE
AR max vel: 2.01 cm2
AV Area VTI: 2.03 cm2
AV Area mean vel: 1.99 cm2
AV Mean grad: 4 mmHg
AV Peak grad: 7.8 mmHg
Ao pk vel: 1.4 m/s
Area-P 1/2: 3.48 cm2
Calc EF: 61.2 %
Height: 65 in
MV VTI: 1.49 cm2
S' Lateral: 3.4 cm
Single Plane A2C EF: 62.2 %
Single Plane A4C EF: 60.5 %
Weight: 2751.34 [oz_av]

## 2023-04-08 LAB — C DIFFICILE QUICK SCREEN W PCR REFLEX
C Diff antigen: POSITIVE — AB
C Diff toxin: NEGATIVE

## 2023-04-08 LAB — URINE CULTURE
Culture: 60000 — AB
Culture: 40000 — AB

## 2023-04-08 LAB — CLOSTRIDIUM DIFFICILE BY PCR, REFLEXED: Toxigenic C. Difficile by PCR: POSITIVE — AB

## 2023-04-08 MED ORDER — ENOXAPARIN SODIUM 40 MG/0.4ML IJ SOSY
40.0000 mg | PREFILLED_SYRINGE | INTRAMUSCULAR | Status: DC
  Administered 2023-04-08: 40 mg via SUBCUTANEOUS
  Filled 2023-04-08: qty 0.4

## 2023-04-08 MED ORDER — SODIUM BICARBONATE 650 MG PO TABS
1300.0000 mg | ORAL_TABLET | Freq: Three times a day (TID) | ORAL | Status: DC
Start: 2023-04-08 — End: 2023-04-08
  Filled 2023-04-08: qty 2

## 2023-04-08 MED ORDER — SODIUM CHLORIDE 0.9 % IV SOLN: INTRAVENOUS | Status: DC

## 2023-04-08 MED ORDER — SODIUM BICARBONATE 650 MG PO TABS
1300.0000 mg | ORAL_TABLET | Freq: Two times a day (BID) | ORAL | Status: DC
  Administered 2023-04-08 – 2023-04-09 (×2): 1300 mg via ORAL
  Filled 2023-04-08 (×2): qty 2

## 2023-04-08 MED ORDER — VANCOMYCIN HCL 125 MG PO CAPS
125.0000 mg | ORAL_CAPSULE | Freq: Four times a day (QID) | ORAL | Status: DC
  Administered 2023-04-08 – 2023-04-09 (×3): 125 mg via ORAL
  Filled 2023-04-08 (×4): qty 1

## 2023-04-08 MED ORDER — ATENOLOL 25 MG PO TABS
50.0000 mg | ORAL_TABLET | Freq: Every day | ORAL | Status: DC
Start: 2023-04-08 — End: 2023-04-09
  Administered 2023-04-08 – 2023-04-09 (×2): 50 mg via ORAL
  Filled 2023-04-08 (×2): qty 2

## 2023-04-08 MED ORDER — CALCIUM CARBONATE 1250 (500 CA) MG PO TABS
1.0000 | ORAL_TABLET | Freq: Three times a day (TID) | ORAL | Status: DC
Start: 2023-04-08 — End: 2023-04-09
  Filled 2023-04-08 (×2): qty 1

## 2023-04-08 MED ORDER — BOOST / RESOURCE BREEZE PO LIQD CUSTOM
1.0000 | Freq: Three times a day (TID) | ORAL | Status: DC
Start: 2023-04-08 — End: 2023-04-09
  Administered 2023-04-08: 1 via ORAL

## 2023-04-08 NOTE — Progress Notes (Signed)
 Mobility Specialist - Progress Note   04/08/23 1024  Mobility  Activity Ambulated with assistance in hallway  Level of Assistance Modified independent, requires aide device or extra time  Assistive Device Cane  Distance Ambulated (ft) 500 ft  Activity Response Tolerated well  Mobility Referral Yes  Mobility visit 1 Mobility  Mobility Specialist Start Time (ACUTE ONLY) 0946  Mobility Specialist Stop Time (ACUTE ONLY) 0959  Mobility Specialist Time Calculation (min) (ACUTE ONLY) 13 min   Pt received in bed and agreeable to mobility. No complaints during session. Pt to bed after session with all needs met.    [facility]

## 2023-04-08 NOTE — Plan of Care (Signed)

## 2023-04-08 NOTE — Progress Notes (Signed)
 Echocardiogram 2D Echocardiogram has been performed.  Crescent Gotham N Nicey Krah,RDCS 04/08/2023, 8:35 AM

## 2023-04-08 NOTE — Progress Notes (Signed)
 PROGRESS NOTE    [name]  VWU:[id number] DOB: 1940-05-24 DOA: 04/07/2023 PCP: Wanda Plump, MD   Brief Narrative: 83 y.o. 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   Patient found to be in AKI with a creatinine of 2.0, previous creatinine 1.3  Assessment & Plan:   Principal Problem:   Gastroenteritis  1-Gastroenteritis Patient present with nausea vomiting and diarrhea over the last week.  She reports the last time that she vomited was late Tuesday.  Since she has been in the ED she has not had a bowel movement. - GI pathogen and C. difficile ordered - tolerating diet.  -Continue with IV fluids.  Had watery stool this morning.    AKI; Presented with a creatinine of 2.0, previous creatinine range 1.3 In the setting of hypovolemia, nausea vomiting diarrhea and ARB use - Continue  to hold ARB - Continue IV fluids - Strict I's and O's Renal function improved, but now has metabolic acidosis.    Metabolic acidosis;  Continue with IV fluids.  Started oral Bicarb.   History of CAD, hypertension -Continue Lipitor, atenolol, Plavix - Will hold Avapro due to AKI. Hold Norvasc, BP soft - Continue Imdur     Chronic acute on chronic dyspnea on exertion - For worsening dyspnea, hemoglobin is stable, chest x-ray is normal - ECHO normal EF, no pulmonary HT>      History of CLL; Resume venetoclax when ok by oncology. Dr Myna Hidalgo added to treatment team.    GERD:  On PPI   Diabetes type 2, hypoglycemia in the setting of poor oral intake, - Hold metformin while in the hospital - If renal function not improve might need to hold metformin at discharge - Monitor CBG, scale insulin   History of iron deficiency anemia: Hemoglobin stable,  monitor   History of colon cancer, remote History of DCIS left breast cancer status postlumpectomy - Follow  up outpatient   Pyuria: Follow  urine culture Hypocalcemia: Started calcium supplementation.  calcium correction by albumin 7  Estimated body mass index is 28.62 kg/m as calculated from the following:   Height as of this encounter: 5\' 5"  (1.651 m).   Weight as of this encounter: 78 kg.   DVT prophylaxis: Start Lovenox Code Status: DNR with intervention Family Communication; daughter at bedside Disposition Plan:  Status is: Observation The patient remains OBS appropriate and will d/c before 2 midnights.    Consultants:  None  Procedures:  Echo normal ejection fraction  Antimicrobials:    Subjective: She is feeling better, she reported she was able to ambulate on the hall today without significant shortness of breath, she felt little bit tired.  She continued to have diarrhea today, had a watery bowel movement today.  Stool sample was not able to be sent, because he was not collected on the heart  Objective: Vitals:   04/07/23 1930 04/08/23 0042 04/08/23 0453 04/08/23 0736  BP: (!) 146/65 134/60 137/64 (!) 105/51  Pulse: 65 64 70 65  Resp: 16 16 16    Temp: 98.1 F (36.7 C) 97.9 F (36.6 C) (!) 97.5 F (36.4 C) 97.6 F (36.4 C)  TempSrc: Oral Oral Oral Oral  SpO2: 100% 99% 97% 100%  Weight:      Height:       No intake  or output data in the 24 hours ending 04/08/23 0753 Filed Weights   04/07/23 1320  Weight: 78 kg    Examination:  General exam: Appears calm and comfortable  Respiratory system: Clear to auscultation. Respiratory effort normal. Cardiovascular system: S1 & S2 heard, RRR. Gastrointestinal system: Abdomen is nondistended, soft and nontender. No organomegaly or masses felt. Normal bowel sounds heard. Central nervous system: Alert and oriented.  Extremities: Symmetric 5 x 5 power.   Data Reviewed: I have personally reviewed following  labs and imaging studies  CBC: Recent Labs  Lab 04/07/23 1459 04/08/23 0532  WBC 6.0 4.0  NEUTROABS 3.4  --   HGB 11.1* 10.2*  HCT 33.9* 30.9*  MCV 101.2* 101.3*  PLT 176 143*   Basic Metabolic Panel: Recent Labs  Lab 04/07/23 1459 04/08/23 0532  NA 137 140  K 4.2 3.5  CL 109 115*  CO2 20* 17*  GLUCOSE 103* 156*  BUN 46* 31*  CREATININE 2.01* 1.29*  CALCIUM 7.3* 6.7*   GFR: Estimated Creatinine Clearance: 34.1 mL/min (A) (by C-G formula based on SCr of 1.29 mg/dL (H)). Liver Function Tests: Recent Labs  Lab 04/07/23 1459 04/08/23 0532  AST 33 29  ALT 39 30  ALKPHOS 49 48  BILITOT 0.7 0.6  PROT 7.0 5.7*  ALBUMIN 3.5 3.0*   Recent Labs  Lab 04/07/23 1459  LIPASE 64*   No results for input(s): "AMMONIA" in the last 168 hours. Coagulation Profile: No results for input(s): "INR", "PROTIME" in the last 168 hours. Cardiac Enzymes: No results for input(s): "CKTOTAL", "CKMB", "CKMBINDEX", "TROPONINI" in the last 168 hours. BNP (last 3 results) No results for input(s): "PROBNP" in the last 8760 hours. HbA1C: No results for input(s): "HGBA1C" in the last 72 hours. CBG: Recent Labs  Lab 04/08/23 0735  GLUCAP 152*   Lipid Profile: No results for input(s): "CHOL", "HDL", "LDLCALC", "TRIG", "CHOLHDL", "LDLDIRECT" in the last 72 hours. Thyroid Function Tests: No results for input(s): "TSH", "T4TOTAL", "FREET4", "T3FREE", "THYROIDAB" in the last 72 hours. Anemia Panel: No results for input(s): "VITAMINB12", "FOLATE", "FERRITIN", "TIBC", "IRON", "RETICCTPCT" in the last 72 hours. Sepsis Labs: No results for input(s): "PROCALCITON", "LATICACIDVEN" in the last 168 hours.  Recent Results (from the past 240 hours)  Resp panel by RT-PCR (RSV, Flu A&B, Covid) Anterior Nasal Swab     Status: None   Collection Time: 04/07/23  2:59 PM   Specimen: Anterior Nasal Swab  Result Value Ref Range Status   SARS Coronavirus 2 by RT PCR NEGATIVE NEGATIVE Final    Comment:  (NOTE) SARS-CoV-2 target nucleic acids are NOT DETECTED.  The SARS-CoV-2 RNA is generally detectable in upper respiratory specimens during the acute phase of infection. The lowest concentration of SARS-CoV-2 viral copies this assay can detect is 138 copies/mL. A negative result does not preclude SARS-Cov-2 infection and should not be used as the sole basis for treatment or other patient management decisions. A negative result may occur with  improper specimen collection/handling, submission of specimen other than nasopharyngeal swab, presence of viral mutation(s) within the areas targeted by this assay, and inadequate number of viral copies(<138 copies/mL). A negative result must be combined with clinical observations, patient history, and epidemiological information. The expected result is Negative.  Fact Sheet for Patients:  BloggerCourse.com  Fact Sheet for Healthcare Providers:  SeriousBroker.it  This test is no t yet approved or cleared by the [place] FDA and  has been authorized for detection and/or diagnosis of SARS-CoV-2 by FDA  under an Emergency Use Authorization (EUA). This EUA will remain  in effect (meaning this test can be used) for the duration of the COVID-19 declaration under Section 564(b)(1) of the Act, 21 U.S.C.section 360bbb-3(b)(1), unless the authorization is terminated  or revoked sooner.       Influenza A by PCR NEGATIVE NEGATIVE Final   Influenza B by PCR NEGATIVE NEGATIVE Final    Comment: (NOTE) The Xpert Xpress SARS-CoV-2/FLU/RSV plus assay is intended as an aid in the diagnosis of influenza from Nasopharyngeal swab specimens and should not be used as a sole basis for treatment. Nasal washings and aspirates are unacceptable for Xpert Xpress SARS-CoV-2/FLU/RSV testing.  Fact Sheet for Patients: BloggerCourse.com  Fact Sheet for Healthcare  Providers: SeriousBroker.it  This test is not yet approved or cleared by the [place] FDA and has been authorized for detection and/or diagnosis of SARS-CoV-2 by FDA under an Emergency Use Authorization (EUA). This EUA will remain in effect (meaning this test can be used) for the duration of the COVID-19 declaration under Section 564(b)(1) of the Act, 21 U.S.C. section 360bbb-3(b)(1), unless the authorization is terminated or revoked.     Resp Syncytial Virus by PCR NEGATIVE NEGATIVE Final    Comment: (NOTE) Fact Sheet for Patients: BloggerCourse.com  Fact Sheet for Healthcare Providers: SeriousBroker.it  This test is not yet approved or cleared by the [place] FDA and has been authorized for detection and/or diagnosis of SARS-CoV-2 by FDA under an Emergency Use Authorization (EUA). This EUA will remain in effect (meaning this test can be used) for the duration of the COVID-19 declaration under Section 564(b)(1) of the Act, 21 U.S.C. section 360bbb-3(b)(1), unless the authorization is terminated or revoked.  Performed at [facility], [address], [place], [place] [address]          Radiology Studies: DG Chest Portable 1 View Result Date: 04/07/2023 CLINICAL DATA:  Shortness of breath. EXAM: PORTABLE CHEST 1 VIEW COMPARISON:  Chest radiograph dated 05/02/2019. FINDINGS: No focal consolidation, pleural effusion or pneumothorax. The cardiac silhouette is within normal limits. Median sternotomy wires and CABG vascular clips. No acute osseous pathology. IMPRESSION: No active disease. Electronically Signed   By: Elgie Collard M.D.   On: 04/07/2023 14:27        Scheduled Meds:  amLODipine  5 mg Oral Daily   atenolol  100 mg Oral Daily   atorvastatin  20 mg Oral Daily   calcium carbonate  1 tablet Oral TID WC   clopidogrel  75 mg Oral Daily   feeding supplement  1  Container Oral TID BM   fluticasone  2 spray Each Nare Daily   insulin aspart  0-6 Units Subcutaneous TID WC   isosorbide mononitrate  30 mg Oral Daily   pantoprazole  40 mg Oral Daily   sodium bicarbonate  1,300 mg Oral TID   Continuous Infusions:  lactated ringers 100 mL/hr at 04/07/23 1946     LOS: 0 days    Time spent: 35 minutes    Kirstyn Lean A Floree Zuniga, MD [facility]   If 7PM-7AM, please contact night-coverage [email or web address]  04/08/2023, 7:53 AM

## 2023-04-09 DIAGNOSIS — K529 Noninfective gastroenteritis and colitis, unspecified: Secondary | ICD-10-CM | POA: Diagnosis not present

## 2023-04-09 LAB — BASIC METABOLIC PANEL WITH GFR
Anion gap: 6 (ref 5–15)
BUN: 18 mg/dL (ref 8–23)
CO2: 20 mmol/L — ABNORMAL LOW (ref 22–32)
Calcium: 6.5 mg/dL — ABNORMAL LOW (ref 8.9–10.3)
Chloride: 117 mmol/L — ABNORMAL HIGH (ref 98–111)
Creatinine, Ser: 1.12 mg/dL — ABNORMAL HIGH (ref 0.44–1.00)
GFR, Estimated: 49 mL/min — ABNORMAL LOW (ref >60)
Glucose, Bld: 112 mg/dL — ABNORMAL HIGH (ref 70–99)
Potassium: 4 mmol/L (ref 3.5–5.1)
Sodium: 143 mmol/L (ref 135–145)

## 2023-04-09 LAB — GLUCOSE, CAPILLARY: Glucose-Capillary: 113 mg/dL — ABNORMAL HIGH (ref 70–99)

## 2023-04-09 MED ORDER — CALCIUM CARBONATE 1250 (500 CA) MG PO TABS: 1.0000 | ORAL_TABLET | Freq: Three times a day (TID) | ORAL | 0 refills | Status: DC

## 2023-04-09 MED ORDER — VANCOMYCIN HCL 125 MG PO CAPS
125.0000 mg | ORAL_CAPSULE | Freq: Four times a day (QID) | ORAL | 0 refills | Status: AC
Start: 2023-04-09 — End: 2023-04-19

## 2023-04-09 MED ORDER — AMLODIPINE BESYLATE 5 MG PO TABS
5.0000 mg | ORAL_TABLET | Freq: Every day | ORAL | Status: DC
  Administered 2023-04-09: 5 mg via ORAL
  Filled 2023-04-09: qty 1

## 2023-04-09 MED ORDER — INSULIN NPH (HUMAN) (ISOPHANE) 100 UNIT/ML ~~LOC~~ SUSP: SUBCUTANEOUS | 3 refills | Status: DC

## 2023-04-09 NOTE — Consult Note (Signed)
 I have been asked to see [name].  She is well-known to me.  She is an 83 year old white female.  She has a history of CLL.  Currently, I think when she is only on venetoclax.  She also has history of DCIS.  She is on no therapy for this.  I last saw her back in March.  She apparently was admitted on 04/07/2023.  She had a lot of diarrhea.  She had some nausea and vomiting.  She does not recall anything that she ate that was unusual.  When she came in, her white count was 6.  Hemoglobin 9.1.  Platelet count 176,000.  She had a normal white cell differential.  Her electrolytes show sodium 137.  Potassium 4.2.  BUN 46 creatinine 2.01.  Calcium 7.3 with an albumin of 3.5.  Today, her sodium is 143.  Potassium 4.0.  BUN 18 creatinine 1.12.  Calcium 6.5.  Cultures are all negative although there is a positive urine culture for Streptococcus.  I think she was tested for C. difficile.  She tested positive for the antigen.  She is negative for the toxin.  I think she is on vancomycin for this  She has had no fever.  She had a chest x-ray when she came in.  This was unremarkable.  She is still having some loose stool.  There is no bleeding.  Her vital signs show temperature of 98.6.  Pulse 67.  Blood pressure 107/61.  Her head and exam shows no ocular or oral lesions.  There is no adenopathy in the neck.  Her lungs sound clear bilaterally.  Cardiac exam regular rate and rhythm.  She has 1/6 systolic ejection murmur.  Abdomen is soft.  Bowel sounds are present.  There is no guarding or rebound tenderness.  There is no fluid wave.  There is no palpable hepatosplenomegaly.  Extremities shows no clubbing, cyanosis or edema.  Neurological exam is nonfocal.   [name] is a very charming 83 year old white female.  She has a history of CLL.  She has responded incredibly well.  She is in a remission.  We have our single agent therapy with venetoclax.  I do not think this is anything related to  venetoclax.  Her immune system should be okay.  The last time that we checked her IgG level was back in August and this was a 45 mg/dL.  This is adequate.  Sound like she is improving on C. difficile therapy.  I will check her thyroid make sure that she does not have any hyperthyroidism.  Hopefully, she will be able to go home soon.  I know that she will get fantastic care from everybody on [address].    Christin Bach, MD  Isaiah 53:5   .

## 2023-04-09 NOTE — Plan of Care (Signed)
   Problem: Coping: Goal: Ability to adjust to condition or change in health will improve Outcome: Progressing   Problem: Fluid Volume: Goal: Ability to maintain a balanced intake and output will improve Outcome: Progressing   Problem: Health Behavior/Discharge Planning: Goal: Ability to identify and utilize available resources and services will improve Outcome: Progressing

## 2023-04-09 NOTE — Progress Notes (Signed)
 Pt brought her home medication, Venclexta 100mg  PO which she takes at 1000 daily. Will pass it to dayshift RN.

## 2023-04-09 NOTE — Discharge Summary (Incomplete)
 Physician Discharge Summary   Patient: [name] MRN: [id number] DOB: 02-03-1940  Admit date:     04/07/2023  Discharge date: 04/09/23  Discharge Physician: Alba Cory   PCP: Wanda Plump, MD   Recommendations at discharge:  {Tip this will not be part of the note when signed- Example include specific recommendations for outpatient follow-up, pending tests to follow-up on. (Optional):26781}  ***  Discharge Diagnoses: Principal Problem:   Gastroenteritis  Resolved Problems:   * No resolved hospital problems. [facility] Course: No notes on file  Assessment and Plan: No notes have been filed under this hospital service. Service: Hospitalist     {Tip this will not be part of the note when signed Body mass index is 28.62 kg/m. , ,  (Optional):26781}  {(NOTE) Pain control PDMP Statment (Optional):26782} Consultants: *** Procedures performed: ***  Disposition: {Plan; Disposition:26390} Diet recommendation:  Discharge Diet Orders (From admission, onward)     Start     Ordered   04/09/23 0000  Diet - low sodium heart healthy        04/09/23 1026           {Diet_Plan:26776} DISCHARGE MEDICATION: Allergies as of 04/09/2023       Reactions   Codeine Other (See Comments)   REACTION: makes her nervous, orTylenol #3   Ibuprofen Other (See Comments)   REACTION: nervous   Meperidine Hcl Nausea And Vomiting   Naproxen Sodium Other (See Comments)   Or Advil REACTION: nervous   Tramadol Other (See Comments)   Insomnia    Tylenol With Codeine #3 [acetaminophen-codeine] Nausea And Vomiting        Medication List     STOP taking these medications    irbesartan 300 MG tablet Commonly known as: AVAPRO       TAKE these medications    acetaminophen 500 MG tablet Commonly known as: TYLENOL Take 1 tablet (500 mg total) by mouth every 6 (six) hours as needed. What changed: reasons to take this   amLODipine 5 MG tablet Commonly known as:  NORVASC Take 1 tablet by mouth daily   aspirin 81 MG chewable tablet Chew 1 tablet (81 mg total) by mouth daily.   atenolol 100 MG tablet Commonly known as: TENORMIN Take 1 tablet (100 mg total) by mouth daily.   atorvastatin 20 MG tablet Commonly known as: LIPITOR TAKE 1 TABLET(20 MG) BY MOUTH DAILY What changed: See the new instructions.   Azelastine HCl 137 MCG/SPRAY Soln Place into the nose at bedtime as needed.   CALCIUM 500+D PO Take 1 tablet by mouth daily.   calcium carbonate 1250 (500 Ca) MG tablet Commonly known as: OS-CAL - dosed in mg of elemental calcium Take 1 tablet (1,250 mg total) by mouth 3 (three) times daily with meals.   clopidogrel 75 MG tablet Commonly known as: PLAVIX Take 1 tablet (75 mg total) by mouth daily. What changed: when to take this   diazepam 5 MG tablet Commonly known as: VALIUM Take 1 tablet (5 mg total) by mouth every 6 (six) hours as needed for anxiety.   fluticasone 50 MCG/ACT nasal spray Commonly known as: FLONASE SHAKE LIQUID AND USE 2 SPRAYS IN EACH NOSTRIL DAILY What changed: See the new instructions.   FreeStyle Libre 3 Plus Sensor Misc 1 each by Does not apply route continuous. Change every 15 days.   Gvoke HypoPen 1-Pack 1 MG/0.2ML Soaj Generic drug: Glucagon Inject 1 mg into the skin as needed (low  blood sugar with impaired consciousness).   insulin NPH Human 100 UNIT/ML injection Commonly known as: NOVOLIN N Inject 8 units into the skin every morning and inject 5 units into the skin at evening meal. What changed: additional instructions   insulin regular 100 units/mL injection Commonly known as: NOVOLIN R Inject 0.06 mLs (6 Units total) into the skin 2 (two) times daily before a meal. What changed: how much to take   INSULIN SYRINGE .5CC/31GX5/16" 31G X 5/16" 0.5 ML Misc USE TO INJECT INSULIN 5 TIMES PER DAY   isosorbide mononitrate 30 MG 24 hr tablet Commonly known as: IMDUR TAKE 1 TABLET BY MOUTH EVERY  DAY   metFORMIN 500 MG tablet Commonly known as: GLUCOPHAGE TAKE 1 TABLET(500 MG) BY MOUTH DAILY WITH SUPPER What changed: See the new instructions.   mometasone 0.1 % cream Commonly known as: ELOCON Apply 1 Application topically daily as needed (itchy ears). Use as directed   multivitamin capsule Take 1 capsule by mouth daily.  Centrum   omeprazole 20 MG capsule Commonly known as: PRILOSEC TAKE 1 CAPSULE(20 MG) BY MOUTH DAILY What changed: See the new instructions.   ondansetron 4 MG tablet Commonly known as: Zofran Take 1 tablet (4 mg total) by mouth every 8 (eight) hours as needed for nausea or vomiting.   OneTouch Ultra test strip Generic drug: glucose blood USE AS DIRECTED THREE TIMES DAILY   Ozempic (0.25 or 0.5 MG/DOSE) 2 MG/3ML Sopn Generic drug: Semaglutide(0.25 or 0.5MG /DOS) Inject 0.5 mg into the skin every Monday.   vancomycin 125 MG capsule Commonly known as: VANCOCIN Take 1 capsule (125 mg total) by mouth 4 (four) times daily for 10 days.   Venclexta 100 MG tablet Generic drug: venetoclax Take 1 tablet (100 mg total) by mouth daily. Tablets should be swallowed whole with a meal and a full glass of water. What changed: when to take this   VITAMIN D PO Take 1 tablet by mouth daily.        Follow-up Information     Wanda Plump, MD. Schedule an appointment as soon as possible for a visit .   Specialty: Internal Medicine Contact information: [address] [place] [address] [phone]                Discharge Exam: Ceasar Mons Weights   04/07/23 1320  Weight: 78 kg   ***  Condition at discharge: {DC Condition:26389}  The results of significant diagnostics from this hospitalization (including imaging, microbiology, ancillary and laboratory) are listed below for reference.   Imaging Studies: ECHOCARDIOGRAM COMPLETE Result Date: 04/08/2023    ECHOCARDIOGRAM REPORT   Patient Name:   [name] Date of Exam:  04/08/2023 Medical Rec #:  [id number]          Height:       65.0 in Accession #:    [id number]         Weight:       172.0 lb Date of Birth:  24-Nov-1940           BSA:          1.855 m Patient Age:    82 years           BP:           137/64 mmHg Patient Gender: F                  HR:  68 bpm. Exam Location:  Inpatient Procedure: 2D Echo, Color Doppler and Cardiac Doppler (Both Spectral and Color            Flow Doppler were utilized during procedure). Indications:    Dyspnea  History:        Patient has prior history of Echocardiogram examinations, most                 recent 05/12/2019. CAD and Previous Myocardial Infarction,                 Signs/Symptoms:Chest Pain and Shortness of Breath; Risk                 Factors:Hypertension, Diabetes, GERD and Dyslipidemia.  Sonographer:    Raeford Razor RDCS Referring Phys: [phone] Sopheap Boehle A Chavy [facility] IMPRESSIONS  1. Left ventricular ejection fraction, by estimation, is 55 to 60%. The left ventricle has normal function. The left ventricle has no regional wall motion abnormalities. Left ventricular diastolic parameters are consistent with Grade I diastolic dysfunction (impaired relaxation).  2. Right ventricular systolic function is normal. The right ventricular size is normal.  3. Left atrial size was moderately dilated.  4. The mitral valve is degenerative. Trivial mitral valve regurgitation. No evidence of mitral stenosis. The mean mitral valve gradient is 3.0 mmHg. Severe mitral annular calcification.  5. The aortic valve is tricuspid. There is mild calcification of the aortic valve. Aortic valve regurgitation is not visualized. No aortic stenosis is present.  6. The inferior vena cava is normal in size with greater than 50% respiratory variability, suggesting right atrial pressure of 3 mmHg. FINDINGS  Left Ventricle: Left ventricular ejection fraction, by estimation, is 55 to 60%. The left ventricle has normal function. The left ventricle has no regional wall motion  abnormalities. The left ventricular internal cavity size was normal in size. There is  no left ventricular hypertrophy. Left ventricular diastolic parameters are consistent with Grade I diastolic dysfunction (impaired relaxation). Right Ventricle: The right ventricular size is normal. No increase in right ventricular wall thickness. Right ventricular systolic function is normal. Left Atrium: Left atrial size was moderately dilated. Right Atrium: Right atrial size was normal in size. Pericardium: There is no evidence of pericardial effusion. Mitral Valve: The mitral valve is degenerative in appearance. There is mild thickening of the mitral valve leaflet(s). Severe mitral annular calcification. Trivial mitral valve regurgitation. No evidence of mitral valve stenosis. MV peak gradient, 6.1 mmHg. The mean mitral valve gradient is 3.0 mmHg. Tricuspid Valve: The tricuspid valve is normal in structure. Tricuspid valve regurgitation is not demonstrated. No evidence of tricuspid stenosis. Aortic Valve: The aortic valve is tricuspid. There is mild calcification of the aortic valve. Aortic valve regurgitation is not visualized. No aortic stenosis is present. Aortic valve mean gradient measures 4.0 mmHg. Aortic valve peak gradient measures 7.8 mmHg. Aortic valve area, by VTI measures 2.03 cm. Pulmonic Valve: The pulmonic valve was normal in structure. Pulmonic valve regurgitation is not visualized. No evidence of pulmonic stenosis. Aorta: The aortic root is normal in size and structure. Venous: The inferior vena cava is normal in size with greater than 50% respiratory variability, suggesting right atrial pressure of 3 mmHg. IAS/Shunts: No atrial level shunt detected by color flow Doppler.  LEFT VENTRICLE PLAX 2D LVIDd:         4.90 cm     Diastology LVIDs:         3.40 cm     LV e' medial:  3.26 cm/s LV PW:         0.90 cm     LV E/e' medial:  35.6 LV IVS:        0.80 cm     LV e' lateral:   9.36 cm/s LVOT diam:     1.90 cm      LV E/e' lateral: 12.4 LV SV:         63 LV SV Index:   34 LVOT Area:     2.84 cm  LV Volumes (MOD) LV vol d, MOD A2C: 63.5 ml LV vol d, MOD A4C: 79.4 ml LV vol s, MOD A2C: 24.0 ml LV vol s, MOD A4C: 31.4 ml LV SV MOD A2C:     39.5 ml LV SV MOD A4C:     79.4 ml LV SV MOD BP:      45.6 ml RIGHT VENTRICLE            IVC RV Basal diam:  2.90 cm    IVC diam: 1.30 cm RV S prime:     7.29 cm/s TAPSE (M-mode): 1.1 cm LEFT ATRIUM             Index        RIGHT ATRIUM           Index LA diam:        4.50 cm 2.43 cm/m   RA Area:     17.50 cm LA Vol (A2C):   76.5 ml 41.24 ml/m  RA Volume:   40.30 ml  21.72 ml/m LA Vol (A4C):   77.6 ml 41.83 ml/m LA Biplane Vol: 77.8 ml 41.94 ml/m  AORTIC VALVE AV Area (Vmax):    2.01 cm AV Area (Vmean):   1.99 cm AV Area (VTI):     2.03 cm AV Vmax:           140.00 cm/s AV Vmean:          95.300 cm/s AV VTI:            0.311 m AV Peak Grad:      7.8 mmHg AV Mean Grad:      4.0 mmHg LVOT Vmax:         99.40 cm/s LVOT Vmean:        67.000 cm/s LVOT VTI:          0.223 m LVOT/AV VTI ratio: 0.72  AORTA Ao Root diam: 2.70 cm Ao Asc diam:  3.30 cm MITRAL VALVE MV Area (PHT): 3.48 cm     SHUNTS MV Area VTI:   1.49 cm     Systemic VTI:  0.22 m MV Peak grad:  6.1 mmHg     Systemic Diam: 1.90 cm MV Mean grad:  3.0 mmHg MV Vmax:       1.23 m/s MV Vmean:      74.9 cm/s MV Decel Time: 218 msec MV E velocity: 116.00 cm/s MV A velocity: 72.40 cm/s MV E/A ratio:  1.60 Donato Schultz MD Electronically signed by Donato Schultz MD Signature Date/Time: 04/08/2023/9:49:54 AM    Final    DG Chest Portable 1 View Result Date: 04/07/2023 CLINICAL DATA:  Shortness of breath. EXAM: PORTABLE CHEST 1 VIEW COMPARISON:  Chest radiograph dated 05/02/2019. FINDINGS: No focal consolidation, pleural effusion or pneumothorax. The cardiac silhouette is within normal limits. Median sternotomy wires and CABG vascular clips. No acute osseous pathology. IMPRESSION: No active disease. Electronically Signed   By: Elgie Collard M.D.   On:  04/07/2023 14:27    Microbiology: Results for orders placed or performed during the hospital encounter of 04/07/23  Resp panel by RT-PCR (RSV, Flu A&B, Covid) Anterior Nasal Swab     Status: None   Collection Time: 04/07/23  2:59 PM   Specimen: Anterior Nasal Swab  Result Value Ref Range Status   SARS Coronavirus 2 by RT PCR NEGATIVE NEGATIVE Final    Comment: (NOTE) SARS-CoV-2 target nucleic acids are NOT DETECTED.  The SARS-CoV-2 RNA is generally detectable in upper respiratory specimens during the acute phase of infection. The lowest concentration of SARS-CoV-2 viral copies this assay can detect is 138 copies/mL. A negative result does not preclude SARS-Cov-2 infection and should not be used as the sole basis for treatment or other patient management decisions. A negative result may occur with  improper specimen collection/handling, submission of specimen other than nasopharyngeal swab, presence of viral mutation(s) within the areas targeted by this assay, and inadequate number of viral copies(<138 copies/mL). A negative result must be combined with clinical observations, patient history, and epidemiological information. The expected result is Negative.  Fact Sheet for Patients:  BloggerCourse.com  Fact Sheet for Healthcare Providers:  SeriousBroker.it  This test is no t yet approved or cleared by the [place] FDA and  has been authorized for detection and/or diagnosis of SARS-CoV-2 by FDA under an Emergency Use Authorization (EUA). This EUA will remain  in effect (meaning this test can be used) for the duration of the COVID-19 declaration under Section 564(b)(1) of the Act, 21 U.S.C.section 360bbb-3(b)(1), unless the authorization is terminated  or revoked sooner.       Influenza A by PCR NEGATIVE NEGATIVE Final   Influenza B by PCR NEGATIVE NEGATIVE Final    Comment: (NOTE) The Xpert Xpress  SARS-CoV-2/FLU/RSV plus assay is intended as an aid in the diagnosis of influenza from Nasopharyngeal swab specimens and should not be used as a sole basis for treatment. Nasal washings and aspirates are unacceptable for Xpert Xpress SARS-CoV-2/FLU/RSV testing.  Fact Sheet for Patients: BloggerCourse.com  Fact Sheet for Healthcare Providers: SeriousBroker.it  This test is not yet approved or cleared by the [place] FDA and has been authorized for detection and/or diagnosis of SARS-CoV-2 by FDA under an Emergency Use Authorization (EUA). This EUA will remain in effect (meaning this test can be used) for the duration of the COVID-19 declaration under Section 564(b)(1) of the Act, 21 U.S.C. section 360bbb-3(b)(1), unless the authorization is terminated or revoked.     Resp Syncytial Virus by PCR NEGATIVE NEGATIVE Final    Comment: (NOTE) Fact Sheet for Patients: BloggerCourse.com  Fact Sheet for Healthcare Providers: SeriousBroker.it  This test is not yet approved or cleared by the [place] FDA and has been authorized for detection and/or diagnosis of SARS-CoV-2 by FDA under an Emergency Use Authorization (EUA). This EUA will remain in effect (meaning this test can be used) for the duration of the COVID-19 declaration under Section 564(b)(1) of the Act, 21 U.S.C. section 360bbb-3(b)(1), unless the authorization is terminated or revoked.  Performed at [facility], [address], [place], [place] [address]   Urine Culture (for pregnant, neutropenic or urologic patients or patients with an indwelling urinary catheter)     Status: Abnormal   Collection Time: 04/07/23  4:33 PM   Specimen: Urine, Clean Catch  Result Value Ref Range Status   Specimen Description   Final    URINE, CLEAN CATCH Performed at [facility], [address] [place], [place] [address]    Special Requests   Final    NONE Performed at [facility], [address] [place], [place] [address]    Culture (A)  Final    40,000 COLONIES/mL GROUP B STREP(S.AGALACTIAE)ISOLATED TESTING AGAINST S. AGALACTIAE NOT ROUTINELY PERFORMED DUE TO PREDICTABILITY OF AMP/PEN/VAN SUSCEPTIBILITY. Performed at [facility], [address], [place], [place] [address]    Report Status 04/08/2023 FINAL  Final  C Difficile Quick Screen w PCR reflex     Status: Abnormal   Collection Time: 04/08/23  3:36 PM   Specimen: STOOL  Result Value Ref Range Status   C Diff antigen POSITIVE (A) NEGATIVE Final   C Diff toxin NEGATIVE NEGATIVE Final   C Diff interpretation Results are indeterminate. See PCR results.  Final    Comment: Performed at [facility], [address], [place], [place] [address]  C. Diff by PCR, Reflexed     Status: Abnormal   Collection Time: 04/08/23  3:36 PM  Result Value Ref Range Status   Toxigenic C. Difficile by PCR POSITIVE (A) NEGATIVE Final    Comment: Positive for toxigenic C. difficile with little to no toxin production. Only treat if clinical presentation suggests symptomatic illness. Performed at [facility], [address], [place], [place] [address]    *Note: Due to a large number of results and/or encounters for the requested time period, some results have not been displayed. A complete set of results can be found in Results Review.    Labs: CBC: Recent Labs  Lab 04/07/23 1459 04/08/23 0532  WBC 6.0 4.0  NEUTROABS 3.4  --   HGB 11.1* 10.2*  HCT 33.9* 30.9*  MCV 101.2* 101.3*  PLT 176 143*   Basic Metabolic Panel: Recent Labs  Lab 04/07/23 1459 04/08/23 0532 04/09/23 0521  NA 137 140 143  K 4.2 3.5 4.0  CL 109 115* 117*  CO2 20* 17* 20*  GLUCOSE 103* 156* 112*  BUN 46* 31* 18  CREATININE 2.01* 1.29* 1.12*  CALCIUM 7.3* 6.7* 6.5*   Liver Function Tests: Recent Labs   Lab 04/07/23 1459 04/08/23 0532  AST 33 29  ALT 39 30  ALKPHOS 49 48  BILITOT 0.7 0.6  PROT 7.0 5.7*  ALBUMIN 3.5 3.0*   CBG: Recent Labs  Lab 04/08/23 0735 04/08/23 1144 04/08/23 1637 04/08/23 2013 04/09/23 0722  GLUCAP 152* 156* 172* 165* 113*    Discharge time spent: {LESS THAN/GREATER ZHYQ:65784} 30 minutes.  Signed: Alba Cory, MD [facility] 04/09/2023

## 2023-04-09 NOTE — Progress Notes (Signed)
 Discharge instructions reviewed with patient. All questions answered. All belongings accounted for. Patient to follow up with MD in  1 weeks. Patient medications for discharge to be picked up from retail pharmacy per patient request. PIV removed. Family enroute to transport home via private vehicle

## 2023-04-09 NOTE — Progress Notes (Signed)
   04/09/23 1055  TOC Brief Assessment  Insurance and Status Reviewed  Patient has primary care physician Yes  Home environment has been reviewed Single family home  Prior level of function: Independent  Prior/Current Home Services No current home services  Social Drivers of Health Review SDOH reviewed no interventions necessary  Readmission risk has been reviewed Yes  Transition of care needs no transition of care needs at this time

## 2023-04-09 NOTE — Discharge Summary (Signed)
 Physician Discharge Summary   Patient: [name] MRN: [id number] DOB: 09-Dec-1940  Admit date:     04/07/2023  Discharge date: 04/09/23  Discharge Physician: Alba Cory   PCP: Wanda Plump, MD   Recommendations at discharge:    Needs repeat Bmet to follow electrolytes and renal function to consider resumption ARB.  Follow up on resolution of C diff.   Discharge Diagnoses: Principal Problem:   Gastroenteritis  Resolved Problems:   * No resolved hospital problems. *  Hospital Course: 83 y.o. 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    Patient found to be in AKI with a creatinine of 2.0, previous creatinine 1.3  Assessment and Plan: 1-C diff colitis.  Patient present with nausea vomiting and diarrhea over the last week.  She reports the last time that she vomited was late Tuesday.   - GI pathogen and C. difficile antigen and PRC positive - tolerating diet.  -Treated  with IV fluids.  Diarrhea improved. Started on Oral Vancomycin 4/06.   AKI; Presented with a creatinine of 2.0, previous creatinine range 1.3 In the setting of hypovolemia, nausea vomiting diarrhea and ARB use - Continue  to hold ARB - Continue IV fluids - Strict I's and O's Renal function improved, but now has metabolic acidosis.    Metabolic acidosis;  Treated  with IV fluids.  Started oral Bicarb.    History of CAD, hypertension -Continue Lipitor, atenolol, Plavix - Will hold Avapro due to AKI. Hold Norvasc, BP soft - Continue Imdur     Chronic acute on chronic dyspnea on exertion - For worsening dyspnea, hemoglobin is stable, chest x-ray is normal - ECHO normal EF, no pulmonary HT>      History of CLL; Resume venetoclax when ok by oncology. Dr  Myna Hidalgo added to treatment team.    GERD:  On PPI   Diabetes type 2, hypoglycemia in the setting of poor oral intake, - Hold metformin while in the hospital - If renal function not improve might need to hold metformin at discharge - Monitor CBG, scale insulin   History of iron deficiency anemia: Hemoglobin stable, monitor   History of colon cancer, remote History of DCIS left breast cancer status postlumpectomy - Follow  up outpatient   Pyuria: Follow  urine culture Hypocalcemia: Started calcium supplementation.  calcium correction by albumin 7   Estimated body mass index is 28.62 kg/m as calculated from the following:   Height as of this encounter: 5\' 5"  (1.651 m).   Weight as of this encounter: 78 kg.        Consultants: None Procedures performed: None Disposition: Home Diet recommendation:  Discharge Diet Orders (From admission, onward)     Start     Ordered   04/09/23 0000  Diet - low sodium heart healthy        04/09/23 1026           Cardiac diet DISCHARGE MEDICATION: Allergies as of 04/09/2023       Reactions   Codeine Other (See Comments)   REACTION: makes her nervous, orTylenol #3   Ibuprofen Other (See Comments)   REACTION: nervous   Meperidine Hcl Nausea And Vomiting   Naproxen Sodium Other (See Comments)   Or Advil REACTION: nervous   Tramadol Other (See Comments)  Insomnia    Tylenol With Codeine #3 [acetaminophen-codeine] Nausea And Vomiting        Medication List     STOP taking these medications    irbesartan 300 MG tablet Commonly known as: AVAPRO       TAKE these medications    acetaminophen 500 MG tablet Commonly known as: TYLENOL Take 1 tablet (500 mg total) by mouth every 6 (six) hours as needed. What changed: reasons to take this   amLODipine 5 MG tablet Commonly known as: NORVASC Take 1 tablet by mouth daily   aspirin 81 MG chewable tablet Chew 1 tablet (81 mg total) by mouth daily.   atenolol 100 MG  tablet Commonly known as: TENORMIN Take 1 tablet (100 mg total) by mouth daily.   atorvastatin 20 MG tablet Commonly known as: LIPITOR TAKE 1 TABLET(20 MG) BY MOUTH DAILY What changed: See the new instructions.   Azelastine HCl 137 MCG/SPRAY Soln Place into the nose at bedtime as needed.   CALCIUM 500+D PO Take 1 tablet by mouth daily.   calcium carbonate 1250 (500 Ca) MG tablet Commonly known as: OS-CAL - dosed in mg of elemental calcium Take 1 tablet (1,250 mg total) by mouth 3 (three) times daily with meals.   clopidogrel 75 MG tablet Commonly known as: PLAVIX Take 1 tablet (75 mg total) by mouth daily. What changed: when to take this   diazepam 5 MG tablet Commonly known as: VALIUM Take 1 tablet (5 mg total) by mouth every 6 (six) hours as needed for anxiety.   fluticasone 50 MCG/ACT nasal spray Commonly known as: FLONASE SHAKE LIQUID AND USE 2 SPRAYS IN EACH NOSTRIL DAILY What changed: See the new instructions.   FreeStyle Libre 3 Plus Sensor Misc 1 each by Does not apply route continuous. Change every 15 days.   Gvoke HypoPen 1-Pack 1 MG/0.2ML Soaj Generic drug: Glucagon Inject 1 mg into the skin as needed (low blood sugar with impaired consciousness).   insulin NPH Human 100 UNIT/ML injection Commonly known as: NOVOLIN N Inject 8 units into the skin every morning and inject 5 units into the skin at evening meal. What changed: additional instructions Notes to patient: Hold if blood sugar less than 150   insulin regular 100 units/mL injection Commonly known as: NOVOLIN R Inject 0.06 mLs (6 Units total) into the skin 2 (two) times daily before a meal. What changed: how much to take Notes to patient: HOLD if blood sugar Less than 150   INSULIN SYRINGE .5CC/31GX5/16" 31G X 5/16" 0.5 ML Misc USE TO INJECT INSULIN 5 TIMES PER DAY   isosorbide mononitrate 30 MG 24 hr tablet Commonly known as: IMDUR TAKE 1 TABLET BY MOUTH EVERY DAY   metFORMIN 500 MG  tablet Commonly known as: GLUCOPHAGE TAKE 1 TABLET(500 MG) BY MOUTH DAILY WITH SUPPER What changed: See the new instructions.   mometasone 0.1 % cream Commonly known as: ELOCON Apply 1 Application topically daily as needed (itchy ears). Use as directed   multivitamin capsule Take 1 capsule by mouth daily.  Centrum   omeprazole 20 MG capsule Commonly known as: PRILOSEC TAKE 1 CAPSULE(20 MG) BY MOUTH DAILY What changed: See the new instructions.   ondansetron 4 MG tablet Commonly known as: Zofran Take 1 tablet (4 mg total) by mouth every 8 (eight) hours as needed for nausea or vomiting.   OneTouch Ultra test strip Generic drug: glucose blood USE AS DIRECTED THREE TIMES DAILY   Ozempic (0.25 or 0.5 MG/DOSE)  2 MG/3ML Sopn Generic drug: Semaglutide(0.25 or 0.5MG /DOS) Inject 0.5 mg into the skin every Monday.   vancomycin 125 MG capsule Commonly known as: VANCOCIN Take 1 capsule (125 mg total) by mouth 4 (four) times daily for 10 days.   Venclexta 100 MG tablet Generic drug: venetoclax Take 1 tablet (100 mg total) by mouth daily. Tablets should be swallowed whole with a meal and a full glass of water. What changed: when to take this   VITAMIN D PO Take 1 tablet by mouth daily.        Follow-up Information     Wanda Plump, MD. Schedule an appointment as soon as possible for a visit .   Specialty: Internal Medicine Contact information: [address] [place] [address] [phone]                Discharge Exam: Ceasar Mons Weights   04/07/23 1320  Weight: 78 kg   General; NAD  Condition at discharge: stable  The results of significant diagnostics from this hospitalization (including imaging, microbiology, ancillary and laboratory) are listed below for reference.   Imaging Studies: ECHOCARDIOGRAM COMPLETE Result Date: 04/08/2023    ECHOCARDIOGRAM REPORT   Patient Name:   RHETTA CLEEK Dipaola Date of Exam: 04/08/2023 Medical Rec #:  [id number]           Height:       65.0 in Accession #:    [id number]         Weight:       172.0 lb Date of Birth:  02/09/1940           BSA:          1.855 m Patient Age:    82 years           BP:           137/64 mmHg Patient Gender: F                  HR:           68 bpm. Exam Location:  Inpatient Procedure: 2D Echo, Color Doppler and Cardiac Doppler (Both Spectral and Color            Flow Doppler were utilized during procedure). Indications:    Dyspnea  History:        Patient has prior history of Echocardiogram examinations, most                 recent 05/12/2019. CAD and Previous Myocardial Infarction,                 Signs/Symptoms:Chest Pain and Shortness of Breath; Risk                 Factors:Hypertension, Diabetes, GERD and Dyslipidemia.  Sonographer:    Raeford Razor RDCS Referring Phys: [phone] Nesiah Jump A Valeda Corzine IMPRESSIONS  1. Left ventricular ejection fraction, by estimation, is 55 to 60%. The left ventricle has normal function. The left ventricle has no regional wall motion abnormalities. Left ventricular diastolic parameters are consistent with Grade I diastolic dysfunction (impaired relaxation).  2. Right ventricular systolic function is normal. The right ventricular size is normal.  3. Left atrial size was moderately dilated.  4. The mitral valve is degenerative. Trivial mitral valve regurgitation. No evidence of mitral stenosis. The mean mitral valve gradient is 3.0 mmHg. Severe mitral annular calcification.  5. The aortic valve is tricuspid. There is mild calcification of the aortic valve. Aortic valve  regurgitation is not visualized. No aortic stenosis is present.  6. The inferior vena cava is normal in size with greater than 50% respiratory variability, suggesting right atrial pressure of 3 mmHg. FINDINGS  Left Ventricle: Left ventricular ejection fraction, by estimation, is 55 to 60%. The left ventricle has normal function. The left ventricle has no regional wall motion abnormalities. The left ventricular  internal cavity size was normal in size. There is  no left ventricular hypertrophy. Left ventricular diastolic parameters are consistent with Grade I diastolic dysfunction (impaired relaxation). Right Ventricle: The right ventricular size is normal. No increase in right ventricular wall thickness. Right ventricular systolic function is normal. Left Atrium: Left atrial size was moderately dilated. Right Atrium: Right atrial size was normal in size. Pericardium: There is no evidence of pericardial effusion. Mitral Valve: The mitral valve is degenerative in appearance. There is mild thickening of the mitral valve leaflet(s). Severe mitral annular calcification. Trivial mitral valve regurgitation. No evidence of mitral valve stenosis. MV peak gradient, 6.1 mmHg. The mean mitral valve gradient is 3.0 mmHg. Tricuspid Valve: The tricuspid valve is normal in structure. Tricuspid valve regurgitation is not demonstrated. No evidence of tricuspid stenosis. Aortic Valve: The aortic valve is tricuspid. There is mild calcification of the aortic valve. Aortic valve regurgitation is not visualized. No aortic stenosis is present. Aortic valve mean gradient measures 4.0 mmHg. Aortic valve peak gradient measures 7.8 mmHg. Aortic valve area, by VTI measures 2.03 cm. Pulmonic Valve: The pulmonic valve was normal in structure. Pulmonic valve regurgitation is not visualized. No evidence of pulmonic stenosis. Aorta: The aortic root is normal in size and structure. Venous: The inferior vena cava is normal in size with greater than 50% respiratory variability, suggesting right atrial pressure of 3 mmHg. IAS/Shunts: No atrial level shunt detected by color flow Doppler.  LEFT VENTRICLE PLAX 2D LVIDd:         4.90 cm     Diastology LVIDs:         3.40 cm     LV e' medial:    3.26 cm/s LV PW:         0.90 cm     LV E/e' medial:  35.6 LV IVS:        0.80 cm     LV e' lateral:   9.36 cm/s LVOT diam:     1.90 cm     LV E/e' lateral: 12.4 LV SV:          63 LV SV Index:   34 LVOT Area:     2.84 cm  LV Volumes (MOD) LV vol d, MOD A2C: 63.5 ml LV vol d, MOD A4C: 79.4 ml LV vol s, MOD A2C: 24.0 ml LV vol s, MOD A4C: 31.4 ml LV SV MOD A2C:     39.5 ml LV SV MOD A4C:     79.4 ml LV SV MOD BP:      45.6 ml RIGHT VENTRICLE            IVC RV Basal diam:  2.90 cm    IVC diam: 1.30 cm RV S prime:     7.29 cm/s TAPSE (M-mode): 1.1 cm LEFT ATRIUM             Index        RIGHT ATRIUM           Index LA diam:        4.50 cm 2.43 cm/m   RA Area:  17.50 cm LA Vol (A2C):   76.5 ml 41.24 ml/m  RA Volume:   40.30 ml  21.72 ml/m LA Vol (A4C):   77.6 ml 41.83 ml/m LA Biplane Vol: 77.8 ml 41.94 ml/m  AORTIC VALVE AV Area (Vmax):    2.01 cm AV Area (Vmean):   1.99 cm AV Area (VTI):     2.03 cm AV Vmax:           140.00 cm/s AV Vmean:          95.300 cm/s AV VTI:            0.311 m AV Peak Grad:      7.8 mmHg AV Mean Grad:      4.0 mmHg LVOT Vmax:         99.40 cm/s LVOT Vmean:        67.000 cm/s LVOT VTI:          0.223 m LVOT/AV VTI ratio: 0.72  AORTA Ao Root diam: 2.70 cm Ao Asc diam:  3.30 cm MITRAL VALVE MV Area (PHT): 3.48 cm     SHUNTS MV Area VTI:   1.49 cm     Systemic VTI:  0.22 m MV Peak grad:  6.1 mmHg     Systemic Diam: 1.90 cm MV Mean grad:  3.0 mmHg MV Vmax:       1.23 m/s MV Vmean:      74.9 cm/s MV Decel Time: 218 msec MV E velocity: 116.00 cm/s MV A velocity: 72.40 cm/s MV E/A ratio:  1.60 Donato Schultz MD Electronically signed by Donato Schultz MD Signature Date/Time: 04/08/2023/9:49:54 AM    Final    DG Chest Portable 1 View Result Date: 04/07/2023 CLINICAL DATA:  Shortness of breath. EXAM: PORTABLE CHEST 1 VIEW COMPARISON:  Chest radiograph dated 05/02/2019. FINDINGS: No focal consolidation, pleural effusion or pneumothorax. The cardiac silhouette is within normal limits. Median sternotomy wires and CABG vascular clips. No acute osseous pathology. IMPRESSION: No active disease. Electronically Signed   By: Elgie Collard M.D.   On: 04/07/2023 14:27     Microbiology: Results for orders placed or performed during the hospital encounter of 04/07/23  Resp panel by RT-PCR (RSV, Flu A&B, Covid) Anterior Nasal Swab     Status: None   Collection Time: 04/07/23  2:59 PM   Specimen: Anterior Nasal Swab  Result Value Ref Range Status   SARS Coronavirus 2 by RT PCR NEGATIVE NEGATIVE Final    Comment: (NOTE) SARS-CoV-2 target nucleic acids are NOT DETECTED.  The SARS-CoV-2 RNA is generally detectable in upper respiratory specimens during the acute phase of infection. The lowest concentration of SARS-CoV-2 viral copies this assay can detect is 138 copies/mL. A negative result does not preclude SARS-Cov-2 infection and should not be used as the sole basis for treatment or other patient management decisions. A negative result may occur with  improper specimen collection/handling, submission of specimen other than nasopharyngeal swab, presence of viral mutation(s) within the areas targeted by this assay, and inadequate number of viral copies(<138 copies/mL). A negative result must be combined with clinical observations, patient history, and epidemiological information. The expected result is Negative.  Fact Sheet for Patients:  BloggerCourse.com  Fact Sheet for Healthcare Providers:  SeriousBroker.it  This test is no t yet approved or cleared by the [place] FDA and  has been authorized for detection and/or diagnosis of SARS-CoV-2 by FDA under an Emergency Use Authorization (EUA). This EUA will remain  in effect (meaning this  test can be used) for the duration of the COVID-19 declaration under Section 564(b)(1) of the Act, 21 U.S.C.section 360bbb-3(b)(1), unless the authorization is terminated  or revoked sooner.       Influenza A by PCR NEGATIVE NEGATIVE Final   Influenza B by PCR NEGATIVE NEGATIVE Final    Comment: (NOTE) The Xpert Xpress SARS-CoV-2/FLU/RSV plus assay is  intended as an aid in the diagnosis of influenza from Nasopharyngeal swab specimens and should not be used as a sole basis for treatment. Nasal washings and aspirates are unacceptable for Xpert Xpress SARS-CoV-2/FLU/RSV testing.  Fact Sheet for Patients: BloggerCourse.com  Fact Sheet for Healthcare Providers: SeriousBroker.it  This test is not yet approved or cleared by the [place] FDA and has been authorized for detection and/or diagnosis of SARS-CoV-2 by FDA under an Emergency Use Authorization (EUA). This EUA will remain in effect (meaning this test can be used) for the duration of the COVID-19 declaration under Section 564(b)(1) of the Act, 21 U.S.C. section 360bbb-3(b)(1), unless the authorization is terminated or revoked.     Resp Syncytial Virus by PCR NEGATIVE NEGATIVE Final    Comment: (NOTE) Fact Sheet for Patients: BloggerCourse.com  Fact Sheet for Healthcare Providers: SeriousBroker.it  This test is not yet approved or cleared by the [place] FDA and has been authorized for detection and/or diagnosis of SARS-CoV-2 by FDA under an Emergency Use Authorization (EUA). This EUA will remain in effect (meaning this test can be used) for the duration of the COVID-19 declaration under Section 564(b)(1) of the Act, 21 U.S.C. section 360bbb-3(b)(1), unless the authorization is terminated or revoked.  Performed at [facility], [address], [place], [place] [address]   Urine Culture (for pregnant, neutropenic or urologic patients or patients with an indwelling urinary catheter)     Status: Abnormal   Collection Time: 04/07/23  4:33 PM   Specimen: Urine, Clean Catch  Result Value Ref Range Status   Specimen Description   Final    URINE, CLEAN CATCH Performed at [facility], [address], [place], [place] [address]     Special Requests   Final    NONE Performed at [facility], [address], [place], [place] [address]    Culture (A)  Final    40,000 COLONIES/mL GROUP B STREP(S.AGALACTIAE)ISOLATED TESTING AGAINST S. AGALACTIAE NOT ROUTINELY PERFORMED DUE TO PREDICTABILITY OF AMP/PEN/VAN SUSCEPTIBILITY. Performed at [facility], [address] [place], [place] [address]    Report Status 04/08/2023 FINAL  Final  C Difficile Quick Screen w PCR reflex     Status: Abnormal   Collection Time: 04/08/23  3:36 PM   Specimen: STOOL  Result Value Ref Range Status   C Diff antigen POSITIVE (A) NEGATIVE Final   C Diff toxin NEGATIVE NEGATIVE Final   C Diff interpretation Results are indeterminate. See PCR results.  Final    Comment: Performed at [facility], [address], [place], [place] [address]  C. Diff by PCR, Reflexed     Status: Abnormal   Collection Time: 04/08/23  3:36 PM  Result Value Ref Range Status   Toxigenic C. Difficile by PCR POSITIVE (A) NEGATIVE Final    Comment: Positive for toxigenic C. difficile with little to no toxin production. Only treat if clinical presentation suggests symptomatic illness. Performed at [facility], [address], [place], [place] [address]    *Note: Due to a large number of results and/or  encounters for the requested time period, some results have not been displayed. A complete set of results can be found in Results Review.    Labs: CBC: Recent Labs  Lab 04/07/23 1459 04/08/23 0532  WBC 6.0 4.0  NEUTROABS 3.4  --   HGB 11.1* 10.2*  HCT 33.9* 30.9*  MCV 101.2* 101.3*  PLT 176 143*   Basic Metabolic Panel: Recent Labs  Lab 04/07/23 1459 04/08/23 0532 04/09/23 0521  NA 137 140 143  K 4.2 3.5 4.0  CL 109 115* 117*  CO2 20* 17* 20*  GLUCOSE 103* 156* 112*  BUN 46* 31* 18  CREATININE 2.01* 1.29* 1.12*  CALCIUM 7.3* 6.7* 6.5*   Liver Function Tests: Recent Labs  Lab 04/07/23 1459  04/08/23 0532  AST 33 29  ALT 39 30  ALKPHOS 49 48  BILITOT 0.7 0.6  PROT 7.0 5.7*  ALBUMIN 3.5 3.0*   CBG: Recent Labs  Lab 04/08/23 0735 04/08/23 1144 04/08/23 1637 04/08/23 2013 04/09/23 0722  GLUCAP 152* 156* 172* 165* 113*    Discharge time spent: greater than 30 minutes.  Signed: Alba Cory, MD [facility] 04/09/2023

## 2023-04-10 ENCOUNTER — Telehealth: Payer: Self-pay | Admitting: *Deleted

## 2023-04-10 NOTE — Transitions of Care (Post Inpatient/ED Visit) (Signed)
 04/10/2023  Name: [name] MRN: [id number] DOB: July 24, 1940  Today's TOC FU Call Status: Today's TOC FU Call Status:: Successful TOC FU Call Completed TOC FU Call Complete Date: 04/10/23 Patient's Name and Date of Birth confirmed.  Transition Care Management Follow-up Telephone Call Date of Discharge: 04/09/23 Discharge Facility: [facility]) Type of Discharge: Inpatient Admission Primary Inpatient Discharge Diagnosis:: N/ V/ D- gastroenteritis How have you been since you were released from the hospital?: Better  "I am fine, back to my normal self; independent; I live by myself, take care of myself; not having any problems; taking the antibiotics like they told me to and the calcium. No questions about my regular medicines- there are too many to review today"  Any questions or concerns?: No  Items Reviewed: Did you receive and understand the discharge instructions provided?: Yes (thoroughly reviewed with patient who verbalizes good understanding of same) Medications obtained,verified, and reconciled?: Partial Review Completed (Partial medication review completed; no concerns or discrepancies identified; confirmed patient obtained/ is taking all newly Rx'd medications as instructed; self-manages medications and denies questions/ concerns around medications today) Reason for Partial Mediation Review: Patient adamantly declined medication review- reviewed changes and new medications post-hospital discharge, per patient request/ agreement Any new allergies since your discharge?: No Dietary orders reviewed?: Yes Type of Diet Ordered:: "Diabetic normally, but conservative after this gastroenteritis I had" Do you have support at home?: Yes People in Home [RPT]: alone Name of Support/Comfort Primary Source: Reports independent in self-care activities; suportive local son assists as/ if needed/ indicated  Medications Reviewed Today: Medications Reviewed Today     Reviewed by Michaela Corner, RN (Registered Nurse) on 04/10/23 at 1429  Med List Status: <None>   Medication Order Taking? Sig Documenting Provider Last Dose Status Informant  acetaminophen (TYLENOL) 500 MG tablet [id number]  Take 1 tablet (500 mg total) by mouth every 6 (six) hours as needed.  Patient taking differently: Take 500 mg by mouth every 6 (six) hours as needed for mild pain (pain score 1-3) or moderate pain (pain score 4-6).   Redwine, Gabriel Cirri, PA-C  Active Self, Pharmacy Records           Med Note Azucena Fallen   QMV Apr 07, 2023  6:02 PM) Patient stated she takes this almost every night  amLODipine (NORVASC) 5 MG tablet [id number]  Take 1 tablet by mouth daily Thapa, [place], MD  Active Self, Pharmacy Records           Med Note Azucena Fallen   Sat Apr 07, 2023  5:26 PM) LF: 02/20/23 for a 90ds  aspirin 81 MG chewable tablet [id number] Yes Chew 1 tablet (81 mg total) by mouth daily. Wanda Plump, MD Taking Active Self, Pharmacy Records  atenolol (TENORMIN) 100 MG tablet [id number]  Take 1 tablet (100 mg total) by mouth daily. Wanda Plump, MD  Active Self, Pharmacy Records           Med Note Azucena Fallen   YQI Apr 07, 2023  5:28 PM) LF: 02/19/23 for a 90ds  atorvastatin (LIPITOR) 20 MG tablet [id number]  TAKE 1 TABLET(20 MG) BY MOUTH DAILY  Patient taking differently: Take 20 mg by mouth daily.   Runell Gess, MD  Active Self, Pharmacy Records           Med Note Azucena Fallen   Sat Apr 07, 2023  5:21 PM) LF: 03/22/23 for a 90ds  Azelastine HCl  137 MCG/SPRAY SOLN [id number]  Place into the nose at bedtime as needed. [provider]  Active Self, Pharmacy Records  Calcium Carb-Cholecalciferol (CALCIUM 500+D PO) [id number] No Take 1 tablet by mouth daily.  Patient not taking: Reported on 04/10/2023   [provider] Not Taking Active Self, Pharmacy Records           Med Note Michaela Corner   Tue Apr 10, 2023  2:23 PM) 04/10/23: reports during [facility] call, she has  not taken this "for quite awhile"   calcium carbonate (OS-CAL - DOSED IN MG OF ELEMENTAL CALCIUM) 1250 (500 Ca) MG tablet [id number] Yes Take 1 tablet (1,250 mg total) by mouth 3 (three) times daily with meals. Regalado, Belkys A, MD Taking Active   clopidogrel (PLAVIX) 75 MG tablet [id number]  Take 1 tablet (75 mg total) by mouth daily.  Patient taking differently: Take 75 mg by mouth at bedtime.   Wanda Plump, MD  Active Self, Pharmacy Records           Med Note Azucena Fallen   VQQ Apr 07, 2023  5:28 PM) LF: 01/25/23 for a 90ds  Continuous Glucose Sensor (FREESTYLE LIBRE 3 PLUS SENSOR) [place] [id number]  1 each by Does not apply route continuous. Change every 15 days. Thapa, [place], MD  Active Self, Pharmacy Records  denosumab [facility] injection 60 mg [id number]   Wanda Plump, MD  Active   diazepam (VALIUM) 5 MG tablet [id number]  Take 1 tablet (5 mg total) by mouth every 6 (six) hours as needed for anxiety. Josph Macho, MD  Active Self, Pharmacy Records           Med Note Azucena Fallen   Sat Apr 07, 2023  6:23 PM) Patient unsure if she has more  fluticasone (FLONASE) 50 MCG/ACT nasal spray [id number]  SHAKE LIQUID AND USE 2 SPRAYS IN EACH NOSTRIL DAILY  Patient taking differently: Place 2 sprays into both nostrils as needed for allergies.   Wanda Plump, MD  Active Self, Pharmacy Records  Glucagon (GVOKE HYPOPEN 1-PACK) 1 MG/0.2ML Ivory Broad [id number]  Inject 1 mg into the skin as needed (low blood sugar with impaired consciousness). Thapa, [place], MD  Active Self, Pharmacy Records           Med Note Azucena Fallen   Sat Apr 07, 2023  5:30 PM) LF: 10/17/22 for a 90ds  glucose blood (ONETOUCH ULTRA) test strip [id number]  USE AS DIRECTED THREE TIMES DAILY Reather Littler, MD  Active Self, Pharmacy Records  insulin NPH Human (NOVOLIN N) 100 UNIT/ML injection [id number]  Inject 8 units into the skin every morning and inject 5 units into the skin at evening meal. Regalado, Belkys A, MD  Active    insulin regular (NOVOLIN R) 100 units/mL injection [id number]  Inject 0.06 mLs (6 Units total) into the skin 2 (two) times daily before a meal.  Patient taking differently: Inject 5 Units into the skin 2 (two) times daily before a meal.   Thapa, [place], MD  Active Self, Pharmacy Records  Insulin Syringe-Needle U-100 (INSULIN SYRINGE .5CC/31GX5/16") 31G X 5/16" 0.5 ML MISC [id number]  USE TO INJECT INSULIN 5 TIMES PER DAY Reather Littler, MD  Active Self, Pharmacy Records  isosorbide mononitrate (IMDUR) 30 MG 24 hr tablet [id number]  TAKE 1 TABLET BY MOUTH EVERY DAY Runell Gess, MD  Active Self, Pharmacy Records  metFORMIN (GLUCOPHAGE) 500 MG tablet [id number]  TAKE 1 TABLET(500 MG) BY MOUTH  DAILY WITH SUPPER  Patient taking differently: Take 500 mg by mouth at bedtime.   Thapa, [place], MD  Active Self, Pharmacy Records           Med Note [facility], TYELISHA L   Sat Apr 07, 2023  5:22 PM) LF: 03/21/23 for a 90ds  mometasone (ELOCON) 0.1 % cream [id number]  Apply 1 Application topically daily as needed (itchy ears). Use as directed [provider]  Active Self, Pharmacy Records  Multiple Vitamin (MULTIVITAMIN) capsule [id number] Yes Take 1 capsule by mouth daily.  Centrum [provider] Taking Active Self, Pharmacy Records  omeprazole (PRILOSEC) 20 MG capsule [id number]  TAKE 1 CAPSULE(20 MG) BY MOUTH DAILY  Patient taking differently: Take 20 mg by mouth daily.   Josph Macho, MD  Active Self, Pharmacy Records           Med Note [facility], TYELISHA L   Sat Apr 07, 2023  5:25 PM) LF: 03/01/23 for a 90ds  ondansetron (ZOFRAN) 4 MG tablet [id number]  Take 1 tablet (4 mg total) by mouth every 8 (eight) hours as needed for nausea or vomiting. Harriette Bouillon, MD  Active Self, Pharmacy Records           Med Note Azucena Fallen   Sat Apr 07, 2023  5:33 PM) LF: 05/02/22 for a 6ds  Semaglutide,0.25 or 0.5MG /DOS, (OZEMPIC, 0.25 OR 0.5 MG/DOSE,) 2 MG/3ML SOPN [id number]  Inject 0.5 mg into  the skin every Monday. Thapa, [place], MD  Active Self, Pharmacy Records           Med Note Azucena Fallen   LKG Apr 07, 2023  5:15 PM) LF: 04/06/23 for a 84ds  vancomycin (VANCOCIN) 125 MG capsule [id number] Yes Take 1 capsule (125 mg total) by mouth 4 (four) times daily for 10 days. Regalado, Belkys A, MD Taking Active   venetoclax (VENCLEXTA) 100 MG tablet [id number]  Take 1 tablet (100 mg total) by mouth daily. Tablets should be swallowed whole with a meal and a full glass of water.  Patient taking differently: Take 100 mg by mouth in the morning. Tablets should be swallowed whole with a meal and a full glass of water.   Josph Macho, MD  Active Self, Pharmacy Records           Med Note Azucena Fallen   QVZ Apr 07, 2023  5:24 PM) LF: 03/21/23 for a 28ds  VITAMIN D PO [id number] Yes Take 1 tablet by mouth daily. [provider] Taking Active Self, Pharmacy Records  Med List Note Lorelee New, CPhT 04/12/21 1005): Calquence AND Venclexta filled at [facility]           [facility] and Equipment/Supplies: Were Home Health Services Ordered?: No Any new equipment or medical supplies ordered?: No  Functional Questionnaire: Do you need assistance with bathing/showering or dressing?: No Do you need assistance with meal preparation?: No Do you need assistance with eating?: No Do you have difficulty maintaining continence: No Do you need assistance with getting out of bed/getting out of a chair/moving?: No Do you have difficulty managing or taking your medications?: No  Follow up appointments reviewed: PCP Follow-up appointment confirmed?: Yes Date of PCP follow-up appointment?: 04/17/23 Follow-up Provider: PCP Specialist Hospital Follow-up appointment confirmed?: NA (verified not indicated per hospital discharging provider discharge notes) Do you need transportation to your follow-up appointment?: No Do you understand care options if your condition(s)  worsen?: Yes-patient verbalized understanding  SDOH Interventions Today    Flowsheet Row Most Recent Value  SDOH Interventions   Food Insecurity Interventions Intervention Not Indicated  Housing Interventions Intervention Not Indicated  Transportation Interventions Intervention Not Indicated  [drives self]  Utilities Interventions Intervention Not Indicated      Total time spent from review to signing of note/ including any care coordination interventions:  50 minutes- very talkative/ friendly patient  Pls call/ message for questions,  Caryl Pina, RN, BSN, Media planner  Transitions of Care  [facility] - [facility] [phone]: direct office

## 2023-04-12 ENCOUNTER — Other Ambulatory Visit: Payer: Self-pay

## 2023-04-12 ENCOUNTER — Other Ambulatory Visit: Payer: Self-pay | Admitting: Pharmacy Technician

## 2023-04-12 NOTE — Progress Notes (Signed)
 Specialty Pharmacy Refill Coordination Note  [name] is a 83 y.o. female contacted today regarding refills of specialty medication(s) Venetoclax [name])   Patient requested Delivery   Delivery date: 04/20/23   Verified address: [address] [name] [address]   Medication will be filled on 04/19/23.

## 2023-04-13 ENCOUNTER — Ambulatory Visit: Admitting: Physician Assistant

## 2023-04-13 ENCOUNTER — Ambulatory Visit: Payer: Self-pay

## 2023-04-13 ENCOUNTER — Encounter: Payer: Self-pay | Admitting: Physician Assistant

## 2023-04-13 VITALS — BP 146/75 | HR 70 | Temp 97.7°F | Ht 65.0 in | Wt 168.0 lb

## 2023-04-13 DIAGNOSIS — R319 Hematuria, unspecified: Secondary | ICD-10-CM | POA: Diagnosis not present

## 2023-04-13 DIAGNOSIS — R109 Unspecified abdominal pain: Secondary | ICD-10-CM | POA: Diagnosis not present

## 2023-04-13 DIAGNOSIS — K625 Hemorrhage of anus and rectum: Secondary | ICD-10-CM

## 2023-04-13 DIAGNOSIS — N179 Acute kidney failure, unspecified: Secondary | ICD-10-CM | POA: Diagnosis not present

## 2023-04-13 DIAGNOSIS — A0472 Enterocolitis due to Clostridium difficile, not specified as recurrent: Secondary | ICD-10-CM | POA: Diagnosis not present

## 2023-04-13 LAB — POCT URINALYSIS DIP (MANUAL ENTRY)
Glucose, UA: NEGATIVE mg/dL
Ketones, POC UA: NEGATIVE mg/dL
Nitrite, UA: NEGATIVE
Protein Ur, POC: >=300 mg/dL — AB
Spec Grav, UA: 1.025 (ref 1.010–1.025)
Urobilinogen, UA: 0.2 U/dL
pH, UA: 5 (ref 5.0–8.0)

## 2023-04-13 NOTE — Progress Notes (Signed)
 Established patient visit   Patient: [name]   DOB: 1940-10-02   83 y.o. Female  MRN: [id number] Visit Date: 04/13/2023  Today's healthcare provider: Alfredia Ferguson, PA-C   Cc. Hospital follow up  Subjective     Pt was hospitalized 4/5-4/7 for N/V/D, found to have Cdiff and started on oral vancomycin.  Had an AKI/ metabolic acidosis. ARB was held. Irbesartan 300  mg.  Discussed the use of AI scribe software for clinical note transcription with the patient, who gave verbal consent to proceed.  History of Present Illness   The patient, with a history of cancer and chronic anemia, presents with persistent diarrhea and new onset of flank discomfort on the right side. The patient was recently hospitalized due to a C. diff infection and has been on medication for the same. Despite the treatment, the patient reports that the diarrhea has not resolved and has noticed some blood in the stool.       Medications: Outpatient Medications Prior to Visit  Medication Sig   acetaminophen (TYLENOL) 500 MG tablet Take 1 tablet (500 mg total) by mouth every 6 (six) hours as needed. (Patient taking differently: Take 500 mg by mouth every 6 (six) hours as needed for mild pain (pain score 1-3) or moderate pain (pain score 4-6).)   amLODipine (NORVASC) 5 MG tablet Take 1 tablet by mouth daily   aspirin 81 MG chewable tablet Chew 1 tablet (81 mg total) by mouth daily.   atenolol (TENORMIN) 100 MG tablet Take 1 tablet (100 mg total) by mouth daily.   atorvastatin (LIPITOR) 20 MG tablet TAKE 1 TABLET(20 MG) BY MOUTH DAILY (Patient taking differently: Take 20 mg by mouth daily.)   Azelastine HCl 137 MCG/SPRAY SOLN Place into the nose at bedtime as needed.   Calcium Carb-Cholecalciferol (CALCIUM 500+D PO) Take 1 tablet by mouth daily. (Patient not taking: Reported on 04/10/2023)   calcium carbonate (OS-CAL - DOSED IN MG OF ELEMENTAL CALCIUM) 1250 (500 Ca) MG tablet Take 1 tablet (1,250 mg total) by  mouth 3 (three) times daily with meals.   clopidogrel (PLAVIX) 75 MG tablet Take 1 tablet (75 mg total) by mouth daily. (Patient taking differently: Take 75 mg by mouth at bedtime.)   Continuous Glucose Sensor (FREESTYLE LIBRE 3 PLUS SENSOR) MISC 1 each by Does not apply route continuous. Change every 15 days.   diazepam (VALIUM) 5 MG tablet Take 1 tablet (5 mg total) by mouth every 6 (six) hours as needed for anxiety.   fluticasone (FLONASE) 50 MCG/ACT nasal spray SHAKE LIQUID AND USE 2 SPRAYS IN EACH NOSTRIL DAILY (Patient taking differently: Place 2 sprays into both nostrils as needed for allergies.)   Glucagon (GVOKE HYPOPEN 1-PACK) 1 MG/0.2ML SOAJ Inject 1 mg into the skin as needed (low blood sugar with impaired consciousness).   glucose blood (ONETOUCH ULTRA) test strip USE AS DIRECTED THREE TIMES DAILY   insulin NPH Human (NOVOLIN N) 100 UNIT/ML injection Inject 8 units into the skin every morning and inject 5 units into the skin at evening meal.   insulin regular (NOVOLIN R) 100 units/mL injection Inject 0.06 mLs (6 Units total) into the skin 2 (two) times daily before a meal. (Patient taking differently: Inject 5 Units into the skin 2 (two) times daily before a meal.)   Insulin Syringe-Needle U-100 (INSULIN SYRINGE .5CC/31GX5/16") 31G X 5/16" 0.5 ML MISC USE TO INJECT INSULIN 5 TIMES PER DAY   isosorbide mononitrate (IMDUR) 30 MG  24 hr tablet TAKE 1 TABLET BY MOUTH EVERY DAY   metFORMIN (GLUCOPHAGE) 500 MG tablet TAKE 1 TABLET(500 MG) BY MOUTH DAILY WITH SUPPER (Patient taking differently: Take 500 mg by mouth at bedtime.)   mometasone (ELOCON) 0.1 % cream Apply 1 Application topically daily as needed (itchy ears). Use as directed   Multiple Vitamin (MULTIVITAMIN) capsule Take 1 capsule by mouth daily.  Centrum   omeprazole (PRILOSEC) 20 MG capsule TAKE 1 CAPSULE(20 MG) BY MOUTH DAILY (Patient taking differently: Take 20 mg by mouth daily.)   ondansetron (ZOFRAN) 4 MG tablet Take 1 tablet (4  mg total) by mouth every 8 (eight) hours as needed for nausea or vomiting.   Semaglutide,0.25 or 0.5MG /DOS, (OZEMPIC, 0.25 OR 0.5 MG/DOSE,) 2 MG/3ML SOPN Inject 0.5 mg into the skin every Monday.   vancomycin (VANCOCIN) 125 MG capsule Take 1 capsule (125 mg total) by mouth 4 (four) times daily for 10 days.   venetoclax (VENCLEXTA) 100 MG tablet Take 1 tablet (100 mg total) by mouth daily. Tablets should be swallowed whole with a meal and a full glass of water. (Patient taking differently: Take 100 mg by mouth in the morning. Tablets should be swallowed whole with a meal and a full glass of water.)   VITAMIN D PO Take 1 tablet by mouth daily.   Facility-Administered Medications Prior to Visit  Medication Dose Route Frequency Provider   [START ON 06/18/2023] denosumab (PROLIA) injection 60 mg  60 mg Subcutaneous Once Willow Ora E, MD   vancomycin (VANCOCIN) 1,000 mg in sodium chloride 0.9 % 500 mL IVPB  1,000 mg Intravenous Once Cornett, Thomas, MD    Review of Systems  Constitutional:  Negative for fatigue and fever.  Respiratory:  Negative for cough and shortness of breath.   Cardiovascular:  Negative for chest pain and leg swelling.  Gastrointestinal:  Positive for blood in stool and diarrhea. Negative for abdominal pain.  Neurological:  Negative for dizziness and headaches.       Objective    BP (!) 146/75   Pulse 70   Temp 97.7 F (36.5 C)   Ht 5\' 5"  (1.651 m)   Wt 168 lb (76.2 kg)   SpO2 100%   BMI 27.96 kg/m    Physical Exam Constitutional:      General: She is awake.     Appearance: She is well-developed.  HENT:     Head: Normocephalic.  Eyes:     Conjunctiva/sclera: Conjunctivae normal.  Cardiovascular:     Rate and Rhythm: Normal rate and regular rhythm.     Heart sounds: Normal heart sounds.  Pulmonary:     Effort: Pulmonary effort is normal.     Breath sounds: Normal breath sounds.  Musculoskeletal:     Comments: No tenderness, no rashes in area of pain   Skin:    General: Skin is warm.  Neurological:     Mental Status: She is alert and oriented to person, place, and time.  Psychiatric:        Attention and Perception: Attention normal.        Mood and Affect: Mood normal.        Speech: Speech normal.        Behavior: Behavior is cooperative.      No results found for any visits on 04/13/23.  Assessment & Plan    AKI (acute kidney injury) (HCC) -     Basic metabolic panel with GFR  C. difficile diarrhea -  Ambulatory referral to Gastroenterology  Right flank pain -     POCT urinalysis dipstick  Rectal bleeding -     CBC with Differential/Platelet -     Ambulatory referral to Gastroenterology  Hematuria, unspecified type -     Urine Culture   Repeat bmp, cbc. Hold arb until labs back.  Referring to GI for rectal bleeding/ cdiff. Pt will monitor at home, if symptoms worsen she will go to ED.  Right flank pain seems musculoskeletal -- recommending heating pad, stretching. Checked a  UA ---with leuks, blood. Will run culture and urine micro  Return if symptoms worsen or fail to improve.       Alfredia Ferguson, PA-C  [facility] at [facility] [phone] (phone) [phone] (fax)  [facility]

## 2023-04-13 NOTE — Telephone Encounter (Signed)
 Chief Complaint: Rectal bleeding  Symptoms: blood in stool, diarrhea, mild right flank pain Frequency: constant onset today  Pertinent Negatives: Patient denies abdominal pain, nausea, vomiting Disposition: [] ED /[] Urgent Care (no appt availability in office) / [x] Appointment(In office/virtual)/ []  [facility]/ [] Home Care/ [] Refused Recommended Disposition /[]  Mobile Bus/ []  Follow-up with PCP Additional Notes: Patient states she was seen in the ED on 04/07/23 and was diagnosed with C-diff. Patient reports waking up at 0230 and noticed bright red blood. Patient stated she and 2 more bowel movements that felt like straight liquid and when checking the toilet it seemed to be only blood and very little stool. Patient reports she takes Plavix and a bay aspirin. Care advice was given and patient has been scheduled for an appointment today at 1500. Patient states she will have her son or daughter in law drive her to the appointment.  Copied from CRM [id number]. Topic: Clinical - Red Word Triage >> Apr 13, 2023  8:46 AM Florestine Avers wrote: Red Word that prompted transfer to Nurse Triage: Patient called in stating she was diagnosed with CDiff, she said at 230am this morning she woke up to use the restroom and had blood in her stool. Patient is highly concerned, she said the blood is a lot and bright red, she is feeling extremely fatigued. She states she soaked through a pad, the blood was all over her pajamas. Reason for Disposition  MODERATE rectal bleeding (small blood clots, passing blood without stool, or toilet water turns red)  Answer Assessment - Initial Assessment Questions 1. APPEARANCE of BLOOD: "What color is it?" "Is it passed separately, on the surface of the stool, or mixed in with the stool?"      Bright red  2. AMOUNT: "How much blood was passed?"      I feel like it is a lot  3. FREQUENCY: "How many times has blood been passed with the stools?"      3 times  4. ONSET:  "When was the blood first seen in the stools?" (Days or weeks)      This morning around 0230 5. DIARRHEA: "Is there also some diarrhea?" If Yes, ask: "How many diarrhea stools in the past 24 hours?"      Yes, 3 times  6. CONSTIPATION: "Do you have constipation?" If Yes, ask: "How bad is it?"     No  7. RECURRENT SYMPTOMS: "Have you had blood in your stools before?" If Yes, ask: "When was the last time?" and "What happened that time?"      Yes, I had cancer surgery in 2000 but haven't had since  8. BLOOD THINNERS: "Do you take any blood thinners?" (e.g., Coumadin/warfarin, Pradaxa/dabigatran, aspirin)     Plavix, Aspirin 81 MG  9. OTHER SYMPTOMS: "Do you have any other symptoms?"  (e.g., abdomen pain, vomiting, dizziness, fever)     No  10. PREGNANCY: "Is there any chance you are pregnant?" "When was your last menstrual period?"       N/A  Protocols used: Rectal Bleeding-A-AH

## 2023-04-14 LAB — CBC WITH DIFFERENTIAL/PLATELET
Absolute Lymphocytes: 1162 {cells}/uL (ref 850–3900)
Absolute Monocytes: 673 {cells}/uL (ref 200–950)
Basophils Absolute: 12 {cells}/uL (ref 0–200)
Basophils Relative: 0.2 %
Eosinophils Absolute: 12 {cells}/uL — ABNORMAL LOW (ref 15–500)
Eosinophils Relative: 0.2 %
HCT: 32.1 % — ABNORMAL LOW (ref 35.0–45.0)
Hemoglobin: 10.9 g/dL — ABNORMAL LOW (ref 11.7–15.5)
MCH: 32.8 pg (ref 27.0–33.0)
MCHC: 34 g/dL (ref 32.0–36.0)
MCV: 96.7 fL (ref 80.0–100.0)
MPV: 11.4 fL (ref 7.5–12.5)
Monocytes Relative: 11.4 %
Neutro Abs: 4042 {cells}/uL (ref 1500–7800)
Neutrophils Relative %: 68.5 %
Platelets: 198 10*3/uL (ref 140–400)
RBC: 3.32 10*6/uL — ABNORMAL LOW (ref 3.80–5.10)
RDW: 12.8 % (ref 11.0–15.0)
Total Lymphocyte: 19.7 %
WBC: 5.9 10*3/uL (ref 3.8–10.8)

## 2023-04-14 LAB — BASIC METABOLIC PANEL WITH GFR
BUN/Creatinine Ratio: 13 (calc) (ref 6–22)
BUN: 15 mg/dL (ref 7–25)
CO2: 19 mmol/L — ABNORMAL LOW (ref 20–32)
Calcium: 7.6 mg/dL — ABNORMAL LOW (ref 8.6–10.4)
Chloride: 115 mmol/L — ABNORMAL HIGH (ref 98–110)
Creat: 1.13 mg/dL — ABNORMAL HIGH (ref 0.60–0.95)
Glucose, Bld: 162 mg/dL — ABNORMAL HIGH (ref 65–99)
Potassium: 4.9 mmol/L (ref 3.5–5.3)
Sodium: 141 mmol/L (ref 135–146)
eGFR: 48 mL/min/{1.73_m2} — ABNORMAL LOW (ref >=60)

## 2023-04-15 LAB — URINE CULTURE
MICRO NUMBER:: 16319455
SPECIMEN QUALITY:: ADEQUATE

## 2023-04-15 LAB — URINALYSIS, MICROSCOPIC ONLY
RBC / HPF: NONE SEEN /HPF (ref 0–2)
WBC, UA: >=60 /HPF — AB (ref 0–5)

## 2023-04-16 ENCOUNTER — Other Ambulatory Visit: Payer: Self-pay | Admitting: Physician Assistant

## 2023-04-16 DIAGNOSIS — N3000 Acute cystitis without hematuria: Secondary | ICD-10-CM

## 2023-04-16 MED ORDER — CEPHALEXIN 250 MG PO CAPS: 250.0000 mg | ORAL_CAPSULE | Freq: Two times a day (BID) | ORAL | 0 refills | Status: DC

## 2023-04-17 ENCOUNTER — Encounter: Payer: Self-pay | Admitting: Internal Medicine

## 2023-04-17 ENCOUNTER — Ambulatory Visit (INDEPENDENT_AMBULATORY_CARE_PROVIDER_SITE_OTHER): Payer: Medicare Other | Admitting: Internal Medicine

## 2023-04-17 VITALS — BP 118/70 | HR 60 | Temp 98.0°F | Resp 18 | Ht 65.0 in | Wt 171.0 lb

## 2023-04-17 DIAGNOSIS — N179 Acute kidney failure, unspecified: Secondary | ICD-10-CM | POA: Diagnosis not present

## 2023-04-17 DIAGNOSIS — A0472 Enterocolitis due to Clostridium difficile, not specified as recurrent: Secondary | ICD-10-CM

## 2023-04-17 DIAGNOSIS — N390 Urinary tract infection, site not specified: Secondary | ICD-10-CM | POA: Diagnosis not present

## 2023-04-17 MED ORDER — SULFAMETHOXAZOLE-TRIMETHOPRIM 800-160 MG PO TABS: 1.0000 | ORAL_TABLET | Freq: Two times a day (BID) | ORAL | 0 refills | Status: DC

## 2023-04-17 NOTE — Patient Instructions (Addendum)
 Stop cephalexin Start Bactrim DS 1 tablet twice daily for the next 5 days to treat your urinary infection  Take vancomycin until it is gone Call anytime if you develop diarrhea again  Also call if you have fever, chills, blood in the stools, blood in the urine.  Continue taking your diabetes and blood pressure medication according to the list provided.   Check the  blood pressure regularly Blood pressure goal:  between 110/65 and  135/85. If it is consistently higher or lower, let me know     Next office visit for a checkup in 1 month Please make an appointment before you leave today Depending on your blood or XRs results it might be necessary to come back sooner

## 2023-04-17 NOTE — Assessment & Plan Note (Signed)
 C. difficile colitis: Diagnosed with PCR 04/08/2023, to finish vancomycin orally in a couple of days. UTI:  - UA during the admission: + Leukocytes.  UCX: Group B strep. - UA postadmission + leukocyte, moderate bacteria, urine culture show E. coli. At this point she has mild dysuria, some flank pain.  Hard to say if she has truly a UTI versus a asymptomatic bacteriuria. I will err on the side of caution and prescribed Bactrim x 5 days, stop Keflex.  Call if diarrhea resurface. HTN: Currently on amlodipine, atenolol, Imdur.  Not on avapro.  Monitor BPs. Hypocalcemia: Per chart review, improving per last lab, recheck on RTC RTC 1 month

## 2023-04-17 NOTE — Progress Notes (Signed)
 Subjective:    Patient ID: [name], female    DOB: [birth year], 83 y.o.   MRN: [id number]  DOS:  04/17/2023 Type of visit - description: Hospital follow-up  Admitted to the hospital 04/07/2023 and discharged 04/09/2023 - Nausea, vomiting, diarrhea felt to be due to C. difficile colitis, discharged home on oral vancomycin. - Acute kidney injury, creatinine went up to 2.0. - Was recommended to hold Avapro and amlodipine as BP was soft. - Chronic DOE: Echo normal.   Subsequently saw Mardella Layman in our office 04/13/2023, at the time -she still has some diarrhea, saw blood in stools - Minimal dysuria, some R flank pain. -creatinine rechecked and decreased to 1.3, calcium was low at 7.6 but improving.  Urine culture showed E. coli, treated with Keflex.  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   Gastritis    GERD (gastroesophageal reflux disease)    Headache(784.0)    History of blood transfusion    "I"ve had 20 some; thru birth of children, loss of blood, last 2 were in ~ 1999 before my cancer surgery" (09/16/2015)   History of hiatal hernia    History of shingles    Hx of colonic polyps    Hyperlipidemia    Hypertension    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HCC) 2000   Myocardial infarction (HCC)    1997   Pneumonia    X 1   Postoperative hematoma involving circulatory system following cardiac catheterization 09/17/2015   S/P cardiac cath 09/16/15 09/17/2015   Type II diabetes  mellitus (HCC)    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[facility])    as a child   Venous insufficiency     Past Surgical History:  Procedure Laterality Date   BREAST BIOPSY Left 03/10/2022   MM LT BREAST BX W LOC DEV 1ST LESION IMAGE BX SPEC STEREO GUIDE 03/10/2022 [facility]   BREAST BIOPSY  05/01/2022   MM LT RADIOACTIVE SEED LOC MAMMO GUIDE 05/01/2022 [facility]   BREAST LUMPECTOMY WITH RADIOACTIVE SEED LOCALIZATION Left 05/02/2022   Procedure: LEFT BREAST LUMPECTOMY WITH RADIOACTIVE SEED LOCALIZATION;  Surgeon: Harriette Bouillon, MD;  Location: [facility] OR;  Service: General;  Laterality: Left;   CARDIAC CATHETERIZATION  1997; 2008; 09/16/2015   CARDIAC CATHETERIZATION N/A 09/16/2015   Procedure: Right/Left Heart Cath and Coronary/Graft Angiography;  Surgeon: Runell Gess, MD;  Location: [facility];  Service: Cardiovascular;  Laterality: N/A;   CESAREAN SECTION  1966   COLONOSCOPY     CORONARY ARTERY BYPASS GRAFT  1997   CABG X5   EYE SURGERY     2 laser surgeries on left eye with implant   EYE SURGERY Bilateral    cataracts   FEMUR IM NAIL Left 08/28/2018   Procedure: INTRAMEDULLARY (IM) NAIL FEMORAL;  Surgeon: Samson Frederic, MD;  Location: [facility];  Service: Orthopedics;  Laterality:  Left;   FRACTURE SURGERY     HERNIA REPAIR     LAPAROSCOPIC ASSISTED VENTRAL HERNIA REPAIR  09/2008    with incarcerated colon;  Dr. Ezzard Standing   MOHS SURGERY  06/2016   [facility]   PATELLA FRACTURE SURGERY Left 11/1994   "crushed my knee"; put in a plate & 6 screws"   POLYPECTOMY     RE-EXCISION OF BREAST LUMPECTOMY Left 05/18/2022   Procedure: RE-EXCISION OF LEFT BREAST LUMPECTOMY;  Surgeon: Harriette Bouillon, MD;  Location: [facility];  Service: General;  Laterality: Left;  60 MIN ROOM 8   REFRACTIVE SURGERY Left 07/30/2015   resection of small bowel carcinoma  06/1998   Dr. Samuella Cota   SHOULDER ARTHROSCOPY W/ ROTATOR CUFF REPAIR Right 1997   SMALL INTESTINE SURGERY      TONSILLECTOMY  1960s   TOTAL KNEE ARTHROPLASTY WITH HARDWARE REMOVAL Left 12/1994   (infex, hardware removed )- not replacement per patient   TUBAL LIGATION  1966    Current Outpatient Medications  Medication Instructions   acetaminophen (TYLENOL) 500 mg, Oral, Every 6 hours PRN   amLODipine (NORVASC) 5 MG tablet Take 1 tablet by mouth daily   aspirin 81 mg, Oral, Daily   atenolol (TENORMIN) 100 mg, Oral, Daily   atorvastatin (LIPITOR) 20 MG tablet TAKE 1 TABLET(20 MG) BY MOUTH DAILY   Azelastine HCl 137 MCG/SPRAY SOLN At bedtime PRN   calcium carbonate (OS-CAL - DOSED IN MG OF ELEMENTAL CALCIUM) 1,250 mg, Oral, 3 times daily with meals   cephALEXin (KEFLEX) 250 mg, Oral, 2 times daily   clopidogrel (PLAVIX) 75 mg, Oral, Daily   Continuous Glucose Sensor (FREESTYLE LIBRE 3 PLUS SENSOR) MISC 1 each, Does not apply, Continuous, Change every 15 days.   diazepam (VALIUM) 5 mg, Oral, Every 6 hours PRN   fluticasone (FLONASE) 50 MCG/ACT nasal spray SHAKE LIQUID AND USE 2 SPRAYS IN EACH NOSTRIL DAILY   glucose blood (ONETOUCH ULTRA) test strip USE AS DIRECTED THREE TIMES DAILY   Gvoke HypoPen 1-Pack 1 mg, Subcutaneous, As needed   insulin NPH Human (NOVOLIN N) 100 UNIT/ML injection Inject 8 units into the skin every morning and inject 5 units into the skin at evening meal.   insulin regular (NOVOLIN R) 6 Units, Subcutaneous, 2 times daily before meals   Insulin Syringe-Needle U-100 (INSULIN SYRINGE .5CC/31GX5/16") 31G X 5/16" 0.5 ML MISC USE TO INJECT INSULIN 5 TIMES PER DAY   isosorbide mononitrate (IMDUR) 30 mg, Oral, Daily   metFORMIN (GLUCOPHAGE) 500 MG tablet TAKE 1 TABLET(500 MG) BY MOUTH DAILY WITH SUPPER   mometasone (ELOCON) 0.1 % cream 1 Application, Daily PRN   Multiple Vitamin (MULTIVITAMIN) capsule 1 capsule, Daily   omeprazole (PRILOSEC) 20 MG capsule TAKE 1 CAPSULE(20 MG) BY MOUTH DAILY   ondansetron (ZOFRAN) 4 mg, Oral, Every 8 hours PRN   Ozempic (0.25 or 0.5 MG/DOSE) 0.5 mg,  Subcutaneous, Every Mon   vancomycin (VANCOCIN) 125 mg, Oral, 4 times daily   Venclexta 100 mg, Oral, Daily, Tablets should be swallowed whole with a meal and a full glass of water.   VITAMIN D PO 1 tablet, Daily       Objective:   Physical Exam BP 118/70   Pulse 60   Temp 98 F (36.7 C) (Oral)   Resp 18   Ht 5\' 5"  (1.651 m)   Wt 171 lb (77.6 kg)   SpO2 98%   BMI 28.46 kg/m  General:   Well developed, NAD, BMI  noted.  HEENT:  Normocephalic . Face symmetric, atraumatic Lungs:  CTA B Normal respiratory effort, no intercostal retractions, no accessory muscle use. Heart: RRR,  no murmur.  Abdomen:  Not distended, soft, minimal tenderness at the right side of the abdomen without mass or rebound. Questionable R side CVA tenderness to percussion .   Skin: Not pale. Not jaundice Lower extremities: no pretibial edema bilaterally  Neurologic:  alert & oriented X3.  Speech normal, gait and transferring limited by MSK issues.   Psych--  Cognition and judgment appear intact.  Cooperative with normal attention span and concentration.  Behavior appropriate. No anxious or depressed appearing.     Assessment    Assessment DM , +retinopathy, per endo- HTN Hyperlipidemia CKD: creat ~1.4  (GFR ~ 38) US  09-2019: No obstruction, bilateral cortical thinning and a small renal cysts Depression/anxiety: tranxene qhs prn (takes rarely) MSK: -DJD  - Osteoporosis ---T score -0.8 on December 2017, h/o a foot FX d/t walking years ago --- (L) Hip Fx, 08/2018: started fosamax, switch to Prolia d/t CKD and diff swallowing, first dose 12-2019 Hem/Onc: Dr Maria Shiner  -CLL -iron deficiency anemia: IV iron 2018 -B12 def -L DCIS, surgery 2024. CAD, Dr Katheryne Pane MI 24 >> CABG, cath 12-13-2006, myoview 2012 no ischemic CP: Stress test 08/05/2015, indeterminate risk study, + lateral ischemia: Cardiac catheterization 09/16/2015: Rx medical Venous insuff GI:  Dr Leonia Raman ---GERD, IBS, h/o  Gastritis ---H/o ulcerative colitis as a child H/o shingles  BCC Dr Fleurette Humbles, bx 04-2015, MOH's 06-2016   PLAN C. difficile colitis: Diagnosed with PCR 04/08/2023, to finish vancomycin orally in a couple of days. UTI:  - UA during the admission: + Leukocytes.  UCX: Group B strep. - UA postadmission + leukocyte, moderate bacteria, urine culture show E. coli. At this point she has mild dysuria, some flank pain.  Hard to say if she has truly a UTI versus a asymptomatic bacteriuria. I will err on the side of caution and prescribed Bactrim x 5 days, stop Keflex.  Call if diarrhea resurface. HTN: Currently on amlodipine, atenolol, Imdur.  Not on avapro.  Monitor BPs. Hypocalcemia: Per chart review, improving per last lab, recheck on RTC RTC 1 month

## 2023-04-19 ENCOUNTER — Other Ambulatory Visit: Payer: Self-pay

## 2023-04-23 ENCOUNTER — Other Ambulatory Visit: Payer: Self-pay | Admitting: Cardiovascular Disease

## 2023-04-30 ENCOUNTER — Ambulatory Visit (INDEPENDENT_AMBULATORY_CARE_PROVIDER_SITE_OTHER): Payer: Medicare Other | Admitting: Endocrinology

## 2023-04-30 ENCOUNTER — Encounter: Payer: Self-pay | Admitting: Endocrinology

## 2023-04-30 VITALS — BP 138/90 | HR 63 | Resp 20 | Ht 65.0 in | Wt 168.8 lb

## 2023-04-30 DIAGNOSIS — N1832 Chronic kidney disease, stage 3b: Secondary | ICD-10-CM

## 2023-04-30 DIAGNOSIS — Z794 Long term (current) use of insulin: Secondary | ICD-10-CM

## 2023-04-30 DIAGNOSIS — M810 Age-related osteoporosis without current pathological fracture: Secondary | ICD-10-CM | POA: Diagnosis not present

## 2023-04-30 DIAGNOSIS — E1122 Type 2 diabetes mellitus with diabetic chronic kidney disease: Secondary | ICD-10-CM

## 2023-04-30 LAB — POCT GLYCOSYLATED HEMOGLOBIN (HGB A1C): Hemoglobin A1C: 6.2 % — AB (ref 4.0–5.6)

## 2023-04-30 MED ORDER — INSULIN REGULAR HUMAN 100 UNIT/ML IJ SOLN: 5.0000 [IU] | Freq: Two times a day (BID) | INTRAMUSCULAR | 3 refills | Status: DC

## 2023-04-30 MED ORDER — INSULIN NPH (HUMAN) (ISOPHANE) 100 UNIT/ML ~~LOC~~ SUSP: SUBCUTANEOUS | 3 refills | Status: DC

## 2023-04-30 NOTE — Patient Instructions (Signed)
 Diabetes regimen: Novolin N 20 units in the morning and 6 units in the evening with meals. Decrease night dose of Novolin N to 6 units.  Regular insulin  5 units two times a day with meals. 3.   Continue ozempic  and metformin  same.  Take glucose tablet take 2-4 tablets when glucose is low. It is over the counter.

## 2023-04-30 NOTE — Progress Notes (Signed)
 Outpatient Endocrinology Note [place] Tamara Kenyon, MD  04/30/23  Patient's Name: [name]    DOB: 04-Nov-1940    MRN: [id number]                                                    REASON OF VISIT: Follow up for type 2 diabetes mellitus /osteoporosis  PCP: Ezell Hollow, MD  HISTORY OF PRESENT ILLNESS:   [name] is a 83 y.o. old female with past medical history listed below, is here for follow up of type 2 diabetes mellitus / osteoporosis.   Pertinent Diabetes History: Patient was diagnosed with type 2 diabetes mellitus in 1998.   Chronic Diabetes Complications : Retinopathy: yes following with ophthalmology.   Nephropathy: CKD 3B, on irbesartan  Peripheral neuropathy: no Coronary artery disease: yes, CABG Stroke: no  Relevant comorbidities and cardiovascular risk factors: Obesity: no Body mass index is 28.09 kg/m.  Hypertension: yes Hyperlipidemia. Yes, on a statin  Current / Home Diabetic regimen includes: Novolin N 20 units in the morning and 10 units in the evening. Regular insulin  5 units with breakfast and 5 units with supper. Metformin  500 mg daily. Ozempic  0.5 mg weekly.  Prior diabetic medications: Victoza  in the past, changed to Ozempic  due to insurance preference in ~ May 2024. Insulin  doses were decreased in October due to hypoglycemia.  Glycemic data:   CONTINUOUS GLUCOSE MONITORING SYSTEM (CGMS) INTERPRETATION: At today's visit, we reviewed CGM downloads. The full report is scanned in the media. Reviewing the CGM trends, blood glucose are as follows:  FreeStyle Libre 3 CGM-  Sensor Download (Sensor download was reviewed and summarized below.) Dates: April 15 to April 28 , 2025, 14 days. Sensor Average: 166 Glucose Management Indicator: 6.5%  % data captured: 92%    Interpretation: Mostly acceptable blood sugar.  Occasional mild hyperglycemia with blood sugar up to 180-200 range postprandially.  Blood sugar overnight mostly in the low  normal range 80-120.  No hypoglycemia.  Hypoglycemia: Patient has no hypoglycemic episodes. Patient has hypoglycemia ? awareness.  Factors modifying glucose control: 1.  Diabetic diet assessment: 2-3 meals a day, she sometimes does not eat dinner.  At suppertime she usually does like to snack.  2.  Staying active or exercising: No formal exercise.  3.  Medication compliance: compliant all of the time.  # Osteoporosis : -Osteoporosis was being managed by primary care provider and referred to endocrinology after her DEXA scan in July 18, 2022.  -DEXA scan in July 18, 2022 T-score AP spine L1-L4 -0.7  and Right hip total -2.7 consistent with osteoporosis. ( Left hip not used due to surgery)  DEX scan in 12/2015, T-Score: AP spine -0.8, RFN -2.5, LFN -2.9 consistent with osteoporosis. (GE lunar)  Patient had remote history of foot fracture due to walking.  She had a history of left hip fracture in August 2020.  Treatment history: In August 2020 started on Fosamax  and later switched to Prolia  due to CKD and difficulty swallowing, first dose of Prolia  in December 2021, she has been Prolia  every 6 months without missing and last dose was in January 2025.  She is currently taking vitamin D3 1000 international unit daily, multivitamin 1 tablet daily.  She is taking additional calcium .  She eats yogurt daily and fair amount of cheese.  CTX in August  2024 was 175 normal.  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  During the chemotherapy course she had received multiple course of dexamethasone , which is on 6 factor for continued bone loss.  Patient has remote history of colon cancer. DCIS left breast.   Interval history  She is employed as reviewed above, mostly acceptable blood sugar.  Diabetes regimen as reviewed and noted above.  She has no complaints.  She has been taking calcium  and vitamin D  supplement.  No fall and fracture.  No other complaints today.   Hemoglobin A1c 6.2%.  GMI on CGM 6.5%.  REVIEW OF SYSTEMS As per history of present illness.   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   Gastritis    GERD (gastroesophageal reflux disease)    Headache(784.0)    History of blood transfusion    "I"ve had 20 some; thru birth of children, loss of blood, last 2 were in ~ 1999 before my cancer surgery" (09/16/2015)   History of hiatal hernia    History of shingles    Hx of colonic polyps    Hyperlipidemia    Hypertension    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HCC) 2000   Myocardial infarction (HCC)    1997   Pneumonia    X 1   Postoperative hematoma involving circulatory system following cardiac catheterization 09/17/2015   S/P cardiac cath 09/16/15 09/17/2015   Type II diabetes mellitus (HCC)    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[facility])    as a child   Venous insufficiency     PAST SURGICAL HISTORY: Past Surgical History:  Procedure Laterality Date   BREAST BIOPSY Left 03/10/2022   MM LT BREAST BX W LOC DEV 1ST LESION IMAGE BX SPEC STEREO GUIDE 03/10/2022 [facility]   BREAST BIOPSY  05/01/2022   MM LT RADIOACTIVE SEED LOC MAMMO GUIDE 05/01/2022 [facility]   BREAST LUMPECTOMY WITH RADIOACTIVE SEED LOCALIZATION Left 05/02/2022   Procedure: LEFT BREAST LUMPECTOMY WITH RADIOACTIVE SEED LOCALIZATION;  Surgeon: Sim Dryer, MD;  Location: [facility];  Service: General;  Laterality: Left;   CARDIAC CATHETERIZATION  1997; 2008; 09/16/2015    CARDIAC CATHETERIZATION N/A 09/16/2015   Procedure: Right/Left Heart Cath and Coronary/Graft Angiography;  Surgeon: Avanell Leigh, MD;  Location: [facility];  Service: Cardiovascular;  Laterality: N/A;   CESAREAN SECTION  1966   COLONOSCOPY     CORONARY ARTERY BYPASS GRAFT  1997   CABG X5   EYE SURGERY     2 laser surgeries on left eye with implant   EYE SURGERY Bilateral    cataracts   FEMUR IM NAIL Left 08/28/2018   Procedure: INTRAMEDULLARY (IM) NAIL FEMORAL;  Surgeon: Adonica Hoose, MD;  Location: [facility];  Service: Orthopedics;  Laterality: Left;   FRACTURE SURGERY     HERNIA REPAIR     LAPAROSCOPIC ASSISTED VENTRAL HERNIA REPAIR  09/2008    with incarcerated colon;  Dr. 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 Marlow Sin   SHOULDER ARTHROSCOPY W/ ROTATOR CUFF REPAIR Right 1997   SMALL INTESTINE SURGERY     TONSILLECTOMY  1960s   TOTAL KNEE ARTHROPLASTY WITH HARDWARE REMOVAL Left 12/1994   (infex, hardware removed )- not replacement per patient   TUBAL LIGATION  1966    ALLERGIES: Allergies  Allergen Reactions   Codeine Other (See Comments)    REACTION: makes her nervous, orTylenol #3   Ibuprofen Other (See Comments)    REACTION: nervous   Meperidine  Hcl Nausea And Vomiting   Naproxen Sodium Other (See Comments)    Or Advil  REACTION: nervous   Tramadol  Other (See Comments)    Insomnia    Tylenol  With Codeine #3 [Acetaminophen -Codeine] Nausea And Vomiting    FAMILY HISTORY:  Family History  Problem Relation Age of Onset   Heart disease Mother  45   Heart disease Father 47       MI   Hypertension Child    Heart attack Child    Diabetes Child    Heart disease Maternal Aunt        x 2, all deceased   Heart disease Maternal Uncle        x 4, all deceased   Emphysema Brother 41   Colon cancer Neg Hx    Breast cancer Neg Hx    Rectal cancer Neg Hx    Stomach cancer Neg Hx     SOCIAL HISTORY: Social History   Socioeconomic History   Marital status: Widowed    Spouse name: Not on file   Number of children: 2   Years of education: Not on file   Highest education level: Not on file  Occupational History   Occupation: retired-- [employer]     Employer: RETIRED  Tobacco Use   Smoking status: Never   Smokeless tobacco: Never  Vaping Use   Vaping status: Never Used  Substance and Sexual Activity   Alcohol  use: No    Alcohol /week: 0.0 standard drinks of alcohol    Drug use: No   Sexual activity: Not Currently  Other Topics Concern   Not on file  Social History Narrative   Lives by herself, lost husband ~ 2004 , still drives    1 child in [place]   I child in [place]    Social Drivers of Health   Financial Resource Strain: Low Risk  (06/29/2022)   Overall Financial Resource Strain (CARDIA)    Difficulty of Paying Living Expenses: Not hard at all  Food Insecurity: No Food Insecurity (04/10/2023)   Hunger Vital Sign    Worried About Running Out of Food in the Last Year: Never true    Ran Out of Food in the Last Year: Never true  Transportation Needs: No Transportation Needs (04/10/2023)   PRAPARE - Administrator, Civil Service (Medical): No    Lack of Transportation (Non-Medical): No  Physical Activity: Inactive (06/29/2022)  Exercise Vital Sign    Days of Exercise per Week: 0 days    Minutes of Exercise per Session: 0 min  Stress: No Stress Concern Present (06/29/2022)   [employer] of Occupational Health - Occupational Stress Questionnaire    Feeling of Stress : Only a little  Social Connections:  Moderately Integrated (04/08/2023)   Social Connection and Isolation Panel [NHANES]    Frequency of Communication with Friends and Family: More than three times a week    Frequency of Social Gatherings with Friends and Family: More than three times a week    Attends Religious Services: More than 4 times per year    Active Member of [employer] or Organizations: Yes    Attends Banker Meetings: More than 4 times per year    Marital Status: Widowed    MEDICATIONS:  Current Outpatient Medications  Medication Sig Dispense Refill   acetaminophen  (TYLENOL ) 500 MG tablet Take 1 tablet (500 mg total) by mouth every 6 (six) hours as needed. (Patient taking differently: Take 500 mg by mouth every 6 (six) hours as needed for mild pain (pain score 1-3) or moderate pain (pain score 4-6).) 30 tablet 0   amLODipine  (NORVASC ) 5 MG tablet Take 1 tablet by mouth daily 90 tablet 1   aspirin  81 MG chewable tablet Chew 1 tablet (81 mg total) by mouth daily.     atenolol  (TENORMIN ) 100 MG tablet Take 1 tablet (100 mg total) by mouth daily. 90 tablet 1   atorvastatin  (LIPITOR) 20 MG tablet TAKE 1 TABLET(20 MG) BY MOUTH DAILY (Patient taking differently: Take 20 mg by mouth daily.) 90 tablet 3   Azelastine  HCl 137 MCG/SPRAY SOLN Place into the nose at bedtime as needed.     calcium  carbonate (OS-CAL - DOSED IN MG OF ELEMENTAL CALCIUM ) 1250 (500 Ca) MG tablet Take 1 tablet (1,250 mg total) by mouth 3 (three) times daily with meals. 90 tablet 0   clopidogrel  (PLAVIX ) 75 MG tablet Take 1 tablet (75 mg total) by mouth daily. (Patient taking differently: Take 75 mg by mouth at bedtime.) 90 tablet 1   Continuous Glucose Sensor (FREESTYLE LIBRE 3 PLUS SENSOR) MISC 1 each by Does not apply route continuous. Change every 15 days. 6 each 4   diazepam  (VALIUM ) 5 MG tablet Take 1 tablet (5 mg total) by mouth every 6 (six) hours as needed for anxiety. 20 tablet 0   fluticasone  (FLONASE ) 50 MCG/ACT nasal spray SHAKE LIQUID  AND USE 2 SPRAYS IN EACH NOSTRIL DAILY (Patient taking differently: Place 2 sprays into both nostrils as needed for allergies.) 48 g 3   Glucagon  (GVOKE HYPOPEN  1-PACK) 1 MG/0.2ML SOAJ Inject 1 mg into the skin as needed (low blood sugar with impaired consciousness). 0.4 mL 2   glucose blood (ONETOUCH ULTRA) test strip USE AS DIRECTED THREE TIMES DAILY 100 strip 5   Insulin  Syringe-Needle U-100 (INSULIN  SYRINGE .5CC/31GX5/16") 31G X 5/16" 0.5 ML MISC USE TO INJECT INSULIN  5 TIMES PER DAY 200 each 0   isosorbide  mononitrate (IMDUR ) 30 MG 24 hr tablet TAKE 1 TABLET BY MOUTH EVERY DAY 90 tablet 3   metFORMIN  (GLUCOPHAGE ) 500 MG tablet TAKE 1 TABLET(500 MG) BY MOUTH DAILY WITH SUPPER (Patient taking differently: Take 500 mg by mouth at bedtime.) 90 tablet 1   mometasone (ELOCON) 0.1 % cream Apply 1 Application topically daily as needed (itchy ears). Use as directed     Multiple Vitamin (MULTIVITAMIN) capsule Take 1 capsule by mouth daily.  Centrum     omeprazole  (PRILOSEC) 20 MG capsule TAKE 1 CAPSULE(20 MG) BY MOUTH DAILY (Patient taking differently: Take 20 mg by mouth daily.) 90 capsule 3   ondansetron  (ZOFRAN ) 4 MG tablet Take 1 tablet (4 mg total) by mouth every 8 (eight) hours as needed for nausea or vomiting. 20 tablet 0   Semaglutide ,0.25 or 0.5MG /DOS, (OZEMPIC , 0.25 OR 0.5 MG/DOSE,) 2 MG/3ML SOPN Inject 0.5 mg into the skin every Monday. 9 mL 0   sulfamethoxazole -trimethoprim  (BACTRIM  DS) 800-160 MG tablet Take 1 tablet by mouth 2 (two) times daily. 10 tablet 0   venetoclax  (VENCLEXTA ) 100 MG tablet Take 1 tablet (100 mg total) by mouth daily. Tablets should be swallowed whole with a meal and a full glass of water. (Patient taking differently: Take 100 mg by mouth in the morning. Tablets should be swallowed whole with a meal and a full glass of water.) 28 tablet 3   VITAMIN D  PO Take 1 tablet by mouth daily.     insulin  NPH Human (NOVOLIN N) 100 UNIT/ML injection Inject 20 units into the skin  every morning and inject 6 units into the skin at evening meal. 10 mL 3   insulin  regular (NOVOLIN R) 100 units/mL injection Inject 0.05 mLs (5 Units total) into the skin 2 (two) times daily before a meal. 10 mL 3   Current Facility-Administered Medications  Medication Dose Route Frequency Provider Last Rate Last Admin   [START ON 06/18/2023] denosumab  (PROLIA ) injection 60 mg  60 mg Subcutaneous Once Paz, Jose E, MD       Facility-Administered Medications Ordered in Other Visits  Medication Dose Route Frequency Provider Last Rate Last Admin   vancomycin  (VANCOCIN ) 1,000 mg in sodium chloride  0.9 % 500 mL IVPB  1,000 mg Intravenous Once Cornett, Andy Bannister, MD        PHYSICAL EXAM: Vitals:   04/30/23 1321 04/30/23 1322  BP: (!) 142/80 (!) 138/90  Pulse: 63   Resp: 20   SpO2: 99%   Weight: 168 lb 12.8 oz (76.6 kg)   Height: 5\' 5"  (1.651 m)     Body mass index is 28.09 kg/m.  Wt Readings from Last 3 Encounters:  04/30/23 168 lb 12.8 oz (76.6 kg)  04/17/23 171 lb (77.6 kg)  04/13/23 168 lb (76.2 kg)    General: Well developed, well nourished female in no apparent distress.  HEENT: AT/[place], no external lesions.  Eyes: Conjunctiva clear and no icterus. Neck: Neck supple  Lungs: Respirations not labored Neurologic: Alert, oriented, normal speech Extremities / Skin: Dry.    Psychiatric: Does not appear depressed or anxious  Diabetic Foot Exam - Simple   Simple Foot Form Visual Inspection See comments: Yes Sensation Testing Intact to touch and monofilament testing bilaterally: Yes Pulse Check Posterior Tibialis and Dorsalis pulse intact bilaterally: Yes Comments Bilateral bunions and callus +. No ulcer.     LABS Reviewed Lab Results  Component Value Date   HGBA1C 6.2 (A) 04/30/2023   HGBA1C 5.4 11/29/2022   HGBA1C 5.0 08/31/2022   Lab Results  Component Value Date   FRUCTOSAMINE 205 05/11/2022   FRUCTOSAMINE 207 10/25/2021   FRUCTOSAMINE 230 06/28/2021   Lab Results   Component Value Date   CHOL 110 07/17/2022   HDL 37.20 (L) 07/17/2022   LDLCALC 41 05/05/2020   LDLDIRECT 38.0 07/17/2022   TRIG 262.0 (H) 07/17/2022   CHOLHDL 3 07/17/2022   Lab Results  Component Value Date   MICRALBCREAT 14.5 05/11/2022  MICRALBCREAT 12.7 12/09/2020   Lab Results  Component Value Date   CREATININE 1.13 (H) 04/13/2023   Lab Results  Component Value Date   GFR 40.00 (L) 08/31/2022    ASSESSMENT / PLAN  1. Type 2 diabetes mellitus with stage 3b chronic kidney disease, with long-term current use of insulin  (HCC)   2. Age-related osteoporosis without current pathological fracture      Diabetes Mellitus type 2, complicated by CKD/ CAD - Diabetic status / severity: Fair control.  Lab Results  Component Value Date   HGBA1C 6.2 (A) 04/30/2023    - Hemoglobin A1c goal : <7.5%  Patient previously had hemoglobin A1c in the less than 5% likely falsely low due to anemia.  GMI on CGM 6.5%.  Due to low normal blood sugar overnight I would like to decrease the Novolin N for the evening dose, adjusted diabetes regimen as follows.  Diabetes regimen: Diabetes regimen: Continue Novolin N 20 units in the morning and decrease from 10 units to 6 units in the evening with meals. Continue regular insulin  5 units two times a day with meals. 3.   Continue ozempic  0.5 mg weekly and metformin  500 mg daily.  Take glucose tablet take 2-4 tablets when glucose is low. It is over the counter.  - Home glucose testing: Continue CGM/freestyle libre 3+, sent new prescription.  Check blood sugar as needed.  - Discussed/ Gave Hypoglycemia treatment plan.  In detail about risks of hypoglycemia.  Patient has been getting glucose tablet.  Patient lives by herself not able to use Glucagon  Emergency Kit, sent prescription for glucagon  kit as well.  Discussed that hypoglycemia can be immediately and acutely dangerous and even life-threatening.  # Consult : not required at this time.    # Annual urine for microalbuminuria/ creatinine ratio, no microalbuminuria currently, continue ACE/ARB /irbesartan .  Will check urine microalbumin creatinine ratio today. Last  Lab Results  Component Value Date   MICRALBCREAT 14.5 05/11/2022    # Foot check nightly / neuropathy.  # Annual dilated diabetic eye exams.   2. Blood pressure  -  BP Readings from Last 1 Encounters:  04/30/23 (!) 138/90    - Control is in target.  - No change in current plans.  3. Lipid status / Hyperlipidemia - Last  Lab Results  Component Value Date   LDLCALC 41 05/05/2020   - Continue atorvastatin  20 mg daily.  Managed by primary care provider.  # Osteoporosis -Patient had recent DEXA scan in July 2024 consistent with osteoporosis based on lowest T-score of -2.7 at right hip and T-score at lumbar spine -0.7.  She had DEXA scan in 2017 with lowest T-score of -2.9 at femoral neck and T-score of -0.8 at lumbar spine consistent with osteoporosis. -She was treated with Fosamax  initially started in August 2020 and switched to Prolia .  First dose was in December 2021, she has received Prolia  every 6 months and last dose was in July 2024. -She has no recent fracture. -Not able to compare bone density done in 2017 and bone density in July 2024 as they were done in different places.  However overall it seems to be stable, over the period of 7 years, despite she had received multiple doses of dexamethasone  for the treatment of CLL in last 2 years which is a risk for bone loss.  Plan: -Continue Prolia  60 mg subcutaneous every 6 months.  Last injection in January 18, 2023. -Continue current dose of vitamin D  and calcium . Take  calcium  500 mg daily in addition to dietary calcium  and multivitamin. -Discussed about fall precautions.  Diagnoses and all orders for this visit:  Type 2 diabetes mellitus with stage 3b chronic kidney disease, with long-term current use of insulin  (HCC) -     POCT glycosylated  hemoglobin (Hb A1C) -     insulin  regular (NOVOLIN R) 100 units/mL injection; Inject 0.05 mLs (5 Units total) into the skin 2 (two) times daily before a meal. -     insulin  NPH Human (NOVOLIN N) 100 UNIT/ML injection; Inject 20 units into the skin every morning and inject 6 units into the skin at evening meal. -     Microalbumin / creatinine urine ratio  Age-related osteoporosis without current pathological fracture    DISPOSITION Follow up in clinic in 3 months suggested.   All questions answered and patient verbalized understanding of the plan.  Iraq Holmes Hays, MD [facility] Endocrinology [facility] [address] [place], [address] [place], [place] [address] Phone # [phone]  At least part of this note was generated using voice recognition software. Inadvertent word errors may have occurred, which were not recognized during the proofreading process.

## 2023-05-01 LAB — MICROALBUMIN / CREATININE URINE RATIO
Creatinine, Urine: 57 mg/dL (ref 20–275)
Microalb Creat Ratio: 281 mg/g{creat} — ABNORMAL HIGH (ref <30)
Microalb, Ur: 16 mg/dL

## 2023-05-10 ENCOUNTER — Telehealth: Payer: Self-pay | Admitting: Internal Medicine

## 2023-05-10 NOTE — Telephone Encounter (Signed)
 Copied from CRM [id number]. Topic: Medicare AWV >> May 10, 2023  9:46 AM Juliana Ocean wrote: Reason for CRM: LVM 05/10/2023 to schedule AWV. Please schedule Virtual or Telehealth visits ONLY.   [name]; Care Guide Ambulatory Clinical Support [facility] Direct Dial: [phone]

## 2023-05-15 ENCOUNTER — Other Ambulatory Visit: Payer: Self-pay

## 2023-05-15 ENCOUNTER — Encounter: Payer: Self-pay | Admitting: Family

## 2023-05-15 ENCOUNTER — Inpatient Hospital Stay (HOSPITAL_BASED_OUTPATIENT_CLINIC_OR_DEPARTMENT_OTHER): Admitting: Family

## 2023-05-15 ENCOUNTER — Other Ambulatory Visit (HOSPITAL_COMMUNITY): Payer: Self-pay

## 2023-05-15 ENCOUNTER — Inpatient Hospital Stay: Attending: Hematology & Oncology

## 2023-05-15 VITALS — BP 150/59 | HR 66 | Temp 97.9°F | Resp 18 | Wt 169.0 lb

## 2023-05-15 DIAGNOSIS — Z86000 Personal history of in-situ neoplasm of breast: Secondary | ICD-10-CM | POA: Diagnosis not present

## 2023-05-15 DIAGNOSIS — D508 Other iron deficiency anemias: Secondary | ICD-10-CM

## 2023-05-15 DIAGNOSIS — D0512 Intraductal carcinoma in situ of left breast: Secondary | ICD-10-CM | POA: Diagnosis not present

## 2023-05-15 DIAGNOSIS — Z9221 Personal history of antineoplastic chemotherapy: Secondary | ICD-10-CM | POA: Diagnosis not present

## 2023-05-15 DIAGNOSIS — D509 Iron deficiency anemia, unspecified: Secondary | ICD-10-CM | POA: Diagnosis not present

## 2023-05-15 DIAGNOSIS — Z85038 Personal history of other malignant neoplasm of large intestine: Secondary | ICD-10-CM | POA: Diagnosis not present

## 2023-05-15 DIAGNOSIS — Q999 Chromosomal abnormality, unspecified: Secondary | ICD-10-CM | POA: Diagnosis not present

## 2023-05-15 DIAGNOSIS — C9110 Chronic lymphocytic leukemia of B-cell type not having achieved remission: Secondary | ICD-10-CM | POA: Insufficient documentation

## 2023-05-15 LAB — CMP (CANCER CENTER ONLY)
ALT: 22 U/L (ref 0–44)
AST: 22 U/L (ref 15–41)
Albumin: 4.4 g/dL (ref 3.5–5.0)
Alkaline Phosphatase: 76 U/L (ref 38–126)
Anion gap: 7 (ref 5–15)
BUN: 22 mg/dL (ref 8–23)
CO2: 29 mmol/L (ref 22–32)
Calcium: 9.7 mg/dL (ref 8.9–10.3)
Chloride: 107 mmol/L (ref 98–111)
Creatinine: 1.28 mg/dL — ABNORMAL HIGH (ref 0.44–1.00)
GFR, Estimated: 42 mL/min — ABNORMAL LOW (ref >60)
Glucose, Bld: 131 mg/dL — ABNORMAL HIGH (ref 70–99)
Potassium: 4 mmol/L (ref 3.5–5.1)
Sodium: 143 mmol/L (ref 135–145)
Total Bilirubin: 0.4 mg/dL (ref 0.0–1.2)
Total Protein: 6.9 g/dL (ref 6.5–8.1)

## 2023-05-15 LAB — CBC WITH DIFFERENTIAL (CANCER CENTER ONLY)
Abs Immature Granulocytes: 0.03 10*3/uL (ref 0.00–0.07)
Basophils Absolute: 0 10*3/uL (ref 0.0–0.1)
Basophils Relative: 0 %
Eosinophils Absolute: 0 10*3/uL (ref 0.0–0.5)
Eosinophils Relative: 0 %
HCT: 34.2 % — ABNORMAL LOW (ref 36.0–46.0)
Hemoglobin: 11.5 g/dL — ABNORMAL LOW (ref 12.0–15.0)
Immature Granulocytes: 1 %
Lymphocytes Relative: 24 %
Lymphs Abs: 1.4 10*3/uL (ref 0.7–4.0)
MCH: 33.2 pg (ref 26.0–34.0)
MCHC: 33.6 g/dL (ref 30.0–36.0)
MCV: 98.8 fL (ref 80.0–100.0)
Monocytes Absolute: 0.7 10*3/uL (ref 0.1–1.0)
Monocytes Relative: 12 %
Neutro Abs: 3.6 10*3/uL (ref 1.7–7.7)
Neutrophils Relative %: 63 %
Platelet Count: 161 10*3/uL (ref 150–400)
RBC: 3.46 MIL/uL — ABNORMAL LOW (ref 3.87–5.11)
RDW: 13 % (ref 11.5–15.5)
WBC Count: 5.7 10*3/uL (ref 4.0–10.5)
nRBC: 0 % (ref 0.0–0.2)

## 2023-05-15 LAB — FERRITIN: Ferritin: 636 ng/mL — ABNORMAL HIGH (ref 11–307)

## 2023-05-15 LAB — IRON AND IRON BINDING CAPACITY (CC-WL,HP ONLY)
Iron: 106 ug/dL (ref 28–170)
Saturation Ratios: 36 % — ABNORMAL HIGH (ref 10.4–31.8)
TIBC: 294 ug/dL (ref 250–450)
UIBC: 188 ug/dL (ref 148–442)

## 2023-05-15 LAB — LACTATE DEHYDROGENASE: LDH: 162 U/L (ref 98–192)

## 2023-05-15 LAB — SAVE SMEAR(SSMR), FOR PROVIDER SLIDE REVIEW

## 2023-05-15 NOTE — Progress Notes (Signed)
 Specialty Pharmacy Ongoing Clinical Assessment Note  [name] is a 83 y.o. female who is being followed by the specialty pharmacy service for RxSp Oncology   Patient's specialty medication(s) reviewed today: Venetoclax  (VENCLEXTA )   Missed doses in the last 4 weeks: 0   Patient/Caregiver did not have any additional questions or concerns.   Therapeutic benefit summary: Patient is achieving benefit   Adverse events/side effects summary: No adverse events/side effects   Patient's therapy is appropriate to: Continue    Goals Addressed             This Visit's Progress    Slow Disease Progression   On track    Patient is on track. Patient will maintain adherence         Follow up: 6 months  Malachi Screws Specialty Pharmacist

## 2023-05-15 NOTE — Progress Notes (Signed)
 Hematology and Oncology Follow Up Visit  [name] [id number] January 19, 1940 83 y.o. 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 Fouche is here today for follow-up. She is feeling much better since her hospitalization last month with C diff colitis.  She has not had any diarrhea.  She notes phlegm in her throat and will sometimes vomit as a result. She states that this has been ongoing for a year or two.  No fever, cough, rash, dizziness, SOB, chest pain, palpitations, abdominal pain or changes in bladder habits.  Mild fatigue at times.  She states that she is always cold natured.  No blood loss noted. No abnormal bruising, no petechiae.  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  No falls or syncope. She ambulates with a cane for added support.  Appetite and hydration are good. Weight is stable at 169 lbs.   ECOG Performance Status: 1 - Symptomatic but completely ambulatory  Medications:  Allergies as of 05/15/2023       Reactions   Codeine Other (See Comments)   REACTION: makes her nervous, orTylenol #3   Ibuprofen Other (See Comments)   REACTION: nervous   Meperidine  Hcl Nausea And Vomiting   Naproxen Sodium Other (See Comments)   Or Advil REACTION: nervous   Tramadol  Other (See Comments)   Insomnia    Tylenol  With Codeine #3 [acetaminophen -codeine] Nausea And Vomiting        Medication List        Accurate as of May 15, 2023 11:47 AM. If you have any questions, ask your nurse or doctor.          acetaminophen  500 MG  tablet Commonly known as: TYLENOL  Take 1 tablet (500 mg total) by mouth every 6 (six) hours as needed. What changed: reasons to take this   amLODipine  5 MG tablet Commonly known as: NORVASC  Take 1 tablet by mouth daily   aspirin  81 MG chewable tablet Chew 1 tablet (81 mg total) by mouth daily.   atenolol  100 MG tablet Commonly known as: TENORMIN  Take 1 tablet (100 mg total) by mouth daily.   atorvastatin  20 MG tablet Commonly known as: LIPITOR TAKE 1 TABLET(20 MG) BY MOUTH DAILY What changed: See the new instructions.   Azelastine  HCl 137 MCG/SPRAY Soln Place into the nose at bedtime as needed.   calcium  carbonate 1250 (500 Ca) MG tablet Commonly known as: OS-CAL - dosed in mg of elemental calcium  Take 1 tablet (1,250 mg total) by mouth 3 (three) times daily with meals.   clopidogrel  75 MG tablet Commonly known as: PLAVIX  Take 1 tablet (75 mg total) by mouth daily. What changed: when to take this   diazepam  5 MG tablet Commonly known as: VALIUM  Take 1 tablet (5 mg total) by mouth every 6 (six) hours as needed for anxiety.   fluticasone  50 MCG/ACT nasal spray Commonly known as: FLONASE  SHAKE LIQUID AND USE 2 SPRAYS IN EACH NOSTRIL DAILY What changed: See the new instructions.   FreeStyle Libre 3 Plus Sensor Misc  1 each by Does not apply route continuous. Change every 15 days.   Gvoke HypoPen  1-Pack 1 MG/0.2ML Soaj Generic drug: Glucagon  Inject 1 mg into the skin as needed (low blood sugar with impaired consciousness).   insulin  NPH Human 100 UNIT/ML injection Commonly known as: NOVOLIN N Inject 20 units into the skin every morning and inject 6 units into the skin at evening meal.   insulin  regular 100 units/mL injection Commonly known as: NOVOLIN R Inject 0.05 mLs (5 Units total) into the skin 2 (two) times daily before a meal.   INSULIN  SYRINGE .5CC/31GX5/16" 31G X 5/16" 0.5 ML Misc USE TO INJECT INSULIN  5 TIMES PER DAY   isosorbide  mononitrate 30 MG 24 hr  tablet Commonly known as: IMDUR  TAKE 1 TABLET BY MOUTH EVERY DAY   metFORMIN  500 MG tablet Commonly known as: GLUCOPHAGE  TAKE 1 TABLET(500 MG) BY MOUTH DAILY WITH SUPPER What changed: See the new instructions.   mometasone 0.1 % cream Commonly known as: ELOCON Apply 1 Application topically daily as needed (itchy ears). Use as directed   multivitamin capsule Take 1 capsule by mouth daily.  Centrum   omeprazole  20 MG capsule Commonly known as: PRILOSEC TAKE 1 CAPSULE(20 MG) BY MOUTH DAILY What changed: See the new instructions.   ondansetron  4 MG tablet Commonly known as: Zofran  Take 1 tablet (4 mg total) by mouth every 8 (eight) hours as needed for nausea or vomiting.   OneTouch Ultra test strip Generic drug: glucose blood USE AS DIRECTED THREE TIMES DAILY   Ozempic  (0.25 or 0.5 MG/DOSE) 2 MG/3ML Sopn Generic drug: Semaglutide (0.25 or 0.5MG /DOS) Inject 0.5 mg into the skin every Monday.   sulfamethoxazole -trimethoprim  800-160 MG tablet Commonly known as: BACTRIM  DS Take 1 tablet by mouth 2 (two) times daily.   Venclexta  100 MG tablet Generic drug: venetoclax  Take 1 tablet (100 mg total) by mouth daily. Tablets should be swallowed whole with a meal and a full glass of water. What changed: when to take this   VITAMIN D  PO Take 1 tablet by mouth daily.        Allergies:  Allergies  Allergen Reactions   Codeine Other (See Comments)    REACTION: makes her nervous, orTylenol #3   Ibuprofen Other (See Comments)    REACTION: nervous   Meperidine  Hcl Nausea And Vomiting   Naproxen Sodium Other (See Comments)    Or Advil  REACTION: nervous   Tramadol  Other (See Comments)    Insomnia    Tylenol  With Codeine #3 [Acetaminophen -Codeine] Nausea And Vomiting    Past Medical History, Surgical history, Social history, and Family History were reviewed and updated.  Review of Systems: All other 10 point review of systems is negative.   Physical Exam:  vitals were  not taken for this visit.   Wt Readings from Last 3 Encounters:  04/30/23 168 lb 12.8 oz (76.6 kg)  04/17/23 171 lb (77.6 kg)  04/13/23 168 lb (76.2 kg)    Ocular: Sclerae unicteric, pupils equal, round and reactive to light Ear-nose-throat: Oropharynx clear, dentition fair Lymphatic: No cervical or supraclavicular adenopathy Lungs no rales or rhonchi, good excursion bilaterally Heart regular rate and rhythm, no murmur appreciated Abd soft, nontender, positive bowel sounds MSK no focal spinal tenderness, no joint edema Neuro: non-focal, well-oriented, appropriate affect Breasts: Deferred this visit.   Lab Results  Component Value Date   WBC 5.9 04/13/2023   HGB 10.9 (L) 04/13/2023   HCT 32.1 (L) 04/13/2023   MCV 96.7 04/13/2023  PLT 198 04/13/2023   Lab Results  Component Value Date   FERRITIN 84 03/20/2023   IRON 66 03/20/2023   TIBC 335 03/20/2023   UIBC 269 03/20/2023   IRONPCTSAT 20 03/20/2023   Lab Results  Component Value Date   RETICCTPCT 2.3 03/20/2023   RBC 3.32 (L) 04/13/2023   Lab Results  Component Value Date   KPAFRELGTCHN 32.8 (H) 05/16/2021   LAMBDASER 26.4 (H) 05/16/2021   KAPLAMBRATIO 1.24 05/16/2021   Lab Results  Component Value Date   IGGSERUM 845 08/22/2022   IGA 185 08/22/2022   IGMSERUM 37 08/22/2022   Lab Results  Component Value Date   TOTALPROTELP 6.5 11/18/2020   ALBUMINELP 3.6 11/18/2020   A1GS 0.3 11/18/2020   A2GS 0.9 11/18/2020   BETS 1.0 11/18/2020   BETA2SER 6.2 02/25/2014   GAMS 0.7 11/18/2020   MSPIKE Not Observed 11/18/2020   SPEI * 02/25/2014     Chemistry      Component Value Date/Time   NA 141 04/13/2023 1523   NA 147 (H) 11/03/2016 1018   NA 143 08/30/2016 0954   K 4.9 04/13/2023 1523   K 3.9 11/03/2016 1018   K 4.5 08/30/2016 0954   CL 115 (H) 04/13/2023 1523   CL 110 (H) 11/03/2016 1018   CO2 19 (L) 04/13/2023 1523   CO2 27 11/03/2016 1018   CO2 24 08/30/2016 0954   BUN 15 04/13/2023 1523   BUN 25  (H) 11/03/2016 1018   BUN 20.5 08/30/2016 0954   CREATININE 1.13 (H) 04/13/2023 1523   CREATININE 1.4 (H) 08/30/2016 0954      Component Value Date/Time   CALCIUM  7.6 (L) 04/13/2023 1523   CALCIUM  10.1 11/03/2016 1018   CALCIUM  9.8 08/30/2016 0954   ALKPHOS 48 04/08/2023 0532   ALKPHOS 112 (H) 11/03/2016 1018   ALKPHOS 113 08/30/2016 0954   AST 29 04/08/2023 0532   AST 17 03/20/2023 1003   AST 23 08/30/2016 0954   ALT 30 04/08/2023 0532   ALT 16 03/20/2023 1003   ALT 29 11/03/2016 1018   ALT 20 08/30/2016 0954   BILITOT 0.6 04/08/2023 0532   BILITOT 0.4 03/20/2023 1003   BILITOT 0.38 08/30/2016 0954       Impression and Plan: Ms. Zalar is a very pleasant 83 yo caucasian female with CLL.  So far, we have not had any negative prognostic markers.  She does have the 13q-chromosomal abnormality.   She continues to tolerate Venetoclax  nicely. Flow cytometry pending. Continue same regimen for now.  She also has DCIS of the left breast and has undergone multiple surgeries. She had a excision after her positive margin.  Thankfully, the reexcision did not show any malignancy.  She did not wish to have any radiation therapy for this. Follow-up in 2 months.  Kennard Pea, NP 5/13/202511:47 AM

## 2023-05-15 NOTE — Progress Notes (Signed)
 Specialty Pharmacy Refill Coordination Note  Zip [name] is a 83 y.o. female contacted today regarding refills of specialty medication(s) Venetoclax  (VENCLEXTA )   Patient requested Delivery   Delivery date: 05/18/23   Verified address: [address] [place] [address]   Medication will be filled on 05.15.25.

## 2023-05-16 ENCOUNTER — Encounter: Payer: Self-pay | Admitting: Internal Medicine

## 2023-05-16 DIAGNOSIS — Z961 Presence of intraocular lens: Secondary | ICD-10-CM | POA: Diagnosis not present

## 2023-05-16 DIAGNOSIS — H43813 Vitreous degeneration, bilateral: Secondary | ICD-10-CM | POA: Diagnosis not present

## 2023-05-16 DIAGNOSIS — Q141 Congenital malformation of retina: Secondary | ICD-10-CM | POA: Diagnosis not present

## 2023-05-16 DIAGNOSIS — H35432 Paving stone degeneration of retina, left eye: Secondary | ICD-10-CM | POA: Diagnosis not present

## 2023-05-16 DIAGNOSIS — E113313 Type 2 diabetes mellitus with moderate nonproliferative diabetic retinopathy with macular edema, bilateral: Secondary | ICD-10-CM | POA: Diagnosis not present

## 2023-05-16 LAB — SURGICAL PATHOLOGY

## 2023-05-16 LAB — HM DIABETES EYE EXAM

## 2023-05-17 ENCOUNTER — Other Ambulatory Visit (HOSPITAL_COMMUNITY): Payer: Self-pay

## 2023-05-17 ENCOUNTER — Other Ambulatory Visit: Payer: Self-pay

## 2023-05-17 ENCOUNTER — Ambulatory Visit: Payer: Self-pay | Admitting: Hematology & Oncology

## 2023-05-17 LAB — FLOW CYTOMETRY

## 2023-05-17 NOTE — Telephone Encounter (Signed)
 Called and informed patient of results per MD. Patient very appreciative of call and denies any questions or concerns right now.

## 2023-05-17 NOTE — Telephone Encounter (Signed)
-----   Message from Ivor Mars sent at 05/17/2023  5:43 AM EDT ----- Please call and let her know that the blood test show that she is in remission.  This is fantastic.  She can now stop the venetoclax .  I am so happy for her.  She has done such a good job.  Twilla Galea

## 2023-05-17 NOTE — Progress Notes (Signed)
 Patient called [facility] to report that she is in remission! Patient no longer needs medication. Disenrolling patient from [facility].

## 2023-05-18 ENCOUNTER — Encounter: Payer: Self-pay | Admitting: Internal Medicine

## 2023-05-18 ENCOUNTER — Ambulatory Visit: Admitting: Internal Medicine

## 2023-05-18 ENCOUNTER — Ambulatory Visit (HOSPITAL_BASED_OUTPATIENT_CLINIC_OR_DEPARTMENT_OTHER)
Admission: RE | Admit: 2023-05-18 | Discharge: 2023-05-18 | Disposition: A | Discharge status: Home / Self Care | Source: Ambulatory Visit | Attending: Internal Medicine | Admitting: Internal Medicine

## 2023-05-18 VITALS — BP 140/70 | HR 69 | Temp 97.9°F | Resp 18 | Ht 65.0 in | Wt 170.8 lb

## 2023-05-18 DIAGNOSIS — I152 Hypertension secondary to endocrine disorders: Secondary | ICD-10-CM | POA: Diagnosis not present

## 2023-05-18 DIAGNOSIS — E7800 Pure hypercholesterolemia, unspecified: Secondary | ICD-10-CM | POA: Diagnosis not present

## 2023-05-18 DIAGNOSIS — E1159 Type 2 diabetes mellitus with other circulatory complications: Secondary | ICD-10-CM

## 2023-05-18 DIAGNOSIS — M160 Bilateral primary osteoarthritis of hip: Secondary | ICD-10-CM | POA: Diagnosis not present

## 2023-05-18 DIAGNOSIS — M25552 Pain in left hip: Secondary | ICD-10-CM | POA: Insufficient documentation

## 2023-05-18 LAB — LIPID PANEL
Cholesterol: 124 mg/dL (ref 0–200)
HDL: 41.5 mg/dL (ref >39.00)
LDL Cholesterol: 38 mg/dL (ref 0–99)
NonHDL: 82.03
Total CHOL/HDL Ratio: 3
Triglycerides: 220 mg/dL — ABNORMAL HIGH (ref 0.0–149.0)
VLDL: 44 mg/dL — ABNORMAL HIGH (ref 0.0–40.0)

## 2023-05-18 MED ORDER — AMLODIPINE BESYLATE 10 MG PO TABS
10.0000 mg | ORAL_TABLET | Freq: Every day | ORAL | 6 refills | Status: DC
Start: 2023-05-18 — End: 2023-11-09

## 2023-05-18 NOTE — Patient Instructions (Signed)
  Increase amlodipine  from 5 mg daily to 10 mg daily.  We sent a prescription.  Check the  blood pressure regularly Blood pressure goal:  between 110/65 and  135/85. If it is consistently higher or lower, let me know  We are referring you to orthopedic doctor regards the left hip pain. Tylenol   500 mg OTC 2 tabs a day every 8 hours as needed for pain Stop by the first floor and get a x-ray  GO TO THE LAB : Get the blood work     Next office visit for a checkup in 6 weeks. Please make an appointment before you leave today

## 2023-05-18 NOTE — Progress Notes (Signed)
 Subjective:    Patient ID: [name], female    DOB: Feb 13, 1940, 83 y.o.   MRN: [id number]  DOS:  05/18/2023 Type of visit - description: follow up  Here for follow-up. History of recent C. difficile: Denies fever, diarrhea or blood in the stools. Had LUTS, see LOV currently with no dysuria or gross hematuria. Denies chest pain or difficulty breathing.  Her main concern today is pain when she flex her left hip. The pain is located at the anterior hip and radiates to the anterior thigh When asked, admits to back pain for several months, mostly in the left but often times bilateral.  Back pain increased with standing and walking. Denies paresthesias.  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   Gastritis    GERD (gastroesophageal reflux disease)    Headache(784.0)    History of blood transfusion    "I"ve had 20 some; thru birth of children, loss of blood, last 2 were in ~ 1999 before my cancer surgery" (09/16/2015)   History of hiatal hernia    History of shingles    Hx of colonic polyps    Hyperlipidemia    Hypertension    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HCC) 2000   Myocardial infarction (HCC)    1997   Pneumonia    X 1   Postoperative hematoma involving circulatory system following cardiac catheterization 09/17/2015   S/P cardiac cath 09/16/15 09/17/2015   Type  II diabetes mellitus (HCC)    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[facility])    as a child   Venous insufficiency     Past Surgical History:  Procedure Laterality Date   BREAST BIOPSY Left 03/10/2022   MM LT BREAST BX W LOC DEV 1ST LESION IMAGE BX SPEC STEREO GUIDE 03/10/2022 [facility]   BREAST BIOPSY  05/01/2022   MM LT RADIOACTIVE SEED LOC MAMMO GUIDE 05/01/2022 [facility]   BREAST LUMPECTOMY WITH RADIOACTIVE SEED LOCALIZATION Left 05/02/2022   Procedure: LEFT BREAST LUMPECTOMY WITH RADIOACTIVE SEED LOCALIZATION;  Surgeon: Sim Dryer, MD;  Location: [facility] OR;  Service: General;  Laterality: Left;   CARDIAC CATHETERIZATION  1997; 2008; 09/16/2015   CARDIAC CATHETERIZATION N/A 09/16/2015   Procedure: Right/Left Heart Cath and Coronary/Graft Angiography;  Surgeon: Avanell Leigh, MD;  Location: [facility] INVASIVE CV LAB;  Service: Cardiovascular;  Laterality: N/A;   CESAREAN SECTION  1966   COLONOSCOPY     CORONARY ARTERY BYPASS GRAFT  1997   CABG X5   EYE SURGERY     2 laser surgeries on left eye with implant   EYE SURGERY Bilateral    cataracts   FEMUR IM NAIL Left 08/28/2018   Procedure: INTRAMEDULLARY (IM) NAIL FEMORAL;  Surgeon: Adonica Hoose, MD;  Location: [facility] ORS;  Service: Orthopedics;  Laterality: Left;   FRACTURE SURGERY     HERNIA REPAIR     LAPAROSCOPIC ASSISTED VENTRAL HERNIA REPAIR  09/2008    with incarcerated colon;  Dr. 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 Marlow Sin   SHOULDER ARTHROSCOPY W/ ROTATOR CUFF REPAIR Right 1997   SMALL INTESTINE  SURGERY     TONSILLECTOMY  1960s   TOTAL KNEE ARTHROPLASTY WITH HARDWARE REMOVAL Left 12/1994   (infex, hardware removed )- not replacement per patient   TUBAL LIGATION  1966    Current Outpatient Medications  Medication Instructions   acetaminophen  (TYLENOL ) 500 mg, Oral, Every 6 hours PRN   amLODipine  (NORVASC ) 5 MG tablet Take 1 tablet by mouth daily   aspirin  81 mg, Oral, Daily   atenolol  (TENORMIN ) 100 mg, Oral, Daily   atorvastatin  (LIPITOR) 20 MG tablet TAKE 1 TABLET(20 MG) BY MOUTH DAILY   Azelastine  HCl 137 MCG/SPRAY SOLN At bedtime PRN   calcium  carbonate (OS-CAL - DOSED IN MG OF ELEMENTAL CALCIUM ) 1,250 mg, Oral, 3 times daily with meals   clopidogrel  (PLAVIX ) 75 mg, Oral, Daily   Continuous Glucose Sensor (FREESTYLE LIBRE 3 PLUS SENSOR) MISC 1 each, Does not apply, Continuous, Change every 15 days.   diazepam  (VALIUM ) 5 mg, Oral, Every 6 hours PRN   fluticasone  (FLONASE ) 50 MCG/ACT nasal spray SHAKE LIQUID AND USE 2 SPRAYS IN EACH NOSTRIL DAILY   glucose blood (ONETOUCH ULTRA) test strip USE AS DIRECTED THREE TIMES DAILY   Gvoke HypoPen  1-Pack 1 mg, Subcutaneous, As needed   insulin  NPH Human (NOVOLIN N) 100 UNIT/ML injection Inject 20 units into the skin every morning and inject 6 units into the skin at evening meal.   insulin  regular (NOVOLIN R) 5 Units, Subcutaneous, 2 times daily before meals   Insulin  Syringe-Needle U-100 (INSULIN  SYRINGE .5CC/31GX5/16") 31G X 5/16" 0.5 ML MISC USE TO INJECT INSULIN  5 TIMES PER DAY   isosorbide  mononitrate (IMDUR ) 30 mg, Oral, Daily   metFORMIN  (GLUCOPHAGE ) 500 MG tablet TAKE 1 TABLET(500 MG) BY MOUTH DAILY WITH SUPPER   mometasone (ELOCON) 0.1 % cream 1 Application, Daily PRN   Multiple Vitamin (MULTIVITAMIN) capsule 1 capsule, Daily   omeprazole  (PRILOSEC) 20 MG capsule TAKE 1 CAPSULE(20 MG) BY MOUTH DAILY   ondansetron  (ZOFRAN ) 4 mg, Oral, Every 8 hours PRN   Ozempic  (0.25 or 0.5 MG/DOSE) 0.5 mg, Subcutaneous, Every Mon    sulfamethoxazole -trimethoprim  (BACTRIM  DS) 800-160 MG tablet 1 tablet, Oral, 2 times daily   Venclexta  100 mg, Oral, Daily, Tablets should be swallowed whole with a meal and a full glass of water.   VITAMIN D  PO 1 tablet, Daily       Objective:   Physical Exam BP (!) 140/70 (BP Location: Right Arm, Patient Position: Sitting, Cuff Size: Large)   Pulse 69   Temp 97.9 F (36.6 C) (Oral)   Resp 18   Ht 5\' 5"  (1.651 m)   Wt 170 lb 12.8 oz (77.5 kg)  SpO2 98%   BMI 28.42 kg/m  General:   Well developed, NAD, BMI noted. HEENT:  Normocephalic . Face symmetric, atraumatic Lungs:  CTA B Normal respiratory effort, no intercostal retractions, no accessory muscle use. Heart: RRR,  no murmur.  Lower extremities: no pretibial edema bilaterally MSK: Right leg: ROM hip normal Left leg: Passive and  active hip flexion cause pain. Motor lower extremities: Symmetric Skin: Not pale. Not jaundice Neurologic:  alert & oriented X3.  Speech normal, gait limited by pain, uses a cane. Psych--  Cognition and judgment appear intact.  Cooperative with normal attention span and concentration.  Behavior appropriate. No anxious or depressed appearing.       Assessment     Assessment DM , +retinopathy, per endo- HTN Hyperlipidemia CKD: creat ~1.4  (GFR ~ 38) US  09-2019: No obstruction, bilateral cortical thinning and a small renal cysts Depression/anxiety: tranxene  qhs prn (takes rarely) MSK: -DJD  - Osteoporosis ---T score -0.8 on December 2017, h/o a foot FX d/t walking years ago --- (L) Hip Fx, 08/2018: started fosamax , switch to Prolia  d/t CKD and diff swallowing, first dose 12-2019 Hem/Onc: Dr Maria Shiner  -CLL -iron deficiency anemia: IV iron 2018 -B12 def -L DCIS, surgery 2024. CAD, Dr Katheryne Pane MI 31 >> CABG, cath 12-13-2006, myoview  2012 no ischemic CP: Stress test 08/05/2015, indeterminate risk study, + lateral ischemia: Cardiac catheterization 09/16/2015: Rx medical Venous  insuff GI:  Dr Leonia Raman ---GERD, IBS, h/o Gastritis ---H/o ulcerative colitis as a child H/o shingles  BCC Dr Fleurette Humbles, bx 04-2015, MOH's 06-2016   PLAN DM: sees endo, last  A1c 6.2. HTN: Currently on amlodipine , atenolol , Imdur . Off irbersartan No ambulatory BPs, BP today 140/70.  Recently BP has been in the 150s.  Last BMP is stable Plan: Increase amlodipine  from 5 to 10 mg, consider restart  irbesartan . High cholesterol: On atorvastatin  20 mg, check FLP, last LFTs normal CLL, DCIS left breast: LOV oncology 05/15/2023.  Next visit by 07-2023 C. difficile colitis: Resolved L hip: new, pain driver suspected to be the hip but could also be lumbar DJD.  No recent injury or fall, history of cancer.  Plan p : X-ray, Tylenol , refer to Ortho. RTC 6 months

## 2023-05-19 NOTE — Assessment & Plan Note (Signed)
 DM: sees endo, last  A1c 6.2. HTN: Currently on amlodipine , atenolol , Imdur . Off irbersartan No ambulatory BPs, BP today 140/70.  Recently BP has been in the 150s.  Last BMP is stable Plan: Increase amlodipine  from 5 to 10 mg, consider restart  irbesartan . High cholesterol: On atorvastatin  20 mg, check FLP, last LFTs normal CLL, DCIS left breast: LOV oncology 05/15/2023.  Next visit by 07-2023 C. difficile colitis: Resolved L hip: new, pain driver suspected to be the hip but could also be lumbar DJD.  No recent injury or fall, history of cancer.  Plan p : X-ray, Tylenol , refer to Ortho. RTC 6 months

## 2023-05-22 ENCOUNTER — Ambulatory Visit: Payer: Self-pay | Admitting: Internal Medicine

## 2023-06-18 ENCOUNTER — Telehealth: Payer: Self-pay

## 2023-06-18 NOTE — Telephone Encounter (Signed)
 Prolia  VOB initiated via [email or web address]  Next Prolia  inj DUE: 07/18/23

## 2023-06-18 NOTE — Telephone Encounter (Signed)
 Pt ready for scheduling for PROLIA  on or after : 07/18/23  Option# 1: Buy/Bill (Office supplied medication)  Out-of-pocket cost due at time of clinic visit: $0  Number of injection/visits approved: ---  Primary: MEDICARE Prolia  co-insurance: 0% Admin fee co-insurance: 0%  Secondary: UHC A[employer]-MEDSUP Prolia  co-insurance:  Admin fee co-insurance:   Medical Benefit Details: Date Benefits were checked: 06/18/23 Deductible: $257 Met of $257 Required/ Coinsurance: 0%/ Admin Fee: 0%  Prior Auth: N/A PA# Expiration Date:   # of doses approved: ----------------------------------------------------------------------- Option# 2- Med Obtained from pharmacy:  Pharmacy benefit: Copay $--- (Paid to pharmacy) Admin Fee: --- (Pay at clinic)  Prior Auth: --- PA# Expiration Date:   # of doses approved:   If patient wants fill through the pharmacy benefit please send prescription to: ---, and include estimated need by date in rx notes. Pharmacy will ship medication directly to the office.  Patient NOT eligible for Prolia  Copay Card. Copay Card can make patient's cost as little as $25. Link to apply: [email or web address]://[email or web address]  ** This summary of benefits is an estimation of the patient's out-of-pocket cost. Exact cost may very based on individual plan coverage.

## 2023-06-18 NOTE — Telephone Encounter (Signed)
 [name]

## 2023-06-20 ENCOUNTER — Telehealth: Payer: Self-pay | Admitting: Internal Medicine

## 2023-06-20 NOTE — Telephone Encounter (Signed)
 Pt scheduled for 07/23/23

## 2023-06-20 NOTE — Telephone Encounter (Signed)
 Copied from CRM [id number]. Topic: General - Call Back - No Documentation >> Jun 20, 2023  2:08 PM Leah C wrote: Reason for CRM: Patient called in to ask if Prolia  shot can be taken the same day as her appointment on the 27th. Patient's contact is [phone]. Patient is returning call to have it scheduled.

## 2023-06-25 ENCOUNTER — Other Ambulatory Visit: Payer: Self-pay

## 2023-06-25 MED ORDER — METFORMIN HCL 500 MG PO TABS
500.0000 mg | ORAL_TABLET | Freq: Every day | ORAL | 1 refills | Status: AC
Start: 2023-06-25 — End: ?

## 2023-06-26 ENCOUNTER — Other Ambulatory Visit: Payer: Self-pay

## 2023-06-26 DIAGNOSIS — Z794 Long term (current) use of insulin: Secondary | ICD-10-CM

## 2023-06-26 MED ORDER — OZEMPIC (0.25 OR 0.5 MG/DOSE) 2 MG/3ML ~~LOC~~ SOPN: 0.5000 mg | PEN_INJECTOR | SUBCUTANEOUS | 1 refills | Status: AC

## 2023-06-29 ENCOUNTER — Ambulatory Visit (INDEPENDENT_AMBULATORY_CARE_PROVIDER_SITE_OTHER): Admitting: Internal Medicine

## 2023-06-29 ENCOUNTER — Encounter: Payer: Self-pay | Admitting: Internal Medicine

## 2023-06-29 VITALS — BP 136/80 | HR 54 | Temp 98.2°F | Resp 16 | Ht 65.0 in | Wt 174.2 lb

## 2023-06-29 DIAGNOSIS — I10 Essential (primary) hypertension: Secondary | ICD-10-CM | POA: Diagnosis not present

## 2023-06-29 DIAGNOSIS — M25551 Pain in right hip: Secondary | ICD-10-CM

## 2023-06-29 DIAGNOSIS — K625 Hemorrhage of anus and rectum: Secondary | ICD-10-CM

## 2023-06-29 NOTE — Patient Instructions (Signed)
 Continue taking over-the-counter calcium .  We are referring you to the orthopedic doctor for the pain on the right hip.  Continue checking your blood pressures regularly.  For leg swelling:  Do not stay sit down or stand up for too long.  Watch your salt intake. If you develop leg swelling, elevate your legs for at least an hour.    Next visit in 3 months, please make arrangements

## 2023-06-29 NOTE — Progress Notes (Unsigned)
 Subjective:    Patient ID: [name], female    DOB: 07/18/1940, 83 y.o.   MRN: [id number]  DOS:  06/29/2023 Type of visit - description: Routine follow-up  Chronic medical problems addressed. Last visit complaining of left hip pain, that is better. Today complaining of chronic right hip pain.  Uses a cane.  Recently was seen with rectal bleeding, that is completely resolved.  No nausea or vomiting.  No diarrhea.  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   Clotting disorder (HCC)    Depression    DJD (degenerative joint disease)    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   Gastritis    GERD (gastroesophageal reflux disease)    Headache(784.0)    History of blood transfusion    Ive had 20 some; thru birth of children, loss of blood, last 2 were in ~ 1999 before my cancer surgery (09/16/2015)   History of hiatal hernia    History of shingles    Hx of colonic polyps    Hyperlipidemia    Hypertension    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HCC) 2000   Myocardial infarction (HCC)    1997   Pneumonia    X 1   Postoperative hematoma involving circulatory system following cardiac catheterization 09/17/2015   S/P cardiac cath 09/16/15 09/17/2015   Type II diabetes mellitus (HCC)    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[facility])    as a child   Venous insufficiency     Past Surgical History:  Procedure Laterality Date   BREAST BIOPSY Left 03/10/2022   MM LT BREAST BX W LOC DEV 1ST LESION IMAGE BX SPEC STEREO  GUIDE 03/10/2022 [facility]   BREAST BIOPSY  05/01/2022   MM LT RADIOACTIVE SEED LOC MAMMO GUIDE 05/01/2022 [facility]   BREAST LUMPECTOMY WITH RADIOACTIVE SEED LOCALIZATION Left 05/02/2022   Procedure: LEFT BREAST LUMPECTOMY WITH RADIOACTIVE SEED LOCALIZATION;  Surgeon: Vanderbilt Ned, MD;  Location: [facility] OR;  Service: General;  Laterality: Left;   CARDIAC CATHETERIZATION  1997; 2008; 09/16/2015   CARDIAC CATHETERIZATION N/A 09/16/2015   Procedure: Right/Left Heart Cath and Coronary/Graft Angiography;  Surgeon: Dorn JINNY Court, MD;  Location: [facility]IVE [facility];  Service: Cardiovascular;  Laterality: N/A;   CESAREAN SECTION  1966   COLONOSCOPY     CORONARY ARTERY BYPASS GRAFT  1997   CABG X5   EYE SURGERY     2 laser surgeries on left eye with implant   EYE SURGERY Bilateral    cataracts   FEMUR IM NAIL Left 08/28/2018   Procedure: INTRAMEDULLARY (IM) NAIL FEMORAL;  Surgeon: Fidel Rogue, MD;  Location: [facility] ORS;  Service: Orthopedics;  Laterality: Left;   FRACTURE SURGERY     HERNIA REPAIR     LAPAROSCOPIC ASSISTED VENTRAL HERNIA REPAIR  09/2008    with incarcerated colon;  Dr. Ethyl   MOHS SURGERY  06/2016   BCC   PATELLA  FRACTURE SURGERY Left 11/1994   crushed my knee; put in a plate & 6 screws   POLYPECTOMY     RE-EXCISION OF BREAST LUMPECTOMY Left 05/18/2022   Procedure: RE-EXCISION OF LEFT BREAST LUMPECTOMY;  Surgeon: Vanderbilt Ned, MD;  Location: [facility];  Service: General;  Laterality: Left;  60 MIN ROOM 8   REFRACTIVE SURGERY Left 07/30/2015   resection of small bowel carcinoma  06/1998   Dr. Gretel   SHOULDER ARTHROSCOPY W/ ROTATOR CUFF REPAIR Right 1997   SMALL INTESTINE SURGERY     TONSILLECTOMY  1960s   TOTAL KNEE ARTHROPLASTY WITH HARDWARE REMOVAL Left 12/1994   (infex, hardware removed )- not replacement per patient   TUBAL LIGATION  1966    Current Outpatient Medications  Medication Instructions   acetaminophen  (TYLENOL ) 500  mg, Oral, Every 6 hours PRN   amLODipine  (NORVASC ) 10 mg, Oral, Daily   aspirin  81 mg, Oral, Daily   atenolol  (TENORMIN ) 100 mg, Oral, Daily   atorvastatin  (LIPITOR) 20 MG tablet TAKE 1 TABLET(20 MG) BY MOUTH DAILY   Azelastine  HCl 137 MCG/SPRAY SOLN At bedtime PRN   calcium  carbonate (OS-CAL - DOSED IN MG OF ELEMENTAL CALCIUM ) 1,250 mg, Oral, 3 times daily with meals   clopidogrel  (PLAVIX ) 75 mg, Oral, Daily   Continuous Glucose Sensor (FREESTYLE LIBRE 3 PLUS SENSOR) MISC 1 each, Does not apply, Continuous, Change every 15 days.   diazepam  (VALIUM ) 5 mg, Oral, Every 6 hours PRN   fluticasone  (FLONASE ) 50 MCG/ACT nasal spray SHAKE LIQUID AND USE 2 SPRAYS IN EACH NOSTRIL DAILY   glucose blood (ONETOUCH ULTRA) test strip USE AS DIRECTED THREE TIMES DAILY   Gvoke HypoPen  1-Pack 1 mg, Subcutaneous, As needed   insulin  NPH Human (NOVOLIN N) 100 UNIT/ML injection Inject 20 units into the skin every morning and inject 6 units into the skin at evening meal.   insulin  regular (NOVOLIN R) 5 Units, Subcutaneous, 2 times daily before meals   Insulin  Syringe-Needle U-100 (INSULIN  SYRINGE .5CC/31GX5/16) 31G X 5/16 0.5 ML MISC USE TO INJECT INSULIN  5 TIMES PER DAY   isosorbide  mononitrate (IMDUR ) 30 mg, Oral, Daily   metFORMIN  (GLUCOPHAGE ) 500 mg, Oral, Daily with supper   mometasone (ELOCON) 0.1 % cream 1 Application, Daily PRN   Multiple Vitamin (MULTIVITAMIN) capsule 1 capsule, Daily   omeprazole  (PRILOSEC) 20 MG capsule TAKE 1 CAPSULE(20 MG) BY MOUTH DAILY   ondansetron  (ZOFRAN ) 4 mg, Oral, Every 8 hours PRN   [START ON 07/02/2023] Ozempic  (0.25 or 0.5 MG/DOSE) 0.5 mg, Subcutaneous, Every Mon   Venclexta  100 mg, Oral, Daily, Tablets should be swallowed whole with a meal and a full glass of water.   VITAMIN D  PO 1 tablet, Daily       Objective:   Physical Exam BP 136/80   Pulse (!) 54   Temp 98.2 F (36.8 C) (Oral)   Resp 16   Ht 5' 5 (1.651 m)   Wt 174 lb 4 oz (79 kg)   SpO2 96%   BMI  29.00 kg/m  General:   Well developed, NAD, BMI noted. HEENT:  Normocephalic . Face symmetric, atraumatic Lungs:  CTA B Normal respiratory effort, no intercostal retractions, no accessory muscle use. Heart: RRR,  no murmur.  Lower extremities: Trace pretibial edema bilaterally  Skin: Not pale. Not jaundice Neurologic:  alert & oriented X3.  Speech normal, gait assisted by a cane.  Gait and transferring limited by DJD. Psych--  Cognition and judgment appear intact.  Cooperative with normal attention span and concentration.  Behavior appropriate. No anxious or depressed appearing.      Assessment      Assessment DM , +retinopathy, per endo- HTN Hyperlipidemia CKD: creat ~1.4  (GFR ~ 38) US  09-2019: No obstruction, bilateral cortical thinning and a small renal cysts Depression/anxiety: tranxene  qhs prn (takes rarely) MSK: -DJD  - Osteoporosis ---T score -0.8 on December 2017, h/o a foot FX d/t walking years ago --- (L) Hip Fx, 08/2018: started fosamax , switch to Prolia  d/t CKD and diff swallowing, first dose 12-2019 Hem/Onc: Dr Timmy  -CLL -iron deficiency anemia: IV iron 2018 -B12 def -L DCIS, surgery 2024. CAD, Dr Court MI 35 >> CABG, cath 12-13-2006, myoview  2012 no ischemic CP: Stress test 08/05/2015, indeterminate risk study, + lateral ischemia: Cardiac catheterization 09/16/2015: Rx medical Venous insuff GI:  Dr Shila ---GERD, IBS, h/o Gastritis ---H/o ulcerative colitis as a child H/o shingles  BCC Dr Ivin, bx 04-2015, MOH's 06-2016   PLAN DM: Per Endo HTN: Ambulatory BPs 130s/60s.  Last BMP stable, continue present care. LE edema: Occasional lower extremity edema, recommend low-salt diet, leg elevation.  Reassess periodically MSK: L hip pain, see LOV, x-ray no acute, back pain resolved. R hip pain: This is a chronic issue, slightly worse, likely due to DJD, referred to Ortho. High cholesterol: On atorvastatin , last LDL satisfactory Rectal bleeding:  See note from 04/13/2023, was referred to GI, that has not been scheduled.  Last colonoscopy 2019, + polyps.  Symptoms are largely resolved, hemoglobin is stable.  We agreed on observation. RTC 3 months  5-16 DM: sees endo, last  A1c 6.2. HTN: Currently on amlodipine , atenolol , Imdur . Off irbersartan No ambulatory BPs, BP today 140/70.  Recently BP has been in the 150s.  Last BMP is stable Plan: Increase amlodipine  from 5 to 10 mg, consider restart  irbesartan . High cholesterol: On atorvastatin  20 mg, check FLP, last LFTs normal CLL, DCIS left breast: LOV oncology 05/15/2023.  Next visit by 07-2023 C. difficile colitis: Resolved L hip: new, pain driver suspected to be the hip but could also be lumbar DJD.  No recent injury or fall, history of cancer.  Plan p : X-ray, Tylenol , refer to Ortho. RTC 6 months

## 2023-06-30 NOTE — Assessment & Plan Note (Signed)
 DM: Per Endo HTN: Ambulatory BPs 130s/60s.  Last BMP stable, continue present care. LE edema: Occasional lower extremity edema, recommend low-salt diet, leg elevation.  Reassess periodically MSK: L hip pain, see LOV, x-ray no acute, back pain resolved. R hip pain: This is a chronic issue, slightly worse, likely due to DJD, refer to Ortho. High cholesterol: On atorvastatin , last LDL satisfactory Rectal bleeding: See note from 04/13/2023, was referred to GI, that has not been scheduled.  Last colonoscopy 2019, + polyps.  Symptoms are largely resolved, hemoglobin is stable.  We agreed on observation. RTC 3 months

## 2023-07-12 ENCOUNTER — Other Ambulatory Visit: Payer: Self-pay

## 2023-07-17 ENCOUNTER — Inpatient Hospital Stay: Attending: Hematology & Oncology

## 2023-07-17 ENCOUNTER — Inpatient Hospital Stay (HOSPITAL_BASED_OUTPATIENT_CLINIC_OR_DEPARTMENT_OTHER): Admitting: Hematology & Oncology

## 2023-07-17 ENCOUNTER — Encounter: Payer: Self-pay | Admitting: Hematology & Oncology

## 2023-07-17 VITALS — BP 159/64 | HR 66 | Temp 97.9°F | Resp 20 | Ht 65.0 in | Wt 175.8 lb

## 2023-07-17 DIAGNOSIS — N1832 Chronic kidney disease, stage 3b: Secondary | ICD-10-CM

## 2023-07-17 DIAGNOSIS — Q999 Chromosomal abnormality, unspecified: Secondary | ICD-10-CM | POA: Insufficient documentation

## 2023-07-17 DIAGNOSIS — C9110 Chronic lymphocytic leukemia of B-cell type not having achieved remission: Secondary | ICD-10-CM

## 2023-07-17 DIAGNOSIS — D508 Other iron deficiency anemias: Secondary | ICD-10-CM

## 2023-07-17 DIAGNOSIS — M7989 Other specified soft tissue disorders: Secondary | ICD-10-CM | POA: Insufficient documentation

## 2023-07-17 DIAGNOSIS — Z87440 Personal history of urinary (tract) infections: Secondary | ICD-10-CM | POA: Diagnosis not present

## 2023-07-17 DIAGNOSIS — Z85038 Personal history of other malignant neoplasm of large intestine: Secondary | ICD-10-CM | POA: Insufficient documentation

## 2023-07-17 DIAGNOSIS — D509 Iron deficiency anemia, unspecified: Secondary | ICD-10-CM | POA: Insufficient documentation

## 2023-07-17 DIAGNOSIS — I2510 Atherosclerotic heart disease of native coronary artery without angina pectoris: Secondary | ICD-10-CM

## 2023-07-17 DIAGNOSIS — K909 Intestinal malabsorption, unspecified: Secondary | ICD-10-CM

## 2023-07-17 DIAGNOSIS — Z9221 Personal history of antineoplastic chemotherapy: Secondary | ICD-10-CM | POA: Diagnosis not present

## 2023-07-17 DIAGNOSIS — D0512 Intraductal carcinoma in situ of left breast: Secondary | ICD-10-CM

## 2023-07-17 LAB — CBC WITH DIFFERENTIAL (CANCER CENTER ONLY)
Abs Immature Granulocytes: 0.04 K/uL (ref 0.00–0.07)
Basophils Absolute: 0 K/uL (ref 0.0–0.1)
Basophils Relative: 1 %
Eosinophils Absolute: 0.2 K/uL (ref 0.0–0.5)
Eosinophils Relative: 3 %
HCT: 32.6 % — ABNORMAL LOW (ref 36.0–46.0)
Hemoglobin: 10.9 g/dL — ABNORMAL LOW (ref 12.0–15.0)
Immature Granulocytes: 1 %
Lymphocytes Relative: 26 %
Lymphs Abs: 1.7 K/uL (ref 0.7–4.0)
MCH: 32.6 pg (ref 26.0–34.0)
MCHC: 33.4 g/dL (ref 30.0–36.0)
MCV: 97.6 fL (ref 80.0–100.0)
Monocytes Absolute: 0.6 K/uL (ref 0.1–1.0)
Monocytes Relative: 10 %
Neutro Abs: 3.9 K/uL (ref 1.7–7.7)
Neutrophils Relative %: 59 %
Platelet Count: 200 K/uL (ref 150–400)
RBC: 3.34 MIL/uL — ABNORMAL LOW (ref 3.87–5.11)
RDW: 12.6 % (ref 11.5–15.5)
WBC Count: 6.5 K/uL (ref 4.0–10.5)
nRBC: 0 % (ref 0.0–0.2)

## 2023-07-17 LAB — CMP (CANCER CENTER ONLY)
ALT: 13 U/L (ref 0–44)
AST: 17 U/L (ref 15–41)
Albumin: 4.1 g/dL (ref 3.5–5.0)
Alkaline Phosphatase: 72 U/L (ref 38–126)
Anion gap: 9 (ref 5–15)
BUN: 17 mg/dL (ref 8–23)
CO2: 26 mmol/L (ref 22–32)
Calcium: 10.3 mg/dL (ref 8.9–10.3)
Chloride: 107 mmol/L (ref 98–111)
Creatinine: 1.43 mg/dL — ABNORMAL HIGH (ref 0.44–1.00)
GFR, Estimated: 36 mL/min — ABNORMAL LOW (ref >60)
Glucose, Bld: 157 mg/dL — ABNORMAL HIGH (ref 70–99)
Potassium: 4 mmol/L (ref 3.5–5.1)
Sodium: 142 mmol/L (ref 135–145)
Total Bilirubin: 0.4 mg/dL (ref 0.0–1.2)
Total Protein: 6.6 g/dL (ref 6.5–8.1)

## 2023-07-17 LAB — SAVE SMEAR(SSMR), FOR PROVIDER SLIDE REVIEW

## 2023-07-17 LAB — FERRITIN: Ferritin: 453 ng/mL — ABNORMAL HIGH (ref 11–307)

## 2023-07-17 LAB — IRON AND IRON BINDING CAPACITY (CC-WL,HP ONLY)
Iron: 82 ug/dL (ref 28–170)
Saturation Ratios: 30 % (ref 10.4–31.8)
TIBC: 270 ug/dL (ref 250–450)
UIBC: 188 ug/dL (ref 148–442)

## 2023-07-17 LAB — LACTATE DEHYDROGENASE: LDH: 178 U/L (ref 98–192)

## 2023-07-17 NOTE — Progress Notes (Signed)
 Hematology and Oncology Follow Up Visit  [name] [id number] 08/29/1940 83 y.o. 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  I do that we last saw her back in May.  Before that, she had she was hospitalized because of UTI and C. difficile colitis.  She was in the hospital for couple days.  She is worried about her kidney function.  I really think this is all related to her diabetes.  She needs to have her family doctor make a referral for Nephrology.  She has little swelling in the legs.  I suspect this might be a combination of factors.  This might be from her amlodipine  that she is taking.  In addition, could also be from some slight anemia.  She does have a low erythropoietin  level.  Will recheck it back in March, the erythropoietin  level was 18.3.  Her last iron studies that were done in May showed iron saturation of 36%.  Her appetite is doing okay.  She has had no nausea or vomiting.  She has had no bleeding.  There has been no change in bowel or bladder habits.  She has had no rashes.  There has been no swollen lymph nodes.  Overall, I would have said that her performance status is probably ECOG 1.    Medications:  Allergies as of 07/17/2023       Reactions   Codeine Other (See Comments)   REACTION: makes her nervous, orTylenol #3   Ibuprofen Other (See Comments)   REACTION: nervous   Meperidine  Hcl Nausea And Vomiting   Naproxen Sodium Other (See Comments)   Or  Advil REACTION: nervous   Tramadol  Other (See Comments)   Insomnia    Tylenol  With Codeine #3 [acetaminophen -codeine] Nausea And Vomiting        Medication List        Accurate as of July 17, 2023 10:46 AM. If you have any questions, ask your nurse or doctor.          acetaminophen  500 MG tablet Commonly known as: TYLENOL  Take 1 tablet (500 mg total) by mouth every 6 (six) hours as needed.   amLODipine  10 MG tablet Commonly known as: NORVASC  Take 1 tablet (10 mg total) by mouth daily.   aspirin  81 MG chewable tablet Chew 1 tablet (81 mg total) by mouth daily.   atenolol  100 MG tablet Commonly known as: TENORMIN  Take 1 tablet (100 mg total) by mouth daily.   atorvastatin  20 MG tablet Commonly known as: LIPITOR TAKE 1 TABLET(20 MG) BY MOUTH DAILY   Azelastine  HCl 137 MCG/SPRAY Soln Place into the nose at bedtime as needed.   CALCIUM  500 PO Take by mouth.   clopidogrel  75 MG tablet Commonly known as: PLAVIX  Take 1 tablet (75 mg total) by mouth daily.   diazepam  5 MG tablet Commonly known as: VALIUM  Take 1 tablet (5 mg total) by mouth every 6 (six) hours as needed for anxiety.  fluticasone  50 MCG/ACT nasal spray Commonly known as: FLONASE  SHAKE LIQUID AND USE 2 SPRAYS IN EACH NOSTRIL DAILY   FreeStyle Libre 3 Plus Sensor Misc 1 each by Does not apply route continuous. Change every 15 days.   Gvoke HypoPen  1-Pack 1 MG/0.2ML Soaj Generic drug: Glucagon  Inject 1 mg into the skin as needed (low blood sugar with impaired consciousness).   insulin  NPH Human 100 UNIT/ML injection Commonly known as: NOVOLIN N Inject 20 units into the skin every morning and inject 6 units into the skin at evening meal. What changed: additional instructions   insulin  regular 100 units/mL injection Commonly known as: NOVOLIN R Inject 0.05 mLs (5 Units total) into the skin 2 (two) times daily before a meal.   INSULIN  SYRINGE .5CC/31GX5/16 31G X 5/16 0.5 ML Misc USE TO  INJECT INSULIN  5 TIMES PER DAY   isosorbide  mononitrate 30 MG 24 hr tablet Commonly known as: IMDUR  TAKE 1 TABLET BY MOUTH EVERY DAY   metFORMIN  500 MG tablet Commonly known as: GLUCOPHAGE  Take 1 tablet (500 mg total) by mouth daily with supper.   mometasone 0.1 % cream Commonly known as: ELOCON Apply 1 Application topically daily as needed (itchy ears). Use as directed   multivitamin capsule Take 1 capsule by mouth daily.  Centrum   omeprazole  20 MG capsule Commonly known as: PRILOSEC TAKE 1 CAPSULE(20 MG) BY MOUTH DAILY   ondansetron  4 MG tablet Commonly known as: Zofran  Take 1 tablet (4 mg total) by mouth every 8 (eight) hours as needed for nausea or vomiting.   OneTouch Ultra test strip Generic drug: glucose blood USE AS DIRECTED THREE TIMES DAILY   Ozempic  (0.25 or 0.5 MG/DOSE) 2 MG/3ML Sopn Generic drug: Semaglutide (0.25 or 0.5MG /DOS) Inject 0.5 mg into the skin every Monday.   Venclexta  100 MG tablet Generic drug: venetoclax  Take 1 tablet (100 mg total) by mouth daily. Tablets should be swallowed whole with a meal and a full glass of water.   VITAMIN D  PO Take 1 tablet by mouth daily.        Allergies:  Allergies  Allergen Reactions   Codeine Other (See Comments)    REACTION: makes her nervous, orTylenol #3   Ibuprofen Other (See Comments)    REACTION: nervous   Meperidine  Hcl Nausea And Vomiting   Naproxen Sodium Other (See Comments)    Or Advil  REACTION: nervous   Tramadol  Other (See Comments)    Insomnia    Tylenol  With Codeine #3 [Acetaminophen -Codeine] Nausea And Vomiting    Past Medical History, Surgical history, Social history, and Family History were reviewed and updated.  Review of Systems:  Review of Systems  Constitutional: Negative.   HENT: Negative.    Eyes: Negative.   Respiratory: Negative.    Cardiovascular:  Positive for leg swelling.  Gastrointestinal: Negative.   Genitourinary: Negative.   Musculoskeletal: Negative.    Skin: Negative.   Neurological: Negative.   Endo/Heme/Allergies: Negative.   Psychiatric/Behavioral: Negative.       Physical Exam:  height is 5' 5 (1.651 m) and weight is 175 lb 12.8 oz (79.7 kg). Her oral temperature is 97.9 F (36.6 C). Her blood pressure is 159/64 (abnormal) and her pulse is 66. Her respiration is 20 and oxygen saturation is 99%.   Wt Readings from Last 3 Encounters:  07/17/23 175 lb 12.8 oz (79.7 kg)  06/29/23 174 lb 4 oz (79 kg)  05/18/23 170 lb 12.8 oz (77.5 kg)    Physical Exam Vitals reviewed.  HENT:     Head: Normocephalic and atraumatic.  Eyes:     Pupils: Pupils are equal, round, and reactive to light.  Cardiovascular:     Rate and Rhythm: Normal rate and regular rhythm.     Heart sounds: Normal heart sounds.  Pulmonary:     Effort: Pulmonary effort is normal.     Breath sounds: Normal breath sounds.  Abdominal:     General: Bowel sounds are normal.     Palpations: Abdomen is soft.  Musculoskeletal:        General: No tenderness or deformity. Normal range of motion.     Cervical back: Normal range of motion.  Lymphadenopathy:     Cervical: No cervical adenopathy.  Skin:    General: Skin is warm and dry.     Findings: No erythema or rash.  Neurological:     Mental Status: She is alert and oriented to person, place, and time.  Psychiatric:        Behavior: Behavior normal.        Thought Content: Thought content normal.        Judgment: Judgment normal.      Lab Results  Component Value Date   WBC 6.5 07/17/2023   HGB 10.9 (L) 07/17/2023   HCT 32.6 (L) 07/17/2023   MCV 97.6 07/17/2023   PLT 200 07/17/2023   Lab Results  Component Value Date   FERRITIN 636 (H) 05/15/2023   IRON 106 05/15/2023   TIBC 294 05/15/2023   UIBC 188 05/15/2023   IRONPCTSAT 36 (H) 05/15/2023   Lab Results  Component Value Date   RETICCTPCT 2.3 03/20/2023   RBC 3.34 (L) 07/17/2023   Lab Results  Component Value Date   KPAFRELGTCHN 32.8 (H)  05/16/2021   LAMBDASER 26.4 (H) 05/16/2021   KAPLAMBRATIO 1.24 05/16/2021   Lab Results  Component Value Date   IGGSERUM 845 08/22/2022   IGA 185 08/22/2022   IGMSERUM 37 08/22/2022   Lab Results  Component Value Date   TOTALPROTELP 6.5 11/18/2020   ALBUMINELP 3.6 11/18/2020   A1GS 0.3 11/18/2020   A2GS 0.9 11/18/2020   BETS 1.0 11/18/2020   BETA2SER 6.2 02/25/2014   GAMS 0.7 11/18/2020   MSPIKE Not Observed 11/18/2020   SPEI * 02/25/2014     Chemistry      Component Value Date/Time   NA 142 07/17/2023 0917   NA 147 (H) 11/03/2016 1018   NA 143 08/30/2016 0954   K 4.0 07/17/2023 0917   K 3.9 11/03/2016 1018   K 4.5 08/30/2016 0954   CL 107 07/17/2023 0917   CL 110 (H) 11/03/2016 1018   CO2 26 07/17/2023 0917   CO2 27 11/03/2016 1018   CO2 24 08/30/2016 0954   BUN 17 07/17/2023 0917   BUN 25 (H) 11/03/2016 1018   BUN 20.5 08/30/2016 0954   CREATININE 1.43 (H) 07/17/2023 0917   CREATININE 1.13 (H) 04/13/2023 1523   CREATININE 1.4 (H) 08/30/2016 0954      Component Value Date/Time   CALCIUM  10.3 07/17/2023 0917   CALCIUM  10.1 11/03/2016 1018   CALCIUM  9.8 08/30/2016 0954   ALKPHOS 72 07/17/2023 0917   ALKPHOS 112 (H) 11/03/2016 1018   ALKPHOS 113 08/30/2016 0954   AST 17 07/17/2023 0917   AST 23 08/30/2016 0954   ALT 13 07/17/2023 0917   ALT 29 11/03/2016 1018   ALT 20 08/30/2016 0954   BILITOT 0.4 07/17/2023 0917   BILITOT 0.38 08/30/2016 0954  Impression and Plan: Ms. Roseman is a very pleasant 83 yo caucasian female with CLL.  So far, we have not had any negative prognostic markers.  She does have the 13q-chromosomal abnormality.    Right now, she is off all therapy.  We are just going to watch her.  I think that the leg swelling probably is multifactorial.  I looked at her blood smear.  I do not see anything that looked suspicious.  For right now, I will plan to get her back to see me in about 3 months.   Maude JONELLE Crease,  MD 7/15/202510:46 AM

## 2023-07-17 NOTE — Progress Notes (Signed)
 BP remains elevated, 159/64, followed by Dr. Amon, instructed to monitor daily and if it remains over 135/85, notify Dr. Amon. Verbalized understanding.

## 2023-07-19 ENCOUNTER — Other Ambulatory Visit: Payer: Self-pay

## 2023-07-23 ENCOUNTER — Ambulatory Visit (INDEPENDENT_AMBULATORY_CARE_PROVIDER_SITE_OTHER): Admitting: *Deleted

## 2023-07-23 DIAGNOSIS — M810 Age-related osteoporosis without current pathological fracture: Secondary | ICD-10-CM | POA: Diagnosis not present

## 2023-07-23 MED ORDER — DENOSUMAB 60 MG/ML ~~LOC~~ SOSY: 60.0000 mg | PREFILLED_SYRINGE | SUBCUTANEOUS | Status: AC

## 2023-07-23 NOTE — Progress Notes (Signed)
 Patient here for Prolia  injection per physicians orders  Prolia  60 mg SQ , was administered left arm today. Patient tolerated injection.  Patient next injection due: 6 months, appt made:  No- will schedule in 5 months after benefits are ran again  Initial injection: no  Did Prolia  come from pharmacy (if yes please select patient supplied): no  Cam placed for next injection: yes

## 2023-07-24 ENCOUNTER — Other Ambulatory Visit: Payer: Self-pay | Admitting: Internal Medicine

## 2023-08-09 ENCOUNTER — Ambulatory Visit (INDEPENDENT_AMBULATORY_CARE_PROVIDER_SITE_OTHER): Admitting: Endocrinology

## 2023-08-09 ENCOUNTER — Encounter: Payer: Self-pay | Admitting: Endocrinology

## 2023-08-09 ENCOUNTER — Ambulatory Visit: Payer: Self-pay | Admitting: Endocrinology

## 2023-08-09 VITALS — BP 122/72 | HR 67 | Resp 20 | Ht 65.0 in | Wt 173.4 lb

## 2023-08-09 DIAGNOSIS — Z794 Long term (current) use of insulin: Secondary | ICD-10-CM

## 2023-08-09 DIAGNOSIS — E1122 Type 2 diabetes mellitus with diabetic chronic kidney disease: Secondary | ICD-10-CM

## 2023-08-09 DIAGNOSIS — N1832 Chronic kidney disease, stage 3b: Secondary | ICD-10-CM

## 2023-08-09 DIAGNOSIS — M810 Age-related osteoporosis without current pathological fracture: Secondary | ICD-10-CM | POA: Diagnosis not present

## 2023-08-09 LAB — POCT GLYCOSYLATED HEMOGLOBIN (HGB A1C): Hemoglobin A1C: 6.4 % — AB (ref 4.0–5.6)

## 2023-08-09 MED ORDER — INSULIN NPH (HUMAN) (ISOPHANE) 100 UNIT/ML ~~LOC~~ SUSP: SUBCUTANEOUS | 3 refills | Status: DC

## 2023-08-09 NOTE — Patient Instructions (Signed)
 Diabetes regimen: Novolin N 20 units in the morning and 8 units in the evening with meals. Decrease night dose of Novolin N to 8 units.  Regular insulin  5 units two times a day with meals. 3.   Continue ozempic  and metformin  same.  Take glucose tablet take 2-4 tablets when glucose is low. It is over the counter.

## 2023-08-09 NOTE — Progress Notes (Signed)
 Outpatient Endocrinology Note [place] Abad Manard, MD  08/09/23  Patient's Name: [name]    DOB: 09/30/1940    MRN: [id number]                                                    REASON OF VISIT: Follow up for type 2 diabetes mellitus /osteoporosis  PCP: Amon Aloysius BRAVO, MD  HISTORY OF PRESENT ILLNESS:   [name] is a 83 y.o. old female with past medical history listed below, is here for follow up of type 2 diabetes mellitus / osteoporosis.   Pertinent Diabetes History: Patient was diagnosed with type 2 diabetes mellitus in 1998.   Chronic Diabetes Complications : Retinopathy: yes following with ophthalmology.   Nephropathy: CKD 3B, on irbesartan  Peripheral neuropathy: no Coronary artery disease: yes, CABG Stroke: no  Relevant comorbidities and cardiovascular risk factors: Obesity: no Body mass index is 28.86 kg/m.  Hypertension: yes Hyperlipidemia. Yes, on a statin  Current / Home Diabetic regimen includes: Novolin N 20 units in the morning and 10 units in the evening. Regular insulin  5 units with breakfast and 5 units with supper. Metformin  500 mg daily. Ozempic  0.5 mg weekly.  Prior diabetic medications: Victoza  in the past, changed to Ozempic  due to insurance preference in ~ May 2024. Insulin  doses were decreased in October due to hypoglycemia.  Glycemic data:   CONTINUOUS GLUCOSE MONITORING SYSTEM (CGMS) INTERPRETATION: At today's visit, we reviewed CGM downloads. The full report is scanned in the media. Reviewing the CGM trends, blood glucose are as follows:  FreeStyle Libre 3 CGM-  Sensor Download (Sensor download was reviewed and summarized below.) Dates: July 25 to August 09, 2023, 14 days. Sensor Average: 146 Glucose Management Indicator: 6.8%  % data captured: 97%    Interpretation: Mostly acceptable blood sugar.  No significant hyperglycemia.  Occasional blood sugar around 200s at bedtime.  Rarely blood sugar in 60s overnight/early morning.   No recurrent/concerning hypoglycemia.  Hypoglycemia: Patient has no hypoglycemic episodes. Patient has hypoglycemia ? awareness.  Factors modifying glucose control: 1.  Diabetic diet assessment: 2-3 meals a day, she sometimes does not eat dinner.  At suppertime she usually does like to snack.  2.  Staying active or exercising: No formal exercise.  3.  Medication compliance: compliant all of the time.  # Osteoporosis : -Osteoporosis was being managed by primary care provider and referred to endocrinology after her DEXA scan in July 18, 2022.  -DEXA scan in July 18, 2022 T-score AP spine L1-L4 -0.7  and Right hip total -2.7 consistent with osteoporosis. ( Left hip not used due to surgery)  DEX scan in 12/2015, T-Score: AP spine -0.8, RFN -2.5, LFN -2.9 consistent with osteoporosis. (GE lunar)  Patient had remote history of foot fracture due to walking.  She had a history of left hip fracture in August 2020.  Treatment history: In August 2020 started on Fosamax  and later switched to Prolia  due to CKD and difficulty swallowing, first dose of Prolia  in December 2021, she has been Prolia  every 6 months without missing and last dose was in July 2025.  She is currently taking vitamin D3 1000 international unit daily, multivitamin 1 tablet daily.  She is taking additional calcium .  She eats yogurt daily and fair amount of cheese.  CTX in August 2024 was 175  normal.  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  During the chemotherapy course she had received multiple course of dexamethasone , which is on 6 factor for continued bone loss.  Patient has remote history of colon cancer. DCIS left breast.   Interval history  CGM data as reviewed above.  Mostly acceptable blood sugar.  Diabetes regimen as reviewed and noted above.  She continues to take Novolin N 10 units at bedtime, will plan to decrease to 6 unit in the last visit.  Hemoglobin A1c today 6.4%, remained  acceptable.  She has been taking calcium  and vitamin D  supplement.  No current fracture.  She had Prolia  injection last month.  No other complaints today.  REVIEW OF SYSTEMS As per history of present illness.   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   Gastritis    GERD (gastroesophageal reflux disease)    Headache(784.0)    History of blood transfusion    Ive had 20 some; thru birth of children, loss of blood, last 2 were in ~ 1999 before my cancer surgery (09/16/2015)   History of hiatal hernia    History of shingles    Hx of colonic polyps    Hyperlipidemia    Hypertension    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HCC) 2000   Myocardial infarction (HCC)    1997   Pneumonia    X 1   Postoperative hematoma involving circulatory system following cardiac catheterization 09/17/2015   S/P cardiac cath 09/16/15 09/17/2015   Type II diabetes mellitus (HCC)    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[facility])    as a child   Venous insufficiency     PAST SURGICAL HISTORY: Past Surgical History:  Procedure Laterality Date   BREAST BIOPSY Left 03/10/2022   MM LT BREAST BX W LOC DEV 1ST LESION IMAGE BX SPEC STEREO GUIDE 03/10/2022 [facility]   BREAST BIOPSY  05/01/2022   MM LT RADIOACTIVE SEED LOC MAMMO GUIDE 05/01/2022 [facility]   BREAST LUMPECTOMY WITH RADIOACTIVE SEED LOCALIZATION Left 05/02/2022   Procedure: LEFT BREAST LUMPECTOMY WITH RADIOACTIVE SEED LOCALIZATION;  Surgeon: Vanderbilt Ned, MD;  Location: [facility];  Service: General;  Laterality: Left;   CARDIAC CATHETERIZATION  1997; 2008; 09/16/2015   CARDIAC CATHETERIZATION N/A 09/16/2015   Procedure: Right/Left Heart Cath and Coronary/Graft Angiography;  Surgeon: Dorn JINNY Court, MD;  Location: [facility];  Service: Cardiovascular;  Laterality: N/A;   CESAREAN SECTION  1966   COLONOSCOPY     CORONARY ARTERY BYPASS GRAFT  1997   CABG X5   EYE SURGERY     2 laser surgeries on left eye with implant   EYE SURGERY Bilateral    cataracts   FEMUR IM NAIL Left 08/28/2018   Procedure: INTRAMEDULLARY (IM) NAIL FEMORAL;  Surgeon: Fidel Rogue, MD;  Location: [facility];  Service: Orthopedics;  Laterality: Left;  FRACTURE SURGERY     HERNIA REPAIR     LAPAROSCOPIC ASSISTED VENTRAL HERNIA REPAIR  09/2008    with incarcerated colon;  Dr. 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 Gretel   SHOULDER ARTHROSCOPY W/ ROTATOR CUFF REPAIR Right 1997   SMALL INTESTINE SURGERY     TONSILLECTOMY  1960s   TOTAL KNEE ARTHROPLASTY WITH HARDWARE REMOVAL Left 12/1994   (infex, hardware removed )- not replacement per patient   TUBAL LIGATION  1966    ALLERGIES: Allergies  Allergen Reactions   Codeine Other (See Comments)    REACTION: makes her nervous, orTylenol #3   Ibuprofen Other (See Comments)    REACTION: nervous   Meperidine  Hcl Nausea And Vomiting   Naproxen Sodium Other (See Comments)    Or Advil  REACTION: nervous   Tramadol  Other (See Comments)    Insomnia    Tylenol  With Codeine #3  [Acetaminophen -Codeine] Nausea And Vomiting    FAMILY HISTORY:  Family History  Problem Relation Age of Onset   Heart disease Mother 33   Heart disease Father 47       MI   Hypertension Child    Heart attack Child    Diabetes Child    Heart disease Maternal Aunt        x 2, all deceased   Heart disease Maternal Uncle        x 4, all deceased   Emphysema Brother 47   Colon cancer Neg Hx    Breast cancer Neg Hx    Rectal cancer Neg Hx    Stomach cancer Neg Hx     SOCIAL HISTORY: Social History   Socioeconomic History   Marital status: Widowed    Spouse name: Not on file   Number of children: 2   Years of education: Not on file   Highest education level: Not on file  Occupational History   Occupation: retired-- [employer]     Employer: RETIRED  Tobacco Use   Smoking status: Never   Smokeless tobacco: Never  Vaping Use   Vaping status: Never Used  Substance and Sexual Activity   Alcohol  use: No    Alcohol /week: 0.0 standard drinks of alcohol    Drug use: No   Sexual activity: Not Currently  Other Topics Concern   Not on file  Social History Narrative   Lives by herself, lost husband ~ 2004 , still drives    1 child in [place]   I child in [place]    Social Drivers of Health   Financial Resource Strain: Low Risk  (06/29/2022)   Overall Financial Resource Strain (CARDIA)    Difficulty of Paying Living Expenses: Not hard at all  Food Insecurity: No Food Insecurity (04/10/2023)   Hunger Vital Sign    Worried About Running Out of Food in the Last Year: Never true    Ran Out of Food in the Last Year: Never true  Transportation Needs: No Transportation Needs (04/10/2023)   PRAPARE - Transportation    Lack  of Transportation (Medical): No    Lack of Transportation (Non-Medical): No  Physical Activity: Inactive (06/29/2022)   Exercise Vital Sign    Days of Exercise per Week: 0 days    Minutes of Exercise per Session: 0 min  Stress: No Stress Concern Present (06/29/2022)    [employer] of Occupational Health - Occupational Stress Questionnaire    Feeling of Stress : Only a little  Social Connections: Moderately Integrated (04/08/2023)   Social Connection and Isolation Panel    Frequency of Communication with Friends and Family: More than three times a week    Frequency of Social Gatherings with Friends and Family: More than three times a week    Attends Religious Services: More than 4 times per year    Active Member of [employer] or Organizations: Yes    Attends Banker Meetings: More than 4 times per year    Marital Status: Widowed    MEDICATIONS:  Current Outpatient Medications  Medication Sig Dispense Refill   acetaminophen  (TYLENOL ) 500 MG tablet Take 1 tablet (500 mg total) by mouth every 6 (six) hours as needed. 30 tablet 0   amLODipine  (NORVASC ) 10 MG tablet Take 1 tablet (10 mg total) by mouth daily. 30 tablet 6   aspirin  81 MG chewable tablet Chew 1 tablet (81 mg total) by mouth daily.     atenolol  (TENORMIN ) 100 MG tablet Take 1 tablet (100 mg total) by mouth daily. 90 tablet 1   atorvastatin  (LIPITOR) 20 MG tablet TAKE 1 TABLET(20 MG) BY MOUTH DAILY 90 tablet 3   Azelastine  HCl 137 MCG/SPRAY SOLN Place into the nose at bedtime as needed.     Calcium  Carbonate (CALCIUM  500 PO) Take by mouth.     clopidogrel  (PLAVIX ) 75 MG tablet Take 1 tablet (75 mg total) by mouth daily. 90 tablet 1   Continuous Glucose Sensor (FREESTYLE LIBRE 3 PLUS SENSOR) MISC 1 each by Does not apply route continuous. Change every 15 days. 6 each 4   diazepam  (VALIUM ) 5 MG tablet Take 1 tablet (5 mg total) by mouth every 6 (six) hours as needed for anxiety. 20 tablet 0   fluticasone  (FLONASE ) 50 MCG/ACT nasal spray SHAKE LIQUID AND USE 2 SPRAYS IN EACH NOSTRIL DAILY 48 g 3   Glucagon  (GVOKE HYPOPEN  1-PACK) 1 MG/0.2ML SOAJ Inject 1 mg into the skin as needed (low blood sugar with impaired consciousness). 0.4 mL 2   glucose blood (ONETOUCH ULTRA) test strip USE AS  DIRECTED THREE TIMES DAILY 100 strip 5   insulin  regular (NOVOLIN R) 100 units/mL injection Inject 0.05 mLs (5 Units total) into the skin 2 (two) times daily before a meal. 10 mL 3   Insulin  Syringe-Needle U-100 (INSULIN  SYRINGE .5CC/31GX5/16) 31G X 5/16 0.5 ML MISC USE TO INJECT INSULIN  5 TIMES PER DAY 200 each 0   isosorbide  mononitrate (IMDUR ) 30 MG 24 hr tablet TAKE 1 TABLET BY MOUTH EVERY DAY 90 tablet 3   metFORMIN  (GLUCOPHAGE ) 500 MG tablet Take 1 tablet (500 mg total) by mouth daily with supper. 90 tablet 1   mometasone (ELOCON) 0.1 % cream Apply 1 Application topically daily as needed (itchy ears). Use as directed     Multiple Vitamin (MULTIVITAMIN) capsule Take 1 capsule by mouth daily.  Centrum     omeprazole  (PRILOSEC) 20 MG capsule TAKE 1 CAPSULE(20 MG) BY MOUTH DAILY 90 capsule 3   ondansetron  (ZOFRAN ) 4 MG tablet Take 1 tablet (4 mg total) by mouth every 8 (eight)  hours as needed for nausea or vomiting. 20 tablet 0   Semaglutide ,0.25 or 0.5MG /DOS, (OZEMPIC , 0.25 OR 0.5 MG/DOSE,) 2 MG/3ML SOPN Inject 0.5 mg into the skin every Monday. 9 mL 1   venetoclax  (VENCLEXTA ) 100 MG tablet Take 1 tablet (100 mg total) by mouth daily. Tablets should be swallowed whole with a meal and a full glass of water. 28 tablet 3   VITAMIN D  PO Take 1 tablet by mouth daily.     insulin  NPH Human (NOVOLIN N) 100 UNIT/ML injection Inject 20 units into the skin every morning and inject 8 units into the skin at evening meal. 10 mL 3   Current Facility-Administered Medications  Medication Dose Route Frequency Provider Last Rate Last Admin   [START ON 01/18/2024] denosumab  (PROLIA ) injection 60 mg  60 mg Subcutaneous Q6 months Paz, Jose E, MD       Facility-Administered Medications Ordered in Other Visits  Medication Dose Route Frequency Provider Last Rate Last Admin   vancomycin  (VANCOCIN ) 1,000 mg in sodium chloride  0.9 % 500 mL IVPB  1,000 mg Intravenous Once Cornett, Debby, MD        PHYSICAL  EXAM: Vitals:   08/09/23 1356  BP: 122/72  Pulse: 67  Resp: 20  SpO2: 98%  Weight: 173 lb 6.4 oz (78.7 kg)  Height: 5' 5 (1.651 m)     Body mass index is 28.86 kg/m.  Wt Readings from Last 3 Encounters:  08/09/23 173 lb 6.4 oz (78.7 kg)  07/17/23 175 lb 12.8 oz (79.7 kg)  06/29/23 174 lb 4 oz (79 kg)    General: Well developed, well nourished female in no apparent distress.  HEENT: AT/[place], no external lesions.  Eyes: Conjunctiva clear and no icterus. Neck: Neck supple  Lungs: Respirations not labored Neurologic: Alert, oriented, normal speech Extremities / Skin: Dry.    Psychiatric: Does not appear depressed or anxious  Diabetic Foot Exam - Simple   No data filed    LABS Reviewed Lab Results  Component Value Date   HGBA1C 6.4 (A) 08/09/2023   HGBA1C 6.2 (A) 04/30/2023   HGBA1C 5.4 11/29/2022   Lab Results  Component Value Date   FRUCTOSAMINE 205 05/11/2022   FRUCTOSAMINE 207 10/25/2021   FRUCTOSAMINE 230 06/28/2021   Lab Results  Component Value Date   CHOL 124 05/18/2023   HDL 41.50 05/18/2023   LDLCALC 38 05/18/2023   LDLDIRECT 38.0 07/17/2022   TRIG 220.0 (H) 05/18/2023   CHOLHDL 3 05/18/2023   Lab Results  Component Value Date   MICRALBCREAT 281 (H) 04/30/2023   Lab Results  Component Value Date   CREATININE 1.43 (H) 07/17/2023   Lab Results  Component Value Date   GFR 40.00 (L) 08/31/2022    ASSESSMENT / PLAN  1. Type 2 diabetes mellitus with stage 3b chronic kidney disease, with long-term current use of insulin  (HCC)   2. Age-related osteoporosis without current pathological fracture     Diabetes Mellitus type 2, complicated by CKD/ CAD - Diabetic status / severity: Fair control.  Lab Results  Component Value Date   HGBA1C 6.4 (A) 08/09/2023    - Hemoglobin A1c goal : <7.5%  Due to low normal blood sugar overnight I would like to decrease the Novolin N for the evening dose, adjusted diabetes regimen as follows.  Diabetes  regimen: Diabetes regimen: Continue Novolin N 20 units in the morning and decrease from 10 units to 8 units in the evening with meals. Continue regular insulin  5 units  two times a day with meals. 3.   Continue ozempic  0.5 mg weekly and metformin  500 mg daily.  Take glucose tablet take 2-4 tablets when glucose is low. It is over the counter.  - Home glucose testing: Continue CGM/freestyle libre 3+, sent new prescription.  Check blood sugar as needed.  - Discussed/ Gave Hypoglycemia treatment plan.  In detail about risks of hypoglycemia.  Patient has been getting glucose tablet.  Patient lives by herself not able to use Glucagon  Emergency Kit, sent prescription for glucagon  kit as well.  Discussed that hypoglycemia can be immediately and acutely dangerous and even life-threatening.  # Consult : not required at this time.   # Annual urine for microalbuminuria/ creatinine ratio,+ microalbuminuria currently, continue ACE/ARB /irbesartan .  Consider SGLT 2 inhibitor if remains high. Last  Lab Results  Component Value Date   MICRALBCREAT 281 (H) 04/30/2023    # Foot check nightly / neuropathy.  # Annual dilated diabetic eye exams.   2. Blood pressure  -  BP Readings from Last 1 Encounters:  08/09/23 122/72    - Control is in target.  - No change in current plans.  3. Lipid status / Hyperlipidemia - Last  Lab Results  Component Value Date   LDLCALC 38 05/18/2023   - Continue atorvastatin  20 mg daily.  Managed by primary care provider.  # Osteoporosis -Patient had recent DEXA scan in July 2024 consistent with osteoporosis based on lowest T-score of -2.7 at right hip and T-score at lumbar spine -0.7.  She had DEXA scan in 2017 with lowest T-score of -2.9 at femoral neck and T-score of -0.8 at lumbar spine consistent with osteoporosis. -She was treated with Fosamax  initially started in August 2020 and switched to Prolia .  First dose was in December 2021, she has received Prolia  every 6  months. -She has no recent fracture. -Not able to compare bone density done in 2017 and bone density in July 2024 as they were done in different places.  However overall it seems to be stable, over the period of 7 years, despite she had received multiple doses of dexamethasone  for the treatment of CLL in last 2 years which is a risk for bone loss.  Plan: -Continue Prolia  60 mg subcutaneous every 6 months.  Last injection in July , 2025. -Continue current dose of vitamin D  and calcium . Take calcium  500 mg daily in addition to dietary calcium  and multivitamin. -Discussed about fall precautions.  Diagnoses and all orders for this visit:  Type 2 diabetes mellitus with stage 3b chronic kidney disease, with long-term current use of insulin  (HCC) -     POCT glycosylated hemoglobin (Hb A1C) -     insulin  NPH Human (NOVOLIN N) 100 UNIT/ML injection; Inject 20 units into the skin every morning and inject 8 units into the skin at evening meal.  Age-related osteoporosis without current pathological fracture   DISPOSITION Follow up in clinic in 3 months suggested.   All questions answered and patient verbalized understanding of the plan.  [place] Johnathan Heskett, MD [facility] Endocrinology [facility] [address] [place], [address] [place], [place] [address] Phone # [phone]  At least part of this note was generated using voice recognition software. Inadvertent word errors may have occurred, which were not recognized during the proofreading process.

## 2023-08-13 ENCOUNTER — Ambulatory Visit: Admitting: Endocrinology

## 2023-08-14 ENCOUNTER — Other Ambulatory Visit: Payer: Self-pay | Admitting: Internal Medicine

## 2023-08-21 DIAGNOSIS — M1611 Unilateral primary osteoarthritis, right hip: Secondary | ICD-10-CM | POA: Diagnosis not present

## 2023-08-21 DIAGNOSIS — M545 Low back pain, unspecified: Secondary | ICD-10-CM | POA: Diagnosis not present

## 2023-09-04 ENCOUNTER — Ambulatory Visit (INDEPENDENT_AMBULATORY_CARE_PROVIDER_SITE_OTHER)

## 2023-09-04 VITALS — Ht 65.0 in | Wt 173.0 lb

## 2023-09-04 DIAGNOSIS — Z0000 Encounter for general adult medical examination without abnormal findings: Secondary | ICD-10-CM

## 2023-09-04 NOTE — Progress Notes (Signed)
 Subjective:   [name] is a 83 y.o. who presents for a Medicare Wellness preventive visit.  As a reminder, Annual Wellness Visits don't include a physical exam, and some assessments may be limited, especially if this visit is performed virtually. We may recommend an in-person follow-up visit with your provider if needed.  Visit Complete: Virtual I connected with  [name] on 09/04/23 by a audio enabled telemedicine application and verified that I am speaking with the correct person using two identifiers.  Patient Location: Home  Provider Location: Home Office  I discussed the limitations of evaluation and management by telemedicine. The patient expressed understanding and agreed to proceed.  Vital Signs: Because this visit was a virtual/telehealth visit, some criteria may be missing or patient reported. Any vitals not documented were not able to be obtained and vitals that have been documented are patient reported.    Persons Participating in Visit: Patient.  AWV Questionnaire: No: Patient Medicare AWV questionnaire was not completed prior to this visit.  Cardiac Risk Factors include: advanced age (>12men, >53 women);hypertension     Objective:    Today's Vitals   09/04/23 1545  Weight: 173 lb (78.5 kg)  Height: 5' 5 (1.651 m)   Body mass index is 28.79 kg/m.     09/04/2023    3:54 PM 07/17/2023   10:08 AM 05/15/2023   11:48 AM 04/08/2023   10:18 AM 04/07/2023    1:21 PM 03/20/2023   11:19 AM 01/08/2023   12:07 PM  Advanced Directives  Does Patient Have a Medical Advance Directive? Yes Yes Yes  No Yes Yes  Type of Estate agent of [place];Living will Healthcare Power of [place];Living will Healthcare Power of [place];Living will  Healthcare Power of [place];Living will Healthcare Power of [place];Living will Living will  Does patient want to make changes to medical advance directive?      No - Patient declined   Copy of Healthcare  Power of Attorney in Chart? No - copy requested  No - copy requested      Would patient like information on creating a medical advance directive?   No - Patient declined No - Patient declined   No - Patient declined    Current Medications (verified) Outpatient Encounter Medications as of 09/04/2023  Medication Sig   acetaminophen  (TYLENOL ) 500 MG tablet Take 1 tablet (500 mg total) by mouth every 6 (six) hours as needed.   amLODipine  (NORVASC ) 10 MG tablet Take 1 tablet (10 mg total) by mouth daily.   aspirin  81 MG chewable tablet Chew 1 tablet (81 mg total) by mouth daily.   atenolol  (TENORMIN ) 100 MG tablet Take 1 tablet (100 mg total) by mouth daily.   atorvastatin  (LIPITOR) 20 MG tablet TAKE 1 TABLET(20 MG) BY MOUTH DAILY   Azelastine  HCl 137 MCG/SPRAY SOLN Place into the nose at bedtime as needed.   Calcium  Carbonate (CALCIUM  500 PO) Take by mouth.   clopidogrel  (PLAVIX ) 75 MG tablet Take 1 tablet (75 mg total) by mouth daily.   Continuous Glucose Sensor (FREESTYLE LIBRE 3 PLUS SENSOR) MISC 1 each by Does not apply route continuous. Change every 15 days.   diazepam  (VALIUM ) 5 MG tablet Take 1 tablet (5 mg total) by mouth every 6 (six) hours as needed for anxiety.   fluticasone  (FLONASE ) 50 MCG/ACT nasal spray SHAKE LIQUID AND USE 2 SPRAYS IN EACH NOSTRIL DAILY   Glucagon  (GVOKE HYPOPEN  1-PACK) 1 MG/0.2ML SOAJ Inject 1 mg into the  skin as needed (low blood sugar with impaired consciousness).   glucose blood (ONETOUCH ULTRA) test strip USE AS DIRECTED THREE TIMES DAILY   insulin  NPH Human (NOVOLIN N) 100 UNIT/ML injection Inject 20 units into the skin every morning and inject 8 units into the skin at evening meal.   insulin  regular (NOVOLIN R) 100 units/mL injection Inject 0.05 mLs (5 Units total) into the skin 2 (two) times daily before a meal.   Insulin  Syringe-Needle U-100 (INSULIN  SYRINGE .5CC/31GX5/16) 31G X 5/16 0.5 ML MISC USE TO INJECT INSULIN  5 TIMES PER DAY   isosorbide   mononitrate (IMDUR ) 30 MG 24 hr tablet TAKE 1 TABLET BY MOUTH EVERY DAY   metFORMIN  (GLUCOPHAGE ) 500 MG tablet Take 1 tablet (500 mg total) by mouth daily with supper.   mometasone (ELOCON) 0.1 % cream Apply 1 Application topically daily as needed (itchy ears). Use as directed   Multiple Vitamin (MULTIVITAMIN) capsule Take 1 capsule by mouth daily.  Centrum   omeprazole  (PRILOSEC) 20 MG capsule TAKE 1 CAPSULE(20 MG) BY MOUTH DAILY   ondansetron  (ZOFRAN ) 4 MG tablet Take 1 tablet (4 mg total) by mouth every 8 (eight) hours as needed for nausea or vomiting.   Semaglutide ,0.25 or 0.5MG /DOS, (OZEMPIC , 0.25 OR 0.5 MG/DOSE,) 2 MG/3ML SOPN Inject 0.5 mg into the skin every Monday.   venetoclax  (VENCLEXTA ) 100 MG tablet Take 1 tablet (100 mg total) by mouth daily. Tablets should be swallowed whole with a meal and a full glass of water.   VITAMIN D  PO Take 1 tablet by mouth daily.   Facility-Administered Encounter Medications as of 09/04/2023  Medication   [START ON 01/18/2024] denosumab  (PROLIA ) injection 60 mg   vancomycin  (VANCOCIN ) 1,000 mg in sodium chloride  0.9 % 500 mL IVPB    Allergies (verified) Codeine, Ibuprofen, Meperidine  hcl, Naproxen sodium, Tramadol , and Tylenol  with codeine #3 [acetaminophen -codeine]   History: Past Medical History:  Diagnosis Date   Acute blood loss anemia 09/17/2015   Allergy    Anemia    Anginal pain (HCC) 2017   Anxiety    Arthritis    hands and knees mostly (09/16/2015)   B12 deficiency anemia    CAD (coronary artery disease)    Dr Court   Cataract    bil cataracts removed   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   Clotting disorder (HCC)    Depression    DJD (degenerative joint disease)    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   Gastritis    GERD (gastroesophageal reflux disease)    Headache(784.0)    History of blood transfusion    Ive had 20 some; thru birth of children, loss of blood, last 2 were in ~ 1999 before my  cancer surgery (09/16/2015)   History of hiatal hernia    History of shingles    Hx of colonic polyps    Hyperlipidemia    Hypertension    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HCC) 2000   Myocardial infarction (HCC)    1997   Pneumonia    X 1   Postoperative hematoma involving circulatory system following cardiac catheterization 09/17/2015   S/P cardiac cath 09/16/15 09/17/2015   Type II diabetes mellitus (HCC)    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[facility])    as a child   Venous insufficiency    Past Surgical History:  Procedure Laterality Date   BREAST BIOPSY Left 03/10/2022   MM LT BREAST BX W LOC DEV 1ST  LESION IMAGE BX SPEC STEREO GUIDE 03/10/2022 [facility]   BREAST BIOPSY  05/01/2022   MM LT RADIOACTIVE SEED LOC MAMMO GUIDE 05/01/2022 [facility]   BREAST LUMPECTOMY WITH RADIOACTIVE SEED LOCALIZATION Left 05/02/2022   Procedure: LEFT BREAST LUMPECTOMY WITH RADIOACTIVE SEED LOCALIZATION;  Surgeon: Vanderbilt Ned, MD;  Location: [facility];  Service: General;  Laterality: Left;   CARDIAC CATHETERIZATION  1997; 2008; 09/16/2015   CARDIAC CATHETERIZATION N/A 09/16/2015   Procedure: Right/Left Heart Cath and Coronary/Graft Angiography;  Surgeon: Dorn JINNY Lesches, MD;  Location: [facility];  Service: Cardiovascular;  Laterality: N/A;   CESAREAN SECTION  1966   COLONOSCOPY     CORONARY ARTERY BYPASS GRAFT  1997   CABG X5   EYE SURGERY     2 laser surgeries on left eye with implant   EYE SURGERY Bilateral    cataracts   FEMUR IM NAIL Left 08/28/2018   Procedure: INTRAMEDULLARY (IM) NAIL FEMORAL;  Surgeon: Fidel Rogue, MD;  Location: [facility];  Service: Orthopedics;  Laterality: Left;   FRACTURE SURGERY     HERNIA REPAIR     LAPAROSCOPIC ASSISTED VENTRAL HERNIA REPAIR  09/2008    with incarcerated colon;  Dr. Ethyl   MOHS SURGERY  06/2016   [facility]   PATELLA FRACTURE SURGERY Left 11/1994   crushed my knee; put in a plate & 6  screws   POLYPECTOMY     RE-EXCISION OF BREAST LUMPECTOMY Left 05/18/2022   Procedure: RE-EXCISION OF LEFT BREAST LUMPECTOMY;  Surgeon: Vanderbilt Ned, MD;  Location: [facility];  Service: General;  Laterality: Left;  60 MIN ROOM 8   REFRACTIVE SURGERY Left 07/30/2015   resection of small bowel carcinoma  06/1998   Dr. Gretel   SHOULDER ARTHROSCOPY W/ ROTATOR CUFF REPAIR Right 1997   SMALL INTESTINE SURGERY     TONSILLECTOMY  1960s   TOTAL KNEE ARTHROPLASTY WITH HARDWARE REMOVAL Left 12/1994   (infex, hardware removed )- not replacement per patient   TUBAL LIGATION  1966   Family History  Problem Relation Age of Onset   Heart disease Mother 69   Heart disease Father 68       MI   Hypertension Child    Heart attack Child    Diabetes Child    Heart disease Maternal Aunt        x 2, all deceased   Heart disease Maternal Uncle        x 4, all deceased   Emphysema Brother 24   Colon cancer Neg Hx    Breast cancer Neg Hx    Rectal cancer Neg Hx    Stomach cancer Neg Hx    Social History   Socioeconomic History   Marital status: Widowed    Spouse name: Not on file   Number of children: 2   Years of education: Not on file   Highest education level: Not on file  Occupational History   Occupation: retired-- [employer]     Employer: RETIRED  Tobacco Use   Smoking status: Never   Smokeless tobacco: Never  Vaping Use   Vaping status: Never Used  Substance and Sexual Activity   Alcohol  use: No    Alcohol /week: 0.0 standard drinks of alcohol    Drug use: No   Sexual activity: Not Currently  Other Topics Concern   Not on file  Social History Narrative   Lives by herself, lost husband ~ 2004 , still drives    1 child  in [place]   I child in [place]    Social Drivers of Health   Financial Resource Strain: Low Risk  (09/04/2023)   Overall Financial Resource Strain (CARDIA)    Difficulty of Paying Living Expenses: Not hard at all  Food Insecurity: No Food Insecurity  (09/04/2023)   Hunger Vital Sign    Worried About Running Out of Food in the Last Year: Never true    Ran Out of Food in the Last Year: Never true  Transportation Needs: No Transportation Needs (09/04/2023)   PRAPARE - Administrator, Civil Service (Medical): No    Lack of Transportation (Non-Medical): No  Physical Activity: Inactive (09/04/2023)   Exercise Vital Sign    Days of Exercise per Week: 0 days    Minutes of Exercise per Session: 0 min  Stress: No Stress Concern Present (09/04/2023)   [employer] of Occupational Health - Occupational Stress Questionnaire    Feeling of Stress: Not at all  Social Connections: Moderately Integrated (09/04/2023)   Social Connection and Isolation Panel    Frequency of Communication with Friends and Family: More than three times a week    Frequency of Social Gatherings with Friends and Family: More than three times a week    Attends Religious Services: More than 4 times per year    Active Member of [employer] or Organizations: Yes    Attends Banker Meetings: More than 4 times per year    Marital Status: Widowed    Tobacco Counseling Counseling given: Not Answered    Clinical Intake:  Pre-visit preparation completed: Yes  Pain : No/denies pain     BMI - recorded: 28.79 Nutritional Status: BMI 25 -29 Overweight Nutritional Risks: None Diabetes: Yes CBG done?: Yes (CBG 199 Per patient) CBG resulted in Enter/ Edit results?: Yes Did pt. bring in CBG monitor from home?: No  Lab Results  Component Value Date   HGBA1C 6.4 (A) 08/09/2023   HGBA1C 6.2 (A) 04/30/2023   HGBA1C 5.4 11/29/2022     How often do you need to have someone help you when you read instructions, pamphlets, or other written materials from your doctor or pharmacy?: 1 - Never  Interpreter Needed?: No  Information entered by :: Rojelio Blush LPN   Activities of Daily Living     09/04/2023    3:52 PM 04/08/2023   10:16 AM  In your present state  of health, do you have any difficulty performing the following activities:  Hearing? 0 0  Vision? 0 0  Difficulty concentrating or making decisions? 0 0  Walking or climbing stairs? 1   Comment Uses a Cane   Dressing or bathing? 0   Doing errands, shopping? 0 0  Preparing Food and eating ? N   Using the Toilet? N   In the past six months, have you accidently leaked urine? Y   Comment Wears Breifs. Followed by PCP   Do you have problems with loss of bowel control? N   Managing your Medications? N   Managing your Finances? N   Housekeeping or managing your Housekeeping? N     Patient Care Team: Amon Aloysius BRAVO, MD as PCP - General (Internal Medicine) Court Dorn PARAS, MD as PCP - Cardiology (Cardiology) Timmy Maude SAUNDERS, MD as Consulting Physician (Oncology) Ivin Kocher, MD as Consulting Physician (Dermatology) Jarold Mayo, MD as Consulting Physician (Ophthalmology)  I have updated your Care Teams any recent Medical Services you may have received from other providers  in the past year.     Assessment:   This is a routine wellness examination for [name].  Hearing/Vision screen Hearing Screening - Comments:: Denies hearing difficulties   Vision Screening - Comments:: Wears rx glasses - up to date with routine eye exams with  Dr Jarold   Goals Addressed               This Visit's Progress     Increase physical activity (pt-stated)        Remain active.       Depression Screen     09/04/2023    3:51 PM 07/17/2023   10:14 AM 04/17/2023   10:02 AM 11/17/2022   10:49 AM 07/17/2022    3:33 PM 06/29/2022   11:16 AM 10/26/2021   10:59 AM  PHQ 2/9 Scores  PHQ - 2 Score 0 0 3 1 0 0 2  PHQ- 9 Score   11 5 0  6    Fall Risk     09/04/2023    3:53 PM 06/29/2023    9:42 AM 04/17/2023    9:22 AM 11/17/2022   10:50 AM 07/17/2022    3:46 PM  Fall Risk   Falls in the past year? 0 0 0 0 0  Number falls in past yr: 0 0 0 0 0  Injury with Fall? 0 0 0 0 0  Risk for fall  due to : No Fall Risks      Follow up Falls evaluation completed Falls evaluation completed;Education provided Falls evaluation completed;Education provided Falls evaluation completed Falls evaluation completed    MEDICARE RISK AT HOME:  Medicare Risk at Home Any stairs in or around the home?: Yes If so, are there any without handrails?: No Home free of loose throw rugs in walkways, pet beds, electrical cords, etc?: Yes Adequate lighting in your home to reduce risk of falls?: Yes Life alert?: No Use of a cane, walker or w/c?: Yes Grab bars in the bathroom?: Yes Shower chair or bench in shower?: No Elevated toilet seat or a handicapped toilet?: No  TIMED UP AND GO:  Was the test performed?  No  Cognitive Function: 6CIT completed    01/07/2016    2:24 PM  MMSE - Mini Mental State Exam  Orientation to time 5   Orientation to Place 5   Registration 3   Attention/ Calculation 5   Recall 3   Language- name 2 objects 2   Language- repeat 1  Language- follow 3 step command 3   Language- read & follow direction 1   Write a sentence 1   Copy design 0   Total score 29      Data saved with a previous flowsheet row definition        09/04/2023    3:54 PM 06/29/2022   11:24 AM 06/23/2021    2:32 PM  6CIT Screen  What Year? 0 points 0 points 0 points  What month? 0 points 0 points 0 points  What time? 0 points 0 points 0 points  Count back from 20 0 points 0 points 0 points  Months in reverse 0 points 0 points 0 points  Repeat phrase 0 points 0 points 0 points  Total Score 0 points 0 points 0 points    Immunizations Immunization History  Administered Date(s) Administered   Fluad Quad(high Dose 65+) 10/01/2018, 01/13/2022   Fluad Trivalent(High Dose 65+) 11/17/2022   H1N1 12/10/2007   INFLUENZA, HIGH DOSE SEASONAL PF 11/30/2015,  10/04/2017   Influenza Split 10/03/2010, 11/09/2011   Influenza Whole 10/26/2004, 11/05/2008, 10/15/2009   Influenza,inj,Quad PF,6+ Mos 11/12/2012,  11/03/2013, 10/12/2016, 10/21/2020   Influenza-Unspecified 11/16/2014, 11/03/2019   PFIZER(Purple Top)SARS-COV-2 Vaccination 02/06/2019, 03/03/2019, 11/18/2019, 06/09/2020   Pfizer Covid-19 Vaccine Bivalent Booster 69yrs & up 12/21/2020   Pneumococcal Conjugate-13 03/19/2013   Pneumococcal Polysaccharide-23 10/13/2008, 04/04/2017   Tdap 09/01/2010, 03/10/2021   Zoster Recombinant(Shingrix ) 08/01/2021, 10/07/2021   Zoster, Live 03/19/2013    Screening Tests Health Maintenance  Topic Date Due   INFLUENZA VACCINE  08/03/2023   COVID-19 Vaccine (6 - 2025-26 season) 09/03/2023   HEMOGLOBIN A1C  02/09/2024   Diabetic kidney evaluation - Urine ACR  04/29/2024   FOOT EXAM  04/29/2024   OPHTHALMOLOGY EXAM  05/15/2024   Diabetic kidney evaluation - eGFR measurement  07/16/2024   Medicare Annual Wellness (AWV)  09/03/2024   DTaP/Tdap/Td (3 - Td or Tdap) 03/11/2031   Pneumococcal Vaccine: 50+ Years  Completed   DEXA SCAN  Completed   Zoster Vaccines- Shingrix   Completed   HPV VACCINES  Aged Out   Meningococcal B Vaccine  Aged Out   Colonoscopy  Discontinued    Health Maintenance  Health Maintenance Due  Topic Date Due   INFLUENZA VACCINE  08/03/2023   COVID-19 Vaccine (6 - 2025-26 season) 09/03/2023   Health Maintenance Items Addressed:   Additional Screening:  Vision Screening: Recommended annual ophthalmology exams for early detection of glaucoma and other disorders of the eye. Would you like a referral to an eye doctor? No    Dental Screening: Recommended annual dental exams for proper oral hygiene  Community Resource Referral / Chronic Care Management: CRR required this visit?  No   CCM required this visit?  No   Plan:    I have personally reviewed and noted the following in the patient's chart:   Medical and social history Use of alcohol , tobacco or illicit drugs  Current medications and supplements including opioid prescriptions. Patient is not currently taking  opioid prescriptions. Functional ability and status Nutritional status Physical activity Advanced directives List of other physicians Hospitalizations, surgeries, and ER visits in previous 12 months Vitals Screenings to include cognitive, depression, and falls Referrals and appointments  In addition, I have reviewed and discussed with patient certain preventive protocols, quality metrics, and best practice recommendations. A written personalized care plan for preventive services as well as general preventive health recommendations were provided to patient.   Rojelio LELON Blush, LPN   0/07/7972   After Visit Summary: (MyChart) Due to this being a telephonic visit, the after visit summary with patients personalized plan was offered to patient via MyChart   Notes: Nothing significant to report at this time.

## 2023-09-04 NOTE — Patient Instructions (Addendum)
 Ms. Sheppard , Thank you for taking time out of your busy schedule to complete your Annual Wellness Visit with me. I enjoyed our conversation and look forward to speaking with you again next year. I, as well as your care team,  appreciate your ongoing commitment to your health goals. Please review the following plan we discussed and let me know if I can assist you in the future. Your Game plan/ To Do List    Referrals: If you haven't heard from the office you've been referred to, please reach out to them at the phone provided.   Follow up Visits: We will see or speak with you next year for your Next Medicare AWV with our clinical staff 09/16/24 @ 3:50p  Have you seen your provider in the last 6 months (3 months if uncontrolled diabetes)? Next appointment with provider 10/02/23 @ 10:20a  Clinician Recommendations:  Aim for 30 minutes of exercise or brisk walking, 6-8 glasses of water, and 5 servings of fruits and vegetables each day.       This is a list of the screenings recommended for you:  Health Maintenance  Topic Date Due   Flu Shot  08/03/2023   COVID-19 Vaccine (6 - 2025-26 season) 09/03/2023   Hemoglobin A1C  02/09/2024   Yearly kidney health urinalysis for diabetes  04/29/2024   Complete foot exam   04/29/2024   Eye exam for diabetics  05/15/2024   Yearly kidney function blood test for diabetes  07/16/2024   Medicare Annual Wellness Visit  09/03/2024   DTaP/Tdap/Td vaccine (3 - Td or Tdap) 03/11/2031   Pneumococcal Vaccine for age over 27  Completed   DEXA scan (bone density measurement)  Completed   Zoster (Shingles) Vaccine  Completed   HPV Vaccine  Aged Out   Meningitis B Vaccine  Aged Out   Colon Cancer Screening  Discontinued    Advanced directives: (Copy Requested) Please bring a copy of your health care power of attorney and living will to the office to be added to your chart at your convenience. You can mail to [facility] [address]  [place], [place] [address] or email to [email or web address] Advance Care Planning is important because it:  [x]  Makes sure you receive the medical care that is consistent with your values, goals, and preferences  [x]  It provides guidance to your family and loved ones and reduces their decisional burden about whether or not they are making the right decisions based on your wishes.  Follow the link provided in your after visit summary or read over the paperwork we have mailed to you to help you started getting your Advance Directives in place. If you need assistance in completing these, please reach out to us  so that we can help you!  See attachments for Preventive Care and Fall Prevention Tips.

## 2023-10-01 DIAGNOSIS — F321 Major depressive disorder, single episode, moderate: Secondary | ICD-10-CM | POA: Insufficient documentation

## 2023-10-02 ENCOUNTER — Encounter: Payer: Self-pay | Admitting: Internal Medicine

## 2023-10-02 ENCOUNTER — Ambulatory Visit: Admitting: Internal Medicine

## 2023-10-02 VITALS — BP 136/62 | HR 53 | Temp 98.1°F | Resp 18 | Ht 65.0 in | Wt 174.4 lb

## 2023-10-02 DIAGNOSIS — E1122 Type 2 diabetes mellitus with diabetic chronic kidney disease: Secondary | ICD-10-CM | POA: Diagnosis not present

## 2023-10-02 DIAGNOSIS — R0609 Other forms of dyspnea: Secondary | ICD-10-CM | POA: Diagnosis not present

## 2023-10-02 DIAGNOSIS — C9110 Chronic lymphocytic leukemia of B-cell type not having achieved remission: Secondary | ICD-10-CM

## 2023-10-02 DIAGNOSIS — I10 Essential (primary) hypertension: Secondary | ICD-10-CM | POA: Diagnosis not present

## 2023-10-02 DIAGNOSIS — Z794 Long term (current) use of insulin: Secondary | ICD-10-CM | POA: Diagnosis not present

## 2023-10-02 DIAGNOSIS — I2510 Atherosclerotic heart disease of native coronary artery without angina pectoris: Secondary | ICD-10-CM

## 2023-10-02 DIAGNOSIS — D0512 Intraductal carcinoma in situ of left breast: Secondary | ICD-10-CM

## 2023-10-02 DIAGNOSIS — Z23 Encounter for immunization: Secondary | ICD-10-CM | POA: Diagnosis not present

## 2023-10-02 DIAGNOSIS — R0602 Shortness of breath: Secondary | ICD-10-CM

## 2023-10-02 LAB — CBC WITH DIFFERENTIAL/PLATELET
Basophils Absolute: 0 K/uL (ref 0.0–0.1)
Basophils Relative: 0.3 % (ref 0.0–3.0)
Eosinophils Absolute: 0.2 K/uL (ref 0.0–0.7)
Eosinophils Relative: 2.2 % (ref 0.0–5.0)
HCT: 35.8 % — ABNORMAL LOW (ref 36.0–46.0)
Hemoglobin: 11.9 g/dL — ABNORMAL LOW (ref 12.0–15.0)
Lymphocytes Relative: 27.6 % (ref 12.0–46.0)
Lymphs Abs: 2 K/uL (ref 0.7–4.0)
MCHC: 33.2 g/dL (ref 30.0–36.0)
MCV: 97.4 fl (ref 78.0–100.0)
Monocytes Absolute: 0.7 K/uL (ref 0.1–1.0)
Monocytes Relative: 9 % (ref 3.0–12.0)
Neutro Abs: 4.5 K/uL (ref 1.4–7.7)
Neutrophils Relative %: 60.9 % (ref 43.0–77.0)
Platelets: 185 K/uL (ref 150.0–400.0)
RBC: 3.67 Mil/uL — ABNORMAL LOW (ref 3.87–5.11)
RDW: 13.1 % (ref 11.5–15.5)
WBC: 7.4 K/uL (ref 4.0–10.5)

## 2023-10-02 LAB — BASIC METABOLIC PANEL WITH GFR
BUN: 24 mg/dL — ABNORMAL HIGH (ref 6–23)
CO2: 29 meq/L (ref 19–32)
Calcium: 9.9 mg/dL (ref 8.4–10.5)
Chloride: 106 meq/L (ref 96–112)
Creatinine, Ser: 1.37 mg/dL — ABNORMAL HIGH (ref 0.40–1.20)
GFR: 35.56 mL/min — ABNORMAL LOW (ref >60.00)
Glucose, Bld: 149 mg/dL — ABNORMAL HIGH (ref 70–99)
Potassium: 4.4 meq/L (ref 3.5–5.1)
Sodium: 141 meq/L (ref 135–145)

## 2023-10-02 LAB — MICROALBUMIN / CREATININE URINE RATIO
Creatinine,U: 78.2 mg/dL
Microalb Creat Ratio: 284.8 mg/g — ABNORMAL HIGH (ref 0.0–30.0)
Microalb, Ur: 22.3 mg/dL — ABNORMAL HIGH (ref 0.0–1.9)

## 2023-10-02 NOTE — Patient Instructions (Addendum)
 GO TO THE LAB :  Get the blood work    Then, go to the front desk for the checkout Please make an appointment for a checkup in 4 months  You got a flu shot today Recommend COVID booster and a pneumonia shot at the pharmacy Recommend pneumonia shotAt the pharmacy  We are referring you to cardiology because your difficulty breathing. If you have increased symptoms seek medical attention  Continue checking your blood pressure regularly Blood pressure goal:  between 110/65 and  135/85. If it is consistently higher or lower, let me know

## 2023-10-02 NOTE — Progress Notes (Unsigned)
 Subjective:    Patient ID: [name], female    DOB: October 02, 1940, 83 y.o.   MRN: [id number]  DOS:  10/02/2023 Type of visit - description: Follow-up  Chronic medical problems addressed  Reports a long history of left upper chest pain, sharp or stinging.  Sometimes a very sore and tender to palpation.  Symptoms are on and off and not exertional.  Also reports DOE with minimal exertion, just doing light housework. Denies wheezing, palpitations. Has mild edema at the end of the day around the ankles Occasional cough No nausea vomiting, no diarrhea or blood in the stools.   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   Clotting disorder    Depression    DJD (degenerative joint disease)    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   Gastritis    GERD (gastroesophageal reflux disease)    Headache(784.0)    History of blood transfusion    Ive had 20 some; thru birth of children, loss of blood, last 2 were in ~ 1999 before my cancer surgery (09/16/2015)   History of hiatal hernia    History of shingles    Hx of colonic polyps    Hyperlipidemia    Hypertension    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HCC) 2000   Myocardial infarction (HCC)    1997   Pneumonia    X 1   Postoperative hematoma involving circulatory system following cardiac catheterization 09/17/2015   S/P cardiac cath 09/16/15 09/17/2015   Type II diabetes mellitus (HCC)    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[facility])    as a child   Venous insufficiency     Past Surgical History:  Procedure Laterality Date   BREAST BIOPSY Left  03/10/2022   MM LT BREAST BX W LOC DEV 1ST LESION IMAGE BX SPEC STEREO GUIDE 03/10/2022 [facility]   BREAST BIOPSY  05/01/2022   MM LT RADIOACTIVE SEED LOC MAMMO GUIDE 05/01/2022 [facility]   BREAST LUMPECTOMY WITH RADIOACTIVE SEED LOCALIZATION Left 05/02/2022   Procedure: LEFT BREAST LUMPECTOMY WITH RADIOACTIVE SEED LOCALIZATION;  Surgeon: Vanderbilt Ned, MD;  Location: [facility] OR;  Service: General;  Laterality: Left;   CARDIAC CATHETERIZATION  1997; 2008; 09/16/2015   CARDIAC CATHETERIZATION N/A 09/16/2015   Procedure: Right/Left Heart Cath and Coronary/Graft Angiography;  Surgeon: Dorn JINNY Court, MD;  Location: [facility] INVASIVE CV LAB;  Service: Cardiovascular;  Laterality: N/A;   CESAREAN SECTION  1966   COLONOSCOPY     CORONARY ARTERY BYPASS GRAFT  1997   CABG X5   EYE SURGERY     2 laser surgeries on left eye with implant   EYE SURGERY Bilateral    cataracts   FEMUR IM NAIL Left 08/28/2018   Procedure: INTRAMEDULLARY (IM) NAIL FEMORAL;  Surgeon: Fidel Rogue, MD;  Location: [facility] ORS;  Service: Orthopedics;  Laterality: Left;   FRACTURE SURGERY     HERNIA REPAIR     LAPAROSCOPIC ASSISTED VENTRAL HERNIA REPAIR  09/2008    with incarcerated colon;  Dr.  Ethyl   MOHS SURGERY  06/2016   [facility]   PATELLA FRACTURE SURGERY Left 11/1994   crushed my knee; put in a plate & 6 screws   POLYPECTOMY     RE-EXCISION OF BREAST LUMPECTOMY Left 05/18/2022   Procedure: RE-EXCISION OF LEFT BREAST LUMPECTOMY;  Surgeon: Vanderbilt Ned, MD;  Location: [facility];  Service: General;  Laterality: Left;  60 MIN ROOM 8   REFRACTIVE SURGERY Left 07/30/2015   resection of small bowel carcinoma  06/1998   Dr. Gretel   SHOULDER ARTHROSCOPY W/ ROTATOR CUFF REPAIR Right 1997   SMALL INTESTINE SURGERY     TONSILLECTOMY  1960s   TOTAL KNEE ARTHROPLASTY WITH HARDWARE REMOVAL Left 12/1994   (infex, hardware removed )- not replacement per patient   TUBAL LIGATION  1966    Current  Outpatient Medications  Medication Instructions   acetaminophen  (TYLENOL ) 500 mg, Oral, Every 6 hours PRN   amLODipine  (NORVASC ) 10 mg, Oral, Daily   aspirin  81 mg, Oral, Daily   atenolol  (TENORMIN ) 100 mg, Oral, Daily   atorvastatin  (LIPITOR) 20 MG tablet TAKE 1 TABLET(20 MG) BY MOUTH DAILY   Azelastine  HCl 137 MCG/SPRAY SOLN At bedtime PRN   Calcium  Carbonate (CALCIUM  500 PO) Take by mouth.   clopidogrel  (PLAVIX ) 75 mg, Oral, Daily   Continuous Glucose Sensor (FREESTYLE LIBRE 3 PLUS SENSOR) MISC 1 each, Does not apply, Continuous, Change every 15 days.   diazepam  (VALIUM ) 5 mg, Oral, Every 6 hours PRN   fluticasone  (FLONASE ) 50 MCG/ACT nasal spray SHAKE LIQUID AND USE 2 SPRAYS IN EACH NOSTRIL DAILY   glucose blood (ONETOUCH ULTRA) test strip USE AS DIRECTED THREE TIMES DAILY   Gvoke HypoPen  1-Pack 1 mg, Subcutaneous, As needed   insulin  NPH Human (NOVOLIN N) 100 UNIT/ML injection Inject 20 units into the skin every morning and inject 8 units into the skin at evening meal.   insulin  regular (NOVOLIN R) 5 Units, Subcutaneous, 2 times daily before meals   Insulin  Syringe-Needle U-100 (INSULIN  SYRINGE .5CC/31GX5/16) 31G X 5/16 0.5 ML MISC USE TO INJECT INSULIN  5 TIMES PER DAY   isosorbide  mononitrate (IMDUR ) 30 mg, Oral, Daily   metFORMIN  (GLUCOPHAGE ) 500 mg, Oral, Daily with supper   mometasone (ELOCON) 0.1 % cream 1 Application, Daily PRN   Multiple Vitamin (MULTIVITAMIN) capsule 1 capsule, Daily   omeprazole  (PRILOSEC) 20 MG capsule TAKE 1 CAPSULE(20 MG) BY MOUTH DAILY   ondansetron  (ZOFRAN ) 4 mg, Oral, Every 8 hours PRN   Ozempic  (0.25 or 0.5 MG/DOSE) 0.5 mg, Subcutaneous, Every Mon   Venclexta  100 mg, Oral, Daily, Tablets should be swallowed whole with a meal and a full glass of water.   VITAMIN D  PO 1 tablet, Daily       Objective:   Physical Exam BP 136/62   Pulse (!) 53   Temp 98.1 F (36.7 C) (Oral)   Resp 18   Ht 5' 5 (1.651 m)   Wt 174 lb 6 oz (79.1 kg)   SpO2 95%    BMI 29.02 kg/m  General:   Well developed, NAD, BMI noted. HEENT:  Normocephalic . Face symmetric, atraumatic Lungs:  CTA B Normal respiratory effort, no intercostal retractions, no accessory muscle use. Heart: RRR,  no murmur.  Lower extremities: no pretibial edema bilaterally  Skin: Not pale. Not jaundice Neurologic:  alert & oriented X3.  Speech normal, gait appropriate for age and unassisted Psych--  Cognition and judgment appear intact.  Cooperative with normal attention span  and concentration.  Behavior appropriate. No anxious or depressed appearing.      Assessment   Assessment DM , +retinopathy, per endo- HTN Hyperlipidemia CKD: creat ~1.4  (GFR ~ 38) US  09-2019: No obstruction, bilateral cortical thinning and a small renal cysts Depression/anxiety: tranxene  qhs prn (takes rarely) MSK: -DJD  - Osteoporosis ---T score -0.8 on December 2017, h/o a foot FX d/t walking years ago --- (L) Hip Fx, 08/2018: started fosamax , switch to Prolia  d/t CKD and diff swallowing, first dose 12-2019 Hem/Onc: Dr Timmy  -CLL -iron deficiency anemia: IV iron 2018 -B12 def -L DCIS, surgery 2024. CAD, Dr Court MI 11 >> CABG, cath 12-13-2006, myoview  2012 no ischemic CP: Stress test 08/05/2015, indeterminate risk study, + lateral ischemia: Cardiac catheterization 09/16/2015: Rx medical Venous insuff GI:  Dr Shila ---GERD, IBS, h/o Gastritis ---H/o ulcerative colitis as a child H/o shingles  BCC Dr Ivin, bx 04-2015, MOH's 06-2016   PLAN BMP CBC DM: Per Endo, well-controlled HTN: Ambulatory BPs in the 120, 130 range.Continue amlodipine  10 mg, atenolol , BMP and CBC CAD: Has chronic chest pain as described above, states she previously discussed with cardiology and it was felt not to be cardiac. EKG today: Bradycardia, RBBB, unchanged DOE: Reports DOE with normal activities.On 12/14/2020 she also reported DOE but with 30 minutes of exertion, now worse. Last Myoview  2021, last  echo April 2025: EF 55 to 60%. Could be angina equivalent, refer back to cardiology.  MSK: Since LOV, saw orthopedics.  Issue seems to be spine related, to see a spine doctor soon  CLL: LOV oncology 07/17/2023, on no active treatment.  Next visit around 10/2023 Osteoporosis: On Prolia , last shot 07/23/2023   History of DCIS: Status post multiple surgeries, did not wish radiation therapy for this.  Saw surgery 11/2022, next visit 11/2023. Last MMG 04/2022, order diagnostic mammogram Flu shot today.  Recommend a COVID booster and Tdap PNM 20. RTC 4 months  6 DM: Per Endo HTN: Ambulatory BPs 130s/60s.  Last BMP stable, continue present care. LE edema: Occasional lower extremity edema, recommend low-salt diet, leg elevation.  Reassess periodically MSK: L hip pain, see LOV, x-ray no acute, back pain resolved. R hip pain: This is a chronic issue, slightly worse, likely due to DJD, refer to Ortho. High cholesterol: On atorvastatin , last LDL satisfactory Rectal bleeding: See note from 04/13/2023, was referred to GI, that has not been scheduled.  Last colonoscopy 2019, + polyps.  Symptoms are largely resolved, hemoglobin is stable.  We agreed on observation. RTC 3 months

## 2023-10-03 NOTE — Assessment & Plan Note (Signed)
 DM: Per Endo, well-controlled HTN: Ambulatory BPs in the 120, 130 range.Continue amlodipine  10 mg, atenolol , BMP and CBC CAD: Has chronic chest pain as described above, states she previously discussed with cardiology and it was felt not to be cardiac. EKG today: Bradycardia, RBBB, unchanged DOE: Reports DOE with minimal activities. On 12/14/2020 she also reported DOE but with 30 minutes of exertion, thus sxs are now worsening.  Last Myoview  2021, last echo April 2025: EF 55 to 60%.  DDx includes anginal equivalent thus will refer to cardiology for possible evaluation.  MSK: Since LOV, saw orthopedics.  Issue seems to be spine related, to see a spine doctor soon CLL: LOV oncology 07/17/2023, on no active treatment.  Next visit around 10/2023 Osteoporosis: On Prolia , last shot 07/23/2023 History of DCIS: Status post multiple surgeries, did not wish radiation therapy for this.  Saw surgery 11/2022, next visit 11/2023. Last MMG 04/2022, order a Dx MMG today Preventive care: Flu shot today.  Recommend a COVID booster and Tdap PNM 20. RTC 4 months

## 2023-10-04 ENCOUNTER — Ambulatory Visit: Payer: Self-pay | Admitting: Internal Medicine

## 2023-10-04 ENCOUNTER — Ambulatory Visit
Admission: RE | Admit: 2023-10-04 | Discharge: 2023-10-04 | Disposition: A | Discharge status: Home / Self Care | Source: Ambulatory Visit | Attending: Internal Medicine | Admitting: Internal Medicine

## 2023-10-04 DIAGNOSIS — R928 Other abnormal and inconclusive findings on diagnostic imaging of breast: Secondary | ICD-10-CM | POA: Diagnosis not present

## 2023-10-08 ENCOUNTER — Ambulatory Visit: Payer: Self-pay

## 2023-10-08 ENCOUNTER — Telehealth: Payer: Self-pay | Admitting: Internal Medicine

## 2023-10-08 DIAGNOSIS — E1122 Type 2 diabetes mellitus with diabetic chronic kidney disease: Secondary | ICD-10-CM

## 2023-10-08 MED ORDER — LOSARTAN POTASSIUM 50 MG PO TABS: 50.0000 mg | ORAL_TABLET | Freq: Every day | ORAL | 0 refills | Status: DC

## 2023-10-08 NOTE — Telephone Encounter (Signed)
 Rx sent, BMP ordered. See triage note for further information.

## 2023-10-08 NOTE — Telephone Encounter (Signed)
 Copied from CRM [id number]. Topic: Clinical - Lab/Test Results >> Oct 08, 2023  4:23 PM Burnard DEL wrote: Reason for RMF:Ejupzwu returned call regarding lab results.Message from provider was relayed to patient.Patient had additional questions.

## 2023-10-08 NOTE — Telephone Encounter (Signed)
 Labs okay except for + microalbumin.BP was 136/62. ARB's d/c/ after admission for C. Difficile--- April 2025. Now has + microalbumin. Please call patient: Due to protein in the urine needs to restart ARB's, losartan  50 mg daily.  Send a prescription. Monitor BPs at home if possible Nurse visit in 2 weeks for BP check and BMP

## 2023-10-08 NOTE — Telephone Encounter (Signed)
 FYI Only or Action Required?: Action required by provider: lab or test result follow-up needed.  Patient was last seen in primary care on 10/02/2023 by Sara Aloysius BRAVO, MD.  Called Nurse Triage reporting Insect Bite and Results.  Symptoms began today.  Interventions attempted: Other: site cleaning.  Symptoms are: unchanged.  Triage Disposition: Home Care  Patient/caregiver understands and will follow disposition?: Yes         Copied from CRM [id number]. Topic: Clinical - Red Word Triage >> Oct 08, 2023  4:25 PM [name] wrote: Red Word that prompted transfer to Nurse Triage: yellow jacket stings on finger,finger has turned red Reason for Disposition  Normal local reaction to bee, wasp, or yellow jacket sting    Pt initially calling back to review recent lab results, but as she missed a call from clinic, she got stuck by yellow jackets x 2 on L hand. Pt denies any difficulty breathing, tongue/lip swelling. Home care given.  Of note, triager did review results below: Sara Aloysius BRAVO, MD    10/08/23  1:55 PM  Please call patient: Due to protein in the urine needs to restart ARB's, losartan  50 mg daily.  Send a prescription. Monitor BPs at home if possible Nurse visit in 2 weeks for BP check and BMP   Patient verbalized understanding and is expecting Rx to be called into pharmacy.  Due pt experiencing pain d/t stings, triager was unable to schedule F/U visit. Will forward to PCP to schedule nurse f/u visit.  Answer Assessment - Initial Assessment Questions 1. TYPE: What type of sting was it? (e.g., bee, yellow jacket, unknown)      Yellow jacket 2. ONSET: When did it occur?      Just before call 3. LOCATION: Where is the sting located?  How many stings?     2 stings, ring finger and thumb - L hand 4. SWELLING SIZE: How big is the swelling? (e.g., inches or cm)     *No Answer* 5. REDNESS: Is the area red or pink? If Yes, ask: What size is area of redness? (e.g.,  inches or cm). When did the redness start?     Red as a beet 6. PAIN: Is there any pain? If Yes, ask: How bad is it?  (Scale 0-10; or none, mild, moderate, severe)     yes 7. ITCHING: Is there any itching? If Yes, ask: How bad is it?      *No Answer* 8. RESPIRATORY DISTRESS: Describe your breathing.     denies 9. PRIOR REACTIONS: Have you had any severe allergic reactions to stings in the past? If Yes, ask: What happened?     *No Answer* 10. OTHER SYMPTOMS: Do you have any other symptoms? (e.g., abdomen pain, face or tongue swelling, new rash elsewhere, vomiting)       *No Answer* 11. PREGNANCY: Is there any chance you are pregnant? When was your last menstrual period?       *No Answer*  Protocols used: Bee or Yellow Jacket Sting-A-AH

## 2023-10-08 NOTE — Telephone Encounter (Signed)
 LMOM asking for call back. Okay for E2C2 to discuss. (Please let us  know when she is informed for us  to send losartan  and order BMP).

## 2023-10-08 NOTE — Telephone Encounter (Signed)
 Error CRM sent. Agent/triager did not schedule 2 week bp check and lab appt.

## 2023-10-17 ENCOUNTER — Encounter: Payer: Self-pay | Admitting: Family

## 2023-10-17 ENCOUNTER — Inpatient Hospital Stay: Attending: Hematology & Oncology

## 2023-10-17 ENCOUNTER — Inpatient Hospital Stay (HOSPITAL_BASED_OUTPATIENT_CLINIC_OR_DEPARTMENT_OTHER): Admitting: Family

## 2023-10-17 VITALS — BP 154/57 | HR 63 | Temp 97.6°F | Resp 18 | Ht 65.0 in | Wt 176.0 lb

## 2023-10-17 DIAGNOSIS — Z85038 Personal history of other malignant neoplasm of large intestine: Secondary | ICD-10-CM | POA: Diagnosis not present

## 2023-10-17 DIAGNOSIS — D509 Iron deficiency anemia, unspecified: Secondary | ICD-10-CM | POA: Diagnosis not present

## 2023-10-17 DIAGNOSIS — K909 Intestinal malabsorption, unspecified: Secondary | ICD-10-CM

## 2023-10-17 DIAGNOSIS — Q999 Chromosomal abnormality, unspecified: Secondary | ICD-10-CM | POA: Insufficient documentation

## 2023-10-17 DIAGNOSIS — I2510 Atherosclerotic heart disease of native coronary artery without angina pectoris: Secondary | ICD-10-CM

## 2023-10-17 DIAGNOSIS — C9110 Chronic lymphocytic leukemia of B-cell type not having achieved remission: Secondary | ICD-10-CM | POA: Diagnosis not present

## 2023-10-17 DIAGNOSIS — Z9221 Personal history of antineoplastic chemotherapy: Secondary | ICD-10-CM | POA: Diagnosis not present

## 2023-10-17 DIAGNOSIS — D508 Other iron deficiency anemias: Secondary | ICD-10-CM | POA: Diagnosis not present

## 2023-10-17 DIAGNOSIS — N1832 Chronic kidney disease, stage 3b: Secondary | ICD-10-CM

## 2023-10-17 LAB — CBC WITH DIFFERENTIAL (CANCER CENTER ONLY)
Abs Immature Granulocytes: 0.02 K/uL (ref 0.00–0.07)
Basophils Absolute: 0 K/uL (ref 0.0–0.1)
Basophils Relative: 0 %
Eosinophils Absolute: 0.2 K/uL (ref 0.0–0.5)
Eosinophils Relative: 3 %
HCT: 33.9 % — ABNORMAL LOW (ref 36.0–46.0)
Hemoglobin: 11.4 g/dL — ABNORMAL LOW (ref 12.0–15.0)
Immature Granulocytes: 0 %
Lymphocytes Relative: 32 %
Lymphs Abs: 2.2 K/uL (ref 0.7–4.0)
MCH: 32.6 pg (ref 26.0–34.0)
MCHC: 33.6 g/dL (ref 30.0–36.0)
MCV: 96.9 fL (ref 80.0–100.0)
Monocytes Absolute: 0.7 K/uL (ref 0.1–1.0)
Monocytes Relative: 11 %
Neutro Abs: 3.8 K/uL (ref 1.7–7.7)
Neutrophils Relative %: 54 %
Platelet Count: 185 K/uL (ref 150–400)
RBC: 3.5 MIL/uL — ABNORMAL LOW (ref 3.87–5.11)
RDW: 12.8 % (ref 11.5–15.5)
WBC Count: 6.9 K/uL (ref 4.0–10.5)
nRBC: 0 % (ref 0.0–0.2)

## 2023-10-17 LAB — CMP (CANCER CENTER ONLY)
ALT: 19 U/L (ref 0–44)
AST: 24 U/L (ref 15–41)
Albumin: 4.5 g/dL (ref 3.5–5.0)
Alkaline Phosphatase: 71 U/L (ref 38–126)
Anion gap: 13 (ref 5–15)
BUN: 19 mg/dL (ref 8–23)
CO2: 24 mmol/L (ref 22–32)
Calcium: 9.8 mg/dL (ref 8.9–10.3)
Chloride: 108 mmol/L (ref 98–111)
Creatinine: 1.34 mg/dL — ABNORMAL HIGH (ref 0.44–1.00)
GFR, Estimated: 39 mL/min — ABNORMAL LOW (ref >60)
Glucose, Bld: 156 mg/dL — ABNORMAL HIGH (ref 70–99)
Potassium: 4 mmol/L (ref 3.5–5.1)
Sodium: 144 mmol/L (ref 135–145)
Total Bilirubin: 0.5 mg/dL (ref 0.0–1.2)
Total Protein: 7.3 g/dL (ref 6.5–8.1)

## 2023-10-17 LAB — IRON AND IRON BINDING CAPACITY (CC-WL,HP ONLY)
Iron: 83 ug/dL (ref 28–170)
Saturation Ratios: 27 % (ref 10.4–31.8)
TIBC: 309 ug/dL (ref 250–450)
UIBC: 226 ug/dL

## 2023-10-17 LAB — SAVE SMEAR(SSMR), FOR PROVIDER SLIDE REVIEW

## 2023-10-17 LAB — FERRITIN: Ferritin: 467 ng/mL — ABNORMAL HIGH (ref 11–307)

## 2023-10-17 NOTE — Progress Notes (Signed)
 Hematology and Oncology Follow Up Visit  [name] [id number] 08/29/1940 83 y.o. 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 Overall she is doing well but does have chronic fatigue unchanged from baseline. She has mild SOB with over exertion and occasional dizziness.  WBC count and differential remain stable. She remains off Venetoclax .  No adenopathy noted on exam.  No issue with frequent or recurrent infections.  No fever, chills, n/v, cough, rash, chest pain, palpitations, abdominal pain or changes in bowel or bladder habits.  No tenderness, numbness or tingling in her extremities.  She notes fluid retention in her lower extremities if she sits or stands for an extended time. This resolves with propping her feet up on a stool. No swelling noted on today's exam. Pedal pulses are 2+.  No syncope or falls reported.  Appetite and hydration are good weight is stable at 176 lbs.   ECOG Performance Status: 1 - Symptomatic but completely ambulatory  Medications:  Allergies as of 10/17/2023       Reactions   Codeine Other (See Comments)   REACTION: makes her nervous, orTylenol #3   Ibuprofen Other (See Comments)   REACTION: nervous   Meperidine  Hcl Nausea And Vomiting   Naproxen Sodium Other (See Comments)   Or Advil REACTION: nervous   Tramadol  Other (See Comments)   Insomnia    Tylenol  With Codeine #3 [acetaminophen -codeine] Nausea And Vomiting        Medication List         Accurate as of October 17, 2023  9:45 AM. If you have any questions, ask your nurse or doctor.          STOP taking these medications    Venclexta  100 MG tablet Generic drug: venetoclax  Stopped by: Lauraine Pepper       TAKE these medications    acetaminophen  500 MG tablet Commonly known as: TYLENOL  Take 1 tablet (500 mg total) by mouth every 6 (six) hours as needed.   amLODipine  10 MG tablet Commonly known as: NORVASC  Take 1 tablet (10 mg total) by mouth daily.   aspirin  81 MG chewable tablet Chew 1 tablet (81 mg total) by mouth daily.   atenolol  100 MG tablet Commonly known as: TENORMIN  Take 1 tablet (100 mg total) by mouth daily.   atorvastatin  20 MG tablet Commonly known as: LIPITOR TAKE 1 TABLET(20 MG) BY MOUTH DAILY   Azelastine  HCl 137 MCG/SPRAY Soln Place into the nose at bedtime as needed.   CALCIUM  500 PO Take by mouth.   clopidogrel  75 MG tablet Commonly known as: PLAVIX  Take 1 tablet (75 mg total) by mouth daily.   diazepam  5 MG tablet Commonly known as: VALIUM  Take 1 tablet (5 mg total) by mouth every 6 (six) hours as needed for anxiety.   fluticasone  50 MCG/ACT nasal spray Commonly known as: FLONASE  SHAKE LIQUID AND USE 2 SPRAYS IN EACH NOSTRIL DAILY   FreeStyle Libre 3 Plus  Sensor Misc 1 each by Does not apply route continuous. Change every 15 days.   Gvoke HypoPen  1-Pack 1 MG/0.2ML Soaj Generic drug: Glucagon  Inject 1 mg into the skin as needed (low blood sugar with impaired consciousness).   insulin  NPH Human 100 UNIT/ML injection Commonly known as: NOVOLIN N Inject 20 units into the skin every morning and inject 8 units into the skin at evening meal.   insulin  regular 100 units/mL injection Commonly known as: NOVOLIN R Inject 0.05 mLs (5 Units total) into the skin 2 (two) times daily before a meal.   INSULIN  SYRINGE .5CC/31GX5/16 31G X 5/16 0.5 ML Misc USE TO INJECT INSULIN  5 TIMES PER DAY   isosorbide  mononitrate 30  MG 24 hr tablet Commonly known as: IMDUR  TAKE 1 TABLET BY MOUTH EVERY DAY   losartan  50 MG tablet Commonly known as: COZAAR  Take 1 tablet (50 mg total) by mouth daily.   metFORMIN  500 MG tablet Commonly known as: GLUCOPHAGE  Take 1 tablet (500 mg total) by mouth daily with supper.   mometasone 0.1 % cream Commonly known as: ELOCON Apply 1 Application topically daily as needed (itchy ears). Use as directed   multivitamin capsule Take 1 capsule by mouth daily.  Centrum   omeprazole  20 MG capsule Commonly known as: PRILOSEC TAKE 1 CAPSULE(20 MG) BY MOUTH DAILY   ondansetron  4 MG tablet Commonly known as: Zofran  Take 1 tablet (4 mg total) by mouth every 8 (eight) hours as needed for nausea or vomiting.   OneTouch Ultra test strip Generic drug: glucose blood USE AS DIRECTED THREE TIMES DAILY   Ozempic  (0.25 or 0.5 MG/DOSE) 2 MG/3ML Sopn Generic drug: Semaglutide (0.25 or 0.5MG /DOS) Inject 0.5 mg into the skin every Monday.   VITAMIN D  PO Take 1 tablet by mouth daily.        Allergies:  Allergies  Allergen Reactions   Codeine Other (See Comments)    REACTION: makes her nervous, orTylenol #3   Ibuprofen Other (See Comments)    REACTION: nervous   Meperidine  Hcl Nausea And Vomiting   Naproxen Sodium Other (See Comments)    Or Advil  REACTION: nervous   Tramadol  Other (See Comments)    Insomnia    Tylenol  With Codeine #3 [Acetaminophen -Codeine] Nausea And Vomiting    Past Medical History, Surgical history, Social history, and Family History were reviewed and updated.  Review of Systems: All other 10 point review of systems is negative.   Physical Exam:  height is 5' 5 (1.651 m) and weight is 176 lb (79.8 kg). Her oral temperature is 97.6 F (36.4 C). Her blood pressure is 154/57 (abnormal) and her pulse is 63. Her respiration is 18 and oxygen saturation is 98%.   Wt Readings from Last 3 Encounters:  10/17/23 176 lb (79.8 kg)  10/02/23 174 lb 6 oz (79.1 kg)   09/04/23 173 lb (78.5 kg)    Ocular: Sclerae unicteric, pupils equal, round and reactive to light Ear-nose-throat: Oropharynx clear, dentition fair Lymphatic: No cervical, supraclavicular or axillary adenopathy Lungs no rales or rhonchi, good excursion bilaterally Heart regular rate and rhythm, no murmur appreciated Abd soft, nontender, positive bowel sounds MSK no focal spinal tenderness, no joint edema Neuro: non-focal, well-oriented, appropriate affect Breasts: Deferred   Lab Results  Component Value Date   WBC 7.4 10/02/2023   HGB 11.9 (L) 10/02/2023   HCT 35.8 (L) 10/02/2023   MCV 97.4 10/02/2023   PLT 185.0 10/02/2023   Lab Results  Component Value Date  FERRITIN 453 (H) 07/17/2023   IRON 82 07/17/2023   TIBC 270 07/17/2023   UIBC 188 07/17/2023   IRONPCTSAT 30 07/17/2023   Lab Results  Component Value Date   RETICCTPCT 2.3 03/20/2023   RBC 3.67 (L) 10/02/2023   Lab Results  Component Value Date   KPAFRELGTCHN 32.8 (H) 05/16/2021   LAMBDASER 26.4 (H) 05/16/2021   KAPLAMBRATIO 1.24 05/16/2021   Lab Results  Component Value Date   IGGSERUM 845 08/22/2022   IGA 185 08/22/2022   IGMSERUM 37 08/22/2022   Lab Results  Component Value Date   TOTALPROTELP 6.5 11/18/2020   ALBUMINELP 3.6 11/18/2020   A1GS 0.3 11/18/2020   A2GS 0.9 11/18/2020   BETS 1.0 11/18/2020   BETA2SER 6.2 02/25/2014   GAMS 0.7 11/18/2020   MSPIKE Not Observed 11/18/2020   SPEI * 02/25/2014     Chemistry      Component Value Date/Time   NA 141 10/02/2023 1104   NA 147 (H) 11/03/2016 1018   NA 143 08/30/2016 0954   K 4.4 10/02/2023 1104   K 3.9 11/03/2016 1018   K 4.5 08/30/2016 0954   CL 106 10/02/2023 1104   CL 110 (H) 11/03/2016 1018   CO2 29 10/02/2023 1104   CO2 27 11/03/2016 1018   CO2 24 08/30/2016 0954   BUN 24 (H) 10/02/2023 1104   BUN 25 (H) 11/03/2016 1018   BUN 20.5 08/30/2016 0954   CREATININE 1.37 (H) 10/02/2023 1104   CREATININE 1.43 (H) 07/17/2023 0917    CREATININE 1.13 (H) 04/13/2023 1523   CREATININE 1.4 (H) 08/30/2016 0954      Component Value Date/Time   CALCIUM  9.9 10/02/2023 1104   CALCIUM  10.1 11/03/2016 1018   CALCIUM  9.8 08/30/2016 0954   ALKPHOS 72 07/17/2023 0917   ALKPHOS 112 (H) 11/03/2016 1018   ALKPHOS 113 08/30/2016 0954   AST 17 07/17/2023 0917   AST 23 08/30/2016 0954   ALT 13 07/17/2023 0917   ALT 29 11/03/2016 1018   ALT 20 08/30/2016 0954   BILITOT 0.4 07/17/2023 0917   BILITOT 0.38 08/30/2016 0954       Impression and Plan: Ms. Scally is a very pleasant 83 yo caucasian female with CLL. So far, we have not had any negative prognostic markers. She does have the 13q-chromosomal abnormality.  Her counts remain stable.  Continue to hold Venetoclax .  Follow-up again in 3 months.   Lauraine Pepper, NP 10/15/20259:45 AM

## 2023-10-22 DIAGNOSIS — M5416 Radiculopathy, lumbar region: Secondary | ICD-10-CM | POA: Diagnosis not present

## 2023-10-22 DIAGNOSIS — M545 Low back pain, unspecified: Secondary | ICD-10-CM | POA: Diagnosis not present

## 2023-10-22 DIAGNOSIS — I700 Atherosclerosis of aorta: Secondary | ICD-10-CM | POA: Diagnosis not present

## 2023-10-22 DIAGNOSIS — E7849 Other hyperlipidemia: Secondary | ICD-10-CM | POA: Diagnosis not present

## 2023-10-23 NOTE — Progress Notes (Unsigned)
 Patient comes in today for blood pressure check per Dr. Amon.  10/08/2023:  Due to protein in the urine needs to restart ARB's, losartan  50 mg daily.  Send a prescription. Monitor BPs at home if possible Nurse visit in 2 weeks for BP check and BMP  [name] is taking Losartan  50 mg, Amlodipine  10 mg, atenolol  100 mg and Imdur  30 mg. [name] denies any missed doses, side effects, headaches, chest pain, palpitations, dizziness, or shortness of breath.   [name] has/hasn't*** been checking blood pressure readings at home.  Her first blood pressure reading today is: ***. After sitting, her second reading is: ***.   I spoke with Aloysius Amon, MD who advised ***.

## 2023-10-24 ENCOUNTER — Ambulatory Visit

## 2023-10-24 ENCOUNTER — Other Ambulatory Visit (INDEPENDENT_AMBULATORY_CARE_PROVIDER_SITE_OTHER)

## 2023-10-24 VITALS — BP 144/74 | HR 50

## 2023-10-24 DIAGNOSIS — Z794 Long term (current) use of insulin: Secondary | ICD-10-CM | POA: Diagnosis not present

## 2023-10-24 DIAGNOSIS — Z23 Encounter for immunization: Secondary | ICD-10-CM | POA: Diagnosis not present

## 2023-10-24 DIAGNOSIS — E1122 Type 2 diabetes mellitus with diabetic chronic kidney disease: Secondary | ICD-10-CM

## 2023-10-24 DIAGNOSIS — I10 Essential (primary) hypertension: Secondary | ICD-10-CM | POA: Diagnosis not present

## 2023-10-24 LAB — BASIC METABOLIC PANEL WITH GFR
BUN: 19 mg/dL (ref 6–23)
CO2: 25 meq/L (ref 19–32)
Calcium: 9.5 mg/dL (ref 8.4–10.5)
Chloride: 105 meq/L (ref 96–112)
Creatinine, Ser: 1.36 mg/dL — ABNORMAL HIGH (ref 0.40–1.20)
GFR: 35.86 mL/min — ABNORMAL LOW (ref >60.00)
Glucose, Bld: 176 mg/dL — ABNORMAL HIGH (ref 70–99)
Potassium: 4.3 meq/L (ref 3.5–5.1)
Sodium: 141 meq/L (ref 135–145)

## 2023-10-24 MED ORDER — LOSARTAN POTASSIUM 25 MG PO TABS: 25.0000 mg | ORAL_TABLET | Freq: Every day | ORAL | 0 refills | Status: DC

## 2023-10-24 NOTE — Progress Notes (Addendum)
 Started losartan  due to + microalbumin, since then her BPs are higher, unclear why. Ambulatory BPs previously 137, 129. Since losartan  started: 149, 147, 150 with diastolic BP in the 70s Plan:  BMP today Decrease losartan  to 25 mg only.  Send a prescription Continue monitor BPs and call with readings in 1 month

## 2023-10-28 ENCOUNTER — Ambulatory Visit: Payer: Self-pay | Admitting: Internal Medicine

## 2023-11-09 ENCOUNTER — Other Ambulatory Visit: Payer: Self-pay | Admitting: Internal Medicine

## 2023-11-12 ENCOUNTER — Encounter: Payer: Self-pay | Admitting: Endocrinology

## 2023-11-12 ENCOUNTER — Ambulatory Visit: Payer: Self-pay | Admitting: Endocrinology

## 2023-11-12 ENCOUNTER — Ambulatory Visit: Admitting: Endocrinology

## 2023-11-12 VITALS — BP 130/50 | HR 67 | Resp 16 | Ht 65.0 in | Wt 175.2 lb

## 2023-11-12 DIAGNOSIS — N1832 Chronic kidney disease, stage 3b: Secondary | ICD-10-CM | POA: Diagnosis not present

## 2023-11-12 DIAGNOSIS — M810 Age-related osteoporosis without current pathological fracture: Secondary | ICD-10-CM | POA: Diagnosis not present

## 2023-11-12 DIAGNOSIS — E1122 Type 2 diabetes mellitus with diabetic chronic kidney disease: Secondary | ICD-10-CM

## 2023-11-12 DIAGNOSIS — Z794 Long term (current) use of insulin: Secondary | ICD-10-CM | POA: Diagnosis not present

## 2023-11-12 LAB — POCT GLYCOSYLATED HEMOGLOBIN (HGB A1C): Hemoglobin A1C: 6.4 % — AB (ref 4.0–5.6)

## 2023-11-12 MED ORDER — INSULIN SYRINGE 31G X 5/16" 0.5 ML MISC: 5 refills | Status: DC

## 2023-11-12 MED ORDER — INSULIN NPH (HUMAN) (ISOPHANE) 100 UNIT/ML ~~LOC~~ SUSP: SUBCUTANEOUS | 3 refills | Status: AC

## 2023-11-12 MED ORDER — INSULIN SYRINGE 31G X 5/16" 0.5 ML MISC: 5 refills | Status: AC

## 2023-11-12 MED ORDER — INSULIN REGULAR HUMAN 100 UNIT/ML IJ SOLN: INTRAMUSCULAR | 3 refills | Status: AC

## 2023-11-12 NOTE — Progress Notes (Signed)
 Outpatient Endocrinology Note Myrah Strawderman, MD  11/12/23  Patient's Name: [name]    DOB: 1940-12-20    MRN: [id number]                                                    REASON OF VISIT: Follow up for type 2 diabetes mellitus /osteoporosis  PCP: Amon Aloysius BRAVO, MD  HISTORY OF PRESENT ILLNESS:   [name] is a 83 y.o. old female with past medical history listed below, is here for follow up of type 2 diabetes mellitus / osteoporosis.   Pertinent Diabetes History: Patient was diagnosed with type 2 diabetes mellitus in 1998.   Chronic Diabetes Complications : Retinopathy: yes following with ophthalmology.   Nephropathy: CKD 3B, on losartan   Peripheral neuropathy: no Coronary artery disease: yes, CABG Stroke: no  Relevant comorbidities and cardiovascular risk factors: Obesity: no Body mass index is 29.15 kg/m.  Hypertension: yes Hyperlipidemia. Yes, on a statin  Current / Home Diabetic regimen includes: Novolin N 20 units in the morning and 8 units in the evening. Regular insulin  5 units with breakfast and 5 units with supper. Metformin  500 mg daily. Ozempic  0.5 mg weekly.  Prior diabetic medications: Victoza  in the past, changed to Ozempic  due to insurance preference in ~ May 2024. Insulin  doses were decreased in October due to hypoglycemia.  Glycemic data:   CONTINUOUS GLUCOSE MONITORING SYSTEM (CGMS) INTERPRETATION: At today's visit, we reviewed CGM downloads. The full report is scanned in the media. Reviewing the CGM trends, blood glucose are as follows:  FreeStyle Libre 3 CGM-  Sensor Download (Sensor download was reviewed and summarized below.) Dates: November 2 to November 15 , 2025, 14 days. Sensor Average: 127 Glucose Management Indicator: 6.6%  % data captured: 62%    Previous:    Interpretation: Mostly acceptable blood sugar with occasional mild hypoglycemia overnight with blood sugar in 60s sometime around midnight and sometime in  the early morning, blood sugar mostly acceptable during the daytime.  No persistent hypoglycemia.  No concerning hyperglycemia.  Hypoglycemia: Patient has no hypoglycemic episodes. Patient has hypoglycemia ? awareness.  Factors modifying glucose control: 1.  Diabetic diet assessment: 2-3 meals a day, she sometimes does not eat dinner.  At suppertime she usually does like to snack.  2.  Staying active or exercising: No formal exercise.  3.  Medication compliance: compliant all of the time.  # Osteoporosis : -Osteoporosis was being managed by primary care provider and referred to endocrinology after her DEXA scan in July 18, 2022.  -DEXA scan in July 18, 2022 T-score AP spine L1-L4 -0.7  and Right hip total -2.7 consistent with osteoporosis. ( Left hip not used due to surgery)  DEX scan in 12/2015, T-Score: AP spine -0.8, RFN -2.5, LFN -2.9 consistent with osteoporosis. (GE lunar)  Patient had remote history of foot fracture due to walking.  She had a history of left hip fracture in August 2020.  Treatment history: In August 2020 started on Fosamax  and later switched to Prolia  due to CKD and difficulty swallowing, first dose of Prolia  in December 2021, she has been Prolia  every 6 months without missing and last dose was in July 2025.  She is currently taking vitamin D3 1000 international unit daily, multivitamin 1 tablet daily.  She is taking additional calcium .  She eats yogurt daily and fair amount of cheese.  CTX in August 2024 was 175 normal.  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  During the chemotherapy course she had received multiple course of dexamethasone , which is on 6 factor for continued bone loss.  Patient has remote history of colon cancer. DCIS left breast.   Interval history  CGM data as reviewed above, occasional mild hypoglycemia.  Diabetes regimen as reviewed and noted above.  Hemoglobin A1c 6.4% today.  She has been taking calcium   and vitamin D  supplement.  No fall and fracture.  No other complaints today.  REVIEW OF SYSTEMS As per history of present illness.   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   Gastritis    GERD (gastroesophageal reflux disease)    Headache(784.0)    History of blood transfusion    Ive had 20 some; thru birth of children, loss of blood, last 2 were in ~ 1999 before my cancer surgery (09/16/2015)   History of hiatal hernia    History of shingles    Hx of colonic polyps    Hyperlipidemia    Hypertension    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HCC) 2000   Myocardial infarction (HCC)    1997   Pneumonia    X 1   Postoperative hematoma involving circulatory system following cardiac catheterization 09/17/2015   S/P cardiac cath 09/16/15 09/17/2015   Type II diabetes mellitus (HCC)    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[facility])    as a child   Venous insufficiency     PAST SURGICAL HISTORY: Past Surgical History:  Procedure Laterality Date   BREAST BIOPSY Left 03/10/2022   MM LT BREAST BX W LOC DEV 1ST LESION IMAGE BX SPEC STEREO GUIDE 03/10/2022 [facility]   BREAST BIOPSY  05/01/2022   MM LT RADIOACTIVE SEED LOC MAMMO GUIDE 05/01/2022 [facility]   BREAST LUMPECTOMY Left 04/2022   BREAST LUMPECTOMY WITH RADIOACTIVE SEED LOCALIZATION Left 05/02/2022   Procedure: LEFT BREAST LUMPECTOMY WITH RADIOACTIVE SEED LOCALIZATION;  Surgeon: Vanderbilt Ned, MD;  Location: [facility];  Service: General;   Laterality: Left;   CARDIAC CATHETERIZATION  1997; 2008; 09/16/2015   CARDIAC CATHETERIZATION N/A 09/16/2015   Procedure: Right/Left Heart Cath and Coronary/Graft Angiography;  Surgeon: Dorn JINNY Court, MD;  Location: [facility];  Service: Cardiovascular;  Laterality: N/A;   CESAREAN SECTION  1966   COLONOSCOPY     CORONARY ARTERY BYPASS GRAFT  1997   CABG X5   EYE SURGERY     2 laser surgeries on left eye with implant   EYE SURGERY Bilateral    cataracts   FEMUR IM NAIL Left 08/28/2018   Procedure: INTRAMEDULLARY (IM) NAIL FEMORAL;  Surgeon: Fidel Rogue, MD;  Location: [facility];  Service: Orthopedics;  Laterality: Left;   FRACTURE SURGERY     HERNIA REPAIR  LAPAROSCOPIC ASSISTED VENTRAL HERNIA REPAIR  09/2008    with incarcerated colon;  Dr. 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 Gretel   SHOULDER ARTHROSCOPY W/ ROTATOR CUFF REPAIR Right 1997   SMALL INTESTINE SURGERY     TONSILLECTOMY  1960s   TOTAL KNEE ARTHROPLASTY WITH HARDWARE REMOVAL Left 12/1994   (infex, hardware removed )- not replacement per patient   TUBAL LIGATION  1966    ALLERGIES: Allergies  Allergen Reactions   Codeine Other (See Comments)    REACTION: makes her nervous, orTylenol #3   Ibuprofen Other (See Comments)    REACTION: nervous   Meperidine  Hcl Nausea And Vomiting   Naproxen Sodium Other (See Comments)    Or Advil  REACTION: nervous   Tramadol  Other (See Comments)    Insomnia    Tylenol  With Codeine #3 [Acetaminophen -Codeine] Nausea And Vomiting    FAMILY HISTORY:   Family History  Problem Relation Age of Onset   Heart disease Mother 91   Heart disease Father 69       MI   Hypertension Child    Heart attack Child    Diabetes Child    Heart disease Maternal Aunt        x 2, all deceased   Heart disease Maternal Uncle        x 4, all deceased   Emphysema Brother 52   Colon cancer Neg Hx    Breast cancer Neg Hx    Rectal cancer Neg Hx    Stomach cancer Neg Hx     SOCIAL HISTORY: Social History   Socioeconomic History   Marital status: Widowed    Spouse name: Not on file   Number of children: 2   Years of education: Not on file   Highest education level: Not on file  Occupational History   Occupation: retired-- [employer]     Employer: RETIRED  Tobacco Use   Smoking status: Never   Smokeless tobacco: Never  Vaping Use   Vaping status: Never Used  Substance and Sexual Activity   Alcohol  use: No    Alcohol /week: 0.0 standard drinks of alcohol    Drug use: No   Sexual activity: Not Currently  Other Topics Concern   Not on file  Social History Narrative   Lives by herself, lost husband ~ 2004 , still drives    1 child in [place]   I child in [place]    Social Drivers of Corporate Investment Banker Strain: Low Risk  (09/04/2023)   Overall Financial Resource Strain (CARDIA)    Difficulty of Paying Living Expenses: Not hard at all  Food Insecurity: No Food Insecurity (09/04/2023)   Hunger Vital Sign    Worried About Running Out of Food in the Last Year: Never true    Ran Out of Food in the Last Year: Never true  Transportation Needs: No Transportation Needs (09/04/2023)   PRAPARE - Administrator, Civil Service (Medical): No    Lack of Transportation (Non-Medical): No  Physical Activity: Inactive (09/04/2023)   Exercise Vital Sign    Days of Exercise per Week: 0 days    Minutes of Exercise per Session: 0 min  Stress: No Stress Concern Present (09/04/2023)   [employer] of Occupational Health - Occupational Stress  Questionnaire    Feeling of Stress: Not at all  Social Connections: Moderately Integrated (09/04/2023)   Social Connection and Isolation Panel    Frequency of Communication with Friends and Family: More than three times a week    Frequency of Social Gatherings with Friends and Family: More than three times a week    Attends Religious Services: More than 4 times per year    Active Member of [employer] or Organizations: Yes    Attends Banker Meetings: More than 4 times per year    Marital Status: Widowed    MEDICATIONS:  Current Outpatient Medications  Medication Sig Dispense Refill   acetaminophen  (TYLENOL ) 500 MG tablet Take 1 tablet (500 mg total) by mouth every 6 (six) hours as needed. 30 tablet 0   amLODipine  (NORVASC ) 10 MG tablet Take 1 tablet (10 mg total) by mouth daily. 90 tablet 1   aspirin  81 MG chewable tablet Chew 1 tablet (81 mg total) by mouth daily.     atenolol  (TENORMIN ) 100 MG tablet Take 1 tablet (100 mg total) by mouth daily. 90 tablet 1   atorvastatin  (LIPITOR) 20 MG tablet TAKE 1 TABLET(20 MG) BY MOUTH DAILY 90 tablet 3   Azelastine  HCl 137 MCG/SPRAY SOLN Place into the nose at bedtime as needed.     Calcium  Carbonate (CALCIUM  500 PO) Take by mouth.     clopidogrel  (PLAVIX ) 75 MG tablet Take 1 tablet (75 mg total) by mouth daily. 90 tablet 1   Continuous Glucose Sensor (FREESTYLE LIBRE 3 PLUS SENSOR) MISC 1 each by Does not apply route continuous. Change every 15 days. 6 each 4   diazepam  (VALIUM ) 5 MG tablet Take 1 tablet (5 mg total) by mouth every 6 (six) hours as needed for anxiety. 20 tablet 0   fluticasone  (FLONASE ) 50 MCG/ACT nasal spray SHAKE LIQUID AND USE 2 SPRAYS IN EACH NOSTRIL DAILY 48 g 3   Glucagon  (GVOKE HYPOPEN  1-PACK) 1 MG/0.2ML SOAJ Inject 1 mg into the skin as needed (low blood sugar with impaired consciousness). 0.4 mL 2   glucose blood (ONETOUCH ULTRA) test strip USE AS DIRECTED THREE TIMES DAILY 100 strip 5   insulin  NPH Human (NOVOLIN  N) 100 UNIT/ML injection Inject 20 units into the skin every morning and inject 6 units into the skin at evening meal. 10 mL 3   insulin  regular (NOVOLIN R) 100 units/mL injection 5 units for breakfast and 3 units for supper. 10 mL 3   Insulin  Syringe-Needle U-100 (INSULIN  SYRINGE .5CC/31GX5/16) 31G X 5/16 0.5 ML MISC USE TO INJECT INSULIN  5 TIMES PER DAY 200 each 5   isosorbide  mononitrate (IMDUR ) 30 MG 24 hr tablet TAKE 1 TABLET BY MOUTH EVERY DAY 90 tablet 3   losartan  (COZAAR ) 25 MG tablet Take 1 tablet (25 mg total) by mouth daily. 90 tablet 0   metFORMIN  (GLUCOPHAGE ) 500 MG tablet Take 1 tablet (500 mg total) by mouth daily with supper. 90 tablet 1   mometasone (ELOCON) 0.1 % cream Apply 1 Application topically daily as needed (itchy ears). Use as directed     Multiple Vitamin (MULTIVITAMIN) capsule Take 1 capsule by mouth daily.  Centrum     omeprazole  (PRILOSEC) 20 MG  capsule TAKE 1 CAPSULE(20 MG) BY MOUTH DAILY 90 capsule 3   ondansetron  (ZOFRAN ) 4 MG tablet Take 1 tablet (4 mg total) by mouth every 8 (eight) hours as needed for nausea or vomiting. 20 tablet 0   Semaglutide ,0.25 or 0.5MG /DOS, (OZEMPIC , 0.25 OR 0.5 MG/DOSE,) 2 MG/3ML SOPN Inject 0.5 mg into the skin every Monday. 9 mL 1   VITAMIN D  PO Take 1 tablet by mouth daily.     Current Facility-Administered Medications  Medication Dose Route Frequency Provider Last Rate Last Admin   [START ON 01/18/2024] denosumab  (PROLIA ) injection 60 mg  60 mg Subcutaneous Q6 months Paz, Jose E, MD       Facility-Administered Medications Ordered in Other Visits  Medication Dose Route Frequency Provider Last Rate Last Admin   vancomycin  (VANCOCIN ) 1,000 mg in sodium chloride  0.9 % 500 mL IVPB  1,000 mg Intravenous Once Cornett, Thomas, MD        PHYSICAL EXAM: Vitals:   11/12/23 1410  BP: (!) 130/50  Pulse: 67  Resp: 16  SpO2: 98%  Weight: 175 lb 3.2 oz (79.5 kg)  Height: 5' 5 (1.651 m)      Body mass index is 29.15 kg/m.  Wt  Readings from Last 3 Encounters:  11/12/23 175 lb 3.2 oz (79.5 kg)  10/17/23 176 lb (79.8 kg)  10/02/23 174 lb 6 oz (79.1 kg)    General: Well developed, well nourished female in no apparent distress.  HEENT: AT/[place], no external lesions.  Eyes: Conjunctiva clear and no icterus. Neck: Neck supple  Lungs: Respirations not labored Neurologic: Alert, oriented, normal speech Extremities / Skin: Dry.    Psychiatric: Does not appear depressed or anxious  Diabetic Foot Exam - Simple   No data filed    LABS Reviewed Lab Results  Component Value Date   HGBA1C 6.4 (A) 11/12/2023   HGBA1C 6.4 (A) 08/09/2023   HGBA1C 6.2 (A) 04/30/2023   Lab Results  Component Value Date   FRUCTOSAMINE 205 05/11/2022   FRUCTOSAMINE 207 10/25/2021   FRUCTOSAMINE 230 06/28/2021   Lab Results  Component Value Date   CHOL 124 05/18/2023   HDL 41.50 05/18/2023   LDLCALC 38 05/18/2023   LDLDIRECT 38.0 07/17/2022   TRIG 220.0 (H) 05/18/2023   CHOLHDL 3 05/18/2023   Lab Results  Component Value Date   MICRALBCREAT 284.8 (H) 10/02/2023   MICRALBCREAT 281 (H) 04/30/2023   Lab Results  Component Value Date   CREATININE 1.36 (H) 10/24/2023   Lab Results  Component Value Date   GFR 35.86 (L) 10/24/2023    ASSESSMENT / PLAN  1. Type 2 diabetes mellitus with stage 3b chronic kidney disease, with long-term current use of insulin  (HCC)   2. Age-related osteoporosis without current pathological fracture     Diabetes Mellitus type 2, complicated by CKD/ CAD - Diabetic status / severity: Fair control.  Lab Results  Component Value Date   HGBA1C 6.4 (A) 11/12/2023    - Hemoglobin A1c goal : <7.5%  Due to occasional hypoglycemia I would like to decrease the insulin  dose as follows.  Diabetes regimen: Continue Novolin N 20 units in the morning and decrease from 8 units to 6 units in the evening with meals. Continue regular insulin  5 units with breakfast and decrease to 3 units with supper. 3.    Continue ozempic  0.5 mg weekly and metformin  500 mg daily.  Take glucose tablet take 2-4 tablets when glucose is low. It is over the counter.  -  Home glucose testing: Continue CGM/freestyle libre 3+, sent new prescription.  Check blood sugar as needed.  - Discussed/ Gave Hypoglycemia treatment plan.  In detail about risks of hypoglycemia.  Patient has been getting glucose tablet.  Patient lives by herself not able to use Glucagon  Emergency Kit, sent prescription for glucagon  kit as well.  Discussed that hypoglycemia can be immediately and acutely dangerous and even life-threatening.  # Consult : not required at this time.   # Annual urine for microalbuminuria/ creatinine ratio,+ microalbuminuria currently, continue ACE/ARB /losarta, restarted in October 2025.  Consider SGLT 2 inhibitor if remains high. Last  Lab Results  Component Value Date   MICRALBCREAT 284.8 (H) 10/02/2023    # Foot check nightly / neuropathy.  # Annual dilated diabetic eye exams.   2. Blood pressure  -  BP Readings from Last 1 Encounters:  11/12/23 (!) 130/50    - Control is in target.  - No change in current plans.  3. Lipid status / Hyperlipidemia - Last  Lab Results  Component Value Date   LDLCALC 38 05/18/2023   - Continue atorvastatin  20 mg daily.  Managed by primary care provider.  # Osteoporosis -Patient had recent DEXA scan in July 2024 consistent with osteoporosis based on lowest T-score of -2.7 at right hip and T-score at lumbar spine -0.7.  She had DEXA scan in 2017 with lowest T-score of -2.9 at femoral neck and T-score of -0.8 at lumbar spine consistent with osteoporosis. -She was treated with Fosamax  initially started in August 2020 and switched to Prolia .  First dose was in December 2021, she has received Prolia  every 6 months. -She has no recent fracture. -Not able to compare bone density done in 2017 and bone density in July 2024 as they were done in different places.  However overall it  seems to be stable, over the period of 7 years, despite she had received multiple doses of dexamethasone  for the treatment of CLL in last 2 years which is a risk for bone loss.  Plan: -Continue Prolia  60 mg subcutaneous every 6 months.  Last injection in July , 2025. -Continue current dose of vitamin D  and calcium . Take calcium  500 mg daily in addition to dietary calcium  and multivitamin. -Discussed about fall precautions.  Diagnoses and all orders for this visit:  Type 2 diabetes mellitus with stage 3b chronic kidney disease, with long-term current use of insulin  (HCC) -     POCT glycosylated hemoglobin (Hb A1C) -     Discontinue: Insulin  Syringe-Needle U-100 (INSULIN  SYRINGE .5CC/31GX5/16) 31G X 5/16 0.5 ML MISC; USE TO INJECT INSULIN  5 TIMES PER DAY -     insulin  regular (NOVOLIN R) 100 units/mL injection; 5 units for breakfast and 3 units for supper. -     Insulin  Syringe-Needle U-100 (INSULIN  SYRINGE .5CC/31GX5/16) 31G X 5/16 0.5 ML MISC; USE TO INJECT INSULIN  5 TIMES PER DAY -     insulin  NPH Human (NOVOLIN N) 100 UNIT/ML injection; Inject 20 units into the skin every morning and inject 6 units into the skin at evening meal.  Age-related osteoporosis without current pathological fracture    DISPOSITION Follow up in clinic in 3 months suggested.  Labs on the same day of the visit.   All questions answered and patient verbalized understanding of the plan.  Auther Lyerly, MD [facility] Endocrinology [facility] [address] [place], [address] [place], [place] [address] Phone # [phone]  At least part of this note was generated  using voice recognition software. Inadvertent word errors may have occurred, which were not recognized during the proofreading process.

## 2023-11-12 NOTE — Patient Instructions (Signed)
 Diabetes regimen: Novolin N 20 units in the morning and 6 units in the evening with meals. Decrease night dose of Novolin N to 6 units.  Regular insulin  5 units with breakfast and decrease to 3 units for supper.  3.   Continue ozempic  and metformin  same.  Take glucose tablet take 2-4 tablets when glucose is low. It is over the counter.

## 2023-11-14 DIAGNOSIS — M5416 Radiculopathy, lumbar region: Secondary | ICD-10-CM | POA: Diagnosis not present

## 2023-11-14 DIAGNOSIS — M545 Low back pain, unspecified: Secondary | ICD-10-CM | POA: Diagnosis not present

## 2023-11-20 DIAGNOSIS — H43813 Vitreous degeneration, bilateral: Secondary | ICD-10-CM | POA: Diagnosis not present

## 2023-11-20 DIAGNOSIS — H35432 Paving stone degeneration of retina, left eye: Secondary | ICD-10-CM | POA: Diagnosis not present

## 2023-11-20 DIAGNOSIS — E113313 Type 2 diabetes mellitus with moderate nonproliferative diabetic retinopathy with macular edema, bilateral: Secondary | ICD-10-CM | POA: Diagnosis not present

## 2023-11-20 DIAGNOSIS — Z961 Presence of intraocular lens: Secondary | ICD-10-CM | POA: Diagnosis not present

## 2023-11-20 DIAGNOSIS — Q141 Congenital malformation of retina: Secondary | ICD-10-CM | POA: Diagnosis not present

## 2023-11-21 DIAGNOSIS — D0512 Intraductal carcinoma in situ of left breast: Secondary | ICD-10-CM | POA: Diagnosis not present

## 2023-11-22 DIAGNOSIS — E113312 Type 2 diabetes mellitus with moderate nonproliferative diabetic retinopathy with macular edema, left eye: Secondary | ICD-10-CM | POA: Diagnosis not present

## 2023-11-28 ENCOUNTER — Other Ambulatory Visit: Payer: Self-pay | Admitting: Hematology & Oncology

## 2023-12-04 DIAGNOSIS — M5416 Radiculopathy, lumbar region: Secondary | ICD-10-CM | POA: Diagnosis not present

## 2023-12-04 DIAGNOSIS — M545 Low back pain, unspecified: Secondary | ICD-10-CM | POA: Diagnosis not present

## 2023-12-14 DIAGNOSIS — M5416 Radiculopathy, lumbar region: Secondary | ICD-10-CM | POA: Diagnosis not present

## 2023-12-14 DIAGNOSIS — M545 Low back pain, unspecified: Secondary | ICD-10-CM | POA: Diagnosis not present

## 2023-12-18 DIAGNOSIS — M5416 Radiculopathy, lumbar region: Secondary | ICD-10-CM | POA: Diagnosis not present

## 2023-12-18 DIAGNOSIS — M545 Low back pain, unspecified: Secondary | ICD-10-CM | POA: Diagnosis not present

## 2023-12-20 ENCOUNTER — Other Ambulatory Visit: Payer: Self-pay | Admitting: Endocrinology

## 2024-01-05 ENCOUNTER — Other Ambulatory Visit: Payer: Self-pay | Admitting: Internal Medicine

## 2024-01-10 ENCOUNTER — Telehealth: Payer: Self-pay

## 2024-01-10 NOTE — Telephone Encounter (Signed)
 Prolia  VOB initiated via [email or web address]  Next Prolia  inj DUE: 01/23/24

## 2024-01-14 NOTE — Telephone Encounter (Signed)
 Patient called in to ask about prolia  injection appointment. She thought she was scheduled for 01/18/2024. However there is no appointment schedule for her.She would like to know when she is due? Please contact patient.

## 2024-01-14 NOTE — Telephone Encounter (Signed)
 Pt notified that we are just waiting on authorization and she is due on or after 01/24/24.  Advised that she can possibly get it when she is here on 02/01/24.

## 2024-01-15 ENCOUNTER — Other Ambulatory Visit (HOSPITAL_COMMUNITY): Payer: Self-pay

## 2024-01-15 NOTE — Telephone Encounter (Signed)
 Pt ready for scheduling for PROLIA  on or after : 01/23/24  Option# 1: Buy/Bill (Office supplied medication)  Out-of-pocket cost due at time of clinic visit: $0  Number of injection/visits approved: ---  Primary: MEDICARE Prolia  co-insurance: 0% Admin fee co-insurance: 0%  Secondary: UHC-MEDSUP Prolia  co-insurance:  covers the Medicare Part B deductible, co-insurance and 100% of the excess charges Admin fee co-insurance:   Medical Benefit Details: Date Benefits were checked: 01/10/24 Deductible: $0 Met of $283 Required/ Coinsurance: 0%/ Admin Fee: 0%  Prior Auth: N/A PA# Expiration Date:   # of doses approved: ----------------------------------------------------------------------- Option# 2- Med Obtained from pharmacy: [facility]REFERRED FOR PHARMACY BENEFIT  Pharmacy benefit: Copay $691.67 (Paid to pharmacy) Admin Fee: 0% (Pay at clinic)  Prior Auth: N/A PA# Expiration Date:   # of doses approved:   If patient wants fill through the pharmacy benefit please send prescription to: [facility], and include estimated need by date in rx notes. Pharmacy will ship medication directly to the office.  Patient NOT eligible for Prolia  Copay Card. Copay Card can make patient's cost as little as $25. Link to apply: [email or web address]://[email or web address]  ** This summary of benefits is an estimation of the patient's out-of-pocket cost. Exact cost may very based on individual plan coverage.

## 2024-01-15 NOTE — Telephone Encounter (Signed)
 SABRA

## 2024-01-16 ENCOUNTER — Ambulatory Visit: Payer: Self-pay | Admitting: Hematology & Oncology

## 2024-01-16 ENCOUNTER — Inpatient Hospital Stay (HOSPITAL_BASED_OUTPATIENT_CLINIC_OR_DEPARTMENT_OTHER): Admitting: Hematology & Oncology

## 2024-01-16 ENCOUNTER — Other Ambulatory Visit: Payer: Self-pay

## 2024-01-16 ENCOUNTER — Inpatient Hospital Stay: Attending: Hematology & Oncology

## 2024-01-16 ENCOUNTER — Encounter: Payer: Self-pay | Admitting: Hematology & Oncology

## 2024-01-16 VITALS — BP 170/61 | HR 67 | Temp 98.0°F | Resp 18 | Ht 65.0 in | Wt 170.0 lb

## 2024-01-16 DIAGNOSIS — C9110 Chronic lymphocytic leukemia of B-cell type not having achieved remission: Secondary | ICD-10-CM | POA: Diagnosis not present

## 2024-01-16 DIAGNOSIS — D508 Other iron deficiency anemias: Secondary | ICD-10-CM

## 2024-01-16 DIAGNOSIS — K909 Intestinal malabsorption, unspecified: Secondary | ICD-10-CM

## 2024-01-16 LAB — CBC WITH DIFFERENTIAL (CANCER CENTER ONLY)
Abs Immature Granulocytes: 0.02 K/uL (ref 0.00–0.07)
Basophils Absolute: 0 K/uL (ref 0.0–0.1)
Basophils Relative: 0 %
Eosinophils Absolute: 0.2 K/uL (ref 0.0–0.5)
Eosinophils Relative: 2 %
HCT: 37.4 % (ref 36.0–46.0)
Hemoglobin: 12.7 g/dL (ref 12.0–15.0)
Immature Granulocytes: 0 %
Lymphocytes Relative: 29 %
Lymphs Abs: 2.3 K/uL (ref 0.7–4.0)
MCH: 32.2 pg (ref 26.0–34.0)
MCHC: 34 g/dL (ref 30.0–36.0)
MCV: 94.9 fL (ref 80.0–100.0)
Monocytes Absolute: 0.6 K/uL (ref 0.1–1.0)
Monocytes Relative: 8 %
Neutro Abs: 4.9 K/uL (ref 1.7–7.7)
Neutrophils Relative %: 61 %
Platelet Count: 219 K/uL (ref 150–400)
RBC: 3.94 MIL/uL (ref 3.87–5.11)
RDW: 13 % (ref 11.5–15.5)
WBC Count: 8 K/uL (ref 4.0–10.5)
nRBC: 0 % (ref 0.0–0.2)

## 2024-01-16 LAB — CMP (CANCER CENTER ONLY)
ALT: 20 U/L (ref 0–44)
AST: 26 U/L (ref 15–41)
Albumin: 4.6 g/dL (ref 3.5–5.0)
Alkaline Phosphatase: 94 U/L (ref 38–126)
Anion gap: 13 (ref 5–15)
BUN: 19 mg/dL (ref 8–23)
CO2: 24 mmol/L (ref 22–32)
Calcium: 9.9 mg/dL (ref 8.9–10.3)
Chloride: 106 mmol/L (ref 98–111)
Creatinine: 1.1 mg/dL — ABNORMAL HIGH (ref 0.44–1.00)
GFR, Estimated: 49 mL/min — ABNORMAL LOW
Glucose, Bld: 192 mg/dL — ABNORMAL HIGH (ref 70–99)
Potassium: 4 mmol/L (ref 3.5–5.1)
Sodium: 143 mmol/L (ref 135–145)
Total Bilirubin: 0.3 mg/dL (ref 0.0–1.2)
Total Protein: 7.3 g/dL (ref 6.5–8.1)

## 2024-01-16 LAB — FERRITIN: Ferritin: 405 ng/mL — ABNORMAL HIGH (ref 11–307)

## 2024-01-16 LAB — SAVE SMEAR(SSMR), FOR PROVIDER SLIDE REVIEW

## 2024-01-16 LAB — IRON AND IRON BINDING CAPACITY (CC-WL,HP ONLY)
Iron: 62 ug/dL (ref 28–170)
Saturation Ratios: 20 % (ref 10.4–31.8)
TIBC: 309 ug/dL (ref 250–450)
UIBC: 248 ug/dL

## 2024-01-16 LAB — LACTATE DEHYDROGENASE: LDH: 218 U/L (ref 105–235)

## 2024-01-16 NOTE — Telephone Encounter (Signed)
-----   Message from Maude Crease, MD sent at 01/16/2024  3:43 PM EST ----- Please call and let her know that the iron is borderline low.  I think she probably needs to have a dose of iron so that we do not see the iron get down to low.  Please set this up.  Thanks.  Jeralyn ----- Message ----- From: Franchot Lauraine HERO, NP Sent: 01/16/2024   3:01 PM EST To: Maude JONELLE Crease, MD

## 2024-01-16 NOTE — Progress Notes (Signed)
 " Hematology and Oncology Follow Up Visit  [name] [id number] 28-Nov-1940 84 y.o. 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  We last saw her back in October.  [name] had a decent Holiday season.  I know this is a little bit difficult for her because her son passed away earlier in the year.  [name] feels okay.  Her iron studies are going down a little bit.  Today, her ferritin is 405 with an iron saturation of 20%.  I think [name] probably would benefit from some IV iron.  [name] has had no problems with her bowels or bladder.  I know that [name] has had in the past C. difficile colitis.  [name] does have diabetes.  I told her that her kidney function will be tied directly to her blood sugars.  If her blood sugars are under good control then her kidney function will improve.   Thankfully, [name] has not had any problems with Influenza or COVID.  Currently, I will say that her performance status is probably ECOG 1.    Medications:  Allergies as of 01/16/2024       Reactions   Codeine Other (See Comments)   REACTION: makes her nervous, orTylenol #3   Ibuprofen Other (See Comments)   REACTION: nervous   Meperidine  Hcl Nausea And Vomiting   Naproxen Sodium Other (See Comments)   Or Advil REACTION: nervous   Tramadol  Other (See Comments)   Insomnia    Tylenol  With Codeine #3 [acetaminophen -codeine] Nausea And Vomiting        Medication List        Accurate as of January 16, 2024 10:42  AM. If you have any questions, ask your nurse or doctor.          STOP taking these medications    diazepam  5 MG tablet Commonly known as: VALIUM  Stopped by: Maude Crease, MD       TAKE these medications    acetaminophen  500 MG tablet Commonly known as: TYLENOL  Take 1 tablet (500 mg total) by mouth every 6 (six) hours as needed.   amLODipine  10 MG tablet Commonly known as: NORVASC  Take 1 tablet (10 mg total) by mouth daily.   aspirin  81 MG chewable tablet Chew 1 tablet (81 mg total) by mouth daily.   atenolol  100 MG tablet Commonly known as: TENORMIN  Take 1 tablet (100 mg total) by mouth daily.   atorvastatin  20 MG tablet Commonly known as: LIPITOR TAKE 1 TABLET(20 MG) BY MOUTH DAILY   Azelastine  HCl 137 MCG/SPRAY Soln Place into the nose at bedtime as needed.   CALCIUM  500 PO Take by mouth.   clopidogrel  75 MG tablet Commonly known as: PLAVIX  Take 1 tablet (75 mg total) by mouth daily.   fluticasone  50 MCG/ACT nasal spray Commonly known as: FLONASE  SHAKE LIQUID AND USE 2 SPRAYS IN EACH NOSTRIL DAILY   FreeStyle Libre 3 Plus Sensor Misc 1 each by Does not apply  route continuous. Change every 15 days.   Gvoke HypoPen  1-Pack 1 MG/0.2ML Soaj Generic drug: Glucagon  Inject 1 mg into the skin as needed (low blood sugar with impaired consciousness).   insulin  NPH Human 100 UNIT/ML injection Commonly known as: NOVOLIN N Inject 20 units into the skin every morning and inject 6 units into the skin at evening meal.   insulin  regular 100 units/mL injection Commonly known as: NOVOLIN R 5 units for breakfast and 3 units for supper.   INSULIN  SYRINGE .5CC/31GX5/16 31G X 5/16 0.5 ML Misc USE TO INJECT INSULIN  5 TIMES PER DAY   isosorbide  mononitrate 30 MG 24 hr tablet Commonly known as: IMDUR  TAKE 1 TABLET BY MOUTH EVERY DAY   losartan  25 MG tablet Commonly known as: COZAAR  Take 1 tablet (25 mg total) by mouth daily.   metFORMIN  500 MG tablet Commonly  known as: GLUCOPHAGE  TAKE 1 TABLET(500 MG) BY MOUTH DAILY WITH SUPPER   mometasone 0.1 % cream Commonly known as: ELOCON Apply 1 Application topically daily as needed (itchy ears). Use as directed   multivitamin capsule Take 1 capsule by mouth daily.  Centrum   omeprazole  20 MG capsule Commonly known as: PRILOSEC TAKE 1 CAPSULE(20 MG) BY MOUTH DAILY   ondansetron  4 MG tablet Commonly known as: Zofran  Take 1 tablet (4 mg total) by mouth every 8 (eight) hours as needed for nausea or vomiting.   OneTouch Ultra test strip Generic drug: glucose blood USE AS DIRECTED THREE TIMES DAILY   Ozempic  (0.25 or 0.5 MG/DOSE) 2 MG/3ML Sopn Generic drug: Semaglutide (0.25 or 0.5MG /DOS) Inject 0.5 mg into the skin every Monday.   VITAMIN D  PO Take 1 tablet by mouth daily.        Allergies:  Allergies  Allergen Reactions   Codeine Other (See Comments)    REACTION: makes her nervous, orTylenol #3   Ibuprofen Other (See Comments)    REACTION: nervous   Meperidine  Hcl Nausea And Vomiting   Naproxen Sodium Other (See Comments)    Or Advil  REACTION: nervous   Tramadol  Other (See Comments)    Insomnia    Tylenol  With Codeine #3 [Acetaminophen -Codeine] Nausea And Vomiting    Past Medical History, Surgical history, Social history, and Family History were reviewed and updated.  Review of Systems:  Review of Systems  Constitutional: Negative.   HENT: Negative.    Eyes: Negative.   Respiratory: Negative.    Cardiovascular:  Positive for leg swelling.  Gastrointestinal: Negative.   Genitourinary: Negative.   Musculoskeletal: Negative.   Skin: Negative.   Neurological: Negative.   Endo/Heme/Allergies: Negative.   Psychiatric/Behavioral: Negative.       Physical Exam:  height is 5' 5 (1.651 m) and weight is 170 lb (77.1 kg). Her oral temperature is 98 F (36.7 C). Her blood pressure is 170/61 (abnormal) and her pulse is 67. Her respiration is 18 and oxygen saturation is 100%.    Wt Readings from Last 3 Encounters:  01/16/24 170 lb (77.1 kg)  11/12/23 175 lb 3.2 oz (79.5 kg)  10/17/23 176 lb (79.8 kg)    Physical Exam Vitals reviewed.  HENT:     Head: Normocephalic and atraumatic.  Eyes:     Pupils: Pupils are equal, round, and reactive to light.  Cardiovascular:     Rate and Rhythm: Normal rate and regular rhythm.     Heart sounds: Normal heart sounds.  Pulmonary:     Effort: Pulmonary effort is normal.     Breath sounds:  Normal breath sounds.  Abdominal:     General: Bowel sounds are normal.     Palpations: Abdomen is soft.  Musculoskeletal:        General: No tenderness or deformity. Normal range of motion.     Cervical back: Normal range of motion.  Lymphadenopathy:     Cervical: No cervical adenopathy.  Skin:    General: Skin is warm and dry.     Findings: No erythema or rash.  Neurological:     Mental Status: [name] is alert and oriented to person, place, and time.  Psychiatric:        Behavior: Behavior normal.        Thought Content: Thought content normal.        Judgment: Judgment normal.      Lab Results  Component Value Date   WBC 8.0 01/16/2024   HGB 12.7 01/16/2024   HCT 37.4 01/16/2024   MCV 94.9 01/16/2024   PLT 219 01/16/2024   Lab Results  Component Value Date   FERRITIN 467 (H) 10/17/2023   IRON 83 10/17/2023   TIBC 309 10/17/2023   UIBC 226 10/17/2023   IRONPCTSAT 27 10/17/2023   Lab Results  Component Value Date   RETICCTPCT 2.3 03/20/2023   RBC 3.94 01/16/2024   Lab Results  Component Value Date   KPAFRELGTCHN 32.8 (H) 05/16/2021   LAMBDASER 26.4 (H) 05/16/2021   KAPLAMBRATIO 1.24 05/16/2021   Lab Results  Component Value Date   IGGSERUM 845 08/22/2022   IGA 185 08/22/2022   IGMSERUM 37 08/22/2022   Lab Results  Component Value Date   TOTALPROTELP 6.5 11/18/2020   ALBUMINELP 3.6 11/18/2020   A1GS 0.3 11/18/2020   A2GS 0.9 11/18/2020   BETS 1.0 11/18/2020   BETA2SER 6.2 02/25/2014   GAMS 0.7  11/18/2020   MSPIKE Not Observed 11/18/2020   SPEI * 02/25/2014     Chemistry      Component Value Date/Time   NA 143 01/16/2024 0912   NA 147 (H) 11/03/2016 1018   NA 143 08/30/2016 0954   K 4.0 01/16/2024 0912   K 3.9 11/03/2016 1018   K 4.5 08/30/2016 0954   CL 106 01/16/2024 0912   CL 110 (H) 11/03/2016 1018   CO2 24 01/16/2024 0912   CO2 27 11/03/2016 1018   CO2 24 08/30/2016 0954   BUN 19 01/16/2024 0912   BUN 25 (H) 11/03/2016 1018   BUN 20.5 08/30/2016 0954   CREATININE 1.10 (H) 01/16/2024 0912   CREATININE 1.13 (H) 04/13/2023 1523   CREATININE 1.4 (H) 08/30/2016 0954      Component Value Date/Time   CALCIUM  9.9 01/16/2024 0912   CALCIUM  10.1 11/03/2016 1018   CALCIUM  9.8 08/30/2016 0954   ALKPHOS 94 01/16/2024 0912   ALKPHOS 112 (H) 11/03/2016 1018   ALKPHOS 113 08/30/2016 0954   AST 26 01/16/2024 0912   AST 23 08/30/2016 0954   ALT 20 01/16/2024 0912   ALT 29 11/03/2016 1018   ALT 20 08/30/2016 0954   BILITOT 0.3 01/16/2024 0912   BILITOT 0.38 08/30/2016 0954       Impression and Plan: [name] is a very pleasant 84 yo caucasian female with CLL.  So far, we have not had any negative prognostic markers.  [name] does have the 13q-chromosomal abnormality.    Right now, [name] is off all therapy.  [name] has been off therapy now probably for about 9 months.    Her last flow cytometry on  the peripheral blood back in May did not show any monoclonal B cells in the blood.    I looked at her blood smear.  I do not see anything that looked suspicious.  For right now, I will plan to get her back to see me in about 3 months.   Maude JONELLE Crease, MD 1/14/202610:42 AM  "

## 2024-01-16 NOTE — Telephone Encounter (Signed)
 Pt advised of results and states understanding. Advised her scheduling will call to set her up for 1 dose of IV iron. Message sent.

## 2024-01-21 ENCOUNTER — Other Ambulatory Visit: Payer: Self-pay | Admitting: Internal Medicine

## 2024-01-25 ENCOUNTER — Inpatient Hospital Stay

## 2024-01-25 ENCOUNTER — Other Ambulatory Visit: Payer: Self-pay | Admitting: Family

## 2024-01-25 ENCOUNTER — Other Ambulatory Visit: Payer: Self-pay | Admitting: Hematology & Oncology

## 2024-01-25 VITALS — BP 131/52 | HR 64 | Temp 97.6°F | Resp 19

## 2024-01-25 DIAGNOSIS — D508 Other iron deficiency anemias: Secondary | ICD-10-CM

## 2024-01-25 MED ORDER — SODIUM CHLORIDE 0.9 % IV SOLN
1000.0000 mg | Freq: Once | INTRAVENOUS | Status: AC
  Administered 2024-01-25: 1000 mg via INTRAVENOUS
  Filled 2024-01-25: qty 10

## 2024-01-25 NOTE — Patient Instructions (Signed)

## 2024-01-31 ENCOUNTER — Telehealth: Payer: Self-pay

## 2024-01-31 NOTE — Telephone Encounter (Signed)
 Copied from CRM #[id number]. Topic: Appointments - Appointment Cancel/Reschedule >> Jan 31, 2024  8:47 AM [name] wrote: Patient rescheduled appt from 02/01/24 to 02/15/24 due to the weather - she wants to know if it's okay to wait until 2/13 for the prolia  injection? Or if she should have it sooner?

## 2024-01-31 NOTE — Telephone Encounter (Signed)
 Okay for Prolia  in Feb at next visit.

## 2024-02-01 ENCOUNTER — Ambulatory Visit: Admitting: Internal Medicine

## 2024-02-12 ENCOUNTER — Ambulatory Visit: Admitting: Endocrinology

## 2024-02-15 ENCOUNTER — Ambulatory Visit: Admitting: Internal Medicine

## 2024-04-28 ENCOUNTER — Inpatient Hospital Stay: Admitting: Hematology & Oncology

## 2024-04-28 ENCOUNTER — Inpatient Hospital Stay

## 2024-09-16 ENCOUNTER — Ambulatory Visit
